# Patient Record
Sex: Female | Born: 1960 | Race: White | Hispanic: No | Marital: Single | Smoking: Never smoker
Health system: Southern US, Community
[De-identification: ages and names within clinical notes are randomized; demographics above are authoritative.]

## PROBLEM LIST (undated history)

## (undated) DIAGNOSIS — E119 Type 2 diabetes mellitus without complications: Secondary | ICD-10-CM

## (undated) DIAGNOSIS — J45909 Unspecified asthma, uncomplicated: Secondary | ICD-10-CM

## (undated) DIAGNOSIS — J302 Other seasonal allergic rhinitis: Secondary | ICD-10-CM

## (undated) DIAGNOSIS — C50919 Malignant neoplasm of unspecified site of unspecified female breast: Secondary | ICD-10-CM

## (undated) HISTORY — PX: KIDNEY STONE SURGERY: SHX686

## (undated) HISTORY — PX: WISDOM TOOTH EXTRACTION: SHX21

## (undated) HISTORY — PX: SINOSCOPY: SHX187

---

## 1999-11-28 ENCOUNTER — Other Ambulatory Visit: Admission: RE | Admit: 1999-11-28 | Discharge: 1999-11-28 | Payer: Self-pay | Admitting: Obstetrics and Gynecology

## 2000-12-05 ENCOUNTER — Other Ambulatory Visit: Admission: RE | Admit: 2000-12-05 | Discharge: 2000-12-05 | Payer: Self-pay | Admitting: Obstetrics and Gynecology

## 2001-08-11 ENCOUNTER — Encounter: Payer: Self-pay | Admitting: Otolaryngology

## 2001-08-11 ENCOUNTER — Encounter: Admission: RE | Admit: 2001-08-11 | Discharge: 2001-08-11 | Payer: Self-pay | Admitting: Otolaryngology

## 2001-08-27 ENCOUNTER — Ambulatory Visit (HOSPITAL_BASED_OUTPATIENT_CLINIC_OR_DEPARTMENT_OTHER): Admission: RE | Admit: 2001-08-27 | Discharge: 2001-08-27 | Payer: Self-pay | Admitting: Otolaryngology

## 2010-07-26 ENCOUNTER — Ambulatory Visit (HOSPITAL_BASED_OUTPATIENT_CLINIC_OR_DEPARTMENT_OTHER): Payer: BLUE CROSS/BLUE SHIELD

## 2010-08-15 ENCOUNTER — Ambulatory Visit (HOSPITAL_BASED_OUTPATIENT_CLINIC_OR_DEPARTMENT_OTHER): Payer: BLUE CROSS/BLUE SHIELD | Attending: Allergy and Immunology

## 2010-08-15 DIAGNOSIS — G4733 Obstructive sleep apnea (adult) (pediatric): Secondary | ICD-10-CM | POA: Insufficient documentation

## 2010-08-19 DIAGNOSIS — G4733 Obstructive sleep apnea (adult) (pediatric): Secondary | ICD-10-CM

## 2010-08-20 NOTE — Procedures (Signed)
NAME:  Stacy Shelton, Stacy Shelton NO.:  [id number]  MEDICAL RECORD NO.:  [id number]          PATIENT TYPE:  OUT  LOCATION:  SLEEP CENTER                 FACILITY:  [facility]  PHYSICIAN:  Janiel Crisostomo D. Maple Hudson, MD, FCCP, FACPDATE OF BIRTH:  Jul 15[birth year] 1960  DATE OF STUDY:  08/15/2010                           NOCTURNAL POLYSOMNOGRAM  REFERRING PHYSICIAN:  ERIC J KOZLOW  INDICATION FOR STUDY:  Hypersomnia with sleep apnea.  EPWORTH SLEEPINESS SCORE:  13/24.  BMI 33.8.  Weight 191 pounds, height 63 inches.  Neck 15 inches.  MEDICATIONS:  Home medications are charted and reviewed.  SLEEP ARCHITECTURE:  Split study protocol.  During the diagnostic phase, total sleep time 141.5 minutes with sleep efficiency 86%.  Stage I was 7.8%, stage II 92.2%, stages III and REM were absent.  Sleep latency 3 minutes, awake after sleep onset 12 minutes, arousal index 27.1.  BEDTIME MEDICATION:  None.  RESPIRATORY DATA:  Split study protocol.  Apnea/hypopnea index (AHI) 18.2 per hour.  A total of 43 events was scored all as hypopneas and most associated with supine sleep position.  CPAP was then titrated to 10 cwp, AHI 0 per hour.  She wore a small ResMed Quattro FX full-face mask with heated humidifier and C-Flex setting of 2.  OXYGEN DATA:  Before CPAP snoring was moderate to occasionally loud with oxygen desaturation to a nadir of 90%.  With CPAP titration mean oxygen saturation held 96.9% on room air and snoring was prevented.  CARDIAC DATA:  Sinus rhythm with PACs and PVCs.  MOVEMENT-PARASOMNIA:  No significant movement disturbance.  Bathroom times one.  IMPRESSION-RECOMMENDATIONS: 1. Moderate obstructive sleep apnea/hypopnea syndrome, AHI 18.2 per     hour, events were hypopneas associated with supine sleep position     and moderate to loud snoring with oxygen desaturation to a nadir of     90% for CPAP on room air. 2. Successful CPAP titration to 10 cwp, AHI 0 per hour.  She wore a  small ResMed Quattro FX full-face mask with heated humidifier at a     C-Flex setting of 2.  Snoring was prevented and oxygenation     improved.     Irbin Fines D. Maple Hudson, MD, [facility], FACP Diplomate, Biomedical engineer of Sleep Medicine Electronically Signed    CDY/MEDQ  D:  08/19/2010 13:20:07  T:  08/19/2010 21:47:57  Job:  [id number]

## 2015-02-21 ENCOUNTER — Other Ambulatory Visit: Payer: Self-pay | Admitting: Neurology

## 2015-02-21 MED ORDER — MONTELUKAST SODIUM 10 MG PO TABS: 10.0000 mg | ORAL_TABLET | Freq: Every day | ORAL | Status: DC

## 2015-04-25 ENCOUNTER — Other Ambulatory Visit: Payer: Self-pay

## 2015-04-26 ENCOUNTER — Other Ambulatory Visit: Payer: Self-pay

## 2015-04-26 MED ORDER — MONTELUKAST SODIUM 10 MG PO TABS: 10.0000 mg | ORAL_TABLET | Freq: Every day | ORAL | Status: DC

## 2015-05-06 ENCOUNTER — Encounter: Payer: Self-pay | Admitting: Allergy and Immunology

## 2015-05-06 ENCOUNTER — Ambulatory Visit (INDEPENDENT_AMBULATORY_CARE_PROVIDER_SITE_OTHER): Payer: BLUE CROSS/BLUE SHIELD | Admitting: Allergy and Immunology

## 2015-05-06 VITALS — BP 130/75 | HR 80 | Temp 98.5°F | Resp 18

## 2015-05-06 DIAGNOSIS — J4531 Mild persistent asthma with (acute) exacerbation: Secondary | ICD-10-CM

## 2015-05-06 DIAGNOSIS — J309 Allergic rhinitis, unspecified: Secondary | ICD-10-CM | POA: Diagnosis not present

## 2015-05-06 DIAGNOSIS — H1010 Acute atopic conjunctivitis, unspecified eye: Secondary | ICD-10-CM

## 2015-05-06 MED ORDER — AZITHROMYCIN 250 MG PO TABS: ORAL_TABLET | ORAL | Status: DC

## 2015-05-06 NOTE — Progress Notes (Signed)
FOLLOW UP NOTE  RE: FOREST OTEY MRN: [id number] DOB: [birth year] [facility] [place] [address] [id number] Date of Office Visit: 05/06/2015  Subjective:  Stacy Shelton is a 55 y.o. female who presents today for Cough; Nasal Congestion; and Sore Throat  Assessment:   1. Allergic rhinoconjunctivitis, associated postnasal drip.   2. Mild persistent asthma, with acute exacerbation, responsive to bronchodilator with clear lung exam and normal oxygenation.    3.      Recent respiratory infection, afebrile in no respiratory distress with known sick contacts. Plan:   Meds ordered this encounter  Medications  . azithromycin (ZITHROMAX Z-PAK) 250 MG tablet    Sig: Take 2 tablets today, then take one tablet on day 2, one tablet on day 3, one tablet on day 4, and one tablet on day 5.    Dispense:  6 each    Refill:  0   Patient Instructions  1. Begin prednisone  30 mg now and complete 30mg  over the next  3 days, then 20 mg one day and 10 mg on the last day. 2.  Consistently use Qvar 80 g 2 Puffs 3 times daily, for the next 10 days, then decrease to twice daily--until empty, then return to Qvar 40 g. 3.  Continue Singulair daily. 4.  Saline nasal wash once to twice daily. 5.  Rhinocort one spray once daily. 6.  Complete 5 days of azithromycin. 7.  Pro-air HFA every 4 hours as needed. 8.  Follow-up in  1-2  Month(s) or sooner if needed.  HPI: Stacy Shelton returns to the office with cough, congestion, postnasal drip, rhinorrhea and intermittent wheeze over the last week.  She notices intermittent clear phlegm production without headache, fever, sore throat, muscle aches, GI symptoms, shortness of breath or difficulty breathing.  She had received influenza vaccine but recalls recent sick contacts.  Denies any recurring difficulty since her last visit July 2016 including the any need for Qvar.  She only restarted the Qvar in the last 2 weeks one puff twice  daily without nasal spray or antihistamine given the season.  She denies any current reflux and has been consistent with those medications.  Denies ED or urgent care visits, prednisone or antibiotic courses. Reports sleep and activity are normal.  Stacy Shelton has a current medication list which includes the following prescription(s): albuterol, esomeprazole, montelukast, qvar, ranitidine, tobramycin.   Drug Allergies: Allergies  Allergen Reactions  . Nsaids    Objective:   Filed Vitals:   05/06/15 0853  BP: 130/75  Pulse: 80  Temp: 98.5 F (36.9 C)  Resp: 18   SpO2 Readings from Last 1 Encounters:  05/06/15 98%   Physical Exam  Constitutional: She is well-developed, well-nourished, and in no distress.  Communicating easily in full sentences with intermittent cough.  HENT:  Head: Atraumatic.  Right Ear: Tympanic membrane and ear canal normal.  Left Ear: Tympanic membrane and ear canal normal.  Nose: Mucosal edema and rhinorrhea (Scant clear mucus, noted septal perforation.) present. No epistaxis.  Mouth/Throat: Oropharynx is clear and moist and mucous membranes are normal. No oropharyngeal exudate, posterior oropharyngeal edema or posterior oropharyngeal erythema.  Neck: Neck supple.  Cardiovascular: Normal rate, S1 normal and S2 normal.   No murmur heard. Pulmonary/Chest: Effort normal. No accessory muscle usage. No respiratory distress. She has no wheezes (Post-Xopenex/Atrovent neb: Continues to be clear without adventitious breath sounds.  Patient reports improved). She has no rhonchi. She  has no rales.  Lymphadenopathy:    She has no cervical adenopathy.   Diagnostics: Spirometry:  FVC 2.39--80%, FEV1 2.12--87%; postbronchodilator improvement, FVC 2.77--93%, FEV1 2.46--101%.    Keval Nam M. Ishmael Holter, MD   cc: No PCP Per Patient

## 2015-05-06 NOTE — Patient Instructions (Signed)
   Begin prednisone  30 mg now and complete 30mg  over the next  3 days, then 20 mg one day and 10 mg on the last day.  Consistently use Qvar 80 g 2 Puffs 3 times daily, for the next 10 days, then decrease to twice daily.  Continue Singulair daily.  Saline nasal wash once to twice daily.  Rhinocort one spray once daily.  Complete 5 days of azithromycin.  Pro-air HFA every 4 hours as needed.  Follow-up in  1-2  Month(s) or sooner if needed.

## 2015-05-09 NOTE — Addendum Note (Signed)
Addended by: Angelica Ran on: 05/09/2015 12:14 PM   Modules accepted: Orders, SmartSet

## 2015-07-06 ENCOUNTER — Ambulatory Visit (INDEPENDENT_AMBULATORY_CARE_PROVIDER_SITE_OTHER): Payer: BLUE CROSS/BLUE SHIELD | Admitting: Allergy and Immunology

## 2015-07-06 ENCOUNTER — Encounter: Payer: Self-pay | Admitting: Allergy and Immunology

## 2015-07-06 VITALS — BP 118/70 | HR 84 | Temp 97.8°F | Resp 20 | Ht 62.0 in | Wt 175.3 lb

## 2015-07-06 DIAGNOSIS — J309 Allergic rhinitis, unspecified: Secondary | ICD-10-CM | POA: Diagnosis not present

## 2015-07-06 DIAGNOSIS — H1010 Acute atopic conjunctivitis, unspecified eye: Secondary | ICD-10-CM

## 2015-07-06 DIAGNOSIS — J4540 Moderate persistent asthma, uncomplicated: Secondary | ICD-10-CM | POA: Diagnosis not present

## 2015-07-06 MED ORDER — MONTELUKAST SODIUM 10 MG PO TABS: 10.0000 mg | ORAL_TABLET | Freq: Every day | ORAL | Status: DC

## 2015-07-06 MED ORDER — ALBUTEROL SULFATE HFA 108 (90 BASE) MCG/ACT IN AERS: 2.0000 | INHALATION_SPRAY | Freq: Four times a day (QID) | RESPIRATORY_TRACT | Status: DC | PRN

## 2015-07-06 NOTE — Progress Notes (Signed)
     FOLLOW UP NOTE  RE: Stacy Shelton MRN: [id number] DOB: 03/06/1960 [facility] [place] [address] [address] Date of Office Visit: 07/06/2015  Subjective:  Stacy Shelton is a 55 y.o. female who presents today for Asthma  Assessment:   1. Moderate persistent asthma.  2. Allergic rhinoconjunctivitis.   3.      History of GEreflux and likely reflux-induced respiratory disease, on daily PPI and H2 blocker. Plan:   Meds ordered this encounter  Medications  . albuterol (PROAIR HFA) 108 (90 Base) MCG/ACT inhaler    Sig: Inhale 2 puffs into the lungs every 6 (six) hours as needed for wheezing or shortness of breath.    Dispense:  1 Inhaler    Refill:  1  . montelukast (SINGULAIR) 10 MG tablet    Sig: Take 1 tablet (10 mg total) by mouth at bedtime.    Dispense:  90 tablet    Refill:  1  1.  Begin trial of Symbicort 64mcg 2 puffs twice daily, rinse gargle and spit with water. (sample only)--then return to QVAR 48mcg. 2.  Use Dymista 1 spray each nostril twice daily (sample)--then return to Rhinocort or Nasacort. 3.  Continue Singulair and Saline nasal wash. 4.  Add Claritin 5mg  once daily as needed. (Low dose to avoid nasal mucosa drying). 5.  Follow-up by phone as discussed and then in 4-6 months or sooner if needed.  HPI: Stacy Shelton returns to the office in follow-up of recent asthma flare (April 2017 visit) and respiratory infection--completed Prednisone and Zithromax.  She notes a great improvement though still having intermittent cough and slight nasal congestion/postnasal drip.  She is using albuterol at most twice a week, typically when outdoors more and heavier pollen or fluctuant weather pattern exposures.  She denies wheezing, shortness of breath, difficulty breathing, any exercise induced difficulty and maintains CPAP with her restful sleep. Denies ED or urgent care visits, prednisone or antibiotic courses. Reports sleep and activity are  normal.  Stacy Shelton has a current medication list which includes the following prescription(s): albuterol, esomeprazole, montelukast, qvar, ranitidine, and triamcinolone.   Drug Allergies: Allergies  Allergen Reactions  . Nsaids    Objective:   Filed Vitals:   07/06/15 1340  BP: 118/70  Pulse: 84  Temp: 97.8 F (36.6 C)  Resp: 20   SpO2 Readings from Last 1 Encounters:  05/06/15 98%   Physical Exam  Constitutional: She is well-developed, well-nourished, and in no distress.  Intermittent throat clearing.  HENT:  Head: Atraumatic.  Right Ear: Tympanic membrane and ear canal normal.  Left Ear: Tympanic membrane and ear canal normal.  Nose: Mucosal edema present. No rhinorrhea. No epistaxis.  Mouth/Throat: Oropharynx is clear and moist and mucous membranes are normal. No oropharyngeal exudate, posterior oropharyngeal edema or posterior oropharyngeal erythema.  Neck: Neck supple.  Cardiovascular: Normal rate, S1 normal and S2 normal.   No murmur heard. Pulmonary/Chest: Effort normal. She has no wheezes. She has no rhonchi. She has no rales.  Lymphadenopathy:    She has no cervical adenopathy.   Diagnostics: Spirometry:  FVC 2.70--- 95%, FEV1 2.45--- 105%.    Donyea Beverlin M. Ishmael Holter, MD  cc: No PCP Per Patient

## 2015-07-06 NOTE — Patient Instructions (Addendum)
    Begin trial of Symbicort 33mcg 2 puffs twice daily, rinse gargle and spit with water. (sample only)--then return to QVAR.  Use Dymista 1 spray each nostril twice daily (sample)--then return to Rhinocort or Nasacort.  Continue Singulair and Saline nasal wash.  Add Claritin 5mg  once daily as needed.  Follow-up by phone as discussed and then in 4-6 months or sooner if needed.

## 2015-08-05 ENCOUNTER — Other Ambulatory Visit: Payer: Self-pay

## 2015-08-05 MED ORDER — MONTELUKAST SODIUM 10 MG PO TABS: 10.0000 mg | ORAL_TABLET | Freq: Every day | ORAL | Status: DC

## 2016-04-12 ENCOUNTER — Encounter: Payer: Self-pay | Admitting: Allergy

## 2016-04-12 ENCOUNTER — Ambulatory Visit (INDEPENDENT_AMBULATORY_CARE_PROVIDER_SITE_OTHER): Payer: BLUE CROSS/BLUE SHIELD | Admitting: Allergy

## 2016-04-12 VITALS — BP 120/80 | HR 77 | Temp 98.1°F | Resp 16

## 2016-04-12 DIAGNOSIS — J0110 Acute frontal sinusitis, unspecified: Secondary | ICD-10-CM

## 2016-04-12 DIAGNOSIS — J309 Allergic rhinitis, unspecified: Secondary | ICD-10-CM

## 2016-04-12 DIAGNOSIS — H1010 Acute atopic conjunctivitis, unspecified eye: Secondary | ICD-10-CM | POA: Diagnosis not present

## 2016-04-12 DIAGNOSIS — J4540 Moderate persistent asthma, uncomplicated: Secondary | ICD-10-CM | POA: Diagnosis not present

## 2016-04-12 DIAGNOSIS — K219 Gastro-esophageal reflux disease without esophagitis: Secondary | ICD-10-CM | POA: Diagnosis not present

## 2016-04-12 MED ORDER — FLUTICASONE PROPIONATE HFA 44 MCG/ACT IN AERO: 2.0000 | INHALATION_SPRAY | Freq: Two times a day (BID) | RESPIRATORY_TRACT | 3 refills | Status: DC

## 2016-04-12 MED ORDER — ESOMEPRAZOLE MAGNESIUM 40 MG PO CPDR: 40.0000 mg | DELAYED_RELEASE_CAPSULE | Freq: Two times a day (BID) | ORAL | 1 refills | Status: DC

## 2016-04-12 MED ORDER — QVAR 40 MCG/ACT IN AERS: 2.0000 | INHALATION_SPRAY | Freq: Two times a day (BID) | RESPIRATORY_TRACT | 1 refills | Status: DC

## 2016-04-12 MED ORDER — MONTELUKAST SODIUM 10 MG PO TABS: 10.0000 mg | ORAL_TABLET | Freq: Every day | ORAL | 1 refills | Status: DC

## 2016-04-12 MED ORDER — RANITIDINE HCL 300 MG PO TABS: 300.0000 mg | ORAL_TABLET | Freq: Every day | ORAL | 1 refills | Status: DC

## 2016-04-12 MED ORDER — ALBUTEROL SULFATE HFA 108 (90 BASE) MCG/ACT IN AERS: 2.0000 | INHALATION_SPRAY | Freq: Four times a day (QID) | RESPIRATORY_TRACT | 1 refills | Status: DC | PRN

## 2016-04-12 MED ORDER — AMOXICILLIN-POT CLAVULANATE 875-125 MG PO TABS: 1.0000 | ORAL_TABLET | Freq: Two times a day (BID) | ORAL | 0 refills | Status: AC

## 2016-04-12 MED ORDER — BECLOMETHASONE DIPROP HFA 40 MCG/ACT IN AERB: 2.0000 | INHALATION_SPRAY | Freq: Two times a day (BID) | RESPIRATORY_TRACT | 0 refills | Status: DC

## 2016-04-12 NOTE — Addendum Note (Signed)
Addended by: Gara Kroner L on: 04/12/2016 02:55 PM   Modules accepted: Orders

## 2016-04-12 NOTE — Progress Notes (Signed)
Follow-up Note  RE: Stacy Shelton MRN: [id number] DOB: 06-Jun-1960 Date of Office Visit: 04/12/2016   History of present illness: Stacy Shelton is a 56 y.o. female presenting today for sick visit.  She was last seen in the office on 07/06/2015 by Dr. Ishmael Holter. She has a history of asthma, allergic rhinoconjunctivitis and reflux.  A little over a week ago she reports symptoms started which she attributed to her allergies as the pollen has been more prevalent.  She reports she has been blowing out thick greenish mucus and has been having a headache with pressure across her forehead since this weekend.  She also reports inability to taste or smell currently. She denies any fever.  She has been doing saline rinses now twice a day and is getting mucus out when she does these. She also tried using NyQuil over the weekend which she said tried her allergies she felt worse that she has not continued to take this.  She denies any sick contacts however her dad is in a nursing home which she visits him. She has continued to take her routine medications of singulair, Qvar 2 puffs twice a day, nexium, ranitidine and nasocort 2 sprays each nostril as previously prescribed.   She denies any need for albuterol use with this current infection. She states the last albuterol use was a few times over the fall. She has not required any oral steroids, ED or urgent care visits for her asthma.  Her allergies onset usually well controlled with use of Singulair and Nasacort. She states she normally does not need to use an antihistamine. Her reflux is controlled with her PPI and she also uses an H2 blocker   Review of systems: Review of Systems  Constitutional: Negative for chills, fever and malaise/fatigue.  HENT: Positive for congestion and sinus pain. Negative for ear discharge, ear pain, nosebleeds, sore throat and tinnitus.   Eyes: Negative for discharge and redness.  Respiratory: Negative for cough, shortness of breath and  wheezing.   Cardiovascular: Negative for chest pain.  Gastrointestinal: Negative for abdominal pain, heartburn, nausea and vomiting.  Musculoskeletal: Negative for joint pain and myalgias.  Skin: Negative for itching and rash.  Neurological: Positive for headaches. Negative for dizziness.    All other systems negative unless noted above in HPI  Past medical/social/surgical/family history have been reviewed and are unchanged unless specifically indicated below.  No changes  Medication List: Allergies as of 04/12/2016      Reactions   Nsaids       Medication List       Accurate as of 04/12/16 10:52 AM. Always use your most recent med list.          albuterol 108 (90 Base) MCG/ACT inhaler Commonly known as:  PROAIR HFA Inhale 2 puffs into the lungs every 6 (six) hours as needed for wheezing or shortness of breath.   esomeprazole 40 MG capsule Commonly known as:  NEXIUM Take 40 mg by mouth 2 (two) times daily before a meal.   montelukast 10 MG tablet Commonly known as:  SINGULAIR Take 1 tablet (10 mg total) by mouth at bedtime.   NASACORT ALLERGY 24HR 55 MCG/ACT Aero nasal inhaler Generic drug:  triamcinolone Place 2 sprays into the nose daily.   QVAR 40 MCG/ACT inhaler Generic drug:  beclomethasone INHALE 2 PUFFS TWICE DAILY TO PREVENT COUGH OR WHEEZE. RINSE, GARGLE, AND SPIT AFTER USE   ranitidine 300 MG tablet Commonly known as:  ZANTAC Take  300 mg by mouth at bedtime.       Known medication allergies: Allergies  Allergen Reactions  . Nsaids      Physical examination: Blood pressure 120/80, pulse 77, temperature 98.1 F (36.7 C), temperature source Oral, resp. rate 16, SpO2 98 %.  General: Alert, interactive, in no acute distress. HEENT: TMs pearly gray, turbinates moderately edematous with thick yellow-green discharge, post-pharynx non erythematous. Mild TTP across forehead.   Neck: Supple without lymphadenopathy. Lungs: Clear to auscultation without  wheezing, rhonchi or rales. {no increased work of breathing. CV: Normal S1, S2 without murmurs. Abdomen: Nondistended, nontender. Skin: Warm and dry, without lesions or rashes. Extremities:  No clubbing, cyanosis or edema. Neuro:   Grossly intact.  Diagnositics/Labs:  Spirometry: FEV1: 2.3L  99%, FVC: 2.64L  93%, ratio consistent with Nonobstructive pattern  Assessment and plan:   Acute sinusitis      - given duration and symptoms will treat for presumed bacterial sinus infection.      - take Augmentin 875mg  twice a day x 10 days      - take prednisone 20mg  (2 tabs) twice a day x 3 days then 20mg  x 1 day, then 10mg  (1 tab) x 1 day then stop.       - use Mucinex 600-1200mg  up to twice a day with plenty of water to help thin/loosen mucus      - continue nasal saline rinses  Allergic rhinoconjunctivitis Moderate persistent asthma, well-controlled GERD  - Continue routine medications: Singulair 10mg  daily, Qvar 2 puffs twice a day, albuterol as needed, Nasocort 2 sprays each nostril daily, and Nexium daily.  Follow-up this summer or sooner if needed.  Let us know if your symptoms are not improving by next week.    I appreciate the opportunity to take part in Stacy Shelton's care. Please do not hesitate to contact me with questions.  Sincerely,   Prudy Feeler, MD Allergy/Immunology [facility] [place] [facility] [place]

## 2016-04-12 NOTE — Patient Instructions (Addendum)
Acute sinusitis      - take Augmentin 875mg  twice a day x 10 days      - take prednisone 20mg  (2 tabs) twice a day x 3 days then 20mg  x 1 day, then 10mg  (1 tab) x 1 day then stop.       - use Mucinex 600-1200mg  up to twice a day with plenty of water to help thin/loosen mucus      - continue nasal saline rinses  Continue routine medications: Singulair 10mg  daily, Qvar 2 puffs twice a day, albuterol as needed, Nasocort 2 sprays each nostril daily, and Nexium daily.  Follow-up this summer or sooner if needed.  Let us know if your symptoms are not improving by next week.

## 2016-08-15 ENCOUNTER — Other Ambulatory Visit: Payer: Self-pay | Admitting: Allergy

## 2016-08-15 MED ORDER — FLUTICASONE PROPIONATE HFA 44 MCG/ACT IN AERO: 2.0000 | INHALATION_SPRAY | Freq: Two times a day (BID) | RESPIRATORY_TRACT | 1 refills | Status: DC

## 2016-08-15 NOTE — Telephone Encounter (Signed)
90 day supply of Flovent sent into pharmacy.

## 2016-08-15 NOTE — Telephone Encounter (Signed)
patient needs refill on FLOVENT for 90 days not 30 days Insurance is having an issue with this - they want a 90 days script written so they will pay for it

## 2016-10-04 ENCOUNTER — Other Ambulatory Visit: Payer: Self-pay | Admitting: Allergy

## 2016-10-04 DIAGNOSIS — K219 Gastro-esophageal reflux disease without esophagitis: Secondary | ICD-10-CM

## 2016-10-04 DIAGNOSIS — J4540 Moderate persistent asthma, uncomplicated: Secondary | ICD-10-CM

## 2017-02-02 ENCOUNTER — Other Ambulatory Visit: Payer: Self-pay | Admitting: Allergy

## 2017-02-02 DIAGNOSIS — J4540 Moderate persistent asthma, uncomplicated: Secondary | ICD-10-CM

## 2017-02-02 DIAGNOSIS — K219 Gastro-esophageal reflux disease without esophagitis: Secondary | ICD-10-CM

## 2017-02-04 ENCOUNTER — Other Ambulatory Visit: Payer: Self-pay

## 2017-02-04 DIAGNOSIS — K219 Gastro-esophageal reflux disease without esophagitis: Secondary | ICD-10-CM

## 2017-02-05 ENCOUNTER — Other Ambulatory Visit: Payer: Self-pay | Admitting: Allergy

## 2017-02-05 DIAGNOSIS — K219 Gastro-esophageal reflux disease without esophagitis: Secondary | ICD-10-CM

## 2017-05-16 ENCOUNTER — Ambulatory Visit: Payer: BLUE CROSS/BLUE SHIELD | Admitting: Allergy

## 2017-05-16 ENCOUNTER — Encounter: Payer: Self-pay | Admitting: Allergy

## 2017-05-16 VITALS — BP 118/72 | HR 98 | Resp 17 | Ht 62.0 in | Wt 172.6 lb

## 2017-05-16 DIAGNOSIS — J0110 Acute frontal sinusitis, unspecified: Secondary | ICD-10-CM | POA: Diagnosis not present

## 2017-05-16 DIAGNOSIS — K219 Gastro-esophageal reflux disease without esophagitis: Secondary | ICD-10-CM

## 2017-05-16 DIAGNOSIS — H1010 Acute atopic conjunctivitis, unspecified eye: Secondary | ICD-10-CM

## 2017-05-16 DIAGNOSIS — J309 Allergic rhinitis, unspecified: Secondary | ICD-10-CM

## 2017-05-16 DIAGNOSIS — J4540 Moderate persistent asthma, uncomplicated: Secondary | ICD-10-CM

## 2017-05-16 MED ORDER — ALBUTEROL SULFATE HFA 108 (90 BASE) MCG/ACT IN AERS: 2.0000 | INHALATION_SPRAY | Freq: Four times a day (QID) | RESPIRATORY_TRACT | 1 refills | Status: AC | PRN

## 2017-05-16 MED ORDER — ESOMEPRAZOLE MAGNESIUM 40 MG PO CPDR: 40.0000 mg | DELAYED_RELEASE_CAPSULE | Freq: Two times a day (BID) | ORAL | 1 refills | Status: DC

## 2017-05-16 MED ORDER — MONTELUKAST SODIUM 10 MG PO TABS: 10.0000 mg | ORAL_TABLET | Freq: Every day | ORAL | 0 refills | Status: DC

## 2017-05-16 MED ORDER — AMOXICILLIN-POT CLAVULANATE 875-125 MG PO TABS: 1.0000 | ORAL_TABLET | Freq: Two times a day (BID) | ORAL | 0 refills | Status: AC

## 2017-05-16 MED ORDER — RANITIDINE HCL 300 MG PO TABS: 300.0000 mg | ORAL_TABLET | Freq: Every day | ORAL | 1 refills | Status: DC

## 2017-05-16 MED ORDER — FLUTICASONE PROPIONATE HFA 44 MCG/ACT IN AERO: 2.0000 | INHALATION_SPRAY | Freq: Two times a day (BID) | RESPIRATORY_TRACT | 1 refills | Status: AC

## 2017-05-16 NOTE — Patient Instructions (Addendum)
Acute sinusitis      - take prednisone 20mg  (2 tabs) twice a day x 3 days then 20mg  x 1 day, then 10mg  (1 tab) x 1 day then stop.       - if not improved in 2 days with use of prednisone please fill Augmentin 875mg  twice a day x 10 days      - may use Mucinex 600-1200mg  up to twice a day with plenty of water to help thin/loosen mucus      - continue nasal saline rinses  Allergic rhinoconjunctivitis, asthma and reflux Continue routine medications: Singulair 10mg  daily, Flovent 2 puffs twice a day, albuterol as needed, Nasocort 2 sprays each nostril daily and Nexium daily.      trial dymista 1 spray each nostril twice a day.  This is a combination nasal spray with Flonase (similar to Nasocort) + Astelin (nasal antihistamine).  This helps with both nasal congestion and drainage.    Follow-up in 6 months or sooner if needed.  Let us know if your symptoms are not improving by next week.

## 2017-05-16 NOTE — Progress Notes (Signed)
Follow-up Note  RE: Stacy Shelton MRN: [id number] DOB: [birth year] Date of Office Visit: 05/16/2017   History of present illness: Stacy Shelton is a 57 y.o. female presenting today for follow-up/sick visit of allergic rhinoconjunctivitis, asthma, gerd.  She was last seen in the office on 04/12/16 by myself at which time she was treated for acute sinusitis with prednisone and augmentin course.  She states she was having a good year unp tile about 4 weeks ago.  She states she has been having worsening symptoms every week.  Symptoms are hadeach, nasal congestion and drainage, sore throat, cough, facial puffiness, pressure feeling through cheeks and forehead.  All symptoms are persistent throughout the day.  Denies any fevers.  She had been taking singulair but ran out in January.  She has been taking flovent 2 puffs twice a day and nexium twice a day.  She has needed to use albuterol twice in the past week for relief of symptoms.  Besides last week she had not needed to use albuterol as far as she can remember.  She has not used any nasal sprays (nasocort - believes may be expired).  She will use ranitidine if reflux symptoms not controlled with nexium.   She states she was started on a new medication for prevention of macular degeneration (parental family history) since her last visit.    Review of systems: Review of Systems  Constitutional: Negative for fever, malaise/fatigue and weight loss.  HENT: Positive for congestion, sinus pain and sore throat. Negative for ear discharge, ear pain, nosebleeds and tinnitus.   Eyes: Negative for pain, discharge and redness.  Respiratory: Positive for cough.   Cardiovascular: Negative for chest pain.  Gastrointestinal: Negative for abdominal pain, constipation, diarrhea, nausea and vomiting.  Musculoskeletal: Negative for joint pain.  Skin: Negative for itching and rash.  Neurological: Positive for headaches.    All other systems negative unless noted above  in HPI  Past medical/social/surgical/family history have been reviewed and are unchanged unless specifically indicated below.  No changes  Medication List: Allergies as of 05/16/2017      Reactions   Nsaids       Medication List        Accurate as of 05/16/17  6:27 PM. Always use your most recent med list.          albuterol 108 (90 Base) MCG/ACT inhaler Commonly known as:  PROAIR HFA Inhale 2 puffs into the lungs every 6 (six) hours as needed for wheezing or shortness of breath.   amoxicillin-clavulanate 875-125 MG tablet Commonly known as:  AUGMENTIN Take 1 tablet by mouth 2 (two) times daily for 10 days.   esomeprazole 40 MG capsule Commonly known as:  NEXIUM Take 1 capsule (40 mg total) by mouth 2 (two) times daily before a meal.   fluticasone 44 MCG/ACT inhaler Commonly known as:  FLOVENT HFA Inhale 2 puffs into the lungs 2 (two) times daily.   montelukast 10 MG tablet Commonly known as:  SINGULAIR Take 1 tablet (10 mg total) by mouth at bedtime.   NASACORT ALLERGY 24HR 55 MCG/ACT Aero nasal inhaler Generic drug:  triamcinolone Place 2 sprays into the nose daily.   ranitidine 300 MG tablet Commonly known as:  ZANTAC Take 1 tablet (300 mg total) by mouth at bedtime.       Known medication allergies: Allergies  Allergen Reactions  . Nsaids      Physical examination: Blood pressure 118/72, pulse 98, resp. rate 17,  height 5\' 2"  (1.575 m), weight 172 lb 9.6 oz (78.3 kg), SpO2 95 %.  General: Alert, interactive, in no acute distress. HEENT: PERRLA, TMs pearly gray, turbinates moderately edematous without discharge, post-pharynx non erythematous. Neck: Supple without lymphadenopathy. Lungs: Clear to auscultation without wheezing, rhonchi or rales. {no increased work of breathing. CV: Normal S1, S2 without murmurs. Abdomen: Nondistended, nontender. Skin: Warm and dry, without lesions or rashes. Extremities:  No clubbing, cyanosis or edema. Neuro:    Grossly intact.  Diagnositics/Labs:  Spirometry: FEV1: 2.35L  98%, FVC: 2.64L  87%, ratio consistent with nonobstructive pattern  Assessment and plan:   Acute sinusitis, frontal      - take prednisone 20mg  (2 tabs) twice a day x 3 days then 20mg  x 1 day, then 10mg  (1 tab) x 1 day then stop.       - if not improved in 2 days with use of prednisone please fill Augmentin 875mg  twice a day x 10 days      - may use Mucinex 600-1200mg  up to twice a day with plenty of water to help thin/loosen mucus      - continue nasal saline rinses  Allergic rhinoconjunctivitis, asthma and reflux Continue routine medications: Singulair 10mg  daily, Flovent 2 puffs twice a day, albuterol as needed, Nasocort 2 sprays each nostril daily and Nexium daily.      Trial dymista 1 spray each nostril twice a day.  This is a combination nasal spray with Flonase (similar to Nasocort) + Astelin (nasal antihistamine).  This helps with both nasal congestion and drainage.   Follow-up in 6 months or sooner if needed.  Let us know if your symptoms are not improving by next week.    I appreciate the opportunity to take part in Lailah's care. Please do not hesitate to contact me with questions.  Sincerely,   Prudy Feeler, MD Allergy/Immunology [facility] [place] [facility] [place]

## 2017-08-12 ENCOUNTER — Other Ambulatory Visit: Payer: Self-pay | Admitting: Allergy

## 2017-08-12 DIAGNOSIS — J4540 Moderate persistent asthma, uncomplicated: Secondary | ICD-10-CM

## 2018-01-01 ENCOUNTER — Ambulatory Visit: Payer: BLUE CROSS/BLUE SHIELD | Admitting: Allergy

## 2018-01-01 ENCOUNTER — Encounter: Payer: Self-pay | Admitting: Allergy

## 2018-01-01 VITALS — BP 124/62 | HR 94 | Temp 98.4°F | Resp 22 | Ht 62.5 in | Wt 167.4 lb

## 2018-01-01 DIAGNOSIS — J4541 Moderate persistent asthma with (acute) exacerbation: Secondary | ICD-10-CM

## 2018-01-01 DIAGNOSIS — K219 Gastro-esophageal reflux disease without esophagitis: Secondary | ICD-10-CM

## 2018-01-01 DIAGNOSIS — J3089 Other allergic rhinitis: Secondary | ICD-10-CM

## 2018-01-01 NOTE — Patient Instructions (Addendum)
Allergic rhinoconjunctivitis, asthma and reflux - Recent viral upper respiratory tract infection with improvement in symptoms except for cough which has exacerbated asthma.   - start prednisone pack as directed 20mg  twice a day x 3 days, 20mg  x 1 day and 10mg  x 1 day.   - at this time hold Flovent and replace with Symbicort 160mg  2 puffs twice a day.  Once symptoms have improved can stop and resume Flovent 2 puffs twice a day.   Continue routine medications: Singulair 10mg  daily, albuterol as needed, Nasocort 2 sprays each nostril daily and Nexium daily.    Follow-up in 6 months or sooner if needed.

## 2018-01-01 NOTE — Progress Notes (Signed)
Follow-up Note  RE: Stacy Shelton MRN: [id number] DOB: 1960/04/18 Date of Office Visit: 01/01/2018   History of present illness: Stacy Shelton is a 57 y.o. female presenting today for sick visit.  She was last seen in the office on May 16, 2017 at which time she also was sick with an acute sinusitis.  She was treated with steroids.  She states she recovered from that illness and was doing well for a very long time up until a coworker came to work sick about a week ago.  She states everyone in the office is now sick as well.  She states initially she had 3 days of nasal congestion, postnasal drip headache.  She did not have any fever.  The symptoms went away but she had a continued cough that continues to linger and is disruptive she states at work.  Cough is productive of yellow-greenish colored mucus.  She states she has been using her albuterol about every 4-6 hours throughout the day and not getting much relief.  She also continues to take her Flovent 2 puffs twice a day.  She is using Nasacort for nasal symptoms as well as taking singular daily.    I did have her try Dymista sample after last visit but she states this made her develop a sore throat and thus she stopped it. She states she has to go to a funeral tomorrow and will require airplane travel.    Review of systems: Review of Systems  Constitutional: Negative for chills, fever and malaise/fatigue.  HENT: Positive for congestion and sinus pain. Negative for ear discharge, ear pain, nosebleeds and sore throat.   Eyes: Negative for pain, discharge and redness.  Respiratory: Positive for cough and sputum production. Negative for shortness of breath and wheezing.   Cardiovascular: Negative for chest pain.  Gastrointestinal: Negative for abdominal pain, constipation, diarrhea, heartburn, nausea and vomiting.  Musculoskeletal: Negative for joint pain and myalgias.  Skin: Negative for itching and rash.  Neurological: Positive for  headaches. Negative for dizziness.    All other systems negative unless noted above in HPI  Past medical/social/surgical/family history have been reviewed and are unchanged unless specifically indicated below.  No changes  Medication List: Allergies as of 01/01/2018      Reactions   Nsaids       Medication List        Accurate as of 01/01/18  1:42 PM. Always use your most recent med list.          albuterol 108 (90 Base) MCG/ACT inhaler Commonly known as:  PROVENTIL HFA;VENTOLIN HFA Inhale 2 puffs into the lungs every 6 (six) hours as needed for wheezing or shortness of breath.   esomeprazole 40 MG capsule Commonly known as:  NEXIUM Take 1 capsule (40 mg total) by mouth 2 (two) times daily before a meal.   fluticasone 44 MCG/ACT inhaler Commonly known as:  FLOVENT HFA Inhale 2 puffs into the lungs 2 (two) times daily.   montelukast 10 MG tablet Commonly known as:  SINGULAIR TAKE 1 TABLET BY MOUTH EVERYDAY AT BEDTIME   NASACORT ALLERGY 24HR 55 MCG/ACT Aero nasal inhaler Generic drug:  triamcinolone Place 2 sprays into the nose daily.   ranitidine 300 MG tablet Commonly known as:  ZANTAC Take 1 tablet (300 mg total) by mouth at bedtime.       Known medication allergies: Allergies  Allergen Reactions  . Nsaids      Physical examination: Blood pressure 124/62,  pulse 94, temperature 98.4 F (36.9 C), temperature source Oral, resp. rate (!) 22, height 5' 2.5" (1.588 m), weight 167 lb 6.4 oz (75.9 kg), SpO2 97 %.  General: Alert, interactive, in no acute distress. HEENT: PERRLA, TMs pearly gray, turbinates mildly edematous without discharge, post-pharynx non erythematous. Neck: Supple without lymphadenopathy. Lungs: Clear to auscultation without wheezing, rhonchi or rales. {no increased work of breathing.  Lots of coughing throughout the encounter CV: Normal S1, S2 without murmurs. Abdomen: Nondistended, nontender. Skin: Warm and dry, without lesions or  rashes. Extremities:  No clubbing, cyanosis or edema. Neuro:   Grossly intact.  Diagnositics/Labs:  Spirometry: FEV1: 2.34L 92%, FVC: 2.62L 80%, ratio consistent with Nonobstructive pattern  Assessment and plan:   Allergic rhinitis with conjunctivitis, asthma and reflux - Recent viral upper respiratory tract infection with improvement in symptoms except for cough which has exacerbated asthma.   - start prednisone pack as directed 20mg  twice a day x 3 days, 20mg  x 1 day and 10mg  x 1 day.   - at this time hold Flovent and replace with Symbicort 160mg  2 puffs twice a day.  Once symptoms have improved can stop and resume Flovent 2 puffs twice a day.   Continue routine medications: Singulair 10mg  daily, albuterol as needed, Nasocort 2 sprays each nostril daily and Nexium daily.    Follow-up in 6 months or sooner if needed.    I appreciate the opportunity to take part in Amarya's care. Please do not hesitate to contact me with questions.  Sincerely,   Prudy Feeler, MD Allergy/Immunology [facility]

## 2018-01-01 NOTE — Addendum Note (Signed)
Addended by: Lucrezia Starch I on: 01/01/2018 04:22 PM   Modules accepted: Orders

## 2018-03-10 ENCOUNTER — Encounter (HOSPITAL_COMMUNITY): Payer: Self-pay

## 2018-03-10 ENCOUNTER — Inpatient Hospital Stay (HOSPITAL_COMMUNITY)
Admission: EM | Admit: 2018-03-10 | Discharge: 2018-03-13 | DRG: 598 | Disposition: A | Discharge status: Home / Self Care | Payer: BLUE CROSS/BLUE SHIELD | Attending: Internal Medicine | Admitting: Internal Medicine

## 2018-03-10 DIAGNOSIS — C7802 Secondary malignant neoplasm of left lung: Secondary | ICD-10-CM | POA: Diagnosis present

## 2018-03-10 DIAGNOSIS — D61818 Other pancytopenia: Secondary | ICD-10-CM | POA: Diagnosis present

## 2018-03-10 DIAGNOSIS — C7931 Secondary malignant neoplasm of brain: Secondary | ICD-10-CM | POA: Diagnosis present

## 2018-03-10 DIAGNOSIS — C787 Secondary malignant neoplasm of liver and intrahepatic bile duct: Secondary | ICD-10-CM | POA: Diagnosis present

## 2018-03-10 DIAGNOSIS — J4540 Moderate persistent asthma, uncomplicated: Secondary | ICD-10-CM | POA: Diagnosis present

## 2018-03-10 DIAGNOSIS — C7951 Secondary malignant neoplasm of bone: Secondary | ICD-10-CM | POA: Diagnosis present

## 2018-03-10 DIAGNOSIS — C50512 Malignant neoplasm of lower-outer quadrant of left female breast: Principal | ICD-10-CM | POA: Diagnosis present

## 2018-03-10 DIAGNOSIS — Z886 Allergy status to analgesic agent status: Secondary | ICD-10-CM

## 2018-03-10 DIAGNOSIS — E119 Type 2 diabetes mellitus without complications: Secondary | ICD-10-CM

## 2018-03-10 DIAGNOSIS — I10 Essential (primary) hypertension: Secondary | ICD-10-CM | POA: Diagnosis present

## 2018-03-10 DIAGNOSIS — E1165 Type 2 diabetes mellitus with hyperglycemia: Secondary | ICD-10-CM | POA: Diagnosis present

## 2018-03-10 DIAGNOSIS — Z79899 Other long term (current) drug therapy: Secondary | ICD-10-CM

## 2018-03-10 DIAGNOSIS — G4733 Obstructive sleep apnea (adult) (pediatric): Secondary | ICD-10-CM | POA: Diagnosis present

## 2018-03-10 DIAGNOSIS — D696 Thrombocytopenia, unspecified: Secondary | ICD-10-CM | POA: Diagnosis not present

## 2018-03-10 DIAGNOSIS — C50919 Malignant neoplasm of unspecified site of unspecified female breast: Secondary | ICD-10-CM

## 2018-03-10 DIAGNOSIS — D649 Anemia, unspecified: Secondary | ICD-10-CM

## 2018-03-10 DIAGNOSIS — Z7951 Long term (current) use of inhaled steroids: Secondary | ICD-10-CM

## 2018-03-10 DIAGNOSIS — C7801 Secondary malignant neoplasm of right lung: Secondary | ICD-10-CM | POA: Diagnosis present

## 2018-03-10 LAB — CBC WITH DIFFERENTIAL/PLATELET
ABS IMMATURE GRANULOCYTES: 0.84 10*3/uL — AB (ref 0.00–0.07)
Basophils Absolute: 0.2 10*3/uL — ABNORMAL HIGH (ref 0.0–0.1)
Basophils Relative: 2 %
Eosinophils Absolute: 0 10*3/uL (ref 0.0–0.5)
Eosinophils Relative: 0 %
HCT: 20.5 % — ABNORMAL LOW (ref 36.0–46.0)
Hemoglobin: 6.4 g/dL — CL (ref 12.0–15.0)
Immature Granulocytes: 10 %
Lymphocytes Relative: 38 %
Lymphs Abs: 3.2 10*3/uL (ref 0.7–4.0)
MCH: 27 pg (ref 26.0–34.0)
MCHC: 31.2 g/dL (ref 30.0–36.0)
MCV: 86.5 fL (ref 80.0–100.0)
MONOS PCT: 11 %
Monocytes Absolute: 0.9 10*3/uL (ref 0.1–1.0)
Neutro Abs: 3.3 10*3/uL (ref 1.7–7.7)
Neutrophils Relative %: 39 %
Platelets: 74 10*3/uL — ABNORMAL LOW (ref 150–400)
RBC: 2.37 MIL/uL — ABNORMAL LOW (ref 3.87–5.11)
RDW: 24.6 % — ABNORMAL HIGH (ref 11.5–15.5)
WBC: 8.5 10*3/uL (ref 4.0–10.5)
nRBC: 4.8 % — ABNORMAL HIGH (ref 0.0–0.2)

## 2018-03-10 LAB — URINALYSIS, ROUTINE W REFLEX MICROSCOPIC
BACTERIA UA: NONE SEEN
Bilirubin Urine: NEGATIVE
Glucose, UA: >=500 mg/dL — AB
Hgb urine dipstick: NEGATIVE
Ketones, ur: 80 mg/dL — AB
Leukocytes, UA: NEGATIVE
Nitrite: NEGATIVE
Protein, ur: NEGATIVE mg/dL
Specific Gravity, Urine: 1.028 (ref 1.005–1.030)
pH: 5 (ref 5.0–8.0)

## 2018-03-10 LAB — POC OCCULT BLOOD, ED: Fecal Occult Bld: NEGATIVE

## 2018-03-10 LAB — BLOOD GAS, VENOUS
Acid-base deficit: 0.6 mmol/L (ref 0.0–2.0)
Bicarbonate: 23.2 mmol/L (ref 20.0–28.0)
FIO2: 21
O2 Saturation: 90.2 %
PO2 VEN: 56.2 mmHg — AB (ref 32.0–45.0)
Patient temperature: 98.6
pCO2, Ven: 36.8 mmHg — ABNORMAL LOW (ref 44.0–60.0)
pH, Ven: 7.416 (ref 7.250–7.430)

## 2018-03-10 LAB — COMPREHENSIVE METABOLIC PANEL
ALT: 13 U/L (ref 0–44)
AST: 14 U/L — AB (ref 15–41)
Albumin: 3.7 g/dL (ref 3.5–5.0)
Alkaline Phosphatase: 78 U/L (ref 38–126)
Anion gap: 15 (ref 5–15)
BUN: 15 mg/dL (ref 6–20)
CO2: 21 mmol/L — ABNORMAL LOW (ref 22–32)
Calcium: 9.1 mg/dL (ref 8.9–10.3)
Chloride: 94 mmol/L — ABNORMAL LOW (ref 98–111)
Creatinine, Ser: 0.68 mg/dL (ref 0.44–1.00)
GFR calc Af Amer: >60 mL/min (ref >60)
GFR calc non Af Amer: >60 mL/min (ref >60)
GLUCOSE: 433 mg/dL — AB (ref 70–99)
Potassium: 3.4 mmol/L — ABNORMAL LOW (ref 3.5–5.1)
Sodium: 130 mmol/L — ABNORMAL LOW (ref 135–145)
Total Bilirubin: 1.2 mg/dL (ref 0.3–1.2)
Total Protein: 7.1 g/dL (ref 6.5–8.1)

## 2018-03-10 LAB — CBG MONITORING, ED: Glucose-Capillary: 431 mg/dL — ABNORMAL HIGH (ref 70–99)

## 2018-03-10 MED ORDER — SODIUM CHLORIDE 0.9 % IV BOLUS
1000.0000 mL | Freq: Once | INTRAVENOUS | Status: AC
  Administered 2018-03-10: 1000 mL via INTRAVENOUS

## 2018-03-10 MED ORDER — SODIUM CHLORIDE 0.9 % IV SOLN
10.0000 mL/h | Freq: Once | INTRAVENOUS | Status: AC
  Administered 2018-03-11: 10 mL/h via INTRAVENOUS

## 2018-03-10 NOTE — ED Provider Notes (Signed)
[facility] DEPT Provider Note   CSN: [id number] Arrival date & time: 03/10/18  2046     History   Chief Complaint Chief Complaint  Patient presents with  . Hyperglycemia    HPI Stacy Shelton is a 58 y.o. female.  58 year old female presents to the emergency department for evaluation of hyperglycemia.  She saw her primary care doctor today after she has been experiencing palpitations for approximately 1 week.  She describes her palpitations as a sensation of a rapid heartbeat.  She has noticed her heartbeat pulsating in her ear when she goes to sleep.  She has been feeling generally fatigued with both polyuria and polydipsia.  She was called by her provider after office visit and was informed that her sugar was over 600 and to seek evaluation in the ED.  She has no prior history of diabetes.  Did use a course of steroids in December for suspected asthma exacerbation, but denies any other recent steroid use.  She states that she has been feeling generally unwell since "a virus in November".  No recent fevers, N/V, chest pain.  The history is provided by the patient. No language interpreter was used.  Hyperglycemia    History reviewed. No pertinent past medical history.  Patient Active Problem List   Diagnosis Date Noted  . Symptomatic anemia 03/11/2018  . Thrombocytopenia ([place]) 03/11/2018  . Uncontrolled diabetes mellitus with hyperglycemia ([place]) 03/11/2018  . Essential hypertension 03/11/2018    Past Surgical History:  Procedure Laterality Date  . KIDNEY STONE SURGERY    . SINOSCOPY    . WISDOM TOOTH EXTRACTION       OB History   No obstetric history on file.      Home Medications    Prior to Admission medications   Medication Sig Start Date End Date Taking? Authorizing Provider  albuterol (PROAIR HFA) 108 (90 Base) MCG/ACT inhaler Inhale 2 puffs into the lungs every 6 (six) hours as needed for wheezing or shortness of breath. 05/16/17   Yes Padgett, Rae Halsted, MD  cyclobenzaprine (FLEXERIL) 10 MG tablet Take 10 mg by mouth daily as needed for muscle spasms.  01/26/18  Yes [provider]  esomeprazole (NEXIUM) 40 MG capsule Take 1 capsule (40 mg total) by mouth 2 (two) times daily before a meal. 05/16/17  Yes Padgett, Rae Halsted, MD  fluticasone (FLOVENT HFA) 44 MCG/ACT inhaler Inhale 2 puffs into the lungs 2 (two) times daily. 05/16/17  Yes Padgett, Rae Halsted, MD  metoprolol succinate (TOPROL-XL) 50 MG 24 hr tablet Take 50 mg by mouth every evening. 03/10/18  Yes [provider]  montelukast (SINGULAIR) 10 MG tablet TAKE 1 TABLET BY MOUTH EVERYDAY AT BEDTIME Patient taking differently: Take 10 mg by mouth daily.  08/12/17  Yes Padgett, Rae Halsted, MD  Multiple Vitamin (MULTIVITAMIN WITH MINERALS) TABS tablet Take 1 tablet by mouth daily.   Yes [provider]  ranitidine (ZANTAC) 300 MG tablet Take 1 tablet (300 mg total) by mouth at bedtime. Patient taking differently: Take 300 mg by mouth at bedtime as needed for heartburn.  05/16/17  Yes Padgett, Rae Halsted, MD  triamcinolone (NASACORT ALLERGY 24HR) 55 MCG/ACT AERO nasal inhaler Place 2 sprays into the nose daily.   Yes [provider]    Family History Family History  Problem Relation Age of Onset  . Allergic rhinitis Neg Hx   . Angioedema Neg Hx   . Asthma Neg Hx   . Atopy Neg  Hx   . Eczema Neg Hx   . Immunodeficiency Neg Hx   . Urticaria Neg Hx     Social History Social History   Tobacco Use  . Smoking status: Never Smoker  . Smokeless tobacco: Never Used  Substance Use Topics  . Alcohol use: Yes    Alcohol/week: 0.0 standard drinks  . Drug use: No     Allergies   Nsaids   Review of Systems Review of Systems Ten systems reviewed and are negative for acute change, except as noted in the HPI.    Physical Exam Updated Vital Signs BP (!) 164/75 (BP Location: Right Arm)   Pulse (!) 116    Temp 97.9 F (36.6 C) (Oral)   Resp 18   Ht 5\' 2"  (1.575 m)   Wt 68.9 kg   SpO2 94%   BMI 27.80 kg/m   Physical Exam Vitals signs and nursing note reviewed.  Constitutional:      General: She is not in acute distress.    Appearance: She is well-developed. She is not diaphoretic.     Comments: Alert, pleasant.  Patient in no distress.  HENT:     Head: Normocephalic and atraumatic.  Eyes:     General: No scleral icterus.    Conjunctiva/sclera: Conjunctivae normal.  Neck:     Musculoskeletal: Normal range of motion.  Cardiovascular:     Rate and Rhythm: Normal rate and regular rhythm.     Pulses: Normal pulses.     Comments: Not tachycardic as noted in triage Pulmonary:     Effort: Pulmonary effort is normal. No respiratory distress.     Breath sounds: No stridor. No wheezing, rhonchi or rales.     Comments: Lungs clear to auscultation bilaterally. Genitourinary:    Comments: Normal rectal tone. No gross blood or melena on DRE. Musculoskeletal: Normal range of motion.  Skin:    General: Skin is warm and dry.     Coloration: Skin is not pale.     Findings: No erythema or rash.  Neurological:     Mental Status: She is alert and oriented to person, place, and time.     Coordination: Coordination normal.     Comments: GCS 15. Moving all extremities.  Psychiatric:        Behavior: Behavior normal.      ED Treatments / Results  Labs (all labs ordered are listed, but only abnormal results are displayed) Labs Reviewed  CBC WITH DIFFERENTIAL/PLATELET - Abnormal; Notable for the following components:      Result Value   RBC 2.37 (*)    Hemoglobin 6.4 (*)    HCT 20.5 (*)    RDW 24.6 (*)    Platelets 74 (*)    nRBC 4.8 (*)    Basophils Absolute 0.2 (*)    Abs Immature Granulocytes 0.84 (*)    All other components within normal limits  COMPREHENSIVE METABOLIC PANEL - Abnormal; Notable for the following components:   Sodium 130 (*)    Potassium 3.4 (*)    Chloride  94 (*)    CO2 21 (*)    Glucose, Bld 433 (*)    AST 14 (*)    All other components within normal limits  BLOOD GAS, VENOUS - Abnormal; Notable for the following components:   pCO2, Ven 36.8 (*)    pO2, Ven 56.2 (*)    All other components within normal limits  URINALYSIS, ROUTINE W REFLEX MICROSCOPIC - Abnormal; Notable for the following  components:   Glucose, UA >=500 (*)    Ketones, ur 80 (*)    All other components within normal limits  CBG MONITORING, ED - Abnormal; Notable for the following components:   Glucose-Capillary 431 (*)    All other components within normal limits  VITAMIN B12  FOLATE  IRON AND TIBC  FERRITIN  RETICULOCYTES  HIV ANTIBODY (ROUTINE TESTING W REFLEX)  BASIC METABOLIC PANEL  POC OCCULT BLOOD, ED  CBG MONITORING, ED  PREPARE RBC (CROSSMATCH)  TYPE AND SCREEN    EKG None  Radiology No results found.  Procedures Procedures (including critical care time)  Medications Ordered in ED Medications  0.9 %  sodium chloride infusion (has no administration in time range)  metoprolol succinate (TOPROL-XL) 24 hr tablet 50 mg (has no administration in time range)  pantoprazole (PROTONIX) EC tablet 40 mg (has no administration in time range)  cyclobenzaprine (FLEXERIL) tablet 10 mg (has no administration in time range)  albuterol (PROVENTIL HFA;VENTOLIN HFA) 108 (90 Base) MCG/ACT inhaler 2 puff (has no administration in time range)  fluticasone (FLOVENT HFA) 44 MCG/ACT inhaler 2 puff (has no administration in time range)  montelukast (SINGULAIR) tablet 10 mg (has no administration in time range)  sodium chloride flush (NS) 0.9 % injection 3 mL (has no administration in time range)  sodium chloride flush (NS) 0.9 % injection 3 mL (has no administration in time range)  sodium chloride flush (NS) 0.9 % injection 3 mL (has no administration in time range)  0.9 %  sodium chloride infusion (has no administration in time range)  acetaminophen (TYLENOL) tablet  650 mg (has no administration in time range)    Or  acetaminophen (TYLENOL) suppository 650 mg (has no administration in time range)  senna-docusate (Senokot-S) tablet 1 tablet (has no administration in time range)  ondansetron (ZOFRAN) tablet 4 mg (has no administration in time range)    Or  ondansetron (ZOFRAN) injection 4 mg (has no administration in time range)  insulin aspart (novoLOG) injection 0-9 Units (has no administration in time range)  insulin aspart (novoLOG) injection 0-5 Units (has no administration in time range)  0.9 % NaCl with KCl 20 mEq/ L  infusion (has no administration in time range)  potassium chloride SA (K-DUR,KLOR-CON) CR tablet 20 mEq (has no administration in time range)  sodium chloride 0.9 % bolus 1,000 mL (1,000 mLs Intravenous New Bag/Given 03/10/18 2238)    11:50 PM Hgb 6.4. Noted to be 7.6 in the office today with PCP. Also thrombocytopenic. Symptomatic anemia could also be contributing to palpitations, DOE, fatigue. Will order to transfuse. Patient denies increased bleeding, increased bruising, melena, hematochezia. No gross blood on DRE.   CRITICAL CARE Performed by: Antonietta Breach   Total critical care time: 45 minutes  Critical care time was exclusive of separately billable procedures and treating other patients.  Critical care was necessary to treat or prevent imminent or life-threatening deterioration.  Critical care was time spent personally by me on the following activities: development of treatment plan with patient and/or surrogate as well as nursing, discussions with consultants, evaluation of patient's response to treatment, examination of patient, obtaining history from patient or surrogate, ordering and performing treatments and interventions, ordering and review of laboratory studies, ordering and review of radiographic studies, pulse oximetry and re-evaluation of patient's condition.   Initial Impression / Assessment and Plan / ED Course    I have reviewed the triage vital signs and the nursing notes.  Pertinent labs & imaging  results that were available during my care of the patient were reviewed by me and considered in my medical decision making (see chart for details).     58 year old female to be admitted to the hospital for management of symptomatic anemia, also with new onset type 2 diabetes.  She has no evidence of DKA.  Noted to be Hemoccult negative.  Orders placed for anemia panel.  Will initiate transfusion of RBCs.  Case discussed with Dr. Myna Hidalgo who will admit.   Final Clinical Impressions(s) / ED Diagnoses   Final diagnoses:  Symptomatic anemia  Thrombocytopenia ([place])  New onset type 2 diabetes mellitus [facility])    ED Discharge Orders    None       Antonietta Breach, PA-C 03/11/18 0014    Julianne Rice, MD 03/14/18 2126

## 2018-03-10 NOTE — ED Notes (Signed)
Date and time results received: 03/10/18 2318 (use smartphrase ".now" to insert current time)  Test: HGB Critical Value: 6.4  Name of Provider Notified: Deborah Chalk, [place]  Orders Received? Or Actions Taken?: Orders Received - See Orders for details

## 2018-03-10 NOTE — ED Triage Notes (Signed)
Pt went to her primary to have her heart checked because she felt like it was beating fast and they called her this evening and told her that her blood sugar was in the 600 and to come to the ED, pt has never been told she had diabetes Pt states that she has been drinking a lot more and urinating more

## 2018-03-11 ENCOUNTER — Observation Stay (HOSPITAL_COMMUNITY): Payer: BLUE CROSS/BLUE SHIELD

## 2018-03-11 ENCOUNTER — Encounter (HOSPITAL_COMMUNITY): Payer: Self-pay | Admitting: Family Medicine

## 2018-03-11 ENCOUNTER — Other Ambulatory Visit: Payer: Self-pay

## 2018-03-11 DIAGNOSIS — C787 Secondary malignant neoplasm of liver and intrahepatic bile duct: Secondary | ICD-10-CM

## 2018-03-11 DIAGNOSIS — D649 Anemia, unspecified: Secondary | ICD-10-CM | POA: Diagnosis not present

## 2018-03-11 DIAGNOSIS — E1165 Type 2 diabetes mellitus with hyperglycemia: Secondary | ICD-10-CM | POA: Diagnosis present

## 2018-03-11 DIAGNOSIS — D696 Thrombocytopenia, unspecified: Secondary | ICD-10-CM | POA: Diagnosis present

## 2018-03-11 DIAGNOSIS — D61818 Other pancytopenia: Secondary | ICD-10-CM | POA: Diagnosis not present

## 2018-03-11 DIAGNOSIS — C7800 Secondary malignant neoplasm of unspecified lung: Secondary | ICD-10-CM | POA: Diagnosis not present

## 2018-03-11 DIAGNOSIS — C7951 Secondary malignant neoplasm of bone: Secondary | ICD-10-CM

## 2018-03-11 DIAGNOSIS — E119 Type 2 diabetes mellitus without complications: Secondary | ICD-10-CM | POA: Diagnosis not present

## 2018-03-11 DIAGNOSIS — C50912 Malignant neoplasm of unspecified site of left female breast: Secondary | ICD-10-CM

## 2018-03-11 DIAGNOSIS — J4540 Moderate persistent asthma, uncomplicated: Secondary | ICD-10-CM | POA: Diagnosis present

## 2018-03-11 LAB — CBC WITH DIFFERENTIAL/PLATELET
ABS IMMATURE GRANULOCYTES: 0.77 10*3/uL — AB (ref 0.00–0.07)
Basophils Absolute: 0.3 10*3/uL — ABNORMAL HIGH (ref 0.0–0.1)
Basophils Relative: 4 %
Eosinophils Absolute: 0 10*3/uL (ref 0.0–0.5)
Eosinophils Relative: 0 %
HCT: 28.7 % — ABNORMAL LOW (ref 36.0–46.0)
Hemoglobin: 9.1 g/dL — ABNORMAL LOW (ref 12.0–15.0)
IMMATURE GRANULOCYTES: 12 %
Lymphocytes Relative: 33 %
Lymphs Abs: 2.2 10*3/uL (ref 0.7–4.0)
MCH: 27.4 pg (ref 26.0–34.0)
MCHC: 31.7 g/dL (ref 30.0–36.0)
MCV: 86.4 fL (ref 80.0–100.0)
MONO ABS: 0.8 10*3/uL (ref 0.1–1.0)
Monocytes Relative: 12 %
Neutro Abs: 2.6 10*3/uL (ref 1.7–7.7)
Neutrophils Relative %: 39 %
Platelets: 60 10*3/uL — ABNORMAL LOW (ref 150–400)
RBC: 3.32 MIL/uL — ABNORMAL LOW (ref 3.87–5.11)
RDW: 19.6 % — ABNORMAL HIGH (ref 11.5–15.5)
WBC: 6.7 10*3/uL (ref 4.0–10.5)
nRBC: 4.4 % — ABNORMAL HIGH (ref 0.0–0.2)

## 2018-03-11 LAB — PREPARE RBC (CROSSMATCH)

## 2018-03-11 LAB — VITAMIN B12: Vitamin B-12: 2002 pg/mL — ABNORMAL HIGH (ref 180–914)

## 2018-03-11 LAB — BLOOD GAS, ARTERIAL
Acid-base deficit: 1.2 mmol/L (ref 0.0–2.0)
Bicarbonate: 22.1 mmol/L (ref 20.0–28.0)
DRAWN BY: 441261
FIO2: 21
O2 Saturation: 99.9 %
Patient temperature: 98.6
pCO2 arterial: 33.1 mmHg (ref 32.0–48.0)
pH, Arterial: 7.44 (ref 7.350–7.450)
pO2, Arterial: 225 mmHg — ABNORMAL HIGH (ref 83.0–108.0)

## 2018-03-11 LAB — GLUCOSE, CAPILLARY
GLUCOSE-CAPILLARY: 321 mg/dL — AB (ref 70–99)
Glucose-Capillary: 306 mg/dL — ABNORMAL HIGH (ref 70–99)
Glucose-Capillary: 221 mg/dL — ABNORMAL HIGH (ref 70–99)
Glucose-Capillary: 311 mg/dL — ABNORMAL HIGH (ref 70–99)
Glucose-Capillary: 247 mg/dL — ABNORMAL HIGH (ref 70–99)

## 2018-03-11 LAB — BASIC METABOLIC PANEL
Anion gap: 16 — ABNORMAL HIGH (ref 5–15)
BUN: 14 mg/dL (ref 6–20)
CO2: 20 mmol/L — ABNORMAL LOW (ref 22–32)
Calcium: 9 mg/dL (ref 8.9–10.3)
Chloride: 92 mmol/L — ABNORMAL LOW (ref 98–111)
Creatinine, Ser: 0.67 mg/dL (ref 0.44–1.00)
GFR calc Af Amer: >60 mL/min (ref >60)
GFR calc non Af Amer: >60 mL/min (ref >60)
Glucose, Bld: 425 mg/dL — ABNORMAL HIGH (ref 70–99)
Potassium: 3.4 mmol/L — ABNORMAL LOW (ref 3.5–5.1)
Sodium: 128 mmol/L — ABNORMAL LOW (ref 135–145)

## 2018-03-11 LAB — ABO/RH: ABO/RH(D): A POS

## 2018-03-11 LAB — CBG MONITORING, ED: Glucose-Capillary: 343 mg/dL — ABNORMAL HIGH (ref 70–99)

## 2018-03-11 LAB — LACTATE DEHYDROGENASE: LDH: 164 U/L (ref 98–192)

## 2018-03-11 LAB — HEMOGLOBIN A1C
Hgb A1c MFr Bld: 12.9 % — ABNORMAL HIGH (ref 4.8–5.6)
Mean Plasma Glucose: 323.53 mg/dL

## 2018-03-11 LAB — RETICULOCYTES
Immature Retic Fract: 17.5 % — ABNORMAL HIGH (ref 2.3–15.9)
RBC.: 2.2 MIL/uL — ABNORMAL LOW (ref 3.87–5.11)
RETIC CT PCT: 2.8 % (ref 0.4–3.1)
Retic Count, Absolute: 60.5 10*3/uL (ref 19.0–186.0)

## 2018-03-11 LAB — IRON AND TIBC
Iron: 83 ug/dL (ref 28–170)
Saturation Ratios: 36 % — ABNORMAL HIGH (ref 10.4–31.8)
TIBC: 233 ug/dL — ABNORMAL LOW (ref 250–450)
UIBC: 150 ug/dL

## 2018-03-11 LAB — TSH: TSH: 3.103 u[IU]/mL (ref 0.350–4.500)

## 2018-03-11 LAB — FOLATE: Folate: 14.1 ng/mL (ref >5.9)

## 2018-03-11 LAB — FERRITIN: Ferritin: 913 ng/mL — ABNORMAL HIGH (ref 11–307)

## 2018-03-11 MED ORDER — MONTELUKAST SODIUM 10 MG PO TABS
10.0000 mg | ORAL_TABLET | Freq: Every day | ORAL | Status: DC
  Administered 2018-03-11 – 2018-03-12 (×3): 10 mg via ORAL
  Filled 2018-03-11 (×3): qty 1

## 2018-03-11 MED ORDER — INSULIN GLARGINE 100 UNIT/ML ~~LOC~~ SOLN
10.0000 [IU] | Freq: Every day | SUBCUTANEOUS | Status: DC
  Administered 2018-03-11: 10 [IU] via SUBCUTANEOUS
  Filled 2018-03-11 (×2): qty 0.1

## 2018-03-11 MED ORDER — METOPROLOL SUCCINATE ER 50 MG PO TB24: 50.0000 mg | ORAL_TABLET | Freq: Every evening | ORAL | Status: DC

## 2018-03-11 MED ORDER — SENNOSIDES-DOCUSATE SODIUM 8.6-50 MG PO TABS: 1.0000 | ORAL_TABLET | Freq: Every evening | ORAL | Status: DC | PRN

## 2018-03-11 MED ORDER — LIVING WELL WITH DIABETES BOOK
Freq: Once | Status: AC
  Administered 2018-03-11: 13:00:00
  Filled 2018-03-11: qty 1

## 2018-03-11 MED ORDER — SODIUM CHLORIDE 0.9% FLUSH: 3.0000 mL | Freq: Two times a day (BID) | INTRAVENOUS | Status: DC

## 2018-03-11 MED ORDER — SODIUM CHLORIDE 0.9% FLUSH: 3.0000 mL | INTRAVENOUS | Status: DC | PRN

## 2018-03-11 MED ORDER — ACETAMINOPHEN 325 MG PO TABS
650.0000 mg | ORAL_TABLET | Freq: Four times a day (QID) | ORAL | Status: DC | PRN
  Administered 2018-03-12 – 2018-03-13 (×5): 650 mg via ORAL
  Filled 2018-03-11 (×5): qty 2

## 2018-03-11 MED ORDER — PANTOPRAZOLE SODIUM 40 MG PO TBEC
40.0000 mg | DELAYED_RELEASE_TABLET | Freq: Two times a day (BID) | ORAL | Status: DC
  Administered 2018-03-11 – 2018-03-13 (×5): 40 mg via ORAL
  Filled 2018-03-11 (×5): qty 1

## 2018-03-11 MED ORDER — FLUTICASONE PROPIONATE HFA 44 MCG/ACT IN AERO: 2.0000 | INHALATION_SPRAY | Freq: Two times a day (BID) | RESPIRATORY_TRACT | Status: DC

## 2018-03-11 MED ORDER — SODIUM CHLORIDE 0.9 % IV SOLN
INTRAVENOUS | Status: DC
  Administered 2018-03-11 – 2018-03-13 (×4): via INTRAVENOUS

## 2018-03-11 MED ORDER — INSULIN ASPART 100 UNIT/ML ~~LOC~~ SOLN
0.0000 [IU] | Freq: Three times a day (TID) | SUBCUTANEOUS | Status: DC
  Administered 2018-03-11: 3 [IU] via SUBCUTANEOUS
  Administered 2018-03-11 (×2): 7 [IU] via SUBCUTANEOUS
  Administered 2018-03-12: 2 [IU] via SUBCUTANEOUS
  Administered 2018-03-12: 9 [IU] via SUBCUTANEOUS
  Administered 2018-03-12: 5 [IU] via SUBCUTANEOUS
  Administered 2018-03-13: 7 [IU] via SUBCUTANEOUS
  Administered 2018-03-13: 3 [IU] via SUBCUTANEOUS

## 2018-03-11 MED ORDER — IOHEXOL 300 MG/ML  SOLN
15.0000 mL | Freq: Once | INTRAMUSCULAR | Status: DC | PRN
  Administered 2018-03-11: 30 mL via ORAL
  Filled 2018-03-11: qty 20

## 2018-03-11 MED ORDER — IOHEXOL 300 MG/ML  SOLN: 100.0000 mL | Freq: Once | INTRAMUSCULAR | Status: DC | PRN

## 2018-03-11 MED ORDER — ACETAMINOPHEN 650 MG RE SUPP: 650.0000 mg | Freq: Four times a day (QID) | RECTAL | Status: DC | PRN

## 2018-03-11 MED ORDER — INSULIN ASPART 100 UNIT/ML ~~LOC~~ SOLN
0.0000 [IU] | Freq: Every day | SUBCUTANEOUS | Status: DC
  Administered 2018-03-11: 4 [IU] via SUBCUTANEOUS
  Administered 2018-03-11 – 2018-03-12 (×2): 2 [IU] via SUBCUTANEOUS
  Filled 2018-03-11: qty 1

## 2018-03-11 MED ORDER — GUAIFENESIN-DM 100-10 MG/5ML PO SYRP
5.0000 mL | ORAL_SOLUTION | ORAL | Status: DC | PRN
  Administered 2018-03-11: 5 mL via ORAL
  Filled 2018-03-11: qty 10

## 2018-03-11 MED ORDER — IOHEXOL 300 MG/ML  SOLN
100.0000 mL | Freq: Once | INTRAMUSCULAR | Status: AC | PRN
  Administered 2018-03-11: 100 mL via INTRAVENOUS

## 2018-03-11 MED ORDER — BUDESONIDE 0.25 MG/2ML IN SUSP
0.2500 mg | Freq: Two times a day (BID) | RESPIRATORY_TRACT | Status: DC
  Administered 2018-03-11 – 2018-03-12 (×3): 0.25 mg via RESPIRATORY_TRACT
  Filled 2018-03-11 (×5): qty 2

## 2018-03-11 MED ORDER — POTASSIUM CHLORIDE CRYS ER 20 MEQ PO TBCR
20.0000 meq | EXTENDED_RELEASE_TABLET | Freq: Once | ORAL | Status: AC
  Administered 2018-03-11: 20 meq via ORAL
  Filled 2018-03-11: qty 1

## 2018-03-11 MED ORDER — ONDANSETRON HCL 4 MG PO TABS: 4.0000 mg | ORAL_TABLET | Freq: Four times a day (QID) | ORAL | Status: DC | PRN

## 2018-03-11 MED ORDER — CYCLOBENZAPRINE HCL 10 MG PO TABS: 10.0000 mg | ORAL_TABLET | Freq: Every day | ORAL | Status: DC | PRN

## 2018-03-11 MED ORDER — POTASSIUM CHLORIDE IN NACL 20-0.9 MEQ/L-% IV SOLN
INTRAVENOUS | Status: AC
  Administered 2018-03-11 (×2): via INTRAVENOUS
  Filled 2018-03-11: qty 1000

## 2018-03-11 MED ORDER — ONDANSETRON HCL 4 MG/2ML IJ SOLN: 4.0000 mg | Freq: Four times a day (QID) | INTRAMUSCULAR | Status: DC | PRN

## 2018-03-11 MED ORDER — SODIUM CHLORIDE 0.9 % IV SOLN: 250.0000 mL | INTRAVENOUS | Status: DC | PRN

## 2018-03-11 MED ORDER — INSULIN ASPART 100 UNIT/ML ~~LOC~~ SOLN: 4.0000 [IU] | Freq: Three times a day (TID) | SUBCUTANEOUS | Status: DC

## 2018-03-11 MED ORDER — ALBUTEROL SULFATE HFA 108 (90 BASE) MCG/ACT IN AERS: 2.0000 | INHALATION_SPRAY | Freq: Four times a day (QID) | RESPIRATORY_TRACT | Status: DC | PRN

## 2018-03-11 MED ORDER — ALBUTEROL SULFATE (2.5 MG/3ML) 0.083% IN NEBU
2.5000 mg | INHALATION_SOLUTION | Freq: Four times a day (QID) | RESPIRATORY_TRACT | Status: DC | PRN
  Administered 2018-03-11: 2.5 mg via RESPIRATORY_TRACT
  Filled 2018-03-11: qty 3

## 2018-03-11 NOTE — ED Notes (Signed)
ED TO INPATIENT HANDOFF REPORT  Name/Age/Gender Stacy Shelton 58 y.o. female  Code Status    Code Status Orders  (From admission, onward)         Start     Ordered   03/11/18 0012  Full code  Continuous     03/11/18 0013        Code Status History    This patient has a current code status but no historical code status.      Home/SNF/Other Given to floor  Chief Complaint Thrombocytopenia ([place]) [D69.6] Symptomatic anemia [D64.9] New onset type 2 diabetes mellitus ([place]) [E11.9]  Level of Care/Admitting Diagnosis ED Disposition    ED Disposition Condition Comment   Admit  Hospital Area: [facility] [[id number]]  Level of Care: Telemetry [5]  Admit to tele based on following criteria: Monitor for Ischemic changes  Diagnosis: Symptomatic anemia [1324401]  Admitting Physician: Vianne Bulls [[id number]]  Attending Physician: Vianne Bulls [[id number]]  PT Class (Do Not Modify): Observation [104]  PT Acc Code (Do Not Modify): Observation [10022]       Medical History History reviewed. No pertinent past medical history.  Allergies Allergies  Allergen Reactions  . Nsaids Anaphylaxis    IV Location/Drains/Wounds Patient Lines/Drains/Airways Status   Active Line/Drains/Airways    Name:   Placement date:   Placement time:   Site:   Days:   Peripheral IV 03/10/18 Right Hand   03/10/18    2237    Hand   1          Labs/Imaging Results for orders placed or performed during the hospital encounter of 03/10/18 (from the past 48 hour(s))  CBG monitoring, ED     Status: Abnormal   Collection Time: 03/10/18  8:55 PM  Result Value Ref Range   Glucose-Capillary 431 (H) 70 - 99 mg/dL  CBC with Differential     Status: Abnormal   Collection Time: 03/10/18 10:07 PM  Result Value Ref Range   WBC 8.5 4.0 - 10.5 K/uL    Comment: WHITE COUNT CONFIRMED ON SMEAR   RBC 2.37 (L) 3.87 - 5.11 MIL/uL   Hemoglobin 6.4 (LL) 12.0 - 15.0 g/dL    Comment: REPEATED TO  VERIFY THIS CRITICAL RESULT HAS VERIFIED AND BEEN CALLED TO Z Vernor Monnig RN BY AMELIA NAVARRO ON 02 10 2020 AT 2316, AND HAS BEEN READ BACK.     HCT 20.5 (L) 36.0 - 46.0 %   MCV 86.5 80.0 - 100.0 fL   MCH 27.0 26.0 - 34.0 pg   MCHC 31.2 30.0 - 36.0 g/dL   RDW 24.6 (H) 11.5 - 15.5 %   Platelets 74 (L) 150 - 400 K/uL    Comment: REPEATED TO VERIFY PLATELET COUNT CONFIRMED BY SMEAR SPECIMEN CHECKED FOR CLOTS Immature Platelet Fraction may be clinically indicated, consider ordering this additional test [id number]    nRBC 4.8 (H) 0.0 - 0.2 %   Neutrophils Relative % 39 %   Neutro Abs 3.3 1.7 - 7.7 K/uL   Lymphocytes Relative 38 %   Lymphs Abs 3.2 0.7 - 4.0 K/uL   Monocytes Relative 11 %   Monocytes Absolute 0.9 0.1 - 1.0 K/uL   Eosinophils Relative 0 %   Eosinophils Absolute 0.0 0.0 - 0.5 K/uL   Basophils Relative 2 %   Basophils Absolute 0.2 (H) 0.0 - 0.1 K/uL   RBC Morphology RARE NUCLEATED RED BLOOD CELLS    Immature Granulocytes 10 %   Abs Immature  Granulocytes 0.84 (H) 0.00 - 2.77 K/uL   Basophilic Stippling PRESENT     Comment: Performed at [facility], [place] [address], [place], [place] [address]  Comprehensive metabolic panel     Status: Abnormal   Collection Time: 03/10/18 10:07 PM  Result Value Ref Range   Sodium 130 (L) 135 - 145 mmol/L   Potassium 3.4 (L) 3.5 - 5.1 mmol/L   Chloride 94 (L) 98 - 111 mmol/L   CO2 21 (L) 22 - 32 mmol/L   Glucose, Bld 433 (H) 70 - 99 mg/dL   BUN 15 6 - 20 mg/dL   Creatinine, Ser 0.68 0.44 - 1.00 mg/dL   Calcium 9.1 8.9 - 10.3 mg/dL   Total Protein 7.1 6.5 - 8.1 g/dL   Albumin 3.7 3.5 - 5.0 g/dL   AST 14 (L) 15 - 41 U/L   ALT 13 0 - 44 U/L   Alkaline Phosphatase 78 38 - 126 U/L   Total Bilirubin 1.2 0.3 - 1.2 mg/dL   GFR calc non Af Amer >60 >60 mL/min   GFR calc Af Amer >60 >60 mL/min   Anion gap 15 5 - 15    Comment: Performed at [facility], [place] [address], [place], [place] [address]   Urinalysis, Routine w reflex microscopic     Status: Abnormal   Collection Time: 03/10/18 10:07 PM  Result Value Ref Range   Color, Urine YELLOW YELLOW   APPearance CLEAR CLEAR   Specific Gravity, Urine 1.028 1.005 - 1.030   pH 5.0 5.0 - 8.0   Glucose, UA >=500 (A) NEGATIVE mg/dL   Hgb urine dipstick NEGATIVE NEGATIVE   Bilirubin Urine NEGATIVE NEGATIVE   Ketones, ur 80 (A) NEGATIVE mg/dL   Protein, ur NEGATIVE NEGATIVE mg/dL   Nitrite NEGATIVE NEGATIVE   Leukocytes, UA NEGATIVE NEGATIVE   Bacteria, UA NONE SEEN NONE SEEN    Comment: Performed at [facility] [address] [place], [place] [address]  Blood gas, venous     Status: Abnormal   Collection Time: 03/10/18 10:35 PM  Result Value Ref Range   FIO2 21.00    Delivery systems ROOM AIR    pH, Ven 7.416 7.250 - 7.430   pCO2, Ven 36.8 (L) 44.0 - 60.0 mmHg   pO2, Ven 56.2 (H) 32.0 - 45.0 mmHg   Bicarbonate 23.2 20.0 - 28.0 mmol/L   Acid-base deficit 0.6 0.0 - 2.0 mmol/L   O2 Saturation 90.2 %   Patient temperature 98.6    Collection site VEIN    Drawn by COLLECTED BY NURSE    Sample type VENOUS     Comment: Performed at [facility] [address] [place], [place] [address]  Prepare RBC     Status: None   Collection Time: 03/10/18 11:49 PM  Result Value Ref Range   Order Confirmation      ORDER PROCESSED BY BLOOD BANK Performed at [facility] [address], [place], [place] [address]   Type and screen     Status: None (Preliminary result)   Collection Time: 03/10/18 11:50 PM  Result Value Ref Range   ABO/RH(D) A POS    Antibody Screen NEG    Sample Expiration      03/13/2018 Performed at [facility], [place] [address], [place], [place] [address]    Unit Number [id number]    Blood Component Type RED CELLS,LR    Unit division 00  Status of Unit ALLOCATED    Transfusion Status OK TO TRANSFUSE    Crossmatch Result Compatible     Unit Number [id number]    Blood Component Type RED CELLS,LR    Unit division 00    Status of Unit ALLOCATED    Transfusion Status OK TO TRANSFUSE    Crossmatch Result Compatible   Vitamin B12     Status: None   Collection Time: 03/10/18 11:50 PM  Result Value Ref Range   Vitamin B-12 (NOTE) 180 - 914 pg/mL    Comment: This assay is not validated for testing neonatal or myeloproliferative syndrome specimens for Vitamin B12 levels. Performed at [facility], [place] [address], [place], [place] [address]   Folate     Status: None   Collection Time: 03/10/18 11:50 PM  Result Value Ref Range   Folate 14.1 >5.9 ng/mL    Comment: Performed at [facility], [place] [address] [place], [place] [address]  Iron and TIBC     Status: Abnormal   Collection Time: 03/10/18 11:50 PM  Result Value Ref Range   Iron 83 28 - 170 ug/dL   TIBC 233 (L) 250 - 450 ug/dL   Saturation Ratios 36 (H) 10.4 - 31.8 %   UIBC 150 ug/dL    Comment: Performed at [facility], [place] [address] [place], [place] [address]  Ferritin     Status: Abnormal   Collection Time: 03/10/18 11:50 PM  Result Value Ref Range   Ferritin 913 (H) 11 - 307 ng/mL    Comment: Performed at [facility], [place] [address], [place], [place] [address]  Reticulocytes     Status: Abnormal   Collection Time: 03/10/18 11:50 PM  Result Value Ref Range   Retic Ct Pct 2.8 0.4 - 3.1 %   RBC. 2.20 (L) 3.87 - 5.11 MIL/uL   Retic Count, Absolute 60.5 19.0 - 186.0 K/uL   Immature Retic Fract 17.5 (H) 2.3 - 15.9 %    Comment: Performed at [facility], [place] [address] [place], [place] [address]  POC occult blood, ED Provider will collect     Status: None   Collection Time: 03/10/18 11:53 PM  Result Value Ref Range   Fecal Occult Bld NEGATIVE NEGATIVE  ABO/Rh     Status: None (Preliminary result)   Collection Time: 03/11/18 12:11 AM  Result Value Ref Range    ABO/RH(D)      A POS Performed at [facility], [place] [address], [place], [place] [address]   CBG monitoring, ED     Status: Abnormal   Collection Time: 03/11/18  1:08 AM  Result Value Ref Range   Glucose-Capillary 343 (H) 70 - 99 mg/dL   No results found.  Pending Labs Unresulted Labs (From admission, onward)    Start     Ordered   03/11/18 0500  HIV antibody (Routine Testing)  Tomorrow morning,   R     03/11/18 0013   03/11/18 0048  Hemoglobin A1c  Add-on,   R     03/11/18 0047   03/11/18 0032  Lactate dehydrogenase  Add-on,   R     03/11/18 0031   03/11/18 0032  TSH  Add-on,   R     03/11/18 0031   03/11/18 4650  Basic metabolic panel  Once,   R     03/11/18 0013          Vitals/Pain Today's  Vitals   03/10/18 2053 03/10/18 2119 03/11/18 0107 03/11/18 0206  BP: (!) 164/75  (!) 127/59 (!) 129/56  Pulse: (!) 116  (!) 103 (!) 101  Resp: 18  16 18   Temp: 97.9 F (36.6 C)   98.7 F (37.1 C)  TempSrc: Oral     SpO2: 94%  98% 96%  Weight: 68.9 kg     Height: 5\' 2"  (1.575 m)     PainSc:  0-No pain      Isolation Precautions No active isolations  Medications Medications  pantoprazole (PROTONIX) EC tablet 40 mg (has no administration in time range)  montelukast (SINGULAIR) tablet 10 mg (10 mg Oral Given 03/11/18 0131)  sodium chloride flush (NS) 0.9 % injection 3 mL (has no administration in time range)  sodium chloride flush (NS) 0.9 % injection 3 mL (has no administration in time range)  sodium chloride flush (NS) 0.9 % injection 3 mL (has no administration in time range)  0.9 %  sodium chloride infusion (has no administration in time range)  acetaminophen (TYLENOL) tablet 650 mg (has no administration in time range)    Or  acetaminophen (TYLENOL) suppository 650 mg (has no administration in time range)  senna-docusate (Senokot-S) tablet 1 tablet (has no administration in time range)  ondansetron (ZOFRAN) tablet 4 mg (has no administration in  time range)    Or  ondansetron (ZOFRAN) injection 4 mg (has no administration in time range)  insulin aspart (novoLOG) injection 0-9 Units (has no administration in time range)  insulin aspart (novoLOG) injection 0-5 Units (4 Units Subcutaneous Given 03/11/18 0133)  0.9 % NaCl with KCl 20 mEq/ L  infusion ( Intravenous New Bag/Given 03/11/18 0136)  potassium chloride SA (K-DUR,KLOR-CON) CR tablet 20 mEq (has no administration in time range)  budesonide (PULMICORT) nebulizer solution 0.25 mg (0.25 mg Nebulization Not Given 03/11/18 0103)  albuterol (PROVENTIL) (2.5 MG/3ML) 0.083% nebulizer solution 2.5 mg (has no administration in time range)  sodium chloride 0.9 % bolus 1,000 mL (0 mLs Intravenous Stopped 03/11/18 0030)  0.9 %  sodium chloride infusion (10 mL/hr Intravenous New Bag/Given 03/11/18 0131)    Mobility walks

## 2018-03-11 NOTE — Consult Note (Signed)
Chief Complaint: Patient was seen in consultation today for CT-guided bone marrow biopsy Chief Complaint  Patient presents with  . Hyperglycemia    Referring Physician(s): Gorsuch,N  Supervising Physician: Daryll Brod  Patient Status: [facility] - In-pt  History of Present Illness: Stacy Shelton is a 58 y.o. female nonsmoker with past medical history of asthma/OSA on CPAP who recently presented to [facility] with persistent cough, palpitations, fatigue, dyspnea with exertion, weight loss, elevated blood sugar/new onset diabetes as well as symptomatic anemia/thrombocytopenia of unknown etiology.  She has since been transfused.  Peripheral blood smear is also abnormal. No known hx malignancy.  Request now received from hematology/oncology for CT-guided bone marrow biopsy for further evaluation.  History reviewed. No pertinent past medical history.  Past Surgical History:  Procedure Laterality Date  . KIDNEY STONE SURGERY    . SINOSCOPY    . WISDOM TOOTH EXTRACTION      Allergies: Nsaids  Medications: Prior to Admission medications   Medication Sig Start Date End Date Taking? Authorizing Provider  albuterol (PROAIR HFA) 108 (90 Base) MCG/ACT inhaler Inhale 2 puffs into the lungs every 6 (six) hours as needed for wheezing or shortness of breath. 05/16/17  Yes Padgett, Rae Halsted, MD  cyclobenzaprine (FLEXERIL) 10 MG tablet Take 10 mg by mouth daily as needed for muscle spasms.  01/26/18  Yes [provider]  esomeprazole (NEXIUM) 40 MG capsule Take 1 capsule (40 mg total) by mouth 2 (two) times daily before a meal. 05/16/17  Yes Padgett, Rae Halsted, MD  fluticasone (FLOVENT HFA) 44 MCG/ACT inhaler Inhale 2 puffs into the lungs 2 (two) times daily. 05/16/17  Yes Padgett, Rae Halsted, MD  metoprolol succinate (TOPROL-XL) 50 MG 24 hr tablet Take 50 mg by mouth every evening. 03/10/18  Yes [provider]  montelukast (SINGULAIR) 10 MG tablet  TAKE 1 TABLET BY MOUTH EVERYDAY AT BEDTIME Patient taking differently: Take 10 mg by mouth daily.  08/12/17  Yes Padgett, Rae Halsted, MD  Multiple Vitamin (MULTIVITAMIN WITH MINERALS) TABS tablet Take 1 tablet by mouth daily.   Yes [provider]  ranitidine (ZANTAC) 300 MG tablet Take 1 tablet (300 mg total) by mouth at bedtime. Patient taking differently: Take 300 mg by mouth at bedtime as needed for heartburn.  05/16/17  Yes Padgett, Rae Halsted, MD  triamcinolone (NASACORT ALLERGY 24HR) 55 MCG/ACT AERO nasal inhaler Place 2 sprays into the nose daily.   Yes [provider]     Family History  Problem Relation Age of Onset  . Allergic rhinitis Neg Hx   . Angioedema Neg Hx   . Asthma Neg Hx   . Atopy Neg Hx   . Eczema Neg Hx   . Immunodeficiency Neg Hx   . Urticaria Neg Hx     Social History   Socioeconomic History  . Marital status: Single    Spouse name: Not on file  . Number of children: Not on file  . Years of education: Not on file  . Highest education level: Not on file  Occupational History  . Not on file  Social Needs  . Financial resource strain: Not on file  . Food insecurity:    Worry: Not on file    Inability: Not on file  . Transportation needs:    Medical: Not on file    Non-medical: Not on file  Tobacco Use  . Smoking status: Never Smoker  . Smokeless tobacco: Never Used  Substance and Sexual  Activity  . Alcohol use: Yes    Alcohol/week: 0.0 standard drinks  . Drug use: No  . Sexual activity: Not on file  Lifestyle  . Physical activity:    Days per week: Not on file    Minutes per session: Not on file  . Stress: Not on file  Relationships  . Social connections:    Talks on phone: Not on file    Gets together: Not on file    Attends religious service: Not on file    Active member of club or organization: Not on file    Attends meetings of clubs or organizations: Not on file    Relationship status: Not on file  Other  Topics Concern  . Not on file  Social History Narrative  . Not on file      Review of Systems see above; denies fever, HA,CP, abd pain,N/V or bleeding.   Vital Signs: BP 136/75 (BP Location: Left Arm)   Pulse 94   Temp 99.2 F (37.3 C) (Oral)   Resp 19   Ht 5' 2.5" (1.588 m)   Wt 155 lb 6.8 oz (70.5 kg)   SpO2 100%   BMI 27.97 kg/m   Physical Exam awake, alert.  Chest clear to auscultation bilaterally.  Heart with regular rate and rhythm.  Abdomen soft, positive bowel sounds, nontender.  Extremities with full range of motion.  Imaging: No results found.  Labs:  CBC: Recent Labs    03/10/18 2207 03/11/18 1235  WBC 8.5 6.7  HGB 6.4* 9.1*  HCT 20.5* 28.7*  PLT 74* 60*    COAGS: No results for input(s): INR, APTT in the last 8760 hours.  BMP: Recent Labs    03/10/18 2207 03/11/18 0013  NA 130* 128*  K 3.4* 3.4*  CL 94* 92*  CO2 21* 20*  GLUCOSE 433* 425*  BUN 15 14  CALCIUM 9.1 9.0  CREATININE 0.68 0.67  GFRNONAA >60 >60  GFRAA >60 >60    LIVER FUNCTION TESTS: Recent Labs    03/10/18 2207  BILITOT 1.2  AST 14*  ALT 13  ALKPHOS 78  PROT 7.1  ALBUMIN 3.7    TUMOR MARKERS: No results for input(s): AFPTM, CEA, CA199, CHROMGRNA in the last 8760 hours.  Assessment and Plan:  58 y.o. female nonsmoker with past medical history of asthma/OSA on CPAP who recently presented to [facility] with persistent cough, palpitations, fatigue, dyspnea with exertion, weight loss, elevated blood sugar/new onset diabetes as well as symptomatic anemia/thrombocytopenia of unknown etiology.  She has since been transfused.  Peripheral blood smear is also abnormal. No known hx malignancy.  Request now received from hematology/oncology for CT-guided bone marrow biopsy for further evaluation.Risks and benefits of procedure was discussed with the patient and/or patient's family including, but not limited to bleeding, infection, damage to adjacent structures or low  yield requiring additional tests.  All of the questions were answered and there is agreement to proceed.  Consent signed and in chart.  Date of procedure pend further eval of IR OP schedule, likely 2/13 or 2/14   Thank you for this interesting consult.  I greatly enjoyed meeting Stacy Shelton and look forward to participating in their care.  A copy of this report was sent to the requesting provider on this date.  Electronically Signed: D. Rowe Robert, PA-C 03/11/2018, 3:57 PM   I spent a total of 20 minutes in face to face in clinical consultation, greater than 50% of which  was counseling/coordinating care for CT guided bone marrow biopsy

## 2018-03-11 NOTE — Progress Notes (Signed)
[facility] NOTE  Patient Care Team: Robyne Peers, MD as PCP - General (Family Medicine)  ASSESSMENT & PLAN:  Acute pancytopenia I discussed the differential diagnoses with the patient. There is nothing obvious in her history that could explain the acute pancytopenia.  Due to abnormal peripheral smear, I will be worried about malignancy. I recommend CT scan of the chest, abdomen and pelvis along with bone marrow aspirate and biopsy. We discussed the risk, benefits, side effects of bone marrow aspirate and biopsy and she would like that to be done under sedation.  I have ordered them. In the meantime, unless she is symptomatic, she does not need further blood transfusion, unless hemoglobin dropped to less than 7. She does not need platelet transfusion, unless her platelet count is less than 10 or she starts bleeding  Chronic, unresolved cough I cannot explain the cause of her cough.  I recommend CT scan of the chest, abdomen and pelvis to exclude malignancy and she agree with the plan of care  Undiagnosed diabetes She has new onset of diabetes.  She is currently receiving insulin treatment for this  Discharge planning She is still undergoing extensive work-up.  Hopefully, she can be discharged at the end of the week.  All questions were answered. The patient knows to call the clinic with any problems, questions or concerns. I spent 60 minutes counseling the patient face to face. The total time spent in the appointment was 70 minutes and more than 50% was on counseling.     [name], MD 03/11/18  2:44 PM  CHIEF COMPLAINTS/PURPOSE OF CONSULTATION:  Significant, severe pancytopenia  HISTORY OF PRESENTING ILLNESS:  Stacy Shelton 58 y.o. female is seen at the request by hospitalist to evaluate this patient who presented with severe pancytopenia. The patient had background history of asthma.  She has not been feeling well since 3 months ago with significant  cough.  She was treated aggressively by her allergist and has an established primary care visit recently.  The patient was diagnosed with viral upper respiratory tract infection and had recurrent courses of prednisone therapy for asthma exacerbation.  Despite resolution of her viral illness, her cough did not go away. We do not have her baseline blood work.  Yesterday, her white blood cell count was normal at 7.4 but hemoglobin was low at 7.6 and platelet count of 72,000. In addition, serum chemistries revealed severe hyperglycemia with a blood glucose of 615.  The patient has been complaining of profound fatigue.  She also has shortness of breath on minimal exertion. She was directed to the emergency department for evaluation and treatment of severe hyperglycemia. In the emergency department, repeat CBC showed worsening anemia with a hemoglobin of 6.4.  She received 2 units of blood transfusion with additional work-up including iron studies, vitamin B12, folate, and others.  Reticulocyte count is inappropriately low.  All her nutritional studies were adequate.  There were no signs of hemolysis. She received 2 units of blood transfusion overnight.  She denies recent chest pain on exertion, pre-syncopal episodes, or palpitations. She had not noticed any recent bleeding such as epistaxis, hematuria or hematochezia The patient denies over the counter NSAID ingestion. She is not on antiplatelets agents.  She had no prior history or diagnosis of cancer. Her age appropriate screening programs are up-to-date. She denies any pica and eats a variety of diet. She never donated blood or received blood transfusion She drinks wine 3 times a week.  She has noticed reduced appetite and have lost approximately 15 pounds over the last few months.  She denies abnormal night sweats.  No abnormal lymphadenopathy She has mild constipation recently She denies new medications or over-the-counter supplements  MEDICAL  HISTORY:  History reviewed. No pertinent past medical history.  SURGICAL HISTORY: Past Surgical History:  Procedure Laterality Date  . KIDNEY STONE SURGERY    . SINOSCOPY    . WISDOM TOOTH EXTRACTION      SOCIAL HISTORY: Social History   Socioeconomic History  . Marital status: Single    Spouse name: Not on file  . Number of children: Not on file  . Years of education: Not on file  . Highest education level: Not on file  Occupational History  . Not on file  Social Needs  . Financial resource strain: Not on file  . Food insecurity:    Worry: Not on file    Inability: Not on file  . Transportation needs:    Medical: Not on file    Non-medical: Not on file  Tobacco Use  . Smoking status: Never Smoker  . Smokeless tobacco: Never Used  Substance and Sexual Activity  . Alcohol use: Yes    Alcohol/week: 0.0 standard drinks  . Drug use: No  . Sexual activity: Not on file  Lifestyle  . Physical activity:    Days per week: Not on file    Minutes per session: Not on file  . Stress: Not on file  Relationships  . Social connections:    Talks on phone: Not on file    Gets together: Not on file    Attends religious service: Not on file    Active member of club or organization: Not on file    Attends meetings of clubs or organizations: Not on file    Relationship status: Not on file  . Intimate partner violence:    Fear of current or ex partner: Not on file    Emotionally abused: Not on file    Physically abused: Not on file    Forced sexual activity: Not on file  Other Topics Concern  . Not on file  Social History Narrative  . Not on file    FAMILY HISTORY: Family History  Problem Relation Age of Onset  . Allergic rhinitis Neg Hx   . Angioedema Neg Hx   . Asthma Neg Hx   . Atopy Neg Hx   . Eczema Neg Hx   . Immunodeficiency Neg Hx   . Urticaria Neg Hx     ALLERGIES:  is allergic to nsaids.  MEDICATIONS:  Current Facility-Administered Medications   Medication Dose Route Frequency Provider Last Rate Last Dose  . 0.9 %  sodium chloride infusion  250 mL Intravenous PRN Opyd, Ilene Qua, MD      . 0.9 %  sodium chloride infusion   Intravenous Continuous Kayleen Memos, DO 75 mL/hr at 03/11/18 1441    . acetaminophen (TYLENOL) tablet 650 mg  650 mg Oral Q6H PRN Opyd, Ilene Qua, MD       Or  . acetaminophen (TYLENOL) suppository 650 mg  650 mg Rectal Q6H PRN Opyd, Ilene Qua, MD      . albuterol (PROVENTIL) (2.5 MG/3ML) 0.083% nebulizer solution 2.5 mg  2.5 mg Nebulization Q6H PRN Opyd, Ilene Qua, MD      . budesonide (PULMICORT) nebulizer solution 0.25 mg  0.25 mg Nebulization BID Opyd, Ilene Qua, MD   0.25 mg at 03/11/18 0720  .  guaiFENesin-dextromethorphan (ROBITUSSIN DM) 100-10 MG/5ML syrup 5 mL  5 mL Oral Q4H PRN Opyd, Ilene Qua, MD   5 mL at 03/11/18 0645  . insulin aspart (novoLOG) injection 0-5 Units  0-5 Units Subcutaneous QHS Opyd, Ilene Qua, MD   4 Units at 03/11/18 0133  . insulin aspart (novoLOG) injection 0-9 Units  0-9 Units Subcutaneous TID WC Opyd, Ilene Qua, MD   3 Units at 03/11/18 1315  . insulin aspart (novoLOG) injection 4 Units  4 Units Subcutaneous TID WC Hall, Carole N, DO      . insulin glargine (LANTUS) injection 10 Units  10 Units Subcutaneous Daily Hall, Carole N, DO      . iohexol (OMNIPAQUE) 300 MG/ML solution 15 mL  15 mL Oral Once PRN Irene Pap N, DO      . montelukast (SINGULAIR) tablet 10 mg  10 mg Oral QHS Opyd, Ilene Qua, MD   10 mg at 03/11/18 0131  . ondansetron (ZOFRAN) tablet 4 mg  4 mg Oral Q6H PRN Opyd, Ilene Qua, MD       Or  . ondansetron (ZOFRAN) injection 4 mg  4 mg Intravenous Q6H PRN Opyd, Ilene Qua, MD      . pantoprazole (PROTONIX) EC tablet 40 mg  40 mg Oral BID Opyd, Ilene Qua, MD   40 mg at 03/11/18 0842  . senna-docusate (Senokot-S) tablet 1 tablet  1 tablet Oral QHS PRN Opyd, Ilene Qua, MD      . sodium chloride flush (NS) 0.9 % injection 3 mL  3 mL Intravenous Q12H Opyd, Timothy S, MD       . sodium chloride flush (NS) 0.9 % injection 3 mL  3 mL Intravenous Q12H Opyd, Ilene Qua, MD      . sodium chloride flush (NS) 0.9 % injection 3 mL  3 mL Intravenous PRN Opyd, Ilene Qua, MD        REVIEW OF SYSTEMS:   Constitutional: Denies fevers, chills or abnormal night sweats Eyes: Denies blurriness of vision, double vision or watery eyes Ears, nose, mouth, throat, and face: Denies mucositis or sore throat Cardiovascular: Denies palpitation, chest discomfort or lower extremity swelling Skin: Denies abnormal skin rashes Lymphatics: Denies new lymphadenopathy or easy bruising Neurological:Denies numbness, tingling or new weaknesses Behavioral/Psych: Mood is stable, no new changes  All other systems were reviewed with the patient and are negative.  PHYSICAL EXAMINATION: ECOG PERFORMANCE STATUS: 1 - Symptomatic but completely ambulatory  Vitals:   03/11/18 0922 03/11/18 1320  BP: (!) 130/58 136/75  Pulse: 92 94  Resp: 18 19  Temp: 98.6 F (37 C) 99.2 F (37.3 C)  SpO2: 96% 100%   Filed Weights   03/10/18 2053 03/11/18 0206  Weight: 152 lb (68.9 kg) 155 lb 6.8 oz (70.5 kg)    GENERAL:alert, no distress and comfortable SKIN: skin color, texture, turgor are normal, no rashes or significant lesions EYES: normal, conjunctiva are pink and non-injected, sclera clear OROPHARYNX:no exudate, no erythema and lips, buccal mucosa, and tongue normal  NECK: supple, thyroid normal size, non-tender, without nodularity LYMPH:  no palpable lymphadenopathy in the cervical, axillary or inguinal LUNGS: clear to auscultation and percussion with normal breathing effort HEART: regular rate & rhythm and no murmurs and no lower extremity edema ABDOMEN:abdomen soft, non-tender and normal bowel sounds Musculoskeletal:no cyanosis of digits and no clubbing  PSYCH: alert & oriented x 3 with fluent speech NEURO: no focal motor/sensory deficits  I have reviewed her peripheral blood smear and then  consulted the hematopathologist today. She has absolute thrombocytopenia without signs of schistocytes or clumping. The white blood cell morphology is not normal.  Somewhat atypical lymphs could represent blasts. There are neutrophils appear hypogranular.  Nucleated RBC in the peripheral blood is noted

## 2018-03-11 NOTE — Progress Notes (Addendum)
Inpatient Diabetes Program Recommendations  AACE/ADA: New Consensus Statement on Inpatient Glycemic Control (2015)  Target Ranges:  Prepandial:   less than 140 mg/dL      Peak postprandial:   less than 180 mg/dL (1-2 hours)      Critically ill patients:  140 - 180 mg/dL   Lab Results  Component Value Date   GLUCAP 321 (H) 03/11/2018   HGBA1C 12.9 (H) 03/10/2018    Review of Glycemic Control Results for [name], Stacy Shelton (MRN [id number]) as of 03/11/2018 09:05  Ref. Range 03/10/2018 20:55 03/11/2018 01:08 03/11/2018 05:08 03/11/2018 07:52  Glucose-Capillary Latest Ref Range: 70 - 99 mg/dL 431 (H) 343 (H) 306 (H) 321 (H)   Diabetes history: New onset DM2 Current orders for Inpatient glycemic control: Novolog sensitive correction tid + hs 0-5 units  Inpatient Diabetes Program Recommendations:   Will speak to patient in person. Noted new onset DM2 with A1c 12.9. Ordered Living Well With Diabetes Book,patient education videos, and dietician consult. Please consider: -Lantus 17 units daily (0.2 units/kg x 70.5 kg) -Novolog 4 units tid meal coverage if eats 50% 12:45 spoke with patient @ bedside. Patient drinks one regular soda from a restaurant one time daily and drinks water the rest of the time. Patient has been eating a dessert each evening since her mom moved in with her. Patient made nutrition adjustments and lost approx. 40 lbs over the past year. Also noted hgb 9.1. Spoke with pt about new diagnosis. Discussed A1C results with them and explained what an A1C is, basic pathophysiology of DM Type 2, basic home care, basic diabetes diet nutrition principles, importance of checking CBGs and maintaining good CBG control to prevent long-term and short-term complications. Reviewed signs and symptoms of hyperglycemia and hypoglycemia and how to treat hypoglycemia at home. Also reviewed blood sugar goals at home.  RNs to provide ongoing basic DM education at bedside with this patient.  Nurses, please  allow patient to give own injections and educate on injections to prepare if patient is discharged home on insulin.  Thank you, Nani Gasser. Jamey Demchak, RN, MSN, CDE  Diabetes Coordinator Inpatient Glycemic Control Team Team Pager [phone] (8am-5pm) 03/11/2018 9:12 AM

## 2018-03-11 NOTE — Progress Notes (Addendum)
Stacy Shelton is a 58 y.o. female with medical history significant for asthma, now presenting to emergency department for evaluation of palpitations and abnormal outpatient blood work.  Patient reports being in her usual state of health until approximately 2 weeks ago when she noted the insidious development of exertional dyspnea, fatigue, and palpitations.    Went to her PCP yesterday where she was found to have hemoglobin of 6.4 and blood sugars in the 400.  Referred to ED for admission.  [facility] asked to admit for symptomatic anemia and newly diagnosed diabetes.  Iron studies and FOBT negative.  Denies obvious signs of GI bleed. Post menopausal since 2012. Also noted to be thrombocytopenic. Will consult hematology to further assess.  03/11/2018: Patient seen and examined at bedside.  She states she feels better post blood transfusion.  Also reports a lingering cough of 3 months duration.  Please refer to H&P dictated by Dr. Myna Hidalgo on 03/11/2018 for further details of the assessment and plan.

## 2018-03-11 NOTE — Progress Notes (Signed)
Diabetes education reviewed with patient, reviewed insulin administration with patient.

## 2018-03-11 NOTE — ED Notes (Signed)
Consent for blood transfusion signed.  

## 2018-03-11 NOTE — H&P (Signed)
History and Physical    Stacy Shelton UEA:[id number] DOB: [birth year] DOA: 03/10/2018  PCP: Robyne Peers, MD   Patient coming from: Home   Chief Complaint: Palpitations, fatigue   HPI: Stacy Shelton is a 58 y.o. female with medical history significant for asthma, now presenting to emergency department for evaluation of palpitations and abnormal outpatient blood work.  Patient reports being in her usual state of health until approximately 2 weeks ago when she noted the insidious development of exertional dyspnea, fatigue, and palpitations.  She had suffered a viral URI in November with fevers and cough, but that seemed to have resolved completely when she noted becoming fatigued with only minimal exertion approximately 2 weeks ago.  She describes having to lay down and rest after very light housework.  She also developed palpitations around that time that has been intermittent.  She denies any cough or wheezing recently, but becomes short of breath with minimal activity.  All of the symptoms have been slowly worsening over the past couple weeks, she was evaluated in an outpatient clinic today, was sent for some basic blood work, started on Toprol for suspected hyperthyroidism, and was instructed to go to the ED when the blood work returned abnormal.  She was told that her glucose was in the 600s.  She denies any history of diabetes.  She has a couple alcoholic beverages weekly, but denies any history of excessive use.  She took 1 dose of Toprol prior to coming in, was recently prescribed Flexeril for a low back strain but it made her too sleepy and she has not been taking it.  She denies any other new medications.  She denies any fevers or rash.  She denies melena, hematochezia, or hematemesis.    ED Course: Upon arrival to the ED, patient is found to be afebrile, saturating well on room air, tachycardic in the 110s, and with stable blood pressure.  Chemistry panel is notable for glucose of 433 with  bicarbonate of 21 and normal anion gap.  CBC features a hemoglobin of 6.4 and platelets of 74,000.  Urinalysis notable for glucosuria and ketonuria.  Fecal occult blood testing is negative.  Patient was given a liter of normal saline and 2 units of packed red blood cells were ordered.  Hospitalists are asked to admit for symptomatic anemia and suspected new onset diabetes.  Review of Systems:  All other systems reviewed and apart from HPI, are negative.  History reviewed. No pertinent past medical history.  Past Surgical History:  Procedure Laterality Date  . KIDNEY STONE SURGERY    . SINOSCOPY    . WISDOM TOOTH EXTRACTION       reports that she has never smoked. She has never used smokeless tobacco. She reports current alcohol use. She reports that she does not use drugs.  Allergies  Allergen Reactions  . Nsaids Anaphylaxis    Family History  Problem Relation Age of Onset  . Allergic rhinitis Neg Hx   . Angioedema Neg Hx   . Asthma Neg Hx   . Atopy Neg Hx   . Eczema Neg Hx   . Immunodeficiency Neg Hx   . Urticaria Neg Hx      Prior to Admission medications   Medication Sig Start Date End Date Taking? Authorizing Provider  albuterol (PROAIR HFA) 108 (90 Base) MCG/ACT inhaler Inhale 2 puffs into the lungs every 6 (six) hours as needed for wheezing or shortness of breath. 05/16/17  Yes Padgett, Rae Halsted,  MD  cyclobenzaprine (FLEXERIL) 10 MG tablet Take 10 mg by mouth daily as needed for muscle spasms.  01/26/18  Yes [provider]  esomeprazole (NEXIUM) 40 MG capsule Take 1 capsule (40 mg total) by mouth 2 (two) times daily before a meal. 05/16/17  Yes Padgett, Rae Halsted, MD  fluticasone (FLOVENT HFA) 44 MCG/ACT inhaler Inhale 2 puffs into the lungs 2 (two) times daily. 05/16/17  Yes Padgett, Rae Halsted, MD  metoprolol succinate (TOPROL-XL) 50 MG 24 hr tablet Take 50 mg by mouth every evening. 03/10/18  Yes [provider]  montelukast  (SINGULAIR) 10 MG tablet TAKE 1 TABLET BY MOUTH EVERYDAY AT BEDTIME Patient taking differently: Take 10 mg by mouth daily.  08/12/17  Yes Padgett, Rae Halsted, MD  Multiple Vitamin (MULTIVITAMIN WITH MINERALS) TABS tablet Take 1 tablet by mouth daily.   Yes [provider]  ranitidine (ZANTAC) 300 MG tablet Take 1 tablet (300 mg total) by mouth at bedtime. Patient taking differently: Take 300 mg by mouth at bedtime as needed for heartburn.  05/16/17  Yes Padgett, Rae Halsted, MD  triamcinolone (NASACORT ALLERGY 24HR) 55 MCG/ACT AERO nasal inhaler Place 2 sprays into the nose daily.   Yes [provider]    Physical Exam: Vitals:   03/10/18 2053  BP: (!) 164/75  Pulse: (!) 116  Resp: 18  Temp: 97.9 F (36.6 C)  TempSrc: Oral  SpO2: 94%  Weight: 68.9 kg  Height: 5\' 2"  (1.575 m)    Constitutional: NAD, calm  Eyes: PERTLA, lids and conjunctivae normal ENMT: Mucous membranes are moist. Posterior pharynx clear of any exudate or lesions.   Neck: normal, supple, no masses, no thyromegaly Respiratory: clear to auscultation bilaterally, no wheezing, no crackles. Normal respiratory effort.  .  Cardiovascular: Rate ~110 and regular. No extremity edema.   Abdomen: No distension, no tenderness, soft. Bowel sounds active.   Musculoskeletal: no clubbing / cyanosis. No joint deformity upper and lower extremities.    Skin: no significant rashes, lesions, ulcers. Warm, dry, well-perfused. Neurologic: CN 2-12 grossly intact. Sensation intact. Strength 5/5 in all 4 limbs.  Psychiatric: Alert and oriented x 3. Calm, cooperative.    Labs on Admission: I have personally reviewed following labs and imaging studies  CBC: Recent Labs  Lab 03/10/18 2207  WBC 8.5  NEUTROABS 3.3  HGB 6.4*  HCT 20.5*  MCV 86.5  PLT 74*   Basic Metabolic Panel: Recent Labs  Lab 03/10/18 2207  NA 130*  K 3.4*  CL 94*  CO2 21*  GLUCOSE 433*  BUN 15  CREATININE 0.68  CALCIUM 9.1    GFR: Estimated Creatinine Clearance: 69.7 mL/min (by C-G formula based on SCr of 0.68 mg/dL). Liver Function Tests: Recent Labs  Lab 03/10/18 2207  AST 14*  ALT 13  ALKPHOS 78  BILITOT 1.2  PROT 7.1  ALBUMIN 3.7   No results for input(s): LIPASE, AMYLASE in the last 168 hours. No results for input(s): AMMONIA in the last 168 hours. Coagulation Profile: No results for input(s): INR, PROTIME in the last 168 hours. Cardiac Enzymes: No results for input(s): CKTOTAL, CKMB, CKMBINDEX, TROPONINI in the last 168 hours. BNP (last 3 results) No results for input(s): PROBNP in the last 8760 hours. HbA1C: No results for input(s): HGBA1C in the last 72 hours. CBG: Recent Labs  Lab 03/10/18 2055  GLUCAP 431*   Lipid Profile: No results for input(s): CHOL, HDL, LDLCALC, TRIG, CHOLHDL, LDLDIRECT in the last 72 hours. Thyroid Function  Tests: No results for input(s): TSH, T4TOTAL, FREET4, T3FREE, THYROIDAB in the last 72 hours. Anemia Panel: Recent Labs    03/10/18 2350  RETICCTPCT 2.8   Urine analysis:    Component Value Date/Time   COLORURINE YELLOW 03/10/2018 2207   APPEARANCEUR CLEAR 03/10/2018 2207   LABSPEC 1.028 03/10/2018 2207   PHURINE 5.0 03/10/2018 2207   GLUCOSEU >=500 (A) 03/10/2018 2207   HGBUR NEGATIVE 03/10/2018 2207   BILIRUBINUR NEGATIVE 03/10/2018 2207   KETONESUR 80 (A) 03/10/2018 2207   PROTEINUR NEGATIVE 03/10/2018 2207   NITRITE NEGATIVE 03/10/2018 2207   LEUKOCYTESUR NEGATIVE 03/10/2018 2207   Sepsis Labs: @LABRCNTIP (procalcitonin:4,lacticidven:4) )No results found for this or any previous visit (from the past 240 hour(s)).   Radiological Exams on Admission: No results found.  EKG: Not performed.   Assessment/Plan   1. Symptomatic anemia; thrombocytopenia  - Presents with insidious development of fatigue, DOE, and palpitations  - Found to have Hgb of 6.4 and platelets of 74,000; there is no prior labs for comparison   - FOBT is negative;  renal function preserved; no schistocytes reported on smear; bilirubin normal  - 2 units of RBC ordered from ED  - Check anemia panel, LDH, and post-transfusion CBC    2. Hyperglycemia  - Serum glucose is 433 in ED; she denies hx of DM  - She was treated with 1 liter saline in ED  - Check A1c, check CBG's, and use a SSI with Novolog for now    3. Asthma  - No cough or wheezing on admission  - Continue Flovent, Singulair, and as-needed albuterol    DVT prophylaxis: SCD's  Code Status: Full  Family Communication: Discussed with patient  Consults called: None Admission status: Observation     Vianne Bulls, MD [facility] Pager [phone]  If 7PM-7AM, please contact night-coverage [email or web address] Password [facility]  03/11/2018, 12:47 AM

## 2018-03-11 NOTE — Plan of Care (Addendum)
  RD consulted for nutrition education regarding diabetes.   Lab Results  Component Value Date   HGBA1C 12.9 (H) 03/10/2018    RD provided "Carbohydrate Counting for People with Diabetes" handout from the Academy of [employer] and Dietetics. Discussed different food groups and their effects on blood sugar, emphasizing carbohydrate-containing foods. Provided list of carbohydrates and recommended serving sizes of common foods.  Discussed importance of controlled and consistent carbohydrate intake throughout the day. Provided examples of ways to balance meals/snacks and encouraged intake of high-fiber, whole grain complex carbohydrates. Teach back method used.  Pt endorses eating three meals daily that consist of an english muffin with peanut butter for breakfast, a meat with vegetable or a salad for lunch, and a meat with a vegetable/grain for dinner. States she drinks soda regularly, but is willing to decrease the amount. Discussed how to make meals and snacks balanced, how to count carbohydrates, and the importance of medication compliance.   Expect fair compliance.  Body mass index is 27.97 kg/m. Pt meets criteria for overweight based on current BMI.  Current diet order is carbohydrate modified/heart healthy, patient is consuming approximately 100% of meals at this time. Labs and medications reviewed. No further nutrition interventions warranted at this time. RD contact information provided. If additional nutrition issues arise, please re-consult RD.  Stacy Shelton RD, LDN Clinical Nutrition Pager # [phone]

## 2018-03-12 ENCOUNTER — Other Ambulatory Visit: Payer: Self-pay | Admitting: Radiation Therapy

## 2018-03-12 ENCOUNTER — Observation Stay (HOSPITAL_COMMUNITY): Payer: BLUE CROSS/BLUE SHIELD

## 2018-03-12 DIAGNOSIS — D649 Anemia, unspecified: Secondary | ICD-10-CM

## 2018-03-12 DIAGNOSIS — C50919 Malignant neoplasm of unspecified site of unspecified female breast: Secondary | ICD-10-CM | POA: Diagnosis not present

## 2018-03-12 DIAGNOSIS — E119 Type 2 diabetes mellitus without complications: Secondary | ICD-10-CM | POA: Diagnosis not present

## 2018-03-12 DIAGNOSIS — Z7951 Long term (current) use of inhaled steroids: Secondary | ICD-10-CM | POA: Diagnosis not present

## 2018-03-12 DIAGNOSIS — J4540 Moderate persistent asthma, uncomplicated: Secondary | ICD-10-CM

## 2018-03-12 DIAGNOSIS — D696 Thrombocytopenia, unspecified: Secondary | ICD-10-CM

## 2018-03-12 DIAGNOSIS — E1165 Type 2 diabetes mellitus with hyperglycemia: Secondary | ICD-10-CM

## 2018-03-12 DIAGNOSIS — C7802 Secondary malignant neoplasm of left lung: Secondary | ICD-10-CM | POA: Diagnosis present

## 2018-03-12 DIAGNOSIS — G4733 Obstructive sleep apnea (adult) (pediatric): Secondary | ICD-10-CM | POA: Diagnosis present

## 2018-03-12 DIAGNOSIS — C787 Secondary malignant neoplasm of liver and intrahepatic bile duct: Secondary | ICD-10-CM | POA: Diagnosis present

## 2018-03-12 DIAGNOSIS — Z79899 Other long term (current) drug therapy: Secondary | ICD-10-CM | POA: Diagnosis not present

## 2018-03-12 DIAGNOSIS — C50912 Malignant neoplasm of unspecified site of left female breast: Secondary | ICD-10-CM | POA: Diagnosis not present

## 2018-03-12 DIAGNOSIS — C7931 Secondary malignant neoplasm of brain: Secondary | ICD-10-CM | POA: Diagnosis present

## 2018-03-12 DIAGNOSIS — C7951 Secondary malignant neoplasm of bone: Secondary | ICD-10-CM | POA: Diagnosis present

## 2018-03-12 DIAGNOSIS — Z886 Allergy status to analgesic agent status: Secondary | ICD-10-CM | POA: Diagnosis not present

## 2018-03-12 DIAGNOSIS — D61818 Other pancytopenia: Secondary | ICD-10-CM

## 2018-03-12 DIAGNOSIS — C7801 Secondary malignant neoplasm of right lung: Secondary | ICD-10-CM | POA: Diagnosis present

## 2018-03-12 DIAGNOSIS — C7800 Secondary malignant neoplasm of unspecified lung: Secondary | ICD-10-CM | POA: Diagnosis not present

## 2018-03-12 DIAGNOSIS — C50512 Malignant neoplasm of lower-outer quadrant of left female breast: Secondary | ICD-10-CM | POA: Diagnosis present

## 2018-03-12 DIAGNOSIS — I10 Essential (primary) hypertension: Secondary | ICD-10-CM | POA: Diagnosis present

## 2018-03-12 LAB — BPAM RBC
Blood Product Expiration Date: 202003062359
Blood Product Expiration Date: 202003062359
ISSUE DATE / TIME: 202002110241
ISSUE DATE / TIME: 202002110559
Unit Type and Rh: 6200
Unit Type and Rh: 6200

## 2018-03-12 LAB — TYPE AND SCREEN
ABO/RH(D): A POS
Antibody Screen: NEGATIVE
Unit division: 0
Unit division: 0

## 2018-03-12 LAB — CBC WITH DIFFERENTIAL/PLATELET
Abs Immature Granulocytes: 0.4 10*3/uL — ABNORMAL HIGH (ref 0.00–0.07)
Band Neutrophils: 1 %
Basophils Absolute: 0.2 10*3/uL — ABNORMAL HIGH (ref 0.0–0.1)
Basophils Relative: 3 %
EOS ABS: 0 10*3/uL (ref 0.0–0.5)
Eosinophils Relative: 0 %
HCT: 29.8 % — ABNORMAL LOW (ref 36.0–46.0)
Hemoglobin: 9.6 g/dL — ABNORMAL LOW (ref 12.0–15.0)
Lymphocytes Relative: 16 %
Lymphs Abs: 1 10*3/uL (ref 0.7–4.0)
MCH: 28.4 pg (ref 26.0–34.0)
MCHC: 32.2 g/dL (ref 30.0–36.0)
MCV: 88.2 fL (ref 80.0–100.0)
Monocytes Absolute: 0.5 10*3/uL (ref 0.1–1.0)
Monocytes Relative: 9 %
Myelocytes: 6 %
Neutro Abs: 3.9 10*3/uL (ref 1.7–7.7)
Neutrophils Relative %: 64 %
Platelets: 62 10*3/uL — ABNORMAL LOW (ref 150–400)
Promyelocytes Relative: 1 %
RBC: 3.38 MIL/uL — ABNORMAL LOW (ref 3.87–5.11)
RDW: 19.8 % — AB (ref 11.5–15.5)
WBC: 6 10*3/uL (ref 4.0–10.5)
nRBC: 3.5 % — ABNORMAL HIGH (ref 0.0–0.2)

## 2018-03-12 LAB — GLUCOSE, CAPILLARY
GLUCOSE-CAPILLARY: 188 mg/dL — AB (ref 70–99)
Glucose-Capillary: 287 mg/dL — ABNORMAL HIGH (ref 70–99)
Glucose-Capillary: 396 mg/dL — ABNORMAL HIGH (ref 70–99)
Glucose-Capillary: 209 mg/dL — ABNORMAL HIGH (ref 70–99)

## 2018-03-12 LAB — PROTIME-INR
INR: 1.07
Prothrombin Time: 13.8 seconds (ref 11.4–15.2)

## 2018-03-12 LAB — HIV ANTIBODY (ROUTINE TESTING W REFLEX): HIV Screen 4th Generation wRfx: NONREACTIVE

## 2018-03-12 MED ORDER — INSULIN ASPART 100 UNIT/ML ~~LOC~~ SOLN
5.0000 [IU] | Freq: Three times a day (TID) | SUBCUTANEOUS | Status: DC
  Administered 2018-03-13 (×2): 5 [IU] via SUBCUTANEOUS

## 2018-03-12 MED ORDER — LORAZEPAM 0.5 MG PO TABS
0.5000 mg | ORAL_TABLET | Freq: Once | ORAL | Status: DC
  Filled 2018-03-12 (×2): qty 1

## 2018-03-12 MED ORDER — GELATIN ABSORBABLE 12-7 MM EX MISC
CUTANEOUS | Status: AC
  Filled 2018-03-12: qty 1

## 2018-03-12 MED ORDER — MIDAZOLAM HCL 2 MG/2ML IJ SOLN
INTRAMUSCULAR | Status: AC
  Filled 2018-03-12: qty 2

## 2018-03-12 MED ORDER — INSULIN GLARGINE 100 UNIT/ML ~~LOC~~ SOLN
15.0000 [IU] | Freq: Every day | SUBCUTANEOUS | Status: DC
  Administered 2018-03-12 – 2018-03-13 (×2): 15 [IU] via SUBCUTANEOUS
  Filled 2018-03-12 (×2): qty 0.15

## 2018-03-12 MED ORDER — METOPROLOL SUCCINATE ER 50 MG PO TB24
50.0000 mg | ORAL_TABLET | Freq: Every evening | ORAL | Status: DC
  Administered 2018-03-12: 50 mg via ORAL
  Filled 2018-03-12: qty 1

## 2018-03-12 MED ORDER — FENTANYL CITRATE (PF) 100 MCG/2ML IJ SOLN
INTRAMUSCULAR | Status: AC
  Filled 2018-03-12: qty 2

## 2018-03-12 MED ORDER — LIDOCAINE HCL (PF) 1 % IJ SOLN
INTRAMUSCULAR | Status: AC | PRN
  Administered 2018-03-12: 10 mL

## 2018-03-12 MED ORDER — LIDOCAINE HCL 1 % IJ SOLN
INTRAMUSCULAR | Status: AC
  Filled 2018-03-12: qty 20

## 2018-03-12 MED ORDER — MIDAZOLAM HCL 2 MG/2ML IJ SOLN
INTRAMUSCULAR | Status: AC | PRN
  Administered 2018-03-12 (×2): 1 mg via INTRAVENOUS

## 2018-03-12 MED ORDER — FENTANYL CITRATE (PF) 100 MCG/2ML IJ SOLN
INTRAMUSCULAR | Status: AC | PRN
  Administered 2018-03-12 (×2): 50 ug via INTRAVENOUS

## 2018-03-12 MED ORDER — GADOBUTROL 1 MMOL/ML IV SOLN
7.0000 mL | Freq: Once | INTRAVENOUS | Status: AC | PRN
  Administered 2018-03-12: 7 mL via INTRAVENOUS

## 2018-03-12 NOTE — Procedures (Signed)
Interventional Radiology Procedure Note  Procedure: US Guided Biopsy of Left Lobe Liver Mass  Complications: None  Estimated Blood Loss: < 10 mL  Findings: 18 G core biopsy of 3.5 cm left lateral liver lesion performed under US guidance.  2 intact core samples obtained and sent to Pathology. Tract injected with Gelfoam pledgets on completion.  Venetia Night. Kathlene Cote, M.D Pager:  [phone]

## 2018-03-12 NOTE — Addendum Note (Signed)
Addended by: Pincus Large on: 03/12/2018 03:35 PM   Modules accepted: Orders

## 2018-03-12 NOTE — Progress Notes (Signed)
Stacy Shelton   DOB:1960/12/09   SP#:[id number]    Assessment & Plan:  Metastatic breast cancer to lungs, liver and bone until proven otherwise I have reviewed CT imaging myself and reviewed the findings with the patient Unfortunately, the clinical findings and imaging studies are most consistent with metastatic left breast cancer to lungs, liver and bone.  The metastasis to the lungs is what has been causing her to have chronic cough.  The bony metastasis is the cause of her severe pancytopenia.  Her most recent liver function is adequate. I will proceed to cancel bone marrow biopsy as I think breast biopsy is more helpful I will proceed to order MRI of the brain to complete staging She is made aware of stage IV disease is incurable but treatable  Poorly controlled newly diagnosed diabetes She will continue insulin treatment while hospitalized.  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  If biopsy can be performed today or tomorrow, she can be discharged afterwards with outpatient follow-up.  [name], MD 03/12/2018  8:29 AM   Subjective:  She feels well since last time I saw her.  She was nervous about CT findings. According to the patient, her last mammogram was 3 years ago.  She has a palpable lump on the left breast for some time but did not notice any changes.  She denies nipple discharges  Objective:  Vitals:   03/11/18 2051 03/12/18 0532  BP: (!) 123/59 123/81  Pulse: (!) 103 87  Resp: 18 19  Temp: 98.8 F (37.1 C) 98.2 F (36.8 C)  SpO2: 96% 99%     Intake/Output Summary (Last 24 hours) at 03/12/2018 6333 Last data filed at 03/12/2018 0600 Gross per 24 hour  Intake 1947.93 ml  Output -  Net 1947.93 ml    GENERAL:alert, no distress  and comfortable NEURO: alert & oriented x 3 with fluent speech, no focal motor/sensory deficits Left breast exam was performed.  Palpable lump in the left lower quadrant measure approximately 2 cm in size.   Labs:  Lab Results  Component Value Date   WBC 6.7 03/11/2018   HGB 9.1 (L) 03/11/2018   HCT 28.7 (L) 03/11/2018   MCV 86.4 03/11/2018   PLT 60 (L) 03/11/2018   NEUTROABS 2.6 03/11/2018    Lab Results  Component Value Date   NA 128 (L) 03/11/2018   K 3.4 (L) 03/11/2018   CL 92 (L) 03/11/2018   CO2 20 (L) 03/11/2018    Studies:  Ct Chest W Contrast  Result Date: 03/11/2018 CLINICAL DATA:  Weight loss in thrombocytopenia. EXAM: CT CHEST, ABDOMEN, AND PELVIS WITH CONTRAST TECHNIQUE: Multidetector CT imaging of the chest, abdomen and pelvis was performed following the standard protocol during bolus administration of intravenous contrast. CONTRAST:  166m OMNIPAQUE IOHEXOL 300 MG/ML SOLN, 345mOMNIPAQUE IOHEXOL 300 MG/ML SOLN COMPARISON:  None. FINDINGS: CT CHEST FINDINGS Cardiovascular: The heart size is normal. No substantial pericardial effusion. Atherosclerotic calcification is noted in the wall of the thoracic aorta. Mediastinum/Nodes: 13 mm short axis precarinal lymph node mildly enlarged. 13 mm densely calcified subcarinal lymph node is associated with calcified nodal tissue in the right hilum. No left hilar lymphadenopathy. 9 mm short axis left axillary lymph node is associated with small additional  left axillary and subpectoral nodes. No right hilar adenopathy. Lungs/Pleura: The central tracheobronchial airways are patent. 2.7 x 2.2 cm central right upper lobe nodule identified on 56/4. Numerous additional pulmonary nodules are identified bilaterally ranging in size from several mm up to 10 mm in the right upper lobe. These nodules have a slight upper lung predominance. Index 7 mm right lower lobe nodule identified on 64/4. No pleural effusion. Calcified granuloma noted posterior  right costophrenic sulcus. Musculoskeletal: No worrisome lytic or sclerotic osseous abnormality. Masslike asymmetry of left breast tissue identified in the medial inferior quadrant of the left breast (40/2) measuring 2.6 x 2.3 cm. Coronal imaging suggests that this lesion has spiculated margins (22/5). CT ABDOMEN PELVIS FINDINGS Hepatobiliary: Multiple (approximately 10) ill-defined liver lesions are concerning for metastases. Dominant lesions are a 3.6 cm lesion in the right liver (54/2) and a 3.8 cm lesion identified lateral segment left liver (58/2). Other lesions range in size from 8 mm up to 2.6 cm. There is no evidence for gallstones, gallbladder wall thickening, or pericholecystic fluid. No intrahepatic or extrahepatic biliary dilation. Pancreas: No focal mass lesion. No dilatation of the main duct. No intraparenchymal cyst. No peripancreatic edema. Spleen: Calcified granulomata. Adrenals/Urinary Tract: No adrenal nodule or mass. Kidneys unremarkable. Central sinus cysts noted in the left kidney. No evidence for hydroureter. The urinary bladder appears normal for the degree of distention. Stomach/Bowel: Stomach is unremarkable. No gastric wall thickening. No evidence of outlet obstruction. Duodenum is normally positioned as is the ligament of Treitz. No small bowel wall thickening. No small bowel dilatation. The terminal ileum is normal. The appendix is not visualized, but there is no edema or inflammation in the region of the cecum. No gross colonic mass. No colonic wall thickening. Vascular/Lymphatic: There is abdominal aortic atherosclerosis without aneurysm. There is no gastrohepatic or hepatoduodenal ligament lymphadenopathy. No intraperitoneal or retroperitoneal lymphadenopathy. No pelvic sidewall lymphadenopathy. Reproductive: The uterus is unremarkable.  There is no adnexal mass. Other: No intraperitoneal free fluid. Musculoskeletal: No worrisome lytic or sclerotic osseous abnormality. IMPRESSION: 1.  2.6 x 2.3 cm spiculated mass identified inferomedial quadrant of the left breast. Lesion appears to retract the overlying skin. Mammographic correlation recommended. 2. Small lymph nodes in the left axillar ill-defined and suspicious for metastatic disease. 3. Multiple bilateral pulmonary nodules measuring up to 2.7 cm diameter. These probably represent metastatic disease. Given the dominant size of the central right upper lobe lesion, synchronous lung primary can not be completely excluded. 4. Multiple ill-defined liver lesions compatible with metastatic disease. Dominant liver metastases measure up to almost 4 cm. 5.  Aortic Atherosclerois (ICD10-170.0) Electronically Signed   By: Misty Stanley M.D.   On: 03/11/2018 18:30   Ct Abdomen Pelvis W Contrast  Result Date: 03/11/2018 CLINICAL DATA:  Weight loss in thrombocytopenia. EXAM: CT CHEST, ABDOMEN, AND PELVIS WITH CONTRAST TECHNIQUE: Multidetector CT imaging of the chest, abdomen and pelvis was performed following the standard protocol during bolus administration of intravenous contrast. CONTRAST:  185m OMNIPAQUE IOHEXOL 300 MG/ML SOLN, 363mOMNIPAQUE IOHEXOL 300 MG/ML SOLN COMPARISON:  None. FINDINGS: CT CHEST FINDINGS Cardiovascular: The heart size is normal. No substantial pericardial effusion. Atherosclerotic calcification is noted in the wall of the thoracic aorta. Mediastinum/Nodes: 13 mm short axis precarinal lymph node mildly enlarged. 13 mm densely calcified subcarinal lymph node is associated with calcified nodal tissue in the right hilum. No left hilar lymphadenopathy. 9 mm short axis left axillary lymph node is associated with small additional left axillary and subpectoral  nodes. No right hilar adenopathy. Lungs/Pleura: The central tracheobronchial airways are patent. 2.7 x 2.2 cm central right upper lobe nodule identified on 56/4. Numerous additional pulmonary nodules are identified bilaterally ranging in size from several mm up to 10 mm in the  right upper lobe. These nodules have a slight upper lung predominance. Index 7 mm right lower lobe nodule identified on 64/4. No pleural effusion. Calcified granuloma noted posterior right costophrenic sulcus. Musculoskeletal: No worrisome lytic or sclerotic osseous abnormality. Masslike asymmetry of left breast tissue identified in the medial inferior quadrant of the left breast (40/2) measuring 2.6 x 2.3 cm. Coronal imaging suggests that this lesion has spiculated margins (22/5). CT ABDOMEN PELVIS FINDINGS Hepatobiliary: Multiple (approximately 10) ill-defined liver lesions are concerning for metastases. Dominant lesions are a 3.6 cm lesion in the right liver (54/2) and a 3.8 cm lesion identified lateral segment left liver (58/2). Other lesions range in size from 8 mm up to 2.6 cm. There is no evidence for gallstones, gallbladder wall thickening, or pericholecystic fluid. No intrahepatic or extrahepatic biliary dilation. Pancreas: No focal mass lesion. No dilatation of the main duct. No intraparenchymal cyst. No peripancreatic edema. Spleen: Calcified granulomata. Adrenals/Urinary Tract: No adrenal nodule or mass. Kidneys unremarkable. Central sinus cysts noted in the left kidney. No evidence for hydroureter. The urinary bladder appears normal for the degree of distention. Stomach/Bowel: Stomach is unremarkable. No gastric wall thickening. No evidence of outlet obstruction. Duodenum is normally positioned as is the ligament of Treitz. No small bowel wall thickening. No small bowel dilatation. The terminal ileum is normal. The appendix is not visualized, but there is no edema or inflammation in the region of the cecum. No gross colonic mass. No colonic wall thickening. Vascular/Lymphatic: There is abdominal aortic atherosclerosis without aneurysm. There is no gastrohepatic or hepatoduodenal ligament lymphadenopathy. No intraperitoneal or retroperitoneal lymphadenopathy. No pelvic sidewall lymphadenopathy.  Reproductive: The uterus is unremarkable.  There is no adnexal mass. Other: No intraperitoneal free fluid. Musculoskeletal: No worrisome lytic or sclerotic osseous abnormality. IMPRESSION: 1. 2.6 x 2.3 cm spiculated mass identified inferomedial quadrant of the left breast. Lesion appears to retract the overlying skin. Mammographic correlation recommended. 2. Small lymph nodes in the left axillar ill-defined and suspicious for metastatic disease. 3. Multiple bilateral pulmonary nodules measuring up to 2.7 cm diameter. These probably represent metastatic disease. Given the dominant size of the central right upper lobe lesion, synchronous lung primary can not be completely excluded. 4. Multiple ill-defined liver lesions compatible with metastatic disease. Dominant liver metastases measure up to almost 4 cm. 5.  Aortic Atherosclerois (ICD10-170.0) Electronically Signed   By: Misty Stanley M.D.   On: 03/11/2018 18:30

## 2018-03-12 NOTE — Progress Notes (Signed)
Patient ID: Stacy Shelton, female   DOB: 1960/09/09, 58 y.o.   MRN: [id number] Patient originally scheduled for CT-guided bone marrow biopsy by oncology. See consult note done 2/11 for additional details.  Follow-up imaging has now revealed left breast mass, multiple bilateral pulmonary nodules, and multiple liver lesions.  Bone marrow biopsy canceled and request now received from oncology for image guided liver lesion biopsy.  Plts today 62k, PT/INR nl. Imaging studies have been reviewed by Dr. Kathlene Cote.  Risks and benefits of procedure was discussed with the patient and/or patient's family including, but not limited to bleeding, infection, damage to adjacent structures or low yield requiring additional tests.  All of the questions were answered and there is agreement to proceed.  Consent signed and in chart.  Procedure scheduled for later this afternoon.

## 2018-03-12 NOTE — Progress Notes (Signed)
PROGRESS NOTE    Stacy Shelton  OXB:[id number] DOB: 09/16/1960 DOA: 03/10/2018 PCP: Robyne Peers, MD    Brief Narrative:  58 y.o. female with medical history significant for asthma, now presenting to emergency department for evaluation of palpitations and abnormal outpatient blood work.  Patient reports being in her usual state of health until approximately 2 weeks ago when she noted the insidious development of exertional dyspnea, fatigue, and palpitations.  She had suffered a viral URI in November with fevers and cough, but that seemed to have resolved completely when she noted becoming fatigued with only minimal exertion approximately 2 weeks ago.  She describes having to lay down and rest after very light housework.  She also developed palpitations around that time that has been intermittent.  She denies any cough or wheezing recently, but becomes short of breath with minimal activity.  All of the symptoms have been slowly worsening over the past couple weeks, she was evaluated in an outpatient clinic today, was sent for some basic blood work, started on Toprol for suspected hyperthyroidism, and was instructed to go to the ED when the blood work returned abnormal.  She was told that her glucose was in the 600s.  She denies any history of diabetes.  She has a couple alcoholic beverages weekly, but denies any history of excessive use.  She took 1 dose of Toprol prior to coming in, was recently prescribed Flexeril for a low back strain but it made her too sleepy and she has not been taking it.  She denies any other new medications.  She denies any fevers or rash.  She denies melena, hematochezia, or hematemesis.    ED Course: Upon arrival to the ED, patient is found to be afebrile, saturating well on room air, tachycardic in the 110s, and with stable blood pressure.  Chemistry panel is notable for glucose of 433 with bicarbonate of 21 and normal anion gap.  CBC features a hemoglobin of 6.4 and  platelets of 74,000.  Urinalysis notable for glucosuria and ketonuria.  Fecal occult blood testing is negative.  Patient was given a liter of normal saline and 2 units of packed red blood cells were ordered.  Hospitalists are asked to admit for symptomatic anemia and suspected new onset diabetes.  Assessment & Plan:   Principal Problem:   Symptomatic anemia Active Problems:   Thrombocytopenia ([place])   Uncontrolled diabetes mellitus with hyperglycemia ([facility])   Moderate persistent asthma   1. Symptomatic anemia; thrombocytopenia, likely secondary to malignancy - Presents with insidious development of fatigue, DOE, and palpitations  - Found to have Hgb of 6.4 and platelets of 74,000 - FOBT is negative; renal function preserved; no schistocytes reported on smear; bilirubin normal  - given 2 units PRBC transfusion - Repeat CBC in AM, continue to transfuse as needed  2. diabetes, new diagnosis - Serum glucose is 433 in ED - She was treated with 1 liter saline in ED  - A1c noted to be 12.9. Pt continued on insulin, titrate accordingly  3. Asthma  - No cough or wheezing on admission  - Continue Flovent, Singulair, and as-needed albuterol  -On minimal O2 support currently  4. Metastatic breast cancer with disease to lungs, liver, bone, brain -MRI brain reviewed. 1.5cm occipital lesion noted worrisome for malignancy. Have discussed case with Neuro-Oncologist who plans to discuss pt during tumor board next week, involving Rad Onc, Neurosurgery -Discussed case with Oncology. For liver biopsy today  DVT prophylaxis: SCD's Code Status:  Full Family Communication: Pt in room, family not at bedside Disposition Plan: Uncertain at this time  Consultants:   Oncology  Discussed case with Dr. Mickeal Skinner  Procedures:     Antimicrobials: Anti-infectives (From admission, onward)   None       Subjective: Anxious about diagnosis this AM  Objective: Vitals:   03/12/18 1435 03/12/18 1440  03/12/18 1445 03/12/18 1450  BP: 138/71 133/60 133/66 135/65  Pulse: 97 90 92 90  Resp: (!) 23 17 19  (!) 22  Temp:      TempSrc:      SpO2: 99% 98% 98% 98%  Weight:      Height:        Intake/Output Summary (Last 24 hours) at 03/12/2018 1509 Last data filed at 03/12/2018 1500 Gross per 24 hour  Intake 1625.82 ml  Output -  Net 1625.82 ml   Filed Weights   03/10/18 2053 03/11/18 0206  Weight: 68.9 kg 70.5 kg    Examination:  General exam: Appears calm and comfortable  Respiratory system: Clear to auscultation. Respiratory effort normal. Cardiovascular system: S1 & S2 heard, RRR Gastrointestinal system: Abdomen is nondistended, soft and nontender. No organomegaly or masses felt. Normal bowel sounds heard. Central nervous system: Alert and oriented. No focal neurological deficits. Extremities: Symmetric 5 x 5 power. Skin: No rashes, lesions  Psychiatry: Judgement and insight appear normal. Mood & affect appropriate.   Data Reviewed: I have personally reviewed following labs and imaging studies  CBC: Recent Labs  Lab 03/10/18 2207 03/11/18 1235 03/12/18 0941  WBC 8.5 6.7 6.0  NEUTROABS 3.3 2.6 3.9  HGB 6.4* 9.1* 9.6*  HCT 20.5* 28.7* 29.8*  MCV 86.5 86.4 88.2  PLT 74* 60* 62*   Basic Metabolic Panel: Recent Labs  Lab 03/10/18 2207 03/11/18 0013  NA 130* 128*  K 3.4* 3.4*  CL 94* 92*  CO2 21* 20*  GLUCOSE 433* 425*  BUN 15 14  CREATININE 0.68 0.67  CALCIUM 9.1 9.0   GFR: Estimated Creatinine Clearance: 71.4 mL/min (by C-G formula based on SCr of 0.67 mg/dL). Liver Function Tests: Recent Labs  Lab 03/10/18 2207  AST 14*  ALT 13  ALKPHOS 78  BILITOT 1.2  PROT 7.1  ALBUMIN 3.7   No results for input(s): LIPASE, AMYLASE in the last 168 hours. No results for input(s): AMMONIA in the last 168 hours. Coagulation Profile: Recent Labs  Lab 03/12/18 0941  INR 1.07   Cardiac Enzymes: No results for input(s): CKTOTAL, CKMB, CKMBINDEX, TROPONINI in the  last 168 hours. BNP (last 3 results) No results for input(s): PROBNP in the last 8760 hours. HbA1C: Recent Labs    03/10/18 2350  HGBA1C 12.9*   CBG: Recent Labs  Lab 03/11/18 1229 03/11/18 1634 03/11/18 2049 03/12/18 0736 03/12/18 1130  GLUCAP 221* 311* 247* 287* 396*   Lipid Profile: No results for input(s): CHOL, HDL, LDLCALC, TRIG, CHOLHDL, LDLDIRECT in the last 72 hours. Thyroid Function Tests: Recent Labs    03/11/18 0100  TSH 3.103   Anemia Panel: Recent Labs    03/10/18 2350  VITAMINB12 2,002*  FOLATE 14.1  FERRITIN 913*  TIBC 233*  IRON 83  RETICCTPCT 2.8   Sepsis Labs: No results for input(s): PROCALCITON, LATICACIDVEN in the last 168 hours.  No results found for this or any previous visit (from the past 240 hour(s)).   Radiology Studies: Ct Chest W Contrast  Result Date: 03/11/2018 CLINICAL DATA:  Weight loss in thrombocytopenia. EXAM: CT CHEST, ABDOMEN, AND  PELVIS WITH CONTRAST TECHNIQUE: Multidetector CT imaging of the chest, abdomen and pelvis was performed following the standard protocol during bolus administration of intravenous contrast. CONTRAST:  122mL OMNIPAQUE IOHEXOL 300 MG/ML SOLN, 67mL OMNIPAQUE IOHEXOL 300 MG/ML SOLN COMPARISON:  None. FINDINGS: CT CHEST FINDINGS Cardiovascular: The heart size is normal. No substantial pericardial effusion. Atherosclerotic calcification is noted in the wall of the thoracic aorta. Mediastinum/Nodes: 13 mm short axis precarinal lymph node mildly enlarged. 13 mm densely calcified subcarinal lymph node is associated with calcified nodal tissue in the right hilum. No left hilar lymphadenopathy. 9 mm short axis left axillary lymph node is associated with small additional left axillary and subpectoral nodes. No right hilar adenopathy. Lungs/Pleura: The central tracheobronchial airways are patent. 2.7 x 2.2 cm central right upper lobe nodule identified on 56/4. Numerous additional pulmonary nodules are identified  bilaterally ranging in size from several mm up to 10 mm in the right upper lobe. These nodules have a slight upper lung predominance. Index 7 mm right lower lobe nodule identified on 64/4. No pleural effusion. Calcified granuloma noted posterior right costophrenic sulcus. Musculoskeletal: No worrisome lytic or sclerotic osseous abnormality. Masslike asymmetry of left breast tissue identified in the medial inferior quadrant of the left breast (40/2) measuring 2.6 x 2.3 cm. Coronal imaging suggests that this lesion has spiculated margins (22/5). CT ABDOMEN PELVIS FINDINGS Hepatobiliary: Multiple (approximately 10) ill-defined liver lesions are concerning for metastases. Dominant lesions are a 3.6 cm lesion in the right liver (54/2) and a 3.8 cm lesion identified lateral segment left liver (58/2). Other lesions range in size from 8 mm up to 2.6 cm. There is no evidence for gallstones, gallbladder wall thickening, or pericholecystic fluid. No intrahepatic or extrahepatic biliary dilation. Pancreas: No focal mass lesion. No dilatation of the main duct. No intraparenchymal cyst. No peripancreatic edema. Spleen: Calcified granulomata. Adrenals/Urinary Tract: No adrenal nodule or mass. Kidneys unremarkable. Central sinus cysts noted in the left kidney. No evidence for hydroureter. The urinary bladder appears normal for the degree of distention. Stomach/Bowel: Stomach is unremarkable. No gastric wall thickening. No evidence of outlet obstruction. Duodenum is normally positioned as is the ligament of Treitz. No small bowel wall thickening. No small bowel dilatation. The terminal ileum is normal. The appendix is not visualized, but there is no edema or inflammation in the region of the cecum. No gross colonic mass. No colonic wall thickening. Vascular/Lymphatic: There is abdominal aortic atherosclerosis without aneurysm. There is no gastrohepatic or hepatoduodenal ligament lymphadenopathy. No intraperitoneal or retroperitoneal  lymphadenopathy. No pelvic sidewall lymphadenopathy. Reproductive: The uterus is unremarkable.  There is no adnexal mass. Other: No intraperitoneal free fluid. Musculoskeletal: No worrisome lytic or sclerotic osseous abnormality. IMPRESSION: 1. 2.6 x 2.3 cm spiculated mass identified inferomedial quadrant of the left breast. Lesion appears to retract the overlying skin. Mammographic correlation recommended. 2. Small lymph nodes in the left axillar ill-defined and suspicious for metastatic disease. 3. Multiple bilateral pulmonary nodules measuring up to 2.7 cm diameter. These probably represent metastatic disease. Given the dominant size of the central right upper lobe lesion, synchronous lung primary can not be completely excluded. 4. Multiple ill-defined liver lesions compatible with metastatic disease. Dominant liver metastases measure up to almost 4 cm. 5.  Aortic Atherosclerois (ICD10-170.0) Electronically Signed   By: Misty Stanley M.D.   On: 03/11/2018 18:30   Mr Jeri Cos PF Contrast  Result Date: 03/12/2018 CLINICAL DATA:  Metastatic breast cancer.  Gait disturbance. EXAM: MRI HEAD WITHOUT AND WITH CONTRAST  TECHNIQUE: Multiplanar, multiecho pulse sequences of the brain and surrounding structures were obtained without and with intravenous contrast. CONTRAST:  Gadavist 7 mL. COMPARISON:  None. FINDINGS: Brain: 13 x 14 x 13 mm metastasis, RIGHT lateral occipital lobe, mild surrounding edema. No midline shift. No other definite metastatic lesions. No acute stroke, hemorrhage hydrocephalus. Slight premature for age white matter disease. Other than the necrotic metastasis, no abnormal enhancement of the brain. Borderline pachymeningeal enhancement of a symmetric nature, could be related to the superficial metastasis. No leptomeningeal enhancement. Vascular: Normal flow voids. Skull and upper cervical spine: Diffusely abnormal marrow in the clivus and upper cervical region, concerning for metastatic disease.  Sinuses/Orbits: No significant sinus disease.  Negative orbits. Other: None. IMPRESSION: Single 1.3 x 1.4 cm RIGHT occipital metastasis. Borderline pachymeningeal enhancement of symmetric nature could be related to the superficial metastasis or osseous disease. A call has been placed to the ordering provider. Electronically Signed   By: Staci Righter M.D.   On: 03/12/2018 13:28   Ct Abdomen Pelvis W Contrast  Result Date: 03/11/2018 CLINICAL DATA:  Weight loss in thrombocytopenia. EXAM: CT CHEST, ABDOMEN, AND PELVIS WITH CONTRAST TECHNIQUE: Multidetector CT imaging of the chest, abdomen and pelvis was performed following the standard protocol during bolus administration of intravenous contrast. CONTRAST:  133mL OMNIPAQUE IOHEXOL 300 MG/ML SOLN, 72mL OMNIPAQUE IOHEXOL 300 MG/ML SOLN COMPARISON:  None. FINDINGS: CT CHEST FINDINGS Cardiovascular: The heart size is normal. No substantial pericardial effusion. Atherosclerotic calcification is noted in the wall of the thoracic aorta. Mediastinum/Nodes: 13 mm short axis precarinal lymph node mildly enlarged. 13 mm densely calcified subcarinal lymph node is associated with calcified nodal tissue in the right hilum. No left hilar lymphadenopathy. 9 mm short axis left axillary lymph node is associated with small additional left axillary and subpectoral nodes. No right hilar adenopathy. Lungs/Pleura: The central tracheobronchial airways are patent. 2.7 x 2.2 cm central right upper lobe nodule identified on 56/4. Numerous additional pulmonary nodules are identified bilaterally ranging in size from several mm up to 10 mm in the right upper lobe. These nodules have a slight upper lung predominance. Index 7 mm right lower lobe nodule identified on 64/4. No pleural effusion. Calcified granuloma noted posterior right costophrenic sulcus. Musculoskeletal: No worrisome lytic or sclerotic osseous abnormality. Masslike asymmetry of left breast tissue identified in the medial  inferior quadrant of the left breast (40/2) measuring 2.6 x 2.3 cm. Coronal imaging suggests that this lesion has spiculated margins (22/5). CT ABDOMEN PELVIS FINDINGS Hepatobiliary: Multiple (approximately 10) ill-defined liver lesions are concerning for metastases. Dominant lesions are a 3.6 cm lesion in the right liver (54/2) and a 3.8 cm lesion identified lateral segment left liver (58/2). Other lesions range in size from 8 mm up to 2.6 cm. There is no evidence for gallstones, gallbladder wall thickening, or pericholecystic fluid. No intrahepatic or extrahepatic biliary dilation. Pancreas: No focal mass lesion. No dilatation of the main duct. No intraparenchymal cyst. No peripancreatic edema. Spleen: Calcified granulomata. Adrenals/Urinary Tract: No adrenal nodule or mass. Kidneys unremarkable. Central sinus cysts noted in the left kidney. No evidence for hydroureter. The urinary bladder appears normal for the degree of distention. Stomach/Bowel: Stomach is unremarkable. No gastric wall thickening. No evidence of outlet obstruction. Duodenum is normally positioned as is the ligament of Treitz. No small bowel wall thickening. No small bowel dilatation. The terminal ileum is normal. The appendix is not visualized, but there is no edema or inflammation in the region of the cecum. No  gross colonic mass. No colonic wall thickening. Vascular/Lymphatic: There is abdominal aortic atherosclerosis without aneurysm. There is no gastrohepatic or hepatoduodenal ligament lymphadenopathy. No intraperitoneal or retroperitoneal lymphadenopathy. No pelvic sidewall lymphadenopathy. Reproductive: The uterus is unremarkable.  There is no adnexal mass. Other: No intraperitoneal free fluid. Musculoskeletal: No worrisome lytic or sclerotic osseous abnormality. IMPRESSION: 1. 2.6 x 2.3 cm spiculated mass identified inferomedial quadrant of the left breast. Lesion appears to retract the overlying skin. Mammographic correlation  recommended. 2. Small lymph nodes in the left axillar ill-defined and suspicious for metastatic disease. 3. Multiple bilateral pulmonary nodules measuring up to 2.7 cm diameter. These probably represent metastatic disease. Given the dominant size of the central right upper lobe lesion, synchronous lung primary can not be completely excluded. 4. Multiple ill-defined liver lesions compatible with metastatic disease. Dominant liver metastases measure up to almost 4 cm. 5.  Aortic Atherosclerois (ICD10-170.0) Electronically Signed   By: Misty Stanley M.D.   On: 03/11/2018 18:30    Scheduled Meds: . budesonide (PULMICORT) nebulizer solution  0.25 mg Nebulization BID  . gelatin adsorbable      . insulin aspart  0-5 Units Subcutaneous QHS  . insulin aspart  0-9 Units Subcutaneous TID WC  . insulin aspart  5 Units Subcutaneous TID WC  . insulin glargine  15 Units Subcutaneous Daily  . lidocaine      . LORazepam  0.5 mg Oral Once  . metoprolol succinate  50 mg Oral QPM  . montelukast  10 mg Oral QHS  . pantoprazole  40 mg Oral BID   Continuous Infusions: . sodium chloride    . sodium chloride 75 mL/hr at 03/12/18 1323     LOS: 0 days   Marylu Lund, MD [facility] Pager On Amion  If 7PM-7AM, please contact night-coverage 03/12/2018, 3:09 PM

## 2018-03-12 NOTE — Progress Notes (Signed)
Report received from Tyson Babinski, RN. No change from initial pm assessment. Will continue to monitor and follow the POC.

## 2018-03-12 NOTE — Plan of Care (Signed)
  Problem: Coping: Goal: Level of anxiety will decrease Outcome: Progressing   Problem: Pain Managment: Goal: General experience of comfort will improve Outcome: Progressing   

## 2018-03-13 ENCOUNTER — Ambulatory Visit
Admission: RE | Admit: 2018-03-13 | Discharge: 2018-03-13 | Disposition: A | Discharge status: Home / Self Care | Payer: BLUE CROSS/BLUE SHIELD | Source: Ambulatory Visit | Attending: Radiation Oncology | Admitting: Radiation Oncology

## 2018-03-13 ENCOUNTER — Other Ambulatory Visit: Payer: Self-pay | Admitting: Radiation Therapy

## 2018-03-13 DIAGNOSIS — C7949 Secondary malignant neoplasm of other parts of nervous system: Principal | ICD-10-CM

## 2018-03-13 DIAGNOSIS — C7931 Secondary malignant neoplasm of brain: Secondary | ICD-10-CM

## 2018-03-13 DIAGNOSIS — C50919 Malignant neoplasm of unspecified site of unspecified female breast: Secondary | ICD-10-CM

## 2018-03-13 DIAGNOSIS — E119 Type 2 diabetes mellitus without complications: Secondary | ICD-10-CM

## 2018-03-13 LAB — GLUCOSE, CAPILLARY
Glucose-Capillary: 235 mg/dL — ABNORMAL HIGH (ref 70–99)
Glucose-Capillary: 317 mg/dL — ABNORMAL HIGH (ref 70–99)

## 2018-03-13 MED ORDER — INSULIN DETEMIR 100 UNIT/ML FLEXPEN: 20.0000 [IU] | PEN_INJECTOR | Freq: Every day | SUBCUTANEOUS | 0 refills | Status: DC

## 2018-03-13 MED ORDER — INSULIN ASPART 100 UNIT/ML FLEXPEN: 8.0000 [IU] | PEN_INJECTOR | Freq: Three times a day (TID) | SUBCUTANEOUS | 0 refills | Status: DC

## 2018-03-13 MED ORDER — BLOOD GLUCOSE MONITOR KIT: PACK | 0 refills | Status: AC

## 2018-03-13 MED ORDER — INSULIN PEN NEEDLE 31G X 5 MM MISC: 1.0000 | Freq: Four times a day (QID) | 0 refills | Status: AC

## 2018-03-13 MED ORDER — INSULIN GLARGINE 100 UNIT/ML SOLOSTAR PEN: 20.0000 [IU] | PEN_INJECTOR | Freq: Every day | SUBCUTANEOUS | 0 refills | Status: DC

## 2018-03-13 NOTE — Progress Notes (Signed)
WEnt over discharge papers with patient.  All questions answered.  Paperwork and prescriptions given.  Wheeled out via [employer], broadcasting.

## 2018-03-13 NOTE — Progress Notes (Signed)
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  The metastasis to the lungs is what has been causing her to have chronic cough.  The bony metastasis is the cause of her severe pancytopenia.  Her most recent liver function is adequate. She is made aware of stage IV disease is incurable but treatable She underwent liver biopsy on 2 /12/2018.  Result is pending. Her case will be discussed at the brain tumor board on 03/17/2018.  I plan to see her back in my office later that day to discuss plan of care and hopefully biopsy result will be available  Poorly controlled newly diagnosed diabetes She will continue insulin treatment while hospitalized.   She would like to go home on insulin therapy in the short-term.  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  I have already set up appointment to see her next week.  We will sign off.  [name], MD 03/13/2018  10:30 AM   Subjective:  She tolerated liver biopsy well.  She is not symptomatic from brain involvement.  No headaches or weakness.  She still have nonproductive cough.  Her blood sugar control has improved  Objective:  Vitals:   03/13/18 0607 03/13/18 0856  BP: 134/70 126/65  Pulse: (!) 102 100  Resp: 20 19  Temp: 100.3 F (37.9 C) 98.8 F (37.1 C)  SpO2: 95% 96%     Intake/Output Summary (Last 24 hours) at 03/13/2018 1030 Last data filed at 03/13/2018 1011 Gross per 24 hour  Intake 1821.95 ml  Output -  Net 1821.95 ml    GENERAL:alert, no  distress and comfortable  NEURO: alert & oriented x 3 with fluent speech, no focal motor/sensory deficits   Labs:  Lab Results  Component Value Date   WBC 6.0 03/12/2018   HGB 9.6 (L) 03/12/2018   HCT 29.8 (L) 03/12/2018   MCV 88.2 03/12/2018   PLT 62 (L) 03/12/2018   NEUTROABS 3.9 03/12/2018    Lab Results  Component Value Date   NA 128 (L) 03/11/2018   K 3.4 (L) 03/11/2018   CL 92 (L) 03/11/2018   CO2 20 (L) 03/11/2018    Studies:  Ct Chest W Contrast  Result Date: 03/11/2018 CLINICAL DATA:  Weight loss in thrombocytopenia. EXAM: CT CHEST, ABDOMEN, AND PELVIS WITH CONTRAST TECHNIQUE: Multidetector CT imaging of the chest, abdomen and pelvis was performed following the standard protocol during bolus administration of intravenous contrast. CONTRAST:  122mL OMNIPAQUE IOHEXOL 300 MG/ML SOLN, 75mL OMNIPAQUE IOHEXOL 300 MG/ML SOLN COMPARISON:  None. FINDINGS: CT CHEST FINDINGS Cardiovascular: The heart size is normal. No substantial pericardial effusion. Atherosclerotic calcification is noted in the wall of the thoracic aorta. Mediastinum/Nodes: 13 mm short axis precarinal lymph node mildly enlarged. 13 mm densely calcified subcarinal lymph node is associated with calcified nodal tissue in the right hilum. No left hilar lymphadenopathy. 9 mm short axis left axillary lymph node is associated with small additional left axillary and subpectoral nodes. No right hilar adenopathy. Lungs/Pleura: The central tracheobronchial airways are patent.  2.7 x 2.2 cm central right upper lobe nodule identified on 56/4. Numerous additional pulmonary nodules are identified bilaterally ranging in size from several mm up to 10 mm in the right upper lobe. These nodules have a slight upper lung predominance. Index 7 mm right lower lobe nodule identified on 64/4. No pleural effusion. Calcified granuloma noted posterior right costophrenic sulcus. Musculoskeletal: No worrisome lytic or sclerotic osseous abnormality.  Masslike asymmetry of left breast tissue identified in the medial inferior quadrant of the left breast (40/2) measuring 2.6 x 2.3 cm. Coronal imaging suggests that this lesion has spiculated margins (22/5). CT ABDOMEN PELVIS FINDINGS Hepatobiliary: Multiple (approximately 10) ill-defined liver lesions are concerning for metastases. Dominant lesions are a 3.6 cm lesion in the right liver (54/2) and a 3.8 cm lesion identified lateral segment left liver (58/2). Other lesions range in size from 8 mm up to 2.6 cm. There is no evidence for gallstones, gallbladder wall thickening, or pericholecystic fluid. No intrahepatic or extrahepatic biliary dilation. Pancreas: No focal mass lesion. No dilatation of the main duct. No intraparenchymal cyst. No peripancreatic edema. Spleen: Calcified granulomata. Adrenals/Urinary Tract: No adrenal nodule or mass. Kidneys unremarkable. Central sinus cysts noted in the left kidney. No evidence for hydroureter. The urinary bladder appears normal for the degree of distention. Stomach/Bowel: Stomach is unremarkable. No gastric wall thickening. No evidence of outlet obstruction. Duodenum is normally positioned as is the ligament of Treitz. No small bowel wall thickening. No small bowel dilatation. The terminal ileum is normal. The appendix is not visualized, but there is no edema or inflammation in the region of the cecum. No gross colonic mass. No colonic wall thickening. Vascular/Lymphatic: There is abdominal aortic atherosclerosis without aneurysm. There is no gastrohepatic or hepatoduodenal ligament lymphadenopathy. No intraperitoneal or retroperitoneal lymphadenopathy. No pelvic sidewall lymphadenopathy. Reproductive: The uterus is unremarkable.  There is no adnexal mass. Other: No intraperitoneal free fluid. Musculoskeletal: No worrisome lytic or sclerotic osseous abnormality. IMPRESSION: 1. 2.6 x 2.3 cm spiculated mass identified inferomedial quadrant of the left breast. Lesion appears  to retract the overlying skin. Mammographic correlation recommended. 2. Small lymph nodes in the left axillar ill-defined and suspicious for metastatic disease. 3. Multiple bilateral pulmonary nodules measuring up to 2.7 cm diameter. These probably represent metastatic disease. Given the dominant size of the central right upper lobe lesion, synchronous lung primary can not be completely excluded. 4. Multiple ill-defined liver lesions compatible with metastatic disease. Dominant liver metastases measure up to almost 4 cm. 5.  Aortic Atherosclerois (ICD10-170.0) Electronically Signed   By: Misty Stanley M.D.   On: 03/11/2018 18:30   Mr Jeri Cos UE Contrast  Result Date: 03/12/2018 CLINICAL DATA:  Metastatic breast cancer.  Gait disturbance. EXAM: MRI HEAD WITHOUT AND WITH CONTRAST TECHNIQUE: Multiplanar, multiecho pulse sequences of the brain and surrounding structures were obtained without and with intravenous contrast. CONTRAST:  Gadavist 7 mL. COMPARISON:  None. FINDINGS: Brain: 13 x 14 x 13 mm metastasis, RIGHT lateral occipital lobe, mild surrounding edema. No midline shift. No other definite metastatic lesions. No acute stroke, hemorrhage hydrocephalus. Slight premature for age white matter disease. Other than the necrotic metastasis, no abnormal enhancement of the brain. Borderline pachymeningeal enhancement of a symmetric nature, could be related to the superficial metastasis. No leptomeningeal enhancement. Vascular: Normal flow voids. Skull and upper cervical spine: Diffusely abnormal marrow in the clivus and upper cervical region, concerning for metastatic disease. Sinuses/Orbits: No significant sinus disease.  Negative orbits. Other: None. IMPRESSION: Single 1.3 x  1.4 cm RIGHT occipital metastasis. Borderline pachymeningeal enhancement of symmetric nature could be related to the superficial metastasis or osseous disease. A call has been placed to the ordering provider. Electronically Signed   By: Staci Righter M.D.   On: 03/12/2018 13:28   Ct Abdomen Pelvis W Contrast  Result Date: 03/11/2018 CLINICAL DATA:  Weight loss in thrombocytopenia. EXAM: CT CHEST, ABDOMEN, AND PELVIS WITH CONTRAST TECHNIQUE: Multidetector CT imaging of the chest, abdomen and pelvis was performed following the standard protocol during bolus administration of intravenous contrast. CONTRAST:  157mL OMNIPAQUE IOHEXOL 300 MG/ML SOLN, 46mL OMNIPAQUE IOHEXOL 300 MG/ML SOLN COMPARISON:  None. FINDINGS: CT CHEST FINDINGS Cardiovascular: The heart size is normal. No substantial pericardial effusion. Atherosclerotic calcification is noted in the wall of the thoracic aorta. Mediastinum/Nodes: 13 mm short axis precarinal lymph node mildly enlarged. 13 mm densely calcified subcarinal lymph node is associated with calcified nodal tissue in the right hilum. No left hilar lymphadenopathy. 9 mm short axis left axillary lymph node is associated with small additional left axillary and subpectoral nodes. No right hilar adenopathy. Lungs/Pleura: The central tracheobronchial airways are patent. 2.7 x 2.2 cm central right upper lobe nodule identified on 56/4. Numerous additional pulmonary nodules are identified bilaterally ranging in size from several mm up to 10 mm in the right upper lobe. These nodules have a slight upper lung predominance. Index 7 mm right lower lobe nodule identified on 64/4. No pleural effusion. Calcified granuloma noted posterior right costophrenic sulcus. Musculoskeletal: No worrisome lytic or sclerotic osseous abnormality. Masslike asymmetry of left breast tissue identified in the medial inferior quadrant of the left breast (40/2) measuring 2.6 x 2.3 cm. Coronal imaging suggests that this lesion has spiculated margins (22/5). CT ABDOMEN PELVIS FINDINGS Hepatobiliary: Multiple (approximately 10) ill-defined liver lesions are concerning for metastases. Dominant lesions are a 3.6 cm lesion in the right liver (54/2) and a 3.8 cm lesion  identified lateral segment left liver (58/2). Other lesions range in size from 8 mm up to 2.6 cm. There is no evidence for gallstones, gallbladder wall thickening, or pericholecystic fluid. No intrahepatic or extrahepatic biliary dilation. Pancreas: No focal mass lesion. No dilatation of the main duct. No intraparenchymal cyst. No peripancreatic edema. Spleen: Calcified granulomata. Adrenals/Urinary Tract: No adrenal nodule or mass. Kidneys unremarkable. Central sinus cysts noted in the left kidney. No evidence for hydroureter. The urinary bladder appears normal for the degree of distention. Stomach/Bowel: Stomach is unremarkable. No gastric wall thickening. No evidence of outlet obstruction. Duodenum is normally positioned as is the ligament of Treitz. No small bowel wall thickening. No small bowel dilatation. The terminal ileum is normal. The appendix is not visualized, but there is no edema or inflammation in the region of the cecum. No gross colonic mass. No colonic wall thickening. Vascular/Lymphatic: There is abdominal aortic atherosclerosis without aneurysm. There is no gastrohepatic or hepatoduodenal ligament lymphadenopathy. No intraperitoneal or retroperitoneal lymphadenopathy. No pelvic sidewall lymphadenopathy. Reproductive: The uterus is unremarkable.  There is no adnexal mass. Other: No intraperitoneal free fluid. Musculoskeletal: No worrisome lytic or sclerotic osseous abnormality. IMPRESSION: 1. 2.6 x 2.3 cm spiculated mass identified inferomedial quadrant of the left breast. Lesion appears to retract the overlying skin. Mammographic correlation recommended. 2. Small lymph nodes in the left axillar ill-defined and suspicious for metastatic disease. 3. Multiple bilateral pulmonary nodules measuring up to 2.7 cm diameter. These probably represent metastatic disease. Given the dominant size of the central right upper lobe lesion, synchronous lung primary can not  be completely excluded. 4. Multiple  ill-defined liver lesions compatible with metastatic disease. Dominant liver metastases measure up to almost 4 cm. 5.  Aortic Atherosclerois (ICD10-170.0) Electronically Signed   By: Misty Stanley M.D.   On: 03/11/2018 18:30   US Biopsy (liver)  Result Date: 03/12/2018 INDICATION: Multiple liver masses, lung lesions and left breast mass. The patient presents for biopsy of a liver lesion. EXAM: ULTRASOUND GUIDED CORE BIOPSY OF LIVER MEDICATIONS: None. ANESTHESIA/SEDATION: Fentanyl 100 mcg IV; Versed 2.0 mg IV Moderate Sedation Time:  20 minutes. The patient was continuously monitored during the procedure by the interventional radiology nurse under my direct supervision. PROCEDURE: The procedure, risks, benefits, and alternatives were explained to the patient. Questions regarding the procedure were encouraged and answered. The patient understands and consents to the procedure. A time-out was performed prior to initiating the procedure. Initial ultrasound was performed to localize liver lesions. The epigastric region was prepped with chlorhexidine in a sterile fashion, and a sterile drape was applied covering the operative field. A sterile gown and sterile gloves were used for the procedure. Local anesthesia was provided with 1% Lidocaine. Under ultrasound guidance, a 17 gauge trocar needle was advanced into a lesion within the left lobe of the liver. After confirming needle tip position, 3 coaxial 18 gauge core biopsy samples were obtained. Material was submitted in formalin. After the procedure Gel-Foam pledgets were advanced through the outer needle as the needle was retracted. COMPLICATIONS: None immediate. 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 The largest and most accessible by ultrasound was within the lateral segment of the left lobe and measures just over 3.5 cm in estimated maximal diameter. Two intact solid core biopsy samples were obtained. The third throw of the  biopsy device did not yield much tissue. There were no immediate bleeding complications. IMPRESSION: Ultrasound-guided core biopsy performed of a mass within the lateral segment of the left lobe of the liver. Electronically Signed   By: Aletta Edouard M.D.   On: 03/12/2018 16:43

## 2018-03-13 NOTE — Discharge Summary (Signed)
Physician Discharge Summary  Stacy Shelton FBP:[id number] DOB: 1960-05-18 DOA: 03/10/2018  PCP: Robyne Peers, MD  Admit date: 03/10/2018 Discharge date: 03/13/2018  Admitted From: Home Disposition:  Home  Recommendations for Outpatient Follow-up:  1. Follow up with PCP in 1-2 weeks 2. Follow up with Oncology as scheduled  Discharge Condition:Stable CODE STATUS:Full Diet recommendation: Diabetic   Brief/Interim Summary: 58 y.o.femalewith medical history significant forasthma, now presenting to emergency department for evaluation of palpitations and abnormal outpatient blood work.Patient reports being in her usual state of health until approximately 2 weeks ago when she noted the insidious development of exertional dyspnea, fatigue, and palpitations. She had suffered a viral URI in November with fevers and cough, but that seemed to have resolved completely when she noted becoming fatigued with only minimal exertion approximately 2 weeks ago.She describes having to lay down and rest after very light housework. She also developed palpitations around that time that has been intermittent. She denies any cough or wheezing recently, but becomes short of breath with minimal activity. All of the symptoms have been slowly worsening over the past couple weeks, she was evaluated in an outpatient clinic today, was sent for some basic blood work, started on Toprol for suspected hyperthyroidism, and was instructed to go to the ED when the blood work returned abnormal. She was told that her glucose was in the 600s. She denies any history of diabetes. She has a couple alcoholic beverages weekly, but denies any history of excessive use. She took 1 dose of Toprol prior to coming in, was recently prescribed Flexeril for a low back strain but it made her too sleepy and she has not been taking it. She denies any other new medications. She denies any fevers or rash. She denies melena, hematochezia, or  hematemesis.  ED Course:Upon arrival to the ED, patient is found to be afebrile, saturating well on room air, tachycardic in the 110s, and with stable blood pressure. Chemistry panel is notable for glucose of 433 with bicarbonate of 21 and normal anion gap. CBC features a hemoglobin of 6.4 and platelets of 74,000. Urinalysis notable for glucosuria and ketonuria. Fecal occult blood testing is negative. Patient was given a liter of normal saline and 2 units of packed red blood cells were ordered. Hospitalists are asked to admit for symptomatic anemia and suspected new onset diabetes.  Discharge Diagnoses:  Principal Problem:   Symptomatic anemia Active Problems:   Thrombocytopenia ([place])   Uncontrolled diabetes mellitus with hyperglycemia ([facility])   Moderate persistent asthma   1.Symptomatic anemia; thrombocytopenia, likely secondary to malignancy -Presents with insidious development of fatigue, DOE, and palpitations -Found to have Hgb of 6.4 and platelets of 74,000 -FOBT is negative; renal function preserved; no schistocytes reported on smear; bilirubin normal -given 2 units PRBC transfusion -CBC profile remained stable  2.diabetes, new diagnosis -Serum glucose is 433 in ED -She was treated with 1 liter saline in ED -A1c noted to be 12.9. Pt continued on insulin. Would continue to titrate insulin accordingly  3.Asthma -No cough or wheezing on admission -Continue Flovent, Singulair, and as-needed albuterol -On minimal O2 support currently  4. Metastatic breast cancer with disease to lungs, liver, bone, brain -MRI brain reviewed. 1.5cm occipital lesion noted worrisome for malignancy. Have discussed case with Neuro-Oncologist who plans to discuss pt during tumor board next week, involving Rad Onc, Neurosurgery -Discussed case with Oncology. Pt now s/p liver biopsy 2/12, results pending   Discharge Instructions   Allergies as of 03/13/2018  Reactions    Nsaids Anaphylaxis      Medication List    STOP taking these medications   cyclobenzaprine 10 MG tablet Commonly known as:  FLEXERIL     TAKE these medications   albuterol 108 (90 Base) MCG/ACT inhaler Commonly known as:  PROAIR HFA Inhale 2 puffs into the lungs every 6 (six) hours as needed for wheezing or shortness of breath.   blood glucose meter kit and supplies Kit Dispense based on patient and insurance preference. Use up to four times daily as directed. (FOR ICD-9 250.00, 250.01).   esomeprazole 40 MG capsule Commonly known as:  NEXIUM Take 1 capsule (40 mg total) by mouth 2 (two) times daily before a meal.   fluticasone 44 MCG/ACT inhaler Commonly known as:  FLOVENT HFA Inhale 2 puffs into the lungs 2 (two) times daily.   insulin aspart 100 UNIT/ML FlexPen Commonly known as:  NOVOLOG Inject 8 Units into the skin 3 (three) times daily with meals.   Insulin Glargine 100 UNIT/ML Solostar Pen Commonly known as:  LANTUS Inject 20 Units into the skin daily.   Insulin Pen Needle 31G X 5 MM Misc 1 Device by Does not apply route QID. For use with insulin pens   metoprolol succinate 50 MG 24 hr tablet Commonly known as:  TOPROL-XL Take 50 mg by mouth every evening.   montelukast 10 MG tablet Commonly known as:  SINGULAIR TAKE 1 TABLET BY MOUTH EVERYDAY AT BEDTIME What changed:  See the new instructions.   multivitamin with minerals Tabs tablet Take 1 tablet by mouth daily.   NASACORT ALLERGY 24HR 55 MCG/ACT Aero nasal inhaler Generic drug:  triamcinolone Place 2 sprays into the nose daily.   ranitidine 300 MG tablet Commonly known as:  ZANTAC Take 1 tablet (300 mg total) by mouth at bedtime. What changed:    when to take this  reasons to take this      Follow-up Information    Robyne Peers, MD. Schedule an appointment as soon as possible for a visit in 1 week(s).   Specialty:  Family Medicine Contact information: [address] [place] [address] [phone]          Allergies  Allergen Reactions  . Nsaids Anaphylaxis    Consultations:  Oncology  IR  Procedures/Studies: Ct Chest W Contrast  Result Date: 03/11/2018 CLINICAL DATA:  Weight loss in thrombocytopenia. EXAM: CT CHEST, ABDOMEN, AND PELVIS WITH CONTRAST TECHNIQUE: Multidetector CT imaging of the chest, abdomen and pelvis was performed following the standard protocol during bolus administration of intravenous contrast. CONTRAST:  148m OMNIPAQUE IOHEXOL 300 MG/ML SOLN, 350mOMNIPAQUE IOHEXOL 300 MG/ML SOLN COMPARISON:  None. FINDINGS: CT CHEST FINDINGS Cardiovascular: The heart size is normal. No substantial pericardial effusion. Atherosclerotic calcification is noted in the wall of the thoracic aorta. Mediastinum/Nodes: 13 mm short axis precarinal lymph node mildly enlarged. 13 mm densely calcified subcarinal lymph node is associated with calcified nodal tissue in the right hilum. No left hilar lymphadenopathy. 9 mm short axis left axillary lymph node is associated with small additional left axillary and subpectoral nodes. No right hilar adenopathy. Lungs/Pleura: The central tracheobronchial airways are patent. 2.7 x 2.2 cm central right upper lobe nodule identified on 56/4. Numerous additional pulmonary nodules are identified bilaterally ranging in size from several mm up to 10 mm in the right upper lobe. These nodules have a slight upper lung predominance. Index 7 mm right lower lobe nodule identified on  64/4. No pleural effusion. Calcified granuloma noted posterior right costophrenic sulcus. Musculoskeletal: No worrisome lytic or sclerotic osseous abnormality. Masslike asymmetry of left breast tissue identified in the medial inferior quadrant of the left breast (40/2) measuring 2.6 x 2.3 cm. Coronal imaging suggests that this lesion has spiculated margins (22/5). CT ABDOMEN PELVIS FINDINGS Hepatobiliary: Multiple (approximately 10) ill-defined liver  lesions are concerning for metastases. Dominant lesions are a 3.6 cm lesion in the right liver (54/2) and a 3.8 cm lesion identified lateral segment left liver (58/2). Other lesions range in size from 8 mm up to 2.6 cm. There is no evidence for gallstones, gallbladder wall thickening, or pericholecystic fluid. No intrahepatic or extrahepatic biliary dilation. Pancreas: No focal mass lesion. No dilatation of the main duct. No intraparenchymal cyst. No peripancreatic edema. Spleen: Calcified granulomata. Adrenals/Urinary Tract: No adrenal nodule or mass. Kidneys unremarkable. Central sinus cysts noted in the left kidney. No evidence for hydroureter. The urinary bladder appears normal for the degree of distention. Stomach/Bowel: Stomach is unremarkable. No gastric wall thickening. No evidence of outlet obstruction. Duodenum is normally positioned as is the ligament of Treitz. No small bowel wall thickening. No small bowel dilatation. The terminal ileum is normal. The appendix is not visualized, but there is no edema or inflammation in the region of the cecum. No gross colonic mass. No colonic wall thickening. Vascular/Lymphatic: There is abdominal aortic atherosclerosis without aneurysm. There is no gastrohepatic or hepatoduodenal ligament lymphadenopathy. No intraperitoneal or retroperitoneal lymphadenopathy. No pelvic sidewall lymphadenopathy. Reproductive: The uterus is unremarkable.  There is no adnexal mass. Other: No intraperitoneal free fluid. Musculoskeletal: No worrisome lytic or sclerotic osseous abnormality. IMPRESSION: 1. 2.6 x 2.3 cm spiculated mass identified inferomedial quadrant of the left breast. Lesion appears to retract the overlying skin. Mammographic correlation recommended. 2. Small lymph nodes in the left axillar ill-defined and suspicious for metastatic disease. 3. Multiple bilateral pulmonary nodules measuring up to 2.7 cm diameter. These probably represent metastatic disease. Given the  dominant size of the central right upper lobe lesion, synchronous lung primary can not be completely excluded. 4. Multiple ill-defined liver lesions compatible with metastatic disease. Dominant liver metastases measure up to almost 4 cm. 5.  Aortic Atherosclerois (ICD10-170.0) Electronically Signed   By: Misty Stanley M.D.   On: 03/11/2018 18:30   Mr Jeri Cos ZG Contrast  Result Date: 03/12/2018 CLINICAL DATA:  Metastatic breast cancer.  Gait disturbance. EXAM: MRI HEAD WITHOUT AND WITH CONTRAST TECHNIQUE: Multiplanar, multiecho pulse sequences of the brain and surrounding structures were obtained without and with intravenous contrast. CONTRAST:  Gadavist 7 mL. COMPARISON:  None. FINDINGS: Brain: 13 x 14 x 13 mm metastasis, RIGHT lateral occipital lobe, mild surrounding edema. No midline shift. No other definite metastatic lesions. No acute stroke, hemorrhage hydrocephalus. Slight premature for age white matter disease. Other than the necrotic metastasis, no abnormal enhancement of the brain. Borderline pachymeningeal enhancement of a symmetric nature, could be related to the superficial metastasis. No leptomeningeal enhancement. Vascular: Normal flow voids. Skull and upper cervical spine: Diffusely abnormal marrow in the clivus and upper cervical region, concerning for metastatic disease. Sinuses/Orbits: No significant sinus disease.  Negative orbits. Other: None. IMPRESSION: Single 1.3 x 1.4 cm RIGHT occipital metastasis. Borderline pachymeningeal enhancement of symmetric nature could be related to the superficial metastasis or osseous disease. A call has been placed to the ordering provider. Electronically Signed   By: Staci Righter M.D.   On: 03/12/2018 13:28   Ct Abdomen Pelvis W Contrast  Result Date: 03/11/2018 CLINICAL DATA:  Weight loss in thrombocytopenia. EXAM: CT CHEST, ABDOMEN, AND PELVIS WITH CONTRAST TECHNIQUE: Multidetector CT imaging of the chest, abdomen and pelvis was performed following  the standard protocol during bolus administration of intravenous contrast. CONTRAST:  178m OMNIPAQUE IOHEXOL 300 MG/ML SOLN, 368mOMNIPAQUE IOHEXOL 300 MG/ML SOLN COMPARISON:  None. FINDINGS: CT CHEST FINDINGS Cardiovascular: The heart size is normal. No substantial pericardial effusion. Atherosclerotic calcification is noted in the wall of the thoracic aorta. Mediastinum/Nodes: 13 mm short axis precarinal lymph node mildly enlarged. 13 mm densely calcified subcarinal lymph node is associated with calcified nodal tissue in the right hilum. No left hilar lymphadenopathy. 9 mm short axis left axillary lymph node is associated with small additional left axillary and subpectoral nodes. No right hilar adenopathy. Lungs/Pleura: The central tracheobronchial airways are patent. 2.7 x 2.2 cm central right upper lobe nodule identified on 56/4. Numerous additional pulmonary nodules are identified bilaterally ranging in size from several mm up to 10 mm in the right upper lobe. These nodules have a slight upper lung predominance. Index 7 mm right lower lobe nodule identified on 64/4. No pleural effusion. Calcified granuloma noted posterior right costophrenic sulcus. Musculoskeletal: No worrisome lytic or sclerotic osseous abnormality. Masslike asymmetry of left breast tissue identified in the medial inferior quadrant of the left breast (40/2) measuring 2.6 x 2.3 cm. Coronal imaging suggests that this lesion has spiculated margins (22/5). CT ABDOMEN PELVIS FINDINGS Hepatobiliary: Multiple (approximately 10) ill-defined liver lesions are concerning for metastases. Dominant lesions are a 3.6 cm lesion in the right liver (54/2) and a 3.8 cm lesion identified lateral segment left liver (58/2). Other lesions range in size from 8 mm up to 2.6 cm. There is no evidence for gallstones, gallbladder wall thickening, or pericholecystic fluid. No intrahepatic or extrahepatic biliary dilation. Pancreas: No focal mass lesion. No dilatation of  the main duct. No intraparenchymal cyst. No peripancreatic edema. Spleen: Calcified granulomata. Adrenals/Urinary Tract: No adrenal nodule or mass. Kidneys unremarkable. Central sinus cysts noted in the left kidney. No evidence for hydroureter. The urinary bladder appears normal for the degree of distention. Stomach/Bowel: Stomach is unremarkable. No gastric wall thickening. No evidence of outlet obstruction. Duodenum is normally positioned as is the ligament of Treitz. No small bowel wall thickening. No small bowel dilatation. The terminal ileum is normal. The appendix is not visualized, but there is no edema or inflammation in the region of the cecum. No gross colonic mass. No colonic wall thickening. Vascular/Lymphatic: There is abdominal aortic atherosclerosis without aneurysm. There is no gastrohepatic or hepatoduodenal ligament lymphadenopathy. No intraperitoneal or retroperitoneal lymphadenopathy. No pelvic sidewall lymphadenopathy. Reproductive: The uterus is unremarkable.  There is no adnexal mass. Other: No intraperitoneal free fluid. Musculoskeletal: No worrisome lytic or sclerotic osseous abnormality. IMPRESSION: 1. 2.6 x 2.3 cm spiculated mass identified inferomedial quadrant of the left breast. Lesion appears to retract the overlying skin. Mammographic correlation recommended. 2. Small lymph nodes in the left axillar ill-defined and suspicious for metastatic disease. 3. Multiple bilateral pulmonary nodules measuring up to 2.7 cm diameter. These probably represent metastatic disease. Given the dominant size of the central right upper lobe lesion, synchronous lung primary can not be completely excluded. 4. Multiple ill-defined liver lesions compatible with metastatic disease. Dominant liver metastases measure up to almost 4 cm. 5.  Aortic Atherosclerois (ICD10-170.0) Electronically Signed   By: ErMisty Stanley.D.   On: 03/11/2018 18:30   UsKoreaiopsy (liver)  Result Date: 03/12/2018 INDICATION: Multiple  liver masses, lung lesions and left breast mass. The patient presents for biopsy of a liver lesion. EXAM: ULTRASOUND GUIDED CORE BIOPSY OF LIVER MEDICATIONS: None. ANESTHESIA/SEDATION: Fentanyl 100 mcg IV; Versed 2.0 mg IV Moderate Sedation Time:  20 minutes. The patient was continuously monitored during the procedure by the interventional radiology nurse under my direct supervision. PROCEDURE: The procedure, risks, benefits, and alternatives were explained to the patient. Questions regarding the procedure were encouraged and answered. The patient understands and consents to the procedure. A time-out was performed prior to initiating the procedure. Initial ultrasound was performed to localize liver lesions. The epigastric region was prepped with chlorhexidine in a sterile fashion, and a sterile drape was applied covering the operative field. A sterile gown and sterile gloves were used for the procedure. Local anesthesia was provided with 1% Lidocaine. Under ultrasound guidance, a 17 gauge trocar needle was advanced into a lesion within the left lobe of the liver. After confirming needle tip position, 3 coaxial 18 gauge core biopsy samples were obtained. Material was submitted in formalin. After the procedure Gel-Foam pledgets were advanced through the outer needle as the needle was retracted. COMPLICATIONS: None immediate. 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 The largest and most accessible by ultrasound was within the lateral segment of the left lobe and measures just over 3.5 cm in estimated maximal diameter. Two intact solid core biopsy samples were obtained. The third throw of the biopsy device did not yield much tissue. There were no immediate bleeding complications. IMPRESSION: Ultrasound-guided core biopsy performed of a mass within the lateral segment of the left lobe of the liver. Electronically Signed   By: Aletta Edouard M.D.   On: 03/12/2018 16:43      Subjective: Eager to go home  Discharge Exam: Vitals:   03/13/18 0607 03/13/18 0856  BP: 134/70 126/65  Pulse: (!) 102 100  Resp: 20 19  Temp: 100.3 F (37.9 C) 98.8 F (37.1 C)  SpO2: 95% 96%   Vitals:   03/12/18 2100 03/12/18 2212 03/13/18 0607 03/13/18 0856  BP: (!) 141/69  134/70 126/65  Pulse: 97  (!) 102 100  Resp: _0 Temp: 98.7 F (37.1 C)  100.3 F (37.9 C) 98.8 F (37.1 C)  TempSrc:   Oral Oral  SpO2: 95% 100% 95% 96%  Weight:      Height:        General: Pt is alert, awake, not in acute distress Cardiovascular: RRR, S1/S2 +, no rubs, no gallops Respiratory: CTA bilaterally, no wheezing, no rhonchi Abdominal: Soft, NT, ND, bowel sounds + Extremities: no edema, no cyanosis   The results of significant diagnostics from this hospitalization (including imaging, microbiology, ancillary and laboratory) are listed below for reference.     Microbiology: No results found for this or any previous visit (from the past 240 hour(s)).   Labs: BNP (last 3 results) No results for input(s): BNP in the last 8760 hours. Basic Metabolic Panel: Recent Labs  Lab 03/10/18 2207 03/11/18 0013  NA 130* 128*  K 3.4* 3.4*  CL 94* 92*  CO2 21* 20*  GLUCOSE 433* 425*  BUN 15 14  CREATININE 0.68 0.67  CALCIUM 9.1 9.0   Liver Function Tests: Recent Labs  Lab 03/10/18 2207  AST 14*  ALT 13  ALKPHOS 78  BILITOT 1.2  PROT 7.1  ALBUMIN 3.7   No results for input(s): LIPASE, AMYLASE in the last 168 hours. No results for input(s):  AMMONIA in the last 168 hours. CBC: Recent Labs  Lab 03/10/18 2207 03/11/18 1235 03/12/18 0941  WBC 8.5 6.7 6.0  NEUTROABS 3.3 2.6 3.9  HGB 6.4* 9.1* 9.6*  HCT 20.5* 28.7* 29.8*  MCV 86.5 86.4 88.2  PLT 74* 60* 62*   Cardiac Enzymes: No results for input(s): CKTOTAL, CKMB, CKMBINDEX, TROPONINI in the last 168 hours. BNP: Invalid input(s): POCBNP CBG: Recent Labs  Lab 03/12/18 0736 03/12/18 1130 03/12/18 1657  03/12/18 2111 03/13/18 0758  GLUCAP 287* 396* 188* 209* 235*   D-Dimer No results for input(s): DDIMER in the last 72 hours. Hgb A1c Recent Labs    03/10/18 2350  HGBA1C 12.9*   Lipid Profile No results for input(s): CHOL, HDL, LDLCALC, TRIG, CHOLHDL, LDLDIRECT in the last 72 hours. Thyroid function studies Recent Labs    03/11/18 0100  TSH 3.103   Anemia work up Recent Labs    03/10/18 2350  VITAMINB12 2,002*  FOLATE 14.1  FERRITIN 913*  TIBC 233*  IRON 83  RETICCTPCT 2.8   Urinalysis    Component Value Date/Time   COLORURINE YELLOW 03/10/2018 2207   APPEARANCEUR CLEAR 03/10/2018 2207   LABSPEC 1.028 03/10/2018 2207   PHURINE 5.0 03/10/2018 2207   GLUCOSEU >=500 (A) 03/10/2018 2207   HGBUR NEGATIVE 03/10/2018 2207   BILIRUBINUR NEGATIVE 03/10/2018 2207   KETONESUR 80 (A) 03/10/2018 2207   PROTEINUR NEGATIVE 03/10/2018 2207   NITRITE NEGATIVE 03/10/2018 2207   LEUKOCYTESUR NEGATIVE 03/10/2018 2207   Sepsis Labs Invalid input(s): PROCALCITONIN,  WBC,  LACTICIDVEN Microbiology No results found for this or any previous visit (from the past 240 hour(s)).  Time spent: 30 min  SIGNED:   Marylu Lund, MD  [facility] 03/13/2018, 12:04 PM  If 7PM-7AM, please contact night-coverage

## 2018-03-13 NOTE — Progress Notes (Signed)
.  5mg  of ativan wasted in the stericycle with Carolyn Stare, RN.

## 2018-03-13 NOTE — Consult Note (Signed)
Radiation Oncology         ([phone]) [phone] ________________________________  Name: Stacy Shelton        MRN: [id number]  Date of Service: 03/13/2018 DOB: [birth year]  ID:[email or web address], [name], MD  No ref. provider found     REFERRING PHYSICIAN: No ref. provider found   DIAGNOSIS: The primary encounter diagnosis was Symptomatic anemia. Diagnoses of Thrombocytopenia ([place]), New onset type 2 diabetes mellitus ([place]), Pancytopenia, acquired ([place]), and Metastatic breast cancer ([place]) were also pertinent to this visit.   HISTORY OF PRESENT ILLNESS: Stacy Shelton is a 58 y.o. female seen at the request of Dr. Alvy Bimler for a presumed stage IV breast cancer.  The patient had been in her usual state of health however been had been experiencing increasing coughing, weight loss, and thrombocytopenia which prompted further evaluation in the ED on 03/10/2018. CT imaging in the hospital setting reveals concerns for metastatic pulmonary disease as well as additional imaging that is been collected revealing liver disease as well.  The patient has undergone biopsy of her liver lesion yesterday and pathology is pending at this time.  For more complete staging she did undergo an MRI scan of the brain on 03/12/2018 this revealed a solitary 13 x 14 x 13 mm right lateral occipital lobe metastasis with mild surrounding edema.  No midline shift or other metastatic lesions were identified.  Given the findings, we were asked to see the patient to consider stereotactic radiosurgery to the site.  The patient is getting ready to discharge home today, in an effort to try and speed the process of her assessment along, we came to see her to talk about these options.    PREVIOUS RADIATION THERAPY: No   PAST MEDICAL HISTORY: History reviewed. No pertinent past medical history.     PAST SURGICAL HISTORY: Past Surgical History:  Procedure Laterality Date  . KIDNEY STONE SURGERY    . SINOSCOPY    . WISDOM TOOTH EXTRACTION       FAMILY  HISTORY:  Family History  Problem Relation Age of Onset  . Allergic rhinitis Neg Hx   . Angioedema Neg Hx   . Asthma Neg Hx   . Atopy Neg Hx   . Eczema Neg Hx   . Immunodeficiency Neg Hx   . Urticaria Neg Hx      SOCIAL HISTORY:  reports that she has never smoked. She has never used smokeless tobacco. She reports current alcohol use. She reports that she does not use drugs.   ALLERGIES: Nsaids   MEDICATIONS:  Current Facility-Administered Medications  Medication Dose Route Frequency Provider Last Rate Last Dose  . 0.9 %  sodium chloride infusion  250 mL Intravenous PRN Opyd, Ilene Qua, MD      . acetaminophen (TYLENOL) tablet 650 mg  650 mg Oral Q6H PRN Opyd, Ilene Qua, MD   650 mg at 03/13/18 5361   Or  . acetaminophen (TYLENOL) suppository 650 mg  650 mg Rectal Q6H PRN Opyd, Ilene Qua, MD      . albuterol (PROVENTIL) (2.5 MG/3ML) 0.083% nebulizer solution 2.5 mg  2.5 mg Nebulization Q6H PRN Opyd, Ilene Qua, MD   2.5 mg at 03/11/18 1633  . budesonide (PULMICORT) nebulizer solution 0.25 mg  0.25 mg Nebulization BID Opyd, Ilene Qua, MD   0.25 mg at 03/12/18 2212  . guaiFENesin-dextromethorphan (ROBITUSSIN DM) 100-10 MG/5ML syrup 5 mL  5 mL Oral Q4H PRN Opyd, Ilene Qua, MD   5 mL at 03/11/18 0645  .  insulin aspart (novoLOG) injection 0-5 Units  0-5 Units Subcutaneous QHS Vianne Bulls, MD   2 Units at 03/12/18 2120  . insulin aspart (novoLOG) injection 0-9 Units  0-9 Units Subcutaneous TID WC Opyd, Ilene Qua, MD   7 Units at 03/13/18 1242  . insulin aspart (novoLOG) injection 5 Units  5 Units Subcutaneous TID WC Donne Hazel, MD   5 Units at 03/13/18 0915  . insulin glargine (LANTUS) injection 15 Units  15 Units Subcutaneous Daily Donne Hazel, MD   15 Units at 03/13/18 0915  . iohexol (OMNIPAQUE) 300 MG/ML solution 15 mL  15 mL Oral Once PRN Irene Pap N, DO   30 mL at 03/11/18 1500  . LORazepam (ATIVAN) tablet 0.5 mg  0.5 mg Oral Once Alvy Bimler, Ni, MD      . metoprolol  succinate (TOPROL-XL) 24 hr tablet 50 mg  50 mg Oral QPM Donne Hazel, MD   50 mg at 03/12/18 1713  . montelukast (SINGULAIR) tablet 10 mg  10 mg Oral QHS Opyd, Ilene Qua, MD   10 mg at 03/12/18 2104  . ondansetron (ZOFRAN) tablet 4 mg  4 mg Oral Q6H PRN Opyd, Ilene Qua, MD       Or  . ondansetron (ZOFRAN) injection 4 mg  4 mg Intravenous Q6H PRN Opyd, Ilene Qua, MD      . pantoprazole (PROTONIX) EC tablet 40 mg  40 mg Oral BID Opyd, Ilene Qua, MD   40 mg at 03/13/18 0914  . senna-docusate (Senokot-S) tablet 1 tablet  1 tablet Oral QHS PRN Opyd, Ilene Qua, MD         REVIEW OF SYSTEMS: On review of systems, the patient reports that she is concerned about the findings but reports no headaches, visual disturbances, nausea, or uncontrolled movements. She denies any chest pain, shortness of breath, cough, fevers, chills, night sweats. She denies any bowel or bladder disturbances, and denies abdominal pain, nausea or vomiting. She denies any new musculoskeletal or joint aches or pains. A complete review of systems is obtained and is otherwise negative.     PHYSICAL EXAM:  Wt Readings from Last 3 Encounters:  03/11/18 155 lb 6.8 oz (70.5 kg)  01/01/18 167 lb 6.4 oz (75.9 kg)  05/16/17 172 lb 9.6 oz (78.3 kg)   Temp Readings from Last 3 Encounters:  03/13/18 98.8 F (37.1 C) (Oral)  01/01/18 98.4 F (36.9 C) (Oral)  04/12/16 98.1 F (36.7 C) (Oral)   BP Readings from Last 3 Encounters:  03/13/18 126/65  01/01/18 124/62  05/16/17 118/72   Pulse Readings from Last 3 Encounters:  03/13/18 100  01/01/18 94  05/16/17 98   Pain Assessment Pain Score: 3 /10  In general this is a well appearing Caucasian female in no acute distress.  She is alert and oriented x4 and appropriate throughout the examination. HEENT reveals that the patient is normocephalic, atraumatic. EOMs are intact. Cardiopulmonary assessment is negative for acute distress and she exhibits normal effort.  No focal  neurologic deficits are noted grossly during our discussion.    ECOG = 1  0 - Asymptomatic (Fully active, able to carry on all predisease activities without restriction)  1 - Symptomatic but completely ambulatory (Restricted in physically strenuous activity but ambulatory and able to carry out work of a light or sedentary nature. For example, light housework, office work)  2 - Symptomatic, <50% in bed during the day (Ambulatory and capable of all self care but  unable to carry out any work activities. Up and about more than 50% of waking hours)  3 - Symptomatic, >50% in bed, but not bedbound (Capable of only limited self-care, confined to bed or chair 50% or more of waking hours)  4 - Bedbound (Completely disabled. Cannot carry on any self-care. Totally confined to bed or chair)  5 - Death   Eustace Pen MM, Creech RH, Tormey DC, et al. [id number]). "Toxicity and response criteria of the [facility]". [place] Oncol. 5 (6): 649-55    LABORATORY DATA:  Lab Results  Component Value Date   WBC 6.0 03/12/2018   HGB 9.6 (L) 03/12/2018   HCT 29.8 (L) 03/12/2018   MCV 88.2 03/12/2018   PLT 62 (L) 03/12/2018   Lab Results  Component Value Date   NA 128 (L) 03/11/2018   K 3.4 (L) 03/11/2018   CL 92 (L) 03/11/2018   CO2 20 (L) 03/11/2018   Lab Results  Component Value Date   ALT 13 03/10/2018   AST 14 (L) 03/10/2018   ALKPHOS 78 03/10/2018   BILITOT 1.2 03/10/2018      RADIOGRAPHY: Ct Chest W Contrast  Result Date: 03/11/2018 CLINICAL DATA:  Weight loss in thrombocytopenia. EXAM: CT CHEST, ABDOMEN, AND PELVIS WITH CONTRAST TECHNIQUE: Multidetector CT imaging of the chest, abdomen and pelvis was performed following the standard protocol during bolus administration of intravenous contrast. CONTRAST:  125mL OMNIPAQUE IOHEXOL 300 MG/ML SOLN, 67mL OMNIPAQUE IOHEXOL 300 MG/ML SOLN COMPARISON:  None. FINDINGS: CT CHEST FINDINGS Cardiovascular: The heart size is normal. No  substantial pericardial effusion. Atherosclerotic calcification is noted in the wall of the thoracic aorta. Mediastinum/Nodes: 13 mm short axis precarinal lymph node mildly enlarged. 13 mm densely calcified subcarinal lymph node is associated with calcified nodal tissue in the right hilum. No left hilar lymphadenopathy. 9 mm short axis left axillary lymph node is associated with small additional left axillary and subpectoral nodes. No right hilar adenopathy. Lungs/Pleura: The central tracheobronchial airways are patent. 2.7 x 2.2 cm central right upper lobe nodule identified on 56/4. Numerous additional pulmonary nodules are identified bilaterally ranging in size from several mm up to 10 mm in the right upper lobe. These nodules have a slight upper lung predominance. Index 7 mm right lower lobe nodule identified on 64/4. No pleural effusion. Calcified granuloma noted posterior right costophrenic sulcus. Musculoskeletal: No worrisome lytic or sclerotic osseous abnormality. Masslike asymmetry of left breast tissue identified in the medial inferior quadrant of the left breast (40/2) measuring 2.6 x 2.3 cm. Coronal imaging suggests that this lesion has spiculated margins (22/5). CT ABDOMEN PELVIS FINDINGS Hepatobiliary: Multiple (approximately 10) ill-defined liver lesions are concerning for metastases. Dominant lesions are a 3.6 cm lesion in the right liver (54/2) and a 3.8 cm lesion identified lateral segment left liver (58/2). Other lesions range in size from 8 mm up to 2.6 cm. There is no evidence for gallstones, gallbladder wall thickening, or pericholecystic fluid. No intrahepatic or extrahepatic biliary dilation. Pancreas: No focal mass lesion. No dilatation of the main duct. No intraparenchymal cyst. No peripancreatic edema. Spleen: Calcified granulomata. Adrenals/Urinary Tract: No adrenal nodule or mass. Kidneys unremarkable. Central sinus cysts noted in the left kidney. No evidence for hydroureter. The urinary  bladder appears normal for the degree of distention. Stomach/Bowel: Stomach is unremarkable. No gastric wall thickening. No evidence of outlet obstruction. Duodenum is normally positioned as is the ligament of Treitz. No small bowel wall thickening. No small bowel dilatation.  The terminal ileum is normal. The appendix is not visualized, but there is no edema or inflammation in the region of the cecum. No gross colonic mass. No colonic wall thickening. Vascular/Lymphatic: There is abdominal aortic atherosclerosis without aneurysm. There is no gastrohepatic or hepatoduodenal ligament lymphadenopathy. No intraperitoneal or retroperitoneal lymphadenopathy. No pelvic sidewall lymphadenopathy. Reproductive: The uterus is unremarkable.  There is no adnexal mass. Other: No intraperitoneal free fluid. Musculoskeletal: No worrisome lytic or sclerotic osseous abnormality. IMPRESSION: 1. 2.6 x 2.3 cm spiculated mass identified inferomedial quadrant of the left breast. Lesion appears to retract the overlying skin. Mammographic correlation recommended. 2. Small lymph nodes in the left axillar ill-defined and suspicious for metastatic disease. 3. Multiple bilateral pulmonary nodules measuring up to 2.7 cm diameter. These probably represent metastatic disease. Given the dominant size of the central right upper lobe lesion, synchronous lung primary can not be completely excluded. 4. Multiple ill-defined liver lesions compatible with metastatic disease. Dominant liver metastases measure up to almost 4 cm. 5.  Aortic Atherosclerois (ICD10-170.0) Electronically Signed   By: Misty Stanley M.D.   On: 03/11/2018 18:30   Mr Jeri Cos KC Contrast  Result Date: 03/12/2018 CLINICAL DATA:  Metastatic breast cancer.  Gait disturbance. EXAM: MRI HEAD WITHOUT AND WITH CONTRAST TECHNIQUE: Multiplanar, multiecho pulse sequences of the brain and surrounding structures were obtained without and with intravenous contrast. CONTRAST:  Gadavist 7 mL.  COMPARISON:  None. FINDINGS: Brain: 13 x 14 x 13 mm metastasis, RIGHT lateral occipital lobe, mild surrounding edema. No midline shift. No other definite metastatic lesions. No acute stroke, hemorrhage hydrocephalus. Slight premature for age white matter disease. Other than the necrotic metastasis, no abnormal enhancement of the brain. Borderline pachymeningeal enhancement of a symmetric nature, could be related to the superficial metastasis. No leptomeningeal enhancement. Vascular: Normal flow voids. Skull and upper cervical spine: Diffusely abnormal marrow in the clivus and upper cervical region, concerning for metastatic disease. Sinuses/Orbits: No significant sinus disease.  Negative orbits. Other: None. IMPRESSION: Single 1.3 x 1.4 cm RIGHT occipital metastasis. Borderline pachymeningeal enhancement of symmetric nature could be related to the superficial metastasis or osseous disease. A call has been placed to the ordering provider. Electronically Signed   By: Staci Righter M.D.   On: 03/12/2018 13:28   Ct Abdomen Pelvis W Contrast  Result Date: 03/11/2018 CLINICAL DATA:  Weight loss in thrombocytopenia. EXAM: CT CHEST, ABDOMEN, AND PELVIS WITH CONTRAST TECHNIQUE: Multidetector CT imaging of the chest, abdomen and pelvis was performed following the standard protocol during bolus administration of intravenous contrast. CONTRAST:  145mL OMNIPAQUE IOHEXOL 300 MG/ML SOLN, 70mL OMNIPAQUE IOHEXOL 300 MG/ML SOLN COMPARISON:  None. FINDINGS: CT CHEST FINDINGS Cardiovascular: The heart size is normal. No substantial pericardial effusion. Atherosclerotic calcification is noted in the wall of the thoracic aorta. Mediastinum/Nodes: 13 mm short axis precarinal lymph node mildly enlarged. 13 mm densely calcified subcarinal lymph node is associated with calcified nodal tissue in the right hilum. No left hilar lymphadenopathy. 9 mm short axis left axillary lymph node is associated with small additional left axillary and  subpectoral nodes. No right hilar adenopathy. Lungs/Pleura: The central tracheobronchial airways are patent. 2.7 x 2.2 cm central right upper lobe nodule identified on 56/4. Numerous additional pulmonary nodules are identified bilaterally ranging in size from several mm up to 10 mm in the right upper lobe. These nodules have a slight upper lung predominance. Index 7 mm right lower lobe nodule identified on 64/4. No pleural effusion. Calcified granuloma noted posterior  right costophrenic sulcus. Musculoskeletal: No worrisome lytic or sclerotic osseous abnormality. Masslike asymmetry of left breast tissue identified in the medial inferior quadrant of the left breast (40/2) measuring 2.6 x 2.3 cm. Coronal imaging suggests that this lesion has spiculated margins (22/5). CT ABDOMEN PELVIS FINDINGS Hepatobiliary: Multiple (approximately 10) ill-defined liver lesions are concerning for metastases. Dominant lesions are a 3.6 cm lesion in the right liver (54/2) and a 3.8 cm lesion identified lateral segment left liver (58/2). Other lesions range in size from 8 mm up to 2.6 cm. There is no evidence for gallstones, gallbladder wall thickening, or pericholecystic fluid. No intrahepatic or extrahepatic biliary dilation. Pancreas: No focal mass lesion. No dilatation of the main duct. No intraparenchymal cyst. No peripancreatic edema. Spleen: Calcified granulomata. Adrenals/Urinary Tract: No adrenal nodule or mass. Kidneys unremarkable. Central sinus cysts noted in the left kidney. No evidence for hydroureter. The urinary bladder appears normal for the degree of distention. Stomach/Bowel: Stomach is unremarkable. No gastric wall thickening. No evidence of outlet obstruction. Duodenum is normally positioned as is the ligament of Treitz. No small bowel wall thickening. No small bowel dilatation. The terminal ileum is normal. The appendix is not visualized, but there is no edema or inflammation in the region of the cecum. No gross  colonic mass. No colonic wall thickening. Vascular/Lymphatic: There is abdominal aortic atherosclerosis without aneurysm. There is no gastrohepatic or hepatoduodenal ligament lymphadenopathy. No intraperitoneal or retroperitoneal lymphadenopathy. No pelvic sidewall lymphadenopathy. Reproductive: The uterus is unremarkable.  There is no adnexal mass. Other: No intraperitoneal free fluid. Musculoskeletal: No worrisome lytic or sclerotic osseous abnormality. IMPRESSION: 1. 2.6 x 2.3 cm spiculated mass identified inferomedial quadrant of the left breast. Lesion appears to retract the overlying skin. Mammographic correlation recommended. 2. Small lymph nodes in the left axillar ill-defined and suspicious for metastatic disease. 3. Multiple bilateral pulmonary nodules measuring up to 2.7 cm diameter. These probably represent metastatic disease. Given the dominant size of the central right upper lobe lesion, synchronous lung primary can not be completely excluded. 4. Multiple ill-defined liver lesions compatible with metastatic disease. Dominant liver metastases measure up to almost 4 cm. 5.  Aortic Atherosclerois (ICD10-170.0) Electronically Signed   By: Misty Stanley M.D.   On: 03/11/2018 18:30   US Biopsy (liver)  Result Date: 03/12/2018 INDICATION: Multiple liver masses, lung lesions and left breast mass. The patient presents for biopsy of a liver lesion. EXAM: ULTRASOUND GUIDED CORE BIOPSY OF LIVER MEDICATIONS: None. ANESTHESIA/SEDATION: Fentanyl 100 mcg IV; Versed 2.0 mg IV Moderate Sedation Time:  20 minutes. The patient was continuously monitored during the procedure by the interventional radiology nurse under my direct supervision. PROCEDURE: The procedure, risks, benefits, and alternatives were explained to the patient. Questions regarding the procedure were encouraged and answered. The patient understands and consents to the procedure. A time-out was performed prior to initiating the procedure. Initial  ultrasound was performed to localize liver lesions. The epigastric region was prepped with chlorhexidine in a sterile fashion, and a sterile drape was applied covering the operative field. A sterile gown and sterile gloves were used for the procedure. Local anesthesia was provided with 1% Lidocaine. Under ultrasound guidance, a 17 gauge trocar needle was advanced into a lesion within the left lobe of the liver. After confirming needle tip position, 3 coaxial 18 gauge core biopsy samples were obtained. Material was submitted in formalin. After the procedure Gel-Foam pledgets were advanced through the outer needle as the needle was retracted. COMPLICATIONS: None immediate. FINDINGS: Multiple  hypoechoic mass lesions are seen in the liver parenchyma. The largest and most accessible by ultrasound was within the lateral segment of the left lobe and measures just over 3.5 cm in estimated maximal diameter. Two intact solid core biopsy samples were obtained. The third throw of the biopsy device did not yield much tissue. There were no immediate bleeding complications. IMPRESSION: Ultrasound-guided core biopsy performed of a mass within the lateral segment of the left lobe of the liver. Electronically Signed   By: Aletta Edouard M.D.   On: 03/12/2018 16:43       IMPRESSION/PLAN: 1. Probable stage IV breast cancer with solitary brain metastasis.  The patient is still undergoing formal work-up of her presumed diagnosis, though the clinical picture seems most consistent with a chest primary.  We discussed that based on her imaging studies, she appears to be a good candidate for stereotactic radiosurgery, we discussed in the presence of the navigator the delivery and logistics of this treatment as well as the need for 3T MRI, CT simulation, consultation with neurosurgery, and a single fraction of treatment at the appropriate time once these have all been completed.  We discussed the nature of this treatment including the  risks, benefits, short and long-term effects of therapy as well as the long-term schedule for surveillance.  We will follow-up with the results of her biopsy when available, and communicate the results of her 3T MRI scan as well.  We will be in touch with the patient as she discharges into the outpatient center to coordinate all of this.  At the end of the conversation the patient is interested in moving forward.  She will meet with Dr. Lisbeth Renshaw who is reviewed her case and made these recommendations as an outpatient as well. Written consent is obtained and placed in the chart, a copy was provided to the patient. 2. Claustrophobia.  We discussed the rationale to utilize Ativan prior to her MRI, CT simulation and radiation related appointments.  This was called into her pharmacy.  We discussed the side effect profile and the need for a driver for these appointments when she is taking medication.  She is in agreement with this plan.   In a visit lasting 70 minutes, greater than 50% of the time was spent face to face discussing her case and in floor time, coordinating the patient's care.    Carola Rhine, PAC

## 2018-03-14 ENCOUNTER — Other Ambulatory Visit: Payer: Self-pay | Admitting: Radiation Oncology

## 2018-03-14 MED ORDER — LORAZEPAM 0.5 MG PO TABS: ORAL_TABLET | ORAL | 0 refills | Status: AC

## 2018-03-16 ENCOUNTER — Ambulatory Visit
Admission: RE | Admit: 2018-03-16 | Discharge: 2018-03-16 | Disposition: A | Discharge status: Home / Self Care | Payer: BLUE CROSS/BLUE SHIELD | Source: Ambulatory Visit | Attending: Radiation Oncology | Admitting: Radiation Oncology

## 2018-03-16 DIAGNOSIS — C7931 Secondary malignant neoplasm of brain: Secondary | ICD-10-CM

## 2018-03-16 DIAGNOSIS — C7949 Secondary malignant neoplasm of other parts of nervous system: Principal | ICD-10-CM

## 2018-03-16 MED ORDER — GADOBENATE DIMEGLUMINE 529 MG/ML IV SOLN
14.0000 mL | Freq: Once | INTRAVENOUS | Status: AC | PRN
  Administered 2018-03-16: 14 mL via INTRAVENOUS

## 2018-03-17 ENCOUNTER — Encounter: Payer: Self-pay | Admitting: Radiation Oncology

## 2018-03-17 ENCOUNTER — Inpatient Hospital Stay: Payer: BLUE CROSS/BLUE SHIELD | Attending: Hematology and Oncology | Admitting: Hematology and Oncology

## 2018-03-17 ENCOUNTER — Other Ambulatory Visit: Payer: Self-pay | Admitting: Hematology and Oncology

## 2018-03-17 ENCOUNTER — Encounter: Payer: Self-pay | Admitting: Hematology and Oncology

## 2018-03-17 ENCOUNTER — Telehealth: Payer: Self-pay | Admitting: Radiation Oncology

## 2018-03-17 ENCOUNTER — Telehealth: Payer: Self-pay | Admitting: Hematology and Oncology

## 2018-03-17 VITALS — BP 129/56 | HR 108 | Temp 98.2°F | Resp 18 | Ht 62.5 in | Wt 161.8 lb

## 2018-03-17 DIAGNOSIS — R197 Diarrhea, unspecified: Secondary | ICD-10-CM | POA: Insufficient documentation

## 2018-03-17 DIAGNOSIS — Z5111 Encounter for antineoplastic chemotherapy: Secondary | ICD-10-CM | POA: Insufficient documentation

## 2018-03-17 DIAGNOSIS — Z7984 Long term (current) use of oral hypoglycemic drugs: Secondary | ICD-10-CM | POA: Diagnosis not present

## 2018-03-17 DIAGNOSIS — Z794 Long term (current) use of insulin: Secondary | ICD-10-CM | POA: Diagnosis not present

## 2018-03-17 DIAGNOSIS — C7951 Secondary malignant neoplasm of bone: Secondary | ICD-10-CM | POA: Insufficient documentation

## 2018-03-17 DIAGNOSIS — B001 Herpesviral vesicular dermatitis: Secondary | ICD-10-CM | POA: Insufficient documentation

## 2018-03-17 DIAGNOSIS — Z171 Estrogen receptor negative status [ER-]: Secondary | ICD-10-CM

## 2018-03-17 DIAGNOSIS — E1165 Type 2 diabetes mellitus with hyperglycemia: Secondary | ICD-10-CM

## 2018-03-17 DIAGNOSIS — D61818 Other pancytopenia: Secondary | ICD-10-CM | POA: Insufficient documentation

## 2018-03-17 DIAGNOSIS — R05 Cough: Secondary | ICD-10-CM | POA: Insufficient documentation

## 2018-03-17 DIAGNOSIS — C7931 Secondary malignant neoplasm of brain: Secondary | ICD-10-CM | POA: Diagnosis not present

## 2018-03-17 DIAGNOSIS — K219 Gastro-esophageal reflux disease without esophagitis: Secondary | ICD-10-CM

## 2018-03-17 DIAGNOSIS — C787 Secondary malignant neoplasm of liver and intrahepatic bile duct: Secondary | ICD-10-CM | POA: Diagnosis not present

## 2018-03-17 DIAGNOSIS — Z7189 Other specified counseling: Secondary | ICD-10-CM

## 2018-03-17 DIAGNOSIS — C50919 Malignant neoplasm of unspecified site of unspecified female breast: Secondary | ICD-10-CM | POA: Insufficient documentation

## 2018-03-17 DIAGNOSIS — Z5112 Encounter for antineoplastic immunotherapy: Secondary | ICD-10-CM | POA: Insufficient documentation

## 2018-03-17 DIAGNOSIS — Z79899 Other long term (current) drug therapy: Secondary | ICD-10-CM | POA: Diagnosis not present

## 2018-03-17 MED ORDER — METFORMIN HCL 500 MG PO TABS: 500.0000 mg | ORAL_TABLET | Freq: Two times a day (BID) | ORAL | 1 refills | Status: DC

## 2018-03-17 MED ORDER — VALACYCLOVIR HCL 1 G PO TABS: 2000.0000 mg | ORAL_TABLET | Freq: Two times a day (BID) | ORAL | 0 refills | Status: DC

## 2018-03-17 NOTE — Assessment & Plan Note (Signed)
She is not symptomatic.  We will monitor her liver function carefully while on treatment

## 2018-03-17 NOTE — Assessment & Plan Note (Signed)
I recommend short course of Valtrex

## 2018-03-17 NOTE — Progress Notes (Signed)
I spoke with the patient, her sister, mother, and friend and we discussed the plans for [facility], delivery, logistics, side effects of therapy, and long term expectations with therapy. She's scheduled for simulation tomorrow.

## 2018-03-17 NOTE — Telephone Encounter (Signed)
Gave avs and calendar ° °

## 2018-03-17 NOTE — Assessment & Plan Note (Addendum)
She is not symptomatic. She will benefit from IV bisphosphonates. I recommend her to visit with a dentist to obtain dental clearance.

## 2018-03-17 NOTE — Assessment & Plan Note (Signed)
This is due to bone marrow involvement.  She might need intermittent transfusion support while on chemotherapy. We might need dose adjustment as well

## 2018-03-17 NOTE — Assessment & Plan Note (Addendum)
We have extensive discussion about standard treatment versus clinical trial Due to waiting period of 4 weeks after radiation treatment to the brain, I am not willing for her to wait and get enrolled in clinical trial We reviewed the current guidelines I recommend extended treatment with Taxotere, Perjeta and trastuzumab. I recommend port placement, baseline echocardiogram and chemo education class before we proceed with We might need dose adjustment due to baseline pancytopenia from bone marrow involvement Her treatment is aware that intent is palliative due to stage IV disease She will proceed with radiation therapy as scheduled We discussed cranial prosthesis I will get pathologist to add additional molecular profiling for treatment options in the future

## 2018-03-17 NOTE — Progress Notes (Addendum)
[place] OFFICE PROGRESS NOTE  Patient Care Team: Robyne Peers, MD as PCP - General (Family Medicine)  ASSESSMENT & PLAN:  Metastatic breast cancer [facility]) We have extensive discussion about standard treatment versus clinical trial Due to waiting period of 4 weeks after radiation treatment to the brain, I am not willing for her to wait and get enrolled in clinical trial We reviewed the current guidelines I recommend extended treatment with Taxotere, Perjeta and trastuzumab. I recommend port placement, baseline echocardiogram and chemo education class before we proceed with We might need dose adjustment due to baseline pancytopenia from bone marrow involvement Her treatment is aware that intent is palliative due to stage IV disease She will proceed with radiation therapy as scheduled We discussed cranial prosthesis I will get pathologist to add additional molecular profiling for treatment options in the future  Metastasis to brain [facility]) She is scheduled for radiation treatment as planned  Metastasis to liver [facility]) She is not symptomatic.  We will monitor her liver function carefully while on treatment  Pancytopenia, acquired [facility]) This is due to bone marrow involvement.  She might need intermittent transfusion support while on chemotherapy. We might need dose adjustment as well  Metastasis to bone [facility]) She is not symptomatic. She will benefit from IV bisphosphonates. I recommend her to visit with a dentist to obtain dental clearance.  Herpes labialis I recommend short course of Valtrex  Uncontrolled diabetes mellitus with hyperglycemia ([place]) We discussed the importance of dietary modification and weight loss I recommend starting her on metformin  Goals of care, counseling/discussion The patient is aware she has incurable disease and treatment is strictly palliative. We discussed importance of Advanced Directives and Living will.   Orders Placed This  Encounter  Procedures  . IR IMAGING GUIDED PORT INSERTION    Standing Status:   Future    Standing Expiration Date:   05/16/2019    Order Specific Question:   Reason for Exam (SYMPTOM  OR DIAGNOSIS REQUIRED)    Answer:   need port for chemo on 2/26    Order Specific Question:   Is the patient pregnant?    Answer:   No    Order Specific Question:   Preferred Imaging Location?    Answer:   [facility]  . CBC with Differential ([place] Only)    Standing Status:   Standing    Number of Occurrences:   20    Standing Expiration Date:   03/18/2019  . CMP ([place] only)    Standing Status:   Standing    Number of Occurrences:   20    Standing Expiration Date:   03/18/2019  . Uric acid    Standing Status:   Standing    Number of Occurrences:   2    Standing Expiration Date:   03/18/2019  . ECHOCARDIOGRAM COMPLETE    Standing Status:   Future    Standing Expiration Date:   06/15/2019    Order Specific Question:   Where should this test be performed    Answer:   [place]    Order Specific Question:   Perflutren DEFINITY (image enhancing agent) should be administered unless hypersensitivity or allergy exist    Answer:   Administer Perflutren    Order Specific Question:   Reason for exam-Echo    Answer:   Chemotherapy evaluation  v87.41 / v58.11  . Sample to Blood Bank    Standing Status:   Standing  Number of Occurrences:   33    Standing Expiration Date:   03/18/2019    INTERVAL HISTORY: Please see below for problem oriented charting. She returns with her sister, mother and best friend for further discussion about plan of care Since discharge from hospital, she has been feeling well Denies new neurological deficits. No bone pain. She continues to have nonproductive cough. Her blood sugar has ranged between 202 to 350 She complained of recurrent herpes simplex  SUMMARY OF ONCOLOGIC HISTORY: Oncology History   Biopsy is ER negative Her2/neu positive      Metastatic breast cancer ([place])   03/11/2018 Imaging    Ct scan of chest, abdomen and pelvis 1. 2.6 x 2.3 cm spiculated mass identified inferomedial quadrant of the left breast. Lesion appears to retract the overlying skin. Mammographic correlation recommended. 2. Small lymph nodes in the left axillar ill-defined and suspicious for metastatic disease. 3. Multiple bilateral pulmonary nodules measuring up to 2.7 cm diameter. These probably represent metastatic disease. Given the dominant size of the central right upper lobe lesion, synchronous lung primary can not be completely excluded. 4. Multiple ill-defined liver lesions compatible with metastatic disease. Dominant liver metastases measure up to almost 4 cm. 5.  Aortic Atherosclerois (ICD10-170.0)     03/12/2018 Pathology Results    Liver, needle/core biopsy, left lobe - METASTATIC CARCINOMA. - LYMPHOVASCULAR INVASION IS IDENTIFIED. - SEE COMMENT. Microscopic Comment The tumor cells are positive for cytokeratin 7. There is faint staining for GATA-3. There is non-specific staining for TTF-1. Cytokeratin 20, CDX-2, estrogen receptor, and GCDFP stains are negative. Given the clinical suspicion, the profile supports a primary breast carcinoma. Her2 will be performed and the results reported separately.  By immunohistochemistry, the tumor cells are POSITIVE for Her2 (3+).    03/12/2018 Imaging    Single 1.3 x 1.4 cm RIGHT occipital metastasis.  Borderline pachymeningeal enhancement of symmetric nature could be related to the superficial metastasis or osseous disease.    03/12/2018 Procedure    Ultrasound-guided core biopsy performed of a mass within the lateral segment of the left lobe of the liver.    03/16/2018 Imaging    Right occipital lobe 13 x 16 mm. Small satellite enhancing nodule deep to the larger lesion. The larger lesion shows central necrosis and probable mild hemorrhage or calcification.  Mild dural enhancement is less  impressive and could be within normal limits.  Bone marrow in the clivus and cervical spine is diffusely low signal. No focal lesion. This may be related to the patient's anemia and abnormal blood count.    03/17/2018 Cancer Staging    Staging form: Breast, AJCC 8th Edition - Clinical: Stage IV (cT2, cN0, pM1, GX, ER-, PR: Not Assessed, HER2+) - Signed by [name], MD on 03/17/2018     Metastasis to brain [facility])   03/17/2018 Initial Diagnosis    Metastasis to brain [facility])     Metastasis to liver ([place])   03/17/2018 Initial Diagnosis    Metastasis to liver ([facility])     Metastasis to bone ([place])   03/17/2018 Initial Diagnosis    Metastasis to bone ([facility])     REVIEW OF SYSTEMS:   Constitutional: Denies fevers, chills or abnormal weight loss Eyes: Denies blurriness of vision Ears, nose, mouth, throat, and face: Denies mucositis or sore throat Cardiovascular: Denies palpitation, chest discomfort or lower extremity swelling Gastrointestinal:  Denies nausea, heartburn or change in bowel habits Skin: Denies abnormal skin rashes Lymphatics: Denies new lymphadenopathy or easy bruising  Neurological:Denies numbness, tingling or new weaknesses Behavioral/Psych: Mood is stable, no new changes  All other systems were reviewed with the patient and are negative.  I have reviewed the past medical history, past surgical history, social history and family history with the patient and they are unchanged from previous note.  ALLERGIES:  is allergic to nsaids.  MEDICATIONS:  Current Outpatient Medications  Medication Sig Dispense Refill  . albuterol (PROAIR HFA) 108 (90 Base) MCG/ACT inhaler Inhale 2 puffs into the lungs every 6 (six) hours as needed for wheezing or shortness of breath. 1 Inhaler 1  . blood glucose meter kit and supplies KIT Dispense based on patient and insurance preference. Use up to four times daily as directed. (FOR ICD-9 250.00, 250.01). 1 each 0  . esomeprazole (NEXIUM) 40 MG  capsule Take 1 capsule (40 mg total) by mouth 2 (two) times daily before a meal. 180 capsule 1  . fluticasone (FLOVENT HFA) 44 MCG/ACT inhaler Inhale 2 puffs into the lungs 2 (two) times daily. 3 Inhaler 1  . insulin aspart (NOVOLOG) 100 UNIT/ML FlexPen Inject 8 Units into the skin 3 (three) times daily with meals. 15 mL 0  . Insulin Detemir (LEVEMIR) 100 UNIT/ML Pen Inject 20 Units into the skin daily. 15 mL 0  . Insulin Pen Needle 31G X 5 MM MISC 1 Device by Does not apply route QID. For use with insulin pens 100 each 0  . LORazepam (ATIVAN) 0.5 MG tablet Take 1 tab po 30 minutes prior to radiation or MRI 30 tablet 0  . metFORMIN (GLUCOPHAGE) 500 MG tablet Take 1 tablet (500 mg total) by mouth 2 (two) times daily with a meal. 60 tablet 1  . metoprolol succinate (TOPROL-XL) 50 MG 24 hr tablet Take 50 mg by mouth every evening.    . montelukast (SINGULAIR) 10 MG tablet TAKE 1 TABLET BY MOUTH EVERYDAY AT BEDTIME (Patient taking differently: Take 10 mg by mouth daily. ) 90 tablet 0  . Multiple Vitamin (MULTIVITAMIN WITH MINERALS) TABS tablet Take 1 tablet by mouth daily.    . ranitidine (ZANTAC) 300 MG tablet Take 1 tablet (300 mg total) by mouth at bedtime. (Patient taking differently: Take 300 mg by mouth at bedtime as needed for heartburn. ) 90 tablet 1  . triamcinolone (NASACORT ALLERGY 24HR) 55 MCG/ACT AERO nasal inhaler Place 2 sprays into the nose daily.    . valACYclovir (VALTREX) 1000 MG tablet Take 2 tablets (2,000 mg total) by mouth 2 (two) times daily. 4 tablet 0   No current facility-administered medications for this visit.     PHYSICAL EXAMINATION: ECOG PERFORMANCE STATUS: 1 - Symptomatic but completely ambulatory  Vitals:   03/17/18 1413  BP: (!) 129/56  Pulse: (!) 108  Resp: 18  Temp: 98.2 F (36.8 C)  SpO2: 97%   Filed Weights   03/17/18 1413  Weight: 161 lb 12.8 oz (73.4 kg)    GENERAL:alert, no distress and comfortable SKIN: skin color, texture, turgor are normal,  no rashes or significant lesions EYES: normal, Conjunctiva are pink and non-injected, sclera clear OROPHARYNX:no exudate, no erythema and lips, buccal mucosa, and tongue normal. Noted blisters in her upper lip NEURO: alert & oriented x 3 with fluent speech, no focal motor/sensory deficits  LABORATORY DATA:  I have reviewed the data as listed    Component Value Date/Time   NA 128 (L) 03/11/2018 0013   K 3.4 (L) 03/11/2018 0013   CL 92 (L) 03/11/2018 0013   CO2 20 (  L) 03/11/2018 0013   GLUCOSE 425 (H) 03/11/2018 0013   BUN 14 03/11/2018 0013   CREATININE 0.67 03/11/2018 0013   CALCIUM 9.0 03/11/2018 0013   PROT 7.1 03/10/2018 2207   ALBUMIN 3.7 03/10/2018 2207   AST 14 (L) 03/10/2018 2207   ALT 13 03/10/2018 2207   ALKPHOS 78 03/10/2018 2207   BILITOT 1.2 03/10/2018 2207   GFRNONAA >60 03/11/2018 0013   GFRAA >60 03/11/2018 0013    No results found for: SPEP, UPEP  Lab Results  Component Value Date   WBC 6.0 03/12/2018   NEUTROABS 3.9 03/12/2018   HGB 9.6 (L) 03/12/2018   HCT 29.8 (L) 03/12/2018   MCV 88.2 03/12/2018   PLT 62 (L) 03/12/2018      Chemistry      Component Value Date/Time   NA 128 (L) 03/11/2018 0013   K 3.4 (L) 03/11/2018 0013   CL 92 (L) 03/11/2018 0013   CO2 20 (L) 03/11/2018 0013   BUN 14 03/11/2018 0013   CREATININE 0.67 03/11/2018 0013      Component Value Date/Time   CALCIUM 9.0 03/11/2018 0013   ALKPHOS 78 03/10/2018 2207   AST 14 (L) 03/10/2018 2207   ALT 13 03/10/2018 2207   BILITOT 1.2 03/10/2018 2207       RADIOGRAPHIC STUDIES: I have personally reviewed the radiological images as listed and agreed with the findings in the report. Ct Chest W Contrast  Result Date: 03/11/2018 CLINICAL DATA:  Weight loss in thrombocytopenia. EXAM: CT CHEST, ABDOMEN, AND PELVIS WITH CONTRAST TECHNIQUE: Multidetector CT imaging of the chest, abdomen and pelvis was performed following the standard protocol during bolus administration of intravenous  contrast. CONTRAST:  132m OMNIPAQUE IOHEXOL 300 MG/ML SOLN, 370mOMNIPAQUE IOHEXOL 300 MG/ML SOLN COMPARISON:  None. FINDINGS: CT CHEST FINDINGS Cardiovascular: The heart size is normal. No substantial pericardial effusion. Atherosclerotic calcification is noted in the wall of the thoracic aorta. Mediastinum/Nodes: 13 mm short axis precarinal lymph node mildly enlarged. 13 mm densely calcified subcarinal lymph node is associated with calcified nodal tissue in the right hilum. No left hilar lymphadenopathy. 9 mm short axis left axillary lymph node is associated with small additional left axillary and subpectoral nodes. No right hilar adenopathy. Lungs/Pleura: The central tracheobronchial airways are patent. 2.7 x 2.2 cm central right upper lobe nodule identified on 56/4. Numerous additional pulmonary nodules are identified bilaterally ranging in size from several mm up to 10 mm in the right upper lobe. These nodules have a slight upper lung predominance. Index 7 mm right lower lobe nodule identified on 64/4. No pleural effusion. Calcified granuloma noted posterior right costophrenic sulcus. Musculoskeletal: No worrisome lytic or sclerotic osseous abnormality. Masslike asymmetry of left breast tissue identified in the medial inferior quadrant of the left breast (40/2) measuring 2.6 x 2.3 cm. Coronal imaging suggests that this lesion has spiculated margins (22/5). CT ABDOMEN PELVIS FINDINGS Hepatobiliary: Multiple (approximately 10) ill-defined liver lesions are concerning for metastases. Dominant lesions are a 3.6 cm lesion in the right liver (54/2) and a 3.8 cm lesion identified lateral segment left liver (58/2). Other lesions range in size from 8 mm up to 2.6 cm. There is no evidence for gallstones, gallbladder wall thickening, or pericholecystic fluid. No intrahepatic or extrahepatic biliary dilation. Pancreas: No focal mass lesion. No dilatation of the main duct. No intraparenchymal cyst. No peripancreatic edema.  Spleen: Calcified granulomata. Adrenals/Urinary Tract: No adrenal nodule or mass. Kidneys unremarkable. Central sinus cysts noted in the left kidney.  No evidence for hydroureter. The urinary bladder appears normal for the degree of distention. Stomach/Bowel: Stomach is unremarkable. No gastric wall thickening. No evidence of outlet obstruction. Duodenum is normally positioned as is the ligament of Treitz. No small bowel wall thickening. No small bowel dilatation. The terminal ileum is normal. The appendix is not visualized, but there is no edema or inflammation in the region of the cecum. No gross colonic mass. No colonic wall thickening. Vascular/Lymphatic: There is abdominal aortic atherosclerosis without aneurysm. There is no gastrohepatic or hepatoduodenal ligament lymphadenopathy. No intraperitoneal or retroperitoneal lymphadenopathy. No pelvic sidewall lymphadenopathy. Reproductive: The uterus is unremarkable.  There is no adnexal mass. Other: No intraperitoneal free fluid. Musculoskeletal: No worrisome lytic or sclerotic osseous abnormality. IMPRESSION: 1. 2.6 x 2.3 cm spiculated mass identified inferomedial quadrant of the left breast. Lesion appears to retract the overlying skin. Mammographic correlation recommended. 2. Small lymph nodes in the left axillar ill-defined and suspicious for metastatic disease. 3. Multiple bilateral pulmonary nodules measuring up to 2.7 cm diameter. These probably represent metastatic disease. Given the dominant size of the central right upper lobe lesion, synchronous lung primary can not be completely excluded. 4. Multiple ill-defined liver lesions compatible with metastatic disease. Dominant liver metastases measure up to almost 4 cm. 5.  Aortic Atherosclerois (ICD10-170.0) Electronically Signed   By: Misty Stanley M.D.   On: 03/11/2018 18:30   Mr Jeri Cos FI Contrast  Result Date: 03/17/2018 CLINICAL DATA:  Secondary malignant neoplasm of brain. SRS treatment planning.  EXAM: MRI HEAD WITHOUT AND WITH CONTRAST TECHNIQUE: Multiplanar, multiecho pulse sequences of the brain and surrounding structures were obtained without and with intravenous contrast. CONTRAST:  26m MULTIHANCE GADOBENATE DIMEGLUMINE 529 MG/ML IV SOLN COMPARISON:  MRI head 03/12/2018 FINDINGS: Brain: Solitary metastatic deposit in the right occipital lobe measures 16 x 13 mm. This measures slightly larger attributed to thinner sections on the current study. Small enhancing nodule is present immediately deep to the largest lesion. The lesions show central necrosis and is peripherally located. Punctate foci of mineralization likely hemorrhage or calcification within the lesion. Mild adjacent white matter edema. No other enhancing mass lesion identified. Mild meningeal enhancement is less impressive compared to the prior study. Ventricle size normal. No midline shift. Negative for acute infarct. Mild chronic ischemic changes in the white matter. Vascular: Normal arterial flow voids Skull and upper cervical spine: Cervical spine bone marrow diffusely low signal intensity on T1. No focal lesions identified. Clivus also shows low signal bone marrow. Sinuses/Orbits: Normal orbit moderate mucosal edema paranasal sinuses none. Other: Solitary metastatic deposit IMPRESSION: Right occipital lobe 13 x 16 mm. Small satellite enhancing nodule deep to the larger lesion. The larger lesion shows central necrosis and probable mild hemorrhage or calcification. Mild dural enhancement is less impressive and could be within normal limits. Bone marrow in the clivus and cervical spine is diffusely low signal. No focal lesion. This may be related to the patient's anemia and abnormal blood count. Electronically Signed   By: CFranchot GalloM.D.   On: 03/17/2018 09:08   Mr BJeri CosWEPContrast  Result Date: 03/12/2018 CLINICAL DATA:  Metastatic breast cancer.  Gait disturbance. EXAM: MRI HEAD WITHOUT AND WITH CONTRAST TECHNIQUE:  Multiplanar, multiecho pulse sequences of the brain and surrounding structures were obtained without and with intravenous contrast. CONTRAST:  Gadavist 7 mL. COMPARISON:  None. FINDINGS: Brain: 13 x 14 x 13 mm metastasis, RIGHT lateral occipital lobe, mild surrounding edema. No midline shift. No other definite  metastatic lesions. No acute stroke, hemorrhage hydrocephalus. Slight premature for age white matter disease. Other than the necrotic metastasis, no abnormal enhancement of the brain. Borderline pachymeningeal enhancement of a symmetric nature, could be related to the superficial metastasis. No leptomeningeal enhancement. Vascular: Normal flow voids. Skull and upper cervical spine: Diffusely abnormal marrow in the clivus and upper cervical region, concerning for metastatic disease. Sinuses/Orbits: No significant sinus disease.  Negative orbits. Other: None. IMPRESSION: Single 1.3 x 1.4 cm RIGHT occipital metastasis. Borderline pachymeningeal enhancement of symmetric nature could be related to the superficial metastasis or osseous disease. A call has been placed to the ordering provider. Electronically Signed   By: Staci Righter M.D.   On: 03/12/2018 13:28   Ct Abdomen Pelvis W Contrast  Result Date: 03/11/2018 CLINICAL DATA:  Weight loss in thrombocytopenia. EXAM: CT CHEST, ABDOMEN, AND PELVIS WITH CONTRAST TECHNIQUE: Multidetector CT imaging of the chest, abdomen and pelvis was performed following the standard protocol during bolus administration of intravenous contrast. CONTRAST:  177m OMNIPAQUE IOHEXOL 300 MG/ML SOLN, 336mOMNIPAQUE IOHEXOL 300 MG/ML SOLN COMPARISON:  None. FINDINGS: CT CHEST FINDINGS Cardiovascular: The heart size is normal. No substantial pericardial effusion. Atherosclerotic calcification is noted in the wall of the thoracic aorta. Mediastinum/Nodes: 13 mm short axis precarinal lymph node mildly enlarged. 13 mm densely calcified subcarinal lymph node is associated with calcified  nodal tissue in the right hilum. No left hilar lymphadenopathy. 9 mm short axis left axillary lymph node is associated with small additional left axillary and subpectoral nodes. No right hilar adenopathy. Lungs/Pleura: The central tracheobronchial airways are patent. 2.7 x 2.2 cm central right upper lobe nodule identified on 56/4. Numerous additional pulmonary nodules are identified bilaterally ranging in size from several mm up to 10 mm in the right upper lobe. These nodules have a slight upper lung predominance. Index 7 mm right lower lobe nodule identified on 64/4. No pleural effusion. Calcified granuloma noted posterior right costophrenic sulcus. Musculoskeletal: No worrisome lytic or sclerotic osseous abnormality. Masslike asymmetry of left breast tissue identified in the medial inferior quadrant of the left breast (40/2) measuring 2.6 x 2.3 cm. Coronal imaging suggests that this lesion has spiculated margins (22/5). CT ABDOMEN PELVIS FINDINGS Hepatobiliary: Multiple (approximately 10) ill-defined liver lesions are concerning for metastases. Dominant lesions are a 3.6 cm lesion in the right liver (54/2) and a 3.8 cm lesion identified lateral segment left liver (58/2). Other lesions range in size from 8 mm up to 2.6 cm. There is no evidence for gallstones, gallbladder wall thickening, or pericholecystic fluid. No intrahepatic or extrahepatic biliary dilation. Pancreas: No focal mass lesion. No dilatation of the main duct. No intraparenchymal cyst. No peripancreatic edema. Spleen: Calcified granulomata. Adrenals/Urinary Tract: No adrenal nodule or mass. Kidneys unremarkable. Central sinus cysts noted in the left kidney. No evidence for hydroureter. The urinary bladder appears normal for the degree of distention. Stomach/Bowel: Stomach is unremarkable. No gastric wall thickening. No evidence of outlet obstruction. Duodenum is normally positioned as is the ligament of Treitz. No small bowel wall thickening. No  small bowel dilatation. The terminal ileum is normal. The appendix is not visualized, but there is no edema or inflammation in the region of the cecum. No gross colonic mass. No colonic wall thickening. Vascular/Lymphatic: There is abdominal aortic atherosclerosis without aneurysm. There is no gastrohepatic or hepatoduodenal ligament lymphadenopathy. No intraperitoneal or retroperitoneal lymphadenopathy. No pelvic sidewall lymphadenopathy. Reproductive: The uterus is unremarkable.  There is no adnexal mass. Other: No intraperitoneal free fluid.  Musculoskeletal: No worrisome lytic or sclerotic osseous abnormality. IMPRESSION: 1. 2.6 x 2.3 cm spiculated mass identified inferomedial quadrant of the left breast. Lesion appears to retract the overlying skin. Mammographic correlation recommended. 2. Small lymph nodes in the left axillar ill-defined and suspicious for metastatic disease. 3. Multiple bilateral pulmonary nodules measuring up to 2.7 cm diameter. These probably represent metastatic disease. Given the dominant size of the central right upper lobe lesion, synchronous lung primary can not be completely excluded. 4. Multiple ill-defined liver lesions compatible with metastatic disease. Dominant liver metastases measure up to almost 4 cm. 5.  Aortic Atherosclerois (ICD10-170.0) Electronically Signed   By: Misty Stanley M.D.   On: 03/11/2018 18:30   US Biopsy (liver)  Result Date: 03/12/2018 INDICATION: Multiple liver masses, lung lesions and left breast mass. The patient presents for biopsy of a liver lesion. EXAM: ULTRASOUND GUIDED CORE BIOPSY OF LIVER MEDICATIONS: None. ANESTHESIA/SEDATION: Fentanyl 100 mcg IV; Versed 2.0 mg IV Moderate Sedation Time:  20 minutes. The patient was continuously monitored during the procedure by the interventional radiology nurse under my direct supervision. PROCEDURE: The procedure, risks, benefits, and alternatives were explained to the patient. Questions regarding the  procedure were encouraged and answered. The patient understands and consents to the procedure. A time-out was performed prior to initiating the procedure. Initial ultrasound was performed to localize liver lesions. The epigastric region was prepped with chlorhexidine in a sterile fashion, and a sterile drape was applied covering the operative field. A sterile gown and sterile gloves were used for the procedure. Local anesthesia was provided with 1% Lidocaine. Under ultrasound guidance, a 17 gauge trocar needle was advanced into a lesion within the left lobe of the liver. After confirming needle tip position, 3 coaxial 18 gauge core biopsy samples were obtained. Material was submitted in formalin. After the procedure Gel-Foam pledgets were advanced through the outer needle as the needle was retracted. COMPLICATIONS: None immediate. 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 The largest and most accessible by ultrasound was within the lateral segment of the left lobe and measures just over 3.5 cm in estimated maximal diameter. Two intact solid core biopsy samples were obtained. The third throw of the biopsy device did not yield much tissue. There were no immediate bleeding complications. IMPRESSION: Ultrasound-guided core biopsy performed of a mass within the lateral segment of the left lobe of the liver. Electronically Signed   By: Aletta Edouard M.D.   On: 03/12/2018 16:43    All questions were answered. The patient knows to call the clinic with any problems, questions or concerns. No barriers to learning was detected.  I spent 70 minutes counseling the patient face to face. The total time spent in the appointment was 90 minutes and more than 50% was on counseling and review of test results  [name], MD 03/18/2018 8:14 AM

## 2018-03-17 NOTE — Progress Notes (Signed)
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 

## 2018-03-17 NOTE — Assessment & Plan Note (Signed)
The patient is aware she has incurable disease and treatment is strictly palliative. We discussed importance of Advanced Directives and Living will. 

## 2018-03-17 NOTE — Telephone Encounter (Signed)
I called the patient to let her know her MRI results from her 3T study. We will proceed with SRS as previously outlined. She will come for simulation tomorrow and we did review her pathology results. She will meet with Dr. Alvy Bimler later today to review treatment recommendations.

## 2018-03-17 NOTE — Assessment & Plan Note (Signed)
She is scheduled for radiation treatment as planned

## 2018-03-17 NOTE — Assessment & Plan Note (Signed)
We discussed the importance of dietary modification and weight loss I recommend starting her on metformin

## 2018-03-18 ENCOUNTER — Encounter: Payer: Self-pay | Admitting: *Deleted

## 2018-03-18 ENCOUNTER — Ambulatory Visit
Admission: RE | Admit: 2018-03-18 | Discharge: 2018-03-18 | Disposition: A | Discharge status: Home / Self Care | Payer: BLUE CROSS/BLUE SHIELD | Source: Ambulatory Visit | Attending: Radiation Oncology | Admitting: Radiation Oncology

## 2018-03-18 ENCOUNTER — Ambulatory Visit
Admission: RE | Admit: 2018-03-18 | Payer: BLUE CROSS/BLUE SHIELD | Source: Ambulatory Visit | Admitting: Radiation Oncology

## 2018-03-18 ENCOUNTER — Encounter: Payer: Self-pay | Admitting: Hematology and Oncology

## 2018-03-18 ENCOUNTER — Telehealth: Payer: Self-pay | Admitting: *Deleted

## 2018-03-18 ENCOUNTER — Other Ambulatory Visit: Payer: Self-pay

## 2018-03-18 ENCOUNTER — Inpatient Hospital Stay: Payer: BLUE CROSS/BLUE SHIELD

## 2018-03-18 DIAGNOSIS — Z510 Encounter for antineoplastic radiation therapy: Secondary | ICD-10-CM | POA: Diagnosis not present

## 2018-03-18 DIAGNOSIS — C7931 Secondary malignant neoplasm of brain: Secondary | ICD-10-CM | POA: Insufficient documentation

## 2018-03-18 MED ORDER — SODIUM CHLORIDE 0.9% FLUSH
10.0000 mL | Freq: Once | INTRAVENOUS | Status: AC
  Administered 2018-03-18: 10 mL via INTRAVENOUS

## 2018-03-18 NOTE — Progress Notes (Signed)
  Radiation Oncology         (336) [phone] ________________________________  Name: Stacy Shelton MRN: [id number]  Date: 03/18/2018  DOB: [birth year]  DIAGNOSIS:     ICD-10-CM   1. 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  Identity was confirmed.  All relevant records and images related to the planned course of therapy were reviewed.  The patient freely provided informed written consent to proceed with treatment after reviewing the details related to the planned course of therapy. The consent form was witnessed and verified by the simulation staff. Intravenous access was established for contrast administration. Then, the patient was set-up in a stable reproducible supine position for radiation therapy.  A relocatable thermoplastic stereotactic head frame was fabricated for precise immobilization.  CT images were obtained.  Surface markings were placed.  The CT images were loaded into the planning software and fused with the patient's targeting MRI scan.  Then the target and avoidance structures were contoured.  Treatment planning then occurred.  The radiation prescription was entered and confirmed.  I have requested 3D planning  I have requested a DVH of the following structures: Brain stem, brain, left eye, right eye, lenses, optic chiasm, target volumes, uninvolved brain, and normal tissue.    SPECIAL TREATMENT PROCEDURE:  The planned course of therapy using radiation constitutes a special treatment procedure. Special care is required in the management of this patient for the following reasons. This treatment constitutes a Special Treatment Procedure for the following reason: High dose per fraction requiring special monitoring for increased toxicities of treatment including daily imaging.  The special nature of the planned course of radiotherapy will require increased physician supervision and oversight to ensure patient's safety with  optimal treatment outcomes.  PLAN:  The patient will receive 20 Gy in 1 fraction.   ------------------------------------------------  Jodelle Gross, MD, PhD

## 2018-03-18 NOTE — Addendum Note (Signed)
Addended byAlvy Bimler, Chisum Habenicht on: 03/18/2018 08:14 AM   Modules accepted: Orders

## 2018-03-18 NOTE — Telephone Encounter (Signed)
Telephone call to patient to advise newly scheduled appointments. Left message for a return call.   Echo will be completed at [facility], Friday 03/21/18 at 10am- No prep is needed. Once she is finished she will come over to the [facility] for her Appt in this clinic.  Port Placement has been scheduled at [facility]. She will HOLD her Metformin that day. She may take her morning medication with sips of water. Nothing to eat or drink after Midnight the night before. She will need a driver.   The message forwarded to RN following Dr. Alvy Bimler the remainder of the week.

## 2018-03-18 NOTE — Progress Notes (Signed)
Has armband been applied?  Yes  Does patient have an allergy to IV contrast dye?: No   Has patient ever received premedication for IV contrast dye?: No  Does patient take metformin?: New prescription, has not started it yet.  If patient does take metformin when was the last dose: N/A  Date of lab work: 03/11/2018  BUN: 14 CR: 0.67 EGfr:>60  IV site: Right Hand  Has IV site been added to flowsheet?  Yes  LMP 10/30/2010

## 2018-03-18 NOTE — Progress Notes (Signed)
Called pt to introduce myself as her Arboriculturist and to discuss copay assistance.  Pt gave me consent to apply in her behalf so I enrolled her in [employer] program. She wasapproved for $25,000 for Herceptin &$25,000 for [place] for a year from2/18/20.  Pt's responsibility will be as little as $5 per infusion.  I also informed her of the [employer] and went over what it covers and gave her the income requirement.  She stated she makes 4 times that amount so she won't qualify for the grant at this time.  I will meet her on 03/21/18 to give her copies of the approval letters and my card for any questions or concerns she may have in the future.

## 2018-03-18 NOTE — Telephone Encounter (Signed)
-----   Message from [name], MD sent at 03/17/2018  4:22 PM EST ----- Regarding: ECHo and port placement I have ordered ECHo (requested [facility]) and port placement (requested [facility]) to be done soon Chemo to start 2/26 Please make sure they get scheduled

## 2018-03-19 ENCOUNTER — Other Ambulatory Visit: Payer: Self-pay | Admitting: Hematology and Oncology

## 2018-03-19 ENCOUNTER — Telehealth: Payer: Self-pay

## 2018-03-19 ENCOUNTER — Ambulatory Visit
Admission: RE | Admit: 2018-03-19 | Payer: BLUE CROSS/BLUE SHIELD | Source: Ambulatory Visit | Admitting: Radiation Oncology

## 2018-03-19 DIAGNOSIS — Z510 Encounter for antineoplastic radiation therapy: Secondary | ICD-10-CM | POA: Diagnosis not present

## 2018-03-19 NOTE — Telephone Encounter (Signed)
Epic shut down. Note reentered. She called and left a message. She has a dental appt with Dr. Posey Pronto for tomorrow at 09:10 for clearance for bisphosphonates and a tooth ache. Dr. Serita Grit office number

## 2018-03-19 NOTE — Telephone Encounter (Signed)
-----   Message from [name], MD sent at 03/19/2018  7:48 AM EST ----- Regarding: Monday morning I have placed hospital orders for CBC with Diff and CMP to be done before port placement. Can you get IR to do it? Also, when she was in the hospital, her platelet count was low. Can you verify that IR would still do it if her platelet is >50,000?  Thanks

## 2018-03-19 NOTE — Telephone Encounter (Signed)
Epic shut down again. Note reentered. She called and left a message. She has a dental appt with Dr. Posey Pronto for tomorrow at 09:10 for clearance for bisphosphonates and a tooth ache. Dr. Serita Grit office number [phone].  Called Dr. Serita Grit office. They are requesting clearance for dental work if needed tomorrow for tooth ache. Will ask Dr. Alvy Bimler tomorrow and call Dr. Serita Grit office back.  Called Ms. Owensby and told her to keep dental appt for tomorrow. The office will call Dr. Posey Pronto in the am with Dr. Alvy Bimler response. She verbalized understanding.

## 2018-03-19 NOTE — Telephone Encounter (Signed)
She called and left a message. She has a dental appt with Dr. Posey Pronto for tomorrow at 09:10 for clearance for  a

## 2018-03-19 NOTE — Telephone Encounter (Signed)
Called [facility] and given below message to add labs on the day of port placement. Caryl Pina verbalized understanding.  Caryl Pina will get Radiologist to review chart and if there is a problem will call office back. As long as platelets is great than 50,000 it should be okay.

## 2018-03-20 ENCOUNTER — Telehealth: Payer: Self-pay

## 2018-03-20 ENCOUNTER — Other Ambulatory Visit: Payer: Self-pay | Admitting: Radiology

## 2018-03-20 NOTE — Telephone Encounter (Signed)
Dr. Serita Grit office called to give update. They sent Stacy Shelton to a endodontists, she will probably need a root canal.

## 2018-03-21 ENCOUNTER — Encounter: Payer: Self-pay | Admitting: Radiation Oncology

## 2018-03-21 ENCOUNTER — Encounter: Payer: Self-pay | Admitting: Hematology and Oncology

## 2018-03-21 ENCOUNTER — Ambulatory Visit (HOSPITAL_COMMUNITY)
Admission: RE | Admit: 2018-03-21 | Discharge: 2018-03-21 | Disposition: A | Discharge status: Home / Self Care | Payer: BLUE CROSS/BLUE SHIELD | Source: Ambulatory Visit | Attending: Hematology and Oncology | Admitting: Hematology and Oncology

## 2018-03-21 ENCOUNTER — Ambulatory Visit
Admission: RE | Admit: 2018-03-21 | Discharge: 2018-03-21 | Disposition: A | Discharge status: Home / Self Care | Payer: BLUE CROSS/BLUE SHIELD | Source: Ambulatory Visit | Attending: Radiation Oncology | Admitting: Radiation Oncology

## 2018-03-21 ENCOUNTER — Inpatient Hospital Stay (HOSPITAL_BASED_OUTPATIENT_CLINIC_OR_DEPARTMENT_OTHER): Payer: BLUE CROSS/BLUE SHIELD | Admitting: Hematology and Oncology

## 2018-03-21 ENCOUNTER — Inpatient Hospital Stay: Payer: BLUE CROSS/BLUE SHIELD

## 2018-03-21 ENCOUNTER — Telehealth: Payer: Self-pay | Admitting: Hematology and Oncology

## 2018-03-21 VITALS — BP 137/66 | HR 110 | Temp 98.8°F | Resp 20

## 2018-03-21 VITALS — BP 126/51 | HR 113 | Temp 98.4°F | Resp 18 | Ht 62.5 in | Wt 155.6 lb

## 2018-03-21 DIAGNOSIS — C50919 Malignant neoplasm of unspecified site of unspecified female breast: Secondary | ICD-10-CM

## 2018-03-21 DIAGNOSIS — Z5111 Encounter for antineoplastic chemotherapy: Secondary | ICD-10-CM

## 2018-03-21 DIAGNOSIS — E1165 Type 2 diabetes mellitus with hyperglycemia: Secondary | ICD-10-CM | POA: Diagnosis not present

## 2018-03-21 DIAGNOSIS — Z794 Long term (current) use of insulin: Secondary | ICD-10-CM | POA: Diagnosis not present

## 2018-03-21 DIAGNOSIS — C7951 Secondary malignant neoplasm of bone: Secondary | ICD-10-CM | POA: Diagnosis not present

## 2018-03-21 DIAGNOSIS — C7931 Secondary malignant neoplasm of brain: Secondary | ICD-10-CM

## 2018-03-21 DIAGNOSIS — Z171 Estrogen receptor negative status [ER-]: Secondary | ICD-10-CM | POA: Diagnosis not present

## 2018-03-21 DIAGNOSIS — E119 Type 2 diabetes mellitus without complications: Secondary | ICD-10-CM | POA: Diagnosis not present

## 2018-03-21 DIAGNOSIS — C787 Secondary malignant neoplasm of liver and intrahepatic bile duct: Secondary | ICD-10-CM

## 2018-03-21 DIAGNOSIS — K219 Gastro-esophageal reflux disease without esophagitis: Secondary | ICD-10-CM | POA: Diagnosis not present

## 2018-03-21 DIAGNOSIS — R05 Cough: Secondary | ICD-10-CM | POA: Diagnosis not present

## 2018-03-21 DIAGNOSIS — Z79899 Other long term (current) drug therapy: Secondary | ICD-10-CM | POA: Diagnosis not present

## 2018-03-21 DIAGNOSIS — R197 Diarrhea, unspecified: Secondary | ICD-10-CM

## 2018-03-21 DIAGNOSIS — D61818 Other pancytopenia: Secondary | ICD-10-CM

## 2018-03-21 DIAGNOSIS — Z5112 Encounter for antineoplastic immunotherapy: Secondary | ICD-10-CM | POA: Diagnosis not present

## 2018-03-21 DIAGNOSIS — Z7984 Long term (current) use of oral hypoglycemic drugs: Secondary | ICD-10-CM

## 2018-03-21 DIAGNOSIS — B001 Herpesviral vesicular dermatitis: Secondary | ICD-10-CM | POA: Diagnosis not present

## 2018-03-21 LAB — CBC WITH DIFFERENTIAL (CANCER CENTER ONLY)
Abs Immature Granulocytes: 0.71 10*3/uL — ABNORMAL HIGH (ref 0.00–0.07)
Basophils Absolute: 0.3 10*3/uL — ABNORMAL HIGH (ref 0.0–0.1)
Basophils Relative: 4 %
Eosinophils Absolute: 0 10*3/uL (ref 0.0–0.5)
Eosinophils Relative: 0 %
HCT: 30 % — ABNORMAL LOW (ref 36.0–46.0)
Hemoglobin: 9.2 g/dL — ABNORMAL LOW (ref 12.0–15.0)
Immature Granulocytes: 11 %
Lymphocytes Relative: 33 %
Lymphs Abs: 2.2 10*3/uL (ref 0.7–4.0)
MCH: 26.8 pg (ref 26.0–34.0)
MCHC: 30.7 g/dL (ref 30.0–36.0)
MCV: 87.5 fL (ref 80.0–100.0)
Monocytes Absolute: 1 10*3/uL (ref 0.1–1.0)
Monocytes Relative: 15 %
Neutro Abs: 2.5 10*3/uL (ref 1.7–7.7)
Neutrophils Relative %: 37 %
Platelet Count: 91 10*3/uL — ABNORMAL LOW (ref 150–400)
RBC: 3.43 MIL/uL — ABNORMAL LOW (ref 3.87–5.11)
RDW: 21.8 % — ABNORMAL HIGH (ref 11.5–15.5)
WBC Count: 6.7 10*3/uL (ref 4.0–10.5)
nRBC: 1.6 % — ABNORMAL HIGH (ref 0.0–0.2)

## 2018-03-21 LAB — CMP (CANCER CENTER ONLY)
ALT: 10 U/L (ref 0–44)
AST: 15 U/L (ref 15–41)
Albumin: 3.5 g/dL (ref 3.5–5.0)
Alkaline Phosphatase: 114 U/L (ref 38–126)
Anion gap: 13 (ref 5–15)
BUN: 9 mg/dL (ref 6–20)
CO2: 26 mmol/L (ref 22–32)
Calcium: 10 mg/dL (ref 8.9–10.3)
Chloride: 101 mmol/L (ref 98–111)
Creatinine: 0.83 mg/dL (ref 0.44–1.00)
GFR, Est AFR Am: >60 mL/min (ref >60)
GFR, Estimated: >60 mL/min (ref >60)
Glucose, Bld: 179 mg/dL — ABNORMAL HIGH (ref 70–99)
Potassium: 4.9 mmol/L (ref 3.5–5.1)
Sodium: 140 mmol/L (ref 135–145)
TOTAL PROTEIN: 7.6 g/dL (ref 6.5–8.1)
Total Bilirubin: 0.6 mg/dL (ref 0.3–1.2)

## 2018-03-21 LAB — SAMPLE TO BLOOD BANK

## 2018-03-21 LAB — URIC ACID: Uric Acid, Serum: 4.4 mg/dL (ref 2.5–7.1)

## 2018-03-21 MED ORDER — LIDOCAINE-PRILOCAINE 2.5-2.5 % EX CREA: TOPICAL_CREAM | CUTANEOUS | 3 refills | Status: AC

## 2018-03-21 MED ORDER — ONDANSETRON HCL 8 MG PO TABS: 8.0000 mg | ORAL_TABLET | Freq: Three times a day (TID) | ORAL | 1 refills | Status: DC | PRN

## 2018-03-21 MED ORDER — DEXAMETHASONE 4 MG PO TABS: ORAL_TABLET | ORAL | 1 refills | Status: DC

## 2018-03-21 MED ORDER — PROCHLORPERAZINE MALEATE 10 MG PO TABS: 10.0000 mg | ORAL_TABLET | Freq: Four times a day (QID) | ORAL | 1 refills | Status: DC | PRN

## 2018-03-21 NOTE — Assessment & Plan Note (Signed)
Her blood sugar control has improved but I suspect she will get severe hyperglycemia again due to exposure to steroid therapy For that reason, I will reduce the dose of oral premedication dexamethasone before and after Taxotere She will continue aggressive dietary modification and insulin treatment as prescribed

## 2018-03-21 NOTE — Progress Notes (Signed)
  Echocardiogram 2D Echocardiogram has been performed.  Stacy Shelton 03/21/2018, 10:42 AM

## 2018-03-21 NOTE — Progress Notes (Signed)
[place] OFFICE PROGRESS NOTE  Patient Care Team: Robyne Peers, MD as PCP - General (Family Medicine)  ASSESSMENT & PLAN:  Metastatic breast cancer [facility]) Her baseline echocardiogram is within normal limits She will be completing palliative radiation to the brain today She will get port placement next week She is ready to begin palliative chemotherapy We discussed the role of chemotherapy. The intent is for palliative.  We discussed some of the risks, benefits, side-effects of Docetaxel, Perjeta and Herceptin   Some of the short term side-effects included, though not limited to, risk of severe allergic reaction, fatigue, weight loss, pancytopenia, life-threatening infections, cardiomyopathy, need for transfusions of blood products, nausea, vomiting, change in bowel habits, loss of hair, admission to hospital for various reasons, and risks of death.   Long term side-effects are also discussed including risks of infertility, permanent damage to nerve function, chronic fatigue, and rare secondary malignancy including bone marrow disorders.   The patient is aware that the response rates discussed earlier is not guaranteed.    After a long discussion, patient made an informed decision to proceed with the prescribed plan of care.  We discussed the risk of hair loss and discussed strategies She would like to just purchase a week.  Prescription was given to her last visit Due to her young age, she does not need G-CSF support She is aware of the need for repeat echocardiogram every 3 months while on treatment We will get her baseline blood work performed today.  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  We will  monitor her liver function carefully while on treatment  Metastasis to brain [facility]) She is scheduled for radiation treatment as planned  Metastasis to bone [facility]) She is not symptomatic. She will benefit from IV bisphosphonates. Due to an outstanding issue with a tooth, I recommend wait several weeks before we pursue IV bisphosphonates I will not prescribe on the first day of chemotherapy in case she might have significant side effects from either 1 of the treatment  Diarrhea She has diarrhea of unknown etiology I recommend reducing metformin to once a day If her diarrhea is persistent, she can take Imodium as needed  Uncontrolled diabetes mellitus with hyperglycemia (HCC) Her blood sugar control has improved but I suspect she will get severe hyperglycemia again due to exposure to steroid therapy For that reason, I will reduce the dose of oral premedication dexamethasone before and after Taxotere She will continue aggressive dietary modification and insulin treatment as prescribed   No orders of the defined types were placed in this encounter.   INTERVAL HISTORY: Please see below for problem oriented charting. She returns with her mother and sister for further follow-up Since last time I saw her, her mouth sore has almost completely resolved Her blood sugar control has improved Her only new symptom is diarrhea usually after meals The only new medication that was started from previous visit was with metformin and Valtrex  SUMMARY OF ONCOLOGIC HISTORY: Oncology History   Biopsy is ER negative Her2/neu positive     Metastatic breast cancer ([place])   03/11/2018 Imaging    Ct scan of chest, abdomen and pelvis 1. 2.6 x 2.3 cm spiculated mass identified inferomedial quadrant of the left  breast. Lesion appears to retract the overlying skin. Mammographic correlation recommended. 2. Small lymph nodes in the left axillar ill-defined and suspicious for metastatic disease. 3. Multiple  bilateral pulmonary nodules measuring up to 2.7 cm diameter. These probably represent metastatic disease. Given the dominant size of the central right upper lobe lesion, synchronous lung primary can not be completely excluded. 4. Multiple ill-defined liver lesions compatible with metastatic disease. Dominant liver metastases measure up to almost 4 cm. 5.  Aortic Atherosclerois (ICD10-170.0)     03/12/2018 Pathology Results    Liver, needle/core biopsy, left lobe - METASTATIC CARCINOMA. - LYMPHOVASCULAR INVASION IS IDENTIFIED. - SEE COMMENT. Microscopic Comment The tumor cells are positive for cytokeratin 7. There is faint staining for GATA-3. There is non-specific staining for TTF-1. Cytokeratin 20, CDX-2, estrogen receptor, and GCDFP stains are negative. Given the clinical suspicion, the profile supports a primary breast carcinoma. Her2 will be performed and the results reported separately.  By immunohistochemistry, the tumor cells are POSITIVE for Her2 (3+).    03/12/2018 Imaging    Single 1.3 x 1.4 cm RIGHT occipital metastasis.  Borderline pachymeningeal enhancement of symmetric nature could be related to the superficial metastasis or osseous disease.    03/12/2018 Procedure    Ultrasound-guided core biopsy performed of a mass within the lateral segment of the left lobe of the liver.    03/16/2018 Imaging    Right occipital lobe 13 x 16 mm. Small satellite enhancing nodule deep to the larger lesion. The larger lesion shows central necrosis and probable mild hemorrhage or calcification.  Mild dural enhancement is less impressive and could be within normal limits.  Bone marrow in the clivus and cervical spine is diffusely low signal. No focal lesion. This may be related to the patient's anemia and abnormal blood count.    03/17/2018 Cancer Staging    Staging form: Breast, AJCC 8th Edition - Clinical: Stage IV (cT2, cN0, pM1, GX, ER-, PR: Not Assessed, HER2+) - Signed by [name],  MD on 03/17/2018    03/21/2018 Echocardiogram    IMPRESSIONS 1. The left ventricle has normal systolic function with an ejection fraction of 60-65%. The cavity size was normal. Left ventricular diastolic Doppler parameters are consistent with impaired relaxation.  2. The right ventricle has normal systolic function. The cavity was normal. There is no increase in right ventricular wall thickness.  3. The mitral valve is normal in structure.  4. The tricuspid valve is normal in structure.  5. Strain imaging performed but not reported due to interpreter judgement, secondary to suboptimal image quality.     Metastasis to brain ([place])   03/17/2018 Initial Diagnosis    Metastasis to brain [facility])     Metastasis to liver ([place])   03/17/2018 Initial Diagnosis    Metastasis to liver [facility])     Metastasis to bone ([place])   03/17/2018 Initial Diagnosis    Metastasis to bone (HCC)     REVIEW OF SYSTEMS:   Constitutional: Denies fevers, chills or abnormal weight loss Eyes: Denies blurriness of vision Ears, nose, mouth, throat, and face: Denies mucositis or sore throat Respiratory: Denies cough, dyspnea or wheezes Cardiovascular: Denies palpitation, chest discomfort or lower extremity swelling Gastrointestinal:  Denies nausea, heartburn or change in bowel habits Skin: Denies abnormal skin rashes Lymphatics: Denies new lymphadenopathy or easy bruising Neurological:Denies numbness, tingling or new weaknesses Behavioral/Psych: Mood is stable, no new changes  All other systems were reviewed with the patient and are negative.  I have reviewed  the past medical history, past surgical history, social history and family history with the patient and they are unchanged from previous note.  ALLERGIES:  is allergic to nsaids.  MEDICATIONS:  Current Outpatient Medications  Medication Sig Dispense Refill  . ACCU-CHEK AVIVA PLUS test strip     . ACCU-CHEK SOFTCLIX LANCETS lancets     . albuterol (PROAIR HFA)  108 (90 Base) MCG/ACT inhaler Inhale 2 puffs into the lungs every 6 (six) hours as needed for wheezing or shortness of breath. 1 Inhaler 1  . blood glucose meter kit and supplies KIT Dispense based on patient and insurance preference. Use up to four times daily as directed. (FOR ICD-9 250.00, 250.01). 1 each 0  . dexamethasone (DECADRON) 4 MG tablet Take 1 tablet in the morning before chemo, none on the day of chemo and daily in the morning after chemo for 2 days, with food 30 tablet 1  . esomeprazole (NEXIUM) 40 MG capsule Take 1 capsule (40 mg total) by mouth 2 (two) times daily before a meal. 180 capsule 1  . fluticasone (FLOVENT HFA) 44 MCG/ACT inhaler Inhale 2 puffs into the lungs 2 (two) times daily. 3 Inhaler 1  . insulin aspart (NOVOLOG) 100 UNIT/ML FlexPen Inject 8 Units into the skin 3 (three) times daily with meals. 15 mL 0  . Insulin Detemir (LEVEMIR) 100 UNIT/ML Pen Inject 20 Units into the skin daily. 15 mL 0  . Insulin Pen Needle 31G X 5 MM MISC 1 Device by Does not apply route QID. For use with insulin pens 100 each 0  . lidocaine-prilocaine (EMLA) cream Apply to affected area once 30 g 3  . LORazepam (ATIVAN) 0.5 MG tablet Take 1 tab po 30 minutes prior to radiation or MRI 30 tablet 0  . metFORMIN (GLUCOPHAGE) 500 MG tablet Take 1 tablet (500 mg total) by mouth 2 (two) times daily with a meal. 60 tablet 1  . metoprolol succinate (TOPROL-XL) 50 MG 24 hr tablet Take 50 mg by mouth every evening.    . montelukast (SINGULAIR) 10 MG tablet TAKE 1 TABLET BY MOUTH EVERYDAY AT BEDTIME (Patient taking differently: Take 10 mg by mouth daily. ) 90 tablet 0  . Multiple Vitamin (MULTIVITAMIN WITH MINERALS) TABS tablet Take 1 tablet by mouth daily.    . ondansetron (ZOFRAN) 8 MG tablet Take 1 tablet (8 mg total) by mouth every 8 (eight) hours as needed for refractory nausea / vomiting. 30 tablet 1  . prochlorperazine (COMPAZINE) 10 MG tablet Take 1 tablet (10 mg total) by mouth every 6 (six) hours  as needed (Nausea or vomiting). 30 tablet 1  . ranitidine (ZANTAC) 300 MG tablet Take 1 tablet (300 mg total) by mouth at bedtime. (Patient taking differently: Take 300 mg by mouth at bedtime as needed for heartburn. ) 90 tablet 1  . triamcinolone (NASACORT ALLERGY 24HR) 55 MCG/ACT AERO nasal inhaler Place 2 sprays into the nose daily.     No current facility-administered medications for this visit.     PHYSICAL EXAMINATION: ECOG PERFORMANCE STATUS: 1 - Symptomatic but completely ambulatory  Vitals:   03/21/18 1112  BP: (!) 126/51  Pulse: (!) 113  Resp: 18  Temp: 98.4 F (36.9 C)  SpO2: 99%   Filed Weights   03/21/18 1112  Weight: 155 lb 9.6 oz (70.6 kg)    GENERAL:alert, no distress and comfortable SKIN: skin color, texture, turgor are normal, no rashes or significant lesions EYES: normal, Conjunctiva are pink and non-injected, sclera  clear OROPHARYNX:no exudate, no erythema and lips, buccal mucosa, and tongue normal  NECK: supple, thyroid normal size, non-tender, without nodularity LYMPH:  no palpable lymphadenopathy in the cervical, axillary or inguinal LUNGS: clear to auscultation and percussion with normal breathing effort HEART: regular rate & rhythm and no murmurs and no lower extremity edema ABDOMEN:abdomen soft, non-tender and normal bowel sounds Musculoskeletal:no cyanosis of digits and no clubbing  NEURO: alert & oriented x 3 with fluent speech, no focal motor/sensory deficits  LABORATORY DATA:  I have reviewed the data as listed    Component Value Date/Time   NA 140 03/21/2018 1206   K 4.9 03/21/2018 1206   CL 101 03/21/2018 1206   CO2 26 03/21/2018 1206   GLUCOSE 179 (H) 03/21/2018 1206   BUN 9 03/21/2018 1206   CREATININE 0.83 03/21/2018 1206   CALCIUM 10.0 03/21/2018 1206   PROT 7.6 03/21/2018 1206   ALBUMIN 3.5 03/21/2018 1206   AST 15 03/21/2018 1206   ALT 10 03/21/2018 1206   ALKPHOS 114 03/21/2018 1206   BILITOT 0.6 03/21/2018 1206   GFRNONAA  >60 03/21/2018 1206   GFRAA >60 03/21/2018 1206    No results found for: SPEP, UPEP  Lab Results  Component Value Date   WBC 6.7 03/21/2018   NEUTROABS 2.5 03/21/2018   HGB 9.2 (L) 03/21/2018   HCT 30.0 (L) 03/21/2018   MCV 87.5 03/21/2018   PLT 91 (L) 03/21/2018      Chemistry      Component Value Date/Time   NA 140 03/21/2018 1206   K 4.9 03/21/2018 1206   CL 101 03/21/2018 1206   CO2 26 03/21/2018 1206   BUN 9 03/21/2018 1206   CREATININE 0.83 03/21/2018 1206      Component Value Date/Time   CALCIUM 10.0 03/21/2018 1206   ALKPHOS 114 03/21/2018 1206   AST 15 03/21/2018 1206   ALT 10 03/21/2018 1206   BILITOT 0.6 03/21/2018 1206       RADIOGRAPHIC STUDIES: I have personally reviewed the radiological images as listed and agreed with the findings in the report. Ct Chest W Contrast  Result Date: 03/11/2018 CLINICAL DATA:  Weight loss in thrombocytopenia. EXAM: CT CHEST, ABDOMEN, AND PELVIS WITH CONTRAST TECHNIQUE: Multidetector CT imaging of the chest, abdomen and pelvis was performed following the standard protocol during bolus administration of intravenous contrast. CONTRAST:  132m OMNIPAQUE IOHEXOL 300 MG/ML SOLN, 374mOMNIPAQUE IOHEXOL 300 MG/ML SOLN COMPARISON:  None. FINDINGS: CT CHEST FINDINGS Cardiovascular: The heart size is normal. No substantial pericardial effusion. Atherosclerotic calcification is noted in the wall of the thoracic aorta. Mediastinum/Nodes: 13 mm short axis precarinal lymph node mildly enlarged. 13 mm densely calcified subcarinal lymph node is associated with calcified nodal tissue in the right hilum. No left hilar lymphadenopathy. 9 mm short axis left axillary lymph node is associated with small additional left axillary and subpectoral nodes. No right hilar adenopathy. Lungs/Pleura: The central tracheobronchial airways are patent. 2.7 x 2.2 cm central right upper lobe nodule identified on 56/4. Numerous additional pulmonary nodules are identified  bilaterally ranging in size from several mm up to 10 mm in the right upper lobe. These nodules have a slight upper lung predominance. Index 7 mm right lower lobe nodule identified on 64/4. No pleural effusion. Calcified granuloma noted posterior right costophrenic sulcus. Musculoskeletal: No worrisome lytic or sclerotic osseous abnormality. Masslike asymmetry of left breast tissue identified in the medial inferior quadrant of the left breast (40/2) measuring 2.6 x 2.3 cm.  Coronal imaging suggests that this lesion has spiculated margins (22/5). CT ABDOMEN PELVIS FINDINGS Hepatobiliary: Multiple (approximately 10) ill-defined liver lesions are concerning for metastases. Dominant lesions are a 3.6 cm lesion in the right liver (54/2) and a 3.8 cm lesion identified lateral segment left liver (58/2). Other lesions range in size from 8 mm up to 2.6 cm. There is no evidence for gallstones, gallbladder wall thickening, or pericholecystic fluid. No intrahepatic or extrahepatic biliary dilation. Pancreas: No focal mass lesion. No dilatation of the main duct. No intraparenchymal cyst. No peripancreatic edema. Spleen: Calcified granulomata. Adrenals/Urinary Tract: No adrenal nodule or mass. Kidneys unremarkable. Central sinus cysts noted in the left kidney. No evidence for hydroureter. The urinary bladder appears normal for the degree of distention. Stomach/Bowel: Stomach is unremarkable. No gastric wall thickening. No evidence of outlet obstruction. Duodenum is normally positioned as is the ligament of Treitz. No small bowel wall thickening. No small bowel dilatation. The terminal ileum is normal. The appendix is not visualized, but there is no edema or inflammation in the region of the cecum. No gross colonic mass. No colonic wall thickening. Vascular/Lymphatic: There is abdominal aortic atherosclerosis without aneurysm. There is no gastrohepatic or hepatoduodenal ligament lymphadenopathy. No intraperitoneal or retroperitoneal  lymphadenopathy. No pelvic sidewall lymphadenopathy. Reproductive: The uterus is unremarkable.  There is no adnexal mass. Other: No intraperitoneal free fluid. Musculoskeletal: No worrisome lytic or sclerotic osseous abnormality. IMPRESSION: 1. 2.6 x 2.3 cm spiculated mass identified inferomedial quadrant of the left breast. Lesion appears to retract the overlying skin. Mammographic correlation recommended. 2. Small lymph nodes in the left axillar ill-defined and suspicious for metastatic disease. 3. Multiple bilateral pulmonary nodules measuring up to 2.7 cm diameter. These probably represent metastatic disease. Given the dominant size of the central right upper lobe lesion, synchronous lung primary can not be completely excluded. 4. Multiple ill-defined liver lesions compatible with metastatic disease. Dominant liver metastases measure up to almost 4 cm. 5.  Aortic Atherosclerois (ICD10-170.0) Electronically Signed   By: Misty Stanley M.D.   On: 03/11/2018 18:30   Mr Jeri Cos ZO Contrast  Result Date: 03/17/2018 CLINICAL DATA:  Secondary malignant neoplasm of brain. SRS treatment planning. EXAM: MRI HEAD WITHOUT AND WITH CONTRAST TECHNIQUE: Multiplanar, multiecho pulse sequences of the brain and surrounding structures were obtained without and with intravenous contrast. CONTRAST:  52m MULTIHANCE GADOBENATE DIMEGLUMINE 529 MG/ML IV SOLN COMPARISON:  MRI head 03/12/2018 FINDINGS: Brain: Solitary metastatic deposit in the right occipital lobe measures 16 x 13 mm. This measures slightly larger attributed to thinner sections on the current study. Small enhancing nodule is present immediately deep to the largest lesion. The lesions show central necrosis and is peripherally located. Punctate foci of mineralization likely hemorrhage or calcification within the lesion. Mild adjacent white matter edema. No other enhancing mass lesion identified. Mild meningeal enhancement is less impressive compared to the prior study.  Ventricle size normal. No midline shift. Negative for acute infarct. Mild chronic ischemic changes in the white matter. Vascular: Normal arterial flow voids Skull and upper cervical spine: Cervical spine bone marrow diffusely low signal intensity on T1. No focal lesions identified. Clivus also shows low signal bone marrow. Sinuses/Orbits: Normal orbit moderate mucosal edema paranasal sinuses none. Other: Solitary metastatic deposit IMPRESSION: Right occipital lobe 13 x 16 mm. Small satellite enhancing nodule deep to the larger lesion. The larger lesion shows central necrosis and probable mild hemorrhage or calcification. Mild dural enhancement is less impressive and could be within normal limits. Bone  marrow in the clivus and cervical spine is diffusely low signal. No focal lesion. This may be related to the patient's anemia and abnormal blood count. Electronically Signed   By: Franchot Gallo M.D.   On: 03/17/2018 09:08   Mr Jeri Cos HY Contrast  Result Date: 03/12/2018 CLINICAL DATA:  Metastatic breast cancer.  Gait disturbance. EXAM: MRI HEAD WITHOUT AND WITH CONTRAST TECHNIQUE: Multiplanar, multiecho pulse sequences of the brain and surrounding structures were obtained without and with intravenous contrast. CONTRAST:  Gadavist 7 mL. COMPARISON:  None. FINDINGS: Brain: 13 x 14 x 13 mm metastasis, RIGHT lateral occipital lobe, mild surrounding edema. No midline shift. No other definite metastatic lesions. No acute stroke, hemorrhage hydrocephalus. Slight premature for age white matter disease. Other than the necrotic metastasis, no abnormal enhancement of the brain. Borderline pachymeningeal enhancement of a symmetric nature, could be related to the superficial metastasis. No leptomeningeal enhancement. Vascular: Normal flow voids. Skull and upper cervical spine: Diffusely abnormal marrow in the clivus and upper cervical region, concerning for metastatic disease. Sinuses/Orbits: No significant sinus disease.   Negative orbits. Other: None. IMPRESSION: Single 1.3 x 1.4 cm RIGHT occipital metastasis. Borderline pachymeningeal enhancement of symmetric nature could be related to the superficial metastasis or osseous disease. A call has been placed to the ordering provider. Electronically Signed   By: Staci Righter M.D.   On: 03/12/2018 13:28   Ct Abdomen Pelvis W Contrast  Result Date: 03/11/2018 CLINICAL DATA:  Weight loss in thrombocytopenia. EXAM: CT CHEST, ABDOMEN, AND PELVIS WITH CONTRAST TECHNIQUE: Multidetector CT imaging of the chest, abdomen and pelvis was performed following the standard protocol during bolus administration of intravenous contrast. CONTRAST:  114m OMNIPAQUE IOHEXOL 300 MG/ML SOLN, 323mOMNIPAQUE IOHEXOL 300 MG/ML SOLN COMPARISON:  None. FINDINGS: CT CHEST FINDINGS Cardiovascular: The heart size is normal. No substantial pericardial effusion. Atherosclerotic calcification is noted in the wall of the thoracic aorta. Mediastinum/Nodes: 13 mm short axis precarinal lymph node mildly enlarged. 13 mm densely calcified subcarinal lymph node is associated with calcified nodal tissue in the right hilum. No left hilar lymphadenopathy. 9 mm short axis left axillary lymph node is associated with small additional left axillary and subpectoral nodes. No right hilar adenopathy. Lungs/Pleura: The central tracheobronchial airways are patent. 2.7 x 2.2 cm central right upper lobe nodule identified on 56/4. Numerous additional pulmonary nodules are identified bilaterally ranging in size from several mm up to 10 mm in the right upper lobe. These nodules have a slight upper lung predominance. Index 7 mm right lower lobe nodule identified on 64/4. No pleural effusion. Calcified granuloma noted posterior right costophrenic sulcus. Musculoskeletal: No worrisome lytic or sclerotic osseous abnormality. Masslike asymmetry of left breast tissue identified in the medial inferior quadrant of the left breast (40/2) measuring  2.6 x 2.3 cm. Coronal imaging suggests that this lesion has spiculated margins (22/5). CT ABDOMEN PELVIS FINDINGS Hepatobiliary: Multiple (approximately 10) ill-defined liver lesions are concerning for metastases. Dominant lesions are a 3.6 cm lesion in the right liver (54/2) and a 3.8 cm lesion identified lateral segment left liver (58/2). Other lesions range in size from 8 mm up to 2.6 cm. There is no evidence for gallstones, gallbladder wall thickening, or pericholecystic fluid. No intrahepatic or extrahepatic biliary dilation. Pancreas: No focal mass lesion. No dilatation of the main duct. No intraparenchymal cyst. No peripancreatic edema. Spleen: Calcified granulomata. Adrenals/Urinary Tract: No adrenal nodule or mass. Kidneys unremarkable. Central sinus cysts noted in the left kidney. No evidence for  hydroureter. The urinary bladder appears normal for the degree of distention. Stomach/Bowel: Stomach is unremarkable. No gastric wall thickening. No evidence of outlet obstruction. Duodenum is normally positioned as is the ligament of Treitz. No small bowel wall thickening. No small bowel dilatation. The terminal ileum is normal. The appendix is not visualized, but there is no edema or inflammation in the region of the cecum. No gross colonic mass. No colonic wall thickening. Vascular/Lymphatic: There is abdominal aortic atherosclerosis without aneurysm. There is no gastrohepatic or hepatoduodenal ligament lymphadenopathy. No intraperitoneal or retroperitoneal lymphadenopathy. No pelvic sidewall lymphadenopathy. Reproductive: The uterus is unremarkable.  There is no adnexal mass. Other: No intraperitoneal free fluid. Musculoskeletal: No worrisome lytic or sclerotic osseous abnormality. IMPRESSION: 1. 2.6 x 2.3 cm spiculated mass identified inferomedial quadrant of the left breast. Lesion appears to retract the overlying skin. Mammographic correlation recommended. 2. Small lymph nodes in the left axillar  ill-defined and suspicious for metastatic disease. 3. Multiple bilateral pulmonary nodules measuring up to 2.7 cm diameter. These probably represent metastatic disease. Given the dominant size of the central right upper lobe lesion, synchronous lung primary can not be completely excluded. 4. Multiple ill-defined liver lesions compatible with metastatic disease. Dominant liver metastases measure up to almost 4 cm. 5.  Aortic Atherosclerois (ICD10-170.0) Electronically Signed   By: Misty Stanley M.D.   On: 03/11/2018 18:30   US Biopsy (liver)  Result Date: 03/12/2018 INDICATION: Multiple liver masses, lung lesions and left breast mass. The patient presents for biopsy of a liver lesion. EXAM: ULTRASOUND GUIDED CORE BIOPSY OF LIVER MEDICATIONS: None. ANESTHESIA/SEDATION: Fentanyl 100 mcg IV; Versed 2.0 mg IV Moderate Sedation Time:  20 minutes. The patient was continuously monitored during the procedure by the interventional radiology nurse under my direct supervision. PROCEDURE: The procedure, risks, benefits, and alternatives were explained to the patient. Questions regarding the procedure were encouraged and answered. The patient understands and consents to the procedure. A time-out was performed prior to initiating the procedure. Initial ultrasound was performed to localize liver lesions. The epigastric region was prepped with chlorhexidine in a sterile fashion, and a sterile drape was applied covering the operative field. A sterile gown and sterile gloves were used for the procedure. Local anesthesia was provided with 1% Lidocaine. Under ultrasound guidance, a 17 gauge trocar needle was advanced into a lesion within the left lobe of the liver. After confirming needle tip position, 3 coaxial 18 gauge core biopsy samples were obtained. Material was submitted in formalin. After the procedure Gel-Foam pledgets were advanced through the outer needle as the needle was retracted. COMPLICATIONS: None immediate.  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 The largest and most accessible by ultrasound was within the lateral segment of the left lobe and measures just over 3.5 cm in estimated maximal diameter. Two intact solid core biopsy samples were obtained. The third throw of the biopsy device did not yield much tissue. There were no immediate bleeding complications. IMPRESSION: Ultrasound-guided core biopsy performed of a mass within the lateral segment of the left lobe of the liver. Electronically Signed   By: Aletta Edouard M.D.   On: 03/12/2018 16:43    All questions were answered. The patient knows to call the clinic with any problems, questions or concerns. No barriers to learning was detected.  I spent 40 minutes counseling the patient face to face. The total time spent in the appointment was 55 minutes and more than 50% was on counseling and review of  test results  [name], MD 03/21/2018 4:30 PM

## 2018-03-21 NOTE — Progress Notes (Signed)
  Radiation Oncology         [phone] ________________________________  Name: Stacy Shelton MRN: [id number]  Date: 03/21/2018  DOB: 01-04-1961   SPECIAL TREATMENT PROCEDURE   3D TREATMENT PLANNING AND DOSIMETRY: The patient's radiation plan was reviewed and approved by Dr. Elenor Legato from neurosurgery and radiation oncology prior to treatment. It showed 3-dimensional radiation distributions overlaid onto the planning CT/MRI image set. [facility] for the target structures as well as the organs at risk were reviewed. The documentation of the 3D plan and dosimetry are filed in the radiation oncology EMR.   NARRATIVE: The patient was brought to the TrueBeam stereotactic radiation treatment machine and placed supine on the CT couch. The head frame was applied, and the patient was set up for stereotactic radiosurgery. Neurosurgery was present for the set-up and delivery   SIMULATION VERIFICATION: In the couch zero-angle position, the patient underwent Exactrac imaging using the Brainlab system with orthogonal KV images. These were carefully aligned and repeated to confirm treatment position for each of the isocenters. The Exactrac snap film verification was repeated at each couch angle.   SPECIAL TREATMENT PROCEDURE: The patient received stereotactic radiosurgery to the following target:  PTV1 target was treated using 4 Arcs to a prescription dose of 20 Gy. ExacTrac Snap verification was performed for each couch angle.   STEREOTACTIC TREATMENT MANAGEMENT: Following delivery, the patient was transported to nursing in stable condition and monitored for possible acute effects. Vital signs were recorded . The patient tolerated treatment without significant acute effects, and was discharged to home in stable condition.  PLAN: Follow-up in one month.   ------------------------------------------------  Jodelle Gross, MD, PhD

## 2018-03-21 NOTE — Telephone Encounter (Signed)
Gave avs and calendar ° °

## 2018-03-21 NOTE — Assessment & Plan Note (Signed)
She is not symptomatic. She will benefit from IV bisphosphonates. Due to an outstanding issue with a tooth, I recommend wait several weeks before we pursue IV bisphosphonates I will not prescribe on the first day of chemotherapy in case she might have significant side effects from either 1 of the treatment

## 2018-03-21 NOTE — Assessment & Plan Note (Signed)
She has diarrhea of unknown etiology I recommend reducing metformin to once a day If her diarrhea is persistent, she can take Imodium as needed

## 2018-03-21 NOTE — Assessment & Plan Note (Signed)
She is not symptomatic.  We will monitor her liver function carefully while on treatment

## 2018-03-21 NOTE — Progress Notes (Signed)
Stacy Shelton rested with [place] for 30 minutes following her [place] treatment.  Patient denies headache, dizziness, nausea, diplopia or ringing in the ears. Denies fatigue. Patient without complaints. Understands to avoid strenuous activity for the next 24 hours and call [phone] with needs.   BP 137/66   Pulse (!) 110   Temp 98.8 F (37.1 C)   Resp 20   LMP 10/30/2010   SpO2 100%    Stacy Shelton M. Leonie Green, BSN

## 2018-03-21 NOTE — Assessment & Plan Note (Signed)
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

## 2018-03-21 NOTE — Assessment & Plan Note (Signed)
She is scheduled for radiation treatment as planned

## 2018-03-21 NOTE — Assessment & Plan Note (Signed)
Her baseline echocardiogram is within normal limits She will be completing palliative radiation to the brain today She will get port placement next week She is ready to begin palliative chemotherapy We discussed the role of chemotherapy. The intent is for palliative.  We discussed some of the risks, benefits, side-effects of Docetaxel, Perjeta and Herceptin   Some of the short term side-effects included, though not limited to, risk of severe allergic reaction, fatigue, weight loss, pancytopenia, life-threatening infections, cardiomyopathy, need for transfusions of blood products, nausea, vomiting, change in bowel habits, loss of hair, admission to hospital for various reasons, and risks of death.   Long term side-effects are also discussed including risks of infertility, permanent damage to nerve function, chronic fatigue, and rare secondary malignancy including bone marrow disorders.   The patient is aware that the response rates discussed earlier is not guaranteed.    After a long discussion, patient made an informed decision to proceed with the prescribed plan of care.  We discussed the risk of hair loss and discussed strategies She would like to just purchase a week.  Prescription was given to her last visit Due to her young age, she does not need G-CSF support She is aware of the need for repeat echocardiogram every 3 months while on treatment We will get her baseline blood work performed today.

## 2018-03-24 ENCOUNTER — Encounter (HOSPITAL_COMMUNITY): Payer: Self-pay | Admitting: Interventional Radiology

## 2018-03-24 ENCOUNTER — Ambulatory Visit (HOSPITAL_COMMUNITY)
Admission: RE | Admit: 2018-03-24 | Discharge: 2018-03-24 | Disposition: A | Discharge status: Home / Self Care | Payer: BLUE CROSS/BLUE SHIELD | Source: Ambulatory Visit | Attending: Hematology and Oncology | Admitting: Hematology and Oncology

## 2018-03-24 DIAGNOSIS — Z794 Long term (current) use of insulin: Secondary | ICD-10-CM | POA: Insufficient documentation

## 2018-03-24 DIAGNOSIS — G4733 Obstructive sleep apnea (adult) (pediatric): Secondary | ICD-10-CM | POA: Diagnosis not present

## 2018-03-24 DIAGNOSIS — J45909 Unspecified asthma, uncomplicated: Secondary | ICD-10-CM | POA: Diagnosis not present

## 2018-03-24 DIAGNOSIS — Z886 Allergy status to analgesic agent status: Secondary | ICD-10-CM | POA: Diagnosis not present

## 2018-03-24 DIAGNOSIS — Z79899 Other long term (current) drug therapy: Secondary | ICD-10-CM | POA: Diagnosis not present

## 2018-03-24 DIAGNOSIS — R739 Hyperglycemia, unspecified: Secondary | ICD-10-CM | POA: Diagnosis not present

## 2018-03-24 DIAGNOSIS — C50919 Malignant neoplasm of unspecified site of unspecified female breast: Secondary | ICD-10-CM | POA: Diagnosis present

## 2018-03-24 HISTORY — PX: IR IMAGING GUIDED PORT INSERTION: IMG5740

## 2018-03-24 LAB — BASIC METABOLIC PANEL
Anion gap: 12 (ref 5–15)
BUN: 11 mg/dL (ref 6–20)
CO2: 26 mmol/L (ref 22–32)
Calcium: 9.6 mg/dL (ref 8.9–10.3)
Chloride: 102 mmol/L (ref 98–111)
Creatinine, Ser: 0.75 mg/dL (ref 0.44–1.00)
GFR calc Af Amer: >60 mL/min (ref >60)
GFR calc non Af Amer: >60 mL/min (ref >60)
Glucose, Bld: 113 mg/dL — ABNORMAL HIGH (ref 70–99)
Potassium: 3.6 mmol/L (ref 3.5–5.1)
Sodium: 140 mmol/L (ref 135–145)

## 2018-03-24 LAB — CBC
HCT: 29.8 % — ABNORMAL LOW (ref 36.0–46.0)
Hemoglobin: 8.9 g/dL — ABNORMAL LOW (ref 12.0–15.0)
MCH: 25.9 pg — ABNORMAL LOW (ref 26.0–34.0)
MCHC: 29.9 g/dL — ABNORMAL LOW (ref 30.0–36.0)
MCV: 86.6 fL (ref 80.0–100.0)
Platelets: 133 10*3/uL — ABNORMAL LOW (ref 150–400)
RBC: 3.44 MIL/uL — ABNORMAL LOW (ref 3.87–5.11)
RDW: 21.9 % — ABNORMAL HIGH (ref 11.5–15.5)
WBC: 5.4 10*3/uL (ref 4.0–10.5)
nRBC: 1.1 % — ABNORMAL HIGH (ref 0.0–0.2)

## 2018-03-24 LAB — PROTIME-INR
INR: 1.06
Prothrombin Time: 13.8 seconds (ref 11.4–15.2)

## 2018-03-24 MED ORDER — SODIUM CHLORIDE 0.9 % IV SOLN: INTRAVENOUS | Status: DC

## 2018-03-24 MED ORDER — MIDAZOLAM HCL 2 MG/2ML IJ SOLN
INTRAMUSCULAR | Status: AC | PRN
  Administered 2018-03-24 (×2): 1 mg via INTRAVENOUS

## 2018-03-24 MED ORDER — FENTANYL CITRATE (PF) 100 MCG/2ML IJ SOLN
INTRAMUSCULAR | Status: AC | PRN
  Administered 2018-03-24 (×2): 50 ug via INTRAVENOUS

## 2018-03-24 MED ORDER — MIDAZOLAM HCL 2 MG/2ML IJ SOLN
INTRAMUSCULAR | Status: AC
  Filled 2018-03-24: qty 2

## 2018-03-24 MED ORDER — LIDOCAINE HCL 1 % IJ SOLN
INTRAMUSCULAR | Status: AC | PRN
  Administered 2018-03-24: 15 mL

## 2018-03-24 MED ORDER — LIDOCAINE-EPINEPHRINE (PF) 1 %-1:200000 IJ SOLN
INTRAMUSCULAR | Status: AC
  Filled 2018-03-24: qty 30

## 2018-03-24 MED ORDER — CEFAZOLIN SODIUM-DEXTROSE 2-4 GM/100ML-% IV SOLN
2.0000 g | INTRAVENOUS | Status: AC
  Administered 2018-03-24: 2 g via INTRAVENOUS

## 2018-03-24 MED ORDER — HEPARIN SOD (PORK) LOCK FLUSH 100 UNIT/ML IV SOLN
INTRAVENOUS | Status: AC
  Filled 2018-03-24: qty 5

## 2018-03-24 MED ORDER — CEFAZOLIN SODIUM-DEXTROSE 2-4 GM/100ML-% IV SOLN
INTRAVENOUS | Status: AC
  Filled 2018-03-24: qty 100

## 2018-03-24 MED ORDER — FENTANYL CITRATE (PF) 100 MCG/2ML IJ SOLN
INTRAMUSCULAR | Status: AC
  Filled 2018-03-24: qty 2

## 2018-03-24 NOTE — Procedures (Signed)
Interventional Radiology Procedure Note  Procedure: Placement of a right IJ approach single lumen PowerPort.  Tip is positioned at the superior cavoatrial junction and catheter is ready for immediate use.  Complications: No immediate Recommendations:  - Ok to shower tomorrow - Do not submerge for 7 days - Routine line care   Signed,  Saniah Schroeter K. Jadamarie Butson, MD   

## 2018-03-24 NOTE — H&P (Signed)
Chief Complaint: Patient was seen in consultation today for port placement.  Referring Physician(s): [name]  Supervising Physician: Jacqulynn Cadet  Patient Status: [facility] - Out-pt  History of Present Illness: Stacy Shelton is a 58 y.o. female with a past medical history significant for asthma, OSA on CPAP, hyperglycemia on insulin and recently diagnosed metastatic breast cancer followed by Dr. Alvy Bimler who presents to IR today for port placement. Ms. Lafave presented to [facility] ED on 03/10/18 after being seen by her PCP for palpitations x 1 week and her lab work returned with glucose >600 with no previous history of diabetes. She complained of generally feeling unwell since 11/2017 including fatigue, weight loss and cough.  CT chest/abdomen/pelvis with contrast was performed which showed a 2.6 x 2.3 cm spiculated mass identified inferomedial quadrant of the left breast, small lymph nodes in the left axilla, multiple bilateral pulmonary nodules measuring up to 2.7 cm diameter and multiple ill-defined liver lesions compatible with metastatic disease. She was also found to be anemic with hgb 6.4, plt 74,000 with (-) FOBT and received 2 units PRBCs. She was admitted for further evaluation and management.  She underwent liver lesion biopsy on 03/12/18 in IR with Dr. Kathlene Cote which showed metastatic carcinoma with lymphovascular invasion. She was discharged on 03/13/18 with close follow up planned with oncology. She has since undergone palliative brain radiation and is now planned to start palliative chemotherapy.   Patient reports she feels ok, is nervous about the procedure. She does have some palpitations occasionally that are followed by her PCP and cardiology. She continues to have a dry cough that is worse in the mornings. She took her long and short acting insulin this morning and her FSBS was 195.   No past medical history on file.  Past Surgical History:  Procedure Laterality Date  . KIDNEY  STONE SURGERY    . SINOSCOPY    . WISDOM TOOTH EXTRACTION      Allergies: Nsaids  Medications: Prior to Admission medications   Medication Sig Start Date End Date Taking? Authorizing Provider  ACCU-CHEK AVIVA PLUS test strip  03/13/18  Yes [provider]  ACCU-CHEK SOFTCLIX LANCETS lancets  03/13/18  Yes [provider]  albuterol (PROAIR HFA) 108 (90 Base) MCG/ACT inhaler Inhale 2 puffs into the lungs every 6 (six) hours as needed for wheezing or shortness of breath. 05/16/17  Yes Padgett, Rae Halsted, MD  blood glucose meter kit and supplies KIT Dispense based on patient and insurance preference. Use up to four times daily as directed. (FOR ICD-9 250.00, 250.01). 03/13/18  Yes Donne Hazel, MD  esomeprazole (NEXIUM) 40 MG capsule Take 1 capsule (40 mg total) by mouth 2 (two) times daily before a meal. 05/16/17  Yes Padgett, Rae Halsted, MD  fluticasone (FLOVENT HFA) 44 MCG/ACT inhaler Inhale 2 puffs into the lungs 2 (two) times daily. 05/16/17  Yes Padgett, Rae Halsted, MD  insulin aspart (NOVOLOG) 100 UNIT/ML FlexPen Inject 8 Units into the skin 3 (three) times daily with meals. 03/13/18  Yes Donne Hazel, MD  Insulin Detemir (LEVEMIR) 100 UNIT/ML Pen Inject 20 Units into the skin daily. 03/13/18  Yes Donne Hazel, MD  Insulin Pen Needle 31G X 5 MM MISC 1 Device by Does not apply route QID. For use with insulin pens 03/13/18  Yes Donne Hazel, MD  LORazepam (ATIVAN) 0.5 MG tablet Take 1 tab po 30 minutes prior to radiation or MRI 03/14/18  Yes Hayden Pedro, PA-C  metFORMIN (GLUCOPHAGE) 500 MG tablet Take 1 tablet (500 mg total) by mouth 2 (two) times daily with a meal. 03/17/18  Yes Gorsuch, Ni, MD  metoprolol succinate (TOPROL-XL) 50 MG 24 hr tablet Take 50 mg by mouth every evening. 03/10/18  Yes [provider]  montelukast (SINGULAIR) 10 MG tablet TAKE 1 TABLET BY MOUTH EVERYDAY AT BEDTIME Patient taking differently: Take 10 mg by  mouth daily.  08/12/17  Yes Padgett, Rae Halsted, MD  Multiple Vitamin (MULTIVITAMIN WITH MINERALS) TABS tablet Take 1 tablet by mouth daily.   Yes [provider]  ondansetron (ZOFRAN) 8 MG tablet Take 1 tablet (8 mg total) by mouth every 8 (eight) hours as needed for refractory nausea / vomiting. 03/21/18  Yes [name], MD  prochlorperazine (COMPAZINE) 10 MG tablet Take 1 tablet (10 mg total) by mouth every 6 (six) hours as needed (Nausea or vomiting). 03/21/18  Yes Gorsuch, Ni, MD  ranitidine (ZANTAC) 300 MG tablet Take 1 tablet (300 mg total) by mouth at bedtime. Patient taking differently: Take 300 mg by mouth at bedtime as needed for heartburn.  05/16/17  Yes Padgett, Rae Halsted, MD  triamcinolone (NASACORT ALLERGY 24HR) 55 MCG/ACT AERO nasal inhaler Place 2 sprays into the nose daily.   Yes [provider]  dexamethasone (DECADRON) 4 MG tablet Take 1 tablet in the morning before chemo, none on the day of chemo and daily in the morning after chemo for 2 days, with food 03/21/18   [name], MD  lidocaine-prilocaine (EMLA) cream Apply to affected area once 03/21/18   [name], MD     Family History  Problem Relation Age of Onset  . Allergic rhinitis Neg Hx   . Angioedema Neg Hx   . Asthma Neg Hx   . Atopy Neg Hx   . Eczema Neg Hx   . Immunodeficiency Neg Hx   . Urticaria Neg Hx     Social History   Socioeconomic History  . Marital status: Single    Spouse name: Not on file  . Number of children: Not on file  . Years of education: Not on file  . Highest education level: Not on file  Occupational History  . Not on file  Social Needs  . Financial resource strain: Not on file  . Food insecurity:    Worry: Not on file    Inability: Not on file  . Transportation needs:    Medical: Not on file    Non-medical: Not on file  Tobacco Use  . Smoking status: Never Smoker  . Smokeless tobacco: Never Used  Substance and Sexual Activity  . Alcohol use:  Yes    Alcohol/week: 0.0 standard drinks  . Drug use: No  . Sexual activity: Not on file  Lifestyle  . Physical activity:    Days per week: Not on file    Minutes per session: Not on file  . Stress: Not on file  Relationships  . Social connections:    Talks on phone: Not on file    Gets together: Not on file    Attends religious service: Not on file    Active member of club or organization: Not on file    Attends meetings of clubs or organizations: Not on file    Relationship status: Not on file  Other Topics Concern  . Not on file  Social History Narrative  . Not on file     Review of Systems: A 12 point ROS discussed and  pertinent positives are indicated in the HPI above.  All other systems are negative.  Review of Systems  Constitutional: Positive for fatigue and unexpected weight change. Negative for appetite change, chills and fever.  HENT:       (+) recent root canal  Respiratory: Positive for cough (dry; chronic).   Cardiovascular: Negative for chest pain.  Gastrointestinal: Negative for abdominal pain, blood in stool, diarrhea, nausea and vomiting.  Genitourinary: Negative for dysuria and hematuria.  Skin: Negative for color change.  Neurological: Negative for dizziness, syncope and headaches.  Psychiatric/Behavioral: Negative for confusion.    Vital Signs: BP (!) 109/54   Pulse 98   Temp 98.6 F (37 C) (Oral)   Resp (!) 22   Ht _0  (1.575 m)   Wt 152 lb (68.9 kg)   LMP 10/30/2010   SpO2 93%   BMI 27.80 kg/m   Physical Exam Vitals signs reviewed.  Constitutional:      General: She is not in acute distress.    Comments: Appears anxious. Two friends at bedside during exam.   HENT:     Head: Normocephalic.  Cardiovascular:     Rate and Rhythm: Normal rate and regular rhythm.  Pulmonary:     Effort: Pulmonary effort is normal.     Breath sounds: Normal breath sounds.  Abdominal:     General: There is no distension.     Palpations: Abdomen is  soft.     Tenderness: There is no abdominal tenderness.  Skin:    General: Skin is warm and dry.  Neurological:     Mental Status: She is alert and oriented to person, place, and time.  Psychiatric:        Mood and Affect: Mood normal.        Behavior: Behavior normal.        Thought Content: Thought content normal.        Judgment: Judgment normal.      MD Evaluation Airway: WNL(right upper temporary crown) Heart: WNL Abdomen: WNL Chest/ Lungs: WNL ASA  Classification: 3 Mallampati/Airway Score: Two   Imaging: Ct Chest W Contrast  Result Date: 03/11/2018 CLINICAL DATA:  Weight loss in thrombocytopenia. EXAM: CT CHEST, ABDOMEN, AND PELVIS WITH CONTRAST TECHNIQUE: Multidetector CT imaging of the chest, abdomen and pelvis was performed following the standard protocol during bolus administration of intravenous contrast. CONTRAST:  137m OMNIPAQUE IOHEXOL 300 MG/ML SOLN, 363mOMNIPAQUE IOHEXOL 300 MG/ML SOLN COMPARISON:  None. FINDINGS: CT CHEST FINDINGS Cardiovascular: The heart size is normal. No substantial pericardial effusion. Atherosclerotic calcification is noted in the wall of the thoracic aorta. Mediastinum/Nodes: 13 mm short axis precarinal lymph node mildly enlarged. 13 mm densely calcified subcarinal lymph node is associated with calcified nodal tissue in the right hilum. No left hilar lymphadenopathy. 9 mm short axis left axillary lymph node is associated with small additional left axillary and subpectoral nodes. No right hilar adenopathy. Lungs/Pleura: The central tracheobronchial airways are patent. 2.7 x 2.2 cm central right upper lobe nodule identified on 56/4. Numerous additional pulmonary nodules are identified bilaterally ranging in size from several mm up to 10 mm in the right upper lobe. These nodules have a slight upper lung predominance. Index 7 mm right lower lobe nodule identified on 64/4. No pleural effusion. Calcified granuloma noted posterior right costophrenic  sulcus. Musculoskeletal: No worrisome lytic or sclerotic osseous abnormality. Masslike asymmetry of left breast tissue identified in the medial inferior quadrant of the left breast (40/2) measuring 2.6 x 2.3  cm. Coronal imaging suggests that this lesion has spiculated margins (22/5). CT ABDOMEN PELVIS FINDINGS Hepatobiliary: Multiple (approximately 10) ill-defined liver lesions are concerning for metastases. Dominant lesions are a 3.6 cm lesion in the right liver (54/2) and a 3.8 cm lesion identified lateral segment left liver (58/2). Other lesions range in size from 8 mm up to 2.6 cm. There is no evidence for gallstones, gallbladder wall thickening, or pericholecystic fluid. No intrahepatic or extrahepatic biliary dilation. Pancreas: No focal mass lesion. No dilatation of the main duct. No intraparenchymal cyst. No peripancreatic edema. Spleen: Calcified granulomata. Adrenals/Urinary Tract: No adrenal nodule or mass. Kidneys unremarkable. Central sinus cysts noted in the left kidney. No evidence for hydroureter. The urinary bladder appears normal for the degree of distention. Stomach/Bowel: Stomach is unremarkable. No gastric wall thickening. No evidence of outlet obstruction. Duodenum is normally positioned as is the ligament of Treitz. No small bowel wall thickening. No small bowel dilatation. The terminal ileum is normal. The appendix is not visualized, but there is no edema or inflammation in the region of the cecum. No gross colonic mass. No colonic wall thickening. Vascular/Lymphatic: There is abdominal aortic atherosclerosis without aneurysm. There is no gastrohepatic or hepatoduodenal ligament lymphadenopathy. No intraperitoneal or retroperitoneal lymphadenopathy. No pelvic sidewall lymphadenopathy. Reproductive: The uterus is unremarkable.  There is no adnexal mass. Other: No intraperitoneal free fluid. Musculoskeletal: No worrisome lytic or sclerotic osseous abnormality. IMPRESSION: 1. 2.6 x 2.3 cm  spiculated mass identified inferomedial quadrant of the left breast. Lesion appears to retract the overlying skin. Mammographic correlation recommended. 2. Small lymph nodes in the left axillar ill-defined and suspicious for metastatic disease. 3. Multiple bilateral pulmonary nodules measuring up to 2.7 cm diameter. These probably represent metastatic disease. Given the dominant size of the central right upper lobe lesion, synchronous lung primary can not be completely excluded. 4. Multiple ill-defined liver lesions compatible with metastatic disease. Dominant liver metastases measure up to almost 4 cm. 5.  Aortic Atherosclerois (ICD10-170.0) Electronically Signed   By: Misty Stanley M.D.   On: 03/11/2018 18:30   Mr Jeri Cos ON Contrast  Result Date: 03/17/2018 CLINICAL DATA:  Secondary malignant neoplasm of brain. SRS treatment planning. EXAM: MRI HEAD WITHOUT AND WITH CONTRAST TECHNIQUE: Multiplanar, multiecho pulse sequences of the brain and surrounding structures were obtained without and with intravenous contrast. CONTRAST:  5m MULTIHANCE GADOBENATE DIMEGLUMINE 529 MG/ML IV SOLN COMPARISON:  MRI head 03/12/2018 FINDINGS: Brain: Solitary metastatic deposit in the right occipital lobe measures 16 x 13 mm. This measures slightly larger attributed to thinner sections on the current study. Small enhancing nodule is present immediately deep to the largest lesion. The lesions show central necrosis and is peripherally located. Punctate foci of mineralization likely hemorrhage or calcification within the lesion. Mild adjacent white matter edema. No other enhancing mass lesion identified. Mild meningeal enhancement is less impressive compared to the prior study. Ventricle size normal. No midline shift. Negative for acute infarct. Mild chronic ischemic changes in the white matter. Vascular: Normal arterial flow voids Skull and upper cervical spine: Cervical spine bone marrow diffusely low signal intensity on T1. No  focal lesions identified. Clivus also shows low signal bone marrow. Sinuses/Orbits: Normal orbit moderate mucosal edema paranasal sinuses none. Other: Solitary metastatic deposit IMPRESSION: Right occipital lobe 13 x 16 mm. Small satellite enhancing nodule deep to the larger lesion. The larger lesion shows central necrosis and probable mild hemorrhage or calcification. Mild dural enhancement is less impressive and could be within normal limits.  Bone marrow in the clivus and cervical spine is diffusely low signal. No focal lesion. This may be related to the patient's anemia and abnormal blood count. Electronically Signed   By: Franchot Gallo M.D.   On: 03/17/2018 09:08   Mr Jeri Cos EP Contrast  Result Date: 03/12/2018 CLINICAL DATA:  Metastatic breast cancer.  Gait disturbance. EXAM: MRI HEAD WITHOUT AND WITH CONTRAST TECHNIQUE: Multiplanar, multiecho pulse sequences of the brain and surrounding structures were obtained without and with intravenous contrast. CONTRAST:  Gadavist 7 mL. COMPARISON:  None. FINDINGS: Brain: 13 x 14 x 13 mm metastasis, RIGHT lateral occipital lobe, mild surrounding edema. No midline shift. No other definite metastatic lesions. No acute stroke, hemorrhage hydrocephalus. Slight premature for age white matter disease. Other than the necrotic metastasis, no abnormal enhancement of the brain. Borderline pachymeningeal enhancement of a symmetric nature, could be related to the superficial metastasis. No leptomeningeal enhancement. Vascular: Normal flow voids. Skull and upper cervical spine: Diffusely abnormal marrow in the clivus and upper cervical region, concerning for metastatic disease. Sinuses/Orbits: No significant sinus disease.  Negative orbits. Other: None. IMPRESSION: Single 1.3 x 1.4 cm RIGHT occipital metastasis. Borderline pachymeningeal enhancement of symmetric nature could be related to the superficial metastasis or osseous disease. A call has been placed to the ordering  provider. Electronically Signed   By: Staci Righter M.D.   On: 03/12/2018 13:28   Ct Abdomen Pelvis W Contrast  Result Date: 03/11/2018 CLINICAL DATA:  Weight loss in thrombocytopenia. EXAM: CT CHEST, ABDOMEN, AND PELVIS WITH CONTRAST TECHNIQUE: Multidetector CT imaging of the chest, abdomen and pelvis was performed following the standard protocol during bolus administration of intravenous contrast. CONTRAST:  184m OMNIPAQUE IOHEXOL 300 MG/ML SOLN, 399mOMNIPAQUE IOHEXOL 300 MG/ML SOLN COMPARISON:  None. FINDINGS: CT CHEST FINDINGS Cardiovascular: The heart size is normal. No substantial pericardial effusion. Atherosclerotic calcification is noted in the wall of the thoracic aorta. Mediastinum/Nodes: 13 mm short axis precarinal lymph node mildly enlarged. 13 mm densely calcified subcarinal lymph node is associated with calcified nodal tissue in the right hilum. No left hilar lymphadenopathy. 9 mm short axis left axillary lymph node is associated with small additional left axillary and subpectoral nodes. No right hilar adenopathy. Lungs/Pleura: The central tracheobronchial airways are patent. 2.7 x 2.2 cm central right upper lobe nodule identified on 56/4. Numerous additional pulmonary nodules are identified bilaterally ranging in size from several mm up to 10 mm in the right upper lobe. These nodules have a slight upper lung predominance. Index 7 mm right lower lobe nodule identified on 64/4. No pleural effusion. Calcified granuloma noted posterior right costophrenic sulcus. Musculoskeletal: No worrisome lytic or sclerotic osseous abnormality. Masslike asymmetry of left breast tissue identified in the medial inferior quadrant of the left breast (40/2) measuring 2.6 x 2.3 cm. Coronal imaging suggests that this lesion has spiculated margins (22/5). CT ABDOMEN PELVIS FINDINGS Hepatobiliary: Multiple (approximately 10) ill-defined liver lesions are concerning for metastases. Dominant lesions are a 3.6 cm lesion in  the right liver (54/2) and a 3.8 cm lesion identified lateral segment left liver (58/2). Other lesions range in size from 8 mm up to 2.6 cm. There is no evidence for gallstones, gallbladder wall thickening, or pericholecystic fluid. No intrahepatic or extrahepatic biliary dilation. Pancreas: No focal mass lesion. No dilatation of the main duct. No intraparenchymal cyst. No peripancreatic edema. Spleen: Calcified granulomata. Adrenals/Urinary Tract: No adrenal nodule or mass. Kidneys unremarkable. Central sinus cysts noted in the left kidney. No evidence  for hydroureter. The urinary bladder appears normal for the degree of distention. Stomach/Bowel: Stomach is unremarkable. No gastric wall thickening. No evidence of outlet obstruction. Duodenum is normally positioned as is the ligament of Treitz. No small bowel wall thickening. No small bowel dilatation. The terminal ileum is normal. The appendix is not visualized, but there is no edema or inflammation in the region of the cecum. No gross colonic mass. No colonic wall thickening. Vascular/Lymphatic: There is abdominal aortic atherosclerosis without aneurysm. There is no gastrohepatic or hepatoduodenal ligament lymphadenopathy. No intraperitoneal or retroperitoneal lymphadenopathy. No pelvic sidewall lymphadenopathy. Reproductive: The uterus is unremarkable.  There is no adnexal mass. Other: No intraperitoneal free fluid. Musculoskeletal: No worrisome lytic or sclerotic osseous abnormality. IMPRESSION: 1. 2.6 x 2.3 cm spiculated mass identified inferomedial quadrant of the left breast. Lesion appears to retract the overlying skin. Mammographic correlation recommended. 2. Small lymph nodes in the left axillar ill-defined and suspicious for metastatic disease. 3. Multiple bilateral pulmonary nodules measuring up to 2.7 cm diameter. These probably represent metastatic disease. Given the dominant size of the central right upper lobe lesion, synchronous lung primary can  not be completely excluded. 4. Multiple ill-defined liver lesions compatible with metastatic disease. Dominant liver metastases measure up to almost 4 cm. 5.  Aortic Atherosclerois (ICD10-170.0) Electronically Signed   By: Misty Stanley M.D.   On: 03/11/2018 18:30   US Biopsy (liver)  Result Date: 03/12/2018 INDICATION: Multiple liver masses, lung lesions and left breast mass. The patient presents for biopsy of a liver lesion. EXAM: ULTRASOUND GUIDED CORE BIOPSY OF LIVER MEDICATIONS: None. ANESTHESIA/SEDATION: Fentanyl 100 mcg IV; Versed 2.0 mg IV Moderate Sedation Time:  20 minutes. The patient was continuously monitored during the procedure by the interventional radiology nurse under my direct supervision. PROCEDURE: The procedure, risks, benefits, and alternatives were explained to the patient. Questions regarding the procedure were encouraged and answered. The patient understands and consents to the procedure. A time-out was performed prior to initiating the procedure. Initial ultrasound was performed to localize liver lesions. The epigastric region was prepped with chlorhexidine in a sterile fashion, and a sterile drape was applied covering the operative field. A sterile gown and sterile gloves were used for the procedure. Local anesthesia was provided with 1% Lidocaine. Under ultrasound guidance, a 17 gauge trocar needle was advanced into a lesion within the left lobe of the liver. After confirming needle tip position, 3 coaxial 18 gauge core biopsy samples were obtained. Material was submitted in formalin. After the procedure Gel-Foam pledgets were advanced through the outer needle as the needle was retracted. COMPLICATIONS: None immediate. 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 The largest and most accessible by ultrasound was within the lateral segment of the left lobe and measures just over 3.5 cm in estimated maximal diameter. Two intact solid core biopsy samples  were obtained. The third throw of the biopsy device did not yield much tissue. There were no immediate bleeding complications. IMPRESSION: Ultrasound-guided core biopsy performed of a mass within the lateral segment of the left lobe of the liver. Electronically Signed   By: Aletta Edouard M.D.   On: 03/12/2018 16:43    Labs:  CBC: Recent Labs    03/11/18 1235 03/12/18 0941 03/21/18 1206 03/24/18 0700  WBC 6.7 6.0 6.7 5.4  HGB 9.1* 9.6* 9.2* 8.9*  HCT 28.7* 29.8* 30.0* 29.8*  PLT 60* 62* 91* 133*    COAGS: Recent Labs    03/12/18 0941 03/24/18 0700  INR 1.07 1.06    BMP: Recent Labs    03/10/18 2207 03/11/18 0013 03/21/18 1206  NA 130* 128* 140  K 3.4* 3.4* 4.9  CL 94* 92* 101  CO2 21* 20* 26  GLUCOSE 433* 425* 179*  BUN _0 CALCIUM 9.1 9.0 10.0  CREATININE 0.68 0.67 0.83  GFRNONAA >60 >60 >60  GFRAA >60 >60 >60    LIVER FUNCTION TESTS: Recent Labs    03/10/18 2207 03/21/18 1206  BILITOT 1.2 0.6  AST 14* 15  ALT 13 10  ALKPHOS 78 114  PROT 7.1 7.6  ALBUMIN 3.7 3.5    TUMOR MARKERS: No results for input(s): AFPTM, CEA, CA199, CHROMGRNA in the last 8760 hours.  Assessment and Plan:  Patient with newly diagnosed metastatic breast cancer followed by Dr. Alvy Bimler who has completed palliative brain radiation and is now planned for palliative chemotherapy. She is not planned for any further radiation. Request has been made for port placement today in IR.   Patient has been NPO since 8 pm last night, she does not take blood thinning medications, she did take both her long and short acting insulins this morning. Afebrile, hgb 8.9, plt 133, INR 1.06, creatinine 0.75/  Risks and benefits of image guided port-a-catheter placement was discussed with the patient including, but not limited to bleeding, infection, pneumothorax, or fibrin sheath development and need for additional procedures.  All of the patient's questions were answered, patient is agreeable to  proceed.  Consent signed and in chart.  Thank you for this interesting consult.  I greatly enjoyed meeting LUCENDIA LEARD and look forward to participating in their care.  A copy of this report was sent to the requesting provider on this date.  Electronically Signed: Joaquim Nam, PA-C 03/24/2018, 7:46 AM   I spent a total of  30 Minutes  in face to face in clinical consultation, greater than 50% of which was counseling/coordinating care for port placement.

## 2018-03-24 NOTE — Discharge Instructions (Signed)
Implanted Port Insertion, Care After °This sheet gives you information about how to care for yourself after your procedure. Your health care provider may also give you more specific instructions. If you have problems or questions, contact your health care provider. °What can I expect after the procedure? °After the procedure, it is common to have: °· Discomfort at the port insertion site. °· Bruising on the skin over the port. This should improve over 3-4 days. °Follow these instructions at home: °Port care °· After your port is placed, you will get a manufacturer's information card. The card has information about your port. Keep this card with you at all times. °· Take care of the port as told by your health care provider. Ask your health care provider if you or a family member can get training for taking care of the port at home. A home health care nurse may also take care of the port. °· Make sure to remember what type of port you have. °Incision care ° °  ° °· Follow instructions from your health care provider about how to take care of your port insertion site. Make sure you: °? Wash your hands with soap and water before and after you change your bandage (dressing). If soap and water are not available, use hand sanitizer. °? Change your dressing as told by your health care provider. °? Leave stitches (sutures), skin glue, or adhesive strips in place. These skin closures may need to stay in place for 2 weeks or longer. If adhesive strip edges start to loosen and curl up, you may trim the loose edges. Do not remove adhesive strips completely unless your health care provider tells you to do that. °· Check your port insertion site every day for signs of infection. Check for: °? Redness, swelling, or pain. °? Fluid or blood. °? Warmth. °? Pus or a bad smell. °Activity °· Return to your normal activities as told by your health care provider. Ask your health care provider what activities are safe for you. °· Do not  lift anything that is heavier than 10 lb (4.5 kg), or the limit that you are told, until your health care provider says that it is safe. °General instructions °· Take over-the-counter and prescription medicines only as told by your health care provider. °· Do not take baths, swim, or use a hot tub until your health care provider approves. Ask your health care provider if you may take showers. You may only be allowed to take sponge baths. °· Do not drive for 24 hours if you were given a sedative during your procedure. °· Wear a medical alert bracelet in case of an emergency. This will tell any health care providers that you have a port. °· Keep all follow-up visits as told by your health care provider. This is important. °Contact a health care provider if: °· You cannot flush your port with saline as directed, or you cannot draw blood from the port. °· You have a fever or chills. °· You have redness, swelling, or pain around your port insertion site. °· You have fluid or blood coming from your port insertion site. °· Your port insertion site feels warm to the touch. °· You have pus or a bad smell coming from the port insertion site. °Get help right away if: °· You have chest pain or shortness of breath. °· You have bleeding from your port that you cannot control. °Summary °· Take care of the port as told by your health   care provider. Keep the manufacturer's information card with you at all times. °· Change your dressing as told by your health care provider. °· Contact a health care provider if you have a fever or chills or if you have redness, swelling, or pain around your port insertion site. °· Keep all follow-up visits as told by your health care provider. °This information is not intended to replace advice given to you by your health care provider. Make sure you discuss any questions you have with your health care provider. °Document Released: 11/05/2012 Document Revised: 08/13/2017 Document Reviewed:  08/13/2017 °Elsevier Interactive Patient Education © 2019 [employer]. ° °Moderate Conscious Sedation, Adult, Care After °These instructions provide you with information about caring for yourself after your procedure. Your health care provider may also give you more specific instructions. Your treatment has been planned according to current medical practices, but problems sometimes occur. Call your health care provider if you have any problems or questions after your procedure. °What can I expect after the procedure? °After your procedure, it is common: °· To feel sleepy for several hours. °· To feel clumsy and have poor balance for several hours. °· To have poor judgment for several hours. °· To vomit if you eat too soon. °Follow these instructions at home: °For at least 24 hours after the procedure: ° °· Do not: °? Participate in activities where you could fall or become injured. °? Drive. °? Use heavy machinery. °? Drink alcohol. °? Take sleeping pills or medicines that cause drowsiness. °? Make important decisions or sign legal documents. °? Take care of children on your own. °· Rest. °Eating and drinking °· Follow the diet recommended by your health care provider. °· If you vomit: °? Drink water, juice, or soup when you can drink without vomiting. °? Make sure you have little or no nausea before eating solid foods. °General instructions °· Have a responsible adult stay with you until you are awake and alert. °· Take over-the-counter and prescription medicines only as told by your health care provider. °· If you smoke, do not smoke without supervision. °· Keep all follow-up visits as told by your health care provider. This is important. °Contact a health care provider if: °· You keep feeling nauseous or you keep vomiting. °· You feel light-headed. °· You develop a rash. °· You have a fever. °Get help right away if: °· You have trouble breathing. °This information is not intended to replace advice given to you  by your health care provider. Make sure you discuss any questions you have with your health care provider. °Document Released: 11/05/2012 Document Revised: 06/20/2015 Document Reviewed: 05/07/2015 °Elsevier Interactive Patient Education © 2019 [employer]vier Inc. ° °

## 2018-03-26 ENCOUNTER — Encounter: Payer: Self-pay | Admitting: *Deleted

## 2018-03-26 ENCOUNTER — Inpatient Hospital Stay: Payer: BLUE CROSS/BLUE SHIELD

## 2018-03-26 VITALS — BP 130/74 | HR 102 | Temp 99.8°F | Resp 20

## 2018-03-26 DIAGNOSIS — C50919 Malignant neoplasm of unspecified site of unspecified female breast: Secondary | ICD-10-CM

## 2018-03-26 DIAGNOSIS — C7931 Secondary malignant neoplasm of brain: Secondary | ICD-10-CM

## 2018-03-26 DIAGNOSIS — C7951 Secondary malignant neoplasm of bone: Secondary | ICD-10-CM

## 2018-03-26 DIAGNOSIS — C787 Secondary malignant neoplasm of liver and intrahepatic bile duct: Secondary | ICD-10-CM

## 2018-03-26 DIAGNOSIS — Z5112 Encounter for antineoplastic immunotherapy: Secondary | ICD-10-CM | POA: Diagnosis not present

## 2018-03-26 MED ORDER — DEXAMETHASONE SODIUM PHOSPHATE 10 MG/ML IJ SOLN
INTRAMUSCULAR | Status: AC
  Filled 2018-03-26: qty 1

## 2018-03-26 MED ORDER — TRASTUZUMAB CHEMO 150 MG IV SOLR
600.0000 mg | Freq: Once | INTRAVENOUS | Status: AC
  Administered 2018-03-26: 600 mg via INTRAVENOUS
  Filled 2018-03-26: qty 28.57

## 2018-03-26 MED ORDER — HEPARIN SOD (PORK) LOCK FLUSH 100 UNIT/ML IV SOLN
500.0000 [IU] | Freq: Once | INTRAVENOUS | Status: AC | PRN
  Administered 2018-03-26: 500 [IU]
  Filled 2018-03-26: qty 5

## 2018-03-26 MED ORDER — SODIUM CHLORIDE 0.9 % IV SOLN
75.0000 mg/m2 | Freq: Once | INTRAVENOUS | Status: AC
  Administered 2018-03-26: 130 mg via INTRAVENOUS
  Filled 2018-03-26: qty 13

## 2018-03-26 MED ORDER — DIPHENHYDRAMINE HCL 25 MG PO CAPS
ORAL_CAPSULE | ORAL | Status: AC
  Filled 2018-03-26: qty 2

## 2018-03-26 MED ORDER — DEXAMETHASONE SODIUM PHOSPHATE 10 MG/ML IJ SOLN
10.0000 mg | Freq: Once | INTRAMUSCULAR | Status: AC
  Administered 2018-03-26: 10 mg via INTRAVENOUS

## 2018-03-26 MED ORDER — ACETAMINOPHEN 325 MG PO TABS
ORAL_TABLET | ORAL | Status: AC
  Filled 2018-03-26: qty 2

## 2018-03-26 MED ORDER — ACETAMINOPHEN 325 MG PO TABS
650.0000 mg | ORAL_TABLET | Freq: Once | ORAL | Status: AC
  Administered 2018-03-26: 650 mg via ORAL

## 2018-03-26 MED ORDER — SODIUM CHLORIDE 0.9 % IV SOLN
840.0000 mg | Freq: Once | INTRAVENOUS | Status: AC
  Administered 2018-03-26: 840 mg via INTRAVENOUS
  Filled 2018-03-26: qty 28

## 2018-03-26 MED ORDER — SODIUM CHLORIDE 0.9% FLUSH
10.0000 mL | INTRAVENOUS | Status: DC | PRN
  Administered 2018-03-26: 10 mL
  Filled 2018-03-26: qty 10

## 2018-03-26 MED ORDER — SODIUM CHLORIDE 0.9 % IV SOLN
Freq: Once | INTRAVENOUS | Status: AC
  Administered 2018-03-26: 08:00:00 via INTRAVENOUS
  Filled 2018-03-26: qty 250

## 2018-03-26 MED ORDER — DIPHENHYDRAMINE HCL 25 MG PO CAPS
50.0000 mg | ORAL_CAPSULE | Freq: Once | ORAL | Status: AC
  Administered 2018-03-26: 50 mg via ORAL

## 2018-03-26 NOTE — Progress Notes (Signed)
Pt with temp of 100.8. Sandi Mealy, PA notified. Tylenol 650mg  PO given. Per Tanner, PA, ok to start taxotere immediately after giving tylenol. Will continue to follow.   Dr. Alvy Bimler notified of pt temp. Per Dr. Alvy Bimler, ok to continue with infusion as this was an expected side effect. Per Dr. Alvy Bimler, ok to continue with taxotere infusion. Pt to limit amount of tylenol taken at home due to elevated liver enzymes. Pt instructed to monitor temp at home after completion of infusion.  Will continue to follow.

## 2018-03-26 NOTE — Patient Instructions (Signed)
[facility] Discharge Instructions for Patients Receiving Chemotherapy  Today you received the following chemotherapy agents: trastuzumab (Herceptin), pertuzumab (Perjeta), docetaxel (Taxotere).   To help prevent nausea and vomiting after your treatment, we encourage you to take your nausea medication as prescribed by your physician.   If you develop nausea and vomiting that is not controlled by your nausea medication, call the clinic.   BELOW ARE SYMPTOMS THAT SHOULD BE REPORTED IMMEDIATELY:  *FEVER GREATER THAN 100.5 F  *CHILLS WITH OR WITHOUT FEVER  NAUSEA AND VOMITING THAT IS NOT CONTROLLED WITH YOUR NAUSEA MEDICATION  *UNUSUAL SHORTNESS OF BREATH  *UNUSUAL BRUISING OR BLEEDING  TENDERNESS IN MOUTH AND THROAT WITH OR WITHOUT PRESENCE OF ULCERS  *URINARY PROBLEMS  *BOWEL PROBLEMS  UNUSUAL RASH Items with * indicate a potential emergency and should be followed up as soon as possible.  Feel free to call the clinic should you have any questions or concerns. The clinic phone number is [phone].  Please show the [facility] at check-in to the Emergency Department and triage nurse.  Trastuzumab injection for infusion What is this medicine? TRASTUZUMAB (tras TOO zoo mab) is a monoclonal antibody. It is used to treat breast cancer and stomach cancer. This medicine may be used for other purposes; ask your health care provider or pharmacist if you have questions. COMMON BRAND NAME(S): Herceptin, Calla Kicks, OGIVRI What should I tell my health care provider before I take this medicine? They need to know if you have any of these conditions: -heart disease -heart failure -lung or breathing disease, like asthma -an unusual or allergic reaction to trastuzumab, benzyl alcohol, or other medications, foods, dyes, or preservatives -pregnant or trying to get pregnant -breast-feeding How should I use this medicine? This drug is given as an infusion into a vein. It  is administered in a hospital or clinic by a specially trained health care professional. Talk to your pediatrician regarding the use of this medicine in children. This medicine is not approved for use in children. Overdosage: If you think you have taken too much of this medicine contact a poison control center or emergency room at once. NOTE: This medicine is only for you. Do not share this medicine with others. What if I miss a dose? It is important not to miss a dose. Call your doctor or health care professional if you are unable to keep an appointment. What may interact with this medicine? This medicine may interact with the following medications: -certain types of chemotherapy, such as daunorubicin, doxorubicin, epirubicin, and idarubicin This list may not describe all possible interactions. Give your health care provider a list of all the medicines, herbs, non-prescription drugs, or dietary supplements you use. Also tell them if you smoke, drink alcohol, or use illegal drugs. Some items may interact with your medicine. What should I watch for while using this medicine? Visit your doctor for checks on your progress. Report any side effects. Continue your course of treatment even though you feel ill unless your doctor tells you to stop. Call your doctor or health care professional for advice if you get a fever, chills or sore throat, or other symptoms of a cold or flu. Do not treat yourself. Try to avoid being around people who are sick. You may experience fever, chills and shaking during your first infusion. These effects are usually mild and can be treated with other medicines. Report any side effects during the infusion to your health care professional. Fever and chills usually  do not happen with later infusions. Do not become pregnant while taking this medicine or for 7 months after stopping it. Women should inform their doctor if they wish to become pregnant or think they might be pregnant.  Women of child-bearing potential will need to have a negative pregnancy test before starting this medicine. There is a potential for serious side effects to an unborn child. Talk to your health care professional or pharmacist for more information. Do not breast-feed an infant while taking this medicine or for 7 months after stopping it. Women must use effective birth control with this medicine. What side effects may I notice from receiving this medicine? Side effects that you should report to your doctor or health care professional as soon as possible: -allergic reactions like skin rash, itching or hives, swelling of the face, lips, or tongue -chest pain or palpitations -cough -dizziness -feeling faint or lightheaded, falls -fever -general ill feeling or flu-like symptoms -signs of worsening heart failure like breathing problems; swelling in your legs and feet -unusually weak or tired Side effects that usually do not require medical attention (report to your doctor or health care professional if they continue or are bothersome): -bone pain -changes in taste -diarrhea -joint pain -nausea/vomiting -weight loss This list may not describe all possible side effects. Call your doctor for medical advice about side effects. You may report side effects to FDA at 1-800-FDA-1088. Where should I keep my medicine? This drug is given in a hospital or clinic and will not be stored at home. NOTE: This sheet is a summary. It may not cover all possible information. If you have questions about this medicine, talk to your doctor, pharmacist, or health care provider.  2019 Elsevier/Gold Standard (2016-01-10 14:37:52)  Pertuzumab injection What is this medicine? PERTUZUMAB (per TOOZ ue mab) is a monoclonal antibody. It is used to treat breast cancer. This medicine may be used for other purposes; ask your health care provider or pharmacist if you have questions. COMMON BRAND NAME(S): PERJETA What should I  tell my health care provider before I take this medicine? They need to know if you have any of these conditions: -heart disease -heart failure -high blood pressure -history of irregular heart beat -recent or ongoing radiation therapy -an unusual or allergic reaction to pertuzumab, other medicines, foods, dyes, or preservatives -pregnant or trying to get pregnant -breast-feeding How should I use this medicine? This medicine is for infusion into a vein. It is given by a health care professional in a hospital or clinic setting. Talk to your pediatrician regarding the use of this medicine in children. Special care may be needed. Overdosage: If you think you have taken too much of this medicine contact a poison control center or emergency room at once. NOTE: This medicine is only for you. Do not share this medicine with others. What if I miss a dose? It is important not to miss your dose. Call your doctor or health care professional if you are unable to keep an appointment. What may interact with this medicine? Interactions are not expected. Give your health care provider a list of all the medicines, herbs, non-prescription drugs, or dietary supplements you use. Also tell them if you smoke, drink alcohol, or use illegal drugs. Some items may interact with your medicine. This list may not describe all possible interactions. Give your health care provider a list of all the medicines, herbs, non-prescription drugs, or dietary supplements you use. Also tell them if you smoke, drink alcohol,  or use illegal drugs. Some items may interact with your medicine. What should I watch for while using this medicine? Your condition will be monitored carefully while you are receiving this medicine. Report any side effects. Continue your course of treatment even though you feel ill unless your doctor tells you to stop. Do not become pregnant while taking this medicine or for 7 months after stopping it. Women should  inform their doctor if they wish to become pregnant or think they might be pregnant. Women of child-bearing potential will need to have a negative pregnancy test before starting this medicine. There is a potential for serious side effects to an unborn child. Talk to your health care professional or pharmacist for more information. Do not breast-feed an infant while taking this medicine or for 7 months after stopping it. Women must use effective birth control with this medicine. Call your doctor or health care professional for advice if you get a fever, chills or sore throat, or other symptoms of a cold or flu. Do not treat yourself. Try to avoid being around people who are sick. You may experience fever, chills, and headache during the infusion. Report any side effects during the infusion to your health care professional. What side effects may I notice from receiving this medicine? Side effects that you should report to your doctor or health care professional as soon as possible: -breathing problems -chest pain or palpitations -dizziness -feeling faint or lightheaded -fever or chills -skin rash, itching or hives -sore throat -swelling of the face, lips, or tongue -swelling of the legs or ankles -unusually weak or tired Side effects that usually do not require medical attention (report to your doctor or health care professional if they continue or are bothersome): -diarrhea -hair loss -nausea, vomiting -tiredness This list may not describe all possible side effects. Call your doctor for medical advice about side effects. You may report side effects to FDA at 1-800-FDA-1088. Where should I keep my medicine? This drug is given in a hospital or clinic and will not be stored at home. NOTE: This sheet is a summary. It may not cover all possible information. If you have questions about this medicine, talk to your doctor, pharmacist, or health care provider.  2019 Elsevier/Gold Standard (2015-02-17  12:08:50)  Docetaxel injection What is this medicine? DOCETAXEL (doe se TAX el) is a chemotherapy drug. It targets fast dividing cells, like cancer cells, and causes these cells to die. This medicine is used to treat many types of cancers like breast cancer, certain stomach cancers, head and neck cancer, lung cancer, and prostate cancer. This medicine may be used for other purposes; ask your health care provider or pharmacist if you have questions. COMMON BRAND NAME(S): Docefrez, Taxotere What should I tell my health care provider before I take this medicine? They need to know if you have any of these conditions: -infection (especially a virus infection such as chickenpox, cold sores, or herpes) -liver disease -low blood counts, like low white cell, platelet, or red cell counts -an unusual or allergic reaction to docetaxel, polysorbate 80, other chemotherapy agents, other medicines, foods, dyes, or preservatives -pregnant or trying to get pregnant -breast-feeding How should I use this medicine? This drug is given as an infusion into a vein. It is administered in a hospital or clinic by a specially trained health care professional. Talk to your pediatrician regarding the use of this medicine in children. Special care may be needed. Overdosage: If you think you have taken too  much of this medicine contact a poison control center or emergency room at once. NOTE: This medicine is only for you. Do not share this medicine with others. What if I miss a dose? It is important not to miss your dose. Call your doctor or health care professional if you are unable to keep an appointment. What may interact with this medicine? -cyclosporine -erythromycin -ketoconazole -medicines to increase blood counts like filgrastim, pegfilgrastim, sargramostim -vaccines Talk to your doctor or health care professional before taking any of these  medicines: -acetaminophen -aspirin -ibuprofen -ketoprofen -naproxen This list may not describe all possible interactions. Give your health care provider a list of all the medicines, herbs, non-prescription drugs, or dietary supplements you use. Also tell them if you smoke, drink alcohol, or use illegal drugs. Some items may interact with your medicine. What should I watch for while using this medicine? Your condition will be monitored carefully while you are receiving this medicine. You will need important blood work done while you are taking this medicine. This drug may make you feel generally unwell. This is not uncommon, as chemotherapy can affect healthy cells as well as cancer cells. Report any side effects. Continue your course of treatment even though you feel ill unless your doctor tells you to stop. In some cases, you may be given additional medicines to help with side effects. Follow all directions for their use. Call your doctor or health care professional for advice if you get a fever, chills or sore throat, or other symptoms of a cold or flu. Do not treat yourself. This drug decreases your body's ability to fight infections. Try to avoid being around people who are sick. This medicine may increase your risk to bruise or bleed. Call your doctor or health care professional if you notice any unusual bleeding. This medicine may contain alcohol in the product. You may get drowsy or dizzy. Do not drive, use machinery, or do anything that needs mental alertness until you know how this medicine affects you. Do not stand or sit up quickly, especially if you are an older patient. This reduces the risk of dizzy or fainting spells. Avoid alcoholic drinks. Do not become pregnant while taking this medicine or for 6 months after stopping it. Women should inform their doctor if they wish to become pregnant or think they might be pregnant. Men should not father a child while taking this medicine and for 3  months after stopping it. There is a potential for serious side effects to an unborn child. Talk to your health care professional or pharmacist for more information. Do not breast-feed an infant while taking this medicine or for 2 weeks after stopping it. This may interfere with the ability to father a child. You should talk to your doctor or health care professional if you are concerned about your fertility. What side effects may I notice from receiving this medicine? Side effects that you should report to your doctor or health care professional as soon as possible: -allergic reactions like skin rash, itching or hives, swelling of the face, lips, or tongue -low blood counts - This drug may decrease the number of white blood cells, red blood cells and platelets. You may be at increased risk for infections and bleeding. -signs of infection - fever or chills, cough, sore throat, pain or difficulty passing urine -signs of decreased platelets or bleeding - bruising, pinpoint red spots on the skin, black, tarry stools, nosebleeds -signs of decreased red blood cells - unusually weak  or tired, fainting spells, lightheadedness -breathing problems -fast or irregular heartbeat -low blood pressure -mouth sores -nausea and vomiting -pain, swelling, redness or irritation at the injection site -pain, tingling, numbness in the hands or feet -swelling of the ankle, feet, hands -weight gain Side effects that usually do not require medical attention (report to your doctor or health care professional if they continue or are bothersome): -bone pain -complete hair loss including hair on your head, underarms, pubic hair, eyebrows, and eyelashes -diarrhea -excessive tearing -changes in the color of fingernails -loosening of the fingernails -nausea -muscle pain -red flush to skin -sweating -weak or tired This list may not describe all possible side effects. Call your doctor for medical advice about side  effects. You may report side effects to FDA at 1-800-FDA-1088. Where should I keep my medicine? This drug is given in a hospital or clinic and will not be stored at home. NOTE: This sheet is a summary. It may not cover all possible information. If you have questions about this medicine, talk to your doctor, pharmacist, or health care provider.  2019 Elsevier/Gold Standard (2017-02-11 12:07:21)

## 2018-03-27 ENCOUNTER — Telehealth: Payer: Self-pay

## 2018-03-27 NOTE — Telephone Encounter (Signed)
Called to follow up after first treatment yesterday. She is doing well and feels good. She is running a low grade fever of 99.6, she has taken tylenol 650 mg and is using ice packs under her arms. Instructed to limit tylenol intake and to call the office if needed. She verbalized understanding.

## 2018-03-27 NOTE — Telephone Encounter (Signed)
-----   Message from Teodoro Spray, RN sent at 03/26/2018  9:32 AM EST ----- Regarding: Dr. Ilean China time chemo followup Dr. Alvy Bimler Pt. First time THP. Chemo followup call. Thank you.

## 2018-03-27 NOTE — Telephone Encounter (Signed)
Faxed medical release for dental work to Dr. Posey Pronto. Received confirmation of fax received.

## 2018-04-02 ENCOUNTER — Telehealth: Payer: Self-pay

## 2018-04-02 NOTE — Telephone Encounter (Signed)
-----   Message from [name], MD sent at 04/02/2018  7:36 AM EST ----- Regarding: how is she doing How is she doing? What is the plan with her dentist? Blood sugar OK?

## 2018-04-02 NOTE — Telephone Encounter (Signed)
Called and given below message. She verbalized understanding. She is doing well, with no side effects. Blood glucose 111 this morning. Since stopping the steroids glucose has been < 150. Her dentist appt is 4/1 for crown and root canal. She waiting until then because of money issues.

## 2018-04-03 NOTE — Progress Notes (Signed)
STD claim successfully faxed to [facility] at [phone]. Mailed copy to patient address on file.

## 2018-04-07 ENCOUNTER — Other Ambulatory Visit: Payer: Self-pay

## 2018-04-07 ENCOUNTER — Encounter (HOSPITAL_COMMUNITY): Payer: Self-pay

## 2018-04-07 ENCOUNTER — Inpatient Hospital Stay (HOSPITAL_BASED_OUTPATIENT_CLINIC_OR_DEPARTMENT_OTHER): Payer: BLUE CROSS/BLUE SHIELD | Admitting: Medical

## 2018-04-07 ENCOUNTER — Inpatient Hospital Stay (HOSPITAL_COMMUNITY)
Admission: EM | Admit: 2018-04-07 | Discharge: 2018-04-16 | DRG: 871 | Disposition: A | Discharge status: Home / Self Care | Payer: BLUE CROSS/BLUE SHIELD | Attending: Internal Medicine | Admitting: Internal Medicine

## 2018-04-07 ENCOUNTER — Emergency Department (HOSPITAL_COMMUNITY): Payer: BLUE CROSS/BLUE SHIELD

## 2018-04-07 ENCOUNTER — Other Ambulatory Visit: Payer: Self-pay | Admitting: *Deleted

## 2018-04-07 ENCOUNTER — Telehealth: Payer: Self-pay | Admitting: *Deleted

## 2018-04-07 ENCOUNTER — Inpatient Hospital Stay: Payer: BLUE CROSS/BLUE SHIELD

## 2018-04-07 VITALS — BP 108/56 | HR 120 | Temp 99.4°F | Resp 16 | Ht 62.0 in

## 2018-04-07 DIAGNOSIS — E861 Hypovolemia: Secondary | ICD-10-CM | POA: Diagnosis present

## 2018-04-07 DIAGNOSIS — Z7951 Long term (current) use of inhaled steroids: Secondary | ICD-10-CM

## 2018-04-07 DIAGNOSIS — C7802 Secondary malignant neoplasm of left lung: Secondary | ICD-10-CM | POA: Diagnosis present

## 2018-04-07 DIAGNOSIS — C7801 Secondary malignant neoplasm of right lung: Secondary | ICD-10-CM | POA: Diagnosis present

## 2018-04-07 DIAGNOSIS — Z171 Estrogen receptor negative status [ER-]: Secondary | ICD-10-CM

## 2018-04-07 DIAGNOSIS — A419 Sepsis, unspecified organism: Principal | ICD-10-CM

## 2018-04-07 DIAGNOSIS — C7951 Secondary malignant neoplasm of bone: Secondary | ICD-10-CM | POA: Diagnosis present

## 2018-04-07 DIAGNOSIS — R112 Nausea with vomiting, unspecified: Secondary | ICD-10-CM

## 2018-04-07 DIAGNOSIS — N390 Urinary tract infection, site not specified: Secondary | ICD-10-CM | POA: Diagnosis present

## 2018-04-07 DIAGNOSIS — J9690 Respiratory failure, unspecified, unspecified whether with hypoxia or hypercapnia: Secondary | ICD-10-CM

## 2018-04-07 DIAGNOSIS — R197 Diarrhea, unspecified: Secondary | ICD-10-CM

## 2018-04-07 DIAGNOSIS — D61818 Other pancytopenia: Secondary | ICD-10-CM

## 2018-04-07 DIAGNOSIS — D638 Anemia in other chronic diseases classified elsewhere: Secondary | ICD-10-CM | POA: Diagnosis present

## 2018-04-07 DIAGNOSIS — C50919 Malignant neoplasm of unspecified site of unspecified female breast: Secondary | ICD-10-CM

## 2018-04-07 DIAGNOSIS — R6520 Severe sepsis without septic shock: Secondary | ICD-10-CM | POA: Diagnosis not present

## 2018-04-07 DIAGNOSIS — R509 Fever, unspecified: Secondary | ICD-10-CM

## 2018-04-07 DIAGNOSIS — C50912 Malignant neoplasm of unspecified site of left female breast: Secondary | ICD-10-CM | POA: Diagnosis present

## 2018-04-07 DIAGNOSIS — D6189 Other specified aplastic anemias and other bone marrow failure syndromes: Secondary | ICD-10-CM | POA: Diagnosis not present

## 2018-04-07 DIAGNOSIS — N3000 Acute cystitis without hematuria: Secondary | ICD-10-CM

## 2018-04-07 DIAGNOSIS — R0602 Shortness of breath: Secondary | ICD-10-CM

## 2018-04-07 DIAGNOSIS — T451X5A Adverse effect of antineoplastic and immunosuppressive drugs, initial encounter: Secondary | ICD-10-CM | POA: Diagnosis present

## 2018-04-07 DIAGNOSIS — N3001 Acute cystitis with hematuria: Secondary | ICD-10-CM

## 2018-04-07 DIAGNOSIS — C787 Secondary malignant neoplasm of liver and intrahepatic bile duct: Secondary | ICD-10-CM | POA: Diagnosis present

## 2018-04-07 DIAGNOSIS — E1165 Type 2 diabetes mellitus with hyperglycemia: Secondary | ICD-10-CM

## 2018-04-07 DIAGNOSIS — I5032 Chronic diastolic (congestive) heart failure: Secondary | ICD-10-CM | POA: Diagnosis present

## 2018-04-07 DIAGNOSIS — D649 Anemia, unspecified: Secondary | ICD-10-CM

## 2018-04-07 DIAGNOSIS — D61810 Antineoplastic chemotherapy induced pancytopenia: Secondary | ICD-10-CM | POA: Diagnosis present

## 2018-04-07 DIAGNOSIS — R6521 Severe sepsis with septic shock: Secondary | ICD-10-CM | POA: Diagnosis present

## 2018-04-07 DIAGNOSIS — J189 Pneumonia, unspecified organism: Secondary | ICD-10-CM

## 2018-04-07 DIAGNOSIS — I110 Hypertensive heart disease with heart failure: Secondary | ICD-10-CM | POA: Diagnosis present

## 2018-04-07 DIAGNOSIS — E440 Moderate protein-calorie malnutrition: Secondary | ICD-10-CM | POA: Diagnosis present

## 2018-04-07 DIAGNOSIS — E876 Hypokalemia: Secondary | ICD-10-CM | POA: Diagnosis present

## 2018-04-07 DIAGNOSIS — B961 Klebsiella pneumoniae [K. pneumoniae] as the cause of diseases classified elsewhere: Secondary | ICD-10-CM | POA: Diagnosis present

## 2018-04-07 DIAGNOSIS — I471 Supraventricular tachycardia: Secondary | ICD-10-CM | POA: Diagnosis present

## 2018-04-07 DIAGNOSIS — J9601 Acute respiratory failure with hypoxia: Secondary | ICD-10-CM | POA: Diagnosis present

## 2018-04-07 DIAGNOSIS — J181 Lobar pneumonia, unspecified organism: Secondary | ICD-10-CM | POA: Diagnosis present

## 2018-04-07 DIAGNOSIS — Z79899 Other long term (current) drug therapy: Secondary | ICD-10-CM

## 2018-04-07 DIAGNOSIS — G4733 Obstructive sleep apnea (adult) (pediatric): Secondary | ICD-10-CM | POA: Diagnosis present

## 2018-04-07 DIAGNOSIS — C7931 Secondary malignant neoplasm of brain: Secondary | ICD-10-CM | POA: Diagnosis present

## 2018-04-07 DIAGNOSIS — Z9221 Personal history of antineoplastic chemotherapy: Secondary | ICD-10-CM

## 2018-04-07 DIAGNOSIS — Z6827 Body mass index (BMI) 27.0-27.9, adult: Secondary | ICD-10-CM

## 2018-04-07 DIAGNOSIS — F40240 Claustrophobia: Secondary | ICD-10-CM | POA: Diagnosis present

## 2018-04-07 DIAGNOSIS — Z794 Long term (current) use of insulin: Secondary | ICD-10-CM

## 2018-04-07 LAB — COMPREHENSIVE METABOLIC PANEL
ALBUMIN: 3.7 g/dL (ref 3.5–5.0)
ALT: 13 U/L (ref 0–44)
ALT: 12 U/L (ref 0–44)
AST: 15 U/L (ref 15–41)
AST: 11 U/L — ABNORMAL LOW (ref 15–41)
Albumin: 2.6 g/dL — ABNORMAL LOW (ref 3.5–5.0)
Alkaline Phosphatase: 92 U/L (ref 38–126)
Alkaline Phosphatase: 64 U/L (ref 38–126)
Anion gap: 15 (ref 5–15)
Anion gap: 9 (ref 5–15)
BUN: 10 mg/dL (ref 6–20)
BUN: 11 mg/dL (ref 6–20)
CO2: 22 mmol/L (ref 22–32)
CO2: 18 mmol/L — ABNORMAL LOW (ref 22–32)
CREATININE: 0.82 mg/dL (ref 0.44–1.00)
Calcium: 8.7 mg/dL — ABNORMAL LOW (ref 8.9–10.3)
Calcium: 7.3 mg/dL — ABNORMAL LOW (ref 8.9–10.3)
Chloride: 95 mmol/L — ABNORMAL LOW (ref 98–111)
Chloride: 106 mmol/L (ref 98–111)
Creatinine, Ser: 0.78 mg/dL (ref 0.44–1.00)
GFR calc Af Amer: >60 mL/min (ref >60)
GFR calc Af Amer: >60 mL/min (ref >60)
GFR calc non Af Amer: >60 mL/min (ref >60)
GFR calc non Af Amer: >60 mL/min (ref >60)
GLUCOSE: 223 mg/dL — AB (ref 70–99)
Glucose, Bld: 258 mg/dL — ABNORMAL HIGH (ref 70–99)
Potassium: 2.7 mmol/L — CL (ref 3.5–5.1)
Potassium: 2.9 mmol/L — ABNORMAL LOW (ref 3.5–5.1)
Sodium: 132 mmol/L — ABNORMAL LOW (ref 135–145)
Sodium: 133 mmol/L — ABNORMAL LOW (ref 135–145)
Total Bilirubin: 1.5 mg/dL — ABNORMAL HIGH (ref 0.3–1.2)
Total Bilirubin: 1.8 mg/dL — ABNORMAL HIGH (ref 0.3–1.2)
Total Protein: 7.4 g/dL (ref 6.5–8.1)
Total Protein: 5.7 g/dL — ABNORMAL LOW (ref 6.5–8.1)

## 2018-04-07 LAB — URINALYSIS, COMPLETE (UACMP) WITH MICROSCOPIC
Bilirubin Urine: NEGATIVE
Glucose, UA: NEGATIVE mg/dL
Ketones, ur: 20 mg/dL — AB
NITRITE: POSITIVE — AB
Protein, ur: 100 mg/dL — AB
Specific Gravity, Urine: 1.015 (ref 1.005–1.030)
pH: 6 (ref 5.0–8.0)

## 2018-04-07 LAB — CBC WITH DIFFERENTIAL (CANCER CENTER ONLY)
Abs Immature Granulocytes: 1.4 10*3/uL — ABNORMAL HIGH (ref 0.00–0.07)
BAND NEUTROPHILS: 16 %
BASOS PCT: 5 %
Basophils Absolute: 0.6 10*3/uL — ABNORMAL HIGH (ref 0.0–0.1)
Blasts: 6 %
EOS ABS: 0 10*3/uL (ref 0.0–0.5)
Eosinophils Relative: 0 %
HCT: 22.9 % — ABNORMAL LOW (ref 36.0–46.0)
Hemoglobin: 7.2 g/dL — ABNORMAL LOW (ref 12.0–15.0)
Lymphocytes Relative: 22 %
Lymphs Abs: 2.4 10*3/uL (ref 0.7–4.0)
MCH: 26.2 pg (ref 26.0–34.0)
MCHC: 31.4 g/dL (ref 30.0–36.0)
MCV: 83.3 fL (ref 80.0–100.0)
MONO ABS: 1.8 10*3/uL — AB (ref 0.1–1.0)
Metamyelocytes Relative: 5 %
Monocytes Relative: 16 %
Myelocytes: 8 %
Neutro Abs: 4.2 10*3/uL (ref 1.7–17.7)
Neutrophils Relative %: 22 %
Platelet Count: 151 10*3/uL (ref 150–400)
RBC: 2.75 MIL/uL — ABNORMAL LOW (ref 3.87–5.11)
RDW: 25.4 % — ABNORMAL HIGH (ref 11.5–15.5)
WBC Count: 11.1 10*3/uL — ABNORMAL HIGH (ref 4.0–10.5)
nRBC: 4.6 % — ABNORMAL HIGH (ref 0.0–0.2)
nRBC: 6 /100 WBC — ABNORMAL HIGH

## 2018-04-07 LAB — SAMPLE TO BLOOD BANK

## 2018-04-07 LAB — BASIC METABOLIC PANEL
Anion gap: 12 (ref 5–15)
BUN: 12 mg/dL (ref 6–20)
CO2: 17 mmol/L — ABNORMAL LOW (ref 22–32)
Calcium: 7.5 mg/dL — ABNORMAL LOW (ref 8.9–10.3)
Chloride: 106 mmol/L (ref 98–111)
Creatinine, Ser: 0.83 mg/dL (ref 0.44–1.00)
GFR calc Af Amer: >60 mL/min (ref >60)
GFR calc non Af Amer: >60 mL/min (ref >60)
Glucose, Bld: 262 mg/dL — ABNORMAL HIGH (ref 70–99)
Potassium: 3 mmol/L — ABNORMAL LOW (ref 3.5–5.1)
Sodium: 135 mmol/L (ref 135–145)

## 2018-04-07 LAB — APTT: aPTT: 45 seconds — ABNORMAL HIGH (ref 24–36)

## 2018-04-07 LAB — PREPARE RBC (CROSSMATCH)

## 2018-04-07 LAB — PROTIME-INR
INR: 1.6 — ABNORMAL HIGH (ref 0.8–1.2)
Prothrombin Time: 18.8 seconds — ABNORMAL HIGH (ref 11.4–15.2)

## 2018-04-07 LAB — MRSA PCR SCREENING: MRSA by PCR: NEGATIVE

## 2018-04-07 LAB — INFLUENZA PANEL BY PCR (TYPE A & B)
INFLBPCR: NEGATIVE
Influenza A By PCR: NEGATIVE

## 2018-04-07 LAB — PHOSPHORUS: Phosphorus: 2 mg/dL — ABNORMAL LOW (ref 2.5–4.6)

## 2018-04-07 LAB — GLUCOSE, CAPILLARY
GLUCOSE-CAPILLARY: 259 mg/dL — AB (ref 70–99)
Glucose-Capillary: 215 mg/dL — ABNORMAL HIGH (ref 70–99)

## 2018-04-07 LAB — MAGNESIUM
Magnesium: 1.3 mg/dL — ABNORMAL LOW (ref 1.7–2.4)
Magnesium: 1.9 mg/dL (ref 1.7–2.4)

## 2018-04-07 LAB — LACTIC ACID, PLASMA
Lactic Acid, Venous: 1.9 mmol/L (ref 0.5–1.9)
Lactic Acid, Venous: 1 mmol/L (ref 0.5–1.9)

## 2018-04-07 LAB — PROCALCITONIN: Procalcitonin: 14.25 ng/mL

## 2018-04-07 MED ORDER — SODIUM CHLORIDE 0.9 % IV SOLN
2.0000 g | Freq: Three times a day (TID) | INTRAVENOUS | Status: DC
  Administered 2018-04-08 – 2018-04-09 (×6): 2 g via INTRAVENOUS
  Filled 2018-04-07 (×7): qty 2

## 2018-04-07 MED ORDER — ONDANSETRON HCL 4 MG/2ML IJ SOLN
4.0000 mg | Freq: Four times a day (QID) | INTRAMUSCULAR | Status: DC | PRN
  Administered 2018-04-08: 4 mg via INTRAVENOUS
  Filled 2018-04-07: qty 2

## 2018-04-07 MED ORDER — ONDANSETRON HCL 4 MG/2ML IJ SOLN
4.0000 mg | Freq: Once | INTRAMUSCULAR | Status: AC
  Administered 2018-04-07: 4 mg via INTRAVENOUS
  Filled 2018-04-07: qty 2

## 2018-04-07 MED ORDER — SODIUM CHLORIDE 0.9% IV SOLUTION: 250.0000 mL | Freq: Once | INTRAVENOUS | Status: DC

## 2018-04-07 MED ORDER — ONDANSETRON HCL 4 MG/2ML IJ SOLN
INTRAMUSCULAR | Status: AC
  Filled 2018-04-07: qty 2

## 2018-04-07 MED ORDER — SODIUM CHLORIDE 0.9 % IV SOLN
4.0000 g | Freq: Once | INTRAVENOUS | Status: DC
  Filled 2018-04-07: qty 8

## 2018-04-07 MED ORDER — SODIUM CHLORIDE 0.9 % IV BOLUS (SEPSIS)
1000.0000 mL | Freq: Once | INTRAVENOUS | Status: AC
  Administered 2018-04-07: 1000 mL via INTRAVENOUS

## 2018-04-07 MED ORDER — K PHOS MONO-SOD PHOS DI & MONO 155-852-130 MG PO TABS
250.0000 mg | ORAL_TABLET | Freq: Two times a day (BID) | ORAL | Status: DC
  Administered 2018-04-07 – 2018-04-16 (×15): 250 mg via ORAL
  Filled 2018-04-07 (×18): qty 1

## 2018-04-07 MED ORDER — CHLORHEXIDINE GLUCONATE CLOTH 2 % EX PADS
6.0000 | MEDICATED_PAD | Freq: Every day | CUTANEOUS | Status: DC
  Administered 2018-04-07 – 2018-04-08 (×2): 6 via TOPICAL

## 2018-04-07 MED ORDER — INSULIN DETEMIR 100 UNIT/ML ~~LOC~~ SOLN
15.0000 [IU] | Freq: Every day | SUBCUTANEOUS | Status: DC
  Administered 2018-04-08: 15 [IU] via SUBCUTANEOUS
  Filled 2018-04-07 (×2): qty 0.15

## 2018-04-07 MED ORDER — POTASSIUM CHLORIDE CRYS ER 20 MEQ PO TBCR
40.0000 meq | EXTENDED_RELEASE_TABLET | Freq: Once | ORAL | Status: AC
  Administered 2018-04-07: 40 meq via ORAL
  Filled 2018-04-07: qty 2

## 2018-04-07 MED ORDER — ACETAMINOPHEN 325 MG PO TABS
650.0000 mg | ORAL_TABLET | Freq: Once | ORAL | Status: AC
  Administered 2018-04-07: 650 mg via ORAL
  Filled 2018-04-07: qty 2

## 2018-04-07 MED ORDER — ACETAMINOPHEN 325 MG PO TABS
650.0000 mg | ORAL_TABLET | Freq: Four times a day (QID) | ORAL | Status: DC | PRN
  Administered 2018-04-08 (×2): 650 mg via ORAL
  Filled 2018-04-07 (×2): qty 2

## 2018-04-07 MED ORDER — METRONIDAZOLE IN NACL 5-0.79 MG/ML-% IV SOLN
500.0000 mg | Freq: Once | INTRAVENOUS | Status: AC
  Administered 2018-04-07: 500 mg via INTRAVENOUS
  Filled 2018-04-07 (×2): qty 100

## 2018-04-07 MED ORDER — METRONIDAZOLE IN NACL 5-0.79 MG/ML-% IV SOLN
500.0000 mg | Freq: Three times a day (TID) | INTRAVENOUS | Status: DC
  Administered 2018-04-08 – 2018-04-09 (×6): 500 mg via INTRAVENOUS
  Filled 2018-04-07 (×6): qty 100

## 2018-04-07 MED ORDER — HYDROCODONE-ACETAMINOPHEN 5-325 MG PO TABS: 1.0000 | ORAL_TABLET | ORAL | Status: DC | PRN

## 2018-04-07 MED ORDER — ONDANSETRON HCL 4 MG PO TABS: 4.0000 mg | ORAL_TABLET | Freq: Four times a day (QID) | ORAL | Status: DC | PRN

## 2018-04-07 MED ORDER — INSULIN ASPART 100 UNIT/ML ~~LOC~~ SOLN
0.0000 [IU] | SUBCUTANEOUS | Status: DC
  Administered 2018-04-07: 3 [IU] via SUBCUTANEOUS
  Administered 2018-04-07: 5 [IU] via SUBCUTANEOUS
  Administered 2018-04-08 (×2): 1 [IU] via SUBCUTANEOUS
  Administered 2018-04-08 – 2018-04-11 (×7): 2 [IU] via SUBCUTANEOUS
  Administered 2018-04-11: 1 [IU] via SUBCUTANEOUS
  Administered 2018-04-11: 2 [IU] via SUBCUTANEOUS
  Administered 2018-04-11 (×3): 1 [IU] via SUBCUTANEOUS
  Administered 2018-04-12: 3 [IU] via SUBCUTANEOUS
  Administered 2018-04-12: 5 [IU] via SUBCUTANEOUS
  Administered 2018-04-12: 3 [IU] via SUBCUTANEOUS
  Administered 2018-04-12: 2 [IU] via SUBCUTANEOUS
  Administered 2018-04-12 (×3): 3 [IU] via SUBCUTANEOUS
  Administered 2018-04-13 (×3): 1 [IU] via SUBCUTANEOUS
  Administered 2018-04-13: 5 [IU] via SUBCUTANEOUS
  Administered 2018-04-13: 2 [IU] via SUBCUTANEOUS
  Administered 2018-04-13: 3 [IU] via SUBCUTANEOUS
  Administered 2018-04-14: 1 [IU] via SUBCUTANEOUS
  Administered 2018-04-14 (×3): 2 [IU] via SUBCUTANEOUS
  Administered 2018-04-15: 1 [IU] via SUBCUTANEOUS
  Administered 2018-04-15: 2 [IU] via SUBCUTANEOUS
  Administered 2018-04-15 – 2018-04-16 (×4): 3 [IU] via SUBCUTANEOUS
  Administered 2018-04-16 (×2): 1 [IU] via SUBCUTANEOUS
  Administered 2018-04-16: 2 [IU] via SUBCUTANEOUS

## 2018-04-07 MED ORDER — DIPHENOXYLATE-ATROPINE 2.5-0.025 MG PO TABS
ORAL_TABLET | ORAL | Status: AC
  Filled 2018-04-07: qty 2

## 2018-04-07 MED ORDER — ACETAMINOPHEN 650 MG RE SUPP: 650.0000 mg | Freq: Four times a day (QID) | RECTAL | Status: DC | PRN

## 2018-04-07 MED ORDER — POTASSIUM CHLORIDE 10 MEQ/100ML IV SOLN
10.0000 meq | INTRAVENOUS | Status: AC
  Administered 2018-04-07 – 2018-04-08 (×4): 10 meq via INTRAVENOUS
  Filled 2018-04-07 (×4): qty 100

## 2018-04-07 MED ORDER — PHENYLEPHRINE HCL-NACL 10-0.9 MG/250ML-% IV SOLN
0.0000 ug/min | INTRAVENOUS | Status: DC
  Administered 2018-04-07: 20 ug/min via INTRAVENOUS
  Filled 2018-04-07 (×2): qty 250

## 2018-04-07 MED ORDER — BOOST / RESOURCE BREEZE PO LIQD CUSTOM
1.0000 | Freq: Three times a day (TID) | ORAL | Status: DC
  Administered 2018-04-10: 1 via ORAL
  Administered 2018-04-11 – 2018-04-12 (×2): 237 mL via ORAL
  Administered 2018-04-12: 1 via ORAL
  Administered 2018-04-13 (×2): 237 mL via ORAL

## 2018-04-07 MED ORDER — VANCOMYCIN HCL IN DEXTROSE 1-5 GM/200ML-% IV SOLN: 1000.0000 mg | Freq: Once | INTRAVENOUS | Status: DC

## 2018-04-07 MED ORDER — VANCOMYCIN HCL 10 G IV SOLR
1500.0000 mg | Freq: Once | INTRAVENOUS | Status: AC
  Administered 2018-04-07: 1500 mg via INTRAVENOUS
  Filled 2018-04-07: qty 1500

## 2018-04-07 MED ORDER — LEVOFLOXACIN IN D5W 500 MG/100ML IV SOLN
500.0000 mg | Freq: Once | INTRAVENOUS | Status: AC
  Administered 2018-04-07: 500 mg via INTRAVENOUS
  Filled 2018-04-07: qty 100

## 2018-04-07 MED ORDER — VANCOMYCIN HCL 10 G IV SOLR
1250.0000 mg | INTRAVENOUS | Status: DC
  Administered 2018-04-08 – 2018-04-09 (×2): 1250 mg via INTRAVENOUS
  Filled 2018-04-07 (×3): qty 1250

## 2018-04-07 MED ORDER — SODIUM CHLORIDE 0.9 % IV SOLN: 10.0000 mL/h | Freq: Once | INTRAVENOUS | Status: DC

## 2018-04-07 MED ORDER — SODIUM CHLORIDE 0.9 % IV BOLUS (SEPSIS)
250.0000 mL | Freq: Once | INTRAVENOUS | Status: AC
  Administered 2018-04-07: 250 mL via INTRAVENOUS

## 2018-04-07 MED ORDER — SODIUM CHLORIDE 0.9 % IV SOLN
2.0000 g | Freq: Once | INTRAVENOUS | Status: AC
  Administered 2018-04-07: 2 g via INTRAVENOUS
  Filled 2018-04-07: qty 2

## 2018-04-07 MED ORDER — SODIUM CHLORIDE 0.9% IV SOLUTION: Freq: Once | INTRAVENOUS | Status: DC

## 2018-04-07 MED ORDER — ONDANSETRON HCL 4 MG/2ML IJ SOLN
4.0000 mg | Freq: Once | INTRAMUSCULAR | Status: AC
  Administered 2018-04-07: 4 mg via INTRAVENOUS

## 2018-04-07 MED ORDER — ACETAMINOPHEN 325 MG PO TABS
ORAL_TABLET | ORAL | Status: AC
  Filled 2018-04-07: qty 2

## 2018-04-07 MED ORDER — SODIUM CHLORIDE 0.9 % IV BOLUS
1000.0000 mL | Freq: Once | INTRAVENOUS | Status: AC
  Administered 2018-04-07: 1000 mL via INTRAVENOUS

## 2018-04-07 MED ORDER — DIPHENOXYLATE-ATROPINE 2.5-0.025 MG PO TABS
1.0000 | ORAL_TABLET | Freq: Once | ORAL | Status: AC
  Administered 2018-04-07: 1 via ORAL

## 2018-04-07 MED ORDER — SODIUM CHLORIDE 0.9 % IV SOLN
INTRAVENOUS | Status: DC
  Administered 2018-04-07: 22:00:00 via INTRAVENOUS

## 2018-04-07 MED ORDER — SODIUM CHLORIDE 0.9 % IV SOLN
Freq: Once | INTRAVENOUS | Status: DC
  Administered 2018-04-07: 15:00:00 via INTRAVENOUS
  Filled 2018-04-07: qty 1000

## 2018-04-07 MED ORDER — MONTELUKAST SODIUM 10 MG PO TABS
10.0000 mg | ORAL_TABLET | Freq: Every day | ORAL | Status: DC
  Administered 2018-04-07 – 2018-04-08 (×2): 10 mg via ORAL
  Filled 2018-04-07 (×3): qty 1

## 2018-04-07 MED ORDER — LEVALBUTEROL HCL 0.63 MG/3ML IN NEBU
0.6300 mg | INHALATION_SOLUTION | Freq: Four times a day (QID) | RESPIRATORY_TRACT | Status: DC | PRN
  Administered 2018-04-08: 0.63 mg via RESPIRATORY_TRACT
  Filled 2018-04-07: qty 3

## 2018-04-07 MED ORDER — ACETAMINOPHEN 325 MG PO TABS
650.0000 mg | ORAL_TABLET | Freq: Four times a day (QID) | ORAL | Status: DC | PRN
  Administered 2018-04-07: 650 mg via ORAL

## 2018-04-07 NOTE — Progress Notes (Signed)
A consult was received from an ED physician for vancomycin and cefepime per pharmacy dosing.  The patient's profile has been reviewed for ht/wt/allergies/indication/available labs.    She was given levaquin 500mg  at the cancer center at 1330.  A one time order has been placed for vancomcyin 1500 mg, cefepime 2 gm, flagyl 500 IV q8 per MD.    Further antibiotics/pharmacy consults should be ordered by admitting physician if indicated.                       Thank you,  Eudelia Bunch, Pharm.D [phone] 04/07/2018 5:06 PM

## 2018-04-07 NOTE — Progress Notes (Unsigned)
Critical MG and Potassium reported to and read back by rn Ruben Im

## 2018-04-07 NOTE — ED Provider Notes (Addendum)
[facility] DEPT Provider Note   CSN: [id number] Arrival date & time: 04/07/18  1600    History   Chief Complaint Chief Complaint  Patient presents with  . Urinary Tract Infection  . Cancer Patient    HPI Stacy Shelton is a 58 y.o. female.     Patient sent in from cancer clinic.  Patient has a known history of metastatic breast cancer.  Patient had evaluation there today was noted to be febrile with a temp of 101.8.  They began the work-up there they did labs as well as blood cultures and urinalysis lactic acid was not done.  She was given 1 round of Levaquin because urinalysis was not consistent with urinary tract infection.  Patient last had chemo treatments on February 26.  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  Patient also was anemic.  And magnesium was low as well.  They supplemented her with magnesium there upon arrival here patient was tachycardic respiratory rate was up with blood pressures were never below 397 systolic.  Patient started with a fever this morning.  Saturday she had some diarrhea.  No known sick contacts.  No travel.  Patient has not been out of her house for the past 3 weeks.  Her hematologist oncologist is Alvy Bimler.  And clearly making meeting sepsis criteria.  Although it may be that she is secondary to urinary tract infection will broaden work-up to include chest x-ray and will get lactic acids.  And will treat with a 30 cc/kg fluid challenge.     Past Medical History:  Diagnosis Date  . Cancer [facility])     Patient Active Problem List   Diagnosis Date Noted  . Diarrhea 03/21/2018  . Metastatic breast cancer ([place]) 03/17/2018  . 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 Metastasis to bone ([place]) 03/17/2018  . Pancytopenia, acquired ([place]) 03/17/2018  . Goals of care, counseling/discussion 03/17/2018  . Encounter for antineoplastic chemotherapy  03/17/2018  . Symptomatic anemia 03/11/2018  . Thrombocytopenia ([place]) 03/11/2018  . Uncontrolled diabetes mellitus with hyperglycemia ([place]) 03/11/2018  . Moderate persistent asthma 03/11/2018    Past Surgical History:  Procedure Laterality Date  . IR IMAGING GUIDED PORT INSERTION  03/24/2018  . KIDNEY STONE SURGERY    . SINOSCOPY    . WISDOM TOOTH EXTRACTION       OB History   No obstetric history on file.      Home Medications    Prior to Admission medications   Medication Sig Start Date End Date Taking? Authorizing Provider  albuterol (PROAIR HFA) 108 (90 Base) MCG/ACT inhaler Inhale 2 puffs into the lungs every 6 (six) hours as needed for wheezing or shortness of breath. 05/16/17  Yes Padgett, Rae Halsted, MD  dexamethasone (DECADRON) 4 MG tablet Take 1 tablet in the morning before chemo, none on the day of chemo and daily in the morning after chemo for 2 days, with food 03/21/18  Yes Alvy Bimler, Ni, MD  esomeprazole (NEXIUM) 40 MG capsule Take 1 capsule (40 mg total) by mouth 2 (two) times daily before a meal. 05/16/17  Yes Padgett, Rae Halsted, MD  fluticasone (FLOVENT HFA) 44 MCG/ACT inhaler Inhale 2 puffs into the lungs 2 (two) times daily. 05/16/17  Yes Padgett, Rae Halsted, MD  insulin aspart (NOVOLOG) 100 UNIT/ML FlexPen Inject 8 Units into the skin 3 (three) times daily with meals. 03/13/18  Yes Donne Hazel, MD  Insulin Detemir (LEVEMIR) 100 UNIT/ML Pen Inject 20 Units into the skin daily. 03/13/18  Yes Donne Hazel, MD  lidocaine-prilocaine (EMLA) cream Apply to affected area once 03/21/18  Yes [name], MD  LORazepam (ATIVAN) 0.5 MG tablet Take 1 tab po 30 minutes prior to radiation or MRI 03/14/18  Yes Hayden Pedro, PA-C  metFORMIN (GLUCOPHAGE) 500 MG tablet Take 1 tablet (500 mg total) by mouth 2 (two) times daily with a meal. 03/17/18  Yes Gorsuch, Ni, MD  metoprolol succinate (TOPROL-XL) 50 MG 24 hr tablet Take 50 mg by mouth every evening.  03/10/18  Yes [provider]  montelukast (SINGULAIR) 10 MG tablet TAKE 1 TABLET BY MOUTH EVERYDAY AT BEDTIME Patient taking differently: Take 10 mg by mouth daily.  08/12/17  Yes Padgett, Rae Halsted, MD  Multiple Vitamin (MULTIVITAMIN WITH MINERALS) TABS tablet Take 1 tablet by mouth daily.   Yes [provider]  ranitidine (ZANTAC) 300 MG tablet Take 1 tablet (300 mg total) by mouth at bedtime. Patient taking differently: Take 300 mg by mouth at bedtime as needed for heartburn.  05/16/17  Yes Kennith Gain, MD  ACCU-CHEK AVIVA PLUS test strip  03/13/18   [provider]  ACCU-CHEK SOFTCLIX LANCETS lancets  03/13/18   [provider]  blood glucose meter kit and supplies KIT Dispense based on patient and insurance preference. Use up to four times daily as directed. (FOR ICD-9 250.00, 250.01). 03/13/18   Donne Hazel, MD  Insulin Pen Needle 31G X 5 MM MISC 1 Device by Does not apply route QID. For use with insulin pens 03/13/18   Donne Hazel, MD  ondansetron (ZOFRAN) 8 MG tablet Take 1 tablet (8 mg total) by mouth every 8 (eight) hours as needed for refractory nausea / vomiting. Patient not taking: Reported on 04/07/2018 03/21/18   [name], MD  prochlorperazine (COMPAZINE) 10 MG tablet Take 1 tablet (10 mg total) by mouth every 6 (six) hours as needed (Nausea or vomiting). Patient not taking: Reported on 04/07/2018 03/21/18   [name], MD  triamcinolone (NASACORT ALLERGY 24HR) 55 MCG/ACT AERO nasal inhaler Place 2 sprays into the nose daily.    [provider]    Family History Family History  Problem Relation Age of Onset  . Allergic rhinitis Neg Hx   . Angioedema Neg Hx   . Asthma Neg Hx   . Atopy Neg Hx   . Eczema Neg Hx   . Immunodeficiency Neg Hx   . Urticaria Neg Hx     Social History Social History   Tobacco Use  . Smoking status: Never Smoker  . Smokeless tobacco: Never Used  Substance Use Topics  . Alcohol  use: Yes    Alcohol/week: 0.0 standard drinks  . Drug use: No     Allergies   Nsaids   Review of Systems Review of Systems  Constitutional: Positive for chills and fatigue.  HENT: Negative for congestion.   Eyes: Negative for redness.  Respiratory: Negative for cough and shortness of breath.   Gastrointestinal: Positive for diarrhea. Negative for abdominal pain.  Genitourinary: Negative for dysuria and hematuria.  Musculoskeletal: Negative for myalgias.  Skin: Negative for rash.  Allergic/Immunologic: Positive for immunocompromised state.  Hematological: Does not bruise/bleed easily.  Psychiatric/Behavioral: Negative for confusion.     Physical Exam Updated Vital Signs BP (!) 120/57   Pulse (!) 123   Temp 99.3 F (37.4 C) (Oral)  Resp (!) 36   Ht 1.575 m (5' 2" )   Wt 67.1 kg   LMP 10/30/2010   SpO2 96%   BMI 27.07 kg/m   Physical Exam Vitals signs and nursing note reviewed.  Constitutional:      General: She is not in acute distress.    Appearance: She is well-developed.  HENT:     Head: Normocephalic and atraumatic.     Nose: No congestion.     Mouth/Throat:     Mouth: Mucous membranes are moist.  Eyes:     Extraocular Movements: Extraocular movements intact.     Conjunctiva/sclera: Conjunctivae normal.     Pupils: Pupils are equal, round, and reactive to light.  Neck:     Musculoskeletal: Normal range of motion and neck supple.  Cardiovascular:     Rate and Rhythm: Regular rhythm. Tachycardia present.     Heart sounds: No murmur.  Pulmonary:     Effort: Pulmonary effort is normal. No respiratory distress.     Breath sounds: Normal breath sounds.  Abdominal:     General: Bowel sounds are normal.     Palpations: Abdomen is soft.     Tenderness: There is no abdominal tenderness.  Musculoskeletal: Normal range of motion.  Skin:    General: Skin is warm and dry.     Capillary Refill: Capillary refill takes less than 2 seconds.  Neurological:      General: No focal deficit present.     Mental Status: She is alert and oriented to person, place, and time.      ED Treatments / Results  Labs (all labs ordered are listed, but only abnormal results are displayed) Labs Reviewed  LACTIC ACID, PLASMA  LACTIC ACID, PLASMA  PREPARE RBC (CROSSMATCH)  TYPE AND SCREEN    EKG EKG Interpretation  Date/Time:  Monday April 07 2018 17:19:42 EDT Ventricular Rate:  128 PR Interval:    QRS Duration: 82 QT Interval:  289 QTC Calculation: 983 R Axis:   -54 Text Interpretation:  Sinus tachycardia Left anterior fascicular block Anteroseptal infarct, age indeterminate No previous ECGs available Confirmed by Fredia Sorrow [id number]) on 04/07/2018 5:28:09 PM   Radiology Dg Chest Port 1 View  Result Date: 04/07/2018 CLINICAL DATA:  Breast cancer with metastases.  Fever. EXAM: PORTABLE CHEST 1 VIEW COMPARISON:  CT scan March 11, 2018 FINDINGS: There is an oval hazy opacity in the right upper lobe which correlates with the site of a previously identified nodule, likely a metastatic lesion. No other definitive pulmonary nodules. The heart, hila, and mediastinum are unremarkable. No pneumothorax. The right Port-A-Cath terminates in the SVC. No other acute abnormalities. IMPRESSION: 1. The ill-defined hazy rounded opacity in the right upper lobe correlates with a previously identified nodule thought to be a metastatic lesion. No other abnormalities identified on today's study. Electronically Signed   By: Dorise Bullion III M.D   On: 04/07/2018 16:54    Procedures Procedures (including critical care time)  CRITICAL CARE Performed by: Fredia Sorrow Total critical care time: 30 minutes Critical care time was exclusive of separately billable procedures and treating other patients. Critical care was necessary to treat or prevent imminent or life-threatening deterioration. Critical care was time spent personally by me on the following activities:  development of treatment plan with patient and/or surrogate as well as nursing, discussions with consultants, evaluation of patient's response to treatment, examination of patient, obtaining history from patient or surrogate, ordering and performing treatments and interventions, ordering and review  of laboratory studies, ordering and review of radiographic studies, pulse oximetry and re-evaluation of patient's condition.   Medications Ordered in ED Medications  metroNIDAZOLE (FLAGYL) IVPB 500 mg (500 mg Intravenous New Bag/Given 04/07/18 1802)  vancomycin (VANCOCIN) 1,500 mg in sodium chloride 0.9 % 500 mL IVPB (has no administration in time range)  0.9 %  sodium chloride infusion (has no administration in time range)  sodium chloride 0.9 % bolus 1,000 mL (0 mLs Intravenous Stopped 04/07/18 1811)    And  sodium chloride 0.9 % bolus 1,000 mL (1,000 mLs Intravenous New Bag/Given 04/07/18 1712)    And  sodium chloride 0.9 % bolus 250 mL (0 mLs Intravenous Stopped 04/07/18 1811)  ceFEPIme (MAXIPIME) 2 g in sodium chloride 0.9 % 100 mL IVPB (0 g Intravenous Stopped 04/07/18 1754)  ondansetron (ZOFRAN) injection 4 mg (4 mg Intravenous Given 04/07/18 1754)     Initial Impression / Assessment and Plan / ED Course  I have reviewed the triage vital signs and the nursing notes.  Pertinent labs & imaging results that were available during my care of the patient were reviewed by me and considered in my medical decision making (see chart for details).       Patient sent over from hematology oncology clinic with a fever to 101.8.  They began the work-up over there noted that she had anemia no she had a urinary tract infection.  Noticed that her magnesium was low.  Upon arrival here broadened the antibiotic she did receive 1 dose of Levaquin there but did not want to overlook possible blood infection.  Chest x-ray was negative lactic acid did come back normal.  Patient's hemoglobin was in the 7 range.  Type and cross  and packed red blood cell transfusion was ordered for 2 units.  In addition patient received 30 cc/kg normal saline fluid boluses.  Discussed with hospitalist they will admit.  Hematology oncology team knows that she is here.  Hospitalist has ordered repeat labs.   Final Clinical Impressions(s) / ED Diagnoses   Final diagnoses:  Sepsis, due to unspecified organism, unspecified whether acute organ dysfunction present ([place])  Anemia, unspecified type  Acute cystitis without hematuria  Metastatic breast cancer [facility])    ED Discharge Orders    None       Fredia Sorrow, MD 04/07/18 [place]le Nordmann    Fredia Sorrow, MD 04/07/18 2150

## 2018-04-07 NOTE — Telephone Encounter (Signed)
Done.  She is coming in this morning

## 2018-04-07 NOTE — H&P (Addendum)
Stacy Shelton:[id number] DOB: May 29, 1960 DOA: 04/07/2018     PCP: Robyne Peers, MD   Outpatient Specialists:     Pulmonary  Dr. Ok Edwards Paget  Oncology  Dr. [name]    Patient arrived to ER on 04/07/18 at 1600  Patient coming from: home Lives   With family   Chief Complaint:  Chief Complaint  Patient presents with  . Urinary Tract Infection  . Cancer Patient    HPI: Stacy Shelton is a 58 y.o. 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  This morning had a fever up to 101.4. no dysuria Diarrhea X 7 times in the past 24h Non bloody She had a root canal 3 wks ago and was given Amoxicillin   No sick contacts  Reports her HR have been staying up since she was diagnosed with cancer Denies any chest pain no shortness of breath  Family brought her into her oncologist office level blood pressure 108/56 pulse up to 120 She was started on Levaquin UA was obtained as well as influenza panel was negative  He was found to be anemic globin down to 7.2 felt to be secondary to bone marrow failure.  Typed and screened and plan to transfuse 2 units.  Tylenol 650 mg She was found to have hypomagnesemia 1.3 which was replaced with 4 g of magnesium. Potassium was found to be 2.7 she was given 40 mEq  Was given Lomotil for her diarrhea.  UA showed evidence of UTI she was diagnosed with cystitis.  Culture was ordered She was given a dose of Zofran 4 mg IV When severity of illness patient was instructed to go to emergency department  Regarding pertinent Chronic problems: Metastatic breast cancer to the brain liver and bone gated by pancytopenia  last chemo 03/26/2018 Taxotere, Perjeta, and Herceptin  history of diabetes on Levemir and NovoLog  Hypertension on Toprol While in ER: On  arrival heart rate up to 123 Sepsis criteria Blood cultures obtained Started on vancomycin Flagyl Cefepime IV fluids ordered 2 L  No BM   The following Work up has been ordered so far:  Orders Placed This Encounter  Procedures  . DG Chest Port 1 View  . Lactic acid, plasma  . Diet NPO time specified  . Cardiac monitoring  . Refer to Sidebar Report for: Sepsis Bundle ED/IP  . Document vital signs within 1-hour of fluid bolus completion and notify provider of bolus completion  . Document Actual / Estimated Weight  . Insert peripheral IV x 2  . Initiate Carrier Fluid Protocol  . Complete patient signature process for consent form  . Practitioner attestation of consent  . Call Code Sepsis ([facility] [phone]) Reason for Consult? tracking  . Consult to hospitalist  . Pulse oximetry, continuous  . ED EKG 12-Lead  . Prepare RBC  . Type and screen [place]    Following Medications were ordered in ER: Medications  metroNIDAZOLE (FLAGYL) IVPB 500 mg (500 mg Intravenous New Bag/Given 04/07/18 1802)  vancomycin (VANCOCIN) 1,500 mg in sodium chloride 0.9 % 500 mL IVPB (has no administration in time range)  0.9 %  sodium chloride infusion (has no administration in time range)  sodium chloride 0.9 % bolus 1,000 mL (  0 mLs Intravenous Stopped 04/07/18 1811)    And  sodium chloride 0.9 % bolus 1,000 mL (1,000 mLs Intravenous New Bag/Given 04/07/18 1712)    And  sodium chloride 0.9 % bolus 250 mL (0 mLs Intravenous Stopped 04/07/18 1811)  ceFEPIme (MAXIPIME) 2 g in sodium chloride 0.9 % 100 mL IVPB (0 g Intravenous Stopped 04/07/18 1754)  ondansetron (ZOFRAN) injection 4 mg (4 mg Intravenous Given 04/07/18 1754)    Significant initial  Findings: Abnormal Labs Reviewed - No abnormal labs to display   Lactic Acid, Venous    Component Value Date/Time   LATICACIDVEN 1.9 04/07/2018 1626   At 11:43 AM Na 132 K 2.7 Mg 1.3  Cr    stable,  Up from baseline see below Lab  Results  Component Value Date   CREATININE 0.78 04/07/2018   CREATININE 0.75 03/24/2018   CREATININE 0.83 03/21/2018      WBC 11.1  HG/HCT   Down  from baseline see below    Component Value Date/Time   HGB 7.2 (L) 04/07/2018 1141   HCT 22.9 (L) 04/07/2018 1141     Troponin (Point of Care Test) No results for input(s): TROPIPOC in the last 72 hours.    UA evidence of UTI    CXR - NON acute finding nodule which was there before   ECG:  Personally reviewed by me showing: HR : 128 Rhythm:  Sinus tachycardia    Left anterior fascicular block no evidence of ischemic changes QTC 422      ED Triage Vitals  Enc Vitals Group     BP 04/07/18 1613 (!) 111/56     Pulse Rate 04/07/18 1613 (!) 109     Resp 04/07/18 1613 (!) 24     Temp 04/07/18 1613 99.3 F (37.4 C)     Temp Source 04/07/18 1613 Oral     SpO2 04/07/18 1613 100 %     Weight 04/07/18 1614 148 lb (67.1 kg)     Height 04/07/18 1614 5' 2"  (1.575 m)     Head Circumference --      Peak Flow --      Pain Score 04/07/18 1614 0     Pain Loc --      Pain Edu? --      Excl. in [place]? --   TMAX(24)@       Latest  Blood pressure (!) 120/57, pulse (!) 123, temperature 99.3 F (37.4 C), temperature source Oral, resp. rate (!) 36, height 5' 2"  (1.575 m), weight 67.1 kg, last menstrual period 10/30/2010, SpO2 96 %.    Hospitalist was called for admission for Sepsis due to UTI   Review of Systems:    Pertinent positives include: Fevers, chills, fatigue,abdominal pain, nausea, vomiting,  Constitutional:  No weight loss, night sweats,  weight loss  HEENT:  No headaches, Difficulty swallowing,Tooth/dental problems,Sore throat,  No sneezing, itching, ear ache, nasal congestion, post nasal drip,  Cardio-vascular:  No chest pain, Orthopnea, PND, anasarca, dizziness, palpitations.no Bilateral lower extremity swelling  GI:  No heartburn, indigestion,  diarrhea, change in bowel habits, loss of appetite, melena, blood in  stool, hematemesis Resp:  no shortness of breath at rest. No dyspnea on exertion, No excess mucus, no productive cough, No non-productive cough, No coughing up of blood.No change in color of mucus.No wheezing. Skin:  no rash or lesions. No jaundice GU:  no dysuria, change in color of urine, no urgency or frequency. No straining to urinate.  No flank  pain.  Musculoskeletal:  No joint pain or no joint swelling. No decreased range of motion. No back pain.  Psych:  No change in mood or affect. No depression or anxiety. No memory loss.  Neuro: no localizing neurological complaints, no tingling, no weakness, no double vision, no gait abnormality, no slurred speech, no confusion  All systems reviewed and apart from [place] all are negative  Past Medical History:   Past Medical History:  Diagnosis Date  . Cancer [facility])       Past Surgical History:  Procedure Laterality Date  . IR IMAGING GUIDED PORT INSERTION  03/24/2018  . KIDNEY STONE SURGERY    . SINOSCOPY    . WISDOM TOOTH EXTRACTION      Social History:  Ambulatory   Independently      reports that she has never smoked. She has never used smokeless tobacco. She reports current alcohol use. She reports that she does not use drugs.     Family History:   Family History  Problem Relation Age of Onset  . Allergic rhinitis Neg Hx   . Angioedema Neg Hx   . Asthma Neg Hx   . Atopy Neg Hx   . Eczema Neg Hx   . Immunodeficiency Neg Hx   . Urticaria Neg Hx     Allergies: Allergies  Allergen Reactions  . Nsaids Anaphylaxis     Prior to Admission medications   Medication Sig Start Date End Date Taking? Authorizing Provider  albuterol (PROAIR HFA) 108 (90 Base) MCG/ACT inhaler Inhale 2 puffs into the lungs every 6 (six) hours as needed for wheezing or shortness of breath. 05/16/17  Yes Padgett, Rae Halsted, MD  dexamethasone (DECADRON) 4 MG tablet Take 1 tablet in the morning before chemo, none on the day of chemo and  daily in the morning after chemo for 2 days, with food 03/21/18  Yes Alvy Bimler, Ni, MD  esomeprazole (NEXIUM) 40 MG capsule Take 1 capsule (40 mg total) by mouth 2 (two) times daily before a meal. 05/16/17  Yes Padgett, Rae Halsted, MD  fluticasone (FLOVENT HFA) 44 MCG/ACT inhaler Inhale 2 puffs into the lungs 2 (two) times daily. 05/16/17  Yes Padgett, Rae Halsted, MD  insulin aspart (NOVOLOG) 100 UNIT/ML FlexPen Inject 8 Units into the skin 3 (three) times daily with meals. 03/13/18  Yes Donne Hazel, MD  Insulin Detemir (LEVEMIR) 100 UNIT/ML Pen Inject 20 Units into the skin daily. 03/13/18  Yes Donne Hazel, MD  lidocaine-prilocaine (EMLA) cream Apply to affected area once 03/21/18  Yes [name], MD  LORazepam (ATIVAN) 0.5 MG tablet Take 1 tab po 30 minutes prior to radiation or MRI 03/14/18  Yes Hayden Pedro, PA-C  metFORMIN (GLUCOPHAGE) 500 MG tablet Take 1 tablet (500 mg total) by mouth 2 (two) times daily with a meal. 03/17/18  Yes Gorsuch, Ni, MD  metoprolol succinate (TOPROL-XL) 50 MG 24 hr tablet Take 50 mg by mouth every evening. 03/10/18  Yes [provider]  montelukast (SINGULAIR) 10 MG tablet TAKE 1 TABLET BY MOUTH EVERYDAY AT BEDTIME Patient taking differently: Take 10 mg by mouth daily.  08/12/17  Yes Padgett, Rae Halsted, MD  Multiple Vitamin (MULTIVITAMIN WITH MINERALS) TABS tablet Take 1 tablet by mouth daily.   Yes [provider]  ranitidine (ZANTAC) 300 MG tablet Take 1 tablet (300 mg total) by mouth at bedtime. Patient taking differently: Take 300 mg by mouth at bedtime as needed for heartburn.  05/16/17  Yes Padgett,  Rae Halsted, MD  ACCU-CHEK AVIVA PLUS test strip  03/13/18   [provider]  ACCU-CHEK SOFTCLIX LANCETS lancets  03/13/18   [provider]  blood glucose meter kit and supplies KIT Dispense based on patient and insurance preference. Use up to four times daily as directed. (FOR ICD-9 250.00, 250.01).  03/13/18   Donne Hazel, MD  Insulin Pen Needle 31G X 5 MM MISC 1 Device by Does not apply route QID. For use with insulin pens 03/13/18   Donne Hazel, MD  ondansetron (ZOFRAN) 8 MG tablet Take 1 tablet (8 mg total) by mouth every 8 (eight) hours as needed for refractory nausea / vomiting. Patient not taking: Reported on 04/07/2018 03/21/18   [name], MD  prochlorperazine (COMPAZINE) 10 MG tablet Take 1 tablet (10 mg total) by mouth every 6 (six) hours as needed (Nausea or vomiting). Patient not taking: Reported on 04/07/2018 03/21/18   [name], MD  triamcinolone (NASACORT ALLERGY 24HR) 55 MCG/ACT AERO nasal inhaler Place 2 sprays into the nose daily.    [provider]   Physical Exam: Blood pressure (!) 120/57, pulse (!) 123, temperature 99.3 F (37.4 C), temperature source Oral, resp. rate (!) 36, height 5' 2"  (1.575 m), weight 67.1 kg, last menstrual period 10/30/2010, SpO2 96 %. 1. General:  in No  Acute distress   Chronically ill  -appearing 2. Psychological: Alert and Oriented 3. Head/ENT:     Dry Mucous Membranes                          Head Non traumatic, neck supple                           Poor Dentition 4. SKIN:  decreased Skin turgor,  Skin clean Dry and intact no rash 5. Heart: Regular rate and rhythm no Murmur, no Rub or gallop 6. Lungs:  Clear to auscultation bilaterally,   7. Abdomen: Soft,  non-tender, distended   obese  bowel sounds present 8. Lower extremities: no clubbing, cyanosis, no  edema 9. Neurologically Grossly intact, moving all 4 extremities equally  10. MSK: Normal range of motion   LABS:     Recent Labs  Lab 04/07/18 1141  WBC 11.1*  NEUTROABS 4.2  HGB 7.2*  HCT 22.9*  MCV 83.3  PLT 798   Basic Metabolic Panel: Recent Labs  Lab 04/07/18 1143  NA 132*  K 2.7*  CL 95*  CO2 22  GLUCOSE 223*  BUN 10  CREATININE 0.78  CALCIUM 8.7*  MG 1.3*      Recent Labs  Lab 04/07/18 1143  AST 15  ALT 13  ALKPHOS 92  BILITOT  1.5*  PROT 7.4  ALBUMIN 3.7   No results for input(s): LIPASE, AMYLASE in the last 168 hours. No results for input(s): AMMONIA in the last 168 hours.    HbA1C: No results for input(s): HGBA1C in the last 72 hours. CBG: No results for input(s): GLUCAP in the last 168 hours.    Urine analysis:    Component Value Date/Time   COLORURINE YELLOW 04/07/2018 1115   APPEARANCEUR HAZY (A) 04/07/2018 1115   LABSPEC 1.015 04/07/2018 1115   PHURINE 6.0 04/07/2018 1115   GLUCOSEU NEGATIVE 04/07/2018 1115   HGBUR SMALL (A) 04/07/2018 1115   BILIRUBINUR NEGATIVE 04/07/2018 1115   KETONESUR 20 (A) 04/07/2018 1115   PROTEINUR 100 (A) 04/07/2018 1115  NITRITE POSITIVE (A) 04/07/2018 1115   LEUKOCYTESUR SMALL (A) 04/07/2018 1115      Cultures: No results found for: SDES, SPECREQUEST, CULT, REPTSTATUS   Radiological Exams on Admission: Dg Chest Port 1 View  Result Date: 04/07/2018 CLINICAL DATA:  Breast cancer with metastases.  Fever. EXAM: PORTABLE CHEST 1 VIEW COMPARISON:  CT scan March 11, 2018 FINDINGS: There is an oval hazy opacity in the right upper lobe which correlates with the site of a previously identified nodule, likely a metastatic lesion. No other definitive pulmonary nodules. The heart, hila, and mediastinum are unremarkable. No pneumothorax. The right Port-A-Cath terminates in the SVC. No other acute abnormalities. IMPRESSION: 1. The ill-defined hazy rounded opacity in the right upper lobe correlates with a previously identified nodule thought to be a metastatic lesion. No other abnormalities identified on today's study. Electronically Signed   By: Dorise Bullion III M.D   On: 04/07/2018 16:54    Chart has been reviewed    Assessment/Plan   58 y.o. 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 Sepsis ([place]) -   -Patient meets sepsis criteria with  fever     Tachycardia   Initial lactic acid Lactic Acid, Venous    Component Value Date/Time   LATICACIDVEN 1.9 04/07/2018 1626   Source most likely:  UTI,vs Diarrhea illness  -We will rehydrate, treat with IV antibiotics, follow lactic acid - Await results of blood and urine culture and adjust antibiotics as needed - Obtain MRSA serologies -If continues to show signs of hemodynamic instability will need PCCM consult patient remained persistently hypotensive discussed with PCCM and started phenylephrine critical care to see in consult    UTI -  - treat with Rocephin       await results of urine culture and adjust antibiotic coverage as needed  . Metastatic breast cancer [facility]) - Oncology is aware . Diarrhea - order gastric panel pt had less than 24H of diarrhe will hold off on c.diff testing for now but would do if persists given recent antibiotic use as an outpatient  . Hypokalemia - - will replace and repeat in AM,  check magnesium level and replace as needed Hypophosphatemia - will replace . Hypomagnesemia - will replace . Symptomatic anemia - will transfuse 2 units Hx of HTN - home meds on hold due to hypotension  DM 2 -  - Order Sensitive SSI   - continue home insulin regimen decrease to 15 units given decreased PO intake    -  check TSH and HgA1C     Other plan as per orders.  DVT prophylaxis:   scd     Code Status:  FULL CODE as per patient   I had personally discussed CODE STATUS with patient    Family Communication:   Family not at  Bedside   Disposition Plan:    To home once workup is complete and patient is stable                       Consults called: Oncology aware  Admission status:    Obs         Level of care         SDU tele indefinitely please discontinue once patient no longer qualifies    Neno Hohensee 04/07/2018, 9:57 PM    [facility]  after 2 AM please page floor coverage PA If 7AM-7PM, please contact the day team taking care of the  patient using [email or web address]

## 2018-04-07 NOTE — Telephone Encounter (Signed)
pls get her to [facility]

## 2018-04-07 NOTE — ED Notes (Addendum)
Pt's O2 sats decreased to 88%.  Pt placed on 2L [place].  Pt now 95%

## 2018-04-07 NOTE — Progress Notes (Signed)
Pharmacy Antibiotic Note  Stacy Shelton is a 58 y.o. female admitted on 04/07/2018 with sepsis and fever with unknown source.  PMH significant for metastatic breast cancer (currently undergoing chemotherapy). Noted UA with evidence of UTI.  In the ED, patient was ordered initial doses of Vancomycin, Cefepime and Flagyl.  Upon admission, Pharmacy has been consulted for Vancomycin and Cefepime dosing.  Plan:  Vancomycin 1250 mg IV Q 24 hrs. Goal AUC 400-550.  Expected AUC: 472.8; SCr used: 0.8 (rounded up from 0.78)  Cefepime 2gm IV q8h  Follow renal function  Monitor vancomycin levels as needed   Height: 5\' 2"  (157.5 cm) Weight: 148 lb (67.1 kg) IBW/kg (Calculated) : 50.1  Temp (24hrs), Avg:100.2 F (37.9 C), Min:99.3 F (37.4 C), Max:101.8 F (38.8 C)  Recent Labs  Lab 04/07/18 1141 04/07/18 1143 04/07/18 1626  WBC 11.1*  --   --   CREATININE  --  0.78  --   LATICACIDVEN  --   --  1.9    Estimated Creatinine Clearance: 68.9 mL/min (by C-G formula based on SCr of 0.78 mg/dL).    Allergies  Allergen Reactions  . Nsaids Anaphylaxis    Antimicrobials this admission: 3/9 Levaquin x 1 at [facility] 3/9 Cefepime >>   3/9 Vanc >> 3/9 Flagyl >>   Dose adjustments this admission:    Microbiology results:  Thank you for allowing pharmacy to be a part of this patient's care.  Everette Rank, PharmD 04/07/2018 7:48 PM

## 2018-04-07 NOTE — ED Notes (Signed)
Pt vomiting.  Asked MD for nausea meds.

## 2018-04-07 NOTE — ED Notes (Signed)
Stopped 0.9% NS with K and Mg.

## 2018-04-07 NOTE — Progress Notes (Signed)
[facility] Progress Note   Stacy Shelton [id number] 06-Apr-1960 58 y.o.  Stacy Shelton is managed by  Dr. 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 ([place]) - Plan: Sample to Blood Bank, Practitioner attestation of consent, Complete patient signature process for consent form, Type and screen, Care order/instruction, Prepare RBC, Transfuse RBC, 0.9 %  sodium chloride infusion (Manually program via Guardrails IV Fluids), acetaminophen (TYLENOL) tablet 650 mg, Prepare RBC  Hypomagnesemia - Plan: Magnesium, sodium chloride 0.9 % 1,000 mL with potassium chloride 40 mEq, magnesium sulfate 4 g infusion, DISCONTINUED: magnesium sulfate 4 g in sodium chloride 0.9 % 1,000 mL  Diarrhea, unspecified type - Plan: Comprehensive metabolic panel, diphenoxylate-atropine (LOMOTIL) 2.5-0.025 MG per tablet 1 tablet  Acute cystitis with hematuria   Fever: A PCR influenza panel returned negative.  Patient was given Tylenol 650 mg p.o. x1 and was begun on Levaquin 500 mg IV x1.  A request was initially made to have the patient directly admitted to [facility] however given her other abnormal labs and tachycardia it was decided that the patient should be taken to the emergency room for initial evaluation and management.  Non-intractable nausea and vomiting: The patient was given Zofran 4 mg IV x1.  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  A crossmatch was completed with 2 units of packed red blood cells ordered.  Hypomagnesemia and hypokalemia: Magnesium level returned at 1.3.  The patient was begun on an IV of 4 g of magnesium 1 L of normal saline.  Additionally the patient's potassium returned at 2.7.  40 mEq of potassium chloride were added to the patient's IV today.  Diarrhea: Patient was given Lomotil 1 tablet p.o. x1.  Acute cystitis: A urinalysis returned showing findings consistent with a urinary tract infection.  Patient had been given 500 mg of Levaquin IV x1.  A urine culture is pending.  The patient was referred to the emergency room for evaluation and management.  Please see After Visit Summary for patient specific instructions.  Future Appointments  Date Time Provider [place]  04/16/2018  7:30 AM [facility] LAB 3 [facility] None  04/16/2018  8:00 AM [facility] [place] None  04/16/2018  8:30 AM Alvy Bimler, Ni, MD [facility] None  04/16/2018  9:00 AM [facility] INFUSION [facility] None  04/21/2018  3:00 PM Hayden Pedro, PA-C [facility] None  05/07/2018  8:30 AM [facility] LAB 3 [facility] None  05/07/2018  9:00 AM [facility] [place] FLUSH [facility] None  05/07/2018  9:30 AM [name], MD [facility] None  05/07/2018 10:30 AM [facility] INFUSION [facility] None    Orders Placed This Encounter  Procedures  . Comprehensive metabolic panel  . Magnesium  . Influenza panel by PCR (type A & B)  . Sample to Blood Bank  . Type and screen  . Prepare RBC       Subjective:   Patient ID:  Stacy Shelton is a 57 y.o. (  DOB Apr 13, 1960) female.  Chief Complaint:  Chief Complaint  Patient presents with  . Fever    HPI Stacy Shelton is a 58 year old female with a history of a metastatic breast cancer with widely metastatic disease including the left breast, brain, liver, lung, and bone marrow.  The patient is managed by Dr. [name] and is status  post cycle 1, day 1 of Taxotere, Perjeta, and Herceptin which was dosed on 03/26/2018.  She presents to clinic today with a report of an acute onset of a cough, fever, nausea, vomiting, weakness, fatigue, and diarrhea yesterday.  She reports having at least 6 episodes of diarrhea yesterday.  Her temperature on arrival today was 101.8.  Her labs returning showing a hemoglobin of 7.2 and a hematocrit of 22.9.  Patient's magnesium returned at 1.3 with a potassium of 2.7.  She denies travel and is had no sick contacts.  Medications: I have reviewed the patient's current medications.  Allergies:  Allergies  Allergen Reactions  . Nsaids Anaphylaxis    Past Medical History:  Diagnosis Date  . Cancer [facility])     Past Surgical History:  Procedure Laterality Date  . IR IMAGING GUIDED PORT INSERTION  03/24/2018  . KIDNEY STONE SURGERY    . SINOSCOPY    . WISDOM TOOTH EXTRACTION      Family History  Problem Relation Age of Onset  . Allergic rhinitis Neg Hx   . Angioedema Neg Hx   . Asthma Neg Hx   . Atopy Neg Hx   . Eczema Neg Hx   . Immunodeficiency Neg Hx   . Urticaria Neg Hx     Social History   Socioeconomic History  . Marital status: Single    Spouse name: Not on file  . Number of children: Not on file  . Years of education: Not on file  . Highest education level: Not on file  Occupational History  . Not on file  Social Needs  . Financial resource strain: Not on file  . Food insecurity:    Worry: Not on file    Inability: Not on file  . Transportation needs:    Medical: Not on file    Non-medical: Not on file  Tobacco Use  . Smoking status: Never Smoker  . Smokeless tobacco: Never Used  Substance and Sexual Activity  . Alcohol use: Yes    Alcohol/week: 0.0 standard drinks  . Drug use: No  . Sexual activity: Not on file  Lifestyle  . Physical activity:    Days per week: Not on file    Minutes per session: Not on file  . Stress: Not on file  Relationships  .  Social connections:    Talks on phone: Not on file    Gets together: Not on file    Attends religious service: Not on file    Active member of club or organization: Not on file    Attends meetings of clubs or organizations: Not on file    Relationship status: Not on file  . Intimate partner violence:    Fear of current or ex partner: Not on file    Emotionally abused: Not on file    Physically abused: Not on file    Forced sexual activity: Not on file  Other Topics Concern  . Not on file  Social History Narrative  . Not on file    Past Medical History, Surgical history, Social history, and Family history were reviewed and updated as appropriate.  Please see review of systems for further details on the patient's review from today.   Review of Systems:  Review of Systems  Constitutional: Positive for fatigue and fever. Negative for appetite change, chills and diaphoresis.  HENT: Negative for trouble swallowing.   Respiratory: Positive for cough. Negative for choking, chest tightness, shortness of breath and wheezing.   Cardiovascular: Negative for chest pain, palpitations and leg swelling.  Gastrointestinal: Positive for diarrhea. Negative for abdominal distention, abdominal pain, blood in stool, constipation, nausea and vomiting.  Genitourinary: Negative for decreased urine volume, difficulty urinating and dysuria.  Neurological: Positive for weakness. Negative for headaches.    Objective:   Physical Exam:  BP (!) 108/56 (BP Location: Left Arm, Patient Position: Sitting) Comment: Liza RN was notified  Pulse (!) 120 Comment: Audiological scientist was notified  Temp 99.4 F (37.4 C) (Oral)   Resp 16   Ht 5\' 2"  (1.575 m)   LMP 10/30/2010   SpO2 98%   BMI 27.80 kg/m  ECOG: 1  Physical Exam Constitutional:      General: She is not in acute distress.    Appearance: She is not ill-appearing, toxic-appearing or diaphoretic.     Comments: Patient is an adult female who appears pale but  in no acute distress.  HENT:     Head: Normocephalic and atraumatic.     Mouth/Throat:     Mouth: Mucous membranes are moist.     Pharynx: No oropharyngeal exudate or posterior oropharyngeal erythema.     Comments: Mucous membranes are pale Eyes:     Comments: Conjunctiva is pale  Cardiovascular:     Rate and Rhythm: Regular rhythm. Tachycardia present.     Heart sounds: Normal heart sounds. No murmur. No friction rub. No gallop.   Pulmonary:     Effort: Pulmonary effort is normal. No respiratory distress.     Breath sounds: Normal breath sounds. No stridor. No wheezing or rales.  Abdominal:     General: Abdomen is flat. Bowel sounds are normal. There is no distension.     Palpations: Abdomen is soft.     Tenderness: There is no abdominal tenderness. There is no guarding.  Musculoskeletal:     Right lower leg: No edema.     Left lower leg: No edema.  Skin:    General: Skin is warm and dry.     Coloration: Skin is pale.     Findings: No erythema.  Neurological:     Mental Status: She is alert.     Gait: Gait abnormal (The patient is ambulating with the use of a wheelchair.).  Psychiatric:        Behavior: Behavior normal.        Thought Content: Thought content normal.        Judgment: Judgment normal.     Lab Review:     Component Value Date/Time   NA 132 (L) 04/07/2018 1143   K 2.7 (LL) 04/07/2018 1143   CL 95 (L) 04/07/2018 1143   CO2 22 04/07/2018 1143   GLUCOSE 223 (H) 04/07/2018 1143   BUN 10 04/07/2018 1143   CREATININE 0.78 04/07/2018 1143   CREATININE 0.83 03/21/2018 1206   CALCIUM 8.7 (L) 04/07/2018 1143   PROT 7.4 04/07/2018 1143   ALBUMIN 3.7 04/07/2018 1143   AST 15 04/07/2018 1143   AST 15 03/21/2018 1206   ALT 13 04/07/2018 1143   ALT 10 03/21/2018 1206   ALKPHOS 92 04/07/2018 1143   BILITOT 1.5 (H)  04/07/2018 1143   BILITOT 0.6 03/21/2018 1206   GFRNONAA >60 04/07/2018 1143   GFRNONAA >60 03/21/2018 1206   GFRAA >60 04/07/2018 1143   GFRAA  >60 03/21/2018 1206       Component Value Date/Time   WBC 11.1 (H) 04/07/2018 1141   WBC 5.4 03/24/2018 0700   RBC 2.75 (L) 04/07/2018 1141   HGB 7.2 (L) 04/07/2018 1141   HCT 22.9 (L) 04/07/2018 1141   PLT 151 04/07/2018 1141   MCV 83.3 04/07/2018 1141   MCH 26.2 04/07/2018 1141   MCHC 31.4 04/07/2018 1141   RDW 25.4 (H) 04/07/2018 1141   LYMPHSABS 2.4 04/07/2018 1141   MONOABS 1.8 (H) 04/07/2018 1141   EOSABS 0.0 04/07/2018 1141   BASOSABS 0.6 (H) 04/07/2018 1141   -------------------------------  Imaging from last 24 hours (if applicable):  Radiology interpretation: Ct Chest W Contrast  Result Date: 03/11/2018 CLINICAL DATA:  Weight loss in thrombocytopenia. EXAM: CT CHEST, ABDOMEN, AND PELVIS WITH CONTRAST TECHNIQUE: Multidetector CT imaging of the chest, abdomen and pelvis was performed following the standard protocol during bolus administration of intravenous contrast. CONTRAST:  172mL OMNIPAQUE IOHEXOL 300 MG/ML SOLN, 67mL OMNIPAQUE IOHEXOL 300 MG/ML SOLN COMPARISON:  None. FINDINGS: CT CHEST FINDINGS Cardiovascular: The heart size is normal. No substantial pericardial effusion. Atherosclerotic calcification is noted in the wall of the thoracic aorta. Mediastinum/Nodes: 13 mm short axis precarinal lymph node mildly enlarged. 13 mm densely calcified subcarinal lymph node is associated with calcified nodal tissue in the right hilum. No left hilar lymphadenopathy. 9 mm short axis left axillary lymph node is associated with small additional left axillary and subpectoral nodes. No right hilar adenopathy. Lungs/Pleura: The central tracheobronchial airways are patent. 2.7 x 2.2 cm central right upper lobe nodule identified on 56/4. Numerous additional pulmonary nodules are identified bilaterally ranging in size from several mm up to 10 mm in the right upper lobe. These nodules have a slight upper lung predominance. Index 7 mm right lower lobe nodule identified on 64/4. No pleural effusion.  Calcified granuloma noted posterior right costophrenic sulcus. Musculoskeletal: No worrisome lytic or sclerotic osseous abnormality. Masslike asymmetry of left breast tissue identified in the medial inferior quadrant of the left breast (40/2) measuring 2.6 x 2.3 cm. Coronal imaging suggests that this lesion has spiculated margins (22/5). CT ABDOMEN PELVIS FINDINGS Hepatobiliary: Multiple (approximately 10) ill-defined liver lesions are concerning for metastases. Dominant lesions are a 3.6 cm lesion in the right liver (54/2) and a 3.8 cm lesion identified lateral segment left liver (58/2). Other lesions range in size from 8 mm up to 2.6 cm. There is no evidence for gallstones, gallbladder wall thickening, or pericholecystic fluid. No intrahepatic or extrahepatic biliary dilation. Pancreas: No focal mass lesion. No dilatation of the main duct. No intraparenchymal cyst. No peripancreatic edema. Spleen: Calcified granulomata. Adrenals/Urinary Tract: No adrenal nodule or mass. Kidneys unremarkable. Central sinus cysts noted in the left kidney. No evidence for hydroureter. The urinary bladder appears normal for the degree of distention. Stomach/Bowel: Stomach is unremarkable. No gastric wall thickening. No evidence of outlet obstruction. Duodenum is normally positioned as is the ligament of Treitz. No small bowel wall thickening. No small bowel dilatation. The terminal ileum is normal. The appendix is not visualized, but there is no edema or inflammation in the region of the cecum. No gross colonic mass. No colonic wall thickening. Vascular/Lymphatic: There is abdominal aortic atherosclerosis without aneurysm. There is no gastrohepatic or hepatoduodenal ligament lymphadenopathy. No intraperitoneal or retroperitoneal lymphadenopathy.  No pelvic sidewall lymphadenopathy. Reproductive: The uterus is unremarkable.  There is no adnexal mass. Other: No intraperitoneal free fluid. Musculoskeletal: No worrisome lytic or sclerotic  osseous abnormality. IMPRESSION: 1. 2.6 x 2.3 cm spiculated mass identified inferomedial quadrant of the left breast. Lesion appears to retract the overlying skin. Mammographic correlation recommended. 2. Small lymph nodes in the left axillar ill-defined and suspicious for metastatic disease. 3. Multiple bilateral pulmonary nodules measuring up to 2.7 cm diameter. These probably represent metastatic disease. Given the dominant size of the central right upper lobe lesion, synchronous lung primary can not be completely excluded. 4. Multiple ill-defined liver lesions compatible with metastatic disease. Dominant liver metastases measure up to almost 4 cm. 5.  Aortic Atherosclerois (ICD10-170.0) Electronically Signed   By: Misty Stanley M.D.   On: 03/11/2018 18:30   Mr Jeri Cos YH Contrast  Result Date: 03/17/2018 CLINICAL DATA:  Secondary malignant neoplasm of brain. SRS treatment planning. EXAM: MRI HEAD WITHOUT AND WITH CONTRAST TECHNIQUE: Multiplanar, multiecho pulse sequences of the brain and surrounding structures were obtained without and with intravenous contrast. CONTRAST:  9mL MULTIHANCE GADOBENATE DIMEGLUMINE 529 MG/ML IV SOLN COMPARISON:  MRI head 03/12/2018 FINDINGS: Brain: Solitary metastatic deposit in the right occipital lobe measures 16 x 13 mm. This measures slightly larger attributed to thinner sections on the current study. Small enhancing nodule is present immediately deep to the largest lesion. The lesions show central necrosis and is peripherally located. Punctate foci of mineralization likely hemorrhage or calcification within the lesion. Mild adjacent white matter edema. No other enhancing mass lesion identified. Mild meningeal enhancement is less impressive compared to the prior study. Ventricle size normal. No midline shift. Negative for acute infarct. Mild chronic ischemic changes in the white matter. Vascular: Normal arterial flow voids Skull and upper cervical spine: Cervical spine bone  marrow diffusely low signal intensity on T1. No focal lesions identified. Clivus also shows low signal bone marrow. Sinuses/Orbits: Normal orbit moderate mucosal edema paranasal sinuses none. Other: Solitary metastatic deposit IMPRESSION: Right occipital lobe 13 x 16 mm. Small satellite enhancing nodule deep to the larger lesion. The larger lesion shows central necrosis and probable mild hemorrhage or calcification. Mild dural enhancement is less impressive and could be within normal limits. Bone marrow in the clivus and cervical spine is diffusely low signal. No focal lesion. This may be related to the patient's anemia and abnormal blood count. Electronically Signed   By: Franchot Gallo M.D.   On: 03/17/2018 09:08   Mr Jeri Cos CW Contrast  Result Date: 03/12/2018 CLINICAL DATA:  Metastatic breast cancer.  Gait disturbance. EXAM: MRI HEAD WITHOUT AND WITH CONTRAST TECHNIQUE: Multiplanar, multiecho pulse sequences of the brain and surrounding structures were obtained without and with intravenous contrast. CONTRAST:  Gadavist 7 mL. COMPARISON:  None. FINDINGS: Brain: 13 x 14 x 13 mm metastasis, RIGHT lateral occipital lobe, mild surrounding edema. No midline shift. No other definite metastatic lesions. No acute stroke, hemorrhage hydrocephalus. Slight premature for age white matter disease. Other than the necrotic metastasis, no abnormal enhancement of the brain. Borderline pachymeningeal enhancement of a symmetric nature, could be related to the superficial metastasis. No leptomeningeal enhancement. Vascular: Normal flow voids. Skull and upper cervical spine: Diffusely abnormal marrow in the clivus and upper cervical region, concerning for metastatic disease. Sinuses/Orbits: No significant sinus disease.  Negative orbits. Other: None. IMPRESSION: Single 1.3 x 1.4 cm RIGHT occipital metastasis. Borderline pachymeningeal enhancement of symmetric nature could be related to the superficial metastasis or  osseous  disease. A call has been placed to the ordering provider. Electronically Signed   By: Staci Righter M.D.   On: 03/12/2018 13:28   Ct Abdomen Pelvis W Contrast  Result Date: 03/11/2018 CLINICAL DATA:  Weight loss in thrombocytopenia. EXAM: CT CHEST, ABDOMEN, AND PELVIS WITH CONTRAST TECHNIQUE: Multidetector CT imaging of the chest, abdomen and pelvis was performed following the standard protocol during bolus administration of intravenous contrast. CONTRAST:  165mL OMNIPAQUE IOHEXOL 300 MG/ML SOLN, 65mL OMNIPAQUE IOHEXOL 300 MG/ML SOLN COMPARISON:  None. FINDINGS: CT CHEST FINDINGS Cardiovascular: The heart size is normal. No substantial pericardial effusion. Atherosclerotic calcification is noted in the wall of the thoracic aorta. Mediastinum/Nodes: 13 mm short axis precarinal lymph node mildly enlarged. 13 mm densely calcified subcarinal lymph node is associated with calcified nodal tissue in the right hilum. No left hilar lymphadenopathy. 9 mm short axis left axillary lymph node is associated with small additional left axillary and subpectoral nodes. No right hilar adenopathy. Lungs/Pleura: The central tracheobronchial airways are patent. 2.7 x 2.2 cm central right upper lobe nodule identified on 56/4. Numerous additional pulmonary nodules are identified bilaterally ranging in size from several mm up to 10 mm in the right upper lobe. These nodules have a slight upper lung predominance. Index 7 mm right lower lobe nodule identified on 64/4. No pleural effusion. Calcified granuloma noted posterior right costophrenic sulcus. Musculoskeletal: No worrisome lytic or sclerotic osseous abnormality. Masslike asymmetry of left breast tissue identified in the medial inferior quadrant of the left breast (40/2) measuring 2.6 x 2.3 cm. Coronal imaging suggests that this lesion has spiculated margins (22/5). CT ABDOMEN PELVIS FINDINGS Hepatobiliary: Multiple (approximately 10) ill-defined liver lesions are concerning for  metastases. Dominant lesions are a 3.6 cm lesion in the right liver (54/2) and a 3.8 cm lesion identified lateral segment left liver (58/2). Other lesions range in size from 8 mm up to 2.6 cm. There is no evidence for gallstones, gallbladder wall thickening, or pericholecystic fluid. No intrahepatic or extrahepatic biliary dilation. Pancreas: No focal mass lesion. No dilatation of the main duct. No intraparenchymal cyst. No peripancreatic edema. Spleen: Calcified granulomata. Adrenals/Urinary Tract: No adrenal nodule or mass. Kidneys unremarkable. Central sinus cysts noted in the left kidney. No evidence for hydroureter. The urinary bladder appears normal for the degree of distention. Stomach/Bowel: Stomach is unremarkable. No gastric wall thickening. No evidence of outlet obstruction. Duodenum is normally positioned as is the ligament of Treitz. No small bowel wall thickening. No small bowel dilatation. The terminal ileum is normal. The appendix is not visualized, but there is no edema or inflammation in the region of the cecum. No gross colonic mass. No colonic wall thickening. Vascular/Lymphatic: There is abdominal aortic atherosclerosis without aneurysm. There is no gastrohepatic or hepatoduodenal ligament lymphadenopathy. No intraperitoneal or retroperitoneal lymphadenopathy. No pelvic sidewall lymphadenopathy. Reproductive: The uterus is unremarkable.  There is no adnexal mass. Other: No intraperitoneal free fluid. Musculoskeletal: No worrisome lytic or sclerotic osseous abnormality. IMPRESSION: 1. 2.6 x 2.3 cm spiculated mass identified inferomedial quadrant of the left breast. Lesion appears to retract the overlying skin. Mammographic correlation recommended. 2. Small lymph nodes in the left axillar ill-defined and suspicious for metastatic disease. 3. Multiple bilateral pulmonary nodules measuring up to 2.7 cm diameter. These probably represent metastatic disease. Given the dominant size of the central  right upper lobe lesion, synchronous lung primary can not be completely excluded. 4. Multiple ill-defined liver lesions compatible with metastatic disease. Dominant liver metastases measure up to  almost 4 cm. 5.  Aortic Atherosclerois (ICD10-170.0) Electronically Signed   By: Misty Stanley M.D.   On: 03/11/2018 18:30   US Biopsy (liver)  Result Date: 03/12/2018 INDICATION: Multiple liver masses, lung lesions and left breast mass. The patient presents for biopsy of a liver lesion. EXAM: ULTRASOUND GUIDED CORE BIOPSY OF LIVER MEDICATIONS: None. ANESTHESIA/SEDATION: Fentanyl 100 mcg IV; Versed 2.0 mg IV Moderate Sedation Time:  20 minutes. The patient was continuously monitored during the procedure by the interventional radiology nurse under my direct supervision. PROCEDURE: The procedure, risks, benefits, and alternatives were explained to the patient. Questions regarding the procedure were encouraged and answered. The patient understands and consents to the procedure. A time-out was performed prior to initiating the procedure. Initial ultrasound was performed to localize liver lesions. The epigastric region was prepped with chlorhexidine in a sterile fashion, and a sterile drape was applied covering the operative field. A sterile gown and sterile gloves were used for the procedure. Local anesthesia was provided with 1% Lidocaine. Under ultrasound guidance, a 17 gauge trocar needle was advanced into a lesion within the left lobe of the liver. After confirming needle tip position, 3 coaxial 18 gauge core biopsy samples were obtained. Material was submitted in formalin. After the procedure Gel-Foam pledgets were advanced through the outer needle as the needle was retracted. COMPLICATIONS: None immediate. 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 The largest and most accessible by ultrasound was within the lateral segment of the left lobe and measures just over 3.5 cm in estimated  maximal diameter. Two intact solid core biopsy samples were obtained. The third throw of the biopsy device did not yield much tissue. There were no immediate bleeding complications. IMPRESSION: Ultrasound-guided core biopsy performed of a mass within the lateral segment of the left lobe of the liver. Electronically Signed   By: Aletta Edouard M.D.   On: 03/12/2018 16:43   Dg Chest Port 1 View  Result Date: 04/07/2018 CLINICAL DATA:  Breast cancer with metastases.  Fever. EXAM: PORTABLE CHEST 1 VIEW COMPARISON:  CT scan March 11, 2018 FINDINGS: There is an oval hazy opacity in the right upper lobe which correlates with the site of a previously identified nodule, likely a metastatic lesion. No other definitive pulmonary nodules. The heart, hila, and mediastinum are unremarkable. No pneumothorax. The right Port-A-Cath terminates in the SVC. No other acute abnormalities. IMPRESSION: 1. The ill-defined hazy rounded opacity in the right upper lobe correlates with a previously identified nodule thought to be a metastatic lesion. No other abnormalities identified on today's study. Electronically Signed   By: Dorise Bullion III M.D   On: 04/07/2018 16:54   Ir Imaging Guided Port Insertion  Result Date: 03/24/2018 INDICATION: 58 year old female with metastatic breast cancer. She presents for placement of a portacatheter to provide durable venous access for palliative chemotherapy. EXAM: IMPLANTED PORT A CATH PLACEMENT WITH ULTRASOUND AND FLUOROSCOPIC GUIDANCE MEDICATIONS: 2 g Ancef; The antibiotic was administered within an appropriate time interval prior to skin puncture. ANESTHESIA/SEDATION: Versed 2 mg IV; Fentanyl 180 mcg IV; Moderate Sedation Time:  24 minutes The patient was continuously monitored during the procedure by the interventional radiology nurse under my direct supervision. FLUOROSCOPY TIME:  1 minutes, 0 seconds (3 mGy) COMPLICATIONS: None immediate. PROCEDURE: The right neck and chest was prepped  with chlorhexidine, and draped in the usual sterile fashion using maximum barrier technique (cap and mask, sterile gown, sterile gloves, large sterile sheet, hand hygiene and cutaneous  antiseptic). Local anesthesia was attained by infiltration with 1% lidocaine with epinephrine. Ultrasound demonstrated patency of the right internal jugular vein, and this was documented with an image. Under real-time ultrasound guidance, this vein was accessed with a 21 gauge micropuncture needle and image documentation was performed. A small dermatotomy was made at the access site with an 11 scalpel. A 0.018" wire was advanced into the SVC and the access needle exchanged for a 36F micropuncture vascular sheath. The 0.018" wire was then removed and a 0.035" wire advanced into the IVC. An appropriate location for the subcutaneous reservoir was selected below the clavicle and an incision was made through the skin and underlying soft tissues. The subcutaneous tissues were then dissected using a combination of blunt and sharp surgical technique and a pocket was formed. A single lumen power injectable portacatheter was then tunneled through the subcutaneous tissues from the pocket to the dermatotomy and the port reservoir placed within the subcutaneous pocket. The venous access site was then serially dilated and a peel away vascular sheath placed over the wire. The wire was removed and the port catheter advanced into position under fluoroscopic guidance. The catheter tip is positioned in the superior cavoatrial junction. This was documented with a spot image. The portacatheter was then tested and found to flush and aspirate well. The port was flushed with saline followed by 100 units/mL heparinized saline. The pocket was then closed in two layers using first subdermal inverted interrupted absorbable sutures followed by a running subcuticular suture. The epidermis was then sealed with Dermabond. The dermatotomy at the venous access site was  also closed with Dermabond. IMPRESSION: Successful placement of a right IJ approach Power Port with ultrasound and fluoroscopic guidance. The catheter is ready for use. Signed, Criselda Peaches, MD, [name] Electronically Signed   By: Jacqulynn Cadet M.D.   On: 03/24/2018 09:29        This case was discussed with Dr. Alvy Bimler. She expresses agreement with my management of this patient.

## 2018-04-07 NOTE — Telephone Encounter (Signed)
Pt to be seen in [facility] today.  High priority scheduling message sent. Lab orders sent.  TCT pt's sister and advised of the same. A friend will be bringing pt to cancer center this morning.

## 2018-04-07 NOTE — Progress Notes (Signed)
TWO SETS OF BLOOD CULTURES DRAWN BEFORE IV ABX STARTED.  Pt taken to ED room 21 via w/c with belongings.  Report given to RN Watt Climes in ED.

## 2018-04-07 NOTE — Telephone Encounter (Signed)
Received TC from pt's sister, Stacy Shelton. She states that her sister started having nausea, vomiting and diarrhea last night. Unable to keep anything down. Compazine has not been effective.  Pt now has a fever of 101.4 this morning. Stacy Shelton would like like further direction on what to do.  Pt's mother is with the patient but is 58 years old and per Stacy Shelton, is not "handling things well".  Please advise.

## 2018-04-07 NOTE — H&P (Signed)
Stacy Shelton is an 58 y.o. female.   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 The patient was sent to the ED from the clinic where she was given Levaquin after blood cultures and UA were obtained. She admitted to some associated nausea however there was no abdominal pain. While in the ED she did saturate into the 80s required nasal cannula. The patient denied being short of breath at home. There has not been travel at least since her last chemotherapy treatment 03/26/18. In the ED she was found hypotensive and given volume but her pressure did not sustain and she progressed to requiring pressors.  Neosynephrine was started in the ICU along with units of PRBCs for a hemoglobin of 5.7.   Past Medical History  Metastatic Breat Cancer  Past Surgical History:  Procedure Laterality Date  . IR IMAGING GUIDED PORT INSERTION  03/24/2018  . KIDNEY STONE SURGERY    . SINOSCOPY    . WISDOM TOOTH EXTRACTION     Social History    She reports that she has never smoked.  She has never used smokeless tobacco.  She reports current alcohol use.  She reports that she does not use drugs.  Family History   Her family history is negative for Allergic rhinitis, Angioedema, Asthma, Atopy, Eczema, Immunodeficiency, and Urticaria.   Allergies  Allergen Reactions  . Nsaids Anaphylaxis   Home Medications  Prior to Admission medications   Medication  Sig Start Date End Date Taking? Authorizing Provider  albuterol (PROAIR HFA) 108 (90 Base) MCG/ACT inhaler Inhale 2 puffs into the lungs every 6 (six) hours as needed for wheezing or shortness of breath. 05/16/17  Yes Padgett, Rae Halsted, MD  dexamethasone (DECADRON) 4 MG tablet Take 1 tablet in the morning before chemo, none on the day of chemo and daily in the morning after chemo for 2 days, with food 03/21/18  Yes Alvy Bimler, Ni, MD  esomeprazole (NEXIUM) 40 MG capsule Take 1 capsule (40 mg total) by mouth 2 (two) times daily before a meal. 05/16/17  Yes Padgett, Rae Halsted, MD  fluticasone (FLOVENT HFA) 44 MCG/ACT inhaler Inhale 2 puffs into the lungs 2 (two) times daily. 05/16/17  Yes Padgett, Rae Halsted, MD  insulin aspart (NOVOLOG) 100 UNIT/ML FlexPen Inject 8 Units into the skin 3 (three) times daily with meals. 03/13/18  Yes Donne Hazel, MD  Insulin Detemir (LEVEMIR) 100 UNIT/ML Pen Inject 20 Units into the skin daily. 03/13/18  Yes Donne Hazel, MD  lidocaine-prilocaine (EMLA) cream Apply to affected area once 03/21/18  Yes [name], MD  LORazepam (ATIVAN) 0.5 MG tablet Take 1 tab po 30 minutes prior to radiation or MRI 03/14/18  Yes Hayden Pedro, PA-C  metFORMIN (GLUCOPHAGE) 500 MG tablet Take 1 tablet (500 mg total) by mouth 2 (two) times daily with a meal. 03/17/18  Yes [name], MD  metoprolol succinate (  TOPROL-XL) 50 MG 24 hr tablet Take 50 mg by mouth every evening. 03/10/18  Yes [provider]  montelukast (SINGULAIR) 10 MG tablet TAKE 1 TABLET BY MOUTH EVERYDAY AT BEDTIME Patient taking differently: Take 10 mg by mouth daily.  08/12/17  Yes Padgett, Rae Halsted, MD  Multiple Vitamin (MULTIVITAMIN WITH MINERALS) TABS tablet Take 1 tablet by mouth daily.   Yes [provider]  ranitidine (ZANTAC) 300 MG tablet Take 1 tablet (300 mg total) by mouth at bedtime. Patient taking differently: Take 300 mg by mouth at bedtime as needed for  heartburn.  05/16/17  Yes Kennith Gain, MD  ACCU-CHEK AVIVA PLUS test strip  03/13/18   [provider]  ACCU-CHEK SOFTCLIX LANCETS lancets  03/13/18   [provider]  blood glucose meter kit and supplies KIT Dispense based on patient and insurance preference. Use up to four times daily as directed. (FOR ICD-9 250.00, 250.01). 03/13/18   Donne Hazel, MD  Insulin Pen Needle 31G X 5 MM MISC 1 Device by Does not apply route QID. For use with insulin pens 03/13/18   Donne Hazel, MD  ondansetron (ZOFRAN) 8 MG tablet Take 1 tablet (8 mg total) by mouth every 8 (eight) hours as needed for refractory nausea / vomiting. Patient not taking: Reported on 04/07/2018 03/21/18   [name], MD  prochlorperazine (COMPAZINE) 10 MG tablet Take 1 tablet (10 mg total) by mouth every 6 (six) hours as needed (Nausea or vomiting). Patient not taking: Reported on 04/07/2018 03/21/18   [name], MD    Review of Systems   Constitutional: Positive for fever and malaise/fatigue. Negative for chills and diaphoresis.  HENT: Negative for hearing loss and sinus pain.   Eyes: Negative for blurred vision and double vision.  Respiratory: Positive for cough. Negative for hemoptysis and shortness of breath.   Cardiovascular: Negative for chest pain, palpitations, orthopnea and leg swelling.  Gastrointestinal: Positive for diarrhea and nausea. Negative for abdominal pain, blood in stool and vomiting.  Genitourinary: Negative for dysuria, frequency, hematuria and urgency.  Musculoskeletal: Negative for back pain.  Skin: Negative for itching and rash.  Neurological: Negative for dizziness and headaches.      Objective     Labs   CBC: Recent Labs  Lab 04/07/18 1141 04/07/18 2042  WBC 11.1* 7.5  NEUTROABS 4.2 PENDING  HGB 7.2* 5.7*  HCT 22.9* 18.4*  MCV 83.3 86.0  PLT 151 109*    Basic Metabolic Panel: Recent Labs  Lab 04/07/18 1143 04/07/18 2042  NA 132* 135  133*  K 2.7*  3.0*  2.9*  CL 95* 106  106  CO2 22 17*  18*  GLUCOSE 223* 262*  258*  BUN _0 CREATININE 0.78 0.83  0.82  CALCIUM 8.7* 7.5*  7.3*  MG 1.3* 1.9  PHOS  --  2.0*   GFR: Estimated Creatinine Clearance: 66.4 mL/min (by C-G formula based on SCr of 0.83 mg/dL). Recent Labs  Lab 04/07/18 1141 04/07/18 1626 04/07/18 2042  PROCALCITON  --   --  14.25  WBC 11.1*  --  7.5  LATICACIDVEN  --  1.9 1.0    Liver Function Tests: Recent Labs  Lab 04/07/18 1143 04/07/18 2042  AST 15 11*  ALT 13 12  ALKPHOS 92 64  BILITOT 1.5* 1.8*  PROT 7.4 5.7*  ALBUMIN 3.7 2.6*   No results for input(s): LIPASE, AMYLASE in the last 168 hours. No results for input(s):  AMMONIA in the last 168 hours.  ABG    Component Value Date/Time   PHART 7.440 03/11/2018 0713   PCO2ART 33.1 03/11/2018 0713   PO2ART 225 (H) 03/11/2018 0713   HCO3 22.1 03/11/2018 0713   ACIDBASEDEF 1.2 03/11/2018 0713   O2SAT 99.9 03/11/2018 0713     Coagulation Profile: Recent Labs  Lab 04/07/18 2042  INR 1.6*    Cardiac Enzymes: No results for input(s): CKTOTAL, CKMB, CKMBINDEX, TROPONINI in the last 168 hours.  HbA1C: Hgb A1c MFr Bld  Date/Time Value Ref Range Status  03/10/2018 11:50 PM 12.9 (H) 4.8 - 5.6 % Final    Comment:    (NOTE) Pre diabetes:          5.7%-6.4% Diabetes:              >6.4% Glycemic control for   <7.0% adults with diabetes     CBG: Recent Labs  Lab 04/07/18 2117 04/07/18 2304  GLUCAP 259* 215*       Results for orders placed or performed during the hospital encounter of 04/07/18 (from the past 48 hour(s))  Prepare RBC     Status: None   Collection Time: 04/07/18  1:10 PM  Result Value Ref Range   Order Confirmation      ORDER PROCESSED BY BLOOD BANK Performed at [facility], [place] [address] [place], [place] [address]   Type and screen [place]     Status: None (Preliminary result)   Collection Time: 04/07/18  1:10  PM  Result Value Ref Range   ABO/RH(D) A POS    Antibody Screen NEG    Sample Expiration 04/10/2018    Unit Number [id number]    Blood Component Type RED CELLS,LR    Unit division 00    Status of Unit ALLOCATED    Transfusion Status OK TO TRANSFUSE    Crossmatch Result Compatible    Unit Number [id number]    Blood Component Type RBC LR PHER1    Unit division 00    Status of Unit ISSUED    Transfusion Status OK TO TRANSFUSE    Crossmatch Result      Compatible Performed at [facility], [place] [address], [place], [place] [address]   Lactic acid, plasma     Status: None   Collection Time: 04/07/18  4:26 PM  Result Value Ref Range   Lactic Acid, Venous 1.9 0.5 - 1.9 mmol/L    Comment: Performed at [facility], [place] [address] [place], [place] [address]  MRSA PCR Screening     Status: None   Collection Time: 04/07/18  8:41 PM  Result Value Ref Range   MRSA by PCR NEGATIVE NEGATIVE    Comment:        The GeneXpert MRSA Assay (FDA approved for NASAL specimens only), is one component of a comprehensive MRSA colonization surveillance program. It is not intended to diagnose MRSA infection nor to guide or monitor treatment for MRSA infections. Performed at [facility], [place] [address], [place], [place] [address]   Basic metabolic panel     Status: Abnormal   Collection Time: 04/07/18  8:42 PM  Result Value Ref Range   Sodium 135 135 - 145 mmol/L   Potassium 3.0 (L) 3.5 - 5.1 mmol/L   Chloride 106 98 - 111 mmol/L   CO2 17 (L) 22 - 32 mmol/L   Glucose, Bld 262 (H) 70 - 99 mg/dL  BUN 12 6 - 20 mg/dL   Creatinine, Ser 0.83 0.44 - 1.00 mg/dL   Calcium 7.5 (L) 8.9 - 10.3 mg/dL   GFR calc non Af Amer >60 >60 mL/min   GFR calc Af Amer >60 >60 mL/min   Anion gap 12 5 - 15    Comment: Performed at [facility], [place] [address], [place], [place] [address]  Magnesium     Status: None   Collection Time:  04/07/18  8:42 PM  Result Value Ref Range   Magnesium 1.9 1.7 - 2.4 mg/dL    Comment: Performed at [facility], [place] [address] [place], [place] [address]  Phosphorus     Status: Abnormal   Collection Time: 04/07/18  8:42 PM  Result Value Ref Range   Phosphorus 2.0 (L) 2.5 - 4.6 mg/dL    Comment: Performed at [facility], [place] [address] [place],  [address]  CBC with Differential/Platelet     Status: Abnormal (Preliminary result)   Collection Time: 04/07/18  8:42 PM  Result Value Ref Range   WBC 7.5 4.0 - 10.5 K/uL   RBC 2.14 (L) 3.87 - 5.11 MIL/uL   Hemoglobin 5.7 (LL) 12.0 - 15.0 g/dL    Comment: REPEATED TO VERIFY THIS CRITICAL RESULT HAS VERIFIED AND BEEN CALLED TO A.HEAVNER BY NATHAN THOMPSON ON 03 09 2020 AT 2206, AND HAS BEEN READ BACK. CRITICAL RESULT VERIFIED    HCT 18.4 (L) 36.0 - 46.0 %   MCV 86.0 80.0 - 100.0 fL   MCH 26.6 26.0 - 34.0 pg   MCHC 31.0 30.0 - 36.0 g/dL   RDW 24.5 (H) 11.5 - 15.5 %   Platelets 109 (L) 150 - 400 K/uL    Comment: REPEATED TO VERIFY PLATELET COUNT CONFIRMED BY SMEAR SPECIMEN CHECKED FOR CLOTS Immature Platelet Fraction may be clinically indicated, consider ordering this additional test [id number]    nRBC 6.4 (H) 0.0 - 0.2 %    Comment: Performed at [facility], [place] [address] [place], [place] [address]   Neutrophils Relative % PENDING %   Neutro Abs PENDING 1.7 - 7.7 K/uL   Band Neutrophils PENDING %   Lymphocytes Relative PENDING %   Lymphs Abs PENDING 0.7 - 4.0 K/uL   Monocytes Relative PENDING %   Monocytes Absolute PENDING 0.1 - 1.0 K/uL   Eosinophils Relative PENDING %   Eosinophils Absolute PENDING 0.0 - 0.5 K/uL   Basophils Relative PENDING %   Basophils Absolute PENDING 0.0 - 0.1 K/uL   WBC Morphology PENDING    RBC Morphology PENDING    Smear Review PENDING    Other PENDING %   nRBC PENDING 0 /100 WBC   Metamyelocytes Relative PENDING %   Myelocytes  PENDING %   Promyelocytes Relative PENDING %   Blasts PENDING %  Comprehensive metabolic panel     Status: Abnormal   Collection Time: 04/07/18  8:42 PM  Result Value Ref Range   Sodium 133 (L) 135 - 145 mmol/L   Potassium 2.9 (L) 3.5 - 5.1 mmol/L   Chloride 106 98 - 111 mmol/L   CO2 18 (L) 22 - 32 mmol/L   Glucose, Bld 258 (H) 70 - 99 mg/dL   BUN 11 6 - 20 mg/dL   Creatinine, Ser 0.82 0.44 - 1.00 mg/dL   Calcium 7.3 (L) 8.9 - 10.3 mg/dL   Total Protein 5.7 (L) 6.5 - 8.1 g/dL   Albumin 2.6 (L) 3.5 -  5.0 g/dL   AST 11 (L) 15 - 41 U/L   ALT 12 0 - 44 U/L   Alkaline Phosphatase 64 38 - 126 U/L   Total Bilirubin 1.8 (H) 0.3 - 1.2 mg/dL   GFR calc non Af Amer >60 >60 mL/min   GFR calc Af Amer >60 >60 mL/min   Anion gap 9 5 - 15    Comment: Performed at [facility], [place] [address] [place], [place] [address]  Lactic acid, plasma     Status: None   Collection Time: 04/07/18  8:42 PM  Result Value Ref Range   Lactic Acid, Venous 1.0 0.5 - 1.9 mmol/L    Comment: Performed at [facility], [place] [address] [place], [place] [address]  Procalcitonin     Status: None   Collection Time: 04/07/18  8:42 PM  Result Value Ref Range   Procalcitonin 14.25 ng/mL    Comment:        Interpretation: PCT >= 10 ng/mL: Important systemic inflammatory response, almost exclusively due to severe bacterial sepsis or septic shock. (NOTE)       Sepsis PCT Algorithm           Lower Respiratory Tract                                      Infection PCT Algorithm    ----------------------------     ----------------------------         PCT < 0.25 ng/mL                PCT < 0.10 ng/mL         Strongly encourage             Strongly discourage   discontinuation of antibiotics    initiation of antibiotics    ----------------------------     -----------------------------       PCT 0.25 - 0.50 ng/mL            PCT 0.10 - 0.25 ng/mL               OR       >80% decrease in  PCT            Discourage initiation of                                            antibiotics      Encourage discontinuation           of antibiotics    ----------------------------     -----------------------------         PCT >= 0.50 ng/mL              PCT 0.26 - 0.50 ng/mL                AND       <80% decrease in PCT             Encourage initiation of                                             antibiotics       Encourage continuation  of antibiotics    ----------------------------     -----------------------------        PCT >= 0.50 ng/mL                  PCT > 0.50 ng/mL               AND         increase in PCT                  Strongly encourage                                      initiation of antibiotics    Strongly encourage escalation           of antibiotics                                     -----------------------------                                           PCT <= 0.25 ng/mL                                                 OR                                        > 80% decrease in PCT                                     Discontinue / Do not initiate                                             antibiotics Performed at [place] [address], [place], [place] [address]   Protime-INR     Status: Abnormal   Collection Time: 04/07/18  8:42 PM  Result Value Ref Range   Prothrombin Time 18.8 (H) 11.4 - 15.2 seconds   INR 1.6 (H) 0.8 - 1.2    Comment: (NOTE) INR goal varies based on device and disease states. Performed at [facility], [place] [address], [place], [place] [address]   APTT     Status: Abnormal   Collection Time: 04/07/18  8:42 PM  Result Value Ref Range   aPTT 45 (H) 24 - 36 seconds    Comment:        IF BASELINE aPTT IS ELEVATED, SUGGEST PATIENT RISK ASSESSMENT BE USED TO DETERMINE APPROPRIATE ANTICOAGULANT THERAPY. Performed at [facility], [place] [address]  [place],  [address]   Glucose, capillary     Status: Abnormal   Collection Time: 04/07/18  9:17 PM  Result Value Ref Range   Glucose-Capillary 259 (H) 70 - 99 mg/dL  Glucose, capillary     Status:  Abnormal   Collection Time: 04/07/18 11:04 PM  Result Value Ref Range   Glucose-Capillary 215 (H) 70 - 99 mg/dL   Dg Chest Port 1 View  Result Date: 04/07/2018 CLINICAL DATA:  Breast cancer with metastases.  Fever. EXAM: PORTABLE CHEST 1 VIEW COMPARISON:  CT scan March 11, 2018 FINDINGS: There is an oval hazy opacity in the right upper lobe which correlates with the site of a previously identified nodule, likely a metastatic lesion. No other definitive pulmonary nodules. The heart, hila, and mediastinum are unremarkable. No pneumothorax. The right Port-A-Cath terminates in the SVC. No other acute abnormalities. IMPRESSION: 1. The ill-defined hazy rounded opacity in the right upper lobe correlates with a previously identified nodule thought to be a metastatic lesion. No other abnormalities identified on today's study. Electronically Signed   By: Dorise Bullion III M.D   On: 04/07/2018 16:54    Blood pressure (!) 80/44, pulse (!) 107, temperature 99 F (37.2 C), temperature source Oral, resp. rate (!) 29, height _0  (1.575 m), weight 67.1 kg, last menstrual period 10/30/2010, SpO2 99 %. Physical Exam  Constitutional: She is oriented to person, place, and time. She appears well-developed. No distress. She is not intubated.  HENT:  Head: Normocephalic.  Mouth/Throat: Oropharynx is clear and moist.  Eyes: Pupils are equal, round, and reactive to light. Conjunctivae and EOM are normal.  Neck: No JVD present. No tracheal deviation present.  Cardiovascular: Normal rate and regular rhythm.  Occasional extrasystoles are present. Exam reveals decreased pulses.  Murmur heard.  Systolic murmur is present with a grade of 1/6. Respiratory: No accessory muscle usage. No tachypnea. She is not intubated. No  respiratory distress. She has decreased breath sounds in the right lower field and the left lower field.  GI: Normal appearance and bowel sounds are normal. She exhibits distension. There is no abdominal tenderness. There is no rigidity, no rebound and no guarding.  Musculoskeletal:     Comments: Trace LE edema  Neurological: She is alert and oriented to person, place, and time. No cranial nerve deficit or sensory deficit. GCS eye subscore is 4. GCS verbal subscore is 5. GCS motor subscore is 6.  Skin: She is not diaphoretic.   Significant Hospital Events     Consults:    Procedures:    Significant Diagnostic Tests:    Micro Data:    Antimicrobials:    Interim history/subjective:       Assessment/Plan  Metastatic Breast Cancer Septic Shock  Diarrhea Fever Anemia  The patient is admitted to the ICU 2 units of PRBCs are ordered Crystalline volume replete in ED Her MAP is now over 60 on neosynephrine via port Trend lactic acid presently controlled at 1.0 Continue with antibiotics: cefepime and vanc and flagyl as ordered by heme/onc Will address antibiosis pending cultures GI vs Urine as source Send stool for evaluation if diarrhea continues  Presently not requiring more than [place] oxygen   Best practice:  Diet: clears for now Pain/Anxiety/Delirium protocol (if indicated): GCS 15 pain control only VAP protocol (if indicated):-- DVT prophylaxis: SCD on my exam GI prophylaxis: H2B if indicated Glucose control: Insulin Mobility: as toerated Code Status: FC Family Communication: Not at bedside Disposition: ICU for pressors   Resolved Hospital Problem list     Critical care time: 69mn      DNelle Don MD 04/07/2018, 11:44 PM

## 2018-04-07 NOTE — ED Notes (Signed)
Bed: WA21 Expected date:  Expected time:  Means of arrival:  Comments: Cancer

## 2018-04-07 NOTE — Patient Instructions (Signed)
Hypokalemia Hypokalemia means that the amount of potassium in the blood is lower than normal.Potassium is a chemical that helps regulate the amount of fluid in the body (electrolyte). It also stimulates muscle tightening (contraction) and helps nerves work properly.Normally, most of the body's potassium is inside of cells, and only a very small amount is in the blood. Because the amount in the blood is so small, minor changes to potassium levels in the blood can be life-threatening. What are the causes? This condition may be caused by:  Antibiotic medicine.  Diarrhea or vomiting. Taking too much of a medicine that helps you have a bowel movement (laxative) can cause diarrhea and lead to hypokalemia.  Chronic kidney disease (CKD).  Medicines that help the body get rid of excess fluid (diuretics).  Eating disorders, such as bulimia.  Low magnesium levels in the body.  Sweating a lot. What are the signs or symptoms? Symptoms of this condition include:  Weakness.  Constipation.  Fatigue.  Muscle cramps.  Mental confusion.  Skipped heartbeats or irregular heartbeat (palpitations).  Tingling or numbness. How is this diagnosed? This condition is diagnosed with a blood test. How is this treated? Hypokalemia can be treated by taking potassium supplements by mouth or adjusting the medicines that you take. Treatment may also include eating more foods that contain a lot of potassium. If your potassium level is very low, you may need to get potassium through an IV tube in one of your veins and be monitored in the hospital. Follow these instructions at home:   Take over-the-counter and prescription medicines only as told by your health care provider. This includes vitamins and supplements.  Eat a healthy diet. A healthy diet includes fresh fruits and vegetables, whole grains, healthy fats, and lean proteins.  If instructed, eat more foods that contain a lot of potassium, such as: ?  Nuts, such as peanuts and pistachios. ? Seeds, such as sunflower seeds and pumpkin seeds. ? Peas, lentils, and lima beans. ? Whole grain and bran cereals and breads. ? Fresh fruits and vegetables, such as apricots, avocado, bananas, cantaloupe, kiwi, oranges, tomatoes, asparagus, and potatoes. ? Orange juice. ? Tomato juice. ? Red meats. ? Yogurt.  Keep all follow-up visits as told by your health care provider. This is important. Contact a health care provider if:  You have weakness that gets worse.  You feel your heart pounding or racing.  You vomit.  You have diarrhea.  You have diabetes (diabetes mellitus) and you have trouble keeping your blood sugar (glucose) in your target range. Get help right away if:  You have chest pain.  You have shortness of breath.  You have vomiting or diarrhea that lasts for more than 2 days.  You faint. This information is not intended to replace advice given to you by your health care provider. Make sure you discuss any questions you have with your health care provider. Document Released: 01/15/2005 Document Revised: 09/03/2015 Document Reviewed: 09/03/2015 Elsevier Interactive Patient Education  2019 Else[employer] Inc.   Hypomagnesemia Hypomagnesemia is a condition in which the level of magnesium in the blood is low. Magnesium is a mineral that is found in many foods. It is used in many different processes in the body. Hypomagnesemia can affect every organ in the body. In severe cases, it can cause life-threatening problems. What are the causes? This condition may be caused by:  Not getting enough magnesium in your diet.  Malnutrition.  Problems with absorbing magnesium from the intestines.    Dehydration.  Alcohol abuse.  Vomiting.  Severe or chronic diarrhea.  Some medicines, including medicines that make you urinate more (diuretics).  Certain diseases, such as kidney disease, diabetes, celiac disease, and overactive thyroid.  What are the signs or symptoms? Symptoms of this condition include:  Loss of appetite.  Nausea and vomiting.  Involuntary shaking or trembling of a body part (tremor).  Muscle weakness.  Tingling in the arms and legs.  Sudden tightening of muscles (muscle spasms).  Confusion.  Psychiatric issues, such as depression, irritability, or psychosis.  A feeling of fluttering of the heart.  Seizures. These symptoms are more severe if magnesium levels drop suddenly. How is this diagnosed? This condition may be diagnosed based on:  Your symptoms and medical history.  A physical exam.  Blood and urine tests. How is this treated? Treatment depends on the cause and the severity of the condition. It may be treated with:  A magnesium supplement. This can be taken in pill form. If the condition is severe, magnesium is usually given through an IV.  Changes to your diet. You may be directed to eat foods that have a lot of magnesium, such as green leafy vegetables, peas, beans, and nuts.  Stopping any intake of alcohol. Follow these instructions at home:      Make sure that your diet includes foods with magnesium. Foods that have a lot of magnesium in them include: ? Green leafy vegetables, such as spinach and broccoli. ? Beans and peas. ? Nuts and seeds, such as almonds and sunflower seeds. ? Whole grains, such as whole grain bread and fortified cereals.  Take magnesium supplements if your health care provider tells you to do that. Take them as directed.  Take over-the-counter and prescription medicines only as told by your health care provider.  Have your magnesium levels monitored as told by your health care provider.  When you are active, drink fluids that contain electrolytes.  Avoid drinking alcohol.  Keep all follow-up visits as told by your health care provider. This is important. Contact a health care provider if:  You get worse instead of better.  Your  symptoms return. Get help right away if you:  Develop severe muscle weakness.  Have trouble breathing.  Feel that your heart is racing. Summary  Hypomagnesemia is a condition in which the level of magnesium in the blood is low.  Hypomagnesemia can affect every organ in the body.  Treatment may include eating more foods that contain magnesium, taking magnesium supplements, and not drinking alcohol.  Have your magnesium levels monitored as told by your health care provider. This information is not intended to replace advice given to you by your health care provider. Make sure you discuss any questions you have with your health care provider. Document Released: 10/11/2004 Document Revised: 12/17/2016 Document Reviewed: 12/17/2016 Elsevier Interactive Patient Education  2019 Elsevier Inc.    

## 2018-04-07 NOTE — ED Triage Notes (Signed)
Pt from [facility].  Pt hx of breast CA with mets to brain, liver, and bone. PT came into CA center with fever (101.8).  Pt dx with UTI there.  Blood cultures and labs drawn there, and port accessed.  Pt given 1 round of Levaquin there and is receiving fluids with potassium and magnesium. Pt's last chemo tx was 2/26.

## 2018-04-08 ENCOUNTER — Inpatient Hospital Stay (HOSPITAL_COMMUNITY): Payer: BLUE CROSS/BLUE SHIELD

## 2018-04-08 ENCOUNTER — Telehealth: Payer: Self-pay | Admitting: *Deleted

## 2018-04-08 DIAGNOSIS — C7931 Secondary malignant neoplasm of brain: Secondary | ICD-10-CM

## 2018-04-08 DIAGNOSIS — C7801 Secondary malignant neoplasm of right lung: Secondary | ICD-10-CM | POA: Diagnosis present

## 2018-04-08 DIAGNOSIS — C7951 Secondary malignant neoplasm of bone: Secondary | ICD-10-CM | POA: Diagnosis present

## 2018-04-08 DIAGNOSIS — C50919 Malignant neoplasm of unspecified site of unspecified female breast: Secondary | ICD-10-CM | POA: Diagnosis not present

## 2018-04-08 DIAGNOSIS — D61818 Other pancytopenia: Secondary | ICD-10-CM

## 2018-04-08 DIAGNOSIS — E876 Hypokalemia: Secondary | ICD-10-CM | POA: Diagnosis present

## 2018-04-08 DIAGNOSIS — E1165 Type 2 diabetes mellitus with hyperglycemia: Secondary | ICD-10-CM

## 2018-04-08 DIAGNOSIS — Z7951 Long term (current) use of inhaled steroids: Secondary | ICD-10-CM | POA: Diagnosis not present

## 2018-04-08 DIAGNOSIS — C50912 Malignant neoplasm of unspecified site of left female breast: Secondary | ICD-10-CM | POA: Diagnosis present

## 2018-04-08 DIAGNOSIS — R6520 Severe sepsis without septic shock: Secondary | ICD-10-CM

## 2018-04-08 DIAGNOSIS — A419 Sepsis, unspecified organism: Secondary | ICD-10-CM | POA: Diagnosis present

## 2018-04-08 DIAGNOSIS — R000 Tachycardia, unspecified: Secondary | ICD-10-CM | POA: Diagnosis not present

## 2018-04-08 DIAGNOSIS — I471 Supraventricular tachycardia: Secondary | ICD-10-CM | POA: Diagnosis present

## 2018-04-08 DIAGNOSIS — R509 Fever, unspecified: Secondary | ICD-10-CM | POA: Diagnosis not present

## 2018-04-08 DIAGNOSIS — N390 Urinary tract infection, site not specified: Secondary | ICD-10-CM | POA: Diagnosis not present

## 2018-04-08 DIAGNOSIS — N3000 Acute cystitis without hematuria: Secondary | ICD-10-CM | POA: Diagnosis present

## 2018-04-08 DIAGNOSIS — C787 Secondary malignant neoplasm of liver and intrahepatic bile duct: Secondary | ICD-10-CM | POA: Diagnosis present

## 2018-04-08 DIAGNOSIS — E118 Type 2 diabetes mellitus with unspecified complications: Secondary | ICD-10-CM

## 2018-04-08 DIAGNOSIS — C7802 Secondary malignant neoplasm of left lung: Secondary | ICD-10-CM | POA: Diagnosis present

## 2018-04-08 DIAGNOSIS — J181 Lobar pneumonia, unspecified organism: Secondary | ICD-10-CM | POA: Diagnosis present

## 2018-04-08 DIAGNOSIS — D649 Anemia, unspecified: Secondary | ICD-10-CM | POA: Diagnosis not present

## 2018-04-08 DIAGNOSIS — R197 Diarrhea, unspecified: Secondary | ICD-10-CM | POA: Diagnosis not present

## 2018-04-08 DIAGNOSIS — E878 Other disorders of electrolyte and fluid balance, not elsewhere classified: Secondary | ICD-10-CM

## 2018-04-08 DIAGNOSIS — Z9221 Personal history of antineoplastic chemotherapy: Secondary | ICD-10-CM

## 2018-04-08 DIAGNOSIS — Z79899 Other long term (current) drug therapy: Secondary | ICD-10-CM | POA: Diagnosis not present

## 2018-04-08 DIAGNOSIS — T451X5A Adverse effect of antineoplastic and immunosuppressive drugs, initial encounter: Secondary | ICD-10-CM | POA: Diagnosis present

## 2018-04-08 DIAGNOSIS — I5032 Chronic diastolic (congestive) heart failure: Secondary | ICD-10-CM | POA: Diagnosis present

## 2018-04-08 DIAGNOSIS — R6521 Severe sepsis with septic shock: Secondary | ICD-10-CM | POA: Diagnosis present

## 2018-04-08 DIAGNOSIS — D61810 Antineoplastic chemotherapy induced pancytopenia: Secondary | ICD-10-CM | POA: Diagnosis present

## 2018-04-08 DIAGNOSIS — J9601 Acute respiratory failure with hypoxia: Secondary | ICD-10-CM | POA: Diagnosis present

## 2018-04-08 DIAGNOSIS — E440 Moderate protein-calorie malnutrition: Secondary | ICD-10-CM | POA: Diagnosis present

## 2018-04-08 DIAGNOSIS — Z95828 Presence of other vascular implants and grafts: Secondary | ICD-10-CM

## 2018-04-08 DIAGNOSIS — Z794 Long term (current) use of insulin: Secondary | ICD-10-CM | POA: Diagnosis not present

## 2018-04-08 LAB — GASTROINTESTINAL PANEL BY PCR, STOOL (REPLACES STOOL CULTURE)

## 2018-04-08 LAB — COMPREHENSIVE METABOLIC PANEL
ALBUMIN: 2.5 g/dL — AB (ref 3.5–5.0)
ALT: 16 U/L (ref 0–44)
ALT: 16 U/L (ref 0–44)
AST: 24 U/L (ref 15–41)
AST: 22 U/L (ref 15–41)
Albumin: 2.6 g/dL — ABNORMAL LOW (ref 3.5–5.0)
Alkaline Phosphatase: 59 U/L (ref 38–126)
Alkaline Phosphatase: 54 U/L (ref 38–126)
Anion gap: 7 (ref 5–15)
Anion gap: 8 (ref 5–15)
BUN: 13 mg/dL (ref 6–20)
BUN: 12 mg/dL (ref 6–20)
CO2: 19 mmol/L — ABNORMAL LOW (ref 22–32)
CO2: 19 mmol/L — ABNORMAL LOW (ref 22–32)
Calcium: 7.1 mg/dL — ABNORMAL LOW (ref 8.9–10.3)
Calcium: 7.4 mg/dL — ABNORMAL LOW (ref 8.9–10.3)
Chloride: 110 mmol/L (ref 98–111)
Chloride: 112 mmol/L — ABNORMAL HIGH (ref 98–111)
Creatinine, Ser: 0.85 mg/dL (ref 0.44–1.00)
Creatinine, Ser: 0.76 mg/dL (ref 0.44–1.00)
GFR calc Af Amer: >60 mL/min (ref >60)
GFR calc Af Amer: >60 mL/min (ref >60)
GFR calc non Af Amer: >60 mL/min (ref >60)
GFR calc non Af Amer: >60 mL/min (ref >60)
Glucose, Bld: 212 mg/dL — ABNORMAL HIGH (ref 70–99)
Glucose, Bld: 161 mg/dL — ABNORMAL HIGH (ref 70–99)
POTASSIUM: 3.2 mmol/L — AB (ref 3.5–5.1)
Potassium: 3.5 mmol/L (ref 3.5–5.1)
Sodium: 136 mmol/L (ref 135–145)
Sodium: 139 mmol/L (ref 135–145)
Total Bilirubin: 3.3 mg/dL — ABNORMAL HIGH (ref 0.3–1.2)
Total Bilirubin: 4.2 mg/dL — ABNORMAL HIGH (ref 0.3–1.2)
Total Protein: 5.7 g/dL — ABNORMAL LOW (ref 6.5–8.1)
Total Protein: 6 g/dL — ABNORMAL LOW (ref 6.5–8.1)

## 2018-04-08 LAB — CBC WITH DIFFERENTIAL/PLATELET
Abs Immature Granulocytes: 0.4 10*3/uL — ABNORMAL HIGH (ref 0.00–0.07)
Abs Immature Granulocytes: 1 10*3/uL — ABNORMAL HIGH (ref 0.00–0.07)
BASOS ABS: 0 10*3/uL (ref 0.0–0.1)
BASOS PCT: 0 %
Band Neutrophils: 5 %
Basophils Absolute: 0 10*3/uL (ref 0.0–0.1)
Basophils Relative: 1 %
Blasts: 2 %
EOS ABS: 0 10*3/uL (ref 0.0–0.5)
Eosinophils Absolute: 0 10*3/uL (ref 0.0–0.5)
Eosinophils Relative: 0 %
Eosinophils Relative: 0 %
HCT: 18.4 % — ABNORMAL LOW (ref 36.0–46.0)
HCT: 26.3 % — ABNORMAL LOW (ref 36.0–46.0)
Hemoglobin: 5.7 g/dL — CL (ref 12.0–15.0)
Hemoglobin: 8.1 g/dL — ABNORMAL LOW (ref 12.0–15.0)
Immature Granulocytes: 27 %
Lymphocytes Relative: 18 %
Lymphocytes Relative: 13 %
Lymphs Abs: 1.4 10*3/uL (ref 0.7–4.0)
Lymphs Abs: 0.5 10*3/uL — ABNORMAL LOW (ref 0.7–4.0)
MCH: 26.6 pg (ref 26.0–34.0)
MCH: 26.8 pg (ref 26.0–34.0)
MCHC: 31 g/dL (ref 30.0–36.0)
MCHC: 30.8 g/dL (ref 30.0–36.0)
MCV: 86 fL (ref 80.0–100.0)
MCV: 87.1 fL (ref 80.0–100.0)
MONO ABS: 0.7 10*3/uL (ref 0.1–1.0)
Metamyelocytes Relative: 1 %
Monocytes Absolute: 2 10*3/uL — ABNORMAL HIGH (ref 0.1–1.0)
Monocytes Relative: 27 %
Monocytes Relative: 19 %
Myelocytes: 4 %
NEUTROS PCT: 43 %
NRBC: 6.4 % — AB (ref 0.0–0.2)
Neutro Abs: 3.6 10*3/uL (ref 1.7–7.7)
Neutro Abs: 1.5 10*3/uL — ABNORMAL LOW (ref 1.7–7.7)
Neutrophils Relative %: 40 %
Platelets: 109 10*3/uL — ABNORMAL LOW (ref 150–400)
Platelets: 74 10*3/uL — ABNORMAL LOW (ref 150–400)
RBC: 2.14 MIL/uL — ABNORMAL LOW (ref 3.87–5.11)
RBC: 3.02 MIL/uL — ABNORMAL LOW (ref 3.87–5.11)
RDW: 24.5 % — ABNORMAL HIGH (ref 11.5–15.5)
RDW: 21.7 % — AB (ref 11.5–15.5)
WBC: 7.5 10*3/uL (ref 4.0–10.5)
WBC: 3.7 10*3/uL — ABNORMAL LOW (ref 4.0–10.5)
nRBC: 4.3 % — ABNORMAL HIGH (ref 0.0–0.2)

## 2018-04-08 LAB — TRANSFUSION REACTION
DAT C3: NEGATIVE
Post RXN DAT IgG: NEGATIVE

## 2018-04-08 LAB — RESPIRATORY PANEL BY PCR

## 2018-04-08 LAB — URINALYSIS, COMPLETE (UACMP) WITH MICROSCOPIC
Bilirubin Urine: NEGATIVE
Glucose, UA: 50 mg/dL — AB
Ketones, ur: 5 mg/dL — AB
Leukocytes,Ua: NEGATIVE
Nitrite: NEGATIVE
Protein, ur: 30 mg/dL — AB
Specific Gravity, Urine: 1.019 (ref 1.005–1.030)
pH: 5 (ref 5.0–8.0)

## 2018-04-08 LAB — BLOOD GAS, ARTERIAL
Acid-base deficit: 4.4 mmol/L — ABNORMAL HIGH (ref 0.0–2.0)
Acid-base deficit: 6.1 mmol/L — ABNORMAL HIGH (ref 0.0–2.0)
Bicarbonate: 19 mmol/L — ABNORMAL LOW (ref 20.0–28.0)
Bicarbonate: 18.2 mmol/L — ABNORMAL LOW (ref 20.0–28.0)
DELIVERY SYSTEMS: POSITIVE
Drawn by: 235321
Drawn by: 257701
Expiratory PAP: 6
Expiratory PAP: 6
FIO2: 60
FIO2: 40
Inspiratory PAP: 14
Inspiratory PAP: 8
Mode: POSITIVE
O2 Saturation: 97.5 %
O2 Saturation: 98 %
PATIENT TEMPERATURE: 102.8
PCO2 ART: 37.5 mmHg (ref 32.0–48.0)
Patient temperature: 98.6
RATE: 12 resp/min
pCO2 arterial: 31.1 mmHg — ABNORMAL LOW (ref 32.0–48.0)
pH, Arterial: 7.402 (ref 7.350–7.450)
pH, Arterial: 7.321 — ABNORMAL LOW (ref 7.350–7.450)
pO2, Arterial: 95.1 mmHg (ref 83.0–108.0)
pO2, Arterial: 121 mmHg — ABNORMAL HIGH (ref 83.0–108.0)

## 2018-04-08 LAB — HEMOGLOBIN A1C
Hgb A1c MFr Bld: 7.5 % — ABNORMAL HIGH (ref 4.8–5.6)
Mean Plasma Glucose: 168.55 mg/dL

## 2018-04-08 LAB — CBC
HCT: 24 % — ABNORMAL LOW (ref 36.0–46.0)
Hemoglobin: 7.6 g/dL — ABNORMAL LOW (ref 12.0–15.0)
MCH: 27.1 pg (ref 26.0–34.0)
MCHC: 31.7 g/dL (ref 30.0–36.0)
MCV: 85.7 fL (ref 80.0–100.0)
Platelets: 107 10*3/uL — ABNORMAL LOW (ref 150–400)
RBC: 2.8 MIL/uL — ABNORMAL LOW (ref 3.87–5.11)
RDW: 21.5 % — ABNORMAL HIGH (ref 11.5–15.5)
WBC: 9.1 10*3/uL (ref 4.0–10.5)
nRBC: 3.8 % — ABNORMAL HIGH (ref 0.0–0.2)

## 2018-04-08 LAB — GLUCOSE, CAPILLARY
GLUCOSE-CAPILLARY: 144 mg/dL — AB (ref 70–99)
Glucose-Capillary: 120 mg/dL — ABNORMAL HIGH (ref 70–99)
Glucose-Capillary: 134 mg/dL — ABNORMAL HIGH (ref 70–99)
Glucose-Capillary: 137 mg/dL — ABNORMAL HIGH (ref 70–99)
Glucose-Capillary: 179 mg/dL — ABNORMAL HIGH (ref 70–99)
Glucose-Capillary: 186 mg/dL — ABNORMAL HIGH (ref 70–99)

## 2018-04-08 LAB — C DIFFICILE QUICK SCREEN W PCR REFLEX
C Diff antigen: NEGATIVE
C Diff interpretation: NOT DETECTED
C Diff toxin: NEGATIVE

## 2018-04-08 LAB — RETICULOCYTES
IMMATURE RETIC FRACT: 9.6 % (ref 2.3–15.9)
RBC.: 3.02 MIL/uL — ABNORMAL LOW (ref 3.87–5.11)
Retic Count, Absolute: 48.6 10*3/uL (ref 19.0–186.0)
Retic Ct Pct: 1.6 % (ref 0.4–3.1)

## 2018-04-08 LAB — MAGNESIUM: Magnesium: 1.8 mg/dL (ref 1.7–2.4)

## 2018-04-08 LAB — LACTATE DEHYDROGENASE: LDH: 220 U/L — ABNORMAL HIGH (ref 98–192)

## 2018-04-08 LAB — TSH: TSH: 0.962 u[IU]/mL (ref 0.350–4.500)

## 2018-04-08 LAB — PHOSPHORUS: Phosphorus: 2.2 mg/dL — ABNORMAL LOW (ref 2.5–4.6)

## 2018-04-08 MED ORDER — ACETAMINOPHEN 10 MG/ML IV SOLN
1000.0000 mg | Freq: Two times a day (BID) | INTRAVENOUS | Status: AC | PRN
  Administered 2018-04-08: 1000 mg via INTRAVENOUS
  Filled 2018-04-08: qty 100

## 2018-04-08 MED ORDER — DILTIAZEM HCL 100 MG IV SOLR
5.0000 mg/h | INTRAVENOUS | Status: DC
  Administered 2018-04-08: 5 mg/h via INTRAVENOUS
  Administered 2018-04-09: 7.5 mg/h via INTRAVENOUS
  Filled 2018-04-08 (×2): qty 100

## 2018-04-08 MED ORDER — ACETAMINOPHEN 500 MG PO TABS
1000.0000 mg | ORAL_TABLET | Freq: Four times a day (QID) | ORAL | Status: DC | PRN
  Administered 2018-04-10: 1000 mg via ORAL
  Filled 2018-04-08 (×2): qty 2

## 2018-04-08 MED ORDER — POTASSIUM PHOSPHATES 15 MMOLE/5ML IV SOLN
20.0000 meq | Freq: Once | INTRAVENOUS | Status: AC
  Administered 2018-04-08: 20 meq via INTRAVENOUS
  Filled 2018-04-08: qty 4.55

## 2018-04-08 MED ORDER — DILTIAZEM LOAD VIA INFUSION
10.0000 mg | Freq: Once | INTRAVENOUS | Status: DC
  Filled 2018-04-08: qty 10

## 2018-04-08 MED ORDER — SODIUM BICARBONATE 8.4 % IV SOLN
100.0000 meq | Freq: Once | INTRAVENOUS | Status: AC
  Administered 2018-04-08: 100 meq via INTRAVENOUS
  Filled 2018-04-08: qty 50

## 2018-04-08 MED ORDER — POTASSIUM PHOSPHATES 15 MMOLE/5ML IV SOLN
40.0000 meq | Freq: Once | INTRAVENOUS | Status: AC
  Administered 2018-04-08: 40 meq via INTRAVENOUS
  Filled 2018-04-08: qty 9.09

## 2018-04-08 MED ORDER — CHLORHEXIDINE GLUCONATE CLOTH 2 % EX PADS
6.0000 | MEDICATED_PAD | Freq: Every day | CUTANEOUS | Status: DC
  Administered 2018-04-08 – 2018-04-09 (×2): 6 via TOPICAL

## 2018-04-08 MED ORDER — IOHEXOL 300 MG/ML  SOLN
100.0000 mL | Freq: Once | INTRAMUSCULAR | Status: AC | PRN
  Administered 2018-04-08: 100 mL via INTRAVENOUS

## 2018-04-08 MED ORDER — LORAZEPAM 2 MG/ML IJ SOLN
INTRAMUSCULAR | Status: AC
  Filled 2018-04-08: qty 1

## 2018-04-08 MED ORDER — POTASSIUM CHLORIDE 10 MEQ/100ML IV SOLN: 10.0000 meq | INTRAVENOUS | Status: DC

## 2018-04-08 MED ORDER — OXYCODONE HCL 5 MG PO TABS: 5.0000 mg | ORAL_TABLET | ORAL | Status: DC | PRN

## 2018-04-08 MED ORDER — SODIUM CHLORIDE 0.9% FLUSH
10.0000 mL | INTRAVENOUS | Status: DC | PRN
  Administered 2018-04-14: 10 mL
  Filled 2018-04-08: qty 40

## 2018-04-08 MED ORDER — ACETAMINOPHEN 325 MG PO TABS
325.0000 mg | ORAL_TABLET | Freq: Once | ORAL | Status: AC
  Administered 2018-04-08: 325 mg via ORAL
  Filled 2018-04-08: qty 1

## 2018-04-08 MED ORDER — LORAZEPAM 2 MG/ML IJ SOLN: 0.5000 mg | Freq: Once | INTRAMUSCULAR | Status: DC | PRN

## 2018-04-08 MED ORDER — LIP MEDEX EX OINT
TOPICAL_OINTMENT | CUTANEOUS | Status: DC | PRN
  Administered 2018-04-15: 09:00:00 via TOPICAL
  Filled 2018-04-08 (×2): qty 7

## 2018-04-08 MED ORDER — ACETAMINOPHEN 10 MG/ML IV SOLN
1000.0000 mg | Freq: Once | INTRAVENOUS | Status: DC
  Filled 2018-04-08: qty 100

## 2018-04-08 MED ORDER — CHLORHEXIDINE GLUCONATE 0.12 % MT SOLN
15.0000 mL | Freq: Two times a day (BID) | OROMUCOSAL | Status: DC
  Administered 2018-04-08 – 2018-04-15 (×13): 15 mL via OROMUCOSAL
  Filled 2018-04-08 (×13): qty 15

## 2018-04-08 MED ORDER — ORAL CARE MOUTH RINSE
15.0000 mL | Freq: Two times a day (BID) | OROMUCOSAL | Status: DC
  Administered 2018-04-09 – 2018-04-15 (×8): 15 mL via OROMUCOSAL

## 2018-04-08 MED ORDER — ACETAMINOPHEN 650 MG RE SUPP: 650.0000 mg | Freq: Four times a day (QID) | RECTAL | Status: DC | PRN

## 2018-04-08 MED ORDER — ADULT MULTIVITAMIN W/MINERALS CH
1.0000 | ORAL_TABLET | Freq: Every day | ORAL | Status: DC
  Administered 2018-04-08: 1 via ORAL
  Filled 2018-04-08 (×2): qty 1

## 2018-04-08 NOTE — Progress Notes (Addendum)
PROGRESS NOTE  Stacy Shelton QHU:[id number] DOB: Dec 06, 1960 DOA: 04/07/2018 PCP: Robyne Peers, MD  HPI/Recap of past 24 hours: Stacy Shelton is a 58 y.o. female with medical history significant of metastatic breast cancer to brain, liver, and bone, anemia, DM2, who presented to [facility] ED from her oncologist office due to fever up to 101.4, persistent diarrhea, hypotension, electrolyte imbalance, and severe anemia.  She had a root canal 3 wks ago and was given Amoxicillin.  TRH asked to admit.    In the ED blood pressures did not respond to aggressive IV fluid hydration and was started on pressors.  Blood cultures were drawn and she was started on broad-spectrum IV antibiotics.  Transfered to stepdown unit for pressor support.  04/08/18: Patient seen and examined at her bedside this morning she is on BiPAP and states she is breathing better on it.  Was placed on BiPAP overnight due to increased work of breathing.  She is alert and oriented x3.  She has recurrent fevers this morning with T-max 102.9.  Urine culture, blood cultures peripherally x2 in process.  UA positive for pyuria.  Currently on broad-spectrum IV antibiotics IV vancomycin, IV Flagyl, and IV cefepime.  Consulted infectious disease to further evaluate.    Assessment/Plan: Active Problems:   Absolute anemia   Metastatic breast cancer (HCC)   Diarrhea   Sepsis (HCC)   Hypokalemia   Hypomagnesemia   Acute lower UTI   Hypophosphatemia   Septic shock (HCC)  Severe sepsis with resolved septic shock with no clear source possibly secondary to cystitis versus others Presented with fever 101.4, dyspnea, tachypnea, elevated lactic acid 1.9, procalcitonin 14.25 On presentation did not respond to IV fluid boluses Was started on vasopressors IV phenylephrine in the ED to maintain map greater than 65 and transferred to stepdown unit Urine culture and blood cultures x2 peripherally obtained and in process Was started on broad-spectrum IV  antibiotics in the ED IV vancomycin, IV Flagyl, and IV cefepime, continue Now off vasopressors and hypertensive with SVT She is still having fevers with T-max 102.9 this morning Infectious disease consulted to further assess Maintain map greater than 65 Obtain stat CT head, chest, and abd/pelvis  Hyperbilirubinemia with jaundice, rule out hemolytic anemia Tbili 3.3 from 1.8 Suspect hemolytic anemia Will obtain stat LDH, haptoglobin and reticulocytes count Stat CT abd and pelvis Update Dr Alvy Bimler  Uncontrolled SVT Hx of Sinus tach on Toprol XL 50 mg daily Started on diltiazem bolus 10 mg and drip, continue  OSA on CPAP  Avoid benzodiazepines Has never been intubated Continue BIPAP as needed Resume CPAP when indicated  Persistent Tachypnea  RN reports O2 sat drops in the 80's when off BIPAP 1st ABG done this morning wit normal PH and low PCO2 on BIPAP with 60% FiO2 14/6 Repeat ABG this afternoon on BIPAP 50% FIO2  Cystitis Presented with a positive UA in the setting of immunosuppression Urine culture in process Broad-spectrum IV antibiotics  Monitor urine output and symptomatology  Acute diarrhea, unclear etiology GI panel in process Persistent diarrhea this morning Recently on oral antibiotics 3 weeks ago If persistent and watery may consider C. difficile PCR  Chronic diastolic CHF Last 2D echo done on 03/21/2018 revealed LVEF 60 to 65% Strict I's and O's and daily weight Hold off medications that can affect her blood pressure due to recent septic shock  Anemia of chronic disease suspect secondary to bone marrow suppression from recent chemotherapy Hemoglobin dropped to 5.7 Did  not complete planned PRBC units transfusions due to fever Latest hemoglobin 7.6 Goal is to maintain hemoglobin greater than 8.0  Thrombocytopenia Platelet count 107K from 109K No sign of overt bleeding  Resolving hypokalemia suspect secondary to GI losses Presented with potassium  2.7 Resolving post repletion Potassium today 3.5 Repeat BMP in the morning  Resolving hypomagnesemia suspect secondary to GI losses Presented with magnesium of 1.3 Magnesium today 1.8  Type 2 diabetes Hemoglobin A1c 7.5 on 04/08/2018 Continue insulin sliding scale  History of hypertension Hold Toprol XL 50 mg nightly due to recent septic shock Blood pressure is currently soft  Metastatic breast cancer with mets to brain liver and bone She is alert and oriented x3 Independently reviewed latest MRI brain done on 03/16/2018 which showed right occipital lobe lesion with central necrosis and probable mild hemorrhage or calcification She had recent chemotherapy Followed by Dr. Alvy Bimler   Risks: This patient is high risk for decompensation due to septic shock requiring pressors to maintain map greater than 65, recurrent high fevers with no clear source, immunosuppression, bone marrow suppression from recent chemotherapy, severe anemia requiring blood transfusions, electrolytes imbalance, multiple comorbidities and age.  Patient will require at least 2 midnights for further evaluation and treatment of present condition.    Patient at this point requires close monitoring in stepdown unit.   Code Status: Full code  Family Communication: None at bedside  Disposition Plan: Home in 2 to 3 days when clinically stable and infectious disease has signed off.   Consultants:  Infectious disease  Heme oncology  PCCM  Procedures:  None  Antimicrobials:  IV vancomycin, IV Flagyl, IV cefepime  DVT prophylaxis: SCDs   Objective: Vitals:   04/08/18 0500 04/08/18 0526 04/08/18 0805 04/08/18 0910  BP: (!) 164/56     Pulse: (!) 133 (!) 131    Resp: (!) 48 (!) 43    Temp:   (!) 103.1 F (39.5 C) (!) 102.9 F (39.4 C)  TempSrc:   Axillary Axillary  SpO2: 92% 100%    Weight:      Height:        Intake/Output Summary (Last 24 hours) at 04/08/2018 0940 Last data filed at 04/08/2018  9702 Gross per 24 hour  Intake 4066.97 ml  Output 500 ml  Net 3566.97 ml   Filed Weights   04/07/18 1614  Weight: 67.1 kg    Exam:  . General: 58 y.o. year-old female well developed well nourished.  Alert and oriented x3.  She is on BiPAP in no acute distress. . Cardiovascular: Regular rate and rhythm with no rubs or gallops.  No thyromegaly or JVD noted.   Marland Kitchen Respiratory: Clear to auscultation with no wheezes or rales. Good inspiratory effort. . Abdomen: Soft nontender nondistended with normal bowel sounds x4 quadrants. . Musculoskeletal: Trace lower extremity edema. 2/4 pulses in all 4 extremities. Marland Kitchen Psychiatry: Mood is appropriate for condition and setting   Data Reviewed: CBC: Recent Labs  Lab 04/07/18 1141 04/07/18 2042 04/08/18 0216  WBC 11.1* 7.5 9.1  NEUTROABS 4.2 3.6  --   HGB 7.2* 5.7* 7.6*  HCT 22.9* 18.4* 24.0*  MCV 83.3 86.0 85.7  PLT 151 109* 637*   Basic Metabolic Panel: Recent Labs  Lab 04/07/18 1143 04/07/18 2042 04/08/18 0216  NA 132* 135  133* 136  K 2.7* 3.0*  2.9* 3.5  CL 95* 106  106 110  CO2 22 17*  18* 19*  GLUCOSE 223* 262*  258* 212*  BUN  10 12  11 13   CREATININE 0.78 0.83  0.82 0.85  CALCIUM 8.7* 7.5*  7.3* 7.1*  MG 1.3* 1.9 1.8  PHOS  --  2.0* 2.2*   GFR: Estimated Creatinine Clearance: 64.8 mL/min (by C-G formula based on SCr of 0.85 mg/dL). Liver Function Tests: Recent Labs  Lab 04/07/18 1143 04/07/18 2042 04/08/18 0216  AST 15 11* 24  ALT 13 12 16   ALKPHOS 92 64 59  BILITOT 1.5* 1.8* 3.3*  PROT 7.4 5.7* 5.7*  ALBUMIN 3.7 2.6* 2.6*   No results for input(s): LIPASE, AMYLASE in the last 168 hours. No results for input(s): AMMONIA in the last 168 hours. Coagulation Profile: Recent Labs  Lab 04/07/18 2042  INR 1.6*   Cardiac Enzymes: No results for input(s): CKTOTAL, CKMB, CKMBINDEX, TROPONINI in the last 168 hours. BNP (last 3 results) No results for input(s): PROBNP in the last 8760  hours. HbA1C: Recent Labs    04/08/18 0216  HGBA1C 7.5*   CBG: Recent Labs  Lab 04/07/18 2117 04/07/18 2304 04/08/18 0317 04/08/18 0801  GLUCAP 259* 215* 179* 186*   Lipid Profile: No results for input(s): CHOL, HDL, LDLCALC, TRIG, CHOLHDL, LDLDIRECT in the last 72 hours. Thyroid Function Tests: Recent Labs    04/08/18 0216  TSH 0.962   Anemia Panel: No results for input(s): VITAMINB12, FOLATE, FERRITIN, TIBC, IRON, RETICCTPCT in the last 72 hours. Urine analysis:    Component Value Date/Time   COLORURINE YELLOW 04/07/2018 1115   APPEARANCEUR HAZY (A) 04/07/2018 1115   LABSPEC 1.015 04/07/2018 1115   PHURINE 6.0 04/07/2018 1115   GLUCOSEU NEGATIVE 04/07/2018 1115   HGBUR SMALL (A) 04/07/2018 1115   BILIRUBINUR NEGATIVE 04/07/2018 1115   KETONESUR 20 (A) 04/07/2018 1115   PROTEINUR 100 (A) 04/07/2018 1115   NITRITE POSITIVE (A) 04/07/2018 1115   LEUKOCYTESUR SMALL (A) 04/07/2018 1115   Sepsis Labs: @LABRCNTIP (procalcitonin:4,lacticidven:4)  ) Recent Results (from the past 240 hour(s))  Culture, Blood     Status: None (Preliminary result)   Collection Time: 04/07/18 11:27 AM  Result Value Ref Range Status   Specimen Description   Final    BLOOD RIGHT ARM Performed at [facility], [place] [address], [place], [place] [address]    Special Requests   Final    BOTTLES DRAWN AEROBIC AND ANAEROBIC Blood Culture results may not be optimal due to an excessive volume of blood received in culture bottles   Culture   Final    NO GROWTH < 24 HOURS Performed at [place] [address], [place], [place] [address]    Report Status PENDING  Incomplete  Culture, Blood     Status: None (Preliminary result)   Collection Time: 04/07/18 12:15 PM  Result Value Ref Range Status   Specimen Description   Final    PORTA CATH Performed at [facility], [place] [address] [place], [place] [address]    Special Requests   Final     BOTTLES DRAWN AEROBIC AND ANAEROBIC Blood Culture adequate volume   Culture   Final    NO GROWTH < 24 HOURS Performed at [facility], [place] [address], [place], [place] [address]    Report Status PENDING  Incomplete  MRSA PCR Screening     Status: None   Collection Time: 04/07/18  8:41 PM  Result Value Ref Range Status   MRSA by PCR NEGATIVE NEGATIVE Final    Comment:  The GeneXpert MRSA Assay (FDA approved for NASAL specimens only), is one component of a comprehensive MRSA colonization surveillance program. It is not intended to diagnose MRSA infection nor to guide or monitor treatment for MRSA infections. Performed at [facility], [place] [address], [place], [place] [address]   Respiratory Panel by PCR     Status: None   Collection Time: 04/08/18  1:06 AM  Result Value Ref Range Status   Adenovirus NOT DETECTED NOT DETECTED Final   Coronavirus 229E NOT DETECTED NOT DETECTED Final    Comment: (NOTE) The Coronavirus on the Respiratory Panel, DOES NOT test for the novel  Coronavirus (2019 nCoV)    Coronavirus HKU1 NOT DETECTED NOT DETECTED Final   Coronavirus NL63 NOT DETECTED NOT DETECTED Final   Coronavirus OC43 NOT DETECTED NOT DETECTED Final   Metapneumovirus NOT DETECTED NOT DETECTED Final   Rhinovirus / Enterovirus NOT DETECTED NOT DETECTED Final   Influenza A NOT DETECTED NOT DETECTED Final   Influenza B NOT DETECTED NOT DETECTED Final   Parainfluenza Virus 1 NOT DETECTED NOT DETECTED Final   Parainfluenza Virus 2 NOT DETECTED NOT DETECTED Final   Parainfluenza Virus 3 NOT DETECTED NOT DETECTED Final   Parainfluenza Virus 4 NOT DETECTED NOT DETECTED Final   Respiratory Syncytial Virus NOT DETECTED NOT DETECTED Final   Bordetella pertussis NOT DETECTED NOT DETECTED Final   Chlamydophila pneumoniae NOT DETECTED NOT DETECTED Final   Mycoplasma pneumoniae NOT DETECTED NOT DETECTED Final    Comment: Performed at [facility],  [address] [place], [place] [address]      Studies: Dg Chest Port 1 View  Result Date: 04/07/2018 CLINICAL DATA:  Breast cancer with metastases.  Fever. EXAM: PORTABLE CHEST 1 VIEW COMPARISON:  CT scan March 11, 2018 FINDINGS: There is an oval hazy opacity in the right upper lobe which correlates with the site of a previously identified nodule, likely a metastatic lesion. No other definitive pulmonary nodules. The heart, hila, and mediastinum are unremarkable. No pneumothorax. The right Port-A-Cath terminates in the SVC. No other acute abnormalities. IMPRESSION: 1. The ill-defined hazy rounded opacity in the right upper lobe correlates with a previously identified nodule thought to be a metastatic lesion. No other abnormalities identified on today's study. Electronically Signed   By: Dorise Bullion III M.D   On: 04/07/2018 16:54    Scheduled Meds: . sodium chloride  250 mL Intravenous Once  . sodium chloride   Intravenous Once  . Chlorhexidine Gluconate Cloth  6 each Topical Daily  . feeding supplement  1 Container Oral TID BM  . insulin aspart  0-9 Units Subcutaneous Q4H  . insulin detemir  15 Units Subcutaneous Daily  . montelukast  10 mg Oral Daily  . phosphorus  250 mg Oral BID    Continuous Infusions: . sodium chloride    . ceFEPime (MAXIPIME) IV 2 g (04/08/18 0827)  . metronidazole Stopped (04/08/18 0311)  . phenylephrine (NEO-SYNEPHRINE) Adult infusion Stopped (04/08/18 0453)  . vancomycin       LOS: 0 days     Kayleen Memos, MD [facility] Pager [phone]  If 7PM-7AM, please contact night-coverage [email or web address] Password [email or web address] 04/08/2018, 9:40 AM

## 2018-04-08 NOTE — Progress Notes (Signed)
Axillary temp 101.4, notified Oncologist Gorsuch.   Additional 325 mg Tylenol and cooling blanket ordered.   No blood transfusion at this time due to continued fever,  MD reports no repeat CBC until temp has lowered.

## 2018-04-08 NOTE — Progress Notes (Signed)
MD notified for fever of 102.8. Pt supposed to receive two units packed red blood cells. Instructed to administer Tylenol, wait for patient to cool down, and administer blood products. Temp 99.8 at 2145. Blood transfusion began

## 2018-04-08 NOTE — Progress Notes (Signed)
eLink Physician-Brief Progress Note Patient Name: VELICIA DEJAGER DOB: 25-Mar-1960 MRN: [id number]   Date of Service  04/08/2018  HPI/Events of Note  Increased WOB, increased RR and increased O2 requirement.   eICU Interventions  Will order: 1. BiPAP. 2. ABG at 6:30 PM.     Intervention Category Major Interventions: Respiratory failure - evaluation and management  Abrianna Sidman [place] 04/08/2018, 5:14 AM

## 2018-04-08 NOTE — Progress Notes (Signed)
Turned pt to remove cooling blanket prior to transport to CT, upon laying pt down, pt reports "unable to breath and she is not getting any air".   Elevated HOB, verbally calmed pt.   Notified MD [place] Crane, 1 time PRN Ativan dose ordered for CT scan.

## 2018-04-08 NOTE — Progress Notes (Signed)
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 Continue aggressive supportive care  Recurrent fever, respiratory distress Cultures are pending.  Preliminary diagnosis is likely related to sepsis. She is currently on broad-spectrum IV antibiotics.  Continue for now  Progressive pancytopenia Likely due to bone marrow suppression from recent chemotherapy, on the background history of bone metastasis I recommend transfusion support to keep hemoglobin greater than 8.  She does not need platelet transfusion unless platelet count falls to less than 10,000 or signs of bleeding  Poorly controlled diabetes She will continue insulin treatment  Multiple electrolyte imbalance Replace as needed  CODE STATUS Full code  Discharge planning Anticipate she will be in the hospital for the next few days I will continue to follow  [name], MD 04/08/2018  8:22 AM   Subjective:  The patient was admitted through symptomatic treatment from the cancer center after the presented with fever and severe anemia. Due to cardiovascular instability, she was admitted to the ICU.  Overnight, due to respiratory distress, BiPAP was initiated.  The patient received blood transfusion overnight.  She developed fever while receiving blood and that was discontinued.  Work-up for transfusion reaction so far is negative.  The patient continues to be febrile.  She denies nausea.  Unable to assess further as the patient is currently on BiPAP  Objective:  Vitals:   04/08/18 0500 04/08/18 0526  BP: (!) 164/56   Pulse: (!) 133 (!) 131  Resp: (!) 48 (!) 43  Temp:    SpO2: 92% 100%     Intake/Output Summary (Last 24 hours) at 04/08/2018 2956 Last data filed at 04/08/2018 0509 Gross per 24 hour  Intake 4066.97 ml  Output 500 ml  Net 3566.97 ml    GENERAL: The patient appears to be in  respiratory distress, on BiPAP.  Vital signs on the heart monitor revealed tachycardia but with adequate oxygen saturation SKIN: she looks pale EYES: normal, Conjunctiva are pale and non-injected, sclera clear OROPHARYNX:no exudate, no erythema and lips, buccal mucosa, and tongue normal  NECK: supple, thyroid normal size, non-tender, without nodularity LYMPH:  no palpable lymphadenopathy in the cervical, axillary or inguinal LUNGS: clear to auscultation and percussion with increased breathing effort HEART: tachycardia, no murmurs and no lower extremity edema ABDOMEN:abdomen soft, non-tender and normal bowel sounds Musculoskeletal:no cyanosis of digits and no clubbing  NEURO: appears alert and understand commands, unable to talk due to BiPAP, no focal motor/sensory deficits   Labs:  Lab Results  Component Value Date   WBC 9.1 04/08/2018   HGB 7.6 (L) 04/08/2018   HCT 24.0 (L) 04/08/2018   MCV 85.7 04/08/2018   PLT 107 (L) 04/08/2018   NEUTROABS 3.6 04/07/2018    Lab Results  Component Value Date   NA 136 04/08/2018   K 3.5 04/08/2018   CL 110 04/08/2018   CO2 19 (L) 04/08/2018    Studies:  Dg Chest Port 1 View  Result Date: 04/07/2018 CLINICAL DATA:  Breast cancer with metastases.  Fever. EXAM: PORTABLE CHEST 1 VIEW COMPARISON:  CT scan March 11, 2018 FINDINGS: There is an oval hazy opacity in the right upper lobe which correlates with the site of a previously identified nodule, likely a metastatic lesion. No other definitive pulmonary nodules. The heart, hila, and mediastinum are unremarkable. No pneumothorax. The right Port-A-Cath terminates in the SVC.  No other acute abnormalities. IMPRESSION: 1. The ill-defined hazy rounded opacity in the right upper lobe correlates with a previously identified nodule thought to be a metastatic lesion. No other abnormalities identified on today's study. Electronically Signed   By: Dorise Bullion III M.D   On: 04/07/2018 16:54

## 2018-04-08 NOTE — Progress Notes (Signed)
These preliminary result these preliminary results were noted.  Awaiting final report.

## 2018-04-08 NOTE — Consult Note (Addendum)
[facility] for Infectious Disease    Date of Admission:  04/07/2018   Total days of antibiotics: 1         vanco/cefepime/flagyl               Reason for Consult: Fever    Referring Provider: [place] Crane   Assessment: Fever, septic shock Metastatic breast cancer  Diarrhea Uncontrolled DM (A1C 12.9)  Plan: 1. Continue broad anbx 2. Await BCx 3. Check C diff (this is not part of GI panel).  4. Await UCx (her UA was not overwhelming for infection).  5. Diabetic control.   She may need repeat scans (head, chest, abd/pelvis) to eval for worsening metastatic disease  Thank you so much for this interesting consult,  Active Problems:   Absolute anemia   Metastatic breast cancer ([place])   Diarrhea   Sepsis ([place])   Hypokalemia   Hypomagnesemia   Acute lower UTI   Hypophosphatemia   Septic shock ([place])   . Chlorhexidine Gluconate Cloth  6 each Topical Daily  . feeding supplement  1 Container Oral TID BM  . insulin aspart  0-9 Units Subcutaneous Q4H  . insulin detemir  15 Units Subcutaneous Daily  . montelukast  10 mg Oral Daily  . phosphorus  250 mg Oral BID    HPI: Stacy Shelton is a 58 y.o. female with hx of metastatic breast CA (brain, bone, liver; last Chemo 03-26-18), adm 3-9 after developing explosive, sudden diarrhea and fever to 101.8. She was given levaquin in outpt clinic and sent to ED. She was hypoxic and hypotensive in ED, anemic hgb 5.7), and ultimately required pressor support. Her CXR showed no new lesions ( prev RUL lesion).  She was started on cefepime, flagyl, vanco.  She has been able to come off pressors today.   Lactate 1 Procalcitonin 14  Review of Systems: Review of Systems  Constitutional: Positive for fever.  Respiratory: Positive for shortness of breath.   Gastrointestinal: Positive for diarrhea. Negative for abdominal pain and constipation.  no problems with port, not accessed since chemo.  Please see HPI. All other systems reviewed and  negative.   Past Medical History:  Diagnosis Date  . Cancer [facility])     Social History   Tobacco Use  . Smoking status: Never Smoker  . Smokeless tobacco: Never Used  Substance Use Topics  . Alcohol use: Yes    Alcohol/week: 0.0 standard drinks  . Drug use: No    Family History  Problem Relation Age of Onset  . Allergic rhinitis Neg Hx   . Angioedema Neg Hx   . Asthma Neg Hx   . Atopy Neg Hx   . Eczema Neg Hx   . Immunodeficiency Neg Hx   . Urticaria Neg Hx      Medications:  Scheduled: . Chlorhexidine Gluconate Cloth  6 each Topical Daily  . feeding supplement  1 Container Oral TID BM  . insulin aspart  0-9 Units Subcutaneous Q4H  . insulin detemir  15 Units Subcutaneous Daily  . montelukast  10 mg Oral Daily  . phosphorus  250 mg Oral BID    Abtx:  Anti-infectives (From admission, onward)   Start     Dose/Rate Route Frequency Ordered Stop   04/08/18 2100  vancomycin (VANCOCIN) 1,250 mg in sodium chloride 0.9 % 250 mL IVPB     1,250 mg 166.7 mL/hr over 90 Minutes Intravenous Every 24 hours 04/07/18 2000  04/08/18 0200  metroNIDAZOLE (FLAGYL) IVPB 500 mg     500 mg 100 mL/hr over 60 Minutes Intravenous Every 8 hours 04/07/18 1933     04/08/18 0100  ceFEPIme (MAXIPIME) 2 g in sodium chloride 0.9 % 100 mL IVPB     2 g 200 mL/hr over 30 Minutes Intravenous Every 8 hours 04/07/18 1958     04/07/18 1715  vancomycin (VANCOCIN) 1,500 mg in sodium chloride 0.9 % 500 mL IVPB     1,500 mg 250 mL/hr over 120 Minutes Intravenous  Once 04/07/18 1636 04/07/18 2226   04/07/18 1630  ceFEPIme (MAXIPIME) 2 g in sodium chloride 0.9 % 100 mL IVPB     2 g 200 mL/hr over 30 Minutes Intravenous  Once 04/07/18 1626 04/07/18 1754   04/07/18 1630  metroNIDAZOLE (FLAGYL) IVPB 500 mg     500 mg 100 mL/hr over 60 Minutes Intravenous  Once 04/07/18 1626 04/07/18 1902   04/07/18 1630  vancomycin (VANCOCIN) IVPB 1000 mg/200 mL premix  Status:  Discontinued     1,000 mg 200 mL/hr over  60 Minutes Intravenous  Once 04/07/18 1626 04/07/18 1636        OBJECTIVE: Blood pressure (!) 152/52, pulse (!) 124, temperature (!) 104.3 F (40.2 C), temperature source Rectal, resp. rate (!) 33, height 5\' 2"  (1.575 m), weight 67.1 kg, last menstrual period 10/30/2010, SpO2 100 %.  Physical Exam Constitutional:      General: She is in acute distress.     Appearance: Normal appearance.  Eyes:     Extraocular Movements: Extraocular movements intact.     Pupils: Pupils are equal, round, and reactive to light.  Neck:     Musculoskeletal: Neck supple.  Cardiovascular:     Rate and Rhythm: Tachycardia present.  Pulmonary:     Effort: Respiratory distress present.     Breath sounds: Rhonchi present.  Chest:    Abdominal:     General: Bowel sounds are normal. There is distension.     Palpations: Abdomen is soft.     Tenderness: There is no abdominal tenderness.  Musculoskeletal:        General: No swelling or tenderness.  Neurological:     Mental Status: She is alert.     Lab Results Results for orders placed or performed during the hospital encounter of 04/07/18 (from the past 48 hour(s))  Prepare RBC     Status: None   Collection Time: 04/07/18  1:10 PM  Result Value Ref Range   Order Confirmation      ORDER PROCESSED BY BLOOD BANK Performed at [facility] [address], [place],  [address]   Type and screen [place]     Status: None (Preliminary result)   Collection Time: 04/07/18  1:10 PM  Result Value Ref Range   ABO/RH(D) A POS    Antibody Screen NEG    Sample Expiration 04/10/2018    Unit Number [id number]    Blood Component Type RED CELLS,LR    Unit division 00    Status of Unit ISSUED    Transfusion Status OK TO TRANSFUSE    Crossmatch Result Compatible    Unit Number [id number]    Blood Component Type RBC LR PHER1    Unit division 00    Status of Unit ISSUED,FINAL    Transfusion Status OK TO  TRANSFUSE    Crossmatch Result      Compatible Performed at [facility], [place]  [address] [place], [place] [address]   Lactic acid, plasma     Status: None   Collection Time: 04/07/18  4:26 PM  Result Value Ref Range   Lactic Acid, Venous 1.9 0.5 - 1.9 mmol/L    Comment: Performed at [facility], [place] [address] [place], [place] [address]  MRSA PCR Screening     Status: None   Collection Time: 04/07/18  8:41 PM  Result Value Ref Range   MRSA by PCR NEGATIVE NEGATIVE    Comment:        The GeneXpert MRSA Assay (FDA approved for NASAL specimens only), is one component of a comprehensive MRSA colonization surveillance program. It is not intended to diagnose MRSA infection nor to guide or monitor treatment for MRSA infections. Performed at [facility], [place] [address] [place], [place] [address]   Basic metabolic panel     Status: Abnormal   Collection Time: 04/07/18  8:42 PM  Result Value Ref Range   Sodium 135 135 - 145 mmol/L   Potassium 3.0 (L) 3.5 - 5.1 mmol/L   Chloride 106 98 - 111 mmol/L   CO2 17 (L) 22 - 32 mmol/L   Glucose, Bld 262 (H) 70 - 99 mg/dL   BUN 12 6 - 20 mg/dL   Creatinine, Ser 0.83 0.44 - 1.00 mg/dL   Calcium 7.5 (L) 8.9 - 10.3 mg/dL   GFR calc non Af Amer >60 >60 mL/min   GFR calc Af Amer >60 >60 mL/min   Anion gap 12 5 - 15    Comment: Performed at [facility], [place] [address], [place], [place] [address]  Magnesium     Status: None   Collection Time: 04/07/18  8:42 PM  Result Value Ref Range   Magnesium 1.9 1.7 - 2.4 mg/dL    Comment: Performed at [facility], [place] [address] [place], [place] [address]  Phosphorus     Status: Abnormal   Collection Time: 04/07/18  8:42 PM  Result Value Ref Range   Phosphorus 2.0 (L) 2.5 - 4.6 mg/dL    Comment: Performed at [facility], [place] [address] [place], [place] [address]  CBC with  Differential/Platelet     Status: Abnormal   Collection Time: 04/07/18  8:42 PM  Result Value Ref Range   WBC 7.5 4.0 - 10.5 K/uL   RBC 2.14 (L) 3.87 - 5.11 MIL/uL   Hemoglobin 5.7 (LL) 12.0 - 15.0 g/dL    Comment: REPEATED TO VERIFY THIS CRITICAL RESULT HAS VERIFIED AND BEEN CALLED TO A.HEAVNER BY NATHAN THOMPSON ON 03 09 2020 AT 2206, AND HAS BEEN READ BACK. CRITICAL RESULT VERIFIED    HCT 18.4 (L) 36.0 - 46.0 %   MCV 86.0 80.0 - 100.0 fL   MCH 26.6 26.0 - 34.0 pg   MCHC 31.0 30.0 - 36.0 g/dL   RDW 24.5 (H) 11.5 - 15.5 %   Platelets 109 (L) 150 - 400 K/uL    Comment: REPEATED TO VERIFY PLATELET COUNT CONFIRMED BY SMEAR SPECIMEN CHECKED FOR CLOTS Immature Platelet Fraction may be clinically indicated, consider ordering this additional test [id number]    nRBC 6.4 (H) 0.0 - 0.2 %   Neutrophils Relative % 43 %   Neutro Abs 3.6 1.7 - 7.7 K/uL   Band Neutrophils 5 %   Lymphocytes Relative 18 %   Lymphs Abs 1.4 0.7 - 4.0 K/uL   Monocytes Relative 27 %   Monocytes Absolute  2.0 (H) 0.1 - 1.0 K/uL   Eosinophils Relative 0 %   Eosinophils Absolute 0.0 0.0 - 0.5 K/uL   Basophils Relative 0 %   Basophils Absolute 0.0 0.0 - 0.1 K/uL   WBC Morphology      MARKED LEFT SHIFT (>5% METAS,MYELOS AND PROS, OCC BLAST NOTED)   RBC Morphology RARE NRBC'S    Metamyelocytes Relative 1 %   Myelocytes 4 %   Blasts 2 %   Abs Immature Granulocytes 0.40 (H) 0.00 - 0.07 K/uL   Acanthocytes PRESENT    Polychromasia PRESENT     Comment: Performed at [facility], [place] [address] [place], [place] [address]  Comprehensive metabolic panel     Status: Abnormal   Collection Time: 04/07/18  8:42 PM  Result Value Ref Range   Sodium 133 (L) 135 - 145 mmol/L   Potassium 2.9 (L) 3.5 - 5.1 mmol/L   Chloride 106 98 - 111 mmol/L   CO2 18 (L) 22 - 32 mmol/L   Glucose, Bld 258 (H) 70 - 99 mg/dL   BUN 11 6 - 20 mg/dL   Creatinine, Ser 0.82 0.44 - 1.00 mg/dL   Calcium 7.3 (L) 8.9 - 10.3 mg/dL     Total Protein 5.7 (L) 6.5 - 8.1 g/dL   Albumin 2.6 (L) 3.5 - 5.0 g/dL   AST 11 (L) 15 - 41 U/L   ALT 12 0 - 44 U/L   Alkaline Phosphatase 64 38 - 126 U/L   Total Bilirubin 1.8 (H) 0.3 - 1.2 mg/dL   GFR calc non Af Amer >60 >60 mL/min   GFR calc Af Amer >60 >60 mL/min   Anion gap 9 5 - 15    Comment: Performed at [facility], [place] [address], [place], [place] [address]  Lactic acid, plasma     Status: None   Collection Time: 04/07/18  8:42 PM  Result Value Ref Range   Lactic Acid, Venous 1.0 0.5 - 1.9 mmol/L    Comment: Performed at [facility], [place] [address], [place], [place] [address]  Procalcitonin     Status: None   Collection Time: 04/07/18  8:42 PM  Result Value Ref Range   Procalcitonin 14.25 ng/mL    Comment:        Interpretation: PCT >= 10 ng/mL: Important systemic inflammatory response, almost exclusively due to severe bacterial sepsis or septic shock. (NOTE)       Sepsis PCT Algorithm           Lower Respiratory Tract                                      Infection PCT Algorithm    ----------------------------     ----------------------------         PCT < 0.25 ng/mL                PCT < 0.10 ng/mL         Strongly encourage             Strongly discourage   discontinuation of antibiotics    initiation of antibiotics    ----------------------------     -----------------------------       PCT 0.25 - 0.50 ng/mL            PCT 0.10 - 0.25 ng/mL  OR       >80% decrease in PCT            Discourage initiation of                                            antibiotics      Encourage discontinuation           of antibiotics    ----------------------------     -----------------------------         PCT >= 0.50 ng/mL              PCT 0.26 - 0.50 ng/mL                AND       <80% decrease in PCT             Encourage initiation of                                             antibiotics       Encourage continuation            of antibiotics    ----------------------------     -----------------------------        PCT >= 0.50 ng/mL                  PCT > 0.50 ng/mL               AND         increase in PCT                  Strongly encourage                                      initiation of antibiotics    Strongly encourage escalation           of antibiotics                                     -----------------------------                                           PCT <= 0.25 ng/mL                                                 OR                                        > 80% decrease in PCT                                     Discontinue / Do not initiate  antibiotics Performed at [facility], [place] [address], [place], [place] [address]   Protime-INR     Status: Abnormal   Collection Time: 04/07/18  8:42 PM  Result Value Ref Range   Prothrombin Time 18.8 (H) 11.4 - 15.2 seconds   INR 1.6 (H) 0.8 - 1.2    Comment: (NOTE) INR goal varies based on device and disease states. Performed at [facility], [place] [address] [place], [place] [address]   APTT     Status: Abnormal   Collection Time: 04/07/18  8:42 PM  Result Value Ref Range   aPTT 45 (H) 24 - 36 seconds    Comment:        IF BASELINE aPTT IS ELEVATED, SUGGEST PATIENT RISK ASSESSMENT BE USED TO DETERMINE APPROPRIATE ANTICOAGULANT THERAPY. Performed at [facility], [place] [address] [place], [place] [address]   Glucose, capillary     Status: Abnormal   Collection Time: 04/07/18  9:17 PM  Result Value Ref Range   Glucose-Capillary 259 (H) 70 - 99 mg/dL  Glucose, capillary     Status: Abnormal   Collection Time: 04/07/18 11:04 PM  Result Value Ref Range   Glucose-Capillary 215 (H) 70 - 99 mg/dL  Respiratory Panel by PCR     Status: None   Collection Time: 04/08/18  1:06 AM  Result Value Ref Range   Adenovirus NOT DETECTED NOT DETECTED    Coronavirus 229E NOT DETECTED NOT DETECTED    Comment: (NOTE) The Coronavirus on the Respiratory Panel, DOES NOT test for the novel  Coronavirus (2019 nCoV)    Coronavirus HKU1 NOT DETECTED NOT DETECTED   Coronavirus NL63 NOT DETECTED NOT DETECTED   Coronavirus OC43 NOT DETECTED NOT DETECTED   Metapneumovirus NOT DETECTED NOT DETECTED   Rhinovirus / Enterovirus NOT DETECTED NOT DETECTED   Influenza A NOT DETECTED NOT DETECTED   Influenza B NOT DETECTED NOT DETECTED   Parainfluenza Virus 1 NOT DETECTED NOT DETECTED   Parainfluenza Virus 2 NOT DETECTED NOT DETECTED   Parainfluenza Virus 3 NOT DETECTED NOT DETECTED   Parainfluenza Virus 4 NOT DETECTED NOT DETECTED   Respiratory Syncytial Virus NOT DETECTED NOT DETECTED   Bordetella pertussis NOT DETECTED NOT DETECTED   Chlamydophila pneumoniae NOT DETECTED NOT DETECTED   Mycoplasma pneumoniae NOT DETECTED NOT DETECTED    Comment: Performed at [facility], [place] [address] [place], [place] [address]  Hemoglobin A1c     Status: Abnormal   Collection Time: 04/08/18  2:16 AM  Result Value Ref Range   Hgb A1c MFr Bld 7.5 (H) 4.8 - 5.6 %    Comment: (NOTE) Pre diabetes:          5.7%-6.4% Diabetes:              >6.4% Glycemic control for   <7.0% adults with diabetes    Mean Plasma Glucose 168.55 mg/dL    Comment: Performed at [facility] [address], [place], [place] [address]  Magnesium     Status: None   Collection Time: 04/08/18  2:16 AM  Result Value Ref Range   Magnesium 1.8 1.7 - 2.4 mg/dL    Comment: Performed at [facility], [place] [address], [place],  [address]  Phosphorus     Status: Abnormal   Collection Time: 04/08/18  2:16 AM  Result Value Ref Range   Phosphorus 2.2 (L) 2.5 - 4.6 mg/dL    Comment: Performed at  [facility], [place] [address], [place], [place] [address]  TSH     Status: None   Collection Time: 04/08/18  2:16 AM  Result Value Ref Range   TSH  0.962 0.350 - 4.500 uIU/mL    Comment: Performed by a 3rd Generation assay with a functional sensitivity of <=0.01 uIU/mL. Performed at [facility], [place] [address], [place], [place] [address]   Comprehensive metabolic panel     Status: Abnormal   Collection Time: 04/08/18  2:16 AM  Result Value Ref Range   Sodium 136 135 - 145 mmol/L   Potassium 3.5 3.5 - 5.1 mmol/L    Comment: DELTA CHECK NOTED   Chloride 110 98 - 111 mmol/L   CO2 19 (L) 22 - 32 mmol/L   Glucose, Bld 212 (H) 70 - 99 mg/dL   BUN 13 6 - 20 mg/dL   Creatinine, Ser 0.85 0.44 - 1.00 mg/dL   Calcium 7.1 (L) 8.9 - 10.3 mg/dL   Total Protein 5.7 (L) 6.5 - 8.1 g/dL   Albumin 2.6 (L) 3.5 - 5.0 g/dL   AST 24 15 - 41 U/L   ALT 16 0 - 44 U/L   Alkaline Phosphatase 59 38 - 126 U/L   Total Bilirubin 3.3 (H) 0.3 - 1.2 mg/dL   GFR calc non Af Amer >60 >60 mL/min   GFR calc Af Amer >60 >60 mL/min   Anion gap 7 5 - 15    Comment: Performed at [facility], [place] [address] [place], [place] [address]  CBC     Status: Abnormal   Collection Time: 04/08/18  2:16 AM  Result Value Ref Range   WBC 9.1 4.0 - 10.5 K/uL   RBC 2.80 (L) 3.87 - 5.11 MIL/uL   Hemoglobin 7.6 (L) 12.0 - 15.0 g/dL    Comment: REPEATED TO VERIFY POST TRANSFUSION SPECIMEN DELTA CHECK NOTED    HCT 24.0 (L) 36.0 - 46.0 %   MCV 85.7 80.0 - 100.0 fL   MCH 27.1 26.0 - 34.0 pg   MCHC 31.7 30.0 - 36.0 g/dL   RDW 21.5 (H) 11.5 - 15.5 %   Platelets 107 (L) 150 - 400 K/uL    Comment: Immature Platelet Fraction may be clinically indicated, consider ordering this additional test [id number] CONSISTENT WITH PREVIOUS RESULT    nRBC 3.8 (H) 0.0 - 0.2 %    Comment: Performed at [facility], [place] [address], [place], [place] [address]  Transfusion reaction     Status: None (Preliminary result)   Collection Time: 04/08/18  2:16 AM  Result Value Ref Range   Post RXN DAT IgG NEG    DAT C3 NEG    Path interp tx rxn       No laboratory evidence of hemolytic transfusion reaction. Probable, febrile reaction although nausea and vomiting are not typical. Additional transfusions approved. Per Dr Claudette Laws, Performed at [facility], [place] [address], [place],  [address]   Glucose, capillary     Status: Abnormal   Collection Time: 04/08/18  3:17 AM  Result Value Ref Range   Glucose-Capillary 179 (H) 70 - 99 mg/dL  Blood gas, arterial     Status: Abnormal   Collection Time: 04/08/18  5:45 AM  Result Value Ref Range   FIO2 60.00    Delivery systems BILEVEL POSITIVE AIRWAY PRESSURE    Mode BILEVEL POSITIVE AIRWAY PRESSURE    Inspiratory PAP 14.0  Expiratory PAP 6.0    pH, Arterial 7.402 7.350 - 7.450   pCO2 arterial 31.1 (L) 32.0 - 48.0 mmHg   pO2, Arterial 95.1 83.0 - 108.0 mmHg   Bicarbonate 19.0 (L) 20.0 - 28.0 mmol/L   Acid-base deficit 4.4 (H) 0.0 - 2.0 mmol/L   O2 Saturation 97.5 %   Patient temperature 98.6    Collection site RIGHT RADIAL    Drawn by [id number]    Sample type ARTERIAL DRAW    Allens test (pass/fail) PASS PASS    Comment: Performed at [facility], [place] [address], [place], [place] [address]  Glucose, capillary     Status: Abnormal   Collection Time: 04/08/18  8:01 AM  Result Value Ref Range   Glucose-Capillary 186 (H) 70 - 99 mg/dL   Comment 1 Notify RN    Comment 2 Document in Chart   Glucose, capillary     Status: Abnormal   Collection Time: 04/08/18 11:29 AM  Result Value Ref Range   Glucose-Capillary 144 (H) 70 - 99 mg/dL   Comment 1 Notify RN    Comment 2 Document in Chart       Component Value Date/Time   SDES  04/07/2018 1215    PORTA CATH Performed at [facility], [place] [address] [place], [place] [address]    SPECREQUEST  04/07/2018 1215    BOTTLES DRAWN AEROBIC AND ANAEROBIC Blood Culture adequate volume   CULT  04/07/2018 1215    NO GROWTH < 24 HOURS Performed at [facility],  [place] [address], [place], [place] [address]    REPTSTATUS PENDING 04/07/2018 1215   Dg Chest Port 1 View  Result Date: 04/07/2018 CLINICAL DATA:  Breast cancer with metastases.  Fever. EXAM: PORTABLE CHEST 1 VIEW COMPARISON:  CT scan March 11, 2018 FINDINGS: There is an oval hazy opacity in the right upper lobe which correlates with the site of a previously identified nodule, likely a metastatic lesion. No other definitive pulmonary nodules. The heart, hila, and mediastinum are unremarkable. No pneumothorax. The right Port-A-Cath terminates in the SVC. No other acute abnormalities. IMPRESSION: 1. The ill-defined hazy rounded opacity in the right upper lobe correlates with a previously identified nodule thought to be a metastatic lesion. No other abnormalities identified on today's study. Electronically Signed   By: Dorise Bullion III M.D   On: 04/07/2018 16:54   Recent Results (from the past 240 hour(s))  Urine Culture     Status: Abnormal (Preliminary result)   Collection Time: 04/07/18 11:15 AM  Result Value Ref Range Status   Specimen Description   Final    URINE, CLEAN CATCH Performed at [facility], [address] [place], [place] [address]    Special Requests   Final    NONE Performed at [facility], [place] [address] [place], [place] [address]    Culture >=100,000 COLONIES/mL GRAM NEGATIVE RODS (A)  Final   Report Status PENDING  Incomplete  Culture, Blood     Status: None (Preliminary result)   Collection Time: 04/07/18 11:27 AM  Result Value Ref Range Status   Specimen Description   Final    BLOOD RIGHT ARM Performed at [facility], [place] [address] [place], [place] [address]    Special Requests   Final    BOTTLES DRAWN AEROBIC AND ANAEROBIC Blood Culture results may not be optimal due to an excessive volume of blood received  in culture bottles   Culture   Final    NO GROWTH < 24 HOURS Performed at  [facility], [place] [address], [place], [place] [address]    Report Status PENDING  Incomplete  Culture, Blood     Status: None (Preliminary result)   Collection Time: 04/07/18 12:15 PM  Result Value Ref Range Status   Specimen Description   Final    PORTA CATH Performed at [facility], [place] [address], [place], [place] [address]    Special Requests   Final    BOTTLES DRAWN AEROBIC AND ANAEROBIC Blood Culture adequate volume   Culture   Final    NO GROWTH < 24 HOURS Performed at [facility], [place] [address] [place], [place] [address]    Report Status PENDING  Incomplete  MRSA PCR Screening     Status: None   Collection Time: 04/07/18  8:41 PM  Result Value Ref Range Status   MRSA by PCR NEGATIVE NEGATIVE Final    Comment:        The GeneXpert MRSA Assay (FDA approved for NASAL specimens only), is one component of a comprehensive MRSA colonization surveillance program. It is not intended to diagnose MRSA infection nor to guide or monitor treatment for MRSA infections. Performed at [facility], [place] [address], [place], [place] [address]   Respiratory Panel by PCR     Status: None   Collection Time: 04/08/18  1:06 AM  Result Value Ref Range Status   Adenovirus NOT DETECTED NOT DETECTED Final   Coronavirus 229E NOT DETECTED NOT DETECTED Final    Comment: (NOTE) The Coronavirus on the Respiratory Panel, DOES NOT test for the novel  Coronavirus (2019 nCoV)    Coronavirus HKU1 NOT DETECTED NOT DETECTED Final   Coronavirus NL63 NOT DETECTED NOT DETECTED Final   Coronavirus OC43 NOT DETECTED NOT DETECTED Final   Metapneumovirus NOT DETECTED NOT DETECTED Final   Rhinovirus / Enterovirus NOT DETECTED NOT DETECTED Final   Influenza A NOT DETECTED NOT DETECTED Final   Influenza B NOT DETECTED NOT DETECTED Final   Parainfluenza Virus 1 NOT DETECTED NOT DETECTED Final   Parainfluenza Virus 2 NOT DETECTED NOT DETECTED Final    Parainfluenza Virus 3 NOT DETECTED NOT DETECTED Final   Parainfluenza Virus 4 NOT DETECTED NOT DETECTED Final   Respiratory Syncytial Virus NOT DETECTED NOT DETECTED Final   Bordetella pertussis NOT DETECTED NOT DETECTED Final   Chlamydophila pneumoniae NOT DETECTED NOT DETECTED Final   Mycoplasma pneumoniae NOT DETECTED NOT DETECTED Final    Comment: Performed at [facility], [address] [place], [place] [address]    Microbiology: Recent Results (from the past 240 hour(s))  Urine Culture     Status: Abnormal (Preliminary result)   Collection Time: 04/07/18 11:15 AM  Result Value Ref Range Status   Specimen Description   Final    URINE, CLEAN CATCH Performed at [facility], [address], [place], [place] [address]    Special Requests   Final    NONE Performed at [facility], [place] [address], [place], [place] [address]    Culture >=100,000 COLONIES/mL GRAM NEGATIVE RODS (A)  Final   Report Status PENDING  Incomplete  Culture, Blood     Status: None (Preliminary result)   Collection Time: 04/07/18 11:27 AM  Result Value Ref Range Status   Specimen Description   Final  BLOOD RIGHT ARM Performed at [facility], [place] [address], [place], [place] [address]    Special Requests   Final    BOTTLES DRAWN AEROBIC AND ANAEROBIC Blood Culture results may not be optimal due to an excessive volume of blood received in culture bottles   Culture   Final    NO GROWTH < 24 HOURS Performed at [place] [address] [place], [place] [address]    Report Status PENDING  Incomplete  Culture, Blood     Status: None (Preliminary result)   Collection Time: 04/07/18 12:15 PM  Result Value Ref Range Status   Specimen Description   Final    PORTA CATH Performed at [facility], [place] [address], [place], [place] [address]    Special Requests   Final    BOTTLES DRAWN AEROBIC  AND ANAEROBIC Blood Culture adequate volume   Culture   Final    NO GROWTH < 24 HOURS Performed at [facility], [place] [address] [place], [place] [address]    Report Status PENDING  Incomplete  MRSA PCR Screening     Status: None   Collection Time: 04/07/18  8:41 PM  Result Value Ref Range Status   MRSA by PCR NEGATIVE NEGATIVE Final    Comment:        The GeneXpert MRSA Assay (FDA approved for NASAL specimens only), is one component of a comprehensive MRSA colonization surveillance program. It is not intended to diagnose MRSA infection nor to guide or monitor treatment for MRSA infections. Performed at [facility], [place] [address], [place], [place] [address]   Respiratory Panel by PCR     Status: None   Collection Time: 04/08/18  1:06 AM  Result Value Ref Range Status   Adenovirus NOT DETECTED NOT DETECTED Final   Coronavirus 229E NOT DETECTED NOT DETECTED Final    Comment: (NOTE) The Coronavirus on the Respiratory Panel, DOES NOT test for the novel  Coronavirus (2019 nCoV)    Coronavirus HKU1 NOT DETECTED NOT DETECTED Final   Coronavirus NL63 NOT DETECTED NOT DETECTED Final   Coronavirus OC43 NOT DETECTED NOT DETECTED Final   Metapneumovirus NOT DETECTED NOT DETECTED Final   Rhinovirus / Enterovirus NOT DETECTED NOT DETECTED Final   Influenza A NOT DETECTED NOT DETECTED Final   Influenza B NOT DETECTED NOT DETECTED Final   Parainfluenza Virus 1 NOT DETECTED NOT DETECTED Final   Parainfluenza Virus 2 NOT DETECTED NOT DETECTED Final   Parainfluenza Virus 3 NOT DETECTED NOT DETECTED Final   Parainfluenza Virus 4 NOT DETECTED NOT DETECTED Final   Respiratory Syncytial Virus NOT DETECTED NOT DETECTED Final   Bordetella pertussis NOT DETECTED NOT DETECTED Final   Chlamydophila pneumoniae NOT DETECTED NOT DETECTED Final   Mycoplasma pneumoniae NOT DETECTED NOT DETECTED Final    Comment: Performed at [facility], [address]  [place], [place] [address]    Radiographs and labs were personally reviewed by me.   Bobby Rumpf, MD [facility] [phone] 04/08/2018, 12:32 PM

## 2018-04-08 NOTE — Progress Notes (Signed)
Pt began to develop chills during initial 15 minutes of second unit of blood. Offered to check temperate. Reading 100.7, and increase from 99.6 ten minutes prior. E-link notified of temperature increase. Unsure whether rise in temp due to transfusion reaction or infectious process. Temp at the end of initial 15 minutes 100.3. Instructed to recheck temperature in approximately 30 minutes. Follow-up temperature reading 102.5. Pt has increased respirations, but does not appear to be in distress. [facility] notified. Blood stopped at 0115. Blood bank notified of potrential reaction. Pt medicated with Tylenol. Ice packs placed. Current BP 122/57 with MAP of 72, HR 117, RR 46 with O2 @ 88% on 3L nasal cannula. Will continue to closely monitor patient at this time.

## 2018-04-08 NOTE — Telephone Encounter (Signed)
I saw her this morning and planning to stop by again at 12 pm

## 2018-04-08 NOTE — Progress Notes (Signed)
Spoke with family members (sister and mother) and reviewed plan of care with nursing staff She has cooling blanket and maximum dose of Tylenol for fever She is allergic to NSAID and has indicated inability to take aspirin For now, continue on best supportive care along with board spectrum antiboitics Hold off further transfusion support until fever resolves

## 2018-04-08 NOTE — Progress Notes (Signed)
Contacted MD [place] Crane for clarification of Cardizem gtt and additional IV Tylenol.  IV Tylenol order changed to PRN for, Cardizem gtt on standby if HR remains >120 and BP map goal >65 able to tolerate gtt.

## 2018-04-08 NOTE — Progress Notes (Signed)
NAME:  Stacy Shelton, MRN:  [id number], DOB:  1960-07-05, LOS: 0 ADMISSION DATE:  04/07/2018, CONSULTATION DATE:  04/07/18 REFERRING MD:  Roel Cluck, CHIEF COMPLAINT: hypotension  Brief History   58 yo WF widely metastatic breast cancer (to bone, liver) on chemo (last treatment 03/26/18) admitted 3/9 fever and acute onset of explosive diarrhea for the past 24-48h.  Hypotesnion on admit with Hgb 5.7, initially required vasopressors but weaned off after transfusion. Required O2 for saturations into the 80's.  Required intermittent BiPAP  Past Medical History  Metastatic [facility]   3/09  Admit  3/10  Off pressors, intermittent bipap.  Febrile.   Consults:  PCCM   Procedures:    Significant Diagnostic Tests:    Micro Data:  BCx2 3/9 (cancer center) >>  GI PCR 3/10 >>  RVP 3/10 >> negative   Antimicrobials:  Vanco 3/9 >>  Cefepime 3/9 >>  Flagyl 3/9 >>   Interim history/subjective:  RN reports ongoing fever > cooling blanket applied.  Pt continues to report diarrhea.   Objective    Blood pressure (!) 109/46, pulse (!) 109, temperature (!) 101.4 F (38.6 C), resp. rate (!) 29, height 5\' 2"  (1.575 m), weight 67.1 kg, last menstrual period 10/30/2010, SpO2 97 %.  Exam: General:  Adult female lying in bed on BiPAP in NAD HEENT: MM pink/dry, BiPAP mask in place Neuro: AAOx4, speech clear, appropriate / MAE  CV: s1s2 rrr, no m/r/g PULM: even/non-labored, lungs bilaterally clear  WI:OXBD, non-tender, bsx4 hypoactive  Extremities: warm/dry, no edema  Skin: no rashes or lesions  Labs   CBC: Recent Labs  Lab 04/07/18 1141 04/07/18 2042 04/08/18 0216  WBC 11.1* 7.5 9.1  NEUTROABS 4.2 3.6  --   HGB 7.2* 5.7* 7.6*  HCT 22.9* 18.4* 24.0*  MCV 83.3 86.0 85.7  PLT 151 109* 107*    Basic Metabolic Panel: Recent Labs  Lab 04/07/18 1143 04/07/18 2042 04/08/18 0216  NA 132* 135  133* 136  K 2.7* 3.0*  2.9* 3.5  CL 95* 106  106 110  CO2  22 17*  18* 19*  GLUCOSE 223* 262*  258* 212*  BUN 10 12  11 13   CREATININE 0.78 0.83  0.82 0.85  CALCIUM 8.7* 7.5*  7.3* 7.1*  MG 1.3* 1.9 1.8  PHOS  --  2.0* 2.2*   GFR: Estimated Creatinine Clearance: 64.8 mL/min (by C-G formula based on SCr of 0.85 mg/dL). Recent Labs  Lab 04/07/18 1141 04/07/18 1626 04/07/18 2042 04/08/18 0216  PROCALCITON  --   --  14.25  --   WBC 11.1*  --  7.5 9.1  LATICACIDVEN  --  1.9 1.0  --     Liver Function Tests: Recent Labs  Lab 04/07/18 1143 04/07/18 2042 04/08/18 0216  AST 15 11* 24  ALT 13 12 16   ALKPHOS 92 64 59  BILITOT 1.5* 1.8* 3.3*  PROT 7.4 5.7* 5.7*  ALBUMIN 3.7 2.6* 2.6*   No results for input(s): LIPASE, AMYLASE in the last 168 hours. No results for input(s): AMMONIA in the last 168 hours.  ABG    Component Value Date/Time   PHART 7.402 04/08/2018 0545   PCO2ART 31.1 (L) 04/08/2018 0545   PO2ART 95.1 04/08/2018 0545   HCO3 19.0 (L) 04/08/2018 0545   ACIDBASEDEF 4.4 (H) 04/08/2018 0545   O2SAT 97.5 04/08/2018 0545     Coagulation Profile: Recent Labs  Lab 04/07/18 2042  INR 1.6*  Cardiac Enzymes: No results for input(s): CKTOTAL, CKMB, CKMBINDEX, TROPONINI in the last 168 hours.  HbA1C: Hgb A1c MFr Bld  Date/Time Value Ref Range Status  04/08/2018 02:16 AM 7.5 (H) 4.8 - 5.6 % Final    Comment:    (NOTE) Pre diabetes:          5.7%-6.4% Diabetes:              >6.4% Glycemic control for   <7.0% adults with diabetes   03/10/2018 11:50 PM 12.9 (H) 4.8 - 5.6 % Final    Comment:    (NOTE) Pre diabetes:          5.7%-6.4% Diabetes:              >6.4% Glycemic control for   <7.0% adults with diabetes     CBG: Recent Labs  Lab 04/07/18 2117 04/07/18 2304 04/08/18 0317 04/08/18 0801  GLUCAP 259* 215* 179* 186*    Results for orders placed or performed during the hospital encounter of 04/07/18 (from the past 48 hour(s))  Prepare RBC     Status: None   Collection Time: 04/07/18  1:10 PM    Result Value Ref Range   Order Confirmation      ORDER PROCESSED BY BLOOD BANK Performed at [facility], [place] [address], [place], [place] [address]   Type and screen [place]     Status: None (Preliminary result)   Collection Time: 04/07/18  1:10 PM  Result Value Ref Range   ABO/RH(D) A POS    Antibody Screen NEG    Sample Expiration 04/10/2018    Unit Number [id number]    Blood Component Type RED CELLS,LR    Unit division 00    Status of Unit ISSUED    Transfusion Status OK TO TRANSFUSE    Crossmatch Result Compatible    Unit Number [id number]    Blood Component Type RBC LR PHER1    Unit division 00    Status of Unit ISSUED,FINAL    Transfusion Status OK TO TRANSFUSE    Crossmatch Result      Compatible Performed at [facility], [place] [address] [place], [place] [address]   Lactic acid, plasma     Status: None   Collection Time: 04/07/18  4:26 PM  Result Value Ref Range   Lactic Acid, Venous 1.9 0.5 - 1.9 mmol/L    Comment: Performed at [facility], [place] [address], [place], [place] [address]  MRSA PCR Screening     Status: None   Collection Time: 04/07/18  8:41 PM  Result Value Ref Range   MRSA by PCR NEGATIVE NEGATIVE    Comment:        The GeneXpert MRSA Assay (FDA approved for NASAL specimens only), is one component of a comprehensive MRSA colonization surveillance program. It is not intended to diagnose MRSA infection nor to guide or monitor treatment for MRSA infections. Performed at [name], [place] [address], [place], [place] [address]   Basic metabolic panel     Status: Abnormal   Collection Time: 04/07/18  8:42 PM  Result Value Ref Range   Sodium 135 135 - 145 mmol/L   Potassium 3.0 (L) 3.5 - 5.1 mmol/L   Chloride 106 98 - 111 mmol/L   CO2 17 (L) 22 - 32 mmol/L   Glucose, Bld 262 (H) 70 - 99 mg/dL   BUN 12 6 - 20 mg/dL  Creatinine, Ser 0.83  0.44 - 1.00 mg/dL   Calcium 7.5 (L) 8.9 - 10.3 mg/dL   GFR calc non Af Amer >60 >60 mL/min   GFR calc Af Amer >60 >60 mL/min   Anion gap 12 5 - 15    Comment: Performed at [facility], [place] [address], [place], [place] [address]  Magnesium     Status: None   Collection Time: 04/07/18  8:42 PM  Result Value Ref Range   Magnesium 1.9 1.7 - 2.4 mg/dL    Comment: Performed at [facility], [place] [address] [place], [place] [address]  Phosphorus     Status: Abnormal   Collection Time: 04/07/18  8:42 PM  Result Value Ref Range   Phosphorus 2.0 (L) 2.5 - 4.6 mg/dL    Comment: Performed at [facility], [place] [address], [place], [place] [address]  CBC with Differential/Platelet     Status: Abnormal   Collection Time: 04/07/18  8:42 PM  Result Value Ref Range   WBC 7.5 4.0 - 10.5 K/uL   RBC 2.14 (L) 3.87 - 5.11 MIL/uL   Hemoglobin 5.7 (LL) 12.0 - 15.0 g/dL    Comment: REPEATED TO VERIFY THIS CRITICAL RESULT HAS VERIFIED AND BEEN CALLED TO A.HEAVNER BY NATHAN THOMPSON ON 03 09 2020 AT 2206, AND HAS BEEN READ BACK. CRITICAL RESULT VERIFIED    HCT 18.4 (L) 36.0 - 46.0 %   MCV 86.0 80.0 - 100.0 fL   MCH 26.6 26.0 - 34.0 pg   MCHC 31.0 30.0 - 36.0 g/dL   RDW 24.5 (H) 11.5 - 15.5 %   Platelets 109 (L) 150 - 400 K/uL    Comment: REPEATED TO VERIFY PLATELET COUNT CONFIRMED BY SMEAR SPECIMEN CHECKED FOR CLOTS Immature Platelet Fraction may be clinically indicated, consider ordering this additional test [id number]    nRBC 6.4 (H) 0.0 - 0.2 %   Neutrophils Relative % 43 %   Neutro Abs 3.6 1.7 - 7.7 K/uL   Band Neutrophils 5 %   Lymphocytes Relative 18 %   Lymphs Abs 1.4 0.7 - 4.0 K/uL   Monocytes Relative 27 %   Monocytes Absolute 2.0 (H) 0.1 - 1.0 K/uL   Eosinophils Relative 0 %   Eosinophils Absolute 0.0 0.0 - 0.5 K/uL   Basophils Relative 0 %   Basophils Absolute 0.0 0.0 - 0.1 K/uL   WBC Morphology      MARKED LEFT SHIFT (>5%  METAS,MYELOS AND PROS, OCC BLAST NOTED)   RBC Morphology RARE NRBC'S    Metamyelocytes Relative 1 %   Myelocytes 4 %   Blasts 2 %   Abs Immature Granulocytes 0.40 (H) 0.00 - 0.07 K/uL   Acanthocytes PRESENT    Polychromasia PRESENT     Comment: Performed at [facility], [place] [address], [place], [place] [address]  Comprehensive metabolic panel     Status: Abnormal   Collection Time: 04/07/18  8:42 PM  Result Value Ref Range   Sodium 133 (L) 135 - 145 mmol/L   Potassium 2.9 (L) 3.5 - 5.1 mmol/L   Chloride 106 98 - 111 mmol/L   CO2 18 (L) 22 - 32 mmol/L   Glucose, Bld 258 (H) 70 - 99 mg/dL   BUN 11 6 - 20 mg/dL   Creatinine, Ser 0.82 0.44 - 1.00 mg/dL   Calcium 7.3 (L) 8.9 - 10.3 mg/dL   Total Protein 5.7 (L) 6.5 - 8.1 g/dL   Albumin 2.6 (  L) 3.5 - 5.0 g/dL   AST 11 (L) 15 - 41 U/L   ALT 12 0 - 44 U/L   Alkaline Phosphatase 64 38 - 126 U/L   Total Bilirubin 1.8 (H) 0.3 - 1.2 mg/dL   GFR calc non Af Amer >60 >60 mL/min   GFR calc Af Amer >60 >60 mL/min   Anion gap 9 5 - 15    Comment: Performed at [facility], [place] [address], [place], [place] [address]  Lactic acid, plasma     Status: None   Collection Time: 04/07/18  8:42 PM  Result Value Ref Range   Lactic Acid, Venous 1.0 0.5 - 1.9 mmol/L    Comment: Performed at [facility], [place] [address] [place], [place] [address]  Procalcitonin     Status: None   Collection Time: 04/07/18  8:42 PM  Result Value Ref Range   Procalcitonin 14.25 ng/mL    Comment:        Interpretation: PCT >= 10 ng/mL: Important systemic inflammatory response, almost exclusively due to severe bacterial sepsis or septic shock. (NOTE)       Sepsis PCT Algorithm           Lower Respiratory Tract                                      Infection PCT Algorithm    ----------------------------     ----------------------------         PCT < 0.25 ng/mL                PCT < 0.10 ng/mL         Strongly  encourage             Strongly discourage   discontinuation of antibiotics    initiation of antibiotics    ----------------------------     -----------------------------       PCT 0.25 - 0.50 ng/mL            PCT 0.10 - 0.25 ng/mL               OR       >80% decrease in PCT            Discourage initiation of                                            antibiotics      Encourage discontinuation           of antibiotics    ----------------------------     -----------------------------         PCT >= 0.50 ng/mL              PCT 0.26 - 0.50 ng/mL                AND       <80% decrease in PCT             Encourage initiation of                                             antibiotics       Encourage  continuation           of antibiotics    ----------------------------     -----------------------------        PCT >= 0.50 ng/mL                  PCT > 0.50 ng/mL               AND         increase in PCT                  Strongly encourage                                      initiation of antibiotics    Strongly encourage escalation           of antibiotics                                     -----------------------------                                           PCT <= 0.25 ng/mL                                                 OR                                        > 80% decrease in PCT                                     Discontinue / Do not initiate                                             antibiotics Performed at [place] [address], [place], [place] [address]   Protime-INR     Status: Abnormal   Collection Time: 04/07/18  8:42 PM  Result Value Ref Range   Prothrombin Time 18.8 (H) 11.4 - 15.2 seconds   INR 1.6 (H) 0.8 - 1.2    Comment: (NOTE) INR goal varies based on device and disease states. Performed at [facility], [place] [address] [place], [place] [address]   APTT     Status: Abnormal   Collection Time: 04/07/18  8:42  PM  Result Value Ref Range   aPTT 45 (H) 24 - 36 seconds    Comment:        IF BASELINE aPTT IS ELEVATED, SUGGEST PATIENT RISK ASSESSMENT BE USED TO DETERMINE APPROPRIATE ANTICOAGULANT THERAPY. Performed at [facility], [place] [address] [place], [place] [address]   Glucose, capillary     Status: Abnormal   Collection Time: 04/07/18  9:17 PM  Result Value Ref Range   Glucose-Capillary 259 (H) 70 -  99 mg/dL  Glucose, capillary     Status: Abnormal   Collection Time: 04/07/18 11:04 PM  Result Value Ref Range   Glucose-Capillary 215 (H) 70 - 99 mg/dL  Respiratory Panel by PCR     Status: None   Collection Time: 04/08/18  1:06 AM  Result Value Ref Range   Adenovirus NOT DETECTED NOT DETECTED   Coronavirus 229E NOT DETECTED NOT DETECTED    Comment: (NOTE) The Coronavirus on the Respiratory Panel, DOES NOT test for the novel  Coronavirus (2019 nCoV)    Coronavirus HKU1 NOT DETECTED NOT DETECTED   Coronavirus NL63 NOT DETECTED NOT DETECTED   Coronavirus OC43 NOT DETECTED NOT DETECTED   Metapneumovirus NOT DETECTED NOT DETECTED   Rhinovirus / Enterovirus NOT DETECTED NOT DETECTED   Influenza A NOT DETECTED NOT DETECTED   Influenza B NOT DETECTED NOT DETECTED   Parainfluenza Virus 1 NOT DETECTED NOT DETECTED   Parainfluenza Virus 2 NOT DETECTED NOT DETECTED   Parainfluenza Virus 3 NOT DETECTED NOT DETECTED   Parainfluenza Virus 4 NOT DETECTED NOT DETECTED   Respiratory Syncytial Virus NOT DETECTED NOT DETECTED   Bordetella pertussis NOT DETECTED NOT DETECTED   Chlamydophila pneumoniae NOT DETECTED NOT DETECTED   Mycoplasma pneumoniae NOT DETECTED NOT DETECTED    Comment: Performed at [facility], [address] [place], [place] [address]  Hemoglobin A1c     Status: Abnormal   Collection Time: 04/08/18  2:16 AM  Result Value Ref Range   Hgb A1c MFr Bld 7.5 (H) 4.8 - 5.6 %    Comment: (NOTE) Pre diabetes:          5.7%-6.4% Diabetes:               >6.4% Glycemic control for   <7.0% adults with diabetes    Mean Plasma Glucose 168.55 mg/dL    Comment: Performed at [place] [address], [place], [place] [address]  Magnesium     Status: None   Collection Time: 04/08/18  2:16 AM  Result Value Ref Range   Magnesium 1.8 1.7 - 2.4 mg/dL    Comment: Performed at [facility], [place] [address], [place], [place] [address]  Phosphorus     Status: Abnormal   Collection Time: 04/08/18  2:16 AM  Result Value Ref Range   Phosphorus 2.2 (L) 2.5 - 4.6 mg/dL    Comment: Performed at [facility], [place] [address] [place], [place] [address]  TSH     Status: None   Collection Time: 04/08/18  2:16 AM  Result Value Ref Range   TSH 0.962 0.350 - 4.500 uIU/mL    Comment: Performed by a 3rd Generation assay with a functional sensitivity of <=0.01 uIU/mL. Performed at [facility], [place] [address], [place], [place] [address]   Comprehensive metabolic panel     Status: Abnormal   Collection Time: 04/08/18  2:16 AM  Result Value Ref Range   Sodium 136 135 - 145 mmol/L   Potassium 3.5 3.5 - 5.1 mmol/L    Comment: DELTA CHECK NOTED   Chloride 110 98 - 111 mmol/L   CO2 19 (L) 22 - 32 mmol/L   Glucose, Bld 212 (H) 70 - 99 mg/dL   BUN 13 6 - 20 mg/dL   Creatinine, Ser 0.85 0.44 - 1.00 mg/dL   Calcium 7.1 (L) 8.9 - 10.3 mg/dL   Total Protein 5.7 (L) 6.5 - 8.1 g/dL  Albumin 2.6 (L) 3.5 - 5.0 g/dL   AST 24 15 - 41 U/L   ALT 16 0 - 44 U/L   Alkaline Phosphatase 59 38 - 126 U/L   Total Bilirubin 3.3 (H) 0.3 - 1.2 mg/dL   GFR calc non Af Amer >60 >60 mL/min   GFR calc Af Amer >60 >60 mL/min   Anion gap 7 5 - 15    Comment: Performed at [facility], [place] [address], [place], [place] [address]  CBC     Status: Abnormal   Collection Time: 04/08/18  2:16 AM  Result Value Ref Range   WBC 9.1 4.0 - 10.5 K/uL   RBC 2.80 (L) 3.87 - 5.11 MIL/uL   Hemoglobin 7.6 (L) 12.0 -  15.0 g/dL    Comment: REPEATED TO VERIFY POST TRANSFUSION SPECIMEN DELTA CHECK NOTED    HCT 24.0 (L) 36.0 - 46.0 %   MCV 85.7 80.0 - 100.0 fL   MCH 27.1 26.0 - 34.0 pg   MCHC 31.7 30.0 - 36.0 g/dL   RDW 21.5 (H) 11.5 - 15.5 %   Platelets 107 (L) 150 - 400 K/uL    Comment: Immature Platelet Fraction may be clinically indicated, consider ordering this additional test [id number] CONSISTENT WITH PREVIOUS RESULT    nRBC 3.8 (H) 0.0 - 0.2 %    Comment: Performed at [facility], [place] [address], [place], [place] [address]  Transfusion reaction     Status: None (Preliminary result)   Collection Time: 04/08/18  2:16 AM  Result Value Ref Range   Post RXN DAT IgG NEG    DAT C3      NEG Performed at [facility], [place] [address], [place], [place] [address]    Path interp tx rxn PENDING   Glucose, capillary     Status: Abnormal   Collection Time: 04/08/18  3:17 AM  Result Value Ref Range   Glucose-Capillary 179 (H) 70 - 99 mg/dL  Blood gas, arterial     Status: Abnormal   Collection Time: 04/08/18  5:45 AM  Result Value Ref Range   FIO2 60.00    Delivery systems BILEVEL POSITIVE AIRWAY PRESSURE    Mode BILEVEL POSITIVE AIRWAY PRESSURE    Inspiratory PAP 14.0    Expiratory PAP 6.0    pH, Arterial 7.402 7.350 - 7.450   pCO2 arterial 31.1 (L) 32.0 - 48.0 mmHg   pO2, Arterial 95.1 83.0 - 108.0 mmHg   Bicarbonate 19.0 (L) 20.0 - 28.0 mmol/L   Acid-base deficit 4.4 (H) 0.0 - 2.0 mmol/L   O2 Saturation 97.5 %   Patient temperature 98.6    Collection site RIGHT RADIAL    Drawn by [id number]    Sample type ARTERIAL DRAW    Allens test (pass/fail) PASS PASS    Comment: Performed at [facility], [place] [address], [place],  [address]  Glucose, capillary     Status: Abnormal   Collection Time: 04/08/18  8:01 AM  Result Value Ref Range   Glucose-Capillary 186 (H) 70 - 99 mg/dL   Comment 1 Notify RN    Comment 2 Document in Chart     Dg Chest Port 1 View  Result Date: 04/07/2018 CLINICAL DATA:  Breast cancer with metastases.  Fever. EXAM: PORTABLE CHEST 1 VIEW COMPARISON:  CT scan March 11, 2018 FINDINGS: There is an oval hazy opacity in the right upper lobe which correlates with the site of a previously  identified nodule, likely a metastatic lesion. No other definitive pulmonary nodules. The heart, hila, and mediastinum are unremarkable. No pneumothorax. The right Port-A-Cath terminates in the SVC. No other acute abnormalities. IMPRESSION: 1. The ill-defined hazy rounded opacity in the right upper lobe correlates with a previously identified nodule thought to be a metastatic lesion. No other abnormalities identified on today's study. Electronically Signed   By: Dorise Bullion III M.D   On: 04/07/2018 16:54   Resolved Hospital Problem list     Assessment / Plan   Shock  -multifactorial in setting of anemia, possible infectious component, hypovolemia -LVEF 03/21/18 60-65% P: Resolved shock, off pressors  Follow GI output / may need to replace if high volume  SDU monitoring PRN tylenol for fever   Diarrhea  -r/o infectious etiology P: Supportive care Await viral testing   Tachypnea / Acute Respiratory Distress -in setting of fever  P: PRN bipap for increased WOB > pt feels better with mask in place Transition to [place] O2   Known RUL Opacity  -thought to be area of metastatic disease P: Monitor   Metastatic Breast Cancer P: Follow up with outpatient ONC Dr. Alvy Bimler following, appreciate input   Anemia -s/p 1 units PRBC 3/9 Pancytopenia  P: Trend CBC  Goal Hgb per ONC >8   Best practice:  Diet: clear liquid, per primary  Pain/Anxiety/Delirium protocol (if indicated):  VAP protocol (if indicated): n/a DVT prophylaxis: SCD  GI prophylaxis: n/a Glucose control: SSI Mobility: as tolerated  Code Status: FC Family Communication: Patient updated 3/10 at bedside  Disposition: SDU   Critical  care time: n/a     Noe Gens, NP-C [place] Pgr: [phone] or if no answer [phone] 04/08/2018, 10:44 AM

## 2018-04-08 NOTE — Telephone Encounter (Signed)
"  Tessie Eke sister Lucius Conn [phone]).  My mother and I are on our way to [name].  Have tried since yesterday with no direct communication from anyone.  Treniece is not responding well at all.  Received text message from her that "she's not doing well".  We need the doctor or her nurse to call us as soon as possible to know what's going on.  We will arrive within thirty minutes; about 11:00 am."

## 2018-04-08 NOTE — Progress Notes (Signed)
Initial Nutrition Assessment  DOCUMENTATION CODES:   Non-severe (moderate) malnutrition in context of chronic illness  INTERVENTION:  - Continue Boost Breeze TID, each supplement provides 250 kcal and 9 grams of protein. - Will order daily multivitamin with minerals. - Diet advancement as medically feasible. - Encourage PO intakes as respiratory status permits.    NUTRITION DIAGNOSIS:   Moderate Malnutrition related to chronic illness, cancer and cancer related treatments as evidenced by mild fat depletion, mild muscle depletion, percent weight loss.  GOAL:   Patient will meet greater than or equal to 90% of their needs  MONITOR:   PO intake, Supplement acceptance, Diet advancement, Weight trends, Labs  REASON FOR ASSESSMENT:   Malnutrition Screening Tool  ASSESSMENT:   58 y.o. 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 She presented to [facility] ED from her Oncologist's office d/t fever up to 101.4, persistent diarrhea, hypotension, electrolyte imbalance, and severe anemia. She had a root canal 3 weeks ago and was given Amoxicillin. In the ED blood pressures did not respond to aggressive IV fluid hydration and she was started on pressors.  BMI indicates overweight status. Patient on CLD but has intermittently been on BiPAP since admission. She is currently on BiPAP but able to say a few words. Sister and mom are at bedside. Patient states she has not had anything PO since admission and mom reports that the last PO intake she knows of was on 3/8 at lunch when patient ate a half of a chicken salad sandwich, a small bowl of soup, and a bowl of watermelon and cantaloupe.   Patient denies any abdominal pain/pressure now or PTA including times when she would eat, denies throat pain now or with swallowing PTA, reports some nausea now but no nausea PTA. She indicates that appetite has been decreased, but no further  details are able to be collected about this at this time; will ask about this at follow-up if patient off BiPAP at that time. Patient does indicate that she experiences taste alterations of items simply tasting different. Mom reports that patient has mentioned that tomato sauce/tomato soup/items made with tomatoes tastes sour.   Per chart review, current weight is 148 lb and weight on 03/11/18 was 155 lb. This indicates 7 lb weight loss (4.5% body weight) in the past 1 month; not significant for time frame. Weight on 01/01/18 was 167 lb. This indicates 19 lb weight loss (11% body weight) in the past 3 months; significant for time frame.   Per Brandi's note this AM: SIRS with diarrhea and plan to r/o infectious etiology, severe sepsis, septic shock--now resolved and off pressors, respiratory distress, anemia s/p PRBC, known RUL opacity which is thought to be area of metastatic disease.   Medications reviewed; sliding scale novolog, 15 units levemir/day, 250 mg KPhos neutral BID, 10 mEq IV KCl x4 runs 3/9, 20 mEq IV KPhos x1 run 3/10, 50 mEq sodium bicarb x1 dose 3/10. Labs reviewed; CBGs: 179 and 186 mg/dl today, Ca: 7.1 mg/dl, Phos: 2.2 mg/dl.       NUTRITION - FOCUSED PHYSICAL EXAM:    Most Recent Value  Orbital Region  No depletion  Upper Arm Region  Mild depletion  Thoracic and Lumbar Region  Unable to assess  Buccal Region  Unable to assess  Temple Region  Unable to assess  Clavicle Bone Region  Mild depletion  Clavicle and Acromion Bone Region  Mild depletion  Scapular  Bone Region  Unable to assess  Dorsal Hand  No depletion  Patellar Region  No depletion  Anterior Thigh Region  Unable to assess  Posterior Calf Region  Mild depletion  Edema (RD Assessment)  None  Hair  Reviewed  Eyes  Reviewed  Mouth  Unable to assess  Skin  Reviewed  Nails  Reviewed       Diet Order:   Diet Order            Diet clear liquid Room service appropriate? Yes; Fluid consistency: Thin  Diet  effective now              EDUCATION NEEDS:   Not appropriate for education at this time  Skin:  Skin Assessment: Reviewed RN Assessment  Last BM:  3/10  Height:   Ht Readings from Last 1 Encounters:  04/07/18 5\' 2"  (1.575 m)    Weight:   Wt Readings from Last 1 Encounters:  04/07/18 67.1 kg    Ideal Body Weight:  50 kg  BMI:  Body mass index is 27.07 kg/m.  Estimated Nutritional Needs:   Kcal:  7408-1448 kcal  Protein:  110-125 grams  Fluid:  >/= 2 L/day     Jarome Matin, MS, RD, LDN, [facility] Inpatient Clinical Dietitian Pager # [phone] After hours/weekend pager # [phone]

## 2018-04-09 ENCOUNTER — Inpatient Hospital Stay (HOSPITAL_COMMUNITY): Payer: BLUE CROSS/BLUE SHIELD

## 2018-04-09 ENCOUNTER — Other Ambulatory Visit: Payer: Self-pay | Admitting: Hematology and Oncology

## 2018-04-09 DIAGNOSIS — J9601 Acute respiratory failure with hypoxia: Secondary | ICD-10-CM

## 2018-04-09 DIAGNOSIS — E440 Moderate protein-calorie malnutrition: Secondary | ICD-10-CM

## 2018-04-09 DIAGNOSIS — R000 Tachycardia, unspecified: Secondary | ICD-10-CM

## 2018-04-09 LAB — BASIC METABOLIC PANEL
Anion gap: 9 (ref 5–15)
BUN: 11 mg/dL (ref 6–20)
CO2: 22 mmol/L (ref 22–32)
Calcium: 7.7 mg/dL — ABNORMAL LOW (ref 8.9–10.3)
Chloride: 110 mmol/L (ref 98–111)
Creatinine, Ser: 0.65 mg/dL (ref 0.44–1.00)
GFR calc Af Amer: >60 mL/min (ref >60)
GFR calc non Af Amer: >60 mL/min (ref >60)
GLUCOSE: 132 mg/dL — AB (ref 70–99)
Potassium: 3.7 mmol/L (ref 3.5–5.1)
Sodium: 141 mmol/L (ref 135–145)

## 2018-04-09 LAB — GLUCOSE, CAPILLARY
Glucose-Capillary: 102 mg/dL — ABNORMAL HIGH (ref 70–99)
Glucose-Capillary: 117 mg/dL — ABNORMAL HIGH (ref 70–99)
Glucose-Capillary: 64 mg/dL — ABNORMAL LOW (ref 70–99)
Glucose-Capillary: 82 mg/dL (ref 70–99)
Glucose-Capillary: 83 mg/dL (ref 70–99)
Glucose-Capillary: 86 mg/dL (ref 70–99)
Glucose-Capillary: 96 mg/dL (ref 70–99)

## 2018-04-09 LAB — PHOSPHORUS: Phosphorus: 2.7 mg/dL (ref 2.5–4.6)

## 2018-04-09 LAB — CBC WITH DIFFERENTIAL/PLATELET
BLASTS: 2 %
Band Neutrophils: 0 %
Basophils Absolute: 0 10*3/uL (ref 0.0–0.1)
Basophils Relative: 0 %
Eosinophils Absolute: 0 10*3/uL (ref 0.0–0.5)
Eosinophils Relative: 0 %
HCT: 26.4 % — ABNORMAL LOW (ref 36.0–46.0)
Hemoglobin: 8.3 g/dL — ABNORMAL LOW (ref 12.0–15.0)
LYMPHS PCT: 15 %
Lymphs Abs: 0.6 10*3/uL — ABNORMAL LOW (ref 0.7–4.0)
MCH: 26.7 pg (ref 26.0–34.0)
MCHC: 31.4 g/dL (ref 30.0–36.0)
MCV: 84.9 fL (ref 80.0–100.0)
MONOS PCT: 19 %
Metamyelocytes Relative: 0 %
Monocytes Absolute: 0.7 10*3/uL (ref 0.1–1.0)
Myelocytes: 0 %
Neutro Abs: 2.4 10*3/uL (ref 1.7–7.7)
Neutrophils Relative %: 64 %
Other: 0 %
Platelets: 66 10*3/uL — ABNORMAL LOW (ref 150–400)
Promyelocytes Relative: 0 %
RBC: 3.11 MIL/uL — ABNORMAL LOW (ref 3.87–5.11)
RDW: 21.7 % — ABNORMAL HIGH (ref 11.5–15.5)
WBC: 3.8 10*3/uL — ABNORMAL LOW (ref 4.0–10.5)
nRBC: 0 /100 WBC
nRBC: 2.7 % — ABNORMAL HIGH (ref 0.0–0.2)

## 2018-04-09 LAB — STREP PNEUMONIAE URINARY ANTIGEN: Strep Pneumo Urinary Antigen: NEGATIVE

## 2018-04-09 LAB — PATHOLOGIST SMEAR REVIEW

## 2018-04-09 LAB — PROCALCITONIN: Procalcitonin: 69.16 ng/mL

## 2018-04-09 LAB — HAPTOGLOBIN: Haptoglobin: 199 mg/dL (ref 33–346)

## 2018-04-09 MED ORDER — ACETAMINOPHEN 10 MG/ML IV SOLN
1000.0000 mg | Freq: Once | INTRAVENOUS | Status: AC
  Administered 2018-04-09: 1000 mg via INTRAVENOUS
  Filled 2018-04-09: qty 100

## 2018-04-09 MED ORDER — SODIUM CHLORIDE 0.9 % IV SOLN
1.0000 g | Freq: Three times a day (TID) | INTRAVENOUS | Status: DC
  Administered 2018-04-09 – 2018-04-11 (×5): 1 g via INTRAVENOUS
  Filled 2018-04-09 (×6): qty 1

## 2018-04-09 MED ORDER — DEXTROSE-NACL 5-0.9 % IV SOLN
INTRAVENOUS | Status: DC
  Administered 2018-04-09: 17:00:00 via INTRAVENOUS

## 2018-04-09 MED ORDER — FUROSEMIDE 10 MG/ML IJ SOLN
60.0000 mg | Freq: Once | INTRAMUSCULAR | Status: AC
  Administered 2018-04-09: 60 mg via INTRAVENOUS
  Filled 2018-04-09: qty 6

## 2018-04-09 MED ORDER — DEXTROSE 50 % IV SOLN
INTRAVENOUS | Status: AC
  Administered 2018-04-09: 12.5 g via INTRAVENOUS
  Filled 2018-04-09: qty 50

## 2018-04-09 MED ORDER — DEXTROSE 50 % IV SOLN
12.5000 g | INTRAVENOUS | Status: AC
  Administered 2018-04-09: 12.5 g via INTRAVENOUS

## 2018-04-09 MED ORDER — POTASSIUM PHOSPHATES 15 MMOLE/5ML IV SOLN: 20.0000 mmol | Freq: Once | INTRAVENOUS | Status: DC

## 2018-04-09 MED ORDER — POTASSIUM CHLORIDE CRYS ER 20 MEQ PO TBCR: 40.0000 meq | EXTENDED_RELEASE_TABLET | Freq: Once | ORAL | Status: DC

## 2018-04-09 MED ORDER — MAGNESIUM SULFATE 2 GM/50ML IV SOLN
2.0000 g | Freq: Once | INTRAVENOUS | Status: AC
  Administered 2018-04-09: 2 g via INTRAVENOUS
  Filled 2018-04-09: qty 50

## 2018-04-09 MED ORDER — CHLORHEXIDINE GLUCONATE CLOTH 2 % EX PADS
6.0000 | MEDICATED_PAD | Freq: Every day | CUTANEOUS | Status: DC
  Administered 2018-04-09 – 2018-04-15 (×7): 6 via TOPICAL

## 2018-04-09 MED ORDER — POTASSIUM CHLORIDE 10 MEQ/100ML IV SOLN: 10.0000 meq | INTRAVENOUS | Status: DC

## 2018-04-09 NOTE — Progress Notes (Signed)
INFECTIOUS DISEASE PROGRESS NOTE  ID: Stacy Shelton is a 58 y.o. female with  Active Problems:   Absolute anemia   Metastatic breast cancer ([place])   Diarrhea   Sepsis ([place])   Hypokalemia   Hypomagnesemia   Acute lower UTI   Hypophosphatemia   Septic shock (HCC)   Malnutrition of moderate degree   Acute respiratory failure with hypoxia (HCC)  Subjective: tachypneic on bipap.  Wants to talk to Dr Alvy Bimler prior to intubation.   Abtx:  Anti-infectives (From admission, onward)   Start     Dose/Rate Route Frequency Ordered Stop   04/08/18 2100  vancomycin (VANCOCIN) 1,250 mg in sodium chloride 0.9 % 250 mL IVPB     1,250 mg 166.7 mL/hr over 90 Minutes Intravenous Every 24 hours 04/07/18 2000     04/08/18 0200  metroNIDAZOLE (FLAGYL) IVPB 500 mg     500 mg 100 mL/hr over 60 Minutes Intravenous Every 8 hours 04/07/18 1933     04/08/18 0100  ceFEPIme (MAXIPIME) 2 g in sodium chloride 0.9 % 100 mL IVPB     2 g 200 mL/hr over 30 Minutes Intravenous Every 8 hours 04/07/18 1958     04/07/18 1715  vancomycin (VANCOCIN) 1,500 mg in sodium chloride 0.9 % 500 mL IVPB     1,500 mg 250 mL/hr over 120 Minutes Intravenous  Once 04/07/18 1636 04/07/18 2226   04/07/18 1630  ceFEPIme (MAXIPIME) 2 g in sodium chloride 0.9 % 100 mL IVPB     2 g 200 mL/hr over 30 Minutes Intravenous  Once 04/07/18 1626 04/07/18 1754   04/07/18 1630  metroNIDAZOLE (FLAGYL) IVPB 500 mg     500 mg 100 mL/hr over 60 Minutes Intravenous  Once 04/07/18 1626 04/07/18 1902   04/07/18 1630  vancomycin (VANCOCIN) IVPB 1000 mg/200 mL premix  Status:  Discontinued     1,000 mg 200 mL/hr over 60 Minutes Intravenous  Once 04/07/18 1626 04/07/18 1636      Medications:  Scheduled:  chlorhexidine  15 mL Mouth Rinse BID   Chlorhexidine Gluconate Cloth  6 each Topical Daily   diltiazem  10 mg Intravenous Once   feeding supplement  1 Container Oral TID BM   furosemide  60 mg Intravenous Once   insulin aspart  0-9  Units Subcutaneous Q4H   mouth rinse  15 mL Mouth Rinse q12n4p   phosphorus  250 mg Oral BID    Objective: Vital signs in last 24 hours: Temp:  [99.5 F (37.5 C)-101.2 F (38.4 C)] 101.2 F (38.4 C) (03/11 1800) Pulse Rate:  [98-140] 114 (03/11 2009) Resp:  [22-52] 28 (03/11 2009) BP: (95-172)/(45-64) 117/59 (03/11 2009) SpO2:  [89 %-100 %] 99 % (03/11 2009) FiO2 (%):  [40 %-50 %] 40 % (03/11 2009)   General appearance: alert, cooperative and moderate distress Resp: diminished breath sounds anterior - bilateral Cardio: tachycardia GI: normal findings: bowel sounds normal and soft, non-tender  Lab Results Recent Labs    04/08/18 1626 04/09/18 0422  WBC 3.7* 3.8*  HGB 8.1* 8.3*  HCT 26.3* 26.4*  NA 139 141  K 3.2* 3.7  CL 112* 110  CO2 19* 22  BUN 12 11  CREATININE 0.76 0.65   Liver Panel Recent Labs    04/08/18 0216 04/08/18 1626  PROT 5.7* 6.0*  ALBUMIN 2.6* 2.5*  AST 24 22  ALT 16 16  ALKPHOS 59 54  BILITOT 3.3* 4.2*   Sedimentation Rate No results for input(s): ESRSEDRATE  in the last 72 hours. C-Reactive Protein No results for input(s): CRP in the last 72 hours.  Microbiology: Recent Results (from the past 240 hour(s))  Urine Culture     Status: Abnormal (Preliminary result)   Collection Time: 04/07/18 11:15 AM  Result Value Ref Range Status   Specimen Description   Final    URINE, CLEAN CATCH Performed at [facility], [address], [place], [place] [address]    Special Requests   Final    NONE Performed at [facility], [place] [address] [place], [place] [address]    Culture >=100,000 COLONIES/mL KLEBSIELLA PNEUMONIAE (A)  Final   Report Status PENDING  Incomplete  Culture, Blood     Status: None (Preliminary result)   Collection Time: 04/07/18 11:27 AM  Result Value Ref Range Status   Specimen Description   Final    BLOOD RIGHT ARM Performed at [facility], [place] [address] [place], [place] [address]    Special Requests   Final    BOTTLES DRAWN AEROBIC AND ANAEROBIC Blood Culture results may not be optimal due to an excessive volume of blood received in culture bottles   Culture   Final    NO GROWTH 2 DAYS Performed at [facility], [place] [address] [place], [place] [address]    Report Status PENDING  Incomplete  Culture, Blood     Status: None (Preliminary result)   Collection Time: 04/07/18 12:15 PM  Result Value Ref Range Status   Specimen Description   Final    PORTA CATH Performed at [facility], [place] [address], [place], [place] [address]    Special Requests   Final    BOTTLES DRAWN AEROBIC AND ANAEROBIC Blood Culture adequate volume   Culture   Final    NO GROWTH 2 DAYS Performed at [facility], [place] [address], [place], [place] [address]    Report Status PENDING  Incomplete  MRSA PCR Screening     Status: None   Collection Time: 04/07/18  8:41 PM  Result Value Ref Range Status   MRSA by PCR NEGATIVE NEGATIVE Final    Comment:        The GeneXpert MRSA Assay (FDA approved for NASAL specimens only), is one component of a comprehensive MRSA colonization surveillance program. It is not intended to diagnose MRSA infection nor to guide or monitor treatment for MRSA infections. Performed at [facility], [place] [address], [place], [place] [address]   Gastrointestinal Panel by PCR , Stool     Status: None   Collection Time: 04/08/18  1:06 AM  Result Value Ref Range Status   Campylobacter species NOT DETECTED NOT DETECTED Final   Plesimonas shigelloides NOT DETECTED NOT DETECTED Final   Salmonella species NOT DETECTED NOT DETECTED Final   Yersinia enterocolitica NOT DETECTED NOT DETECTED Final   Vibrio species NOT DETECTED NOT DETECTED Final   Vibrio cholerae NOT DETECTED NOT DETECTED Final   Enteroaggregative E coli (EAEC) NOT DETECTED NOT DETECTED Final   Enteropathogenic  E coli (EPEC) NOT DETECTED NOT DETECTED Final   Enterotoxigenic E coli (ETEC) NOT DETECTED NOT DETECTED Final   Shiga like toxin producing E coli (STEC) NOT DETECTED NOT DETECTED Final   Shigella/Enteroinvasive E coli (EIEC) NOT DETECTED NOT DETECTED Final   Cryptosporidium NOT DETECTED NOT DETECTED Final   Cyclospora cayetanensis NOT DETECTED NOT DETECTED Final   Entamoeba histolytica  NOT DETECTED NOT DETECTED Final   Giardia lamblia NOT DETECTED NOT DETECTED Final   Adenovirus F40/41 NOT DETECTED NOT DETECTED Final   Astrovirus NOT DETECTED NOT DETECTED Final   Norovirus GI/GII NOT DETECTED NOT DETECTED Final   Rotavirus A NOT DETECTED NOT DETECTED Final   Sapovirus (I, II, IV, and V) NOT DETECTED NOT DETECTED Final    Comment: Performed at [facility], [place] [place], [place] [address]  Respiratory Panel by PCR     Status: None   Collection Time: 04/08/18  1:06 AM  Result Value Ref Range Status   Adenovirus NOT DETECTED NOT DETECTED Final   Coronavirus 229E NOT DETECTED NOT DETECTED Final    Comment: (NOTE) The Coronavirus on the Respiratory Panel, DOES NOT test for the novel  Coronavirus (2019 nCoV)    Coronavirus HKU1 NOT DETECTED NOT DETECTED Final   Coronavirus NL63 NOT DETECTED NOT DETECTED Final   Coronavirus OC43 NOT DETECTED NOT DETECTED Final   Metapneumovirus NOT DETECTED NOT DETECTED Final   Rhinovirus / Enterovirus NOT DETECTED NOT DETECTED Final   Influenza A NOT DETECTED NOT DETECTED Final   Influenza B NOT DETECTED NOT DETECTED Final   Parainfluenza Virus 1 NOT DETECTED NOT DETECTED Final   Parainfluenza Virus 2 NOT DETECTED NOT DETECTED Final   Parainfluenza Virus 3 NOT DETECTED NOT DETECTED Final   Parainfluenza Virus 4 NOT DETECTED NOT DETECTED Final   Respiratory Syncytial Virus NOT DETECTED NOT DETECTED Final   Bordetella pertussis NOT DETECTED NOT DETECTED Final   Chlamydophila pneumoniae NOT DETECTED NOT DETECTED Final   Mycoplasma  pneumoniae NOT DETECTED NOT DETECTED Final    Comment: Performed at [facility], [address] [place], [place] [address]  C difficile quick scan w PCR reflex     Status: None   Collection Time: 04/08/18  2:30 PM  Result Value Ref Range Status   C Diff antigen NEGATIVE NEGATIVE Final   C Diff toxin NEGATIVE NEGATIVE Final   C Diff interpretation No C. difficile detected.  Final    Comment: Performed at [facility], [place] [address] [place], [place] [address]    Studies/Results: Ct Head Wo Contrast  Result Date: 04/08/2018 CLINICAL DATA:  Sepsis with known metastatic breast cancer EXAM: CT HEAD WITHOUT CONTRAST TECHNIQUE: Contiguous axial images were obtained from the base of the skull through the vertex without intravenous contrast. COMPARISON:  Brain MRI 03/16/2018 FINDINGS: Brain: There is focal hypoattenuation in the right occipital lobe, corresponding to the site of the known metastatic lesion. No acute hemorrhage or other extra-axial collection. No midline shift or significant mass effect. Normal size of the ventricles. Vascular: No abnormal hyperdensity of the major intracranial arteries or dural venous sinuses. No intracranial atherosclerosis. Skull: The visualized skull base, calvarium and extracranial soft tissues are normal. Sinuses/Orbits: Postsurgical changes of the maxillary and ethmoid sinuses. No mastoid or middle ear effusion. The orbits are normal. IMPRESSION: 1. Hypoattenuation in the right occipital lobe, corresponding to known metastatic lesion. 2. No acute hemorrhage. Electronically Signed   By: Ulyses Jarred M.D.   On: 04/08/2018 19:18   Ct Chest W Contrast  Result Date: 04/08/2018 CLINICAL DATA:  58 year old female with metastatic breast cancer, fever, sepsis. EXAM: CT CHEST, ABDOMEN, AND PELVIS WITH CONTRAST TECHNIQUE: Multidetector CT imaging of the chest, abdomen and pelvis was performed following the standard protocol during bolus  administration of intravenous contrast. CONTRAST:  132mL OMNIPAQUE IOHEXOL 300 MG/ML  SOLN COMPARISON:  Portable chest 04/07/2018. CT Chest, Abdomen, and Pelvis 03/11/2018. FINDINGS: CT CHEST FINDINGS Cardiovascular: No pericardial effusion. Stable cardiac size. Negative thoracic aorta. Central pulmonary arteries are patent. Right IJ chest porta cath in place. Mediastinum/Nodes: Small but progressed since February right paratracheal lymph nodes individually 9 millimeter short axis. No other mediastinal lymphadenopathy; postinflammatory coarsely calcified subcarinal and right hilar nodes are stable. Lungs/Pleura: New subtotal consolidation of the right upper and lower lobes with superimposed small layering right pleural effusion. This obscures some of the right lung nodule/metastases seen in February. Patchy discontinuous peribronchial opacity with some consolidation in the right middle lobe is new. Major airways are patent. New consolidation in the medial basal segment of the left lower lobe with additional new indistinct peribronchial ground-glass and nodules in the other left lower lobe segments. Similar nodularity in the lingula with some early consolidation. In the left upper lobe some of the formally circumscribed metastatic appearing nodules now are indistinct with marginal ground-glass opacity (series 4, image 47). This is also true of the 1 or 2 visible right lung nodules. Musculoskeletal: No acute or destructive osseous lesion identified. New subcutaneous edema along the left lateral chest wall. Left axillary lymphadenopathy has mildly regressed. CT ABDOMEN PELVIS FINDINGS Hepatobiliary: Mild hepatomegaly. Trace perihepatic free fluid. Multiple hypodense liver metastases redemonstrated. The largest have not significantly changed in size or configuration. No definite new hepatic metastasis. Gallbladder remains within normal limits. Pancreas: Negative. Spleen: Stable mild splenic enlargement and small  calcified granulomas. Adrenals/Urinary Tract: Negative adrenal glands. Bilateral renal enhancement and contrast excretion is symmetric and within normal limits. Benign left renal parapelvic cysts. Decompressed proximal ureters. Unremarkable urinary bladder. Stomach/Bowel: Sigmoid diverticulosis with no active inflammation. Mild motion artifact in the upper abdomen. No dilated or definitely inflamed large or small bowel loops. Decompressed stomach and duodenum. No free air. Trace perihepatic free fluid. Vascular/Lymphatic: Mild Aortoiliac calcified atherosclerosis. Major arterial structures are patent. Portal venous system seems patent. No lymphadenopathy. Reproductive: Stable and within normal limits. Other: No pelvic free fluid. Musculoskeletal: No acute or destructive osseous lesion identified. Mild bilateral subcutaneous flank edema. IMPRESSION: 1. Severe right lung multilobar pneumonia. Multifocal early pneumonia in the left lung. Associated small layering right pleural effusion. 2. Evolution of the pulmonary nodules/presumed pulmonary metastases visible today, with irregular and indistinct margins. This might reflect treatment affect. 3. Mildly enlarged right paratracheal mediastinal nodes, favor reactive rather than metastatic. 4. Stable liver metastases. Mild hepatomegaly and trace perihepatic free fluid. 5. No new metastatic disease identified. Electronically Signed   By: Genevie Ann M.D.   On: 04/08/2018 19:32   Ct Abdomen Pelvis W Contrast  Result Date: 04/08/2018 CLINICAL DATA:  57 year old female with metastatic breast cancer, fever, sepsis. EXAM: CT CHEST, ABDOMEN, AND PELVIS WITH CONTRAST TECHNIQUE: Multidetector CT imaging of the chest, abdomen and pelvis was performed following the standard protocol during bolus administration of intravenous contrast. CONTRAST:  151mL OMNIPAQUE IOHEXOL 300 MG/ML  SOLN COMPARISON:  Portable chest 04/07/2018. CT Chest, Abdomen, and Pelvis 03/11/2018. FINDINGS: CT CHEST  FINDINGS Cardiovascular: No pericardial effusion. Stable cardiac size. Negative thoracic aorta. Central pulmonary arteries are patent. Right IJ chest porta cath in place. Mediastinum/Nodes: Small but progressed since February right paratracheal lymph nodes individually 9 millimeter short axis. No other mediastinal lymphadenopathy; postinflammatory coarsely calcified subcarinal and right hilar nodes are stable. Lungs/Pleura: New subtotal consolidation of the right upper and lower lobes with superimposed small layering right pleural effusion. This obscures some of the right lung nodule/metastases seen in February. Patchy  discontinuous peribronchial opacity with some consolidation in the right middle lobe is new. Major airways are patent. New consolidation in the medial basal segment of the left lower lobe with additional new indistinct peribronchial ground-glass and nodules in the other left lower lobe segments. Similar nodularity in the lingula with some early consolidation. In the left upper lobe some of the formally circumscribed metastatic appearing nodules now are indistinct with marginal ground-glass opacity (series 4, image 47). This is also true of the 1 or 2 visible right lung nodules. Musculoskeletal: No acute or destructive osseous lesion identified. New subcutaneous edema along the left lateral chest wall. Left axillary lymphadenopathy has mildly regressed. CT ABDOMEN PELVIS FINDINGS Hepatobiliary: Mild hepatomegaly. Trace perihepatic free fluid. Multiple hypodense liver metastases redemonstrated. The largest have not significantly changed in size or configuration. No definite new hepatic metastasis. Gallbladder remains within normal limits. Pancreas: Negative. Spleen: Stable mild splenic enlargement and small calcified granulomas. Adrenals/Urinary Tract: Negative adrenal glands. Bilateral renal enhancement and contrast excretion is symmetric and within normal limits. Benign left renal parapelvic cysts.  Decompressed proximal ureters. Unremarkable urinary bladder. Stomach/Bowel: Sigmoid diverticulosis with no active inflammation. Mild motion artifact in the upper abdomen. No dilated or definitely inflamed large or small bowel loops. Decompressed stomach and duodenum. No free air. Trace perihepatic free fluid. Vascular/Lymphatic: Mild Aortoiliac calcified atherosclerosis. Major arterial structures are patent. Portal venous system seems patent. No lymphadenopathy. Reproductive: Stable and within normal limits. Other: No pelvic free fluid. Musculoskeletal: No acute or destructive osseous lesion identified. Mild bilateral subcutaneous flank edema. IMPRESSION: 1. Severe right lung multilobar pneumonia. Multifocal early pneumonia in the left lung. Associated small layering right pleural effusion. 2. Evolution of the pulmonary nodules/presumed pulmonary metastases visible today, with irregular and indistinct margins. This might reflect treatment affect. 3. Mildly enlarged right paratracheal mediastinal nodes, favor reactive rather than metastatic. 4. Stable liver metastases. Mild hepatomegaly and trace perihepatic free fluid. 5. No new metastatic disease identified. Electronically Signed   By: Genevie Ann M.D.   On: 04/08/2018 19:32     Assessment/Plan: Fever, septic shock Metastatic breast cancer  Diarrhea Uncontrolled DM (A1C 12.9)  Total days of antibiotics: 2  vanco/cefepime/flagyl  UCx > 100k Kleb pneumo Will change cefepime to merrem Stop flagyl- C diff (-).  Pt would like to talk to Dr Alvy Bimler, I let her know that I don't believe she is on call.  She would like to "schedule" intubation tomorrow.  I asked her to think about this as it is likely she will decompensate emergently and have a worse outcome than if she is electively intubated.          Bobby Rumpf MD, FACP Infectious Diseases (pager) [phone] [email or web address] 04/09/2018, 8:28 PM  LOS: 1 day

## 2018-04-09 NOTE — Progress Notes (Signed)
NAME:  Stacy Shelton, MRN:  [id number], DOB:  1960-06-30, LOS: 1 ADMISSION DATE:  04/07/2018, CONSULTATION DATE:  04/07/18 REFERRING MD:  Roel Cluck, CHIEF COMPLAINT: hypotension  Brief History   58 yo WF widely metastatic breast cancer (to bone, liver) on chemo (last treatment 03/26/18) admitted 3/9 fever and acute onset of explosive diarrhea for the past 24-48h.  Hypotesnion on admit with Hgb 5.7, initially required vasopressors but weaned off after transfusion. Required O2 for saturations into the 80's.  Required intermittent BiPAP  Past Medical History  Metastatic Breast [facility]   3/09  Admit  3/10  Off pressors, intermittent bipap.  Febrile. Ongoing diarrhea.   Consults:  PCCM   Procedures:    Significant Diagnostic Tests:  CT Head / Chest / ABD / Pelvis 3/10 >> severe right lung multilobar pneumonia, multifocal early PNA in the left lung, small layering right effusion, no new metastatic disease, stable liver mets, evolution of pulmonary nodules / presumed pulmonary metastases visible with irregular / indistinct margins  Micro Data:  BCx2 3/9 (cancer center) >>  GI PCR 3/10 >> negative  C-Diff 3/10 >> negative  RVP 3/10 >> negative   Antimicrobials:  Vanco 3/9 >>  Cefepime 3/9 >>  Flagyl 3/9 >>   Interim history/subjective:  RN reports pt has worn bipap most of the night and during day today. She is requiring 50% FiO2 or 100%NRB when off bipap. Tachypnea into the 40's without overt distress.  Pt concerned about not being able to get her chemo while she is ill.  Tmax 101.4 / overall improved fever curve. Diarrhea has slowed.   Objective    Blood pressure (!) 109/46, pulse (!) 109, temperature (!) 101.4 F (38.6 C), resp. rate (!) 29, height 5\' 2"  (1.575 m), weight 67.1 kg, last menstrual period 10/30/2010, SpO2 97 %.  Exam: General:  Adult female lying in bed with NRB in place, family at bedside HEENT: MM pink/moist, NRB mask in place Neuro:  AAOx4, speech clear, MAE CV: s1s2 rrr, ST on monitor, no m/r/g PULM: tachypnea into the 30/40's, abd accessory muscle use but no over distress, strong cough / able to clear secretions, good voice quality NW:GNFA, non-tender, bsx4 active  Extremities: warm/dry, no edema  Skin: no rashes or lesions  Labs    CBC: Recent Labs  Lab 04/07/18 1141 04/07/18 2042 04/08/18 0216 04/08/18 1626 04/09/18 0422  WBC 11.1* 7.5 9.1 3.7* 3.8*  NEUTROABS 4.2 3.6  --  1.5* 2.4  HGB 7.2* 5.7* 7.6* 8.1* 8.3*  HCT 22.9* 18.4* 24.0* 26.3* 26.4*  MCV 83.3 86.0 85.7 87.1 84.9  PLT 151 109* 107* 74* 66*    Basic Metabolic Panel: Recent Labs  Lab 04/07/18 1143 04/07/18 2042 04/08/18 0216 04/08/18 1626 04/09/18 0422 04/09/18 1102  NA 132* 135   133* 136 139 141  --   K 2.7* 3.0*   2.9* 3.5 3.2* 3.7  --   CL 95* 106   106 110 112* 110  --   CO2 22 17*   18* 19* 19* 22  --   GLUCOSE 223* 262*   258* 212* 161* 132*  --   BUN 10 12   11 13 12 11   --   CREATININE 0.78 0.83   0.82 0.85 0.76 0.65  --   CALCIUM 8.7* 7.5*   7.3* 7.1* 7.4* 7.7*  --   MG 1.3* 1.9 1.8  --   --   --   PHOS  --  2.0* 2.2*  --   --  2.7   GFR: Estimated Creatinine Clearance: 68.9 mL/min (by C-G formula based on SCr of 0.65 mg/dL). Recent Labs  Lab 04/07/18 1626 04/07/18 2042 04/08/18 0216 04/08/18 1626 04/09/18 0422  PROCALCITON  --  14.25  --   --   --   WBC  --  7.5 9.1 3.7* 3.8*  LATICACIDVEN 1.9 1.0  --   --   --     Liver Function Tests: Recent Labs  Lab 04/07/18 1143 04/07/18 2042 04/08/18 0216 04/08/18 1626  AST 15 11* 24 22  ALT 13 12 16 16   ALKPHOS 92 64 59 54  BILITOT 1.5* 1.8* 3.3* 4.2*  PROT 7.4 5.7* 5.7* 6.0*  ALBUMIN 3.7 2.6* 2.6* 2.5*   No results for input(s): LIPASE, AMYLASE in the last 168 hours. No results for input(s): AMMONIA in the last 168 hours.  ABG    Component Value Date/Time   PHART 7.321 (L) 04/08/2018 1547   PCO2ART 37.5 04/08/2018 1547   PO2ART 121 (H) 04/08/2018  1547   HCO3 18.2 (L) 04/08/2018 1547   ACIDBASEDEF 6.1 (H) 04/08/2018 1547   O2SAT 98.0 04/08/2018 1547     Coagulation Profile: Recent Labs  Lab 04/07/18 2042  INR 1.6*    Cardiac Enzymes: No results for input(s): CKTOTAL, CKMB, CKMBINDEX, TROPONINI in the last 168 hours.  HbA1C: Hgb A1c MFr Bld  Date/Time Value Ref Range Status  04/08/2018 02:16 AM 7.5 (H) 4.8 - 5.6 % Final    Comment:    (NOTE) Pre diabetes:          5.7%-6.4% Diabetes:              >6.4% Glycemic control for   <7.0% adults with diabetes   03/10/2018 11:50 PM 12.9 (H) 4.8 - 5.6 % Final    Comment:    (NOTE) Pre diabetes:          5.7%-6.4% Diabetes:              >6.4% Glycemic control for   <7.0% adults with diabetes     CBG: Recent Labs  Lab 04/08/18 1932 04/08/18 2337 04/09/18 0353 04/09/18 0812 04/09/18 1136  GLUCAP 134* 120* 117* 96 83    Ct Head Wo Contrast  Result Date: 04/08/2018 CLINICAL DATA:  Sepsis with known metastatic breast cancer EXAM: CT HEAD WITHOUT CONTRAST TECHNIQUE: Contiguous axial images were obtained from the base of the skull through the vertex without intravenous contrast. COMPARISON:  Brain MRI 03/16/2018 FINDINGS: Brain: There is focal hypoattenuation in the right occipital lobe, corresponding to the site of the known metastatic lesion. No acute hemorrhage or other extra-axial collection. No midline shift or significant mass effect. Normal size of the ventricles. Vascular: No abnormal hyperdensity of the major intracranial arteries or dural venous sinuses. No intracranial atherosclerosis. Skull: The visualized skull base, calvarium and extracranial soft tissues are normal. Sinuses/Orbits: Postsurgical changes of the maxillary and ethmoid sinuses. No mastoid or middle ear effusion. The orbits are normal. IMPRESSION: 1. Hypoattenuation in the right occipital lobe, corresponding to known metastatic lesion. 2. No acute hemorrhage. Electronically Signed   By: Ulyses Jarred  M.D.   On: 04/08/2018 19:18   Ct Chest W Contrast  Result Date: 04/08/2018 CLINICAL DATA:  58 year old female with metastatic breast cancer, fever, sepsis. EXAM: CT CHEST, ABDOMEN, AND PELVIS WITH CONTRAST TECHNIQUE: Multidetector CT imaging of the chest, abdomen and pelvis was performed following the standard protocol during bolus administration of  intravenous contrast. CONTRAST:  158mL OMNIPAQUE IOHEXOL 300 MG/ML  SOLN COMPARISON:  Portable chest 04/07/2018. CT Chest, Abdomen, and Pelvis 03/11/2018. FINDINGS: CT CHEST FINDINGS Cardiovascular: No pericardial effusion. Stable cardiac size. Negative thoracic aorta. Central pulmonary arteries are patent. Right IJ chest porta cath in place. Mediastinum/Nodes: Small but progressed since February right paratracheal lymph nodes individually 9 millimeter short axis. No other mediastinal lymphadenopathy; postinflammatory coarsely calcified subcarinal and right hilar nodes are stable. Lungs/Pleura: New subtotal consolidation of the right upper and lower lobes with superimposed small layering right pleural effusion. This obscures some of the right lung nodule/metastases seen in February. Patchy discontinuous peribronchial opacity with some consolidation in the right middle lobe is new. Major airways are patent. New consolidation in the medial basal segment of the left lower lobe with additional new indistinct peribronchial ground-glass and nodules in the other left lower lobe segments. Similar nodularity in the lingula with some early consolidation. In the left upper lobe some of the formally circumscribed metastatic appearing nodules now are indistinct with marginal ground-glass opacity (series 4, image 47). This is also true of the 1 or 2 visible right lung nodules. Musculoskeletal: No acute or destructive osseous lesion identified. New subcutaneous edema along the left lateral chest wall. Left axillary lymphadenopathy has mildly regressed. CT ABDOMEN PELVIS FINDINGS  Hepatobiliary: Mild hepatomegaly. Trace perihepatic free fluid. Multiple hypodense liver metastases redemonstrated. The largest have not significantly changed in size or configuration. No definite new hepatic metastasis. Gallbladder remains within normal limits. Pancreas: Negative. Spleen: Stable mild splenic enlargement and small calcified granulomas. Adrenals/Urinary Tract: Negative adrenal glands. Bilateral renal enhancement and contrast excretion is symmetric and within normal limits. Benign left renal parapelvic cysts. Decompressed proximal ureters. Unremarkable urinary bladder. Stomach/Bowel: Sigmoid diverticulosis with no active inflammation. Mild motion artifact in the upper abdomen. No dilated or definitely inflamed large or small bowel loops. Decompressed stomach and duodenum. No free air. Trace perihepatic free fluid. Vascular/Lymphatic: Mild Aortoiliac calcified atherosclerosis. Major arterial structures are patent. Portal venous system seems patent. No lymphadenopathy. Reproductive: Stable and within normal limits. Other: No pelvic free fluid. Musculoskeletal: No acute or destructive osseous lesion identified. Mild bilateral subcutaneous flank edema. IMPRESSION: 1. Severe right lung multilobar pneumonia. Multifocal early pneumonia in the left lung. Associated small layering right pleural effusion. 2. Evolution of the pulmonary nodules/presumed pulmonary metastases visible today, with irregular and indistinct margins. This might reflect treatment affect. 3. Mildly enlarged right paratracheal mediastinal nodes, favor reactive rather than metastatic. 4. Stable liver metastases. Mild hepatomegaly and trace perihepatic free fluid. 5. No new metastatic disease identified. Electronically Signed   By: Genevie Ann M.D.   On: 04/08/2018 19:32   Ct Abdomen Pelvis W Contrast  Result Date: 04/08/2018 CLINICAL DATA:  58 year old female with metastatic breast cancer, fever, sepsis. EXAM: CT CHEST, ABDOMEN, AND PELVIS  WITH CONTRAST TECHNIQUE: Multidetector CT imaging of the chest, abdomen and pelvis was performed following the standard protocol during bolus administration of intravenous contrast. CONTRAST:  150mL OMNIPAQUE IOHEXOL 300 MG/ML  SOLN COMPARISON:  Portable chest 04/07/2018. CT Chest, Abdomen, and Pelvis 03/11/2018. FINDINGS: CT CHEST FINDINGS Cardiovascular: No pericardial effusion. Stable cardiac size. Negative thoracic aorta. Central pulmonary arteries are patent. Right IJ chest porta cath in place. Mediastinum/Nodes: Small but progressed since February right paratracheal lymph nodes individually 9 millimeter short axis. No other mediastinal lymphadenopathy; postinflammatory coarsely calcified subcarinal and right hilar nodes are stable. Lungs/Pleura: New subtotal consolidation of the right upper and lower lobes with superimposed small layering right pleural  effusion. This obscures some of the right lung nodule/metastases seen in February. Patchy discontinuous peribronchial opacity with some consolidation in the right middle lobe is new. Major airways are patent. New consolidation in the medial basal segment of the left lower lobe with additional new indistinct peribronchial ground-glass and nodules in the other left lower lobe segments. Similar nodularity in the lingula with some early consolidation. In the left upper lobe some of the formally circumscribed metastatic appearing nodules now are indistinct with marginal ground-glass opacity (series 4, image 47). This is also true of the 1 or 2 visible right lung nodules. Musculoskeletal: No acute or destructive osseous lesion identified. New subcutaneous edema along the left lateral chest wall. Left axillary lymphadenopathy has mildly regressed. CT ABDOMEN PELVIS FINDINGS Hepatobiliary: Mild hepatomegaly. Trace perihepatic free fluid. Multiple hypodense liver metastases redemonstrated. The largest have not significantly changed in size or configuration. No definite  new hepatic metastasis. Gallbladder remains within normal limits. Pancreas: Negative. Spleen: Stable mild splenic enlargement and small calcified granulomas. Adrenals/Urinary Tract: Negative adrenal glands. Bilateral renal enhancement and contrast excretion is symmetric and within normal limits. Benign left renal parapelvic cysts. Decompressed proximal ureters. Unremarkable urinary bladder. Stomach/Bowel: Sigmoid diverticulosis with no active inflammation. Mild motion artifact in the upper abdomen. No dilated or definitely inflamed large or small bowel loops. Decompressed stomach and duodenum. No free air. Trace perihepatic free fluid. Vascular/Lymphatic: Mild Aortoiliac calcified atherosclerosis. Major arterial structures are patent. Portal venous system seems patent. No lymphadenopathy. Reproductive: Stable and within normal limits. Other: No pelvic free fluid. Musculoskeletal: No acute or destructive osseous lesion identified. Mild bilateral subcutaneous flank edema. IMPRESSION: 1. Severe right lung multilobar pneumonia. Multifocal early pneumonia in the left lung. Associated small layering right pleural effusion. 2. Evolution of the pulmonary nodules/presumed pulmonary metastases visible today, with irregular and indistinct margins. This might reflect treatment affect. 3. Mildly enlarged right paratracheal mediastinal nodes, favor reactive rather than metastatic. 4. Stable liver metastases. Mild hepatomegaly and trace perihepatic free fluid. 5. No new metastatic disease identified. Electronically Signed   By: Genevie Ann M.D.   On: 04/08/2018 19:32   Dg Chest Port 1 View  Result Date: 04/07/2018 CLINICAL DATA:  Breast cancer with metastases.  Fever. EXAM: PORTABLE CHEST 1 VIEW COMPARISON:  CT scan March 11, 2018 FINDINGS: There is an oval hazy opacity in the right upper lobe which correlates with the site of a previously identified nodule, likely a metastatic lesion. No other definitive pulmonary nodules. The  heart, hila, and mediastinum are unremarkable. No pneumothorax. The right Port-A-Cath terminates in the SVC. No other acute abnormalities. IMPRESSION: 1. The ill-defined hazy rounded opacity in the right upper lobe correlates with a previously identified nodule thought to be a metastatic lesion. No other abnormalities identified on today's study. Electronically Signed   By: Dorise Bullion III M.D   On: 04/07/2018 16:54   Resolved Hospital Problem list     Assessment / Plan   Shock - resolved -multifactorial in setting of anemia, dense RLL PNA, hypovolemia -LVEF 03/21/18 60-65% P: SDU monitoring  PRN tylenol for fever  Follow cultures  Diarrhea  -viral panel negative P: Supportive care Monitor volume diarrhea, may need replacement   RLL PNA  Tachypnea / Acute Hypoxemic Respiratory Failure P: Reviewed possibility of intubation with family / patient Continue empiric abx as above  Continue PRN BiPAP for increased work of breathing > my concern is that this will not be a quick turn around for her given dense consolidation of  RLL and baseline chronic illness.  She is hesitant to be intubated but does not state she doesn't want it. Hopefully, this would be a reversible process. Will need to hear from Dr. Alvy Bimler about her ONC prognosis > IE: should we intubate? Wean O2 for sats >90% Pulmonary hygiene-IS, mobilize  Known RUL Opacity / Nodule -thought to be area of metastatic disease P: Monitor   Metastatic Breast Cancer P: Dr. Alvy Bimler following, appreciate input   Anemia -s/p 1 units PRBC 3/9 Pancytopenia  P: Trend CVP Transfuse per ICU guidelines    Best practice:  Diet: clear liquid, per primary  Pain/Anxiety/Delirium protocol (if indicated):  VAP protocol (if indicated): n/a DVT prophylaxis: SCD  GI prophylaxis: n/a Glucose control: SSI Mobility: as tolerated  Code Status: FC.  See above discussion.  Family Communication: Patient, mother and best friend updated at  bedside 3/11.   Disposition: SDU   Critical care time: 30 minutes     Noe Gens, NP-C [facility] Pgr: [phone] or if no answer [phone] 04/09/2018, 12:19 PM

## 2018-04-09 NOTE — Progress Notes (Signed)
Pharmacy Antibiotic Note  Stacy Shelton is a 58 y.o. female admitted on 04/07/2018 with sepsis and fever with unknown source.  PMH significant for metastatic breast cancer (currently undergoing chemotherapy). Noted UA with evidence of UTI.  Upon admission, Pharmacy has been consulted for Vancomycin and Cefepime dosing.  Assessment:  ID following. Pt currently on day #2 full dose IV antibiotics with vancomycin, cefepime, and metronidazole. Cefepime and metronidazole have been discontinued; pharmacy consulted to dose meropenem for UTI.   Today, 04/09/18  WBC 3.8, ANC 2.4  PCT 69  SCr 0.65, CrCl ~69 mL/min  Plan:  Continue vancomycin 1250 mg IV Q 24 hrs. Goal AUC 400-550.   Start meropenem 1 g IV q8h  Follow renal function and culture data  Monitor vancomycin levels as needed   Height: 5\' 2"  (157.5 cm) Weight: 148 lb (67.1 kg) IBW/kg (Calculated) : 50.1  Temp (24hrs), Avg:100.6 F (38.1 C), Min:99.5 F (37.5 C), Max:101.2 F (38.4 C)  Recent Labs  Lab 04/07/18 1141 04/07/18 1143 04/07/18 1626 04/07/18 2042 04/08/18 0216 04/08/18 1626 04/09/18 0422  WBC 11.1*  --   --  7.5 9.1 3.7* 3.8*  CREATININE  --  0.78  --  0.83  0.82 0.85 0.76 0.65  LATICACIDVEN  --   --  1.9 1.0  --   --   --     Estimated Creatinine Clearance: 68.9 mL/min (by C-G formula based on SCr of 0.65 mg/dL).    Allergies  Allergen Reactions  . Nsaids Anaphylaxis    Antimicrobials this admission: 3/9 Levaquin x 1 at [facility] 3/9 Cefepime >>  3/11 3/9 Vanc >>  3/9 Flagyl >> 3/11 3/11 meropenem >>  Dose adjustments this admission:  Microbiology results: 3/10 C.diff: Negative 3/10 GI panel: negative 3/10: Respiratory panel: Negative 3/9: MRSA PCR: Negative  Thank you for allowing pharmacy to be a part of this patient's care.  Lenis Noon, PharmD 04/09/2018 8:53 PM

## 2018-04-09 NOTE — Progress Notes (Signed)
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  She had a root canal 3 wks ago and was given Amoxicillin.  TRH asked to admit.    In the ED blood pressures did not respond to aggressive IV fluid hydration and was started on pressors.  Blood cultures were drawn and she was started on broad-spectrum IV antibiotics.  Transfered to stepdown unit for pressor support  Assessment & Plan:   Active Problems:   Absolute anemia   Metastatic breast cancer (HCC)   Diarrhea   Severe sepsis (HCC)   Hypokalemia   Hypomagnesemia   Acute lower UTI   Hypophosphatemia   Septic shock (HCC)   #1 sepsis/ septic shock resolved patient off of pressors 04/08/2018.  CT scan of the chest done yesterday shows findings consistent with pneumonia.  Patient is on triple antibiotics Vanco cefepime and Flagyl.  Her T-max was 101.  This morning her temperature is 100.9.  Follow-up CMP tomorrow I do not have LFTs from today.  C. difficile negative.  #2 respiratory distress patient remains on BiPAP.  Chest CT shows bilateral pneumonia.  Continue triple antibiotics.  #3 pancytopenia probably secondary to recent chemo versus sepsis.  Hemoglobin this morning is 8.3.  Will transfuse to keep hemoglobin above 8.  #4 diastolic CHF stable  #5 type 2 diabetes uncontrolled hemoglobin A1c 12.9.  Continue Levemir insulin 15 units daily.  Her blood glucose has been stable here.  #6 metastatic breast cancer with mets to liver brain and bone.s/post recent chemo.  #7 sinus tachycardia secondary to #1 #2 patient started on Cardizem last night.   Nutrition Problem: Moderate Malnutrition Etiology:  chronic illness, cancer and cancer related treatments     Signs/Symptoms: mild fat depletion, mild muscle depletion, percent weight loss Percent weight loss: 11 %    Interventions: Boost Breeze, MVI  Estimated body mass index is 27.07 kg/m as calculated from the following:   Height as of this encounter: 5\' 2"  (1.575 m).   Weight as of this encounter: 67.1 kg.  DVT prophylaxis: SCD Code Status: Full code  family Communication: Discussed with sister who was at the bedside Disposition Plan: Pending clinical improvement  Consultants:   PCCM infectious disease cardiology oncology  Procedures: None Antimicrobials: Vanco cefepime and Flagyl  Subjective: Patient resting in bed appears comfortable on the BiPAP sister by the bedside when called her name she opens her eyes looks at me but hard to communicate because of the BiPAP still reports that she slept better  Objective: Vitals:   04/09/18 0600 04/09/18 0700 04/09/18 0800 04/09/18 0900  BP: (!) 113/56 (!) 131/57 (!) 138/51 (!) 121/56  Pulse: (!) 112 (!) 122 (!) 123 (!) 116  Resp: (!) 29 (!) 26 (!) 32 (!) 36  Temp:   (!) 100.7 F (38.2 C)   TempSrc:   Rectal   SpO2: 98% 96% 93% 95%  Weight:      Height:        Intake/Output Summary (Last 24 hours) at 04/09/2018 0957 Last data filed at 04/09/2018 0400 Gross per 24 hour  Intake 1673.07 ml  Output 600 ml  Net 1073.07 ml   Filed Weights   04/07/18 1614  Weight: 67.1 kg    Examination:  General exam: Appears calm and comfortable  Respiratory system scattered rhonchi in both lung fields to auscultation. Respiratory effort normal. Cardiovascular system: S1 & S2 heard, RRR. No JVD, murmurs, rubs, gallops or clicks. No pedal edema. Gastrointestinal system: Abdomen is nondistended, soft and nontender. No organomegaly or masses felt. Normal bowel sounds heard. Central nervous system: Alert and oriented. No focal neurological deficits. Extremities: Symmetric 5 x 5 power.  Skin: No rashes, lesions or ulcers    Data Reviewed: I have personally reviewed following labs and imaging studies  CBC: Recent Labs  Lab 04/07/18 1141 04/07/18 2042 04/08/18 0216 04/08/18 1626 04/09/18 0422  WBC 11.1* 7.5 9.1 3.7* 3.8*  NEUTROABS 4.2 3.6  --  1.5* 2.4  HGB 7.2* 5.7* 7.6* 8.1* 8.3*  HCT 22.9* 18.4* 24.0* 26.3* 26.4*  MCV 83.3 86.0 85.7 87.1 84.9  PLT 151 109* 107* 74* 66*   Basic Metabolic Panel: Recent Labs  Lab 04/07/18 1143 04/07/18 2042 04/08/18 0216 04/08/18 1626 04/09/18 0422  NA 132* 135  133* 136 139 141  K 2.7* 3.0*  2.9* 3.5 3.2* 3.7  CL 95* 106  106 110 112* 110  CO2 22 17*  18* 19* 19* 22  GLUCOSE 223* 262*  258* 212* 161* 132*  BUN 10 12  11 13 12 11   CREATININE 0.78 0.83  0.82 0.85 0.76 0.65  CALCIUM 8.7* 7.5*  7.3* 7.1* 7.4* 7.7*  MG 1.3* 1.9 1.8  --   --   PHOS  --  2.0* 2.2*  --   --    GFR: Estimated Creatinine Clearance: 68.9 mL/min (by C-G formula based on SCr of 0.65 mg/dL). Liver Function Tests: Recent Labs  Lab 04/07/18 1143 04/07/18 2042 04/08/18 0216 04/08/18 1626  AST 15 11* 24 22  ALT 13 12 16 16   ALKPHOS 92 64 59 54  BILITOT 1.5* 1.8* 3.3* 4.2*  PROT 7.4 5.7* 5.7* 6.0*  ALBUMIN 3.7 2.6* 2.6* 2.5*   No results for input(s): LIPASE, AMYLASE in the last 168 hours. No results for input(s): AMMONIA in the last 168 hours. Coagulation Profile: Recent Labs  Lab 04/07/18 2042  INR 1.6*   Cardiac Enzymes: No results for input(s): CKTOTAL, CKMB, CKMBINDEX, TROPONINI in the last 168 hours. BNP (last 3 results) No results for input(s): PROBNP in the last 8760 hours. HbA1C: Recent Labs    04/08/18 0216  HGBA1C 7.5*   CBG: Recent Labs  Lab 04/08/18 1543 04/08/18 1932 04/08/18 2337 04/09/18 0353 04/09/18 0812  GLUCAP 137* 134* 120* 117* 96   Lipid Profile: No results for input(s): CHOL, HDL, LDLCALC, TRIG, CHOLHDL, LDLDIRECT in the last 72 hours. Thyroid Function Tests: Recent Labs     04/08/18 0216  TSH 0.962   Anemia Panel: Recent Labs    04/08/18 1626  RETICCTPCT 1.6   Sepsis Labs: Recent Labs  Lab 04/07/18 1626 04/07/18 2042  PROCALCITON  --  14.25  LATICACIDVEN 1.9 1.0    Recent Results (from the past 240 hour(s))  Urine Culture     Status: Abnormal (Preliminary result)   Collection Time: 04/07/18 11:15 AM  Result Value Ref Range Status   Specimen Description   Final    URINE, CLEAN CATCH Performed at [facility], [place] [address], [place], [place] [address]    Special Requests   Final    NONE Performed at [facility], [place] [address], [place], [place] [address]  Culture >=100,000 COLONIES/mL GRAM NEGATIVE RODS (A)  Final   Report Status PENDING  Incomplete  Culture, Blood     Status: None (Preliminary result)   Collection Time: 04/07/18 11:27 AM  Result Value Ref Range Status   Specimen Description   Final    BLOOD RIGHT ARM Performed at [facility], [place] [address] [place], [place] [address]    Special Requests   Final    BOTTLES DRAWN AEROBIC AND ANAEROBIC Blood Culture results may not be optimal due to an excessive volume of blood received in culture bottles   Culture   Final    NO GROWTH 2 DAYS Performed at [facility], [place] [address], [place], [place] [address]    Report Status PENDING  Incomplete  Culture, Blood     Status: None (Preliminary result)   Collection Time: 04/07/18 12:15 PM  Result Value Ref Range Status   Specimen Description   Final    PORTA CATH Performed at [facility], [place] [address], [place], [place] [address]    Special Requests   Final    BOTTLES DRAWN AEROBIC AND ANAEROBIC Blood Culture adequate volume   Culture   Final    NO GROWTH 2 DAYS Performed at [facility], [place] [address] [place], [place] [address]    Report Status PENDING  Incomplete  MRSA PCR Screening     Status: None    Collection Time: 04/07/18  8:41 PM  Result Value Ref Range Status   MRSA by PCR NEGATIVE NEGATIVE Final    Comment:        The GeneXpert MRSA Assay (FDA approved for NASAL specimens only), is one component of a comprehensive MRSA colonization surveillance program. It is not intended to diagnose MRSA infection nor to guide or monitor treatment for MRSA infections. Performed at [facility], [place] [address] [place], [place] [address]   Gastrointestinal Panel by PCR , Stool     Status: None   Collection Time: 04/08/18  1:06 AM  Result Value Ref Range Status   Campylobacter species NOT DETECTED NOT DETECTED Final   Plesimonas shigelloides NOT DETECTED NOT DETECTED Final   Salmonella species NOT DETECTED NOT DETECTED Final   Yersinia enterocolitica NOT DETECTED NOT DETECTED Final   Vibrio species NOT DETECTED NOT DETECTED Final   Vibrio cholerae NOT DETECTED NOT DETECTED Final   Enteroaggregative E coli (EAEC) NOT DETECTED NOT DETECTED Final   Enteropathogenic E coli (EPEC) NOT DETECTED NOT DETECTED Final   Enterotoxigenic E coli (ETEC) NOT DETECTED NOT DETECTED Final   Shiga like toxin producing E coli (STEC) NOT DETECTED NOT DETECTED Final   Shigella/Enteroinvasive E coli (EIEC) NOT DETECTED NOT DETECTED Final   Cryptosporidium NOT DETECTED NOT DETECTED Final   Cyclospora cayetanensis NOT DETECTED NOT DETECTED Final   Entamoeba histolytica NOT DETECTED NOT DETECTED Final   Giardia lamblia NOT DETECTED NOT DETECTED Final   Adenovirus F40/41 NOT DETECTED NOT DETECTED Final   Astrovirus NOT DETECTED NOT DETECTED Final   Norovirus GI/GII NOT DETECTED NOT DETECTED Final   Rotavirus A NOT DETECTED NOT DETECTED Final   Sapovirus (I, II, IV, and V) NOT DETECTED NOT DETECTED Final    Comment: Performed at [facility], [place] [place], [place] [address]  Respiratory Panel by PCR     Status: None   Collection Time: 04/08/18  1:06 AM  Result Value Ref  Range Status  Adenovirus NOT DETECTED NOT DETECTED Final   Coronavirus 229E NOT DETECTED NOT DETECTED Final    Comment: (NOTE) The Coronavirus on the Respiratory Panel, DOES NOT test for the novel  Coronavirus (2019 nCoV)    Coronavirus HKU1 NOT DETECTED NOT DETECTED Final   Coronavirus NL63 NOT DETECTED NOT DETECTED Final   Coronavirus OC43 NOT DETECTED NOT DETECTED Final   Metapneumovirus NOT DETECTED NOT DETECTED Final   Rhinovirus / Enterovirus NOT DETECTED NOT DETECTED Final   Influenza A NOT DETECTED NOT DETECTED Final   Influenza B NOT DETECTED NOT DETECTED Final   Parainfluenza Virus 1 NOT DETECTED NOT DETECTED Final   Parainfluenza Virus 2 NOT DETECTED NOT DETECTED Final   Parainfluenza Virus 3 NOT DETECTED NOT DETECTED Final   Parainfluenza Virus 4 NOT DETECTED NOT DETECTED Final   Respiratory Syncytial Virus NOT DETECTED NOT DETECTED Final   Bordetella pertussis NOT DETECTED NOT DETECTED Final   Chlamydophila pneumoniae NOT DETECTED NOT DETECTED Final   Mycoplasma pneumoniae NOT DETECTED NOT DETECTED Final    Comment: Performed at [facility], [place] [address] [place], [place] [address]  C difficile quick scan w PCR reflex     Status: None   Collection Time: 04/08/18  2:30 PM  Result Value Ref Range Status   C Diff antigen NEGATIVE NEGATIVE Final   C Diff toxin NEGATIVE NEGATIVE Final   C Diff interpretation No C. difficile detected.  Final    Comment: Performed at [facility], [place] [address], [place], [place] [address]         Radiology Studies: Ct Head Wo Contrast  Result Date: 04/08/2018 CLINICAL DATA:  Sepsis with known metastatic breast cancer EXAM: CT HEAD WITHOUT CONTRAST TECHNIQUE: Contiguous axial images were obtained from the base of the skull through the vertex without intravenous contrast. COMPARISON:  Brain MRI 03/16/2018 FINDINGS: Brain: There is focal hypoattenuation in the right occipital lobe, corresponding to the site of  the known metastatic lesion. No acute hemorrhage or other extra-axial collection. No midline shift or significant mass effect. Normal size of the ventricles. Vascular: No abnormal hyperdensity of the major intracranial arteries or dural venous sinuses. No intracranial atherosclerosis. Skull: The visualized skull base, calvarium and extracranial soft tissues are normal. Sinuses/Orbits: Postsurgical changes of the maxillary and ethmoid sinuses. No mastoid or middle ear effusion. The orbits are normal. IMPRESSION: 1. Hypoattenuation in the right occipital lobe, corresponding to known metastatic lesion. 2. No acute hemorrhage. Electronically Signed   By: Ulyses Jarred M.D.   On: 04/08/2018 19:18   Ct Chest W Contrast  Result Date: 04/08/2018 CLINICAL DATA:  58 year old female with metastatic breast cancer, fever, sepsis. EXAM: CT CHEST, ABDOMEN, AND PELVIS WITH CONTRAST TECHNIQUE: Multidetector CT imaging of the chest, abdomen and pelvis was performed following the standard protocol during bolus administration of intravenous contrast. CONTRAST:  175mL OMNIPAQUE IOHEXOL 300 MG/ML  SOLN COMPARISON:  Portable chest 04/07/2018. CT Chest, Abdomen, and Pelvis 03/11/2018. FINDINGS: CT CHEST FINDINGS Cardiovascular: No pericardial effusion. Stable cardiac size. Negative thoracic aorta. Central pulmonary arteries are patent. Right IJ chest porta cath in place. Mediastinum/Nodes: Small but progressed since February right paratracheal lymph nodes individually 9 millimeter short axis. No other mediastinal lymphadenopathy; postinflammatory coarsely calcified subcarinal and right hilar nodes are stable. Lungs/Pleura: New subtotal consolidation of the right upper and lower lobes with superimposed small layering right pleural effusion. This obscures some of the right lung nodule/metastases seen in February. Patchy discontinuous peribronchial opacity with some  consolidation in the right middle lobe is new. Major airways are patent.  New consolidation in the medial basal segment of the left lower lobe with additional new indistinct peribronchial ground-glass and nodules in the other left lower lobe segments. Similar nodularity in the lingula with some early consolidation. In the left upper lobe some of the formally circumscribed metastatic appearing nodules now are indistinct with marginal ground-glass opacity (series 4, image 47). This is also true of the 1 or 2 visible right lung nodules. Musculoskeletal: No acute or destructive osseous lesion identified. New subcutaneous edema along the left lateral chest wall. Left axillary lymphadenopathy has mildly regressed. CT ABDOMEN PELVIS FINDINGS Hepatobiliary: Mild hepatomegaly. Trace perihepatic free fluid. Multiple hypodense liver metastases redemonstrated. The largest have not significantly changed in size or configuration. No definite new hepatic metastasis. Gallbladder remains within normal limits. Pancreas: Negative. Spleen: Stable mild splenic enlargement and small calcified granulomas. Adrenals/Urinary Tract: Negative adrenal glands. Bilateral renal enhancement and contrast excretion is symmetric and within normal limits. Benign left renal parapelvic cysts. Decompressed proximal ureters. Unremarkable urinary bladder. Stomach/Bowel: Sigmoid diverticulosis with no active inflammation. Mild motion artifact in the upper abdomen. No dilated or definitely inflamed large or small bowel loops. Decompressed stomach and duodenum. No free air. Trace perihepatic free fluid. Vascular/Lymphatic: Mild Aortoiliac calcified atherosclerosis. Major arterial structures are patent. Portal venous system seems patent. No lymphadenopathy. Reproductive: Stable and within normal limits. Other: No pelvic free fluid. Musculoskeletal: No acute or destructive osseous lesion identified. Mild bilateral subcutaneous flank edema. IMPRESSION: 1. Severe right lung multilobar pneumonia. Multifocal early pneumonia in the left  lung. Associated small layering right pleural effusion. 2. Evolution of the pulmonary nodules/presumed pulmonary metastases visible today, with irregular and indistinct margins. This might reflect treatment affect. 3. Mildly enlarged right paratracheal mediastinal nodes, favor reactive rather than metastatic. 4. Stable liver metastases. Mild hepatomegaly and trace perihepatic free fluid. 5. No new metastatic disease identified. Electronically Signed   By: Genevie Ann M.D.   On: 04/08/2018 19:32   Ct Abdomen Pelvis W Contrast  Result Date: 04/08/2018 CLINICAL DATA:  58 year old female with metastatic breast cancer, fever, sepsis. EXAM: CT CHEST, ABDOMEN, AND PELVIS WITH CONTRAST TECHNIQUE: Multidetector CT imaging of the chest, abdomen and pelvis was performed following the standard protocol during bolus administration of intravenous contrast. CONTRAST:  182mL OMNIPAQUE IOHEXOL 300 MG/ML  SOLN COMPARISON:  Portable chest 04/07/2018. CT Chest, Abdomen, and Pelvis 03/11/2018. FINDINGS: CT CHEST FINDINGS Cardiovascular: No pericardial effusion. Stable cardiac size. Negative thoracic aorta. Central pulmonary arteries are patent. Right IJ chest porta cath in place. Mediastinum/Nodes: Small but progressed since February right paratracheal lymph nodes individually 9 millimeter short axis. No other mediastinal lymphadenopathy; postinflammatory coarsely calcified subcarinal and right hilar nodes are stable. Lungs/Pleura: New subtotal consolidation of the right upper and lower lobes with superimposed small layering right pleural effusion. This obscures some of the right lung nodule/metastases seen in February. Patchy discontinuous peribronchial opacity with some consolidation in the right middle lobe is new. Major airways are patent. New consolidation in the medial basal segment of the left lower lobe with additional new indistinct peribronchial ground-glass and nodules in the other left lower lobe segments. Similar nodularity  in the lingula with some early consolidation. In the left upper lobe some of the formally circumscribed metastatic appearing nodules now are indistinct with marginal ground-glass opacity (series 4, image 47). This is also true of the 1 or 2 visible right lung nodules. Musculoskeletal: No acute or destructive osseous lesion identified.  New subcutaneous edema along the left lateral chest wall. Left axillary lymphadenopathy has mildly regressed. CT ABDOMEN PELVIS FINDINGS Hepatobiliary: Mild hepatomegaly. Trace perihepatic free fluid. Multiple hypodense liver metastases redemonstrated. The largest have not significantly changed in size or configuration. No definite new hepatic metastasis. Gallbladder remains within normal limits. Pancreas: Negative. Spleen: Stable mild splenic enlargement and small calcified granulomas. Adrenals/Urinary Tract: Negative adrenal glands. Bilateral renal enhancement and contrast excretion is symmetric and within normal limits. Benign left renal parapelvic cysts. Decompressed proximal ureters. Unremarkable urinary bladder. Stomach/Bowel: Sigmoid diverticulosis with no active inflammation. Mild motion artifact in the upper abdomen. No dilated or definitely inflamed large or small bowel loops. Decompressed stomach and duodenum. No free air. Trace perihepatic free fluid. Vascular/Lymphatic: Mild Aortoiliac calcified atherosclerosis. Major arterial structures are patent. Portal venous system seems patent. No lymphadenopathy. Reproductive: Stable and within normal limits. Other: No pelvic free fluid. Musculoskeletal: No acute or destructive osseous lesion identified. Mild bilateral subcutaneous flank edema. IMPRESSION: 1. Severe right lung multilobar pneumonia. Multifocal early pneumonia in the left lung. Associated small layering right pleural effusion. 2. Evolution of the pulmonary nodules/presumed pulmonary metastases visible today, with irregular and indistinct margins. This might reflect  treatment affect. 3. Mildly enlarged right paratracheal mediastinal nodes, favor reactive rather than metastatic. 4. Stable liver metastases. Mild hepatomegaly and trace perihepatic free fluid. 5. No new metastatic disease identified. Electronically Signed   By: Genevie Ann M.D.   On: 04/08/2018 19:32   Dg Chest Port 1 View  Result Date: 04/07/2018 CLINICAL DATA:  Breast cancer with metastases.  Fever. EXAM: PORTABLE CHEST 1 VIEW COMPARISON:  CT scan March 11, 2018 FINDINGS: There is an oval hazy opacity in the right upper lobe which correlates with the site of a previously identified nodule, likely a metastatic lesion. No other definitive pulmonary nodules. The heart, hila, and mediastinum are unremarkable. No pneumothorax. The right Port-A-Cath terminates in the SVC. No other acute abnormalities. IMPRESSION: 1. The ill-defined hazy rounded opacity in the right upper lobe correlates with a previously identified nodule thought to be a metastatic lesion. No other abnormalities identified on today's study. Electronically Signed   By: Dorise Bullion III M.D   On: 04/07/2018 16:54        Scheduled Meds: . chlorhexidine  15 mL Mouth Rinse BID  . Chlorhexidine Gluconate Cloth  6 each Topical Daily  . Chlorhexidine Gluconate Cloth  6 each Topical Daily  . diltiazem  10 mg Intravenous Once  . feeding supplement  1 Container Oral TID BM  . insulin aspart  0-9 Units Subcutaneous Q4H  . insulin detemir  15 Units Subcutaneous Daily  . mouth rinse  15 mL Mouth Rinse q12n4p  . montelukast  10 mg Oral Daily  . multivitamin with minerals  1 tablet Oral Daily  . phosphorus  250 mg Oral BID   Continuous Infusions: . sodium chloride    . acetaminophen Stopped (04/08/18 1525)  . ceFEPime (MAXIPIME) IV 2 g (04/09/18 0932)  . diltiazem (CARDIZEM) infusion 7.5 mg/hr (04/09/18 0558)  . metronidazole Stopped (04/09/18 0335)  . vancomycin Stopped (04/08/18 2255)     LOS: 1 day     Georgette Shell, MD  [facility]  If 7PM-7AM, please contact night-coverage [email or web address] Password [facility] 04/09/2018, 9:57 AM

## 2018-04-09 NOTE — Progress Notes (Signed)
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 Continue aggressive supportive care CT imaging shows stable disease.  Recurrent fever, respiratory distress CT imaging confirmed bilateral pneumonia Cultures are pending.  Preliminary diagnosis is likely related to sepsis. She is currently on broad-spectrum IV antibiotics.   She will continue on BiPAP as directed by pulmonologist  Progressive pancytopenia Likely due to bone marrow suppression from recent chemotherapy, on the background history of bone metastasis I recommend transfusion support to keep hemoglobin greater than 8.   She does not need platelet transfusion unless platelet count falls to less than 10,000 or signs of bleeding  Poorly controlled diabetes She will continue insulin treatment  Multiple electrolyte imbalance Replace as needed  Progressive jaundice The elevated bilirubin could be due to cholestasis or sepsis CT imaging did not show biliary obstruction Hemolysis from recent transfuse blood is also a possibility Observe only Adjust medication as needed  CODE STATUS Full code  Discharge planning Anticipate she will be in the hospital for the next few days I will continue to follow I have addressed all the questions and concerns by family members. [name], MD 04/09/2018  8:46 AM   Subjective:  Unable to talk to the patient as BiPAP mask is present.  She appears alert.  Family member stated she has been feeling claustrophobic on BiPAP.  According to nursing staff, her fever is improving and they are attempting to wean her off cooling blankets.  Her vital signs are stable except for persistent tachycardia and dyspnea.  Cultures are pending.  CT scan was performed last night, available for review.  Objective:  Vitals:   04/09/18 0700 04/09/18 0800  BP: (!) 131/57 (!) 138/51   Pulse: (!) 122 (!) 123  Resp: (!) 26 (!) 32  Temp:    SpO2: 96% 93%     Intake/Output Summary (Last 24 hours) at 04/09/2018 0846 Last data filed at 04/09/2018 0400 Gross per 24 hour  Intake 1785.07 ml  Output 600 ml  Net 1185.07 ml    GENERAL:alert, on BiPAP. SKIN: She looks mildly jaundiced and pale EYES: normal, Conjunctiva are mildly jaundiced LUNGS: Reduced breath sound on the right lung compared to the left.  No crackles HEART: Tachycardia noted with mild lower extremity edema NEURO: alert with fluent speech, no focal motor/sensory deficits   Labs:  Lab Results  Component Value Date   WBC 3.8 (L) 04/09/2018   HGB 8.3 (L) 04/09/2018   HCT 26.4 (L) 04/09/2018   MCV 84.9 04/09/2018   PLT 66 (L) 04/09/2018   NEUTROABS 2.4 04/09/2018    Lab Results  Component Value Date   NA 141 04/09/2018   K 3.7 04/09/2018   CL 110 04/09/2018   CO2 22 04/09/2018    Studies: I have personally reviewed the imaging studies and I have also reviewed the CT imaging with the patient and family Ct Head Wo Contrast  Result Date: 04/08/2018 CLINICAL DATA:  Sepsis with known metastatic breast cancer EXAM: CT HEAD WITHOUT CONTRAST TECHNIQUE: Contiguous axial images were obtained from the base of the skull through the vertex without intravenous contrast. COMPARISON:  Brain MRI 03/16/2018 FINDINGS: Brain: There is focal hypoattenuation in the right occipital lobe, corresponding to the site of the known metastatic lesion. No acute hemorrhage or other extra-axial collection. No midline shift or significant mass effect. Normal size of  the ventricles. Vascular: No abnormal hyperdensity of the major intracranial arteries or dural venous sinuses. No intracranial atherosclerosis. Skull: The visualized skull base, calvarium and extracranial soft tissues are normal. Sinuses/Orbits: Postsurgical changes of the maxillary and ethmoid sinuses. No mastoid or middle ear effusion. The orbits are normal. IMPRESSION: 1.  Hypoattenuation in the right occipital lobe, corresponding to known metastatic lesion. 2. No acute hemorrhage. Electronically Signed   By: Ulyses Jarred M.D.   On: 04/08/2018 19:18   Ct Chest W Contrast  Result Date: 04/08/2018 CLINICAL DATA:  58 year old female with metastatic breast cancer, fever, sepsis. EXAM: CT CHEST, ABDOMEN, AND PELVIS WITH CONTRAST TECHNIQUE: Multidetector CT imaging of the chest, abdomen and pelvis was performed following the standard protocol during bolus administration of intravenous contrast. CONTRAST:  129mL OMNIPAQUE IOHEXOL 300 MG/ML  SOLN COMPARISON:  Portable chest 04/07/2018. CT Chest, Abdomen, and Pelvis 03/11/2018. FINDINGS: CT CHEST FINDINGS Cardiovascular: No pericardial effusion. Stable cardiac size. Negative thoracic aorta. Central pulmonary arteries are patent. Right IJ chest porta cath in place. Mediastinum/Nodes: Small but progressed since February right paratracheal lymph nodes individually 9 millimeter short axis. No other mediastinal lymphadenopathy; postinflammatory coarsely calcified subcarinal and right hilar nodes are stable. Lungs/Pleura: New subtotal consolidation of the right upper and lower lobes with superimposed small layering right pleural effusion. This obscures some of the right lung nodule/metastases seen in February. Patchy discontinuous peribronchial opacity with some consolidation in the right middle lobe is new. Major airways are patent. New consolidation in the medial basal segment of the left lower lobe with additional new indistinct peribronchial ground-glass and nodules in the other left lower lobe segments. Similar nodularity in the lingula with some early consolidation. In the left upper lobe some of the formally circumscribed metastatic appearing nodules now are indistinct with marginal ground-glass opacity (series 4, image 47). This is also true of the 1 or 2 visible right lung nodules. Musculoskeletal: No acute or destructive osseous  lesion identified. New subcutaneous edema along the left lateral chest wall. Left axillary lymphadenopathy has mildly regressed. CT ABDOMEN PELVIS FINDINGS Hepatobiliary: Mild hepatomegaly. Trace perihepatic free fluid. Multiple hypodense liver metastases redemonstrated. The largest have not significantly changed in size or configuration. No definite new hepatic metastasis. Gallbladder remains within normal limits. Pancreas: Negative. Spleen: Stable mild splenic enlargement and small calcified granulomas. Adrenals/Urinary Tract: Negative adrenal glands. Bilateral renal enhancement and contrast excretion is symmetric and within normal limits. Benign left renal parapelvic cysts. Decompressed proximal ureters. Unremarkable urinary bladder. Stomach/Bowel: Sigmoid diverticulosis with no active inflammation. Mild motion artifact in the upper abdomen. No dilated or definitely inflamed large or small bowel loops. Decompressed stomach and duodenum. No free air. Trace perihepatic free fluid. Vascular/Lymphatic: Mild Aortoiliac calcified atherosclerosis. Major arterial structures are patent. Portal venous system seems patent. No lymphadenopathy. Reproductive: Stable and within normal limits. Other: No pelvic free fluid. Musculoskeletal: No acute or destructive osseous lesion identified. Mild bilateral subcutaneous flank edema. IMPRESSION: 1. Severe right lung multilobar pneumonia. Multifocal early pneumonia in the left lung. Associated small layering right pleural effusion. 2. Evolution of the pulmonary nodules/presumed pulmonary metastases visible today, with irregular and indistinct margins. This might reflect treatment affect. 3. Mildly enlarged right paratracheal mediastinal nodes, favor reactive rather than metastatic. 4. Stable liver metastases. Mild hepatomegaly and trace perihepatic free fluid. 5. No new metastatic disease identified. Electronically Signed   By: Genevie Ann M.D.   On: 04/08/2018 19:32   Ct Abdomen Pelvis  W Contrast  Result Date: 04/08/2018 CLINICAL DATA:  58 year old female with metastatic breast cancer, fever, sepsis. EXAM: CT CHEST, ABDOMEN, AND PELVIS WITH CONTRAST TECHNIQUE: Multidetector CT imaging of the chest, abdomen and pelvis was performed following the standard protocol during bolus administration of intravenous contrast. CONTRAST:  116mL OMNIPAQUE IOHEXOL 300 MG/ML  SOLN COMPARISON:  Portable chest 04/07/2018. CT Chest, Abdomen, and Pelvis 03/11/2018. FINDINGS: CT CHEST FINDINGS Cardiovascular: No pericardial effusion. Stable cardiac size. Negative thoracic aorta. Central pulmonary arteries are patent. Right IJ chest porta cath in place. Mediastinum/Nodes: Small but progressed since February right paratracheal lymph nodes individually 9 millimeter short axis. No other mediastinal lymphadenopathy; postinflammatory coarsely calcified subcarinal and right hilar nodes are stable. Lungs/Pleura: New subtotal consolidation of the right upper and lower lobes with superimposed small layering right pleural effusion. This obscures some of the right lung nodule/metastases seen in February. Patchy discontinuous peribronchial opacity with some consolidation in the right middle lobe is new. Major airways are patent. New consolidation in the medial basal segment of the left lower lobe with additional new indistinct peribronchial ground-glass and nodules in the other left lower lobe segments. Similar nodularity in the lingula with some early consolidation. In the left upper lobe some of the formally circumscribed metastatic appearing nodules now are indistinct with marginal ground-glass opacity (series 4, image 47). This is also true of the 1 or 2 visible right lung nodules. Musculoskeletal: No acute or destructive osseous lesion identified. New subcutaneous edema along the left lateral chest wall. Left axillary lymphadenopathy has mildly regressed. CT ABDOMEN PELVIS FINDINGS Hepatobiliary: Mild hepatomegaly. Trace  perihepatic free fluid. Multiple hypodense liver metastases redemonstrated. The largest have not significantly changed in size or configuration. No definite new hepatic metastasis. Gallbladder remains within normal limits. Pancreas: Negative. Spleen: Stable mild splenic enlargement and small calcified granulomas. Adrenals/Urinary Tract: Negative adrenal glands. Bilateral renal enhancement and contrast excretion is symmetric and within normal limits. Benign left renal parapelvic cysts. Decompressed proximal ureters. Unremarkable urinary bladder. Stomach/Bowel: Sigmoid diverticulosis with no active inflammation. Mild motion artifact in the upper abdomen. No dilated or definitely inflamed large or small bowel loops. Decompressed stomach and duodenum. No free air. Trace perihepatic free fluid. Vascular/Lymphatic: Mild Aortoiliac calcified atherosclerosis. Major arterial structures are patent. Portal venous system seems patent. No lymphadenopathy. Reproductive: Stable and within normal limits. Other: No pelvic free fluid. Musculoskeletal: No acute or destructive osseous lesion identified. Mild bilateral subcutaneous flank edema. IMPRESSION: 1. Severe right lung multilobar pneumonia. Multifocal early pneumonia in the left lung. Associated small layering right pleural effusion. 2. Evolution of the pulmonary nodules/presumed pulmonary metastases visible today, with irregular and indistinct margins. This might reflect treatment affect. 3. Mildly enlarged right paratracheal mediastinal nodes, favor reactive rather than metastatic. 4. Stable liver metastases. Mild hepatomegaly and trace perihepatic free fluid. 5. No new metastatic disease identified. Electronically Signed   By: Genevie Ann M.D.   On: 04/08/2018 19:32   Dg Chest Port 1 View  Result Date: 04/07/2018 CLINICAL DATA:  Breast cancer with metastases.  Fever. EXAM: PORTABLE CHEST 1 VIEW COMPARISON:  CT scan March 11, 2018 FINDINGS: There is an oval hazy opacity in  the right upper lobe which correlates with the site of a previously identified nodule, likely a metastatic lesion. No other definitive pulmonary nodules. The heart, hila, and mediastinum are unremarkable. No pneumothorax. The right Port-A-Cath terminates in the SVC. No other acute abnormalities. IMPRESSION: 1. The ill-defined hazy rounded opacity in the right upper lobe correlates with a previously identified nodule thought to be a metastatic lesion. No  other abnormalities identified on today's study. Electronically Signed   By: Dorise Bullion III M.D   On: 04/07/2018 16:54

## 2018-04-09 NOTE — Consult Note (Addendum)
Cardiology Consult    Patient ID: Stacy Shelton MRN: [id number], DOB/AGE: January 03, 1961   Admit date: 04/07/2018 Date of Consult: 04/09/2018  Primary Physician: Robyne Peers, MD Primary Cardiologist: New to [place] (Dr. Margaretann Loveless) Requesting Provider: Jacki Cones, MD  Patient Profile    Stacy Shelton is a 58 y.o. 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 Zigmund Daniel.  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 Patient states she was diagnosed with a heart murmur when she was young which was worked up at that time but has never seen a Film/video editor or had any type of cardiac work-up since then.   Patient presented on 04/07/2018 from her Oncologist's office with a fever, nausea, and diarrhea and was admitted with severe sepsis. Her Oncologist began the work-up at their office by ordering labs and blood cultures as well as started patient on Levaquin because urinalysis was consistent with a UTI. While in the ED, patient's O2 sats dropped into the 80's and patient was placed on nasal cannula. She also became hypotensive and did not respond with fluids so she was started on pressors and was admitted to the the ICU. Neosynephrine was started in the ICU and patient was given PRBCs for a hemoglobin of 5.7. Patient was then placed on BiPAP due to increased work of breathing.  Cardiology was consulted today for tachycardia. Patient currently on BiPAP so difficult to have detailed conversation but was able to obtain history from patient and her sister. Patient reports some palpitations with the tachycardia but denies any chest pain. She feel like her  breathing has improved some with the BiPAP. She denies any lightheadedness or dizziness. She has been having difficult breathing at night while in the hospital. He has had some claustrophobia with the BiPAP mask and increased shortness of breath with this that improves when she is able to calm herself down. No real orthopnea, PND, or edema. Her nausea has improved but she continues to have diarrhea and a fever. Patient reports she has had a nasal congestion and a cough for months. No abnormal bleeding.   Patient denies any history of tobacco use. She has no known family history of heart disease but her father did have a stroke in his 35's.   Past Medical History   Past Medical History:  Diagnosis Date  . Cancer [facility])     Past Surgical History:  Procedure Laterality Date  . IR IMAGING GUIDED PORT INSERTION  03/24/2018  . KIDNEY STONE SURGERY    . SINOSCOPY    . WISDOM TOOTH EXTRACTION       Allergies  Allergies  Allergen Reactions  . Nsaids Anaphylaxis    Inpatient Medications    . chlorhexidine  15 mL Mouth Rinse BID  . Chlorhexidine Gluconate Cloth  6 each Topical Daily  . Chlorhexidine Gluconate Cloth  6 each Topical Daily  . diltiazem  10 mg Intravenous Once  . feeding supplement  1 Container Oral TID BM  . insulin aspart  0-9 Units Subcutaneous Q4H  . insulin detemir  15 Units Subcutaneous Daily  . mouth rinse  15 mL Mouth Rinse q12n4p  . montelukast  10 mg Oral Daily  . multivitamin with minerals  1 tablet Oral Daily  . phosphorus  250 mg Oral BID    Family History    Family History  Problem Relation Age of Onset  . Allergic rhinitis Neg Hx   . Angioedema Neg Hx   . Asthma Neg Hx   . Atopy Neg Hx   . Eczema Neg Hx   . Immunodeficiency Neg Hx   . Urticaria Neg Hx    has no family status information on file.    Social History    Social History   Socioeconomic History  . Marital status: Single    Spouse name: Not on file  . Number of children: Not on  file  . Years of education: Not on file  . Highest education level: Not on file  Occupational History  . Not on file  Social Needs  . Financial resource strain: Not on file  . Food insecurity:    Worry: Not on file    Inability: Not on file  . Transportation needs:    Medical: Not on file    Non-medical: Not on file  Tobacco Use  . Smoking status: Never Smoker  . Smokeless tobacco: Never Used  Substance and Sexual Activity  . Alcohol use: Yes    Alcohol/week: 0.0 standard drinks  . Drug use: No  . Sexual activity: Not on file  Lifestyle  . Physical activity:    Days per week: Not on file    Minutes per session: Not on file  . Stress: Not on file  Relationships  . Social connections:    Talks on phone: Not on file    Gets together: Not on file    Attends religious service: Not on file    Active member of club or organization: Not on file    Attends meetings of clubs or organizations: Not on file    Relationship status: Not on file  . Intimate partner violence:    Fear of current or ex partner: Not on file    Emotionally abused: Not on file    Physically abused: Not on file    Forced sexual activity: Not on file  Other Topics Concern  . Not on file  Social History Narrative  . Not on file     Review of Systems    Review of Systems  Constitutional: Positive for fever and malaise/fatigue.  HENT: Positive for congestion.   Respiratory: Positive for cough and shortness of breath. Negative for hemoptysis.   Cardiovascular: Positive for palpitations. Negative for chest pain, orthopnea, leg swelling and PND.  Gastrointestinal: Positive for diarrhea. Negative for blood in stool.  Genitourinary: Negative for hematuria.  Neurological: Negative for dizziness.  Endo/Heme/Allergies: Does not bruise/bleed easily.  Psychiatric/Behavioral: Negative for substance abuse.    Physical Exam    Blood pressure (!) 105/53, pulse (!) 110, temperature 99.5 F (37.5 C), temperature  source Rectal, resp. rate (!) 31, height 5\' 2"  (1.575 m), weight 67.1 kg, last menstrual period 10/30/2010, SpO2 97 %.  General: 58 y.o. female resting comfortably with BiPAP. HEENT: Normal  Neck: Supple.  Lungs: Currently on BiPAP. Diminished breath sound but clear to auscultation. No wheezes, rhonchi, or rales. Heart: Tachycardic with regular rhythm. Distinct S1 and S2. No murmurs, gallops, or rubs.  Abdomen: Soft, non-distended, and non-tender to palpation. Bowel sounds present. Extremities: No lower extremity edema. Radial and and distal pedal pulses 2+  and equal bilaterally. Skin: Warm and dry. Neuro: Alert and oriented x3. No focal deficits. Moves all extremities spontaneously. Psych: Normal affect.  Labs    Troponin (Point of Care Test) No results for input(s): TROPIPOC in the last 72 hours. No results for input(s): CKTOTAL, CKMB, TROPONINI in the last 72 hours. Lab Results  Component Value Date   WBC 3.8 (L) 04/09/2018   HGB 8.3 (L) 04/09/2018   HCT 26.4 (L) 04/09/2018   MCV 84.9 04/09/2018   PLT 66 (L) 04/09/2018    Recent Labs  Lab 04/08/18 1626 04/09/18 0422  NA 139 141  K 3.2* 3.7  CL 112* 110  CO2 19* 22  BUN 12 11  CREATININE 0.76 0.65  CALCIUM 7.4* 7.7*  PROT 6.0*  --   BILITOT 4.2*  --   ALKPHOS 54  --   ALT 16  --   AST 22  --   GLUCOSE 161* 132*   No results found for: CHOL, HDL, LDLCALC, TRIG No results found for: [facility]   Radiology Studies    Ct Head Wo Contrast  Result Date: 04/08/2018 CLINICAL DATA:  Sepsis with known metastatic breast cancer EXAM: CT HEAD WITHOUT CONTRAST TECHNIQUE: Contiguous axial images were obtained from the base of the skull through the vertex without intravenous contrast. COMPARISON:  Brain MRI 03/16/2018 FINDINGS: Brain: There is focal hypoattenuation in the right occipital lobe, corresponding to the site of the known metastatic lesion. No acute hemorrhage or other extra-axial collection. No midline shift or significant  mass effect. Normal size of the ventricles. Vascular: No abnormal hyperdensity of the major intracranial arteries or dural venous sinuses. No intracranial atherosclerosis. Skull: The visualized skull base, calvarium and extracranial soft tissues are normal. Sinuses/Orbits: Postsurgical changes of the maxillary and ethmoid sinuses. No mastoid or middle ear effusion. The orbits are normal. IMPRESSION: 1. Hypoattenuation in the right occipital lobe, corresponding to known metastatic lesion. 2. No acute hemorrhage. Electronically Signed   By: Ulyses Jarred M.D.   On: 04/08/2018 19:18   Ct Chest W Contrast  Result Date: 04/08/2018 CLINICAL DATA:  58 year old female with metastatic breast cancer, fever, sepsis. EXAM: CT CHEST, ABDOMEN, AND PELVIS WITH CONTRAST TECHNIQUE: Multidetector CT imaging of the chest, abdomen and pelvis was performed following the standard protocol during bolus administration of intravenous contrast. CONTRAST:  112mL OMNIPAQUE IOHEXOL 300 MG/ML  SOLN COMPARISON:  Portable chest 04/07/2018. CT Chest, Abdomen, and Pelvis 03/11/2018. FINDINGS: CT CHEST FINDINGS Cardiovascular: No pericardial effusion. Stable cardiac size. Negative thoracic aorta. Central pulmonary arteries are patent. Right IJ chest porta cath in place. Mediastinum/Nodes: Small but progressed since February right paratracheal lymph nodes individually 9 millimeter short axis. No other mediastinal lymphadenopathy; postinflammatory coarsely calcified subcarinal and right hilar nodes are stable. Lungs/Pleura: New subtotal consolidation of the right upper and lower lobes with superimposed small layering right pleural effusion. This obscures some of the right lung nodule/metastases seen in February. Patchy discontinuous peribronchial opacity with some consolidation in the right middle lobe is new. Major airways are patent. New consolidation in the medial basal segment of the left lower lobe with additional new indistinct peribronchial  ground-glass and nodules in the other left lower lobe segments. Similar nodularity in the lingula with some early consolidation. In the left upper lobe some of the formally circumscribed metastatic appearing nodules now are indistinct with marginal ground-glass opacity (series 4, image 47). This is also true of the 1 or 2 visible right lung nodules. Musculoskeletal: No acute or destructive osseous  lesion identified. New subcutaneous edema along the left lateral chest wall. Left axillary lymphadenopathy has mildly regressed. CT ABDOMEN PELVIS FINDINGS Hepatobiliary: Mild hepatomegaly. Trace perihepatic free fluid. Multiple hypodense liver metastases redemonstrated. The largest have not significantly changed in size or configuration. No definite new hepatic metastasis. Gallbladder remains within normal limits. Pancreas: Negative. Spleen: Stable mild splenic enlargement and small calcified granulomas. Adrenals/Urinary Tract: Negative adrenal glands. Bilateral renal enhancement and contrast excretion is symmetric and within normal limits. Benign left renal parapelvic cysts. Decompressed proximal ureters. Unremarkable urinary bladder. Stomach/Bowel: Sigmoid diverticulosis with no active inflammation. Mild motion artifact in the upper abdomen. No dilated or definitely inflamed large or small bowel loops. Decompressed stomach and duodenum. No free air. Trace perihepatic free fluid. Vascular/Lymphatic: Mild Aortoiliac calcified atherosclerosis. Major arterial structures are patent. Portal venous system seems patent. No lymphadenopathy. Reproductive: Stable and within normal limits. Other: No pelvic free fluid. Musculoskeletal: No acute or destructive osseous lesion identified. Mild bilateral subcutaneous flank edema. IMPRESSION: 1. Severe right lung multilobar pneumonia. Multifocal early pneumonia in the left lung. Associated small layering right pleural effusion. 2. Evolution of the pulmonary nodules/presumed pulmonary  metastases visible today, with irregular and indistinct margins. This might reflect treatment affect. 3. Mildly enlarged right paratracheal mediastinal nodes, favor reactive rather than metastatic. 4. Stable liver metastases. Mild hepatomegaly and trace perihepatic free fluid. 5. No new metastatic disease identified. Electronically Signed   By: Genevie Ann M.D.   On: 04/08/2018 19:32   Ct Chest W Contrast  Result Date: 03/11/2018 CLINICAL DATA:  Weight loss in thrombocytopenia. EXAM: CT CHEST, ABDOMEN, AND PELVIS WITH CONTRAST TECHNIQUE: Multidetector CT imaging of the chest, abdomen and pelvis was performed following the standard protocol during bolus administration of intravenous contrast. CONTRAST:  134mL OMNIPAQUE IOHEXOL 300 MG/ML SOLN, 68mL OMNIPAQUE IOHEXOL 300 MG/ML SOLN COMPARISON:  None. FINDINGS: CT CHEST FINDINGS Cardiovascular: The heart size is normal. No substantial pericardial effusion. Atherosclerotic calcification is noted in the wall of the thoracic aorta. Mediastinum/Nodes: 13 mm short axis precarinal lymph node mildly enlarged. 13 mm densely calcified subcarinal lymph node is associated with calcified nodal tissue in the right hilum. No left hilar lymphadenopathy. 9 mm short axis left axillary lymph node is associated with small additional left axillary and subpectoral nodes. No right hilar adenopathy. Lungs/Pleura: The central tracheobronchial airways are patent. 2.7 x 2.2 cm central right upper lobe nodule identified on 56/4. Numerous additional pulmonary nodules are identified bilaterally ranging in size from several mm up to 10 mm in the right upper lobe. These nodules have a slight upper lung predominance. Index 7 mm right lower lobe nodule identified on 64/4. No pleural effusion. Calcified granuloma noted posterior right costophrenic sulcus. Musculoskeletal: No worrisome lytic or sclerotic osseous abnormality. Masslike asymmetry of left breast tissue identified in the medial inferior  quadrant of the left breast (40/2) measuring 2.6 x 2.3 cm. Coronal imaging suggests that this lesion has spiculated margins (22/5). CT ABDOMEN PELVIS FINDINGS Hepatobiliary: Multiple (approximately 10) ill-defined liver lesions are concerning for metastases. Dominant lesions are a 3.6 cm lesion in the right liver (54/2) and a 3.8 cm lesion identified lateral segment left liver (58/2). Other lesions range in size from 8 mm up to 2.6 cm. There is no evidence for gallstones, gallbladder wall thickening, or pericholecystic fluid. No intrahepatic or extrahepatic biliary dilation. Pancreas: No focal mass lesion. No dilatation of the main duct. No intraparenchymal cyst. No peripancreatic edema. Spleen: Calcified granulomata. Adrenals/Urinary Tract: No adrenal nodule or mass. Kidneys  unremarkable. Central sinus cysts noted in the left kidney. No evidence for hydroureter. The urinary bladder appears normal for the degree of distention. Stomach/Bowel: Stomach is unremarkable. No gastric wall thickening. No evidence of outlet obstruction. Duodenum is normally positioned as is the ligament of Treitz. No small bowel wall thickening. No small bowel dilatation. The terminal ileum is normal. The appendix is not visualized, but there is no edema or inflammation in the region of the cecum. No gross colonic mass. No colonic wall thickening. Vascular/Lymphatic: There is abdominal aortic atherosclerosis without aneurysm. There is no gastrohepatic or hepatoduodenal ligament lymphadenopathy. No intraperitoneal or retroperitoneal lymphadenopathy. No pelvic sidewall lymphadenopathy. Reproductive: The uterus is unremarkable.  There is no adnexal mass. Other: No intraperitoneal free fluid. Musculoskeletal: No worrisome lytic or sclerotic osseous abnormality. IMPRESSION: 1. 2.6 x 2.3 cm spiculated mass identified inferomedial quadrant of the left breast. Lesion appears to retract the overlying skin. Mammographic correlation recommended. 2.  Small lymph nodes in the left axillar ill-defined and suspicious for metastatic disease. 3. Multiple bilateral pulmonary nodules measuring up to 2.7 cm diameter. These probably represent metastatic disease. Given the dominant size of the central right upper lobe lesion, synchronous lung primary can not be completely excluded. 4. Multiple ill-defined liver lesions compatible with metastatic disease. Dominant liver metastases measure up to almost 4 cm. 5.  Aortic Atherosclerois (ICD10-170.0) Electronically Signed   By: Misty Stanley M.D.   On: 03/11/2018 18:30   Mr Stacy Shelton OE Contrast  Result Date: 03/17/2018 CLINICAL DATA:  Secondary malignant neoplasm of brain. SRS treatment planning. EXAM: MRI HEAD WITHOUT AND WITH CONTRAST TECHNIQUE: Multiplanar, multiecho pulse sequences of the brain and surrounding structures were obtained without and with intravenous contrast. CONTRAST:  71mL MULTIHANCE GADOBENATE DIMEGLUMINE 529 MG/ML IV SOLN COMPARISON:  MRI head 03/12/2018 FINDINGS: Brain: Solitary metastatic deposit in the right occipital lobe measures 16 x 13 mm. This measures slightly larger attributed to thinner sections on the current study. Small enhancing nodule is present immediately deep to the largest lesion. The lesions show central necrosis and is peripherally located. Punctate foci of mineralization likely hemorrhage or calcification within the lesion. Mild adjacent white matter edema. No other enhancing mass lesion identified. Mild meningeal enhancement is less impressive compared to the prior study. Ventricle size normal. No midline shift. Negative for acute infarct. Mild chronic ischemic changes in the white matter. Vascular: Normal arterial flow voids Skull and upper cervical spine: Cervical spine bone marrow diffusely low signal intensity on T1. No focal lesions identified. Clivus also shows low signal bone marrow. Sinuses/Orbits: Normal orbit moderate mucosal edema paranasal sinuses none. Other:  Solitary metastatic deposit IMPRESSION: Right occipital lobe 13 x 16 mm. Small satellite enhancing nodule deep to the larger lesion. The larger lesion shows central necrosis and probable mild hemorrhage or calcification. Mild dural enhancement is less impressive and could be within normal limits. Bone marrow in the clivus and cervical spine is diffusely low signal. No focal lesion. This may be related to the patient's anemia and abnormal blood count. Electronically Signed   By: Franchot Gallo M.D.   On: 03/17/2018 09:08   Mr Stacy Shelton UM Contrast  Result Date: 03/12/2018 CLINICAL DATA:  Metastatic breast cancer.  Gait disturbance. EXAM: MRI HEAD WITHOUT AND WITH CONTRAST TECHNIQUE: Multiplanar, multiecho pulse sequences of the brain and surrounding structures were obtained without and with intravenous contrast. CONTRAST:  Gadavist 7 mL. COMPARISON:  None. FINDINGS: Brain: 13 x 14 x 13 mm metastasis, RIGHT lateral occipital lobe,  mild surrounding edema. No midline shift. No other definite metastatic lesions. No acute stroke, hemorrhage hydrocephalus. Slight premature for age white matter disease. Other than the necrotic metastasis, no abnormal enhancement of the brain. Borderline pachymeningeal enhancement of a symmetric nature, could be related to the superficial metastasis. No leptomeningeal enhancement. Vascular: Normal flow voids. Skull and upper cervical spine: Diffusely abnormal marrow in the clivus and upper cervical region, concerning for metastatic disease. Sinuses/Orbits: No significant sinus disease.  Negative orbits. Other: None. IMPRESSION: Single 1.3 x 1.4 cm RIGHT occipital metastasis. Borderline pachymeningeal enhancement of symmetric nature could be related to the superficial metastasis or osseous disease. A call has been placed to the ordering provider. Electronically Signed   By: Staci Righter M.D.   On: 03/12/2018 13:28   Ct Abdomen Pelvis W Contrast  Result Date: 04/08/2018 CLINICAL DATA:   58 year old female with metastatic breast cancer, fever, sepsis. EXAM: CT CHEST, ABDOMEN, AND PELVIS WITH CONTRAST TECHNIQUE: Multidetector CT imaging of the chest, abdomen and pelvis was performed following the standard protocol during bolus administration of intravenous contrast. CONTRAST:  156mL OMNIPAQUE IOHEXOL 300 MG/ML  SOLN COMPARISON:  Portable chest 04/07/2018. CT Chest, Abdomen, and Pelvis 03/11/2018. FINDINGS: CT CHEST FINDINGS Cardiovascular: No pericardial effusion. Stable cardiac size. Negative thoracic aorta. Central pulmonary arteries are patent. Right IJ chest porta cath in place. Mediastinum/Nodes: Small but progressed since February right paratracheal lymph nodes individually 9 millimeter short axis. No other mediastinal lymphadenopathy; postinflammatory coarsely calcified subcarinal and right hilar nodes are stable. Lungs/Pleura: New subtotal consolidation of the right upper and lower lobes with superimposed small layering right pleural effusion. This obscures some of the right lung nodule/metastases seen in February. Patchy discontinuous peribronchial opacity with some consolidation in the right middle lobe is new. Major airways are patent. New consolidation in the medial basal segment of the left lower lobe with additional new indistinct peribronchial ground-glass and nodules in the other left lower lobe segments. Similar nodularity in the lingula with some early consolidation. In the left upper lobe some of the formally circumscribed metastatic appearing nodules now are indistinct with marginal ground-glass opacity (series 4, image 47). This is also true of the 1 or 2 visible right lung nodules. Musculoskeletal: No acute or destructive osseous lesion identified. New subcutaneous edema along the left lateral chest wall. Left axillary lymphadenopathy has mildly regressed. CT ABDOMEN PELVIS FINDINGS Hepatobiliary: Mild hepatomegaly. Trace perihepatic free fluid. Multiple hypodense liver  metastases redemonstrated. The largest have not significantly changed in size or configuration. No definite new hepatic metastasis. Gallbladder remains within normal limits. Pancreas: Negative. Spleen: Stable mild splenic enlargement and small calcified granulomas. Adrenals/Urinary Tract: Negative adrenal glands. Bilateral renal enhancement and contrast excretion is symmetric and within normal limits. Benign left renal parapelvic cysts. Decompressed proximal ureters. Unremarkable urinary bladder. Stomach/Bowel: Sigmoid diverticulosis with no active inflammation. Mild motion artifact in the upper abdomen. No dilated or definitely inflamed large or small bowel loops. Decompressed stomach and duodenum. No free air. Trace perihepatic free fluid. Vascular/Lymphatic: Mild Aortoiliac calcified atherosclerosis. Major arterial structures are patent. Portal venous system seems patent. No lymphadenopathy. Reproductive: Stable and within normal limits. Other: No pelvic free fluid. Musculoskeletal: No acute or destructive osseous lesion identified. Mild bilateral subcutaneous flank edema. IMPRESSION: 1. Severe right lung multilobar pneumonia. Multifocal early pneumonia in the left lung. Associated small layering right pleural effusion. 2. Evolution of the pulmonary nodules/presumed pulmonary metastases visible today, with irregular and indistinct margins. This might reflect treatment affect. 3. Mildly enlarged right paratracheal  mediastinal nodes, favor reactive rather than metastatic. 4. Stable liver metastases. Mild hepatomegaly and trace perihepatic free fluid. 5. No new metastatic disease identified. Electronically Signed   By: Genevie Ann M.D.   On: 04/08/2018 19:32   Ct Abdomen Pelvis W Contrast  Result Date: 03/11/2018 CLINICAL DATA:  Weight loss in thrombocytopenia. EXAM: CT CHEST, ABDOMEN, AND PELVIS WITH CONTRAST TECHNIQUE: Multidetector CT imaging of the chest, abdomen and pelvis was performed following the standard  protocol during bolus administration of intravenous contrast. CONTRAST:  173mL OMNIPAQUE IOHEXOL 300 MG/ML SOLN, 68mL OMNIPAQUE IOHEXOL 300 MG/ML SOLN COMPARISON:  None. FINDINGS: CT CHEST FINDINGS Cardiovascular: The heart size is normal. No substantial pericardial effusion. Atherosclerotic calcification is noted in the wall of the thoracic aorta. Mediastinum/Nodes: 13 mm short axis precarinal lymph node mildly enlarged. 13 mm densely calcified subcarinal lymph node is associated with calcified nodal tissue in the right hilum. No left hilar lymphadenopathy. 9 mm short axis left axillary lymph node is associated with small additional left axillary and subpectoral nodes. No right hilar adenopathy. Lungs/Pleura: The central tracheobronchial airways are patent. 2.7 x 2.2 cm central right upper lobe nodule identified on 56/4. Numerous additional pulmonary nodules are identified bilaterally ranging in size from several mm up to 10 mm in the right upper lobe. These nodules have a slight upper lung predominance. Index 7 mm right lower lobe nodule identified on 64/4. No pleural effusion. Calcified granuloma noted posterior right costophrenic sulcus. Musculoskeletal: No worrisome lytic or sclerotic osseous abnormality. Masslike asymmetry of left breast tissue identified in the medial inferior quadrant of the left breast (40/2) measuring 2.6 x 2.3 cm. Coronal imaging suggests that this lesion has spiculated margins (22/5). CT ABDOMEN PELVIS FINDINGS Hepatobiliary: Multiple (approximately 10) ill-defined liver lesions are concerning for metastases. Dominant lesions are a 3.6 cm lesion in the right liver (54/2) and a 3.8 cm lesion identified lateral segment left liver (58/2). Other lesions range in size from 8 mm up to 2.6 cm. There is no evidence for gallstones, gallbladder wall thickening, or pericholecystic fluid. No intrahepatic or extrahepatic biliary dilation. Pancreas: No focal mass lesion. No dilatation of the main  duct. No intraparenchymal cyst. No peripancreatic edema. Spleen: Calcified granulomata. Adrenals/Urinary Tract: No adrenal nodule or mass. Kidneys unremarkable. Central sinus cysts noted in the left kidney. No evidence for hydroureter. The urinary bladder appears normal for the degree of distention. Stomach/Bowel: Stomach is unremarkable. No gastric wall thickening. No evidence of outlet obstruction. Duodenum is normally positioned as is the ligament of Treitz. No small bowel wall thickening. No small bowel dilatation. The terminal ileum is normal. The appendix is not visualized, but there is no edema or inflammation in the region of the cecum. No gross colonic mass. No colonic wall thickening. Vascular/Lymphatic: There is abdominal aortic atherosclerosis without aneurysm. There is no gastrohepatic or hepatoduodenal ligament lymphadenopathy. No intraperitoneal or retroperitoneal lymphadenopathy. No pelvic sidewall lymphadenopathy. Reproductive: The uterus is unremarkable.  There is no adnexal mass. Other: No intraperitoneal free fluid. Musculoskeletal: No worrisome lytic or sclerotic osseous abnormality. IMPRESSION: 1. 2.6 x 2.3 cm spiculated mass identified inferomedial quadrant of the left breast. Lesion appears to retract the overlying skin. Mammographic correlation recommended. 2. Small lymph nodes in the left axillar ill-defined and suspicious for metastatic disease. 3. Multiple bilateral pulmonary nodules measuring up to 2.7 cm diameter. These probably represent metastatic disease. Given the dominant size of the central right upper lobe lesion, synchronous lung primary can not be completely excluded. 4. Multiple ill-defined  liver lesions compatible with metastatic disease. Dominant liver metastases measure up to almost 4 cm. 5.  Aortic Atherosclerois (ICD10-170.0) Electronically Signed   By: Misty Stanley M.D.   On: 03/11/2018 18:30   US Biopsy (liver)  Result Date: 03/12/2018 INDICATION: Multiple liver  masses, lung lesions and left breast mass. The patient presents for biopsy of a liver lesion. EXAM: ULTRASOUND GUIDED CORE BIOPSY OF LIVER MEDICATIONS: None. ANESTHESIA/SEDATION: Fentanyl 100 mcg IV; Versed 2.0 mg IV Moderate Sedation Time:  20 minutes. The patient was continuously monitored during the procedure by the interventional radiology nurse under my direct supervision. PROCEDURE: The procedure, risks, benefits, and alternatives were explained to the patient. Questions regarding the procedure were encouraged and answered. The patient understands and consents to the procedure. A time-out was performed prior to initiating the procedure. Initial ultrasound was performed to localize liver lesions. The epigastric region was prepped with chlorhexidine in a sterile fashion, and a sterile drape was applied covering the operative field. A sterile gown and sterile gloves were used for the procedure. Local anesthesia was provided with 1% Lidocaine. Under ultrasound guidance, a 17 gauge trocar needle was advanced into a lesion within the left lobe of the liver. After confirming needle tip position, 3 coaxial 18 gauge core biopsy samples were obtained. Material was submitted in formalin. After the procedure Gel-Foam pledgets were advanced through the outer needle as the needle was retracted. COMPLICATIONS: None immediate. 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 The largest and most accessible by ultrasound was within the lateral segment of the left lobe and measures just over 3.5 cm in estimated maximal diameter. Two intact solid core biopsy samples were obtained. The third throw of the biopsy device did not yield much tissue. There were no immediate bleeding complications. IMPRESSION: Ultrasound-guided core biopsy performed of a mass within the lateral segment of the left lobe of the liver. Electronically Signed   By: Aletta Edouard M.D.   On: 03/12/2018 16:43   Dg Chest Port 1  View  Result Date: 04/07/2018 CLINICAL DATA:  Breast cancer with metastases.  Fever. EXAM: PORTABLE CHEST 1 VIEW COMPARISON:  CT scan March 11, 2018 FINDINGS: There is an oval hazy opacity in the right upper lobe which correlates with the site of a previously identified nodule, likely a metastatic lesion. No other definitive pulmonary nodules. The heart, hila, and mediastinum are unremarkable. No pneumothorax. The right Port-A-Cath terminates in the SVC. No other acute abnormalities. IMPRESSION: 1. The ill-defined hazy rounded opacity in the right upper lobe correlates with a previously identified nodule thought to be a metastatic lesion. No other abnormalities identified on today's study. Electronically Signed   By: Dorise Bullion III M.D   On: 04/07/2018 16:54   Ir Imaging Guided Port Insertion  Result Date: 03/24/2018 INDICATION: 58 year old female with metastatic breast cancer. She presents for placement of a portacatheter to provide durable venous access for palliative chemotherapy. EXAM: IMPLANTED PORT A CATH PLACEMENT WITH ULTRASOUND AND FLUOROSCOPIC GUIDANCE MEDICATIONS: 2 g Ancef; The antibiotic was administered within an appropriate time interval prior to skin puncture. ANESTHESIA/SEDATION: Versed 2 mg IV; Fentanyl 180 mcg IV; Moderate Sedation Time:  24 minutes The patient was continuously monitored during the procedure by the interventional radiology nurse under my direct supervision. FLUOROSCOPY TIME:  1 minutes, 0 seconds (3 mGy) COMPLICATIONS: None immediate. PROCEDURE: The right neck and chest was prepped with chlorhexidine, and draped in the usual sterile fashion using maximum barrier technique (cap and  mask, sterile gown, sterile gloves, large sterile sheet, hand hygiene and cutaneous antiseptic). Local anesthesia was attained by infiltration with 1% lidocaine with epinephrine. Ultrasound demonstrated patency of the right internal jugular vein, and this was documented with an image. Under  real-time ultrasound guidance, this vein was accessed with a 21 gauge micropuncture needle and image documentation was performed. A small dermatotomy was made at the access site with an 11 scalpel. A 0.018" wire was advanced into the SVC and the access needle exchanged for a 89F micropuncture vascular sheath. The 0.018" wire was then removed and a 0.035" wire advanced into the IVC. An appropriate location for the subcutaneous reservoir was selected below the clavicle and an incision was made through the skin and underlying soft tissues. The subcutaneous tissues were then dissected using a combination of blunt and sharp surgical technique and a pocket was formed. A single lumen power injectable portacatheter was then tunneled through the subcutaneous tissues from the pocket to the dermatotomy and the port reservoir placed within the subcutaneous pocket. The venous access site was then serially dilated and a peel away vascular sheath placed over the wire. The wire was removed and the port catheter advanced into position under fluoroscopic guidance. The catheter tip is positioned in the superior cavoatrial junction. This was documented with a spot image. The portacatheter was then tested and found to flush and aspirate well. The port was flushed with saline followed by 100 units/mL heparinized saline. The pocket was then closed in two layers using first subdermal inverted interrupted absorbable sutures followed by a running subcuticular suture. The epidermis was then sealed with Dermabond. The dermatotomy at the venous access site was also closed with Dermabond. IMPRESSION: Successful placement of a right IJ approach Power Port with ultrasound and fluoroscopic guidance. The catheter is ready for use. Signed, Criselda Peaches, MD, [facility] Rad[facility] Electronically Signed   By: Jacqulynn Cadet M.D.   On: 03/24/2018 09:29    EKG     EKG: EKG was personally  reviewed and demonstrates: Sinus tachycardia, rate 128 bpm, with no acute ischemic changes.   Telemetry: Telemetry was personally reviewed and demonstrates: Sinus tachycardia with rates ranging in the low's to 160's. Rates currently in the 120's.  Cardiac Imaging    Echocardiogram 03/21/2018: Impressions:  1. The left ventricle has normal systolic function with an ejection fraction of 60-65%. The cavity size was normal. Left ventricular diastolic Doppler parameters are consistent with impaired relaxation.  2. The right ventricle has normal systolic function. The cavity was normal. There is no increase in right ventricular wall thickness.  3. The mitral valve is normal in structure.  4. The tricuspid valve is normal in structure.  5. Strain imaging performed but not reported due to interpreter judgement, secondary to suboptimal image quality.  Summary: If strain imaging is clinically indicated, consider repeat focused exam limited for strain.  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 She presented with fever, nausea, and diarrhea and was admitted for severe sepsis. Found to have UTI and pneumonia. Also found to have a hemoglobin of 5.7 and received 2 units of PRBCs. Cardiology was consulted for tachycardia.  - Telemetry shows sinus tachycardia with rates ranging from the 100's to 160's. No arrhythmias noted. Patient was started on Cardizem bolus and drip yesterday with some improvement but still tachycardic. Current rates in the 120's.  - Recent Echo on  03/21/2018 shows LVEF of 60-65% with some impaired relaxation.  - Continue IV Cardizem. - Discussed possibly checking D-dimer to rule out pulmonary embolism given tachycardia and shortness of breath (though her pneumonia could certainly explain these symptoms). She also has multiple reasons she could have an elevated D-dimer including malignancy and sepsis. Will discuss with MD. -  Potassium 2.7 on admission. Repleted and 3.7 today. Goal >4.0. Replete as needed. - Magnesium 1.3 on admission. Repleted and 1.8 yesterday. Goal >2.0. Replete as needed. - TSH normal. - Suspect this is all physiologic tachycardia in the setting of acute illness. Suspect the heart rate to improve as underlying illnesses are treated.  Hypotension - Patient was initially hypotensive requiring pressors. Neosynephrine started in the ED but it looks like this was discontinued yesterday. - BP has improved. Most recent BP 138/51.   Sepsis - Continues to be febrile this morning with temp of 101. WBC 3.8.  - Currently on antibiotics. - Management per primary team.   Pneumonia - CT scan showed severe right lung multilobar pneumonia and multifocal early pneumonia in the left lung. - Management per primary team.  Pancytopenia - Hemoglobin 5.7 on admission. Received 2 units of PRBCs.  - Today's counts: WBC 3.8, Hgb 8.3, and Plts 66.   Otherwise, per primary team and Oncology.  Signed, Darreld Mclean, PA-C 04/09/2018, 6:53 AM  For questions or updates, please contact   Please consult [email or web address] for contact info under Cardiology/STEMI. ---------------------------------------------------------------------------------------------   History and all data above reviewed.  Patient examined.  I agree with the findings as above.  ETOSHA WETHERELL appears comfortable with BiPAP in place and easily responds to questions appropriately.  She presents with pneumonia and sinus tachycardia, for which we were consulted to evaluate.  Her rates are better controlled at this time likely with therapy for her underlying medical conditions.  She was placed on Cardizem however she has not demonstrated any signs of atrial arrhythmia driving her tachycardia.  Constitutional: No acute distress Cardiovascular: regular rhythm, tachycardic rate, no murmurs. S1 and S2 normal. Radial pulses normal bilaterally. No jugular  venous distention.  Respiratory: Diminished breath sounds in the right lung from mid lung field to base, diminished breath sounds in the left lung base.  BiPAP in place. GI : normal bowel sounds, soft and nontender. No distention.   MSK: extremities warm, well perfused. No edema.  NEURO: grossly nonfocal exam, moves all extremities. PSYCH: alert and oriented x 3, normal mood and affect.   All available labs, radiology testing, previous records reviewed. Agree with documented assessment and plan of my colleague as stated above with the following additions or changes:  Active Problems:   Absolute anemia   Metastatic breast cancer (HCC)   Diarrhea   Sepsis (HCC)   Hypokalemia   Hypomagnesemia   Acute lower UTI   Hypophosphatemia   Septic shock (HCC)   Malnutrition of moderate degree    Plan: I reviewed her ECG and telemetry.  I have also independently reviewed the images from her CT chest.  She is a significant consolidation on CT concerning for pneumonia, and this is likely the driver of her sinus tachycardia.  At this time I would not recommend rate control for sinus tachycardia, as this is driven by her underlying infection, anemia, and in part contributed to by anxiety with BiPAP.  Recommend continuing supportive measures for these conditions.  Recommend discontinuing Cardizem.  A recent echocardiogram was performed and showed a preserved ejection fraction with grade  1 diastolic dysfunction.  Strain was unable to be interpreted due to suboptimal image quality.  Cardiology will sign off, however do not hesitate to contact us if we can be of assistance during the patient's hospital stay.  Length of Stay:  LOS: 1 day   Elouise Munroe, MD [facility] 12:21 PM  04/09/2018

## 2018-04-09 NOTE — Progress Notes (Signed)
These preliminary result these preliminary results were noted.  Awaiting final report.

## 2018-04-09 NOTE — Progress Notes (Signed)
RN aware of continued tachypnea 40-50/min. Maintains sat on 100% NRB. States MD aware of tenuous status and this is evident in notes. RN states they will notify RT and try BIPAP.

## 2018-04-09 NOTE — Progress Notes (Signed)
PCCM X-COVER  Called for tachypnea  Patient was placed back on NIV prior to my arrival She was quietly tachypneic and able to converse through the mask The patient explains she was fine off BiPAP and denies distress   A/P Acute Hypoxemic Resp Failure  The patient is comfortable enough now from standpoint of WOB She would put off intubation until last chance, and is not expected to electively choose mechanical ventilation  CXR reveals increased infiltrate on the right, despite penetration hotter on this film  Support with NIV and current care plan at this time    //Skyylar Kopf

## 2018-04-10 ENCOUNTER — Encounter (HOSPITAL_COMMUNITY): Payer: Self-pay | Admitting: *Deleted

## 2018-04-10 DIAGNOSIS — N390 Urinary tract infection, site not specified: Secondary | ICD-10-CM

## 2018-04-10 DIAGNOSIS — N3000 Acute cystitis without hematuria: Secondary | ICD-10-CM

## 2018-04-10 DIAGNOSIS — J189 Pneumonia, unspecified organism: Secondary | ICD-10-CM

## 2018-04-10 DIAGNOSIS — B961 Klebsiella pneumoniae [K. pneumoniae] as the cause of diseases classified elsewhere: Secondary | ICD-10-CM

## 2018-04-10 DIAGNOSIS — J181 Lobar pneumonia, unspecified organism: Secondary | ICD-10-CM

## 2018-04-10 LAB — CBC
HCT: 22.6 % — ABNORMAL LOW (ref 36.0–46.0)
Hemoglobin: 7.1 g/dL — ABNORMAL LOW (ref 12.0–15.0)
MCH: 26.8 pg (ref 26.0–34.0)
MCHC: 31.4 g/dL (ref 30.0–36.0)
MCV: 85.3 fL (ref 80.0–100.0)
Platelets: 58 10*3/uL — ABNORMAL LOW (ref 150–400)
RBC: 2.65 MIL/uL — ABNORMAL LOW (ref 3.87–5.11)
RDW: 21.9 % — ABNORMAL HIGH (ref 11.5–15.5)
WBC: 6.6 10*3/uL (ref 4.0–10.5)
nRBC: 1.1 % — ABNORMAL HIGH (ref 0.0–0.2)

## 2018-04-10 LAB — COMPREHENSIVE METABOLIC PANEL
ALT: 17 U/L (ref 0–44)
AST: 20 U/L (ref 15–41)
Albumin: 2.4 g/dL — ABNORMAL LOW (ref 3.5–5.0)
Alkaline Phosphatase: 50 U/L (ref 38–126)
Anion gap: 6 (ref 5–15)
BUN: 16 mg/dL (ref 6–20)
CHLORIDE: 110 mmol/L (ref 98–111)
CO2: 27 mmol/L (ref 22–32)
Calcium: 7.8 mg/dL — ABNORMAL LOW (ref 8.9–10.3)
Creatinine, Ser: 0.67 mg/dL (ref 0.44–1.00)
GFR calc Af Amer: >60 mL/min (ref >60)
GFR calc non Af Amer: >60 mL/min (ref >60)
Glucose, Bld: 109 mg/dL — ABNORMAL HIGH (ref 70–99)
Potassium: 2.9 mmol/L — ABNORMAL LOW (ref 3.5–5.1)
Sodium: 143 mmol/L (ref 135–145)
Total Bilirubin: 5.5 mg/dL — ABNORMAL HIGH (ref 0.3–1.2)
Total Protein: 5.9 g/dL — ABNORMAL LOW (ref 6.5–8.1)

## 2018-04-10 LAB — GLUCOSE, CAPILLARY
Glucose-Capillary: 103 mg/dL — ABNORMAL HIGH (ref 70–99)
Glucose-Capillary: 126 mg/dL — ABNORMAL HIGH (ref 70–99)
Glucose-Capillary: 171 mg/dL — ABNORMAL HIGH (ref 70–99)
Glucose-Capillary: 178 mg/dL — ABNORMAL HIGH (ref 70–99)
Glucose-Capillary: 192 mg/dL — ABNORMAL HIGH (ref 70–99)
Glucose-Capillary: 87 mg/dL (ref 70–99)

## 2018-04-10 LAB — URINE CULTURE: Culture: >=100000 — AB

## 2018-04-10 LAB — PROTIME-INR
INR: 1.6 — ABNORMAL HIGH (ref 0.8–1.2)
Prothrombin Time: 18.5 seconds — ABNORMAL HIGH (ref 11.4–15.2)

## 2018-04-10 LAB — PROCALCITONIN: Procalcitonin: 46.84 ng/mL

## 2018-04-10 LAB — PREPARE RBC (CROSSMATCH)

## 2018-04-10 LAB — MAGNESIUM: Magnesium: 2.1 mg/dL (ref 1.7–2.4)

## 2018-04-10 LAB — CK TOTAL AND CKMB (NOT AT ARMC)
CK, MB: 0.9 ng/mL (ref 0.5–5.0)
RELATIVE INDEX: INVALID (ref 0.0–2.5)
Total CK: 11 U/L — ABNORMAL LOW (ref 38–234)

## 2018-04-10 MED ORDER — SALINE SPRAY 0.65 % NA SOLN
1.0000 | NASAL | Status: DC | PRN
  Filled 2018-04-10: qty 44

## 2018-04-10 MED ORDER — DIPHENHYDRAMINE HCL 25 MG PO CAPS
25.0000 mg | ORAL_CAPSULE | Freq: Once | ORAL | Status: AC
  Administered 2018-04-10: 25 mg via ORAL
  Filled 2018-04-10: qty 1

## 2018-04-10 MED ORDER — ACETAMINOPHEN 325 MG PO TABS
650.0000 mg | ORAL_TABLET | Freq: Once | ORAL | Status: AC
  Administered 2018-04-10: 650 mg via ORAL
  Filled 2018-04-10: qty 2

## 2018-04-10 MED ORDER — SODIUM CHLORIDE 0.9% IV SOLUTION
Freq: Once | INTRAVENOUS | Status: AC
  Administered 2018-04-11: 08:00:00 via INTRAVENOUS

## 2018-04-10 MED ORDER — POTASSIUM CHLORIDE CRYS ER 20 MEQ PO TBCR
40.0000 meq | EXTENDED_RELEASE_TABLET | Freq: Once | ORAL | Status: AC
  Administered 2018-04-10: 40 meq via ORAL
  Filled 2018-04-10: qty 2

## 2018-04-10 MED ORDER — FUROSEMIDE 10 MG/ML IJ SOLN
40.0000 mg | Freq: Four times a day (QID) | INTRAMUSCULAR | Status: AC
  Administered 2018-04-10: 40 mg via INTRAVENOUS
  Filled 2018-04-10: qty 4

## 2018-04-10 NOTE — Progress Notes (Signed)
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 Continue aggressive supportive care CT imaging shows stable disease. She is due for cycle 2 of chemotherapy next week.  It is unlikely she will recover to the point of resuming chemotherapy yet. Due to stability of her disease, I reassured the patient and family that we need to focus on supportive care first before discussion about resumption of treatment.  Recurrent fever, respiratory distress CT imaging confirmed bilateral pneumonia Cultures are pending. Preliminary diagnosis is likely related to sepsis. She is currently on broad-spectrum IV antibiotics.  I have a long discussion with the patient and family members the risk and benefits of aggressive ventilatory support if needed Even though she has stage IV metastatic breast cancer, her prognosis is still good with aggressive treatment If she needs to be intubated due to respiratory failure, I will support that decision.  I would defer the plan for respiratory support to the pulmonary and critical care team  Progressive pancytopenia Likely due to bone marrow suppression from recent chemotherapy, on the background history of bone metastasis.  She also have signs of mild hemolysis, due to malignancy and chemotherapy I recommend transfusion support to keep hemoglobin greater than 8.  She does not need platelet transfusion unless platelet count falls to less than 10,000 or signs of bleeding We discussed some of the risks, benefits, and alternatives of blood transfusions. The patient is symptomatic from anemia and the hemoglobin level is critically low.  Some of the side-effects to be expected including risks of transfusion reactions, chills, infection, syndrome of volume overload and risk of hospitalization from various reasons and the patient is willing to  proceed. I have ordered 1 unit of irradiated blood transfusion today  Poorly controlled diabetes She will continue insulin treatment  Multiple electrolyte imbalance Replace as needed  Progressive jaundice The elevated bilirubin could be due to cholestasis or sepsis CT imaging did not show biliary obstruction Hemolysis from recent transfuse blood is also a possibility Observe only Adjust medication as needed  CODE STATUS Full code  Discharge planning Anticipate she will be in the hospital for the next few days I have significant discussion with the patient and family members about the plan of care and the role of multiple team members.  I recommend aggressive supportive measures as needed including the possibility of CPR and intubation in this hospital stay I will stop by again this evening to check on her I have addressed all her questions and concerns  [name], MD 04/10/2018  10:07 AM   Subjective:  I saw the patient and interacted with the ICU staff I also discussed the plan of care with her sister, mother and best friend The patient was able to get off BiPAP.  She is currently on high flow oxygen.  Her vital signs are stable so far.  She is able to speak short sentences.  She complain of dry mouth.  She denies diarrhea. The patient denies any recent signs or symptoms of bleeding such as spontaneous epistaxis, hematuria or hematochezia. She has been afebrile over the last few hours.  Objective:  Vitals:   04/10/18 0500 04/10/18 0600  BP: 124/65 (!) 131/52  Pulse: (!) 103 (!) 107  Resp: (!) 29 (!) 27  Temp:    SpO2: 100% 100%     Intake/Output Summary (Last 24 hours)  at 04/10/2018 1007 Last data filed at 04/10/2018 0400 Gross per 24 hour  Intake 796.59 ml  Output 1800 ml  Net -1003.41 ml    GENERAL:alert, no distress and comfortable.  She have high flow oxygen in situ SKIN: She looks pale and mildly jaundiced EYES: normal, Conjunctiva are pink and  non-injected, sclera clear OROPHARYNX: Dry mucous membrane is noted NECK: supple, thyroid normal size, non-tender, without nodularity LYMPH:  no palpable lymphadenopathy in the cervical, axillary or inguinal LUNGS: Increased breathing effort.  Reduced breath sounds on both lung bases, right worse than the left HEART: Tachycardia, no murmurs and no lower extremity edema ABDOMEN:abdomen soft, non-tender and normal bowel sounds Musculoskeletal:no cyanosis of digits and no clubbing  NEURO: alert & oriented x 3 with fluent speech, no focal motor/sensory deficits   Labs:  Lab Results  Component Value Date   WBC 6.6 04/10/2018   HGB 7.1 (L) 04/10/2018   HCT 22.6 (L) 04/10/2018   MCV 85.3 04/10/2018   PLT 58 (L) 04/10/2018   NEUTROABS 2.4 04/09/2018    Lab Results  Component Value Date   NA 143 04/10/2018   K 2.9 (L) 04/10/2018   CL 110 04/10/2018   CO2 27 04/10/2018    Studies:  Ct Head Wo Contrast  Result Date: 04/08/2018 CLINICAL DATA:  Sepsis with known metastatic breast cancer EXAM: CT HEAD WITHOUT CONTRAST TECHNIQUE: Contiguous axial images were obtained from the base of the skull through the vertex without intravenous contrast. COMPARISON:  Brain MRI 03/16/2018 FINDINGS: Brain: There is focal hypoattenuation in the right occipital lobe, corresponding to the site of the known metastatic lesion. No acute hemorrhage or other extra-axial collection. No midline shift or significant mass effect. Normal size of the ventricles. Vascular: No abnormal hyperdensity of the major intracranial arteries or dural venous sinuses. No intracranial atherosclerosis. Skull: The visualized skull base, calvarium and extracranial soft tissues are normal. Sinuses/Orbits: Postsurgical changes of the maxillary and ethmoid sinuses. No mastoid or middle ear effusion. The orbits are normal. IMPRESSION: 1. Hypoattenuation in the right occipital lobe, corresponding to known metastatic lesion. 2. No acute hemorrhage.  Electronically Signed   By: Ulyses Jarred M.D.   On: 04/08/2018 19:18   Ct Chest W Contrast  Result Date: 04/08/2018 CLINICAL DATA:  58 year old female with metastatic breast cancer, fever, sepsis. EXAM: CT CHEST, ABDOMEN, AND PELVIS WITH CONTRAST TECHNIQUE: Multidetector CT imaging of the chest, abdomen and pelvis was performed following the standard protocol during bolus administration of intravenous contrast. CONTRAST:  137mL OMNIPAQUE IOHEXOL 300 MG/ML  SOLN COMPARISON:  Portable chest 04/07/2018. CT Chest, Abdomen, and Pelvis 03/11/2018. FINDINGS: CT CHEST FINDINGS Cardiovascular: No pericardial effusion. Stable cardiac size. Negative thoracic aorta. Central pulmonary arteries are patent. Right IJ chest porta cath in place. Mediastinum/Nodes: Small but progressed since February right paratracheal lymph nodes individually 9 millimeter short axis. No other mediastinal lymphadenopathy; postinflammatory coarsely calcified subcarinal and right hilar nodes are stable. Lungs/Pleura: New subtotal consolidation of the right upper and lower lobes with superimposed small layering right pleural effusion. This obscures some of the right lung nodule/metastases seen in February. Patchy discontinuous peribronchial opacity with some consolidation in the right middle lobe is new. Major airways are patent. New consolidation in the medial basal segment of the left lower lobe with additional new indistinct peribronchial ground-glass and nodules in the other left lower lobe segments. Similar nodularity in the lingula with some early consolidation. In the left upper lobe some of the formally circumscribed metastatic appearing nodules  now are indistinct with marginal ground-glass opacity (series 4, image 47). This is also true of the 1 or 2 visible right lung nodules. Musculoskeletal: No acute or destructive osseous lesion identified. New subcutaneous edema along the left lateral chest wall. Left axillary lymphadenopathy has  mildly regressed. CT ABDOMEN PELVIS FINDINGS Hepatobiliary: Mild hepatomegaly. Trace perihepatic free fluid. Multiple hypodense liver metastases redemonstrated. The largest have not significantly changed in size or configuration. No definite new hepatic metastasis. Gallbladder remains within normal limits. Pancreas: Negative. Spleen: Stable mild splenic enlargement and small calcified granulomas. Adrenals/Urinary Tract: Negative adrenal glands. Bilateral renal enhancement and contrast excretion is symmetric and within normal limits. Benign left renal parapelvic cysts. Decompressed proximal ureters. Unremarkable urinary bladder. Stomach/Bowel: Sigmoid diverticulosis with no active inflammation. Mild motion artifact in the upper abdomen. No dilated or definitely inflamed large or small bowel loops. Decompressed stomach and duodenum. No free air. Trace perihepatic free fluid. Vascular/Lymphatic: Mild Aortoiliac calcified atherosclerosis. Major arterial structures are patent. Portal venous system seems patent. No lymphadenopathy. Reproductive: Stable and within normal limits. Other: No pelvic free fluid. Musculoskeletal: No acute or destructive osseous lesion identified. Mild bilateral subcutaneous flank edema. IMPRESSION: 1. Severe right lung multilobar pneumonia. Multifocal early pneumonia in the left lung. Associated small layering right pleural effusion. 2. Evolution of the pulmonary nodules/presumed pulmonary metastases visible today, with irregular and indistinct margins. This might reflect treatment affect. 3. Mildly enlarged right paratracheal mediastinal nodes, favor reactive rather than metastatic. 4. Stable liver metastases. Mild hepatomegaly and trace perihepatic free fluid. 5. No new metastatic disease identified. Electronically Signed   By: Genevie Ann M.D.   On: 04/08/2018 19:32   Ct Abdomen Pelvis W Contrast  Result Date: 04/08/2018 CLINICAL DATA:  58 year old female with metastatic breast cancer, fever,  sepsis. EXAM: CT CHEST, ABDOMEN, AND PELVIS WITH CONTRAST TECHNIQUE: Multidetector CT imaging of the chest, abdomen and pelvis was performed following the standard protocol during bolus administration of intravenous contrast. CONTRAST:  167mL OMNIPAQUE IOHEXOL 300 MG/ML  SOLN COMPARISON:  Portable chest 04/07/2018. CT Chest, Abdomen, and Pelvis 03/11/2018. FINDINGS: CT CHEST FINDINGS Cardiovascular: No pericardial effusion. Stable cardiac size. Negative thoracic aorta. Central pulmonary arteries are patent. Right IJ chest porta cath in place. Mediastinum/Nodes: Small but progressed since February right paratracheal lymph nodes individually 9 millimeter short axis. No other mediastinal lymphadenopathy; postinflammatory coarsely calcified subcarinal and right hilar nodes are stable. Lungs/Pleura: New subtotal consolidation of the right upper and lower lobes with superimposed small layering right pleural effusion. This obscures some of the right lung nodule/metastases seen in February. Patchy discontinuous peribronchial opacity with some consolidation in the right middle lobe is new. Major airways are patent. New consolidation in the medial basal segment of the left lower lobe with additional new indistinct peribronchial ground-glass and nodules in the other left lower lobe segments. Similar nodularity in the lingula with some early consolidation. In the left upper lobe some of the formally circumscribed metastatic appearing nodules now are indistinct with marginal ground-glass opacity (series 4, image 47). This is also true of the 1 or 2 visible right lung nodules. Musculoskeletal: No acute or destructive osseous lesion identified. New subcutaneous edema along the left lateral chest wall. Left axillary lymphadenopathy has mildly regressed. CT ABDOMEN PELVIS FINDINGS Hepatobiliary: Mild hepatomegaly. Trace perihepatic free fluid. Multiple hypodense liver metastases redemonstrated. The largest have not significantly  changed in size or configuration. No definite new hepatic metastasis. Gallbladder remains within normal limits. Pancreas: Negative. Spleen: Stable mild splenic enlargement and small calcified  granulomas. Adrenals/Urinary Tract: Negative adrenal glands. Bilateral renal enhancement and contrast excretion is symmetric and within normal limits. Benign left renal parapelvic cysts. Decompressed proximal ureters. Unremarkable urinary bladder. Stomach/Bowel: Sigmoid diverticulosis with no active inflammation. Mild motion artifact in the upper abdomen. No dilated or definitely inflamed large or small bowel loops. Decompressed stomach and duodenum. No free air. Trace perihepatic free fluid. Vascular/Lymphatic: Mild Aortoiliac calcified atherosclerosis. Major arterial structures are patent. Portal venous system seems patent. No lymphadenopathy. Reproductive: Stable and within normal limits. Other: No pelvic free fluid. Musculoskeletal: No acute or destructive osseous lesion identified. Mild bilateral subcutaneous flank edema. IMPRESSION: 1. Severe right lung multilobar pneumonia. Multifocal early pneumonia in the left lung. Associated small layering right pleural effusion. 2. Evolution of the pulmonary nodules/presumed pulmonary metastases visible today, with irregular and indistinct margins. This might reflect treatment affect. 3. Mildly enlarged right paratracheal mediastinal nodes, favor reactive rather than metastatic. 4. Stable liver metastases. Mild hepatomegaly and trace perihepatic free fluid. 5. No new metastatic disease identified. Electronically Signed   By: Genevie Ann M.D.   On: 04/08/2018 19:32   Dg Chest Port 1 View  Result Date: 04/09/2018 CLINICAL DATA:  Increasing shortness of breath EXAM: PORTABLE CHEST 1 VIEW COMPARISON:  04/07/2018 FINDINGS: Progressing consolidation and volume loss in the right upper lung since prior study. This likely represents progression of pneumonia. A centrally obstructing lesion is  not excluded and follow-up after resolution is recommended. Calcified granulomas in the right lung and right hilum. Left lung is clear. Heart size and pulmonary vascularity are normal. No blunting of costophrenic angles. No pneumothorax. Power port type central venous catheter with tip over the low SVC region. IMPRESSION: Progressing consolidation and volume loss in the right upper lung since prior study. Electronically Signed   By: Lucienne Capers M.D.   On: 04/09/2018 20:43

## 2018-04-10 NOTE — Progress Notes (Addendum)
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   In the ED blood pressuresdid not respond to aggressive IV fluid hydration and was started on pressors. Blood cultures weredrawn andshewas started on broad-spectrum IV antibiotics. Transferedto stepdown unit for pressor support   Assessment & Plan:   Active Problems:   Absolute anemia   Metastatic breast cancer (HCC)   Diarrhea   Sepsis (HCC)   Hypokalemia   Hypomagnesemia   Acute lower UTI   Hypophosphatemia   Septic shock (HCC)   Malnutrition of moderate degree   Acute respiratory failure with hypoxia ([place])  #1 sepsis/ septic shock resolved patient off of pressors 04/08/2018.  CT scan of the chest show severe right lung multilobar pneumonia, multifocal early pneumonia in the left lung with associated small layering right pleural effusion.  Flagyl has been stopped as C. difficile was negative.  Cefepime has been changed to meropenem.  Vanco being continued.   Urine culture growing Klebsiella pneumonia sensitive to meropenem.  #2 respiratory distress patient was on BiPAP earlier when I saw her she appeared comfortable than yesterday.  She was able to speak words.  Family was present in the room.  She wants intubation if needed to survive through this.clear liquid diet when off bipap.  #3 pancytopenia probably secondary to recent chemo versus sepsis.  Hemoglobin this morning is 7.1. Will transfuse to keep hemoglobin above 8.  #4 diastolic CHF stable  #5 type 2 diabetes uncontrolled hemoglobin A1c 12.9.   Continue Levemir insulin 15 units daily.  Her blood glucose has been stable here.  #6 metastatic breast cancer with mets to liver brain and bone.s/post recent chemo.  #7 sinus tachycardia secondary to #1 #2 patient started on Cardizem last night.     Nutrition Problem: Moderate Malnutrition Etiology: chronic illness, cancer and cancer related treatments     Signs/Symptoms: mild fat depletion, mild muscle depletion, percent weight loss Percent weight loss: 11 %    Interventions: Boost Breeze, MVI  Estimated body mass index is 27.07 kg/m as calculated from the following:   Height as of this encounter: 5\' 2"  (1.575 m).   Weight as of this encounter: 67.1 kg.  DVT prophylaxis: SCD Code Status: Full code  family Communication: Discussed with sister and mother who was at the bedside Disposition Plan: Pending clinical improvement  Consultants:   PCCM infectious disease cardiology oncology  Procedures: None Antimicrobials: Vanco meropenem  Subjective: Patient resting in bed with BiPAP on family by the bedside she appears comfortable speaking few words.  Denies any diarrhea today she had one small loose bowel movement yesterday she has not had any bowel movement after that.  No nausea vomiting.   Objective: Vitals:   04/10/18 1100 04/10/18 1200 04/10/18 1205 04/10/18 1244  BP:  (!) 123/48  (!) 123/48  Pulse: (!) 105 (!) 112  (!) 115  Resp: (!) 39 (!) 50  (!) 33  Temp:   98.4 F (36.9 C) 98.8 F (37.1 C)  TempSrc:   Oral Oral  SpO2: 100% 99%  100%  Weight:      Height:        Intake/Output Summary (  Last 24 hours) at 04/10/2018 1300 Last data filed at 04/10/2018 1244 Gross per 24 hour  Intake 984.25 ml  Output 1800 ml  Net -815.75 ml   Filed Weights   04/07/18 1614  Weight: 67.1 kg    Examination:  General exam: Appears calm and comfortable  Respiratory system: Clearer than yesterday  to auscultation. Respiratory effort normal. Cardiovascular system:  S1 & S2 heard, RRR. No JVD, murmurs, rubs, gallops or clicks. No pedal edema. Gastrointestinal system: Abdomen is nondistended, soft and nontender. No organomegaly or masses felt. Normal bowel sounds heard. Central nervous system: Alert and oriented. No focal neurological deficits. Extremities: Symmetric 5 x 5 power. Skin: No rashes, lesions or ulcers    Data Reviewed: I have personally reviewed following labs and imaging studies  CBC: Recent Labs  Lab 04/07/18 1141 04/07/18 2042 04/08/18 0216 04/08/18 1626 04/09/18 0422 04/10/18 0552  WBC 11.1* 7.5 9.1 3.7* 3.8* 6.6  NEUTROABS 4.2 3.6  --  1.5* 2.4  --   HGB 7.2* 5.7* 7.6* 8.1* 8.3* 7.1*  HCT 22.9* 18.4* 24.0* 26.3* 26.4* 22.6*  MCV 83.3 86.0 85.7 87.1 84.9 85.3  PLT 151 109* 107* 74* 66* 58*   Basic Metabolic Panel: Recent Labs  Lab 04/07/18 1143 04/07/18 2042 04/08/18 0216 04/08/18 1626 04/09/18 0422 04/09/18 1102 04/10/18 0552  NA 132* 135   133* 136 139 141  --  143  K 2.7* 3.0*   2.9* 3.5 3.2* 3.7  --  2.9*  CL 95* 106   106 110 112* 110  --  110  CO2 22 17*   18* 19* 19* 22  --  27  GLUCOSE 223* 262*   258* 212* 161* 132*  --  109*  BUN 10 12   11 13 12 11   --  16  CREATININE 0.78 0.83   0.82 0.85 0.76 0.65  --  0.67  CALCIUM 8.7* 7.5*   7.3* 7.1* 7.4* 7.7*  --  7.8*  MG 1.3* 1.9 1.8  --   --   --  2.1  PHOS  --  2.0* 2.2*  --   --  2.7  --    GFR: Estimated Creatinine Clearance: 68.9 mL/min (by C-G formula based on SCr of 0.67 mg/dL). Liver Function Tests: Recent Labs  Lab 04/07/18 1143 04/07/18 2042 04/08/18 0216 04/08/18 1626 04/10/18 0552  AST 15 11* 24 22 20   ALT 13 12 16 16 17   ALKPHOS 92 64 59 54 50  BILITOT 1.5* 1.8* 3.3* 4.2* 5.5*  PROT 7.4 5.7* 5.7* 6.0* 5.9*  ALBUMIN 3.7 2.6* 2.6* 2.5* 2.4*   No results for input(s): LIPASE, AMYLASE in the last 168 hours. No results for input(s): AMMONIA in the last 168 hours. Coagulation Profile: Recent Labs  Lab 04/07/18 2042 04/10/18 0552    INR 1.6* 1.6*   Cardiac Enzymes: Recent Labs  Lab 04/10/18 0552  CKTOTAL 11*  CKMB 0.9   BNP (last 3 results) No results for input(s): PROBNP in the last 8760 hours. HbA1C: Recent Labs    04/08/18 0216  HGBA1C 7.5*   CBG: Recent Labs  Lab 04/09/18 1653 04/09/18 1948 04/09/18 2343 04/10/18 0322 04/10/18 0732  GLUCAP 102* 86 82 87 103*   Lipid Profile: No results for input(s): CHOL, HDL, LDLCALC, TRIG, CHOLHDL, LDLDIRECT in the last 72 hours. Thyroid Function Tests: Recent Labs    04/08/18 0216  TSH 0.962   Anemia Panel: Recent Labs    04/08/18 1626  RETICCTPCT 1.6  Sepsis Labs: Recent Labs  Lab 04/07/18 1626 04/07/18 2042 04/09/18 1715 04/10/18 0552  PROCALCITON  --  14.25 69.16 46.84  LATICACIDVEN 1.9 1.0  --   --     Recent Results (from the past 240 hour(s))  Urine Culture     Status: Abnormal   Collection Time: 04/07/18 11:15 AM  Result Value Ref Range Status   Specimen Description   Final    URINE, CLEAN CATCH Performed at [facility], [place] [address], [place], [place] [address]    Special Requests   Final    NONE Performed at [facility], [place] [address], [place], [place] [address]    Culture >=100,000 COLONIES/mL KLEBSIELLA PNEUMONIAE (A)  Final   Report Status 04/10/2018 FINAL  Final   Organism ID, Bacteria KLEBSIELLA PNEUMONIAE (A)  Final      Susceptibility   Klebsiella pneumoniae - MIC*    AMPICILLIN >=32 RESISTANT Resistant     CEFAZOLIN <=4 SENSITIVE Sensitive     CEFTRIAXONE <=1 SENSITIVE Sensitive     CIPROFLOXACIN <=0.25 SENSITIVE Sensitive     GENTAMICIN <=1 SENSITIVE Sensitive     IMIPENEM <=0.25 SENSITIVE Sensitive     NITROFURANTOIN <=16 SENSITIVE Sensitive     TRIMETH/SULFA <=20 SENSITIVE Sensitive     AMPICILLIN/SULBACTAM 8 SENSITIVE Sensitive     PIP/TAZO <=4 SENSITIVE Sensitive     Extended ESBL NEGATIVE Sensitive     * >=100,000 COLONIES/mL KLEBSIELLA PNEUMONIAE   Culture, Blood     Status: None (Preliminary result)   Collection Time: 04/07/18 11:27 AM  Result Value Ref Range Status   Specimen Description   Final    BLOOD RIGHT ARM Performed at [facility], [place] [address], [place], [place] [address]    Special Requests   Final    BOTTLES DRAWN AEROBIC AND ANAEROBIC Blood Culture results may not be optimal due to an excessive volume of blood received in culture bottles   Culture   Final    NO GROWTH 3 DAYS Performed at [facility], [place] [address] [place], [place] [address]    Report Status PENDING  Incomplete  Culture, Blood     Status: None (Preliminary result)   Collection Time: 04/07/18 12:15 PM  Result Value Ref Range Status   Specimen Description   Final    PORTA CATH Performed at [facility], [place] [address] [place], [place] [address]    Special Requests   Final    BOTTLES DRAWN AEROBIC AND ANAEROBIC Blood Culture adequate volume   Culture   Final    NO GROWTH 3 DAYS Performed at [facility], [place] [address] [place], [place] [address]    Report Status PENDING  Incomplete  MRSA PCR Screening     Status: None   Collection Time: 04/07/18  8:41 PM  Result Value Ref Range Status   MRSA by PCR NEGATIVE NEGATIVE Final    Comment:        The GeneXpert MRSA Assay (FDA approved for NASAL specimens only), is one component of a comprehensive MRSA colonization surveillance program. It is not intended to diagnose MRSA infection nor to guide or monitor treatment for MRSA infections. Performed at [facility], [place] [address] [place],  [address]   Gastrointestinal Panel by PCR , Stool     Status: None   Collection Time: 04/08/18  1:06 AM  Result Value Ref Range Status  Campylobacter species NOT DETECTED NOT DETECTED Final   Plesimonas shigelloides NOT DETECTED NOT DETECTED Final   Salmonella species NOT DETECTED NOT DETECTED Final   Yersinia  enterocolitica NOT DETECTED NOT DETECTED Final   Vibrio species NOT DETECTED NOT DETECTED Final   Vibrio cholerae NOT DETECTED NOT DETECTED Final   Enteroaggregative E coli (EAEC) NOT DETECTED NOT DETECTED Final   Enteropathogenic E coli (EPEC) NOT DETECTED NOT DETECTED Final   Enterotoxigenic E coli (ETEC) NOT DETECTED NOT DETECTED Final   Shiga like toxin producing E coli (STEC) NOT DETECTED NOT DETECTED Final   Shigella/Enteroinvasive E coli (EIEC) NOT DETECTED NOT DETECTED Final   Cryptosporidium NOT DETECTED NOT DETECTED Final   Cyclospora cayetanensis NOT DETECTED NOT DETECTED Final   Entamoeba histolytica NOT DETECTED NOT DETECTED Final   Giardia lamblia NOT DETECTED NOT DETECTED Final   Adenovirus F40/41 NOT DETECTED NOT DETECTED Final   Astrovirus NOT DETECTED NOT DETECTED Final   Norovirus GI/GII NOT DETECTED NOT DETECTED Final   Rotavirus A NOT DETECTED NOT DETECTED Final   Sapovirus (I, II, IV, and V) NOT DETECTED NOT DETECTED Final    Comment: Performed at [facility], [place] [place], [place] [address]  Respiratory Panel by PCR     Status: None   Collection Time: 04/08/18  1:06 AM  Result Value Ref Range Status   Adenovirus NOT DETECTED NOT DETECTED Final   Coronavirus 229E NOT DETECTED NOT DETECTED Final    Comment: (NOTE) The Coronavirus on the Respiratory Panel, DOES NOT test for the novel  Coronavirus (2019 nCoV)    Coronavirus HKU1 NOT DETECTED NOT DETECTED Final   Coronavirus NL63 NOT DETECTED NOT DETECTED Final   Coronavirus OC43 NOT DETECTED NOT DETECTED Final   Metapneumovirus NOT DETECTED NOT DETECTED Final   Rhinovirus / Enterovirus NOT DETECTED NOT DETECTED Final   Influenza A NOT DETECTED NOT DETECTED Final   Influenza B NOT DETECTED NOT DETECTED Final   Parainfluenza Virus 1 NOT DETECTED NOT DETECTED Final   Parainfluenza Virus 2 NOT DETECTED NOT DETECTED Final   Parainfluenza Virus 3 NOT DETECTED NOT DETECTED Final   Parainfluenza  Virus 4 NOT DETECTED NOT DETECTED Final   Respiratory Syncytial Virus NOT DETECTED NOT DETECTED Final   Bordetella pertussis NOT DETECTED NOT DETECTED Final   Chlamydophila pneumoniae NOT DETECTED NOT DETECTED Final   Mycoplasma pneumoniae NOT DETECTED NOT DETECTED Final    Comment: Performed at [facility], [place] [address] [place], [place] [address]  C difficile quick scan w PCR reflex     Status: None   Collection Time: 04/08/18  2:30 PM  Result Value Ref Range Status   C Diff antigen NEGATIVE NEGATIVE Final   C Diff toxin NEGATIVE NEGATIVE Final   C Diff interpretation No C. difficile detected.  Final    Comment: Performed at [facility], [place] [address] [place], [place] [address]  Fungus culture, blood     Status: None (Preliminary result)   Collection Time: 04/09/18  5:15 PM  Result Value Ref Range Status   Specimen Description   Final    BLOOD RIGHT HAND Performed at [place] [address], [place], [place] [address]    Special Requests   Final    BOTTLES DRAWN AEROBIC AND ANAEROBIC Blood Culture adequate volume Performed at [place] [address], [place],  [address]    Culture   Final    NO  GROWTH < 12 HOURS Performed at [place] [address] [place], [place] [address]    Report Status PENDING  Incomplete         Radiology Studies: Ct Head Wo Contrast  Result Date: 04/08/2018 CLINICAL DATA:  Sepsis with known metastatic breast cancer EXAM: CT HEAD WITHOUT CONTRAST TECHNIQUE: Contiguous axial images were obtained from the base of the skull through the vertex without intravenous contrast. COMPARISON:  Brain MRI 03/16/2018 FINDINGS: Brain: There is focal hypoattenuation in the right occipital lobe, corresponding to the site of the known metastatic lesion. No acute hemorrhage or other extra-axial collection. No midline shift or significant mass effect. Normal size of the  ventricles. Vascular: No abnormal hyperdensity of the major intracranial arteries or dural venous sinuses. No intracranial atherosclerosis. Skull: The visualized skull base, calvarium and extracranial soft tissues are normal. Sinuses/Orbits: Postsurgical changes of the maxillary and ethmoid sinuses. No mastoid or middle ear effusion. The orbits are normal. IMPRESSION: 1. Hypoattenuation in the right occipital lobe, corresponding to known metastatic lesion. 2. No acute hemorrhage. Electronically Signed   By: Ulyses Jarred M.D.   On: 04/08/2018 19:18   Ct Chest W Contrast  Result Date: 04/08/2018 CLINICAL DATA:  58 year old female with metastatic breast cancer, fever, sepsis. EXAM: CT CHEST, ABDOMEN, AND PELVIS WITH CONTRAST TECHNIQUE: Multidetector CT imaging of the chest, abdomen and pelvis was performed following the standard protocol during bolus administration of intravenous contrast. CONTRAST:  132mL OMNIPAQUE IOHEXOL 300 MG/ML  SOLN COMPARISON:  Portable chest 04/07/2018. CT Chest, Abdomen, and Pelvis 03/11/2018. FINDINGS: CT CHEST FINDINGS Cardiovascular: No pericardial effusion. Stable cardiac size. Negative thoracic aorta. Central pulmonary arteries are patent. Right IJ chest porta cath in place. Mediastinum/Nodes: Small but progressed since February right paratracheal lymph nodes individually 9 millimeter short axis. No other mediastinal lymphadenopathy; postinflammatory coarsely calcified subcarinal and right hilar nodes are stable. Lungs/Pleura: New subtotal consolidation of the right upper and lower lobes with superimposed small layering right pleural effusion. This obscures some of the right lung nodule/metastases seen in February. Patchy discontinuous peribronchial opacity with some consolidation in the right middle lobe is new. Major airways are patent. New consolidation in the medial basal segment of the left lower lobe with additional new indistinct peribronchial ground-glass and nodules in the  other left lower lobe segments. Similar nodularity in the lingula with some early consolidation. In the left upper lobe some of the formally circumscribed metastatic appearing nodules now are indistinct with marginal ground-glass opacity (series 4, image 47). This is also true of the 1 or 2 visible right lung nodules. Musculoskeletal: No acute or destructive osseous lesion identified. New subcutaneous edema along the left lateral chest wall. Left axillary lymphadenopathy has mildly regressed. CT ABDOMEN PELVIS FINDINGS Hepatobiliary: Mild hepatomegaly. Trace perihepatic free fluid. Multiple hypodense liver metastases redemonstrated. The largest have not significantly changed in size or configuration. No definite new hepatic metastasis. Gallbladder remains within normal limits. Pancreas: Negative. Spleen: Stable mild splenic enlargement and small calcified granulomas. Adrenals/Urinary Tract: Negative adrenal glands. Bilateral renal enhancement and contrast excretion is symmetric and within normal limits. Benign left renal parapelvic cysts. Decompressed proximal ureters. Unremarkable urinary bladder. Stomach/Bowel: Sigmoid diverticulosis with no active inflammation. Mild motion artifact in the upper abdomen. No dilated or definitely inflamed large or small bowel loops. Decompressed stomach and duodenum. No free air. Trace perihepatic free fluid. Vascular/Lymphatic: Mild Aortoiliac calcified atherosclerosis. Major arterial structures are patent. Portal venous system seems patent. No lymphadenopathy. Reproductive: Stable and within normal  limits. Other: No pelvic free fluid. Musculoskeletal: No acute or destructive osseous lesion identified. Mild bilateral subcutaneous flank edema. IMPRESSION: 1. Severe right lung multilobar pneumonia. Multifocal early pneumonia in the left lung. Associated small layering right pleural effusion. 2. Evolution of the pulmonary nodules/presumed pulmonary metastases visible today, with  irregular and indistinct margins. This might reflect treatment affect. 3. Mildly enlarged right paratracheal mediastinal nodes, favor reactive rather than metastatic. 4. Stable liver metastases. Mild hepatomegaly and trace perihepatic free fluid. 5. No new metastatic disease identified. Electronically Signed   By: Genevie Ann M.D.   On: 04/08/2018 19:32   Ct Abdomen Pelvis W Contrast  Result Date: 04/08/2018 CLINICAL DATA:  58 year old female with metastatic breast cancer, fever, sepsis. EXAM: CT CHEST, ABDOMEN, AND PELVIS WITH CONTRAST TECHNIQUE: Multidetector CT imaging of the chest, abdomen and pelvis was performed following the standard protocol during bolus administration of intravenous contrast. CONTRAST:  14mL OMNIPAQUE IOHEXOL 300 MG/ML  SOLN COMPARISON:  Portable chest 04/07/2018. CT Chest, Abdomen, and Pelvis 03/11/2018. FINDINGS: CT CHEST FINDINGS Cardiovascular: No pericardial effusion. Stable cardiac size. Negative thoracic aorta. Central pulmonary arteries are patent. Right IJ chest porta cath in place. Mediastinum/Nodes: Small but progressed since February right paratracheal lymph nodes individually 9 millimeter short axis. No other mediastinal lymphadenopathy; postinflammatory coarsely calcified subcarinal and right hilar nodes are stable. Lungs/Pleura: New subtotal consolidation of the right upper and lower lobes with superimposed small layering right pleural effusion. This obscures some of the right lung nodule/metastases seen in February. Patchy discontinuous peribronchial opacity with some consolidation in the right middle lobe is new. Major airways are patent. New consolidation in the medial basal segment of the left lower lobe with additional new indistinct peribronchial ground-glass and nodules in the other left lower lobe segments. Similar nodularity in the lingula with some early consolidation. In the left upper lobe some of the formally circumscribed metastatic appearing nodules now are  indistinct with marginal ground-glass opacity (series 4, image 47). This is also true of the 1 or 2 visible right lung nodules. Musculoskeletal: No acute or destructive osseous lesion identified. New subcutaneous edema along the left lateral chest wall. Left axillary lymphadenopathy has mildly regressed. CT ABDOMEN PELVIS FINDINGS Hepatobiliary: Mild hepatomegaly. Trace perihepatic free fluid. Multiple hypodense liver metastases redemonstrated. The largest have not significantly changed in size or configuration. No definite new hepatic metastasis. Gallbladder remains within normal limits. Pancreas: Negative. Spleen: Stable mild splenic enlargement and small calcified granulomas. Adrenals/Urinary Tract: Negative adrenal glands. Bilateral renal enhancement and contrast excretion is symmetric and within normal limits. Benign left renal parapelvic cysts. Decompressed proximal ureters. Unremarkable urinary bladder. Stomach/Bowel: Sigmoid diverticulosis with no active inflammation. Mild motion artifact in the upper abdomen. No dilated or definitely inflamed large or small bowel loops. Decompressed stomach and duodenum. No free air. Trace perihepatic free fluid. Vascular/Lymphatic: Mild Aortoiliac calcified atherosclerosis. Major arterial structures are patent. Portal venous system seems patent. No lymphadenopathy. Reproductive: Stable and within normal limits. Other: No pelvic free fluid. Musculoskeletal: No acute or destructive osseous lesion identified. Mild bilateral subcutaneous flank edema. IMPRESSION: 1. Severe right lung multilobar pneumonia. Multifocal early pneumonia in the left lung. Associated small layering right pleural effusion. 2. Evolution of the pulmonary nodules/presumed pulmonary metastases visible today, with irregular and indistinct margins. This might reflect treatment affect. 3. Mildly enlarged right paratracheal mediastinal nodes, favor reactive rather than metastatic. 4. Stable liver metastases.  Mild hepatomegaly and trace perihepatic free fluid. 5. No new metastatic disease identified. Electronically Signed   By:  Genevie Ann M.D.   On: 04/08/2018 19:32   Dg Chest Port 1 View  Result Date: 04/09/2018 CLINICAL DATA:  Increasing shortness of breath EXAM: PORTABLE CHEST 1 VIEW COMPARISON:  04/07/2018 FINDINGS: Progressing consolidation and volume loss in the right upper lung since prior study. This likely represents progression of pneumonia. A centrally obstructing lesion is not excluded and follow-up after resolution is recommended. Calcified granulomas in the right lung and right hilum. Left lung is clear. Heart size and pulmonary vascularity are normal. No blunting of costophrenic angles. No pneumothorax. Power port type central venous catheter with tip over the low SVC region. IMPRESSION: Progressing consolidation and volume loss in the right upper lung since prior study. Electronically Signed   By: Lucienne Capers M.D.   On: 04/09/2018 20:43        Scheduled Meds:  sodium chloride   Intravenous Once   chlorhexidine  15 mL Mouth Rinse BID   Chlorhexidine Gluconate Cloth  6 each Topical Daily   diltiazem  10 mg Intravenous Once   feeding supplement  1 Container Oral TID BM   furosemide  40 mg Intravenous Q6H   insulin aspart  0-9 Units Subcutaneous Q4H   mouth rinse  15 mL Mouth Rinse q12n4p   phosphorus  250 mg Oral BID   Continuous Infusions:  dextrose 5 % and 0.9% NaCl 50 mL/hr at 04/09/18 2200   meropenem (MERREM) IV Stopped (04/10/18 0530)   vancomycin Stopped (04/09/18 2235)     LOS: 2 days     Georgette Shell, MD [facility]  If 7PM-7AM, please contact night-coverage [email or web address] Password [email or web address] 04/10/2018, 1:00 PM

## 2018-04-10 NOTE — Progress Notes (Signed)
INFECTIOUS DISEASE PROGRESS NOTE  ID: LALENA SALAS is a 58 y.o. female with  Active Problems:   Absolute anemia   Metastatic breast cancer ([facility])   Diarrhea   Sepsis ([place])   Hypokalemia   Hypomagnesemia   Acute lower UTI   Hypophosphatemia   Septic shock (H[facility])   Malnutrition of moderate degree   Acute respiratory failure with hypoxia ([facility])   Acute cystitis without hematuria  Subjective: No dyspnea. Feels comfortable.   Abtx:  Anti-infectives (From admission, onward)   Start     Dose/Rate Route Frequency Ordered Stop   04/09/18 2130  meropenem (MERREM) 1 g in sodium chloride 0.9 % 100 mL IVPB     1 g 200 mL/hr over 30 Minutes Intravenous Every 8 hours 04/09/18 2048     04/08/18 2100  vancomycin (VANCOCIN) 1,250 mg in sodium chloride 0.9 % 250 mL IVPB     1,250 mg 166.7 mL/hr over 90 Minutes Intravenous Every 24 hours 04/07/18 2000     04/08/18 0200  metroNIDAZOLE (FLAGYL) IVPB 500 mg  Status:  Discontinued     500 mg 100 mL/hr over 60 Minutes Intravenous Every 8 hours 04/07/18 1933 04/09/18 2040   04/08/18 0100  ceFEPIme (MAXIPIME) 2 g in sodium chloride 0.9 % 100 mL IVPB  Status:  Discontinued     2 g 200 mL/hr over 30 Minutes Intravenous Every 8 hours 04/07/18 1958 04/09/18 2040   04/07/18 1715  vancomycin (VANCOCIN) 1,500 mg in sodium chloride 0.9 % 500 mL IVPB     1,500 mg 250 mL/hr over 120 Minutes Intravenous  Once 04/07/18 1636 04/07/18 2226   04/07/18 1630  ceFEPIme (MAXIPIME) 2 g in sodium chloride 0.9 % 100 mL IVPB     2 g 200 mL/hr over 30 Minutes Intravenous  Once 04/07/18 1626 04/07/18 1754   04/07/18 1630  metroNIDAZOLE (FLAGYL) IVPB 500 mg     500 mg 100 mL/hr over 60 Minutes Intravenous  Once 04/07/18 1626 04/07/18 1902   04/07/18 1630  vancomycin (VANCOCIN) IVPB 1000 mg/200 mL premix  Status:  Discontinued     1,000 mg 200 mL/hr over 60 Minutes Intravenous  Once 04/07/18 1626 04/07/18 1636      Medications:  Scheduled: . sodium chloride    Intravenous Once  . chlorhexidine  15 mL Mouth Rinse BID  . Chlorhexidine Gluconate Cloth  6 each Topical Daily  . diltiazem  10 mg Intravenous Once  . feeding supplement  1 Container Oral TID BM  . furosemide  40 mg Intravenous Q6H  . insulin aspart  0-9 Units Subcutaneous Q4H  . mouth rinse  15 mL Mouth Rinse q12n4p  . phosphorus  250 mg Oral BID    Objective: Vital signs in last 24 hours: Temp:  [98.4 F (36.9 C)-101.2 F (38.4 C)] 98.6 F (37 C) (03/12 1307) Pulse Rate:  [92-118] 96 (03/12 1400) Resp:  [22-50] 34 (03/12 1400) BP: (85-136)/(43-65) 105/44 (03/12 1400) SpO2:  [95 %-100 %] 99 % (03/12 1400) FiO2 (%):  [40 %-50 %] 40 % (03/12 0312)   General appearance: alert, cooperative and no distress Resp: rhonchi anterior - bilateral Cardio: regular rate and rhythm GI: normal findings: bowel sounds normal and soft, non-tender Extremities: edema none  Lab Results Recent Labs    04/09/18 0422 04/10/18 0552  WBC 3.8* 6.6  HGB 8.3* 7.1*  HCT 26.4* 22.6*  NA 141 143  K 3.7 2.9*  CL 110 110  CO2 22 27  BUN 11 16  CREATININE 0.65 0.67   Liver Panel Recent Labs    04/08/18 1626 04/10/18 0552  PROT 6.0* 5.9*  ALBUMIN 2.5* 2.4*  AST 22 20  ALT 16 17  ALKPHOS 54 50  BILITOT 4.2* 5.5*   Sedimentation Rate No results for input(s): ESRSEDRATE in the last 72 hours. C-Reactive Protein No results for input(s): CRP in the last 72 hours.  Microbiology: Recent Results (from the past 240 hour(s))  Urine Culture     Status: Abnormal   Collection Time: 04/07/18 11:15 AM  Result Value Ref Range Status   Specimen Description   Final    URINE, CLEAN CATCH Performed at [facility], [address], [place], [place] [address]    Special Requests   Final    NONE Performed at [facility], [place] [address], [place], [place] [address]    Culture >=100,000 COLONIES/mL KLEBSIELLA PNEUMONIAE (A)  Final   Report Status  04/10/2018 FINAL  Final   Organism ID, Bacteria KLEBSIELLA PNEUMONIAE (A)  Final      Susceptibility   Klebsiella pneumoniae - MIC*    AMPICILLIN >=32 RESISTANT Resistant     CEFAZOLIN <=4 SENSITIVE Sensitive     CEFTRIAXONE <=1 SENSITIVE Sensitive     CIPROFLOXACIN <=0.25 SENSITIVE Sensitive     GENTAMICIN <=1 SENSITIVE Sensitive     IMIPENEM <=0.25 SENSITIVE Sensitive     NITROFURANTOIN <=16 SENSITIVE Sensitive     TRIMETH/SULFA <=20 SENSITIVE Sensitive     AMPICILLIN/SULBACTAM 8 SENSITIVE Sensitive     PIP/TAZO <=4 SENSITIVE Sensitive     Extended ESBL NEGATIVE Sensitive     * >=100,000 COLONIES/mL KLEBSIELLA PNEUMONIAE  Culture, Blood     Status: None (Preliminary result)   Collection Time: 04/07/18 11:27 AM  Result Value Ref Range Status   Specimen Description   Final    BLOOD RIGHT ARM Performed at [facility], [place] [address], [place], [place] [address]    Special Requests   Final    BOTTLES DRAWN AEROBIC AND ANAEROBIC Blood Culture results may not be optimal due to an excessive volume of blood received in culture bottles   Culture   Final    NO GROWTH 3 DAYS Performed at [facility], [place] [address], [place], [place] [address]    Report Status PENDING  Incomplete  Culture, Blood     Status: None (Preliminary result)   Collection Time: 04/07/18 12:15 PM  Result Value Ref Range Status   Specimen Description   Final    PORTA CATH Performed at [facility], [place] [address] [place], [place] [address]    Special Requests   Final    BOTTLES DRAWN AEROBIC AND ANAEROBIC Blood Culture adequate volume   Culture   Final    NO GROWTH 3 DAYS Performed at [facility], [place] [address], [place], [place] [address]    Report Status PENDING  Incomplete  MRSA PCR Screening     Status: None   Collection Time: 04/07/18  8:41 PM  Result Value Ref Range Status   MRSA by PCR NEGATIVE NEGATIVE Final    Comment:        The  GeneXpert MRSA Assay (FDA approved for NASAL specimens only), is one component of a comprehensive MRSA colonization surveillance program. It is not intended to diagnose MRSA infection nor to guide or monitor treatment for MRSA infections. Performed at [facility], [place] [address] [place], [place] [address]   Gastrointestinal Panel by PCR , Stool     Status: None   Collection Time: 04/08/18  1:06 AM  Result Value Ref Range Status   Campylobacter species NOT DETECTED NOT DETECTED Final   Plesimonas shigelloides NOT DETECTED NOT DETECTED Final   Salmonella species NOT DETECTED NOT DETECTED Final   Yersinia enterocolitica NOT DETECTED NOT DETECTED Final   Vibrio species NOT DETECTED NOT DETECTED Final   Vibrio cholerae NOT DETECTED NOT DETECTED Final   Enteroaggregative E coli (EAEC) NOT DETECTED NOT DETECTED Final   Enteropathogenic E coli (EPEC) NOT DETECTED NOT DETECTED Final   Enterotoxigenic E coli (ETEC) NOT DETECTED NOT DETECTED Final   Shiga like toxin producing E coli (STEC) NOT DETECTED NOT DETECTED Final   Shigella/Enteroinvasive E coli (EIEC) NOT DETECTED NOT DETECTED Final   Cryptosporidium NOT DETECTED NOT DETECTED Final   Cyclospora cayetanensis NOT DETECTED NOT DETECTED Final   Entamoeba histolytica NOT DETECTED NOT DETECTED Final   Giardia lamblia NOT DETECTED NOT DETECTED Final   Adenovirus F40/41 NOT DETECTED NOT DETECTED Final   Astrovirus NOT DETECTED NOT DETECTED Final   Norovirus GI/GII NOT DETECTED NOT DETECTED Final   Rotavirus A NOT DETECTED NOT DETECTED Final   Sapovirus (I, II, IV, and V) NOT DETECTED NOT DETECTED Final    Comment: Performed at [facility], [place] [place], [place] [address]  Respiratory Panel by PCR     Status: None   Collection Time: 04/08/18  1:06 AM  Result Value Ref Range Status   Adenovirus NOT DETECTED NOT DETECTED Final   Coronavirus 229E NOT DETECTED NOT DETECTED Final    Comment: (NOTE)  The Coronavirus on the Respiratory Panel, DOES NOT test for the novel  Coronavirus (2019 nCoV)    Coronavirus HKU1 NOT DETECTED NOT DETECTED Final   Coronavirus NL63 NOT DETECTED NOT DETECTED Final   Coronavirus OC43 NOT DETECTED NOT DETECTED Final   Metapneumovirus NOT DETECTED NOT DETECTED Final   Rhinovirus / Enterovirus NOT DETECTED NOT DETECTED Final   Influenza A NOT DETECTED NOT DETECTED Final   Influenza B NOT DETECTED NOT DETECTED Final   Parainfluenza Virus 1 NOT DETECTED NOT DETECTED Final   Parainfluenza Virus 2 NOT DETECTED NOT DETECTED Final   Parainfluenza Virus 3 NOT DETECTED NOT DETECTED Final   Parainfluenza Virus 4 NOT DETECTED NOT DETECTED Final   Respiratory Syncytial Virus NOT DETECTED NOT DETECTED Final   Bordetella pertussis NOT DETECTED NOT DETECTED Final   Chlamydophila pneumoniae NOT DETECTED NOT DETECTED Final   Mycoplasma pneumoniae NOT DETECTED NOT DETECTED Final    Comment: Performed at [facility], [place], [place], [place] [address]  C difficile quick scan w PCR reflex     Status: None   Collection Time: 04/08/18  2:30 PM  Result Value Ref Range Status   C Diff antigen NEGATIVE NEGATIVE Final   C Diff toxin NEGATIVE NEGATIVE Final   C Diff interpretation No C. difficile detected.  Final    Comment: Performed at [facility], [place] [address] [place], [place] [address]  Fungus culture, blood     Status: None (Preliminary result)   Collection Time: 04/09/18  5:15 PM  Result Value Ref Range Status   Specimen Description   Final    BLOOD RIGHT HAND Performed at [place] [address] [place], [place] [address]    Special Requests  Final    BOTTLES DRAWN AEROBIC AND ANAEROBIC Blood Culture adequate volume Performed at [place] [address], [place], [place] [address]    Culture   Final    NO GROWTH < 12 HOURS Performed at [place] [address],  [place], [place] [address]    Report Status PENDING  Incomplete    Studies/Results: Ct Head Wo Contrast  Result Date: 04/08/2018 CLINICAL DATA:  Sepsis with known metastatic breast cancer EXAM: CT HEAD WITHOUT CONTRAST TECHNIQUE: Contiguous axial images were obtained from the base of the skull through the vertex without intravenous contrast. COMPARISON:  Brain MRI 03/16/2018 FINDINGS: Brain: There is focal hypoattenuation in the right occipital lobe, corresponding to the site of the known metastatic lesion. No acute hemorrhage or other extra-axial collection. No midline shift or significant mass effect. Normal size of the ventricles. Vascular: No abnormal hyperdensity of the major intracranial arteries or dural venous sinuses. No intracranial atherosclerosis. Skull: The visualized skull base, calvarium and extracranial soft tissues are normal. Sinuses/Orbits: Postsurgical changes of the maxillary and ethmoid sinuses. No mastoid or middle ear effusion. The orbits are normal. IMPRESSION: 1. Hypoattenuation in the right occipital lobe, corresponding to known metastatic lesion. 2. No acute hemorrhage. Electronically Signed   By: Ulyses Jarred M.D.   On: 04/08/2018 19:18   Ct Chest W Contrast  Result Date: 04/08/2018 CLINICAL DATA:  58 year old female with metastatic breast cancer, fever, sepsis. EXAM: CT CHEST, ABDOMEN, AND PELVIS WITH CONTRAST TECHNIQUE: Multidetector CT imaging of the chest, abdomen and pelvis was performed following the standard protocol during bolus administration of intravenous contrast. CONTRAST:  147mL OMNIPAQUE IOHEXOL 300 MG/ML  SOLN COMPARISON:  Portable chest 04/07/2018. CT Chest, Abdomen, and Pelvis 03/11/2018. FINDINGS: CT CHEST FINDINGS Cardiovascular: No pericardial effusion. Stable cardiac size. Negative thoracic aorta. Central pulmonary arteries are patent. Right IJ chest porta cath in place. Mediastinum/Nodes: Small but progressed since February right paratracheal lymph nodes  individually 9 millimeter short axis. No other mediastinal lymphadenopathy; postinflammatory coarsely calcified subcarinal and right hilar nodes are stable. Lungs/Pleura: New subtotal consolidation of the right upper and lower lobes with superimposed small layering right pleural effusion. This obscures some of the right lung nodule/metastases seen in February. Patchy discontinuous peribronchial opacity with some consolidation in the right middle lobe is new. Major airways are patent. New consolidation in the medial basal segment of the left lower lobe with additional new indistinct peribronchial ground-glass and nodules in the other left lower lobe segments. Similar nodularity in the lingula with some early consolidation. In the left upper lobe some of the formally circumscribed metastatic appearing nodules now are indistinct with marginal ground-glass opacity (series 4, image 47). This is also true of the 1 or 2 visible right lung nodules. Musculoskeletal: No acute or destructive osseous lesion identified. New subcutaneous edema along the left lateral chest wall. Left axillary lymphadenopathy has mildly regressed. CT ABDOMEN PELVIS FINDINGS Hepatobiliary: Mild hepatomegaly. Trace perihepatic free fluid. Multiple hypodense liver metastases redemonstrated. The largest have not significantly changed in size or configuration. No definite new hepatic metastasis. Gallbladder remains within normal limits. Pancreas: Negative. Spleen: Stable mild splenic enlargement and small calcified granulomas. Adrenals/Urinary Tract: Negative adrenal glands. Bilateral renal enhancement and contrast excretion is symmetric and within normal limits. Benign left renal parapelvic cysts. Decompressed proximal ureters. Unremarkable urinary bladder. Stomach/Bowel: Sigmoid diverticulosis with no active inflammation. Mild motion artifact in the upper abdomen. No dilated or definitely inflamed large or small bowel loops. Decompressed stomach  and  duodenum. No free air. Trace perihepatic free fluid. Vascular/Lymphatic: Mild Aortoiliac calcified atherosclerosis. Major arterial structures are patent. Portal venous system seems patent. No lymphadenopathy. Reproductive: Stable and within normal limits. Other: No pelvic free fluid. Musculoskeletal: No acute or destructive osseous lesion identified. Mild bilateral subcutaneous flank edema. IMPRESSION: 1. Severe right lung multilobar pneumonia. Multifocal early pneumonia in the left lung. Associated small layering right pleural effusion. 2. Evolution of the pulmonary nodules/presumed pulmonary metastases visible today, with irregular and indistinct margins. This might reflect treatment affect. 3. Mildly enlarged right paratracheal mediastinal nodes, favor reactive rather than metastatic. 4. Stable liver metastases. Mild hepatomegaly and trace perihepatic free fluid. 5. No new metastatic disease identified. Electronically Signed   By: Genevie Ann M.D.   On: 04/08/2018 19:32   Ct Abdomen Pelvis W Contrast  Result Date: 04/08/2018 CLINICAL DATA:  58 year old female with metastatic breast cancer, fever, sepsis. EXAM: CT CHEST, ABDOMEN, AND PELVIS WITH CONTRAST TECHNIQUE: Multidetector CT imaging of the chest, abdomen and pelvis was performed following the standard protocol during bolus administration of intravenous contrast. CONTRAST:  112mL OMNIPAQUE IOHEXOL 300 MG/ML  SOLN COMPARISON:  Portable chest 04/07/2018. CT Chest, Abdomen, and Pelvis 03/11/2018. FINDINGS: CT CHEST FINDINGS Cardiovascular: No pericardial effusion. Stable cardiac size. Negative thoracic aorta. Central pulmonary arteries are patent. Right IJ chest porta cath in place. Mediastinum/Nodes: Small but progressed since February right paratracheal lymph nodes individually 9 millimeter short axis. No other mediastinal lymphadenopathy; postinflammatory coarsely calcified subcarinal and right hilar nodes are stable. Lungs/Pleura: New subtotal consolidation  of the right upper and lower lobes with superimposed small layering right pleural effusion. This obscures some of the right lung nodule/metastases seen in February. Patchy discontinuous peribronchial opacity with some consolidation in the right middle lobe is new. Major airways are patent. New consolidation in the medial basal segment of the left lower lobe with additional new indistinct peribronchial ground-glass and nodules in the other left lower lobe segments. Similar nodularity in the lingula with some early consolidation. In the left upper lobe some of the formally circumscribed metastatic appearing nodules now are indistinct with marginal ground-glass opacity (series 4, image 47). This is also true of the 1 or 2 visible right lung nodules. Musculoskeletal: No acute or destructive osseous lesion identified. New subcutaneous edema along the left lateral chest wall. Left axillary lymphadenopathy has mildly regressed. CT ABDOMEN PELVIS FINDINGS Hepatobiliary: Mild hepatomegaly. Trace perihepatic free fluid. Multiple hypodense liver metastases redemonstrated. The largest have not significantly changed in size or configuration. No definite new hepatic metastasis. Gallbladder remains within normal limits. Pancreas: Negative. Spleen: Stable mild splenic enlargement and small calcified granulomas. Adrenals/Urinary Tract: Negative adrenal glands. Bilateral renal enhancement and contrast excretion is symmetric and within normal limits. Benign left renal parapelvic cysts. Decompressed proximal ureters. Unremarkable urinary bladder. Stomach/Bowel: Sigmoid diverticulosis with no active inflammation. Mild motion artifact in the upper abdomen. No dilated or definitely inflamed large or small bowel loops. Decompressed stomach and duodenum. No free air. Trace perihepatic free fluid. Vascular/Lymphatic: Mild Aortoiliac calcified atherosclerosis. Major arterial structures are patent. Portal venous system seems patent. No  lymphadenopathy. Reproductive: Stable and within normal limits. Other: No pelvic free fluid. Musculoskeletal: No acute or destructive osseous lesion identified. Mild bilateral subcutaneous flank edema. IMPRESSION: 1. Severe right lung multilobar pneumonia. Multifocal early pneumonia in the left lung. Associated small layering right pleural effusion. 2. Evolution of the pulmonary nodules/presumed pulmonary metastases visible today, with irregular and indistinct margins. This might reflect treatment affect. 3. Mildly enlarged  right paratracheal mediastinal nodes, favor reactive rather than metastatic. 4. Stable liver metastases. Mild hepatomegaly and trace perihepatic free fluid. 5. No new metastatic disease identified. Electronically Signed   By: Genevie Ann M.D.   On: 04/08/2018 19:32   Dg Chest Port 1 View  Result Date: 04/09/2018 CLINICAL DATA:  Increasing shortness of breath EXAM: PORTABLE CHEST 1 VIEW COMPARISON:  04/07/2018 FINDINGS: Progressing consolidation and volume loss in the right upper lung since prior study. This likely represents progression of pneumonia. A centrally obstructing lesion is not excluded and follow-up after resolution is recommended. Calcified granulomas in the right lung and right hilum. Left lung is clear. Heart size and pulmonary vascularity are normal. No blunting of costophrenic angles. No pneumothorax. Power port type central venous catheter with tip over the low SVC region. IMPRESSION: Progressing consolidation and volume loss in the right upper lung since prior study. Electronically Signed   By: Lucienne Capers M.D.   On: 04/09/2018 20:43     Assessment/Plan: Fever, septic shock Multilobar pneumonia Metastatic breast cancer  Diarrhea Uncontrolled DM (A1C 12.9) Kleb pneumo UTI  Total days of antibiotics: 2vanco/merrem  Has defervesced Will stop vanco, BCx have been ngtd and MRSA pcr is (-). will keep her on merrem for 1 more day.  resp status- still tachypneic  but now on 4L [place]. Much more comfortable.            Bobby Rumpf MD, FACP Infectious Diseases (pager) [phone] [email or web address] 04/10/2018, 2:25 PM  LOS: 2 days

## 2018-04-10 NOTE — Progress Notes (Signed)
NAME:  Stacy Shelton, MRN:  [id number], DOB:  December 31, 1960, LOS: 2 ADMISSION DATE:  04/07/2018, CONSULTATION DATE:  04/07/18 REFERRING MD:  Roel Cluck, CHIEF COMPLAINT: hypotension  Brief History   58 yo WF widely metastatic breast cancer (to bone, liver) on chemo (last treatment 03/26/18) admitted 3/9 fever and acute onset of explosive diarrhea for the past 24-48h.  Hypotesnion on admit with Hgb 5.7, initially required vasopressors but weaned off after transfusion. Required O2 for saturations into the 80's.  Required intermittent BiPAP  Past Medical History  Metastatic Breast [facility] Events   3/09  Admit  3/10  Off pressors, intermittent bipap.  Febrile. Ongoing diarrhea.   Consults:  PCCM   Procedures:    Significant Diagnostic Tests:  CT Head / Chest / ABD / Pelvis 3/10 >> severe right lung multilobar pneumonia, multifocal early PNA in the left lung, small layering right effusion, no new metastatic disease, stable liver mets, evolution of pulmonary nodules / presumed pulmonary metastases visible with irregular / indistinct margins  Micro Data:  BCx2 3/9 (cancer center) >>  GI PCR 3/10 >> negative  C-Diff 3/10 >> negative  RVP 3/10 >> negative   Antimicrobials:  Vanco 3/9 >>  Cefepime 3/9 >> off Flagyl 3/9 >> off 04/09/2018 meropenem>>  Interim history/subjective:  Currently able to tolerate being off BiPAP.  Continue to monitor.  Does not need to be intubated at this time.  Objective    Blood pressure (!) 109/46, pulse (!) 109, temperature (!) 101.4 F (38.6 C), resp. rate (!) 29, height 5\' 2"  (1.575 m), weight 67.1 kg, last menstrual period 10/30/2010, SpO2 97 %.  Exam: General: Frail female in no acute distress.  Taken off BiPAP and able to communicate without problem HEENT: JVD lymphadenopathy is appreciated Neuro: Grossly intact follows commands appears upbeat CV: Sounds are regular PULM: even/non-labored, lungs bilaterally managed  throughout TO:IZTI, non-tender, bsx4 active  Extremities: warm/dry, 1+ edema  Skin: no rashes or lesions   Labs    CBC: Recent Labs  Lab 04/07/18 1141 04/07/18 2042 04/08/18 0216 04/08/18 1626 04/09/18 0422 04/10/18 0552  WBC 11.1* 7.5 9.1 3.7* 3.8* 6.6  NEUTROABS 4.2 3.6  --  1.5* 2.4  --   HGB 7.2* 5.7* 7.6* 8.1* 8.3* 7.1*  HCT 22.9* 18.4* 24.0* 26.3* 26.4* 22.6*  MCV 83.3 86.0 85.7 87.1 84.9 85.3  PLT 151 109* 107* 74* 66* 58*    Basic Metabolic Panel: Recent Labs  Lab 04/07/18 1143 04/07/18 2042 04/08/18 0216 04/08/18 1626 04/09/18 0422 04/09/18 1102 04/10/18 0552  NA 132* 135   133* 136 139 141  --  143  K 2.7* 3.0*   2.9* 3.5 3.2* 3.7  --  2.9*  CL 95* 106   106 110 112* 110  --  110  CO2 22 17*   18* 19* 19* 22  --  27  GLUCOSE 223* 262*   258* 212* 161* 132*  --  109*  BUN 10 12   11 13 12 11   --  16  CREATININE 0.78 0.83   0.82 0.85 0.76 0.65  --  0.67  CALCIUM 8.7* 7.5*   7.3* 7.1* 7.4* 7.7*  --  7.8*  MG 1.3* 1.9 1.8  --   --   --  2.1  PHOS  --  2.0* 2.2*  --   --  2.7  --    GFR: Estimated Creatinine Clearance: 68.9 mL/min (by C-G formula based on SCr of 0.67  mg/dL). Recent Labs  Lab 04/07/18 1626  04/07/18 2042 04/08/18 0216 04/08/18 1626 04/09/18 0422 04/09/18 1715 04/10/18 0552  PROCALCITON  --   --  14.25  --   --   --  69.16 46.84  WBC  --    < > 7.5 9.1 3.7* 3.8*  --  6.6  LATICACIDVEN 1.9  --  1.0  --   --   --   --   --    < > = values in this interval not displayed.    Liver Function Tests: Recent Labs  Lab 04/07/18 1143 04/07/18 2042 04/08/18 0216 04/08/18 1626 04/10/18 0552  AST 15 11* 24 22 20   ALT 13 12 16 16 17   ALKPHOS 92 64 59 54 50  BILITOT 1.5* 1.8* 3.3* 4.2* 5.5*  PROT 7.4 5.7* 5.7* 6.0* 5.9*  ALBUMIN 3.7 2.6* 2.6* 2.5* 2.4*   No results for input(s): LIPASE, AMYLASE in the last 168 hours. No results for input(s): AMMONIA in the last 168 hours.  ABG    Component Value Date/Time   PHART 7.321 (L)  04/08/2018 1547   PCO2ART 37.5 04/08/2018 1547   PO2ART 121 (H) 04/08/2018 1547   HCO3 18.2 (L) 04/08/2018 1547   ACIDBASEDEF 6.1 (H) 04/08/2018 1547   O2SAT 98.0 04/08/2018 1547     Coagulation Profile: Recent Labs  Lab 04/07/18 2042 04/10/18 0552  INR 1.6* 1.6*    Cardiac Enzymes: Recent Labs  Lab 04/10/18 0552  CKTOTAL 11*  CKMB 0.9    HbA1C: Hgb A1c MFr Bld  Date/Time Value Ref Range Status  04/08/2018 02:16 AM 7.5 (H) 4.8 - 5.6 % Final    Comment:    (NOTE) Pre diabetes:          5.7%-6.4% Diabetes:              >6.4% Glycemic control for   <7.0% adults with diabetes   03/10/2018 11:50 PM 12.9 (H) 4.8 - 5.6 % Final    Comment:    (NOTE) Pre diabetes:          5.7%-6.4% Diabetes:              >6.4% Glycemic control for   <7.0% adults with diabetes     CBG: Recent Labs  Lab 04/09/18 1653 04/09/18 1948 04/09/18 2343 04/10/18 0322 04/10/18 0732  GLUCAP 102* 86 82 87 103*    Ct Head Wo Contrast  Result Date: 04/08/2018 CLINICAL DATA:  Sepsis with known metastatic breast cancer EXAM: CT HEAD WITHOUT CONTRAST TECHNIQUE: Contiguous axial images were obtained from the base of the skull through the vertex without intravenous contrast. COMPARISON:  Brain MRI 03/16/2018 FINDINGS: Brain: There is focal hypoattenuation in the right occipital lobe, corresponding to the site of the known metastatic lesion. No acute hemorrhage or other extra-axial collection. No midline shift or significant mass effect. Normal size of the ventricles. Vascular: No abnormal hyperdensity of the major intracranial arteries or dural venous sinuses. No intracranial atherosclerosis. Skull: The visualized skull base, calvarium and extracranial soft tissues are normal. Sinuses/Orbits: Postsurgical changes of the maxillary and ethmoid sinuses. No mastoid or middle ear effusion. The orbits are normal. IMPRESSION: 1. Hypoattenuation in the right occipital lobe, corresponding to known metastatic  lesion. 2. No acute hemorrhage. Electronically Signed   By: Ulyses Jarred M.D.   On: 04/08/2018 19:18   Ct Chest W Contrast  Result Date: 04/08/2018 CLINICAL DATA:  58 year old female with metastatic breast cancer, fever, sepsis. EXAM: CT CHEST,  ABDOMEN, AND PELVIS WITH CONTRAST TECHNIQUE: Multidetector CT imaging of the chest, abdomen and pelvis was performed following the standard protocol during bolus administration of intravenous contrast. CONTRAST:  18mL OMNIPAQUE IOHEXOL 300 MG/ML  SOLN COMPARISON:  Portable chest 04/07/2018. CT Chest, Abdomen, and Pelvis 03/11/2018. FINDINGS: CT CHEST FINDINGS Cardiovascular: No pericardial effusion. Stable cardiac size. Negative thoracic aorta. Central pulmonary arteries are patent. Right IJ chest porta cath in place. Mediastinum/Nodes: Small but progressed since February right paratracheal lymph nodes individually 9 millimeter short axis. No other mediastinal lymphadenopathy; postinflammatory coarsely calcified subcarinal and right hilar nodes are stable. Lungs/Pleura: New subtotal consolidation of the right upper and lower lobes with superimposed small layering right pleural effusion. This obscures some of the right lung nodule/metastases seen in February. Patchy discontinuous peribronchial opacity with some consolidation in the right middle lobe is new. Major airways are patent. New consolidation in the medial basal segment of the left lower lobe with additional new indistinct peribronchial ground-glass and nodules in the other left lower lobe segments. Similar nodularity in the lingula with some early consolidation. In the left upper lobe some of the formally circumscribed metastatic appearing nodules now are indistinct with marginal ground-glass opacity (series 4, image 47). This is also true of the 1 or 2 visible right lung nodules. Musculoskeletal: No acute or destructive osseous lesion identified. New subcutaneous edema along the left lateral chest wall. Left  axillary lymphadenopathy has mildly regressed. CT ABDOMEN PELVIS FINDINGS Hepatobiliary: Mild hepatomegaly. Trace perihepatic free fluid. Multiple hypodense liver metastases redemonstrated. The largest have not significantly changed in size or configuration. No definite new hepatic metastasis. Gallbladder remains within normal limits. Pancreas: Negative. Spleen: Stable mild splenic enlargement and small calcified granulomas. Adrenals/Urinary Tract: Negative adrenal glands. Bilateral renal enhancement and contrast excretion is symmetric and within normal limits. Benign left renal parapelvic cysts. Decompressed proximal ureters. Unremarkable urinary bladder. Stomach/Bowel: Sigmoid diverticulosis with no active inflammation. Mild motion artifact in the upper abdomen. No dilated or definitely inflamed large or small bowel loops. Decompressed stomach and duodenum. No free air. Trace perihepatic free fluid. Vascular/Lymphatic: Mild Aortoiliac calcified atherosclerosis. Major arterial structures are patent. Portal venous system seems patent. No lymphadenopathy. Reproductive: Stable and within normal limits. Other: No pelvic free fluid. Musculoskeletal: No acute or destructive osseous lesion identified. Mild bilateral subcutaneous flank edema. IMPRESSION: 1. Severe right lung multilobar pneumonia. Multifocal early pneumonia in the left lung. Associated small layering right pleural effusion. 2. Evolution of the pulmonary nodules/presumed pulmonary metastases visible today, with irregular and indistinct margins. This might reflect treatment affect. 3. Mildly enlarged right paratracheal mediastinal nodes, favor reactive rather than metastatic. 4. Stable liver metastases. Mild hepatomegaly and trace perihepatic free fluid. 5. No new metastatic disease identified. Electronically Signed   By: Genevie Ann M.D.   On: 04/08/2018 19:32   Ct Abdomen Pelvis W Contrast  Result Date: 04/08/2018 CLINICAL DATA:  58 year old female with  metastatic breast cancer, fever, sepsis. EXAM: CT CHEST, ABDOMEN, AND PELVIS WITH CONTRAST TECHNIQUE: Multidetector CT imaging of the chest, abdomen and pelvis was performed following the standard protocol during bolus administration of intravenous contrast. CONTRAST:  123mL OMNIPAQUE IOHEXOL 300 MG/ML  SOLN COMPARISON:  Portable chest 04/07/2018. CT Chest, Abdomen, and Pelvis 03/11/2018. FINDINGS: CT CHEST FINDINGS Cardiovascular: No pericardial effusion. Stable cardiac size. Negative thoracic aorta. Central pulmonary arteries are patent. Right IJ chest porta cath in place. Mediastinum/Nodes: Small but progressed since February right paratracheal lymph nodes individually 9 millimeter short axis. No other mediastinal lymphadenopathy; postinflammatory coarsely  calcified subcarinal and right hilar nodes are stable. Lungs/Pleura: New subtotal consolidation of the right upper and lower lobes with superimposed small layering right pleural effusion. This obscures some of the right lung nodule/metastases seen in February. Patchy discontinuous peribronchial opacity with some consolidation in the right middle lobe is new. Major airways are patent. New consolidation in the medial basal segment of the left lower lobe with additional new indistinct peribronchial ground-glass and nodules in the other left lower lobe segments. Similar nodularity in the lingula with some early consolidation. In the left upper lobe some of the formally circumscribed metastatic appearing nodules now are indistinct with marginal ground-glass opacity (series 4, image 47). This is also true of the 1 or 2 visible right lung nodules. Musculoskeletal: No acute or destructive osseous lesion identified. New subcutaneous edema along the left lateral chest wall. Left axillary lymphadenopathy has mildly regressed. CT ABDOMEN PELVIS FINDINGS Hepatobiliary: Mild hepatomegaly. Trace perihepatic free fluid. Multiple hypodense liver metastases redemonstrated. The  largest have not significantly changed in size or configuration. No definite new hepatic metastasis. Gallbladder remains within normal limits. Pancreas: Negative. Spleen: Stable mild splenic enlargement and small calcified granulomas. Adrenals/Urinary Tract: Negative adrenal glands. Bilateral renal enhancement and contrast excretion is symmetric and within normal limits. Benign left renal parapelvic cysts. Decompressed proximal ureters. Unremarkable urinary bladder. Stomach/Bowel: Sigmoid diverticulosis with no active inflammation. Mild motion artifact in the upper abdomen. No dilated or definitely inflamed large or small bowel loops. Decompressed stomach and duodenum. No free air. Trace perihepatic free fluid. Vascular/Lymphatic: Mild Aortoiliac calcified atherosclerosis. Major arterial structures are patent. Portal venous system seems patent. No lymphadenopathy. Reproductive: Stable and within normal limits. Other: No pelvic free fluid. Musculoskeletal: No acute or destructive osseous lesion identified. Mild bilateral subcutaneous flank edema. IMPRESSION: 1. Severe right lung multilobar pneumonia. Multifocal early pneumonia in the left lung. Associated small layering right pleural effusion. 2. Evolution of the pulmonary nodules/presumed pulmonary metastases visible today, with irregular and indistinct margins. This might reflect treatment affect. 3. Mildly enlarged right paratracheal mediastinal nodes, favor reactive rather than metastatic. 4. Stable liver metastases. Mild hepatomegaly and trace perihepatic free fluid. 5. No new metastatic disease identified. Electronically Signed   By: Genevie Ann M.D.   On: 04/08/2018 19:32   Dg Chest Port 1 View  Result Date: 04/09/2018 CLINICAL DATA:  Increasing shortness of breath EXAM: PORTABLE CHEST 1 VIEW COMPARISON:  04/07/2018 FINDINGS: Progressing consolidation and volume loss in the right upper lung since prior study. This likely represents progression of pneumonia. A  centrally obstructing lesion is not excluded and follow-up after resolution is recommended. Calcified granulomas in the right lung and right hilum. Left lung is clear. Heart size and pulmonary vascularity are normal. No blunting of costophrenic angles. No pneumothorax. Power port type central venous catheter with tip over the low SVC region. IMPRESSION: Progressing consolidation and volume loss in the right upper lung since prior study. Electronically Signed   By: Lucienne Capers M.D.   On: 04/09/2018 20:43   Resolved Hospital Problem list     Assessment / Plan   Shock - resolved -multifactorial in setting of anemia, dense RLL PNA, hypovolemia -LVEF 03/21/18 60-65% P:  Stepdown monitoring And Tylenol for fever Attempt gentle diuresis  Diarrhea  -viral panel negative P:  Continue to monitor for diarrhea  RLL PNA  Tachypnea / Acute Hypoxemic Respiratory Failure P: Continue empiric antimicrobial therapy Continue BiPAP PRN and nocturnal.  04/10/2018 we will try 4 hours off 4 hours on. Family  and patient are agreeable to intubation if needed for short-term Serial chest x-ray Gentle diuresis with 40 mg of Lasix x2 doses every 6 hours apart   Known RUL Opacity / Nodule -thought to be area of metastatic disease P: Continue to monitor  Metastatic Breast Cancer P: Onocolgy following  Anemia Recent Labs    04/09/18 0422 04/10/18 0552  HGB 8.3* 7.1*    -s/p 1 units PRBC 3/9 Pancytopenia  P: Tx per protocol   Best practice:  Diet: clear liquid, per primary  Pain/Anxiety/Delirium protocol (if indicated):  VAP protocol (if indicated): n/a DVT prophylaxis: SCD  GI prophylaxis: n/a Glucose control: SSI Mobility: as tolerated  Code Status: FC.  Pt and family updated at bedside 3/12 Family Communication: Patient, mother and best friend updated at bedside 3/11.   Disposition: SDU   Critical care time: 30 minutes     Richardson Landry Karlisa Gaubert ACNP Maryanna Shape PCCM Pager [phone] till  1 pm If no answer page [phone] 04/10/2018, 10:49 AM

## 2018-04-10 NOTE — Progress Notes (Signed)
These preliminary result these preliminary results were noted.  Awaiting final report.

## 2018-04-10 NOTE — Progress Notes (Signed)
Pt placed on nocturnal cpap per home regimen, 3L o2 bled in.  Pt refuses bipap at night.  HR105, rr30, spo2 96%.  Pt tolerating well at this time.

## 2018-04-11 ENCOUNTER — Encounter (HOSPITAL_COMMUNITY): Payer: Self-pay

## 2018-04-11 ENCOUNTER — Inpatient Hospital Stay (HOSPITAL_COMMUNITY): Payer: BLUE CROSS/BLUE SHIELD

## 2018-04-11 LAB — TYPE AND SCREEN
ABO/RH(D): A POS
Antibody Screen: NEGATIVE
Unit division: 0
Unit division: 0
Unit division: 0

## 2018-04-11 LAB — GLUCOSE, CAPILLARY
GLUCOSE-CAPILLARY: 137 mg/dL — AB (ref 70–99)
Glucose-Capillary: 142 mg/dL — ABNORMAL HIGH (ref 70–99)
Glucose-Capillary: 155 mg/dL — ABNORMAL HIGH (ref 70–99)
Glucose-Capillary: 173 mg/dL — ABNORMAL HIGH (ref 70–99)
Glucose-Capillary: 143 mg/dL — ABNORMAL HIGH (ref 70–99)
Glucose-Capillary: 226 mg/dL — ABNORMAL HIGH (ref 70–99)

## 2018-04-11 LAB — LEGIONELLA PNEUMOPHILA SEROGP 1 UR AG: L. PNEUMOPHILA SEROGP 1 UR AG: NEGATIVE

## 2018-04-11 LAB — BPAM RBC
BLOOD PRODUCT EXPIRATION DATE: 202003222359
Blood Product Expiration Date: 202004042359
Blood Product Expiration Date: 202004072359
ISSUE DATE / TIME: 202003092222
ISSUE DATE / TIME: 202003100027
ISSUE DATE / TIME: 202003121228
Unit Type and Rh: 6200
Unit Type and Rh: 6200
Unit Type and Rh: 6200

## 2018-04-11 LAB — COMPREHENSIVE METABOLIC PANEL
ALT: 35 U/L (ref 0–44)
AST: 54 U/L — ABNORMAL HIGH (ref 15–41)
Albumin: 2.1 g/dL — ABNORMAL LOW (ref 3.5–5.0)
Alkaline Phosphatase: 91 U/L (ref 38–126)
Anion gap: 10 (ref 5–15)
BUN: 18 mg/dL (ref 6–20)
CO2: 28 mmol/L (ref 22–32)
Calcium: 7.7 mg/dL — ABNORMAL LOW (ref 8.9–10.3)
Chloride: 103 mmol/L (ref 98–111)
Creatinine, Ser: 0.51 mg/dL (ref 0.44–1.00)
GFR calc non Af Amer: >60 mL/min (ref >60)
Glucose, Bld: 156 mg/dL — ABNORMAL HIGH (ref 70–99)
Potassium: 2.9 mmol/L — ABNORMAL LOW (ref 3.5–5.1)
SODIUM: 141 mmol/L (ref 135–145)
Total Bilirubin: 7.9 mg/dL — ABNORMAL HIGH (ref 0.3–1.2)
Total Protein: 5.5 g/dL — ABNORMAL LOW (ref 6.5–8.1)

## 2018-04-11 LAB — CBC
HCT: 27.5 % — ABNORMAL LOW (ref 36.0–46.0)
Hemoglobin: 8.6 g/dL — ABNORMAL LOW (ref 12.0–15.0)
MCH: 27.3 pg (ref 26.0–34.0)
MCHC: 31.3 g/dL (ref 30.0–36.0)
MCV: 87.3 fL (ref 80.0–100.0)
PLATELETS: 46 10*3/uL — AB (ref 150–400)
RBC: 3.15 MIL/uL — ABNORMAL LOW (ref 3.87–5.11)
RDW: 20.2 % — ABNORMAL HIGH (ref 11.5–15.5)
WBC: 10.2 10*3/uL (ref 4.0–10.5)
nRBC: 0.7 % — ABNORMAL HIGH (ref 0.0–0.2)

## 2018-04-11 LAB — PHOSPHORUS: Phosphorus: 1.7 mg/dL — ABNORMAL LOW (ref 2.5–4.6)

## 2018-04-11 LAB — MAGNESIUM: MAGNESIUM: 1.8 mg/dL (ref 1.7–2.4)

## 2018-04-11 LAB — PROCALCITONIN: Procalcitonin: 23.39 ng/mL

## 2018-04-11 MED ORDER — MAGNESIUM SULFATE 2 GM/50ML IV SOLN
2.0000 g | Freq: Once | INTRAVENOUS | Status: AC
  Administered 2018-04-11: 2 g via INTRAVENOUS
  Filled 2018-04-11: qty 50

## 2018-04-11 MED ORDER — POTASSIUM CHLORIDE 10 MEQ/100ML IV SOLN
10.0000 meq | INTRAVENOUS | Status: AC
  Administered 2018-04-11 (×4): 10 meq via INTRAVENOUS
  Filled 2018-04-11 (×4): qty 100

## 2018-04-11 MED ORDER — SODIUM CHLORIDE 0.9 % IV SOLN
1.0000 g | INTRAVENOUS | Status: AC
  Administered 2018-04-11 – 2018-04-15 (×5): 1 g via INTRAVENOUS
  Filled 2018-04-11: qty 10
  Filled 2018-04-11 (×4): qty 1

## 2018-04-11 NOTE — Progress Notes (Signed)
Entered room to place patient on CPAP as ordered and patient is aggressive with this RT and says she really does not want to wear because she cant sleep on her side.  Reminded patient that she can refuse treatment at any time.  RN, in room at time of conversation.

## 2018-04-11 NOTE — Progress Notes (Signed)
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   In the ED blood pressuresdid not respond to aggressive IV fluid hydration and was started on pressors. Blood cultures weredrawn andshewas started on broad-spectrum IV antibiotics. Transferedto stepdown unit for pressor support   Assessment & Plan:   Active Problems:   Absolute anemia   Metastatic breast cancer (HCC)   Diarrhea   Sepsis (HCC)   Hypokalemia   Hypomagnesemia   Acute lower UTI   Hypophosphatemia   Septic shock (HCC)   Malnutrition of moderate degree   Acute respiratory failure with hypoxia (HCC)   Acute cystitis without hematuria   Pneumonia of both lungs due to infectious organism   #1 sepsis/septic shock resolved patient off of pressors 04/08/2018. CT scan of the chest show severe right lung multilobar pneumonia, multifocal early pneumonia in the left lung with associated small layering right pleural effusion.  Patient currently on meropenem.  Followed by infectious disease.  Antibiotics per ID.  Urine culture growing Klebsiella pneumonia sensitive to meropenem.  #2 respiratory distress secondary to bilateral pneumonia.  Patient used CPAP overnight.  When I saw her this morning she appeared very comfortable on CPAP and was sleeping deep.    #3 pancytopenia probably secondary to recent chemo versus sepsis. Hemoglobin this morning is  8.6 status post 1 unit of transfusion.Will transfuse to keep hemoglobin above 8.  #4 diastolic CHF stable  #5 type 2 diabetes uncontrolled hemoglobin A1c  12.9. Continue Levemir insulin 15 units daily. Her blood glucose has been stable here.  #6 metastatic breast cancer with mets to liver brain and bone.s/post recent chemo.  #7 sinus tachycardia proved.   DVT prophylaxis:SCD Code Status:Full code  family Communication:Family at bedside Disposition Plan:Pending clinical improvement  Consultants:  PCCM infectious disease cardiology oncology  Procedures:None Antimicrobials: meropenem   Nutrition Problem: Moderate Malnutrition Etiology: chronic illness, cancer and cancer related treatments     Signs/Symptoms: mild fat depletion, mild muscle depletion, percent weight loss Percent weight loss: 11 %    Interventions: Boost Breeze, MVI  Estimated body mass index is 27.07 kg/m as calculated from the following:   Height as of this encounter: 5\' 2"  (1.575 m).   Weight as of this encounter: 67.1 kg.    Subjective: Patient resting in bed asleep on CPAP appears to be in no acute distress  Objective: Vitals:   04/11/18 0500 04/11/18 0600 04/11/18 0700 04/11/18 0813  BP: (!) 145/67 (!) 142/71 (!) 142/71   Pulse: (!) 103 (!) 105 (!) 108   Resp: (!) 29 (!) 37 (!) 43   Temp:    (!) 101.2 F (38.4 C)  TempSrc:    Oral  SpO2: 98% 96% 97%   Weight:      Height:        Intake/Output Summary (Last 24 hours) at 04/11/2018 0955 Last data filed at 04/11/2018 0700 Gross per 24 hour  Intake 1119.33 ml  Output 2800 ml  Net -1680.67 ml   Filed Weights   04/07/18 1614  Weight: 67.1 kg    Examination:  General exam: Appears calm and comfortable  Respiratory system: Clearer to auscultation. Respiratory  effort normal. Cardiovascular system: S1 & S2 heard, RRR. No JVD, murmurs, rubs, gallops or clicks. No pedal edema. Gastrointestinal system: Abdomen is nondistended, soft and nontender. No organomegaly or masses felt. Normal bowel sounds heard. Central nervous system: Alert and oriented. No focal neurological  deficits. Extremities: Symmetric 5 x 5 power. Skin: No rashes, lesions or ulcers Psychiatry: Judgement and insight appear normal. Mood & affect appropriate.     Data Reviewed: I have personally reviewed following labs and imaging studies  CBC: Recent Labs  Lab 04/07/18 1141  04/07/18 2042 04/08/18 0216 04/08/18 1626 04/09/18 0422 04/10/18 0552 04/11/18 0415  WBC 11.1*   < > 7.5 9.1 3.7* 3.8* 6.6 10.2  NEUTROABS 4.2  --  3.6  --  1.5* 2.4  --   --   HGB 7.2*   < > 5.7* 7.6* 8.1* 8.3* 7.1* 8.6*  HCT 22.9*  --  18.4* 24.0* 26.3* 26.4* 22.6* 27.5*  MCV 83.3  --  86.0 85.7 87.1 84.9 85.3 87.3  PLT 151   < > 109* 107* 74* 66* 58* 46*   < > = values in this interval not displayed.   Basic Metabolic Panel: Recent Labs  Lab 04/07/18 1143 04/07/18 2042 04/08/18 0216 04/08/18 1626 04/09/18 0422 04/09/18 1102 04/10/18 0552 04/11/18 0415  NA 132* 135  133* 136 139 141  --  143 141  K 2.7* 3.0*  2.9* 3.5 3.2* 3.7  --  2.9* 2.9*  CL 95* 106  106 110 112* 110  --  110 103  CO2 22 17*  18* 19* 19* 22  --  27 28  GLUCOSE 223* 262*  258* 212* 161* 132*  --  109* 156*  BUN 10 12  11 13 12 11   --  16 18  CREATININE 0.78 0.83  0.82 0.85 0.76 0.65  --  0.67 0.51  CALCIUM 8.7* 7.5*  7.3* 7.1* 7.4* 7.7*  --  7.8* 7.7*  MG 1.3* 1.9 1.8  --   --   --  2.1 1.8  PHOS  --  2.0* 2.2*  --   --  2.7  --  1.7*   GFR: Estimated Creatinine Clearance: 68.9 mL/min (by C-G formula based on SCr of 0.51 mg/dL). Liver Function Tests: Recent Labs  Lab 04/07/18 2042 04/08/18 0216 04/08/18 1626 04/10/18 0552 04/11/18 0415  AST 11* 24 22 20  54*  ALT 12 16 16 17  35  ALKPHOS 64 59 54 50 91  BILITOT 1.8* 3.3* 4.2* 5.5* 7.9*  PROT 5.7* 5.7* 6.0* 5.9* 5.5*  ALBUMIN 2.6* 2.6* 2.5* 2.4* 2.1*   No results for input(s): LIPASE, AMYLASE in the last 168 hours. No results for input(s): AMMONIA in the last 168 hours. Coagulation Profile: Recent Labs  Lab 04/07/18 2042 04/10/18 0552  INR 1.6*  1.6*   Cardiac Enzymes: Recent Labs  Lab 04/10/18 0552  CKTOTAL 11*  CKMB 0.9   BNP (last 3 results) No results for input(s): PROBNP in the last 8760 hours. HbA1C: No results for input(s): HGBA1C in the last 72 hours. CBG: Recent Labs  Lab 04/10/18 1553 04/10/18 1945 04/10/18 2347 04/11/18 0404 04/11/18 0755  GLUCAP 171* 178* 126* 142* 155*   Lipid Profile: No results for input(s): CHOL, HDL, LDLCALC, TRIG, CHOLHDL, LDLDIRECT in the last 72 hours. Thyroid Function Tests: No results for input(s): TSH, T4TOTAL, FREET4, T3FREE, THYROIDAB in the last 72 hours. Anemia Panel: Recent Labs    04/08/18 1626  RETICCTPCT 1.6   Sepsis Labs: Recent  Labs  Lab 04/07/18 1626 04/07/18 2042 04/09/18 1715 04/10/18 0552 04/11/18 0415  PROCALCITON  --  14.25 69.16 46.84 23.39  LATICACIDVEN 1.9 1.0  --   --   --     Recent Results (from the past 240 hour(s))  Urine Culture     Status: Abnormal   Collection Time: 04/07/18 11:15 AM  Result Value Ref Range Status   Specimen Description   Final    URINE, CLEAN CATCH Performed at [facility], [place] [address] [place], [place] [address]    Special Requests   Final    NONE Performed at [facility], [place] [address], [place], [place] [address]    Culture >=100,000 COLONIES/mL KLEBSIELLA PNEUMONIAE (A)  Final   Report Status 04/10/2018 FINAL  Final   Organism ID, Bacteria KLEBSIELLA PNEUMONIAE (A)  Final      Susceptibility   Klebsiella pneumoniae - MIC*    AMPICILLIN >=32 RESISTANT Resistant     CEFAZOLIN <=4 SENSITIVE Sensitive     CEFTRIAXONE <=1 SENSITIVE Sensitive     CIPROFLOXACIN <=0.25 SENSITIVE Sensitive     GENTAMICIN <=1 SENSITIVE Sensitive     IMIPENEM <=0.25 SENSITIVE Sensitive     NITROFURANTOIN <=16 SENSITIVE Sensitive     TRIMETH/SULFA <=20 SENSITIVE Sensitive     AMPICILLIN/SULBACTAM 8 SENSITIVE Sensitive     PIP/TAZO <=4 SENSITIVE Sensitive     Extended ESBL  NEGATIVE Sensitive     * >=100,000 COLONIES/mL KLEBSIELLA PNEUMONIAE  Culture, Blood     Status: None (Preliminary result)   Collection Time: 04/07/18 11:27 AM  Result Value Ref Range Status   Specimen Description   Final    BLOOD RIGHT ARM Performed at [facility], [place] [address], [place], [place] [address]    Special Requests   Final    BOTTLES DRAWN AEROBIC AND ANAEROBIC Blood Culture results may not be optimal due to an excessive volume of blood received in culture bottles   Culture   Final    NO GROWTH 3 DAYS Performed at [facility], [place] [address], [place], [place] [address]    Report Status PENDING  Incomplete  Culture, Blood     Status: None (Preliminary result)   Collection Time: 04/07/18 12:15 PM  Result Value Ref Range Status   Specimen Description   Final    PORTA CATH Performed at [facility], [place] [address], [place], [place] [address]    Special Requests   Final    BOTTLES DRAWN AEROBIC AND ANAEROBIC Blood Culture adequate volume   Culture   Final    NO GROWTH 3 DAYS Performed at [facility], [place] [address], [place], [place] [address]    Report Status PENDING  Incomplete  MRSA PCR Screening     Status: None   Collection Time: 04/07/18  8:41 PM  Result Value Ref Range Status   MRSA by PCR NEGATIVE NEGATIVE Final    Comment:        The GeneXpert MRSA Assay (FDA approved for NASAL specimens only), is one component of a comprehensive MRSA colonization surveillance program. It is not intended to diagnose MRSA infection nor to guide or monitor treatment for MRSA infections. Performed at [facility], [place] [address], [place],  [address]   Gastrointestinal Panel by PCR , Stool     Status: None   Collection Time: 04/08/18  1:06 AM  Result Value Ref  Range Status   Campylobacter species NOT DETECTED NOT DETECTED Final   Plesimonas shigelloides NOT DETECTED NOT DETECTED  Final   Salmonella species NOT DETECTED NOT DETECTED Final   Yersinia enterocolitica NOT DETECTED NOT DETECTED Final   Vibrio species NOT DETECTED NOT DETECTED Final   Vibrio cholerae NOT DETECTED NOT DETECTED Final   Enteroaggregative E coli (EAEC) NOT DETECTED NOT DETECTED Final   Enteropathogenic E coli (EPEC) NOT DETECTED NOT DETECTED Final   Enterotoxigenic E coli (ETEC) NOT DETECTED NOT DETECTED Final   Shiga like toxin producing E coli (STEC) NOT DETECTED NOT DETECTED Final   Shigella/Enteroinvasive E coli (EIEC) NOT DETECTED NOT DETECTED Final   Cryptosporidium NOT DETECTED NOT DETECTED Final   Cyclospora cayetanensis NOT DETECTED NOT DETECTED Final   Entamoeba histolytica NOT DETECTED NOT DETECTED Final   Giardia lamblia NOT DETECTED NOT DETECTED Final   Adenovirus F40/41 NOT DETECTED NOT DETECTED Final   Astrovirus NOT DETECTED NOT DETECTED Final   Norovirus GI/GII NOT DETECTED NOT DETECTED Final   Rotavirus A NOT DETECTED NOT DETECTED Final   Sapovirus (I, II, IV, and V) NOT DETECTED NOT DETECTED Final    Comment: Performed at [facility], [place] [place], [place] [address]  Respiratory Panel by PCR     Status: None   Collection Time: 04/08/18  1:06 AM  Result Value Ref Range Status   Adenovirus NOT DETECTED NOT DETECTED Final   Coronavirus 229E NOT DETECTED NOT DETECTED Final    Comment: (NOTE) The Coronavirus on the Respiratory Panel, DOES NOT test for the novel  Coronavirus (2019 nCoV)    Coronavirus HKU1 NOT DETECTED NOT DETECTED Final   Coronavirus NL63 NOT DETECTED NOT DETECTED Final   Coronavirus OC43 NOT DETECTED NOT DETECTED Final   Metapneumovirus NOT DETECTED NOT DETECTED Final   Rhinovirus / Enterovirus NOT DETECTED NOT DETECTED Final   Influenza A NOT DETECTED NOT DETECTED Final   Influenza B NOT DETECTED NOT DETECTED Final   Parainfluenza Virus 1 NOT DETECTED NOT DETECTED Final   Parainfluenza Virus 2 NOT DETECTED NOT DETECTED Final    Parainfluenza Virus 3 NOT DETECTED NOT DETECTED Final   Parainfluenza Virus 4 NOT DETECTED NOT DETECTED Final   Respiratory Syncytial Virus NOT DETECTED NOT DETECTED Final   Bordetella pertussis NOT DETECTED NOT DETECTED Final   Chlamydophila pneumoniae NOT DETECTED NOT DETECTED Final   Mycoplasma pneumoniae NOT DETECTED NOT DETECTED Final    Comment: Performed at [facility], [place]. [address] [place], [place] [address]  C difficile quick scan w PCR reflex     Status: None   Collection Time: 04/08/18  2:30 PM  Result Value Ref Range Status   C Diff antigen NEGATIVE NEGATIVE Final   C Diff toxin NEGATIVE NEGATIVE Final   C Diff interpretation No C. difficile detected.  Final    Comment: Performed at [facility], [place] [address] [place], [place] [address]  Fungus culture, blood     Status: None (Preliminary result)   Collection Time: 04/09/18  5:15 PM  Result Value Ref Range Status   Specimen Description   Final    BLOOD RIGHT HAND Performed at [place] [address] [place], [place] [address]    Special Requests   Final    BOTTLES DRAWN AEROBIC AND ANAEROBIC Blood Culture adequate volume Performed at [place] [address], [place], [place] [address]    Culture   Final  NO GROWTH < 24 HOURS Performed at [place] [address], [place], [place] [address]    Report Status PENDING  Incomplete         Radiology Studies: Dg Chest Port 1 View  Result Date: 04/11/2018 CLINICAL DATA:  Respiratory failure EXAM: PORTABLE CHEST 1 VIEW COMPARISON:  Two days ago FINDINGS: Extensive airspace disease reflecting pneumonia by CT. There may be small pleural effusions. Worsening volumes and increasing density. Porta catheter with tip at the SVC. Borderline heart size. Calcified mediastinal lymph nodes. IMPRESSION: Extensive pneumonia with worsening aeration. Electronically Signed   By: Monte [name] M.D.    On: 04/11/2018 07:48   Dg Chest Port 1 View  Result Date: 04/09/2018 CLINICAL DATA:  Increasing shortness of breath EXAM: PORTABLE CHEST 1 VIEW COMPARISON:  04/07/2018 FINDINGS: Progressing consolidation and volume loss in the right upper lung since prior study. This likely represents progression of pneumonia. A centrally obstructing lesion is not excluded and follow-up after resolution is recommended. Calcified granulomas in the right lung and right hilum. Left lung is clear. Heart size and pulmonary vascularity are normal. No blunting of costophrenic angles. No pneumothorax. Power port type central venous catheter with tip over the low SVC region. IMPRESSION: Progressing consolidation and volume loss in the right upper lung since prior study. Electronically Signed   By: Lucienne Capers M.D.   On: 04/09/2018 20:43        Scheduled Meds: . chlorhexidine  15 mL Mouth Rinse BID  . Chlorhexidine Gluconate Cloth  6 each Topical Daily  . diltiazem  10 mg Intravenous Once  . feeding supplement  1 Container Oral TID BM  . insulin aspart  0-9 Units Subcutaneous Q4H  . mouth rinse  15 mL Mouth Rinse q12n4p  . phosphorus  250 mg Oral BID   Continuous Infusions: . dextrose 5 % and 0.9% NaCl Stopped (04/10/18 1355)  . meropenem (MERREM) IV Stopped (04/11/18 0606)  . potassium chloride 10 mEq (04/11/18 5102)     LOS: 3 days     Georgette Shell, MD [facility]  If 7PM-7AM, please contact night-coverage [email or web address] Password [facility] 04/11/2018, 9:55 AM

## 2018-04-11 NOTE — Progress Notes (Signed)
INFECTIOUS DISEASE PROGRESS NOTE  ID: Stacy Shelton is a 58 y.o. female with  Active Problems:   Absolute anemia   Metastatic breast cancer ([place])   Diarrhea   Sepsis ([place])   Hypokalemia   Hypomagnesemia   Acute lower UTI   Hypophosphatemia   Septic shock (HCC)   Malnutrition of moderate degree   Acute respiratory failure with hypoxia (HCC)   Acute cystitis without hematuria   Pneumonia of both lungs due to infectious organism  Subjective: Feels better, denies SOB.   Abtx:  Anti-infectives (From admission, onward)   Start     Dose/Rate Route Frequency Ordered Stop   04/09/18 2130  meropenem (MERREM) 1 g in sodium chloride 0.9 % 100 mL IVPB     1 g 200 mL/hr over 30 Minutes Intravenous Every 8 hours 04/09/18 2048     04/08/18 2100  vancomycin (VANCOCIN) 1,250 mg in sodium chloride 0.9 % 250 mL IVPB  Status:  Discontinued     1,250 mg 166.7 mL/hr over 90 Minutes Intravenous Every 24 hours 04/07/18 2000 04/10/18 1437   04/08/18 0200  metroNIDAZOLE (FLAGYL) IVPB 500 mg  Status:  Discontinued     500 mg 100 mL/hr over 60 Minutes Intravenous Every 8 hours 04/07/18 1933 04/09/18 2040   04/08/18 0100  ceFEPIme (MAXIPIME) 2 g in sodium chloride 0.9 % 100 mL IVPB  Status:  Discontinued     2 g 200 mL/hr over 30 Minutes Intravenous Every 8 hours 04/07/18 1958 04/09/18 2040   04/07/18 1715  vancomycin (VANCOCIN) 1,500 mg in sodium chloride 0.9 % 500 mL IVPB     1,500 mg 250 mL/hr over 120 Minutes Intravenous  Once 04/07/18 1636 04/07/18 2226   04/07/18 1630  ceFEPIme (MAXIPIME) 2 g in sodium chloride 0.9 % 100 mL IVPB     2 g 200 mL/hr over 30 Minutes Intravenous  Once 04/07/18 1626 04/07/18 1754   04/07/18 1630  metroNIDAZOLE (FLAGYL) IVPB 500 mg     500 mg 100 mL/hr over 60 Minutes Intravenous  Once 04/07/18 1626 04/07/18 1902   04/07/18 1630  vancomycin (VANCOCIN) IVPB 1000 mg/200 mL premix  Status:  Discontinued     1,000 mg 200 mL/hr over 60 Minutes Intravenous  Once  04/07/18 1626 04/07/18 1636      Medications:  Scheduled:  chlorhexidine  15 mL Mouth Rinse BID   Chlorhexidine Gluconate Cloth  6 each Topical Daily   diltiazem  10 mg Intravenous Once   feeding supplement  1 Container Oral TID BM   insulin aspart  0-9 Units Subcutaneous Q4H   mouth rinse  15 mL Mouth Rinse q12n4p   phosphorus  250 mg Oral BID    Objective: Vital signs in last 24 hours: Temp:  [98 F (36.7 C)-101.2 F (38.4 C)] 101.2 F (38.4 C) (03/13 0813) Pulse Rate:  [95-117] 108 (03/13 1000) Resp:  [28-50] 44 (03/13 1000) BP: (101-151)/(43-77) 138/66 (03/13 1000) SpO2:  [94 %-100 %] 96 % (03/13 1000)   General appearance: alert, cooperative, icteric and no distress Resp: rhonchi anterior - bilateral Chest wall: no tenderness, port with bruising, no fluctuance.  Cardio: regular rate and rhythm GI: normal findings: bowel sounds normal and soft, non-tender Extremities: edema none  Lab Results Recent Labs    04/10/18 0552 04/11/18 0415  WBC 6.6 10.2  HGB 7.1* 8.6*  HCT 22.6* 27.5*  NA 143 141  K 2.9* 2.9*  CL 110 103  CO2 27 28  BUN  16 18  CREATININE 0.67 0.51   Liver Panel Recent Labs    04/10/18 0552 04/11/18 0415  PROT 5.9* 5.5*  ALBUMIN 2.4* 2.1*  AST 20 54*  ALT 17 35  ALKPHOS 50 91  BILITOT 5.5* 7.9*   Sedimentation Rate No results for input(s): ESRSEDRATE in the last 72 hours. C-Reactive Protein No results for input(s): CRP in the last 72 hours.  Microbiology: Recent Results (from the past 240 hour(s))  Urine Culture     Status: Abnormal   Collection Time: 04/07/18 11:15 AM  Result Value Ref Range Status   Specimen Description   Final    URINE, CLEAN CATCH Performed at [facility], [address], [place], [place] [address]    Special Requests   Final    NONE Performed at [facility], [place] [address] [place], [place] [address]    Culture >=100,000 COLONIES/mL KLEBSIELLA  PNEUMONIAE (A)  Final   Report Status 04/10/2018 FINAL  Final   Organism ID, Bacteria KLEBSIELLA PNEUMONIAE (A)  Final      Susceptibility   Klebsiella pneumoniae - MIC*    AMPICILLIN >=32 RESISTANT Resistant     CEFAZOLIN <=4 SENSITIVE Sensitive     CEFTRIAXONE <=1 SENSITIVE Sensitive     CIPROFLOXACIN <=0.25 SENSITIVE Sensitive     GENTAMICIN <=1 SENSITIVE Sensitive     IMIPENEM <=0.25 SENSITIVE Sensitive     NITROFURANTOIN <=16 SENSITIVE Sensitive     TRIMETH/SULFA <=20 SENSITIVE Sensitive     AMPICILLIN/SULBACTAM 8 SENSITIVE Sensitive     PIP/TAZO <=4 SENSITIVE Sensitive     Extended ESBL NEGATIVE Sensitive     * >=100,000 COLONIES/mL KLEBSIELLA PNEUMONIAE  Culture, Blood     Status: None (Preliminary result)   Collection Time: 04/07/18 11:27 AM  Result Value Ref Range Status   Specimen Description   Final    BLOOD RIGHT ARM Performed at [facility], [place] [address], [place], [place] [address]    Special Requests   Final    BOTTLES DRAWN AEROBIC AND ANAEROBIC Blood Culture results may not be optimal due to an excessive volume of blood received in culture bottles   Culture   Final    NO GROWTH 3 DAYS Performed at [facility], [place] [address] [place], [place] [address]    Report Status PENDING  Incomplete  Culture, Blood     Status: None (Preliminary result)   Collection Time: 04/07/18 12:15 PM  Result Value Ref Range Status   Specimen Description   Final    PORTA CATH Performed at [facility], [place] [address], [place], [place] [address]    Special Requests   Final    BOTTLES DRAWN AEROBIC AND ANAEROBIC Blood Culture adequate volume   Culture   Final    NO GROWTH 3 DAYS Performed at [facility], [place] [address] [place], [place] [address]    Report Status PENDING  Incomplete  MRSA PCR Screening     Status: None   Collection Time: 04/07/18  8:41 PM  Result Value Ref Range Status   MRSA by PCR NEGATIVE  NEGATIVE Final    Comment:        The GeneXpert MRSA Assay (FDA approved for NASAL specimens only), is one component of a comprehensive MRSA colonization surveillance program. It is not intended to diagnose MRSA infection nor to guide or monitor treatment for MRSA infections. Performed at [facility], [place] [address] [place] [address] [place], [place] [address]   Gastrointestinal Panel by PCR , Stool     Status: None   Collection Time: 04/08/18  1:06 AM  Result Value Ref Range Status   Campylobacter species NOT DETECTED NOT DETECTED Final   Plesimonas shigelloides NOT DETECTED NOT DETECTED Final   Salmonella species NOT DETECTED NOT DETECTED Final   Yersinia enterocolitica NOT DETECTED NOT DETECTED Final   Vibrio species NOT DETECTED NOT DETECTED Final   Vibrio cholerae NOT DETECTED NOT DETECTED Final   Enteroaggregative E coli (EAEC) NOT DETECTED NOT DETECTED Final   Enteropathogenic E coli (EPEC) NOT DETECTED NOT DETECTED Final   Enterotoxigenic E coli (ETEC) NOT DETECTED NOT DETECTED Final   Shiga like toxin producing E coli (STEC) NOT DETECTED NOT DETECTED Final   Shigella/Enteroinvasive E coli (EIEC) NOT DETECTED NOT DETECTED Final   Cryptosporidium NOT DETECTED NOT DETECTED Final   Cyclospora cayetanensis NOT DETECTED NOT DETECTED Final   Entamoeba histolytica NOT DETECTED NOT DETECTED Final   Giardia lamblia NOT DETECTED NOT DETECTED Final   Adenovirus F40/41 NOT DETECTED NOT DETECTED Final   Astrovirus NOT DETECTED NOT DETECTED Final   Norovirus GI/GII NOT DETECTED NOT DETECTED Final   Rotavirus A NOT DETECTED NOT DETECTED Final   Sapovirus (I, II, IV, and V) NOT DETECTED NOT DETECTED Final    Comment: Performed at [facility], [place] [place], [place] [address]  Respiratory Panel by PCR     Status: None   Collection Time: 04/08/18  1:06 AM  Result Value Ref Range Status   Adenovirus NOT DETECTED NOT DETECTED Final   Coronavirus 229E NOT DETECTED  NOT DETECTED Final    Comment: (NOTE) The Coronavirus on the Respiratory Panel, DOES NOT test for the novel  Coronavirus (2019 nCoV)    Coronavirus HKU1 NOT DETECTED NOT DETECTED Final   Coronavirus NL63 NOT DETECTED NOT DETECTED Final   Coronavirus OC43 NOT DETECTED NOT DETECTED Final   Metapneumovirus NOT DETECTED NOT DETECTED Final   Rhinovirus / Enterovirus NOT DETECTED NOT DETECTED Final   Influenza A NOT DETECTED NOT DETECTED Final   Influenza B NOT DETECTED NOT DETECTED Final   Parainfluenza Virus 1 NOT DETECTED NOT DETECTED Final   Parainfluenza Virus 2 NOT DETECTED NOT DETECTED Final   Parainfluenza Virus 3 NOT DETECTED NOT DETECTED Final   Parainfluenza Virus 4 NOT DETECTED NOT DETECTED Final   Respiratory Syncytial Virus NOT DETECTED NOT DETECTED Final   Bordetella pertussis NOT DETECTED NOT DETECTED Final   Chlamydophila pneumoniae NOT DETECTED NOT DETECTED Final   Mycoplasma pneumoniae NOT DETECTED NOT DETECTED Final    Comment: Performed at [facility], [place], [place], [place] [address]  C difficile quick scan w PCR reflex     Status: None   Collection Time: 04/08/18  2:30 PM  Result Value Ref Range Status   C Diff antigen NEGATIVE NEGATIVE Final   C Diff toxin NEGATIVE NEGATIVE Final   C Diff interpretation No C. difficile detected.  Final    Comment: Performed at [facility], [place] [address] [place], [place] [address]  Fungus culture, blood     Status: None (Preliminary result)   Collection Time: 04/09/18  5:15 PM  Result Value Ref Range Status   Specimen Description   Final    BLOOD RIGHT HAND Performed at [place] [address] [place], [place] [address]    Special Requests  Final    BOTTLES DRAWN AEROBIC AND ANAEROBIC Blood Culture adequate volume Performed at [place] [address] [place], [place] [address]    Culture   Final    NO GROWTH < 24 HOURS Performed at [place] [address], [place], [place] [address]    Report Status PENDING  Incomplete    Studies/Results: Dg Chest Port 1 View  Result Date: 04/11/2018 CLINICAL DATA:  Respiratory failure EXAM: PORTABLE CHEST 1 VIEW COMPARISON:  Two days ago FINDINGS: Extensive airspace disease reflecting pneumonia by CT. There may be small pleural effusions. Worsening volumes and increasing density. Porta catheter with tip at the SVC. Borderline heart size. Calcified mediastinal lymph nodes. IMPRESSION: Extensive pneumonia with worsening aeration. Electronically Signed   By: Monte [name] M.D.   On: 04/11/2018 07:48   Dg Chest Port 1 View  Result Date: 04/09/2018 CLINICAL DATA:  Increasing shortness of breath EXAM: PORTABLE CHEST 1 VIEW COMPARISON:  04/07/2018 FINDINGS: Progressing consolidation and volume loss in the right upper lung since prior study. This likely represents progression of pneumonia. A centrally obstructing lesion is not excluded and follow-up after resolution is recommended. Calcified granulomas in the right lung and right hilum. Left lung is clear. Heart size and pulmonary vascularity are normal. No blunting of costophrenic angles. No pneumothorax. Power port type central venous catheter with tip over the low SVC region. IMPRESSION: Progressing consolidation and volume loss in the right upper lung since prior study. Electronically Signed   By: Lucienne Capers M.D.   On: 04/09/2018 20:43     Assessment/Plan: Fever, septic shock Multilobar pneumonia Metastatic breast cancer  Diarrhea Uncontrolled DM (A1C 12.9) Kleb pneumo UTI  Total days of antibiotics:3 merrem        Will change to ceftriaxone Would give her 8 days BCx ngtd She is much better available as needed.    Bobby Rumpf MD, FACP Infectious Diseases (pager) [phone] [email or web address] 04/11/2018, 11:12 AM  LOS: 3 days

## 2018-04-11 NOTE — Progress Notes (Signed)
I stopped by to see her this morning She is asleep. Family not present. Nursing staff reported no fever and oxygen requirement is less Hopefully she can be moved out of ICU I will stop by again this afternoon around 3 pm to check on her

## 2018-04-11 NOTE — Progress Notes (Signed)
These preliminary result these preliminary results were noted.  Awaiting final report.

## 2018-04-11 NOTE — Progress Notes (Signed)
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 Continue aggressive supportive care CT imaging shows stable disease. She is due for cycle 2 of chemotherapy next week.  It is unlikely she will recover to the point of resuming chemotherapy yet. Due to stability of her disease, I reassured the patient and family that we need to focus on supportive care first before discussion about resumption of treatment.  Recurrent fever, respiratory distress withCT imaging confirmed bilateral pneumonia Cultures are positive for Klebsiella She was seen by infectious disease team.  Her antibiotics are narrowed to ceftriaxone, total 8 days treatment  Progressive pancytopenia Likely due to bone marrow suppression from recent chemotherapy, on the background history of bone metastasis.  She also have signs of mild hemolysis, due to malignancy and chemotherapy I recommend transfusion support to keep hemoglobin greater than 8.  She does not need transfusion today.  She does not need platelet transfusion unless platelet count less than 10,000 or she has signs of bleeding  Poorly controlled diabetes She will continue insulin treatment  Multiple electrolyte imbalance Replace as needed  Progressive jaundice The elevated bilirubin could be due to cholestasis or sepsis CT imaging did not show biliary obstruction Hemolysis from recent transfuse blood is also a possibility Observe only Adjust medication as needed  CODE STATUS Full code  Discharge planning Anticipate she will be in the hospital for the next few days Overall, she is improving.  I suspect she will be moved out of ICU soon. I have significant discussion with the patient and family members about the plan of care and the role of multiple team members.  I recommend aggressive supportive measures as needed including  the possibility of CPR and intubation in this hospital stay I have addressed all her questions and concerns She is aware I will not be around this weekend.  I will see her again next week.  The team members have my number to call if questions arise  [name], MD 04/11/2018  3:30 PM   Subjective:  She is alert.  Nursing staff reported less oxygen requirement.  She had one episode of fever which has resolved since.  She is eating well.  She complained of mild edema  Objective:  Vitals:   04/11/18 1300 04/11/18 1314  BP: (!) 141/63   Pulse: (!) 107   Resp: (!) 34   Temp:  99.5 F (37.5 C)  SpO2: 96%      Intake/Output Summary (Last 24 hours) at 04/11/2018 1530 Last data filed at 04/11/2018 1500 Gross per 24 hour  Intake 962.41 ml  Output 300 ml  Net 662.41 ml    GENERAL:alert, no distress and comfortable.  She has oxygen delivered via nasal cannula.  She looks jaundiced SKIN: pale and jaundiced EYES: normal, Conjunctiva are pale and non-injected, sclera clear OROPHARYNX:no exudate, no erythema and lips, buccal mucosa, and tongue normal.  Dry mucous membrane noted NECK: supple, thyroid normal size, non-tender, without nodularity LYMPH:  no palpable lymphadenopathy in the cervical, axillary or inguinal LUNGS: Reduced breath sounds on both lung bases without wheezes HEART: Mild tachycardia, regular rate & rhythm and no murmurs with mild peripheral edema ABDOMEN:abdomen soft, non-tender and normal bowel sounds Musculoskeletal:no cyanosis of digits and no clubbing  NEURO: alert & oriented x 3 with fluent speech, no focal motor/sensory deficits   Labs:  Lab  Results  Component Value Date   WBC 10.2 04/11/2018   HGB 8.6 (L) 04/11/2018   HCT 27.5 (L) 04/11/2018   MCV 87.3 04/11/2018   PLT 46 (L) 04/11/2018   NEUTROABS 2.4 04/09/2018    Lab Results  Component Value Date   NA 141 04/11/2018   K 2.9 (L) 04/11/2018   CL 103 04/11/2018   CO2 28 04/11/2018    Studies:  Dg  Chest Port 1 View  Result Date: 04/11/2018 CLINICAL DATA:  Respiratory failure EXAM: PORTABLE CHEST 1 VIEW COMPARISON:  Two days ago FINDINGS: Extensive airspace disease reflecting pneumonia by CT. There may be small pleural effusions. Worsening volumes and increasing density. Porta catheter with tip at the SVC. Borderline heart size. Calcified mediastinal lymph nodes. IMPRESSION: Extensive pneumonia with worsening aeration. Electronically Signed   By: Monte [name] M.D.   On: 04/11/2018 07:48   Dg Chest Port 1 View  Result Date: 04/09/2018 CLINICAL DATA:  Increasing shortness of breath EXAM: PORTABLE CHEST 1 VIEW COMPARISON:  04/07/2018 FINDINGS: Progressing consolidation and volume loss in the right upper lung since prior study. This likely represents progression of pneumonia. A centrally obstructing lesion is not excluded and follow-up after resolution is recommended. Calcified granulomas in the right lung and right hilum. Left lung is clear. Heart size and pulmonary vascularity are normal. No blunting of costophrenic angles. No pneumothorax. Power port type central venous catheter with tip over the low SVC region. IMPRESSION: Progressing consolidation and volume loss in the right upper lung since prior study. Electronically Signed   By: Lucienne Capers M.D.   On: 04/09/2018 20:43

## 2018-04-11 NOTE — TOC Initial Note (Signed)
Transition of Care [facility]) - Initial/Assessment Note    Patient Details  Name: Stacy Shelton MRN: [id number] Date of Birth: [birth year]  [facility] CM/SW Contact:    Purcell Mouton, RN Phone Number: 04/11/2018, 10:37 AM  Clinical Narrative:     Pt with Metastatic breast cancer on chemo, was admitted from oncologist office due to fever of 101.4,  Persistent diarrhea, hypotension, electrolyte imbalance and severe anemia.                    Patient Goals and CMS Choice  Pt's goals are to get better.       Expected Discharge Plan and Services  Will continue to follow.        Expected Discharge Date: (unknown)                        Prior Living Arrangements/Services                       Activities of Daily Living Home Assistive Devices/Equipment: Eyeglasses ADL Screening (condition at time of admission) Patient's cognitive ability adequate to safely complete daily activities?: Yes Is the patient deaf or have difficulty hearing?: No Does the patient have difficulty seeing, even when wearing glasses/contacts?: No Does the patient have difficulty concentrating, remembering, or making decisions?: No Patient able to express need for assistance with ADLs?: Yes Does the patient have difficulty dressing or bathing?: No Independently performs ADLs?: Yes (appropriate for developmental age) Does the patient have difficulty walking or climbing stairs?: No Weakness of Legs: Both Weakness of Arms/Hands: Both  Permission Sought/Granted                  Emotional Assessment              Admission diagnosis:  Acute cystitis without hematuria [N30.00] Metastatic breast cancer ([place]) [C50.919] 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 Acute  cystitis without hematuria   . Pneumonia of both lungs due to infectious organism   . Malnutrition of moderate degree 04/09/2018  . Acute respiratory failure with hypoxia ([place])   . Septic shock ([place])   . Sepsis ([place]) 04/07/2018  . Hypokalemia 04/07/2018  . Hypomagnesemia 04/07/2018  . Acute lower UTI 04/07/2018  . Hypophosphatemia 04/07/2018  . Diarrhea 03/21/2018  . Metastatic breast cancer ([place]) 03/17/2018  . Metastasis to brain ([place]) 03/17/2018  . Metastasis to liver ([place]) 03/17/2018  . Metastasis to bone ([place]) 03/17/2018  . Pancytopenia, acquired ([place]) 03/17/2018  . Goals of care, counseling/discussion 03/17/2018  . Encounter for antineoplastic chemotherapy 03/17/2018  . Absolute anemia 03/11/2018  . Thrombocytopenia ([place]) 03/11/2018  . Uncontrolled diabetes mellitus with hyperglycemia ([place]) 03/11/2018  . Moderate persistent asthma 03/11/2018   PCP:  Robyne Peers, MD Pharmacy:   [id number] [place], [place]  [address] Phone: [phone] Fax: [phone]     Social Determinants of Health (SDOH) Interventions    Readmission Risk Interventions 30 Day Unplanned Readmission Risk Score     ED to Hosp-Admission (Current) from 04/07/2018 in [facility]-ICU/STEPDOWN  30 Day Unplanned Readmission Risk Score (%)  35 Filed at 04/11/2018 0801     This score is  the patient's risk of an unplanned readmission within 30 days of being discharged (0 -100%). The score is based on dignosis, age, lab data, medications, orders, and past utilization.   Low:  0-14.9   Medium: 15-21.9   High: 22-29.9   Extreme: 30 and above       No flowsheet data found.

## 2018-04-11 NOTE — Progress Notes (Signed)
NAME:  Stacy Shelton, MRN:  [id number], DOB:  [birth year], LOS: 3 ADMISSION DATE:  04/07/2018, CONSULTATION DATE:  04/07/18 REFERRING MD:  Roel Cluck, CHIEF COMPLAINT: hypotension  Brief History   58 yo WF widely metastatic breast cancer (to bone, liver) on chemo (last treatment 03/26/18) admitted 3/9 fever and acute onset of explosive diarrhea for the past 24-48h.  Hypotesnion on admit with Hgb 5.7, initially required vasopressors but weaned off after transfusion. Required O2 for saturations into the 80's.  Required intermittent BiPAP  Past Medical History  Metastatic Breast [facility] Events   3/09  Admit  3/10  Off pressors, intermittent bipap.  Febrile. Ongoing diarrhea.   Consults:  PCCM   Procedures:    Significant Diagnostic Tests:  CT Head / Chest / ABD / Pelvis 3/10 >> severe right lung multilobar pneumonia, multifocal early PNA in the left lung, small layering right effusion, no new metastatic disease, stable liver mets, evolution of pulmonary nodules / presumed pulmonary metastases visible with irregular / indistinct margins  Micro Data:  BCx2 3/9 (cancer center) >>  GI PCR 3/10 >> negative  C-Diff 3/10 >> negative  RVP 3/10 >> negative  04/08/2018 urine culture>> positive Klebsiella pneumonia sensitive to imipenem  Antimicrobials:  Vanco 3/9 >> off Cefepime 3/9 >> off Flagyl 3/9 >> off 04/09/2018 meropenem>>  Interim history/subjective:  Off BiPAP x24 hours.  Has a CPAP as she does at home.  O2 requirements are down hemodynamically stable.  Objective    Blood pressure (!) 109/46, pulse (!) 109, temperature (!) 101.4 F (38.6 C), resp. rate (!) 29, height 5\' 2"  (1.575 m), weight 67.1 kg, last menstrual period 10/30/2010, SpO2 97 %.  Exam: General: Much more awake and alert no acute distress not requiring noninvasive mechanical ventilatory support HEENT: No JVD or lymphadenopathy is appreciated Neuro: Intact CV: s1s2 rrr, no m/r/g PULM:  even/non-labored, lungs bilaterally decreased air movement FO:YDXA, non-tender, bsx4 active  Extremities: warm/dry, negative edema lower extremity Skin: no rashes or lesions    Labs    CBC: Recent Labs  Lab 04/07/18 1141  04/07/18 2042 04/08/18 0216 04/08/18 1626 04/09/18 0422 04/10/18 0552 04/11/18 0415  WBC 11.1*   < > 7.5 9.1 3.7* 3.8* 6.6 10.2  NEUTROABS 4.2  --  3.6  --  1.5* 2.4  --   --   HGB 7.2*   < > 5.7* 7.6* 8.1* 8.3* 7.1* 8.6*  HCT 22.9*  --  18.4* 24.0* 26.3* 26.4* 22.6* 27.5*  MCV 83.3  --  86.0 85.7 87.1 84.9 85.3 87.3  PLT 151   < > 109* 107* 74* 66* 58* 46*   < > = values in this interval not displayed.    Basic Metabolic Panel: Recent Labs  Lab 04/07/18 1143 04/07/18 2042 04/08/18 0216 04/08/18 1626 04/09/18 0422 04/09/18 1102 04/10/18 0552 04/11/18 0415  NA 132* 135  133* 136 139 141  --  143 141  K 2.7* 3.0*  2.9* 3.5 3.2* 3.7  --  2.9* 2.9*  CL 95* 106  106 110 112* 110  --  110 103  CO2 22 17*  18* 19* 19* 22  --  27 28  GLUCOSE 223* 262*  258* 212* 161* 132*  --  109* 156*  BUN 10 12  11 13 12 11   --  16 18  CREATININE 0.78 0.83  0.82 0.85 0.76 0.65  --  0.67 0.51  CALCIUM 8.7* 7.5*  7.3* 7.1* 7.4* 7.7*  --  7.8* 7.7*  MG 1.3* 1.9 1.8  --   --   --  2.1 1.8  PHOS  --  2.0* 2.2*  --   --  2.7  --  1.7*   GFR: Estimated Creatinine Clearance: 68.9 mL/min (by C-G formula based on SCr of 0.51 mg/dL). Recent Labs  Lab 04/07/18 1626 04/07/18 2042  04/08/18 1626 04/09/18 0422 04/09/18 1715 04/10/18 0552 04/11/18 0415  PROCALCITON  --  14.25  --   --   --  69.16 46.84 23.39  WBC  --  7.5   < > 3.7* 3.8*  --  6.6 10.2  LATICACIDVEN 1.9 1.0  --   --   --   --   --   --    < > = values in this interval not displayed.    Liver Function Tests: Recent Labs  Lab 04/07/18 2042 04/08/18 0216 04/08/18 1626 04/10/18 0552 04/11/18 0415  AST 11* 24 22 20  54*  ALT 12 16 16 17  35  ALKPHOS 64 59 54 50 91  BILITOT 1.8* 3.3* 4.2* 5.5*  7.9*  PROT 5.7* 5.7* 6.0* 5.9* 5.5*  ALBUMIN 2.6* 2.6* 2.5* 2.4* 2.1*   No results for input(s): LIPASE, AMYLASE in the last 168 hours. No results for input(s): AMMONIA in the last 168 hours.  ABG    Component Value Date/Time   PHART 7.321 (L) 04/08/2018 1547   PCO2ART 37.5 04/08/2018 1547   PO2ART 121 (H) 04/08/2018 1547   HCO3 18.2 (L) 04/08/2018 1547   ACIDBASEDEF 6.1 (H) 04/08/2018 1547   O2SAT 98.0 04/08/2018 1547     Coagulation Profile: Recent Labs  Lab 04/07/18 2042 04/10/18 0552  INR 1.6* 1.6*    Cardiac Enzymes: Recent Labs  Lab 04/10/18 0552  CKTOTAL 11*  CKMB 0.9    HbA1C: Hgb A1c MFr Bld  Date/Time Value Ref Range Status  04/08/2018 02:16 AM 7.5 (H) 4.8 - 5.6 % Final    Comment:    (NOTE) Pre diabetes:          5.7%-6.4% Diabetes:              >6.4% Glycemic control for   <7.0% adults with diabetes   03/10/2018 11:50 PM 12.9 (H) 4.8 - 5.6 % Final    Comment:    (NOTE) Pre diabetes:          5.7%-6.4% Diabetes:              >6.4% Glycemic control for   <7.0% adults with diabetes     CBG: Recent Labs  Lab 04/10/18 1553 04/10/18 1945 04/10/18 2347 04/11/18 0404 04/11/18 0755  GLUCAP 171* 178* 126* 142* 155*    Dg Chest Port 1 View  Result Date: 04/11/2018 CLINICAL DATA:  Respiratory failure EXAM: PORTABLE CHEST 1 VIEW COMPARISON:  Two days ago FINDINGS: Extensive airspace disease reflecting pneumonia by CT. There may be small pleural effusions. Worsening volumes and increasing density. Porta catheter with tip at the SVC. Borderline heart size. Calcified mediastinal lymph nodes. IMPRESSION: Extensive pneumonia with worsening aeration. Electronically Signed   By: Monte [name] M.D.   On: 04/11/2018 07:48   Dg Chest Port 1 View  Result Date: 04/09/2018 CLINICAL DATA:  Increasing shortness of breath EXAM: PORTABLE CHEST 1 VIEW COMPARISON:  04/07/2018 FINDINGS: Progressing consolidation and volume loss in the right upper lung since prior  study. This likely represents progression of pneumonia. A centrally obstructing lesion is not excluded and follow-up after resolution is recommended.  Calcified granulomas in the right lung and right hilum. Left lung is clear. Heart size and pulmonary vascularity are normal. No blunting of costophrenic angles. No pneumothorax. Power port type central venous catheter with tip over the low SVC region. IMPRESSION: Progressing consolidation and volume loss in the right upper lung since prior study. Electronically Signed   By: Lucienne Capers M.D.   On: 04/09/2018 20:43   Resolved Hospital Problem list     Assessment / Plan   Shock - resolved -multifactorial in setting of anemia, dense RLL PNA, hypovolemia Urinary tract infection with Klebsiella pneumonia -LVEF 03/21/18 60-65% P:  Shock resolved Remains on meropenem  Diarrhea  -viral panel negative P:  Continue to monitor for diarrhea  RLL PNA  Tachypnea / Acute Hypoxemic Respiratory Failure/' OSA P: She is improved with antimicrobial and diuresis therapy She is off BiPAP currently on CPAP for her obstructive sleep apnea Oxygen demands are down to 2 L with O2 sats 96% she could wean for sats greater than 94% Pulmonary critical care will be available PRN   Known RUL Opacity / Nodule -thought to be area of metastatic disease P: Continue to monitor  Metastatic Breast Cancer P:  Oncology is following  Anemia Recent Labs    04/10/18 0552 04/11/18 0415  HGB 7.1* 8.6*    -s/p 1 units PRBC 3/9 Pancytopenia  P:  Transfuse per protocol   Best practice:  Diet: clear liquid, per primary  Pain/Anxiety/Delirium protocol (if indicated):  VAP protocol (if indicated): n/a DVT prophylaxis: SCD  GI prophylaxis: n/a Glucose control: SSI Mobility: as tolerated  Code Status: FC.  Patient updated at the bedside 04/11/2018 Family Communication: Patient, mother and best friend updated at bedside 3/11.   Disposition: SDU 04/11/2018  pulmonary critical care will be available PRN   Critical care time:      [facility] Jinny Sweetland ACNP Maryanna Shape PCCM Pager [phone] till 1 pm If no answer page [phone] 04/11/2018, 10:54 AM

## 2018-04-12 ENCOUNTER — Other Ambulatory Visit: Payer: Self-pay

## 2018-04-12 ENCOUNTER — Inpatient Hospital Stay (HOSPITAL_COMMUNITY): Payer: BLUE CROSS/BLUE SHIELD

## 2018-04-12 LAB — CBC WITH DIFFERENTIAL/PLATELET
Abs Immature Granulocytes: 1.33 10*3/uL — ABNORMAL HIGH (ref 0.00–0.07)
Basophils Absolute: 0.4 10*3/uL — ABNORMAL HIGH (ref 0.0–0.1)
Basophils Relative: 4 %
Eosinophils Absolute: 0 10*3/uL (ref 0.0–0.5)
Eosinophils Relative: 0 %
HCT: 27 % — ABNORMAL LOW (ref 36.0–46.0)
Hemoglobin: 8.3 g/dL — ABNORMAL LOW (ref 12.0–15.0)
Immature Granulocytes: 15 %
Lymphocytes Relative: 21 %
Lymphs Abs: 1.9 10*3/uL (ref 0.7–4.0)
MCH: 27.3 pg (ref 26.0–34.0)
MCHC: 30.7 g/dL (ref 30.0–36.0)
MCV: 88.8 fL (ref 80.0–100.0)
Monocytes Absolute: 1.1 10*3/uL — ABNORMAL HIGH (ref 0.1–1.0)
Monocytes Relative: 12 %
NEUTROS PCT: 48 %
NRBC: 0.8 % — AB (ref 0.0–0.2)
Neutro Abs: 4.2 10*3/uL (ref 1.7–7.7)
PLATELETS: 33 10*3/uL — AB (ref 150–400)
RBC: 3.04 MIL/uL — ABNORMAL LOW (ref 3.87–5.11)
RDW: 20.4 % — ABNORMAL HIGH (ref 11.5–15.5)
WBC: 8.9 10*3/uL (ref 4.0–10.5)

## 2018-04-12 LAB — COMPREHENSIVE METABOLIC PANEL
ALT: 30 U/L (ref 0–44)
AST: 35 U/L (ref 15–41)
Albumin: 2.2 g/dL — ABNORMAL LOW (ref 3.5–5.0)
Alkaline Phosphatase: 201 U/L — ABNORMAL HIGH (ref 38–126)
Anion gap: 8 (ref 5–15)
BUN: 21 mg/dL — ABNORMAL HIGH (ref 6–20)
CALCIUM: 7.9 mg/dL — AB (ref 8.9–10.3)
CO2: 31 mmol/L (ref 22–32)
Chloride: 99 mmol/L (ref 98–111)
Creatinine, Ser: 0.45 mg/dL (ref 0.44–1.00)
GFR calc non Af Amer: >60 mL/min (ref >60)
Glucose, Bld: 235 mg/dL — ABNORMAL HIGH (ref 70–99)
Potassium: 3.3 mmol/L — ABNORMAL LOW (ref 3.5–5.1)
Sodium: 138 mmol/L (ref 135–145)
Total Bilirubin: 6.9 mg/dL — ABNORMAL HIGH (ref 0.3–1.2)
Total Protein: 5.2 g/dL — ABNORMAL LOW (ref 6.5–8.1)

## 2018-04-12 LAB — GLUCOSE, CAPILLARY
Glucose-Capillary: 212 mg/dL — ABNORMAL HIGH (ref 70–99)
Glucose-Capillary: 167 mg/dL — ABNORMAL HIGH (ref 70–99)
Glucose-Capillary: 225 mg/dL — ABNORMAL HIGH (ref 70–99)
Glucose-Capillary: 221 mg/dL — ABNORMAL HIGH (ref 70–99)
Glucose-Capillary: 239 mg/dL — ABNORMAL HIGH (ref 70–99)
Glucose-Capillary: 286 mg/dL — ABNORMAL HIGH (ref 70–99)

## 2018-04-12 LAB — CULTURE, BLOOD (SINGLE)
CULTURE: NO GROWTH
Culture: NO GROWTH
Special Requests: ADEQUATE

## 2018-04-12 LAB — MAGNESIUM: Magnesium: 2.1 mg/dL (ref 1.7–2.4)

## 2018-04-12 LAB — PHOSPHORUS: Phosphorus: 1.7 mg/dL — ABNORMAL LOW (ref 2.5–4.6)

## 2018-04-12 MED ORDER — POTASSIUM CHLORIDE CRYS ER 20 MEQ PO TBCR
40.0000 meq | EXTENDED_RELEASE_TABLET | Freq: Once | ORAL | Status: AC
  Administered 2018-04-12: 40 meq via ORAL
  Filled 2018-04-12: qty 2

## 2018-04-12 MED ORDER — POTASSIUM PHOSPHATES 15 MMOLE/5ML IV SOLN
15.0000 mmol | Freq: Once | INTRAVENOUS | Status: AC
  Administered 2018-04-12: 15 mmol via INTRAVENOUS
  Filled 2018-04-12: qty 5

## 2018-04-12 MED ORDER — MAGNESIUM SULFATE 2 GM/50ML IV SOLN: 2.0000 g | Freq: Once | INTRAVENOUS | Status: DC

## 2018-04-12 MED ORDER — METOPROLOL SUCCINATE ER 25 MG PO TB24
25.0000 mg | ORAL_TABLET | Freq: Every day | ORAL | Status: DC
  Administered 2018-04-12 – 2018-04-16 (×5): 25 mg via ORAL
  Filled 2018-04-12 (×5): qty 1

## 2018-04-12 MED ORDER — GUAIFENESIN-CODEINE 100-10 MG/5ML PO SOLN
5.0000 mL | Freq: Every day | ORAL | Status: DC
  Administered 2018-04-12 – 2018-04-15 (×4): 5 mL via ORAL
  Filled 2018-04-12 (×6): qty 5

## 2018-04-12 NOTE — Progress Notes (Signed)
Pt got up to bsc and went into a-fib rvr, pt is asymptomatic , b/p 136/80. EKG completed and pt converted back to sinus. Dr Zigmund Daniel paged

## 2018-04-12 NOTE — Progress Notes (Signed)
Patient stated that she did very will last night without the usage of the CPAP device.  Patient states that she does not want to wear this evening but will call if she changes her mind.

## 2018-04-12 NOTE — Progress Notes (Signed)
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   In the ED blood pressuresdid not respond to aggressive IV fluid hydration and was started on pressors. Blood cultures weredrawn andshewas started on broad-spectrum IV antibiotics. Transferedto stepdown unit for pressor support  Assessment & Plan:   Active Problems:   Absolute anemia   Metastatic breast cancer (HCC)   Diarrhea   Sepsis (HCC)   Hypokalemia   Hypomagnesemia   Acute lower UTI   Hypophosphatemia   Septic shock (HCC)   Malnutrition of moderate degree   Acute respiratory failure with hypoxia (HCC)   Acute cystitis without hematuria   Pneumonia of both lungs due to infectious organism   #1 sepsis/septic shock resolved patient off of pressors 04/08/2018. CT scan of the chestshow severe right lung multilobar pneumonia, multifocal early pneumonia in the left lung with associated small layering right pleural effusion.   Patient received meropenem now changed to Rocephin for a total of 8 days.  Rocephin was started 04/11/2018.  #2 respiratory distress secondary to bilateral pneumonia.  Patient slept without CPAP without oxygen overnight did well. When I saw her this morning she appeared very comfortable on CPAP and was sleeping deep.    Reviewed Dr. Golden Pop note.  Chest x-ray today shows stable multifocal patchy opacity and right upper lobe predominantly.  Patient has made significant improvement in the last few days.  We will transfer her to telemetry floor.  PT consult.  #3 pancytopenia probably  secondary to recent chemo versus sepsis. Hemoglobin this morning is 8.3 status post 1 unit of transfusion.Will transfuse to keep hemoglobin above 8.  #4 diastolic CHF stable  #5 type 2 diabetes uncontrolled hemoglobin A1c 12.9. Continue Levemir insulin 15 units daily. Her blood glucose has been stable here.  #6 metastatic breast cancer with mets to liver brain and bone.s/post recent chemo.  #7 sinus tachycardia proved.  DVT prophylaxis:SCD Code Status:Full code  family Communication:Family at bedside Disposition Plan:Pending clinical improvement  Consultants:  PCCM infectious disease cardiology oncology  Procedures:None Antimicrobials:meropenem  Nutrition Problem: Moderate Malnutrition Etiology: chronic illness, cancer and cancer related treatments     Signs/Symptoms: mild fat depletion, mild muscle depletion, percent weight loss Percent weight loss: 11 %    Interventions: Boost Breeze, MVI  Estimated body mass index is 27.07 kg/m as calculated from the following:   Height as of this encounter: 5\' 2"  (1.575 m).   Weight as of this encounter: 67.1 kg.    Subjective:  Patient resting comfortably on no oxygen no CPAP or BiPAP appears comfortable staff reports no new issues overnight Objective: Vitals:   04/12/18 0036 04/12/18 0514 04/12/18 0820 04/12/18 1115  BP:    (!) 124/54  Pulse:    94  Resp:    (!) 37  Temp:  98.5 F (36.9 C) 98.7 F (37.1 C)   TempSrc:  Oral Oral   SpO2: 91%   96%  Weight:      Height:        Intake/Output Summary (Last 24 hours) at 04/12/2018 1219 Last data filed at 04/12/2018 0900 Gross per 24 hour  Intake 365.88 ml  Output -  Net 365.88 ml   Filed Weights   04/07/18 1614  Weight: 67.1 kg    Examination:  General exam: Appears calm and comfortable  Respiratory system: Few scattered rhonchi otherwise clear to auscultation. Respiratory effort normal. Cardiovascular system: S1 & S2 heard, RRR. No JVD,  murmurs, rubs, gallops or clicks. No pedal edema. Gastrointestinal system: Abdomen is nondistended, soft and nontender. No organomegaly or masses felt. Normal bowel sounds heard. Central nervous system: Alert and oriented. No focal neurological deficits. Extremities: Symmetric 5 x 5 power. Skin: No rashes, lesions or ulcers Psychiatry: Judgement and insight appear normal. Mood & affect appropriate.     Data Reviewed: I have personally reviewed following labs and imaging studies  CBC: Recent Labs  Lab 04/07/18 1141 04/07/18 2042  04/08/18 1626 04/09/18 0422 04/10/18 0552 04/11/18 0415 04/12/18 0500  WBC 11.1* 7.5   < > 3.7* 3.8* 6.6 10.2 8.9  NEUTROABS 4.2 3.6  --  1.5* 2.4  --   --  4.2  HGB 7.2* 5.7*   < > 8.1* 8.3* 7.1* 8.6* 8.3*  HCT 22.9* 18.4*   < > 26.3* 26.4* 22.6* 27.5* 27.0*  MCV 83.3 86.0   < > 87.1 84.9 85.3 87.3 88.8  PLT 151 109*   < > 74* 66* 58* 46* 33*   < > = values in this interval not displayed.   Basic Metabolic Panel: Recent Labs  Lab 04/07/18 2042 04/08/18 0216 04/08/18 1626 04/09/18 0422 04/09/18 1102 04/10/18 0552 04/11/18 0415 04/12/18 0500  NA 135  133* 136 139 141  --  143 141 138  K 3.0*  2.9* 3.5 3.2* 3.7  --  2.9* 2.9* 3.3*  CL 106  106 110 112* 110  --  110 103 99  CO2 17*  18* 19* 19* 22  --  27 28 31   GLUCOSE 262*  258* 212* 161* 132*  --  109* 156* 235*  BUN 12  11 13 12 11   --  16 18 21*  CREATININE 0.83  0.82 0.85 0.76 0.65  --  0.67 0.51 0.45  CALCIUM 7.5*  7.3* 7.1* 7.4* 7.7*  --  7.8* 7.7* 7.9*  MG 1.9 1.8  --   --   --  2.1 1.8 2.1  PHOS 2.0* 2.2*  --   --  2.7  --  1.7* 1.7*   GFR: Estimated Creatinine Clearance: 68.9 mL/min (by C-G formula based on SCr of 0.45 mg/dL). Liver Function Tests: Recent Labs  Lab 04/08/18 0216 04/08/18 1626 04/10/18 0552 04/11/18 0415 04/12/18 0500  AST 24 22 20  54* 35  ALT 16 16 17  35 30  ALKPHOS 59 54 50 91 201*  BILITOT 3.3* 4.2* 5.5* 7.9* 6.9*  PROT 5.7* 6.0* 5.9* 5.5*  5.2*  ALBUMIN 2.6* 2.5* 2.4* 2.1* 2.2*   No results for input(s): LIPASE, AMYLASE in the last 168 hours. No results for input(s): AMMONIA in the last 168 hours. Coagulation Profile: Recent Labs  Lab 04/07/18 2042 04/10/18 0552  INR 1.6* 1.6*   Cardiac Enzymes: Recent Labs  Lab 04/10/18 0552  CKTOTAL 11*  CKMB 0.9   BNP (last 3 results) No results for input(s): PROBNP in the last 8760 hours. HbA1C: No results for input(s): HGBA1C in the last 72 hours. CBG: Recent Labs  Lab 04/11/18 1550 04/11/18 1938 04/11/18 2321 04/12/18 0512 04/12/18 0759  GLUCAP 137* 143* 226* 212* 167*   Lipid Profile: No results for input(s): CHOL, HDL, LDLCALC, TRIG, CHOLHDL, LDLDIRECT in the last 72  hours. Thyroid Function Tests: No results for input(s): TSH, T4TOTAL, FREET4, T3FREE, THYROIDAB in the last 72 hours. Anemia Panel: No results for input(s): VITAMINB12, FOLATE, FERRITIN, TIBC, IRON, RETICCTPCT in the last 72 hours. Sepsis Labs: Recent Labs  Lab 04/07/18 1626 04/07/18 2042 04/09/18 1715 04/10/18 0552 04/11/18 0415  PROCALCITON  --  14.25 69.16 46.84 23.39  LATICACIDVEN 1.9 1.0  --   --   --     Recent Results (from the past 240 hour(s))  Urine Culture     Status: Abnormal   Collection Time: 04/07/18 11:15 AM  Result Value Ref Range Status   Specimen Description   Final    URINE, CLEAN CATCH Performed at [facility], [place] [address] [place], [place] [address]    Special Requests   Final    NONE Performed at [facility], [place] [address] [place], [place] [address]    Culture >=100,000 COLONIES/mL KLEBSIELLA PNEUMONIAE (A)  Final   Report Status 04/10/2018 FINAL  Final   Organism ID, Bacteria KLEBSIELLA PNEUMONIAE (A)  Final      Susceptibility   Klebsiella pneumoniae - MIC*    AMPICILLIN >=32 RESISTANT Resistant     CEFAZOLIN <=4 SENSITIVE Sensitive     CEFTRIAXONE <=1 SENSITIVE Sensitive     CIPROFLOXACIN <=0.25  SENSITIVE Sensitive     GENTAMICIN <=1 SENSITIVE Sensitive     IMIPENEM <=0.25 SENSITIVE Sensitive     NITROFURANTOIN <=16 SENSITIVE Sensitive     TRIMETH/SULFA <=20 SENSITIVE Sensitive     AMPICILLIN/SULBACTAM 8 SENSITIVE Sensitive     PIP/TAZO <=4 SENSITIVE Sensitive     Extended ESBL NEGATIVE Sensitive     * >=100,000 COLONIES/mL KLEBSIELLA PNEUMONIAE  Culture, Blood     Status: None (Preliminary result)   Collection Time: 04/07/18 11:27 AM  Result Value Ref Range Status   Specimen Description   Final    BLOOD RIGHT ARM Performed at [facility], [place] [address] [place], [place] [address]    Special Requests   Final    BOTTLES DRAWN AEROBIC AND ANAEROBIC Blood Culture results may not be optimal due to an excessive volume of blood received in culture bottles   Culture   Final    NO GROWTH 4 DAYS Performed at [facility], [place] [address] [place], [place] [address]    Report Status PENDING  Incomplete  Culture, Blood     Status: None (Preliminary result)   Collection Time: 04/07/18 12:15 PM  Result Value Ref Range Status   Specimen Description   Final    PORTA CATH Performed at [facility], [place] [address] [place], [place] [address]    Special Requests   Final    BOTTLES DRAWN AEROBIC AND ANAEROBIC Blood Culture adequate volume   Culture   Final    NO GROWTH 4 DAYS Performed at [facility], [place] [address], [place], [place] [address]    Report Status PENDING  Incomplete  MRSA PCR Screening     Status: None   Collection Time: 04/07/18  8:41 PM  Result Value Ref Range Status   MRSA by PCR NEGATIVE NEGATIVE Final    Comment:        The GeneXpert MRSA Assay (FDA approved for NASAL specimens only), is one component of a comprehensive MRSA colonization surveillance program. It is not intended to diagnose MRSA infection nor to guide or monitor treatment for MRSA infections. Performed  at [facility], [place] [address] [place], [place] [address]   Gastrointestinal Panel by PCR , Stool     Status: None   Collection Time: 04/08/18  1:06 AM  Result Value Ref Range Status   Campylobacter species NOT DETECTED NOT DETECTED Final   Plesimonas shigelloides NOT DETECTED NOT DETECTED Final   Salmonella species NOT DETECTED NOT DETECTED Final   Yersinia enterocolitica NOT DETECTED NOT DETECTED Final   Vibrio species NOT DETECTED NOT DETECTED Final   Vibrio cholerae NOT DETECTED NOT DETECTED Final   Enteroaggregative E coli (EAEC) NOT DETECTED NOT DETECTED Final   Enteropathogenic E coli (EPEC) NOT DETECTED NOT DETECTED Final   Enterotoxigenic E coli (ETEC) NOT DETECTED NOT DETECTED Final   Shiga like toxin producing E coli (STEC) NOT DETECTED NOT DETECTED Final   Shigella/Enteroinvasive E coli (EIEC) NOT DETECTED NOT DETECTED Final   Cryptosporidium NOT DETECTED NOT DETECTED Final   Cyclospora cayetanensis NOT DETECTED NOT DETECTED Final   Entamoeba histolytica NOT DETECTED NOT DETECTED Final   Giardia lamblia NOT DETECTED NOT DETECTED Final   Adenovirus F40/41 NOT DETECTED NOT DETECTED Final   Astrovirus NOT DETECTED NOT DETECTED Final   Norovirus GI/GII NOT DETECTED NOT DETECTED Final   Rotavirus A NOT DETECTED NOT DETECTED Final   Sapovirus (I, II, IV, and V) NOT DETECTED NOT DETECTED Final    Comment: Performed at [facility], [place]., [place], [place] [address]  Respiratory Panel by PCR     Status: None   Collection Time: 04/08/18  1:06 AM  Result Value Ref Range Status   Adenovirus NOT DETECTED NOT DETECTED Final   Coronavirus 229E NOT DETECTED NOT DETECTED Final    Comment: (NOTE) The Coronavirus on the Respiratory Panel, DOES NOT test for the novel  Coronavirus (2019 nCoV)    Coronavirus HKU1 NOT DETECTED NOT DETECTED Final   Coronavirus NL63 NOT DETECTED NOT DETECTED Final   Coronavirus OC43 NOT DETECTED NOT DETECTED Final   Metapneumovirus NOT  DETECTED NOT DETECTED Final   Rhinovirus / Enterovirus NOT DETECTED NOT DETECTED Final   Influenza A NOT DETECTED NOT DETECTED Final   Influenza B NOT DETECTED NOT DETECTED Final   Parainfluenza Virus 1 NOT DETECTED NOT DETECTED Final   Parainfluenza Virus 2 NOT DETECTED NOT DETECTED Final   Parainfluenza Virus 3 NOT DETECTED NOT DETECTED Final   Parainfluenza Virus 4 NOT DETECTED NOT DETECTED Final   Respiratory Syncytial Virus NOT DETECTED NOT DETECTED Final   Bordetella pertussis NOT DETECTED NOT DETECTED Final   Chlamydophila pneumoniae NOT DETECTED NOT DETECTED Final   Mycoplasma pneumoniae NOT DETECTED NOT DETECTED Final    Comment: Performed at [facility], [place]. [address] [place], [place] [address]  C difficile quick scan w PCR reflex     Status: None   Collection Time: 04/08/18  2:30 PM  Result Value Ref Range Status   C Diff antigen NEGATIVE NEGATIVE Final   C Diff toxin NEGATIVE NEGATIVE Final   C Diff interpretation No C. difficile detected.  Final    Comment: Performed at [facility], [place] [address], [place], [place] [address]  Fungus culture, blood     Status: None (Preliminary result)   Collection Time: 04/09/18  5:15 PM  Result Value Ref Range Status   Specimen Description   Final    BLOOD RIGHT HAND Performed at [employer] [address], [place], [place] [address]  Special Requests   Final    BOTTLES DRAWN AEROBIC AND ANAEROBIC Blood Culture adequate volume Performed at [facility] [address], [place], [place] [address]    Culture   Final    NO GROWTH 2 DAYS Performed at [place] [address] [place], [place] [address]    Report Status PENDING  Incomplete         Radiology Studies: Dg Chest Port 1 View  Result Date: 04/12/2018 CLINICAL DATA:  Respiratory failure EXAM: PORTABLE CHEST 1 VIEW COMPARISON:  04/11/2018 FINDINGS: Multifocal opacities, right upper lobe and  left lower lobe predominant, stable versus mildly improved. No definite pleural effusions. No pneumothorax. The heart is normal in size. Right chest power port terminates in the lower SVC. IMPRESSION: Stable multifocal patchy opacity/pneumonia, right upper lobe predominant. Electronically Signed   By: Julian Hy M.D.   On: 04/12/2018 05:22   Dg Chest Port 1 View  Result Date: 04/11/2018 CLINICAL DATA:  Respiratory failure EXAM: PORTABLE CHEST 1 VIEW COMPARISON:  Two days ago FINDINGS: Extensive airspace disease reflecting pneumonia by CT. There may be small pleural effusions. Worsening volumes and increasing density. Porta catheter with tip at the SVC. Borderline heart size. Calcified mediastinal lymph nodes. IMPRESSION: Extensive pneumonia with worsening aeration. Electronically Signed   By: Monte [name] M.D.   On: 04/11/2018 07:48        Scheduled Meds: . chlorhexidine  15 mL Mouth Rinse BID  . Chlorhexidine Gluconate Cloth  6 each Topical Daily  . diltiazem  10 mg Intravenous Once  . feeding supplement  1 Container Oral TID BM  . guaiFENesin-codeine  5 mL Oral QHS  . insulin aspart  0-9 Units Subcutaneous Q4H  . mouth rinse  15 mL Mouth Rinse q12n4p  . phosphorus  250 mg Oral BID   Continuous Infusions: . cefTRIAXone (ROCEPHIN)  IV Stopped (04/11/18 1447)     LOS: 4 days     Georgette Shell, MD [facility]  If 7PM-7AM, please contact night-coverage [email or web address] Password [facility] 04/12/2018, 12:19 PM

## 2018-04-12 NOTE — Progress Notes (Signed)
   Patient not on active rounding list for PCCM but this PCCM MD saw news report -> [email or web address]   Patient is employee of [employer] - post breast cancer chem  MD called friend who MD met 3 d ago at bedside and knows patient details and listed in demographic - [place]  - Patient last day of working at [employer] was 03/07/2018 -  And 03/09/2018 driven to ER and made acute dx of breast cancer - 03/14/2018 - sister from [place] visited patient x 2 weeks. As of Wednesday 3/11/29020 this sister well - 04/07/2018 - patient admitted with resp failure and diarrhes asepsis with High PCT. RVP neg, Stool PCR neg. Urine culture grew Klebsiella (post chemo) 04/12/2018 - course improvement  A/P Very very very  low concern for COVID  P D/w DR Megan Salon - no need for covid testing     SIGNATURE    Dr. Brand Males, M.D., F.C.C.P,  Pulmonary and Critical Care Medicine Staff Physician, [place] Director - Interstitial Lung Disease  Program  [facility] [place] [facility] [place], [place], [address]  Pager: [phone], If no answer or between  15:00h - 7:00h: call [phone] Telephone: [phone]  10:29 AM 04/12/2018

## 2018-04-13 LAB — COMPREHENSIVE METABOLIC PANEL
ALT: 29 U/L (ref 0–44)
ANION GAP: 9 (ref 5–15)
AST: 30 U/L (ref 15–41)
Albumin: 2.1 g/dL — ABNORMAL LOW (ref 3.5–5.0)
Alkaline Phosphatase: 291 U/L — ABNORMAL HIGH (ref 38–126)
BUN: 18 mg/dL (ref 6–20)
CO2: 29 mmol/L (ref 22–32)
Calcium: 7.8 mg/dL — ABNORMAL LOW (ref 8.9–10.3)
Chloride: 102 mmol/L (ref 98–111)
Creatinine, Ser: 0.49 mg/dL (ref 0.44–1.00)
GFR calc Af Amer: >60 mL/min (ref >60)
GFR calc non Af Amer: >60 mL/min (ref >60)
Glucose, Bld: 175 mg/dL — ABNORMAL HIGH (ref 70–99)
POTASSIUM: 3.6 mmol/L (ref 3.5–5.1)
Sodium: 140 mmol/L (ref 135–145)
Total Bilirubin: 4.2 mg/dL — ABNORMAL HIGH (ref 0.3–1.2)
Total Protein: 5.2 g/dL — ABNORMAL LOW (ref 6.5–8.1)

## 2018-04-13 LAB — GLUCOSE, CAPILLARY
Glucose-Capillary: 148 mg/dL — ABNORMAL HIGH (ref 70–99)
Glucose-Capillary: 149 mg/dL — ABNORMAL HIGH (ref 70–99)
Glucose-Capillary: 256 mg/dL — ABNORMAL HIGH (ref 70–99)
Glucose-Capillary: 217 mg/dL — ABNORMAL HIGH (ref 70–99)
Glucose-Capillary: 141 mg/dL — ABNORMAL HIGH (ref 70–99)
Glucose-Capillary: 177 mg/dL — ABNORMAL HIGH (ref 70–99)

## 2018-04-13 LAB — CBC
HCT: 26 % — ABNORMAL LOW (ref 36.0–46.0)
Hemoglobin: 8.1 g/dL — ABNORMAL LOW (ref 12.0–15.0)
MCH: 27.4 pg (ref 26.0–34.0)
MCHC: 31.2 g/dL (ref 30.0–36.0)
MCV: 87.8 fL (ref 80.0–100.0)
Platelets: 32 10*3/uL — ABNORMAL LOW (ref 150–400)
RBC: 2.96 MIL/uL — ABNORMAL LOW (ref 3.87–5.11)
RDW: 21 % — ABNORMAL HIGH (ref 11.5–15.5)
WBC: 8.2 10*3/uL (ref 4.0–10.5)
nRBC: 0.7 % — ABNORMAL HIGH (ref 0.0–0.2)

## 2018-04-13 LAB — PHOSPHORUS: Phosphorus: 2.6 mg/dL (ref 2.5–4.6)

## 2018-04-13 MED ORDER — GUAIFENESIN-CODEINE 100-10 MG/5ML PO SOLN
5.0000 mL | ORAL | Status: DC | PRN
  Administered 2018-04-14: 5 mL via ORAL
  Filled 2018-04-13: qty 5

## 2018-04-13 MED ORDER — PANTOPRAZOLE SODIUM 40 MG PO TBEC
40.0000 mg | DELAYED_RELEASE_TABLET | Freq: Two times a day (BID) | ORAL | Status: DC
  Administered 2018-04-13 – 2018-04-16 (×8): 40 mg via ORAL
  Filled 2018-04-13 (×8): qty 1

## 2018-04-13 MED ORDER — ZOLPIDEM TARTRATE 5 MG PO TABS
5.0000 mg | ORAL_TABLET | Freq: Every evening | ORAL | Status: DC | PRN
  Administered 2018-04-13 – 2018-04-15 (×3): 5 mg via ORAL
  Filled 2018-04-13 (×3): qty 1

## 2018-04-13 MED ORDER — GUAIFENESIN 100 MG/5ML PO SOLN
5.0000 mL | Freq: Once | ORAL | Status: AC
  Administered 2018-04-13: 100 mg via ORAL
  Filled 2018-04-13: qty 10

## 2018-04-13 MED ORDER — MONTELUKAST SODIUM 10 MG PO TABS
10.0000 mg | ORAL_TABLET | Freq: Every day | ORAL | Status: DC
  Administered 2018-04-13 – 2018-04-15 (×4): 10 mg via ORAL
  Filled 2018-04-13 (×4): qty 1

## 2018-04-13 NOTE — Progress Notes (Signed)
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   In the ED blood pressuresdid not respond to aggressive IV fluid hydration and was started on pressors. Blood cultures weredrawn andshewas started on broad-spectrum IV antibiotics. Transferedto stepdown unit for pressor support  Assessment & Plan:   Active Problems:   Absolute anemia   Metastatic breast cancer (HCC)   Diarrhea   Sepsis (HCC)   Hypokalemia   Hypomagnesemia   Acute lower UTI   Hypophosphatemia   Septic shock (HCC)   Malnutrition of moderate degree   Acute respiratory failure with hypoxia (HCC)   Acute cystitis without hematuria   Pneumonia of both lungs due to infectious organism    #1 sepsis/septic shock resolved patient off of pressors 04/08/2018. CT scan of the chestshow severe right lung multilobar pneumonia, multifocal early pneumonia in the left lung with associated small layering right pleural effusion.  Patient received meropenem now changed to Rocephin for a total of 8 days.  Rocephin was started 04/11/2018.  #2 respiratory distresssecondary to bilateral pneumonia. Patient slept without CPAP without oxygen overnight did well. Chest x-ray 3/14 shows stable multifocal patchy opacity and right upper lobe predominantly.  Patient has made significant improvement in the last few days.  We will transfer her to telemetry floor.  PT consult pending.  #3 pancytopenia probably secondary to recent chemo versus sepsis. Hemoglobin this morning is8.2 status post 1 unit of transfusion.Will  transfuse to keep hemoglobin above 8.  #4 diastolic CHF stable  #5 type 2 diabetes uncontrolled hemoglobin A1c 12.9. Continue Levemir insulin 15 units daily. Her blood glucose has been stable here.  #6 metastatic breast cancer with mets to liver brain and bone.s/post recent chemo.  #7 sinus tachycardiaresolved   DVT prophylaxis:SCD Code Status:Full code  family Communication:Family at bedside Disposition Plan:Pending clinical improvement  Consultants:  PCCM infectious disease cardiology oncology  Procedures:None Antimicrobials:meropenem  Nutrition Problem: Moderate Malnutrition Etiology: chronic illness, cancer and cancer related treatments     Signs/Symptoms: mild fat depletion, mild muscle depletion, percent weight loss Percent weight loss: 11 %    Interventions: Boost Breeze, MVI  Estimated body mass index is 27.07 kg/m as calculated from the following:   Height as of this encounter: 5\' 2"  (1.575 m).   Weight as of this encounter: 67.1 kg.   Subjective:  Feels better starts coughing when talking slept ok without cpap or oxygen Objective: Vitals:   04/13/18 0505 04/13/18 0514 04/13/18 0800 04/13/18 0845  BP:   (!) 123/49   Pulse: 91 98 93   Resp: (!) 35  (!) 33   Temp:  98.4 F (36.9 C)  98.4 F (36.9 C)  TempSrc:  Oral  Oral  SpO2: 94%  93%   Weight:      Height:        Intake/Output Summary (Last 24 hours) at 04/13/2018 0939 Last data filed at 04/13/2018 0800 Gross per 24 hour  Intake 434.4 ml  Output 700 ml  Net -265.6 ml   Filed Weights   04/07/18 1614  Weight: 67.1 kg    Examination:  General exam: Appears calm and comfortable  Respiratory system: few  rhonchi  to auscultation. Respiratory effort normal. Cardiovascular system: S1 & S2 heard, RRR. No JVD, murmurs, rubs, gallops or clicks. No pedal edema. Gastrointestinal system: Abdomen is nondistended, soft and nontender. No organomegaly or masses felt. Normal bowel  sounds heard. Central nervous system: Alert and oriented. No focal neurological deficits. Extremities: Symmetric 5 x 5 power. Skin: No rashes, lesions or ulcers Psychiatry: Judgement and insight appear normal. Mood & affect appropriate.     Data Reviewed: I have personally reviewed following labs and imaging studies  CBC: Recent Labs  Lab 04/07/18 1141 04/07/18 2042  04/08/18 1626 04/09/18 0422 04/10/18 0552 04/11/18 0415 04/12/18 0500 04/13/18 0500  WBC 11.1* 7.5   < > 3.7* 3.8* 6.6 10.2 8.9 8.2  NEUTROABS 4.2 3.6  --  1.5* 2.4  --   --  4.2  --   HGB 7.2* 5.7*   < > 8.1* 8.3* 7.1* 8.6* 8.3* 8.1*  HCT 22.9* 18.4*   < > 26.3* 26.4* 22.6* 27.5* 27.0* 26.0*  MCV 83.3 86.0   < > 87.1 84.9 85.3 87.3 88.8 87.8  PLT 151 109*   < > 74* 66* 58* 46* 33* 32*   < > = values in this interval not displayed.   Basic Metabolic Panel: Recent Labs  Lab 04/07/18 2042 04/08/18 0216  04/09/18 0422 04/09/18 1102 04/10/18 0552 04/11/18 0415 04/12/18 0500 04/13/18 0500  NA 135  133* 136   < > 141  --  143 141 138 140  K 3.0*  2.9* 3.5   < > 3.7  --  2.9* 2.9* 3.3* 3.6  CL 106  106 110   < > 110  --  110 103 99 102  CO2 17*  18* 19*   < > 22  --  27 28 31 29   GLUCOSE 262*  258* 212*   < > 132*  --  109* 156* 235* 175*  BUN 12  11 13    < > 11  --  16 18 21* 18  CREATININE 0.83  0.82 0.85   < > 0.65  --  0.67 0.51 0.45 0.49  CALCIUM 7.5*  7.3* 7.1*   < > 7.7*  --  7.8* 7.7* 7.9* 7.8*  MG 1.9 1.8  --   --   --  2.1 1.8 2.1  --   PHOS 2.0* 2.2*  --   --  2.7  --  1.7* 1.7* 2.6   < > = values in this interval not displayed.   GFR: Estimated Creatinine Clearance: 68.9 mL/min (by C-G formula based on SCr of 0.49 mg/dL). Liver Function Tests: Recent Labs  Lab 04/08/18 1626 04/10/18 0552 04/11/18 0415 04/12/18 0500 04/13/18 0500  AST 22 20 54* 35 30  ALT 16 17 35 30 29  ALKPHOS 54 50 91 201* 291*  BILITOT 4.2* 5.5* 7.9* 6.9* 4.2*  PROT 6.0* 5.9* 5.5* 5.2* 5.2*  ALBUMIN 2.5*  2.4* 2.1* 2.2* 2.1*   No results for input(s): LIPASE, AMYLASE in the last 168 hours. No results for input(s): AMMONIA in the last 168 hours. Coagulation Profile: Recent Labs  Lab 04/07/18 2042 04/10/18 0552  INR 1.6* 1.6*   Cardiac Enzymes: Recent Labs  Lab 04/10/18 0552  CKTOTAL 11*  CKMB 0.9   BNP (last 3 results) No results for input(s): PROBNP in the last 8760 hours. HbA1C: No results for input(s): HGBA1C in the last 72 hours. CBG: Recent Labs  Lab 04/12/18 1549 04/12/18 1939 04/12/18 2306 04/13/18 0511 04/13/18  0803  GLUCAP 221* 239* 286* 148* 149*   Lipid Profile: No results for input(s): CHOL, HDL, LDLCALC, TRIG, CHOLHDL, LDLDIRECT in the last 72 hours. Thyroid Function Tests: No results for input(s): TSH, T4TOTAL, FREET4, T3FREE, THYROIDAB in the last 72 hours. Anemia Panel: No results for input(s): VITAMINB12, FOLATE, FERRITIN, TIBC, IRON, RETICCTPCT in the last 72 hours. Sepsis Labs: Recent Labs  Lab 04/07/18 1626 04/07/18 2042 04/09/18 1715 04/10/18 0552 04/11/18 0415  PROCALCITON  --  14.25 69.16 46.84 23.39  LATICACIDVEN 1.9 1.0  --   --   --     Recent Results (from the past 240 hour(s))  Urine Culture     Status: Abnormal   Collection Time: 04/07/18 11:15 AM  Result Value Ref Range Status   Specimen Description   Final    URINE, CLEAN CATCH Performed at [facility], [place] [address], [place], [place] [address]    Special Requests   Final    NONE Performed at [facility], [place] [address] [place], [place] [address]    Culture >=100,000 COLONIES/mL KLEBSIELLA PNEUMONIAE (A)  Final   Report Status 04/10/2018 FINAL  Final   Organism ID, Bacteria KLEBSIELLA PNEUMONIAE (A)  Final      Susceptibility   Klebsiella pneumoniae - MIC*    AMPICILLIN >=32 RESISTANT Resistant     CEFAZOLIN <=4 SENSITIVE Sensitive     CEFTRIAXONE <=1 SENSITIVE Sensitive     CIPROFLOXACIN <=0.25 SENSITIVE Sensitive      GENTAMICIN <=1 SENSITIVE Sensitive     IMIPENEM <=0.25 SENSITIVE Sensitive     NITROFURANTOIN <=16 SENSITIVE Sensitive     TRIMETH/SULFA <=20 SENSITIVE Sensitive     AMPICILLIN/SULBACTAM 8 SENSITIVE Sensitive     PIP/TAZO <=4 SENSITIVE Sensitive     Extended ESBL NEGATIVE Sensitive     * >=100,000 COLONIES/mL KLEBSIELLA PNEUMONIAE  Culture, Blood     Status: None   Collection Time: 04/07/18 11:27 AM  Result Value Ref Range Status   Specimen Description   Final    BLOOD RIGHT ARM Performed at [facility], [place] [address] [place], [place] [address]    Special Requests   Final    BOTTLES DRAWN AEROBIC AND ANAEROBIC Blood Culture results may not be optimal due to an excessive volume of blood received in culture bottles   Culture   Final    NO GROWTH 5 DAYS Performed at [facility], [place] [address], [place], [place] [address]    Report Status 04/12/2018 FINAL  Final  Culture, Blood     Status: None   Collection Time: 04/07/18 12:15 PM  Result Value Ref Range Status   Specimen Description   Final    PORTA CATH Performed at [facility], [place] [address] [place], [place] [address]    Special Requests   Final    BOTTLES DRAWN AEROBIC AND ANAEROBIC Blood Culture adequate volume   Culture   Final    NO GROWTH 5 DAYS Performed at [facility], [place] [address], [place], [place] [address]    Report Status 04/12/2018 FINAL  Final  MRSA PCR Screening     Status: None   Collection Time: 04/07/18  8:41 PM  Result Value Ref Range Status   MRSA by PCR NEGATIVE NEGATIVE Final    Comment:        The GeneXpert MRSA Assay (FDA approved for NASAL specimens only), is one component of  a comprehensive MRSA colonization surveillance program. It is not intended to diagnose MRSA infection nor to guide or monitor treatment for MRSA infections. Performed at [facility], [place] [address] [place], [place] [address]    Gastrointestinal Panel by PCR , Stool     Status: None   Collection Time: 04/08/18  1:06 AM  Result Value Ref Range Status   Campylobacter species NOT DETECTED NOT DETECTED Final   Plesimonas shigelloides NOT DETECTED NOT DETECTED Final   Salmonella species NOT DETECTED NOT DETECTED Final   Yersinia enterocolitica NOT DETECTED NOT DETECTED Final   Vibrio species NOT DETECTED NOT DETECTED Final   Vibrio cholerae NOT DETECTED NOT DETECTED Final   Enteroaggregative E coli (EAEC) NOT DETECTED NOT DETECTED Final   Enteropathogenic E coli (EPEC) NOT DETECTED NOT DETECTED Final   Enterotoxigenic E coli (ETEC) NOT DETECTED NOT DETECTED Final   Shiga like toxin producing E coli (STEC) NOT DETECTED NOT DETECTED Final   Shigella/Enteroinvasive E coli (EIEC) NOT DETECTED NOT DETECTED Final   Cryptosporidium NOT DETECTED NOT DETECTED Final   Cyclospora cayetanensis NOT DETECTED NOT DETECTED Final   Entamoeba histolytica NOT DETECTED NOT DETECTED Final   Giardia lamblia NOT DETECTED NOT DETECTED Final   Adenovirus F40/41 NOT DETECTED NOT DETECTED Final   Astrovirus NOT DETECTED NOT DETECTED Final   Norovirus GI/GII NOT DETECTED NOT DETECTED Final   Rotavirus A NOT DETECTED NOT DETECTED Final   Sapovirus (I, II, IV, and V) NOT DETECTED NOT DETECTED Final    Comment: Performed at [facility], [place] [place], [place] [address]  Respiratory Panel by PCR     Status: None   Collection Time: 04/08/18  1:06 AM  Result Value Ref Range Status   Adenovirus NOT DETECTED NOT DETECTED Final   Coronavirus 229E NOT DETECTED NOT DETECTED Final    Comment: (NOTE) The Coronavirus on the Respiratory Panel, DOES NOT test for the novel  Coronavirus (2019 nCoV)    Coronavirus HKU1 NOT DETECTED NOT DETECTED Final   Coronavirus NL63 NOT DETECTED NOT DETECTED Final   Coronavirus OC43 NOT DETECTED NOT DETECTED Final   Metapneumovirus NOT DETECTED NOT DETECTED Final   Rhinovirus / Enterovirus NOT  DETECTED NOT DETECTED Final   Influenza A NOT DETECTED NOT DETECTED Final   Influenza B NOT DETECTED NOT DETECTED Final   Parainfluenza Virus 1 NOT DETECTED NOT DETECTED Final   Parainfluenza Virus 2 NOT DETECTED NOT DETECTED Final   Parainfluenza Virus 3 NOT DETECTED NOT DETECTED Final   Parainfluenza Virus 4 NOT DETECTED NOT DETECTED Final   Respiratory Syncytial Virus NOT DETECTED NOT DETECTED Final   Bordetella pertussis NOT DETECTED NOT DETECTED Final   Chlamydophila pneumoniae NOT DETECTED NOT DETECTED Final   Mycoplasma pneumoniae NOT DETECTED NOT DETECTED Final    Comment: Performed at [facility], [place]. [address], [place], [place] [address]  C difficile quick scan w PCR reflex     Status: None   Collection Time: 04/08/18  2:30 PM  Result Value Ref Range Status   C Diff antigen NEGATIVE NEGATIVE Final   C Diff toxin NEGATIVE NEGATIVE Final   C Diff interpretation No C. difficile detected.  Final    Comment: Performed at [facility], [place] [address], [place], [place] [address]  Fungus culture, blood     Status: None (Preliminary result)   Collection Time: 04/09/18  5:15 PM  Result Value Ref Range Status   Specimen Description  Final    BLOOD RIGHT HAND Performed at [facility], [place] [address], [place], [place] [address]    Special Requests   Final    BOTTLES DRAWN AEROBIC AND ANAEROBIC Blood Culture adequate volume Performed at [place] [address] [place], [place] [address]    Culture   Final    NO GROWTH 3 DAYS Performed at [facility], [place] [address], [place], [place] [address]    Report Status PENDING  Incomplete         Radiology Studies: Dg Chest Port 1 View  Result Date: 04/12/2018 CLINICAL DATA:  Respiratory failure EXAM: PORTABLE CHEST 1 VIEW COMPARISON:  04/11/2018 FINDINGS: Multifocal opacities, right upper lobe and left lower lobe predominant, stable versus mildly improved.  No definite pleural effusions. No pneumothorax. The heart is normal in size. Right chest power port terminates in the lower SVC. IMPRESSION: Stable multifocal patchy opacity/pneumonia, right upper lobe predominant. Electronically Signed   By: Julian Hy M.D.   On: 04/12/2018 05:22        Scheduled Meds: . chlorhexidine  15 mL Mouth Rinse BID  . Chlorhexidine Gluconate Cloth  6 each Topical Daily  . diltiazem  10 mg Intravenous Once  . feeding supplement  1 Container Oral TID BM  . guaiFENesin-codeine  5 mL Oral QHS  . insulin aspart  0-9 Units Subcutaneous Q4H  . mouth rinse  15 mL Mouth Rinse q12n4p  . metoprolol succinate  25 mg Oral Daily  . montelukast  10 mg Oral QHS  . pantoprazole  40 mg Oral BID  . phosphorus  250 mg Oral BID   Continuous Infusions: . cefTRIAXone (ROCEPHIN)  IV Stopped (04/12/18 1423)     LOS: 5 days     Georgette Shell, MD [facility]  If 7PM-7AM, please contact night-coverage [email or web address] Password [email or web address] 04/13/2018, 9:39 AM

## 2018-04-13 NOTE — Progress Notes (Signed)
These preliminary result these preliminary results were noted.  Awaiting final report.

## 2018-04-13 NOTE — Evaluation (Signed)
Physical Therapy Evaluation Patient Details Name: Stacy Shelton MRN: [id number] DOB: September 10, 1960 Today's Date: 04/13/2018   History of Present Illness  57 y.o. 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 Dx of sepsis, PNA.   Clinical Impression  Pt admitted with above diagnosis. Pt currently with functional limitations due to the deficits listed below (see PT Problem List). Pt ambulated 110' with RW, no loss of balance, HR 113 max.  Pt will benefit from skilled PT to increase their independence and safety with mobility to allow discharge to the venue listed below.       Follow Up Recommendations No PT follow up    Equipment Recommendations  Rolling walker with 5" wheels    Recommendations for Other Services       Precautions / Restrictions Precautions Precautions: None Precaution Comments: denies falls Restrictions Weight Bearing Restrictions: No      Mobility  Bed Mobility Overal bed mobility: Modified Independent             General bed mobility comments: HOB up 35*   Transfers Overall transfer level: Needs assistance Equipment used: None Transfers: Sit to/from Stand Sit to Stand: Min guard            Ambulation/Gait Ambulation/Gait assistance: Min guard Gait Distance (Feet): 110 Feet Assistive device: Rolling walker (2 wheeled) Gait Pattern/deviations: Decreased stride length Gait velocity: WFL   General Gait Details: VCs to increase step length, no loss of balance, HR 113 max  Stairs            Wheelchair Mobility    Modified Rankin (Stroke Patients Only)       Balance Overall balance assessment: Modified Independent                                           Pertinent Vitals/Pain Pain Assessment: 0-10 Pain  Score: 0-No pain    Home Living Family/patient expects to be discharged to:: Private residence Living Arrangements: Parent Available Help at Discharge: Family         Home Layout: Two level Home Equipment: None      Prior Function Level of Independence: Independent               Hand Dominance        Extremity/Trunk Assessment   Upper Extremity Assessment Upper Extremity Assessment: Overall WFL for tasks assessed    Lower Extremity Assessment Lower Extremity Assessment: Overall WFL for tasks assessed    Cervical / Trunk Assessment Cervical / Trunk Assessment: Normal  Communication   Communication: No difficulties  Cognition Arousal/Alertness: Awake/alert Behavior During Therapy: WFL for tasks assessed/performed Overall Cognitive Status: Within Functional Limits for tasks assessed                                        General Comments      Exercises     Assessment/Plan    PT Assessment Patient needs continued PT services  PT Problem List Decreased activity tolerance;Decreased mobility       PT Treatment Interventions Gait training;DME instruction;Stair training;Therapeutic exercise;Patient/family education    PT Goals (  Current goals can be found in the Care Plan section)  Acute Rehab PT Goals Patient Stated Goal: to walk her 3 dogs PT Goal Formulation: With patient Time For Goal Achievement: 04/27/18 Potential to Achieve Goals: Good    Frequency Min 3X/week   Barriers to discharge        Co-evaluation               AM-PAC PT "6 Clicks" Mobility  Outcome Measure Help needed turning from your back to your side while in a flat bed without using bedrails?: A Little Help needed moving from lying on your back to sitting on the side of a flat bed without using bedrails?: A Little Help needed moving to and from a bed to a chair (including a wheelchair)?: A Little Help needed standing up from a chair using your arms (e.g.,  wheelchair or bedside chair)?: A Little Help needed to walk in hospital room?: A Little Help needed climbing 3-5 steps with a railing? : A Little 6 Click Score: 18    End of Session Equipment Utilized During Treatment: Gait belt Activity Tolerance: Patient tolerated treatment well Patient left: in bed;with nursing/sitter in room;with call bell/phone within reach Nurse Communication: Mobility status PT Visit Diagnosis: Difficulty in walking, not elsewhere classified (R26.2)    Time: 2924-4628 PT Time Calculation (min) (ACUTE ONLY): 22 min   Charges:   PT Evaluation $PT Eval Low Complexity: 1 Low         Philomena Doheny PT 04/13/2018  Acute Rehabilitation Services Pager [phone] Office [phone]

## 2018-04-13 NOTE — Progress Notes (Signed)
Pt. Offered CPAP as ordered and patient refused stating that she did well without device last evening.  Will be available if patient changes her mind.

## 2018-04-14 ENCOUNTER — Encounter (HOSPITAL_COMMUNITY): Payer: Self-pay | Admitting: *Deleted

## 2018-04-14 LAB — COMPREHENSIVE METABOLIC PANEL
ALT: 22 U/L (ref 0–44)
AST: 22 U/L (ref 15–41)
Albumin: 2.2 g/dL — ABNORMAL LOW (ref 3.5–5.0)
Alkaline Phosphatase: 246 U/L — ABNORMAL HIGH (ref 38–126)
Anion gap: 8 (ref 5–15)
BUN: 15 mg/dL (ref 6–20)
CO2: 29 mmol/L (ref 22–32)
Calcium: 7.8 mg/dL — ABNORMAL LOW (ref 8.9–10.3)
Chloride: 101 mmol/L (ref 98–111)
Creatinine, Ser: 0.51 mg/dL (ref 0.44–1.00)
GFR calc Af Amer: >60 mL/min (ref >60)
GFR calc non Af Amer: >60 mL/min (ref >60)
GLUCOSE: 170 mg/dL — AB (ref 70–99)
Potassium: 3.9 mmol/L (ref 3.5–5.1)
Sodium: 138 mmol/L (ref 135–145)
Total Bilirubin: 2.8 mg/dL — ABNORMAL HIGH (ref 0.3–1.2)
Total Protein: 5.6 g/dL — ABNORMAL LOW (ref 6.5–8.1)

## 2018-04-14 LAB — CBC
HCT: 24.8 % — ABNORMAL LOW (ref 36.0–46.0)
Hemoglobin: 7.8 g/dL — ABNORMAL LOW (ref 12.0–15.0)
MCH: 27.8 pg (ref 26.0–34.0)
MCHC: 31.5 g/dL (ref 30.0–36.0)
MCV: 88.3 fL (ref 80.0–100.0)
NRBC: 0.4 % — AB (ref 0.0–0.2)
Platelets: 30 10*3/uL — ABNORMAL LOW (ref 150–400)
RBC: 2.81 MIL/uL — AB (ref 3.87–5.11)
RDW: 21.2 % — ABNORMAL HIGH (ref 11.5–15.5)
WBC: 8 10*3/uL (ref 4.0–10.5)

## 2018-04-14 LAB — GLUCOSE, CAPILLARY
Glucose-Capillary: 142 mg/dL — ABNORMAL HIGH (ref 70–99)
Glucose-Capillary: 152 mg/dL — ABNORMAL HIGH (ref 70–99)
Glucose-Capillary: 163 mg/dL — ABNORMAL HIGH (ref 70–99)
Glucose-Capillary: 165 mg/dL — ABNORMAL HIGH (ref 70–99)
Glucose-Capillary: 187 mg/dL — ABNORMAL HIGH (ref 70–99)
Glucose-Capillary: 208 mg/dL — ABNORMAL HIGH (ref 70–99)

## 2018-04-14 LAB — PREPARE RBC (CROSSMATCH)

## 2018-04-14 MED ORDER — DIPHENHYDRAMINE HCL 50 MG/ML IJ SOLN
25.0000 mg | Freq: Once | INTRAMUSCULAR | Status: AC
  Administered 2018-04-14: 25 mg via INTRAVENOUS
  Filled 2018-04-14: qty 1

## 2018-04-14 MED ORDER — SODIUM CHLORIDE 0.9% IV SOLUTION: Freq: Once | INTRAVENOUS | Status: DC

## 2018-04-14 MED ORDER — ACETAMINOPHEN 500 MG PO TABS
1000.0000 mg | ORAL_TABLET | ORAL | Status: AC
  Administered 2018-04-14: 1000 mg via ORAL
  Filled 2018-04-14: qty 2

## 2018-04-14 MED ORDER — SACCHAROMYCES BOULARDII 250 MG PO CAPS
250.0000 mg | ORAL_CAPSULE | Freq: Two times a day (BID) | ORAL | Status: DC
  Administered 2018-04-14 – 2018-04-16 (×5): 250 mg via ORAL
  Filled 2018-04-14 (×5): qty 1

## 2018-04-14 NOTE — Progress Notes (Signed)
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   In the ED blood pressuresdid not respond to aggressive IV fluid hydration and was started on pressors. Blood cultures weredrawn andshewas started on broad-spectrum IV antibiotics. Transferedto stepdown unit for pressor support  Assessment & Plan:   Active Problems:   Absolute anemia   Metastatic breast cancer (HCC)   Diarrhea   Sepsis (HCC)   Hypokalemia   Hypomagnesemia   Acute lower UTI   Hypophosphatemia   Septic shock (HCC)   Malnutrition of moderate degree   Acute respiratory failure with hypoxia (HCC)   Acute cystitis without hematuria   Pneumonia of both lungs due to infectious organism   #1 sepsis/septic shock resolved patient off of pressors 04/08/2018. CT scan of the chestshow severe right lung multilobar pneumonia, multifocal early pneumonia in the left lung with associated small layering right pleural effusion.Patient received meropenem now changed to Rocephin for a total of 8 days. Rocephin was started 04/11/2018.  Last day 04/18/2018  #2 respiratory distresssecondary to bilateral pneumonia. Patientslept without CPAP without oxygen overnight did well.Chest x-ray 3/14 shows stable multifocal patchy opacity and right upper lobe predominantly. Patient has made significant improvement in the last few days.  Cough improved.  #3 pancytopenia probably secondary to recent chemo versus sepsis. Hemoglobin this morning is8.2status post 1 unit of transfusion.Will transfuse to keep hemoglobin  above 8.  Labs pending for this morning.  #4 diastolic CHF stable  #5 type 2 diabetes uncontrolled hemoglobin A1c 12.9. Continue Levemir insulin 15 units daily. Her blood glucose has been stable here.  #6 metastatic breast cancer with mets to liver brain and bone.s/post recent chemo.  #7 sinus tachycardiaresolved continue metoprolol.  #8 diarrhea patient continues to have diarrhea on and off.  Continue probiotics.  DVT prophylaxis:SCD Code Status:Full code  family Communication:Family at bedside Disposition Plan:Pending clinical improvement  Consultants:  PCCM infectious disease cardiology oncology  Procedures:None Antimicrobials:rocephin    Nutrition Problem: Moderate Malnutrition Etiology: chronic illness, cancer and cancer related treatments     Signs/Symptoms: mild fat depletion, mild muscle depletion, percent weight loss Percent weight loss: 11 %    Interventions: Boost Breeze, MVI  Estimated body mass index is 27.07 kg/m as calculated from the following:   Height as of this encounter: 5\' 2"  (1.575 m).   Weight as of this encounter: 67.1 kg.   Subjective: Sitting up in commode appears comfortable awake alert not on oxygen cough better  Objective: Vitals:   04/14/18 0341 04/14/18 0600 04/14/18 0800 04/14/18 1000  BP:  (!) 104/51 110/70 (!) 134/51  Pulse:  92 90 87  Resp:  (!) 31 (!) 30 (!) 26  Temp: 98.2 F (36.8 C)  98.3 F (36.8 C)   TempSrc: Oral  Oral   SpO2:  93% 96% 97%  Weight:      Height:        Intake/Output Summary (Last 24 hours) at 04/14/2018 1200 Last data filed at 04/14/2018 1148 Gross per 24 hour  Intake 800 ml  Output 200 ml  Net 600 ml   Filed Weights   04/07/18 1614  Weight: 67.1 kg  Examination:  General exam: Appears calm and comfortable  Respiratory system: Clear to auscultation. Respiratory effort normal. Cardiovascular system: S1 & S2 heard, RRR. No JVD, murmurs, rubs, gallops or clicks. No  pedal edema. Gastrointestinal system: Abdomen is nondistended, soft and nontender. No organomegaly or masses felt. Normal bowel sounds heard. Central nervous system: Alert and oriented. No focal neurological deficits. Extremities: Symmetric 5 x 5 power. Skin: No rashes, lesions or ulcers Psychiatry: Judgement and insight appear normal. Mood & affect appropriate.     Data Reviewed: I have personally reviewed following labs and imaging studies  CBC: Recent Labs  Lab 04/07/18 2042  04/08/18 1626 04/09/18 0422 04/10/18 0552 04/11/18 0415 04/12/18 0500 04/13/18 0500  WBC 7.5   < > 3.7* 3.8* 6.6 10.2 8.9 8.2  NEUTROABS 3.6  --  1.5* 2.4  --   --  4.2  --   HGB 5.7*   < > 8.1* 8.3* 7.1* 8.6* 8.3* 8.1*  HCT 18.4*   < > 26.3* 26.4* 22.6* 27.5* 27.0* 26.0*  MCV 86.0   < > 87.1 84.9 85.3 87.3 88.8 87.8  PLT 109*   < > 74* 66* 58* 46* 33* 32*   < > = values in this interval not displayed.   Basic Metabolic Panel: Recent Labs  Lab 04/07/18 2042 04/08/18 0216  04/09/18 0422 04/09/18 1102 04/10/18 0552 04/11/18 0415 04/12/18 0500 04/13/18 0500  NA 135  133* 136   < > 141  --  143 141 138 140  K 3.0*  2.9* 3.5   < > 3.7  --  2.9* 2.9* 3.3* 3.6  CL 106  106 110   < > 110  --  110 103 99 102  CO2 17*  18* 19*   < > 22  --  27 28 31 29   GLUCOSE 262*  258* 212*   < > 132*  --  109* 156* 235* 175*  BUN 12  11 13    < > 11  --  16 18 21* 18  CREATININE 0.83  0.82 0.85   < > 0.65  --  0.67 0.51 0.45 0.49  CALCIUM 7.5*  7.3* 7.1*   < > 7.7*  --  7.8* 7.7* 7.9* 7.8*  MG 1.9 1.8  --   --   --  2.1 1.8 2.1  --   PHOS 2.0* 2.2*  --   --  2.7  --  1.7* 1.7* 2.6   < > = values in this interval not displayed.   GFR: Estimated Creatinine Clearance: 68.9 mL/min (by C-G formula based on SCr of 0.49 mg/dL). Liver Function Tests: Recent Labs  Lab 04/08/18 1626 04/10/18 0552 04/11/18 0415 04/12/18 0500 04/13/18 0500  AST 22 20 54* 35 30  ALT 16 17 35 30 29  ALKPHOS 54 50 91 201*  291*  BILITOT 4.2* 5.5* 7.9* 6.9* 4.2*  PROT 6.0* 5.9* 5.5* 5.2* 5.2*  ALBUMIN 2.5* 2.4* 2.1* 2.2* 2.1*   No results for input(s): LIPASE, AMYLASE in the last 168 hours. No results for input(s): AMMONIA in the last 168 hours. Coagulation Profile: Recent Labs  Lab 04/07/18 2042 04/10/18 0552  INR 1.6* 1.6*   Cardiac Enzymes: Recent Labs  Lab 04/10/18 0552  CKTOTAL 11*  CKMB 0.9   BNP (last 3 results) No results for input(s): PROBNP in the last 8760 hours. HbA1C: No results for input(s): HGBA1C in the last 72 hours. CBG: Recent Labs  Lab 04/13/18 1956 04/13/18 2313 04/14/18 0339 04/14/18  0741 04/14/18 1126  GLUCAP 141* 177* 165* 142* 163*   Lipid Profile: No results for input(s): CHOL, HDL, LDLCALC, TRIG, CHOLHDL, LDLDIRECT in the last 72 hours. Thyroid Function Tests: No results for input(s): TSH, T4TOTAL, FREET4, T3FREE, THYROIDAB in the last 72 hours. Anemia Panel: No results for input(s): VITAMINB12, FOLATE, FERRITIN, TIBC, IRON, RETICCTPCT in the last 72 hours. Sepsis Labs: Recent Labs  Lab 04/07/18 1626 04/07/18 2042 04/09/18 1715 04/10/18 0552 04/11/18 0415  PROCALCITON  --  14.25 69.16 46.84 23.39  LATICACIDVEN 1.9 1.0  --   --   --     Recent Results (from the past 240 hour(s))  Urine Culture     Status: Abnormal   Collection Time: 04/07/18 11:15 AM  Result Value Ref Range Status   Specimen Description   Final    URINE, CLEAN CATCH Performed at [facility], [place] [address] [place], [place] [address]    Special Requests   Final    NONE Performed at [facility], [place] [address], [place], [place] [address]    Culture >=100,000 COLONIES/mL KLEBSIELLA PNEUMONIAE (A)  Final   Report Status 04/10/2018 FINAL  Final   Organism ID, Bacteria KLEBSIELLA PNEUMONIAE (A)  Final      Susceptibility   Klebsiella pneumoniae - MIC*    AMPICILLIN >=32 RESISTANT Resistant     CEFAZOLIN <=4 SENSITIVE Sensitive      CEFTRIAXONE <=1 SENSITIVE Sensitive     CIPROFLOXACIN <=0.25 SENSITIVE Sensitive     GENTAMICIN <=1 SENSITIVE Sensitive     IMIPENEM <=0.25 SENSITIVE Sensitive     NITROFURANTOIN <=16 SENSITIVE Sensitive     TRIMETH/SULFA <=20 SENSITIVE Sensitive     AMPICILLIN/SULBACTAM 8 SENSITIVE Sensitive     PIP/TAZO <=4 SENSITIVE Sensitive     Extended ESBL NEGATIVE Sensitive     * >=100,000 COLONIES/mL KLEBSIELLA PNEUMONIAE  Culture, Blood     Status: None   Collection Time: 04/07/18 11:27 AM  Result Value Ref Range Status   Specimen Description   Final    BLOOD RIGHT ARM Performed at [facility], [place] [address] [place], [place] [address]    Special Requests   Final    BOTTLES DRAWN AEROBIC AND ANAEROBIC Blood Culture results may not be optimal due to an excessive volume of blood received in culture bottles   Culture   Final    NO GROWTH 5 DAYS Performed at [facility], [place] [address] [place], [place] [address]    Report Status 04/12/2018 FINAL  Final  Culture, Blood     Status: None   Collection Time: 04/07/18 12:15 PM  Result Value Ref Range Status   Specimen Description   Final    PORTA CATH Performed at [facility], [place] [address], [place],  [address]    Special Requests   Final    BOTTLES DRAWN AEROBIC AND ANAEROBIC Blood Culture adequate volume   Culture   Final    NO GROWTH 5 DAYS Performed at [facility], [place] [address], [place],  [address]    Report Status 04/12/2018 FINAL  Final  MRSA PCR Screening     Status: None   Collection Time: 04/07/18  8:41 PM  Result Value Ref Range Status   MRSA by PCR NEGATIVE NEGATIVE Final    Comment:        The GeneXpert MRSA Assay (FDA approved for NASAL specimens only), is one  component of a comprehensive MRSA colonization surveillance program. It is not intended to diagnose MRSA infection nor to guide or monitor treatment for MRSA infections.  Performed at [facility], [place] [address] [place], [place] [address]   Gastrointestinal Panel by PCR , Stool     Status: None   Collection Time: 04/08/18  1:06 AM  Result Value Ref Range Status   Campylobacter species NOT DETECTED NOT DETECTED Final   Plesimonas shigelloides NOT DETECTED NOT DETECTED Final   Salmonella species NOT DETECTED NOT DETECTED Final   Yersinia enterocolitica NOT DETECTED NOT DETECTED Final   Vibrio species NOT DETECTED NOT DETECTED Final   Vibrio cholerae NOT DETECTED NOT DETECTED Final   Enteroaggregative E coli (EAEC) NOT DETECTED NOT DETECTED Final   Enteropathogenic E coli (EPEC) NOT DETECTED NOT DETECTED Final   Enterotoxigenic E coli (ETEC) NOT DETECTED NOT DETECTED Final   Shiga like toxin producing E coli (STEC) NOT DETECTED NOT DETECTED Final   Shigella/Enteroinvasive E coli (EIEC) NOT DETECTED NOT DETECTED Final   Cryptosporidium NOT DETECTED NOT DETECTED Final   Cyclospora cayetanensis NOT DETECTED NOT DETECTED Final   Entamoeba histolytica NOT DETECTED NOT DETECTED Final   Giardia lamblia NOT DETECTED NOT DETECTED Final   Adenovirus F40/41 NOT DETECTED NOT DETECTED Final   Astrovirus NOT DETECTED NOT DETECTED Final   Norovirus GI/GII NOT DETECTED NOT DETECTED Final   Rotavirus A NOT DETECTED NOT DETECTED Final   Sapovirus (I, II, IV, and V) NOT DETECTED NOT DETECTED Final    Comment: Performed at [facility], [place] [place], [place] [address]  Respiratory Panel by PCR     Status: None   Collection Time: 04/08/18  1:06 AM  Result Value Ref Range Status   Adenovirus NOT DETECTED NOT DETECTED Final   Coronavirus 229E NOT DETECTED NOT DETECTED Final    Comment: (NOTE) The Coronavirus on the Respiratory Panel, DOES NOT test for the novel  Coronavirus (2019 nCoV)    Coronavirus HKU1 NOT DETECTED NOT DETECTED Final   Coronavirus NL63 NOT DETECTED NOT DETECTED Final   Coronavirus OC43 NOT DETECTED NOT DETECTED  Final   Metapneumovirus NOT DETECTED NOT DETECTED Final   Rhinovirus / Enterovirus NOT DETECTED NOT DETECTED Final   Influenza A NOT DETECTED NOT DETECTED Final   Influenza B NOT DETECTED NOT DETECTED Final   Parainfluenza Virus 1 NOT DETECTED NOT DETECTED Final   Parainfluenza Virus 2 NOT DETECTED NOT DETECTED Final   Parainfluenza Virus 3 NOT DETECTED NOT DETECTED Final   Parainfluenza Virus 4 NOT DETECTED NOT DETECTED Final   Respiratory Syncytial Virus NOT DETECTED NOT DETECTED Final   Bordetella pertussis NOT DETECTED NOT DETECTED Final   Chlamydophila pneumoniae NOT DETECTED NOT DETECTED Final   Mycoplasma pneumoniae NOT DETECTED NOT DETECTED Final    Comment: Performed at [facility], [place]. [address] [place], [place] [address]  C difficile quick scan w PCR reflex     Status: None   Collection Time: 04/08/18  2:30 PM  Result Value Ref Range Status   C Diff antigen NEGATIVE NEGATIVE Final   C Diff toxin NEGATIVE NEGATIVE Final   C Diff interpretation No C. difficile detected.  Final    Comment: Performed at [facility], [place] [address], [place],  [address]  Fungus culture, blood     Status: None (Preliminary result)   Collection Time: 04/09/18  5:15 PM  Result Value Ref Range Status  Specimen Description   Final    BLOOD RIGHT HAND Performed at [place] [address], [place], [place] [address]    Special Requests   Final    BOTTLES DRAWN AEROBIC AND ANAEROBIC Blood Culture adequate volume Performed at [place] [address] [place], [place] [address]    Culture   Final    NO GROWTH 5 DAYS Performed at [facility], [place] [address] [place], [place] [address]    Report Status PENDING  Incomplete         Radiology Studies: No results found.      Scheduled Meds: . chlorhexidine  15 mL Mouth Rinse BID  . Chlorhexidine Gluconate Cloth  6 each Topical Daily  . diltiazem  10  mg Intravenous Once  . feeding supplement  1 Container Oral TID BM  . guaiFENesin-codeine  5 mL Oral QHS  . insulin aspart  0-9 Units Subcutaneous Q4H  . mouth rinse  15 mL Mouth Rinse q12n4p  . metoprolol succinate  25 mg Oral Daily  . montelukast  10 mg Oral QHS  . pantoprazole  40 mg Oral BID  . phosphorus  250 mg Oral BID   Continuous Infusions: . cefTRIAXone (ROCEPHIN)  IV Stopped (04/13/18 1408)     LOS: 6 days     Georgette Shell, MD [facility]  If 7PM-7AM, please contact night-coverage [email or web address] Password [email or web address] 04/14/2018, 12:00 PM

## 2018-04-14 NOTE — Progress Notes (Signed)
Not on active PCCM list  Check in on patient Has occasional cough Has no other significant concerns Tolerated being off CPAP last night  We will see as needed

## 2018-04-14 NOTE — Progress Notes (Signed)
Stacy Shelton   DOB:02/08/1960   VW#:[id number]    Assessment & Plan:   Metastatic breast 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 Continue aggressive supportive care CT imaging shows stable disease. She is due for cycle 2 of chemotherapy this week. It is unlikely she will recover to the point of resuming chemotherapy yet. Due to stability of her disease, I reassured the patient and family that we need to focus on supportive care first before discussion about resumption of treatment.  Recurrent fever, respiratory distress withCT imaging confirmed bilateral pneumonia Cultures are positive for Klebsiella She was seen by infectious disease team.  Her antibiotics are narrowed to ceftriaxone, total 8 days treatment, last day 3/20  Progressive pancytopenia Likely due to bone marrow suppression from recent chemotherapy, on the background history of bone metastasis.She also have signs of mild hemolysis, due to malignancy and chemotherapy I recommend transfusion support to keep hemoglobin greater than 8; plan to recheck tomorrow and consider transfusion She does not need transfusion today.  She does not need platelet transfusion unless platelet count less than 10,000 or she has signs of bleeding  Poorly controlled diabetes She will continue insulin treatment  Multiple electrolyte imbalance Replace as needed  Progressive jaundice, resolving The elevated bilirubin could be due to cholestasis or sepsis CT imaging did not show biliary obstruction Hemolysis from recent transfuse blood is also a possibility Observe only Adjust medication as needed  CODE STATUS Full code  Discharge planning Anticipate she will be in the hospital for the next few days Overall, she is improving.  I suspect she will be moved out of ICU soon.   [name], MD 04/14/2018  8:38 AM   Subjective:  She is doing well.  She is off oxygen this morning.  Her  cough is improving.  She has been afebrile.  Jaundice is improving. The patient denies any recent signs or symptoms of bleeding such as spontaneous epistaxis, hematuria or hematochezia.   Objective:  Vitals:   04/14/18 0600 04/14/18 0800  BP: (!) 104/51 110/70  Pulse: 92 90  Resp: (!) 31 (!) 30  Temp:  98.3 F (36.8 C)  SpO2: 93% 96%     Intake/Output Summary (Last 24 hours) at 04/14/2018 0838 Last data filed at 04/14/2018 0600 Gross per 24 hour  Intake 560 ml  Output -  Net 560 ml    GENERAL:alert, no distress and comfortable.  She looks less jaundiced SKIN: She looks pale EYES: normal, Conjunctiva are pale and non-injected, sclera clear OROPHARYNX:no exudate, no erythema and lips, buccal mucosa, and tongue normal  NECK: supple, thyroid normal size, non-tender, without nodularity LYMPH:  no palpable lymphadenopathy in the cervical, axillary or inguinal LUNGS: Reduced breath sounds on both lung bases with mild increased breathing effort  hEART: regular rate & rhythm and no murmurs and no lower extremity edema ABDOMEN:abdomen soft, non-tender and normal bowel sounds Musculoskeletal:no cyanosis of digits and no clubbing  NEURO: alert & oriented x 3 with fluent speech, no focal motor/sensory deficits   Labs:  Lab Results  Component Value Date   WBC 8.2 04/13/2018   HGB 8.1 (L) 04/13/2018   HCT 26.0 (L) 04/13/2018   MCV 87.8 04/13/2018   PLT 32 (L) 04/13/2018   NEUTROABS 4.2 04/12/2018    Lab Results  Component Value Date   NA 140 04/13/2018   K 3.6 04/13/2018   CL 102 04/13/2018   CO2 29 04/13/2018  Studies:  No results found.

## 2018-04-15 LAB — CBC
HCT: 28 % — ABNORMAL LOW (ref 36.0–46.0)
Hemoglobin: 8.5 g/dL — ABNORMAL LOW (ref 12.0–15.0)
MCH: 27.1 pg (ref 26.0–34.0)
MCHC: 30.4 g/dL (ref 30.0–36.0)
MCV: 89.2 fL (ref 80.0–100.0)
Platelets: 30 10*3/uL — ABNORMAL LOW (ref 150–400)
RBC: 3.14 MIL/uL — ABNORMAL LOW (ref 3.87–5.11)
RDW: 19.9 % — AB (ref 11.5–15.5)
WBC: 8.5 10*3/uL (ref 4.0–10.5)
nRBC: 0.5 % — ABNORMAL HIGH (ref 0.0–0.2)

## 2018-04-15 LAB — BPAM RBC
Blood Product Expiration Date: 202004092359
ISSUE DATE / TIME: 202003162332
Unit Type and Rh: 6200

## 2018-04-15 LAB — GLUCOSE, CAPILLARY
Glucose-Capillary: 124 mg/dL — ABNORMAL HIGH (ref 70–99)
Glucose-Capillary: 194 mg/dL — ABNORMAL HIGH (ref 70–99)
Glucose-Capillary: 213 mg/dL — ABNORMAL HIGH (ref 70–99)
Glucose-Capillary: 214 mg/dL — ABNORMAL HIGH (ref 70–99)
Glucose-Capillary: 217 mg/dL — ABNORMAL HIGH (ref 70–99)
Glucose-Capillary: 79 mg/dL (ref 70–99)

## 2018-04-15 LAB — BASIC METABOLIC PANEL
ANION GAP: 7 (ref 5–15)
BUN: 12 mg/dL (ref 6–20)
CALCIUM: 8 mg/dL — AB (ref 8.9–10.3)
CO2: 29 mmol/L (ref 22–32)
Chloride: 102 mmol/L (ref 98–111)
Creatinine, Ser: 0.45 mg/dL (ref 0.44–1.00)
GFR calc Af Amer: >60 mL/min (ref >60)
GFR calc non Af Amer: >60 mL/min (ref >60)
GLUCOSE: 121 mg/dL — AB (ref 70–99)
Potassium: 4 mmol/L (ref 3.5–5.1)
Sodium: 138 mmol/L (ref 135–145)

## 2018-04-15 LAB — TYPE AND SCREEN
ABO/RH(D): A POS
Antibody Screen: NEGATIVE
Unit division: 0

## 2018-04-15 MED ORDER — ENSURE ENLIVE PO LIQD
237.0000 mL | ORAL | Status: DC
  Administered 2018-04-15: 237 mL via ORAL

## 2018-04-15 MED ORDER — ADULT MULTIVITAMIN W/MINERALS CH
1.0000 | ORAL_TABLET | Freq: Every day | ORAL | Status: DC
  Administered 2018-04-15 – 2018-04-16 (×2): 1 via ORAL
  Filled 2018-04-15 (×2): qty 1

## 2018-04-15 NOTE — Plan of Care (Signed)

## 2018-04-15 NOTE — Progress Notes (Signed)
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   In the ED blood pressuresdid not respond to aggressive IV fluid hydration and was started on pressors. Blood cultures weredrawn andshewas started on broad-spectrum IV antibiotics. Transferedto stepdown unit for pressor support   Assessment & Plan:   Active Problems:   Absolute anemia   Metastatic breast cancer (HCC)   Diarrhea   Sepsis (HCC)   Hypokalemia   Hypomagnesemia   Acute lower UTI   Hypophosphatemia   Septic shock (HCC)   Malnutrition of moderate degree   Acute respiratory failure with hypoxia (HCC)   Acute cystitis without hematuria   Pneumonia of both lungs due to infectious organism  #1 sepsis/septic shock resolved patient off of pressors 04/08/2018. CT scan of the chestshow severe right lung multilobar pneumonia, multifocal early pneumonia in the left lung with associated small layering right pleural effusion.Patient received meropenem now changed to Rocephin for a total of 8 days. Rocephin was started 04/11/2018.  Last day 04/18/2018.  She should be able to be discharged after the last dose of antibiotic.  #2 respiratory distresssecondary to bilateral pneumonia. Patientslept without CPAP without oxygen overnight did well.Chest x-ray3/14shows stable multifocal patchy opacity and right upper lobe predominantly.  #3 pancytopenia probably secondary to recent chemo versus sepsis.  She has received multiple blood transfusions.  IMA globin today is 8.5.  Platelet count is 30,000.   #4  diastolic CHF stable not on any diuretics.  #5 type 2 diabetes uncontrolled hemoglobin A1c 12.9. Continue Levemir insulin 15 units daily. Her blood glucose has been stable here.  #6 metastatic breast cancer with mets to liver brain and bone.s/post recent chemo.  #7 sinus tachycardiaresolved continue metoprolol.  #8 diarrhea on and off but reports is better.  Daily probiotics.  DVT prophylaxis:SCD Code Status:Full code  family Communication:Family at bedside Disposition Plan: Pending last dose of IV antibiotics which is on 04/18/2018  Consultants:  PCCM infectious disease cardiology oncology  Procedures:None Antimicrobials:rocephin  Nutrition Problem: Moderate Malnutrition Etiology: chronic illness, cancer and cancer related treatments     Signs/Symptoms: mild fat depletion, mild muscle depletion, percent weight loss Percent weight loss: 11 %    Interventions: Boost Breeze, MVI  Estimated body mass index is 30.08 kg/m as calculated from the following:   Height as of this encounter: 5\' 2"  (1.575 m).   Weight as of this encounter: 74.6 kg.   Subjective: Patient resting in bed no tone any oxygen awake alert in no distress was able to speak to me without coughing today  Objective: Vitals:   04/14/18 2230 04/14/18 2327 04/14/18 2353 04/15/18 0320  BP: (!) 117/55 130/60 (!) 113/55 (!) 119/58  Pulse: 99 97 91 81  Resp: 20 20 20 18   Temp: 99.1 F (37.3 C) 98.9 F (37.2 C) 99.6 F (37.6 C) 98.4 F (36.9 C)  TempSrc: Axillary Oral Oral Oral  SpO2: 91% 91% 90% 90%  Weight:      Height:        Intake/Output Summary (Last 24 hours) at 04/15/2018 1004 Last data filed at 04/15/2018 0300 Gross per 24 hour  Intake 744 ml  Output 400  ml  Net 344 ml   Filed Weights   04/07/18 1614 04/14/18 1630  Weight: 67.1 kg 74.6 kg    Examination:  General exam: Appears calm and comfortable  Respiratory system very few scattered rhonchi otherwise clear to  auscultation. Respiratory effort normal. Cardiovascular system: S1 & S2 heard, RRR. No JVD, murmurs, rubs, gallops or clicks. No pedal edema. Gastrointestinal system: Abdomen is nondistended, soft and nontender. No organomegaly or masses felt. Normal bowel sounds heard. Central nervous system: Alert and oriented. No focal neurological deficits. Extremities: Symmetric 5 x 5 power. Skin: No rashes, lesions or ulcers Psychiatry: Judgement and insight appear normal. Mood & affect appropriate.     Data Reviewed: I have personally reviewed following labs and imaging studies  CBC: Recent Labs  Lab 04/08/18 1626 04/09/18 0422  04/11/18 0415 04/12/18 0500 04/13/18 0500 04/14/18 1221 04/15/18 0445  WBC 3.7* 3.8*   < > 10.2 8.9 8.2 8.0 8.5  NEUTROABS 1.5* 2.4  --   --  4.2  --   --   --   HGB 8.1* 8.3*   < > 8.6* 8.3* 8.1* 7.8* 8.5*  HCT 26.3* 26.4*   < > 27.5* 27.0* 26.0* 24.8* 28.0*  MCV 87.1 84.9   < > 87.3 88.8 87.8 88.3 89.2  PLT 74* 66*   < > 46* 33* 32* 30* 30*   < > = values in this interval not displayed.   Basic Metabolic Panel: Recent Labs  Lab 04/09/18 1102 04/10/18 0552 04/11/18 0415 04/12/18 0500 04/13/18 0500 04/14/18 1221 04/15/18 0445  NA  --  143 141 138 140 138 138  K  --  2.9* 2.9* 3.3* 3.6 3.9 4.0  CL  --  110 103 99 102 101 102  CO2  --  27 28 31 29 29 29   GLUCOSE  --  109* 156* 235* 175* 170* 121*  BUN  --  16 18 21* 18 15 12   CREATININE  --  0.67 0.51 0.45 0.49 0.51 0.45  CALCIUM  --  7.8* 7.7* 7.9* 7.8* 7.8* 8.0*  MG  --  2.1 1.8 2.1  --   --   --   PHOS 2.7  --  1.7* 1.7* 2.6  --   --    GFR: Estimated Creatinine Clearance: 72.5 mL/min (by C-G formula based on SCr of 0.45 mg/dL). Liver Function Tests: Recent Labs  Lab 04/10/18 0552 04/11/18 0415 04/12/18 0500 04/13/18 0500 04/14/18 1221  AST 20 54* 35 30 22  ALT 17 35 30 29 22   ALKPHOS 50 91 201* 291* 246*  BILITOT 5.5* 7.9* 6.9* 4.2* 2.8*  PROT 5.9* 5.5* 5.2* 5.2* 5.6*  ALBUMIN 2.4*  2.1* 2.2* 2.1* 2.2*   No results for input(s): LIPASE, AMYLASE in the last 168 hours. No results for input(s): AMMONIA in the last 168 hours. Coagulation Profile: Recent Labs  Lab 04/10/18 0552  INR 1.6*   Cardiac Enzymes: Recent Labs  Lab 04/10/18 0552  CKTOTAL 11*  CKMB 0.9   BNP (last 3 results) No results for input(s): PROBNP in the last 8760 hours. HbA1C: No results for input(s): HGBA1C in the last 72 hours. CBG: Recent Labs  Lab 04/14/18 1814 04/14/18 2005 04/14/18 2339 04/15/18 0444 04/15/18 0749  GLUCAP 152* 187* 208* 79 124*   Lipid Profile: No results for input(s): CHOL, HDL, LDLCALC, TRIG, CHOLHDL, LDLDIRECT in the last 72 hours. Thyroid Function Tests: No results for input(s): TSH, T4TOTAL, FREET4, T3FREE, THYROIDAB in the last 72 hours.  Anemia Panel: No results for input(s): VITAMINB12, FOLATE, FERRITIN, TIBC, IRON, RETICCTPCT in the last 72 hours. Sepsis Labs: Recent Labs  Lab 04/09/18 1715 04/10/18 0552 04/11/18 0415  PROCALCITON 69.16 46.84 23.39    Recent Results (from the past 240 hour(s))  Urine Culture     Status: Abnormal   Collection Time: 04/07/18 11:15 AM  Result Value Ref Range Status   Specimen Description   Final    URINE, CLEAN CATCH Performed at [facility], [place] [address], [place], [place] [address]    Special Requests   Final    NONE Performed at [facility], [place] [address] [place], [place] [address]    Culture >=100,000 COLONIES/mL KLEBSIELLA PNEUMONIAE (A)  Final   Report Status 04/10/2018 FINAL  Final   Organism ID, Bacteria KLEBSIELLA PNEUMONIAE (A)  Final      Susceptibility   Klebsiella pneumoniae - MIC*    AMPICILLIN >=32 RESISTANT Resistant     CEFAZOLIN <=4 SENSITIVE Sensitive     CEFTRIAXONE <=1 SENSITIVE Sensitive     CIPROFLOXACIN <=0.25 SENSITIVE Sensitive     GENTAMICIN <=1 SENSITIVE Sensitive     IMIPENEM <=0.25 SENSITIVE Sensitive     NITROFURANTOIN  <=16 SENSITIVE Sensitive     TRIMETH/SULFA <=20 SENSITIVE Sensitive     AMPICILLIN/SULBACTAM 8 SENSITIVE Sensitive     PIP/TAZO <=4 SENSITIVE Sensitive     Extended ESBL NEGATIVE Sensitive     * >=100,000 COLONIES/mL KLEBSIELLA PNEUMONIAE  Culture, Blood     Status: None   Collection Time: 04/07/18 11:27 AM  Result Value Ref Range Status   Specimen Description   Final    BLOOD RIGHT ARM Performed at [facility], [place] [address] [place], [place] [address]    Special Requests   Final    BOTTLES DRAWN AEROBIC AND ANAEROBIC Blood Culture results may not be optimal due to an excessive volume of blood received in culture bottles   Culture   Final    NO GROWTH 5 DAYS Performed at [facility], [place] [address] [place], [place] [address]    Report Status 04/12/2018 FINAL  Final  Culture, Blood     Status: None   Collection Time: 04/07/18 12:15 PM  Result Value Ref Range Status   Specimen Description   Final    PORTA CATH Performed at [facility], [place] [address] [place], [place] [address]    Special Requests   Final    BOTTLES DRAWN AEROBIC AND ANAEROBIC Blood Culture adequate volume   Culture   Final    NO GROWTH 5 DAYS Performed at [facility], [place] [address], [place], [place] [address]    Report Status 04/12/2018 FINAL  Final  MRSA PCR Screening     Status: None   Collection Time: 04/07/18  8:41 PM  Result Value Ref Range Status   MRSA by PCR NEGATIVE NEGATIVE Final    Comment:        The GeneXpert MRSA Assay (FDA approved for NASAL specimens only), is one component of a comprehensive MRSA colonization surveillance program. It is not intended to diagnose MRSA infection nor to guide or monitor treatment for MRSA infections. Performed at [facility], [place] [address], [place],  [address]   Gastrointestinal Panel by PCR , Stool     Status: None   Collection Time: 04/08/18  1:06 AM   Result Value Ref Range Status  Campylobacter species NOT DETECTED NOT DETECTED Final   Plesimonas shigelloides NOT DETECTED NOT DETECTED Final   Salmonella species NOT DETECTED NOT DETECTED Final   Yersinia enterocolitica NOT DETECTED NOT DETECTED Final   Vibrio species NOT DETECTED NOT DETECTED Final   Vibrio cholerae NOT DETECTED NOT DETECTED Final   Enteroaggregative E coli (EAEC) NOT DETECTED NOT DETECTED Final   Enteropathogenic E coli (EPEC) NOT DETECTED NOT DETECTED Final   Enterotoxigenic E coli (ETEC) NOT DETECTED NOT DETECTED Final   Shiga like toxin producing E coli (STEC) NOT DETECTED NOT DETECTED Final   Shigella/Enteroinvasive E coli (EIEC) NOT DETECTED NOT DETECTED Final   Cryptosporidium NOT DETECTED NOT DETECTED Final   Cyclospora cayetanensis NOT DETECTED NOT DETECTED Final   Entamoeba histolytica NOT DETECTED NOT DETECTED Final   Giardia lamblia NOT DETECTED NOT DETECTED Final   Adenovirus F40/41 NOT DETECTED NOT DETECTED Final   Astrovirus NOT DETECTED NOT DETECTED Final   Norovirus GI/GII NOT DETECTED NOT DETECTED Final   Rotavirus A NOT DETECTED NOT DETECTED Final   Sapovirus (I, II, IV, and V) NOT DETECTED NOT DETECTED Final    Comment: Performed at [facility], [place] [place], [place] [address]  Respiratory Panel by PCR     Status: None   Collection Time: 04/08/18  1:06 AM  Result Value Ref Range Status   Adenovirus NOT DETECTED NOT DETECTED Final   Coronavirus 229E NOT DETECTED NOT DETECTED Final    Comment: (NOTE) The Coronavirus on the Respiratory Panel, DOES NOT test for the novel  Coronavirus (2019 nCoV)    Coronavirus HKU1 NOT DETECTED NOT DETECTED Final   Coronavirus NL63 NOT DETECTED NOT DETECTED Final   Coronavirus OC43 NOT DETECTED NOT DETECTED Final   Metapneumovirus NOT DETECTED NOT DETECTED Final   Rhinovirus / Enterovirus NOT DETECTED NOT DETECTED Final   Influenza A NOT DETECTED NOT DETECTED Final   Influenza B NOT  DETECTED NOT DETECTED Final   Parainfluenza Virus 1 NOT DETECTED NOT DETECTED Final   Parainfluenza Virus 2 NOT DETECTED NOT DETECTED Final   Parainfluenza Virus 3 NOT DETECTED NOT DETECTED Final   Parainfluenza Virus 4 NOT DETECTED NOT DETECTED Final   Respiratory Syncytial Virus NOT DETECTED NOT DETECTED Final   Bordetella pertussis NOT DETECTED NOT DETECTED Final   Chlamydophila pneumoniae NOT DETECTED NOT DETECTED Final   Mycoplasma pneumoniae NOT DETECTED NOT DETECTED Final    Comment: Performed at [facility], [place]. [address], [place], [place] [address]  C difficile quick scan w PCR reflex     Status: None   Collection Time: 04/08/18  2:30 PM  Result Value Ref Range Status   C Diff antigen NEGATIVE NEGATIVE Final   C Diff toxin NEGATIVE NEGATIVE Final   C Diff interpretation No C. difficile detected.  Final    Comment: Performed at [facility], [place] [address] [place], [place] [address]  Fungus culture, blood     Status: None (Preliminary result)   Collection Time: 04/09/18  5:15 PM  Result Value Ref Range Status   Specimen Description   Final    BLOOD RIGHT HAND Performed at [place] [address] [place], [place] [address]    Special Requests   Final    BOTTLES DRAWN AEROBIC AND ANAEROBIC Blood Culture adequate volume Performed at [place] [address], [place], [place] [address]    Culture   Final    NO GROWTH  5 DAYS Performed at [facility], [place] [address] [place],  [address]    Report Status PENDING  Incomplete         Radiology Studies: No results found.      Scheduled Meds: . sodium chloride   Intravenous Once  . chlorhexidine  15 mL Mouth Rinse BID  . Chlorhexidine Gluconate Cloth  6 each Topical Daily  . diltiazem  10 mg Intravenous Once  . feeding supplement  1 Container Oral TID BM  . guaiFENesin-codeine  5 mL Oral QHS  . insulin aspart  0-9 Units  Subcutaneous Q4H  . mouth rinse  15 mL Mouth Rinse q12n4p  . metoprolol succinate  25 mg Oral Daily  . montelukast  10 mg Oral QHS  . pantoprazole  40 mg Oral BID  . phosphorus  250 mg Oral BID  . saccharomyces boulardii  250 mg Oral BID   Continuous Infusions: . cefTRIAXone (ROCEPHIN)  IV Stopped (04/14/18 1410)     LOS: 7 days     Georgette Shell, MD [facility]  If 7PM-7AM, please contact night-coverage [email or web address] Password [facility] 04/15/2018, 10:04 AM

## 2018-04-15 NOTE — Progress Notes (Signed)
Nutrition Follow-up  DOCUMENTATION CODES:   Non-severe (moderate) malnutrition in context of chronic illness  INTERVENTION:  - Will d/c Boost Breeze. - Will order Ensure Enlive once/day, each supplement provides 350 kcal and 20 grams of protein. - Will order daily multivitamin with minerals.  - Continue to encourage PO intakes.     NUTRITION DIAGNOSIS:   Moderate Malnutrition related to chronic illness, cancer and cancer related treatments as evidenced by mild fat depletion, mild muscle depletion, percent weight loss. -ongoing  GOAL:   Patient will meet greater than or equal to 90% of their needs -unmet at this time.   MONITOR:   PO intake, Supplement acceptance, Weight trends, Labs  ASSESSMENT:   58 y.o. 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 She presented to [facility] ED from her Oncologist's office d/t fever up to 101.4, persistent diarrhea, hypotension, electrolyte imbalance, and severe anemia. She had a root canal 3 weeks ago and was given Amoxicillin. In the ED blood pressures did not respond to aggressive IV fluid hydration and she was started on pressors.  Weight +7.5 kg/16 lb from 3/9-3/16. Diet advanced from CLD to Carb Modified on 3/13 at 7:05 PM. Patient consumed 50% of breakfast and 80% of lunch yesterday (total of 412 kcal and 29 grams of protein). Boost Breeze ordered by this RD during assessment last week, but patient has only been accepting supplement ~25% of the time. Will d/c this supplement and trial Ensure Enlive.   Per Dr. Rodena Piety' note this AM: sepsis with shock now resolved, off pressors since 3/10, severe R lung PNA and early PNA in the L lung, started on rocephin 3/13 with plan for abx x8 days (ending date: 3/20), uncontrolled type 2 MD with HgbA1c of 12.9% although CBGs have been stable while hospitalized, s/p chemo, on and off episodes of diarrhea--on daily antibiotics.      Medications reviewed; sliding scale novolog, 40 mg oral protonix BID, 250 mg KPhos Neutral BID, 250 mg florastor BID. Labs reviewed; CBGs: 79 and 124 mg/dl today, Ca: 8 mg/dl, Alk Phos elevated.      Diet Order:   Diet Order            Diet Carb Modified Fluid consistency: Thin; Room service appropriate? Yes  Diet effective now              EDUCATION NEEDS:   Not appropriate for education at this time  Skin:  Skin Assessment: Reviewed RN Assessment  Last BM:  3/16  Height:   Ht Readings from Last 1 Encounters:  04/14/18 5' 2"  (1.575 m)    Weight:   Wt Readings from Last 1 Encounters:  04/14/18 74.6 kg    Ideal Body Weight:  50 kg  BMI:  Body mass index is 30.08 kg/m.  Estimated Nutritional Needs:   Kcal:  8786-7672 kcal  Protein:  110-125 grams  Fluid:  >/= 2 L/day     Jarome Matin, MS, RD, LDN, [facility] Inpatient Clinical Dietitian Pager # [phone] After hours/weekend pager # [phone]

## 2018-04-15 NOTE — Progress Notes (Addendum)
HEMATOLOGY-ONCOLOGY PROGRESS NOTE  SUBJECTIVE: Feels tired this morning.  States that she did not sleep well because she received a blood transfusion during the night.  Otherwise has no complaints.  Denies bleeding including epistaxis, hemoptysis, hematuria, or hematochezia.Marland Kitchen  Has remained afebrile.  Still has a cough which is intermittent and improving.  Jaundice continues to improve.  Oncology History   Biopsy is ER negative Her2/neu positive     Metastatic breast cancer ([place])   03/11/2018 Imaging    Ct scan of chest, abdomen and pelvis 1. 2.6 x 2.3 cm spiculated mass identified inferomedial quadrant of the left breast. Lesion appears to retract the overlying skin. Mammographic correlation recommended. 2. Small lymph nodes in the left axillar ill-defined and suspicious for metastatic disease. 3. Multiple bilateral pulmonary nodules measuring up to 2.7 cm diameter. These probably represent metastatic disease. Given the dominant size of the central right upper lobe lesion, synchronous lung primary can not be completely excluded. 4. Multiple ill-defined liver lesions compatible with metastatic disease. Dominant liver metastases measure up to almost 4 cm. 5.  Aortic Atherosclerois (ICD10-170.0)     03/12/2018 Pathology Results    Liver, needle/core biopsy, left lobe - METASTATIC CARCINOMA. - LYMPHOVASCULAR INVASION IS IDENTIFIED. - SEE COMMENT. Microscopic Comment The tumor cells are positive for cytokeratin 7. There is faint staining for GATA-3. There is non-specific staining for TTF-1. Cytokeratin 20, CDX-2, estrogen receptor, and GCDFP stains are negative. Given the clinical suspicion, the profile supports a primary breast carcinoma. Her2 will be performed and the results reported separately.  By immunohistochemistry, the tumor cells are POSITIVE for Her2 (3+).    03/12/2018 Imaging    Single 1.3 x 1.4 cm RIGHT occipital metastasis.  Borderline pachymeningeal enhancement of symmetric  nature could be related to the superficial metastasis or osseous disease.    03/12/2018 Procedure    Ultrasound-guided core biopsy performed of a mass within the lateral segment of the left lobe of the liver.    03/16/2018 Imaging    Right occipital lobe 13 x 16 mm. Small satellite enhancing nodule deep to the larger lesion. The larger lesion shows central necrosis and probable mild hemorrhage or calcification.  Mild dural enhancement is less impressive and could be within normal limits.  Bone marrow in the clivus and cervical spine is diffusely low signal. No focal lesion. This may be related to the patient's anemia and abnormal blood count.    03/17/2018 Cancer Staging    Staging form: Breast, AJCC 8th Edition - Clinical: Stage IV (cT2, cN0, pM1, GX, ER-, PR: Not Assessed, HER2+) - Signed by [name], MD on 03/17/2018    03/21/2018 Echocardiogram    IMPRESSIONS 1. The left ventricle has normal systolic function with an ejection fraction of 60-65%. The cavity size was normal. Left ventricular diastolic Doppler parameters are consistent with impaired relaxation.  2. The right ventricle has normal systolic function. The cavity was normal. There is no increase in right ventricular wall thickness.  3. The mitral valve is normal in structure.  4. The tricuspid valve is normal in structure.  5. Strain imaging performed but not reported due to interpreter judgement, secondary to suboptimal image quality.    03/24/2018 Procedure    Successful placement of a right IJ approach Power Port with ultrasound and fluoroscopic guidance. The catheter is ready for use.     Metastasis to brain [facility])   03/17/2018 Initial Diagnosis    Metastasis to brain [facility])     Metastasis to liver (  [place])   03/17/2018 Initial Diagnosis    Metastasis to liver [facility])     Metastasis to bone ([place])   03/17/2018 Initial Diagnosis    Metastasis to bone ([facility])     REVIEW OF SYSTEMS:   Constitutional: Denies fevers,  chills or abnormal weight loss Eyes: Denies blurriness of vision Ears, nose, mouth, throat, and face: Denies mucositis or sore throat Respiratory: Reports cough which is improving.  Denies dyspnea or wheezes Cardiovascular: Denies palpitation, chest discomfort Gastrointestinal:  Denies nausea, heartburn or change in bowel habits Skin: Denies abnormal skin rashes Lymphatics: Denies new lymphadenopathy or easy bruising Neurological:Denies numbness, tingling or new weaknesses Behavioral/Psych: Mood is stable, no new changes  Extremities: No lower extremity edema All other systems were reviewed with the patient and are negative.  I have reviewed the past medical history, past surgical history, social history and family history with the patient and they are unchanged from previous note.   PHYSICAL EXAMINATION:  Vitals:   04/14/18 2353 04/15/18 0320  BP: (!) 113/55 (!) 119/58  Pulse: 91 81  Resp: 20 18  Temp: 99.6 F (37.6 C) 98.4 F (36.9 C)  SpO2: 90% 90%   Filed Weights   04/07/18 1614 04/14/18 1630  Weight: 148 lb (67.1 kg) 164 lb 7.4 oz (74.6 kg)    GENERAL:alert, no distress and comfortable.  SKIN: skin color, texture, turgor are normal, no rashes or significant lesions. She looked pale but less jaundiced EYES: normal, Conjunctiva are pink and non-injected, sclera clear OROPHARYNX:no exudate, no erythema and lips, buccal mucosa, and tongue normal. Noted dried blister on her lips NECK: supple, thyroid normal size, non-tender, without nodularity LYMPH:  no palpable lymphadenopathy in the cervical, axillary or inguinal LUNGS: Reduced breath sounds bilaterally. HEART: regular rate & rhythm and no murmurs and no lower extremity edema ABDOMEN:abdomen soft, non-tender and normal bowel sounds Musculoskeletal:no cyanosis of digits and no clubbing  NEURO: alert & oriented x 3 with fluent speech, no focal motor/sensory deficits  LABORATORY DATA:  I have reviewed the data as  listed CMP Latest Ref Rng & Units 04/15/2018 04/14/2018 04/13/2018  Glucose 70 - 99 mg/dL 121(H) 170(H) 175(H)  BUN 6 - 20 mg/dL _0 Creatinine 0.44 - 1.00 mg/dL 0.45 0.51 0.49  Sodium 135 - 145 mmol/L 138 138 140  Potassium 3.5 - 5.1 mmol/L 4.0 3.9 3.6  Chloride 98 - 111 mmol/L 102 101 102  CO2 22 - 32 mmol/L _1 Calcium 8.9 - 10.3 mg/dL 8.0(L) 7.8(L) 7.8(L)  Total Protein 6.5 - 8.1 g/dL - 5.6(L) 5.2(L)  Total Bilirubin 0.3 - 1.2 mg/dL - 2.8(H) 4.2(H)  Alkaline Phos 38 - 126 U/L - 246(H) 291(H)  AST 15 - 41 U/L - 22 30  ALT 0 - 44 U/L - 22 29    Lab Results  Component Value Date   WBC 8.5 04/15/2018   HGB 8.5 (L) 04/15/2018   HCT 28.0 (L) 04/15/2018   MCV 89.2 04/15/2018   PLT 30 (L) 04/15/2018   NEUTROABS 4.2 04/12/2018    ASSESSMENT AND PLAN: Metastatic breast 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 Continue aggressive supportive care CT imaging shows stable disease. She is due for cycle 2 of chemotherapy this week. It is unlikely she will recover to the point of resuming chemotherapy yet. Due to stability of her disease, I reassured the patient and family that we need to focus on supportive care first  before discussion about resumption of treatment.  Recurrent fever, respiratory distresswithCT imaging confirmed bilateral pneumonia Culturesare positive for Klebsiella She was seen by infectious disease team. Her antibiotics are narrowed to ceftriaxone, total 8 days treatment, last day 3/20  Progressive pancytopenia Likely due to bone marrow suppression from recent chemotherapy, on the background history of bone metastasis.She also have signs of mild hemolysis, due to malignancy and chemotherapy I recommend transfusion support to keep hemoglobin greater than 8; Received 1 unit PRBC on 04/14/2018 for hemoglobin of 7.8. Hemoglobin 8.5 today. Will hold off on transfusion today.  She does not need platelet  transfusion unless platelet count less than 10,000 or she has signs of bleeding  Poorly controlled diabetes She will continue insulin treatment  Multiple electrolyte imbalance Replace as needed  Progressive jaundice, resolving The elevated bilirubin could be due to cholestasis or sepsis CT imaging did not show biliary obstruction Hemolysis from recent transfuse blood is also a possibility Observe only Adjust medication as needed  CODE STATUS Full code  Discharge planning Anticipate she will be in the hospital for the next few days. Overall, she is improving. Has been moved out of ICU.  Encourage patient to get out of bed as much as possible.  PT/OT eval's have very been ordered.  Mikey Bussing, DNP, AGPCNP-BC, AOCNP [name] MD

## 2018-04-15 NOTE — Progress Notes (Signed)
Physical Therapy Treatment Patient Details Name: Stacy Shelton MRN: [id number] DOB: [birth year] Today's Date: 04/15/2018    History of Present Illness 58 y.o. female with medical history significant of metastatic breast cancer to brain, liver, and bone, anemia, DM2, who presented to [facility] ED from her oncologist office due to fever up to 101.4, persistent diarrhea, hypotension, electrolyte imbalance, and severe anemia. Dx of sepsis, PNA.     PT Comments    Pt progressing well with mobility, she tolerated increased ambulation distance of 220' holding IV pole. HR 121 max with walking, no loss of balance. Distance limited by fatigue.   Follow Up Recommendations  No PT follow up     Equipment Recommendations  Rolling walker with 5" wheels    Recommendations for Other Services       Precautions / Restrictions Precautions Precautions: None Precaution Comments: denies h/o falls Restrictions Weight Bearing Restrictions: No    Mobility  Bed Mobility Overal bed mobility: Modified Independent             General bed mobility comments: HOB up 35*   Transfers Overall transfer level: Modified independent Equipment used: None Transfers: Sit to/from Stand Sit to Stand: Modified independent (Device/Increase time)         General transfer comment: used bed rail to push up  Ambulation/Gait Ambulation/Gait assistance: Supervision Gait Distance (Feet): 220 Feet Assistive device: IV Pole Gait Pattern/deviations: Decreased stride length Gait velocity: WFL   General Gait Details: VCs to increase step length, no loss of balance, HR 121 max   Stairs             Wheelchair Mobility    Modified Rankin (Stroke Patients Only)       Balance Overall balance assessment: Modified Independent                                          Cognition Arousal/Alertness: Awake/alert Behavior During Therapy: WFL for tasks assessed/performed Overall Cognitive Status:  Within Functional Limits for tasks assessed                                        Exercises      General Comments        Pertinent Vitals/Pain Pain Score: 0-No pain    Home Living                      Prior Function            PT Goals (current goals can now be found in the care plan section) Acute Rehab PT Goals Patient Stated Goal: to walk her 3 dogs PT Goal Formulation: With patient Time For Goal Achievement: 04/27/18 Potential to Achieve Goals: Good Progress towards PT goals: Progressing toward goals    Frequency    Min 3X/week      PT Plan Current plan remains appropriate    Co-evaluation              AM-PAC PT "6 Clicks" Mobility   Outcome Measure  Help needed turning from your back to your side while in a flat bed without using bedrails?: None Help needed moving from lying on your back to sitting on the side of a flat bed without using bedrails?: None Help needed moving to and  from a bed to a chair (including a wheelchair)?: None Help needed standing up from a chair using your arms (e.g., wheelchair or bedside chair)?: None Help needed to walk in hospital room?: None Help needed climbing 3-5 steps with a railing? : A Little 6 Click Score: 23    End of Session   Activity Tolerance: Patient tolerated treatment well Patient left: with call bell/phone within reach;in chair Nurse Communication: Mobility status PT Visit Diagnosis: Difficulty in walking, not elsewhere classified (R26.2)     Time: 9458-5929 PT Time Calculation (min) (ACUTE ONLY): 22 min  Charges:  $Gait Training: 8-22 mins                     Blondell Reveal Kistler PT 04/15/2018  Acute Rehabilitation Services Pager [phone] Office [phone]

## 2018-04-16 ENCOUNTER — Inpatient Hospital Stay: Payer: BLUE CROSS/BLUE SHIELD

## 2018-04-16 ENCOUNTER — Telehealth: Payer: Self-pay

## 2018-04-16 ENCOUNTER — Ambulatory Visit: Payer: BLUE CROSS/BLUE SHIELD | Admitting: Hematology and Oncology

## 2018-04-16 ENCOUNTER — Other Ambulatory Visit: Payer: BLUE CROSS/BLUE SHIELD

## 2018-04-16 ENCOUNTER — Telehealth: Payer: Self-pay | Admitting: Hematology and Oncology

## 2018-04-16 ENCOUNTER — Telehealth: Payer: Self-pay | Admitting: Radiation Oncology

## 2018-04-16 LAB — CBC
HCT: 29.7 % — ABNORMAL LOW (ref 36.0–46.0)
Hemoglobin: 8.9 g/dL — ABNORMAL LOW (ref 12.0–15.0)
MCH: 27 pg (ref 26.0–34.0)
MCHC: 30 g/dL (ref 30.0–36.0)
MCV: 90 fL (ref 80.0–100.0)
NRBC: 0.3 % — AB (ref 0.0–0.2)
Platelets: 41 10*3/uL — ABNORMAL LOW (ref 150–400)
RBC: 3.3 MIL/uL — ABNORMAL LOW (ref 3.87–5.11)
RDW: 19.9 % — ABNORMAL HIGH (ref 11.5–15.5)
WBC: 11.7 10*3/uL — ABNORMAL HIGH (ref 4.0–10.5)

## 2018-04-16 LAB — GLUCOSE, CAPILLARY
Glucose-Capillary: 135 mg/dL — ABNORMAL HIGH (ref 70–99)
Glucose-Capillary: 142 mg/dL — ABNORMAL HIGH (ref 70–99)
Glucose-Capillary: 170 mg/dL — ABNORMAL HIGH (ref 70–99)

## 2018-04-16 LAB — FUNGUS CULTURE, BLOOD
CULTURE: NO GROWTH
Special Requests: ADEQUATE

## 2018-04-16 MED ORDER — SODIUM CHLORIDE 0.9 % IV SOLN
INTRAVENOUS | Status: DC | PRN
  Administered 2018-04-16: 250 mL via INTRAVENOUS

## 2018-04-16 MED ORDER — LIP MEDEX EX OINT: TOPICAL_OINTMENT | CUTANEOUS | 0 refills | Status: DC | PRN

## 2018-04-16 MED ORDER — LOPERAMIDE HCL 2 MG PO TABS: 2.0000 mg | ORAL_TABLET | Freq: Four times a day (QID) | ORAL | 0 refills | Status: DC | PRN

## 2018-04-16 MED ORDER — HEPARIN SOD (PORK) LOCK FLUSH 100 UNIT/ML IV SOLN
500.0000 [IU] | INTRAVENOUS | Status: AC | PRN
  Administered 2018-04-16: 500 [IU]

## 2018-04-16 MED ORDER — SODIUM CHLORIDE 0.9 % IV SOLN
2.0000 g | Freq: Once | INTRAVENOUS | Status: AC
  Administered 2018-04-16: 2 g via INTRAVENOUS
  Filled 2018-04-16: qty 2

## 2018-04-16 NOTE — Discharge Summary (Signed)
Physician Discharge Summary  Stacy Shelton KNL:[id number] DOB: [birth year] DOA: 04/07/2018  PCP: Robyne Peers, MD  Admit date: 04/07/2018 Discharge date: 04/16/2018  Admitted From: Home Disposition: Home  Recommendations for Outpatient Follow-up:  1. Follow up with PCP in 1-2 weeks 2. Please obtain BMP/CBC in one week   Home Health: No Equipment/Devices: None  Discharge Condition: Stable CODE STATUS: Full Diet recommendation: Diabetic  Brief/Interim Summary:  #) Sepsis and septic shock of unclear etiology: Patient was admitted with fever, diarrhea and hypotension.  Hypotension was not responsive to IV fluids and patient required brief stay in ICU for pressors.  Showed severe right lung multilobar pneumonia as well as possible early multifocal pneumonia of left lung.  Patient was initially started on broad-spectrum IV meropenem and vancomycin which was then narrowed to ceftriaxone.  Blood cultures were negative.  Urine cultures grew out Klebsiella.  Patient completed a total of 8 days of antibiotics per the recommendation of infectious disease.  Patient was discharged home.  #) Anemia/thrombocytopenia: This was attributed to chemotherapy and counts improved during hospitalization.  Patient did require multiple transfusions.  Her counts improved prior to discharge.  #) Diastolic CHF: Patient did not require any diuresis.  #) Type diabetes: Patient was noted to have hemoglobin A1c that was 12.9.  She was continued on Levemir and sliding scale insulin.  She may restart her home diabetes regimen on discharge.  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  Oncology was consulted.  #) Diarrhea: This was intermittent.  Patient was given probiotics.  This resolved on discharge.  Discharge Diagnoses:  Active Problems:   Absolute anemia   Metastatic breast cancer (HCC)   Diarrhea   Sepsis (HCC)   Hypokalemia    Hypomagnesemia   Acute lower UTI   Hypophosphatemia   Septic shock (HCC)   Malnutrition of moderate degree   Acute respiratory failure with hypoxia (HCC)   Acute cystitis without hematuria   Pneumonia of both lungs due to infectious organism    Discharge Instructions  Discharge Instructions    Call MD for:  difficulty breathing, headache or visual disturbances   Complete by:  As directed    Call MD for:  hives   Complete by:  As directed    Call MD for:  persistant dizziness or light-headedness   Complete by:  As directed    Call MD for:  persistant nausea and vomiting   Complete by:  As directed    Call MD for:  redness, tenderness, or signs of infection (pain, swelling, redness, odor or green/yellow discharge around incision site)   Complete by:  As directed    Call MD for:  severe uncontrolled pain   Complete by:  As directed    Call MD for:  temperature >100.4   Complete by:  As directed    Diet Carb Modified   Complete by:  As directed    Discharge instructions   Complete by:  As directed    Please follow-up with your primary care doctor in 1 week.   Increase activity slowly   Complete by:  As directed      Allergies as of 04/16/2018      Reactions   Nsaids Anaphylaxis      Medication List    STOP taking these medications   ondansetron 8 MG tablet Commonly known as:  Zofran   prochlorperazine 10 MG tablet Commonly known as:  COMPAZINE     TAKE these medications   Accu-Chek Aviva Plus test strip Generic drug:  glucose blood   Accu-Chek Softclix Lancets lancets   albuterol 108 (90 Base) MCG/ACT inhaler Commonly known as:  ProAir HFA Inhale 2 puffs into the lungs every 6 (six) hours as needed for wheezing or shortness of breath.   blood glucose meter kit and supplies Kit Dispense based on patient and insurance preference. Use up to four times daily as directed. (FOR ICD-9 250.00, 250.01).   dexamethasone 4 MG tablet Commonly known as:  DECADRON Take  1 tablet in the morning before chemo, none on the day of chemo and daily in the morning after chemo for 2 days, with food   esomeprazole 40 MG capsule Commonly known as:  NEXIUM Take 1 capsule (40 mg total) by mouth 2 (two) times daily before a meal.   fluticasone 44 MCG/ACT inhaler Commonly known as:  Flovent HFA Inhale 2 puffs into the lungs 2 (two) times daily.   insulin aspart 100 UNIT/ML FlexPen Commonly known as:  NOVOLOG Inject 8 Units into the skin 3 (three) times daily with meals.   Insulin Detemir 100 UNIT/ML Pen Commonly known as:  LEVEMIR Inject 20 Units into the skin daily.   Insulin Pen Needle 31G X 5 MM Misc 1 Device by Does not apply route QID. For use with insulin pens   lidocaine-prilocaine cream Commonly known as:  EMLA Apply to affected area once   lip balm ointment Apply topically as needed for lip care.   LORazepam 0.5 MG tablet Commonly known as:  Ativan Take 1 tab po 30 minutes prior to radiation or MRI   metFORMIN 500 MG tablet Commonly known as:  GLUCOPHAGE Take 1 tablet (500 mg total) by mouth 2 (two) times daily with a meal.   metoprolol succinate 50 MG 24 hr tablet Commonly known as:  TOPROL-XL Take 50 mg by mouth every evening.   montelukast 10 MG tablet Commonly known as:  SINGULAIR TAKE 1 TABLET BY MOUTH EVERYDAY AT BEDTIME What changed:  See the new instructions.   multivitamin with minerals Tabs tablet Take 1 tablet by mouth daily.   ranitidine 300 MG tablet Commonly known as:  ZANTAC Take 1 tablet (300 mg total) by mouth at bedtime. What changed:    when to take this  reasons to take this       Allergies  Allergen Reactions  . Nsaids Anaphylaxis    Consultations:  Infectious disease  Oncology   Procedures/Studies: Ct Head Wo Contrast  Result Date: 04/08/2018 CLINICAL DATA:  Sepsis with known metastatic breast cancer EXAM: CT HEAD WITHOUT CONTRAST TECHNIQUE: Contiguous axial images were obtained from the base  of the skull through the vertex without intravenous contrast. COMPARISON:  Brain MRI 03/16/2018 FINDINGS: Brain: There is focal hypoattenuation in the right occipital lobe, corresponding to the site of the known metastatic lesion. No acute hemorrhage or other extra-axial collection. No midline shift or significant mass effect. Normal size of the ventricles. Vascular: No abnormal hyperdensity of the major intracranial arteries or dural venous sinuses. No intracranial atherosclerosis. Skull: The visualized skull base, calvarium and extracranial soft tissues are normal. Sinuses/Orbits: Postsurgical changes of the maxillary and ethmoid sinuses. No mastoid or middle ear effusion. The orbits are normal. IMPRESSION: 1. Hypoattenuation in the right occipital lobe, corresponding to known metastatic lesion. 2. No acute hemorrhage. Electronically Signed   By: Ulyses Jarred M.D.   On: 04/08/2018 19:18   Ct Chest  W Contrast  Result Date: 04/08/2018 CLINICAL DATA:  58 year old female with metastatic breast cancer, fever, sepsis. EXAM: CT CHEST, ABDOMEN, AND PELVIS WITH CONTRAST TECHNIQUE: Multidetector CT imaging of the chest, abdomen and pelvis was performed following the standard protocol during bolus administration of intravenous contrast. CONTRAST:  164m OMNIPAQUE IOHEXOL 300 MG/ML  SOLN COMPARISON:  Portable chest 04/07/2018. CT Chest, Abdomen, and Pelvis 03/11/2018. FINDINGS: CT CHEST FINDINGS Cardiovascular: No pericardial effusion. Stable cardiac size. Negative thoracic aorta. Central pulmonary arteries are patent. Right IJ chest porta cath in place. Mediastinum/Nodes: Small but progressed since February right paratracheal lymph nodes individually 9 millimeter short axis. No other mediastinal lymphadenopathy; postinflammatory coarsely calcified subcarinal and right hilar nodes are stable. Lungs/Pleura: New subtotal consolidation of the right upper and lower lobes with superimposed small layering right pleural  effusion. This obscures some of the right lung nodule/metastases seen in February. Patchy discontinuous peribronchial opacity with some consolidation in the right middle lobe is new. Major airways are patent. New consolidation in the medial basal segment of the left lower lobe with additional new indistinct peribronchial ground-glass and nodules in the other left lower lobe segments. Similar nodularity in the lingula with some early consolidation. In the left upper lobe some of the formally circumscribed metastatic appearing nodules now are indistinct with marginal ground-glass opacity (series 4, image 47). This is also true of the 1 or 2 visible right lung nodules. Musculoskeletal: No acute or destructive osseous lesion identified. New subcutaneous edema along the left lateral chest wall. Left axillary lymphadenopathy has mildly regressed. CT ABDOMEN PELVIS FINDINGS Hepatobiliary: Mild hepatomegaly. Trace perihepatic free fluid. Multiple hypodense liver metastases redemonstrated. The largest have not significantly changed in size or configuration. No definite new hepatic metastasis. Gallbladder remains within normal limits. Pancreas: Negative. Spleen: Stable mild splenic enlargement and small calcified granulomas. Adrenals/Urinary Tract: Negative adrenal glands. Bilateral renal enhancement and contrast excretion is symmetric and within normal limits. Benign left renal parapelvic cysts. Decompressed proximal ureters. Unremarkable urinary bladder. Stomach/Bowel: Sigmoid diverticulosis with no active inflammation. Mild motion artifact in the upper abdomen. No dilated or definitely inflamed large or small bowel loops. Decompressed stomach and duodenum. No free air. Trace perihepatic free fluid. Vascular/Lymphatic: Mild Aortoiliac calcified atherosclerosis. Major arterial structures are patent. Portal venous system seems patent. No lymphadenopathy. Reproductive: Stable and within normal limits. Other: No pelvic free  fluid. Musculoskeletal: No acute or destructive osseous lesion identified. Mild bilateral subcutaneous flank edema. IMPRESSION: 1. Severe right lung multilobar pneumonia. Multifocal early pneumonia in the left lung. Associated small layering right pleural effusion. 2. Evolution of the pulmonary nodules/presumed pulmonary metastases visible today, with irregular and indistinct margins. This might reflect treatment affect. 3. Mildly enlarged right paratracheal mediastinal nodes, favor reactive rather than metastatic. 4. Stable liver metastases. Mild hepatomegaly and trace perihepatic free fluid. 5. No new metastatic disease identified. Electronically Signed   By: HGenevie AnnM.D.   On: 04/08/2018 19:32   Ct Abdomen Pelvis W Contrast  Result Date: 04/08/2018 CLINICAL DATA:  58year old female with metastatic breast cancer, fever, sepsis. EXAM: CT CHEST, ABDOMEN, AND PELVIS WITH CONTRAST TECHNIQUE: Multidetector CT imaging of the chest, abdomen and pelvis was performed following the standard protocol during bolus administration of intravenous contrast. CONTRAST:  1036mOMNIPAQUE IOHEXOL 300 MG/ML  SOLN COMPARISON:  Portable chest 04/07/2018. CT Chest, Abdomen, and Pelvis 03/11/2018. FINDINGS: CT CHEST FINDINGS Cardiovascular: No pericardial effusion. Stable cardiac size. Negative thoracic aorta. Central pulmonary arteries are patent. Right IJ chest porta cath in place. Mediastinum/Nodes:  Small but progressed since February right paratracheal lymph nodes individually 9 millimeter short axis. No other mediastinal lymphadenopathy; postinflammatory coarsely calcified subcarinal and right hilar nodes are stable. Lungs/Pleura: New subtotal consolidation of the right upper and lower lobes with superimposed small layering right pleural effusion. This obscures some of the right lung nodule/metastases seen in February. Patchy discontinuous peribronchial opacity with some consolidation in the right middle lobe is new. Major  airways are patent. New consolidation in the medial basal segment of the left lower lobe with additional new indistinct peribronchial ground-glass and nodules in the other left lower lobe segments. Similar nodularity in the lingula with some early consolidation. In the left upper lobe some of the formally circumscribed metastatic appearing nodules now are indistinct with marginal ground-glass opacity (series 4, image 47). This is also true of the 1 or 2 visible right lung nodules. Musculoskeletal: No acute or destructive osseous lesion identified. New subcutaneous edema along the left lateral chest wall. Left axillary lymphadenopathy has mildly regressed. CT ABDOMEN PELVIS FINDINGS Hepatobiliary: Mild hepatomegaly. Trace perihepatic free fluid. Multiple hypodense liver metastases redemonstrated. The largest have not significantly changed in size or configuration. No definite new hepatic metastasis. Gallbladder remains within normal limits. Pancreas: Negative. Spleen: Stable mild splenic enlargement and small calcified granulomas. Adrenals/Urinary Tract: Negative adrenal glands. Bilateral renal enhancement and contrast excretion is symmetric and within normal limits. Benign left renal parapelvic cysts. Decompressed proximal ureters. Unremarkable urinary bladder. Stomach/Bowel: Sigmoid diverticulosis with no active inflammation. Mild motion artifact in the upper abdomen. No dilated or definitely inflamed large or small bowel loops. Decompressed stomach and duodenum. No free air. Trace perihepatic free fluid. Vascular/Lymphatic: Mild Aortoiliac calcified atherosclerosis. Major arterial structures are patent. Portal venous system seems patent. No lymphadenopathy. Reproductive: Stable and within normal limits. Other: No pelvic free fluid. Musculoskeletal: No acute or destructive osseous lesion identified. Mild bilateral subcutaneous flank edema. IMPRESSION: 1. Severe right lung multilobar pneumonia. Multifocal early  pneumonia in the left lung. Associated small layering right pleural effusion. 2. Evolution of the pulmonary nodules/presumed pulmonary metastases visible today, with irregular and indistinct margins. This might reflect treatment affect. 3. Mildly enlarged right paratracheal mediastinal nodes, favor reactive rather than metastatic. 4. Stable liver metastases. Mild hepatomegaly and trace perihepatic free fluid. 5. No new metastatic disease identified. Electronically Signed   By: Genevie Ann M.D.   On: 04/08/2018 19:32   Dg Chest Port 1 View  Result Date: 04/12/2018 CLINICAL DATA:  Respiratory failure EXAM: PORTABLE CHEST 1 VIEW COMPARISON:  04/11/2018 FINDINGS: Multifocal opacities, right upper lobe and left lower lobe predominant, stable versus mildly improved. No definite pleural effusions. No pneumothorax. The heart is normal in size. Right chest power port terminates in the lower SVC. IMPRESSION: Stable multifocal patchy opacity/pneumonia, right upper lobe predominant. Electronically Signed   By: Julian Hy M.D.   On: 04/12/2018 05:22   Dg Chest Port 1 View  Result Date: 04/11/2018 CLINICAL DATA:  Respiratory failure EXAM: PORTABLE CHEST 1 VIEW COMPARISON:  Two days ago FINDINGS: Extensive airspace disease reflecting pneumonia by CT. There may be small pleural effusions. Worsening volumes and increasing density. Porta catheter with tip at the SVC. Borderline heart size. Calcified mediastinal lymph nodes. IMPRESSION: Extensive pneumonia with worsening aeration. Electronically Signed   By: Monte [name] M.D.   On: 04/11/2018 07:48   Dg Chest Port 1 View  Result Date: 04/09/2018 CLINICAL DATA:  Increasing shortness of breath EXAM: PORTABLE CHEST 1 VIEW COMPARISON:  04/07/2018 FINDINGS: Progressing consolidation and volume  loss in the right upper lung since prior study. This likely represents progression of pneumonia. A centrally obstructing lesion is not excluded and follow-up after resolution is  recommended. Calcified granulomas in the right lung and right hilum. Left lung is clear. Heart size and pulmonary vascularity are normal. No blunting of costophrenic angles. No pneumothorax. Power port type central venous catheter with tip over the low SVC region. IMPRESSION: Progressing consolidation and volume loss in the right upper lung since prior study. Electronically Signed   By: Lucienne Capers M.D.   On: 04/09/2018 20:43   Dg Chest Port 1 View  Result Date: 04/07/2018 CLINICAL DATA:  Breast cancer with metastases.  Fever. EXAM: PORTABLE CHEST 1 VIEW COMPARISON:  CT scan March 11, 2018 FINDINGS: There is an oval hazy opacity in the right upper lobe which correlates with the site of a previously identified nodule, likely a metastatic lesion. No other definitive pulmonary nodules. The heart, hila, and mediastinum are unremarkable. No pneumothorax. The right Port-A-Cath terminates in the SVC. No other acute abnormalities. IMPRESSION: 1. The ill-defined hazy rounded opacity in the right upper lobe correlates with a previously identified nodule thought to be a metastatic lesion. No other abnormalities identified on today's study. Electronically Signed   By: Dorise Bullion III M.D   On: 04/07/2018 16:54   Ir Imaging Guided Port Insertion  Result Date: 03/24/2018 INDICATION: 58 year old female with metastatic breast cancer. She presents for placement of a portacatheter to provide durable venous access for palliative chemotherapy. EXAM: IMPLANTED PORT A CATH PLACEMENT WITH ULTRASOUND AND FLUOROSCOPIC GUIDANCE MEDICATIONS: 2 g Ancef; The antibiotic was administered within an appropriate time interval prior to skin puncture. ANESTHESIA/SEDATION: Versed 2 mg IV; Fentanyl 180 mcg IV; Moderate Sedation Time:  24 minutes The patient was continuously monitored during the procedure by the interventional radiology nurse under my direct supervision. FLUOROSCOPY TIME:  1 minutes, 0 seconds (3 mGy) COMPLICATIONS:  None immediate. PROCEDURE: The right neck and chest was prepped with chlorhexidine, and draped in the usual sterile fashion using maximum barrier technique (cap and mask, sterile gown, sterile gloves, large sterile sheet, hand hygiene and cutaneous antiseptic). Local anesthesia was attained by infiltration with 1% lidocaine with epinephrine. Ultrasound demonstrated patency of the right internal jugular vein, and this was documented with an image. Under real-time ultrasound guidance, this vein was accessed with a 21 gauge micropuncture needle and image documentation was performed. A small dermatotomy was made at the access site with an 11 scalpel. A 0.018" wire was advanced into the SVC and the access needle exchanged for a 92F micropuncture vascular sheath. The 0.018" wire was then removed and a 0.035" wire advanced into the IVC. An appropriate location for the subcutaneous reservoir was selected below the clavicle and an incision was made through the skin and underlying soft tissues. The subcutaneous tissues were then dissected using a combination of blunt and sharp surgical technique and a pocket was formed. A single lumen power injectable portacatheter was then tunneled through the subcutaneous tissues from the pocket to the dermatotomy and the port reservoir placed within the subcutaneous pocket. The venous access site was then serially dilated and a peel away vascular sheath placed over the wire. The wire was removed and the port catheter advanced into position under fluoroscopic guidance. The catheter tip is positioned in the superior cavoatrial junction. This was documented with a spot image. The portacatheter was then tested and found to flush and aspirate well. The port was flushed with saline followed  by 100 units/mL heparinized saline. The pocket was then closed in two layers using first subdermal inverted interrupted absorbable sutures followed by a running subcuticular suture. The epidermis was then  sealed with Dermabond. The dermatotomy at the venous access site was also closed with Dermabond. IMPRESSION: Successful placement of a right IJ approach Power Port with ultrasound and fluoroscopic guidance. The catheter is ready for use. Signed, Criselda Peaches, MD, [facility] Electronically Signed   By: Jacqulynn Cadet M.D.   On: 03/24/2018 09:29     Subjective:   Discharge Exam: Vitals:   04/15/18 1951 04/16/18 0504  BP: (!) 131/56 (!) 124/57  Pulse: 90 92  Resp: 16 16  Temp: 99.3 F (37.4 C) 98.8 F (37.1 C)  SpO2: 95% 98%   Vitals:   04/15/18 0320 04/15/18 1538 04/15/18 1951 04/16/18 0504  BP: (!) 119/58 (!) 124/57 (!) 131/56 (!) 124/57  Pulse: 81 93 90 92  Resp: _0 Temp: 98.4 F (36.9 C) 98.9 F (37.2 C) 99.3 F (37.4 C) 98.8 F (37.1 C)  TempSrc: Oral Oral Oral Oral  SpO2: 90% 93% 95% 98%  Weight:      Height:        General: Pt is alert, awake, not in acute distress Cardiovascular: RRR, S1/S2 +, no rubs, no gallops Respiratory: CTA bilaterally, no wheezing, no rhonchi Abdominal: Soft, NT, ND, bowel sounds + Extremities: no edema, port site is clean dry and intact    The results of significant diagnostics from this hospitalization (including imaging, microbiology, ancillary and laboratory) are listed below for reference.     Microbiology: Recent Results (from the past 240 hour(s))  Urine Culture     Status: Abnormal   Collection Time: 04/07/18 11:15 AM  Result Value Ref Range Status   Specimen Description   Final    URINE, CLEAN CATCH Performed at [facility], [address] [place], [place] [address]    Special Requests   Final    NONE Performed at [facility], [place] [address], [place], [place] [address]    Culture >=100,000 COLONIES/mL KLEBSIELLA PNEUMONIAE (A)  Final   Report Status 04/10/2018 FINAL  Final   Organism  ID, Bacteria KLEBSIELLA PNEUMONIAE (A)  Final      Susceptibility   Klebsiella pneumoniae - MIC*    AMPICILLIN >=32 RESISTANT Resistant     CEFAZOLIN <=4 SENSITIVE Sensitive     CEFTRIAXONE <=1 SENSITIVE Sensitive     CIPROFLOXACIN <=0.25 SENSITIVE Sensitive     GENTAMICIN <=1 SENSITIVE Sensitive     IMIPENEM <=0.25 SENSITIVE Sensitive     NITROFURANTOIN <=16 SENSITIVE Sensitive     TRIMETH/SULFA <=20 SENSITIVE Sensitive     AMPICILLIN/SULBACTAM 8 SENSITIVE Sensitive     PIP/TAZO <=4 SENSITIVE Sensitive     Extended ESBL NEGATIVE Sensitive     * >=100,000 COLONIES/mL KLEBSIELLA PNEUMONIAE  Culture, Blood     Status: None   Collection Time: 04/07/18 11:27 AM  Result Value Ref Range Status   Specimen Description   Final    BLOOD RIGHT ARM Performed at [facility], [place] [address] [place], [place] [address]    Special Requests   Final    BOTTLES DRAWN AEROBIC AND ANAEROBIC Blood Culture results may not be optimal due to an excessive volume of blood received in culture bottles   Culture   Final    NO GROWTH  5 DAYS Performed at [facility], [place] [address] [place], [place] [address]    Report Status 04/12/2018 FINAL  Final  Culture, Blood     Status: None   Collection Time: 04/07/18 12:15 PM  Result Value Ref Range Status   Specimen Description   Final    PORTA CATH Performed at [facility], [place] [address], [place], [place] [address]    Special Requests   Final    BOTTLES DRAWN AEROBIC AND ANAEROBIC Blood Culture adequate volume   Culture   Final    NO GROWTH 5 DAYS Performed at [facility], [place] [address] [place], [place] [address]    Report Status 04/12/2018 FINAL  Final  MRSA PCR Screening     Status: None   Collection Time: 04/07/18  8:41 PM  Result Value Ref Range Status   MRSA by PCR NEGATIVE NEGATIVE Final    Comment:        The GeneXpert MRSA Assay (FDA approved for NASAL specimens only), is one  component of a comprehensive MRSA colonization surveillance program. It is not intended to diagnose MRSA infection nor to guide or monitor treatment for MRSA infections. Performed at [facility], [place] [address] [place], [place] [address]   Gastrointestinal Panel by PCR , Stool     Status: None   Collection Time: 04/08/18  1:06 AM  Result Value Ref Range Status   Campylobacter species NOT DETECTED NOT DETECTED Final   Plesimonas shigelloides NOT DETECTED NOT DETECTED Final   Salmonella species NOT DETECTED NOT DETECTED Final   Yersinia enterocolitica NOT DETECTED NOT DETECTED Final   Vibrio species NOT DETECTED NOT DETECTED Final   Vibrio cholerae NOT DETECTED NOT DETECTED Final   Enteroaggregative E coli (EAEC) NOT DETECTED NOT DETECTED Final   Enteropathogenic E coli (EPEC) NOT DETECTED NOT DETECTED Final   Enterotoxigenic E coli (ETEC) NOT DETECTED NOT DETECTED Final   Shiga like toxin producing E coli (STEC) NOT DETECTED NOT DETECTED Final   Shigella/Enteroinvasive E coli (EIEC) NOT DETECTED NOT DETECTED Final   Cryptosporidium NOT DETECTED NOT DETECTED Final   Cyclospora cayetanensis NOT DETECTED NOT DETECTED Final   Entamoeba histolytica NOT DETECTED NOT DETECTED Final   Giardia lamblia NOT DETECTED NOT DETECTED Final   Adenovirus F40/41 NOT DETECTED NOT DETECTED Final   Astrovirus NOT DETECTED NOT DETECTED Final   Norovirus GI/GII NOT DETECTED NOT DETECTED Final   Rotavirus A NOT DETECTED NOT DETECTED Final   Sapovirus (I, II, IV, and V) NOT DETECTED NOT DETECTED Final    Comment: Performed at [facility], [address] [place], [place] [address]  Respiratory Panel by PCR     Status: None   Collection Time: 04/08/18  1:06 AM  Result Value Ref Range Status   Adenovirus NOT DETECTED NOT DETECTED Final   Coronavirus 229E NOT DETECTED NOT DETECTED Final    Comment: (NOTE) The Coronavirus on the Respiratory Panel, DOES NOT test for the novel   Coronavirus (2019 nCoV)    Coronavirus HKU1 NOT DETECTED NOT DETECTED Final   Coronavirus NL63 NOT DETECTED NOT DETECTED Final   Coronavirus OC43 NOT DETECTED NOT DETECTED Final   Metapneumovirus NOT DETECTED NOT DETECTED Final   Rhinovirus / Enterovirus NOT DETECTED NOT DETECTED Final   Influenza A NOT DETECTED NOT DETECTED Final   Influenza B NOT DETECTED NOT DETECTED Final   Parainfluenza Virus 1 NOT DETECTED NOT DETECTED Final  Parainfluenza Virus 2 NOT DETECTED NOT DETECTED Final   Parainfluenza Virus 3 NOT DETECTED NOT DETECTED Final   Parainfluenza Virus 4 NOT DETECTED NOT DETECTED Final   Respiratory Syncytial Virus NOT DETECTED NOT DETECTED Final   Bordetella pertussis NOT DETECTED NOT DETECTED Final   Chlamydophila pneumoniae NOT DETECTED NOT DETECTED Final   Mycoplasma pneumoniae NOT DETECTED NOT DETECTED Final    Comment: Performed at [facility], [place] [address], [place], [place] [address]  C difficile quick scan w PCR reflex     Status: None   Collection Time: 04/08/18  2:30 PM  Result Value Ref Range Status   C Diff antigen NEGATIVE NEGATIVE Final   C Diff toxin NEGATIVE NEGATIVE Final   C Diff interpretation No C. difficile detected.  Final    Comment: Performed at [facility], [place] [address] [place], [place] [address]  Fungus culture, blood     Status: None   Collection Time: 04/09/18  5:15 PM  Result Value Ref Range Status   Specimen Description   Final    BLOOD RIGHT HAND Performed at [place] [address], [place], [place] [address]    Special Requests   Final    BOTTLES DRAWN AEROBIC AND ANAEROBIC Blood Culture adequate volume Performed at [place] [address], [place], [place] [address]    Culture   Final    NO GROWTH 7 DAYS NO FUNGUS ISOLATED Performed at [facility], [place] [address], [place], [place] [address]    Report Status 04/16/2018 FINAL  Final      Labs: BNP (last 3 results) No results for input(s): BNP in the last 8760 hours. Basic Metabolic Panel: Recent Labs  Lab 04/10/18 0552 04/11/18 0415 04/12/18 0500 04/13/18 0500 04/14/18 1221 04/15/18 0445  NA 143 141 138 140 138 138  K 2.9* 2.9* 3.3* 3.6 3.9 4.0  CL 110 103 99 102 101 102  CO2 _0 GLUCOSE 109* 156* 235* 175* 170* 121*  BUN 16 18 21* _1 CREATININE 0.67 0.51 0.45 0.49 0.51 0.45  CALCIUM 7.8* 7.7* 7.9* 7.8* 7.8* 8.0*  MG 2.1 1.8 2.1  --   --   --   PHOS  --  1.7* 1.7* 2.6  --   --    Liver Function Tests: Recent Labs  Lab 04/10/18 0552 04/11/18 0415 04/12/18 0500 04/13/18 0500 04/14/18 1221  AST 20 54* 35 30 22  ALT 17 35 _2 ALKPHOS 50 91 201* 291* 246*  BILITOT 5.5* 7.9* 6.9* 4.2* 2.8*  PROT 5.9* 5.5* 5.2* 5.2* 5.6*  ALBUMIN 2.4* 2.1* 2.2* 2.1* 2.2*   No results for input(s): LIPASE, AMYLASE in the last 168 hours. No results for input(s): AMMONIA in the last 168 hours. CBC: Recent Labs  Lab 04/12/18 0500 04/13/18 0500 04/14/18 1221 04/15/18 0445 04/16/18 0334  WBC 8.9 8.2 8.0 8.5 11.7*  NEUTROABS 4.2  --   --   --   --   HGB 8.3* 8.1* 7.8* 8.5* 8.9*  HCT 27.0* 26.0* 24.8* 28.0* 29.7*  MCV 88.8 87.8 88.3 89.2 90.0  PLT 33* 32* 30* 30* 41*   Cardiac Enzymes: Recent Labs  Lab 04/10/18 0552  CKTOTAL 11*  CKMB 0.9   BNP: Invalid input(s): POCBNP CBG: Recent Labs  Lab 04/15/18 1615 04/15/18 1950 04/15/18 2355 04/16/18 0501 04/16/18 0757  GLUCAP 194* 214* 217* 135* 142*  D-Dimer No results for input(s): DDIMER in the last 72 hours. Hgb A1c No results for input(s): HGBA1C in the last 72 hours. Lipid Profile No results for input(s): CHOL, HDL, LDLCALC, TRIG, CHOLHDL, LDLDIRECT in the last 72 hours. Thyroid function studies No results for input(s): TSH, T4TOTAL, T3FREE, THYROIDAB in the last 72 hours.  Invalid input(s): FREET3 Anemia work up No results for input(s): VITAMINB12, FOLATE, FERRITIN,  TIBC, IRON, RETICCTPCT in the last 72 hours. Urinalysis    Component Value Date/Time   COLORURINE AMBER (A) 04/08/2018 0216   APPEARANCEUR HAZY (A) 04/08/2018 0216   LABSPEC 1.019 04/08/2018 0216   PHURINE 5.0 04/08/2018 0216   GLUCOSEU 50 (A) 04/08/2018 0216   HGBUR SMALL (A) 04/08/2018 0216   BILIRUBINUR NEGATIVE 04/08/2018 0216   KETONESUR 5 (A) 04/08/2018 0216   PROTEINUR 30 (A) 04/08/2018 0216   NITRITE NEGATIVE 04/08/2018 0216   LEUKOCYTESUR NEGATIVE 04/08/2018 0216   Sepsis Labs Invalid input(s): PROCALCITONIN,  WBC,  LACTICIDVEN Microbiology Recent Results (from the past 240 hour(s))  Urine Culture     Status: Abnormal   Collection Time: 04/07/18 11:15 AM  Result Value Ref Range Status   Specimen Description   Final    URINE, CLEAN CATCH Performed at [facility], [place] [address], [place], [place] [address]    Special Requests   Final    NONE Performed at [facility], [place] [address] [place], [place] [address]    Culture >=100,000 COLONIES/mL KLEBSIELLA PNEUMONIAE (A)  Final   Report Status 04/10/2018 FINAL  Final   Organism ID, Bacteria KLEBSIELLA PNEUMONIAE (A)  Final      Susceptibility   Klebsiella pneumoniae - MIC*    AMPICILLIN >=32 RESISTANT Resistant     CEFAZOLIN <=4 SENSITIVE Sensitive     CEFTRIAXONE <=1 SENSITIVE Sensitive     CIPROFLOXACIN <=0.25 SENSITIVE Sensitive     GENTAMICIN <=1 SENSITIVE Sensitive     IMIPENEM <=0.25 SENSITIVE Sensitive     NITROFURANTOIN <=16 SENSITIVE Sensitive     TRIMETH/SULFA <=20 SENSITIVE Sensitive     AMPICILLIN/SULBACTAM 8 SENSITIVE Sensitive     PIP/TAZO <=4 SENSITIVE Sensitive     Extended ESBL NEGATIVE Sensitive     * >=100,000 COLONIES/mL KLEBSIELLA PNEUMONIAE  Culture, Blood     Status: None   Collection Time: 04/07/18 11:27 AM  Result Value Ref Range Status   Specimen Description   Final    BLOOD RIGHT ARM Performed at [facility],  [place] [address], [place], [place] [address]    Special Requests   Final    BOTTLES DRAWN AEROBIC AND ANAEROBIC Blood Culture results may not be optimal due to an excessive volume of blood received in culture bottles   Culture   Final    NO GROWTH 5 DAYS Performed at [facility], [place] [address] [place], [place] [address]    Report Status 04/12/2018 FINAL  Final  Culture, Blood     Status: None   Collection Time: 04/07/18 12:15 PM  Result Value Ref Range Status   Specimen Description   Final    PORTA CATH Performed at [facility], [place] [address] [place], [place] [address]    Special Requests   Final    BOTTLES DRAWN AEROBIC AND ANAEROBIC Blood Culture adequate volume   Culture   Final    NO GROWTH 5 DAYS Performed at [facility], [place],  [place] [address]    Report Status 04/12/2018 FINAL  Final  MRSA PCR Screening     Status: None   Collection Time: 04/07/18  8:41 PM  Result Value Ref Range Status   MRSA by PCR NEGATIVE NEGATIVE Final    Comment:        The GeneXpert MRSA Assay (FDA approved for NASAL specimens only), is one component of a comprehensive MRSA colonization surveillance program. It is not intended to diagnose MRSA infection nor to guide or monitor treatment for MRSA infections. Performed at [facility], [place] [address] [place], [place] [address]   Gastrointestinal Panel by PCR , Stool     Status: None   Collection Time: 04/08/18  1:06 AM  Result Value Ref Range Status   Campylobacter species NOT DETECTED NOT DETECTED Final   Plesimonas shigelloides NOT DETECTED NOT DETECTED Final   Salmonella species NOT DETECTED NOT DETECTED Final   Yersinia enterocolitica NOT DETECTED NOT DETECTED Final   Vibrio species NOT DETECTED NOT DETECTED Final   Vibrio cholerae NOT DETECTED NOT DETECTED Final   Enteroaggregative E coli (EAEC) NOT DETECTED NOT DETECTED Final   Enteropathogenic E coli  (EPEC) NOT DETECTED NOT DETECTED Final   Enterotoxigenic E coli (ETEC) NOT DETECTED NOT DETECTED Final   Shiga like toxin producing E coli (STEC) NOT DETECTED NOT DETECTED Final   Shigella/Enteroinvasive E coli (EIEC) NOT DETECTED NOT DETECTED Final   Cryptosporidium NOT DETECTED NOT DETECTED Final   Cyclospora cayetanensis NOT DETECTED NOT DETECTED Final   Entamoeba histolytica NOT DETECTED NOT DETECTED Final   Giardia lamblia NOT DETECTED NOT DETECTED Final   Adenovirus F40/41 NOT DETECTED NOT DETECTED Final   Astrovirus NOT DETECTED NOT DETECTED Final   Norovirus GI/GII NOT DETECTED NOT DETECTED Final   Rotavirus A NOT DETECTED NOT DETECTED Final   Sapovirus (I, II, IV, and V) NOT DETECTED NOT DETECTED Final    Comment: Performed at [facility], [place] [place], [place] [address]  Respiratory Panel by PCR     Status: None   Collection Time: 04/08/18  1:06 AM  Result Value Ref Range Status   Adenovirus NOT DETECTED NOT DETECTED Final   Coronavirus 229E NOT DETECTED NOT DETECTED Final    Comment: (NOTE) The Coronavirus on the Respiratory Panel, DOES NOT test for the novel  Coronavirus (2019 nCoV)    Coronavirus HKU1 NOT DETECTED NOT DETECTED Final   Coronavirus NL63 NOT DETECTED NOT DETECTED Final   Coronavirus OC43 NOT DETECTED NOT DETECTED Final   Metapneumovirus NOT DETECTED NOT DETECTED Final   Rhinovirus / Enterovirus NOT DETECTED NOT DETECTED Final   Influenza A NOT DETECTED NOT DETECTED Final   Influenza B NOT DETECTED NOT DETECTED Final   Parainfluenza Virus 1 NOT DETECTED NOT DETECTED Final   Parainfluenza Virus 2 NOT DETECTED NOT DETECTED Final   Parainfluenza Virus 3 NOT DETECTED NOT DETECTED Final   Parainfluenza Virus 4 NOT DETECTED NOT DETECTED Final   Respiratory Syncytial Virus NOT DETECTED NOT DETECTED Final   Bordetella pertussis NOT DETECTED NOT DETECTED Final   Chlamydophila pneumoniae NOT DETECTED NOT DETECTED Final   Mycoplasma pneumoniae  NOT DETECTED NOT DETECTED Final    Comment: Performed at [facility], [place] [address] [place],  [address]  C difficile quick scan w PCR reflex     Status: None   Collection Time: 04/08/18  2:30 PM  Result Value Ref Range Status   C Diff antigen NEGATIVE  NEGATIVE Final   C Diff toxin NEGATIVE NEGATIVE Final   C Diff interpretation No C. difficile detected.  Final    Comment: Performed at [facility], [place] [address], [place], [place] [address]  Fungus culture, blood     Status: None   Collection Time: 04/09/18  5:15 PM  Result Value Ref Range Status   Specimen Description   Final    BLOOD RIGHT HAND Performed at [place] [address] [place], [place] [address]    Special Requests   Final    BOTTLES DRAWN AEROBIC AND ANAEROBIC Blood Culture adequate volume Performed at [place] [address], [place], [place] [address]    Culture   Final    NO GROWTH 7 DAYS NO FUNGUS ISOLATED Performed at [facility], [place] [address], [place], [place] [address]    Report Status 04/16/2018 FINAL  Final     Time coordinating discharge: 36  SIGNED:   Cristy Folks, MD  [facility] 04/16/2018, 11:03 AM   If 7PM-7AM, please contact night-coverage [email or web address] Password [email or web address]

## 2018-04-16 NOTE — Telephone Encounter (Signed)
Scheduled appt per 3/18 sch message - unable to reach patient . Left message with appt date and time

## 2018-04-16 NOTE — Telephone Encounter (Signed)
Sister Stacy Shelton called and left a message that her sister is being d/ced home today. She is upset that her sister is being d/ced.  Called 4 th floor and spoke with Stacy Shelton's nurse. She will give the above message to Dr. Herbert Moors.  Called Stacy Shelton and told her that her sister called and is upset that is she going home today. Per Dr. Alvy Bimler, told her Dr. Alvy Bimler thought the antibiotic's finished on Friday, she spoke with the hospitalist this morning and he told her they finished today. Dr. Alvy Bimler told him that she is ready to go home. Staying in the hospital is more dangerous than being in at home. Offered appt with Dr. Alvy Bimler for tomorrow or Friday. Stacy Shelton declined appt and will call the office if she needs anything. She asked that I call Stacy Shelton and give message from Dr. Alvy Bimler. She is upset and thinks that no one wants to come and get her.  Stacy Shelton, sister and given above message from Dr. Alvy Bimler. She is still upset and Stacy Shelton is going home today. Given Stacy Shelton Long 4 th floor number for her to call and speak with nurse.

## 2018-04-16 NOTE — Progress Notes (Addendum)
HEMATOLOGY-ONCOLOGY PROGRESS NOTE  SUBJECTIVE: Feeling better this morning.  Had dry cracked lips overnight.  Had a small amount of bleeding from her lips but this is now dried up.  She otherwise denies bleeding including epistaxis, hemoptysis, hematuria, or hematochezia.Marland Kitchen  Has remained afebrile.  Still has a cough which is intermittent and improving; reports that the cough is worse at night.  Jaundice continues to improve.  Oncology History   Biopsy is ER negative Her2/neu positive     Metastatic breast cancer ([place])   03/11/2018 Imaging    Ct scan of chest, abdomen and pelvis 1. 2.6 x 2.3 cm spiculated mass identified inferomedial quadrant of the left breast. Lesion appears to retract the overlying skin. Mammographic correlation recommended. 2. Small lymph nodes in the left axillar ill-defined and suspicious for metastatic disease. 3. Multiple bilateral pulmonary nodules measuring up to 2.7 cm diameter. These probably represent metastatic disease. Given the dominant size of the central right upper lobe lesion, synchronous lung primary can not be completely excluded. 4. Multiple ill-defined liver lesions compatible with metastatic disease. Dominant liver metastases measure up to almost 4 cm. 5.  Aortic Atherosclerois (ICD10-170.0)     03/12/2018 Pathology Results    Liver, needle/core biopsy, left lobe - METASTATIC CARCINOMA. - LYMPHOVASCULAR INVASION IS IDENTIFIED. - SEE COMMENT. Microscopic Comment The tumor cells are positive for cytokeratin 7. There is faint staining for GATA-3. There is non-specific staining for TTF-1. Cytokeratin 20, CDX-2, estrogen receptor, and GCDFP stains are negative. Given the clinical suspicion, the profile supports a primary breast carcinoma. Her2 will be performed and the results reported separately.  By immunohistochemistry, the tumor cells are POSITIVE for Her2 (3+).    03/12/2018 Imaging    Single 1.3 x 1.4 cm RIGHT occipital metastasis.  Borderline  pachymeningeal enhancement of symmetric nature could be related to the superficial metastasis or osseous disease.    03/12/2018 Procedure    Ultrasound-guided core biopsy performed of a mass within the lateral segment of the left lobe of the liver.    03/16/2018 Imaging    Right occipital lobe 13 x 16 mm. Small satellite enhancing nodule deep to the larger lesion. The larger lesion shows central necrosis and probable mild hemorrhage or calcification.  Mild dural enhancement is less impressive and could be within normal limits.  Bone marrow in the clivus and cervical spine is diffusely low signal. No focal lesion. This may be related to the patient's anemia and abnormal blood count.    03/17/2018 Cancer Staging    Staging form: Breast, AJCC 8th Edition - Clinical: Stage IV (cT2, cN0, pM1, GX, ER-, PR: Not Assessed, HER2+) - Signed by [name], MD on 03/17/2018    03/21/2018 Echocardiogram    IMPRESSIONS 1. The left ventricle has normal systolic function with an ejection fraction of 60-65%. The cavity size was normal. Left ventricular diastolic Doppler parameters are consistent with impaired relaxation.  2. The right ventricle has normal systolic function. The cavity was normal. There is no increase in right ventricular wall thickness.  3. The mitral valve is normal in structure.  4. The tricuspid valve is normal in structure.  5. Strain imaging performed but not reported due to interpreter judgement, secondary to suboptimal image quality.    03/24/2018 Procedure    Successful placement of a right IJ approach Power Port with ultrasound and fluoroscopic guidance. The catheter is ready for use.     Metastasis to brain [facility])   03/17/2018 Initial Diagnosis    Metastasis  to brain (HCC)     Metastasis to liver (HCC)   03/17/2018 Initial Diagnosis    Metastasis to liver (HCC)     Metastasis to bone (HCC)   03/17/2018 Initial Diagnosis    Metastasis to bone (HCC)     REVIEW OF SYSTEMS:    Constitutional: Denies fevers, chills or abnormal weight loss Eyes: Denies blurriness of vision Ears, nose, mouth, throat, and face: Denies mucositis or sore throat.  Has dry, cracked lips.  A small amount of bleeding from her lips overnight.  No active bleeding. Respiratory: Reports cough which is improving.  Denies dyspnea or wheezes.  Cardiovascular: Denies palpitation, chest discomfort Gastrointestinal:  Denies nausea, heartburn or change in bowel habits Skin: Denies abnormal skin rashes Lymphatics: Denies new lymphadenopathy or easy bruising Neurological:Denies numbness, tingling or new weaknesses Behavioral/Psych: Mood is stable, no new changes  Extremities: No lower extremity edema All other systems were reviewed with the patient and are negative.  I have reviewed the past medical history, past surgical history, social history and family history with the patient and they are unchanged from previous note.   PHYSICAL EXAMINATION:  Vitals:   04/15/18 1951 04/16/18 0504  BP: (!) 131/56 (!) 124/57  Pulse: 90 92  Resp: 16 16  Temp: 99.3 F (37.4 C) 98.8 F (37.1 C)  SpO2: 95% 98%   Filed Weights   04/07/18 1614 04/14/18 1630  Weight: 148 lb (67.1 kg) 164 lb 7.4 oz (74.6 kg)    GENERAL:alert, no distress and comfortable.  SKIN: skin color, texture, turgor are normal, no rashes or significant lesions. She looked pale but less jaundiced EYES: normal, Conjunctiva are pink and non-injected, sclera clear OROPHARYNX: Dried blood noted on her lips.  No exudate, no erythema, buccal mucosa, and tongue normal. Noted dried blister on her lips NECK: supple, thyroid normal size, non-tender, without nodularity LYMPH:  no palpable lymphadenopathy in the cervical, axillary or inguinal LUNGS: Reduced breath sounds bilaterally. HEART: regular rate & rhythm and no murmurs and no lower extremity edema ABDOMEN:abdomen soft, non-tender and normal bowel sounds Musculoskeletal:no cyanosis of  digits and no clubbing  NEURO: alert & oriented x 3 with fluent speech, no focal motor/sensory deficits  LABORATORY DATA:  I have reviewed the data as listed CMP Latest Ref Rng & Units 04/15/2018 04/14/2018 04/13/2018  Glucose 70 - 99 mg/dL 121(H) 170(H) 175(H)  BUN 6 - 20 mg/dL 12 15 18  Creatinine 0.44 - 1.00 mg/dL 0.45 0.51 0.49  Sodium 135 - 145 mmol/L 138 138 140  Potassium 3.5 - 5.1 mmol/L 4.0 3.9 3.6  Chloride 98 - 111 mmol/L 102 101 102  CO2 22 - 32 mmol/L 29 29 29  Calcium 8.9 - 10.3 mg/dL 8.0(L) 7.8(L) 7.8(L)  Total Protein 6.5 - 8.1 g/dL - 5.6(L) 5.2(L)  Total Bilirubin 0.3 - 1.2 mg/dL - 2.8(H) 4.2(H)  Alkaline Phos 38 - 126 U/L - 246(H) 291(H)  AST 15 - 41 U/L - 22 30  ALT 0 - 44 U/L - 22 29    Lab Results  Component Value Date   WBC 11.7 (H) 04/16/2018   HGB 8.9 (L) 04/16/2018   HCT 29.7 (L) 04/16/2018   MCV 90.0 04/16/2018   PLT 41 (L) 04/16/2018   NEUTROABS 4.2 04/12/2018    ASSESSMENT AND PLAN: Metastatic breast 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 Continue aggressive supportive care CT imaging shows stable disease. She is due for cycle 2 of   chemotherapy this week. It is unlikely she will recover to the point of resuming chemotherapy yet. Due to stability of her disease, I reassured the patient and family that we need to focus on supportive care first before discussion about resumption of treatment. Planning next week out patient  Recurrent fever, respiratory distresswithCT imaging confirmed bilateral pneumonia Culturesare positive for Klebsiella She was seen by infectious disease team. Her antibiotics are narrowed to ceftriaxone, total 8 days treatment, last day 3/20  Progressive pancytopenia Likely due to bone marrow suppression from recent chemotherapy, on the background history of bone metastasis.She also have signs of mild hemolysis, due to malignancy and chemotherapy I recommend transfusion  support to keep hemoglobin greater than 8; Received 1 unit PRBC on 04/14/2018 for hemoglobin of 7.8. Hemoglobin 8.9 today. Will hold off on transfusion today.  She does not need platelet transfusion unless platelet count less than 10,000 or she has signs of bleeding  Poorly controlled diabetes She will continue insulin treatment  Multiple electrolyte imbalance Replace as needed  Progressive jaundice, resolving The elevated bilirubin could be due to cholestasis or sepsis CT imaging did not show biliary obstruction Hemolysis from recent transfuse blood is also a possibility Observe only Adjust medication as needed  CODE STATUS Full code  Discharge planning Anticipate she will be in the hospital for the next few days.  Has not completed her IV ceftriaxone per infectious disease. Overall, she is improving.   Encourage patient to get out of bed as much as possible.  PT/OT eval's have very been ordered. Hopefully DC after IV antibiotics are finished on 3/20  Kristin Curcio, DNP, AGPCNP-BC, AOCNP     

## 2018-04-16 NOTE — Progress Notes (Signed)
Pt discharged home today per Dr. Herbert Moors. Pt's IV site D/C'd and WDL and Port de accessed and flushed. Pt's VSS. Pt provided with home medication list, discharge instructions and prescriptions. Verbalized understanding. Pt left floor via WC in stable condition accompanied by NT.

## 2018-04-16 NOTE — Discharge Instructions (Signed)
Blood Glucose Monitoring, Adult Monitoring your blood sugar (glucose) is an important part of managing your diabetes (diabetes mellitus). Blood glucose monitoring involves checking your blood glucose as often as directed and keeping a record (log) of your results over time. Checking your blood glucose regularly and keeping a blood glucose log can:  Help you and your health care provider adjust your diabetes management plan as needed, including your medicines or insulin.  Help you understand how food, exercise, illnesses, and medicines affect your blood glucose.  Let you know what your blood glucose is at any time. You can quickly find out if you have low blood glucose (hypoglycemia) or high blood glucose (hyperglycemia). Your health care provider will set individualized treatment goals for you. Your goals will be based on your age, other medical conditions you have, and how you respond to diabetes treatment. Generally, the goal of treatment is to maintain the following blood glucose levels:  Before meals (preprandial): 80-130 mg/dL (4.4-7.2 mmol/L).  After meals (postprandial): below 180 mg/dL (10 mmol/L).  A1c level: less than 7%. Supplies needed:  Blood glucose meter.  Test strips for your meter. Each meter has its own strips. You must use the strips that came with your meter.  A needle to prick your finger (lancet). Do not use a lancet more than one time.  A device that holds the lancet (lancing device).  A journal or log book to write down your results. How to check your blood glucose  1. Wash your hands with soap and water. 2. Prick the side of your finger (not the tip) with the lancet. Use a different finger each time. 3. Gently rub the finger until a small drop of blood appears. 4. Follow instructions that come with your meter for inserting the test strip, applying blood to the strip, and using your blood glucose meter. 5. Write down your result and any notes. Some meters  allow you to use areas of your body other than your finger (alternative sites) to test your blood. The most common alternative sites are:  Forearm.  Thigh.  Palm of the hand. If you think you may have hypoglycemia, or if you have a history of not knowing when your blood glucose is getting low (hypoglycemia unawareness), do not use alternative sites. Use your finger instead. Alternative sites may not be as accurate as the fingers, because blood flow is slower in these areas. This means that the result you get may be delayed, and it may be different from the result that you would get from your finger. Follow these instructions at home: Blood glucose log   Every time you check your blood glucose, write down your result. Also write down any notes about things that may be affecting your blood glucose, such as your diet and exercise for the day. This information can help you and your health care provider: ? Look for patterns in your blood glucose over time. ? Adjust your diabetes management plan as needed.  Check if your meter allows you to download your records to a computer. Most glucose meters store a record of glucose readings in the meter. If you have type 1 diabetes:  Check your blood glucose 2 or more times a day.  Also check your blood glucose: ? Before every insulin injection. ? Before and after exercise. ? Before meals. ? 2 hours after a meal. ? Occasionally between 2:00 a.m. and 3:00 a.m., as directed. ? Before potentially dangerous tasks, like driving or using heavy machinery. ?   At bedtime.  You may need to check your blood glucose more often, up to 6-10 times a day, if you: ? Use an insulin pump. ? Need multiple daily injections (MDI). ? Have diabetes that is not well-controlled. ? Are ill. ? Have a history of severe hypoglycemia. ? Have hypoglycemia unawareness. If you have type 2 diabetes:  If you take insulin or other diabetes medicines, check your blood glucose 2 or  more times a day.  If you are on intensive insulin therapy, check your blood glucose 4 or more times a day. Occasionally, you may also need to check between 2:00 a.m. and 3:00 a.m., as directed.  Also check your blood glucose: ? Before and after exercise. ? Before potentially dangerous tasks, like driving or using heavy machinery.  You may need to check your blood glucose more often if: ? Your medicine is being adjusted. ? Your diabetes is not well-controlled. ? You are ill. General tips  Always keep your supplies with you.  If you have questions or need help, all blood glucose meters have a 24-hour "hotline" phone number that you can call. You may also contact your health care provider.  After you use a few boxes of test strips, adjust (calibrate) your blood glucose meter by following instructions that came with your meter. Contact a health care provider if:  Your blood glucose is at or above 240 mg/dL (13.3 mmol/L) for 2 days in a row.  You have been sick or have had a fever for 2 days or longer, and you are not getting better.  You have any of the following problems for more than 6 hours: ? You cannot eat or drink. ? You have nausea or vomiting. ? You have diarrhea. Get help right away if:  Your blood glucose is lower than 54 mg/dL (3 mmol/L).  You become confused or you have trouble thinking clearly.  You have difficulty breathing.  You have moderate or large ketone levels in your urine. Summary  Monitoring your blood sugar (glucose) is an important part of managing your diabetes (diabetes mellitus).  Blood glucose monitoring involves checking your blood glucose as often as directed and keeping a record (log) of your results over time.  Your health care provider will set individualized treatment goals for you. Your goals will be based on your age, other medical conditions you have, and how you respond to diabetes treatment.  Every time you check your blood glucose,  write down your result. Also write down any notes about things that may be affecting your blood glucose, such as your diet and exercise for the day. This information is not intended to replace advice given to you by your health care provider. Make sure you discuss any questions you have with your health care provider. Document Released: 01/18/2003 Document Revised: 11/26/2016 Document Reviewed: 06/27/2015 Elsevier Interactive Patient Education  2019 [employer]vier Inc.  

## 2018-04-16 NOTE — Progress Notes (Signed)
Physical Therapy Treatment Patient Details Name: Stacy Shelton MRN: [id number] DOB: 1960/06/22 Today's Date: 04/16/2018    History of Present Illness 58 y.o. 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 Dx of sepsis, PNA.     PT Comments    PT goals met, PT signing off. Encouraged pt to ambulate in halls independently 3x/day to minimize deconditioning. Pt ambulated 400' today, SaO2 88% on room air walking, no dyspnea.   Follow Up Recommendations  No PT follow up     Equipment Recommendations  None recommended by PT    Recommendations for Other Services       Precautions / Restrictions Precautions Precautions: None Precaution Comments: denies h/o falls Restrictions Weight Bearing Restrictions: No    Mobility  Bed Mobility Overal bed mobility: Modified Independent             General bed mobility comments: HOB up 35*   Transfers Overall transfer level: Independent Equipment used: None Transfers: Sit to/from Stand Sit to Stand: Independent            Ambulation/Gait Ambulation/Gait assistance: Independent;Modified independent (Device/Increase time) Gait Distance (Feet): 400 Feet Assistive device: IV Pole;None Gait Pattern/deviations: WFL(Within Functional Limits) Gait velocity: WFL   General Gait Details: 250' with IV pole, 150' with no AD, SaO2 88% on room air walking, no dyspnea, no loss of balance   Stairs             Wheelchair Mobility    Modified Rankin (Stroke Patients Only)       Balance Overall balance assessment: Independent                                          Cognition Arousal/Alertness: Awake/alert Behavior During Therapy: WFL for tasks assessed/performed Overall Cognitive Status: Within  Functional Limits for tasks assessed                                        Exercises      General Comments        Pertinent Vitals/Pain Pain Assessment: No/denies pain    Home Living                      Prior Function            PT Goals (current goals can now be found in the care plan section)      Frequency    Min 3X/week      PT Plan Current plan remains appropriate    Co-evaluation              AM-PAC PT "6 Clicks" Mobility   Outcome Measure  Help needed turning from your back to your side while in a flat bed without using bedrails?: None Help needed moving from lying on your back to sitting on the side of a flat bed without using bedrails?: None Help needed moving to and from a bed to a chair (including a wheelchair)?: None Help needed standing up from a chair using your arms (e.g., wheelchair or bedside chair)?: None Help needed to walk in hospital room?:  None Help needed climbing 3-5 steps with a railing? : None 6 Click Score: 24    End of Session   Activity Tolerance: Patient tolerated treatment well Patient left: with call bell/phone within reach;in chair Nurse Communication: Mobility status PT Visit Diagnosis: Difficulty in walking, not elsewhere classified (R26.2)     Time: 0952-1005 PT Time Calculation (min) (ACUTE ONLY): 13 min  Charges:  $Gait Training: 8-22 mins                     ,  Kistler PT 04/16/2018  Acute Rehabilitation Services Pager [phone] Office [phone]   

## 2018-04-16 NOTE — Telephone Encounter (Signed)
I spoke with the patient today by phone. I was calling to let her know we've been advised to cancel nonurgent follow up appointments and we discussed her case and she has done very well since her [place] treatment to the brain. She is currently hospitalized with pneumonia and is hoping to discharge soon. Her pulmonary function appears to be clinically improving as well. I let her know that I'd include Dr. Lisbeth Renshaw on details of her hospitalization as well. We will cancel her appt for next week and see her back following her next surveillance brain MRI in about 2 months.    Carola Rhine, PAC

## 2018-04-17 ENCOUNTER — Telehealth: Payer: Self-pay | Admitting: Hematology and Oncology

## 2018-04-17 ENCOUNTER — Other Ambulatory Visit: Payer: Self-pay | Admitting: Hematology and Oncology

## 2018-04-17 NOTE — Telephone Encounter (Signed)
I called to check on the patient today.  She is feeling much better.  Energy level is fair She still has some mild shortness of breath and cough but no fever or chills. She had some loose stool last night of which she took Imodium. I offered to bring the patient in today for follow-up but she felt strong enough that she did not have any major concerns I will call to check on her again tomorrow.

## 2018-04-18 ENCOUNTER — Telehealth: Payer: Self-pay | Admitting: Hematology and Oncology

## 2018-04-18 NOTE — Op Note (Signed)
Name: Stacy Shelton    MRN: [id number]   Date: 03/21/2018    DOB: July 19, 1960   STEREOTACTIC RADIOSURGERY OPERATIVE NOTE  PRE-OPERATIVE DIAGNOSIS:  Metastatic Breast CA  POST-OPERATIVE DIAGNOSIS:  Same  PROCEDURE:  Stereotactic Radiosurgery  SURGEON:  Consuella Lose, MD  RADIATION ONCOLOGIST: Dr. Kyung Rudd, MD  TECHNIQUE:  The patient underwent a radiation treatment planning session in the radiation oncology simulation suite under the care of the radiation oncology physician and physicist.  I participated closely in the radiation treatment planning afterwards. The patient underwent planning CT which was fused to 3T high resolution MRI with 1 mm axial slices.  These images were fused on the planning system.  We contoured the gross target volumes and subsequently expanded this to yield the Planning Target Volume. I actively participated in the planning process.  I helped to define and review the target contours and also the contours of the optic pathway, eyes, brainstem and selected nearby organs at risk.  All the dose constraints for critical structures were reviewed and compared to AAPM Task Group 101.  The prescription dose conformity was reviewed.  I approved the plan electronically.    Accordingly, Stacy Shelton  was brought to the TrueBeam stereotactic radiation treatment linac and placed in the custom immobilization mask.  The patient was aligned according to the IR fiducial markers with BrainLab Exactrac, then orthogonal x-rays were used in ExacTrac with the 6DOF robotic table and the shifts were made to align the patient  Stacy Shelton received stereotactic radiosurgery to a prescription dose of 20 Gy to the right occipital lesion uneventfully.    The detailed description of the procedure is recorded in the radiation oncology procedure note.  I was present for the duration of the procedure.  DISPOSITION:   Following delivery, the patient was transported to nursing in stable condition and  monitored for possible acute effects to be discharged to home in stable condition with follow-up in one month.  Consuella Lose, MD [facility]

## 2018-04-18 NOTE — Addendum Note (Signed)
Encounter addended by: Consuella Lose, MD on: 04/18/2018 11:02 AM  Actions taken: Clinical Note Signed

## 2018-04-18 NOTE — Telephone Encounter (Signed)
Spoke with the patient again.  She felt better.  Denies fever or chills.  Her cough and shortness of breath is slowly improving.  I will see her as scheduled next week.  I have addressed all her questions and concerns.

## 2018-04-21 ENCOUNTER — Ambulatory Visit: Payer: Self-pay | Admitting: Radiation Oncology

## 2018-04-23 ENCOUNTER — Telehealth: Payer: Self-pay | Admitting: *Deleted

## 2018-04-23 ENCOUNTER — Telehealth: Payer: Self-pay

## 2018-04-23 NOTE — Telephone Encounter (Signed)
Pt called reporting dry cough keeping her up at night x 3 nights.  No fever, body aches or chills, CP or SHOB, no n/v/d.  Pt given the option to come in today and be seen by Dr Alvy Bimler.  Pt states she'd rather wait until tomorrow.  Pt will keep already scheduled appt's for tomorrow.

## 2018-04-23 NOTE — Telephone Encounter (Signed)
"  DENNISHA MOUSER [phone]).  Discharged last Wednesday after pneumonia to both lungs.  Still experiencing cough.  Coughing clear phlegm all night.  Ribs ache; right more than left from coughing.  Not sleeping and have no energy.   Temperature equal 98.4 when I woke up.  Nothing OTC used for pain and Tylenol's effect on liver I'm not to use Tylenol.  Is there something to help these symptoms?" No cough or distress noted with call.  Reports cough worse at night.  Medication list inhalers for asthma being used daily.

## 2018-04-24 ENCOUNTER — Inpatient Hospital Stay: Payer: BLUE CROSS/BLUE SHIELD | Admitting: Hematology and Oncology

## 2018-04-24 ENCOUNTER — Inpatient Hospital Stay (HOSPITAL_COMMUNITY)
Admission: EM | Admit: 2018-04-24 | Discharge: 2018-05-06 | DRG: 193 | Disposition: A | Discharge status: Home Health | Payer: BLUE CROSS/BLUE SHIELD | Attending: Internal Medicine | Admitting: Internal Medicine

## 2018-04-24 ENCOUNTER — Other Ambulatory Visit: Payer: Self-pay

## 2018-04-24 ENCOUNTER — Emergency Department (HOSPITAL_COMMUNITY): Payer: BLUE CROSS/BLUE SHIELD

## 2018-04-24 ENCOUNTER — Inpatient Hospital Stay: Payer: BLUE CROSS/BLUE SHIELD

## 2018-04-24 ENCOUNTER — Encounter (HOSPITAL_COMMUNITY): Payer: Self-pay | Admitting: Internal Medicine

## 2018-04-24 DIAGNOSIS — R6889 Other general symptoms and signs: Secondary | ICD-10-CM | POA: Diagnosis not present

## 2018-04-24 DIAGNOSIS — D649 Anemia, unspecified: Secondary | ICD-10-CM | POA: Diagnosis not present

## 2018-04-24 DIAGNOSIS — J9601 Acute respiratory failure with hypoxia: Secondary | ICD-10-CM

## 2018-04-24 DIAGNOSIS — C787 Secondary malignant neoplasm of liver and intrahepatic bile duct: Secondary | ICD-10-CM | POA: Diagnosis present

## 2018-04-24 DIAGNOSIS — I110 Hypertensive heart disease with heart failure: Secondary | ICD-10-CM | POA: Diagnosis present

## 2018-04-24 DIAGNOSIS — C50819 Malignant neoplasm of overlapping sites of unspecified female breast: Secondary | ICD-10-CM | POA: Diagnosis present

## 2018-04-24 DIAGNOSIS — Z794 Long term (current) use of insulin: Secondary | ICD-10-CM | POA: Diagnosis not present

## 2018-04-24 DIAGNOSIS — J390 Retropharyngeal and parapharyngeal abscess: Secondary | ICD-10-CM | POA: Diagnosis present

## 2018-04-24 DIAGNOSIS — R0682 Tachypnea, not elsewhere classified: Secondary | ICD-10-CM

## 2018-04-24 DIAGNOSIS — T451X5A Adverse effect of antineoplastic and immunosuppressive drugs, initial encounter: Secondary | ICD-10-CM | POA: Diagnosis present

## 2018-04-24 DIAGNOSIS — I471 Supraventricular tachycardia: Secondary | ICD-10-CM | POA: Diagnosis not present

## 2018-04-24 DIAGNOSIS — Z886 Allergy status to analgesic agent status: Secondary | ICD-10-CM

## 2018-04-24 DIAGNOSIS — I509 Heart failure, unspecified: Secondary | ICD-10-CM | POA: Diagnosis not present

## 2018-04-24 DIAGNOSIS — J189 Pneumonia, unspecified organism: Principal | ICD-10-CM

## 2018-04-24 DIAGNOSIS — C7800 Secondary malignant neoplasm of unspecified lung: Secondary | ICD-10-CM | POA: Diagnosis present

## 2018-04-24 DIAGNOSIS — E440 Moderate protein-calorie malnutrition: Secondary | ICD-10-CM | POA: Diagnosis present

## 2018-04-24 DIAGNOSIS — D61818 Other pancytopenia: Secondary | ICD-10-CM | POA: Diagnosis present

## 2018-04-24 DIAGNOSIS — J9690 Respiratory failure, unspecified, unspecified whether with hypoxia or hypercapnia: Secondary | ICD-10-CM | POA: Diagnosis not present

## 2018-04-24 DIAGNOSIS — D6959 Other secondary thrombocytopenia: Secondary | ICD-10-CM | POA: Diagnosis present

## 2018-04-24 DIAGNOSIS — C7951 Secondary malignant neoplasm of bone: Secondary | ICD-10-CM | POA: Diagnosis present

## 2018-04-24 DIAGNOSIS — C7931 Secondary malignant neoplasm of brain: Secondary | ICD-10-CM | POA: Diagnosis present

## 2018-04-24 DIAGNOSIS — R0902 Hypoxemia: Secondary | ICD-10-CM | POA: Diagnosis not present

## 2018-04-24 DIAGNOSIS — E1165 Type 2 diabetes mellitus with hyperglycemia: Secondary | ICD-10-CM | POA: Diagnosis present

## 2018-04-24 DIAGNOSIS — R221 Localized swelling, mass and lump, neck: Secondary | ICD-10-CM | POA: Diagnosis not present

## 2018-04-24 DIAGNOSIS — R509 Fever, unspecified: Secondary | ICD-10-CM | POA: Diagnosis present

## 2018-04-24 DIAGNOSIS — D6481 Anemia due to antineoplastic chemotherapy: Secondary | ICD-10-CM | POA: Diagnosis present

## 2018-04-24 DIAGNOSIS — J918 Pleural effusion in other conditions classified elsewhere: Secondary | ICD-10-CM | POA: Diagnosis not present

## 2018-04-24 DIAGNOSIS — J181 Lobar pneumonia, unspecified organism: Secondary | ICD-10-CM

## 2018-04-24 DIAGNOSIS — Z923 Personal history of irradiation: Secondary | ICD-10-CM

## 2018-04-24 DIAGNOSIS — R502 Drug induced fever: Secondary | ICD-10-CM | POA: Diagnosis not present

## 2018-04-24 DIAGNOSIS — Z171 Estrogen receptor negative status [ER-]: Secondary | ICD-10-CM

## 2018-04-24 DIAGNOSIS — E118 Type 2 diabetes mellitus with unspecified complications: Secondary | ICD-10-CM | POA: Diagnosis not present

## 2018-04-24 DIAGNOSIS — Z20828 Contact with and (suspected) exposure to other viral communicable diseases: Secondary | ICD-10-CM | POA: Diagnosis present

## 2018-04-24 DIAGNOSIS — J440 Chronic obstructive pulmonary disease with acute lower respiratory infection: Secondary | ICD-10-CM | POA: Diagnosis present

## 2018-04-24 DIAGNOSIS — E876 Hypokalemia: Secondary | ICD-10-CM | POA: Diagnosis not present

## 2018-04-24 DIAGNOSIS — Z9889 Other specified postprocedural states: Secondary | ICD-10-CM

## 2018-04-24 DIAGNOSIS — I5032 Chronic diastolic (congestive) heart failure: Secondary | ICD-10-CM | POA: Diagnosis present

## 2018-04-24 DIAGNOSIS — E878 Other disorders of electrolyte and fluid balance, not elsewhere classified: Secondary | ICD-10-CM | POA: Diagnosis not present

## 2018-04-24 DIAGNOSIS — R0603 Acute respiratory distress: Secondary | ICD-10-CM | POA: Diagnosis not present

## 2018-04-24 DIAGNOSIS — I959 Hypotension, unspecified: Secondary | ICD-10-CM | POA: Diagnosis not present

## 2018-04-24 DIAGNOSIS — C50919 Malignant neoplasm of unspecified site of unspecified female breast: Secondary | ICD-10-CM | POA: Diagnosis not present

## 2018-04-24 DIAGNOSIS — Z9981 Dependence on supplemental oxygen: Secondary | ICD-10-CM | POA: Diagnosis not present

## 2018-04-24 DIAGNOSIS — Z95828 Presence of other vascular implants and grafts: Secondary | ICD-10-CM | POA: Diagnosis not present

## 2018-04-24 DIAGNOSIS — D72829 Elevated white blood cell count, unspecified: Secondary | ICD-10-CM | POA: Diagnosis not present

## 2018-04-24 DIAGNOSIS — R5081 Fever presenting with conditions classified elsewhere: Secondary | ICD-10-CM | POA: Diagnosis not present

## 2018-04-24 DIAGNOSIS — L659 Nonscarring hair loss, unspecified: Secondary | ICD-10-CM | POA: Diagnosis not present

## 2018-04-24 HISTORY — DX: Other seasonal allergic rhinitis: J30.2

## 2018-04-24 HISTORY — DX: Type 2 diabetes mellitus without complications: E11.9

## 2018-04-24 HISTORY — DX: Malignant neoplasm of unspecified site of unspecified female breast: C50.919

## 2018-04-24 HISTORY — DX: Unspecified asthma, uncomplicated: J45.909

## 2018-04-24 LAB — COMPREHENSIVE METABOLIC PANEL
ALT: 34 U/L (ref 0–44)
AST: 33 U/L (ref 15–41)
Albumin: 2.9 g/dL — ABNORMAL LOW (ref 3.5–5.0)
Alkaline Phosphatase: 247 U/L — ABNORMAL HIGH (ref 38–126)
Anion gap: 9 (ref 5–15)
BUN: 11 mg/dL (ref 6–20)
CO2: 27 mmol/L (ref 22–32)
Calcium: 8.8 mg/dL — ABNORMAL LOW (ref 8.9–10.3)
Chloride: 97 mmol/L — ABNORMAL LOW (ref 98–111)
Creatinine, Ser: 0.57 mg/dL (ref 0.44–1.00)
GFR calc Af Amer: >60 mL/min (ref >60)
GFR calc non Af Amer: >60 mL/min (ref >60)
Glucose, Bld: 138 mg/dL — ABNORMAL HIGH (ref 70–99)
POTASSIUM: 3.9 mmol/L (ref 3.5–5.1)
SODIUM: 133 mmol/L — AB (ref 135–145)
Total Bilirubin: 2.1 mg/dL — ABNORMAL HIGH (ref 0.3–1.2)
Total Protein: 7.3 g/dL (ref 6.5–8.1)

## 2018-04-24 LAB — CBC WITH DIFFERENTIAL/PLATELET
Abs Immature Granulocytes: 0.1 10*3/uL — ABNORMAL HIGH (ref 0.00–0.07)
Band Neutrophils: 7 %
Basophils Absolute: 0 10*3/uL (ref 0.0–0.1)
Basophils Relative: 0 %
Eosinophils Absolute: 0.1 10*3/uL (ref 0.0–0.5)
Eosinophils Relative: 1 %
HCT: 25.1 % — ABNORMAL LOW (ref 36.0–46.0)
Hemoglobin: 7.5 g/dL — ABNORMAL LOW (ref 12.0–15.0)
Lymphocytes Relative: 20 %
Lymphs Abs: 1.1 10*3/uL (ref 0.7–4.0)
MCH: 26.5 pg (ref 26.0–34.0)
MCHC: 29.9 g/dL — AB (ref 30.0–36.0)
MCV: 88.7 fL (ref 80.0–100.0)
Monocytes Absolute: 0.6 10*3/uL (ref 0.1–1.0)
Monocytes Relative: 10 %
Myelocytes: 2 %
NEUTROS PCT: 59 %
NRBC: 0.5 % — AB (ref 0.0–0.2)
Neutro Abs: 3.7 10*3/uL (ref 1.7–7.7)
Other: 1 %
Platelets: 77 10*3/uL — ABNORMAL LOW (ref 150–400)
RBC: 2.83 MIL/uL — ABNORMAL LOW (ref 3.87–5.11)
RDW: 18.9 % — AB (ref 11.5–15.5)
WBC: 5.6 10*3/uL (ref 4.0–10.5)

## 2018-04-24 LAB — URINALYSIS, ROUTINE W REFLEX MICROSCOPIC
Bilirubin Urine: NEGATIVE
Glucose, UA: NEGATIVE mg/dL
HGB URINE DIPSTICK: NEGATIVE
Ketones, ur: 5 mg/dL — AB
Leukocytes,Ua: NEGATIVE
Nitrite: NEGATIVE
Protein, ur: NEGATIVE mg/dL
Specific Gravity, Urine: 1.016 (ref 1.005–1.030)
pH: 5 (ref 5.0–8.0)

## 2018-04-24 LAB — GLUCOSE, CAPILLARY
Glucose-Capillary: 105 mg/dL — ABNORMAL HIGH (ref 70–99)
Glucose-Capillary: 170 mg/dL — ABNORMAL HIGH (ref 70–99)

## 2018-04-24 LAB — LACTIC ACID, PLASMA
Lactic Acid, Venous: 0.9 mmol/L (ref 0.5–1.9)
Lactic Acid, Venous: 0.7 mmol/L (ref 0.5–1.9)

## 2018-04-24 LAB — PROCALCITONIN: Procalcitonin: 0.38 ng/mL

## 2018-04-24 LAB — STREP PNEUMONIAE URINARY ANTIGEN: Strep Pneumo Urinary Antigen: NEGATIVE

## 2018-04-24 MED ORDER — VANCOMYCIN HCL IN DEXTROSE 1-5 GM/200ML-% IV SOLN
1000.0000 mg | Freq: Once | INTRAVENOUS | Status: AC
  Administered 2018-04-24: 1000 mg via INTRAVENOUS
  Filled 2018-04-24: qty 200

## 2018-04-24 MED ORDER — ACETAMINOPHEN 325 MG PO TABS
650.0000 mg | ORAL_TABLET | Freq: Four times a day (QID) | ORAL | Status: DC | PRN
  Administered 2018-04-24 – 2018-05-04 (×16): 650 mg via ORAL
  Filled 2018-04-24 (×19): qty 2

## 2018-04-24 MED ORDER — SODIUM CHLORIDE 0.9 % IV BOLUS (SEPSIS)
1000.0000 mL | Freq: Once | INTRAVENOUS | Status: AC
  Administered 2018-04-24: 1000 mL via INTRAVENOUS

## 2018-04-24 MED ORDER — FLUTICASONE PROPIONATE HFA 44 MCG/ACT IN AERO: 2.0000 | INHALATION_SPRAY | Freq: Two times a day (BID) | RESPIRATORY_TRACT | Status: DC

## 2018-04-24 MED ORDER — MONTELUKAST SODIUM 10 MG PO TABS
10.0000 mg | ORAL_TABLET | Freq: Every day | ORAL | Status: DC
  Administered 2018-04-25 – 2018-05-06 (×12): 10 mg via ORAL
  Filled 2018-04-24 (×12): qty 1

## 2018-04-24 MED ORDER — GUAIFENESIN-DM 100-10 MG/5ML PO SYRP
5.0000 mL | ORAL_SOLUTION | ORAL | Status: DC | PRN
  Administered 2018-04-24 – 2018-04-26 (×6): 5 mL via ORAL
  Filled 2018-04-24 (×6): qty 10

## 2018-04-24 MED ORDER — ENSURE ENLIVE PO LIQD
237.0000 mL | Freq: Two times a day (BID) | ORAL | Status: DC
  Administered 2018-04-25: 237 mL via ORAL

## 2018-04-24 MED ORDER — METOPROLOL SUCCINATE ER 50 MG PO TB24
50.0000 mg | ORAL_TABLET | Freq: Every evening | ORAL | Status: DC
  Administered 2018-04-24 – 2018-04-27 (×4): 50 mg via ORAL
  Filled 2018-04-24 (×4): qty 1

## 2018-04-24 MED ORDER — PANTOPRAZOLE SODIUM 40 MG PO TBEC
40.0000 mg | DELAYED_RELEASE_TABLET | Freq: Two times a day (BID) | ORAL | Status: DC
  Administered 2018-04-24 – 2018-05-06 (×24): 40 mg via ORAL
  Filled 2018-04-24 (×24): qty 1

## 2018-04-24 MED ORDER — SODIUM CHLORIDE 0.9% FLUSH
10.0000 mL | INTRAVENOUS | Status: DC | PRN
  Administered 2018-04-25 – 2018-05-06 (×2): 10 mL
  Filled 2018-04-24 (×2): qty 40

## 2018-04-24 MED ORDER — LACTATED RINGERS IV BOLUS
1000.0000 mL | Freq: Once | INTRAVENOUS | Status: AC
  Administered 2018-04-24: 1000 mL via INTRAVENOUS

## 2018-04-24 MED ORDER — ONDANSETRON HCL 4 MG/2ML IJ SOLN
4.0000 mg | Freq: Four times a day (QID) | INTRAMUSCULAR | Status: DC | PRN
  Administered 2018-04-30 – 2018-05-05 (×4): 4 mg via INTRAVENOUS
  Filled 2018-04-24 (×5): qty 2

## 2018-04-24 MED ORDER — SODIUM CHLORIDE 0.9 % IV SOLN
2.0000 g | Freq: Once | INTRAVENOUS | Status: AC
  Administered 2018-04-24: 2 g via INTRAVENOUS
  Filled 2018-04-24: qty 2

## 2018-04-24 MED ORDER — ALBUTEROL SULFATE (2.5 MG/3ML) 0.083% IN NEBU: 2.5000 mg | INHALATION_SOLUTION | Freq: Four times a day (QID) | RESPIRATORY_TRACT | Status: DC | PRN

## 2018-04-24 MED ORDER — SODIUM CHLORIDE 0.9 % IV BOLUS (SEPSIS)
250.0000 mL | Freq: Once | INTRAVENOUS | Status: AC
  Administered 2018-04-24: 250 mL via INTRAVENOUS

## 2018-04-24 MED ORDER — INSULIN ASPART 100 UNIT/ML ~~LOC~~ SOLN: 0.0000 [IU] | Freq: Every day | SUBCUTANEOUS | Status: DC

## 2018-04-24 MED ORDER — ENOXAPARIN SODIUM 40 MG/0.4ML ~~LOC~~ SOLN: 40.0000 mg | SUBCUTANEOUS | Status: DC

## 2018-04-24 MED ORDER — SODIUM CHLORIDE 0.9 % IV SOLN
INTRAVENOUS | Status: DC
  Administered 2018-04-24 – 2018-04-27 (×4): via INTRAVENOUS

## 2018-04-24 MED ORDER — INSULIN ASPART 100 UNIT/ML ~~LOC~~ SOLN
0.0000 [IU] | Freq: Three times a day (TID) | SUBCUTANEOUS | Status: DC
  Administered 2018-04-25: 3 [IU] via SUBCUTANEOUS
  Administered 2018-04-25: 2 [IU] via SUBCUTANEOUS
  Administered 2018-04-26 – 2018-04-27 (×2): 3 [IU] via SUBCUTANEOUS
  Administered 2018-04-28: 2 [IU] via SUBCUTANEOUS
  Administered 2018-04-28: 3 [IU] via SUBCUTANEOUS
  Administered 2018-04-29: 2 [IU] via SUBCUTANEOUS
  Administered 2018-04-29: 3 [IU] via SUBCUTANEOUS
  Administered 2018-04-29 – 2018-04-30 (×2): 5 [IU] via SUBCUTANEOUS
  Administered 2018-04-30: 10:00:00 3 [IU] via SUBCUTANEOUS
  Administered 2018-04-30: 13:00:00 2 [IU] via SUBCUTANEOUS
  Administered 2018-05-01: 5 [IU] via SUBCUTANEOUS
  Administered 2018-05-01 (×2): 3 [IU] via SUBCUTANEOUS
  Administered 2018-05-02: 5 [IU] via SUBCUTANEOUS
  Administered 2018-05-02: 3 [IU] via SUBCUTANEOUS
  Administered 2018-05-03: 09:00:00 5 [IU] via SUBCUTANEOUS
  Administered 2018-05-03: 18:00:00 3 [IU] via SUBCUTANEOUS
  Administered 2018-05-03: 5 [IU] via SUBCUTANEOUS
  Administered 2018-05-04: 3 [IU] via SUBCUTANEOUS
  Administered 2018-05-04 – 2018-05-05 (×3): 2 [IU] via SUBCUTANEOUS
  Administered 2018-05-05 – 2018-05-06 (×3): 3 [IU] via SUBCUTANEOUS

## 2018-04-24 MED ORDER — VANCOMYCIN HCL IN DEXTROSE 750-5 MG/150ML-% IV SOLN
750.0000 mg | Freq: Two times a day (BID) | INTRAVENOUS | Status: DC
  Administered 2018-04-24 – 2018-04-26 (×4): 750 mg via INTRAVENOUS
  Filled 2018-04-24 (×5): qty 150

## 2018-04-24 MED ORDER — ALUM & MAG HYDROXIDE-SIMETH 200-200-20 MG/5ML PO SUSP
30.0000 mL | ORAL | Status: DC | PRN
  Administered 2018-04-24: 30 mL via ORAL
  Filled 2018-04-24: qty 30

## 2018-04-24 MED ORDER — BUDESONIDE 0.25 MG/2ML IN SUSP
0.2500 mg | Freq: Two times a day (BID) | RESPIRATORY_TRACT | Status: DC
  Administered 2018-04-24 – 2018-04-25 (×2): 0.25 mg via RESPIRATORY_TRACT
  Filled 2018-04-24 (×2): qty 2

## 2018-04-24 MED ORDER — INSULIN DETEMIR 100 UNIT/ML ~~LOC~~ SOLN
20.0000 [IU] | Freq: Every day | SUBCUTANEOUS | Status: DC
  Administered 2018-04-24 – 2018-04-26 (×3): 20 [IU] via SUBCUTANEOUS
  Filled 2018-04-24 (×4): qty 0.2

## 2018-04-24 MED ORDER — SODIUM CHLORIDE 0.9 % IV SOLN
1.0000 g | Freq: Three times a day (TID) | INTRAVENOUS | Status: DC
  Administered 2018-04-24 – 2018-04-25 (×3): 1 g via INTRAVENOUS
  Filled 2018-04-24 (×4): qty 1

## 2018-04-24 NOTE — Progress Notes (Signed)
A consult was received from an ED physician for vancomycin and cefepime per pharmacy dosing.  The patient's profile has been reviewed for ht/wt/allergies/indication/available labs.    A one time order has been placed for vancomycin 1000 mg IV and cefepime 2gm IV x1.  Further antibiotics/pharmacy consults should be ordered by admitting physician if indicated.                       Thank you, Lynelle Doctor 04/24/2018  9:13 AM

## 2018-04-24 NOTE — ED Notes (Signed)
ED TO INPATIENT HANDOFF REPORT  ED Nurse Name and Phone #: Maddie [phone]  S Name/Age/Gender Stacy Shelton 58 y.o. female Room/Bed: WA06/WA06  Code Status   Code Status: Prior  Home/SNF/Other Home Patient oriented to: self, place, time and situation Is this baseline? Yes   Triage Complete: Triage complete  Chief Complaint cancer pt/ fever   Triage Note Pt recently admitted for PNA, and was D/C last Wednesday.  Pt woke up this am with a temperature of 101.3.  Pt hx of stage IV CA with mets.   Allergies Allergies  Allergen Reactions  . Nsaids Anaphylaxis    Level of Care/Admitting Diagnosis ED Disposition    ED Disposition Condition Comment   Admit  Hospital Area: [facility] [[id number]]  Level of Care: Telemetry [5]  Admit to tele based on following criteria: Other see comments  Comments: tachycardia  Diagnosis: Fever [[id number]]  Admitting Physician: Karmen Bongo [[id number]]  Attending Physician: Karmen Bongo [[id number]]  Estimated length of stay: past midnight tomorrow  Certification:: I certify this patient will need inpatient services for at least 2 midnights  PT Class (Do Not Modify): Inpatient [101]  PT Acc Code (Do Not Modify): Private [1]       B Medical/Surgery History Past Medical History:  Diagnosis Date  . Asthma   . Diabetes ([place])   . Metastatic breast cancer ([place])   . Seasonal allergies    Past Surgical History:  Procedure Laterality Date  . IR IMAGING GUIDED PORT INSERTION  03/24/2018  . KIDNEY STONE SURGERY    . SINOSCOPY    . WISDOM TOOTH EXTRACTION       A IV Location/Drains/Wounds Patient Lines/Drains/Airways Status   Active Line/Drains/Airways    Name:   Placement date:   Placement time:   Site:   Days:   Implanted Port 03/24/18 Right Chest   03/24/18    0912    Chest   31          Intake/Output Last 24 hours  Intake/Output Summary (Last 24 hours) at 04/24/2018 1337 Last data filed at 04/24/2018 1145 Gross per  24 hour  Intake 1300 ml  Output --  Net 1300 ml    Labs/Imaging Results for orders placed or performed during the hospital encounter of 04/24/18 (from the past 48 hour(s))  Lactic acid, plasma     Status: None   Collection Time: 04/24/18  9:30 AM  Result Value Ref Range   Lactic Acid, Venous 0.9 0.5 - 1.9 mmol/L    Comment: Performed at [facility], [place] [address] [place], [place] [address]  Comprehensive metabolic panel     Status: Abnormal   Collection Time: 04/24/18  9:30 AM  Result Value Ref Range   Sodium 133 (L) 135 - 145 mmol/L   Potassium 3.9 3.5 - 5.1 mmol/L   Chloride 97 (L) 98 - 111 mmol/L   CO2 27 22 - 32 mmol/L   Glucose, Bld 138 (H) 70 - 99 mg/dL   BUN 11 6 - 20 mg/dL   Creatinine, Ser 0.57 0.44 - 1.00 mg/dL   Calcium 8.8 (L) 8.9 - 10.3 mg/dL   Total Protein 7.3 6.5 - 8.1 g/dL   Albumin 2.9 (L) 3.5 - 5.0 g/dL   AST 33 15 - 41 U/L   ALT 34 0 - 44 U/L   Alkaline Phosphatase 247 (H) 38 - 126 U/L   Total Bilirubin 2.1 (H) 0.3 - 1.2 mg/dL   GFR calc  non Af Amer >60 >60 mL/min   GFR calc Af Amer >60 >60 mL/min   Anion gap 9 5 - 15    Comment: Performed at [facility], [place] [address], [place], [place] [address]  CBC WITH DIFFERENTIAL     Status: Abnormal   Collection Time: 04/24/18  9:30 AM  Result Value Ref Range   WBC 5.6 4.0 - 10.5 K/uL   RBC 2.83 (L) 3.87 - 5.11 MIL/uL   Hemoglobin 7.5 (L) 12.0 - 15.0 g/dL   HCT 25.1 (L) 36.0 - 46.0 %   MCV 88.7 80.0 - 100.0 fL   MCH 26.5 26.0 - 34.0 pg   MCHC 29.9 (L) 30.0 - 36.0 g/dL   RDW 18.9 (H) 11.5 - 15.5 %   Platelets 77 (L) 150 - 400 K/uL    Comment: Immature Platelet Fraction may be clinically indicated, consider ordering this additional test [id number] CONSISTENT WITH PREVIOUS RESULT    nRBC 0.5 (H) 0.0 - 0.2 %   Neutrophils Relative % 59 %   Neutro Abs 3.7 1.7 - 7.7 K/uL   Band Neutrophils 7 %   Lymphocytes Relative 20 %   Lymphs Abs 1.1 0.7 - 4.0 K/uL   Monocytes  Relative 10 %   Monocytes Absolute 0.6 0.1 - 1.0 K/uL   Eosinophils Relative 1 %   Eosinophils Absolute 0.1 0.0 - 0.5 K/uL   Basophils Relative 0 %   Basophils Absolute 0.0 0.0 - 0.1 K/uL   WBC Morphology MILD LEFT SHIFT (1-5% METAS, OCC MYELO, OCC BANDS)    Other 1 %   Myelocytes 2 %   Abs Immature Granulocytes 0.10 (H) 0.00 - 0.07 K/uL   Polychromasia PRESENT     Comment: Performed at [facility], [place] [address], [place], [place] [address]  Urinalysis, Routine w reflex microscopic     Status: Abnormal   Collection Time: 04/24/18  9:30 AM  Result Value Ref Range   Color, Urine YELLOW YELLOW   APPearance CLEAR CLEAR   Specific Gravity, Urine 1.016 1.005 - 1.030   pH 5.0 5.0 - 8.0   Glucose, UA NEGATIVE NEGATIVE mg/dL   Hgb urine dipstick NEGATIVE NEGATIVE   Bilirubin Urine NEGATIVE NEGATIVE   Ketones, ur 5 (A) NEGATIVE mg/dL   Protein, ur NEGATIVE NEGATIVE mg/dL   Nitrite NEGATIVE NEGATIVE   Leukocytes,Ua NEGATIVE NEGATIVE    Comment: Performed at [place] [address], [place], [place] [address]   Dg Chest Port 1 View  Result Date: 04/24/2018 CLINICAL DATA:  Fever.  Metastatic breast carcinoma EXAM: PORTABLE CHEST 1 VIEW COMPARISON:  April 12, 2018 FINDINGS: There has been significant clearing of consolidation from the right upper lobe. A small amount of patchy opacity with volume loss remains in this area. Atelectasis remains in the left base. No new opacity evident. Heart is upper normal in size with pulmonary vascularity normal. No adenopathy. There is aortic atherosclerosis. Port-A-Cath tip is in the superior vena cava. No pneumothorax. No bone lesions are appreciable. IMPRESSION: Significant partial clearing of consolidation from the right upper lobe. Patchy infiltrate and volume loss remains in this area. Stable left base atelectasis. No new opacity. Stable cardiac silhouette. Stable Port-A-Cath positioning. Aortic Atherosclerosis  (ICD10-I70.0). Electronically Signed   By: Lowella Grip III M.D.   On: 04/24/2018 09:32    Pending Labs Unresulted Labs (From admission, onward)    Start     Ordered   04/24/18 0853  Blood Culture (  routine x 2)  BLOOD CULTURE X 2,   STAT     04/24/18 0855          Vitals/Pain Today's Vitals   04/24/18 1140 04/24/18 1200 04/24/18 1215 04/24/18 1230  BP: (!) 116/58 (!) 114/55  (!) 115/50  Pulse: (!) 107 (!) 105 (!) 105 (!) 105  Resp: 19 (!) 28 (!) 32 (!) 24  Temp:      TempSrc:      SpO2: 94% 92% 91% 95%    Isolation Precautions No active isolations  Medications Medications  lactated ringers bolus 1,000 mL (0 mLs Intravenous Stopped 04/24/18 1145)  vancomycin (VANCOCIN) IVPB 1000 mg/200 mL premix (0 mg Intravenous Stopped 04/24/18 1129)  ceFEPIme (MAXIPIME) 2 g in sodium chloride 0.9 % 100 mL IVPB (0 g Intravenous Stopped 04/24/18 1024)    Mobility walks with person assist Low fall risk   Focused Assessments N/a  R Recommendations: See Admitting Provider Note  Report given to:   Additional Notes:

## 2018-04-24 NOTE — H&P (Addendum)
History and Physical    Stacy Shelton IRS:[id number] DOB: Mar 14, 1960 DOA: 04/24/2018  PCP: Robyne Peers, MD Consultants:  Alvy Bimler - oncology; Lisbeth Renshaw - rad onc Patient coming from:  Home - lives with 58yo mother, sister; Stacy Shelton: Tora Duck, [phone]  Chief Complaint: Fever  HPI: Stacy Shelton is a 57 y.o. female with medical history significant of stage IV breast cancer; DM; and diastolic CHF presenting with fever.  She felt well at the time of d/c on 3/18.  She began to feel bad again 2 days ago - she noticed a terrible cough, SOB.  Last night, her temperature spiked to 102.  Denies sick contacts for COVID, does not leave the house.  Cough is productive of what "feels like" clear phlegm.  Symptoms are similar to prior hospitalization.  She has had a chronic cough since October but has been the same since she has had the PNA.  She was hospitalized 3/9-18 for sepsis with shock from R lung multilobar PNA and well as possible early multifocal PNA of the left lung.  She was treated with Vanc/Meropenem -> Rocephin.    ED Course:  Bounceback with fever.  Prior source PNA vs. UTI.  Increased cough with sputum, fever to 101.3.  Upper lobe is improved on imaging but left-sided PNA appears better.  Normal lactate.  Normal UA.  She has not been out of the house since return from the hospital.  Not being considered for COVID at this time.  Review of Systems: As per HPI; otherwise review of systems reviewed and negative.   Ambulatory Status:  Ambulates without assistance  Past Medical History:  Diagnosis Date   Asthma    Diabetes ([place])    Metastatic breast cancer ([place])    Seasonal allergies     Past Surgical History:  Procedure Laterality Date   IR IMAGING GUIDED PORT INSERTION  03/24/2018   KIDNEY STONE SURGERY     SINOSCOPY     WISDOM TOOTH EXTRACTION      Social History   Socioeconomic History   Marital status: Single    Spouse name: Not on file   Number of children: Not on file     Years of education: Not on file   Highest education level: Not on file  Occupational History   Occupation: Engineer, site strain: Not on file   Food insecurity:    Worry: Not on file    Inability: Not on file   Transportation needs:    Medical: Not on file    Non-medical: Not on file  Tobacco Use   Smoking status: Never Smoker   Smokeless tobacco: Never Used  Substance and Sexual Activity   Alcohol use: Yes    Alcohol/week: 0.0 standard drinks    Comment: socially   Drug use: No   Sexual activity: Not on file  Lifestyle   Physical activity:    Days per week: Not on file    Minutes per session: Not on file   Stress: Not on file  Relationships   Social connections:    Talks on phone: Not on file    Gets together: Not on file    Attends religious service: Not on file    Active member of club or organization: Not on file    Attends meetings of clubs or organizations: Not on file    Relationship status: Not on file   Intimate partner violence:    Fear of current or  ex partner: Not on file    Emotionally abused: Not on file    Physically abused: Not on file    Forced sexual activity: Not on file  Other Topics Concern   Not on file  Social History Narrative   Not on file    Allergies  Allergen Reactions   Nsaids Anaphylaxis    Family History  Problem Relation Age of Onset   Allergic rhinitis Neg Hx    Angioedema Neg Hx    Asthma Neg Hx    Atopy Neg Hx    Eczema Neg Hx    Immunodeficiency Neg Hx    Urticaria Neg Hx     Prior to Admission medications   Medication Sig Start Date End Date Taking? Authorizing Provider  albuterol (PROAIR HFA) 108 (90 Base) MCG/ACT inhaler Inhale 2 puffs into the lungs every 6 (six) hours as needed for wheezing or shortness of breath. 05/16/17  Yes Padgett, Rae Halsted, MD  dexamethasone (DECADRON) 4 MG tablet Take 1 tablet in the morning before chemo, none on the  day of chemo and daily in the morning after chemo for 2 days, with food 03/21/18  Yes Alvy Bimler, Ni, MD  esomeprazole (NEXIUM) 40 MG capsule Take 1 capsule (40 mg total) by mouth 2 (two) times daily before a meal. 05/16/17  Yes Padgett, Rae Halsted, MD  fluticasone (FLOVENT HFA) 44 MCG/ACT inhaler Inhale 2 puffs into the lungs 2 (two) times daily. 05/16/17  Yes Padgett, Rae Halsted, MD  insulin aspart (NOVOLOG) 100 UNIT/ML FlexPen Inject 8 Units into the skin 3 (three) times daily with meals. 03/13/18  Yes Donne Hazel, MD  Insulin Detemir (LEVEMIR) 100 UNIT/ML Pen Inject 20 Units into the skin daily. 03/13/18  Yes Donne Hazel, MD  lidocaine-prilocaine (EMLA) cream Apply to affected area once Patient taking differently: Apply 1 application topically as needed (port access). Apply to affected area once 03/21/18  Yes Gorsuch, Ni, MD  lip balm (CARMEX) ointment Apply topically as needed for lip care. 04/16/18  Yes Purohit, Konrad Dolores, MD  loperamide (IMODIUM A-D) 2 MG tablet Take 1 tablet (2 mg total) by mouth 4 (four) times daily as needed for diarrhea or loose stools. 04/16/18  Yes Purohit, Konrad Dolores, MD  LORazepam (ATIVAN) 0.5 MG tablet Take 1 tab po 30 minutes prior to radiation or MRI 03/14/18  Yes Hayden Pedro, PA-C  metFORMIN (GLUCOPHAGE) 500 MG tablet Take 1 tablet (500 mg total) by mouth 2 (two) times daily with a meal. Patient taking differently: Take 500 mg by mouth daily.  03/17/18  Yes Gorsuch, Ni, MD  metoprolol succinate (TOPROL-XL) 50 MG 24 hr tablet Take 50 mg by mouth every evening. 03/10/18  Yes [provider]  montelukast (SINGULAIR) 10 MG tablet TAKE 1 TABLET BY MOUTH EVERYDAY AT BEDTIME Patient taking differently: Take 10 mg by mouth daily.  08/12/17  Yes Kennith Gain, MD  ACCU-CHEK AVIVA PLUS test strip  03/13/18   [provider]  ACCU-CHEK SOFTCLIX LANCETS lancets  03/13/18   [provider]  blood glucose meter kit and supplies  KIT Dispense based on patient and insurance preference. Use up to four times daily as directed. (FOR ICD-9 250.00, 250.01). 03/13/18   Donne Hazel, MD  Insulin Pen Needle 31G X 5 MM MISC 1 Device by Does not apply route QID. For use with insulin pens 03/13/18   Donne Hazel, MD  ranitidine (ZANTAC) 300 MG tablet Take 1 tablet (300 mg  total) by mouth at bedtime. Patient not taking: Reported on 04/24/2018 05/16/17   Kennith Gain, MD    Physical Exam: Vitals:   04/24/18 1215 04/24/18 1230 04/24/18 1300 04/24/18 1330  BP:  (!) 115/50 (!) 115/56 (!) 115/55  Pulse: (!) 105 (!) 105 (!) 104 (!) 107  Resp: (!) 32 (!) 24 (!) 27 (!) 21  Temp:      TempSrc:      SpO2: 91% 95% 95% 94%      General:  Appears calm but ill and is NAD; surgical face mask is in place  Eyes:  PERRL, EOMI, normal lids, iris  ENT:  grossly normal hearing, lips & tongue, mmm; appropriate dentition  Neck:  no LAD, masses or thyromegaly  Cardiovascular:  RR with tachycardia, no m/r/g. No LE edema.   Respiratory: LLL rhonchi, otherwise good air movement.  Mildly increased respiratory effort on [place] O2.  Abdomen:  soft, NT, ND, NABS  Back:   normal alignment, no CVAT  Skin:  no rash or induration seen on limited exam  Musculoskeletal:  grossly normal tone BUE/BLE, good ROM, no bony abnormality  Psychiatric:  blunted mood and affect, speech fluent and appropriate, AOx3  Neurologic:  CN 2-12 grossly intact, moves all extremities in coordinated fashion, sensation intact    Radiological Exams on Admission: Dg Chest Port 1 View  Result Date: 04/24/2018 CLINICAL DATA:  Fever.  Metastatic breast carcinoma EXAM: PORTABLE CHEST 1 VIEW COMPARISON:  April 12, 2018 FINDINGS: There has been significant clearing of consolidation from the right upper lobe. A small amount of patchy opacity with volume loss remains in this area. Atelectasis remains in the left base. No new opacity evident. Heart is upper normal in  size with pulmonary vascularity normal. No adenopathy. There is aortic atherosclerosis. Port-A-Cath tip is in the superior vena cava. No pneumothorax. No bone lesions are appreciable. IMPRESSION: Significant partial clearing of consolidation from the right upper lobe. Patchy infiltrate and volume loss remains in this area. Stable left base atelectasis. No new opacity. Stable cardiac silhouette. Stable Port-A-Cath positioning. Aortic Atherosclerosis (ICD10-I70.0). Electronically Signed   By: Lowella Grip III M.D.   On: 04/24/2018 09:32    EKG: Independently reviewed.  Sinus tachycardia with rate 109; nonspecific ST changes with no evidence of acute ischemia   Labs on Admission: I have personally reviewed the available labs and imaging studies at the time of the admission.  Pertinent labs:   Na++ 133 Glucose 138 AP 247 Albumin 2.9 Bili 2.1 Lactate 0.9 WBC 5.6 Hgb 7.5; 8.9 on 3/18 but 7.8 on 3/16 Platelets 77; 41 on 3/18 UA: 5 ketones  Assessment/Plan Principal Problem:   Fever Active Problems:   Uncontrolled diabetes mellitus with hyperglycemia (HCC)   Metastatic breast cancer (HCC)   Acute respiratory failure with hypoxia (HCC)   Left lower lobe pneumonia (HCC)   Chronic diastolic CHF (congestive heart failure) (HCC)   Anemia associated with chemotherapy   Chemotherapy-induced thrombocytopenia   Fever, likely associated with LLL PNA, with sepsis physiology -Given recurrence of productive cough, fever to 102, mildly decreased oxygen saturation, and abnormal breath sounds in LLL in conjunction with abnormal findings on chest x-ray following recent hospitalization, suspect healthcare-associated pneumonia.  -Low suspicion for COVID 19 at this time. -CURB-65 score is 2 - will admit the patient -Pneumonia Severity Index (PSI) is Class 2, 1% mortality. -The patient has the following criteria for MDR (multi-drug resistance): IV antibiotics in the last 90 days; >2 days of  inpatient  treatment within the last 90 days -The patient will need treatment for HCAP due to MDR risk factors as above; will treat with Cefepime and Vancomycin. -Additional complicating factors include: immunocompromise. -NS @ 75cc/hr -Fever control -Repeat CBC in am -Sputum cultures -Blood cultures -Strep pneumo testing -Will order lower respiratory tract procalcitonin level.  Antibiotics would not be indicated for PCT <0.1 and probably should not be used for < 0.25.  >0.5 indicates infection and >>0.5 indicates more serious disease.  As the procalcitonin level normalizes, it will be reasonable to consider de-escalation of antibiotic coverage. -SIRS criteria in this patient includes:fever, tachycardia, tacyhpnea -Patient has evidence of acute organ failure with borderline hypotension -While awaiting blood cultures, this appears to be a preseptic condition. -Sepsis protocol initiated -Patient had initial lactate < 4 and normal BP but her BP is now SBP <90/MAP <65 and so has received the 30 cc/kg IVF bolus. 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  No Lovenox. -Dr. Alvy Bimler has been notified of admission and added to the treatment team   DM -Recent A1c 12.9 -Continue Levemir -Hold Glucophage -Will cover with moderate-scale SSI including qhs coverage  Chronic diastolic CHF -6/64 echo with unspecified impaired relaxation but preserved EF -She appears to be compensated at this time -Will monitor, particularly in light of sepsis bolus she is receiving  DVT prophylaxis:  SCDs Code Status:  Full - confirmed with patient Family Communication: None present  Disposition Plan:  Home once clinically improved Consults called: Oncology Admission status: Admit - It is my clinical opinion that admission to INPATIENT is reasonable and necessary because of the expectation that this patient will require hospital care that crosses at least 2 midnights to treat this condition based on the medical complexity of the problems presented.  Given the aforementioned information, the predictability of an adverse outcome is felt to be significant.   Karmen Bongo MD [facility]   How to contact the [facility] Attending or Consulting provider [place] or covering provider during after hours [place], for this patient?  1. Check the care team in [facility] and look for a) attending/consulting TRH provider listed and b) the [facility] team listed 2. Log into [email or web address] and use [place]'s universal password to access. If you do not have the password, please contact the hospital operator. 3. Locate the [facility] provider you are looking for under [facility] and page to a number that you can be directly reached. 4. If you still have difficulty reaching the provider, please page the [facility] (Director on Call) for the Hospitalists listed on amion for assistance.   04/24/2018, 3:11 PM

## 2018-04-24 NOTE — ED Notes (Signed)
ED TO INPATIENT HANDOFF REPORT  ED Nurse Name and Phone #: Maddie RN  S Name/Age/Gender Stacy Shelton 58 y.o. female Room/Bed: WA06/WA06  Code Status   Code Status: Prior  Home/SNF/Other Home {Patient oriented to: A&Ox4 Is this baseline? Yes   Triage Complete: Triage complete  Chief Complaint cancer pt  fever   Triage Note Pt recently admitted for PNA, and was D/C last Wednesday.  Pt woke up this am with a temperature of 101.3.  Pt hx of stage IV CA with mets.   Allergies Allergies  Allergen Reactions  . Nsaids Anaphylaxis    Level of Care/Admitting Diagnosis ED Disposition    ED Disposition Condition Comment   Admit  Hospital Area: [place] [[id number]]  Level of Care: Telemetry [5]  Admit to tele based on following criteria: Other see comments  Comments: tachycardia  Diagnosis: Fever [[id number]]  Admitting Physician: Karmen Bongo [[id number]]  Attending Physician: Karmen Bongo [[id number]]  Estimated length of stay: past midnight tomorrow  Certification:: I certify this patient will need inpatient services for at least 2 midnights  PT Class (Do Not Modify): Inpatient [101]  PT Acc Code (Do Not Modify): Private [1]       B Medical/Surgery History Past Medical History:  Diagnosis Date  . Asthma   . Diabetes ([place])   . Metastatic breast cancer ([place])   . Seasonal allergies    Past Surgical History:  Procedure Laterality Date  . IR IMAGING GUIDED PORT INSERTION  03/24/2018  . KIDNEY STONE SURGERY    . SINOSCOPY    . WISDOM TOOTH EXTRACTION       A IV Location/Drains/Wounds Patient Lines/Drains/Airways Status   Active Line/Drains/Airways    Name:   Placement date:   Placement time:   Site:   Days:   Implanted Port 03/24/18 Right Chest   03/24/18    0912    Chest   31          Intake/Output Last 24 hours  Intake/Output Summary (Last 24 hours) at 04/24/2018 1440 Last data filed at 04/24/2018 1145 Gross per 24 hour  Intake 1300 ml   Output -  Net 1300 ml    Labs/Imaging Results for orders placed or performed during the hospital encounter of 04/24/18 (from the past 48 hour(s))  Lactic acid, plasma     Status: None   Collection Time: 04/24/18  9:30 AM  Result Value Ref Range   Lactic Acid, Venous 0.9 0.5 - 1.9 mmol/L    Comment: Performed at [facility], [place] [address], [place], [place] [address]  Comprehensive metabolic panel     Status: Abnormal   Collection Time: 04/24/18  9:30 AM  Result Value Ref Range   Sodium 133 (L) 135 - 145 mmol/L   Potassium 3.9 3.5 - 5.1 mmol/L   Chloride 97 (L) 98 - 111 mmol/L   CO2 27 22 - 32 mmol/L   Glucose, Bld 138 (H) 70 - 99 mg/dL   BUN 11 6 - 20 mg/dL   Creatinine, Ser 0.57 0.44 - 1.00 mg/dL   Calcium 8.8 (L) 8.9 - 10.3 mg/dL   Total Protein 7.3 6.5 - 8.1 g/dL   Albumin 2.9 (L) 3.5 - 5.0 g/dL   AST 33 15 - 41 U/L   ALT 34 0 - 44 U/L   Alkaline Phosphatase 247 (H) 38 - 126 U/L   Total Bilirubin 2.1 (H) 0.3 - 1.2 mg/dL   GFR calc non Af Amer >  60 >60 mL/min   GFR calc Af Amer >60 >60 mL/min   Anion gap 9 5 - 15    Comment: Performed at [facility], [place] [address], [place], [place] [address]  CBC WITH DIFFERENTIAL     Status: Abnormal   Collection Time: 04/24/18  9:30 AM  Result Value Ref Range   WBC 5.6 4.0 - 10.5 K/uL   RBC 2.83 (L) 3.87 - 5.11 MIL/uL   Hemoglobin 7.5 (L) 12.0 - 15.0 g/dL   HCT 25.1 (L) 36.0 - 46.0 %   MCV 88.7 80.0 - 100.0 fL   MCH 26.5 26.0 - 34.0 pg   MCHC 29.9 (L) 30.0 - 36.0 g/dL   RDW 18.9 (H) 11.5 - 15.5 %   Platelets 77 (L) 150 - 400 K/uL    Comment: Immature Platelet Fraction may be clinically indicated, consider ordering this additional test [id number] CONSISTENT WITH PREVIOUS RESULT    nRBC 0.5 (H) 0.0 - 0.2 %   Neutrophils Relative % 59 %   Neutro Abs 3.7 1.7 - 7.7 K/uL   Band Neutrophils 7 %   Lymphocytes Relative 20 %   Lymphs Abs 1.1 0.7 - 4.0 K/uL   Monocytes Relative 10 %   Monocytes  Absolute 0.6 0.1 - 1.0 K/uL   Eosinophils Relative 1 %   Eosinophils Absolute 0.1 0.0 - 0.5 K/uL   Basophils Relative 0 %   Basophils Absolute 0.0 0.0 - 0.1 K/uL   WBC Morphology MILD LEFT SHIFT (1-5% METAS, OCC MYELO, OCC BANDS)    Other 1 %   Myelocytes 2 %   Abs Immature Granulocytes 0.10 (H) 0.00 - 0.07 K/uL   Polychromasia PRESENT     Comment: Performed at [facility], [place] [address], [place], [place] [address]  Urinalysis, Routine w reflex microscopic     Status: Abnormal   Collection Time: 04/24/18  9:30 AM  Result Value Ref Range   Color, Urine YELLOW YELLOW   APPearance CLEAR CLEAR   Specific Gravity, Urine 1.016 1.005 - 1.030   pH 5.0 5.0 - 8.0   Glucose, UA NEGATIVE NEGATIVE mg/dL   Hgb urine dipstick NEGATIVE NEGATIVE   Bilirubin Urine NEGATIVE NEGATIVE   Ketones, ur 5 (A) NEGATIVE mg/dL   Protein, ur NEGATIVE NEGATIVE mg/dL   Nitrite NEGATIVE NEGATIVE   Leukocytes,Ua NEGATIVE NEGATIVE    Comment: Performed at [facility] [address], [place],  [address]   Dg Chest Port 1 View  Result Date: 04/24/2018 CLINICAL DATA:  Fever.  Metastatic breast carcinoma EXAM: PORTABLE CHEST 1 VIEW COMPARISON:  April 12, 2018 FINDINGS: There has been significant clearing of consolidation from the right upper lobe. A small amount of patchy opacity with volume loss remains in this area. Atelectasis remains in the left base. No new opacity evident. Heart is upper normal in size with pulmonary vascularity normal. No adenopathy. There is aortic atherosclerosis. Port-A-Cath tip is in the superior vena cava. No pneumothorax. No bone lesions are appreciable. IMPRESSION: Significant partial clearing of consolidation from the right upper lobe. Patchy infiltrate and volume loss remains in this area. Stable left base atelectasis. No new opacity. Stable cardiac silhouette. Stable Port-A-Cath positioning. Aortic Atherosclerosis (ICD10-I70.0). Electronically  Signed   By: Lowella Grip III M.D.   On: 04/24/2018 09:32    Pending Labs Unresulted Labs (From admission, onward)    Start     Ordered   04/24/18 0853  Blood Culture (routine x 2)  BLOOD CULTURE X 2,   STAT     04/24/18 0855          Vitals/Pain Today's Vitals   04/24/18 1215 04/24/18 1230 04/24/18 1300 04/24/18 1330  BP:  (!) 115/50 (!) 115/56 (!) 115/55  Pulse: (!) 105 (!) 105 (!) 104 (!) 107  Resp: (!) 32 (!) 24 (!) 27 (!) 21  Temp:      TempSrc:      SpO2: 91% 95% 95% 94%    Isolation Precautions No active isolations  Medications Medications  lactated ringers bolus 1,000 mL (0 mLs Intravenous Stopped 04/24/18 1145)  vancomycin (VANCOCIN) IVPB 1000 mg/200 mL premix (0 mg Intravenous Stopped 04/24/18 1129)  ceFEPIme (MAXIPIME) 2 g in sodium chloride 0.9 % 100 mL IVPB (0 g Intravenous Stopped 04/24/18 1024)    Mobility walks Low fall risk   Focused Assessments Pulmonary Assessment Handoff:  Lung sounds:   O2 Device: Room Air        R Recommendations: See Admitting Provider Note  Report given to: Apolonio Schneiders RN  Additional Notes: N/A

## 2018-04-24 NOTE — ED Provider Notes (Signed)
[facility] DEPT Provider Note   CSN: [id number] Arrival date & time: 04/24/18  0825    History   Chief Complaint No chief complaint on file.   HPI Stacy Shelton is a 58 y.o. female.     HPI Patient presents with fever and cough.  Fever up to 101.3 this morning.  Recent admission to the hospital and was discharged 8 days ago with bilateral pneumonia and UTI.  Has metastatic breast cancer that is stage IV but last chemo was over a month ago.  States she has been coughing.  Mild sputum production but not really change.  No dysuria or bladder fullness.  Has had decreased appetite but still states she has been able to eat and drink.  Had been on antibiotics from the hospital but was not discharged with any.  Patient has had no known exposures since she left the hospital Past Medical History:  Diagnosis Date  . Cancer [facility])     Patient Active Problem List   Diagnosis Date Noted  . Acute cystitis without hematuria   . Pneumonia of both lungs due to infectious organism   . Malnutrition of moderate degree 04/09/2018  . Acute respiratory failure with hypoxia ([place])   . Septic shock ([place])   . Sepsis ([place]) 04/07/2018  . Hypokalemia 04/07/2018  . Hypomagnesemia 04/07/2018  . Acute lower UTI 04/07/2018  . Hypophosphatemia 04/07/2018  . Diarrhea 03/21/2018  . Metastatic breast cancer ([place]) 03/17/2018  . Metastasis to brain ([place]) 03/17/2018  . Metastasis to liver ([place]) 03/17/2018  . Metastasis to bone ([place]) 03/17/2018  . Pancytopenia, acquired ([place]) 03/17/2018  . Goals of care, counseling/discussion 03/17/2018  . Encounter for antineoplastic chemotherapy 03/17/2018  . Absolute anemia 03/11/2018  . Thrombocytopenia ([place]) 03/11/2018  . Uncontrolled diabetes mellitus with hyperglycemia ([place]) 03/11/2018  . Moderate persistent asthma 03/11/2018    Past Surgical History:  Procedure Laterality Date  . IR IMAGING GUIDED PORT INSERTION  03/24/2018  . KIDNEY  STONE SURGERY    . SINOSCOPY    . WISDOM TOOTH EXTRACTION       OB History   No obstetric history on file.      Home Medications    Prior to Admission medications   Medication Sig Start Date End Date Taking? Authorizing Provider  albuterol (PROAIR HFA) 108 (90 Base) MCG/ACT inhaler Inhale 2 puffs into the lungs every 6 (six) hours as needed for wheezing or shortness of breath. 05/16/17  Yes Padgett, Rae Halsted, MD  dexamethasone (DECADRON) 4 MG tablet Take 1 tablet in the morning before chemo, none on the day of chemo and daily in the morning after chemo for 2 days, with food 03/21/18  Yes Alvy Bimler, Ni, MD  esomeprazole (NEXIUM) 40 MG capsule Take 1 capsule (40 mg total) by mouth 2 (two) times daily before a meal. 05/16/17  Yes Padgett, Rae Halsted, MD  fluticasone (FLOVENT HFA) 44 MCG/ACT inhaler Inhale 2 puffs into the lungs 2 (two) times daily. 05/16/17  Yes Padgett, Rae Halsted, MD  insulin aspart (NOVOLOG) 100 UNIT/ML FlexPen Inject 8 Units into the skin 3 (three) times daily with meals. 03/13/18  Yes Donne Hazel, MD  Insulin Detemir (LEVEMIR) 100 UNIT/ML Pen Inject 20 Units into the skin daily. 03/13/18  Yes Donne Hazel, MD  lidocaine-prilocaine (EMLA) cream Apply to affected area once Patient taking differently: Apply 1 application topically as needed (port access). Apply to affected area once 03/21/18  Yes [name], MD  lip  balm (CARMEX) ointment Apply topically as needed for lip care. 04/16/18  Yes Purohit, Konrad Dolores, MD  loperamide (IMODIUM A-D) 2 MG tablet Take 1 tablet (2 mg total) by mouth 4 (four) times daily as needed for diarrhea or loose stools. 04/16/18  Yes Purohit, Konrad Dolores, MD  LORazepam (ATIVAN) 0.5 MG tablet Take 1 tab po 30 minutes prior to radiation or MRI 03/14/18  Yes Hayden Pedro, PA-C  metFORMIN (GLUCOPHAGE) 500 MG tablet Take 1 tablet (500 mg total) by mouth 2 (two) times daily with a meal. Patient taking differently: Take 500 mg by  mouth daily.  03/17/18  Yes Gorsuch, Ni, MD  metoprolol succinate (TOPROL-XL) 50 MG 24 hr tablet Take 50 mg by mouth every evening. 03/10/18  Yes [provider]  montelukast (SINGULAIR) 10 MG tablet TAKE 1 TABLET BY MOUTH EVERYDAY AT BEDTIME Patient taking differently: Take 10 mg by mouth daily.  08/12/17  Yes Kennith Gain, MD  ACCU-CHEK AVIVA PLUS test strip  03/13/18   [provider]  ACCU-CHEK SOFTCLIX LANCETS lancets  03/13/18   [provider]  blood glucose meter kit and supplies KIT Dispense based on patient and insurance preference. Use up to four times daily as directed. (FOR ICD-9 250.00, 250.01). 03/13/18   Donne Hazel, MD  Insulin Pen Needle 31G X 5 MM MISC 1 Device by Does not apply route QID. For use with insulin pens 03/13/18   Donne Hazel, MD  ranitidine (ZANTAC) 300 MG tablet Take 1 tablet (300 mg total) by mouth at bedtime. Patient not taking: Reported on 04/24/2018 05/16/17   Kennith Gain, MD    Family History Family History  Problem Relation Age of Onset  . Allergic rhinitis Neg Hx   . Angioedema Neg Hx   . Asthma Neg Hx   . Atopy Neg Hx   . Eczema Neg Hx   . Immunodeficiency Neg Hx   . Urticaria Neg Hx     Social History Social History   Tobacco Use  . Smoking status: Never Smoker  . Smokeless tobacco: Never Used  Substance Use Topics  . Alcohol use: Yes    Alcohol/week: 0.0 standard drinks  . Drug use: No     Allergies   Nsaids   Review of Systems Review of Systems  Constitutional: Positive for appetite change, chills and fever.  HENT: Negative for congestion.   Respiratory: Positive for cough and shortness of breath.   Gastrointestinal: Positive for nausea. Negative for abdominal pain.  Genitourinary: Negative for dysuria.  Musculoskeletal: Negative for back pain.  Skin: Negative for rash.  Neurological: Positive for weakness.     Physical Exam Updated Vital Signs BP (!) 115/50    Pulse (!) 105   Temp 99 F (37.2 C) (Oral)   Resp (!) 24   LMP 10/30/2010   SpO2 95%   Physical Exam Vitals signs and nursing note reviewed.  Constitutional:      Comments: Patient is wearing a mask.  HENT:     Head: Normocephalic.  Eyes:     Pupils: Pupils are equal, round, and reactive to light.  Cardiovascular:     Comments: Mild tachycardia Pulmonary:     Comments: Harsh breath sounds, particularly in left base. Abdominal:     Tenderness: There is no abdominal tenderness.  Musculoskeletal:     Right lower leg: No edema.     Left lower leg: No edema.  Skin:    General: Skin is warm.  Capillary Refill: Capillary refill takes less than 2 seconds.  Neurological:     Mental Status: Mental status is at baseline.      ED Treatments / Results  Labs (all labs ordered are listed, but only abnormal results are displayed) Labs Reviewed  COMPREHENSIVE METABOLIC PANEL - Abnormal; Notable for the following components:      Result Value   Sodium 133 (*)    Chloride 97 (*)    Glucose, Bld 138 (*)    Calcium 8.8 (*)    Albumin 2.9 (*)    Alkaline Phosphatase 247 (*)    Total Bilirubin 2.1 (*)    All other components within normal limits  CBC WITH DIFFERENTIAL/PLATELET - Abnormal; Notable for the following components:   RBC 2.83 (*)    Hemoglobin 7.5 (*)    HCT 25.1 (*)    MCHC 29.9 (*)    RDW 18.9 (*)    Platelets 77 (*)    nRBC 0.5 (*)    Abs Immature Granulocytes 0.10 (*)    All other components within normal limits  URINALYSIS, ROUTINE W REFLEX MICROSCOPIC - Abnormal; Notable for the following components:   Ketones, ur 5 (*)    All other components within normal limits  CULTURE, BLOOD (ROUTINE X 2)  CULTURE, BLOOD (ROUTINE X 2)  LACTIC ACID, PLASMA    EKG EKG Interpretation  Date/Time:  Thursday April 24 2018 09:54:19 EDT Ventricular Rate:  109 PR Interval:    QRS Duration: 76 QT Interval:  322 QTC Calculation: 434 R Axis:   17 Text Interpretation:   Sinus tachycardia Low voltage, precordial leads Borderline repolarization abnormality Confirmed by Davonna Belling [phone]) on 04/24/2018 12:47:16 PM   Radiology Dg Chest Port 1 View  Result Date: 04/24/2018 CLINICAL DATA:  Fever.  Metastatic breast carcinoma EXAM: PORTABLE CHEST 1 VIEW COMPARISON:  April 12, 2018 FINDINGS: There has been significant clearing of consolidation from the right upper lobe. A small amount of patchy opacity with volume loss remains in this area. Atelectasis remains in the left base. No new opacity evident. Heart is upper normal in size with pulmonary vascularity normal. No adenopathy. There is aortic atherosclerosis. Port-A-Cath tip is in the superior vena cava. No pneumothorax. No bone lesions are appreciable. IMPRESSION: Significant partial clearing of consolidation from the right upper lobe. Patchy infiltrate and volume loss remains in this area. Stable left base atelectasis. No new opacity. Stable cardiac silhouette. Stable Port-A-Cath positioning. Aortic Atherosclerosis (ICD10-I70.0). Electronically Signed   By: Lowella Grip III M.D.   On: 04/24/2018 09:32    Procedures Procedures (including critical care time)  Medications Ordered in ED Medications  lactated ringers bolus 1,000 mL (0 mLs Intravenous Stopped 04/24/18 1145)  vancomycin (VANCOCIN) IVPB 1000 mg/200 mL premix (0 mg Intravenous Stopped 04/24/18 1129)  ceFEPIme (MAXIPIME) 2 g in sodium chloride 0.9 % 100 mL IVPB (0 g Intravenous Stopped 04/24/18 1024)     Initial Impression / Assessment and Plan / ED Course  I have reviewed the triage vital signs and the nursing notes.  Pertinent labs & imaging results that were available during my care of the patient were reviewed by me and considered in my medical decision making (see chart for details).        Patient with recent pneumonia admission.  Discharged around a week ago.  Today had return of fevers.  Has metastatic lung cancer.  X-ray improved  but has had more cough.  White count is improved.  However with  recurrent cough and fevers I feel patient would benefit from admission back into the hospital for further evaluation of this.  Had sepsis with last admission although does not appear septic this time.  Still has had a mild tachycardia but normal lactic acid.  Will discuss with hospitalist  Final Clinical Impressions(s) / ED Diagnoses   Final diagnoses:  Fever, unspecified fever cause  Pneumonia due to infectious organism, unspecified laterality, unspecified part of lung    ED Discharge Orders    None       Davonna Belling, MD 04/24/18 1304

## 2018-04-24 NOTE — Progress Notes (Signed)
Pharmacy Antibiotic Note  Stacy Shelton is a 58 y.o. female admitted on 04/24/2018 with pneumonia.  Pharmacy has been consulted for Vancomycin dosing. Patient has also been placed on Cefepime per MD.   Plan: Vancomycin 1g IV x 1 given in the ED. Continue with Vancomycin 750mg  IV q12h, start early at 1800 since did not receive full loading dose. Goal AUC 400-550.   Plan for Vancomycin levels at steady state, as indicated. Cefepime 1g IV q8h per MD Monitor renal function, cultures, clinical course.     Temp (24hrs), Avg:99.7 F (37.6 C), Min:99 F (37.2 C), Max:100.4 F (38 C)  Recent Labs  Lab 04/24/18 0930  WBC 5.6  CREATININE 0.57  LATICACIDVEN 0.9    Estimated Creatinine Clearance: 72.5 mL/min (by C-G formula based on SCr of 0.57 mg/dL).    Allergies  Allergen Reactions  . Nsaids Anaphylaxis    Antimicrobials this admission: 3/26 Vancomycin >> 3/26 Cefepime >>   Dose adjustments this admission: --  Microbiology results: 3/26 BCx: sent 3/26 Strep pneumo ur ag: ordered Sputum: ordered    Thank you for allowing pharmacy to be a part of this patient's care.   Lindell Spar, PharmD, BCPS Pager: [phone] 04/24/2018 3:33 PM

## 2018-04-24 NOTE — ED Triage Notes (Signed)
Pt recently admitted for PNA, and was D/C last Wednesday.  Pt woke up this am with a temperature of 101.3.  Pt hx of stage IV CA with mets.

## 2018-04-25 ENCOUNTER — Inpatient Hospital Stay: Payer: BLUE CROSS/BLUE SHIELD

## 2018-04-25 ENCOUNTER — Inpatient Hospital Stay (HOSPITAL_COMMUNITY): Payer: BLUE CROSS/BLUE SHIELD

## 2018-04-25 DIAGNOSIS — J181 Lobar pneumonia, unspecified organism: Secondary | ICD-10-CM

## 2018-04-25 DIAGNOSIS — R5081 Fever presenting with conditions classified elsewhere: Secondary | ICD-10-CM

## 2018-04-25 DIAGNOSIS — R0902 Hypoxemia: Secondary | ICD-10-CM

## 2018-04-25 DIAGNOSIS — J9601 Acute respiratory failure with hypoxia: Secondary | ICD-10-CM

## 2018-04-25 LAB — GLUCOSE, CAPILLARY
GLUCOSE-CAPILLARY: 107 mg/dL — AB (ref 70–99)
Glucose-Capillary: 147 mg/dL — ABNORMAL HIGH (ref 70–99)
Glucose-Capillary: 100 mg/dL — ABNORMAL HIGH (ref 70–99)
Glucose-Capillary: 153 mg/dL — ABNORMAL HIGH (ref 70–99)

## 2018-04-25 LAB — BASIC METABOLIC PANEL
Anion gap: 8 (ref 5–15)
BUN: 9 mg/dL (ref 6–20)
CO2: 25 mmol/L (ref 22–32)
Calcium: 8.2 mg/dL — ABNORMAL LOW (ref 8.9–10.3)
Chloride: 104 mmol/L (ref 98–111)
Creatinine, Ser: 0.57 mg/dL (ref 0.44–1.00)
GFR calc Af Amer: >60 mL/min (ref >60)
GFR calc non Af Amer: >60 mL/min (ref >60)
Glucose, Bld: 151 mg/dL — ABNORMAL HIGH (ref 70–99)
Potassium: 4 mmol/L (ref 3.5–5.1)
SODIUM: 137 mmol/L (ref 135–145)

## 2018-04-25 LAB — RESPIRATORY PANEL BY PCR

## 2018-04-25 LAB — CBC WITH DIFFERENTIAL/PLATELET
Abs Immature Granulocytes: 0.1 10*3/uL — ABNORMAL HIGH (ref 0.00–0.07)
Band Neutrophils: 2 %
Basophils Absolute: 0.2 10*3/uL — ABNORMAL HIGH (ref 0.0–0.1)
Basophils Relative: 4 %
Eosinophils Absolute: 0 10*3/uL (ref 0.0–0.5)
Eosinophils Relative: 1 %
HCT: 22.1 % — ABNORMAL LOW (ref 36.0–46.0)
Hemoglobin: 6.7 g/dL — CL (ref 12.0–15.0)
Lymphocytes Relative: 15 %
Lymphs Abs: 0.7 10*3/uL (ref 0.7–4.0)
MCH: 26.6 pg (ref 26.0–34.0)
MCHC: 30.3 g/dL (ref 30.0–36.0)
MCV: 87.7 fL (ref 80.0–100.0)
MONOS PCT: 7 %
MYELOCYTES: 2 %
Monocytes Absolute: 0.3 10*3/uL (ref 0.1–1.0)
Neutro Abs: 3.5 10*3/uL (ref 1.7–7.7)
Neutrophils Relative %: 69 %
Platelets: 63 10*3/uL — ABNORMAL LOW (ref 150–400)
RBC: 2.52 MIL/uL — ABNORMAL LOW (ref 3.87–5.11)
RDW: 19.2 % — ABNORMAL HIGH (ref 11.5–15.5)
WBC: 4.9 10*3/uL (ref 4.0–10.5)
nRBC: 0.4 % — ABNORMAL HIGH (ref 0.0–0.2)

## 2018-04-25 LAB — INFLUENZA PANEL BY PCR (TYPE A & B)
Influenza A By PCR: NEGATIVE
Influenza B By PCR: NEGATIVE

## 2018-04-25 LAB — PREPARE RBC (CROSSMATCH)

## 2018-04-25 LAB — RETICULOCYTES
Immature Retic Fract: 4.8 % (ref 2.3–15.9)
RBC.: 2.72 MIL/uL — ABNORMAL LOW (ref 3.87–5.11)
Retic Count, Absolute: 32.4 10*3/uL (ref 19.0–186.0)
Retic Ct Pct: 1.2 % (ref 0.4–3.1)

## 2018-04-25 MED ORDER — PRO-STAT SUGAR FREE PO LIQD
30.0000 mL | Freq: Every day | ORAL | Status: DC
  Administered 2018-04-25 – 2018-05-06 (×10): 30 mL via ORAL
  Filled 2018-04-25 (×11): qty 30

## 2018-04-25 MED ORDER — PIPERACILLIN-TAZOBACTAM 3.375 G IVPB
3.3750 g | Freq: Three times a day (TID) | INTRAVENOUS | Status: DC
  Administered 2018-04-25 – 2018-04-26 (×3): 3.375 g via INTRAVENOUS
  Filled 2018-04-25 (×3): qty 50

## 2018-04-25 MED ORDER — IOHEXOL 300 MG/ML  SOLN
75.0000 mL | Freq: Once | INTRAMUSCULAR | Status: AC | PRN
  Administered 2018-04-25: 75 mL via INTRAVENOUS

## 2018-04-25 MED ORDER — SODIUM CHLORIDE (PF) 0.9 % IJ SOLN
INTRAMUSCULAR | Status: AC
  Administered 2018-04-25: 10 mL
  Filled 2018-04-25: qty 50

## 2018-04-25 MED ORDER — ENSURE ENLIVE PO LIQD
237.0000 mL | ORAL | Status: DC
  Administered 2018-04-26 – 2018-05-05 (×10): 237 mL via ORAL

## 2018-04-25 MED ORDER — SODIUM CHLORIDE 0.9% IV SOLUTION: Freq: Once | INTRAVENOUS | Status: DC

## 2018-04-25 MED ORDER — ADULT MULTIVITAMIN W/MINERALS CH
1.0000 | ORAL_TABLET | Freq: Every day | ORAL | Status: DC
  Administered 2018-04-25 – 2018-05-06 (×12): 1 via ORAL
  Filled 2018-04-25 (×12): qty 1

## 2018-04-25 MED ORDER — PIPERACILLIN-TAZOBACTAM 3.375 G IVPB 30 MIN: 3.3750 g | Freq: Four times a day (QID) | INTRAVENOUS | Status: DC

## 2018-04-25 NOTE — Consult Note (Addendum)
Reason for Consult: Retropharyngeal effusion, fever, cough Referring Physician: Thomes Dinning, MD  HPI:  Stacy Shelton is an 58 y.o. female who was admitted yesterday for fever, shortness of breath, and cough. She was recently discharged from the hospital on 3/18 after being treated for pneumonia and sepsis. She has not been tested for COVID-19. In addition, the patient also noted right sided neck swelling. She has mild odynophagia, no significant dysphagia. She has a medical history significant of stage IV breast cancer; DM; and diastolic CHF. Her CT scan today shows retropharyngeal effusion.    Past Medical History:  Diagnosis Date  . Asthma   . Diabetes ([facility] [place])   . Metastatic breast cancer ([place])   . Seasonal allergies     Past Surgical History:  Procedure Laterality Date  . IR IMAGING GUIDED PORT INSERTION  03/24/2018  . KIDNEY STONE SURGERY    . SINOSCOPY    . WISDOM TOOTH EXTRACTION      Family History  Problem Relation Age of Onset  . Allergic rhinitis Neg Hx   . Angioedema Neg Hx   . Asthma Neg Hx   . Atopy Neg Hx   . Eczema Neg Hx   . Immunodeficiency Neg Hx   . Urticaria Neg Hx     Social History:  reports that she has never smoked. She has never used smokeless tobacco. She reports current alcohol use. She reports that she does not use drugs.  Allergies:  Allergies  Allergen Reactions  . Nsaids Anaphylaxis    Prior to Admission medications   Medication Sig Start Date End Date Taking? Authorizing Provider  albuterol (PROAIR HFA) 108 (90 Base) MCG/ACT inhaler Inhale 2 puffs into the lungs every 6 (six) hours as needed for wheezing or shortness of breath. 05/16/17  Yes Padgett, Rae Halsted, MD  dexamethasone (DECADRON) 4 MG tablet Take 1 tablet in the morning before chemo, none on the day of chemo and daily in the morning after chemo for 2 days, with food 03/21/18  Yes Alvy Bimler, Ni, MD  esomeprazole (NEXIUM) 40 MG capsule Take 1 capsule (40 mg total) by mouth 2 (two)  times daily before a meal. 05/16/17  Yes Padgett, Rae Halsted, MD  fluticasone (FLOVENT HFA) 44 MCG/ACT inhaler Inhale 2 puffs into the lungs 2 (two) times daily. 05/16/17  Yes Padgett, Rae Halsted, MD  insulin aspart (NOVOLOG) 100 UNIT/ML FlexPen Inject 8 Units into the skin 3 (three) times daily with meals. 03/13/18  Yes Donne Hazel, MD  Insulin Detemir (LEVEMIR) 100 UNIT/ML Pen Inject 20 Units into the skin daily. 03/13/18  Yes Donne Hazel, MD  lidocaine-prilocaine (EMLA) cream Apply to affected area once Patient taking differently: Apply 1 application topically as needed (port access). Apply to affected area once 03/21/18  Yes Gorsuch, Ni, MD  lip balm (CARMEX) ointment Apply topically as needed for lip care. 04/16/18  Yes Purohit, Konrad Dolores, MD  loperamide (IMODIUM A-D) 2 MG tablet Take 1 tablet (2 mg total) by mouth 4 (four) times daily as needed for diarrhea or loose stools. 04/16/18  Yes Purohit, Konrad Dolores, MD  LORazepam (ATIVAN) 0.5 MG tablet Take 1 tab po 30 minutes prior to radiation or MRI 03/14/18  Yes Hayden Pedro, PA-C  metFORMIN (GLUCOPHAGE) 500 MG tablet Take 1 tablet (500 mg total) by mouth 2 (two) times daily with a meal. Patient taking differently: Take 500 mg by mouth daily.  03/17/18  Yes Gorsuch, Ni, MD  metoprolol succinate (TOPROL-XL) 50 MG  24 hr tablet Take 50 mg by mouth every evening. 03/10/18  Yes [provider]  montelukast (SINGULAIR) 10 MG tablet TAKE 1 TABLET BY MOUTH EVERYDAY AT BEDTIME Patient taking differently: Take 10 mg by mouth daily.  08/12/17  Yes Kennith Gain, MD  ACCU-CHEK AVIVA PLUS test strip  03/13/18   [provider]  ACCU-CHEK SOFTCLIX LANCETS lancets  03/13/18   [provider]  blood glucose meter kit and supplies KIT Dispense based on patient and insurance preference. Use up to four times daily as directed. (FOR ICD-9 250.00, 250.01). 03/13/18   Donne Hazel, MD  Insulin Pen Needle 31G X 5 MM  MISC 1 Device by Does not apply route QID. For use with insulin pens 03/13/18   Donne Hazel, MD    Medications:  I have reviewed the patient's current medications. Scheduled: . sodium chloride   Intravenous Once  . [START ON 04/26/2018] feeding supplement (ENSURE ENLIVE)  237 mL Oral Q24H  . feeding supplement (PRO-STAT SUGAR FREE 64)  30 mL Oral Daily  . insulin aspart  0-15 Units Subcutaneous TID WC  . insulin aspart  0-5 Units Subcutaneous QHS  . insulin detemir  20 Units Subcutaneous Daily  . metoprolol succinate  50 mg Oral QPM  . montelukast  10 mg Oral Daily  . multivitamin with minerals  1 tablet Oral Daily  . pantoprazole  40 mg Oral BID   Continuous: . sodium chloride 75 mL/hr at 04/25/18 0545  . piperacillin-tazobactam (ZOSYN)  IV    . vancomycin 750 mg (04/25/18 0547)   GQQ:PYPPJKDTOIZTI, alum & mag hydroxide-simeth, guaiFENesin-dextromethorphan, ondansetron (ZOFRAN) IV, sodium chloride flush  Results for orders placed or performed during the hospital encounter of 04/24/18 (from the past 48 hour(s))  Lactic acid, plasma     Status: None   Collection Time: 04/24/18  9:30 AM  Result Value Ref Range   Lactic Acid, Venous 0.9 0.5 - 1.9 mmol/L    Comment: Performed at [facility], [place] [address], [place], [place] [address]  Comprehensive metabolic panel     Status: Abnormal   Collection Time: 04/24/18  9:30 AM  Result Value Ref Range   Sodium 133 (L) 135 - 145 mmol/L   Potassium 3.9 3.5 - 5.1 mmol/L   Chloride 97 (L) 98 - 111 mmol/L   CO2 27 22 - 32 mmol/L   Glucose, Bld 138 (H) 70 - 99 mg/dL   BUN 11 6 - 20 mg/dL   Creatinine, Ser 0.57 0.44 - 1.00 mg/dL   Calcium 8.8 (L) 8.9 - 10.3 mg/dL   Total Protein 7.3 6.5 - 8.1 g/dL   Albumin 2.9 (L) 3.5 - 5.0 g/dL   AST 33 15 - 41 U/L   ALT 34 0 - 44 U/L   Alkaline Phosphatase 247 (H) 38 - 126 U/L   Total Bilirubin 2.1 (H) 0.3 - 1.2 mg/dL   GFR calc non Af Amer >60 >60 mL/min   GFR calc Af Amer >60  >60 mL/min   Anion gap 9 5 - 15    Comment: Performed at [facility], [place] [address], [place], [place] [address]  CBC WITH DIFFERENTIAL     Status: Abnormal   Collection Time: 04/24/18  9:30 AM  Result Value Ref Range   WBC 5.6 4.0 - 10.5 K/uL   RBC 2.83 (L) 3.87 - 5.11 MIL/uL   Hemoglobin 7.5 (L) 12.0 - 15.0 g/dL   HCT 25.1 (L)  36.0 - 46.0 %   MCV 88.7 80.0 - 100.0 fL   MCH 26.5 26.0 - 34.0 pg   MCHC 29.9 (L) 30.0 - 36.0 g/dL   RDW 18.9 (H) 11.5 - 15.5 %   Platelets 77 (L) 150 - 400 K/uL    Comment: Immature Platelet Fraction may be clinically indicated, consider ordering this additional test [id number] CONSISTENT WITH PREVIOUS RESULT    nRBC 0.5 (H) 0.0 - 0.2 %   Neutrophils Relative % 59 %   Neutro Abs 3.7 1.7 - 7.7 K/uL   Band Neutrophils 7 %   Lymphocytes Relative 20 %   Lymphs Abs 1.1 0.7 - 4.0 K/uL   Monocytes Relative 10 %   Monocytes Absolute 0.6 0.1 - 1.0 K/uL   Eosinophils Relative 1 %   Eosinophils Absolute 0.1 0.0 - 0.5 K/uL   Basophils Relative 0 %   Basophils Absolute 0.0 0.0 - 0.1 K/uL   WBC Morphology MILD LEFT SHIFT (1-5% METAS, OCC MYELO, OCC BANDS)    Other 1 %   Myelocytes 2 %   Abs Immature Granulocytes 0.10 (H) 0.00 - 0.07 K/uL   Polychromasia PRESENT     Comment: Performed at [facility], [place] [address], [place], [place] [address]  Blood Culture (routine x 2)     Status: None (Preliminary result)   Collection Time: 04/24/18  9:30 AM  Result Value Ref Range   Specimen Description      BLOOD RIGHT CHEST Performed at [place] [address], [place], [place] [address]    Special Requests      BOTTLES DRAWN AEROBIC AND ANAEROBIC Blood Culture adequate volume Performed at [place] [address] [place], [place] [address]    Culture      NO GROWTH < 24 HOURS Performed at [place] [address], [place], [place] [address]    Report Status PENDING    Blood Culture (routine x 2)     Status: None (Preliminary result)   Collection Time: 04/24/18  9:30 AM  Result Value Ref Range   Specimen Description      BLOOD RIGHT ANTECUBITAL Performed at [place] [address], [place], [place] [address]    Special Requests      BOTTLES DRAWN AEROBIC AND ANAEROBIC Blood Culture adequate volume Performed at [facility] [address], [place], [place] [address]    Culture      NO GROWTH < 24 HOURS Performed at [place] [address], [place], [place] [address]    Report Status PENDING   Urinalysis, Routine w reflex microscopic     Status: Abnormal   Collection Time: 04/24/18  9:30 AM  Result Value Ref Range   Color, Urine YELLOW YELLOW   APPearance CLEAR CLEAR   Specific Gravity, Urine 1.016 1.005 - 1.030   pH 5.0 5.0 - 8.0   Glucose, UA NEGATIVE NEGATIVE mg/dL   Hgb urine dipstick NEGATIVE NEGATIVE   Bilirubin Urine NEGATIVE NEGATIVE   Ketones, ur 5 (A) NEGATIVE mg/dL   Protein, ur NEGATIVE NEGATIVE mg/dL   Nitrite NEGATIVE NEGATIVE   Leukocytes,Ua NEGATIVE NEGATIVE    Comment: Performed at [place] [address], [place], [place] [address]  Strep pneumoniae urinary antigen     Status: None   Collection Time: 04/24/18  9:30 AM  Result Value Ref Range   Strep Pneumo Urinary Antigen NEGATIVE NEGATIVE  Comment:        Infection due to S. pneumoniae cannot be absolutely ruled out since the antigen present may be below the detection limit of the test. PERFORMED AT [facility] Performed at [facility], [place] [address], [place], [place] [address]   Glucose, capillary     Status: Abnormal   Collection Time: 04/24/18  5:11 PM  Result Value Ref Range   Glucose-Capillary 105 (H) 70 - 99 mg/dL  Lactic acid, plasma     Status: None   Collection Time: 04/24/18  5:54 PM  Result Value Ref Range   Lactic Acid, Venous 0.7 0.5 - 1.9 mmol/L     Comment: Performed at [facility], [place] [address] [place], [place] [address]  Procalcitonin     Status: None   Collection Time: 04/24/18  5:54 PM  Result Value Ref Range   Procalcitonin 0.38 ng/mL    Comment:        Interpretation: PCT (Procalcitonin) <= 0.5 ng/mL: Systemic infection (sepsis) is not likely. Local bacterial infection is possible. (NOTE)       Sepsis PCT Algorithm           Lower Respiratory Tract                                      Infection PCT Algorithm    ----------------------------     ----------------------------         PCT < 0.25 ng/mL                PCT < 0.10 ng/mL         Strongly encourage             Strongly discourage   discontinuation of antibiotics    initiation of antibiotics    ----------------------------     -----------------------------       PCT 0.25 - 0.50 ng/mL            PCT 0.10 - 0.25 ng/mL               OR       >80% decrease in PCT            Discourage initiation of                                            antibiotics      Encourage discontinuation           of antibiotics    ----------------------------     -----------------------------         PCT >= 0.50 ng/mL              PCT 0.26 - 0.50 ng/mL               AND        <80% decrease in PCT             Encourage initiation of                                             antibiotics       Encourage continuation  of antibiotics    ----------------------------     -----------------------------        PCT >= 0.50 ng/mL                  PCT > 0.50 ng/mL               AND         increase in PCT                  Strongly encourage                                      initiation of antibiotics    Strongly encourage escalation           of antibiotics                                     -----------------------------                                           PCT <= 0.25 ng/mL                                                 OR                                         > 80% decrease in PCT                                     Discontinue / Do not initiate                                             antibiotics Performed at [facility] [address] [place], [place] [address]   Glucose, capillary     Status: Abnormal   Collection Time: 04/24/18  8:37 PM  Result Value Ref Range   Glucose-Capillary 170 (H) 70 - 99 mg/dL  Basic metabolic panel     Status: Abnormal   Collection Time: 04/25/18  4:43 AM  Result Value Ref Range   Sodium 137 135 - 145 mmol/L   Potassium 4.0 3.5 - 5.1 mmol/L   Chloride 104 98 - 111 mmol/L   CO2 25 22 - 32 mmol/L   Glucose, Bld 151 (H) 70 - 99 mg/dL   BUN 9 6 - 20 mg/dL   Creatinine, Ser 0.57 0.44 - 1.00 mg/dL   Calcium 8.2 (L) 8.9 - 10.3 mg/dL   GFR calc non Af Amer >60 >60 mL/min   GFR calc Af Amer >60 >60 mL/min   Anion gap 8 5 - 15    Comment: Performed at [facility], [place] [address], [place], [place] [address]  CBC WITH DIFFERENTIAL     Status: Abnormal  Collection Time: 04/25/18  4:43 AM  Result Value Ref Range   WBC 4.9 4.0 - 10.5 K/uL   RBC 2.52 (L) 3.87 - 5.11 MIL/uL   Hemoglobin 6.7 (LL) 12.0 - 15.0 g/dL    Comment: This critical result has verified and been called to RN MURRELL K by Alda Lea on 03 27 2020 at 0513, and has been read back.  This critical result has verified and been called to RN MURRELL K by Alda Lea on 03 27 2020 at 0514, and has been read back.     HCT 22.1 (L) 36.0 - 46.0 %   MCV 87.7 80.0 - 100.0 fL   MCH 26.6 26.0 - 34.0 pg   MCHC 30.3 30.0 - 36.0 g/dL   RDW 19.2 (H) 11.5 - 15.5 %   Platelets 63 (L) 150 - 400 K/uL    Comment: REPEATED TO VERIFY Immature Platelet Fraction may be clinically indicated, consider ordering this additional test [id number] CONSISTENT WITH PREVIOUS RESULT    nRBC 0.4 (H) 0.0 - 0.2 %   Neutrophils Relative % 69 %   Neutro Abs 3.5 1.7 - 7.7 K/uL   Band Neutrophils 2 %   Lymphocytes  Relative 15 %   Lymphs Abs 0.7 0.7 - 4.0 K/uL   Monocytes Relative 7 %   Monocytes Absolute 0.3 0.1 - 1.0 K/uL   Eosinophils Relative 1 %   Eosinophils Absolute 0.0 0.0 - 0.5 K/uL   Basophils Relative 4 %   Basophils Absolute 0.2 (H) 0.0 - 0.1 K/uL   WBC Morphology MILD LEFT SHIFT (1-5% METAS, OCC MYELO, OCC BANDS)    Myelocytes 2 %   Abs Immature Granulocytes 0.10 (H) 0.00 - 0.07 K/uL   Dohle Bodies PRESENT    Tear Drop Cells PRESENT     Comment: Performed at [facility], [place] [address] [place], [place] [address]  Type and screen [place]     Status: None (Preliminary result)   Collection Time: 04/25/18  6:37 AM  Result Value Ref Range   ABO/RH(D) A POS    Antibody Screen NEG    Sample Expiration 04/28/2018    Unit Number [id number]    Blood Component Type RBC, LR IRR    Unit division 00    Status of Unit ISSUED    Transfusion Status OK TO TRANSFUSE    Crossmatch Result      Compatible Performed at [facility], [place] [address], [place], [place] [address]   Prepare RBC     Status: None   Collection Time: 04/25/18  6:37 AM  Result Value Ref Range   Order Confirmation      ORDER PROCESSED BY BLOOD BANK Performed at [facility], [place] [address], [place], [place] [address]   Glucose, capillary     Status: Abnormal   Collection Time: 04/25/18  7:22 AM  Result Value Ref Range   Glucose-Capillary 147 (H) 70 - 99 mg/dL  Influenza panel by PCR (type A & B)     Status: None   Collection Time: 04/25/18 11:52 AM  Result Value Ref Range   Influenza A By PCR NEGATIVE NEGATIVE   Influenza B By PCR NEGATIVE NEGATIVE    Comment: (NOTE) The Xpert Xpress Flu assay is intended as an aid in the diagnosis of  influenza and should not be used as a sole basis for treatment.  This  assay is FDA approved for nasopharyngeal swab specimens only.  Nasal  washings and aspirates are unacceptable for Xpert Xpress Flu  testing. Performed at [facility], [place] [address] [place], [place] [address]   Glucose, capillary     Status: Abnormal   Collection Time: 04/25/18 12:26 PM  Result Value Ref Range   Glucose-Capillary 100 (H) 70 - 99 mg/dL    Ct Soft Tissue Neck W Contrast  Result Date: 04/25/2018 CLINICAL DATA:  Fever. Recent admission for pneumonia and sepsis. Metastatic breast cancer. EXAM: CT NECK WITH CONTRAST TECHNIQUE: Multidetector CT imaging of the neck was performed using the standard protocol following the bolus administration of intravenous contrast. CONTRAST:  77m OMNIPAQUE IOHEXOL 300 MG/ML  SOLN COMPARISON:  None. FINDINGS: Pharynx and larynx: Moderately large retropharyngeal fluid collection compatible with effusion. This measures up to 10 mm in thickness. This appears to extend into the right neck where there is soft tissue edema around the right carotid artery extending into the right neck soft tissues. No pharyngeal or tonsillar abscess. Epiglottis normal Salivary glands: No inflammation, mass, or stone. There is edema surrounding the right submandibular gland which appears extrinsic to the gland. Thyroid: Negative Lymph nodes: Right lateral lymph node measuring 7.6 mm likely reactive,, with surrounding edema. Otherwise no enlarged or prominent lymph nodes. Vascular: Right jugular Port-A-Cath in good position. Jugular vein patent bilaterally. Normal vascular enhancement. Prominent soft tissue is seen surrounding the right carotid artery. This is likely inflammatory given the retropharyngeal effusion as well as recent sepsis. Limited intracranial: No acute abnormality. Known right occipital metastatic deposit not imaged on the current study. Visualized orbits: Negative Mastoids and visualized paranasal sinuses: Mucoperiosteal thickening in the paranasal sinuses with prior sinus surgery. Orbits not imaged Skeleton: No acute skeletal abnormality. Upper chest: Right upper lobe nodular  densities. Interval improvement in right upper lobe airspace disease since 04/08/2018. Residual nodule densities may be metastatic disease or pneumonia. Small bilateral pleural effusions. Other: None IMPRESSION: Retropharyngeal fluid collection compatible with effusion or possibly abscess. Soft tissue thickening extends into the right neck and surrounds the right carotid bifurcation and right internal carotid artery. There is also streaky density in the right lateral neck soft tissues with a focal 7.6 Mm lymph node in the right lateral neck. Stranding surrounds the right submandibular gland. Findings are most compatible with infection. Favor pharyngitis with retropharyngeal effusion/abscess. Soft tissue thickening around the right carotid most compatible with infection rather than tumor. Right jugular vein is patent. Multiple nodular densities in the right upper lobe may represent residual pneumonia or metastatic disease. Interval improvement in right upper lobe infiltrate compared with CT of 04/08/2018 These results were called by telephone at the time of interpretation on 04/25/2018 at 12:36 pm to Dr. SThomes Dinning, who verbally acknowledged these results. Electronically Signed   By: CFranchot GalloM.D.   On: 04/25/2018 12:39   Dg Chest Port 1 View  Result Date: 04/24/2018 CLINICAL DATA:  Fever.  Metastatic breast carcinoma EXAM: PORTABLE CHEST 1 VIEW COMPARISON:  April 12, 2018 FINDINGS: There has been significant clearing of consolidation from the right upper lobe. A small amount of patchy opacity with volume loss remains in this area. Atelectasis remains in the left base. No new opacity evident. Heart is upper normal in size with pulmonary vascularity normal. No adenopathy. There is aortic atherosclerosis. Port-A-Cath tip is in the superior vena cava. No pneumothorax. No bone lesions are appreciable. IMPRESSION: Significant partial clearing of consolidation from the right upper lobe. Patchy infiltrate and  volume loss remains in  this area. Stable left base atelectasis. No new opacity. Stable cardiac silhouette. Stable Port-A-Cath positioning. Aortic Atherosclerosis (ICD10-I70.0). Electronically Signed   By: Lowella Grip III M.D.   On: 04/24/2018 09:32   Review of Systems (per chart review) Constitutional: Positive for appetite change, chills and fever.  HENT: Negative for congestion.   Respiratory: Positive for cough and shortness of breath.   Gastrointestinal: Positive for nausea. Negative for abdominal pain.  Genitourinary: Negative for dysuria.  Musculoskeletal: Negative for back pain.  Skin: Negative for rash.  Neurological: Positive for weakness.   Blood pressure (!) 100/48, pulse 100, temperature 100.1 F (37.8 C), temperature source Oral, resp. rate 20, height 5' 3"  (1.6 m), weight 66.3 kg, last menstrual period 10/30/2010, SpO2 99 %. Physical Exam Vitals signs and nursing note reviewed.  Constitutional: patient awake and alert. NAD. Eyes: Pupils are equal, round, reactive to light. Extraocular motion is intact.  Ears: Examination of the ears shows normal auricles and external auditory canals bilaterally.  Nose: Nasal examination shows normal mucosa, septum, turbinates.  Face: Facial examination shows no asymmetry. Palpation of the face elicit no significant tenderness.  Mouth: Oral cavity examination shows no mucosal lacerations. No significant trismus is noted. No purulent drainage. Neck: Palpation of the neck reveals mild tenderness bilaterally. LAD noted on the right. The trachea is midline. The thyroid is not significantly enlarged.  Neuro: Cranial nerves 2-12 are all grossly in tact. Pulmonary: No stridor. No acute distress. Skin: Skin is warm. Capillary refill takes less than 2 seconds.  Neurological:     Mental Status: Mental status is normal  Assessment/Plan: Retropharyngeal effusion in the setting of recent pneumonia and sepsis. Currently the patient also has fever,  cough, and SOB. - Recommend testing for COVID-19 infection. - CT scan reviewed. Recommend continuing IV antibiotic treatment. Retropharyngeal effusion can often be treated with medical therapy alone. - Will follow the patient while she is in the hospital.  Karsyn Jamie W Ellionna Buckbee 04/25/2018, 4:39 PM

## 2018-04-25 NOTE — Progress Notes (Signed)
PROGRESS NOTE    Stacy Shelton  YIA:[id number] DOB: 24-Feb-1960 DOA: 04/24/2018 PCP: Robyne Peers, MD  Brief Narrative:  58 year old with past medical history relevant for for metastatic breast cancer, type 2 diabetes, chronic diastolic heart failure, hypertension who was initially hospitalized from 04/07/2018 to 04/16/2018 for septic shock thought to be secondary to pneumonia who presents to the emergency department with recurrent fevers and cough.   Assessment & Plan:   Principal Problem:   Fever Active Problems:   Uncontrolled diabetes mellitus with hyperglycemia (HCC)   Metastatic breast cancer ([place])   Acute respiratory failure with hypoxia (HCC)   Left lower lobe pneumonia (HCC)   Chronic diastolic CHF (congestive heart failure) (HCC)   Anemia associated with chemotherapy   Chemotherapy-induced thrombocytopenia   #) Recurrent fever/cough: Unclear etiology of these recurrent fevers.  Chest x-ray from admission actually shows improvement in the right upper lobe and continued patchy infiltrate.  She does not have any dysuria.  Her procalcitonin is in fact much lower than before.  It is unclear what the relationship is between this tender lymph node in her right cervical neck and her current symptoms. - Continue IV cefepime and vancomycin started 04/24/2018 -Follow-up blood cultures ordered 04/24/2018 -We will obtain respiratory viral panel and flu panel  #) COPD/asthma: -Continue PRN short-acting bronchodilators - Continue fluticasone twice daily  #) Tender right lymphadenopathy: This appears to spread up overnight.  She does not have any embarrassment of her respiratory system. -We will order CT of the head and neck  #) Type 2 diabetes: -Hold aspart 8 units with meals -Continue detemir 20 units nightly -Hold metformin  #) Chronic diastolic heart failure: Patient appears to be euvolemic to hypovolemic currently.  #) Hypertension: -Continue metoprolol succinate 50 mg nightly   #) Stage IV metastatic breast cancer: -Oncology notified   Fluids: Gentle IV fluids Electrolytes: Monitor and supplement Nutrition: Carb restricted diet  Prophylaxis: Enoxaparin  Disposition: Pending etiology and resolution of fevers  Full code   Consultants:   Oncology, Dr. Alvy Bimler  Procedures:   None  Antimicrobials:   IV cefepime and vancomycin started 04/24/2018   Subjective: This morning the patient reports that overnight she began to have swelling in her right cervical lymph node and it is quite tender.  She reports she can feel it when she swallows but denies any trouble breathing or dysphagia or odynophagia.  She is not any cough, congestion, rhinorrhea, nausea, vomiting, diarrhea.  Objective: Vitals:   04/25/18 0030 04/25/18 0237 04/25/18 0533 04/25/18 0826  BP: (!) 101/51 (!) 110/56 (!) 117/57   Pulse: (!) 101 98 (!) 110   Resp: 20 20 18    Temp: 100.3 F (37.9 C) 98.8 F (37.1 C) (!) 102.9 F (39.4 C)   TempSrc: Oral Oral Oral   SpO2:  95% 94% 95%  Height:        Intake/Output Summary (Last 24 hours) at 04/25/2018 0847 Last data filed at 04/25/2018 0300 Gross per 24 hour  Intake 3337.65 ml  Output -  Net 3337.65 ml   There were no vitals filed for this visit.  Examination:  General exam: Appears calm and comfortable  Respiratory system: No increased work of breathing, anterior lung sounds clear, no wheezes, crackles, rhonchi Cardiovascular system: Distant heart sounds, regular rate and rhythm, no murmurs Gastrointestinal system: Soft, nondistended, no rebound or guarding, plus bowel sounds Central nervous system: Alert and oriented.  Grossly intact, moving all extremities Extremities: No lower extremity edema Skin: Right  anterior cervical chain lymph node, swollen, tender, warm Psychiatry: Judgement and insight appear normal. Mood & affect appropriate.     Data Reviewed: I have personally reviewed following labs and imaging studies  CBC:  Recent Labs  Lab 04/24/18 0930 04/25/18 0443  WBC 5.6 4.9  NEUTROABS 3.7 3.5  HGB 7.5* 6.7*  HCT 25.1* 22.1*  MCV 88.7 87.7  PLT 77* 63*   Basic Metabolic Panel: Recent Labs  Lab 04/24/18 0930 04/25/18 0443  NA 133* 137  K 3.9 4.0  CL 97* 104  CO2 27 25  GLUCOSE 138* 151*  BUN 11 9  CREATININE 0.57 0.57  CALCIUM 8.8* 8.2*   GFR: Estimated Creatinine Clearance: 74.2 mL/min (by C-G formula based on SCr of 0.57 mg/dL). Liver Function Tests: Recent Labs  Lab 04/24/18 0930  AST 33  ALT 34  ALKPHOS 247*  BILITOT 2.1*  PROT 7.3  ALBUMIN 2.9*   No results for input(s): LIPASE, AMYLASE in the last 168 hours. No results for input(s): AMMONIA in the last 168 hours. Coagulation Profile: No results for input(s): INR, PROTIME in the last 168 hours. Cardiac Enzymes: No results for input(s): CKTOTAL, CKMB, CKMBINDEX, TROPONINI in the last 168 hours. BNP (last 3 results) No results for input(s): PROBNP in the last 8760 hours. HbA1C: No results for input(s): HGBA1C in the last 72 hours. CBG: Recent Labs  Lab 04/24/18 1711 04/24/18 2037 04/25/18 0722  GLUCAP 105* 170* 147*   Lipid Profile: No results for input(s): CHOL, HDL, LDLCALC, TRIG, CHOLHDL, LDLDIRECT in the last 72 hours. Thyroid Function Tests: No results for input(s): TSH, T4TOTAL, FREET4, T3FREE, THYROIDAB in the last 72 hours. Anemia Panel: No results for input(s): VITAMINB12, FOLATE, FERRITIN, TIBC, IRON, RETICCTPCT in the last 72 hours. Sepsis Labs: Recent Labs  Lab 04/24/18 0930 04/24/18 1754  PROCALCITON  --  0.38  LATICACIDVEN 0.9 0.7    Recent Results (from the past 240 hour(s))  Blood Culture (routine x 2)     Status: None (Preliminary result)   Collection Time: 04/24/18  9:30 AM  Result Value Ref Range Status   Specimen Description   Final    BLOOD RIGHT CHEST Performed at [facility] [address] [place], [place] [address]    Special Requests   Final    BOTTLES  DRAWN AEROBIC AND ANAEROBIC Blood Culture adequate volume Performed at [facility] [address] [place], [place] [address]    Culture   Final    NO GROWTH < 24 HOURS Performed at [facility] [address] [place], [place] [address]    Report Status PENDING  Incomplete  Blood Culture (routine x 2)     Status: None (Preliminary result)   Collection Time: 04/24/18  9:30 AM  Result Value Ref Range Status   Specimen Description   Final    BLOOD RIGHT ANTECUBITAL Performed at [facility] [address], [place], [place] [address]    Special Requests   Final    BOTTLES DRAWN AEROBIC AND ANAEROBIC Blood Culture adequate volume Performed at [facility] [address] [place], [place] [address]    Culture   Final    NO GROWTH < 24 HOURS Performed at [facility] [address], [place], [place] [address]    Report Status PENDING  Incomplete         Radiology Studies: Dg Chest Port 1 View  Result Date: 04/24/2018 CLINICAL DATA:  Fever.  Metastatic breast carcinoma EXAM: PORTABLE CHEST 1 VIEW COMPARISON:  April 12, 2018 FINDINGS: There has been significant clearing of consolidation from the right upper lobe. A small amount of patchy opacity with volume loss remains in this area. Atelectasis remains in the left base. No new opacity evident. Heart is upper normal in size with pulmonary vascularity normal. No adenopathy. There is aortic atherosclerosis. Port-A-Cath tip is in the superior vena cava. No pneumothorax. No bone lesions are appreciable. IMPRESSION: Significant partial clearing of consolidation from the right upper lobe. Patchy infiltrate and volume loss remains in this area. Stable left base atelectasis. No new opacity. Stable cardiac silhouette. Stable Port-A-Cath positioning. Aortic Atherosclerosis (ICD10-I70.0). Electronically Signed   By: Lowella Grip III M.D.   On: 04/24/2018 09:32         Scheduled Meds: . sodium chloride   Intravenous Once  . budesonide  0.25 mg Nebulization BID  . feeding supplement (ENSURE ENLIVE)  237 mL Oral BID BM  . insulin aspart  0-15 Units Subcutaneous TID WC  . insulin aspart  0-5 Units Subcutaneous QHS  . insulin detemir  20 Units Subcutaneous Daily  . metoprolol succinate  50 mg Oral QPM  . montelukast  10 mg Oral Daily  . pantoprazole  40 mg Oral BID   Continuous Infusions: . sodium chloride 75 mL/hr at 04/25/18 0545  . ceFEPime (MAXIPIME) IV 1 g (04/25/18 0253)  . vancomycin 750 mg (04/25/18 0547)     LOS: 1 day    Time spent: [place], MD [facility]  If 7PM-7AM, please contact night-coverage [email or web address] Password [facility] 04/25/2018, 8:47 AM

## 2018-04-25 NOTE — Progress Notes (Addendum)
Initial Nutrition Assessment  RD working remotely.   DOCUMENTATION CODES:   (Unable to assess for malnutrition at this time.)  INTERVENTION:  - Will decrease Ensure Enlive to once/day, each supplement provides 350 kcal and 20 grams of protein. - Will order 30 mL Prostat once/day, each supplement provides 100 kcal and 15 grams of protein. - Will order Magic Cup BID with meals, each supplement provides 290 kcal and 9 grams of protein. - Will order daily multivitamin with minerals.  - Continue to encourage PO intakes.  - Weigh patient today.    NUTRITION DIAGNOSIS:   Increased nutrient needs related to chronic illness, catabolic illness, cancer and cancer related treatments as evidenced by estimated needs.  GOAL:   Patient will meet greater than or equal to 90% of their needs  MONITOR:   PO intake, Supplement acceptance, Weight trends, Labs  REASON FOR ASSESSMENT:   Malnutrition Screening Tool  ASSESSMENT:   58 year old with past medical history relevant for metastatic breast cancer, type 2 DM, CHF, and HTN who was initially hospitalized from 3/9-3/18 for septic shock thought to be 2/2 pneumonia. She presented to the ED with recurrent fevers and cough.  Patient has not been weighed this admission. Per review of RN flow sheet, patient consumed 100% of dinner last night. No other intakes documented this admission. Patient had been followed by this RD during admission earlier this month. Patient was refusing Boost Breeze during that time and did not care for Ensure. Ensure Enlive was ordered BID per ONS protocol at the time of admission and patient refused bottle this AM.   Per Dr. Harvel Quale note this AM: recurrent fever and cough with unclear etiology, plan to check flu panel and respiratory viral panel, R lymphadenopathy with plan for CT head and neck. Note states that patient can feel swelling when she swallows that but it has not caused dysphagia or odynophagia.   Per chart  review, weight on 2/24 was 151lb and weight was obtained a few minutes ago which indicates current weight of 146 lb. This indicates 5 lb weight loss (3.3% body weight) in the past month; not significant for time frame. Weight on 2/11 was 155 lb which indicates 9 lb weight loss (6% body weight) in the past 1.5 months; significant for time frame.   During previous admission patient was noted to meet criteria for moderate malnutrition in the context of chronic illness, cancer. Highly suspect that this is still the case, but unable to state this d/t inability to perform NFPE at this time.      Medications reviewed; sliding scale novolog, 20 units levemir/day, 40 mg oral protonix BID. Labs reviewed; CBGs: 147 and 100 mg/dl today, Ca: 8.2 mg/dl. IVF; NS @ 75 ml/hr.    NUTRITION - FOCUSED PHYSICAL EXAM:  Unable to complete at this time.   Diet Order:   Diet Order            Diet Carb Modified Fluid consistency: Thin; Room service appropriate? Yes  Diet effective now              EDUCATION NEEDS:   Not appropriate for education at this time  Skin:  Skin Assessment: Reviewed RN Assessment  Last BM:  3/27  Height:   Ht Readings from Last 1 Encounters:  04/24/18 5\' 3"  (1.6 m)    Weight:   Wt Readings from Last 1 Encounters:  04/14/18 74.6 kg    Ideal Body Weight:  52.27 kg  BMI:  Body mass  index is 29.13 kg/m.  Estimated Nutritional Needs:   Kcal:  5465-0354 kcal  Protein:  115-125 grams  Fluid:  >/= 2 L/day     Jarome Matin, MS, RD, LDN, [facility] Inpatient Clinical Dietitian Pager # [phone] After hours/weekend pager # [phone]

## 2018-04-25 NOTE — TOC Initial Note (Signed)
Transition of Care [facility]) - Initial/Assessment Note    Patient Details  Name: Stacy Shelton MRN: [id number] Date of Birth: 06-Mar-1960  [facility]) CM/SW Contact:    Dessa Phi, RN Phone Number: 04/25/2018, 3:39 PM  Clinical Narrative: Fever. From home. Readmit. Hx: Met Breast Ca. Oncology following. Noted ENT cons. Continue to monitor for d/c plans, & progress.                  Expected Discharge Plan: Home/Self Care Barriers to Discharge: No Barriers Identified   Patient Goals and CMS Choice Patient states their goals for this hospitalization and ongoing recovery are:: (get better)      Expected Discharge Plan and Services Expected Discharge Plan: Home/Self Care   Discharge Planning Services: CM Consult   Living arrangements for the past 2 months: Single Family Home Expected Discharge Date: (unknown)                        Prior Living Arrangements/Services Living arrangements for the past 2 months: Single Family Home Lives with:: Self Patient language and need for interpreter reviewed:: Yes Do you feel safe going back to the place where you live?: Yes      Need for Family Participation in Patient Care: No (Comment) Care giver support system in place?: Yes (comment)   Criminal Activity/Legal Involvement Pertinent to Current Situation/Hospitalization: No - Comment as needed  Activities of Daily Living Home Assistive Devices/Equipment: Eyeglasses ADL Screening (condition at time of admission) Patient's cognitive ability adequate to safely complete daily activities?: Yes Is the patient deaf or have difficulty hearing?: No Does the patient have difficulty seeing, even when wearing glasses/contacts?: No Does the patient have difficulty concentrating, remembering, or making decisions?: No Patient able to express need for assistance with ADLs?: Yes Does the patient have difficulty dressing or bathing?: No Independently performs ADLs?: Yes (appropriate for  developmental age) Does the patient have difficulty walking or climbing stairs?: Yes(secondary to shortness of breath) Weakness of Legs: Both Weakness of Arms/Hands: Both  Permission Sought/Granted Permission sought to share information with : Case Manager Permission granted to share information with : Yes, Verbal Permission Granted  Share Information with NAME: Trudie Reed)     Permission granted to share info w Relationship: (sister)  Permission granted to share info w Contact Information: [phone])  Emotional Assessment Appearance:: Appears stated age Attitude/Demeanor/Rapport: Gracious Affect (typically observed): Accepting Orientation: : Oriented to Self, Oriented to Place, Oriented to Situation, Oriented to  Time Alcohol / Substance Use: Never Used Psych Involvement: No (comment)  Admission diagnosis:  Fever, unspecified fever cause [R50.9] Pneumonia due to infectious organism, unspecified laterality, unspecified part of lung [J18.9] Patient Active Problem List   Diagnosis Date Noted  . Fever 04/24/2018  . Left lower lobe pneumonia ([place]) 04/24/2018  . Chronic diastolic CHF (congestive heart failure) ([place]) 04/24/2018  . Anemia associated with chemotherapy 04/24/2018  . Chemotherapy-induced thrombocytopenia 04/24/2018  . Acute cystitis without hematuria   . Pneumonia of both lungs due to infectious organism   . Malnutrition of moderate degree 04/09/2018  . Acute respiratory failure with hypoxia ([place])   . Septic shock ([place])   . Sepsis ([place]) 04/07/2018  . Hypokalemia 04/07/2018  . Hypomagnesemia 04/07/2018  . Acute lower UTI 04/07/2018  . Hypophosphatemia 04/07/2018  . Diarrhea 03/21/2018  . Metastatic breast cancer ([place]) 03/17/2018  . Metastasis to brain ([place]) 03/17/2018  . Metastasis to liver ([place]) 03/17/2018  .  Metastasis to bone ([place]) 03/17/2018  . Pancytopenia, acquired ([place]) 03/17/2018  . Goals of care, counseling/discussion 03/17/2018  . Encounter for  antineoplastic chemotherapy 03/17/2018  . Absolute anemia 03/11/2018  . Thrombocytopenia ([place]) 03/11/2018  . Uncontrolled diabetes mellitus with hyperglycemia ([place]) 03/11/2018  . Moderate persistent asthma 03/11/2018   PCP:  Robyne Peers, MD Pharmacy:   [id number] [place] [address]Phone: [phone]: [phone]    Social Determinants of Health (SDOH) Interventions    Readmission Risk Interventions Readmission Risk Prevention Plan 04/25/2018  Transportation Screening Complete  Medication Review (Press photographer Complete  PCP or Specialist appointment within 3-5 days of discharge Complete  HRI or HBranchComplete  SW Recovery Care/Counseling Consult Complete  [place] Applicable  Some recent data might be hidden

## 2018-04-25 NOTE — Progress Notes (Signed)
Stacy Shelton   DOB:02/09/1960   HD#:[id number]   QJJ#:[id number]  Oncology follow up   Subjective: I am covering Dr. Imagene Gurney see the patient.  She was admitted yesterday for fever, and neck swollen.  She was recently discharged from hospital after being treated for multifocal pneumonia.  She noticed neck swelling, especially on the right side, with tenderness, and a palpable mass in the right upper neck, along with fever, since last night.  She has mild odynophagia, no significant dysphagia, she still has residual cough from previous pneumonia, moderate dyspnea with cough which is stable.  She is on nasal cannula oxygen due to the hypoxia when she coughs.   Objective:  Vitals:   04/25/18 1139 04/25/18 1349  BP: (!) 104/55 (!) 112/55  Pulse: 99 98  Resp: 18 18  Temp: 98.8 F (37.1 C) 99.6 F (37.6 C)  SpO2: 96% 100%    Body mass index is 25.89 kg/m.  Intake/Output Summary (Last 24 hours) at 04/25/2018 1356 Last data filed at 04/25/2018 0300 Gross per 24 hour  Intake 2037.65 ml  Output -  Net 2037.65 ml     Sclerae unicteric  Oropharynx clear  Neck appears to be slightly swollen, more predominant on the right, with a palpable 2-3 cm mass in upper right side neck with tenderness, no other cervical or supraclavicular adenopathy  Lungs clear -- no rales or rhonchi  Heart regular rate and rhythm  Abdomen benign   CBG (last 3)  Recent Labs    04/24/18 2037 04/25/18 0722 04/25/18 1226  GLUCAP 170* 147* 100*     Labs:  Urine Studies No results for input(s): UHGB, CRYS in the last 72 hours.  Invalid input(s): UACOL, UAPR, USPG, UPH, UTP, UGL, UKET, UBIL, UNIT, UROB, ULEU, UEPI, UWBC, URBC, UBAC, CAST, UCOM, BILUA  Basic Metabolic Panel: Recent Labs  Lab 04/24/18 0930 04/25/18 0443  NA 133* 137  K 3.9 4.0  CL 97* 104  CO2 27 25  GLUCOSE 138* 151*  BUN 11 9  CREATININE 0.57 0.57  CALCIUM 8.8* 8.2*   GFR Estimated Creatinine Clearance: 70.2 mL/min (by C-G formula based  on SCr of 0.57 mg/dL). Liver Function Tests: Recent Labs  Lab 04/24/18 0930  AST 33  ALT 34  ALKPHOS 247*  BILITOT 2.1*  PROT 7.3  ALBUMIN 2.9*   No results for input(s): LIPASE, AMYLASE in the last 168 hours. No results for input(s): AMMONIA in the last 168 hours. Coagulation profile No results for input(s): INR, PROTIME in the last 168 hours.  CBC: Recent Labs  Lab 04/24/18 0930 04/25/18 0443  WBC 5.6 4.9  NEUTROABS 3.7 3.5  HGB 7.5* 6.7*  HCT 25.1* 22.1*  MCV 88.7 87.7  PLT 77* 63*   Cardiac Enzymes: No results for input(s): CKTOTAL, CKMB, CKMBINDEX, TROPONINI in the last 168 hours. BNP: Invalid input(s): POCBNP CBG: Recent Labs  Lab 04/24/18 1711 04/24/18 2037 04/25/18 0722 04/25/18 1226  GLUCAP 105* 170* 147* 100*   D-Dimer No results for input(s): DDIMER in the last 72 hours. Hgb A1c No results for input(s): HGBA1C in the last 72 hours. Lipid Profile No results for input(s): CHOL, HDL, LDLCALC, TRIG, CHOLHDL, LDLDIRECT in the last 72 hours. Thyroid function studies No results for input(s): TSH, T4TOTAL, T3FREE, THYROIDAB in the last 72 hours.  Invalid input(s): FREET3 Anemia work up No results for input(s): VITAMINB12, FOLATE, FERRITIN, TIBC, IRON, RETICCTPCT in the last 72 hours. Microbiology Recent Results (from the past 240 hour(s))  Blood  Culture (routine x 2)     Status: None (Preliminary result)   Collection Time: 04/24/18  9:30 AM  Result Value Ref Range Status   Specimen Description   Final    BLOOD RIGHT CHEST Performed at [facility] [address], [place], [place] [address]    Special Requests   Final    BOTTLES DRAWN AEROBIC AND ANAEROBIC Blood Culture adequate volume Performed at [facility] [address], [place], [place] [address]    Culture   Final    NO GROWTH < 24 HOURS Performed at [facility] [address] [place], [place] [address]    Report Status PENDING   Incomplete  Blood Culture (routine x 2)     Status: None (Preliminary result)   Collection Time: 04/24/18  9:30 AM  Result Value Ref Range Status   Specimen Description   Final    BLOOD RIGHT ANTECUBITAL Performed at [facility] [address] [place], [place] [address]    Special Requests   Final    BOTTLES DRAWN AEROBIC AND ANAEROBIC Blood Culture adequate volume Performed at [facility] [address] [place], [place] [address]    Culture   Final    NO GROWTH < 24 HOURS Performed at [facility] [address], [place], [place] [address]    Report Status PENDING  Incomplete      Studies:  Ct Soft Tissue Neck W Contrast  Result Date: 04/25/2018 CLINICAL DATA:  Fever. Recent admission for pneumonia and sepsis. Metastatic breast cancer. EXAM: CT NECK WITH CONTRAST TECHNIQUE: Multidetector CT imaging of the neck was performed using the standard protocol following the bolus administration of intravenous contrast. CONTRAST:  65m OMNIPAQUE IOHEXOL 300 MG/ML  SOLN COMPARISON:  None. FINDINGS: Pharynx and larynx: Moderately large retropharyngeal fluid collection compatible with effusion. This measures up to 10 mm in thickness. This appears to extend into the right neck where there is soft tissue edema around the right carotid artery extending into the right neck soft tissues. No pharyngeal or tonsillar abscess. Epiglottis normal Salivary glands: No inflammation, mass, or stone. There is edema surrounding the right submandibular gland which appears extrinsic to the gland. Thyroid: Negative Lymph nodes: Right lateral lymph node measuring 7.6 mm likely reactive,, with surrounding edema. Otherwise no enlarged or prominent lymph nodes. Vascular: Right jugular Port-A-Cath in good position. Jugular vein patent bilaterally. Normal vascular enhancement. Prominent soft tissue is seen surrounding the right carotid artery. This is likely inflammatory given  the retropharyngeal effusion as well as recent sepsis. Limited intracranial: No acute abnormality. Known right occipital metastatic deposit not imaged on the current study. Visualized orbits: Negative Mastoids and visualized paranasal sinuses: Mucoperiosteal thickening in the paranasal sinuses with prior sinus surgery. Orbits not imaged Skeleton: No acute skeletal abnormality. Upper chest: Right upper lobe nodular densities. Interval improvement in right upper lobe airspace disease since 04/08/2018. Residual nodule densities may be metastatic disease or pneumonia. Small bilateral pleural effusions. Other: None IMPRESSION: Retropharyngeal fluid collection compatible with effusion or possibly abscess. Soft tissue thickening extends into the right neck and surrounds the right carotid bifurcation and right internal carotid artery. There is also streaky density in the right lateral neck soft tissues with a focal 7.6 Mm lymph node in the right lateral neck. Stranding surrounds the right submandibular gland. Findings are most compatible with infection. Favor pharyngitis with retropharyngeal effusion/abscess. Soft tissue thickening around the right carotid most compatible  with infection rather than tumor. Right jugular vein is patent. Multiple nodular densities in the right upper lobe may represent residual pneumonia or metastatic disease. Interval improvement in right upper lobe infiltrate compared with CT of 04/08/2018 These results were called by telephone at the time of interpretation on 04/25/2018 at 12:36 pm to Dr. Thomes Dinning , who verbally acknowledged these results. Electronically Signed   By: Franchot Gallo M.D.   On: 04/25/2018 12:39   Dg Chest Port 1 View  Result Date: 04/24/2018 CLINICAL DATA:  Fever.  Metastatic breast carcinoma EXAM: PORTABLE CHEST 1 VIEW COMPARISON:  April 12, 2018 FINDINGS: There has been significant clearing of consolidation from the right upper lobe. A small amount of patchy opacity  with volume loss remains in this area. Atelectasis remains in the left base. No new opacity evident. Heart is upper normal in size with pulmonary vascularity normal. No adenopathy. There is aortic atherosclerosis. Port-A-Cath tip is in the superior vena cava. No pneumothorax. No bone lesions are appreciable. IMPRESSION: Significant partial clearing of consolidation from the right upper lobe. Patchy infiltrate and volume loss remains in this area. Stable left base atelectasis. No new opacity. Stable cardiac silhouette. Stable Port-A-Cath positioning. Aortic Atherosclerosis (ICD10-I70.0). Electronically Signed   By: Lowella Grip III M.D.   On: 04/24/2018 09:32    Assessment: 57 y.o. with recently diagnosed metastatic breast cancer to liver, lymph nodes and lung, status post first cycle chemotherapy about 4 weeks ago, recent hospitalization for multifocal pneumonia, presented with fever and neck edema  1. Right neck edema with adenopathy and possible retropharyngeal abscess 2. Fever, likely secondary to #1 3.  Metastatic breast cancer to liver, lung, lymph nodes and brain, ER-/PR-/HER2+, status post SBRT to brain lesion, and first cycle chemotherapy (docetaxel, Herceptin and Perjeta) 4 weeks ago 4.  Severe anemia and thrombocytopenia, secondary to bone metastasis, chemotherapy and infection 5.  Mild hyperbilirubinemia 6.  Moderate calorie and protein malnutrition 7.  Recent multifocal pneumonia and UTI, treated, improved    Plan:  -I have reviewed her CT scan images, and discussed the findings with patient.  Given the clinical presentation and CT finding, this is highly suspicious for retropharyngeal infection with possible abscess.  She is on Vanco and Zosyn -consider ENT consult  -please give blood transfusion to keep her hemoglobin above 7.0 -No need platelet transfusion unless surgery is planned, or active bleeding, or platelet count less than 15-20K  -I will add DIC panel and some anemia  work up tomorrow morning -will f/u  -I will call her sister [name] later today to update her   Thressa Sheller, MD 04/25/2018  1:56 PM

## 2018-04-25 NOTE — Progress Notes (Signed)
Asked patient about ambulating in hallway; she declined, saying she would prefer to try ambulating tomorrow instead.

## 2018-04-25 NOTE — Progress Notes (Signed)
Blood transfusing via port. Christina RN aware and will consult IV team for lab draw after BT is done.  [facility] RN IV VAST TEAM

## 2018-04-26 DIAGNOSIS — J9601 Acute respiratory failure with hypoxia: Secondary | ICD-10-CM

## 2018-04-26 DIAGNOSIS — J181 Lobar pneumonia, unspecified organism: Secondary | ICD-10-CM

## 2018-04-26 DIAGNOSIS — T451X5A Adverse effect of antineoplastic and immunosuppressive drugs, initial encounter: Secondary | ICD-10-CM

## 2018-04-26 DIAGNOSIS — R0902 Hypoxemia: Secondary | ICD-10-CM

## 2018-04-26 DIAGNOSIS — D6481 Anemia due to antineoplastic chemotherapy: Secondary | ICD-10-CM

## 2018-04-26 LAB — BASIC METABOLIC PANEL
Anion gap: 10 (ref 5–15)
BUN: 8 mg/dL (ref 6–20)
CO2: 24 mmol/L (ref 22–32)
Calcium: 8.4 mg/dL — ABNORMAL LOW (ref 8.9–10.3)
Creatinine, Ser: 0.68 mg/dL (ref 0.44–1.00)
GFR calc Af Amer: >60 mL/min (ref >60)
GFR calc non Af Amer: >60 mL/min (ref >60)
Glucose, Bld: 91 mg/dL (ref 70–99)
Potassium: 3.5 mmol/L (ref 3.5–5.1)
Sodium: 138 mmol/L (ref 135–145)

## 2018-04-26 LAB — BASIC METABOLIC PANEL WITH GFR: Chloride: 104 mmol/L (ref 98–111)

## 2018-04-26 LAB — CBC
HCT: 27.2 % — ABNORMAL LOW (ref 36.0–46.0)
Hemoglobin: 8.1 g/dL — ABNORMAL LOW (ref 12.0–15.0)
MCH: 26.7 pg (ref 26.0–34.0)
MCHC: 29.8 g/dL — ABNORMAL LOW (ref 30.0–36.0)
MCV: 89.8 fL (ref 80.0–100.0)
Platelets: 61 10*3/uL — ABNORMAL LOW (ref 150–400)
RBC: 3.03 MIL/uL — ABNORMAL LOW (ref 3.87–5.11)
RDW: 18.6 % — ABNORMAL HIGH (ref 11.5–15.5)
WBC: 6.3 10*3/uL (ref 4.0–10.5)
nRBC: 0.3 % — ABNORMAL HIGH (ref 0.0–0.2)

## 2018-04-26 LAB — GLUCOSE, CAPILLARY
Glucose-Capillary: 167 mg/dL — ABNORMAL HIGH (ref 70–99)
Glucose-Capillary: 45 mg/dL — ABNORMAL LOW (ref 70–99)
Glucose-Capillary: 72 mg/dL (ref 70–99)
Glucose-Capillary: 86 mg/dL (ref 70–99)
Glucose-Capillary: 92 mg/dL (ref 70–99)

## 2018-04-26 LAB — DIC (DISSEMINATED INTRAVASCULAR COAGULATION)PANEL
Fibrinogen: >800 mg/dL — ABNORMAL HIGH (ref 210–475)
INR: 1.3 — ABNORMAL HIGH (ref 0.8–1.2)
Platelets: 61 10*3/uL — ABNORMAL LOW (ref 150–400)
aPTT: 38 seconds — ABNORMAL HIGH (ref 24–36)

## 2018-04-26 LAB — DIC (DISSEMINATED INTRAVASCULAR COAGULATION) PANEL
D DIMER QUANT: 5.7 ug{FEU}/mL — AB (ref 0.00–0.50)
PROTHROMBIN TIME: 15.8 s — AB (ref 11.4–15.2)

## 2018-04-26 MED ORDER — ACETAMINOPHEN 325 MG PO TABS
325.0000 mg | ORAL_TABLET | Freq: Once | ORAL | Status: AC
  Administered 2018-04-27: 325 mg via ORAL
  Filled 2018-04-26: qty 1

## 2018-04-26 MED ORDER — CYCLOBENZAPRINE HCL 10 MG PO TABS
10.0000 mg | ORAL_TABLET | Freq: Three times a day (TID) | ORAL | Status: DC | PRN
  Administered 2018-04-26 – 2018-04-27 (×3): 10 mg via ORAL
  Filled 2018-04-26 (×3): qty 1

## 2018-04-26 MED ORDER — HYDROCODONE-HOMATROPINE 5-1.5 MG/5ML PO SYRP
5.0000 mL | ORAL_SOLUTION | ORAL | Status: DC | PRN
  Administered 2018-04-26 – 2018-04-27 (×2): 5 mL via ORAL
  Filled 2018-04-26 (×2): qty 5

## 2018-04-26 MED ORDER — PIPERACILLIN-TAZOBACTAM 3.375 G IVPB
3.3750 g | Freq: Three times a day (TID) | INTRAVENOUS | Status: DC
  Administered 2018-04-26 – 2018-05-03 (×20): 3.375 g via INTRAVENOUS
  Filled 2018-04-26 (×20): qty 50

## 2018-04-26 MED ORDER — VANCOMYCIN HCL IN DEXTROSE 750-5 MG/150ML-% IV SOLN
750.0000 mg | Freq: Two times a day (BID) | INTRAVENOUS | Status: DC
  Administered 2018-04-26 – 2018-04-27 (×3): 750 mg via INTRAVENOUS
  Filled 2018-04-26 (×4): qty 150

## 2018-04-26 NOTE — Progress Notes (Addendum)
Patient's cough now improved per patient.  Resp 38-40.  Denies shortness of breath - patient resting in bed.  O2 sats 95% on 2L [place].  Temp now 103.1 oral Tylenol given at 1413.  Dr. Herbert Moors notified via text page.  Called RN and stated to continue to monitor that patient will be tachypneic with a fever.  Notified of MEWS - gave order to continue with every 4 hour vitals signs and current antibiotics.  No additional orders at this time.

## 2018-04-26 NOTE — Progress Notes (Signed)
PROGRESS NOTE    Stacy Shelton  FOY:[id number] DOB: [birth year] DOA: 04/24/2018 PCP: Robyne Peers, MD  Brief Narrative:  58 year old with past medical history relevant for for metastatic breast cancer, type 2 diabetes, chronic diastolic heart failure, hypertension who was initially hospitalized from 04/07/2018 to 04/16/2018 for septic shock thought to be secondary to pneumonia who presents to the emergency department with recurrent fevers and cough and found to have retropharyngeal fluid collection with extension of inflammation around right carotid.   Assessment & Plan:   Principal Problem:   Fever Active Problems:   Uncontrolled diabetes mellitus with hyperglycemia (HCC)   Metastatic breast cancer ([facility])   Acute respiratory failure with hypoxia (HCC)   Left lower lobe pneumonia (HCC)   Chronic diastolic CHF (congestive heart failure) (HCC)   Anemia associated with chemotherapy   Chemotherapy-induced thrombocytopenia   #) Retropharyngeal fluid collection/tender cervical lymphadenopathy: Suspect this is most likely the source of her infection.  While she does not describe any dysphasia, odynophagia, changes in her voice she does report and have some right anterior cervical swelling.  This is improved this morning. - Continue IV Zosyn and vancomycin started 04/24/2018 -blood cultures ordered 04/24/2018 no growth to date -Influenza panel negative -Respiratory viral panel pending -ENT consulted, they will follow but feel like this can be treated with IV antibiotics -We will consider reimaging pending clinical course  #) COPD/asthma: -Continue PRN short-acting bronchodilators - Continue fluticasone twice daily  #) Type 2 diabetes: -Hold aspart 8 units with meals -Continue detemir 20 units nightly -Hold metformin  #) Chronic diastolic heart failure: Patient appears to be euvolemic to hypovolemic currently.  #) Hypertension: -Continue metoprolol succinate 50 mg nightly  #) Stage  IV metastatic breast cancer: -Oncology notified   Fluids: Tolerating p.o. Electrolytes: Monitor and supplement Nutrition: Carb restricted diet  Prophylaxis: Enoxaparin  Disposition: Pending resolution of fevers  Full code   Consultants:   Oncology, Dr. Alvy Bimler  ENT  Procedures:   None  Antimicrobials:   IV Zosyn and vancomycin started 04/24/2018   Subjective: This morning the patient reports that she feels better.  She reports that the anterior cervical swelling of her neck is less tender and less swollen.  She continues to have fevers.  She denies any nausea, vomiting, diarrhea.  She continues to have a chronic nonproductive cough.  Objective: Vitals:   04/25/18 2241 04/26/18 0428 04/26/18 0832 04/26/18 0840  BP: (!) 99/48 (!) 122/52  (!) 114/53  Pulse: 88 (!) 107  (!) 103  Resp: 20 20  18   Temp: 99.2 F (37.3 C) 100.2 F (37.9 C) (!) 102.4 F (39.1 C)   TempSrc: Oral Oral Oral   SpO2: 95% 96%  93%  Weight:      Height:        Intake/Output Summary (Last 24 hours) at 04/26/2018 0850 Last data filed at 04/26/2018 0300 Gross per 24 hour  Intake 970 ml  Output --  Net 970 ml   Filed Weights   04/25/18 1314  Weight: 66.3 kg    Examination:  General exam: Appears calm and comfortable  Respiratory system: No increased work of breathing, diminished lung sounds at bases, no crackles, wheezes, rhonchi Cardiovascular system: Distant heart sounds, regular rate and rhythm, no murmurs Gastrointestinal system: Soft, nondistended, no rebound or guarding, plus bowel sounds Central nervous system: Alert and oriented.  Grossly intact, moving all extremities Extremities: No lower extremity edema Skin: Right anterior cervical chain lymph node, less swollen, less tender,  less warmth Psychiatry: Judgement and insight appear normal. Mood & affect appropriate.     Data Reviewed: I have personally reviewed following labs and imaging studies  CBC: Recent Labs  Lab  04/24/18 0930 04/25/18 0443 04/26/18 0351  WBC 5.6 4.9 6.3  NEUTROABS 3.7 3.5  --   HGB 7.5* 6.7* 8.1*  HCT 25.1* 22.1* 27.2*  MCV 88.7 87.7 89.8  PLT 77* 63* 61*   61*   Basic Metabolic Panel: Recent Labs  Lab 04/24/18 0930 04/25/18 0443 04/26/18 0351  NA 133* 137 138  K 3.9 4.0 3.5  CL 97* 104 104  CO2 27 25 24   GLUCOSE 138* 151* 91  BUN 11 9 8   CREATININE 0.57 0.57 0.68  CALCIUM 8.8* 8.2* 8.4*   GFR: Estimated Creatinine Clearance: 70.2 mL/min (by C-G formula based on SCr of 0.68 mg/dL). Liver Function Tests: Recent Labs  Lab 04/24/18 0930  AST 33  ALT 34  ALKPHOS 247*  BILITOT 2.1*  PROT 7.3  ALBUMIN 2.9*   No results for input(s): LIPASE, AMYLASE in the last 168 hours. No results for input(s): AMMONIA in the last 168 hours. Coagulation Profile: Recent Labs  Lab 04/26/18 0351  INR 1.3*   Cardiac Enzymes: No results for input(s): CKTOTAL, CKMB, CKMBINDEX, TROPONINI in the last 168 hours. BNP (last 3 results) No results for input(s): PROBNP in the last 8760 hours. HbA1C: No results for input(s): HGBA1C in the last 72 hours. CBG: Recent Labs  Lab 04/25/18 0722 04/25/18 1226 04/25/18 1706 04/25/18 2025 04/26/18 0818  GLUCAP 147* 100* 153* 107* 92   Lipid Profile: No results for input(s): CHOL, HDL, LDLCALC, TRIG, CHOLHDL, LDLDIRECT in the last 72 hours. Thyroid Function Tests: No results for input(s): TSH, T4TOTAL, FREET4, T3FREE, THYROIDAB in the last 72 hours. Anemia Panel: Recent Labs    04/25/18 1816  RETICCTPCT 1.2   Sepsis Labs: Recent Labs  Lab 04/24/18 0930 04/24/18 1754  PROCALCITON  --  0.38  LATICACIDVEN 0.9 0.7    Recent Results (from the past 240 hour(s))  Blood Culture (routine x 2)     Status: None (Preliminary result)   Collection Time: 04/24/18  9:30 AM  Result Value Ref Range Status   Specimen Description   Final    BLOOD RIGHT CHEST Performed at [facility] [address] [place],  [place] [address]    Special Requests   Final    BOTTLES DRAWN AEROBIC AND ANAEROBIC Blood Culture adequate volume Performed at [facility] [address], [place], [place] [address]    Culture   Final    NO GROWTH < 24 HOURS Performed at [facility] [address], [place], [place] [address]    Report Status PENDING  Incomplete  Blood Culture (routine x 2)     Status: None (Preliminary result)   Collection Time: 04/24/18  9:30 AM  Result Value Ref Range Status   Specimen Description   Final    BLOOD RIGHT ANTECUBITAL Performed at [facility] [address], [place], [place] [address]    Special Requests   Final    BOTTLES DRAWN AEROBIC AND ANAEROBIC Blood Culture adequate volume Performed at [facility] [address], [place], [place] [address]    Culture   Final    NO GROWTH < 24 HOURS Performed at [facility] [address] [place],  [address]    Report Status PENDING  Incomplete  Respiratory Panel by PCR     Status: None   Collection Time: 04/25/18 11:52 AM  Result Value Ref Range Status   Adenovirus NOT DETECTED NOT DETECTED Final   Coronavirus 229E NOT DETECTED NOT DETECTED Final    Comment: (NOTE) The Coronavirus on the Respiratory Panel, DOES NOT test for the novel  Coronavirus (2019 nCoV)    Coronavirus HKU1 NOT DETECTED NOT DETECTED Final   Coronavirus NL63 NOT DETECTED NOT DETECTED Final   Coronavirus OC43 NOT DETECTED NOT DETECTED Final   Metapneumovirus NOT DETECTED NOT DETECTED Final   Rhinovirus / Enterovirus NOT DETECTED NOT DETECTED Final   Influenza A NOT DETECTED NOT DETECTED Final   Influenza B NOT DETECTED NOT DETECTED Final   Parainfluenza Virus 1 NOT DETECTED NOT DETECTED Final   Parainfluenza Virus 2 NOT DETECTED NOT DETECTED Final   Parainfluenza Virus 3 NOT DETECTED NOT DETECTED Final   Parainfluenza Virus 4 NOT DETECTED NOT DETECTED Final   Respiratory Syncytial  Virus NOT DETECTED NOT DETECTED Final   Bordetella pertussis NOT DETECTED NOT DETECTED Final   Chlamydophila pneumoniae NOT DETECTED NOT DETECTED Final   Mycoplasma pneumoniae NOT DETECTED NOT DETECTED Final    Comment: Performed at [facility], [address], [place], [place] [address]         Radiology Studies: Ct Soft Tissue Neck W Contrast  Result Date: 04/25/2018 CLINICAL DATA:  Fever. Recent admission for pneumonia and sepsis. Metastatic breast cancer. EXAM: CT NECK WITH CONTRAST TECHNIQUE: Multidetector CT imaging of the neck was performed using the standard protocol following the bolus administration of intravenous contrast. CONTRAST:  58mL OMNIPAQUE IOHEXOL 300 MG/ML  SOLN COMPARISON:  None. FINDINGS: Pharynx and larynx: Moderately large retropharyngeal fluid collection compatible with effusion. This measures up to 10 mm in thickness. This appears to extend into the right neck where there is soft tissue edema around the right carotid artery extending into the right neck soft tissues. No pharyngeal or tonsillar abscess. Epiglottis normal Salivary glands: No inflammation, mass, or stone. There is edema surrounding the right submandibular gland which appears extrinsic to the gland. Thyroid: Negative Lymph nodes: Right lateral lymph node measuring 7.6 mm likely reactive,, with surrounding edema. Otherwise no enlarged or prominent lymph nodes. Vascular: Right jugular Port-A-Cath in good position. Jugular vein patent bilaterally. Normal vascular enhancement. Prominent soft tissue is seen surrounding the right carotid artery. This is likely inflammatory given the retropharyngeal effusion as well as recent sepsis. Limited intracranial: No acute abnormality. Known right occipital metastatic deposit not imaged on the current study. Visualized orbits: Negative Mastoids and visualized paranasal sinuses: Mucoperiosteal thickening in the paranasal sinuses with prior sinus surgery. Orbits not imaged  Skeleton: No acute skeletal abnormality. Upper chest: Right upper lobe nodular densities. Interval improvement in right upper lobe airspace disease since 04/08/2018. Residual nodule densities may be metastatic disease or pneumonia. Small bilateral pleural effusions. Other: None IMPRESSION: Retropharyngeal fluid collection compatible with effusion or possibly abscess. Soft tissue thickening extends into the right neck and surrounds the right carotid bifurcation and right internal carotid artery. There is also streaky density in the right lateral neck soft tissues with a focal 7.6 Mm lymph node in the right lateral neck. Stranding surrounds the right submandibular gland. Findings are most compatible with infection. Favor pharyngitis with retropharyngeal effusion/abscess. Soft tissue thickening around the right carotid most compatible with infection rather than tumor. Right jugular vein is patent. Multiple nodular densities in the right upper lobe may represent residual pneumonia  or metastatic disease. Interval improvement in right upper lobe infiltrate compared with CT of 04/08/2018 These results were called by telephone at the time of interpretation on 04/25/2018 at 12:36 pm to Dr. Thomes Dinning , who verbally acknowledged these results. Electronically Signed   By: Franchot Gallo M.D.   On: 04/25/2018 12:39   Dg Chest Port 1 View  Result Date: 04/24/2018 CLINICAL DATA:  Fever.  Metastatic breast carcinoma EXAM: PORTABLE CHEST 1 VIEW COMPARISON:  April 12, 2018 FINDINGS: There has been significant clearing of consolidation from the right upper lobe. A small amount of patchy opacity with volume loss remains in this area. Atelectasis remains in the left base. No new opacity evident. Heart is upper normal in size with pulmonary vascularity normal. No adenopathy. There is aortic atherosclerosis. Port-A-Cath tip is in the superior vena cava. No pneumothorax. No bone lesions are appreciable. IMPRESSION: Significant  partial clearing of consolidation from the right upper lobe. Patchy infiltrate and volume loss remains in this area. Stable left base atelectasis. No new opacity. Stable cardiac silhouette. Stable Port-A-Cath positioning. Aortic Atherosclerosis (ICD10-I70.0). Electronically Signed   By: Lowella Grip III M.D.   On: 04/24/2018 09:32        Scheduled Meds:  sodium chloride   Intravenous Once   feeding supplement (ENSURE ENLIVE)  237 mL Oral Q24H   feeding supplement (PRO-STAT SUGAR FREE 64)  30 mL Oral Daily   insulin aspart  0-15 Units Subcutaneous TID WC   insulin aspart  0-5 Units Subcutaneous QHS   insulin detemir  20 Units Subcutaneous Daily   metoprolol succinate  50 mg Oral QPM   montelukast  10 mg Oral Daily   multivitamin with minerals  1 tablet Oral Daily   pantoprazole  40 mg Oral BID   Continuous Infusions:  sodium chloride 75 mL/hr at 04/25/18 0545   piperacillin-tazobactam (ZOSYN)  IV 3.375 g (04/26/18 0541)   vancomycin 750 mg (04/26/18 0438)     LOS: 2 days    Time spent: [place], MD [facility]  If 7PM-7AM, please contact night-coverage [email or web address] Password [facility] 04/26/2018, 8:50 AM

## 2018-04-26 NOTE — Progress Notes (Signed)
Patient c/o aches and pain all over 7/10.  States it is the bed causing her pain.  Asking for pain medication or muscle relaxer.  Dr. Herbert Moors notified via text page - returned call and gave order for Flexeril prn.

## 2018-04-26 NOTE — Progress Notes (Signed)
Robitussin prn given for cough.  Patient reports no relief.  Continues to have strong cough.  Patient's heart rate 120-130s ST with cough and chills.  Order for telemetry to expired.  Dr. Herbert Moors notified via text page.  Gave order to let telemetry order expire.  Telemetry notified.

## 2018-04-26 NOTE — Progress Notes (Signed)
Stacy Shelton   DOB:08-23-1960   WU#:[id number]   TUU#:[id number]  Oncology follow up   Subjective: Patient remains to be afebrile overnight, did not sleep much.  She was quite drowsy when I saw her around noon.  She is clinically stable, mild neck and throat pain are stable, no other new complaints.  Objective:  Vitals:   04/26/18 1103 04/26/18 1314  BP: (!) 101/49 118/81  Pulse: 98 (!) 101  Resp: 18 16  Temp: 99.8 F (37.7 C) (!) 100.8 F (38.2 C)  SpO2: 95% 95%    Body mass index is 25.89 kg/m.  Intake/Output Summary (Last 24 hours) at 04/26/2018 1346 Last data filed at 04/26/2018 0300 Gross per 24 hour  Intake 970 ml  Output -  Net 970 ml     Sclerae unicteric  Oropharynx clear  Neck appears to be slightly swollen, more predominant on the right, with a palpable 3 cm mass in upper right side neck with tenderness, unchanged from yesterday, no other cervical or supraclavicular adenopathy  Lungs clear -- no rales or rhonchi  Heart regular rate and rhythm  Abdomen benign   CBG (last 3)  Recent Labs    04/25/18 2025 04/26/18 0818 04/26/18 1216  GLUCAP 107* 92 167*     Labs:  Urine Studies No results for input(s): UHGB, CRYS in the last 72 hours.  Invalid input(s): UACOL, UAPR, USPG, UPH, UTP, UGL, UKET, UBIL, UNIT, UROB, ULEU, UEPI, UWBC, URBC, UBAC, CAST, UCOM, [place]  Basic Metabolic Panel: Recent Labs  Lab 04/24/18 0930 04/25/18 0443 04/26/18 0351  NA 133* 137 138  K 3.9 4.0 3.5  CL 97* 104 104  CO2 27 25 24   GLUCOSE 138* 151* 91  BUN 11 9 8   CREATININE 0.57 0.57 0.68  CALCIUM 8.8* 8.2* 8.4*   GFR Estimated Creatinine Clearance: 70.2 mL/min (by C-G formula based on SCr of 0.68 mg/dL). Liver Function Tests: Recent Labs  Lab 04/24/18 0930  AST 33  ALT 34  ALKPHOS 247*  BILITOT 2.1*  PROT 7.3  ALBUMIN 2.9*   No results for input(s): LIPASE, AMYLASE in the last 168 hours. No results for input(s): AMMONIA in the last 168 hours. Coagulation  profile Recent Labs  Lab 04/26/18 0351  INR 1.3*    CBC: Recent Labs  Lab 04/24/18 0930 04/25/18 0443 04/26/18 0351  WBC 5.6 4.9 6.3  NEUTROABS 3.7 3.5  --   HGB 7.5* 6.7* 8.1*  HCT 25.1* 22.1* 27.2*  MCV 88.7 87.7 89.8  PLT 77* 63* 61*  61*   Cardiac Enzymes: No results for input(s): CKTOTAL, CKMB, CKMBINDEX, TROPONINI in the last 168 hours. BNP: Invalid input(s): POCBNP CBG: Recent Labs  Lab 04/25/18 1226 04/25/18 1706 04/25/18 2025 04/26/18 0818 04/26/18 1216  GLUCAP 100* 153* 107* 92 167*   D-Dimer Recent Labs    04/26/18 0351  DDIMER 5.70*   Hgb A1c No results for input(s): HGBA1C in the last 72 hours. Lipid Profile No results for input(s): CHOL, HDL, LDLCALC, TRIG, CHOLHDL, LDLDIRECT in the last 72 hours. Thyroid function studies No results for input(s): TSH, T4TOTAL, T3FREE, THYROIDAB in the last 72 hours.  Invalid input(s): FREET3 Anemia work up Recent Labs    04/25/18 1816  RETICCTPCT 1.2   Microbiology Recent Results (from the past 240 hour(s))  Blood Culture (routine x 2)     Status: None (Preliminary result)   Collection Time: 04/24/18  9:30 AM  Result Value Ref Range Status   Specimen Description  Final    BLOOD RIGHT CHEST Performed at [facility] [address] [place], [place] [address]    Special Requests   Final    BOTTLES DRAWN AEROBIC AND ANAEROBIC Blood Culture adequate volume Performed at [facility] [address], [place], [place] [address]    Culture   Final    NO GROWTH 2 DAYS Performed at [facility] [address], [place], [place] [address]    Report Status PENDING  Incomplete  Blood Culture (routine x 2)     Status: None (Preliminary result)   Collection Time: 04/24/18  9:30 AM  Result Value Ref Range Status   Specimen Description   Final    BLOOD RIGHT ANTECUBITAL Performed at [facility] [address], [place], [place] [address]     Special Requests   Final    BOTTLES DRAWN AEROBIC AND ANAEROBIC Blood Culture adequate volume Performed at [facility] [address], [place], [place] [address]    Culture   Final    NO GROWTH 2 DAYS Performed at [facility] [address], [place], [place] [address]    Report Status PENDING  Incomplete  Respiratory Panel by PCR     Status: None   Collection Time: 04/25/18 11:52 AM  Result Value Ref Range Status   Adenovirus NOT DETECTED NOT DETECTED Final   Coronavirus 229E NOT DETECTED NOT DETECTED Final    Comment: (NOTE) The Coronavirus on the Respiratory Panel, DOES NOT test for the novel  Coronavirus (2019 nCoV)    Coronavirus HKU1 NOT DETECTED NOT DETECTED Final   Coronavirus NL63 NOT DETECTED NOT DETECTED Final   Coronavirus OC43 NOT DETECTED NOT DETECTED Final   Metapneumovirus NOT DETECTED NOT DETECTED Final   Rhinovirus / Enterovirus NOT DETECTED NOT DETECTED Final   Influenza A NOT DETECTED NOT DETECTED Final   Influenza B NOT DETECTED NOT DETECTED Final   Parainfluenza Virus 1 NOT DETECTED NOT DETECTED Final   Parainfluenza Virus 2 NOT DETECTED NOT DETECTED Final   Parainfluenza Virus 3 NOT DETECTED NOT DETECTED Final   Parainfluenza Virus 4 NOT DETECTED NOT DETECTED Final   Respiratory Syncytial Virus NOT DETECTED NOT DETECTED Final   Bordetella pertussis NOT DETECTED NOT DETECTED Final   Chlamydophila pneumoniae NOT DETECTED NOT DETECTED Final   Mycoplasma pneumoniae NOT DETECTED NOT DETECTED Final    Comment: Performed at [facility], [address] [place], [place] [address]      Studies:  Ct Soft Tissue Neck W Contrast  Result Date: 04/25/2018 CLINICAL DATA:  Fever. Recent admission for pneumonia and sepsis. Metastatic breast cancer. EXAM: CT NECK WITH CONTRAST TECHNIQUE: Multidetector CT imaging of the neck was performed using the standard protocol following the bolus administration of intravenous contrast. CONTRAST:   19m OMNIPAQUE IOHEXOL 300 MG/ML  SOLN COMPARISON:  None. FINDINGS: Pharynx and larynx: Moderately large retropharyngeal fluid collection compatible with effusion. This measures up to 10 mm in thickness. This appears to extend into the right neck where there is soft tissue edema around the right carotid artery extending into the right neck soft tissues. No pharyngeal or tonsillar abscess. Epiglottis normal Salivary glands: No inflammation, mass, or stone. There is edema surrounding the right submandibular gland which appears extrinsic to the gland. Thyroid: Negative Lymph nodes: Right lateral lymph node measuring 7.6 mm likely reactive,, with surrounding edema. Otherwise no enlarged or prominent lymph nodes. Vascular: Right jugular Port-A-Cath  in good position. Jugular vein patent bilaterally. Normal vascular enhancement. Prominent soft tissue is seen surrounding the right carotid artery. This is likely inflammatory given the retropharyngeal effusion as well as recent sepsis. Limited intracranial: No acute abnormality. Known right occipital metastatic deposit not imaged on the current study. Visualized orbits: Negative Mastoids and visualized paranasal sinuses: Mucoperiosteal thickening in the paranasal sinuses with prior sinus surgery. Orbits not imaged Skeleton: No acute skeletal abnormality. Upper chest: Right upper lobe nodular densities. Interval improvement in right upper lobe airspace disease since 04/08/2018. Residual nodule densities may be metastatic disease or pneumonia. Small bilateral pleural effusions. Other: None IMPRESSION: Retropharyngeal fluid collection compatible with effusion or possibly abscess. Soft tissue thickening extends into the right neck and surrounds the right carotid bifurcation and right internal carotid artery. There is also streaky density in the right lateral neck soft tissues with a focal 7.6 Mm lymph node in the right lateral neck. Stranding surrounds the right submandibular  gland. Findings are most compatible with infection. Favor pharyngitis with retropharyngeal effusion/abscess. Soft tissue thickening around the right carotid most compatible with infection rather than tumor. Right jugular vein is patent. Multiple nodular densities in the right upper lobe may represent residual pneumonia or metastatic disease. Interval improvement in right upper lobe infiltrate compared with CT of 04/08/2018 These results were called by telephone at the time of interpretation on 04/25/2018 at 12:36 pm to Dr. Thomes Dinning , who verbally acknowledged these results. Electronically Signed   By: Franchot Gallo M.D.   On: 04/25/2018 12:39    Assessment: 58 y.o. with recently diagnosed metastatic breast cancer to liver, lymph nodes and lung, status post first cycle chemotherapy about 4 weeks ago, recent hospitalization for multifocal pneumonia, presented with fever and neck edema  1. Right neck edema with adenopathy and possible retropharyngeal abscess 2. Fever, likely secondary to #1 3.  Metastatic breast cancer to liver, lung, lymph nodes and brain, ER-/PR-/HER2+, status post SBRT to brain lesion, and first cycle chemotherapy (docetaxel, Herceptin and Perjeta) 4 weeks ago 4.  Severe anemia and thrombocytopenia, secondary to bone metastasis, chemotherapy and infection 5.  Mild hyperbilirubinemia 6.  Moderate calorie and protein malnutrition 7.  Recent multifocal pneumonia and UTI, treated, improved    Plan:  -Patient is clinically stable, however remains to be febrile, neck edema and soft tissue mass has not decreased since yesterday, she was evaluated by ENT Dr. Benjamine Mola who recommends continue antibiotics, no surgery planned at this point. -Anemia improved after blood transfusion, thrombocytopenia stable.  DIC panel showed high fibrinogen, unlikely DIC  -will f/u, Dr. Alvy Bimler will resume care on Monday    Truitt Merle, MD 04/26/2018  1:46 PM

## 2018-04-26 NOTE — Progress Notes (Addendum)
Hypoglycemic Event  CBG: 45  Treatment: 8 oz juice/soda  Symptoms: Shaky and Nervous/irritable  Follow-up CBG: Time:2355 CBG Result:72 Juice left at bedside for pt to continue to drink. Will continue to monitor CBG   Possible Reasons for Event: Inadequate meal intake  Comments/MD notified:Bodenhiemer     Charlott Holler, Barbee Shropshire

## 2018-04-26 NOTE — Progress Notes (Signed)
Subjective: Resting comfortably in bed. No new c/o.  Objective: Vital signs in last 24 hours: Temp:  [99.2 F (37.3 C)-102.4 F (39.1 C)] 100.8 F (38.2 C) (03/28 1314) Pulse Rate:  [88-107] 101 (03/28 1314) Resp:  [16-20] 16 (03/28 1314) BP: (99-122)/(48-81) 118/81 (03/28 1314) SpO2:  [93 %-100 %] 95 % (03/28 1314)  Physical Exam Constitutional: patient awake and alert. NAD. Eyes: Pupils are equal, round, reactive to light. Extraocular motion is intact.  Ears: Examination of the ears shows normal auricles and external auditory canals bilaterally.  Nose: Nasal examination shows normal mucosa, septum, turbinates.  Face: Facial examination shows no asymmetry. Palpation of the face elicit no significant tenderness.  Mouth: Oral cavity examination shows no mucosal lacerations. No significant trismus is noted. No purulent drainage. Neck: Palpation of the neck reveals mild tenderness bilaterally. LAD noted on the right. The trachea is midline. The thyroid is not significantly enlarged.  Neuro: Cranial nerves 2-12 are all grossly in tact. Pulmonary: No stridor. No acute distress. Skin: Skin is warm.   Recent Labs    04/25/18 0443 04/26/18 0351  WBC 4.9 6.3  HGB 6.7* 8.1*  HCT 22.1* 27.2*  PLT 63* 61*  61*   Recent Labs    04/25/18 0443 04/26/18 0351  NA 137 138  K 4.0 3.5  CL 104 104  CO2 25 24  GLUCOSE 151* 91  BUN 9 8  CREATININE 0.57 0.68  CALCIUM 8.2* 8.4*    Medications:  I have reviewed the patient's current medications. Scheduled: . sodium chloride   Intravenous Once  . feeding supplement (ENSURE ENLIVE)  237 mL Oral Q24H  . feeding supplement (PRO-STAT SUGAR FREE 64)  30 mL Oral Daily  . insulin aspart  0-15 Units Subcutaneous TID WC  . insulin aspart  0-5 Units Subcutaneous QHS  . insulin detemir  20 Units Subcutaneous Daily  . metoprolol succinate  50 mg Oral QPM  . montelukast  10 mg Oral Daily  . multivitamin with minerals  1 tablet Oral Daily  .  pantoprazole  40 mg Oral BID   Continuous: . sodium chloride 75 mL/hr at 04/25/18 0545  . piperacillin-tazobactam (ZOSYN)  IV 3.375 g (04/26/18 1308)  . vancomycin 750 mg (04/26/18 0438)   SAY:TKZSWFUXNATFT, alum & mag hydroxide-simeth, cyclobenzaprine, guaiFENesin-dextromethorphan, ondansetron (ZOFRAN) IV, sodium chloride flush  Assessment/Plan: Retropharyngeal effusion in the setting of recent pneumonia and sepsis. +fever and cough. Her neck pain has improved. - Viral panels sans COVID-19 are all negative. Recommend COVID-19 testing. - CT scan reviewed. Recommend continuing IV antibiotic treatment. Retropharyngeal effusion can often be treated with medical therapy alone. - Will follow the patient while she is in the hospital.   LOS: 2 days   Stacy Shelton W Madalyne Husk 04/26/2018, 1:27 PM

## 2018-04-27 ENCOUNTER — Inpatient Hospital Stay (HOSPITAL_COMMUNITY): Payer: BLUE CROSS/BLUE SHIELD

## 2018-04-27 ENCOUNTER — Encounter (HOSPITAL_COMMUNITY): Payer: Self-pay

## 2018-04-27 DIAGNOSIS — D6959 Other secondary thrombocytopenia: Secondary | ICD-10-CM

## 2018-04-27 DIAGNOSIS — R0902 Hypoxemia: Secondary | ICD-10-CM

## 2018-04-27 LAB — BODY FLUID CELL COUNT WITH DIFFERENTIAL
Eos, Fluid: 0 %
Lymphs, Fluid: 14 %
Monocyte-Macrophage-Serous Fluid: 0 % — ABNORMAL LOW (ref 50–90)
Neutrophil Count, Fluid: 86 % — ABNORMAL HIGH (ref 0–25)
Total Nucleated Cell Count, Fluid: 12469 cu mm — ABNORMAL HIGH (ref 0–1000)

## 2018-04-27 LAB — GRAM STAIN

## 2018-04-27 LAB — CBC
HCT: 24.3 % — ABNORMAL LOW (ref 36.0–46.0)
Hemoglobin: 7.4 g/dL — ABNORMAL LOW (ref 12.0–15.0)
MCH: 26.9 pg (ref 26.0–34.0)
MCHC: 30.5 g/dL (ref 30.0–36.0)
MCV: 88.4 fL (ref 80.0–100.0)
Platelets: 46 10*3/uL — ABNORMAL LOW (ref 150–400)
RBC: 2.75 MIL/uL — ABNORMAL LOW (ref 3.87–5.11)
RDW: 19.2 % — ABNORMAL HIGH (ref 11.5–15.5)
WBC: 5.3 10*3/uL (ref 4.0–10.5)
nRBC: 0.4 % — ABNORMAL HIGH (ref 0.0–0.2)

## 2018-04-27 LAB — PROTEIN, PLEURAL OR PERITONEAL FLUID: Total protein, fluid: 3.2 g/dL

## 2018-04-27 LAB — LACTATE DEHYDROGENASE, PLEURAL OR PERITONEAL FLUID: LD, Fluid: 256 U/L — ABNORMAL HIGH (ref 3–23)

## 2018-04-27 LAB — BLOOD GAS, ARTERIAL
Acid-Base Excess: 1.9 mmol/L (ref 0.0–2.0)
Bicarbonate: 25.1 mmol/L (ref 20.0–28.0)
Drawn by: 232811
O2 Content: 15 L/min
O2 Saturation: 97 %
Patient temperature: 102.8
pCO2 arterial: 39.2 mmHg (ref 32.0–48.0)
pH, Arterial: 7.434 (ref 7.350–7.450)
pO2, Arterial: 90.7 mmHg (ref 83.0–108.0)

## 2018-04-27 LAB — GLUCOSE, CAPILLARY
Glucose-Capillary: 51 mg/dL — ABNORMAL LOW (ref 70–99)
Glucose-Capillary: 93 mg/dL (ref 70–99)
Glucose-Capillary: 71 mg/dL (ref 70–99)
Glucose-Capillary: 76 mg/dL (ref 70–99)
Glucose-Capillary: 172 mg/dL — ABNORMAL HIGH (ref 70–99)
Glucose-Capillary: 138 mg/dL — ABNORMAL HIGH (ref 70–99)

## 2018-04-27 LAB — GLUCOSE, PLEURAL OR PERITONEAL FLUID: Glucose, Fluid: 82 mg/dL

## 2018-04-27 LAB — LACTIC ACID, PLASMA
Lactic Acid, Venous: 1.8 mmol/L (ref 0.5–1.9)
Lactic Acid, Venous: 1.5 mmol/L (ref 0.5–1.9)

## 2018-04-27 MED ORDER — DEXTROSE 50 % IV SOLN
INTRAVENOUS | Status: AC
  Administered 2018-04-27: 25 mL
  Filled 2018-04-27: qty 50

## 2018-04-27 MED ORDER — IOHEXOL 350 MG/ML SOLN
100.0000 mL | Freq: Once | INTRAVENOUS | Status: AC | PRN
  Administered 2018-04-27: 100 mL via INTRAVENOUS

## 2018-04-27 MED ORDER — SODIUM CHLORIDE (PF) 0.9 % IJ SOLN
INTRAMUSCULAR | Status: AC
  Filled 2018-04-27: qty 50

## 2018-04-27 MED ORDER — SODIUM CHLORIDE 0.9 % IV BOLUS
500.0000 mL | Freq: Once | INTRAVENOUS | Status: AC
  Administered 2018-04-27: 500 mL via INTRAVENOUS

## 2018-04-27 MED ORDER — LIDOCAINE HCL 1 % IJ SOLN
INTRAMUSCULAR | Status: AC
  Filled 2018-04-27: qty 20

## 2018-04-27 NOTE — Significant Event (Signed)
Thoracentesis was attempted and only 120 mils was removed.  CTA scan of the chest was performed.  No central pulmonary embolism was noted.  Improved pneumonia in right was noted.  Large layering left pleural effusion was noted.

## 2018-04-27 NOTE — Procedures (Signed)
PROCEDURE SUMMARY:  Successful US guided diagnostic left thoracentesis. Yielded 120 mL of blood-tinged fluid. Pt experienced a mild, suspected vaso-vagal response with chills and hypotension.  Procedure was stopped with resolve of symptoms. BP 117/48. Temp 98.6. No immediate complications.  Specimen was sent for labs. CXR ordered.  EBL < 5 mL  Docia Barrier PA-C 04/27/2018 9:51 AM

## 2018-04-27 NOTE — Progress Notes (Signed)
Pt's temp 103.2 orally. MD sent message await response.

## 2018-04-27 NOTE — Progress Notes (Signed)
Pt's automatic BP 88/39, manual BP 84/36. Pt asymptomatic. On call provider made aware. New order placed for 500 cc bolus. Will carry out order and continue to monitor closely.

## 2018-04-27 NOTE — Significant Event (Signed)
Rapid Response Event Note  Overview: Rapid Response nurse to bedside to monitor pt during thoracentesis. Procedure without complications. Pt tolerated fairly well.  Wray Kearns

## 2018-04-27 NOTE — Progress Notes (Signed)
Hypoglycemic Event  CBG: 51  Treatment: 8 oz juice/soda and D50 50 mL (25 gm)  Symptoms: None  Follow-up CBG: Time:08:31 CBG Result:94  Possible Reasons for Event: Inadequate meal intake  Comments/MD notified:Dr. Purhoit     Harlow Asa, Tsosie Billing

## 2018-04-27 NOTE — Progress Notes (Signed)
Stacy Shelton   DOB:Oct 20, 1960   KZ#:[id number]   TDD#:[id number]  Oncology follow up   Subjective: Patient developed dyspnea and hypoxia, and underwent CT scan and left thoracentesis. I saw her in the afternoon, and she felt a little better.   Objective:  Vitals:   04/27/18 2000 04/27/18 2002  BP: (!) 88/39 (!) 84/36  Pulse: 97   Resp: 20   Temp: 99.6 F (37.6 C)   SpO2: 99%     Body mass index is 25.89 kg/m.  Intake/Output Summary (Last 24 hours) at 04/27/2018 2018 Last data filed at 04/27/2018 0844 Gross per 24 hour  Intake 1141.69 ml  Output 900 ml  Net 241.69 ml     Sclerae unicteric  Neck sweling improved, the palpable mass in upper right side neck appears to be slightly smaller and flatter, nontender today. no other cervical or supraclavicular adenopathy     CBG (last 3)  Recent Labs    04/27/18 1123 04/27/18 1258 04/27/18 1646  GLUCAP 71 76 172*     Labs:  Urine Studies No results for input(s): UHGB, CRYS in the last 72 hours.  Invalid input(s): UACOL, UAPR, USPG, UPH, UTP, UGL, UKET, UBIL, UNIT, UROB, ULEU, UEPI, UWBC, URBC, UBAC, CAST, UCOM, [place]  Basic Metabolic Panel: Recent Labs  Lab 04/24/18 0930 04/25/18 0443 04/26/18 0351  NA 133* 137 138  K 3.9 4.0 3.5  CL 97* 104 104  CO2 27 25 24   GLUCOSE 138* 151* 91  BUN 11 9 8   CREATININE 0.57 0.57 0.68  CALCIUM 8.8* 8.2* 8.4*   GFR Estimated Creatinine Clearance: 70.2 mL/min (by C-G formula based on SCr of 0.68 mg/dL). Liver Function Tests: Recent Labs  Lab 04/24/18 0930  AST 33  ALT 34  ALKPHOS 247*  BILITOT 2.1*  PROT 7.3  ALBUMIN 2.9*   No results for input(s): LIPASE, AMYLASE in the last 168 hours. No results for input(s): AMMONIA in the last 168 hours. Coagulation profile Recent Labs  Lab 04/26/18 0351  INR 1.3*    CBC: Recent Labs  Lab 04/24/18 0930 04/25/18 0443 04/26/18 0351 04/27/18 0011  WBC 5.6 4.9 6.3 5.3  NEUTROABS 3.7 3.5  --   --   HGB 7.5* 6.7* 8.1* 7.4*  HCT  25.1* 22.1* 27.2* 24.3*  MCV 88.7 87.7 89.8 88.4  PLT 77* 63* 61*  61* 46*   Cardiac Enzymes: No results for input(s): CKTOTAL, CKMB, CKMBINDEX, TROPONINI in the last 168 hours. BNP: Invalid input(s): POCBNP CBG: Recent Labs  Lab 04/27/18 0756 04/27/18 0831 04/27/18 1123 04/27/18 1258 04/27/18 1646  GLUCAP 51* 93 71 76 172*   D-Dimer Recent Labs    04/26/18 0351  DDIMER 5.70*   Hgb A1c No results for input(s): HGBA1C in the last 72 hours. Lipid Profile No results for input(s): CHOL, HDL, LDLCALC, TRIG, CHOLHDL, LDLDIRECT in the last 72 hours. Thyroid function studies No results for input(s): TSH, T4TOTAL, T3FREE, THYROIDAB in the last 72 hours.  Invalid input(s): FREET3 Anemia work up Recent Labs    04/25/18 1816  RETICCTPCT 1.2   Microbiology Recent Results (from the past 240 hour(s))  Blood Culture (routine x 2)     Status: None (Preliminary result)   Collection Time: 04/24/18  9:30 AM  Result Value Ref Range Status   Specimen Description   Final    BLOOD RIGHT CHEST Performed at [facility], [place] [address] [place], [place] [address]    Special Requests   Final  BOTTLES DRAWN AEROBIC AND ANAEROBIC Blood Culture adequate volume Performed at [facility] [address], [place], [place] [address]    Culture   Final    NO GROWTH 3 DAYS Performed at [facility], [place] [address] [place], [place] [address]    Report Status PENDING  Incomplete  Blood Culture (routine x 2)     Status: None (Preliminary result)   Collection Time: 04/24/18  9:30 AM  Result Value Ref Range Status   Specimen Description   Final    BLOOD RIGHT ANTECUBITAL Performed at [facility] [address], [place], [place] [address]    Special Requests   Final    BOTTLES DRAWN AEROBIC AND ANAEROBIC Blood Culture adequate volume Performed at [facility] [address] [place], [place] [address]     Culture   Final    NO GROWTH 3 DAYS Performed at [facility], [place] [address], [place], [place] [address]    Report Status PENDING  Incomplete  Respiratory Panel by PCR     Status: None   Collection Time: 04/25/18 11:52 AM  Result Value Ref Range Status   Adenovirus NOT DETECTED NOT DETECTED Final   Coronavirus 229E NOT DETECTED NOT DETECTED Final    Comment: (NOTE) The Coronavirus on the Respiratory Panel, DOES NOT test for the novel  Coronavirus (2019 nCoV)    Coronavirus HKU1 NOT DETECTED NOT DETECTED Final   Coronavirus NL63 NOT DETECTED NOT DETECTED Final   Coronavirus OC43 NOT DETECTED NOT DETECTED Final   Metapneumovirus NOT DETECTED NOT DETECTED Final   Rhinovirus / Enterovirus NOT DETECTED NOT DETECTED Final   Influenza A NOT DETECTED NOT DETECTED Final   Influenza B NOT DETECTED NOT DETECTED Final   Parainfluenza Virus 1 NOT DETECTED NOT DETECTED Final   Parainfluenza Virus 2 NOT DETECTED NOT DETECTED Final   Parainfluenza Virus 3 NOT DETECTED NOT DETECTED Final   Parainfluenza Virus 4 NOT DETECTED NOT DETECTED Final   Respiratory Syncytial Virus NOT DETECTED NOT DETECTED Final   Bordetella pertussis NOT DETECTED NOT DETECTED Final   Chlamydophila pneumoniae NOT DETECTED NOT DETECTED Final   Mycoplasma pneumoniae NOT DETECTED NOT DETECTED Final    Comment: Performed at [facility], [address], [place], [place] [address]  Gram stain     Status: None   Collection Time: 04/27/18  9:50 AM  Result Value Ref Range Status   Specimen Description PLEURAL LEFT  Final   Special Requests NONE  Final   Gram Stain   Final    MODERATE WBC PRESENT,BOTH PMN AND MONONUCLEAR NO ORGANISMS SEEN Performed at [facility], [address] [place], [place] [address]    Report Status 04/27/2018 FINAL  Final      Studies:  Ct Angio Chest Pe W Or Wo Contrast  Addendum Date: 04/27/2018   ADDENDUM REPORT: 04/27/2018 14:04 ADDENDUM: Upon further review, there is not  a large pleural effusion. There is noted complete consolidation of the left lower lobe with mild-to-moderate amount of surrounding fluid or effusion. Electronically Signed   By: Marijo Conception, M.D.   On: 04/27/2018 14:04   Result Date: 04/27/2018 CLINICAL DATA:  Interstitial lung disease.  Fever, cough. EXAM: CT ANGIOGRAPHY CHEST WITH CONTRAST TECHNIQUE: Multidetector CT imaging of the chest was performed using the standard protocol during bolus administration of intravenous contrast. Multiplanar CT image reconstructions and MIPs were obtained to evaluate the vascular  anatomy. CONTRAST:  188m OMNIPAQUE IOHEXOL 350 MG/ML SOLN COMPARISON:  CT scan of April 08, 2018. FINDINGS: Cardiovascular: No evidence of large central pulmonary embolus is noted. However, evaluation of the lower lobe branches is limited due to respiratory motion artifact. Pulmonary emboli in small peripheral branches can not be excluded. No evidence of thoracic aortic dissection or aneurysm. Mild cardiomegaly is noted. No pericardial effusion is noted. Mediastinum/Nodes: Thyroid gland and esophagus are unremarkable. Stable calcified subcarinal and right hilar adenopathy is noted. 15 mm right paratracheal lymph node is noted which is enlarged compared to prior exam. Stable 1 cm pretracheal lymph node is noted. Lungs/Pleura: No pneumothorax is noted. Large left pleural effusion is noted with atelectasis of the left lower lobe. Subsegmental atelectasis or pneumonia of the left upper lobe is noted as well. Decreased right upper and lower lobe opacities are noted suggesting improving pneumonia or atelectasis. Upper Abdomen: Stable hepatic metastases are noted. Musculoskeletal: No chest wall abnormality. No acute or significant osseous findings. Review of the MIP images confirms the above findings. IMPRESSION: No evidence of large central pulmonary embolus is noted in the main pulmonary artery or the main portions of the right and left pulmonary  arteries. However, evaluation of the lower lobe branches, particularly on the right, is limited due to respiratory motion artifact and other limiting issues. Pulmonary emboli and smaller peripheral branches can not be excluded on the basis of this exam. Large left pleural effusion is noted with complete atelectasis of the left lower lobe. Increased left upper lobe opacity is noted posteriorly concerning for atelectasis or pneumonia. Improved right upper lobe and lower lobe opacities are noted suggesting improving pneumonia or atelectasis. 15 mm right paratracheal lymph node is noted which is enlarged compared to prior exam; it is uncertain if this is metastatic or inflammatory in etiology. Hepatic metastatic lesions are again noted. Aortic Atherosclerosis (ICD10-I70.0). Electronically Signed: By: JMarijo Conception M.D. On: 04/27/2018 12:37   Dg Chest Port 1 View  Result Date: 04/27/2018 CLINICAL DATA:  Status post left thoracentesis. EXAM: PORTABLE CHEST 1 VIEW COMPARISON:  Radiograph of same day. FINDINGS: Stable cardiomediastinal silhouette. Right internal jugular Port-A-Cath is unchanged in position. Stable right upper lobe opacity is noted. Stable left basilar opacity is noted consistent with pneumonia or atelectasis and associated pleural effusion. No pneumothorax is noted. Bony thorax is unremarkable. IMPRESSION: No pneumothorax status post left-sided thoracentesis. Stable left basilar opacity is noted consistent with pneumonia or atelectasis and associated pleural effusion. Stable right upper lobe opacity is noted which may represent acute inflammation or possibly scarring. Electronically Signed   By: JMarijo Conception M.D.   On: 04/27/2018 10:36   Dg Chest Port 1 View  Result Date: 04/27/2018 CLINICAL DATA:  Hypoxia EXAM: PORTABLE CHEST 1 VIEW COMPARISON:  04/24/2018 FINDINGS: Right upper lobe opacity, reflecting residual pneumonia/post infectious scarring, improved from prior CT. Moderate layering  left pleural effusion, significantly increased from recent chest radiograph. Associated left upper lobe opacity, possibly reflecting infection and/or compressive atelectasis. No pneumothorax. The heart is top-normal in size. Right chest power port terminates in the cavoatrial junction. IMPRESSION: Moderate layering left pleural effusion, significantly increased. Associated left upper lobe opacity, possibly reflecting infection and/or compressive atelectasis. Right upper lobe opacity, reflecting residual pneumonia/post infectious scarring, improved from prior CT. Electronically Signed   By: SJulian HyM.D.   On: 04/27/2018 06:30   [place]oracentesis Asp Pleural Space W/img Guide  Result Date: 04/27/2018 INDICATION: Patient with shortness of breath, pleural effusion.  Request is made for diagnostic and therapeutic left thoracentesis. EXAM: ULTRASOUND GUIDED DIAGNOSTIC AND THERAPEUTIC LEFT THORACENTESIS MEDICATIONS: 10 mL 1% lidocaine COMPLICATIONS: None immediate. PROCEDURE: An ultrasound guided thoracentesis was thoroughly discussed with the patient and questions answered. The benefits, risks, alternatives and complications were also discussed. The patient understands and wishes to proceed with the procedure. Written consent was obtained. Ultrasound was performed to localize and mark an adequate pocket of fluid in the left chest. The area was then prepped and draped in the normal sterile fashion. 1% Lidocaine was used for local anesthesia. Under ultrasound guidance a 6 Fr Safe-T-Centesis catheter was introduced. Thoracentesis was performed. The catheter was removed and a dressing applied. FINDINGS: Pleural fluid volume by ultrasound is small. A total of approximately 120 mL of blood-tinged fluid was removed. Samples were sent to the laboratory as requested by the clinical team. IMPRESSION: Successful ultrasound guided left thoracentesis yielding 120 mL of blood-tinged of pleural fluid. Read by: Brynda Greathouse  PA-C Electronically Signed   By: Aletta Edouard M.D.   On: 04/27/2018 09:59    Assessment: 58 y.o. with recently diagnosed metastatic breast cancer to liver, lymph nodes and lung, status post first cycle chemotherapy about 4 weeks ago, recent hospitalization for multifocal pneumonia, presented with fever and neck edema  1. Right neck edema with adenopathy and possible retropharyngeal abscess 2. Fever, likely secondary to #1, resolved now  3. Hypoxia second to new left pleural effusion  4.  Metastatic breast cancer to liver, lung, lymph nodes and brain, ER-/PR-/HER2+, status post SBRT to brain lesion, and first cycle chemotherapy (docetaxel, Herceptin and Perjeta) 4 weeks ago 5.  Severe anemia and thrombocytopenia, secondary to bone metastasis, chemotherapy and infection 6.  Mild hyperbilirubinemia 7.  Moderate calorie and protein malnutrition 8.  Recent multifocal pneumonia and UTI, treated, improved    Plan:  -Patient developed new left pleural effusion, s/p thoracentesis today, lab sent, including cytology  -she has been afebrile today, neck swelling improved, infection is likely improved -Anemia and thrombocytopenia slightly worse today, I will repeat fibrinogen tomorrow morning -If her cytology is positive for malignant cells, I think she would need chemo soon. TDM1 maybe safer given her current infection  -Dr. Alvy Bimler will resume care on Monday    Truitt Merle, MD 04/27/2018  8:18 PM

## 2018-04-27 NOTE — Progress Notes (Signed)
Pt c/o SOB. O2 sats checked 83% on 3L. RN titrated O2 up for O2 sats above 92%. Pt now on 15L HFNC w/ O2 sats at 94%. On call Bodenheimer aware and orders placed. RRT RN made aware as well. Will continue to monitor.

## 2018-04-27 NOTE — Progress Notes (Signed)
PROGRESS NOTE    Stacy Shelton  QQP:[id number] DOB: 12-Feb-1960 DOA: 04/24/2018 PCP: Robyne Peers, MD  Brief Narrative:  58 year old with past medical history relevant for for metastatic breast cancer, type 2 diabetes, chronic diastolic heart failure, hypertension who was initially hospitalized from 04/07/2018 to 04/16/2018 for septic shock thought to be secondary to pneumonia who presents to the emergency department with recurrent fevers and cough and found to have retropharyngeal fluid collection with extension of inflammation around right carotid.  Her course has been complicated by acute hypoxic respiratory failure with left pleural effusion.   Assessment & Plan:   Principal Problem:   Fever Active Problems:   Uncontrolled diabetes mellitus with hyperglycemia (HCC)   Metastatic breast cancer ([place])   Acute respiratory failure with hypoxia (HCC)   Left lower lobe pneumonia (HCC)   Chronic diastolic CHF (congestive heart failure) (HCC)   Anemia associated with chemotherapy   Chemotherapy-induced thrombocytopenia  #) Acute hypoxic respiratory failure: Patient recently was treated for pneumonia.  On imaging on admission her symptoms continue to improve.  Overnight however patient became acutely hypoxic and continues to have persistent fever.  Chest x-ray shows lateral layering pleural effusion.  It is unclear at this time if patient's persistent fevers are related to the retropharyngeal fluid collection which clinically seems to be improving versus this new pleural effusion.  It is also unclear why with significant and continued improvement of her recently treated pneumonia she has now a pleural effusion. -We will discuss with interventional radiology about drainage -Would recommend sending studies for LDH, protein, culture, cell count, differential, smear, cytology, pH, glucose  #) Retropharyngeal fluid collection/tender cervical lymphadenopathy: She continues to have no dysphagia and  decreased swelling of her right neck however she has persistent fevers - Continue IV Zosyn and vancomycin started 04/24/2018 -blood cultures ordered 04/24/2018 no growth to date -Influenza panel negative -Respiratory viral panel pending -ENT consulted, they will follow but feel like this can be treated with IV antibiotics -We will consider reimaging pending clinical course  #) COPD/asthma: -Continue PRN short-acting bronchodilators - Continue fluticasone twice daily  #) Type 2 diabetes: Patient having periods of hypoglycemia likely in the setting of uncontrolled infection -Hold aspart 8 units with meals -Discontinue detemir 20 units nightly -Hold metformin  #) Chronic diastolic heart failure: Patient appears to be euvolemic to hypovolemic currently.  #) Hypertension: -Continue metoprolol succinate 50 mg nightly  #) Stage IV metastatic breast cancer: -Oncology notified   Fluids: Tolerating p.o. Electrolytes: Monitor and supplement Nutrition: Carb restricted diet  Prophylaxis: Enoxaparin  Disposition: Pending resolution of fevers and thoracentesis  Full code   Consultants:   Oncology, Dr. Alvy Bimler  ENT  Interventional radiology  Procedures:   None  Antimicrobials:   IV Zosyn and vancomycin started 04/24/2018   Subjective: This morning the patient reports that he is feeling somewhat better but continues to be short of breath.  She is profoundly hypoxic and is requiring 14 L nasal cannula.  She has cough which is chronic and unchanged.  And has had persistent fevers.  She reports that her swelling in her neck and carotidynia is improved.  Objective: Vitals:   04/27/18 0108 04/27/18 0414 04/27/18 0513 04/27/18 0521  BP: (!) 107/40 (!) 128/54    Pulse: (!) 117 (!) 123    Resp: (!) 30 (!) 26    Temp: (!) 103.1 F (39.5 C) 99.7 F (37.6 C)    TempSrc: Oral Oral    SpO2: 93% 93% Marland Kitchen)  83% 94%  Weight:      Height:        Intake/Output Summary (Last 24 hours)  at 04/27/2018 0812 Last data filed at 04/27/2018 0314 Gross per 24 hour  Intake 1876.63 ml  Output 651 ml  Net 1225.63 ml   Filed Weights   04/25/18 1314  Weight: 66.3 kg    Examination:  General exam: Appears calm and comfortable  Respiratory system: Moderately increased work of breathing, tachypneic, diminished lung sounds at bases worse on left, no crackles, rhonchi, R[place] Cardiovascular system: Distant heart sounds, tachycardic and rhythm, no murmurs Gastrointestinal system: Soft, nondistended, no rebound or guarding, plus bowel sounds Central nervous system: Alert and oriented.  Grossly intact, moving all extremities Extremities: No lower extremity edema Skin: Right anterior cervical chain lymph node, less swollen, less tender, less warmth Psychiatry: Judgement and insight appear normal. Mood & affect appropriate.     Data Reviewed: I have personally reviewed following labs and imaging studies  CBC: Recent Labs  Lab 04/24/18 0930 04/25/18 0443 04/26/18 0351 04/27/18 0011  WBC 5.6 4.9 6.3 5.3  NEUTROABS 3.7 3.5  --   --   HGB 7.5* 6.7* 8.1* 7.4*  HCT 25.1* 22.1* 27.2* 24.3*  MCV 88.7 87.7 89.8 88.4  PLT 77* 63* 61*  61* 46*   Basic Metabolic Panel: Recent Labs  Lab 04/24/18 0930 04/25/18 0443 04/26/18 0351  NA 133* 137 138  K 3.9 4.0 3.5  CL 97* 104 104  CO2 27 25 24   GLUCOSE 138* 151* 91  BUN 11 9 8   CREATININE 0.57 0.57 0.68  CALCIUM 8.8* 8.2* 8.4*   GFR: Estimated Creatinine Clearance: 70.2 mL/min (by C-G formula based on SCr of 0.68 mg/dL). Liver Function Tests: Recent Labs  Lab 04/24/18 0930  AST 33  ALT 34  ALKPHOS 247*  BILITOT 2.1*  PROT 7.3  ALBUMIN 2.9*   No results for input(s): LIPASE, AMYLASE in the last 168 hours. No results for input(s): AMMONIA in the last 168 hours. Coagulation Profile: Recent Labs  Lab 04/26/18 0351  INR 1.3*   Cardiac Enzymes: No results for input(s): CKTOTAL, CKMB, CKMBINDEX, TROPONINI in the last  168 hours. BNP (last 3 results) No results for input(s): PROBNP in the last 8760 hours. HbA1C: No results for input(s): HGBA1C in the last 72 hours. CBG: Recent Labs  Lab 04/26/18 1216 04/26/18 1631 04/26/18 2244 04/26/18 2355 04/27/18 0756  GLUCAP 167* 86 45* 72 51*   Lipid Profile: No results for input(s): CHOL, HDL, LDLCALC, TRIG, CHOLHDL, LDLDIRECT in the last 72 hours. Thyroid Function Tests: No results for input(s): TSH, T4TOTAL, FREET4, T3FREE, THYROIDAB in the last 72 hours. Anemia Panel: Recent Labs    04/25/18 1816  RETICCTPCT 1.2   Sepsis Labs: Recent Labs  Lab 04/24/18 0930 04/24/18 1754 04/27/18 0011 04/27/18 0430  PROCALCITON  --  0.38  --   --   LATICACIDVEN 0.9 0.7 1.8 1.5    Recent Results (from the past 240 hour(s))  Blood Culture (routine x 2)     Status: None (Preliminary result)   Collection Time: 04/24/18  9:30 AM  Result Value Ref Range Status   Specimen Description   Final    BLOOD RIGHT CHEST Performed at [facility] [address] [place], [place] [address]    Special Requests   Final    BOTTLES DRAWN AEROBIC AND ANAEROBIC Blood Culture adequate volume Performed at [facility] [address] [place], [place]  [address]    Culture   Final    NO GROWTH 2 DAYS Performed at [facility], [place] [address] [place], [place] [address]    Report Status PENDING  Incomplete  Blood Culture (routine x 2)     Status: None (Preliminary result)   Collection Time: 04/24/18  9:30 AM  Result Value Ref Range Status   Specimen Description   Final    BLOOD RIGHT ANTECUBITAL Performed at [facility] [address] [place], [place] [address]    Special Requests   Final    BOTTLES DRAWN AEROBIC AND ANAEROBIC Blood Culture adequate volume Performed at [facility] [address], [place], [place] [address]    Culture   Final    NO GROWTH 2 DAYS Performed at [facility] [address] [place], [place] [address]    Report Status PENDING  Incomplete  Respiratory Panel by PCR     Status: None   Collection Time: 04/25/18 11:52 AM  Result Value Ref Range Status   Adenovirus NOT DETECTED NOT DETECTED Final   Coronavirus 229E NOT DETECTED NOT DETECTED Final    Comment: (NOTE) The Coronavirus on the Respiratory Panel, DOES NOT test for the novel  Coronavirus (2019 nCoV)    Coronavirus HKU1 NOT DETECTED NOT DETECTED Final   Coronavirus NL63 NOT DETECTED NOT DETECTED Final   Coronavirus OC43 NOT DETECTED NOT DETECTED Final   Metapneumovirus NOT DETECTED NOT DETECTED Final   Rhinovirus / Enterovirus NOT DETECTED NOT DETECTED Final   Influenza A NOT DETECTED NOT DETECTED Final   Influenza B NOT DETECTED NOT DETECTED Final   Parainfluenza Virus 1 NOT DETECTED NOT DETECTED Final   Parainfluenza Virus 2 NOT DETECTED NOT DETECTED Final   Parainfluenza Virus 3 NOT DETECTED NOT DETECTED Final   Parainfluenza Virus 4 NOT DETECTED NOT DETECTED Final   Respiratory Syncytial Virus NOT DETECTED NOT DETECTED Final   Bordetella pertussis NOT DETECTED NOT DETECTED Final   Chlamydophila pneumoniae NOT DETECTED NOT DETECTED Final   Mycoplasma pneumoniae NOT DETECTED NOT DETECTED Final    Comment: Performed at [facility], [address] [place],  [address]         Radiology Studies: Ct Soft Tissue Neck W Contrast  Result Date: 04/25/2018 CLINICAL DATA:  Fever. Recent admission for pneumonia and sepsis. Metastatic breast cancer. EXAM: CT NECK WITH CONTRAST TECHNIQUE: Multidetector CT imaging of the neck was performed using the standard protocol following the bolus administration of intravenous contrast. CONTRAST:  49mL OMNIPAQUE IOHEXOL 300 MG/ML  SOLN COMPARISON:  None. FINDINGS: Pharynx and larynx: Moderately large retropharyngeal fluid collection compatible with effusion. This measures up to 10 mm in thickness. This appears to extend into  the right neck where there is soft tissue edema around the right carotid artery extending into the right neck soft tissues. No pharyngeal or tonsillar abscess. Epiglottis normal Salivary glands: No inflammation, mass, or stone. There is edema surrounding the right submandibular gland which appears extrinsic to the gland. Thyroid: Negative Lymph nodes: Right lateral lymph node measuring 7.6 mm likely reactive,, with surrounding edema. Otherwise no enlarged or prominent lymph nodes. Vascular: Right jugular Port-A-Cath in good position. Jugular vein patent bilaterally. Normal vascular enhancement. Prominent soft tissue is seen surrounding the right carotid artery. This is likely inflammatory given the retropharyngeal effusion as well as recent sepsis. Limited intracranial: No acute abnormality. Known right occipital metastatic deposit not imaged on the current study.  Visualized orbits: Negative Mastoids and visualized paranasal sinuses: Mucoperiosteal thickening in the paranasal sinuses with prior sinus surgery. Orbits not imaged Skeleton: No acute skeletal abnormality. Upper chest: Right upper lobe nodular densities. Interval improvement in right upper lobe airspace disease since 04/08/2018. Residual nodule densities may be metastatic disease or pneumonia. Small bilateral pleural effusions. Other: None IMPRESSION: Retropharyngeal fluid collection compatible with effusion or possibly abscess. Soft tissue thickening extends into the right neck and surrounds the right carotid bifurcation and right internal carotid artery. There is also streaky density in the right lateral neck soft tissues with a focal 7.6 Mm lymph node in the right lateral neck. Stranding surrounds the right submandibular gland. Findings are most compatible with infection. Favor pharyngitis with retropharyngeal effusion/abscess. Soft tissue thickening around the right carotid most compatible with infection rather than tumor. Right jugular vein is  patent. Multiple nodular densities in the right upper lobe may represent residual pneumonia or metastatic disease. Interval improvement in right upper lobe infiltrate compared with CT of 04/08/2018 These results were called by telephone at the time of interpretation on 04/25/2018 at 12:36 pm to Dr. Thomes Dinning , who verbally acknowledged these results. Electronically Signed   By: Franchot Gallo M.D.   On: 04/25/2018 12:39   Dg Chest Port 1 View  Result Date: 04/27/2018 CLINICAL DATA:  Hypoxia EXAM: PORTABLE CHEST 1 VIEW COMPARISON:  04/24/2018 FINDINGS: Right upper lobe opacity, reflecting residual pneumonia/post infectious scarring, improved from prior CT. Moderate layering left pleural effusion, significantly increased from recent chest radiograph. Associated left upper lobe opacity, possibly reflecting infection and/or compressive atelectasis. No pneumothorax. The heart is top-normal in size. Right chest power port terminates in the cavoatrial junction. IMPRESSION: Moderate layering left pleural effusion, significantly increased. Associated left upper lobe opacity, possibly reflecting infection and/or compressive atelectasis. Right upper lobe opacity, reflecting residual pneumonia/post infectious scarring, improved from prior CT. Electronically Signed   By: Julian Hy M.D.   On: 04/27/2018 06:30        Scheduled Meds: . lidocaine      . sodium chloride   Intravenous Once  . feeding supplement (ENSURE ENLIVE)  237 mL Oral Q24H  . feeding supplement (PRO-STAT SUGAR FREE 64)  30 mL Oral Daily  . insulin aspart  0-15 Units Subcutaneous TID WC  . insulin aspart  0-5 Units Subcutaneous QHS  . insulin detemir  20 Units Subcutaneous Daily  . metoprolol succinate  50 mg Oral QPM  . montelukast  10 mg Oral Daily  . multivitamin with minerals  1 tablet Oral Daily  . pantoprazole  40 mg Oral BID   Continuous Infusions: . piperacillin-tazobactam (ZOSYN)  IV 3.375 g (04/26/18 2114)  .  vancomycin 750 mg (04/26/18 2119)     LOS: 3 days    Time spent: [place], MD [facility]  If 7PM-7AM, please contact night-coverage [email or web address] Password [email or web address] 04/27/2018, 8:12 AM

## 2018-04-27 NOTE — Progress Notes (Addendum)
Pharmacy Antibiotic Note  Stacy Shelton is a 58 y.o. female with PMH metastatic breast CA admitted on 04/24/2018 with pneumonia.  Pharmacy has been consulted for Vancomycin dosing. Patient has also been placed on Cefepime per MD.   Today, 04/27/2018:  D4 vanc/cefepime  Continues to have fevers to 103F  WBC stable WNL (Active cancer on chemo)  SCr stable WNL  Trying to differentiate source between new pleural effusions (infectious vs malignant), which is worsened, and retrolaryngeal fluid collection, which is improved - thoracentesis performed 3/29  Plan:  Continue Vancomycin 750mg  IV q12h (AUC 510, SCr rounded to 0.8, IBW CrCl; used Vd 0.72 since BMI right at 30) - should result in more aggressive dosing  Plan for Vancomycin levels at steady state if indicated - will check levels tomorrow if no plans to narrow  Continue Cefepime 1g IV q8h per MD  If fevers continue (and tumor fever ruled out, may need to consider broadening therapy to include antifungal coverage (i.e. Eraxis)  Height: 5\' 3"  (160 cm) Weight: 146 lb 2.6 oz (66.3 kg) IBW/kg (Calculated) : 52.4  Temp (24hrs), Avg:101.5 F (38.6 C), Min:99 F (37.2 C), Max:103.1 F (39.5 C)  Recent Labs  Lab 04/24/18 0930 04/24/18 1754 04/25/18 0443 04/26/18 0351 04/27/18 0011 04/27/18 0430  WBC 5.6  --  4.9 6.3 5.3  --   CREATININE 0.57  --  0.57 0.68  --   --   LATICACIDVEN 0.9 0.7  --   --  1.8 1.5    Estimated Creatinine Clearance: 70.2 mL/min (by C-G formula based on SCr of 0.68 mg/dL).    Allergies  Allergen Reactions  . Nsaids Anaphylaxis    Antimicrobials this admission: 3/26 Vancomycin >> 3/26 Cefepime >>   Dose adjustments this admission:  Microbiology results: 3/26 BCx: ngtd 3/26 RVP: neg, Flu A/B neg 3/26 Strep pneumo UAg: neg 3/26 Sputum: ordered  3/29 pleural fluid: canceled, Gm stain clear   Thank you for allowing pharmacy to be a part of this patient's care.  Reuel Boom, PharmD,  BCPS [phone] 04/27/2018, 2:07 PM

## 2018-04-28 ENCOUNTER — Other Ambulatory Visit: Payer: Self-pay | Admitting: Radiation Therapy

## 2018-04-28 DIAGNOSIS — J181 Lobar pneumonia, unspecified organism: Secondary | ICD-10-CM

## 2018-04-28 DIAGNOSIS — C7951 Secondary malignant neoplasm of bone: Secondary | ICD-10-CM

## 2018-04-28 DIAGNOSIS — C787 Secondary malignant neoplasm of liver and intrahepatic bile duct: Secondary | ICD-10-CM

## 2018-04-28 DIAGNOSIS — C7931 Secondary malignant neoplasm of brain: Secondary | ICD-10-CM

## 2018-04-28 DIAGNOSIS — E878 Other disorders of electrolyte and fluid balance, not elsewhere classified: Secondary | ICD-10-CM

## 2018-04-28 DIAGNOSIS — J9601 Acute respiratory failure with hypoxia: Secondary | ICD-10-CM

## 2018-04-28 DIAGNOSIS — C50919 Malignant neoplasm of unspecified site of unspecified female breast: Secondary | ICD-10-CM

## 2018-04-28 DIAGNOSIS — Z794 Long term (current) use of insulin: Secondary | ICD-10-CM

## 2018-04-28 DIAGNOSIS — R509 Fever, unspecified: Secondary | ICD-10-CM

## 2018-04-28 DIAGNOSIS — R0902 Hypoxemia: Secondary | ICD-10-CM

## 2018-04-28 DIAGNOSIS — C7949 Secondary malignant neoplasm of other parts of nervous system: Principal | ICD-10-CM

## 2018-04-28 DIAGNOSIS — E1165 Type 2 diabetes mellitus with hyperglycemia: Secondary | ICD-10-CM

## 2018-04-28 LAB — COMPREHENSIVE METABOLIC PANEL
AST: 27 U/L (ref 15–41)
Albumin: 1.8 g/dL — ABNORMAL LOW (ref 3.5–5.0)
Alkaline Phosphatase: 162 U/L — ABNORMAL HIGH (ref 38–126)
Anion gap: 9 (ref 5–15)
BUN: 17 mg/dL (ref 6–20)
CO2: 25 mmol/L (ref 22–32)
Calcium: 7.7 mg/dL — ABNORMAL LOW (ref 8.9–10.3)
Chloride: 106 mmol/L (ref 98–111)
Creatinine, Ser: 1 mg/dL (ref 0.44–1.00)
GFR calc Af Amer: >60 mL/min (ref >60)
GFR calc non Af Amer: >60 mL/min (ref >60)
Glucose, Bld: 144 mg/dL — ABNORMAL HIGH (ref 70–99)
Potassium: 3.5 mmol/L (ref 3.5–5.1)
Total Bilirubin: 2.6 mg/dL — ABNORMAL HIGH (ref 0.3–1.2)
Total Protein: 5.5 g/dL — ABNORMAL LOW (ref 6.5–8.1)

## 2018-04-28 LAB — GLUCOSE, CAPILLARY
Glucose-Capillary: 135 mg/dL — ABNORMAL HIGH (ref 70–99)
Glucose-Capillary: 140 mg/dL — ABNORMAL HIGH (ref 70–99)
Glucose-Capillary: 156 mg/dL — ABNORMAL HIGH (ref 70–99)
Glucose-Capillary: 197 mg/dL — ABNORMAL HIGH (ref 70–99)

## 2018-04-28 LAB — CBC
HCT: 20.5 % — ABNORMAL LOW (ref 36.0–46.0)
Hemoglobin: 6.2 g/dL — CL (ref 12.0–15.0)
MCH: 27.4 pg (ref 26.0–34.0)
MCHC: 30.2 g/dL (ref 30.0–36.0)
MCV: 90.7 fL (ref 80.0–100.0)
Platelets: 26 10*3/uL — CL (ref 150–400)
RBC: 2.26 MIL/uL — ABNORMAL LOW (ref 3.87–5.11)
RDW: 19.7 % — ABNORMAL HIGH (ref 11.5–15.5)
WBC: 3.3 10*3/uL — ABNORMAL LOW (ref 4.0–10.5)
nRBC: 0 % (ref 0.0–0.2)

## 2018-04-28 LAB — PH, BODY FLUID: pH, Body Fluid: 6.8

## 2018-04-28 LAB — COMPREHENSIVE METABOLIC PANEL WITH GFR
ALT: 24 U/L (ref 0–44)
Sodium: 140 mmol/L (ref 135–145)

## 2018-04-28 LAB — HEMOGLOBIN AND HEMATOCRIT, BLOOD
HCT: 29.4 % — ABNORMAL LOW (ref 36.0–46.0)
HEMOGLOBIN: 9.2 g/dL — AB (ref 12.0–15.0)

## 2018-04-28 LAB — PREPARE RBC (CROSSMATCH)

## 2018-04-28 LAB — FIBRINOGEN: Fibrinogen: >800 mg/dL — ABNORMAL HIGH (ref 210–475)

## 2018-04-28 LAB — VANCOMYCIN, RANDOM: Vancomycin Rm: 10

## 2018-04-28 MED ORDER — VANCOMYCIN VARIABLE DOSE PER UNSTABLE RENAL FUNCTION (PHARMACIST DOSING): Status: DC

## 2018-04-28 MED ORDER — SODIUM CHLORIDE 0.9% IV SOLUTION: Freq: Once | INTRAVENOUS | Status: DC

## 2018-04-28 MED ORDER — ALUM & MAG HYDROXIDE-SIMETH 200-200-20 MG/5ML PO SUSP: 30.0000 mL | Freq: Four times a day (QID) | ORAL | Status: DC | PRN

## 2018-04-28 MED ORDER — CYCLOBENZAPRINE HCL 10 MG PO TABS
10.0000 mg | ORAL_TABLET | Freq: Two times a day (BID) | ORAL | Status: DC | PRN
  Administered 2018-04-28 – 2018-05-05 (×4): 10 mg via ORAL
  Filled 2018-04-28 (×5): qty 1

## 2018-04-28 MED ORDER — DM-GUAIFENESIN ER 30-600 MG PO TB12
1.0000 | ORAL_TABLET | Freq: Two times a day (BID) | ORAL | Status: DC
  Administered 2018-04-28 – 2018-05-04 (×12): 1 via ORAL
  Filled 2018-04-28 (×12): qty 1

## 2018-04-28 MED ORDER — HYDROCOD POLST-CPM POLST ER 10-8 MG/5ML PO SUER
5.0000 mL | Freq: Two times a day (BID) | ORAL | Status: DC
  Administered 2018-04-28 – 2018-05-06 (×16): 5 mL via ORAL
  Filled 2018-04-28 (×16): qty 5

## 2018-04-28 MED ORDER — VANCOMYCIN HCL IN DEXTROSE 1-5 GM/200ML-% IV SOLN
1000.0000 mg | INTRAVENOUS | Status: DC
  Administered 2018-04-28 – 2018-04-29 (×2): 1000 mg via INTRAVENOUS
  Filled 2018-04-28 (×2): qty 200

## 2018-04-28 MED ORDER — SODIUM CHLORIDE 0.9 % IV BOLUS
500.0000 mL | Freq: Once | INTRAVENOUS | Status: AC
  Administered 2018-04-28: 500 mL via INTRAVENOUS

## 2018-04-28 NOTE — Progress Notes (Signed)
CRITICAL VALUE ALERT  Critical Value:  Platelets 26  Date & Time Notied:  04/28/18 0550  Provider Notified: Silas [place], NP   Orders Received/Actions taken: pending

## 2018-04-28 NOTE — Progress Notes (Signed)
CRITICAL VALUE ALERT  Critical Value:  R 40-14min, 8L HFNC w O2 sat of 97 Date & Time Notied:  04/28/18 1624 Provider Notified:Dr. Posey Pronto Orders Received/Actions taken:awaiting call back

## 2018-04-28 NOTE — Consult Note (Signed)
NAME:  Stacy Shelton, MRN:  [id number], DOB:  Feb 11, 1960, LOS: 4 ADMISSION DATE:  04/24/2018, CONSULTATION DATE:  04/28/18 REFERRING MD:  Dr. Posey Pronto , CHIEF COMPLAINT:  SOB    Brief History   58 y/o F with metastatic breast cancer admitted 3/26 with reports of fever to 101.3 and cough.  She was admitted 3/9-3/18 with bilateral pneumonia and UTI.  Found to have concern for possible retropharyngeal abscess and LLL PNA with effusion.  Underwent left thoracentesis with preliminary fluid showing parapneumonic process.  Cytology pending.   History of present illness   58 y/o F with metastatic breast cancer admitted 3/26 with reports of fever to 101.3 and cough.  She was admitted 3/9-3/18 with bilateral pneumonia and UTI.  Found to have concern for possible retropharyngeal abscess and LLL PNA with effusion.  Underwent left thoracentesis with preliminary fluid showing parapneumonic process (WBC 12k, LDH 256).  Cytology pending. BP soft after thoracentesis. The patient developed worsening O2 needs and was transferred to SDU for further evaluation.  Decision to test for COVID-19, swab sent 3/20 per [facility].   States overall, she feels better now than on admit.  Reports she still has difficulty with talking for long periods of times.  Reports her neck was swollen prior to admit.  No difficulty swallowing.   Past Medical History  Metastatic breast cancer - followed by Dr. Alvy Bimler, last chemo over 1 month before admit DM II  Chronic diastolic CHF  HTN  Significant Hospital Events   3/26  Readmit with SOB, fever, cough 3/30  Tx to SDU with increased O2 needs  Consults:  PCCM 3/30   Procedures:    Significant Diagnostic Tests:  CT Soft Tissue Neck 3/27 >> retropharyngeal fluid collection compatible with effusion or possible abscess, soft tissue thickening extends into the right, stranding surrounds the right submandibular gland.  Findings most compatible with infec  Micro Data:  RVP 3/27 >>  BCx2 3/26 >>   R Pleural Fluid 3/29 >> COVID 3/30 >>   Antimicrobials:  Vanco 3/26 >>  Cefepime 3/26 >> 3/27 Zosyn 3/27 >>   Interim history/subjective:  As above  Objective   Blood pressure (!) 94/56, pulse 98, temperature 98.2 F (36.8 C), temperature source Oral, resp. rate (!) 33, height 5' 3"  (1.6 m), weight 66.3 kg, last menstrual period 10/30/2010, SpO2 95 %.        Intake/Output Summary (Last 24 hours) at 04/28/2018 1127 Last data filed at 04/28/2018 0200 Gross per 24 hour  Intake 449.94 ml  Output 300 ml  Net 149.94 ml   Filed Weights   04/25/18 1314  Weight: 66.3 kg    Examination: General: adult female lying in bed, NAD HEENT: MM pink/moist, [place] O2, jaundice / mild scleral icterus  Neuro: AAOx4, speech clear, MAE  CV: s1s2 rrr, no m/r/g PULM: even/non-labored, lungs bilaterally clear anterior, diminished lateral / bases  FX:OVAN, non-tender, bsx4 active  Extremities: warm/dry, trace ankle edema  Skin: no rashes or lesions  Resolved Hospital Problem list     Assessment & Plan:   Acute Hypoxemic Respiratory Failure  -increasing O2 need 3/30 from 2 to 6L, had similar admit recently P: Wean O2 for sats >90% COVID assessment sent 3/30 per [facility]  Follow intermittent CXR  Pulmonary hygiene -IS, mobilzie   Left Pleural Effusion  -parapneumonic vs malignant process  -WBC 12,469 / LDH 256 P: Await cultures, cytology  ABX per ID   Suspected Retropharyngeal Abscess Fever  P: ABX per  ID  Follow fever curve / WBC trend  Pancytopenia  P: Trend CBC  Transfuse per ICU guidelines  ONC following, appreciate input  SCD's only given pancytopenia  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 Message left for return call.  Disposition: SDU  Labs   CBC: Recent Labs  Lab 04/24/18 0930 04/25/18 0443 04/26/18 0351 04/27/18 0011 04/28/18 0337  WBC 5.6 4.9 6.3 5.3 3.3*  NEUTROABS 3.7 3.5  --   --   --   HGB 7.5* 6.7* 8.1* 7.4* 6.2*  HCT 25.1* 22.1* 27.2* 24.3* 20.5*  MCV 88.7 87.7 89.8 88.4 90.7  PLT 77* 63* 61*  61* 46* 26*    Basic Metabolic Panel: Recent Labs  Lab 04/24/18 0930 04/25/18 0443 04/26/18 0351 04/28/18 0337  NA 133* 137 138 140  K 3.9 4.0 3.5 3.5  CL 97* 104 104 106  CO2 27 25 24 25   GLUCOSE 138* 151* 91 144*  BUN 11 9 8 17   CREATININE 0.57 0.57 0.68 1.00  CALCIUM 8.8* 8.2* 8.4* 7.7*   GFR: Estimated Creatinine Clearance: 56.1 mL/min (by C-G formula based on SCr of 1 mg/dL). Recent Labs  Lab 04/24/18 0930 04/24/18 1754 04/25/18 0443 04/26/18 0351 04/27/18 0011 04/27/18 0430 04/28/18 0337  PROCALCITON  --  0.38  --   --   --   --   --   WBC 5.6  --  4.9 6.3 5.3  --  3.3*  LATICACIDVEN 0.9 0.7  --   --  1.8 1.5  --     Liver Function Tests: Recent Labs  Lab 04/24/18 0930 04/28/18 0337  AST 33 27  ALT 34 24  ALKPHOS 247* 162*  BILITOT 2.1* 2.6*  PROT 7.3 5.5*  ALBUMIN 2.9* 1.8*   No results for input(s): LIPASE, AMYLASE in the last 168 hours. No results for input(s): AMMONIA in the last 168 hours.  ABG    Component Value Date/Time   PHART 7.434 04/27/2018 0600   PCO2ART 39.2 04/27/2018 0600   PO2ART 90.7 04/27/2018 0600   HCO3 25.1 04/27/2018 0600   ACIDBASEDEF 6.1 (H) 04/08/2018 1547   O2SAT 97.0 04/27/2018 0600     Coagulation Profile: Recent Labs  Lab 04/26/18 0351  INR 1.3*    Cardiac Enzymes: No results for input(s): CKTOTAL, CKMB, CKMBINDEX, TROPONINI in the last 168 hours.  HbA1C: Hgb A1c MFr Bld  Date/Time Value Ref Range Status  04/08/2018 02:16 AM 7.5 (H) 4.8 - 5.6 % Final    Comment:    (NOTE) Pre diabetes:          5.7%-6.4% Diabetes:               >6.4% Glycemic control for   <7.0% adults with diabetes   03/10/2018 11:50 PM 12.9 (H) 4.8 - 5.6 % Final    Comment:    (NOTE) Pre diabetes:          5.7%-6.4% Diabetes:              >6.4% Glycemic control for   <7.0% adults with diabetes     CBG: Recent Labs  Lab 04/27/18 1258 04/27/18 1646 04/27/18 2025 04/28/18 0026 04/28/18 0815  GLUCAP 76 172* 138* 140* 135*    Review of Systems: Positives in [place]   Gen: Denies fever, chills, weight change, fatigue, night sweats HEENT: Denies blurred vision, double vision, hearing loss, tinnitus, sinus congestion, rhinorrhea, sore throat, neck stiffness, dysphagia PULM: Denies shortness of breath, cough, sputum production, hemoptysis, wheezing CV: Denies chest pain, edema, orthopnea, paroxysmal nocturnal dyspnea, palpitations GI: Denies abdominal pain, nausea, vomiting, diarrhea, hematochezia, melena, constipation, change in bowel habits GU: Denies dysuria, hematuria, polyuria, oliguria, urethral discharge Endocrine: Denies hot or cold intolerance, polyuria, polyphagia or appetite change Derm: Denies rash, dry skin, scaling or peeling skin change Heme: Denies easy bruising, bleeding, bleeding gums Neuro: Denies headache, numbness, weakness, slurred speech, loss of memory or consciousness  Past Medical History  She,  has a past medical history of Asthma, Diabetes ([place]), Metastatic breast cancer ([place]), and Seasonal allergies.   Surgical History    Past Surgical History:  Procedure Laterality Date  . IR IMAGING GUIDED PORT INSERTION  03/24/2018  . KIDNEY STONE SURGERY    . SINOSCOPY    . WISDOM TOOTH EXTRACTION       Social History   reports that she has never smoked. She has never used smokeless tobacco. She reports current alcohol use. She reports that she does not use drugs.   Family History   Her family history is negative for Allergic rhinitis, Angioedema, Asthma, Atopy, Eczema, Immunodeficiency, and Urticaria.    Allergies Allergies  Allergen Reactions  . Nsaids Anaphylaxis     Home Medications  Prior to Admission medications   Medication Sig Start Date End Date Taking? Authorizing Provider  albuterol (PROAIR HFA) 108 (90 Base) MCG/ACT inhaler Inhale 2 puffs into the lungs every 6 (six) hours as needed for wheezing or shortness of breath. 05/16/17  Yes Padgett, Rae Halsted, MD  dexamethasone (DECADRON) 4 MG tablet Take 1 tablet in the morning before chemo, none on the day of chemo and daily in the morning after chemo for 2 days, with food 03/21/18  Yes Alvy Bimler, Ni, MD  esomeprazole (NEXIUM) 40 MG capsule Take 1 capsule (40 mg total) by mouth 2 (two) times daily before a meal. 05/16/17  Yes Padgett, Rae Halsted, MD  fluticasone (FLOVENT HFA) 44 MCG/ACT inhaler Inhale 2 puffs into the lungs 2 (two) times daily. 05/16/17  Yes Padgett, Rae Halsted, MD  insulin aspart (NOVOLOG) 100 UNIT/ML FlexPen Inject 8 Units into the skin 3 (three) times daily with meals. 03/13/18  Yes Donne Hazel, MD  Insulin Detemir (LEVEMIR) 100 UNIT/ML Pen Inject 20 Units into the skin daily. 03/13/18  Yes Donne Hazel, MD  lidocaine-prilocaine (EMLA) cream Apply to affected area once Patient taking differently: Apply 1 application topically as needed (port access). Apply to affected area once 03/21/18  Yes Gorsuch, Ni, MD  lip balm (CARMEX) ointment Apply topically as needed for lip care. 04/16/18  Yes Purohit, Konrad Dolores, MD  loperamide (IMODIUM A-D) 2 MG tablet Take 1 tablet (2 mg total) by mouth 4 (four) times daily as needed for diarrhea or loose stools. 04/16/18  Yes Purohit, Konrad Dolores, MD  LORazepam (ATIVAN) 0.5 MG tablet Take 1 tab po 30 minutes prior to radiation or MRI 03/14/18  Yes Hayden Pedro, PA-C  metFORMIN (GLUCOPHAGE) 500 MG tablet Take 1 tablet (500 mg total) by mouth 2 (two) times daily with a meal. Patient taking differently: Take 500 mg by mouth daily.  03/17/18  Yes Gorsuch, Ni, MD  metoprolol  succinate (TOPROL-XL) 50 MG  24 hr tablet Take 50 mg by mouth every evening. 03/10/18  Yes [provider]  montelukast (SINGULAIR) 10 MG tablet TAKE 1 TABLET BY MOUTH EVERYDAY AT BEDTIME Patient taking differently: Take 10 mg by mouth daily.  08/12/17  Yes Kennith Gain, MD  ACCU-CHEK AVIVA PLUS test strip  03/13/18   [provider]  ACCU-CHEK SOFTCLIX LANCETS lancets  03/13/18   [provider]  blood glucose meter kit and supplies KIT Dispense based on patient and insurance preference. Use up to four times daily as directed. (FOR ICD-9 250.00, 250.01). 03/13/18   Donne Hazel, MD  Insulin Pen Needle 31G X 5 MM MISC 1 Device by Does not apply route QID. For use with insulin pens 03/13/18   Donne Hazel, MD     Critical care time: n/a    Noe Gens, NP-C [facility] Pgr: [phone] or if no answer [phone] 04/28/2018, 11:27 AM

## 2018-04-28 NOTE — Progress Notes (Addendum)
Stacy Shelton   DOB:Aug 07, 1960   YC#:[id number]    Assessment & Plan:   Metastatic breast 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 Continue aggressive supportive care CT angiogram of chest overall stable, but has enlarged paratracheal lymph node which could be metastatic disease vs. Inflammatory.  Will need additional chemo once acute illness resolved Continue supportive care for how  Recurrent fever, with hypoxia, and retropharyngeal abscess Remains febrile despite Vancomycin and Zosyn Flu and respiratory panel negative ENT following and recommends IV antibiotics.  Had thoracentesis on 3/29. 120 cc removed. Had no improvement in her breathing. Required up to 15L O2 last night, now down to 6L. Transferring to Step Down Unit today. Covid-19 testing has been ordered.  Continue oxygen support  Progressive pancytopenia Likely due to bone marrow suppression from recent chemotherapy, on the background history of bone metastasis.She also have signs of mild hemolysis, due to malignancy and chemotherapy I recommend transfusion support to keep hemoglobin greater than 8 (due to blood availability, can currently transfuse to keep >7.0); Transfuse 1 unit now. Recheck CBC 1 hour after transfusion and administer another unit if she remains <7.0. She does not need platelet transfusion unless platelet count less than 10,000 or she has signs of bleeding  Poorly controlled diabetes She will continue insulin treatment  Multiple electrolyte imbalance Replace as needed  Progressive jaundice, resolving The elevated bilirubin could be due to cholestasis or sepsis CT imaging did not show biliary obstruction Hemolysis from recent transfuse blood is also a possibility Observe only Adjust medication as needed  CODE STATUS Full code  Discharge planning Acutely ill and planned transfer to Step Down Unit.  Will need to remain hospitalized.   Mikey Bussing, NP 04/28/2018  9:12 AM   Subjective:  Spoke with patient by telephone. Covid-19 testing pending and do not have adequate PPE so patient is not examined. Still having fevers. Still having cough and shortness of breath. No improvement in breathing following thoracentesis. Was up to 15L O2 last evening. Now down to 6 L. Hospitalist transferring to Step Down Unit today. Denies bleeding.   Objective:  Vitals:   04/28/18 0534 04/28/18 0545  BP: (!) 96/44 (!) 94/56  Pulse: 99 98  Resp: (!) 28 (!) 33  Temp: 98.7 F (37.1 C) 98.2 F (36.8 C)  SpO2:  95%     Intake/Output Summary (Last 24 hours) at 04/28/2018 0912 Last data filed at 04/28/2018 0200 Gross per 24 hour  Intake 449.94 ml  Output 300 ml  Net 149.94 ml    Discussed with Dr. Posey Pronto who reported crackles to left base per his exam.   Labs:  Lab Results  Component Value Date   WBC 3.3 (L) 04/28/2018   HGB 6.2 (LL) 04/28/2018   HCT 20.5 (L) 04/28/2018   MCV 90.7 04/28/2018   PLT 26 (LL) 04/28/2018   NEUTROABS 3.5 04/25/2018    Lab Results  Component Value Date   NA 140 04/28/2018   K 3.5 04/28/2018   CL 106 04/28/2018   CO2 25 04/28/2018    Studies: I have personally reviewed her imaging studies Ct Angio Chest Pe W Or Wo Contrast  Addendum Date: 04/27/2018   ADDENDUM REPORT: 04/27/2018 14:04 ADDENDUM: Upon further review, there is not a large pleural effusion. There is noted complete consolidation of the left lower lobe with mild-to-moderate amount of surrounding fluid or effusion. Electronically Signed   By: Marijo Conception, M.D.  On: 04/27/2018 14:04   Result Date: 04/27/2018 CLINICAL DATA:  Interstitial lung disease.  Fever, cough. EXAM: CT ANGIOGRAPHY CHEST WITH CONTRAST TECHNIQUE: Multidetector CT imaging of the chest was performed using the standard protocol during bolus administration of intravenous contrast. Multiplanar CT image reconstructions and MIPs were obtained to evaluate the vascular anatomy.  CONTRAST:  177mL OMNIPAQUE IOHEXOL 350 MG/ML SOLN COMPARISON:  CT scan of April 08, 2018. FINDINGS: Cardiovascular: No evidence of large central pulmonary embolus is noted. However, evaluation of the lower lobe branches is limited due to respiratory motion artifact. Pulmonary emboli in small peripheral branches can not be excluded. No evidence of thoracic aortic dissection or aneurysm. Mild cardiomegaly is noted. No pericardial effusion is noted. Mediastinum/Nodes: Thyroid gland and esophagus are unremarkable. Stable calcified subcarinal and right hilar adenopathy is noted. 15 mm right paratracheal lymph node is noted which is enlarged compared to prior exam. Stable 1 cm pretracheal lymph node is noted. Lungs/Pleura: No pneumothorax is noted. Large left pleural effusion is noted with atelectasis of the left lower lobe. Subsegmental atelectasis or pneumonia of the left upper lobe is noted as well. Decreased right upper and lower lobe opacities are noted suggesting improving pneumonia or atelectasis. Upper Abdomen: Stable hepatic metastases are noted. Musculoskeletal: No chest wall abnormality. No acute or significant osseous findings. Review of the MIP images confirms the above findings. IMPRESSION: No evidence of large central pulmonary embolus is noted in the main pulmonary artery or the main portions of the right and left pulmonary arteries. However, evaluation of the lower lobe branches, particularly on the right, is limited due to respiratory motion artifact and other limiting issues. Pulmonary emboli and smaller peripheral branches can not be excluded on the basis of this exam. Large left pleural effusion is noted with complete atelectasis of the left lower lobe. Increased left upper lobe opacity is noted posteriorly concerning for atelectasis or pneumonia. Improved right upper lobe and lower lobe opacities are noted suggesting improving pneumonia or atelectasis. 15 mm right paratracheal lymph node is noted  which is enlarged compared to prior exam; it is uncertain if this is metastatic or inflammatory in etiology. Hepatic metastatic lesions are again noted. Aortic Atherosclerosis (ICD10-I70.0). Electronically Signed: By: Marijo Conception, M.D. On: 04/27/2018 12:37   Dg Chest Port 1 View  Result Date: 04/27/2018 CLINICAL DATA:  Status post left thoracentesis. EXAM: PORTABLE CHEST 1 VIEW COMPARISON:  Radiograph of same day. FINDINGS: Stable cardiomediastinal silhouette. Right internal jugular Port-A-Cath is unchanged in position. Stable right upper lobe opacity is noted. Stable left basilar opacity is noted consistent with pneumonia or atelectasis and associated pleural effusion. No pneumothorax is noted. Bony thorax is unremarkable. IMPRESSION: No pneumothorax status post left-sided thoracentesis. Stable left basilar opacity is noted consistent with pneumonia or atelectasis and associated pleural effusion. Stable right upper lobe opacity is noted which may represent acute inflammation or possibly scarring. Electronically Signed   By: Marijo Conception, M.D.   On: 04/27/2018 10:36   Dg Chest Port 1 View  Result Date: 04/27/2018 CLINICAL DATA:  Hypoxia EXAM: PORTABLE CHEST 1 VIEW COMPARISON:  04/24/2018 FINDINGS: Right upper lobe opacity, reflecting residual pneumonia/post infectious scarring, improved from prior CT. Moderate layering left pleural effusion, significantly increased from recent chest radiograph. Associated left upper lobe opacity, possibly reflecting infection and/or compressive atelectasis. No pneumothorax. The heart is top-normal in size. Right chest power port terminates in the cavoatrial junction. IMPRESSION: Moderate layering left pleural effusion, significantly increased. Associated left upper lobe opacity,  possibly reflecting infection and/or compressive atelectasis. Right upper lobe opacity, reflecting residual pneumonia/post infectious scarring, improved from prior CT. Electronically Signed    By: Julian Hy M.D.   On: 04/27/2018 06:30   US Thoracentesis Asp Pleural Space W/img Guide  Result Date: 04/27/2018 INDICATION: Patient with shortness of breath, pleural effusion. Request is made for diagnostic and therapeutic left thoracentesis. EXAM: ULTRASOUND GUIDED DIAGNOSTIC AND THERAPEUTIC LEFT THORACENTESIS MEDICATIONS: 10 mL 1% lidocaine COMPLICATIONS: None immediate. PROCEDURE: An ultrasound guided thoracentesis was thoroughly discussed with the patient and questions answered. The benefits, risks, alternatives and complications were also discussed. The patient understands and wishes to proceed with the procedure. Written consent was obtained. Ultrasound was performed to localize and mark an adequate pocket of fluid in the left chest. The area was then prepped and draped in the normal sterile fashion. 1% Lidocaine was used for local anesthesia. Under ultrasound guidance a 6 Fr Safe-T-Centesis catheter was introduced. Thoracentesis was performed. The catheter was removed and a dressing applied. FINDINGS: Pleural fluid volume by ultrasound is small. A total of approximately 120 mL of blood-tinged fluid was removed. Samples were sent to the laboratory as requested by the clinical team. IMPRESSION: Successful ultrasound guided left thoracentesis yielding 120 mL of blood-tinged of pleural fluid. Read by: Brynda Greathouse PA-C Electronically Signed   By: Aletta Edouard M.D.   On: 04/27/2018 09:59

## 2018-04-28 NOTE — Progress Notes (Signed)
Pharmacy Antibiotic Note  Stacy Shelton is a 58 y.o. female with PMH metastatic breast CA admitted on 04/24/2018.  Currently on D5 broad spectrum antiobiotics for retropharyngeal abscess and respiratory failure.  COVID test pending.    Today, 04/28/2018:  D5 vanc and cefepime/Zosyn  Tm 103.2  WBC stable WNL (active cancer on chemo)  SCr significantly increased  Plan:  Continue Zosyn 3.375g IV Q8H infused over 4hrs per MD.   Change to Vancomycin 1g IV q24h  Expected AUC: 450 using SCr 1  Will recheck levels and redose vancomycin as clinically indicated   Height: 5\' 3"  (160 cm) Weight: 146 lb 2.6 oz (66.3 kg) IBW/kg (Calculated) : 52.4  Temp (24hrs), Avg:100.2 F (37.9 C), Min:98.2 F (36.8 C), Max:103.2 F (39.6 C)  Recent Labs  Lab 04/24/18 0930 04/24/18 1754 04/25/18 0443 04/26/18 0351 04/27/18 0011 04/27/18 0430 04/28/18 0337  WBC 5.6  --  4.9 6.3 5.3  --  3.3*  CREATININE 0.57  --  0.57 0.68  --   --  1.00  LATICACIDVEN 0.9 0.7  --   --  1.8 1.5  --     Estimated Creatinine Clearance: 56.1 mL/min (by C-G formula based on SCr of 1 mg/dL).    Allergies  Allergen Reactions  . Nsaids Anaphylaxis    Antimicrobials this admission: 3/26 Vancomycin >> 3/26 Cefepime >> 3/27 3/27 Zosyn>>  Levels / Dose adjustments this admission: 3/30 1600 Vanc random level (17 hrs after last dose) = 10  Microbiology results: 3/26 BCx: ngtd 3/26 RVP: neg, Flu A/B neg 3/26 Strep pneumo UAg: neg 3/26 Sputum: ordered  3/29 pleural fluid: ngtd 3/30 COVID-19:     Thank you for allowing pharmacy to be a part of this patient's care.  Gretta Arab PharmD, BCPS Pager [phone] 04/28/2018 3:32 PM

## 2018-04-28 NOTE — Progress Notes (Signed)
CRITICAL VALUE ALERT  Critical Value:  Hemoglobin 6.2  Date & Time Notied:  04/28/18 0550  Provider Notified: Silas [place], NP   Orders Received/Actions taken: pending

## 2018-04-28 NOTE — Progress Notes (Addendum)
PROGRESS NOTE    Stacy Shelton  BRA:[id number] DOB: Feb 15, 1960 DOA: 04/24/2018 PCP: Robyne Peers, MD  Brief Narrative:  58 year old with past medical history relevant for for metastatic breast cancer, type 2 diabetes, chronic diastolic heart failure, hypertension who was initially hospitalized from 04/07/2018 to 04/16/2018 for septic shock thought to be secondary to pneumonia who presents to the emergency department with recurrent fevers and cough and found to have retropharyngeal fluid collection with extension of inflammation around right carotid.  Her course has been complicated by acute hypoxic respiratory failure with left pleural effusion.  Date:04/28/2018  11:03 AM  Patient Isolation: Droplet+Contact later Airborne HCP PPE: wearing a facemask wearing gown or gloves wearing eye protection Patient PPE: None   Assessment & Plan:   Principal Problem:   Fever Active Problems:   Uncontrolled diabetes mellitus with hyperglycemia ([place])   Metastatic breast cancer (HCC)   Acute respiratory failure with hypoxia (HCC)   Left lower lobe pneumonia (HCC)   Chronic diastolic CHF (congestive heart failure) (HCC)   Anemia associated with chemotherapy   Chemotherapy-induced thrombocytopenia   Hypoxia  #) Acute hypoxic respiratory failure: Patient recently was treated for pneumonia.  On imaging on admission her symptoms continue to improve.  Overnight however patient became acutely hypoxic and continues to have persistent fever.  Chest x-ray shows lateral layering pleural effusion.  It is unclear at this time if patient's persistent fevers are related to the retropharyngeal fluid collection which clinically seems to be improving versus this new pleural effusion.  It is also unclear why with significant and continued improvement of her recently treated pneumonia she has now a pleural effusion. -120 cc of fluid removed from thoracentesis. -Work-up is currently pending.  But highly suspicious for  infectious fluid.  Cultures currently pending.  Gram stain negative.  Overnight on 04/27/2018 after thoracentesis patient decompensated. Oxygenation worsened to 15 L of nasal cannula.  Also became hypotensive. Respiratory rate increased. Continues to remain febrile with a temp of 101 at 1 AM. This is despite 4 days of IV antibiotics, -3-day blood culture, negative influenza, negative respiratory virus pathogen panel. At this point the patient is deemed high risk for potential COVID.  Beginning of the morning the patient was not on any isolation, patient was initially placed on droplet contact isolation but with persistent coughing it was deemed that the patient will be at high risk for transmission and therefore airborne contact isolation was ordered. PCCM as well as ID have been given verbal information about this patient on the phone. Infection prevention is also notified. ICU charge nurse is aware of the patient as well. Discussed with ID recommend to continue current IV antibiotics. Avoiding BiPAP for now. If the patient's condition worsen she will require intubation.   #) Retropharyngeal fluid collection/tender cervical lymphadenopathy: She continues to have no dysphagia and decreased swelling of her right neck however she has persistent fevers - Continue IV Zosyn and vancomycin started 04/24/2018 -blood cultures ordered 04/24/2018 no growth to date -Influenza panel negative -Respiratory viral panel negative -ENT consulted, they will follow but feel like this can be treated with IV antibiotics -We will consider reimaging pending clinical course  #) COPD/asthma: -Continue PRN short-acting bronchodilators - Continue fluticasone twice daily  #) Type 2 diabetes: Patient having periods of hypoglycemia likely in the setting of uncontrolled infection -Hold aspart 8 units with meals -Discontinue detemir 20 units nightly -Hold metformin  #) Chronic diastolic heart failure: Patient appears  to be euvolemic to hypovolemic  currently.  #) Hypertension: Now the patient is hypotensive. Discontinue metoprolol succinate 50 mg nightly  #) Stage IV metastatic breast cancer: -Oncology notified  Pancytopenia. Patient has chronic thrombocytopenia.  Also chemotherapy-induced anemia. Now her WBC is also trending down. Again consulted is this is all in the setting of sepsis and possible COVID.  Per hematology currently transfusion support PRN for hemoglobin less than 7.  They will be monitoring the patient electronically.  Fluids: Tolerating p.o. Electrolytes: Monitor and supplement Nutrition: Carb restricted diet  Prophylaxis: Enoxaparin  Disposition: Pending resolution of fevers and thoracentesis  Full code  Addendum: Patient's respiratory condition continues to worsen.  Discussed patient's CODE STATUS. When I brought up the point for needing for ventilator given her worsening respiratory condition patient's initial response, she would not like to be intubated and placed on a ventilator. I explained her that that would mean she would be DNI at which point I would also recommend her to be DNR and that would mean that if her condition worsens and her breathing stops or her heart stops that would be the end of life. She verbalized the understanding and tells me that she is not ready to make any decision but would like to talk with her family.  She would also like to wait and make the decision when the time comes for her to be intubated. I informed her that would appreciate if she can give [place] an update sometime today regarding her goals of care but when the time will come she will not be awake enough to give any guidance.  I informed her that she is currently listed as a full code which would mean we would do everything in our hand when something happens.  Berle Mull 6:33 PM 04/28/2018     Consultants:   Oncology, Dr. Alvy Bimler  ENT  Interventional radiology  Procedures:    None  Antimicrobials:   IV Zosyn and vancomycin started 04/24/2018   Subjective: Patient become more hypoxic.  Blood pressure is also worsening. Persistently remain febrile. On further questioning what is the patient's most concern she mentions fever and cough. Denies having any pain anywhere.  Objective: Vitals:   04/28/18 0215 04/28/18 0245 04/28/18 0534 04/28/18 0545  BP: (!) 89/42 (!) 83/43 (!) 96/44 (!) 94/56  Pulse: 97 93 99 98  Resp: (!) 30 (!) 33 (!) 28 (!) 33  Temp: 99 F (37.2 C) 98.5 F (36.9 C) 98.7 F (37.1 C) 98.2 F (36.8 C)  TempSrc: Oral Oral Oral Oral  SpO2: 98% 100%  95%  Weight:      Height:        Intake/Output Summary (Last 24 hours) at 04/28/2018 1058 Last data filed at 04/28/2018 0200 Gross per 24 hour  Intake 449.94 ml  Output 300 ml  Net 149.94 ml   Filed Weights   04/25/18 1314  Weight: 66.3 kg    Examination:  General exam: Appears calm and comfortable  Respiratory system: Moderately increased work of breathing, tachypneic, diminished lung sounds at bases worse on left, no crackles, rhonchi, Rales Cardiovascular system: Distant heart sounds, tachycardic and rhythm, no murmurs Gastrointestinal system: Soft, nondistended, no rebound or guarding, plus bowel sounds Central nervous system: Alert and oriented.  Grossly intact, moving all extremities Extremities: No lower extremity edema Skin: Right anterior cervical chain lymph node, less swollen, less tender, less warmth Psychiatry: Judgement and insight appear normal. Mood & affect appropriate.     Data Reviewed: I have personally reviewed following labs and  imaging studies  CBC: Recent Labs  Lab 04/24/18 0930 04/25/18 0443 04/26/18 0351 04/27/18 0011 04/28/18 0337  WBC 5.6 4.9 6.3 5.3 3.3*  NEUTROABS 3.7 3.5  --   --   --   HGB 7.5* 6.7* 8.1* 7.4* 6.2*  HCT 25.1* 22.1* 27.2* 24.3* 20.5*  MCV 88.7 87.7 89.8 88.4 90.7  PLT 77* 63* 61*   61* 46* 26*   Basic Metabolic  Panel: Recent Labs  Lab 04/24/18 0930 04/25/18 0443 04/26/18 0351 04/28/18 0337  NA 133* 137 138 140  K 3.9 4.0 3.5 3.5  CL 97* 104 104 106  CO2 27 25 24 25   GLUCOSE 138* 151* 91 144*  BUN 11 9 8 17   CREATININE 0.57 0.57 0.68 1.00  CALCIUM 8.8* 8.2* 8.4* 7.7*   GFR: Estimated Creatinine Clearance: 56.1 mL/min (by C-G formula based on SCr of 1 mg/dL). Liver Function Tests: Recent Labs  Lab 04/24/18 0930 04/28/18 0337  AST 33 27  ALT 34 24  ALKPHOS 247* 162*  BILITOT 2.1* 2.6*  PROT 7.3 5.5*  ALBUMIN 2.9* 1.8*   No results for input(s): LIPASE, AMYLASE in the last 168 hours. No results for input(s): AMMONIA in the last 168 hours. Coagulation Profile: Recent Labs  Lab 04/26/18 0351  INR 1.3*   Cardiac Enzymes: No results for input(s): CKTOTAL, CKMB, CKMBINDEX, TROPONINI in the last 168 hours. BNP (last 3 results) No results for input(s): PROBNP in the last 8760 hours. HbA1C: No results for input(s): HGBA1C in the last 72 hours. CBG: Recent Labs  Lab 04/27/18 1258 04/27/18 1646 04/27/18 2025 04/28/18 0026 04/28/18 0815  GLUCAP 76 172* 138* 140* 135*   Lipid Profile: No results for input(s): CHOL, HDL, LDLCALC, TRIG, CHOLHDL, LDLDIRECT in the last 72 hours. Thyroid Function Tests: No results for input(s): TSH, T4TOTAL, FREET4, T3FREE, THYROIDAB in the last 72 hours. Anemia Panel: Recent Labs    04/25/18 1816  RETICCTPCT 1.2   Sepsis Labs: Recent Labs  Lab 04/24/18 0930 04/24/18 1754 04/27/18 0011 04/27/18 0430  PROCALCITON  --  0.38  --   --   LATICACIDVEN 0.9 0.7 1.8 1.5    Recent Results (from the past 240 hour(s))  Blood Culture (routine x 2)     Status: None (Preliminary result)   Collection Time: 04/24/18  9:30 AM  Result Value Ref Range Status   Specimen Description   Final    BLOOD RIGHT CHEST Performed at [facility] [address] [place], [place] [address]    Special Requests   Final    BOTTLES DRAWN  AEROBIC AND ANAEROBIC Blood Culture adequate volume Performed at [facility] [address], [place], [place] [address]    Culture   Final    NO GROWTH 3 DAYS Performed at [facility], [place] [address], [place], [place] [address]    Report Status PENDING  Incomplete  Blood Culture (routine x 2)     Status: None (Preliminary result)   Collection Time: 04/24/18  9:30 AM  Result Value Ref Range Status   Specimen Description   Final    BLOOD RIGHT ANTECUBITAL Performed at [facility] [address], [place], [place] [address]    Special Requests   Final    BOTTLES DRAWN AEROBIC AND ANAEROBIC Blood Culture adequate volume Performed at [facility] [address], [place], [place] [address]    Culture   Final    NO GROWTH 3  DAYS Performed at [facility], [place] [address] [place], [place] [address]    Report Status PENDING  Incomplete  Respiratory Panel by PCR     Status: None   Collection Time: 04/25/18 11:52 AM  Result Value Ref Range Status   Adenovirus NOT DETECTED NOT DETECTED Final   Coronavirus 229E NOT DETECTED NOT DETECTED Final    Comment: (NOTE) The Coronavirus on the Respiratory Panel, DOES NOT test for the novel  Coronavirus (2019 nCoV)    Coronavirus HKU1 NOT DETECTED NOT DETECTED Final   Coronavirus NL63 NOT DETECTED NOT DETECTED Final   Coronavirus OC43 NOT DETECTED NOT DETECTED Final   Metapneumovirus NOT DETECTED NOT DETECTED Final   Rhinovirus / Enterovirus NOT DETECTED NOT DETECTED Final   Influenza A NOT DETECTED NOT DETECTED Final   Influenza B NOT DETECTED NOT DETECTED Final   Parainfluenza Virus 1 NOT DETECTED NOT DETECTED Final   Parainfluenza Virus 2 NOT DETECTED NOT DETECTED Final   Parainfluenza Virus 3 NOT DETECTED NOT DETECTED Final   Parainfluenza Virus 4 NOT DETECTED NOT DETECTED Final   Respiratory Syncytial Virus NOT DETECTED NOT DETECTED Final   Bordetella pertussis NOT  DETECTED NOT DETECTED Final   Chlamydophila pneumoniae NOT DETECTED NOT DETECTED Final   Mycoplasma pneumoniae NOT DETECTED NOT DETECTED Final    Comment: Performed at [facility], [address] [place], [place] [address]  Gram stain     Status: None   Collection Time: 04/27/18  9:50 AM  Result Value Ref Range Status   Specimen Description PLEURAL LEFT  Final   Special Requests NONE  Final   Gram Stain   Final    MODERATE WBC PRESENT,BOTH PMN AND MONONUCLEAR NO ORGANISMS SEEN Performed at [facility], [address], [place], [place] [address]    Report Status 04/27/2018 FINAL  Final         Radiology Studies: Ct Angio Chest Pe W Or Wo Contrast  Addendum Date: 04/27/2018   ADDENDUM REPORT: 04/27/2018 14:04 ADDENDUM: Upon further review, there is not a large pleural effusion. There is noted complete consolidation of the left lower lobe with mild-to-moderate amount of surrounding fluid or effusion. Electronically Signed   By: Marijo Conception, M.D.   On: 04/27/2018 14:04   Result Date: 04/27/2018 CLINICAL DATA:  Interstitial lung disease.  Fever, cough. EXAM: CT ANGIOGRAPHY CHEST WITH CONTRAST TECHNIQUE: Multidetector CT imaging of the chest was performed using the standard protocol during bolus administration of intravenous contrast. Multiplanar CT image reconstructions and MIPs were obtained to evaluate the vascular anatomy. CONTRAST:  194mL OMNIPAQUE IOHEXOL 350 MG/ML SOLN COMPARISON:  CT scan of April 08, 2018. FINDINGS: Cardiovascular: No evidence of large central pulmonary embolus is noted. However, evaluation of the lower lobe branches is limited due to respiratory motion artifact. Pulmonary emboli in small peripheral branches can not be excluded. No evidence of thoracic aortic dissection or aneurysm. Mild cardiomegaly is noted. No pericardial effusion is noted. Mediastinum/Nodes: Thyroid gland and esophagus are unremarkable. Stable calcified subcarinal and right hilar  adenopathy is noted. 15 mm right paratracheal lymph node is noted which is enlarged compared to prior exam. Stable 1 cm pretracheal lymph node is noted. Lungs/Pleura: No pneumothorax is noted. Large left pleural effusion is noted with atelectasis of the left lower lobe. Subsegmental atelectasis or pneumonia of the left upper lobe is noted as well. Decreased right upper and lower lobe opacities are noted suggesting improving pneumonia or atelectasis. Upper  Abdomen: Stable hepatic metastases are noted. Musculoskeletal: No chest wall abnormality. No acute or significant osseous findings. Review of the MIP images confirms the above findings. IMPRESSION: No evidence of large central pulmonary embolus is noted in the main pulmonary artery or the main portions of the right and left pulmonary arteries. However, evaluation of the lower lobe branches, particularly on the right, is limited due to respiratory motion artifact and other limiting issues. Pulmonary emboli and smaller peripheral branches can not be excluded on the basis of this exam. Large left pleural effusion is noted with complete atelectasis of the left lower lobe. Increased left upper lobe opacity is noted posteriorly concerning for atelectasis or pneumonia. Improved right upper lobe and lower lobe opacities are noted suggesting improving pneumonia or atelectasis. 15 mm right paratracheal lymph node is noted which is enlarged compared to prior exam; it is uncertain if this is metastatic or inflammatory in etiology. Hepatic metastatic lesions are again noted. Aortic Atherosclerosis (ICD10-I70.0). Electronically Signed: By: Marijo Conception, M.D. On: 04/27/2018 12:37   Dg Chest Port 1 View  Result Date: 04/27/2018 CLINICAL DATA:  Status post left thoracentesis. EXAM: PORTABLE CHEST 1 VIEW COMPARISON:  Radiograph of same day. FINDINGS: Stable cardiomediastinal silhouette. Right internal jugular Port-A-Cath is unchanged in position. Stable right upper lobe  opacity is noted. Stable left basilar opacity is noted consistent with pneumonia or atelectasis and associated pleural effusion. No pneumothorax is noted. Bony thorax is unremarkable. IMPRESSION: No pneumothorax status post left-sided thoracentesis. Stable left basilar opacity is noted consistent with pneumonia or atelectasis and associated pleural effusion. Stable right upper lobe opacity is noted which may represent acute inflammation or possibly scarring. Electronically Signed   By: Marijo Conception, M.D.   On: 04/27/2018 10:36   Dg Chest Port 1 View  Result Date: 04/27/2018 CLINICAL DATA:  Hypoxia EXAM: PORTABLE CHEST 1 VIEW COMPARISON:  04/24/2018 FINDINGS: Right upper lobe opacity, reflecting residual pneumonia/post infectious scarring, improved from prior CT. Moderate layering left pleural effusion, significantly increased from recent chest radiograph. Associated left upper lobe opacity, possibly reflecting infection and/or compressive atelectasis. No pneumothorax. The heart is top-normal in size. Right chest power port terminates in the cavoatrial junction. IMPRESSION: Moderate layering left pleural effusion, significantly increased. Associated left upper lobe opacity, possibly reflecting infection and/or compressive atelectasis. Right upper lobe opacity, reflecting residual pneumonia/post infectious scarring, improved from prior CT. Electronically Signed   By: Julian Hy M.D.   On: 04/27/2018 06:30   US Thoracentesis Asp Pleural Space W/img Guide  Result Date: 04/27/2018 INDICATION: Patient with shortness of breath, pleural effusion. Request is made for diagnostic and therapeutic left thoracentesis. EXAM: ULTRASOUND GUIDED DIAGNOSTIC AND THERAPEUTIC LEFT THORACENTESIS MEDICATIONS: 10 mL 1% lidocaine COMPLICATIONS: None immediate. PROCEDURE: An ultrasound guided thoracentesis was thoroughly discussed with the patient and questions answered. The benefits, risks, alternatives and complications  were also discussed. The patient understands and wishes to proceed with the procedure. Written consent was obtained. Ultrasound was performed to localize and mark an adequate pocket of fluid in the left chest. The area was then prepped and draped in the normal sterile fashion. 1% Lidocaine was used for local anesthesia. Under ultrasound guidance a 6 Fr Safe-T-Centesis catheter was introduced. Thoracentesis was performed. The catheter was removed and a dressing applied. FINDINGS: Pleural fluid volume by ultrasound is small. A total of approximately 120 mL of blood-tinged fluid was removed. Samples were sent to the laboratory as requested by the clinical team. IMPRESSION: Successful ultrasound guided left  thoracentesis yielding 120 mL of blood-tinged of pleural fluid. Read by: Brynda Greathouse PA-C Electronically Signed   By: Aletta Edouard M.D.   On: 04/27/2018 09:59        Scheduled Meds:  sodium chloride   Intravenous Once   sodium chloride   Intravenous Once   feeding supplement (ENSURE ENLIVE)  237 mL Oral Q24H   feeding supplement (PRO-STAT SUGAR FREE 64)  30 mL Oral Daily   insulin aspart  0-15 Units Subcutaneous TID WC   insulin aspart  0-5 Units Subcutaneous QHS   montelukast  10 mg Oral Daily   multivitamin with minerals  1 tablet Oral Daily   pantoprazole  40 mg Oral BID   Continuous Infusions:  piperacillin-tazobactam (ZOSYN)  IV 3.375 g (04/28/18 0913)     LOS: 4 days    Time spent: [place], MD [facility]  If 7PM-7AM, please contact night-coverage [email or web address] Password [email or web address] 04/28/2018, 10:58 AM

## 2018-04-28 NOTE — Progress Notes (Signed)
Subjective: Neck pain and neck swelling have improved. Breathing and O2 saturation have worsened.  Objective: Vital signs in last 24 hours: Temp:  [98.2 F (36.8 C)-103.2 F (39.6 C)] 99.9 F (37.7 C) (03/30 1218) Pulse Rate:  [91-111] 100 (03/30 1400) Resp:  [20-51] 50 (03/30 1400) BP: (83-146)/(36-56) 90/41 (03/30 1400) SpO2:  [95 %-100 %] 100 % (03/30 1400)  Physical Exam Constitutional: patient awake and alert. NAD. Eyes:Pupils are equal, round, reactive to light. Extraocular motion is intact.  Ears: Examination of the ears shows normal auricles and external auditory canals bilaterally.  Nose: Nasal examination shows normal mucosa, septum, turbinates.  Face: Facial examination shows no asymmetry. Palpation of the face elicit no significant tenderness.  Mouth: Oral cavity examination shows no mucosal abnormality. No significant trismus is noted.No purulent drainage. Neck:Palpation of the neck reveals mild tenderness bilaterally. LAD decreased on the right.The trachea is midline. The thyroid is not significantly enlarged.  Neuro: Cranial nerves 2-12 are all grossly in tact. Pulmonary:No stridor. No acute distress. Skin: Skin is warm.   Recent Labs    04/27/18 0011 04/28/18 0337  WBC 5.3 3.3*  HGB 7.4* 6.2*  HCT 24.3* 20.5*  PLT 46* 26*   Recent Labs    04/26/18 0351 04/28/18 0337  NA 138 140  K 3.5 3.5  CL 104 106  CO2 24 25  GLUCOSE 91 144*  BUN 8 17  CREATININE 0.68 1.00  CALCIUM 8.4* 7.7*    Medications:  I have reviewed the patient's current medications. Scheduled: . sodium chloride   Intravenous Once  . feeding supplement (ENSURE ENLIVE)  237 mL Oral Q24H  . feeding supplement (PRO-STAT SUGAR FREE 64)  30 mL Oral Daily  . insulin aspart  0-15 Units Subcutaneous TID WC  . insulin aspart  0-5 Units Subcutaneous QHS  . montelukast  10 mg Oral Daily  . multivitamin with minerals  1 tablet Oral Daily  . pantoprazole  40 mg Oral BID  . vancomycin  variable dose per unstable renal function (pharmacist dosing)   Does not apply See admin instructions   Continuous: . piperacillin-tazobactam (ZOSYN)  IV 3.375 g (04/28/18 0913)    Assessment/Plan: Retropharyngeal effusion in the setting of recent pneumonia and sepsis. +fever and cough. Her neck pain and swelling continues to improve. - Viral panels sans COVID-19 are all negative. Recommend stat COVID-19 testing. - Recommend continuing IV antibiotic treatment. Retropharyngeal effusion can often be treated with medical therapy alone. - Other treatment per ID and pulmonary.   LOS: 4 days   Jaretzi Droz W Nissa Stannard 04/28/2018, 3:08 PM

## 2018-04-28 NOTE — Consult Note (Addendum)
[facility]    Date of Admission:  04/24/2018   Total days of antibiotics: 3 vanco/zosyn               Reason for Consult: Respiratory Failure    Referring Provider: Posey Pronto   Assessment: Retropharyngeal abscess Breast Cancer Respiratory failure  Plan: 1. COVID testing 2. Continue vanco/zosyn for her abscess 3. Could consider repeating her CT neck  Comment- Her worseining respiratory failure is concerning for COVID. Hopefully her testing can be done urgently. Her CT does not appear to be consistent with diffuse ground glass as is commonly seen with viral pna.  She did not cough while we were in room or appear stridorous.   Thank you so much for this interesting consult,  Principal Problem:   Fever Active Problems:   Uncontrolled diabetes mellitus with hyperglycemia (HCC)   Metastatic breast cancer (HCC)   Acute respiratory failure with hypoxia (HCC)   Left lower lobe pneumonia (HCC)   Chronic diastolic CHF (congestive heart failure) (HCC)   Anemia associated with chemotherapy   Chemotherapy-induced thrombocytopenia   Hypoxia    sodium chloride   Intravenous Once   feeding supplement (ENSURE ENLIVE)  237 mL Oral Q24H   feeding supplement (PRO-STAT SUGAR FREE 64)  30 mL Oral Daily   insulin aspart  0-15 Units Subcutaneous TID WC   insulin aspart  0-5 Units Subcutaneous QHS   montelukast  10 mg Oral Daily   multivitamin with minerals  1 tablet Oral Daily   pantoprazole  40 mg Oral BID    HPI: MAKENNA MACALUSO is a 58 y.o. female with hx of metastatic breast cancer, CHF, DM, who was recently in hospital earlier this month and treated as CHF and HCAP. She was not felt to be at risk for COVID as she had no known exposures and only traveled back and forth to chemotherapy.   She was in ICU during this period, nearly intubated.  She returned to hospital on 3-26 with fever 101.3, worsening cough, SOB.  She has had CTX once since d/c-  3-27. Otherwise she has not left the house. Her only visitors have been family (lives with mother and sister).  She was seen by PCP for a swollen LN on R neck. She was given oral anbx and felt that this resolved.  In Ed her WBC was nl,   Her CXR was: Significant partial clearing of consolidation from the right upper lobe. Patchy infiltrate and volume loss remains in this area. Stable left base atelectasis. No new opacity. Stable cardiac silhouette. She also underwent CT neck: Retropharyngeal fluid collection compatible with effusion or possibly abscess. Soft tissue thickening extends into the right neck and surrounds the right carotid bifurcation and right internal carotid artery. There is also streaky density in the right lateral neck soft tissues with a focal 7.6 Mm lymph node in the right lateral neck. Stranding surrounds the right submandibular gland.  In hospital she has had pleural effusion (htoracentesis 3-29, consistent with empyema, Cx pending), she has had worsening oxygenation over last 24h as well as hypotension.  Her fever has persisted.   She had repeat CT yesterday: noted complete consolidation of the left lower lobe with mild-to-moderate amount of surrounding fluid or effusion.  Review of Systems: Review of Systems  Constitutional: Positive for fever.  HENT: Negative for sore throat.   Respiratory: Positive for cough and shortness of breath.   Gastrointestinal: Negative  for constipation and diarrhea.  Genitourinary: Negative for dysuria.  Please see HPI. All other systems reviewed and negative.   Past Medical History:  Diagnosis Date   Asthma    Diabetes ([place])    Metastatic breast cancer ([place])    Seasonal allergies     Social History   Tobacco Use   Smoking status: Never Smoker   Smokeless tobacco: Never Used  Substance Use Topics   Alcohol use: Yes    Alcohol/week: 0.0 standard drinks    Comment: socially   Drug use: No    Family History  Problem  Relation Age of Onset   Allergic rhinitis Neg Hx    Angioedema Neg Hx    Asthma Neg Hx    Atopy Neg Hx    Eczema Neg Hx    Immunodeficiency Neg Hx    Urticaria Neg Hx      Medications:  Scheduled:  sodium chloride   Intravenous Once   feeding supplement (ENSURE ENLIVE)  237 mL Oral Q24H   feeding supplement (PRO-STAT SUGAR FREE 64)  30 mL Oral Daily   insulin aspart  0-15 Units Subcutaneous TID WC   insulin aspart  0-5 Units Subcutaneous QHS   montelukast  10 mg Oral Daily   multivitamin with minerals  1 tablet Oral Daily   pantoprazole  40 mg Oral BID    Abtx:  Anti-infectives (From admission, onward)   Start     Dose/Rate Route Frequency Ordered Stop   04/26/18 2200  piperacillin-tazobactam (ZOSYN) IVPB 3.375 g     3.375 g 12.5 mL/hr over 240 Minutes Intravenous Every 8 hours 04/26/18 1602     04/26/18 2200  vancomycin (VANCOCIN) IVPB 750 mg/150 ml premix  Status:  Discontinued     750 mg 150 mL/hr over 60 Minutes Intravenous 2 times daily 04/26/18 1602 04/28/18 0914   04/25/18 2000  piperacillin-tazobactam (ZOSYN) IVPB 3.375 g  Status:  Discontinued     3.375 g 12.5 mL/hr over 240 Minutes Intravenous Every 8 hours 04/25/18 1239 04/26/18 1602   04/25/18 1245  piperacillin-tazobactam (ZOSYN) IVPB 3.375 g  Status:  Discontinued     3.375 g 100 mL/hr over 30 Minutes Intravenous Every 6 hours 04/25/18 1236 04/25/18 1237   04/24/18 1800  ceFEPIme (MAXIPIME) 1 g in sodium chloride 0.9 % 100 mL IVPB  Status:  Discontinued     1 g 200 mL/hr over 30 Minutes Intravenous Every 8 hours 04/24/18 1504 04/25/18 1236   04/24/18 1800  vancomycin (VANCOCIN) IVPB 750 mg/150 ml premix  Status:  Discontinued     750 mg 150 mL/hr over 60 Minutes Intravenous Every 12 hours 04/24/18 1525 04/26/18 1602   04/24/18 0915  vancomycin (VANCOCIN) IVPB 1000 mg/200 mL premix     1,000 mg 200 mL/hr over 60 Minutes Intravenous  Once 04/24/18 0904 04/24/18 1129   04/24/18 0915  ceFEPIme  (MAXIPIME) 2 g in sodium chloride 0.9 % 100 mL IVPB     2 g 200 mL/hr over 30 Minutes Intravenous  Once 04/24/18 0904 04/24/18 1024        OBJECTIVE: Blood pressure (!) 102/39, pulse 100, temperature 100.3 F (37.9 C), temperature source Oral, resp. rate (!) 47, height 5\' 3"  (1.6 m), weight 66.3 kg, last menstrual period 10/30/2010, SpO2 96 %.  Physical Exam Constitutional:      General: She is not in acute distress.    Appearance: She is not toxic-appearing.  HENT:     Mouth/Throat:  Mouth: Mucous membranes are moist.  Eyes:     Extraocular Movements: Extraocular movements intact.     Pupils: Pupils are equal, round, and reactive to light.  Neck:     Musculoskeletal: Normal range of motion and neck supple.  Cardiovascular:     Rate and Rhythm: Normal rate and regular rhythm.  Pulmonary:     Effort: Pulmonary effort is normal. No tachypnea, bradypnea or respiratory distress.     Breath sounds: Decreased breath sounds present.  Chest:    Abdominal:     General: Bowel sounds are normal. There is no distension.     Palpations: Abdomen is soft.     Tenderness: There is no abdominal tenderness.  Musculoskeletal:        General: No swelling or tenderness.     Right lower leg: No edema.     Left lower leg: No edema.  Neurological:     General: No focal deficit present.     Mental Status: She is alert and oriented to person, place, and time.  Psychiatric:        Mood and Affect: Mood normal.     Lab Results Results for orders placed or performed during the hospital encounter of 04/24/18 (from the past 48 hour(s))  Glucose, capillary     Status: Abnormal   Collection Time: 04/26/18 12:16 PM  Result Value Ref Range   Glucose-Capillary 167 (H) 70 - 99 mg/dL  Glucose, capillary     Status: None   Collection Time: 04/26/18  4:31 PM  Result Value Ref Range   Glucose-Capillary 86 70 - 99 mg/dL  Glucose, capillary     Status: Abnormal   Collection Time: 04/26/18 10:44 PM   Result Value Ref Range   Glucose-Capillary 45 (L) 70 - 99 mg/dL  Glucose, capillary     Status: None   Collection Time: 04/26/18 11:55 PM  Result Value Ref Range   Glucose-Capillary 72 70 - 99 mg/dL  CBC     Status: Abnormal   Collection Time: 04/27/18 12:11 AM  Result Value Ref Range   WBC 5.3 4.0 - 10.5 K/uL   RBC 2.75 (L) 3.87 - 5.11 MIL/uL   Hemoglobin 7.4 (L) 12.0 - 15.0 g/dL   HCT 24.3 (L) 36.0 - 46.0 %   MCV 88.4 80.0 - 100.0 fL   MCH 26.9 26.0 - 34.0 pg   MCHC 30.5 30.0 - 36.0 g/dL   RDW 19.2 (H) 11.5 - 15.5 %   Platelets 46 (L) 150 - 400 K/uL    Comment: REPEATED TO VERIFY Immature Platelet Fraction may be clinically indicated, consider ordering this additional test [id number] CONSISTENT WITH PREVIOUS RESULT    nRBC 0.4 (H) 0.0 - 0.2 %    Comment: Performed at [facility], [place] [address] [place], [place] [address]  Lactic acid, plasma     Status: None   Collection Time: 04/27/18 12:11 AM  Result Value Ref Range   Lactic Acid, Venous 1.8 0.5 - 1.9 mmol/L    Comment: Performed at [facility], [place] [address] [place], [place] [address]  Lactic acid, plasma     Status: None   Collection Time: 04/27/18  4:30 AM  Result Value Ref Range   Lactic Acid, Venous 1.5 0.5 - 1.9 mmol/L    Comment: Performed at [facility], [place] [address] [place], [place] [address]  Blood gas, arterial     Status: None   Collection Time: 04/27/18  6:00 AM  Result Value Ref Range   O2 Content 15.0 L/min   Delivery systems HI FLOW NASAL CANNULA    pH, Arterial 7.434 7.350 - 7.450   pCO2 arterial 39.2 32.0 - 48.0 mmHg   pO2, Arterial 90.7 83.0 - 108.0 mmHg   Bicarbonate 25.1 20.0 - 28.0 mmol/L   Acid-Base Excess 1.9 0.0 - 2.0 mmol/L   O2 Saturation 97.0 %   Patient temperature 102.8    Collection site RIGHT RADIAL    Drawn by [id number]    Sample type ARTERIAL    Allens test (pass/fail) PASS PASS    Comment: Performed at [facility], [place] [address] [place], [place] [address]  Glucose, capillary     Status: Abnormal   Collection Time: 04/27/18  7:56 AM  Result Value Ref Range   Glucose-Capillary 51 (L) 70 - 99 mg/dL  Glucose, capillary     Status: None   Collection Time: 04/27/18  8:31 AM  Result Value Ref Range   Glucose-Capillary 93 70 - 99 mg/dL  Lactate dehydrogenase (pleural or peritoneal fluid)     Status: Abnormal   Collection Time: 04/27/18  9:50 AM  Result Value Ref Range   LD, Fluid 256 (H) 3 - 23 U/L    Comment: (NOTE) Results should be evaluated in conjunction with serum values    Fluid Type-FLDH Pleural, L     Comment: Performed at [facility], [place] [address] [place], [place] [address]  Body fluid cell count with differential     Status: Abnormal   Collection Time: 04/27/18  9:50 AM  Result Value Ref Range   Fluid Type-FCT Pleural, L    Color, Fluid AMBER (A) YELLOW   Appearance, Fluid CLOUDY (A) CLEAR   WBC, Fluid 12,469 (H) 0 - 1,000 cu mm   Neutrophil Count, Fluid 86 (H) 0 - 25 %   Lymphs, Fluid 14 %   Monocyte-Macrophage-Serous Fluid 0 (L) 50 - 90 %   Eos, Fluid 0 %   Other Cells, Fluid CORRELATE WITH CYTOLOGY. %    Comment: Performed at [facility], [place] [address], [place], [place] [address]  Protein, pleural or peritoneal fluid     Status: None   Collection Time: 04/27/18  9:50 AM  Result Value Ref Range   Total protein, fluid 3.2 g/dL    Comment: (NOTE) No normal range established for this test Results should be evaluated in conjunction with serum values    Fluid Type-FTP Pleural, L     Comment: Performed at [facility], [place] [address] [place], [place] [address]  Glucose, pleural or peritoneal fluid     Status: None   Collection Time: 04/27/18  9:50 AM  Result Value Ref Range   Glucose, Fluid 82 mg/dL    Comment: (NOTE) No normal range established for this test Results should be evaluated in  conjunction with serum values    Fluid Type-FGLU Pleural, L     Comment: Performed at [facility], [place] [address] [place], [place] [address]  Gram stain     Status: None   Collection Time: 04/27/18  9:50 AM  Result Value Ref Range   Specimen Description PLEURAL LEFT    Special Requests NONE    Gram Stain      MODERATE WBC PRESENT,BOTH PMN AND MONONUCLEAR NO ORGANISMS SEEN Performed at [facility], [place] [address] [place],  [address]    Report  Status 04/27/2018 FINAL   Glucose, capillary     Status: None   Collection Time: 04/27/18 11:23 AM  Result Value Ref Range   Glucose-Capillary 71 70 - 99 mg/dL  Glucose, capillary     Status: None   Collection Time: 04/27/18 12:58 PM  Result Value Ref Range   Glucose-Capillary 76 70 - 99 mg/dL  Glucose, capillary     Status: Abnormal   Collection Time: 04/27/18  4:46 PM  Result Value Ref Range   Glucose-Capillary 172 (H) 70 - 99 mg/dL  Glucose, capillary     Status: Abnormal   Collection Time: 04/27/18  8:25 PM  Result Value Ref Range   Glucose-Capillary 138 (H) 70 - 99 mg/dL  Glucose, capillary     Status: Abnormal   Collection Time: 04/28/18 12:26 AM  Result Value Ref Range   Glucose-Capillary 140 (H) 70 - 99 mg/dL  Comprehensive metabolic panel     Status: Abnormal   Collection Time: 04/28/18  3:37 AM  Result Value Ref Range   Sodium 140 135 - 145 mmol/L   Potassium 3.5 3.5 - 5.1 mmol/L   Chloride 106 98 - 111 mmol/L   CO2 25 22 - 32 mmol/L   Glucose, Bld 144 (H) 70 - 99 mg/dL   BUN 17 6 - 20 mg/dL   Creatinine, Ser 1.00 0.44 - 1.00 mg/dL   Calcium 7.7 (L) 8.9 - 10.3 mg/dL   Total Protein 5.5 (L) 6.5 - 8.1 g/dL   Albumin 1.8 (L) 3.5 - 5.0 g/dL   AST 27 15 - 41 U/L   ALT 24 0 - 44 U/L   Alkaline Phosphatase 162 (H) 38 - 126 U/L   Total Bilirubin 2.6 (H) 0.3 - 1.2 mg/dL   GFR calc non Af Amer >60 >60 mL/min   GFR calc Af Amer >60 >60 mL/min   Anion gap 9 5 - 15    Comment: Performed at  [facility], [place] [address] [place], [place] [address]  CBC     Status: Abnormal   Collection Time: 04/28/18  3:37 AM  Result Value Ref Range   WBC 3.3 (L) 4.0 - 10.5 K/uL   RBC 2.26 (L) 3.87 - 5.11 MIL/uL   Hemoglobin 6.2 (LL) 12.0 - 15.0 g/dL    Comment: This critical result has verified and been called to CERIC, B. RN by Raelyn Ensign on 03 30 2020 at 0545, and has been read back. CRITICAL RESULT VERIFIED   HCT 20.5 (L) 36.0 - 46.0 %   MCV 90.7 80.0 - 100.0 fL   MCH 27.4 26.0 - 34.0 pg   MCHC 30.2 30.0 - 36.0 g/dL   RDW 19.7 (H) 11.5 - 15.5 %   Platelets 26 (LL) 150 - 400 K/uL    Comment: PLATELET COUNT CONFIRMED BY SMEAR SPECIMEN CHECKED FOR CLOTS Immature Platelet Fraction may be clinically indicated, consider ordering this additional test [id number] THIS CRITICAL RESULT HAS VERIFIED AND BEEN CALLED TO CERIC, B. RN BY NICOLE MCCOY ON 03 30 2020 AT 0545, AND HAS BEEN READ BACK. CRITICAL RESULT VERIFIED    nRBC 0.0 0.0 - 0.2 %    Comment: Performed at [facility], [place] [address], [place], [place] [address]  Fibrinogen     Status: Abnormal   Collection Time: 04/28/18  3:37 AM  Result Value Ref Range   Fibrinogen >800 (H) 210 - 475 mg/dL    Comment: Performed at [facility], [place]  [address] [place], [place] [address]  Prepare RBC     Status: None   Collection Time: 04/28/18  6:09 AM  Result Value Ref Range   Order Confirmation      ORDER PROCESSED BY BLOOD BANK Performed at [facility], [place] [address], [place], [place] [address]   Glucose, capillary     Status: Abnormal   Collection Time: 04/28/18  8:15 AM  Result Value Ref Range   Glucose-Capillary 135 (H) 70 - 99 mg/dL      Component Value Date/Time   SDES PLEURAL LEFT 04/27/2018 0950   SPECREQUEST NONE 04/27/2018 0950   CULT  04/24/2018 0930    NO GROWTH 3 DAYS Performed at [facility], [place] [address] [place], [place] [address]      CULT  04/24/2018 0930    NO GROWTH 3 DAYS Performed at [facility] [address], [place], [place] [address]    REPTSTATUS 04/27/2018 FINAL 04/27/2018 0950   Ct Angio Chest Pe W Or Wo Contrast  Addendum Date: 04/27/2018   ADDENDUM REPORT: 04/27/2018 14:04 ADDENDUM: Upon further review, there is not a large pleural effusion. There is noted complete consolidation of the left lower lobe with mild-to-moderate amount of surrounding fluid or effusion. Electronically Signed   By: Marijo Conception, M.D.   On: 04/27/2018 14:04   Result Date: 04/27/2018 CLINICAL DATA:  Interstitial lung disease.  Fever, cough. EXAM: CT ANGIOGRAPHY CHEST WITH CONTRAST TECHNIQUE: Multidetector CT imaging of the chest was performed using the standard protocol during bolus administration of intravenous contrast. Multiplanar CT image reconstructions and MIPs were obtained to evaluate the vascular anatomy. CONTRAST:  127mL OMNIPAQUE IOHEXOL 350 MG/ML SOLN COMPARISON:  CT scan of April 08, 2018. FINDINGS: Cardiovascular: No evidence of large central pulmonary embolus is noted. However, evaluation of the lower lobe branches is limited due to respiratory motion artifact. Pulmonary emboli in small peripheral branches can not be excluded. No evidence of thoracic aortic dissection or aneurysm. Mild cardiomegaly is noted. No pericardial effusion is noted. Mediastinum/Nodes: Thyroid gland and esophagus are unremarkable. Stable calcified subcarinal and right hilar adenopathy is noted. 15 mm right paratracheal lymph node is noted which is enlarged compared to prior exam. Stable 1 cm pretracheal lymph node is noted. Lungs/Pleura: No pneumothorax is noted. Large left pleural effusion is noted with atelectasis of the left lower lobe. Subsegmental atelectasis or pneumonia of the left upper lobe is noted as well. Decreased right upper and lower lobe opacities are noted suggesting improving pneumonia or atelectasis. Upper Abdomen: Stable  hepatic metastases are noted. Musculoskeletal: No chest wall abnormality. No acute or significant osseous findings. Review of the MIP images confirms the above findings. IMPRESSION: No evidence of large central pulmonary embolus is noted in the main pulmonary artery or the main portions of the right and left pulmonary arteries. However, evaluation of the lower lobe branches, particularly on the right, is limited due to respiratory motion artifact and other limiting issues. Pulmonary emboli and smaller peripheral branches can not be excluded on the basis of this exam. Large left pleural effusion is noted with complete atelectasis of the left lower lobe. Increased left upper lobe opacity is noted posteriorly concerning for atelectasis or pneumonia. Improved right upper lobe and lower lobe opacities are noted suggesting improving pneumonia or atelectasis. 15 mm right paratracheal lymph node is noted which is enlarged compared to prior exam; it is uncertain if this is metastatic or inflammatory in etiology. Hepatic metastatic  lesions are again noted. Aortic Atherosclerosis (ICD10-I70.0). Electronically Signed: By: Marijo Conception, M.D. On: 04/27/2018 12:37   Dg Chest Port 1 View  Result Date: 04/27/2018 CLINICAL DATA:  Status post left thoracentesis. EXAM: PORTABLE CHEST 1 VIEW COMPARISON:  Radiograph of same day. FINDINGS: Stable cardiomediastinal silhouette. Right internal jugular Port-A-Cath is unchanged in position. Stable right upper lobe opacity is noted. Stable left basilar opacity is noted consistent with pneumonia or atelectasis and associated pleural effusion. No pneumothorax is noted. Bony thorax is unremarkable. IMPRESSION: No pneumothorax status post left-sided thoracentesis. Stable left basilar opacity is noted consistent with pneumonia or atelectasis and associated pleural effusion. Stable right upper lobe opacity is noted which may represent acute inflammation or possibly scarring. Electronically  Signed   By: Marijo Conception, M.D.   On: 04/27/2018 10:36   Dg Chest Port 1 View  Result Date: 04/27/2018 CLINICAL DATA:  Hypoxia EXAM: PORTABLE CHEST 1 VIEW COMPARISON:  04/24/2018 FINDINGS: Right upper lobe opacity, reflecting residual pneumonia/post infectious scarring, improved from prior CT. Moderate layering left pleural effusion, significantly increased from recent chest radiograph. Associated left upper lobe opacity, possibly reflecting infection and/or compressive atelectasis. No pneumothorax. The heart is top-normal in size. Right chest power port terminates in the cavoatrial junction. IMPRESSION: Moderate layering left pleural effusion, significantly increased. Associated left upper lobe opacity, possibly reflecting infection and/or compressive atelectasis. Right upper lobe opacity, reflecting residual pneumonia/post infectious scarring, improved from prior CT. Electronically Signed   By: Julian Hy M.D.   On: 04/27/2018 06:30   US Thoracentesis Asp Pleural Space W/img Guide  Result Date: 04/27/2018 INDICATION: Patient with shortness of breath, pleural effusion. Request is made for diagnostic and therapeutic left thoracentesis. EXAM: ULTRASOUND GUIDED DIAGNOSTIC AND THERAPEUTIC LEFT THORACENTESIS MEDICATIONS: 10 mL 1% lidocaine COMPLICATIONS: None immediate. PROCEDURE: An ultrasound guided thoracentesis was thoroughly discussed with the patient and questions answered. The benefits, risks, alternatives and complications were also discussed. The patient understands and wishes to proceed with the procedure. Written consent was obtained. Ultrasound was performed to localize and mark an adequate pocket of fluid in the left chest. The area was then prepped and draped in the normal sterile fashion. 1% Lidocaine was used for local anesthesia. Under ultrasound guidance a 6 Fr Safe-T-Centesis catheter was introduced. Thoracentesis was performed. The catheter was removed and a dressing applied.  FINDINGS: Pleural fluid volume by ultrasound is small. A total of approximately 120 mL of blood-tinged fluid was removed. Samples were sent to the laboratory as requested by the clinical team. IMPRESSION: Successful ultrasound guided left thoracentesis yielding 120 mL of blood-tinged of pleural fluid. Read by: Brynda Greathouse PA-C Electronically Signed   By: Aletta Edouard M.D.   On: 04/27/2018 09:59   Recent Results (from the past 240 hour(s))  Blood Culture (routine x 2)     Status: None (Preliminary result)   Collection Time: 04/24/18  9:30 AM  Result Value Ref Range Status   Specimen Description   Final    BLOOD RIGHT CHEST Performed at [facility] [address], [place], [place] [address]    Special Requests   Final    BOTTLES DRAWN AEROBIC AND ANAEROBIC Blood Culture adequate volume Performed at [facility] [address], [place], [place] [address]    Culture   Final    NO GROWTH 3 DAYS Performed at [facility], [place] [address] [place], [place] [address]    Report Status PENDING  Incomplete  Blood Culture (routine x 2)     Status: None (Preliminary result)   Collection Time: 04/24/18  9:30 AM  Result Value Ref Range Status   Specimen Description   Final    BLOOD RIGHT ANTECUBITAL Performed at [facility] [address] [place], [place] [address]    Special Requests   Final    BOTTLES DRAWN AEROBIC AND ANAEROBIC Blood Culture adequate volume Performed at [facility] [address] [place], [place] [address]    Culture   Final    NO GROWTH 3 DAYS Performed at [facility], [place] [address], [place], [place] [address]    Report Status PENDING  Incomplete  Respiratory Panel by PCR     Status: None   Collection Time: 04/25/18 11:52 AM  Result Value Ref Range Status   Adenovirus NOT DETECTED NOT DETECTED Final   Coronavirus 229E NOT DETECTED NOT DETECTED Final    Comment:  (NOTE) The Coronavirus on the Respiratory Panel, DOES NOT test for the novel  Coronavirus (2019 nCoV)    Coronavirus HKU1 NOT DETECTED NOT DETECTED Final   Coronavirus NL63 NOT DETECTED NOT DETECTED Final   Coronavirus OC43 NOT DETECTED NOT DETECTED Final   Metapneumovirus NOT DETECTED NOT DETECTED Final   Rhinovirus / Enterovirus NOT DETECTED NOT DETECTED Final   Influenza A NOT DETECTED NOT DETECTED Final   Influenza B NOT DETECTED NOT DETECTED Final   Parainfluenza Virus 1 NOT DETECTED NOT DETECTED Final   Parainfluenza Virus 2 NOT DETECTED NOT DETECTED Final   Parainfluenza Virus 3 NOT DETECTED NOT DETECTED Final   Parainfluenza Virus 4 NOT DETECTED NOT DETECTED Final   Respiratory Syncytial Virus NOT DETECTED NOT DETECTED Final   Bordetella pertussis NOT DETECTED NOT DETECTED Final   Chlamydophila pneumoniae NOT DETECTED NOT DETECTED Final   Mycoplasma pneumoniae NOT DETECTED NOT DETECTED Final    Comment: Performed at [facility] [facility], [address] [place], [place] [address]  Gram stain     Status: None   Collection Time: 04/27/18  9:50 AM  Result Value Ref Range Status   Specimen Description PLEURAL LEFT  Final   Special Requests NONE  Final   Gram Stain   Final    MODERATE WBC PRESENT,BOTH PMN AND MONONUCLEAR NO ORGANISMS SEEN Performed at [facility], [address], [place], [place] [address]    Report Status 04/27/2018 FINAL  Final    Microbiology: Recent Results (from the past 240 hour(s))  Blood Culture (routine x 2)     Status: None (Preliminary result)   Collection Time: 04/24/18  9:30 AM  Result Value Ref Range Status   Specimen Description   Final    BLOOD RIGHT CHEST Performed at [facility] [address], [place], [place] [address]    Special Requests   Final    BOTTLES DRAWN AEROBIC AND ANAEROBIC Blood Culture adequate volume Performed at [facility] [address], [place], [place] [address]     Culture   Final    NO GROWTH 3 DAYS Performed at [facility], [place] [address] [place],  [address]    Report Status PENDING  Incomplete  Blood Culture (routine x 2)     Status: None (Preliminary result)   Collection Time: 04/24/18  9:30 AM  Result Value Ref Range Status   Specimen Description   Final    BLOOD RIGHT ANTECUBITAL Performed at [facility]  [facility], [name] [address] [place], [place] [address]    Special Requests   Final    BOTTLES DRAWN AEROBIC AND ANAEROBIC Blood Culture adequate volume Performed at [facility] [address], [place], [place] [address]    Culture   Final    NO GROWTH 3 DAYS Performed at [facility], [place] [address] [place], [place] [address]    Report Status PENDING  Incomplete  Respiratory Panel by PCR     Status: None   Collection Time: 04/25/18 11:52 AM  Result Value Ref Range Status   Adenovirus NOT DETECTED NOT DETECTED Final   Coronavirus 229E NOT DETECTED NOT DETECTED Final    Comment: (NOTE) The Coronavirus on the Respiratory Panel, DOES NOT test for the novel  Coronavirus (2019 nCoV)    Coronavirus HKU1 NOT DETECTED NOT DETECTED Final   Coronavirus NL63 NOT DETECTED NOT DETECTED Final   Coronavirus OC43 NOT DETECTED NOT DETECTED Final   Metapneumovirus NOT DETECTED NOT DETECTED Final   Rhinovirus / Enterovirus NOT DETECTED NOT DETECTED Final   Influenza A NOT DETECTED NOT DETECTED Final   Influenza B NOT DETECTED NOT DETECTED Final   Parainfluenza Virus 1 NOT DETECTED NOT DETECTED Final   Parainfluenza Virus 2 NOT DETECTED NOT DETECTED Final   Parainfluenza Virus 3 NOT DETECTED NOT DETECTED Final   Parainfluenza Virus 4 NOT DETECTED NOT DETECTED Final   Respiratory Syncytial Virus NOT DETECTED NOT DETECTED Final   Bordetella pertussis NOT DETECTED NOT DETECTED Final   Chlamydophila pneumoniae NOT DETECTED NOT DETECTED Final   Mycoplasma pneumoniae NOT DETECTED NOT DETECTED Final     Comment: Performed at [facility], [address], [place], [place] [address]  Gram stain     Status: None   Collection Time: 04/27/18  9:50 AM  Result Value Ref Range Status   Specimen Description PLEURAL LEFT  Final   Special Requests NONE  Final   Gram Stain   Final    MODERATE WBC PRESENT,BOTH PMN AND MONONUCLEAR NO ORGANISMS SEEN Performed at [facility], [address] [place], [place] [address]    Report Status 04/27/2018 FINAL  Final    Radiographs and labs were personally reviewed by me.   Bobby Rumpf, MD [facility] [phone] 04/28/2018, 11:49 AM

## 2018-04-28 NOTE — Progress Notes (Signed)
Pharmacy Antibiotic Note  Stacy Shelton is a 58 y.o. female with PMH metastatic breast CA admitted on 04/24/2018 with pneumonia.  Pharmacy has been consulted for Vancomycin dosing. Patient has also been placed on Zosyn per MD  Today, 04/28/2018:  D5 vanc/cefepime  Continues to have fevers to 103F  WBC stable WNL (Active cancer on chemo)  SCr significantly increased  Trying to differentiate source between new pleural effusions (infectious vs malignant), which is worsened, and retrolaryngeal fluid collection, which is improved - thoracentesis performed 3/29  Plan:  Zosyn 3.375 grams q 8 hours extended infusion per MD.   With significantly increased SCr, will obtain random vancomycin level to evaluate dosing   Vancomycin variable dosing ordered for placeholder  Will redose vancomycin or obtain further levels as clinically indicated   Height: 5\' 3"  (160 cm) Weight: 146 lb 2.6 oz (66.3 kg) IBW/kg (Calculated) : 52.4  Temp (24hrs), Avg:100.2 F (37.9 C), Min:98.2 F (36.8 C), Max:103.2 F (39.6 C)  Recent Labs  Lab 04/24/18 0930 04/24/18 1754 04/25/18 0443 04/26/18 0351 04/27/18 0011 04/27/18 0430 04/28/18 0337  WBC 5.6  --  4.9 6.3 5.3  --  3.3*  CREATININE 0.57  --  0.57 0.68  --   --  1.00  LATICACIDVEN 0.9 0.7  --   --  1.8 1.5  --     Estimated Creatinine Clearance: 56.1 mL/min (by C-G formula based on SCr of 1 mg/dL).    Allergies  Allergen Reactions  . Nsaids Anaphylaxis    Antimicrobials this admission: 3/26 Vancomycin >> 3/26 Cefepime >>   Dose adjustments this admission:  Microbiology results: 3/26 BCx: ngtd 3/26 RVP: neg, Flu A/B neg 3/26 Strep pneumo UAg: neg 3/26 Sputum: ordered  3/29 pleural fluid: ngtd 3/30 COVID-19:     Thank you for allowing pharmacy to be a part of this patient's care.  Ulice Dash, PharmD Clinical Pharmacist Pager # [phone]  04/28/2018, 1:50 PM

## 2018-04-28 NOTE — Progress Notes (Signed)
Called pharmacy and received ok from pharmacist to proceed with administration of zosyn scheduled at 2200.

## 2018-04-29 ENCOUNTER — Inpatient Hospital Stay (HOSPITAL_COMMUNITY): Payer: BLUE CROSS/BLUE SHIELD

## 2018-04-29 DIAGNOSIS — J181 Lobar pneumonia, unspecified organism: Secondary | ICD-10-CM

## 2018-04-29 DIAGNOSIS — R0682 Tachypnea, not elsewhere classified: Secondary | ICD-10-CM

## 2018-04-29 DIAGNOSIS — J390 Retropharyngeal and parapharyngeal abscess: Secondary | ICD-10-CM

## 2018-04-29 LAB — CBC WITH DIFFERENTIAL/PLATELET
Abs Immature Granulocytes: 0.86 10*3/uL — ABNORMAL HIGH (ref 0.00–0.07)
Basophils Absolute: 0.3 10*3/uL — ABNORMAL HIGH (ref 0.0–0.1)
Basophils Relative: 3 %
Eosinophils Absolute: 0 10*3/uL (ref 0.0–0.5)
Eosinophils Relative: 0 %
HCT: 25.7 % — ABNORMAL LOW (ref 36.0–46.0)
Hemoglobin: 8 g/dL — ABNORMAL LOW (ref 12.0–15.0)
Immature Granulocytes: 10 %
Lymphocytes Relative: 11 %
Lymphs Abs: 1 10*3/uL (ref 0.7–4.0)
MCH: 27.7 pg (ref 26.0–34.0)
MCHC: 31.1 g/dL (ref 30.0–36.0)
MCV: 88.9 fL (ref 80.0–100.0)
Monocytes Absolute: 0.5 10*3/uL (ref 0.1–1.0)
Monocytes Relative: 5 %
Neutro Abs: 6 10*3/uL (ref 1.7–7.7)
Neutrophils Relative %: 71 %
Platelets: 30 10*3/uL — ABNORMAL LOW (ref 150–400)
RBC: 2.89 MIL/uL — ABNORMAL LOW (ref 3.87–5.11)
RDW: 19.9 % — ABNORMAL HIGH (ref 11.5–15.5)
WBC: 8.6 10*3/uL (ref 4.0–10.5)
nRBC: 0 % (ref 0.0–0.2)

## 2018-04-29 LAB — CULTURE, BLOOD (ROUTINE X 2)
CULTURE: NO GROWTH
Culture: NO GROWTH
Special Requests: ADEQUATE
Special Requests: ADEQUATE

## 2018-04-29 LAB — COMPREHENSIVE METABOLIC PANEL
ALT: 22 U/L (ref 0–44)
AST: 24 U/L (ref 15–41)
Albumin: 1.8 g/dL — ABNORMAL LOW (ref 3.5–5.0)
Alkaline Phosphatase: 237 U/L — ABNORMAL HIGH (ref 38–126)
Anion gap: 9 (ref 5–15)
BUN: 18 mg/dL (ref 6–20)
CO2: 25 mmol/L (ref 22–32)
Calcium: 8.1 mg/dL — ABNORMAL LOW (ref 8.9–10.3)
Chloride: 103 mmol/L (ref 98–111)
Creatinine, Ser: 0.75 mg/dL (ref 0.44–1.00)
GFR calc Af Amer: >60 mL/min (ref >60)
GFR calc non Af Amer: >60 mL/min (ref >60)
Glucose, Bld: 219 mg/dL — ABNORMAL HIGH (ref 70–99)
Potassium: 3.5 mmol/L (ref 3.5–5.1)
Sodium: 137 mmol/L (ref 135–145)
Total Bilirubin: 3.6 mg/dL — ABNORMAL HIGH (ref 0.3–1.2)
Total Protein: 5.9 g/dL — ABNORMAL LOW (ref 6.5–8.1)

## 2018-04-29 LAB — BLOOD GAS, ARTERIAL
Acid-Base Excess: 2.8 mmol/L — ABNORMAL HIGH (ref 0.0–2.0)
Bicarbonate: 26.5 mmol/L (ref 20.0–28.0)
Drawn by: 422461
O2 Content: 10 L/min
O2 Saturation: 96.7 %
Patient temperature: 37
pCO2 arterial: 38.8 mmHg (ref 32.0–48.0)
pH, Arterial: 7.449 (ref 7.350–7.450)
pO2, Arterial: 84.2 mmHg (ref 83.0–108.0)

## 2018-04-29 LAB — TYPE AND SCREEN
ABO/RH(D): A POS
Antibody Screen: NEGATIVE
Unit division: 0
Unit division: 0

## 2018-04-29 LAB — BPAM RBC
Blood Product Expiration Date: 202004142359
Blood Product Expiration Date: 202004142359
ISSUE DATE / TIME: 202003271356
ISSUE DATE / TIME: 202003301154
Unit Type and Rh: 6200
Unit Type and Rh: 6200

## 2018-04-29 LAB — GLUCOSE, CAPILLARY
Glucose-Capillary: 211 mg/dL — ABNORMAL HIGH (ref 70–99)
Glucose-Capillary: 166 mg/dL — ABNORMAL HIGH (ref 70–99)
Glucose-Capillary: 135 mg/dL — ABNORMAL HIGH (ref 70–99)
Glucose-Capillary: 105 mg/dL — ABNORMAL HIGH (ref 70–99)

## 2018-04-29 LAB — PROCALCITONIN: Procalcitonin: 15.7 ng/mL

## 2018-04-29 LAB — FERRITIN: Ferritin: 4442 ng/mL — ABNORMAL HIGH (ref 11–307)

## 2018-04-29 LAB — C-REACTIVE PROTEIN: CRP: 33.7 mg/dL — ABNORMAL HIGH (ref <1.0)

## 2018-04-29 MED ORDER — FUROSEMIDE 10 MG/ML IJ SOLN
40.0000 mg | Freq: Once | INTRAMUSCULAR | Status: AC
  Administered 2018-04-29: 40 mg via INTRAVENOUS

## 2018-04-29 MED ORDER — FUROSEMIDE 10 MG/ML IJ SOLN
INTRAMUSCULAR | Status: AC
  Filled 2018-04-29: qty 4

## 2018-04-29 MED ORDER — METOPROLOL TARTRATE 5 MG/5ML IV SOLN
INTRAVENOUS | Status: AC
  Filled 2018-04-29: qty 5

## 2018-04-29 MED ORDER — HYDROCOD POLST-CPM POLST ER 10-8 MG/5ML PO SUER
5.0000 mL | Freq: Once | ORAL | Status: AC
  Administered 2018-04-29: 5 mL via ORAL
  Filled 2018-04-29: qty 5

## 2018-04-29 MED ORDER — ADENOSINE 6 MG/2ML IV SOLN
INTRAVENOUS | Status: AC
  Administered 2018-04-29: 05:00:00
  Filled 2018-04-29: qty 4

## 2018-04-29 MED ORDER — LIP MEDEX EX OINT
TOPICAL_OINTMENT | CUTANEOUS | Status: AC
  Filled 2018-04-29: qty 7

## 2018-04-29 MED ORDER — METOPROLOL TARTRATE 5 MG/5ML IV SOLN
5.0000 mg | Freq: Once | INTRAVENOUS | Status: AC
  Administered 2018-04-29: 5 mg via INTRAVENOUS

## 2018-04-29 MED ORDER — ADENOSINE 6 MG/2ML IV SOLN
INTRAVENOUS | Status: AC
  Filled 2018-04-29: qty 2

## 2018-04-29 NOTE — Progress Notes (Signed)
INFECTIOUS DISEASE PROGRESS NOTE  ID: Stacy Shelton is a 58 y.o. female with  Principal Problem:   Fever Active Problems:   Uncontrolled diabetes mellitus with hyperglycemia (HCC)   Metastatic breast cancer (HCC)   Acute respiratory failure with hypoxia (HCC)   Left lower lobe pneumonia (HCC)   Chronic diastolic CHF (congestive heart failure) (HCC)   Anemia associated with chemotherapy   Chemotherapy-induced thrombocytopenia   Hypoxia  Subjective: Without complaints.  Has cough but none while I was present.    Abtx:  Anti-infectives (From admission, onward)   Start     Dose/Rate Route Frequency Ordered Stop   04/28/18 1800  vancomycin (VANCOCIN) IVPB 1000 mg/200 mL premix     1,000 mg 200 mL/hr over 60 Minutes Intravenous Every 24 hours 04/28/18 1748     04/28/18 1354  vancomycin variable dose per unstable renal function (pharmacist dosing)  Status:  Discontinued      Does not apply See admin instructions 04/28/18 1354 04/28/18 1748   04/26/18 2200  piperacillin-tazobactam (ZOSYN) IVPB 3.375 g     3.375 g 12.5 mL/hr over 240 Minutes Intravenous Every 8 hours 04/26/18 1602     04/26/18 2200  vancomycin (VANCOCIN) IVPB 750 mg/150 ml premix  Status:  Discontinued     750 mg 150 mL/hr over 60 Minutes Intravenous 2 times daily 04/26/18 1602 04/28/18 0914   04/25/18 2000  piperacillin-tazobactam (ZOSYN) IVPB 3.375 g  Status:  Discontinued     3.375 g 12.5 mL/hr over 240 Minutes Intravenous Every 8 hours 04/25/18 1239 04/26/18 1602   04/25/18 1245  piperacillin-tazobactam (ZOSYN) IVPB 3.375 g  Status:  Discontinued     3.375 g 100 mL/hr over 30 Minutes Intravenous Every 6 hours 04/25/18 1236 04/25/18 1237   04/24/18 1800  ceFEPIme (MAXIPIME) 1 g in sodium chloride 0.9 % 100 mL IVPB  Status:  Discontinued     1 g 200 mL/hr over 30 Minutes Intravenous Every 8 hours 04/24/18 1504 04/25/18 1236   04/24/18 1800  vancomycin (VANCOCIN) IVPB 750 mg/150 ml premix  Status:  Discontinued      750 mg 150 mL/hr over 60 Minutes Intravenous Every 12 hours 04/24/18 1525 04/26/18 1602   04/24/18 0915  vancomycin (VANCOCIN) IVPB 1000 mg/200 mL premix     1,000 mg 200 mL/hr over 60 Minutes Intravenous  Once 04/24/18 0904 04/24/18 1129   04/24/18 0915  ceFEPIme (MAXIPIME) 2 g in sodium chloride 0.9 % 100 mL IVPB     2 g 200 mL/hr over 30 Minutes Intravenous  Once 04/24/18 0904 04/24/18 1024      Medications:  Scheduled:  adenosine       chlorpheniramine-HYDROcodone  5 mL Oral Q12H   dextromethorphan-guaiFENesin  1 tablet Oral BID   feeding supplement (ENSURE ENLIVE)  237 mL Oral Q24H   feeding supplement (PRO-STAT SUGAR FREE 64)  30 mL Oral Daily   insulin aspart  0-15 Units Subcutaneous TID WC   lip balm       montelukast  10 mg Oral Daily   multivitamin with minerals  1 tablet Oral Daily   pantoprazole  40 mg Oral BID    Objective: Vital signs in last 24 hours: Temp:  [98.4 F (36.9 C)-101.3 F (38.5 C)] 100.8 F (38.2 C) (03/31 0447) Pulse Rate:  [97-115] 106 (03/31 0900) Resp:  [38-59] 50 (03/31 0900) BP: (90-146)/(37-56) 96/38 (03/31 0800) SpO2:  [93 %-100 %] 98 % (03/31 0900) Weight:  [72.2 kg] 72.2 kg (  03/31 0447)   General appearance: alert, cooperative and no distress Resp: rhonchi anterior - bilateral and tachypnea (40s) Chest wall: no tenderness, PORT is clean, no erythema.  Cardio: regular rate and rhythm GI: normal findings: bowel sounds normal and soft, non-tender  Lab Results Recent Labs    04/28/18 0337 04/28/18 1600 04/29/18 0220  WBC 3.3*  --  8.6  HGB 6.2* 9.2* 8.0*  HCT 20.5* 29.4* 25.7*  NA 140  --  137  K 3.5  --  3.5  CL 106  --  103  CO2 25  --  25  BUN 17  --  18  CREATININE 1.00  --  0.75   Liver Panel Recent Labs    04/28/18 0337 04/29/18 0220  PROT 5.5* 5.9*  ALBUMIN 1.8* 1.8*  AST 27 24  ALT 24 22  ALKPHOS 162* 237*  BILITOT 2.6* 3.6*   Sedimentation Rate No results for input(s): ESRSEDRATE in the  last 72 hours. C-Reactive Protein Recent Labs    04/29/18 0220  CRP 33.7*    Microbiology: Recent Results (from the past 240 hour(s))  Blood Culture (routine x 2)     Status: None   Collection Time: 04/24/18  9:30 AM  Result Value Ref Range Status   Specimen Description   Final    BLOOD RIGHT CHEST Performed at [facility] [address], [place], [place] [address]    Special Requests   Final    BOTTLES DRAWN AEROBIC AND ANAEROBIC Blood Culture adequate volume Performed at [facility] [address], [place], [place] [address]    Culture   Final    NO GROWTH 5 DAYS Performed at [facility], [place] [address] [place], [place] [address]    Report Status 04/29/2018 FINAL  Final  Blood Culture (routine x 2)     Status: None   Collection Time: 04/24/18  9:30 AM  Result Value Ref Range Status   Specimen Description   Final    BLOOD RIGHT ANTECUBITAL Performed at [facility] [address] [place], [place] [address]    Special Requests   Final    BOTTLES DRAWN AEROBIC AND ANAEROBIC Blood Culture adequate volume Performed at [facility] [address] [place], [place] [address]    Culture   Final    NO GROWTH 5 DAYS Performed at [facility], [place] [address] [place], [place] [address]    Report Status 04/29/2018 FINAL  Final  Respiratory Panel by PCR     Status: None   Collection Time: 04/25/18 11:52 AM  Result Value Ref Range Status   Adenovirus NOT DETECTED NOT DETECTED Final   Coronavirus 229E NOT DETECTED NOT DETECTED Final    Comment: (NOTE) The Coronavirus on the Respiratory Panel, DOES NOT test for the novel  Coronavirus (2019 nCoV)    Coronavirus HKU1 NOT DETECTED NOT DETECTED Final   Coronavirus NL63 NOT DETECTED NOT DETECTED Final   Coronavirus OC43 NOT DETECTED NOT DETECTED Final   Metapneumovirus NOT DETECTED NOT DETECTED Final   Rhinovirus / Enterovirus NOT  DETECTED NOT DETECTED Final   Influenza A NOT DETECTED NOT DETECTED Final   Influenza B NOT DETECTED NOT DETECTED Final   Parainfluenza Virus 1 NOT DETECTED NOT DETECTED Final   Parainfluenza Virus 2 NOT DETECTED NOT DETECTED Final   Parainfluenza Virus 3 NOT DETECTED NOT DETECTED Final   Parainfluenza Virus 4 NOT DETECTED NOT DETECTED Final  Respiratory Syncytial Virus NOT DETECTED NOT DETECTED Final   Bordetella pertussis NOT DETECTED NOT DETECTED Final   Chlamydophila pneumoniae NOT DETECTED NOT DETECTED Final   Mycoplasma pneumoniae NOT DETECTED NOT DETECTED Final    Comment: Performed at [facility], [place] [address] [place], [place] [address]  Culture, body fluid-bottle     Status: None (Preliminary result)   Collection Time: 04/27/18  9:50 AM  Result Value Ref Range Status   Specimen Description PLEURAL LEFT  Final   Special Requests NONE  Final   Culture   Final    NO GROWTH 2 DAYS Performed at [facility] [address], [place], [place] [address]    Report Status PENDING  Incomplete  Gram stain     Status: None   Collection Time: 04/27/18  9:50 AM  Result Value Ref Range Status   Specimen Description PLEURAL LEFT  Final   Special Requests NONE  Final   Gram Stain   Final    MODERATE WBC PRESENT,BOTH PMN AND MONONUCLEAR NO ORGANISMS SEEN Performed at [facility], [address] [place], [place] [address]    Report Status 04/27/2018 FINAL  Final    Studies/Results: Dg Chest 1 View  Result Date: 04/29/2018 CLINICAL DATA:  Tachypnea EXAM: CHEST  1 VIEW COMPARISON:  Chest CT from 2 days ago FINDINGS: There is small left pleural effusion and multifocal consolidation, particularly dense in the left lower lobe by prior CT. No superimposed Kerley lines. No pneumothorax. Stable heart size. Porta catheter on the right with tip at the SVC. IMPRESSION: Multifocal pneumonia and small left pleural effusion. Mild worsening of aeration at the right base since 2  days ago. Electronically Signed   By: Monte [name] M.D.   On: 04/29/2018 05:44   Ct Angio Chest Pe W Or Wo Contrast  Addendum Date: 04/27/2018   ADDENDUM REPORT: 04/27/2018 14:04 ADDENDUM: Upon further review, there is not a large pleural effusion. There is noted complete consolidation of the left lower lobe with mild-to-moderate amount of surrounding fluid or effusion. Electronically Signed   By: Marijo Conception, M.D.   On: 04/27/2018 14:04   Result Date: 04/27/2018 CLINICAL DATA:  Interstitial lung disease.  Fever, cough. EXAM: CT ANGIOGRAPHY CHEST WITH CONTRAST TECHNIQUE: Multidetector CT imaging of the chest was performed using the standard protocol during bolus administration of intravenous contrast. Multiplanar CT image reconstructions and MIPs were obtained to evaluate the vascular anatomy. CONTRAST:  122mL OMNIPAQUE IOHEXOL 350 MG/ML SOLN COMPARISON:  CT scan of April 08, 2018. FINDINGS: Cardiovascular: No evidence of large central pulmonary embolus is noted. However, evaluation of the lower lobe branches is limited due to respiratory motion artifact. Pulmonary emboli in small peripheral branches can not be excluded. No evidence of thoracic aortic dissection or aneurysm. Mild cardiomegaly is noted. No pericardial effusion is noted. Mediastinum/Nodes: Thyroid gland and esophagus are unremarkable. Stable calcified subcarinal and right hilar adenopathy is noted. 15 mm right paratracheal lymph node is noted which is enlarged compared to prior exam. Stable 1 cm pretracheal lymph node is noted. Lungs/Pleura: No pneumothorax is noted. Large left pleural effusion is noted with atelectasis of the left lower lobe. Subsegmental atelectasis or pneumonia of the left upper lobe is noted as well. Decreased right upper and lower lobe opacities are noted suggesting improving pneumonia or atelectasis. Upper Abdomen: Stable hepatic metastases are noted. Musculoskeletal: No chest wall abnormality. No acute or  significant osseous findings. Review of the MIP  images confirms the above findings. IMPRESSION: No evidence of large central pulmonary embolus is noted in the main pulmonary artery or the main portions of the right and left pulmonary arteries. However, evaluation of the lower lobe branches, particularly on the right, is limited due to respiratory motion artifact and other limiting issues. Pulmonary emboli and smaller peripheral branches can not be excluded on the basis of this exam. Large left pleural effusion is noted with complete atelectasis of the left lower lobe. Increased left upper lobe opacity is noted posteriorly concerning for atelectasis or pneumonia. Improved right upper lobe and lower lobe opacities are noted suggesting improving pneumonia or atelectasis. 15 mm right paratracheal lymph node is noted which is enlarged compared to prior exam; it is uncertain if this is metastatic or inflammatory in etiology. Hepatic metastatic lesions are again noted. Aortic Atherosclerosis (ICD10-I70.0). Electronically Signed: By: Marijo Conception, M.D. On: 04/27/2018 12:37     Assessment/Plan: Retropharyngeal abscess Breast Cancer Respiratory failure  Total days of antibiotics: 4 vanco/zosyn  CXR worsening on R base.  Continues to have fever.  Appreciate ENT f/u.  Not clear if fever from undrained abscess or alternative dx.  COVID testing sent yesterday. Hopefully resulted in 24-48h.           Bobby Rumpf MD, FACP Infectious Diseases (pager) [phone] [email or web address] 04/29/2018, 10:39 AM  LOS: 5 days

## 2018-04-29 NOTE — Progress Notes (Signed)
  Radiation Oncology         [phone] ________________________________  Name: Stacy Shelton MRN: [id number]  Date: 03/21/2018  DOB: 17-Apr-1960  End of Treatment Note  Diagnosis:   58 y.o. 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   There were no signs of acute toxicity after treatment.  Plan: The patient has completed radiation treatment. The patient will return to radiation oncology clinic for routine followup in one month. I advised the patient to call or return sooner if they have any questions or concerns related to their recovery or treatment. ________________________________  Jodelle Gross, MD, PhD  This document serves as a record of services personally performed by Kyung Rudd, MD. It was created on his behalf by Rae Lips, a trained medical scribe. The creation of this record is based on the scribe's personal observations and the provider's statements to them. This document has been checked and approved by the attending provider.

## 2018-04-29 NOTE — Progress Notes (Signed)
HR 180. EKG obtained. 10mg  total lopressor given, NP Schorr at bedside administered adenosine 6mg , which was effective. HR returned to 115-120. Pt tachypneic with RR 30-70. HFNC increased to 10L/min. Critical care Dr. Emmit Alexanders via telehealth gave verbal order for 40mg  lasix, given. Pt RR still 30-60s. MD aware. Will continue to monitor closely.

## 2018-04-29 NOTE — Progress Notes (Signed)
NAME:  Stacy Shelton, MRN:  [id number], DOB:  [birth year], LOS: 5 ADMISSION DATE:  04/24/2018, CONSULTATION DATE:  04/28/18 REFERRING MD:  Dr. Posey Pronto , CHIEF COMPLAINT:  SOB    Brief History   58 y/o F with metastatic breast cancer admitted 3/26 with reports of fever to 101.3 and cough.  She was admitted 3/9-3/18 with bilateral pneumonia and UTI.  Found to have concern for possible retropharyngeal abscess and LLL PNA with effusion.  Underwent left thoracentesis with preliminary fluid showing parapneumonic process (WBC 12k, LDH 256).  Cytology pending.   Past Medical History  Metastatic breast cancer - followed by Dr. Alvy Bimler, last chemo over 1 month before admit DM II  Chronic diastolic CHF  HTN  Significant Hospital Events   3/26  Readmit with SOB, fever, cough 3/30  Tx to SDU with increased O2 needs, COVID r/o initiated 3/31  O2 needs increased to 10L HFNC  Consults:  PCCM 3/30   Procedures:  Left Thoracentesis 3/29   Significant Diagnostic Tests:  CT Soft Tissue Neck 3/27 >> retropharyngeal fluid collection compatible with effusion or possible abscess, soft tissue thickening extends into the right, stranding surrounds the right submandibular gland.  Findings most compatible with infection.  Favor pharyngitis with effusion / abscess, multiple nodular densities in the RUL may be residual PNA vs metastatic disease CTA Chest 3/29 >> neg for PE, large left pleural effusion, 15 mm right paratracheal lymph node enlarged  Micro Data:  RVP 3/27 >> negative  BCx2 3/26 >> negative  R Pleural Fluid 3/29 >>    COVID 3/30 >>   Antimicrobials:  Cefepime 3/26 >> 3/27 Vanco 3/26 >>  Zosyn 3/27 >>   Interim history/subjective:  SVT overnight, treated with IV lopressor & adenosine per TRH.  O2 needs increased to HFNC. Tmax 100.8  Objective   Blood pressure (!) 96/38, pulse (!) 106, temperature (!) 100.8 F (38.2 C), temperature source Oral, resp. rate (!) 50, height 5\' 3"  (1.6 m), weight 72.2  kg, last menstrual period 10/30/2010, SpO2 98 %.        Intake/Output Summary (Last 24 hours) at 04/29/2018 1000 Last data filed at 04/29/2018 0600 Gross per 24 hour  Intake 1500.13 ml  Output 400 ml  Net 1100.13 ml   Filed Weights   04/25/18 1314 04/29/18 0447  Weight: 66.3 kg 72.2 kg    Examination: General: ill appearing adult female lying in bed in NAD HEENT: MM pink/moist, [place] O2, mild jaundice / scleral icterus  Neuro: AAOx4, speech clear, MAE  CV: s1s2 rrr, no m/r/g PULM: even/non-labored, diminished left base  KD:XIPJ, non-tender, bsx4 active  Extremities: warm/dry, no edema  Skin: no rashes or lesions  Resolved Hospital Problem list     Assessment & Plan:   Acute Hypoxemic Respiratory Failure  -in setting of LLL infiltrate, L effusion -increasing O2 need 3/30 from 2 to 6L, had similar admit recently P: Wean O2 for sats >90% Await COVID results  Follow CXR  Push mobilization, pulmonary hygiene as able  Would like to get her out of SDU / ICU as soon as able (avoid exposures)  Left Parapneumonic Pleural Effusion  -parapneumonic vs malignant process  -WBC 12,469 / LDH 256 P: Follow cultures, cytology  ABX per ID   Suspected Retropharyngeal Abscess Fever  P: Follow fever curve / WBC trend   Pancytopenia  P: Trend CBC  Transfuse per ICU guidelines  ONC following  SCD's given pancytopenia   Breast Cancer with Metastases to  Lung, Liver, Brain, Bone P: Per ONC  Best practice:  Diet: As tolerated Pain/Anxiety/Delirium protocol (if indicated): n/a VAP protocol (if indicated):  DVT prophylaxis: SCD's  GI prophylaxis: n/a  Glucose control: SSI  Mobility: as tolerated  Code Status: No intubation. Reviewed concept of CPR & relation to ventilation.  Patient wants to think about CPR status.  Family Communication: Sister and mother updated via phone 3/31.  Updated extensively on patients current status and discussion with patient regarding plan of care.    Disposition: SDU  Labs   CBC: Recent Labs  Lab 04/24/18 0930 04/25/18 0443 04/26/18 0351 04/27/18 0011 04/28/18 0337 04/28/18 1600 04/29/18 0220  WBC 5.6 4.9 6.3 5.3 3.3*  --  8.6  NEUTROABS 3.7 3.5  --   --   --   --  6.0  HGB 7.5* 6.7* 8.1* 7.4* 6.2* 9.2* 8.0*  HCT 25.1* 22.1* 27.2* 24.3* 20.5* 29.4* 25.7*  MCV 88.7 87.7 89.8 88.4 90.7  --  88.9  PLT 77* 63* 61*  61* 46* 26*  --  30*    Basic Metabolic Panel: Recent Labs  Lab 04/24/18 0930 04/25/18 0443 04/26/18 0351 04/28/18 0337 04/29/18 0220  NA 133* 137 138 140 137  K 3.9 4.0 3.5 3.5 3.5  CL 97* 104 104 106 103  CO2 27 25 24 25 25   GLUCOSE 138* 151* 91 144* 219*  BUN 11 9 8 17 18   CREATININE 0.57 0.57 0.68 1.00 0.75  CALCIUM 8.8* 8.2* 8.4* 7.7* 8.1*   GFR: Estimated Creatinine Clearance: 73 mL/min (by C-G formula based on SCr of 0.75 mg/dL). Recent Labs  Lab 04/24/18 0930 04/24/18 1754  04/26/18 0351 04/27/18 0011 04/27/18 0430 04/28/18 0337 04/29/18 0220  PROCALCITON  --  0.38  --   --   --   --   --  15.70  WBC 5.6  --    < > 6.3 5.3  --  3.3* 8.6  LATICACIDVEN 0.9 0.7  --   --  1.8 1.5  --   --    < > = values in this interval not displayed.    Liver Function Tests: Recent Labs  Lab 04/24/18 0930 04/28/18 0337 04/29/18 0220  AST 33 27 24  ALT 34 24 22  ALKPHOS 247* 162* 237*  BILITOT 2.1* 2.6* 3.6*  PROT 7.3 5.5* 5.9*  ALBUMIN 2.9* 1.8* 1.8*   No results for input(s): LIPASE, AMYLASE in the last 168 hours. No results for input(s): AMMONIA in the last 168 hours.  ABG    Component Value Date/Time   PHART 7.449 04/29/2018 0525   PCO2ART 38.8 04/29/2018 0525   PO2ART 84.2 04/29/2018 0525   HCO3 26.5 04/29/2018 0525   ACIDBASEDEF 6.1 (H) 04/08/2018 1547   O2SAT 96.7 04/29/2018 0525     Coagulation Profile: Recent Labs  Lab 04/26/18 0351  INR 1.3*    Cardiac Enzymes: No results for input(s): CKTOTAL, CKMB, CKMBINDEX, TROPONINI in the last 168 hours.  HbA1C: Hgb A1c MFr  Bld  Date/Time Value Ref Range Status  04/08/2018 02:16 AM 7.5 (H) 4.8 - 5.6 % Final    Comment:    (NOTE) Pre diabetes:          5.7%-6.4% Diabetes:              >6.4% Glycemic control for   <7.0% adults with diabetes   03/10/2018 11:50 PM 12.9 (H) 4.8 - 5.6 % Final    Comment:    (NOTE) Pre diabetes:  5.7%-6.4% Diabetes:              >6.4% Glycemic control for   <7.0% adults with diabetes     CBG: Recent Labs  Lab 04/27/18 2025 04/28/18 0026 04/28/18 0815 04/28/18 1247 04/28/18 1747  GLUCAP 138* 140* 135* 156* 197*    Critical care time: n/a    Noe Gens, NP-C [facility] Pgr: [phone] or if no answer [phone] 04/29/2018, 10:00 AM

## 2018-04-29 NOTE — Progress Notes (Signed)
On arrival to shift pt on 15L HFNC, running fevers, SOB, tachypneic, hypotensive, and coughing. Per report given from day shift RN MD is aware of these findings. Jefferey Pica RN also reported per MD Purohit, "ID consulted and pt does not need to be tested for COVID. PT had also refused testing." Droplet precautions were discontinued on day shift. Concerns raised with on call provider Arby Barrette, NP who recommends COVID testing. However, due to protocol and scope of practice he is unable to order this. NP Stated he will discuss these concerns with day shift MD again and recommended for staff to wear droplet masks in pt's room. AC made aware. Will continue to monitor closely and pass information along to oncoming RN.

## 2018-04-29 NOTE — Progress Notes (Signed)
Nutrition Follow-up  RD working remotely.   DOCUMENTATION CODES:   (Unable to assess for malnutrition at this time.)  INTERVENTION:  - Continue Ensure Enlive once/day, Magic Cup BID, and Prostat once/day. - Continue to encourage PO intakes and encourage to attempt soft solid foods.    NUTRITION DIAGNOSIS:   Increased nutrient needs related to chronic illness, catabolic illness, cancer and cancer related treatments as evidenced by estimated needs. -ongoing  GOAL:   Patient will meet greater than or equal to 90% of their needs -unmet at this time.   MONITOR:   PO intake, Supplement acceptance, Weight trends, Labs  ASSESSMENT:   58 year old with past medical history relevant for metastatic breast cancer, type 2 DM, CHF, and HTN who was initially hospitalized from 3/9-3/18 for septic shock thought to be 2/2 pneumonia. She presented to the ED with recurrent fevers and cough.  Weight +5.9 kg/13 lb from 3/27-3/31. Estimated nutrition needs from time of admission remain appropriate. Per review of RN flow sheet, patient consumed 0% of all meals yesterday. Spoke with RN via secure chat. This AM patient was drinking Ensure but has not been eating much solid food. Per review of orders, patient has accepted Ensure every time it was offered and accepted Prostat 3 of the 4 times it was offered.   Pending COVID-19 testing results (sent 3/30). L pleural effusion s/p thoracentesis on 3/29 with 120 ml fluid removed at that time. Patient with suspected retropharyngeal abscess.     Medications reviewed; 40 mg IV lasix x1 dose today, sliding scale novolog, daily multivitamin with minerals, 40 mg oral protonix BID.  Labs reviewed; CBG: 211 mg/dl, Ca: 8.1 mg/dl, alk phos elevated and trending up compared to 3/30.    Diet Order:   Diet Order            Diet Carb Modified Fluid consistency: Thin; Room service appropriate? Yes  Diet effective now              EDUCATION NEEDS:   Not appropriate  for education at this time  Skin:  Skin Assessment: Reviewed RN Assessment  Last BM:  3/30  Height:   Ht Readings from Last 1 Encounters:  04/24/18 5' 3"  (1.6 m)    Weight:   Wt Readings from Last 1 Encounters:  04/29/18 72.2 kg    Ideal Body Weight:  52.27 kg  BMI:  Body mass index is 28.2 kg/m.  Estimated Nutritional Needs:   Kcal:  1610-9604 kcal  Protein:  115-125 grams  Fluid:  >/= 2 L/day     Jarome Matin, MS, RD, LDN, [facility] Inpatient Clinical Dietitian Pager # [phone] After hours/weekend pager # [phone]

## 2018-04-29 NOTE — Progress Notes (Signed)
PROGRESS NOTE    Stacy Shelton  IPJ:[id number] DOB: 1960/03/05 DOA: 04/24/2018 PCP: Robyne Peers, MD  Brief Narrative:  58 year old with past medical history relevant for for metastatic breast cancer, type 2 diabetes, chronic diastolic heart failure, hypertension who was initially hospitalized from 04/07/2018 to 04/16/2018 for septic shock thought to be secondary to pneumonia who presents to the emergency department with recurrent fevers and cough and found to have retropharyngeal fluid collection with extension of inflammation around right carotid.  Her course has been complicated by acute hypoxic respiratory failure with left pleural effusion.  Assessment & Plan:   Principal Problem:   Fever Active Problems:   Uncontrolled diabetes mellitus with hyperglycemia (HCC)   Metastatic breast cancer ([place])   Acute respiratory failure with hypoxia (HCC)   Left lower lobe pneumonia (HCC)   Chronic diastolic CHF (congestive heart failure) (HCC)   Anemia associated with chemotherapy   Chemotherapy-induced thrombocytopenia   Hypoxia  #) Acute hypoxic respiratory failure:  Patient recently was treated for pneumonia in the beginning of March. Chest x-ray on admission was showing improvement in her bilateral patchy infiltrate. On 04/26/2018 overnight however patient became acutely hypoxic and continues to have persistent fever.   Chest x-ray shows lateral layering pleural effusion. 120 cc of fluid removed from thoracentesis.  Cytology is negative, cultures so far negative as well.  Although this appears to be an exudative effusion.  Overnight on 04/27/2018 after thoracentesis patient decompensated. Oxygenation worsened to 15 L of nasal cannula.  Also became hypotensive. Respiratory rate increased. Continues to remain febrile This is despite 4 days of IV antibiotics, -4-day blood culture, negative influenza, negative respiratory virus pathogen panel. At this point the patient is deemed high risk for  potential COVID.  Beginning of the morning the patient was not on any isolation, patient was initially placed on droplet contact isolation but with persistent coughing it was deemed that the patient will be at high risk for transmission and therefore airborne contact isolation was ordered. PCCM as well as ID have been given verbal information about this patient on the phone. Infection prevention is also notified. ICU charge nurse is aware of the patient as well. Discussed with ID recommend to continue current IV antibiotics. Avoiding BiPAP for now. If the patient's condition worsen she will require intubation.  #) Retropharyngeal fluid collection/tender cervical lymphadenopathy: She continues to have no dysphagia and decreased swelling of her right neck however she has persistent fevers - Continue IV Zosyn and vancomycin started 04/24/2018 -blood cultures ordered 04/24/2018 no growth to date -Influenza panel negative -Respiratory viral panel negative -ENT consulted, they will follow but feel like this can be treated with IV antibiotics -We will consider reimaging pending clinical course  #) COPD/asthma: -Continue PRN short-acting bronchodilators - Continue fluticasone twice daily  #) Type 2 diabetes: Patient having periods of hypoglycemia likely in the setting of uncontrolled infection -Hold aspart 8 units with meals -Discontinue detemir 20 units nightly -Hold metformin  #) Chronic diastolic heart failure: Patient appears to be euvolemic to hypovolemic currently.  #) Hypertension: Now the patient is hypotensive. Discontinue metoprolol succinate 50 mg nightly  #) Stage IV metastatic breast cancer: -Oncology notified  #) Pancytopenia. Patient has chronic thrombocytopenia.  Also chemotherapy-induced anemia. Now her WBC is also trending down. Oncology consulted, likely this is all in the setting of sepsis and possible COVID.  Per hematology currently transfusion support PRN for  hemoglobin less than 7.  They will be monitoring the patient electronically.   #)  Possible COVID Test is currently pending. Sent out on 04/28/2018. Continue airborne precaution.  Fluids: Tolerating p.o. Electrolytes: Monitor and supplement Nutrition: Carb restricted diet  Prophylaxis: Enoxaparin  Disposition: Pending resolution of fevers and thoracentesis  Full code Patient's respiratory condition continues to worsen.  Discussed patient's CODE STATUS. When I brought up the point for needing for ventilator given her worsening respiratory condition patient's initial response, she would not like to be intubated and placed on a ventilator. Likely based on the discussion with the PCCM and patient, patient is limited code DNI.  Informed the patient that I would also recommend her to be DNR and that would mean that if her condition worsens and her breathing stops or her heart stops that would be the end of life. She verbalized the understanding Discussed extensively with patient sister as well as patient mother.  While they want to respect patient's decision they would like to talk with her.  Currently the patient is limited code in the chart.  Prognosis remains poor.  Consultants:   Oncology, Dr. Alvy Bimler  ENT  Interventional radiology  Procedures:   None  Antimicrobials:   IV Zosyn and vancomycin started 04/24/2018   Subjective: Denies any acute complaint no nausea no vomiting no chest pain no chills.  Continues to have shortness of breath as well as cough.  Objective: Vitals:   04/29/18 1200 04/29/18 1300 04/29/18 1400 04/29/18 1500  BP: (!) 110/41  (!) 112/42   Pulse: (!) 103 (!) 113 (!) 108   Resp: (!) 50 (!) 38 (!) 49   Temp:    (!) 101.4 F (38.6 C)  TempSrc:      SpO2: 96% 92% 97%   Weight:      Height:        Intake/Output Summary (Last 24 hours) at 04/29/2018 1542 Last data filed at 04/29/2018 1100 Gross per 24 hour  Intake 929.49 ml  Output 900 ml  Net  29.49 ml   Filed Weights   04/25/18 1314 04/29/18 0447  Weight: 66.3 kg 72.2 kg    Examination: Visit was completed by telemedicine  General exam: Appears calm and comfortable  Psychiatry: Judgement and insight appear normal. Mood & affect appropriate.     Data Reviewed: I have personally reviewed following labs and imaging studies  CBC: Recent Labs  Lab 04/24/18 0930 04/25/18 0443 04/26/18 0351 04/27/18 0011 04/28/18 0337 04/28/18 1600 04/29/18 0220  WBC 5.6 4.9 6.3 5.3 3.3*  --  8.6  NEUTROABS 3.7 3.5  --   --   --   --  6.0  HGB 7.5* 6.7* 8.1* 7.4* 6.2* 9.2* 8.0*  HCT 25.1* 22.1* 27.2* 24.3* 20.5* 29.4* 25.7*  MCV 88.7 87.7 89.8 88.4 90.7  --  88.9  PLT 77* 63* 61*  61* 46* 26*  --  30*   Basic Metabolic Panel: Recent Labs  Lab 04/24/18 0930 04/25/18 0443 04/26/18 0351 04/28/18 0337 04/29/18 0220  NA 133* 137 138 140 137  K 3.9 4.0 3.5 3.5 3.5  CL 97* 104 104 106 103  CO2 27 25 24 25 25   GLUCOSE 138* 151* 91 144* 219*  BUN 11 9 8 17 18   CREATININE 0.57 0.57 0.68 1.00 0.75  CALCIUM 8.8* 8.2* 8.4* 7.7* 8.1*   GFR: Estimated Creatinine Clearance: 73 mL/min (by C-G formula based on SCr of 0.75 mg/dL). Liver Function Tests: Recent Labs  Lab 04/24/18 0930 04/28/18 0337 04/29/18 0220  AST 33 27 24  ALT 34 24 22  ALKPHOS 247* 162* 237*  BILITOT 2.1* 2.6* 3.6*  PROT 7.3 5.5* 5.9*  ALBUMIN 2.9* 1.8* 1.8*   No results for input(s): LIPASE, AMYLASE in the last 168 hours. No results for input(s): AMMONIA in the last 168 hours. Coagulation Profile: Recent Labs  Lab 04/26/18 0351  INR 1.3*   Cardiac Enzymes: No results for input(s): CKTOTAL, CKMB, CKMBINDEX, TROPONINI in the last 168 hours. BNP (last 3 results) No results for input(s): PROBNP in the last 8760 hours. HbA1C: No results for input(s): HGBA1C in the last 72 hours. CBG: Recent Labs  Lab 04/28/18 0815 04/28/18 1247 04/28/18 1747 04/29/18 1004 04/29/18 1435  GLUCAP 135* 156* 197*  211* 166*   Lipid Profile: No results for input(s): CHOL, HDL, LDLCALC, TRIG, CHOLHDL, LDLDIRECT in the last 72 hours. Thyroid Function Tests: No results for input(s): TSH, T4TOTAL, FREET4, T3FREE, THYROIDAB in the last 72 hours. Anemia Panel: Recent Labs    04/29/18 0220  FERRITIN 4,442*   Sepsis Labs: Recent Labs  Lab 04/24/18 0930 04/24/18 1754 04/27/18 0011 04/27/18 0430 04/29/18 0220  PROCALCITON  --  0.38  --   --  15.70  LATICACIDVEN 0.9 0.7 1.8 1.5  --     Recent Results (from the past 240 hour(s))  Blood Culture (routine x 2)     Status: None   Collection Time: 04/24/18  9:30 AM  Result Value Ref Range Status   Specimen Description   Final    BLOOD RIGHT CHEST Performed at [facility] [place], [place] [address] [place], [place] [address]    Special Requests   Final    BOTTLES DRAWN AEROBIC AND ANAEROBIC Blood Culture adequate volume Performed at [place] [address] [place], [place] [address]    Culture   Final    NO GROWTH 5 DAYS Performed at [facility], [place] [address], [place], [place] [address]    Report Status 04/29/2018 FINAL  Final  Blood Culture (routine x 2)     Status: None   Collection Time: 04/24/18  9:30 AM  Result Value Ref Range Status   Specimen Description   Final    BLOOD RIGHT ANTECUBITAL Performed at [place] [address], [place], [place] [address]    Special Requests   Final    BOTTLES DRAWN AEROBIC AND ANAEROBIC Blood Culture adequate volume Performed at [place] [address], [place], [place] [address]    Culture   Final    NO GROWTH 5 DAYS Performed at [facility], [place] [address] [place], [place] [address]    Report Status 04/29/2018 FINAL  Final  Respiratory Panel by PCR     Status: None   Collection Time: 04/25/18 11:52 AM  Result Value Ref Range Status   Adenovirus NOT DETECTED NOT DETECTED Final   Coronavirus 229E  NOT DETECTED NOT DETECTED Final    Comment: (NOTE) The Coronavirus on the Respiratory Panel, DOES NOT test for the novel  Coronavirus (2019 nCoV)    Coronavirus HKU1 NOT DETECTED NOT DETECTED Final   Coronavirus NL63 NOT DETECTED NOT DETECTED Final   Coronavirus OC43 NOT DETECTED NOT DETECTED Final   Metapneumovirus NOT DETECTED NOT DETECTED Final   Rhinovirus / Enterovirus NOT DETECTED NOT DETECTED Final   Influenza A NOT DETECTED NOT DETECTED Final   Influenza B NOT DETECTED NOT DETECTED Final   Parainfluenza Virus 1 NOT DETECTED NOT DETECTED Final   Parainfluenza Virus 2  NOT DETECTED NOT DETECTED Final   Parainfluenza Virus 3 NOT DETECTED NOT DETECTED Final   Parainfluenza Virus 4 NOT DETECTED NOT DETECTED Final   Respiratory Syncytial Virus NOT DETECTED NOT DETECTED Final   Bordetella pertussis NOT DETECTED NOT DETECTED Final   Chlamydophila pneumoniae NOT DETECTED NOT DETECTED Final   Mycoplasma pneumoniae NOT DETECTED NOT DETECTED Final    Comment: Performed at [facility], [place] [address], [place], [place] [address]  Culture, body fluid-bottle     Status: None (Preliminary result)   Collection Time: 04/27/18  9:50 AM  Result Value Ref Range Status   Specimen Description PLEURAL LEFT  Final   Special Requests NONE  Final   Culture   Final    NO GROWTH 2 DAYS Performed at [place] [address], [place], [place] [address]    Report Status PENDING  Incomplete  Gram stain     Status: None   Collection Time: 04/27/18  9:50 AM  Result Value Ref Range Status   Specimen Description PLEURAL LEFT  Final   Special Requests NONE  Final   Gram Stain   Final    MODERATE WBC PRESENT,BOTH PMN AND MONONUCLEAR NO ORGANISMS SEEN Performed at [facility], [address] [place], [place] [address]    Report Status 04/27/2018 FINAL  Final         Radiology Studies: Dg Chest 1 View  Result Date: 04/29/2018 CLINICAL DATA:  Tachypnea EXAM: CHEST  1 VIEW  COMPARISON:  Chest CT from 2 days ago FINDINGS: There is small left pleural effusion and multifocal consolidation, particularly dense in the left lower lobe by prior CT. No superimposed Kerley lines. No pneumothorax. Stable heart size. Porta catheter on the right with tip at the SVC. IMPRESSION: Multifocal pneumonia and small left pleural effusion. Mild worsening of aeration at the right base since 2 days ago. Electronically Signed   By: Monte [name] M.D.   On: 04/29/2018 05:44        Scheduled Meds: . adenosine      . chlorpheniramine-HYDROcodone  5 mL Oral Q12H  . dextromethorphan-guaiFENesin  1 tablet Oral BID  . feeding supplement (ENSURE ENLIVE)  237 mL Oral Q24H  . feeding supplement (PRO-STAT SUGAR FREE 64)  30 mL Oral Daily  . insulin aspart  0-15 Units Subcutaneous TID WC  . lip balm      . montelukast  10 mg Oral Daily  . multivitamin with minerals  1 tablet Oral Daily  . pantoprazole  40 mg Oral BID   Continuous Infusions: . piperacillin-tazobactam (ZOSYN)  IV Stopped (04/29/18 1439)  . vancomycin 1,000 mg (04/29/18 1430)     LOS: 5 days    Time spent: [place], MD [facility]  If 7PM-7AM, please contact night-coverage [email or web address] Password [facility] 04/29/2018, 3:42 PM

## 2018-04-29 NOTE — Progress Notes (Signed)
   04/28/18 0027  Vitals  Temp (!) 102.5 F (39.2 C)  Temp Source Oral  BP (!) 103/47  MAP (mmHg) (!) 63  BP Location Right Arm  BP Method Automatic  Patient Position (if appropriate) Lying  Pulse Rate (!) 111  Pulse Rate Source Dinamap  Resp 20  Oxygen Therapy  SpO2 96 %  O2 Device HFNC  O2 Flow Rate (L/min) 10 L/min  MEWS Score  MEWS RR 0  MEWS Pulse 2  MEWS Systolic 0  MEWS LOC 0  MEWS Temp 2  MEWS Score 4  MEWS Score Color Red   On call provider made aware of MEWS change and increase in pt's RR. Will continue to monitor.

## 2018-04-30 DIAGNOSIS — J181 Lobar pneumonia, unspecified organism: Secondary | ICD-10-CM

## 2018-04-30 DIAGNOSIS — J9601 Acute respiratory failure with hypoxia: Secondary | ICD-10-CM

## 2018-04-30 DIAGNOSIS — R0902 Hypoxemia: Secondary | ICD-10-CM

## 2018-04-30 DIAGNOSIS — I471 Supraventricular tachycardia: Secondary | ICD-10-CM

## 2018-04-30 LAB — CBC WITH DIFFERENTIAL/PLATELET
Basophils Absolute: 0.1 10*3/uL (ref 0.0–0.1)
Basophils Relative: 1 %
Eosinophils Absolute: 0 10*3/uL (ref 0.0–0.5)
Eosinophils Relative: 0 %
HCT: 27.3 % — ABNORMAL LOW (ref 36.0–46.0)
Hemoglobin: 8.1 g/dL — ABNORMAL LOW (ref 12.0–15.0)
Lymphocytes Relative: 10 %
Lymphs Abs: 0.7 10*3/uL (ref 0.7–4.0)
MCH: 26.9 pg (ref 26.0–34.0)
MCHC: 29.7 g/dL — ABNORMAL LOW (ref 30.0–36.0)
MCV: 90.7 fL (ref 80.0–100.0)
MONOS PCT: 6 %
Monocytes Absolute: 0.4 10*3/uL (ref 0.1–1.0)
Neutro Abs: 4.9 10*3/uL (ref 1.7–7.7)
Neutrophils Relative %: 67 %
Platelets: 33 10*3/uL — ABNORMAL LOW (ref 150–400)
RBC: 3.01 MIL/uL — ABNORMAL LOW (ref 3.87–5.11)
RDW: 19.6 % — ABNORMAL HIGH (ref 11.5–15.5)
WBC: 7.4 10*3/uL (ref 4.0–10.5)
nRBC: 0 % (ref 0.0–0.2)

## 2018-04-30 LAB — BASIC METABOLIC PANEL
Anion gap: 8 (ref 5–15)
BUN: 16 mg/dL (ref 6–20)
CALCIUM: 8.1 mg/dL — AB (ref 8.9–10.3)
CO2: 31 mmol/L (ref 22–32)
Chloride: 102 mmol/L (ref 98–111)
Creatinine, Ser: 0.78 mg/dL (ref 0.44–1.00)
GFR calc Af Amer: >60 mL/min (ref >60)
GFR calc non Af Amer: >60 mL/min (ref >60)
Glucose, Bld: 156 mg/dL — ABNORMAL HIGH (ref 70–99)
Potassium: 2.9 mmol/L — ABNORMAL LOW (ref 3.5–5.1)
Sodium: 141 mmol/L (ref 135–145)

## 2018-04-30 LAB — GLUCOSE, CAPILLARY
Glucose-Capillary: 191 mg/dL — ABNORMAL HIGH (ref 70–99)
Glucose-Capillary: 155 mg/dL — ABNORMAL HIGH (ref 70–99)
Glucose-Capillary: 225 mg/dL — ABNORMAL HIGH (ref 70–99)

## 2018-04-30 LAB — MAGNESIUM: Magnesium: 1.8 mg/dL (ref 1.7–2.4)

## 2018-04-30 LAB — NOVEL CORONAVIRUS, NAA (HOSP ORDER, SEND-OUT TO REF LAB; TAT 18-24 HRS): SARS-CoV-2, NAA: NOT DETECTED

## 2018-04-30 LAB — CHOLESTEROL, BODY FLUID: Cholesterol, Fluid: 67 mg/dL

## 2018-04-30 MED ORDER — METOPROLOL TARTRATE 5 MG/5ML IV SOLN
INTRAVENOUS | Status: AC
  Administered 2018-04-30: 11:00:00 5 mg via INTRAVENOUS
  Filled 2018-04-30: qty 5

## 2018-04-30 MED ORDER — VANCOMYCIN HCL IN DEXTROSE 750-5 MG/150ML-% IV SOLN
750.0000 mg | Freq: Two times a day (BID) | INTRAVENOUS | Status: DC
  Administered 2018-04-30 – 2018-05-03 (×7): 750 mg via INTRAVENOUS
  Filled 2018-04-30 (×9): qty 150

## 2018-04-30 MED ORDER — METOPROLOL TARTRATE 5 MG/5ML IV SOLN
5.0000 mg | Freq: Once | INTRAVENOUS | Status: AC
  Administered 2018-04-30: 5 mg via INTRAVENOUS

## 2018-04-30 MED ORDER — METOPROLOL TARTRATE 5 MG/5ML IV SOLN
2.5000 mg | Freq: Four times a day (QID) | INTRAVENOUS | Status: DC | PRN
  Administered 2018-05-01: 10:00:00 2.5 mg via INTRAVENOUS
  Filled 2018-04-30: qty 5

## 2018-04-30 MED ORDER — SODIUM CHLORIDE 0.9 % IV BOLUS: 500.0000 mL | Freq: Once | INTRAVENOUS | Status: DC

## 2018-04-30 MED ORDER — SODIUM CHLORIDE 0.9 % IV BOLUS
500.0000 mL | Freq: Once | INTRAVENOUS | Status: AC
  Administered 2018-04-30: 500 mL via INTRAVENOUS

## 2018-04-30 MED ORDER — METOPROLOL TARTRATE 5 MG/5ML IV SOLN
5.0000 mg | Freq: Once | INTRAVENOUS | Status: DC
  Filled 2018-04-30: qty 5

## 2018-04-30 MED ORDER — POTASSIUM CHLORIDE CRYS ER 20 MEQ PO TBCR
40.0000 meq | EXTENDED_RELEASE_TABLET | Freq: Four times a day (QID) | ORAL | Status: AC
  Administered 2018-04-30 (×2): 40 meq via ORAL
  Filled 2018-04-30 (×2): qty 2

## 2018-04-30 MED ORDER — FUROSEMIDE 10 MG/ML IJ SOLN
40.0000 mg | Freq: Once | INTRAMUSCULAR | Status: AC
  Administered 2018-04-30: 40 mg via INTRAVENOUS
  Filled 2018-04-30: qty 4

## 2018-04-30 MED ORDER — CHLORHEXIDINE GLUCONATE 0.12 % MT SOLN
15.0000 mL | Freq: Two times a day (BID) | OROMUCOSAL | Status: DC
  Administered 2018-04-30 – 2018-05-05 (×11): 15 mL via OROMUCOSAL
  Filled 2018-04-30 (×10): qty 15

## 2018-04-30 MED ORDER — POTASSIUM CHLORIDE CRYS ER 20 MEQ PO TBCR
40.0000 meq | EXTENDED_RELEASE_TABLET | Freq: Once | ORAL | Status: AC
  Administered 2018-04-30: 40 meq via ORAL
  Filled 2018-04-30: qty 2

## 2018-04-30 MED ORDER — ORAL CARE MOUTH RINSE
15.0000 mL | Freq: Two times a day (BID) | OROMUCOSAL | Status: DC
  Administered 2018-04-30 – 2018-05-03 (×4): 15 mL via OROMUCOSAL

## 2018-04-30 MED ORDER — ORAL CARE MOUTH RINSE: 15.0000 mL | Freq: Two times a day (BID) | OROMUCOSAL | Status: DC

## 2018-04-30 MED ORDER — METOPROLOL TARTRATE 50 MG PO TABS
50.0000 mg | ORAL_TABLET | Freq: Two times a day (BID) | ORAL | Status: DC
  Administered 2018-04-30 – 2018-05-06 (×9): 50 mg via ORAL
  Filled 2018-04-30: qty 1
  Filled 2018-04-30: qty 2
  Filled 2018-04-30 (×2): qty 1
  Filled 2018-04-30: qty 2
  Filled 2018-04-30 (×3): qty 1
  Filled 2018-04-30: qty 2

## 2018-04-30 MED ORDER — ADENOSINE 6 MG/2ML IV SOLN
INTRAVENOUS | Status: AC
  Administered 2018-04-30: 6 mg
  Filled 2018-04-30: qty 2

## 2018-04-30 MED ORDER — CHLORHEXIDINE GLUCONATE CLOTH 2 % EX PADS
6.0000 | MEDICATED_PAD | Freq: Every day | CUTANEOUS | Status: DC
  Administered 2018-04-30 – 2018-05-05 (×5): 6 via TOPICAL

## 2018-04-30 NOTE — Progress Notes (Signed)
Pharmacy Antibiotic Note  Stacy Shelton is a 58 y.o. female with PMH metastatic breast CA admitted on 04/24/2018.  Currently on D5 broad spectrum antiobiotics for retropharyngeal abscess and respiratory failure.  COVID test pending.    Today, 04/30/2018:  Day 7  vanc and cefepime/Zosyn  Tm 101.4  WBC stable WNL (active cancer on chemo)  SCr significantly increased  Plan:  Continue Zosyn 3.375g IV Q8H infused over 4hrs per MD.   With stable renal function, change vancomycin back to 750 mg IV q12h   Will recheck levels and redose vancomycin as clinically indicated   Height: 5\' 3"  (160 cm) Weight: 157 lb 3 oz (71.3 kg) IBW/kg (Calculated) : 52.4  Temp (24hrs), Avg:99.6 F (37.6 C), Min:98 F (36.7 C), Max:101.4 F (38.6 C)  Recent Labs  Lab 04/24/18 0930 04/24/18 1754 04/25/18 0443 04/26/18 0351 04/27/18 0011 04/27/18 0430 04/28/18 0337 04/28/18 1600 04/29/18 0220 04/30/18 0308  WBC 5.6  --  4.9 6.3 5.3  --  3.3*  --  8.6 7.4  CREATININE 0.57  --  0.57 0.68  --   --  1.00  --  0.75 0.78  LATICACIDVEN 0.9 0.7  --   --  1.8 1.5  --   --   --   --   VANCORANDOM  --   --   --   --   --   --   --  10  --   --     Estimated Creatinine Clearance: 72.6 mL/min (by C-G formula based on SCr of 0.78 mg/dL).    Allergies  Allergen Reactions  . Nsaids Anaphylaxis    Antimicrobials this admission: 3/26 Vancomycin >> 3/26 Cefepime >> 3/27 3/27 Zosyn>>  Levels / Dose adjustments this admission: 3/30 1600 Vanc random level (17 hrs after last dose) = 10 4/1 Scr 0.78 change back to 750 mg IV q12h   Microbiology results: 3/26 BCx: NGF 3/26 RVP: neg, Flu A/B neg 3/26 Strep pneumo UAg: neg 3/26 Sputum: ordered  3/29 pleural fluid: NGF 3/30 COVID-19: sent:     Thank you for allowing pharmacy to be a part of this patient's care.    Royetta Asal, PharmD, BCPS Pager [phone] 04/30/2018 8:43 AM

## 2018-04-30 NOTE — Progress Notes (Signed)
Patient HR sustained 150-160s after giving Lopressor 5mg . Spoke with Dr [place] Pink who ordered Adenosine 6mg . Discussed this with patient and asked her about doing chest compressions and shock if necessary, during this discussion patient became upset and stated "I want to be intubated if it will save my life", discussed this further with patient to better understand her thoughts and she wants to be full code; she wants to be able to make the decision on being intubated if she is awake but if it she becomes unresponsive she wants full resuscitative efforts to be made. Patient responded well to Adenosine; VSS stable at this time.

## 2018-04-30 NOTE — Plan of Care (Signed)
Patient is anxious dealing with diagnosis and side effects. Shows willingness to be as involved in care as she can be. VSS now stable.

## 2018-04-30 NOTE — Progress Notes (Signed)
NAME:  Stacy Shelton, MRN:  [id number], DOB:  Mar 27, 1960, LOS: 6 ADMISSION DATE:  04/24/2018, CONSULTATION DATE:  04/28/18 REFERRING MD:  Dr. Posey Pronto , CHIEF COMPLAINT:  SOB    Brief History   58 y/o F with metastatic breast cancer admitted 3/26 with reports of fever to 101.3 and cough.  She was admitted 3/9-3/18 with bilateral pneumonia and UTI.  Found to have concern for possible retropharyngeal abscess and LLL PNA with effusion.  Underwent left thoracentesis with preliminary fluid showing parapneumonic process (WBC 12k, LDH 256).  Cytology pending.   Past Medical History  Metastatic breast cancer - followed by Dr. Alvy Bimler, last chemo over 1 month before admit DM II  Chronic diastolic CHF  HTN  Significant Hospital Events   3/26  Readmit with SOB, fever, cough 3/30  Tx to SDU with increased O2 needs, COVID r/o initiated 3/31  O2 needs increased to 10L HFNC. SVT overnight, treated with IV lopressor & adenosine    Consults:  PCCM 3/30   Procedures:  Left Thoracentesis 3/29   Significant Diagnostic Tests:  CT Soft Tissue Neck 3/27 >> retropharyngeal fluid collection compatible with effusion or possible abscess, soft tissue thickening extends into the right, stranding surrounds the right submandibular gland.  Findings most compatible with infection.  Favor pharyngitis with effusion / abscess, multiple nodular densities in the RUL may be residual PNA vs metastatic disease CTA Chest 3/29 >> neg for PE, large left pleural effusion, 15 mm right paratracheal lymph node enlarged  Micro Data:  RVP 3/27 >> negative  BCx2 3/26 >> negative  R Pleural Fluid 3/29 >>    COVID 3/30 >>  Pleural cytology 3/29 >> negative for malignant cells   Antimicrobials:  Cefepime 3/26 >> 3/27 Vanco 3/26 >> 4/2 (stop date added) Zosyn 3/27 >> 4/2  Interim history/subjective:  Pt reports she had a "rough night".  Feels like she has to stay quiet / not talk to avoid coughing.  Remains on 10L HFNC. K replaced.   Objective   Blood pressure (!) 117/45, pulse (!) 117, temperature 98 F (36.7 C), temperature source Oral, resp. rate (!) 37, height 5\' 3"  (1.6 m), weight 71.3 kg, last menstrual period 10/30/2010, SpO2 (!) 89 %.        Intake/Output Summary (Last 24 hours) at 04/30/2018 0948 Last data filed at 04/30/2018 0600 Gross per 24 hour  Intake 1270 ml  Output 795 ml  Net 475 ml   Filed Weights   04/25/18 1314 04/29/18 0447 04/30/18 0643  Weight: 66.3 kg 72.2 kg 71.3 kg    Examination: General: ill appearing adult female lying in bed in NAD HEENT: MM pink/moist, less neck swelling Neuro: AAOx4, speech clear, MAE CV: s1s2 rrr, no m/r/g PULM: even/non-labored, lungs bilaterally diminished L>R DI:YMEB, non-tender, bsx4 active  Extremities: warm/dry, no edema  Skin: no rashes or lesions  Resolved Hospital Problem list     Assessment & Plan:   Acute Hypoxemic Respiratory Failure  -in setting of LLL infiltrate, L effusion -increasing O2 need 3/30 from 2 to 6L, had similar admit recently P: Push pulmonary hygiene  Wean O2 for sats >90% Await COVID results  Follow CXR intermittently  Lasix 40 mg IV x1 (7+L positive)   Was DNI, changed status back to full code   Left Parapneumonic Pleural Effusion  -parapneumonic vs malignant process  -WBC 12,469 / LDH 256 P: Follow cultures, cytology   Suspected Retropharyngeal Abscess Fever  P: ABX as above, stop date added  for 2 weeks (for now, reassess daily pending review of neck)  Pancytopenia  P: Trend CBC  Transfuse per ICU guidelines  ONC following  SCD's   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, Liver, Brain, Bone P: Per ONC  Best practice:  Diet: As tolerated Pain/Anxiety/Delirium protocol (if indicated): n/a VAP protocol (if indicated):  DVT prophylaxis: SCD's  GI prophylaxis: n/a  Glucose control: SSI  Mobility: as tolerated  Code Status: Pt changed code status back to full code  Family Communication: Sister [name]) &  Mother updated via phone 4/1 Disposition: SDU  Labs   CBC: Recent Labs  Lab 04/24/18 0930 04/25/18 0443 04/26/18 0351 04/27/18 0011 04/28/18 0337 04/28/18 1600 04/29/18 0220 04/30/18 0308  WBC 5.6 4.9 6.3 5.3 3.3*  --  8.6 7.4  NEUTROABS 3.7 3.5  --   --   --   --  6.0 4.9  HGB 7.5* 6.7* 8.1* 7.4* 6.2* 9.2* 8.0* 8.1*  HCT 25.1* 22.1* 27.2* 24.3* 20.5* 29.4* 25.7* 27.3*  MCV 88.7 87.7 89.8 88.4 90.7  --  88.9 90.7  PLT 77* 63* 61*  61* 46* 26*  --  30* 33*    Basic Metabolic Panel: Recent Labs  Lab 04/25/18 0443 04/26/18 0351 04/28/18 0337 04/29/18 0220 04/30/18 0308  NA 137 138 140 137 141  K 4.0 3.5 3.5 3.5 2.9*  CL 104 104 106 103 102  CO2 25 24 25 25 31   GLUCOSE 151* 91 144* 219* 156*  BUN 9 8 17 18 16   CREATININE 0.57 0.68 1.00 0.75 0.78  CALCIUM 8.2* 8.4* 7.7* 8.1* 8.1*  MG  --   --   --   --  1.8   GFR: Estimated Creatinine Clearance: 72.6 mL/min (by C-G formula based on SCr of 0.78 mg/dL). Recent Labs  Lab 04/24/18 0930 04/24/18 1754  04/27/18 0011 04/27/18 0430 04/28/18 0337 04/29/18 0220 04/30/18 0308  PROCALCITON  --  0.38  --   --   --   --  15.70  --   WBC 5.6  --    < > 5.3  --  3.3* 8.6 7.4  LATICACIDVEN 0.9 0.7  --  1.8 1.5  --   --   --    < > = values in this interval not displayed.    Liver Function Tests: Recent Labs  Lab 04/24/18 0930 04/28/18 0337 04/29/18 0220  AST 33 27 24  ALT 34 24 22  ALKPHOS 247* 162* 237*  BILITOT 2.1* 2.6* 3.6*  PROT 7.3 5.5* 5.9*  ALBUMIN 2.9* 1.8* 1.8*   No results for input(s): LIPASE, AMYLASE in the last 168 hours. No results for input(s): AMMONIA in the last 168 hours.  ABG    Component Value Date/Time   PHART 7.449 04/29/2018 0525   PCO2ART 38.8 04/29/2018 0525   PO2ART 84.2 04/29/2018 0525   HCO3 26.5 04/29/2018 0525   ACIDBASEDEF 6.1 (H) 04/08/2018 1547   O2SAT 96.7 04/29/2018 0525     Coagulation Profile: Recent Labs  Lab 04/26/18 0351  INR 1.3*    Cardiac Enzymes: No  results for input(s): CKTOTAL, CKMB, CKMBINDEX, TROPONINI in the last 168 hours.  HbA1C: Hgb A1c MFr Bld  Date/Time Value Ref Range Status  04/08/2018 02:16 AM 7.5 (H) 4.8 - 5.6 % Final    Comment:    (NOTE) Pre diabetes:          5.7%-6.4% Diabetes:              >6.4%  Glycemic control for   <7.0% adults with diabetes   03/10/2018 11:50 PM 12.9 (H) 4.8 - 5.6 % Final    Comment:    (NOTE) Pre diabetes:          5.7%-6.4% Diabetes:              >6.4% Glycemic control for   <7.0% adults with diabetes     CBG: Recent Labs  Lab 04/28/18 1747 04/29/18 1004 04/29/18 1435 04/29/18 1607 04/29/18 2004  GLUCAP 197* 211* 166* 135* 105*    Critical care time: n/a    Noe Gens, NP-C [facility] Pgr: [phone] or if no answer [phone] 04/30/2018, 9:48 AM

## 2018-04-30 NOTE — Plan of Care (Signed)
Discussed with patient and family over the phone plan of care for the evening, pain management, nausea medications and decreased appetite with some teach back displayed.  Patient appears depressed and family stated that last visit they held her hand every day.  Patient still refusing mouth care as well.

## 2018-04-30 NOTE — Progress Notes (Addendum)
[facility] PROGRESS NOTE  RIDHIMA GOLBERG STM:[id number] DOB: 1960-12-07 DOA: 04/24/2018  PCP: Robyne Peers, MD  Brief History/Interval Summary: 58 year old with past medical history relevant for metastatic breast cancer, type 2 diabetes, chronic diastolic heart failure, hypertension who was initially hospitalized from 04/07/2018 to 04/16/2018 for septic shock thought to be secondary to pneumonia who presented to the emergency department with recurrent fevers and cough and found to have retropharyngeal fluid collection with extension of inflammation around right carotid.  Her course has been complicated by acute hypoxic respiratory failure with left pleural effusion.   Consultants:   Oncology, Dr. Alvy Bimler  ENT  Interventional radiology  Procedures: None  Antibiotics: Vancomycin and Zosyn  Subjective/Interval History: Patient continues to have a cough.  Continues to feel short of breath.  Denies any chest pain.  No nausea vomiting.  ROS: Denies any headaches  Objective:  Vital Signs  Vitals:   04/30/18 0600 04/30/18 0617 04/30/18 0643 04/30/18 0652  BP: (!) 117/45     Pulse: (!) 117     Resp: (!) 48 (!) 37  (!) 37  Temp:      TempSrc:      SpO2: 90% 91%  (!) 89%  Weight:   71.3 kg   Height:        Intake/Output Summary (Last 24 hours) at 04/30/2018 1138 Last data filed at 04/30/2018 0600 Gross per 24 hour  Intake 1270 ml  Output 295 ml  Net 975 ml   Filed Weights   04/25/18 1314 04/29/18 0447 04/30/18 0643  Weight: 66.3 kg 72.2 kg 71.3 kg    General appearance: alert, cooperative, appears stated age and no distress Head: Normocephalic, without obvious abnormality, atraumatic Resp: Noted to be tachypneic.  Coarse breath sounds bilaterally.  Crackles at the bases.  Few rhonchi on the left. Cardio: S1-S2 is tachycardic regular.  No S3-S4.  No rubs or bruit. GI: soft, non-tender; bowel sounds normal; no masses,  no organomegaly Extremities: extremities  normal, atraumatic, no cyanosis or edema Pulses: 2+ and symmetric Neurologic: No focal neurological deficits  Lab Results:  Data Reviewed: I have personally reviewed following labs and imaging studies  CBC: Recent Labs  Lab 04/24/18 0930 04/25/18 0443 04/26/18 0351 04/27/18 0011 04/28/18 0337 04/28/18 1600 04/29/18 0220 04/30/18 0308  WBC 5.6 4.9 6.3 5.3 3.3*  --  8.6 7.4  NEUTROABS 3.7 3.5  --   --   --   --  6.0 4.9  HGB 7.5* 6.7* 8.1* 7.4* 6.2* 9.2* 8.0* 8.1*  HCT 25.1* 22.1* 27.2* 24.3* 20.5* 29.4* 25.7* 27.3*  MCV 88.7 87.7 89.8 88.4 90.7  --  88.9 90.7  PLT 77* 63* 61*   61* 46* 26*  --  30* 33*    Basic Metabolic Panel: Recent Labs  Lab 04/25/18 0443 04/26/18 0351 04/28/18 0337 04/29/18 0220 04/30/18 0308  NA 137 138 140 137 141  K 4.0 3.5 3.5 3.5 2.9*  CL 104 104 106 103 102  CO2 25 24 25 25 31   GLUCOSE 151* 91 144* 219* 156*  BUN 9 8 17 18 16   CREATININE 0.57 0.68 1.00 0.75 0.78  CALCIUM 8.2* 8.4* 7.7* 8.1* 8.1*  MG  --   --   --   --  1.8    GFR: Estimated Creatinine Clearance: 72.6 mL/min (by C-G formula based on SCr of 0.78 mg/dL).  Liver Function Tests: Recent Labs  Lab 04/24/18 0930 04/28/18 0337 04/29/18 0220  AST 33 27 24  ALT 34 24 22  ALKPHOS 247* 162* 237*  BILITOT 2.1* 2.6* 3.6*  PROT 7.3 5.5* 5.9*  ALBUMIN 2.9* 1.8* 1.8*    Coagulation Profile: Recent Labs  Lab 04/26/18 0351  INR 1.3*    CBG: Recent Labs  Lab 04/29/18 1004 04/29/18 1435 04/29/18 1607 04/29/18 2004 04/30/18 0958  GLUCAP 211* 166* 135* 105* 191*    Anemia Panel: Recent Labs    04/29/18 0220  FERRITIN 4,442*    Recent Results (from the past 240 hour(s))  Blood Culture (routine x 2)     Status: None   Collection Time: 04/24/18  9:30 AM  Result Value Ref Range Status   Specimen Description   Final    BLOOD RIGHT CHEST Performed at [facility], [place] [address] [place], [place] [address]    Special Requests   Final     BOTTLES DRAWN AEROBIC AND ANAEROBIC Blood Culture adequate volume Performed at [place] [address], [place], [place] [address]    Culture   Final    NO GROWTH 5 DAYS Performed at [facility], [place] [address], [place], [place] [address]    Report Status 04/29/2018 FINAL  Final  Blood Culture (routine x 2)     Status: None   Collection Time: 04/24/18  9:30 AM  Result Value Ref Range Status   Specimen Description   Final    BLOOD RIGHT ANTECUBITAL Performed at [facility] [address], [place], [place] [address]    Special Requests   Final    BOTTLES DRAWN AEROBIC AND ANAEROBIC Blood Culture adequate volume Performed at [facility] [address] [place], [place] [address]    Culture   Final    NO GROWTH 5 DAYS Performed at [facility], [place] [address], [place], [place] [address]    Report Status 04/29/2018 FINAL  Final  Respiratory Panel by PCR     Status: None   Collection Time: 04/25/18 11:52 AM  Result Value Ref Range Status   Adenovirus NOT DETECTED NOT DETECTED Final   Coronavirus 229E NOT DETECTED NOT DETECTED Final    Comment: (NOTE) The Coronavirus on the Respiratory Panel, DOES NOT test for the novel  Coronavirus (2019 nCoV)    Coronavirus HKU1 NOT DETECTED NOT DETECTED Final   Coronavirus NL63 NOT DETECTED NOT DETECTED Final   Coronavirus OC43 NOT DETECTED NOT DETECTED Final   Metapneumovirus NOT DETECTED NOT DETECTED Final   Rhinovirus / Enterovirus NOT DETECTED NOT DETECTED Final   Influenza A NOT DETECTED NOT DETECTED Final   Influenza B NOT DETECTED NOT DETECTED Final   Parainfluenza Virus 1 NOT DETECTED NOT DETECTED Final   Parainfluenza Virus 2 NOT DETECTED NOT DETECTED Final   Parainfluenza Virus 3 NOT DETECTED NOT DETECTED Final   Parainfluenza Virus 4 NOT DETECTED NOT DETECTED Final   Respiratory Syncytial Virus NOT DETECTED NOT DETECTED Final   Bordetella pertussis NOT  DETECTED NOT DETECTED Final   Chlamydophila pneumoniae NOT DETECTED NOT DETECTED Final   Mycoplasma pneumoniae NOT DETECTED NOT DETECTED Final    Comment: Performed at [facility], [place] [address] [place], [place] [address]  Culture, body fluid-bottle     Status: None (Preliminary result)   Collection Time: 04/27/18  9:50 AM  Result Value Ref Range Status   Specimen Description PLEURAL LEFT  Final   Special Requests NONE  Final   Culture   Final  NO GROWTH 3 DAYS Performed at [facility], [place] [address] [place], [place] [address]    Report Status PENDING  Incomplete  Gram stain     Status: None   Collection Time: 04/27/18  9:50 AM  Result Value Ref Range Status   Specimen Description PLEURAL LEFT  Final   Special Requests NONE  Final   Gram Stain   Final    MODERATE WBC PRESENT,BOTH PMN AND MONONUCLEAR NO ORGANISMS SEEN Performed at [facility], [address], [place], [place] [address]    Report Status 04/27/2018 FINAL  Final      Radiology Studies: Dg Chest 1 View  Result Date: 04/29/2018 CLINICAL DATA:  Tachypnea EXAM: CHEST  1 VIEW COMPARISON:  Chest CT from 2 days ago FINDINGS: There is small left pleural effusion and multifocal consolidation, particularly dense in the left lower lobe by prior CT. No superimposed Kerley lines. No pneumothorax. Stable heart size. Porta catheter on the right with tip at the SVC. IMPRESSION: Multifocal pneumonia and small left pleural effusion. Mild worsening of aeration at the right base since 2 days ago. Electronically Signed   By: Monte [name] M.D.   On: 04/29/2018 05:44     Medications:  Scheduled:  Chlorhexidine Gluconate Cloth  6 each Topical Daily   chlorpheniramine-HYDROcodone  5 mL Oral Q12H   dextromethorphan-guaiFENesin  1 tablet Oral BID   feeding supplement (ENSURE ENLIVE)  237 mL Oral Q24H   feeding supplement (PRO-STAT SUGAR FREE 64)  30 mL Oral Daily   insulin aspart  0-15 Units  Subcutaneous TID WC   mouth rinse  15 mL Mouth Rinse BID   metoprolol tartrate  5 mg Intravenous Once   montelukast  10 mg Oral Daily   multivitamin with minerals  1 tablet Oral Daily   pantoprazole  40 mg Oral BID   potassium chloride  40 mEq Oral Q6H   Continuous:  piperacillin-tazobactam (ZOSYN)  IV 3.375 g (04/30/18 1005)   vancomycin 750 mg (04/30/18 1053)   JSH:FWYOVZCHYIFOY, alum & mag hydroxide-simeth, cyclobenzaprine, ondansetron (ZOFRAN) IV, sodium chloride flush    Assessment/Plan:  Date: 04/30/2018  Patient Isolation: Airborne+Contact  HCP PPE: Wearing all recommended PPE N95 mask + eye protection+ Gown + Gloves+ SurgicalCap Patient PPE: None   Acute respiratory failure with hypoxia Patient recently treated for pneumonia in the beginning of March.  Chest x-ray on admission actually showed improvement .  On 3/28 patient became acutely hypoxic and developed a fever.  Chest x-ray showed layering pleural effusion.  120 cc of fluid removed with the thoracentesis.  Cytology has been negative.  Cultures have been negative so far.  Fluid was thought to be exudative in nature.  Patient was continued on IV antibiotics.  She is currently on vancomycin and Zosyn.  Discussed with infectious disease. COVID test is pending (sent on 04/28/2018).  Patient remains on airborne plus contact precautions.  Influenza and respiratory viral pathogen panel was negative.  Patient remains on HFNC.  Supraventricular tachycardia EKG shows SVT.  Appears to be regular rhythm.  No previous history of arrhythmias.  EKG from yesterday morning also showed SVT.  Patient given a dose of metoprolol with improvement in the heart rate.  She will be given fluid boluses as her blood pressure did drop some.  May need additional doses of metoprolol.  Echocardiogram from February showed normal systolic function.  TSH was normal when checked in March.  ADDENDUM Patient's heart rate remained in the  160-170 despite  IV metoprolol.  EKG was done which showed SVT.  Patient was given adenosine 6 mg IV push.  Heart rate came down to 120s.  Rhythm shows sinus tachycardia.  She will be given oral metoprolol.  Continue to monitor.  Retropharyngeal fluid collection/tender cervical lymphadenopathy Patient was seen by ENT.  The fluid collection apparently has clinically improved.  Neck pain and swelling has also significantly improved.  No ENT intervention is recommended.  To continue with antibiotics for now.  History of COPD/asthma Stable.  See above.  Diabetes mellitus type 2 Patient has had a few episodes of hypoglycemia likely due to infection and poor oral intake.  Continue to monitor closely.  Holding her long-acting insulin for now.  History of chronic diastolic CHF Patient appears to be euvolemic to hypovolemic.  Continue to monitor for now.  Essential hypertension Patient with borderline blood pressures.  She was noted to be hypotensive and so her metoprolol was discontinued recently.  This could be the reason for her SVT this morning.  Metastatic breast cancer Oncology was notified.  Pancytopenia Patient has chronic thrombocytopenia.  Also has chemotherapy-induced anemia.  Hemoglobin is stable.  Hypokalemia This will be repleted.  Magnesium is normal.  DVT Prophylaxis: SCDs    Code Status: Partial code.  No intubation.  ADDENDUM: Patient requesting change in CODE STATUS to full code. Family Communication: Discussed with the patient Disposition Plan: Management as outlined above.     LOS: 6 days   Accalia Rigdon [employer] on [email or web address]  04/30/2018, 11:38 AM

## 2018-04-30 NOTE — Progress Notes (Signed)
Shift event note: Notified by Bedside RN regarding pt w/ SVT w/ HR in 160-170's confirmed by 12-lead EKG. Pt denies CP but does c/o SOB w/ RR in the 60's which has been intermittent most of the evening. RR RN was paged and responded to bedside. This NP ordered Lopressor 5 mg and responded to bedside. HR w/ minimal response to Lopressor. My colleague, Dr Alcario Drought paged to discuss need for Adenosine ASAP. RN was ask to notify MD in [facility] for bedside observation while pushing Adenosine. Appropriate PPE for designated high-risk Covid pt w/ test pending donned including, gloves, gown, N-95 mask covered w/ surgical mask, face shield and surgical cap. Upon entering the room pt's HR noted to be in th 180's and RR-50-70's. My colleague, Dr Alcario Drought is on unit as well. Adenosine 6 mg given IVP w/ rapid return to previous HR baseline of 115-120. Pt reports is aware her HR has improved. However, RR remains elevated 50-high 70's and shallow. 02 sats 95% or > on 10 L HFNC up from 2L [place]. BBS very diminished at the bases. ABG obtained  7.44/38.8/84.2/26.5 Assessment/Plan: 1. Superventricular Tachycardia: Returned to baseline tachycardia w/ IV Adenosine. Appears to be regular rhythm.  No previous history of arrhythmias.  EKG from yesterday morning also showed SVT. Patient given a dose of metoprolol with minimal improvement in the heart rate.  Echocardiogram from February showed normal systolic function.  TSH was normal when checked in March. 2. Tachypnea w/ increasing 02 demand: Insetting of pt w/ LLL infiltrate, L pleural effusion w/ h/o COPD, CHF and asthma. Push pulmonary toilet, continue HFNC at 10L/min, wean 02 to keep 02 sats > 90 as pt tolerates. Await COVID results and follow CXR intermittently. Lasix 40 mg IV x1 now (per Dr Emmit Alexanders w/ [facility]) (7+L positive)  Attempted to discuss code status and pt's wishes for full resuscitative measures should her respiratory status continue to deteriorate. But became short and curt and  stated she does not know yet what she wants to do. Explained that as it currently stands if she would stop breathing or heart stops she would get full resuscitative measures. She stated she understands. Will continue to monitor closely on SDU.  Jeryl Columbia, NP-C [facility] Pager [phone]  CRITICAL CARE Performed by: Jeryl Columbia   Total critical care time: 120 minutes  Critical care time was exclusive of separately billable procedures and treating other patients.  Critical care was necessary to treat or prevent imminent or life-threatening deterioration.  Critical care was time spent personally by me on the following activities: development of treatment plan with patient and/or surrogate as well as nursing, discussions with consultants, evaluation of patient's response to treatment, examination of patient, obtaining history from patient or surrogate, ordering and performing treatments and interventions, ordering and review of laboratory studies, ordering and review of radiographic studies, pulse oximetry and re-evaluation of patient's condition.

## 2018-04-30 NOTE — Progress Notes (Signed)
Subjective: No neck pain or sore throat. Her neck swelling has also resolved. COVID-19 lab pending. Still febrile.  Objective: Vital signs in last 24 hours: Temp:  [98 F (36.7 C)-101.4 F (38.6 C)] 98 F (36.7 C) (04/01 0400) Pulse Rate:  [100-117] 117 (04/01 0600) Resp:  [20-50] 37 (04/01 0652) BP: (95-120)/(34-45) 117/45 (04/01 0600) SpO2:  [89 %-97 %] 89 % (04/01 0652) Weight:  [71.3 kg] 71.3 kg (04/01 4982)  Physical Exam Constitutional: patient awake and alert. NAD. Eyes:Pupils are equal, round, reactive to light. Extraocular motion is intact.  Ears: Examination of the ears shows normal auricles and external auditory canals bilaterally.  Nose: Nasal examination shows normal mucosa, septum, turbinates.  Face: Facial examination shows no asymmetry. Palpation of the face elicit no significant tenderness.  Mouth: Oral cavity examination shows no mucosal abnormality. No significant trismus is noted.No purulent drainage. Neck:Palpation of the neck reveals resolution of her LAD and tenderness.The trachea is midline. The thyroid is not significantly enlarged.  Neuro: Cranial nerves 2-12 are all grossly in tact. Pulmonary:No stridor. No acute distress. Skin: Skin is warm.  Recent Labs    04/29/18 0220 04/30/18 0308  WBC 8.6 7.4  HGB 8.0* 8.1*  HCT 25.7* 27.3*  PLT 30* 33*   Recent Labs    04/29/18 0220 04/30/18 0308  NA 137 141  K 3.5 2.9*  CL 103 102  CO2 25 31  GLUCOSE 219* 156*  BUN 18 16  CREATININE 0.75 0.78  CALCIUM 8.1* 8.1*    Medications:  I have reviewed the patient's current medications. Scheduled: . Chlorhexidine Gluconate Cloth  6 each Topical Daily  . chlorpheniramine-HYDROcodone  5 mL Oral Q12H  . dextromethorphan-guaiFENesin  1 tablet Oral BID  . feeding supplement (ENSURE ENLIVE)  237 mL Oral Q24H  . feeding supplement (PRO-STAT SUGAR FREE 64)  30 mL Oral Daily  . insulin aspart  0-15 Units Subcutaneous TID WC  . mouth rinse  15 mL Mouth  Rinse BID  . metoprolol tartrate  5 mg Intravenous Once  . montelukast  10 mg Oral Daily  . multivitamin with minerals  1 tablet Oral Daily  . pantoprazole  40 mg Oral BID  . potassium chloride  40 mEq Oral Q6H   Continuous: . piperacillin-tazobactam (ZOSYN)  IV 3.375 g (04/30/18 1005)  . vancomycin 750 mg (04/30/18 1053)    Assessment/Plan: Retropharyngeal effusion has clinically improved. Her neck pain and swelling have essentially resolved. Still +fever and cough. -Viral panels sans COVID-19 are all negative. Her clinical course is strongly suggestive of COVID-19. - No ENT intervention is recommended as this time. - Other treatment per ID and CCM.   LOS: 6 days   Ajai Terhaar W Shantel Helwig 04/30/2018, 11:00 AM

## 2018-04-30 NOTE — Plan of Care (Signed)
Discussed with patient plan of care for the evening, pain management, coughing and respiratory rate with some teach back displayed

## 2018-05-01 ENCOUNTER — Inpatient Hospital Stay (HOSPITAL_COMMUNITY): Payer: BLUE CROSS/BLUE SHIELD

## 2018-05-01 DIAGNOSIS — I471 Supraventricular tachycardia: Secondary | ICD-10-CM

## 2018-05-01 DIAGNOSIS — R6889 Other general symptoms and signs: Secondary | ICD-10-CM

## 2018-05-01 DIAGNOSIS — R0603 Acute respiratory distress: Secondary | ICD-10-CM

## 2018-05-01 DIAGNOSIS — R0682 Tachypnea, not elsewhere classified: Secondary | ICD-10-CM

## 2018-05-01 DIAGNOSIS — J189 Pneumonia, unspecified organism: Principal | ICD-10-CM

## 2018-05-01 LAB — CBC
HCT: 24.2 % — ABNORMAL LOW (ref 36.0–46.0)
Hemoglobin: 7.4 g/dL — ABNORMAL LOW (ref 12.0–15.0)
MCH: 27.3 pg (ref 26.0–34.0)
MCHC: 30.6 g/dL (ref 30.0–36.0)
MCV: 89.3 fL (ref 80.0–100.0)
Platelets: 21 10*3/uL — CL (ref 150–400)
RBC: 2.71 MIL/uL — ABNORMAL LOW (ref 3.87–5.11)
RDW: 19.7 % — ABNORMAL HIGH (ref 11.5–15.5)
WBC: 7.1 10*3/uL (ref 4.0–10.5)
nRBC: 0 % (ref 0.0–0.2)

## 2018-05-01 LAB — BASIC METABOLIC PANEL
Anion gap: 10 (ref 5–15)
BUN: 19 mg/dL (ref 6–20)
CO2: 31 mmol/L (ref 22–32)
Calcium: 8 mg/dL — ABNORMAL LOW (ref 8.9–10.3)
Chloride: 98 mmol/L (ref 98–111)
Creatinine, Ser: 0.7 mg/dL (ref 0.44–1.00)
GFR calc Af Amer: >60 mL/min (ref >60)
GFR calc non Af Amer: >60 mL/min (ref >60)
Glucose, Bld: 185 mg/dL — ABNORMAL HIGH (ref 70–99)
Potassium: 3 mmol/L — ABNORMAL LOW (ref 3.5–5.1)
Sodium: 139 mmol/L (ref 135–145)

## 2018-05-01 LAB — GLUCOSE, CAPILLARY: Glucose-Capillary: 166 mg/dL — ABNORMAL HIGH (ref 70–99)

## 2018-05-01 MED ORDER — METOPROLOL TARTRATE 5 MG/5ML IV SOLN
5.0000 mg | Freq: Four times a day (QID) | INTRAVENOUS | Status: DC | PRN
  Administered 2018-05-02: 5 mg via INTRAVENOUS
  Filled 2018-05-01: qty 5

## 2018-05-01 MED ORDER — POTASSIUM CHLORIDE 20 MEQ/15ML (10%) PO SOLN
40.0000 meq | Freq: Once | ORAL | Status: AC
  Administered 2018-05-01: 23:00:00 40 meq via ORAL
  Filled 2018-05-01: qty 30

## 2018-05-01 MED ORDER — FUROSEMIDE 10 MG/ML IJ SOLN
40.0000 mg | Freq: Once | INTRAMUSCULAR | Status: AC
  Administered 2018-05-01: 12:00:00 40 mg via INTRAVENOUS
  Filled 2018-05-01: qty 4

## 2018-05-01 MED ORDER — POTASSIUM CHLORIDE CRYS ER 20 MEQ PO TBCR
40.0000 meq | EXTENDED_RELEASE_TABLET | Freq: Four times a day (QID) | ORAL | Status: AC
  Administered 2018-05-01: 40 meq via ORAL
  Filled 2018-05-01 (×2): qty 2

## 2018-05-01 MED ORDER — ADENOSINE 6 MG/2ML IV SOLN
6.0000 mg | Freq: Once | INTRAVENOUS | Status: AC
  Administered 2018-05-01: 6 mg via INTRAVENOUS

## 2018-05-01 MED ORDER — MAGNESIUM SULFATE IN D5W 1-5 GM/100ML-% IV SOLN
1.0000 g | Freq: Once | INTRAVENOUS | Status: AC
  Administered 2018-05-01: 1 g via INTRAVENOUS
  Filled 2018-05-01: qty 100

## 2018-05-01 NOTE — Progress Notes (Addendum)
[facility] PROGRESS NOTE  Stacy Shelton ERX:[id number] DOB: Jun 07, 1960 DOA: 04/24/2018  PCP: Robyne Peers, MD  Brief History/Interval Summary: 58 year old with past medical history relevant for metastatic breast cancer, type 2 diabetes, chronic diastolic heart failure, hypertension who was initially hospitalized from 04/07/2018 to 04/16/2018 for septic shock thought to be secondary to pneumonia who presented to the emergency department with recurrent fevers and cough and found to have retropharyngeal fluid collection with extension of inflammation around right carotid.  Her course has been complicated by acute hypoxic respiratory failure with left pleural effusion.   Consultants:   Oncology, Dr. Alvy Bimler  ENT  Interventional radiology  Procedures: None  Antibiotics: Vancomycin and Zosyn  Subjective/Interval History: Continues to be normal signal and 11 L oxygen flow Continues to be tachycardic with heart rate in the 160s to 170s Currently on a bedpan, appears depressed    ROS: Denies any headaches  Objective:  Vital Signs  Vitals:   05/01/18 0500 05/01/18 0600 05/01/18 0700 05/01/18 0800  BP: (!) 99/39 101/73 (!) 108/40 (!) 133/36  Pulse: 96 99 100 (!) 48  Resp: (!) 26 (!) 28 (!) 39 (!) 34  Temp:      TempSrc:      SpO2: 98% 99% 98% 95%  Weight:      Height:        Intake/Output Summary (Last 24 hours) at 05/01/2018 0944 Last data filed at 05/01/2018 0600 Gross per 24 hour  Intake 1240 ml  Output 3500 ml  Net -2260 ml   Filed Weights   04/25/18 1314 04/29/18 0447 04/30/18 0643  Weight: 66.3 kg 72.2 kg 71.3 kg    General appearance: alert, cooperative, appears stated age and no distress Head: Normocephalic, without obvious abnormality, atraumatic Resp: Noted to be tachypneic.  Coarse breath sounds bilaterally.  Crackles at the bases.  Few rhonchi on the left. Cardio: S1-S2 is tachycardic regular.  No S3-S4.  No rubs or bruit. GI: soft, non-tender;  bowel sounds normal; no masses,  no organomegaly Extremities: extremities normal, atraumatic, no cyanosis or edema Pulses: 2+ and symmetric Neurologic: No focal neurological deficits  Lab Results:  Data Reviewed: I have personally reviewed following labs and imaging studies  CBC: Recent Labs  Lab 04/25/18 0443  04/27/18 0011 04/28/18 0337 04/28/18 1600 04/29/18 0220 04/30/18 0308 05/01/18 0436  WBC 4.9   < > 5.3 3.3*  --  8.6 7.4 7.1  NEUTROABS 3.5  --   --   --   --  6.0 4.9  --   HGB 6.7*   < > 7.4* 6.2* 9.2* 8.0* 8.1* 7.4*  HCT 22.1*   < > 24.3* 20.5* 29.4* 25.7* 27.3* 24.2*  MCV 87.7   < > 88.4 90.7  --  88.9 90.7 89.3  PLT 63*   < > 46* 26*  --  30* 33* 21*   < > = values in this interval not displayed.    Basic Metabolic Panel: Recent Labs  Lab 04/26/18 0351 04/28/18 0337 04/29/18 0220 04/30/18 0308 05/01/18 0436  NA 138 140 137 141 139  K 3.5 3.5 3.5 2.9* 3.0*  CL 104 106 103 102 98  CO2 24 25 25 31 31   GLUCOSE 91 144* 219* 156* 185*  BUN 8 17 18 16 19   CREATININE 0.68 1.00 0.75 0.78 0.70  CALCIUM 8.4* 7.7* 8.1* 8.1* 8.0*  MG  --   --   --  1.8  --     GFR: Estimated  Creatinine Clearance: 72.6 mL/min (by C-G formula based on SCr of 0.7 mg/dL).  Liver Function Tests: Recent Labs  Lab 04/28/18 0337 04/29/18 0220  AST 27 24  ALT 24 22  ALKPHOS 162* 237*  BILITOT 2.6* 3.6*  PROT 5.5* 5.9*  ALBUMIN 1.8* 1.8*    Coagulation Profile: Recent Labs  Lab 04/26/18 0351  INR 1.3*    CBG: Recent Labs  Lab 04/29/18 2004 04/30/18 0958 04/30/18 1227 04/30/18 1812 05/01/18 0759  GLUCAP 105* 191* 155* 225* 166*    Anemia Panel: Recent Labs    04/29/18 0220  FERRITIN 4,442*    Recent Results (from the past 240 hour(s))  Blood Culture (routine x 2)     Status: None   Collection Time: 04/24/18  9:30 AM  Result Value Ref Range Status   Specimen Description   Final    BLOOD RIGHT CHEST Performed at [facility], [place]  [address] [place], [place] [address]    Special Requests   Final    BOTTLES DRAWN AEROBIC AND ANAEROBIC Blood Culture adequate volume Performed at [place] [address], [place], [place] [address]    Culture   Final    NO GROWTH 5 DAYS Performed at [facility], [place] [address], [place], [place] [address]    Report Status 04/29/2018 FINAL  Final  Blood Culture (routine x 2)     Status: None   Collection Time: 04/24/18  9:30 AM  Result Value Ref Range Status   Specimen Description   Final    BLOOD RIGHT ANTECUBITAL Performed at [facility] [address], [place], [place] [address]    Special Requests   Final    BOTTLES DRAWN AEROBIC AND ANAEROBIC Blood Culture adequate volume Performed at [facility] [address], [place], [place] [address]    Culture   Final    NO GROWTH 5 DAYS Performed at [facility], [place] [address], [place], [place] [address]    Report Status 04/29/2018 FINAL  Final  Respiratory Panel by PCR     Status: None   Collection Time: 04/25/18 11:52 AM  Result Value Ref Range Status   Adenovirus NOT DETECTED NOT DETECTED Final   Coronavirus 229E NOT DETECTED NOT DETECTED Final    Comment: (NOTE) The Coronavirus on the Respiratory Panel, DOES NOT test for the novel  Coronavirus (2019 nCoV)    Coronavirus HKU1 NOT DETECTED NOT DETECTED Final   Coronavirus NL63 NOT DETECTED NOT DETECTED Final   Coronavirus OC43 NOT DETECTED NOT DETECTED Final   Metapneumovirus NOT DETECTED NOT DETECTED Final   Rhinovirus / Enterovirus NOT DETECTED NOT DETECTED Final   Influenza A NOT DETECTED NOT DETECTED Final   Influenza B NOT DETECTED NOT DETECTED Final   Parainfluenza Virus 1 NOT DETECTED NOT DETECTED Final   Parainfluenza Virus 2 NOT DETECTED NOT DETECTED Final   Parainfluenza Virus 3 NOT DETECTED NOT DETECTED Final   Parainfluenza Virus 4 NOT DETECTED NOT DETECTED Final   Respiratory  Syncytial Virus NOT DETECTED NOT DETECTED Final   Bordetella pertussis NOT DETECTED NOT DETECTED Final   Chlamydophila pneumoniae NOT DETECTED NOT DETECTED Final   Mycoplasma pneumoniae NOT DETECTED NOT DETECTED Final    Comment: Performed at [facility], [place] [address] [place], [place] [address]  Culture, body fluid-bottle     Status: None (Preliminary result)   Collection Time: 04/27/18  9:50 AM  Result Value  Ref Range Status   Specimen Description PLEURAL LEFT  Final   Special Requests NONE  Final   Culture   Final    NO GROWTH 3 DAYS Performed at [facility], [address] [place], [place] [address]    Report Status PENDING  Incomplete  Gram stain     Status: None   Collection Time: 04/27/18  9:50 AM  Result Value Ref Range Status   Specimen Description PLEURAL LEFT  Final   Special Requests NONE  Final   Gram Stain   Final    MODERATE WBC PRESENT,BOTH PMN AND MONONUCLEAR NO ORGANISMS SEEN Performed at [facility], [address], [place], [place] [address]    Report Status 04/27/2018 FINAL  Final  Novel Coronavirus, NAA (hospital order; send-out to ref lab)     Status: None   Collection Time: 04/28/18  9:26 AM  Result Value Ref Range Status   SARS-CoV-2, NAA NOT DETECTED NOT DETECTED Final    Comment: Negative (Not Detected) results do not exclude infection caused by SARS CoV 2 and should not be used as the sole basis for treatment or other patient management decisions. Optimum specimen types and timing for peak viral levels during infections caused  by SARS CoV 2 have not been determined. Collection of multiple specimens (types and time points) from the same patient may be necessary to detect the virus. Improper specimen collection and handling, sequence variability underlying assay primers and or probes, or the presence of organisms in  quantities less than the limit of detection of the assay may lead to false negative results. Positive and negative  predictive values of testing are highly dependent on prevalence. False negative results are more likely when prevalence of disease is high. (NOTE) The expected result is Negative (Not Detected). The SARS CoV 2 test is intended for the presumptive qualitative  detection of nucleic acid from SARS CoV 2 in upper and lower  respir atory specimens. Testing methodology is real time RT PCR. Test results must be correlated with clinical presentation and  evaluated in the context of other laboratory and epidemiologic data.  Test performance can be affected because the epidemiology and  clinical spectrum of infection caused by SARS CoV 2 is not fully  known. For example, the optimum types of specimens to collect and  when during the course of infection these specimens are most likely  to contain detectable viral RNA may not be known. This test has not been Food and Drug Administration (FDA) cleared or  approved and has been authorized by FDA under an Emergency Use  Authorization (EUA). The test is only authorized for the duration of  the declaration that circumstances exist justifying the authorization  of emergency use of in vitro diagnostic tests for detection and or  diagnosis of SARS CoV 2 under Section 564(b)(1) of the Act, 21 U.S.C.  section [phone] 3(b)(1), unless the authorization is terminated or   revoked sooner. [facility] is certified under the  Clinical Laboratory Improvement Amendments of 1988 (CLIA), 42 U.S.C.  section [phone], to perform high complexity tests. Performed at [facility] [id number] [address], [place], [place] [address] Laboratory Director: Loleta Books, MD Performed at [facility], [place] [address] [place], [place] [address]    Coronavirus Source NASOPHARYNGEAL  Final    Comment: Performed at [facility], [place] [address], [place], [place] [address]  Radiology Studies:  Dg Chest Port 1 View  Result Date: 05/01/2018 CLINICAL DATA:  Respiratory failure EXAM: PORTABLE CHEST 1 VIEW COMPARISON:  04/29/2018 FINDINGS: Right Port-A-Cath remains in place, unchanged. Bilateral airspace disease, worsening on the right since prior study. Layering effusions noted. Heart is borderline in size. IMPRESSION: Bilateral airspace opacities, worsening on the right since prior study. Bilateral layering effusions. Electronically Signed   By: Rolm Baptise M.D.   On: 05/01/2018 07:21     Medications:  Scheduled: . chlorhexidine  15 mL Mouth Rinse BID  . Chlorhexidine Gluconate Cloth  6 each Topical Daily  . chlorpheniramine-HYDROcodone  5 mL Oral Q12H  . dextromethorphan-guaiFENesin  1 tablet Oral BID  . feeding supplement (ENSURE ENLIVE)  237 mL Oral Q24H  . feeding supplement (PRO-STAT SUGAR FREE 64)  30 mL Oral Daily  . insulin aspart  0-15 Units Subcutaneous TID WC  . mouth rinse  15 mL Mouth Rinse BID  . mouth rinse  15 mL Mouth Rinse q12n4p  . metoprolol tartrate  50 mg Oral BID  . montelukast  10 mg Oral Daily  . multivitamin with minerals  1 tablet Oral Daily  . pantoprazole  40 mg Oral BID   Continuous: . piperacillin-tazobactam (ZOSYN)  IV 3.375 g (05/01/18 0807)  . vancomycin Stopped (04/30/18 2048)   QIW:LNLGXQJJHERDE, alum & mag hydroxide-simeth, cyclobenzaprine, metoprolol tartrate, ondansetron (ZOFRAN) IV, sodium chloride flush    Assessment/Plan:  Date: 05/01/2018  Patient Isolation: Airborne+Contact  HCP PPE: Wearing all recommended PPE N95 mask + eye protection+ Gown + Gloves+ SurgicalCap Patient PPE: None   Acute respiratory failure with hypoxia Patient recently treated for pneumonia in the beginning of March.  Chest x-ray on admission actually showed improvement .  On 3/28 patient became acutely hypoxic and developed a fever.  Chest x-ray showed layering pleural effusion.  120 cc of fluid removed with the thoracentesis.  Cytology has been negative.   Cultures have been negative so far.  Fluid was thought to be exudative in nature.  Patient was continued on IV antibiotics.  She is currently on vancomycin and Zosyn.  Discussed with infectious disease. COVID test negative, although high pretest probability and therefore test will be repeated . Her clinical course is strongly suggestive of covid 19. Patient remains on airborne plus contact precautions.  Influenza and respiratory viral pathogen panel was negative.  Patient remains on HFNC.10 L, will remain in stepdown because of increasing oxygen requirements, tachycardia, she is also currently a full code  Supraventricular tachycardia EKG shows SVT.  Appears to be regular rhythm.  No previous history of arrhythmias.  EKG from yesterday morning also showed SVT.  Patient given a dose of metoprolol with improvement in the heart rate.  She will be given fluid boluses as her blood pressure did drop some.  May need additional doses of IV metoprolol the heart rate greater than 130.  Echocardiogram from February showed normal systolic function.  TSH was normal when checked in March.patient remains tachycardic with heart rate in the 160s to 170s, she responded to adenosine push yesterday. Would recommend using adenosine 6 mg IV push if  the patient does not improve with IV metoprolol    Retropharyngeal fluid collection/tender cervical lymphadenopathy Patient was seen by ENT.  The fluid collection apparently has clinically improved.  Neck pain and swelling has also significantly improved.  No ENT intervention is recommended.     History of COPD/asthma Stable.  See above.  Diabetes mellitus type 2 Patient has  had a few episodes of hypoglycemia likely due to infection and poor oral intake.  Continue to monitor closely.  Holding her long-acting insulin for now.  History of chronic diastolic CHF Patient appears to be euvolemic to hypovolemic.  Continue to monitor for now.  Essential hypertension Patient with  borderline blood pressures.  She was noted to be hypotensive and so her metoprolol was discontinued recently.  This could be the reason for her SVT this morning.  Metastatic breast cancer Oncology was notified.  Pancytopenia Patient has chronic thrombocytopenia.  Also has chemotherapy-induced anemia.  Hemoglobin is stable.  Hypokalemia This will be repleted.  Magnesium is normal.  DVT Prophylaxis: SCDs    Code Status:   full code. Family Communication: Discussed with the patient Disposition Plan: Management as outlined above.     LOS: 7 days   Oletta Buehring  [facility] Pager on [email or web address]  05/01/2018, 9:44 AM

## 2018-05-01 NOTE — Progress Notes (Signed)
NAME:  Stacy Shelton, MRN:  [id number], DOB:  1960/02/23, LOS: 7 ADMISSION DATE:  04/24/2018, CONSULTATION DATE:  04/28/18 REFERRING MD:  Dr. Posey Pronto , CHIEF COMPLAINT:  SOB    Brief History   58 y/o F with metastatic breast cancer admitted 3/26 with reports of fever to 101.3 and cough.  She was admitted 3/9-3/18 with bilateral pneumonia and UTI.  Found to have concern for possible retropharyngeal abscess and LLL PNA with effusion.  Underwent left thoracentesis with preliminary fluid showing parapneumonic process (WBC 12k, LDH 256).  Cytology pending.   Past Medical History  Metastatic breast cancer - followed by Dr. Alvy Bimler, last chemo 03/26/18 DM II  Chronic diastolic CHF  HTN  Significant Hospital Events   3/26  Readmit with SOB, fever, cough 3/30  Tx to SDU with increased O2 needs, COVID r/o initiated 3/31  O2 needs increased to 10L HFNC. SVT overnight, treated with IV lopressor & adenosine   4/01  Remains on 10L  4/02  On 11L HFNC, given metoprolol for SVT  Consults:  PCCM 3/30   Procedures:  Left Thoracentesis 3/29   Significant Diagnostic Tests:  CT Soft Tissue Neck 3/27 >> retropharyngeal fluid collection compatible with effusion or possible abscess, soft tissue thickening extends into the right, stranding surrounds the right submandibular gland.  Findings most compatible with infection.  Favor pharyngitis with effusion / abscess, multiple nodular densities in the RUL may be residual PNA vs metastatic disease CTA Chest 3/29 >> neg for PE, large left pleural effusion, 15 mm right paratracheal lymph node enlarged  Micro Data:  RVP 3/27 >> negative  BCx2 3/26 >> negative  R Pleural Fluid 3/29 >>    COVID 3/30 >> negative  Pleural cytology 3/29 >> acute inflammatory, negative for malignant cells  COVID 4/2 >>   Antimicrobials:  Cefepime 3/26 >> 3/27 Vanco 3/26 >> 4/2 (stop date added) Zosyn 3/27 >> 4/2  Interim history/subjective:  SVT (narrow complex tachycardia)  overnight, given IV lopressor with resolution. Great response to diuresis > 3.5L UOP / 5L positive for admit.  States she is not eating much but likes chocolate ensure.    Objective   Blood pressure (!) 117/48, pulse 100, temperature 98.9 F (37.2 C), temperature source Oral, resp. rate (!) 53, height 5\' 3"  (1.6 m), weight 71.3 kg, last menstrual period 10/30/2010, SpO2 92 %.        Intake/Output Summary (Last 24 hours) at 05/01/2018 1131 Last data filed at 05/01/2018 0600 Gross per 24 hour  Intake 1240 ml  Output 3500 ml  Net -2260 ml   Filed Weights   04/25/18 1314 04/29/18 0447 04/30/18 0643  Weight: 66.3 kg 72.2 kg 71.3 kg    Examination: General: chronically ill appearing female lying in bed in NAD  HEENT: MM pink/moist, [place] O2 Neuro: AAOx4, brighter today, laughed on exam, MAE CV: s1s2 rrr, no m/r/g PULM: even/non-labored, diminished left lower  AC:ZYSA, non-tender, bsx4 active  Extremities: warm/dry, 1+ generalized edema  Skin: no rashes or lesions  Resolved Hospital Problem list     Assessment & Plan:   Acute Hypoxemic Respiratory Failure  -in setting of LLL infiltrate, L effusion -increasing O2 need 3/30 from 2 to 6L, had similar admit recently P: Repeat lasix 40 mg IV x1 COVID testing repeated  Wean O2 for sats >90% Follow intermittent CXR  Mobilize as able / pulmonary hygiene   Left Parapneumonic Pleural Effusion  -parapneumonic process  -WBC 12,469 / LDH 256 P: Follow  culture  Suspected Retropharyngeal Abscess Fever  P: ABX as above  Plan for 2 weeks for now  Pancytopenia  P: Trend CBC  Transfuse per ICU guidelines  SCD's only   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, Liver, Brain, Bone P: Per ONC  Best practice:  Diet: As tolerated Pain/Anxiety/Delirium protocol (if indicated): n/a VAP protocol (if indicated):  DVT prophylaxis: SCD's  GI prophylaxis: n/a  Glucose control: SSI  Mobility: as tolerated  Code Status: Full code Family  Communication: Sister, mother updated at bedside 4/2 Disposition: SDU  Labs   CBC: Recent Labs  Lab 04/25/18 0443  04/27/18 0011 04/28/18 0337 04/28/18 1600 04/29/18 0220 04/30/18 0308 05/01/18 0436  WBC 4.9   < > 5.3 3.3*  --  8.6 7.4 7.1  NEUTROABS 3.5  --   --   --   --  6.0 4.9  --   HGB 6.7*   < > 7.4* 6.2* 9.2* 8.0* 8.1* 7.4*  HCT 22.1*   < > 24.3* 20.5* 29.4* 25.7* 27.3* 24.2*  MCV 87.7   < > 88.4 90.7  --  88.9 90.7 89.3  PLT 63*   < > 46* 26*  --  30* 33* 21*   < > = values in this interval not displayed.    Basic Metabolic Panel: Recent Labs  Lab 04/26/18 0351 04/28/18 0337 04/29/18 0220 04/30/18 0308 05/01/18 0436  NA 138 140 137 141 139  K 3.5 3.5 3.5 2.9* 3.0*  CL 104 106 103 102 98  CO2 24 25 25 31 31   GLUCOSE 91 144* 219* 156* 185*  BUN 8 17 18 16 19   CREATININE 0.68 1.00 0.75 0.78 0.70  CALCIUM 8.4* 7.7* 8.1* 8.1* 8.0*  MG  --   --   --  1.8  --    GFR: Estimated Creatinine Clearance: 72.6 mL/min (by C-G formula based on SCr of 0.7 mg/dL). Recent Labs  Lab 04/24/18 1754  04/27/18 0011 04/27/18 0430 04/28/18 0337 04/29/18 0220 04/30/18 0308 05/01/18 0436  PROCALCITON 0.38  --   --   --   --  15.70  --   --   WBC  --    < > 5.3  --  3.3* 8.6 7.4 7.1  LATICACIDVEN 0.7  --  1.8 1.5  --   --   --   --    < > = values in this interval not displayed.    Liver Function Tests: Recent Labs  Lab 04/28/18 0337 04/29/18 0220  AST 27 24  ALT 24 22  ALKPHOS 162* 237*  BILITOT 2.6* 3.6*  PROT 5.5* 5.9*  ALBUMIN 1.8* 1.8*   No results for input(s): LIPASE, AMYLASE in the last 168 hours. No results for input(s): AMMONIA in the last 168 hours.  ABG    Component Value Date/Time   PHART 7.449 04/29/2018 0525   PCO2ART 38.8 04/29/2018 0525   PO2ART 84.2 04/29/2018 0525   HCO3 26.5 04/29/2018 0525   ACIDBASEDEF 6.1 (H) 04/08/2018 1547   O2SAT 96.7 04/29/2018 0525     Coagulation Profile: Recent Labs  Lab 04/26/18 0351  INR 1.3*     Cardiac Enzymes: No results for input(s): CKTOTAL, CKMB, CKMBINDEX, TROPONINI in the last 168 hours.  HbA1C: Hgb A1c MFr Bld  Date/Time Value Ref Range Status  04/08/2018 02:16 AM 7.5 (H) 4.8 - 5.6 % Final    Comment:    (NOTE) Pre diabetes:          5.7%-6.4%  Diabetes:              >6.4% Glycemic control for   <7.0% adults with diabetes   03/10/2018 11:50 PM 12.9 (H) 4.8 - 5.6 % Final    Comment:    (NOTE) Pre diabetes:          5.7%-6.4% Diabetes:              >6.4% Glycemic control for   <7.0% adults with diabetes     CBG: Recent Labs  Lab 04/29/18 2004 04/30/18 0958 04/30/18 1227 04/30/18 1812 05/01/18 0759  GLUCAP 105* 191* 155* 225* 166*    Critical care time: n/a    Noe Gens, NP-C [facility] Pgr: [phone] or if no answer [phone] 05/01/2018, 11:31 AM

## 2018-05-01 NOTE — Progress Notes (Signed)
    [facility]   Reason for visit: Follow up on respiratory distress  Interval History: COVID test negative, repeat testing sent by Dr. Allyson Sabal.  Worsening respiratory status.  CXR with worsening bilateral airspace disease.   Physical Exam: Constitutional:  Vitals:   05/01/18 1300 05/01/18 1305  BP: (!) 59/46 (!) 90/47  Pulse: 87 89  Resp: (!) 35 (!) 21  Temp:    SpO2: 98% 97%    Impression: possible COVID vs bacterial pharyngeal infection.   Plan: 1.  Await repeat testing 2. Continue antibiotics.

## 2018-05-01 NOTE — Plan of Care (Signed)
Discussed with patient plan of care for the evening, pain management and ways to help her to improve her taking medications with some teach back displayed

## 2018-05-02 DIAGNOSIS — R502 Drug induced fever: Secondary | ICD-10-CM

## 2018-05-02 DIAGNOSIS — R0902 Hypoxemia: Secondary | ICD-10-CM

## 2018-05-02 DIAGNOSIS — J181 Lobar pneumonia, unspecified organism: Secondary | ICD-10-CM

## 2018-05-02 DIAGNOSIS — J9601 Acute respiratory failure with hypoxia: Secondary | ICD-10-CM

## 2018-05-02 LAB — CULTURE, BODY FLUID W GRAM STAIN -BOTTLE: Culture: NO GROWTH

## 2018-05-02 LAB — COMPREHENSIVE METABOLIC PANEL
ALT: 13 U/L (ref 0–44)
AST: 25 U/L (ref 15–41)
Albumin: 1.8 g/dL — ABNORMAL LOW (ref 3.5–5.0)
Alkaline Phosphatase: 196 U/L — ABNORMAL HIGH (ref 38–126)
Anion gap: 9 (ref 5–15)
BUN: 24 mg/dL — ABNORMAL HIGH (ref 6–20)
CO2: 32 mmol/L (ref 22–32)
Calcium: 8.1 mg/dL — ABNORMAL LOW (ref 8.9–10.3)
Chloride: 96 mmol/L — ABNORMAL LOW (ref 98–111)
Creatinine, Ser: 0.69 mg/dL (ref 0.44–1.00)
GFR calc Af Amer: >60 mL/min (ref >60)
GFR calc non Af Amer: >60 mL/min (ref >60)
Glucose, Bld: 297 mg/dL — ABNORMAL HIGH (ref 70–99)
Potassium: 3.9 mmol/L (ref 3.5–5.1)
Sodium: 137 mmol/L (ref 135–145)
Total Bilirubin: 3.4 mg/dL — ABNORMAL HIGH (ref 0.3–1.2)
Total Protein: 5.9 g/dL — ABNORMAL LOW (ref 6.5–8.1)

## 2018-05-02 LAB — CBC WITH DIFFERENTIAL/PLATELET
Abs Immature Granulocytes: 0.1 10*3/uL — ABNORMAL HIGH (ref 0.00–0.07)
Band Neutrophils: 2 %
Basophils Absolute: 0.6 10*3/uL — ABNORMAL HIGH (ref 0.0–0.1)
Basophils Relative: 4 %
Eosinophils Absolute: 0.1 10*3/uL (ref 0.0–0.5)
Eosinophils Relative: 1 %
HCT: 27.1 % — ABNORMAL LOW (ref 36.0–46.0)
Hemoglobin: 8.5 g/dL — ABNORMAL LOW (ref 12.0–15.0)
Lymphocytes Relative: 22 %
Lymphs Abs: 3.1 10*3/uL (ref 0.7–4.0)
MCH: 28 pg (ref 26.0–34.0)
MCHC: 31.4 g/dL (ref 30.0–36.0)
MCV: 89.1 fL (ref 80.0–100.0)
Monocytes Absolute: 0.7 10*3/uL (ref 0.1–1.0)
Monocytes Relative: 5 %
Myelocytes: 1 %
Neutro Abs: 9.6 10*3/uL — ABNORMAL HIGH (ref 1.7–7.7)
Neutrophils Relative %: 65 %
Platelets: 25 10*3/uL — CL (ref 150–400)
RBC: 3.04 MIL/uL — ABNORMAL LOW (ref 3.87–5.11)
RDW: 19.5 % — ABNORMAL HIGH (ref 11.5–15.5)
WBC: 14.3 10*3/uL — ABNORMAL HIGH (ref 4.0–10.5)
nRBC: 0 % (ref 0.0–0.2)

## 2018-05-02 LAB — GLUCOSE, CAPILLARY
Glucose-Capillary: 237 mg/dL — ABNORMAL HIGH (ref 70–99)
Glucose-Capillary: 200 mg/dL — ABNORMAL HIGH (ref 70–99)

## 2018-05-02 LAB — CULTURE, BODY FLUID-BOTTLE

## 2018-05-02 LAB — MAGNESIUM: Magnesium: 1.8 mg/dL (ref 1.7–2.4)

## 2018-05-02 MED ORDER — ADENOSINE 6 MG/2ML IV SOLN
INTRAVENOUS | Status: AC
  Administered 2018-05-02: 6 mg
  Filled 2018-05-02: qty 8

## 2018-05-02 MED ORDER — LORAZEPAM 2 MG/ML IJ SOLN
0.5000 mg | Freq: Once | INTRAMUSCULAR | Status: AC
  Administered 2018-05-02: 0.5 mg via INTRAVENOUS
  Filled 2018-05-02: qty 1

## 2018-05-02 MED ORDER — MAGNESIUM OXIDE 400 (241.3 MG) MG PO TABS
400.0000 mg | ORAL_TABLET | Freq: Every day | ORAL | Status: DC
  Administered 2018-05-03 – 2018-05-06 (×4): 400 mg via ORAL
  Filled 2018-05-02 (×5): qty 1

## 2018-05-02 MED ORDER — SODIUM CHLORIDE 0.9 % IV BOLUS
250.0000 mL | Freq: Once | INTRAVENOUS | Status: AC
  Administered 2018-05-02: 250 mL via INTRAVENOUS

## 2018-05-02 MED ORDER — POTASSIUM CHLORIDE 20 MEQ/15ML (10%) PO SOLN
40.0000 meq | Freq: Every day | ORAL | Status: DC
  Administered 2018-05-02 – 2018-05-06 (×5): 40 meq via ORAL
  Filled 2018-05-02 (×5): qty 30

## 2018-05-02 MED ORDER — DIGOXIN 0.25 MG/ML IJ SOLN
0.5000 mg | Freq: Once | INTRAMUSCULAR | Status: AC
  Administered 2018-05-02: 0.5 mg via INTRAVENOUS
  Filled 2018-05-02: qty 2

## 2018-05-02 MED ORDER — SODIUM CHLORIDE 0.9 % IV BOLUS: 500.0000 mL | Freq: Once | INTRAVENOUS | Status: DC

## 2018-05-02 MED ORDER — MAGNESIUM SULFATE 2 GM/50ML IV SOLN
2.0000 g | Freq: Once | INTRAVENOUS | Status: AC
  Administered 2018-05-02: 06:00:00 2 g via INTRAVENOUS
  Filled 2018-05-02: qty 50

## 2018-05-02 NOTE — Progress Notes (Addendum)
[facility] PROGRESS NOTE  REMY DIA BZJ:[id number] DOB: [birth year] DOA: 04/24/2018  PCP: Robyne Peers, MD  Brief History/Interval Summary: 58 year old with past medical history relevant for metastatic breast cancer, type 2 diabetes, chronic diastolic heart failure, hypertension who was initially hospitalized from 04/07/2018 to 04/16/2018 for septic shock thought to be secondary to pneumonia who presented to the emergency department with recurrent fevers and cough and found to have retropharyngeal fluid collection with extension of inflammation around right carotid.  Her course has been complicated by acute hypoxic respiratory failure with left pleural effusion.   Consultants:   Oncology, Dr. Alvy Bimler  ENT  Interventional radiology  Procedures: None  Antibiotics: Vancomycin and Zosyn  Subjective/Interval History: Continues to be on 11 L oxygen flow Was tachy last night, reequired, iv dig,iv metoprolol , ativan and iv adenosine to slow her down, HR in 99 this am at rest      ROS: Denies any headaches  Objective:  Vital Signs  Vitals:   05/02/18 0700 05/02/18 0800 05/02/18 0900 05/02/18 1000  BP: (!) 101/35 (!) 101/39 (!) 99/34 (!) 106/37  Pulse: 97 96 99 99  Resp: (!) 28 (!) 32 (!) 35 (!) 32  Temp:   97.9 F (36.6 C)   TempSrc:   Oral   SpO2: 100% 100% 100% 100%  Weight:      Height:        Intake/Output Summary (Last 24 hours) at 05/02/2018 1047 Last data filed at 05/02/2018 0600 Gross per 24 hour  Intake 1183.54 ml  Output 1950 ml  Net -766.46 ml   Filed Weights   04/29/18 0447 04/30/18 0643 05/02/18 0418  Weight: 72.2 kg 71.3 kg 69.3 kg    General appearance: alert, cooperative, appears stated age and no distress Head: Normocephalic, without obvious abnormality, atraumatic Resp: Noted to be tachypneic.  Coarse breath sounds bilaterally.  Crackles at the bases.  Few rhonchi on the left. Cardio: S1-S2 is tachycardic regular.  No S3-S4.  No rubs or  bruit. GI: soft, non-tender; bowel sounds normal; no masses,  no organomegaly Extremities: extremities normal, atraumatic, no cyanosis or edema Pulses: 2+ and symmetric Neurologic: No focal neurological deficits  Lab Results:  Data Reviewed: I have personally reviewed following labs and imaging studies  CBC: Recent Labs  Lab 04/28/18 0337 04/28/18 1600 04/29/18 0220 04/30/18 0308 05/01/18 0436 05/02/18 0100  WBC 3.3*  --  8.6 7.4 7.1 14.3*  NEUTROABS  --   --  6.0 4.9  --  9.6*  HGB 6.2* 9.2* 8.0* 8.1* 7.4* 8.5*  HCT 20.5* 29.4* 25.7* 27.3* 24.2* 27.1*  MCV 90.7  --  88.9 90.7 89.3 89.1  PLT 26*  --  30* 33* 21* 25*    Basic Metabolic Panel: Recent Labs  Lab 04/28/18 0337 04/29/18 0220 04/30/18 0308 05/01/18 0436 05/02/18 0100  NA 140 137 141 139 137  K 3.5 3.5 2.9* 3.0* 3.9  CL 106 103 102 98 96*  CO2 25 25 31 31  32  GLUCOSE 144* 219* 156* 185* 297*  BUN 17 18 16 19  24*  CREATININE 1.00 0.75 0.78 0.70 0.69  CALCIUM 7.7* 8.1* 8.1* 8.0* 8.1*  MG  --   --  1.8  --  1.8    GFR: Estimated Creatinine Clearance: 71.6 mL/min (by C-G formula based on SCr of 0.69 mg/dL).  Liver Function Tests: Recent Labs  Lab 04/28/18 0337 04/29/18 0220 05/02/18 0100  AST 27 24 25   ALT 24 22 13  ALKPHOS 162* 237* 196*  BILITOT 2.6* 3.6* 3.4*  PROT 5.5* 5.9* 5.9*  ALBUMIN 1.8* 1.8* 1.8*    Coagulation Profile: Recent Labs  Lab 04/26/18 0351  INR 1.3*    CBG: Recent Labs  Lab 04/30/18 0958 04/30/18 1227 04/30/18 1812 05/01/18 0759 05/02/18 0832  GLUCAP 191* 155* 225* 166* 237*    Anemia Panel: No results for input(s): VITAMINB12, FOLATE, FERRITIN, TIBC, IRON, RETICCTPCT in the last 72 hours.  Recent Results (from the past 240 hour(s))  Blood Culture (routine x 2)     Status: None   Collection Time: 04/24/18  9:30 AM  Result Value Ref Range Status   Specimen Description   Final    BLOOD RIGHT CHEST Performed at [facility]  [address] [place], [place] [address]    Special Requests   Final    BOTTLES DRAWN AEROBIC AND ANAEROBIC Blood Culture adequate volume Performed at [facility] [address], [place], [place] [address]    Culture   Final    NO GROWTH 5 DAYS Performed at [facility], [place] [address] [place], [place] [address]    Report Status 04/29/2018 FINAL  Final  Blood Culture (routine x 2)     Status: None   Collection Time: 04/24/18  9:30 AM  Result Value Ref Range Status   Specimen Description   Final    BLOOD RIGHT ANTECUBITAL Performed at [facility] [address], [place], [place] [address]    Special Requests   Final    BOTTLES DRAWN AEROBIC AND ANAEROBIC Blood Culture adequate volume Performed at [facility] [address] [place], [place] [address]    Culture   Final    NO GROWTH 5 DAYS Performed at [facility], [place] [address], [place], [place] [address]    Report Status 04/29/2018 FINAL  Final  Respiratory Panel by PCR     Status: None   Collection Time: 04/25/18 11:52 AM  Result Value Ref Range Status   Adenovirus NOT DETECTED NOT DETECTED Final   Coronavirus 229E NOT DETECTED NOT DETECTED Final    Comment: (NOTE) The Coronavirus on the Respiratory Panel, DOES NOT test for the novel  Coronavirus (2019 nCoV)    Coronavirus HKU1 NOT DETECTED NOT DETECTED Final   Coronavirus NL63 NOT DETECTED NOT DETECTED Final   Coronavirus OC43 NOT DETECTED NOT DETECTED Final   Metapneumovirus NOT DETECTED NOT DETECTED Final   Rhinovirus / Enterovirus NOT DETECTED NOT DETECTED Final   Influenza A NOT DETECTED NOT DETECTED Final   Influenza B NOT DETECTED NOT DETECTED Final   Parainfluenza Virus 1 NOT DETECTED NOT DETECTED Final   Parainfluenza Virus 2 NOT DETECTED NOT DETECTED Final   Parainfluenza Virus 3 NOT DETECTED NOT DETECTED Final   Parainfluenza Virus 4 NOT DETECTED NOT DETECTED Final   Respiratory  Syncytial Virus NOT DETECTED NOT DETECTED Final   Bordetella pertussis NOT DETECTED NOT DETECTED Final   Chlamydophila pneumoniae NOT DETECTED NOT DETECTED Final   Mycoplasma pneumoniae NOT DETECTED NOT DETECTED Final    Comment: Performed at [facility], [place] [address], [place], [place] [address]  Culture, body fluid-bottle     Status: None (Preliminary result)   Collection Time: 04/27/18  9:50 AM  Result Value Ref Range Status   Specimen Description PLEURAL LEFT  Final   Special Requests NONE  Final   Culture   Final  NO GROWTH 4 DAYS Performed at [facility], [place] [address] [place], [place] [address]    Report Status PENDING  Incomplete  Gram stain     Status: None   Collection Time: 04/27/18  9:50 AM  Result Value Ref Range Status   Specimen Description PLEURAL LEFT  Final   Special Requests NONE  Final   Gram Stain   Final    MODERATE WBC PRESENT,BOTH PMN AND MONONUCLEAR NO ORGANISMS SEEN Performed at [facility], [address] [place], [place] [address]    Report Status 04/27/2018 FINAL  Final  Novel Coronavirus, NAA (hospital order; send-out to ref lab)     Status: None   Collection Time: 04/28/18  9:26 AM  Result Value Ref Range Status   SARS-CoV-2, NAA NOT DETECTED NOT DETECTED Final    Comment: Negative (Not Detected) results do not exclude infection caused by SARS CoV 2 and should not be used as the sole basis for treatment or other patient management decisions. Optimum specimen types and timing for peak viral levels during infections caused  by SARS CoV 2 have not been determined. Collection of multiple specimens (types and time points) from the same patient may be necessary to detect the virus. Improper specimen collection and handling, sequence variability underlying assay primers and or probes, or the presence of organisms in  quantities less than the limit of detection of the assay may lead to false negative results. Positive and negative  predictive values of testing are highly dependent on prevalence. False negative results are more likely when prevalence of disease is high. (NOTE) The expected result is Negative (Not Detected). The SARS CoV 2 test is intended for the presumptive qualitative  detection of nucleic acid from SARS CoV 2 in upper and lower  respir atory specimens. Testing methodology is real time RT PCR. Test results must be correlated with clinical presentation and  evaluated in the context of other laboratory and epidemiologic data.  Test performance can be affected because the epidemiology and  clinical spectrum of infection caused by SARS CoV 2 is not fully  known. For example, the optimum types of specimens to collect and  when during the course of infection these specimens are most likely  to contain detectable viral RNA may not be known. This test has not been Food and Drug Administration (FDA) cleared or  approved and has been authorized by FDA under an Emergency Use  Authorization (EUA). The test is only authorized for the duration of  the declaration that circumstances exist justifying the authorization  of emergency use of in vitro diagnostic tests for detection and or  diagnosis of SARS CoV 2 under Section 564(b)(1) of the Act, 21 U.S.C.  section 2097378848 3(b)(1), unless the authorization is terminated or   revoked sooner. [facility] is certified under the  Clinical Laboratory Improvement Amendments of 1988 (CLIA), 42 U.S.C.  section [phone], to perform high complexity tests. Performed at [facility] [id number], [place], [place] [address] Laboratory Director: Loleta Books, MD Performed at [facility], [place] [address], [place], [place] [address]    Coronavirus Source NASOPHARYNGEAL  Final    Comment: Performed at [facility], [place] [address] [place], [place] [address]      Radiology Studies:  Dg Chest Port 1 View  Result Date: 05/01/2018 CLINICAL DATA:  Respiratory failure EXAM: PORTABLE CHEST 1 VIEW COMPARISON:  04/29/2018 FINDINGS: Right Port-A-Cath remains in place, unchanged. Bilateral airspace disease, worsening on the right since prior study. Layering effusions noted. Heart is borderline in size. IMPRESSION: Bilateral airspace opacities, worsening on the right since prior study. Bilateral layering effusions. Electronically Signed   By: Rolm Baptise M.D.   On: 05/01/2018 07:21     Medications:  Scheduled: . chlorhexidine  15 mL Mouth Rinse BID  . Chlorhexidine Gluconate Cloth  6 each Topical Daily  . chlorpheniramine-HYDROcodone  5 mL Oral Q12H  . dextromethorphan-guaiFENesin  1 tablet Oral BID  . feeding supplement (ENSURE ENLIVE)  237 mL Oral Q24H  . feeding supplement (PRO-STAT SUGAR FREE 64)  30 mL Oral Daily  . insulin aspart  0-15 Units Subcutaneous TID WC  . mouth rinse  15 mL Mouth Rinse BID  . mouth rinse  15 mL Mouth Rinse q12n4p  . metoprolol tartrate  50 mg Oral BID  . montelukast  10 mg Oral Daily  . multivitamin with minerals  1 tablet Oral Daily  . pantoprazole  40 mg Oral BID  . potassium chloride  40 mEq Oral Daily   Continuous: . piperacillin-tazobactam (ZOSYN)  IV 3.375 g (05/02/18 0824)  . vancomycin 750 mg (05/02/18 1001)   JAS:[id number], alum & mag hydroxide-simeth, cyclobenzaprine, metoprolol tartrate, ondansetron (ZOFRAN) IV, sodium chloride flush    Assessment/Plan:  Date: 05/02/2018  Patient Isolation: Airborne+Contact  HCP PPE: Wearing all recommended PPE N95 mask + eye protection+ Gown + Gloves+ SurgicalCap Patient PPE: None   Acute respiratory failure with hypoxia Patient recently treated for pneumonia in the beginning of March.  Chest x-ray on admission actually showed improvement .  On 3/28 patient became acutely hypoxic and developed a fever.  Chest x-ray showed layering pleural effusion.  120 cc of fluid removed with the  thoracentesis.  Cytology has been negative.  Cultures have been negative so far.  Fluid was thought to be exudative in nature.  Patient was continued on IV antibiotics.  She is currently on vancomycin and Zosyn.  Discussed with infectious disease. COVID test negative, although high pretest probability and therefore test will be repeated . Her clinical course is strongly suggestive of covid 19. Patient remains on airborne plus contact precautions.  Influenza and respiratory viral pathogen panel was negative.  Patient remains on HFNC.10 L, will remain in stepdown because of increasing oxygen requirements, tachycardia, she is also currently a full code.  Will attempt to wean oxygen if able to keep oxygen saturation greater than 94%, up in the chair, ambulate with assistance as tolerated  Supraventricular tachycardia EKG shows SVT.  Appears to be regular rhythm.  No previous history of arrhythmias.  EKG from yesterday morning also showed SVT.  Patient given a dose of metoprolol with improvement in the heart rate.  She will be given fluid boluses as her blood pressure did drop some.  May need additional doses of IV metoprolol the heart rate greater than 130.  Echocardiogram from February showed normal systolic function.  TSH was normal when checked in March.patient became tachycardic last night with heart rate in the 160s to 170s, she received 1 dose of digoxin 0.5 mg IV, Lopressor 5 mg IV as well as adenosine 6 mg IV.  Tachycardia resolved with IV adenosine.  Heart rate in the 90s this morning at rest.continue to replete electrolytes   Retropharyngeal fluid collection/tender cervical lymphadenopathy Patient was seen by ENT.  The fluid collection apparently has clinically improved.  Neck pain and swelling has also  significantly improved.  No ENT intervention is recommended.  ENT has signed off  History of COPD/asthma Stable.  See above.  Diabetes mellitus type 2 Patient has had a few episodes of hypoglycemia  likely due to infection and poor oral intake.  Continue to monitor closely.  Resume Levemir at a lower dose.  History of chronic diastolic CHF Patient appears to be euvolemic to hypovolemic.  Continue to monitor for now.  Essential hypertension Patient with borderline blood pressures.  She was noted to be hypotensive and so her metoprolol was discontinued recently.  This could be the reason for her SVT this morning.  Metastatic breast cancer Oncology was notified.  Pancytopenia Patient has chronic thrombocytopenia.  Also has chemotherapy-induced anemia.  Hemoglobin is stable.  Hypokalemia Continue to maintain potassium greater than 4 and magnesium greater than 2  DVT Prophylaxis: SCDs    Code Status:   full code. Family Communication: Discussed with the patient, patient's mother and sister Disposition Plan: Continue stepdown, mobilize, wean oxygen     LOS: 8 days   Shirlette Scarber  [facility] operational officer on [email or web address]  05/02/2018, 10:47 AM

## 2018-05-02 NOTE — Progress Notes (Addendum)
Shift event note: Notified by RN "Tracie" just before midnight regarding pt again in SVT. Confirmed by EKG w/ rate in the 150's. BP very soft at the time (74/52) so Digoxin 0.5 mg ordered IV w/ a small IVFB. Pt denies CP though admitted she could feel her heart race. RR- rate up slightly from her baseline of 20-30's to 40's on 10L HFNC. 02 sats WNL. Ativan 0.5 mg ordered. With improvement in BP pt given Lopressor 5 mg IV as HR still in 140's. Minimal improvement in HR. Record reviewed. MD note suggest Adenosine for any sustained rate above 130's after Lopressor. Pt remained in NAD despite persistent SVT/ST. While on the unit seeing other pt's order given for Adenosine 6 mg IV.  Assessment/Plan: 1. Recurrent SVT: Asymptomatic. Resolved w/ IV Adenosine. Pt tolerated well. Appears to be regular rhythm.  Remains tachycardic (106-116). No previous history of arrhythmias.  EKG from yesterday morning also showed SVT. Given fluid boluses as her blood pressure did drop some. Will continue to monitor closely on SDU.  Jeryl Columbia, NP-C [facility] Pager [phone]

## 2018-05-02 NOTE — Progress Notes (Signed)
Subjective: Resting in bed. Reports no more neck pain, neck swelling, or swallowing difficulty.  Objective: Vital signs in last 24 hours: Temp:  [97.9 F (36.6 C)-98.8 F (37.1 C)] 97.9 F (36.6 C) (04/03 0900) Pulse Rate:  [85-155] 99 (04/03 1000) Resp:  [19-51] 32 (04/03 1000) BP: (59-134)/(34-84) 106/37 (04/03 1000) SpO2:  [94 %-100 %] 100 % (04/03 1000) Weight:  [69.3 kg] 69.3 kg (04/03 0418)  Physical Exam Constitutional: patient awake and alert. NAD. Eyes:Pupils are equal, round, reactive to light. Extraocular motion is intact.  Ears: Examination of the ears shows normal auricles and external auditory canals bilaterally.  Nose: Nasal examination shows normal mucosa, septum, turbinates.  Face: Facial examination shows no asymmetry. Palpation of the face elicit no significant tenderness.  Mouth: Oral cavity examination shows no mucosalabnormality. No significant trismus is noted.No purulent drainage. Neck:Palpation of the neck reveals resolution of her LAD and tenderness.The trachea is midline. The thyroid is not significantly enlarged.  Neuro: Cranial nerves 2-12 are all grossly in tact. Pulmonary:No stridor. No acute distress. Skin: Skin is warm.  Recent Labs    05/01/18 0436 05/02/18 0100  WBC 7.1 14.3*  HGB 7.4* 8.5*  HCT 24.2* 27.1*  PLT 21* 25*   Recent Labs    05/01/18 0436 05/02/18 0100  NA 139 137  K 3.0* 3.9  CL 98 96*  CO2 31 32  GLUCOSE 185* 297*  BUN 19 24*  CREATININE 0.70 0.69  CALCIUM 8.0* 8.1*    Medications:  I have reviewed the patient's current medications. Scheduled: . chlorhexidine  15 mL Mouth Rinse BID  . Chlorhexidine Gluconate Cloth  6 each Topical Daily  . chlorpheniramine-HYDROcodone  5 mL Oral Q12H  . dextromethorphan-guaiFENesin  1 tablet Oral BID  . feeding supplement (ENSURE ENLIVE)  237 mL Oral Q24H  . feeding supplement (PRO-STAT SUGAR FREE 64)  30 mL Oral Daily  . insulin aspart  0-15 Units Subcutaneous TID WC   . mouth rinse  15 mL Mouth Rinse BID  . mouth rinse  15 mL Mouth Rinse q12n4p  . metoprolol tartrate  50 mg Oral BID  . montelukast  10 mg Oral Daily  . multivitamin with minerals  1 tablet Oral Daily  . pantoprazole  40 mg Oral BID  . potassium chloride  40 mEq Oral Daily   Continuous: . piperacillin-tazobactam (ZOSYN)  IV 3.375 g (05/02/18 0824)  . vancomycin 750 mg (05/02/18 1001)    Assessment/Plan: Retropharyngeal effusion has clinically improved. Her neck painand swelling have essentially resolved. Afebrile the last 24 hours. -COVID-19 from last week was negative. Re-test pending. - No ENT intervention is recommended as this time. -Other treatment per ID and CCM. Her issues are mostly cardiopulmonary at this time. - Will sign off for now. Please call with any questions or new developments.   LOS: 8 days   Stacy Shelton Stacy Shelton 05/02/2018, 10:19 AM

## 2018-05-02 NOTE — Progress Notes (Signed)
NAME:  Stacy Shelton, MRN:  [id number], DOB:  Oct 12, 1960, LOS: 8 ADMISSION DATE:  04/24/2018, CONSULTATION DATE:  04/28/18 REFERRING MD:  Dr. Posey Pronto , CHIEF COMPLAINT:  SOB    Brief History   58 y/o F with metastatic breast cancer admitted 3/26 with reports of fever to 101.3 and cough.  She was admitted 3/9-3/18 with bilateral pneumonia and UTI.  Found to have concern for possible retropharyngeal abscess and LLL PNA with effusion.  Underwent left thoracentesis with preliminary fluid showing parapneumonic process (WBC 12k, LDH 256).  Cytology pending.   Past Medical History  Metastatic breast cancer - followed by Dr. Alvy Bimler, last chemo 03/26/18 DM II  Chronic diastolic CHF  HTN  Significant Hospital Events   3/26  Readmit with SOB, fever, cough 3/30  Tx to SDU with increased O2 needs, COVID r/o initiated 3/31  O2 needs increased to 10L HFNC. SVT overnight, treated with IV lopressor & adenosine   4/01  Remains on 10L  4/02  On 11L HFNC, given metoprolol for SVT with resolution   Consults:  PCCM 3/30   Procedures:  Left Thoracentesis 3/29   Significant Diagnostic Tests:  CT Soft Tissue Neck 3/27 >> retropharyngeal fluid collection compatible with effusion or possible abscess, soft tissue thickening extends into the right, stranding surrounds the right submandibular gland.  Findings most compatible with infection.  Favor pharyngitis with effusion / abscess, multiple nodular densities in the RUL may be residual PNA vs metastatic disease CTA Chest 3/29 >> neg for PE, large left pleural effusion, 15 mm right paratracheal lymph node enlarged  Micro Data:  RVP 3/27 >> negative  BCx2 3/26 >> negative  R Pleural Fluid 3/29 >> negative COVID 3/30 >> negative  Pleural cytology 3/29 >> acute inflammatory, negative for malignant cells  COVID 4/2 >>   Antimicrobials:  Cefepime 3/26 >> 3/27 Vanco 3/26 >> 4/2 (stop date added) Zosyn 3/27 >> 4/2  Interim history/subjective:  RN reports attempts  at weaning to 8L O2.  Pt sleeping.    Objective   Blood pressure (!) 106/37, pulse 99, temperature 97.9 F (36.6 C), temperature source Oral, resp. rate (!) 32, height 5\' 3"  (1.6 m), weight 69.3 kg, last menstrual period 10/30/2010, SpO2 100 %.        Intake/Output Summary (Last 24 hours) at 05/02/2018 1154 Last data filed at 05/02/2018 0600 Gross per 24 hour  Intake 1183.54 ml  Output 1950 ml  Net -766.46 ml   Filed Weights   04/29/18 0447 04/30/18 0643 05/02/18 0418  Weight: 72.2 kg 71.3 kg 69.3 kg    Examination: General: chronically ill appearing female lying in bed HEENT: MM pink/moist, no jvd, scarf covering head Neuro: Awakens, drifted back to sleep CV: s1s2 rrr, no m/r/g PULM: even/non-labored, diminished left base  WU:JWJX, non-tender, bsx4 active  Extremities: warm/dry, no edema  Skin: no rashes or lesions  Resolved Hospital Problem list     Assessment & Plan:   Acute Hypoxemic Respiratory Failure  -in setting of LLL infiltrate, L effusion -increasing O2 need 3/30 from 2 to 6L, had similar admit recently P: Hold lasix 4/3  Await repeat COVID testing  Wean O2 for sats > 90% Follow CXR intermittently  Mobilize as able  Left Parapneumonic Pleural Effusion  -parapneumonic process  -WBC 12,469 / LDH 256 P: ABX as below   Suspected Retropharyngeal Abscess Fever  P: Continue abx for 2 weeks, D9/14  Pancytopenia  P: Trend CBC  Transfuse per ICU guidelines  SCD's   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 Please call if changes in clinical status.   Labs    CBC: Recent Labs  Lab 04/28/18 0337 04/28/18 1600 04/29/18 0220 04/30/18 0308 05/01/18 0436 05/02/18 0100  WBC 3.3*  --  8.6 7.4 7.1 14.3*  NEUTROABS  --   --  6.0 4.9  --  9.6*  HGB 6.2* 9.2* 8.0* 8.1* 7.4* 8.5*  HCT 20.5* 29.4* 25.7* 27.3* 24.2* 27.1*  MCV 90.7  --  88.9 90.7 89.3 89.1  PLT 26*  --  30* 33* 21* 25*    Basic Metabolic Panel: Recent Labs  Lab 04/28/18 0337 04/29/18 0220 04/30/18 0308 05/01/18 0436 05/02/18 0100  NA 140 137 141 139 137  K 3.5 3.5 2.9* 3.0* 3.9  CL 106 103 102 98 96*  CO2 25 25 31 31  32  GLUCOSE 144* 219* 156* 185* 297*  BUN 17 18 16 19  24*  CREATININE 1.00 0.75 0.78 0.70 0.69  CALCIUM 7.7* 8.1* 8.1* 8.0* 8.1*  MG  --   --  1.8  --  1.8   GFR: Estimated Creatinine Clearance: 71.6 mL/min (by C-G formula based on SCr of 0.69 mg/dL). Recent Labs  Lab 04/27/18 0011 04/27/18 0430  04/29/18 0220 04/30/18 0308 05/01/18 0436 05/02/18 0100  PROCALCITON  --   --   --  15.70  --   --   --   WBC 5.3  --    < > 8.6 7.4 7.1 14.3*  LATICACIDVEN 1.8 1.5  --   --   --   --   --    < > = values in this interval not displayed.    Liver Function Tests: Recent Labs  Lab 04/28/18 0337 04/29/18 0220 05/02/18 0100  AST 27 24 25   ALT 24 22 13   ALKPHOS 162* 237* 196*  BILITOT 2.6* 3.6* 3.4*  PROT 5.5* 5.9* 5.9*  ALBUMIN 1.8* 1.8* 1.8*   No results for input(s): LIPASE, AMYLASE in the last 168 hours. No results for input(s): AMMONIA in the last 168 hours.  ABG    Component Value Date/Time   PHART 7.449 04/29/2018 0525   PCO2ART 38.8 04/29/2018 0525   PO2ART 84.2 04/29/2018 0525   HCO3 26.5 04/29/2018 0525   ACIDBASEDEF 6.1 (H) 04/08/2018 1547   O2SAT 96.7 04/29/2018 0525     Coagulation Profile: Recent Labs  Lab 04/26/18 0351  INR 1.3*    Cardiac Enzymes: No results for input(s): CKTOTAL, CKMB, CKMBINDEX, TROPONINI in the last 168 hours.  HbA1C: Hgb A1c MFr Bld  Date/Time Value Ref Range Status  04/08/2018 02:16 AM 7.5  (H) 4.8 - 5.6 % Final    Comment:    (NOTE) Pre diabetes:          5.7%-6.4% Diabetes:              >6.4% Glycemic control for   <7.0% adults with diabetes   03/10/2018 11:50 PM 12.9 (H) 4.8 - 5.6 % Final    Comment:    (NOTE) Pre diabetes:          5.7%-6.4% Diabetes:              >  6.4% Glycemic control for   <7.0% adults with diabetes     CBG: Recent Labs  Lab 04/30/18 0958 04/30/18 1227 04/30/18 1812 05/01/18 0759 05/02/18 0832  GLUCAP 191* 155* 225* 166* 237*    Critical care time: n/a    Noe Gens, NP-C [facility] Pgr: [phone] or if no answer [phone] 05/02/2018, 11:54 AM

## 2018-05-02 NOTE — Progress Notes (Addendum)
Patient went into SVT just prior to midnight.  The patient stated her heart was racing but denies chest pressure or tightness.  Patient given lopressor 5 mg via IV for sustained heart rate 150's with an 12 lead EKG obtained to verify.  On-call MD paged since blood pressure was soft prior to bedtime medication hold of lopressor 50 mg via PO.  New orders given for NS 250 ml bolus, digoxin, and ativan 0.5 mg all via IV with very little results at this time.  Waiting for AM lab draws to come back and re-page the MD and Charge RN made aware.  Adenosine 6 mg given by IV with on-call MD and charge RN present.  Patient recovered with no obvious side effects.  Patient current heart rate 95-100's and resting with eyes closed at this time.

## 2018-05-03 DIAGNOSIS — R221 Localized swelling, mass and lump, neck: Secondary | ICD-10-CM

## 2018-05-03 DIAGNOSIS — J918 Pleural effusion in other conditions classified elsewhere: Secondary | ICD-10-CM

## 2018-05-03 DIAGNOSIS — J9690 Respiratory failure, unspecified, unspecified whether with hypoxia or hypercapnia: Secondary | ICD-10-CM

## 2018-05-03 LAB — GLUCOSE, CAPILLARY
Glucose-Capillary: 245 mg/dL — ABNORMAL HIGH (ref 70–99)
Glucose-Capillary: 243 mg/dL — ABNORMAL HIGH (ref 70–99)
Glucose-Capillary: 153 mg/dL — ABNORMAL HIGH (ref 70–99)

## 2018-05-03 LAB — CREATININE, SERUM
Creatinine, Ser: 0.71 mg/dL (ref 0.44–1.00)
GFR calc Af Amer: >60 mL/min (ref >60)
GFR calc non Af Amer: >60 mL/min (ref >60)

## 2018-05-03 LAB — NOVEL CORONAVIRUS, NAA (HOSP ORDER, SEND-OUT TO REF LAB; TAT 18-24 HRS): SARS-CoV-2, NAA: NOT DETECTED

## 2018-05-03 MED ORDER — INSULIN DETEMIR 100 UNIT/ML ~~LOC~~ SOLN
10.0000 [IU] | Freq: Every day | SUBCUTANEOUS | Status: DC
  Administered 2018-05-03 – 2018-05-06 (×4): 10 [IU] via SUBCUTANEOUS
  Filled 2018-05-03 (×4): qty 0.1

## 2018-05-03 MED ORDER — BENZONATATE 100 MG PO CAPS: 200.0000 mg | ORAL_CAPSULE | Freq: Three times a day (TID) | ORAL | Status: DC | PRN

## 2018-05-03 NOTE — Progress Notes (Signed)
[facility] PROGRESS NOTE  Stacy Shelton ZWC:[id number] DOB: 08/11/1960 DOA: 04/24/2018  PCP: Robyne Peers, MD  Brief History/Interval Summary: 58 year old with past medical history relevant for metastatic breast cancer, type 2 diabetes, chronic diastolic heart failure, hypertension who was initially hospitalized from 04/07/2018 to 04/16/2018 for septic shock thought to be secondary to pneumonia who presented to the emergency department with recurrent fevers and cough and found to have retropharyngeal fluid collection with extension of inflammation around right carotid.  Her course has been complicated by acute hypoxic respiratory failure with left pleural effusion.   Consultants:   Oncology, Dr. Alvy Bimler  ENT  Interventional radiology  Procedures: None  Antibiotics: Vancomycin and Zosyn  Subjective/Interval History: Patient's vital signs are improving, she is covid  negative x2 Heart rate is improving, blood pressure is stable, she is down to 5 L of oxygen.        ROS: Denies any headaches  Objective:  Vital Signs  Vitals:   05/03/18 0639 05/03/18 0800 05/03/18 0908 05/03/18 1000  BP:  (!) 112/33  (!) 95/33  Pulse: (!) 110 (!) 102  84  Resp: (!) 25 (!) 42  (!) 28  Temp:   97.8 F (36.6 C)   TempSrc:   Oral   SpO2: 96% 96%  98%  Weight:      Height:        Intake/Output Summary (Last 24 hours) at 05/03/2018 1146 Last data filed at 05/03/2018 2353 Gross per 24 hour  Intake 550.05 ml  Output 700 ml  Net -149.95 ml   Filed Weights   04/29/18 0447 04/30/18 0643 05/02/18 0418  Weight: 72.2 kg 71.3 kg 69.3 kg    General appearance: alert, cooperative, appears stated age and no distress Head: Normocephalic, without obvious abnormality, atraumatic Resp: Noted to be tachypneic.  Coarse breath sounds bilaterally.  Crackles at the bases.  Few rhonchi on the left. Cardio: S1-S2 is tachycardic regular.  No S3-S4.  No rubs or bruit. GI: soft, non-tender; bowel  sounds normal; no masses,  no organomegaly Extremities: extremities normal, atraumatic, no cyanosis or edema Pulses: 2+ and symmetric Neurologic: No focal neurological deficits  Lab Results:  Data Reviewed: I have personally reviewed following labs and imaging studies  CBC: Recent Labs  Lab 04/28/18 0337 04/28/18 1600 04/29/18 0220 04/30/18 0308 05/01/18 0436 05/02/18 0100  WBC 3.3*  --  8.6 7.4 7.1 14.3*  NEUTROABS  --   --  6.0 4.9  --  9.6*  HGB 6.2* 9.2* 8.0* 8.1* 7.4* 8.5*  HCT 20.5* 29.4* 25.7* 27.3* 24.2* 27.1*  MCV 90.7  --  88.9 90.7 89.3 89.1  PLT 26*  --  30* 33* 21* 25*    Basic Metabolic Panel: Recent Labs  Lab 04/28/18 0337 04/29/18 0220 04/30/18 0308 05/01/18 0436 05/02/18 0100  NA 140 137 141 139 137  K 3.5 3.5 2.9* 3.0* 3.9  CL 106 103 102 98 96*  CO2 25 25 31 31  32  GLUCOSE 144* 219* 156* 185* 297*  BUN 17 18 16 19  24*  CREATININE 1.00 0.75 0.78 0.70 0.69  CALCIUM 7.7* 8.1* 8.1* 8.0* 8.1*  MG  --   --  1.8  --  1.8    GFR: Estimated Creatinine Clearance: 71.6 mL/min (by C-G formula based on SCr of 0.69 mg/dL).  Liver Function Tests: Recent Labs  Lab 04/28/18 0337 04/29/18 0220 05/02/18 0100  AST 27 24 25   ALT 24 22 13   ALKPHOS 162* 237* 196*  BILITOT 2.6* 3.6* 3.4*  PROT 5.5* 5.9* 5.9*  ALBUMIN 1.8* 1.8* 1.8*    Coagulation Profile: No results for input(s): INR, PROTIME in the last 168 hours.  CBG: Recent Labs  Lab 04/30/18 1812 05/01/18 0759 05/02/18 0832 05/02/18 1751 05/03/18 0855  GLUCAP 225* 166* 237* 200* 245*    Anemia Panel: No results for input(s): VITAMINB12, FOLATE, FERRITIN, TIBC, IRON, RETICCTPCT in the last 72 hours.  Recent Results (from the past 240 hour(s))  Blood Culture (routine x 2)     Status: None   Collection Time: 04/24/18  9:30 AM  Result Value Ref Range Status   Specimen Description   Final    BLOOD RIGHT CHEST Performed at [facility] [address] [place],  [place] [address]    Special Requests   Final    BOTTLES DRAWN AEROBIC AND ANAEROBIC Blood Culture adequate volume Performed at [facility] [address], [place], [place] [address]    Culture   Final    NO GROWTH 5 DAYS Performed at [facility], [place] [address] [place], [place] [address]    Report Status 04/29/2018 FINAL  Final  Blood Culture (routine x 2)     Status: None   Collection Time: 04/24/18  9:30 AM  Result Value Ref Range Status   Specimen Description   Final    BLOOD RIGHT ANTECUBITAL Performed at [facility] [address], [place], [place] [address]    Special Requests   Final    BOTTLES DRAWN AEROBIC AND ANAEROBIC Blood Culture adequate volume Performed at [facility] [address], [place], [place] [address]    Culture   Final    NO GROWTH 5 DAYS Performed at [facility], [place] [address], [place], [place] [address]    Report Status 04/29/2018 FINAL  Final  Respiratory Panel by PCR     Status: None   Collection Time: 04/25/18 11:52 AM  Result Value Ref Range Status   Adenovirus NOT DETECTED NOT DETECTED Final   Coronavirus 229E NOT DETECTED NOT DETECTED Final    Comment: (NOTE) The Coronavirus on the Respiratory Panel, DOES NOT test for the novel  Coronavirus (2019 nCoV)    Coronavirus HKU1 NOT DETECTED NOT DETECTED Final   Coronavirus NL63 NOT DETECTED NOT DETECTED Final   Coronavirus OC43 NOT DETECTED NOT DETECTED Final   Metapneumovirus NOT DETECTED NOT DETECTED Final   Rhinovirus / Enterovirus NOT DETECTED NOT DETECTED Final   Influenza A NOT DETECTED NOT DETECTED Final   Influenza B NOT DETECTED NOT DETECTED Final   Parainfluenza Virus 1 NOT DETECTED NOT DETECTED Final   Parainfluenza Virus 2 NOT DETECTED NOT DETECTED Final   Parainfluenza Virus 3 NOT DETECTED NOT DETECTED Final   Parainfluenza Virus 4 NOT DETECTED NOT DETECTED Final   Respiratory Syncytial Virus NOT DETECTED NOT  DETECTED Final   Bordetella pertussis NOT DETECTED NOT DETECTED Final   Chlamydophila pneumoniae NOT DETECTED NOT DETECTED Final   Mycoplasma pneumoniae NOT DETECTED NOT DETECTED Final    Comment: Performed at [facility], [place] [address] [place], [place] [address]  Culture, body fluid-bottle     Status: None   Collection Time: 04/27/18  9:50 AM  Result Value Ref Range Status   Specimen Description PLEURAL LEFT  Final   Special Requests NONE  Final   Culture   Final    NO GROWTH 5 DAYS Performed at [facility]  [facility], [place] [address], [place], [place] [address]    Report Status 05/02/2018 FINAL  Final  Gram stain     Status: None   Collection Time: 04/27/18  9:50 AM  Result Value Ref Range Status   Specimen Description PLEURAL LEFT  Final   Special Requests NONE  Final   Gram Stain   Final    MODERATE WBC PRESENT,BOTH PMN AND MONONUCLEAR NO ORGANISMS SEEN Performed at [facility], [place] [address] [place], [place] [address]    Report Status 04/27/2018 FINAL  Final  Novel Coronavirus, NAA (hospital order; send-out to ref lab)     Status: None   Collection Time: 04/28/18  9:26 AM  Result Value Ref Range Status   SARS-CoV-2, NAA NOT DETECTED NOT DETECTED Final    Comment: Negative (Not Detected) results do not exclude infection caused by SARS CoV 2 and should not be used as the sole basis for treatment or other patient management decisions. Optimum specimen types and timing for peak viral levels during infections caused  by SARS CoV 2 have not been determined. Collection of multiple specimens (types and time points) from the same patient may be necessary to detect the virus. Improper specimen collection and handling, sequence variability underlying assay primers and or probes, or the presence of organisms in  quantities less than the limit of detection of the assay may lead to false negative results. Positive and negative predictive values of testing are highly dependent on  prevalence. False negative results are more likely when prevalence of disease is high. (NOTE) The expected result is Negative (Not Detected). The SARS CoV 2 test is intended for the presumptive qualitative  detection of nucleic acid from SARS CoV 2 in upper and lower  respir atory specimens. Testing methodology is real time RT PCR. Test results must be correlated with clinical presentation and  evaluated in the context of other laboratory and epidemiologic data.  Test performance can be affected because the epidemiology and  clinical spectrum of infection caused by SARS CoV 2 is not fully  known. For example, the optimum types of specimens to collect and  when during the course of infection these specimens are most likely  to contain detectable viral RNA may not be known. This test has not been Food and Drug Administration (FDA) cleared or  approved and has been authorized by FDA under an Emergency Use  Authorization (EUA). The test is only authorized for the duration of  the declaration that circumstances exist justifying the authorization  of emergency use of in vitro diagnostic tests for detection and or  diagnosis of SARS CoV 2 under Section 564(b)(1) of the Act, 21 U.S.C.  section [phone] 3(b)(1), unless the authorization is terminated or   revoked sooner. [facility] is certified under the  Clinical Laboratory Improvement Amendments of 1988 (CLIA), 42 U.S.C.  section [phone], to perform high complexity tests. Performed at [facility] [id number] [address], [place], [place] [address] Laboratory Director: Loleta Books, MD Performed at [facility], [place] [address], [place], [place] [address]    Coronavirus Source NASOPHARYNGEAL  Final    Comment: Performed at [facility], [place] [address], [place], [place] [address]  Novel Coronavirus, NAA (hospital order; send-out to ref lab)     Status: None    Collection Time: 05/01/18 12:46 PM  Result Value Ref Range Status   SARS-CoV-2, NAA NOT  DETECTED NOT DETECTED Final    Comment: Negative (Not Detected) results do not exclude infection caused by SARS CoV 2 and should not be used as the sole basis for treatment or other patient management decisions. Optimum specimen types and timing for peak viral levels during infections caused  by SARS CoV 2 have not been determined. Collection of multiple specimens (types and time points) from the same patient may be necessary to detect the virus. Improper specimen collection and handling, sequence variability underlying assay primers and or probes, or the presence of organisms in  quantities less than the limit of detection of the assay may lead to false negative results. Positive and negative predictive values of testing are highly dependent on prevalence. False negative results are more likely when prevalence of disease is high. (NOTE) The expected result is Negative (Not Detected). The SARS CoV 2 test is intended for the presumptive qualitative  detection of nucleic acid from SARS CoV 2 in upper and lower  respir atory specimens. Testing methodology is real time RT PCR. Test results must be correlated with clinical presentation and  evaluated in the context of other laboratory and epidemiologic data.  Test performance can be affected because the epidemiology and  clinical spectrum of infection caused by SARS CoV 2 is not fully  known. For example, the optimum types of specimens to collect and  when during the course of infection these specimens are most likely  to contain detectable viral RNA may not be known. This test has not been Food and Drug Administration (FDA) cleared or  approved and has been authorized by FDA under an Emergency Use  Authorization (EUA). The test is only authorized for the duration of  the declaration that circumstances exist justifying the authorization  of emergency use of  in vitro diagnostic tests for detection and or  diagnosis of SARS CoV 2 under Section 564(b)(1) of the Act, 21 U.S.C.  section [phone] 3(b)(1), unless the authorization is terminated or   revoked sooner. [facility] is certified under the  Clinical Laboratory Improvement Amendments of 1988 (CLIA), 42 U.S.C.  section [phone], to perform high complexity tests. Performed at [facility] [id number] [address], [place], [place] [address] Laboratory Director: Loleta Books, MD Performed at [facility], [place] [address] [place], [place] [address]    Coronavirus Source NASOPHARYNGEAL  Final    Comment: Performed at [facility], [place] [address] [place], [place] [address]      Radiology Studies: No results found.   Medications:  Scheduled: . chlorhexidine  15 mL Mouth Rinse BID  . Chlorhexidine Gluconate Cloth  6 each Topical Daily  . chlorpheniramine-HYDROcodone  5 mL Oral Q12H  . dextromethorphan-guaiFENesin  1 tablet Oral BID  . feeding supplement (ENSURE ENLIVE)  237 mL Oral Q24H  . feeding supplement (PRO-STAT SUGAR FREE 64)  30 mL Oral Daily  . insulin aspart  0-15 Units Subcutaneous TID WC  . magnesium oxide  400 mg Oral Daily  . mouth rinse  15 mL Mouth Rinse BID  . mouth rinse  15 mL Mouth Rinse q12n4p  . metoprolol tartrate  50 mg Oral BID  . montelukast  10 mg Oral Daily  . multivitamin with minerals  1 tablet Oral Daily  . pantoprazole  40 mg Oral BID  . potassium chloride  40 mEq Oral Daily   Continuous:  QJJ:HERDEYCXKGYJE, alum & mag hydroxide-simeth, benzonatate, cyclobenzaprine, metoprolol  tartrate, ondansetron (ZOFRAN) IV, sodium chloride flush    Assessment/Plan:  Date: 05/03/2018  Patient Isolation: Airborne+Contact  HCP PPE: Wearing all recommended PPE N95 mask + eye protection+ Gown + Gloves+ SurgicalCap Patient PPE: None   Acute respiratory failure with hypoxia  Patient recently treated for pneumonia in the beginning of March.  Chest x-ray on admission actually showed improvement .  On 3/28 patient became acutely hypoxic and developed a fever.  Chest x-ray showed layering pleural effusion.  120 cc of fluid removed with the thoracentesis.  Cytology has been negative.  Cultures have been negative so far.  Fluid was thought to be exudative in nature.  Patient was continued on IV antibiotics.  She is currently on vancomycin and Zosyn.  Discussed with infectious disease. COVID test negative, x2 discontinued airborne plus contact precautions.  Influenza and respiratory viral pathogen panel was negative.  Patient remains on HFNC.10 L> 5 L, will remain in stepdown because of increasing oxygen requirements, tachycardia, she is also currently a full code.   wean oxygen as needed able to keep oxygen saturation greater than 94%, up in the chair, ambulate with assistance as tolerated  Supraventricular tachycardia EKG shows SVT.  Appears to be regular rhythm.  No previous history of arrhythmias.  EKG in ICU showed  SVT.  Patient given a dose of metoprolol with improvement in the heart rate.  She will be given fluid boluses as her blood pressure did drop some.   Echocardiogram from February showed normal systolic function.  TSH was normal when checked in March.patient became tachycardic last night with heart rate in the 160s to 170s, she received 1 dose of digoxin 0.5 mg IV, Lopressor 5 mg IV as well as adenosine 6 mg IV.  Tachycardia resolved with IV adenosine.  Heart rate in the 90s this morning at rest.continue to replete electrolytes   Retropharyngeal fluid collection/tender cervical lymphadenopathy Patient was seen by ENT.  The fluid collection apparently has clinically improved.  Neck pain and swelling has also significantly improved.  No ENT intervention is recommended.  ENT has signed off  History of COPD/asthma Stable.  See above.  Diabetes mellitus type 2 Patient  has had a few episodes of hypoglycemia likely due to infection and poor oral intake.  Continue to monitor closely.  Resumed  Levemir at a lower dose.  History of chronic diastolic CHF Patient appears to be euvolemic to hypovolemic.  Continue to monitor for now.  Essential hypertension Patient with borderline blood pressures.  She was noted to be hypotensive and so her metoprolol was discontinued recently.  This could be the reason for her SVT this morning.  Metastatic breast cancer Oncology was notified.  Pancytopenia Patient has chronic thrombocytopenia.  Also has chemotherapy-induced anemia.  Hemoglobin is stable.  Hypokalemia Continue to maintain potassium greater than 4 and magnesium greater than 2  DVT Prophylaxis: SCDs    Code Status:   full code. Family Communication: Discussed with the patient, patient's mother and sister Disposition Plan  patient transferred to telemetry, continue to wean oxygen     LOS: 9 days   Shardee Dieu  [facility] operational officer on [email or web address]  05/03/2018, 11:46 AM

## 2018-05-03 NOTE — Progress Notes (Signed)
Patient having coughing episodes, sounds like loose congestion this morning. Notified MD on call via text/page for something for cough per pt request. Awaiting call back.

## 2018-05-03 NOTE — Progress Notes (Signed)
PT Cancellation Note  Patient Details Name: Stacy Shelton MRN: [id number] DOB: [birth year]   Cancelled Treatment:    Reason Eval/Treat Not Completed: Fatigue/lethargy limiting ability to participate   [facility] 05/03/2018, 4:49 PM

## 2018-05-03 NOTE — Progress Notes (Signed)
I have spoken with pt's PCP, nursing staff, reviewed patient's chart. They have a clear alternative diagnosis and negative test x 2, COVID precautions are being d/c.  Thayer Headings, MD

## 2018-05-03 NOTE — Progress Notes (Signed)
[facility] for Infectious Disease   Reason for visit: Follow up on pharyngeal swelling, pleural effusion  Interval History: now COVID negative x 2; improved oxygenation. Feels better, much less pharyngeal swelling;  Tmax just 10.2 Vancomycin day 10 piptazo day 9  Physical Exam: Constitutional:  Vitals:   05/03/18 0639 05/03/18 0908  BP:    Pulse: (!) 110   Resp: (!) 25   Temp:  97.8 F (36.6 C)  SpO2: 96%    patient appears in NAD Eyes: anicteric HENT: no pharyngeal edema noted Respiratory: Normal respiratory effort; CTA B Cardiovascular: RRR GI: soft, nt, nd  Review of Systems: Constitutional: negative for fevers and chills Gastrointestinal: negative for nausea and diarrhea Integument/breast: negative for rash  Lab Results  Component Value Date   WBC 14.3 (H) 05/02/2018   HGB 8.5 (L) 05/02/2018   HCT 27.1 (L) 05/02/2018   MCV 89.1 05/02/2018   PLT 25 (LL) 05/02/2018    Lab Results  Component Value Date   CREATININE 0.69 05/02/2018   BUN 24 (H) 05/02/2018   NA 137 05/02/2018   K 3.9 05/02/2018   CL 96 (L) 05/02/2018   CO2 32 05/02/2018    Lab Results  Component Value Date   ALT 13 05/02/2018   AST 25 05/02/2018   ALKPHOS 196 (H) 05/02/2018     Microbiology: Recent Results (from the past 240 hour(s))  Blood Culture (routine x 2)     Status: None   Collection Time: 04/24/18  9:30 AM  Result Value Ref Range Status   Specimen Description   Final    BLOOD RIGHT CHEST Performed at [facility], [place] [address], [place], [place] [address]    Special Requests   Final    BOTTLES DRAWN AEROBIC AND ANAEROBIC Blood Culture adequate volume Performed at [facility] [address], [place], [place] [address]    Culture   Final    NO GROWTH 5 DAYS Performed at [facility], [place] [address] [place], [place] [address]    Report Status 04/29/2018 FINAL  Final  Blood Culture (routine x 2)     Status: None   Collection Time: 04/24/18  9:30 AM  Result Value Ref Range Status   Specimen Description   Final    BLOOD RIGHT ANTECUBITAL Performed at [facility] [address], [place], [place] [address]    Special Requests   Final    BOTTLES DRAWN AEROBIC AND ANAEROBIC Blood Culture adequate volume Performed at [facility] [address], [place], [place] [address]    Culture   Final    NO GROWTH 5 DAYS Performed at [facility], [place] [address] [place], [place] [address]    Report Status 04/29/2018 FINAL  Final  Respiratory Panel by PCR     Status: None   Collection Time: 04/25/18 11:52 AM  Result Value Ref Range Status   Adenovirus NOT DETECTED NOT DETECTED Final   Coronavirus 229E NOT DETECTED NOT DETECTED Final    Comment: (NOTE) The Coronavirus on the Respiratory Panel, DOES NOT test for the novel  Coronavirus (2019 nCoV)    Coronavirus HKU1 NOT DETECTED NOT DETECTED Final   Coronavirus NL63 NOT DETECTED NOT DETECTED Final   Coronavirus OC43 NOT DETECTED NOT DETECTED Final   Metapneumovirus NOT DETECTED NOT DETECTED Final   Rhinovirus / Enterovirus NOT DETECTED NOT DETECTED Final   Influenza A NOT DETECTED NOT DETECTED Final  Influenza B NOT DETECTED NOT DETECTED Final   Parainfluenza Virus 1 NOT DETECTED NOT DETECTED Final   Parainfluenza Virus 2 NOT DETECTED NOT DETECTED Final   Parainfluenza Virus 3 NOT DETECTED NOT DETECTED Final   Parainfluenza Virus 4 NOT DETECTED NOT DETECTED Final   Respiratory Syncytial Virus NOT DETECTED NOT DETECTED Final   Bordetella pertussis NOT DETECTED NOT DETECTED Final   Chlamydophila pneumoniae NOT DETECTED NOT DETECTED Final   Mycoplasma pneumoniae NOT DETECTED NOT DETECTED Final    Comment: Performed at [facility], [place] [address] [place], [place] [address]  Culture, body fluid-bottle     Status: None   Collection Time: 04/27/18  9:50 AM  Result Value Ref Range Status   Specimen  Description PLEURAL LEFT  Final   Special Requests NONE  Final   Culture   Final    NO GROWTH 5 DAYS Performed at [facility] [address], [place], [place] [address]    Report Status 05/02/2018 FINAL  Final  Gram stain     Status: None   Collection Time: 04/27/18  9:50 AM  Result Value Ref Range Status   Specimen Description PLEURAL LEFT  Final   Special Requests NONE  Final   Gram Stain   Final    MODERATE WBC PRESENT,BOTH PMN AND MONONUCLEAR NO ORGANISMS SEEN Performed at [facility], [place] [address], [place], [place] [address]    Report Status 04/27/2018 FINAL  Final  Novel Coronavirus, NAA (hospital order; send-out to ref lab)     Status: None   Collection Time: 04/28/18  9:26 AM  Result Value Ref Range Status   SARS-CoV-2, NAA NOT DETECTED NOT DETECTED Final    Comment: Negative (Not Detected) results do not exclude infection caused by SARS CoV 2 and should not be used as the sole basis for treatment or other patient management decisions. Optimum specimen types and timing for peak viral levels during infections caused  by SARS CoV 2 have not been determined. Collection of multiple specimens (types and time points) from the same patient may be necessary to detect the virus. Improper specimen collection and handling, sequence variability underlying assay primers and or probes, or the presence of organisms in  quantities less than the limit of detection of the assay may lead to false negative results. Positive and negative predictive values of testing are highly dependent on prevalence. False negative results are more likely when prevalence of disease is high. (NOTE) The expected result is Negative (Not Detected). The SARS CoV 2 test is intended for the presumptive qualitative  detection of nucleic acid from SARS CoV 2 in upper and lower  respir atory specimens. Testing methodology is real time RT PCR. Test results must be correlated with clinical presentation and   evaluated in the context of other laboratory and epidemiologic data.  Test performance can be affected because the epidemiology and  clinical spectrum of infection caused by SARS CoV 2 is not fully  known. For example, the optimum types of specimens to collect and  when during the course of infection these specimens are most likely  to contain detectable viral RNA may not be known. This test has not been Food and Drug Administration (FDA) cleared or  approved and has been authorized by FDA under an Emergency Use  Authorization (EUA). The test is only authorized for the duration of  the declaration that circumstances exist justifying the authorization  of emergency use of in vitro  diagnostic tests for detection and or  diagnosis of SARS CoV 2 under Section 564(b)(1) of the Act, 21 U.S.C.  section [phone] 3(b)(1), unless the authorization is terminated or   revoked sooner. [facility] is certified under the  Clinical Laboratory Improvement Amendments of 1988 (CLIA), 42 U.S.C.  section [phone], to perform high complexity tests. Performed at [facility] [id number] [address], [place], [place] [address] Laboratory Director: Loleta Books, MD Performed at [facility], [place] [address], [place], [place] [address]    Coronavirus Source NASOPHARYNGEAL  Final    Comment: Performed at [facility], [place] [address], [place], [place] [address]  Novel Coronavirus, NAA (hospital order; send-out to ref lab)     Status: None   Collection Time: 05/01/18 12:46 PM  Result Value Ref Range Status   SARS-CoV-2, NAA NOT DETECTED NOT DETECTED Final    Comment: Negative (Not Detected) results do not exclude infection caused by SARS CoV 2 and should not be used as the sole basis for treatment or other patient management decisions. Optimum specimen types and timing for peak viral levels during infections caused  by SARS CoV 2  have not been determined. Collection of multiple specimens (types and time points) from the same patient may be necessary to detect the virus. Improper specimen collection and handling, sequence variability underlying assay primers and or probes, or the presence of organisms in  quantities less than the limit of detection of the assay may lead to false negative results. Positive and negative predictive values of testing are highly dependent on prevalence. False negative results are more likely when prevalence of disease is high. (NOTE) The expected result is Negative (Not Detected). The SARS CoV 2 test is intended for the presumptive qualitative  detection of nucleic acid from SARS CoV 2 in upper and lower  respir atory specimens. Testing methodology is real time RT PCR. Test results must be correlated with clinical presentation and  evaluated in the context of other laboratory and epidemiologic data.  Test performance can be affected because the epidemiology and  clinical spectrum of infection caused by SARS CoV 2 is not fully  known. For example, the optimum types of specimens to collect and  when during the course of infection these specimens are most likely  to contain detectable viral RNA may not be known. This test has not been Food and Drug Administration (FDA) cleared or  approved and has been authorized by FDA under an Emergency Use  Authorization (EUA). The test is only authorized for the duration of  the declaration that circumstances exist justifying the authorization  of emergency use of in vitro diagnostic tests for detection and or  diagnosis of SARS CoV 2 under Section 564(b)(1) of the Act, 21 U.S.C.  section [phone] 3(b)(1), unless the authorization is terminated or   revoked sooner. [facility] is certified under the  Clinical Laboratory Improvement Amendments of 1988 (CLIA), 42 U.S.C.  section [phone], to perform high complexity tests. Performed at [facility] [id number] [address], [place], [place] [address] Laboratory Director: Loleta Books, MD Performed at [facility], [place] [address] [place], [place] [address]    Coronavirus Source NASOPHARYNGEAL  Final    Comment: Performed at [facility], [place] [address], [place],  [address]    Impression/Plan:  1. Pneumonia - has been on  prolonged antibiotics, improved.  Treated.  2.  Pharyngeal swelling - much improved.  No current signs of infection.  Will stop antibiotics  3.  Respiratory failure - weaning O2 to 88-92%.

## 2018-05-04 DIAGNOSIS — D649 Anemia, unspecified: Secondary | ICD-10-CM

## 2018-05-04 DIAGNOSIS — Z9889 Other specified postprocedural states: Secondary | ICD-10-CM

## 2018-05-04 LAB — CBC
HCT: 22 % — ABNORMAL LOW (ref 36.0–46.0)
Hemoglobin: 6.9 g/dL — CL (ref 12.0–15.0)
MCH: 28.1 pg (ref 26.0–34.0)
MCHC: 30.9 g/dL (ref 30.0–36.0)
MCV: 90.9 fL (ref 80.0–100.0)
Platelets: 14 10*3/uL — CL (ref 150–400)
RBC: 2.42 MIL/uL — ABNORMAL LOW (ref 3.87–5.11)
RDW: 19.3 % — ABNORMAL HIGH (ref 11.5–15.5)
WBC: 12.9 10*3/uL — ABNORMAL HIGH (ref 4.0–10.5)
nRBC: 0 % (ref 0.0–0.2)

## 2018-05-04 LAB — GLUCOSE, CAPILLARY
Glucose-Capillary: 145 mg/dL — ABNORMAL HIGH (ref 70–99)
Glucose-Capillary: 163 mg/dL — ABNORMAL HIGH (ref 70–99)
Glucose-Capillary: 152 mg/dL — ABNORMAL HIGH (ref 70–99)

## 2018-05-04 LAB — CREATININE, SERUM
Creatinine, Ser: 0.54 mg/dL (ref 0.44–1.00)
GFR calc Af Amer: >60 mL/min (ref >60)
GFR calc non Af Amer: >60 mL/min (ref >60)

## 2018-05-04 LAB — HEMOGLOBIN AND HEMATOCRIT, BLOOD
HCT: 26.1 % — ABNORMAL LOW (ref 36.0–46.0)
Hemoglobin: 8.3 g/dL — ABNORMAL LOW (ref 12.0–15.0)

## 2018-05-04 LAB — PREPARE RBC (CROSSMATCH)

## 2018-05-04 MED ORDER — BENZONATATE 100 MG PO CAPS
200.0000 mg | ORAL_CAPSULE | Freq: Three times a day (TID) | ORAL | Status: DC
  Administered 2018-05-04 – 2018-05-06 (×7): 200 mg via ORAL
  Filled 2018-05-04 (×7): qty 2

## 2018-05-04 MED ORDER — SODIUM CHLORIDE 0.9% IV SOLUTION: Freq: Once | INTRAVENOUS | Status: DC

## 2018-05-04 NOTE — Progress Notes (Addendum)
[facility] for Infectious Disease   Reason for visit: Follow up on pharyngeal swelling, pleural effusion  Interval History: continued improved oxygenation. Feels better, no pharyngeal swelling;  Tmax 101.3.  Gets fatigued and DOE with moving to [place].    Completed 10 days antibiotics  Physical Exam: Constitutional:  Vitals:   05/04/18 1042 05/04/18 1123  BP: (!) 130/52 (!) 126/49  Pulse: 96 94  Resp: (!) 42 (!) 39  Temp: 99.4 F (37.4 C) 100.1 F (37.8 C)  SpO2: 93% 95%   patient appears in NAD Eyes: anicteric HENT: no pharyngeal edema noted Respiratory: Normal respiratory effort; diffuse fine crackles Cardiovascular: RRR GI: soft, nt, nd  Review of Systems: Constitutional: negative for chills Gastrointestinal: negative for nausea and diarrhea Integument/breast: negative for rash  Lab Results  Component Value Date   WBC 12.9 (H) 05/04/2018   HGB 6.9 (LL) 05/04/2018   HCT 22.0 (L) 05/04/2018   MCV 90.9 05/04/2018   PLT 14 (LL) 05/04/2018    Lab Results  Component Value Date   CREATININE 0.54 05/04/2018   BUN 24 (H) 05/02/2018   NA 137 05/02/2018   K 3.9 05/02/2018   CL 96 (L) 05/02/2018   CO2 32 05/02/2018    Lab Results  Component Value Date   ALT 13 05/02/2018   AST 25 05/02/2018   ALKPHOS 196 (H) 05/02/2018     Microbiology: Recent Results (from the past 240 hour(s))  Respiratory Panel by PCR     Status: None   Collection Time: 04/25/18 11:52 AM  Result Value Ref Range Status   Adenovirus NOT DETECTED NOT DETECTED Final   Coronavirus 229E NOT DETECTED NOT DETECTED Final    Comment: (NOTE) The Coronavirus on the Respiratory Panel, DOES NOT test for the novel  Coronavirus (2019 nCoV)    Coronavirus HKU1 NOT DETECTED NOT DETECTED Final   Coronavirus NL63 NOT DETECTED NOT DETECTED Final   Coronavirus OC43 NOT DETECTED NOT DETECTED Final   Metapneumovirus NOT DETECTED NOT DETECTED Final   Rhinovirus / Enterovirus NOT DETECTED NOT DETECTED Final    Influenza A NOT DETECTED NOT DETECTED Final   Influenza B NOT DETECTED NOT DETECTED Final   Parainfluenza Virus 1 NOT DETECTED NOT DETECTED Final   Parainfluenza Virus 2 NOT DETECTED NOT DETECTED Final   Parainfluenza Virus 3 NOT DETECTED NOT DETECTED Final   Parainfluenza Virus 4 NOT DETECTED NOT DETECTED Final   Respiratory Syncytial Virus NOT DETECTED NOT DETECTED Final   Bordetella pertussis NOT DETECTED NOT DETECTED Final   Chlamydophila pneumoniae NOT DETECTED NOT DETECTED Final   Mycoplasma pneumoniae NOT DETECTED NOT DETECTED Final    Comment: Performed at [facility], [address], [place], [place] [address]  Culture, body fluid-bottle     Status: None   Collection Time: 04/27/18  9:50 AM  Result Value Ref Range Status   Specimen Description PLEURAL LEFT  Final   Special Requests NONE  Final   Culture   Final    NO GROWTH 5 DAYS Performed at [facility] [address], [place], [place] [address]    Report Status 05/02/2018 FINAL  Final  Gram stain     Status: None   Collection Time: 04/27/18  9:50 AM  Result Value Ref Range Status   Specimen Description PLEURAL LEFT  Final   Special Requests NONE  Final   Gram Stain   Final    MODERATE WBC PRESENT,BOTH PMN AND MONONUCLEAR NO ORGANISMS  SEEN Performed at [facility], [place] [address], [place], [place] [address]    Report Status 04/27/2018 FINAL  Final  Novel Coronavirus, NAA (hospital order; send-out to ref lab)     Status: None   Collection Time: 04/28/18  9:26 AM  Result Value Ref Range Status   SARS-CoV-2, NAA NOT DETECTED NOT DETECTED Final    Comment: Negative (Not Detected) results do not exclude infection caused by SARS CoV 2 and should not be used as the sole basis for treatment or other patient management decisions. Optimum specimen types and timing for peak viral levels during infections caused  by SARS CoV 2 have not been determined. Collection of multiple specimens (types and time  points) from the same patient may be necessary to detect the virus. Improper specimen collection and handling, sequence variability underlying assay primers and or probes, or the presence of organisms in  quantities less than the limit of detection of the assay may lead to false negative results. Positive and negative predictive values of testing are highly dependent on prevalence. False negative results are more likely when prevalence of disease is high. (NOTE) The expected result is Negative (Not Detected). The SARS CoV 2 test is intended for the presumptive qualitative  detection of nucleic acid from SARS CoV 2 in upper and lower  respir atory specimens. Testing methodology is real time RT PCR. Test results must be correlated with clinical presentation and  evaluated in the context of other laboratory and epidemiologic data.  Test performance can be affected because the epidemiology and  clinical spectrum of infection caused by SARS CoV 2 is not fully  known. For example, the optimum types of specimens to collect and  when during the course of infection these specimens are most likely  to contain detectable viral RNA may not be known. This test has not been Food and Drug Administration (FDA) cleared or  approved and has been authorized by FDA under an Emergency Use  Authorization (EUA). The test is only authorized for the duration of  the declaration that circumstances exist justifying the authorization  of emergency use of in vitro diagnostic tests for detection and or  diagnosis of SARS CoV 2 under Section 564(b)(1) of the Act, 21 U.S.C.  section [phone] 3(b)(1), unless the authorization is terminated or   revoked sooner. [facility] is certified under the  Clinical Laboratory Improvement Amendments of 1988 (CLIA), 42 U.S.C.  section [phone], to perform high complexity tests. Performed at [facility] [id number] [address],  [place], [place] [address] Laboratory Director: Loleta Books, MD Performed at [facility], [place] [address] [place], [place] [address]    Coronavirus Source NASOPHARYNGEAL  Final    Comment: Performed at [facility], [place] [address] [place],  [address]  Novel Coronavirus, NAA (hospital order; send-out to ref lab)     Status: None   Collection Time: 05/01/18 12:46 PM  Result Value Ref Range Status   SARS-CoV-2, NAA NOT DETECTED NOT DETECTED Final    Comment: Negative (Not Detected) results do not exclude infection caused by SARS CoV 2 and should not be used as the sole basis for treatment or other patient management decisions. Optimum specimen types and timing for peak viral levels during infections caused  by SARS CoV 2 have not been determined. Collection of multiple specimens (types and time points) from the same patient may be necessary to detect  the virus. Improper specimen collection and handling, sequence variability underlying assay primers and or probes, or the presence of organisms in  quantities less than the limit of detection of the assay may lead to false negative results. Positive and negative predictive values of testing are highly dependent on prevalence. False negative results are more likely when prevalence of disease is high. (NOTE) The expected result is Negative (Not Detected). The SARS CoV 2 test is intended for the presumptive qualitative  detection of nucleic acid from SARS CoV 2 in upper and lower  respir atory specimens. Testing methodology is real time RT PCR. Test results must be correlated with clinical presentation and  evaluated in the context of other laboratory and epidemiologic data.  Test performance can be affected because the epidemiology and  clinical spectrum of infection caused by SARS CoV 2 is not fully  known. For example, the optimum types of specimens to collect and  when during the course of infection these  specimens are most likely  to contain detectable viral RNA may not be known. This test has not been Food and Drug Administration (FDA) cleared or  approved and has been authorized by FDA under an Emergency Use  Authorization (EUA). The test is only authorized for the duration of  the declaration that circumstances exist justifying the authorization  of emergency use of in vitro diagnostic tests for detection and or  diagnosis of SARS CoV 2 under Section 564(b)(1) of the Act, 21 U.S.C.  section [phone] 3(b)(1), unless the authorization is terminated or   revoked sooner. [facility] is certified under the  Clinical Laboratory Improvement Amendments of 1988 (CLIA), 42 U.S.C.  section [phone], to perform high complexity tests. Performed at [facility] [id number] [address], [place], [place] [address] Laboratory Director: Loleta Books, MD Performed at [facility], [place] [address] [place], [place] [address]    Coronavirus Source NASOPHARYNGEAL  Final    Comment: Performed at [facility], [place] [address], [place], [place] [address]    Impression/Plan:  1. Pneumonia - has completed treatment.   2.  Pharyngeal swelling - resolved.  Continue to watch off of antibiotics.   3.  Respiratory failure - weaning O2 and improved.  ? More atelectasis.  I will repeat the CXR for tomorrow.  ? Effusion.   Required thoracentesis, done 3/29.    4.  Anemia - getting  Transfused.    Dr. Catalina Antigua on service tomorrow

## 2018-05-04 NOTE — Progress Notes (Signed)
OT Cancellation Note  Patient Details Name: Stacy Shelton MRN: [id number] DOB: 1960/04/17   Cancelled Treatment:    Reason Eval/Treat Not Completed: Medical issues which prohibited therapy  Noted pt to get blood- will check on pt later this day or next day  Kari Baars, [place] [phone] Office- Tyler D 05/04/2018, 1:33 PM

## 2018-05-04 NOTE — Progress Notes (Signed)
[facility] PROGRESS NOTE  Stacy Shelton WIO:[id number] DOB: 02/11/1960 DOA: 04/24/2018  PCP: Robyne Peers, MD  Brief History/Interval Summary: 59 year old with past medical history relevant for metastatic breast cancer, type 2 diabetes, chronic diastolic heart failure, hypertension who was initially hospitalized from 04/07/2018 to 04/16/2018 for septic shock thought to be secondary to pneumonia who presented to the emergency department with recurrent fevers and cough and found to have retropharyngeal fluid collection with extension of inflammation around right carotid.  Her course has been complicated by acute hypoxic respiratory failure with left pleural effusion.   Consultants:   Oncology, Dr. Alvy Bimler  ENT  Interventional radiology  Procedures: None  Antibiotics: Anti-infectives (From admission, onward)   Start     Dose/Rate Route Frequency Ordered Stop   04/30/18 1000  vancomycin (VANCOCIN) IVPB 750 mg/150 ml premix  Status:  Discontinued     750 mg 150 mL/hr over 60 Minutes Intravenous Every 12 hours 04/30/18 0849 05/03/18 1034   04/28/18 1800  vancomycin (VANCOCIN) IVPB 1000 mg/200 mL premix  Status:  Discontinued     1,000 mg 200 mL/hr over 60 Minutes Intravenous Every 24 hours 04/28/18 1748 04/30/18 0849   04/28/18 1354  vancomycin variable dose per unstable renal function (pharmacist dosing)  Status:  Discontinued      Does not apply See admin instructions 04/28/18 1354 04/28/18 1748   04/26/18 2200  piperacillin-tazobactam (ZOSYN) IVPB 3.375 g  Status:  Discontinued     3.375 g 12.5 mL/hr over 240 Minutes Intravenous Every 8 hours 04/26/18 1602 05/03/18 1034   04/26/18 2200  vancomycin (VANCOCIN) IVPB 750 mg/150 ml premix  Status:  Discontinued     750 mg 150 mL/hr over 60 Minutes Intravenous 2 times daily 04/26/18 1602 04/28/18 0914   04/25/18 2000  piperacillin-tazobactam (ZOSYN) IVPB 3.375 g  Status:  Discontinued     3.375 g 12.5 mL/hr over 240 Minutes  Intravenous Every 8 hours 04/25/18 1239 04/26/18 1602   04/25/18 1245  piperacillin-tazobactam (ZOSYN) IVPB 3.375 g  Status:  Discontinued     3.375 g 100 mL/hr over 30 Minutes Intravenous Every 6 hours 04/25/18 1236 04/25/18 1237   04/24/18 1800  ceFEPIme (MAXIPIME) 1 g in sodium chloride 0.9 % 100 mL IVPB  Status:  Discontinued     1 g 200 mL/hr over 30 Minutes Intravenous Every 8 hours 04/24/18 1504 04/25/18 1236   04/24/18 1800  vancomycin (VANCOCIN) IVPB 750 mg/150 ml premix  Status:  Discontinued     750 mg 150 mL/hr over 60 Minutes Intravenous Every 12 hours 04/24/18 1525 04/26/18 1602   04/24/18 0915  vancomycin (VANCOCIN) IVPB 1000 mg/200 mL premix     1,000 mg 200 mL/hr over 60 Minutes Intravenous  Once 04/24/18 0904 04/24/18 1129   04/24/18 0915  ceFEPIme (MAXIPIME) 2 g in sodium chloride 0.9 % 100 mL IVPB     2 g 200 mL/hr over 30 Minutes Intravenous  Once 04/24/18 0904 04/24/18 1024      Subjective/Interval History: Patient continues to have a cough which is mostly dry.  Denies any bleeding.  Denies any neck pain.     ROS: Denies any headaches  Objective:  Vital Signs  Vitals:   05/04/18 0531 05/04/18 0828 05/04/18 1042 05/04/18 1123  BP: (!) 105/50 116/76 (!) 130/52 (!) 126/49  Pulse: 76 95 96 94  Resp: (!) 24  (!) 42 (!) 39  Temp: 97.8 F (36.6 C)  99.4 F (37.4 C) 100.1 F (37.8  C)  TempSrc: Oral  Oral Oral  SpO2: 96% 94% 93% 95%  Weight:      Height:        Intake/Output Summary (Last 24 hours) at 05/04/2018 1127 Last data filed at 05/03/2018 2017 Gross per 24 hour  Intake 120 ml  Output 75 ml  Net 45 ml   Filed Weights   04/30/18 0643 05/02/18 0418 05/03/18 1428  Weight: 71.3 kg 69.3 kg 71.4 kg   General appearance: Awake alert.  In no distress Resp: Mildly tachypneic at rest.  Coarse breath sounds bilaterally.  No wheezing or rhonchi. Cardio: S1-S2 is normal regular.  No S3-S4.  No rubs murmurs or bruit.  Telemetry does not show any new episodes  of SVT. GI: Abdomen is soft.  Nontender nondistended.  Bowel sounds are present normal.  No masses organomegaly Extremities: No edema.  Full range of motion of lower extremities. Neurologic: Alert and oriented x3.  No focal neurological deficits.    Lab Results:  Data Reviewed: I have personally reviewed following labs and imaging studies  CBC: Recent Labs  Lab 04/29/18 0220 04/30/18 0308 05/01/18 0436 05/02/18 0100 05/04/18 0344  WBC 8.6 7.4 7.1 14.3* 12.9*  NEUTROABS 6.0 4.9  --  9.6*  --   HGB 8.0* 8.1* 7.4* 8.5* 6.9*  HCT 25.7* 27.3* 24.2* 27.1* 22.0*  MCV 88.9 90.7 89.3 89.1 90.9  PLT 30* 33* 21* 25* 14*    Basic Metabolic Panel: Recent Labs  Lab 04/28/18 0337 04/29/18 0220 04/30/18 0308 05/01/18 0436 05/02/18 0100 05/03/18 0853 05/04/18 0344  NA 140 137 141 139 137  --   --   K 3.5 3.5 2.9* 3.0* 3.9  --   --   CL 106 103 102 98 96*  --   --   CO2 25 25 31 31  32  --   --   GLUCOSE 144* 219* 156* 185* 297*  --   --   BUN 17 18 16 19  24*  --   --   CREATININE 1.00 0.75 0.78 0.70 0.69 0.71 0.54  CALCIUM 7.7* 8.1* 8.1* 8.0* 8.1*  --   --   MG  --   --  1.8  --  1.8  --   --     GFR: Estimated Creatinine Clearance: 70.9 mL/min (by C-G formula based on SCr of 0.54 mg/dL).  Liver Function Tests: Recent Labs  Lab 04/28/18 8466 04/29/18 0220 05/02/18 0100  AST 27 24 25   ALT 24 22 13   ALKPHOS 162* 237* 196*  BILITOT 2.6* 3.6* 3.4*  PROT 5.5* 5.9* 5.9*  ALBUMIN 1.8* 1.8* 1.8*    CBG: Recent Labs  Lab 05/02/18 1751 05/03/18 0855 05/03/18 1152 05/03/18 1644 05/04/18 0753  GLUCAP 200* 245* 243* 153* 145*    Recent Results (from the past 240 hour(s))  Respiratory Panel by PCR     Status: None   Collection Time: 04/25/18 11:52 AM  Result Value Ref Range Status   Adenovirus NOT DETECTED NOT DETECTED Final   Coronavirus 229E NOT DETECTED NOT DETECTED Final    Comment: (NOTE) The Coronavirus on the Respiratory Panel, DOES NOT test for the novel    Coronavirus (2019 nCoV)    Coronavirus HKU1 NOT DETECTED NOT DETECTED Final   Coronavirus NL63 NOT DETECTED NOT DETECTED Final   Coronavirus OC43 NOT DETECTED NOT DETECTED Final   Metapneumovirus NOT DETECTED NOT DETECTED Final   Rhinovirus / Enterovirus NOT DETECTED NOT DETECTED Final   Influenza A NOT  DETECTED NOT DETECTED Final   Influenza B NOT DETECTED NOT DETECTED Final   Parainfluenza Virus 1 NOT DETECTED NOT DETECTED Final   Parainfluenza Virus 2 NOT DETECTED NOT DETECTED Final   Parainfluenza Virus 3 NOT DETECTED NOT DETECTED Final   Parainfluenza Virus 4 NOT DETECTED NOT DETECTED Final   Respiratory Syncytial Virus NOT DETECTED NOT DETECTED Final   Bordetella pertussis NOT DETECTED NOT DETECTED Final   Chlamydophila pneumoniae NOT DETECTED NOT DETECTED Final   Mycoplasma pneumoniae NOT DETECTED NOT DETECTED Final    Comment: Performed at [facility], [place] [address] [place], [place] [address]  Culture, body fluid-bottle     Status: None   Collection Time: 04/27/18  9:50 AM  Result Value Ref Range Status   Specimen Description PLEURAL LEFT  Final   Special Requests NONE  Final   Culture   Final    NO GROWTH 5 DAYS Performed at [facility] [address] [place], [place] [address]    Report Status 05/02/2018 FINAL  Final  Gram stain     Status: None   Collection Time: 04/27/18  9:50 AM  Result Value Ref Range Status   Specimen Description PLEURAL LEFT  Final   Special Requests NONE  Final   Gram Stain   Final    MODERATE WBC PRESENT,BOTH PMN AND MONONUCLEAR NO ORGANISMS SEEN Performed at [facility], [place] [address] [place], [place] [address]    Report Status 04/27/2018 FINAL  Final  Novel Coronavirus, NAA (hospital order; send-out to ref lab)     Status: None   Collection Time: 04/28/18  9:26 AM  Result Value Ref Range Status   SARS-CoV-2, NAA NOT DETECTED NOT DETECTED Final    Comment: Negative (Not Detected) results do not exclude  infection caused by SARS CoV 2 and should not be used as the sole basis for treatment or other patient management decisions. Optimum specimen types and timing for peak viral levels during infections caused  by SARS CoV 2 have not been determined. Collection of multiple specimens (types and time points) from the same patient may be necessary to detect the virus. Improper specimen collection and handling, sequence variability underlying assay primers and or probes, or the presence of organisms in  quantities less than the limit of detection of the assay may lead to false negative results. Positive and negative predictive values of testing are highly dependent on prevalence. False negative results are more likely when prevalence of disease is high. (NOTE) The expected result is Negative (Not Detected). The SARS CoV 2 test is intended for the presumptive qualitative  detection of nucleic acid from SARS CoV 2 in upper and lower  respir atory specimens. Testing methodology is real time RT PCR. Test results must be correlated with clinical presentation and  evaluated in the context of other laboratory and epidemiologic data.  Test performance can be affected because the epidemiology and  clinical spectrum of infection caused by SARS CoV 2 is not fully  known. For example, the optimum types of specimens to collect and  when during the course of infection these specimens are most likely  to contain detectable viral RNA may not be known. This test has not been Food and Drug Administration (FDA) cleared or  approved and has been authorized by FDA under an Emergency Use  Authorization (EUA). The test is only authorized for the duration of  the declaration that circumstances exist justifying the authorization  of  emergency use of in vitro diagnostic tests for detection and or  diagnosis of SARS CoV 2 under Section 564(b)(1) of the Act, 21 U.S.C.  section [phone] 3(b)(1), unless the authorization is  terminated or   revoked sooner. [facility] is certified under the  Clinical Laboratory Improvement Amendments of 1988 (CLIA), 42 U.S.C.  section [phone], to perform high complexity tests. Performed at [place] [id number] [address], [place], [place] [address] Laboratory Director: Loleta Books, MD Performed at [facility], [place] [address] [place], [place] [address]    Coronavirus Source NASOPHARYNGEAL  Final    Comment: Performed at [facility], [place] [address], [place], [place] [address]  Novel Coronavirus, NAA (hospital order; send-out to ref lab)     Status: None   Collection Time: 05/01/18 12:46 PM  Result Value Ref Range Status   SARS-CoV-2, NAA NOT DETECTED NOT DETECTED Final    Comment: Negative (Not Detected) results do not exclude infection caused by SARS CoV 2 and should not be used as the sole basis for treatment or other patient management decisions. Optimum specimen types and timing for peak viral levels during infections caused  by SARS CoV 2 have not been determined. Collection of multiple specimens (types and time points) from the same patient may be necessary to detect the virus. Improper specimen collection and handling, sequence variability underlying assay primers and or probes, or the presence of organisms in  quantities less than the limit of detection of the assay may lead to false negative results. Positive and negative predictive values of testing are highly dependent on prevalence. False negative results are more likely when prevalence of disease is high. (NOTE) The expected result is Negative (Not Detected). The SARS CoV 2 test is intended for the presumptive qualitative  detection of nucleic acid from SARS CoV 2 in upper and lower  respir atory specimens. Testing methodology is real time RT PCR. Test results must be correlated with clinical presentation and  evaluated  in the context of other laboratory and epidemiologic data.  Test performance can be affected because the epidemiology and  clinical spectrum of infection caused by SARS CoV 2 is not fully  known. For example, the optimum types of specimens to collect and  when during the course of infection these specimens are most likely  to contain detectable viral RNA may not be known. This test has not been Food and Drug Administration (FDA) cleared or  approved and has been authorized by FDA under an Emergency Use  Authorization (EUA). The test is only authorized for the duration of  the declaration that circumstances exist justifying the authorization  of emergency use of in vitro diagnostic tests for detection and or  diagnosis of SARS CoV 2 under Section 564(b)(1) of the Act, 21 U.S.C.  section [phone] 3(b)(1), unless the authorization is terminated or   revoked sooner. [facility] is certified under the  Clinical Laboratory Improvement Amendments of 1988 (CLIA), 42 U.S.C.  section [phone], to perform high complexity tests. Performed at [place] [id number] [address], [place], [place] [address] Laboratory Director: Loleta Books, MD Performed at [facility], [place] [address] [place], [place] [address]    Coronavirus Source NASOPHARYNGEAL  Final    Comment: Performed at [facility], [place] [address], [place], [place] [address]      Radiology  Studies: No results found.   Medications:  Scheduled:  sodium chloride   Intravenous Once   chlorhexidine  15 mL Mouth Rinse BID   Chlorhexidine Gluconate Cloth  6 each Topical Daily   chlorpheniramine-HYDROcodone  5 mL Oral Q12H   dextromethorphan-guaiFENesin  1 tablet Oral BID   feeding supplement (ENSURE ENLIVE)  237 mL Oral Q24H   feeding supplement (PRO-STAT SUGAR FREE 64)  30 mL Oral Daily   insulin aspart  0-15 Units Subcutaneous TID WC    insulin detemir  10 Units Subcutaneous Daily   magnesium oxide  400 mg Oral Daily   mouth rinse  15 mL Mouth Rinse BID   mouth rinse  15 mL Mouth Rinse q12n4p   metoprolol tartrate  50 mg Oral BID   montelukast  10 mg Oral Daily   multivitamin with minerals  1 tablet Oral Daily   pantoprazole  40 mg Oral BID   potassium chloride  40 mEq Oral Daily   Continuous:  PPI:[id number], alum & mag hydroxide-simeth, benzonatate, cyclobenzaprine, metoprolol tartrate, ondansetron (ZOFRAN) IV, sodium chloride flush    Assessment/Plan:  Acute respiratory failure with hypoxia/recently treated pneumonia Patient recently treated for pneumonia in the beginning of March.  Chest x-ray on admission actually showed improvement .  On 3/28 patient became acutely hypoxic and developed a fever.  Chest x-ray showed layering pleural effusion.  120 cc of fluid removed with the thoracentesis.  Cytology has been negative.  Cultures have been negative so far.  Fluid was thought to be exudative in nature.  Patient was continued on IV antibiotics.  COVID test negative, x2. Discontinued airborne plus contact precautions.  Influenza and respiratory viral pathogen panel was negative.  Currently on 4 L of oxygen by nasal cannula.  Saturating about 95%.    Persistent fever Patient continues to have febrile episodes.  T-max of 101.3 at 8 PM on 4/4.  Taken off of antibiotics per ID yesterday.  Continue to monitor for now.  WBC 12.9 this morning.  Wait for ID input today.  Supraventricular tachycardia Patient had frequent episodes of SVT while she was in the stepdown unit.  The rhythm was resistant to intravenous beta-blocker.  Patient had to be given adenosine with which she converted to sinus tachycardia.  She remains on oral metoprolol.  Has been in sinus rhythm.  Telemetry does not show any episodes of SVT.   Echocardiogram from February showed normal systolic function.  TSH was normal when checked in March.  Monitor  electrolytes.  Retropharyngeal fluid collection/tender cervical lymphadenopathy Patient was seen by ENT.  The fluid collection apparently has clinically improved.  Neck pain and swelling has also significantly improved.  No ENT intervention is recommended.  ENT has signed off  History of COPD/asthma Stable.  See above.  Diabetes mellitus type 2 Patient has had a few episodes of hypoglycemia likely due to infection and poor oral intake.  Now hyperglycemic.  Continue Levemir.  Continue SSI.    History of chronic diastolic CHF Patient appears to be euvolemic to hypovolemic.  Continue to monitor for now.  Essential hypertension Blood pressure is reasonably well controlled.  Metastatic breast cancer Oncology was notified.  Pancytopenia Patient has chronic thrombocytopenia.  Also has chemotherapy-induced anemia.  Hemoglobin noted to be 6.9 this morning.  No overt bleeding has been noted.  She will be transfused 1 unit of blood to begin with.  Platelet counts noted to be 13,000.  Since there is no bleeding we will transfuse only  if it drops below 10,000.  Hypokalemia Continue to maintain potassium greater than 4 and magnesium greater than 2.  Check labs tomorrow.  DVT Prophylaxis: SCDs    Code Status: Patient is currently full code Family Communication: Discussed with the patient, patient's mother and sister Disposition Plan: Management as outlined above.  Wait for improvement in clinical status.  Wait for fevers to subside.     LOS: 10 days   Andreus Cure [employer] on [email or web address]  05/04/2018, 11:27 AM

## 2018-05-04 NOTE — Progress Notes (Signed)
CRITICAL VALUE ALERT  Critical Value:  Hgb 6.9  Date & Time Notied:  05/04/2018  Provider Notified: Kennon Holter   Orders Received/Actions taken:   Rosine Beat, RN

## 2018-05-04 NOTE — Progress Notes (Signed)
PT Cancellation Note  Patient Details Name: Stacy Shelton MRN: [id number] DOB: 1960-06-14   Cancelled Treatment:     PT order received but eval deferred in am 2* transfusion in progress and in pm 2* pt c/o fatigue.  Pt states "I promise I will get up tomorrow morning".  Will follow.   Ellyssa Zagal 05/04/2018, 3:00 PM

## 2018-05-05 ENCOUNTER — Inpatient Hospital Stay (HOSPITAL_COMMUNITY): Payer: BLUE CROSS/BLUE SHIELD

## 2018-05-05 DIAGNOSIS — D72829 Elevated white blood cell count, unspecified: Secondary | ICD-10-CM

## 2018-05-05 DIAGNOSIS — Z9981 Dependence on supplemental oxygen: Secondary | ICD-10-CM

## 2018-05-05 DIAGNOSIS — I509 Heart failure, unspecified: Secondary | ICD-10-CM

## 2018-05-05 DIAGNOSIS — E118 Type 2 diabetes mellitus with unspecified complications: Secondary | ICD-10-CM

## 2018-05-05 DIAGNOSIS — L659 Nonscarring hair loss, unspecified: Secondary | ICD-10-CM

## 2018-05-05 DIAGNOSIS — Z95828 Presence of other vascular implants and grafts: Secondary | ICD-10-CM

## 2018-05-05 LAB — BASIC METABOLIC PANEL
Anion gap: 10 (ref 5–15)
BUN: 13 mg/dL (ref 6–20)
CO2: 30 mmol/L (ref 22–32)
Calcium: 7.9 mg/dL — ABNORMAL LOW (ref 8.9–10.3)
Chloride: 97 mmol/L — ABNORMAL LOW (ref 98–111)
Creatinine, Ser: 0.62 mg/dL (ref 0.44–1.00)
GFR calc Af Amer: >60 mL/min (ref >60)
GFR calc non Af Amer: >60 mL/min (ref >60)
Glucose, Bld: 176 mg/dL — ABNORMAL HIGH (ref 70–99)
Potassium: 4 mmol/L (ref 3.5–5.1)
Sodium: 137 mmol/L (ref 135–145)

## 2018-05-05 LAB — TYPE AND SCREEN
ABO/RH(D): A POS
Antibody Screen: NEGATIVE
Unit division: 0

## 2018-05-05 LAB — CBC WITH DIFFERENTIAL/PLATELET
Abs Immature Granulocytes: 2.77 10*3/uL — ABNORMAL HIGH (ref 0.00–0.07)
Basophils Absolute: 0.5 10*3/uL — ABNORMAL HIGH (ref 0.0–0.1)
Basophils Relative: 4 %
Eosinophils Absolute: 0 10*3/uL (ref 0.0–0.5)
Eosinophils Relative: 0 %
HCT: 28.8 % — ABNORMAL LOW (ref 36.0–46.0)
Hemoglobin: 9 g/dL — ABNORMAL LOW (ref 12.0–15.0)
Immature Granulocytes: 21 %
Lymphocytes Relative: 10 %
Lymphs Abs: 1.3 10*3/uL (ref 0.7–4.0)
MCH: 28.1 pg (ref 26.0–34.0)
MCHC: 31.3 g/dL (ref 30.0–36.0)
MCV: 90 fL (ref 80.0–100.0)
Monocytes Absolute: 0.8 10*3/uL (ref 0.1–1.0)
Monocytes Relative: 6 %
Neutro Abs: 7.7 10*3/uL (ref 1.7–7.7)
Neutrophils Relative %: 59 %
Platelets: 16 10*3/uL — CL (ref 150–400)
RBC: 3.2 MIL/uL — ABNORMAL LOW (ref 3.87–5.11)
RDW: 17.8 % — ABNORMAL HIGH (ref 11.5–15.5)
WBC: 13 10*3/uL — ABNORMAL HIGH (ref 4.0–10.5)
nRBC: 0.2 % (ref 0.0–0.2)

## 2018-05-05 LAB — GLUCOSE, CAPILLARY
Glucose-Capillary: 158 mg/dL — ABNORMAL HIGH (ref 70–99)
Glucose-Capillary: 181 mg/dL — ABNORMAL HIGH (ref 70–99)
Glucose-Capillary: 146 mg/dL — ABNORMAL HIGH (ref 70–99)
Glucose-Capillary: 161 mg/dL — ABNORMAL HIGH (ref 70–99)

## 2018-05-05 LAB — BPAM RBC
Blood Product Expiration Date: 202004152359
ISSUE DATE / TIME: 202004051057
Unit Type and Rh: 6200

## 2018-05-05 LAB — MAGNESIUM: Magnesium: 1.9 mg/dL (ref 1.7–2.4)

## 2018-05-05 MED ORDER — ENSURE ENLIVE PO LIQD
237.0000 mL | Freq: Two times a day (BID) | ORAL | Status: DC
  Administered 2018-05-05 – 2018-05-06 (×2): 237 mL via ORAL

## 2018-05-05 NOTE — Progress Notes (Signed)
[facility]   Reason for visit: Follow up on fever, pleural effusion/PNA  Interval History: pt remains on O2 via [place] but did ambulate today using her walker and is anxious to go home soon; no further fever since yesterday morning despite ABX remaining on hold; repeat CXR obtained today; appetite slowly improving but volitionally still consuming liquids and soft foods only still due to prior throat swelling/pain  CXR independently reviewed   Physical Exam: Vitals:   05/05/18 0557 05/05/18 1334  BP: (!) 116/56 (!) 127/55  Pulse: 93 78  Resp: 18 18  Temp: 98.4 F (36.9 C) (!) 97.4 F (36.3 C)  SpO2: 94% 99%  Lines: + chest wall portocath w/o TTP or erythema Constitutional: patient appears chronically ill with alopecia in mild respiratory distress Eyes: anicteric, EOMI, PERRL HENT: no thrush, MMM Respiratory: rare abdominal retractions with occasional nasal flaring, CTA B Cardiovascular: RRR, no murmurs audible GI: soft, nt, nd, +BS Extrems: trace LE edema, 2+ pulses throughout Skin: no rashes  Review of Systems: Constitutional: negative All other systems reviewed and negative except as noted above  Lab Results  Component Value Date   WBC 13.0 (H) 05/05/2018   HGB 9.0 (L) 05/05/2018   HCT 28.8 (L) 05/05/2018   MCV 90.0 05/05/2018   PLT 16 (LL) 05/05/2018    Lab Results  Component Value Date   CREATININE 0.62 05/05/2018   BUN 13 05/05/2018   NA 137 05/05/2018   K 4.0 05/05/2018   CL 97 (L) 05/05/2018   CO2 30 05/05/2018    Lab Results  Component Value Date   ALT 13 05/02/2018   AST 25 05/02/2018   ALKPHOS 196 (H) 05/02/2018     Microbiology: Recent Results (from the past 240 hour(s))  Culture, body fluid-bottle     Status: None   Collection Time: 04/27/18  9:50 AM  Result Value Ref Range Status   Specimen Description PLEURAL LEFT  Final   Special Requests NONE  Final   Culture   Final    NO GROWTH 5 DAYS Performed at [facility], [address] [place], [place] [address]    Report Status 05/02/2018 FINAL  Final  Gram stain     Status: None   Collection Time: 04/27/18  9:50 AM  Result Value Ref Range Status   Specimen Description PLEURAL LEFT  Final   Special Requests NONE  Final   Gram Stain   Final    MODERATE WBC PRESENT,BOTH PMN AND MONONUCLEAR NO ORGANISMS SEEN Performed at [facility], [place] [address] [place], [place] [address]    Report Status 04/27/2018 FINAL  Final  Novel Coronavirus, NAA (hospital order; send-out to ref lab)     Status: None   Collection Time: 04/28/18  9:26 AM  Result Value Ref Range Status   SARS-CoV-2, NAA NOT DETECTED NOT DETECTED Final    Comment: Negative (Not Detected) results do not exclude infection caused by SARS CoV 2 and should not be used as the sole basis for treatment or other patient management decisions. Optimum specimen types and timing for peak viral levels during infections caused  by SARS CoV 2 have not been determined. Collection of multiple specimens (types and time points) from the same patient may be necessary to detect the virus. Improper specimen collection and handling, sequence variability underlying assay primers and or probes, or the presence of organisms in  quantities less than the limit of detection of  the assay may lead to false negative results. Positive and negative predictive values of testing are highly dependent on prevalence. False negative results are more likely when prevalence of disease is high. (NOTE) The expected result is Negative (Not Detected). The SARS CoV 2 test is intended for the presumptive qualitative  detection of nucleic acid from SARS CoV 2 in upper and lower  respir atory specimens. Testing methodology is real time RT PCR. Test results must be correlated with clinical presentation and  evaluated in the context of other laboratory and epidemiologic data.  Test performance can be affected because the  epidemiology and  clinical spectrum of infection caused by SARS CoV 2 is not fully  known. For example, the optimum types of specimens to collect and  when during the course of infection these specimens are most likely  to contain detectable viral RNA may not be known. This test has not been Food and Drug Administration (FDA) cleared or  approved and has been authorized by FDA under an Emergency Use  Authorization (EUA). The test is only authorized for the duration of  the declaration that circumstances exist justifying the authorization  of emergency use of in vitro diagnostic tests for detection and or  diagnosis of SARS CoV 2 under Section 564(b)(1) of the Act, 21 U.S.C.  section [phone] 3(b)(1), unless the authorization is terminated or   revoked sooner. [facility] is certified under the  Clinical Laboratory Improvement Amendments of 1988 (CLIA), 42 U.S.C.  section [phone], to perform high complexity tests. Performed at [facility] [id number] [address], [place], [place] [address] Laboratory Director: Loleta Books, MD Performed at [facility], [place] [address], [place], [place] [address]    Coronavirus Source NASOPHARYNGEAL  Final    Comment: Performed at [facility], [place] [address], [place], [place] [address]  Novel Coronavirus, NAA (hospital order; send-out to ref lab)     Status: None   Collection Time: 05/01/18 12:46 PM  Result Value Ref Range Status   SARS-CoV-2, NAA NOT DETECTED NOT DETECTED Final    Comment: Negative (Not Detected) results do not exclude infection caused by SARS CoV 2 and should not be used as the sole basis for treatment or other patient management decisions. Optimum specimen types and timing for peak viral levels during infections caused  by SARS CoV 2 have not been determined. Collection of multiple specimens (types and time points) from the same patient may be  necessary to detect the virus. Improper specimen collection and handling, sequence variability underlying assay primers and or probes, or the presence of organisms in  quantities less than the limit of detection of the assay may lead to false negative results. Positive and negative predictive values of testing are highly dependent on prevalence. False negative results are more likely when prevalence of disease is high. (NOTE) The expected result is Negative (Not Detected). The SARS CoV 2 test is intended for the presumptive qualitative  detection of nucleic acid from SARS CoV 2 in upper and lower  respir atory specimens. Testing methodology is real time RT PCR. Test results must be correlated with clinical presentation and  evaluated in the context of other laboratory and epidemiologic data.  Test performance can be affected because the epidemiology and  clinical spectrum of infection caused by SARS CoV 2 is not fully  known. For example, the optimum types of specimens to collect and  when during  the course of infection these specimens are most likely  to contain detectable viral RNA may not be known. This test has not been Food and Drug Administration (FDA) cleared or  approved and has been authorized by FDA under an Emergency Use  Authorization (EUA). The test is only authorized for the duration of  the declaration that circumstances exist justifying the authorization  of emergency use of in vitro diagnostic tests for detection and or  diagnosis of SARS CoV 2 under Section 564(b)(1) of the Act, 21 U.S.C.  section [phone] 3(b)(1), unless the authorization is terminated or   revoked sooner. [facility] is certified under the  Clinical Laboratory Improvement Amendments of 1988 (CLIA), 42 U.S.C.  section [phone], to perform high complexity tests. Performed at [facility] [id number] [address], [place], [place] [address] Laboratory  Director: Loleta Books, MD Performed at [facility], [place] [address], [place], [place] [address]    Coronavirus Source NASOPHARYNGEAL  Final    Comment: Performed at [facility], [place] [address], [place], [place] [address]    Impression/Plan: Pt is a 58 y/o WF with metastatic breast CA on recent chemotherapy, uncontrolled DM, and CHF admitted with fever, concern for retropharyngeal abscess, LLL PNA with LT parapneumonic effusion.  1. Fever - resolved over the past 36 hrs while ABX continue to be held. Blood cxs and sputum/pleural fluid cxs all unrevealing at present, although admittedly pleural fluid cxs were obtained while pt had already been on several days of broad spectrum ABX. As GI sxs appear to have abbated, less concern for descending mediastinitis, particularly as f/u CTA of chest failed to show this process. Testing for CoVID-19 was negative on this admission. Pt is now s/p 10 days of vanc/zosyn for complicated LLL PNA with large pleural effusion, which has subsequently been drained. In the event that fever recurs, she will likely require 3+ weeks of broad spectrum ABX as an OP. Cannot R/O malignant effusion or oncologic origin of her malignancy at this time as her repeat imaging does suggest persistent, active oncologic process. If she remains afebrile for next 24 hrs, may consider hospital d/c with close f/u with her oncologist.  2. Leukocytosis - WBCs elevated over past few days yet repeat CXR shows improving PNA w/o recurrence of pleural effusion following thoracentesis last week. ?late effect of neupogen/neulasta given with recent chemotherapy as pt appears clinically stable at this time. Check CBC w/ diff in AM to re-assess now that ABX have been held.

## 2018-05-05 NOTE — Progress Notes (Signed)
Nutrition Follow-up  RD working remotely.   DOCUMENTATION CODES:   (Unable to assess for malnutrition at this time.)  INTERVENTION:  - Continue Ensure Enlive but will increase to BID. - Continue prostat once/day. - Continue to encourage PO intakes.   NUTRITION DIAGNOSIS:   Increased nutrient needs related to chronic illness, catabolic illness, cancer and cancer related treatments as evidenced by estimated needs. -ongoing  GOAL:   Patient will meet greater than or equal to 90% of their needs -unmet on average  MONITOR:   PO intake, Supplement acceptance, Weight trends, Labs  ASSESSMENT:   58 year old with past medical history relevant for metastatic breast cancer, type 2 DM, CHF, and HTN who was initially hospitalized from 3/9-3/18 for septic shock thought to be 2/2 pneumonia. She presented to the ED with recurrent fevers and cough.  Weight trending back up and is now +5.1 kg/11 lb compared to admission weight. Last documented meal completion was 20% for dinner on 4/4. Per review of orders, patient has accepted all bottles of Ensure Enlive and 9 of 11 packets of prostat offered.   Per Dr. Judie Bonus note this AM: plan to remain inpatient until improvement seen in clinical status.     Medications reviewed; sliding scale novolog, 10 units levemir/day, 400 mg mag-ox/day, daily multivitamin with minerals, 40 mg oral protonix BID, 40 mEq oral KCl/day.  Labs reviewed; CBGs: 158 and 181 mg/dl today, Cl: 97 mmol/l, Ca: 7.9 mg/dl.    Diet Order:   Diet Order            Diet Carb Modified Fluid consistency: Thin; Room service appropriate? Yes  Diet effective now              EDUCATION NEEDS:   Not appropriate for education at this time  Skin:  Skin Assessment: Reviewed RN Assessment  Last BM:  3/30  Height:   Ht Readings from Last 1 Encounters:  05/03/18 5\' 2"  (1.575 m)    Weight:   Wt Readings from Last 1 Encounters:  05/03/18 71.4 kg    Ideal Body Weight:   52.27 kg  BMI:  Body mass index is 28.79 kg/m.  Estimated Nutritional Needs:   Kcal:  8016-5537 kcal  Protein:  115-125 grams  Fluid:  >/= 2 L/day     Jarome Matin, MS, RD, LDN, [facility] Inpatient Clinical Dietitian Pager # [phone] After hours/weekend pager # [phone]

## 2018-05-05 NOTE — Evaluation (Signed)
Physical Therapy Evaluation Patient Details Name: Stacy Shelton MRN: [id number] DOB: 1960/11/08 Today's Date: 05/05/2018   History of Present Illness  58 yo female admitted with pleural effusion, fever, respiratory failure, sepsis. Hx of met breast cancer, DM, HF  Clinical Impression  On eval, pt required Min assist for mobility. She walked ~35 feet with a RW. Pt presents with general weakness, decreased activity tolerance, and impaired gait and balance. Pt remained on 4L [place] O2 during session-O2 sat level 94%. Will continue to follow and progress activity as tolerated.     Follow Up Recommendations Home health PT;Supervision/Assistance - 24 hour    Equipment Recommendations  Rolling walker with 5" wheels    Recommendations for Other Services       Precautions / Restrictions Precautions Precautions: Fall Precaution Comments: monitor O2 Restrictions Weight Bearing Restrictions: No      Mobility  Bed Mobility Overal bed mobility: Needs Assistance Bed Mobility: Supine to Sit;Sit to Supine     Supine to sit: HOB elevated;Min guard Sit to supine: HOB elevated;Min guard   General bed mobility comments: close guard for safety. Pt relied on bedrail.  Transfers Overall transfer level: Needs assistance Equipment used: Rolling walker (2 wheeled) Transfers: Sit to/from Stand Sit to Stand: Min assist;From elevated surface         General transfer comment: Assist to rise, stabilize, control descent. VCs safety, technique, hand placement.   Ambulation/Gait Ambulation/Gait assistance: Min assist Gait Distance (Feet): 35 Feet Assistive device: Rolling walker (2 wheeled) Gait Pattern/deviations: Step-through pattern;Decreased stride length     General Gait Details: Assist to stabilize pt throughout distance. Slow gait speed. 2 brief standing restbreaks. O2 sat 94% on 4L [place]. Followed with recliner.   Stairs            Wheelchair Mobility    Modified Rankin (Stroke  Patients Only)       Balance Overall balance assessment: Needs assistance         Standing balance support: Bilateral upper extremity supported Standing balance-Leahy Scale: Poor                               Pertinent Vitals/Pain Pain Assessment: No/denies pain    Home Living Family/patient expects to be discharged to:: Private residence Living Arrangements: Parent Available Help at Discharge: Family Type of Home: House Home Access: Stairs to enter Entrance Stairs-Rails: Right Entrance Stairs-Number of Steps: 2-3 Home Layout: Able to live on main level with bedroom/bathroom Home Equipment: None Additional Comments: has old walker that father used    Prior Function Level of Independence: Independent               Hand Dominance        Extremity/Trunk Assessment   Upper Extremity Assessment Upper Extremity Assessment: Defer to OT evaluation    Lower Extremity Assessment Lower Extremity Assessment: Generalized weakness    Cervical / Trunk Assessment Cervical / Trunk Assessment: Normal  Communication   Communication: No difficulties  Cognition Arousal/Alertness: Awake/alert Behavior During Therapy: WFL for tasks assessed/performed Overall Cognitive Status: Within Functional Limits for tasks assessed                                        General Comments      Exercises     Assessment/Plan    PT Assessment Patient  needs continued PT services  PT Problem List Decreased strength;Decreased balance;Decreased mobility;Decreased activity tolerance;Decreased knowledge of use of DME       PT Treatment Interventions DME instruction;Gait training;Therapeutic activities;Functional mobility training;Balance training;Patient/family education;Therapeutic exercise    PT Goals (Current goals can be found in the Care Plan section)  Acute Rehab PT Goals Patient Stated Goal: to regain PLOF PT Goal Formulation: With patient Time  For Goal Achievement: 05/19/18 Potential to Achieve Goals: Good    Frequency Min 3X/week   Barriers to discharge        Co-evaluation               AM-PAC PT "6 Clicks" Mobility  Outcome Measure Help needed turning from your back to your side while in a flat bed without using bedrails?: A Little Help needed moving from lying on your back to sitting on the side of a flat bed without using bedrails?: A Little Help needed moving to and from a bed to a chair (including a wheelchair)?: A Little Help needed standing up from a chair using your arms (e.g., wheelchair or bedside chair)?: A Little Help needed to walk in hospital room?: A Little Help needed climbing 3-5 steps with a railing? : A Lot 6 Click Score: 17    End of Session Equipment Utilized During Treatment: Gait belt;Oxygen Activity Tolerance: Patient tolerated treatment well Patient left: in bed;with call bell/phone within reach;with bed alarm set   PT Visit Diagnosis: Muscle weakness (generalized) (M62.81);Unsteadiness on feet (R26.81);Difficulty in walking, not elsewhere classified (R26.2)    Time: 3825-0539 PT Time Calculation (min) (ACUTE ONLY): 26 min   Charges:   PT Evaluation $PT Eval Moderate Complexity: 1 Mod PT Treatments $Gait Training: 8-22 mins        Weston Anna, PT Acute Rehabilitation Services Pager: [phone] Office: [phone]

## 2018-05-05 NOTE — Progress Notes (Signed)
PROGRESS NOTE  Stacy Shelton WUJ:[id number] DOB: Apr 04, 1960 DOA: 04/24/2018 PCP: Robyne Peers, MD   LOS: 11 days   Brief narrative: 58 year old with past medical history relevant for metastatic breast cancer, type 2 diabetes, chronic diastolic heart failure, hypertension who was initially hospitalized from 04/07/2018 to 04/16/2018 for septic shock thought to be secondary to pneumonia who presented to the emergency department with recurrent fevers and cough and found to have retropharyngeal fluid collection with extension of inflammation around right carotid. Her course has been complicated by acute hypoxic respiratory failure with left pleural effusion.  Subjective: Patient was seen and examined this morning.  Pleasant middle-aged Caucasian female.  Feels very tired.  Does not want to work with physical therapy today.  T-max 100.1 last night.  Assessment/Plan:  Principal Problem:   Fever Active Problems:   Uncontrolled diabetes mellitus with hyperglycemia (HCC)   Metastatic breast cancer (HCC)   Acute respiratory failure with hypoxia (HCC)   Left lower lobe pneumonia (HCC)   Chronic diastolic CHF (congestive heart failure) (HCC)   Anemia associated with chemotherapy   Chemotherapy-induced thrombocytopenia   Hypoxia   SVT (supraventricular tachycardia) (HCC)   Increased oxygen demand   Tachypnea  Acute respiratory failure with hypoxia/recently treated pneumonia Patient recently treated for pneumonia in the beginning of March. Chest x-ray on admission actually showed improvement. On 3/28 patient became acutely hypoxic and developed a fever. Chest x-ray showed layering pleural effusion.  120 cc of fluid removed with the thoracentesis.  Cytology has been negative.  Cultures have been negative so far.  Fluid was thought to be exudative in nature.  Patient was continued on IV antibiotics.  COVID test negative x 2. Discontinued airborne plus contact precautions.  Influenza and respiratory  viral pathogen panel was negative.  Currently on 4 L of oxygen by nasal cannula.  Saturating about 95%.   Repeat CXR this am showed improved expansion of both lungs and improved aeration of the left lower lobe. Bilateral pneumonia remains, right greater than left. Very small bilateral pleural effusions.  Persistent fever Patient continues to have febrile episodes. T-max of 101.1 in last 24 hrs. Taken off of antibiotics per ID on 4/4. Continue to monitor for now. WBC 13 this morning. ID following.  Supraventricular tachycardia Patient had frequent episodes of SVT while she was in the stepdown unit.  The rhythm was resistant to intravenous beta-blocker. Patient had to be given adenosine with which she converted to sinus tachycardia.  She remains on oral metoprolol.  Has been in sinus rhythm.  Telemetry does not show any episodes of SVT.   Echocardiogram from February showed normal systolic function.  TSH was normal when checked in March.  Monitor electrolytes.  Retropharyngeal fluid collection/tender cervical lymphadenopathy Patient was seen by ENT.  The fluid collection apparently has clinically improved.  Neck pain and swelling has also significantly improved.  No ENT intervention is recommended.  ENT has signed off  History of COPD/asthma Stable.  See above.  Diabetes mellitus type 2 Patient has had a few episodes of hypoglycemia likely due to infection and poor oral intake.  Now hyperglycemic.  Continue Levemir.  Continue SSI.    History of chronic diastolic CHF Patient appears to be euvolemic to hypovolemic.  Continue to monitor for now.  Essential hypertension Blood pressure is reasonably well controlled.  Metastatic breast cancer Oncology was notified.  Pancytopenia Patient has chronic thrombocytopenia.  Also has chemotherapy-induced anemia. Hemoglobin noted to be 6.9 this morning.  No overt  bleeding has been noted.  She will be transfused 1 unit of blood to begin with.  Platelet counts noted to be 16,000.  Since there is no bleeding we will transfuse only if it drops below 10,000.  Hypokalemia Continue to maintain potassium greater than 4 and magnesium greater than 2.  Check labs tomorrow.  Mobility: Encourage participation with PT Diet: Carb modified diet DVT prophylaxis:  SCDs Code Status:   Code Status: Full Code  Family Communication:  None at bedside Disposition Plan: Management as outlined above.  Wait for improvement in clinical status.  Wait for fevers to subside.  Antimicrobials:  Anti-infectives (From admission, onward)   Start     Dose/Rate Route Frequency Ordered Stop   04/30/18 1000  vancomycin (VANCOCIN) IVPB 750 mg/150 ml premix  Status:  Discontinued     750 mg 150 mL/hr over 60 Minutes Intravenous Every 12 hours 04/30/18 0849 05/03/18 1034   04/28/18 1800  vancomycin (VANCOCIN) IVPB 1000 mg/200 mL premix  Status:  Discontinued     1,000 mg 200 mL/hr over 60 Minutes Intravenous Every 24 hours 04/28/18 1748 04/30/18 0849   04/28/18 1354  vancomycin variable dose per unstable renal function (pharmacist dosing)  Status:  Discontinued      Does not apply See admin instructions 04/28/18 1354 04/28/18 1748   04/26/18 2200  piperacillin-tazobactam (ZOSYN) IVPB 3.375 g  Status:  Discontinued     3.375 g 12.5 mL/hr over 240 Minutes Intravenous Every 8 hours 04/26/18 1602 05/03/18 1034   04/26/18 2200  vancomycin (VANCOCIN) IVPB 750 mg/150 ml premix  Status:  Discontinued     750 mg 150 mL/hr over 60 Minutes Intravenous 2 times daily 04/26/18 1602 04/28/18 0914   04/25/18 2000  piperacillin-tazobactam (ZOSYN) IVPB 3.375 g  Status:  Discontinued     3.375 g 12.5 mL/hr over 240 Minutes Intravenous Every 8 hours 04/25/18 1239 04/26/18 1602   04/25/18 1245  piperacillin-tazobactam (ZOSYN) IVPB 3.375 g  Status:  Discontinued     3.375 g 100 mL/hr over 30 Minutes Intravenous Every 6 hours 04/25/18 1236 04/25/18 1237   04/24/18 1800  ceFEPIme  (MAXIPIME) 1 g in sodium chloride 0.9 % 100 mL IVPB  Status:  Discontinued     1 g 200 mL/hr over 30 Minutes Intravenous Every 8 hours 04/24/18 1504 04/25/18 1236   04/24/18 1800  vancomycin (VANCOCIN) IVPB 750 mg/150 ml premix  Status:  Discontinued     750 mg 150 mL/hr over 60 Minutes Intravenous Every 12 hours 04/24/18 1525 04/26/18 1602   04/24/18 0915  vancomycin (VANCOCIN) IVPB 1000 mg/200 mL premix     1,000 mg 200 mL/hr over 60 Minutes Intravenous  Once 04/24/18 0904 04/24/18 1129   04/24/18 0915  ceFEPIme (MAXIPIME) 2 g in sodium chloride 0.9 % 100 mL IVPB     2 g 200 mL/hr over 30 Minutes Intravenous  Once 04/24/18 0904 04/24/18 1024      Infusions:    Scheduled Meds:  sodium chloride   Intravenous Once   benzonatate  200 mg Oral TID   chlorhexidine  15 mL Mouth Rinse BID   Chlorhexidine Gluconate Cloth  6 each Topical Daily   chlorpheniramine-HYDROcodone  5 mL Oral Q12H   feeding supplement (ENSURE ENLIVE)  237 mL Oral Q24H   feeding supplement (PRO-STAT SUGAR FREE 64)  30 mL Oral Daily   insulin aspart  0-15 Units Subcutaneous TID WC   insulin detemir  10 Units Subcutaneous Daily   magnesium  oxide  400 mg Oral Daily   mouth rinse  15 mL Mouth Rinse BID   mouth rinse  15 mL Mouth Rinse q12n4p   metoprolol tartrate  50 mg Oral BID   montelukast  10 mg Oral Daily   multivitamin with minerals  1 tablet Oral Daily   pantoprazole  40 mg Oral BID   potassium chloride  40 mEq Oral Daily    PRN meds: acetaminophen, alum & mag hydroxide-simeth, cyclobenzaprine, metoprolol tartrate, ondansetron (ZOFRAN) IV, sodium chloride flush   Objective: Vitals:   05/04/18 2042 05/05/18 0557  BP: (!) 131/54 (!) 116/56  Pulse: 89 93  Resp: (!) 36 18  Temp: 98.1 F (36.7 C) 98.4 F (36.9 C)  SpO2: 98% 94%    Intake/Output Summary (Last 24 hours) at 05/05/2018 1152 Last data filed at 05/04/2018 1440 Gross per 24 hour  Intake 467 ml  Output --  Net 467 ml    Filed Weights   04/30/18 0643 05/02/18 0418 05/03/18 1428  Weight: 71.3 kg 69.3 kg 71.4 kg   Weight change:  Body mass index is 28.79 kg/m.   Physical Exam: General exam: Appears calm and comfortable but feels very tired Skin: No rashes, lesions or ulcers. HEENT: Normal  Lungs: clear to auscultation bilaterally CVS:  Regular rate and rhythm GI/Abd soft, nondistended, nontender, bowel sound present CNS: Alert, calm oriented x3 Psychiatry: Mood & affect appropriate.  Extremities: No pedal edema, no calf tenderness  Data Review: I have personally reviewed the laboratory data and studies available.  Recent Labs  Lab 04/29/18 0220 04/30/18 0308 05/01/18 0436 05/02/18 0100 05/04/18 0344 05/04/18 1658 05/05/18 0318  WBC 8.6 7.4 7.1 14.3* 12.9*  --  13.0*  NEUTROABS 6.0 4.9  --  9.6*  --   --  7.7  HGB 8.0* 8.1* 7.4* 8.5* 6.9* 8.3* 9.0*  HCT 25.7* 27.3* 24.2* 27.1* 22.0* 26.1* 28.8*  MCV 88.9 90.7 89.3 89.1 90.9  --  90.0  PLT 30* 33* 21* 25* 14*  --  16*   Recent Labs  Lab 04/29/18 0220 04/30/18 0308 05/01/18 0436 05/02/18 0100 05/03/18 0853 05/04/18 0344 05/05/18 0318  NA 137 141 139 137  --   --  137  K 3.5 2.9* 3.0* 3.9  --   --  4.0  CL 103 102 98 96*  --   --  97*  CO2 25 31 31  32  --   --  30  GLUCOSE 219* 156* 185* 297*  --   --  176*  BUN 18 16 19  24*  --   --  13  CREATININE 0.75 0.78 0.70 0.69 0.71 0.54 0.62  CALCIUM 8.1* 8.1* 8.0* 8.1*  --   --  7.9*  MG  --  1.8  --  1.8  --   --  1.9    Terrilee Croak, MD  [facility] 05/05/2018

## 2018-05-05 NOTE — Evaluation (Signed)
Occupational Therapy Evaluation Patient Details Name: Stacy Shelton MRN: [id number] DOB: December 01, 1960 Today's Date: 05/05/2018    History of Present Illness 58 yo female admitted with pleural effusion, fever, respiratory failure, sepsis. Hx of met breast cancer, DM, HF   Clinical Impression   Pt admitted with pleural effusion. Pt currently with functional limitations due to the deficits listed below (see OT Problem List).  Pt will benefit from skilled OT to increase their safety and independence with ADL and functional mobility for ADL to facilitate discharge to venue listed below.      Follow Up Recommendations  Home health OT;Supervision/Assistance - 24 hour    Equipment Recommendations  None recommended by OT    Recommendations for Other Services       Precautions / Restrictions Precautions Precautions: Fall Precaution Comments: monitor O2 Restrictions Weight Bearing Restrictions: No      Mobility Bed Mobility Overal bed mobility: Needs Assistance Bed Mobility: Supine to Sit;Sit to Supine     Supine to sit: HOB elevated;Min guard Sit to supine: HOB elevated;Min guard   General bed mobility comments: close guard for safety. Pt relied on bedrail.  Transfers Overall transfer level: Needs assistance Equipment used: Rolling walker (2 wheeled) Transfers: Sit to/from [facility] Sit to Stand: Min assist;From elevated surface Stand pivot transfers: Min assist       General transfer comment: Assist to rise, stabilize, control descent. VCs safety, technique, hand placement.     Balance Overall balance assessment: Needs assistance         Standing balance support: Bilateral upper extremity supported Standing balance-Leahy Scale: Poor                             ADL either performed or assessed with clinical judgement   ADL Overall ADL's : Needs assistance/impaired Eating/Feeding: Set up;Sitting   Grooming: Set up;Sitting   Upper Body  Bathing: Set up;Sitting   Lower Body Bathing: Moderate assistance;Sit to/from stand;Cueing for sequencing;Cueing for safety   Upper Body Dressing : Set up;Sitting   Lower Body Dressing: Moderate assistance;Sit to/from stand;Cueing for sequencing;Cueing for safety   Toilet Transfer: Minimal assistance;Stand-pivot;BSC   Toileting- Clothing Manipulation and Hygiene: Minimal assistance;Sit to/from stand;Cueing for sequencing;Cueing for safety       Functional mobility during ADLs: Minimal assistance;Cueing for safety;Cueing for sequencing;Rolling walker       Vision Baseline Vision/History: No visual deficits Patient Visual Report: No change from baseline              Pertinent Vitals/Pain Pain Assessment: 0-10 Pain Score: 3  Pain Location: ribs with coughing Pain Descriptors / Indicators: Discomfort Pain Intervention(s): Limited activity within patient's tolerance;Repositioned     Hand Dominance     Extremity/Trunk Assessment Upper Extremity Assessment Upper Extremity Assessment: Generalized weakness   Lower Extremity Assessment Lower Extremity Assessment: Generalized weakness   Cervical / Trunk Assessment Cervical / Trunk Assessment: Normal   Communication Communication Communication: No difficulties   Cognition Arousal/Alertness: Awake/alert Behavior During Therapy: WFL for tasks assessed/performed Overall Cognitive Status: Within Functional Limits for tasks assessed                                                Home Living Family/patient expects to be discharged to:: Private residence Living Arrangements: Parent Available Help at Discharge:  Family Type of Home: House Home Access: Stairs to enter Technical brewer of Steps: 2-3 Entrance Stairs-Rails: Right Home Layout: Able to live on main level with bedroom/bathroom               Home Equipment: None   Additional Comments: has old walker that father used      Prior  Functioning/Environment Level of Independence: Independent                 OT Problem List: Decreased strength;Decreased activity tolerance;Decreased safety awareness;Decreased knowledge of use of DME or AE      OT Treatment/Interventions: Self-care/ADL training;Patient/family education;DME and/or AE instruction    OT Goals(Current goals can be found in the care plan section) Acute Rehab OT Goals Patient Stated Goal: to regain PLOF OT Goal Formulation: With patient  OT Frequency: Min 2X/week   Barriers to D/C:               AM-PAC OT "6 Clicks" Daily Activity     Outcome Measure Help from another person eating meals?: A Little Help from another person taking care of personal grooming?: A Little Help from another person toileting, which includes using toliet, bedpan, or urinal?: A Little Help from another person bathing (including washing, rinsing, drying)?: A Little Help from another person to put on and taking off regular upper body clothing?: A Little Help from another person to put on and taking off regular lower body clothing?: A Lot 6 Click Score: 17   End of Session Nurse Communication: Mobility status  Activity Tolerance: Patient tolerated treatment well Patient left: in chair  OT Visit Diagnosis: Unsteadiness on feet (R26.81);Muscle weakness (generalized) (M62.81)                Time: 7026-3785 OT Time Calculation (min): 20 min Charges:  OT General Charges $OT Visit: 1 Visit OT Evaluation $OT Eval Moderate Complexity: 1 Mod  Kari Baars, OT Acute Rehabilitation Services [phone] Office- [phone]     Landrey Mahurin, Edwena Felty D 05/05/2018, 4:50 PM

## 2018-05-06 DIAGNOSIS — Z9889 Other specified postprocedural states: Secondary | ICD-10-CM

## 2018-05-06 LAB — CBC WITH DIFFERENTIAL/PLATELET
Abs Immature Granulocytes: 1.2 10*3/uL — ABNORMAL HIGH (ref 0.00–0.07)
Band Neutrophils: 4 %
Basophils Absolute: 0.1 10*3/uL (ref 0.0–0.1)
Basophils Relative: 1 %
Eosinophils Absolute: 0 10*3/uL (ref 0.0–0.5)
Eosinophils Relative: 0 %
HCT: 26.8 % — ABNORMAL LOW (ref 36.0–46.0)
Hemoglobin: 8.3 g/dL — ABNORMAL LOW (ref 12.0–15.0)
Lymphocytes Relative: 19 %
Lymphs Abs: 1.6 10*3/uL (ref 0.7–4.0)
MCH: 28.3 pg (ref 26.0–34.0)
MCHC: 31 g/dL (ref 30.0–36.0)
MCV: 91.5 fL (ref 80.0–100.0)
Metamyelocytes Relative: 2 %
Monocytes Absolute: 0.3 10*3/uL (ref 0.1–1.0)
Monocytes Relative: 4 %
Myelocytes: 12 %
Neutro Abs: 5.3 10*3/uL (ref 1.7–7.7)
Neutrophils Relative %: 58 %
Platelets: 19 10*3/uL — CL (ref 150–400)
RBC: 2.93 MIL/uL — ABNORMAL LOW (ref 3.87–5.11)
RDW: 18 % — ABNORMAL HIGH (ref 11.5–15.5)
WBC: 8.5 10*3/uL (ref 4.0–10.5)
nRBC: 0 % (ref 0.0–0.2)

## 2018-05-06 LAB — BASIC METABOLIC PANEL
Anion gap: 7 (ref 5–15)
BUN: 14 mg/dL (ref 6–20)
CO2: 32 mmol/L (ref 22–32)
Calcium: 7.8 mg/dL — ABNORMAL LOW (ref 8.9–10.3)
Chloride: 99 mmol/L (ref 98–111)
Creatinine, Ser: 0.54 mg/dL (ref 0.44–1.00)
GFR calc Af Amer: >60 mL/min (ref >60)
GFR calc non Af Amer: >60 mL/min (ref >60)
Glucose, Bld: 138 mg/dL — ABNORMAL HIGH (ref 70–99)
Potassium: 3.6 mmol/L (ref 3.5–5.1)
Sodium: 138 mmol/L (ref 135–145)

## 2018-05-06 LAB — GLUCOSE, CAPILLARY
Glucose-Capillary: 232 mg/dL — ABNORMAL HIGH (ref 70–99)
Glucose-Capillary: 109 mg/dL — ABNORMAL HIGH (ref 70–99)
Glucose-Capillary: 156 mg/dL — ABNORMAL HIGH (ref 70–99)

## 2018-05-06 LAB — HEPATIC FUNCTION PANEL
ALT: 14 U/L (ref 0–44)
AST: 23 U/L (ref 15–41)
Albumin: 1.7 g/dL — ABNORMAL LOW (ref 3.5–5.0)
Alkaline Phosphatase: 372 U/L — ABNORMAL HIGH (ref 38–126)
Bilirubin, Direct: 1 mg/dL — ABNORMAL HIGH (ref 0.0–0.2)
Indirect Bilirubin: 1 mg/dL — ABNORMAL HIGH (ref 0.3–0.9)
Total Bilirubin: 2 mg/dL — ABNORMAL HIGH (ref 0.3–1.2)
Total Protein: 5.4 g/dL — ABNORMAL LOW (ref 6.5–8.1)

## 2018-05-06 MED ORDER — CYCLOBENZAPRINE HCL 10 MG PO TABS: 10.0000 mg | ORAL_TABLET | Freq: Two times a day (BID) | ORAL | 0 refills | Status: DC | PRN

## 2018-05-06 MED ORDER — HEPARIN SOD (PORK) LOCK FLUSH 100 UNIT/ML IV SOLN
500.0000 [IU] | INTRAVENOUS | Status: AC | PRN
  Administered 2018-05-06: 500 [IU]

## 2018-05-06 MED ORDER — PRO-STAT SUGAR FREE PO LIQD: 30.0000 mL | Freq: Every day | ORAL | 0 refills | Status: AC

## 2018-05-06 MED ORDER — ENSURE ENLIVE PO LIQD: 237.0000 mL | Freq: Two times a day (BID) | ORAL | 0 refills | Status: AC

## 2018-05-06 MED ORDER — INSULIN DETEMIR 100 UNIT/ML FLEXPEN: 10.0000 [IU] | PEN_INJECTOR | Freq: Every day | SUBCUTANEOUS | 0 refills | Status: AC

## 2018-05-06 MED ORDER — ADULT MULTIVITAMIN W/MINERALS CH: 1.0000 | ORAL_TABLET | Freq: Every day | ORAL | Status: AC

## 2018-05-06 MED ORDER — HYDROCOD POLST-CPM POLST ER 10-8 MG/5ML PO SUER: 5.0000 mL | Freq: Two times a day (BID) | ORAL | 0 refills | Status: DC

## 2018-05-06 MED ORDER — BENZONATATE 200 MG PO CAPS: 200.0000 mg | ORAL_CAPSULE | Freq: Three times a day (TID) | ORAL | 0 refills | Status: DC

## 2018-05-06 NOTE — Progress Notes (Signed)
Physical Therapy Treatment Patient Details Name: Stacy Shelton MRN: [id number] DOB: May 08, 1960 Today's Date: 05/06/2018    History of Present Illness 58 yo female admitted with pleural effusion, fever, respiratory failure, sepsis. Hx of met breast cancer, DM, HF    PT Comments    Pt assisted to [facility] to due urgency and then ambulated in hallway.  Pt performed distance to tolerance however reports today is a rough day.  Pt has 3 dogs at home she is motivated and eager to return home.  Follow Up Recommendations  Home health PT;Supervision/Assistance - 24 hour     Equipment Recommendations  Rolling walker with 5" wheels    Recommendations for Other Services       Precautions / Restrictions Precautions Precautions: Fall Precaution Comments: monitor O2 Restrictions Weight Bearing Restrictions: No    Mobility  Bed Mobility Overal bed mobility: Needs Assistance Bed Mobility: Supine to Sit     Supine to sit: HOB elevated;Supervision     General bed mobility comments: pt OOB  Transfers Overall transfer level: Needs assistance Equipment used: Rolling walker (2 wheeled) Transfers: Sit to/from Stand Sit to Stand: Min assist Stand pivot transfers: Min assist       General transfer comment: Assist to rise, stabilize, control descent. VCs safety, technique, hand placement  Ambulation/Gait Ambulation/Gait assistance: Min assist Gait Distance (Feet): 80 Feet Assistive device: Rolling walker (2 wheeled) Gait Pattern/deviations: Step-through pattern;Decreased stride length     General Gait Details: slow pace, RW for steadying and endurance, pt reports she is typically independent at home and does not use oxygen, ambulated on 4L O2 [place], pt reports R LE buckling upon returning to room (no overt buckling observed)   Stairs             Wheelchair Mobility    Modified Rankin (Stroke Patients Only)       Balance Overall balance assessment: Needs assistance   Sitting  balance-Leahy Scale: Good     Standing balance support: Bilateral upper extremity supported Standing balance-Leahy Scale: Poor                              Cognition Arousal/Alertness: Awake/alert Behavior During Therapy: WFL for tasks assessed/performed Overall Cognitive Status: Within Functional Limits for tasks assessed                                        Exercises      General Comments        Pertinent Vitals/Pain Pain Assessment: No/denies pain Faces Pain Scale: Hurts a little bit Pain Location: ribs with coughing Pain Descriptors / Indicators: Discomfort Pain Intervention(s): Repositioned;Monitored during session    Home Living                      Prior Function            PT Goals (current goals can now be found in the care plan section) Progress towards PT goals: Progressing toward goals    Frequency           PT Plan Current plan remains appropriate    Co-evaluation              AM-PAC PT "6 Clicks" Mobility   Outcome Measure  Help needed turning from your back to your side while in a flat bed  without using bedrails?: A Little Help needed moving from lying on your back to sitting on the side of a flat bed without using bedrails?: A Little Help needed moving to and from a bed to a chair (including a wheelchair)?: A Little Help needed standing up from a chair using your arms (e.g., wheelchair or bedside chair)?: A Little Help needed to walk in hospital room?: A Little Help needed climbing 3-5 steps with a railing? : A Lot 6 Click Score: 17    End of Session Equipment Utilized During Treatment: Gait belt;Oxygen Activity Tolerance: Patient tolerated treatment well Patient left: in chair;with call bell/phone within reach Nurse Communication: Mobility status PT Visit Diagnosis: Muscle weakness (generalized) (M62.81);Unsteadiness on feet (R26.81);Difficulty in walking, not elsewhere classified (R26.2)      Time: 5427-0623 PT Time Calculation (min) (ACUTE ONLY): 24 min  Charges:  $Gait Training: 8-22 mins                    Carmelia Bake, PT, DPT Acute Rehabilitation Services Office: [phone] Pager: [phone]  Trena Platt 05/06/2018, 1:19 PM

## 2018-05-06 NOTE — Progress Notes (Addendum)
SATURATION QUALIFICATIONS: (This note is used to comply with regulatory documentation for home oxygen)  Patient Saturations on Room Air at Rest = 87%  Patient Saturations on Room Air while Ambulating = 87%  Patient Saturations on 3 Liters of oxygen while Ambulating = 97%  Please briefly explain why patient needs home oxygen:Patient's resting O2 level dropped to 87 with no high level o2 at all. Pt is 97% on 3L highflow [place].

## 2018-05-06 NOTE — TOC Transition Note (Signed)
Transition of Care [facility]) - CM/SW Discharge Note   Patient Details  Name: Stacy Shelton MRN: [id number] Date of Birth: 05-25-1960  Transition of [facility]) CM/SW Contact:  Dessa Phi, RN Phone Number: 05/06/2018, 1:23 PM   Clinical Narrative: d/c home today with home 02-[employer] will deliver rw,& portable oxygen concentrator(concentrator will not give out of oxygen) to rm prior d/c-[employer] will deliver additional oxygen supplies to home tomorrow. [facility] rep Santiago Glad will provide [place]. Per patient request-patient's sister on speaker phone to discuss d/c plans-informed [employer] dme delivery process(rep Brad on phone) to discuss oxygen delivery,& rw-for home delivery of oxygen concerns from sister [name] about Neena Rhymes many people,will they come inside or stay outside-all questions answered. [place] rep Santiago Glad informed CM of their process for [place] Capo as CM informed sister. Sister asked about d/c of patient to main entrance & her ability to manage her sister @ home. Reassured sister that patient feels comfortable about returning home & understanding all of the instructions provided. Informed sister that patient can safely d/c by vehicle transportation, & ambulance transp not recommended since patient can ambulate, & its not reasonably necessary, & patient prefers family to pick her up. Patient/sister agree to the d/c plans-CM answered all questions. No further CM needs.       Final next level of care: [place] Barriers to Discharge: No Barriers Identified   Patient Goals and CMS Choice Patient states their goals for this hospitalization and ongoing recovery are:: (get better)      Discharge Placement                       Discharge Plan and Services   Discharge Planning Services: CM Consult            DME Arranged: Oxygen DME Agency: [facility] HH Arranged: PT, OT [place] Agency: [place] ([facility])   Social Determinants of Health (SDOH) Interventions      Readmission Risk Interventions Readmission Risk Prevention Plan 04/25/2018 04/25/2018  Transportation Screening - Complete  Medication Review [employer] photographer) - Complete  PCP or Specialist appointment within 3-5 days of discharge (No Data) Complete  HRI or [place] - Complete  SW Recovery Care/Counseling Consult - Complete  [place] - Not Applicable  Some recent data might be hidden

## 2018-05-06 NOTE — Discharge Instructions (Signed)
Diabetes Mellitus and Nutrition, Adult  When you have diabetes (diabetes mellitus), it is very important to have healthy eating habits because your blood sugar (glucose) levels are greatly affected by what you eat and drink. Eating healthy foods in the appropriate amounts, at about the same times every day, can help you:  · Control your blood glucose.  · Lower your risk of heart disease.  · Improve your blood pressure.  · Reach or maintain a healthy weight.  Every person with diabetes is different, and each person has different needs for a meal plan. Your health care provider may recommend that you work with a diet and nutrition specialist (dietitian) to make a meal plan that is best for you. Your meal plan may vary depending on factors such as:  · The calories you need.  · The medicines you take.  · Your weight.  · Your blood glucose, blood pressure, and cholesterol levels.  · Your activity level.  · Other health conditions you have, such as heart or kidney disease.  How do carbohydrates affect me?  Carbohydrates, also called carbs, affect your blood glucose level more than any other type of food. Eating carbs naturally raises the amount of glucose in your blood. Carb counting is a method for keeping track of how many carbs you eat. Counting carbs is important to keep your blood glucose at a healthy level, especially if you use insulin or take certain oral diabetes medicines.  It is important to know how many carbs you can safely have in each meal. This is different for every person. Your dietitian can help you calculate how many carbs you should have at each meal and for each snack.  Foods that contain carbs include:  · Bread, cereal, rice, pasta, and crackers.  · Potatoes and corn.  · Peas, beans, and lentils.  · Milk and yogurt.  · Fruit and juice.  · Desserts, such as cakes, cookies, ice cream, and candy.  How does alcohol affect me?  Alcohol can cause a sudden decrease in blood glucose (hypoglycemia),  especially if you use insulin or take certain oral diabetes medicines. Hypoglycemia can be a life-threatening condition. Symptoms of hypoglycemia (sleepiness, dizziness, and confusion) are similar to symptoms of having too much alcohol.  If your health care provider says that alcohol is safe for you, follow these guidelines:  · Limit alcohol intake to no more than 1 drink per day for nonpregnant women and 2 drinks per day for men. One drink equals 12 oz of beer, 5 oz of wine, or 1½ oz of hard liquor.  · Do not drink on an empty stomach.  · Keep yourself hydrated with water, diet soda, or unsweetened iced tea.  · Keep in mind that regular soda, juice, and other mixers may contain a lot of sugar and must be counted as carbs.  What are tips for following this plan?    Reading food labels  · Start by checking the serving size on the "Nutrition Facts" label of packaged foods and drinks. The amount of calories, carbs, fats, and other nutrients listed on the label is based on one serving of the item. Many items contain more than one serving per package.  · Check the total grams (g) of carbs in one serving. You can calculate the number of servings of carbs in one serving by dividing the total carbs by 15. For example, if a food has 30 g of total carbs, it would be equal to 2   servings of carbs.  · Check the number of grams (g) of saturated and trans fats in one serving. Choose foods that have low or no amount of these fats.  · Check the number of milligrams (mg) of salt (sodium) in one serving. Most people should limit total sodium intake to less than 2,300 mg per day.  · Always check the nutrition information of foods labeled as "low-fat" or "nonfat". These foods may be higher in added sugar or refined carbs and should be avoided.  · Talk to your dietitian to identify your daily goals for nutrients listed on the label.  Shopping  · Avoid buying canned, premade, or processed foods. These foods tend to be high in fat, sodium,  and added sugar.  · Shop around the outside edge of the grocery store. This includes fresh fruits and vegetables, bulk grains, fresh meats, and fresh dairy.  Cooking  · Use low-heat cooking methods, such as baking, instead of high-heat cooking methods like deep frying.  · Cook using healthy oils, such as olive, canola, or sunflower oil.  · Avoid cooking with butter, cream, or high-fat meats.  Meal planning  · Eat meals and snacks regularly, preferably at the same times every day. Avoid going long periods of time without eating.  · Eat foods high in fiber, such as fresh fruits, vegetables, beans, and whole grains. Talk to your dietitian about how many servings of carbs you can eat at each meal.  · Eat 4-6 ounces (oz) of lean protein each day, such as lean meat, chicken, fish, eggs, or tofu. One oz of lean protein is equal to:  ? 1 oz of meat, chicken, or fish.  ? 1 egg.  ? ¼ cup of tofu.  · Eat some foods each day that contain healthy fats, such as avocado, nuts, seeds, and fish.  Lifestyle  · Check your blood glucose regularly.  · Exercise regularly as told by your health care provider. This may include:  ? 150 minutes of moderate-intensity or vigorous-intensity exercise each week. This could be brisk walking, biking, or water aerobics.  ? Stretching and doing strength exercises, such as yoga or weightlifting, at least 2 times a week.  · Take medicines as told by your health care provider.  · Do not use any products that contain nicotine or tobacco, such as cigarettes and e-cigarettes. If you need help quitting, ask your health care provider.  · Work with a counselor or diabetes educator to identify strategies to manage stress and any emotional and social challenges.  Questions to ask a health care provider  · Do I need to meet with a diabetes educator?  · Do I need to meet with a dietitian?  · What number can I call if I have questions?  · When are the best times to check my blood glucose?  Where to find more  information:  · American Diabetes Association: diabetes.org  · Academy of Nutrition and Dietetics: www.eatright.org  · National Institute of Diabetes and Digestive and Kidney Diseases (NIH): www.niddk.nih.gov  Summary  · A healthy meal plan will help you control your blood glucose and maintain a healthy lifestyle.  · Working with a diet and nutrition specialist (dietitian) can help you make a meal plan that is best for you.  · Keep in mind that carbohydrates (carbs) and alcohol have immediate effects on your blood glucose levels. It is important to count carbs and to use alcohol carefully.  This information is not intended to   replace advice given to you by your health care provider. Make sure you discuss any questions you have with your health care provider.  Document Released: 10/12/2004 Document Revised: 08/15/2016 Document Reviewed: 02/20/2016  [employer] Interactive Patient Education © 2019 [employer]

## 2018-05-06 NOTE — Discharge Summary (Signed)
Physician Discharge Summary  Stacy Shelton QPY:[id number] DOB: Nov 05, 1960 DOA: 04/24/2018  PCP: Robyne Peers, MD  Admit date: 04/24/2018 Discharge date: 05/06/2018  Admitted From: Home Discharge disposition: Home with home health, PT, RN   Code Status: Full Code   Recommendations for Outpatient Follow-Up:   1. Follow-up with oncology as an outpatient. 2. Monitor blood sugar at home.  Discharge Diagnosis:   Active Problems:   Uncontrolled diabetes mellitus with hyperglycemia (HCC)   Metastatic breast cancer (HCC)   Chronic diastolic CHF (congestive heart failure) (HCC)   Anemia associated with chemotherapy   Chemotherapy-induced thrombocytopenia   Hypoxia   Increased oxygen demand   Tachypnea  History of Present Illness / Brief narrative:  Patient is a 58 year old female with PMH relevant for metastatic breast cancer (brain, bone, liver; last Chemo 03-26-18), type 2 diabetes, chronic diastolic heart failure, hypertension who was recently hospitalized from 04/07/2018 to 04/16/2018 for septic shock thought to be secondary to pneumonia. She was not felt to be at risk for COVID as she had no known exposures and only traveled back and forth to chemotherapy.   1 week post discharge, on 3/26, She returned to the ED with recurrent fevers and cough. In the ED, WBC count was normal.  Chest x-ray showed significant partial clearing of consolidation from the right upper lobe, no new opacity.  CT scan of neck showed retropharyngeal fluid collection compatible with effusion or possibly abscess with extension of inflammation around right carotid.  She was admitted and started on broad-spectrum antibiotics. Her respiratory status worsened. COVID-19 was suspected.  Test negative x2. Her hospital course was also complicated by acute hypoxic respiratory failure with left pleural effusion.  Hospital Course:   Retropharyngeal fluid collection/tender cervical lymphadenopathy Patient was seen by ENT.  The fluid collection apparently clinically improved with broad-spectrum antibiotics.  Off antibiotics since 4/4. Neck pain and swelling has also significantly improved. No ENT intervention is recommended. ENT has signed off  Acute respiratory failure with hypoxia/ left pleural effusion/ history of COPD - History of recently treated pneumonia - Chest x-ray on admission actually showed improvement.On 3/28 patient became acutely hypoxic and developed a fever.Chest x-ray showed layering pleural effusion. 120 cc of fluid was removed with thoracentesis.  Fluid was thought to be exudative in nature however, cultures negative. Cytology negative as well.  ID consult appreciated.  Finished course of IV vancomycin and Zosyn on 4/4. - COVID test negative x 2.Discontinued airborne plus contact precautions. Influenza and respiratory viral pathogen panel was negative.  - Repeat CXR on 4/6 showed improved expansion of both lungs and improved aeration of the left lower lobe. Bilateral pneumonia remains, right greater than left. Very small bilateral pleural effusions. - Off antibiotics for 72 hours, no fever for 36 hours.  WBC count normal.  Patient clinically feels much better.  She feels ready to be discharged to home. - Currently requiring 2-3 L of oxygen by nasal cannula for ambulation.  - Home oxygen set up.  Metastatic breast cancer (brain, bone, liver) - last Chemo 03-26-18. - Follow-up with oncology as an outpatient.  Pancytopenia Patient has chronic thrombocytopenia. Also has chemotherapy-induced anemia. Hemoglobin noted to be 6.9 on 4/5. No overt bleeding was noted. She was transfused 1 unit of blood.  Hemoglobin improved to 8.3 today.  Platelet counts running low as well, nadir of 14,000. noted to be 19,000. She did not receive platelet transfusion.  Supraventricular tachycardia Patient had frequent episodes of SVT while she was in  the stepdown unit. The rhythm was resistant to intravenous  beta-blocker. Patient had to be given adenosine with which she converted to sinus tachycardia. She remains on sinus rhythm on oral metoprolol. Telemetry does not show any episodes of SVT.Echocardiogram from February showed normal systolic function. TSH was normal when checked in March.  Diabetes mellitus type 2 - Recently diagnosed.  Prior to this admission, patient was in 20 units of Lantus at home, NovoLog pre-meal 3 times daily as well as metformin. Patient had a few episodes of hypoglycemia likely due to infection and poor oral intake. Now blood sugar level has been normal on low dose of Lantus at 10 units daily.  Metformin remains on hold. Continue the same care at home.   History of chronic diastolic CHF Patient appears to be euvolemic.Continue to monitor for now.  Essential hypertension Blood pressure is reasonably well controlled.  Medical Consultants:    Infectious disease  Subjective:  Patient was seen and examined this morning.  Pleasant middle-aged Caucasian female.  Lying down in bed.  States he has more energy than yesterday.  Feels ready to go home.  She walked on the hallway today with a walker.  Qualifies for home oxygen.  I had a long conversation with patient's sister Ms. Trudie Reed on the phone.  Answered all questions to satisfaction.  Discharge Exam:   Vitals:   05/05/18 1334 05/05/18 2012 05/06/18 0402 05/06/18 1310  BP: (!) 127/55 138/63 (!) 119/53 (!) 112/54  Pulse: 78 87 74 81  Resp: 18 20 18 19   Temp: (!) 97.4 F (36.3 C) 97.9 F (36.6 C) 97.8 F (36.6 C) 98.2 F (36.8 C)  TempSrc: Oral Oral Oral Oral  SpO2: 99% 96% 98% 95%  Weight:      Height:        Body mass index is 28.79 kg/m.  General exam: Appears calm and comfortable.  Improving tiredness. Skin: No rashes, lesions or ulcers. HEENT: Normal Lungs: Diminished air entry in both bases.  Clear to auscultation bilaterally otherwise CVS: Regular rate and rhythm, no murmur GI/Abd soft,  nondistended, nontender, bowel sound present CNS: Alert, awake, oriented x3 Psychiatry: Mood & affect appropriate.  Extremities: No pedal edema, no calf tenderness  Discharge Instructions:  Wound care: None Discharge Instructions    Call MD for:  extreme fatigue   Complete by:  As directed    Call MD for:  persistant dizziness or light-headedness   Complete by:  As directed    Call MD for:  persistant nausea and vomiting   Complete by:  As directed    Call MD for:  temperature >100.4   Complete by:  As directed    Diet general   Complete by:  As directed    Once oral intake improves, need to restrict sodium months sugar intake for comanagement of hypertension and diabetes mellitus.   Increase activity slowly   Complete by:  As directed      Follow-up Information    [facility] Follow up.   Why:  rolling walker Contact information: [place] [phone]        [facility] Follow up.   Specialty:  Home Health Services Why:  [place]  [facility]ational therapy          Allergies as of 05/06/2018      Reactions   Nsaids Anaphylaxis      Medication  List    STOP taking these medications   metFORMIN 500 MG tablet Commonly known as:  GLUCOPHAGE     TAKE these medications   Accu-Chek Aviva Plus test strip Generic drug:  glucose blood   Accu-Chek Softclix Lancets lancets   albuterol 108 (90 Base) MCG/ACT inhaler Commonly known as:  ProAir HFA Inhale 2 puffs into the lungs every 6 (six) hours as needed for wheezing or shortness of breath.   benzonatate 200 MG capsule Commonly known as:  TESSALON Take 1 capsule (200 mg total) by mouth 3 (three) times daily.   blood glucose meter kit and supplies Kit Dispense based on patient and insurance preference. Use up to four times daily as directed. (FOR ICD-9 250.00, 250.01).     chlorpheniramine-HYDROcodone 10-8 MG/5ML Suer Commonly known as:  TUSSIONEX Take 5 mLs by mouth every 12 (twelve) hours.   cyclobenzaprine 10 MG tablet Commonly known as:  FLEXERIL Take 1 tablet (10 mg total) by mouth 2 (two) times daily as needed for muscle spasms.   dexamethasone 4 MG tablet Commonly known as:  DECADRON Take 1 tablet in the morning before chemo, none on the day of chemo and daily in the morning after chemo for 2 days, with food   esomeprazole 40 MG capsule Commonly known as:  NEXIUM Take 1 capsule (40 mg total) by mouth 2 (two) times daily before a meal.   feeding supplement (ENSURE ENLIVE) Liqd Take 237 mLs by mouth 2 (two) times daily between meals for 7 days.   feeding supplement (PRO-STAT SUGAR FREE 64) Liqd Take 30 mLs by mouth daily for 7 days. Start taking on:  May 07, 2018   fluticasone 44 MCG/ACT inhaler Commonly known as:  Flovent HFA Inhale 2 puffs into the lungs 2 (two) times daily.   insulin aspart 100 UNIT/ML FlexPen Commonly known as:  NOVOLOG Inject 8 Units into the skin 3 (three) times daily with meals.   Insulin Detemir 100 UNIT/ML Pen Commonly known as:  LEVEMIR Inject 10 Units into the skin daily. What changed:  how much to take   Insulin Pen Needle 31G X 5 MM Misc 1 Device by Does not apply route QID. For use with insulin pens   lidocaine-prilocaine cream Commonly known as:  EMLA Apply to affected area once What changed:    how much to take  how to take this  when to take this  reasons to take this   lip balm ointment Apply topically as needed for lip care.   loperamide 2 MG tablet Commonly known as:  IMODIUM A-D Take 1 tablet (2 mg total) by mouth 4 (four) times daily as needed for diarrhea or loose stools.   LORazepam 0.5 MG tablet Commonly known as:  Ativan Take 1 tab po 30 minutes prior to radiation or MRI   metoprolol succinate 50 MG 24 hr tablet Commonly known as:  TOPROL-XL Take 50 mg by mouth every  evening.   montelukast 10 MG tablet Commonly known as:  SINGULAIR TAKE 1 TABLET BY MOUTH EVERYDAY AT BEDTIME What changed:  See the new instructions.   multivitamin with minerals Tabs tablet Take 1 tablet by mouth daily. Start taking on:  May 07, 2018            Durable Medical Equipment  (From admission, onward)         Start     Ordered   05/06/18 1302  For home use only DME oxygen  Once  Question Answer Comment  Mode or (Route) Nasal cannula   Liters per Minute 3   Frequency Continuous (stationary and portable oxygen unit needed)   Oxygen conserving device No   Oxygen delivery system Gas      05/06/18 1302   05/06/18 1045  For home use only DME Walker rolling  Once    Question:  Patient needs a walker to treat with the following condition  Answer:  Unsteady gait   05/06/18 1046          Time coordinating discharge: 45 minutes  The results of significant diagnostics from this hospitalization (including imaging, microbiology, ancillary and laboratory) are listed below for reference.    Procedures and Diagnostic Studies:   Ct Soft Tissue Neck W Contrast  Result Date: 04/25/2018 CLINICAL DATA:  Fever. Recent admission for pneumonia and sepsis. Metastatic breast cancer. EXAM: CT NECK WITH CONTRAST TECHNIQUE: Multidetector CT imaging of the neck was performed using the standard protocol following the bolus administration of intravenous contrast. CONTRAST:  61m OMNIPAQUE IOHEXOL 300 MG/ML  SOLN COMPARISON:  None. FINDINGS: Pharynx and larynx: Moderately large retropharyngeal fluid collection compatible with effusion. This measures up to 10 mm in thickness. This appears to extend into the right neck where there is soft tissue edema around the right carotid artery extending into the right neck soft tissues. No pharyngeal or tonsillar abscess. Epiglottis normal Salivary glands: No inflammation, mass, or stone. There is edema surrounding the right submandibular gland which  appears extrinsic to the gland. Thyroid: Negative Lymph nodes: Right lateral lymph node measuring 7.6 mm likely reactive,, with surrounding edema. Otherwise no enlarged or prominent lymph nodes. Vascular: Right jugular Port-A-Cath in good position. Jugular vein patent bilaterally. Normal vascular enhancement. Prominent soft tissue is seen surrounding the right carotid artery. This is likely inflammatory given the retropharyngeal effusion as well as recent sepsis. Limited intracranial: No acute abnormality. Known right occipital metastatic deposit not imaged on the current study. Visualized orbits: Negative Mastoids and visualized paranasal sinuses: Mucoperiosteal thickening in the paranasal sinuses with prior sinus surgery. Orbits not imaged Skeleton: No acute skeletal abnormality. Upper chest: Right upper lobe nodular densities. Interval improvement in right upper lobe airspace disease since 04/08/2018. Residual nodule densities may be metastatic disease or pneumonia. Small bilateral pleural effusions. Other: None IMPRESSION: Retropharyngeal fluid collection compatible with effusion or possibly abscess. Soft tissue thickening extends into the right neck and surrounds the right carotid bifurcation and right internal carotid artery. There is also streaky density in the right lateral neck soft tissues with a focal 7.6 Mm lymph node in the right lateral neck. Stranding surrounds the right submandibular gland. Findings are most compatible with infection. Favor pharyngitis with retropharyngeal effusion/abscess. Soft tissue thickening around the right carotid most compatible with infection rather than tumor. Right jugular vein is patent. Multiple nodular densities in the right upper lobe may represent residual pneumonia or metastatic disease. Interval improvement in right upper lobe infiltrate compared with CT of 04/08/2018 These results were called by telephone at the time of interpretation on 04/25/2018 at 12:36 pm to  Dr. SThomes Dinning, who verbally acknowledged these results. Electronically Signed   By: CFranchot GalloM.D.   On: 04/25/2018 12:39   Dg Chest Port 1 View  Result Date: 04/24/2018 CLINICAL DATA:  Fever.  Metastatic breast carcinoma EXAM: PORTABLE CHEST 1 VIEW COMPARISON:  April 12, 2018 FINDINGS: There has been significant clearing of consolidation from the right upper lobe. A small amount of patchy opacity with  volume loss remains in this area. Atelectasis remains in the left base. No new opacity evident. Heart is upper normal in size with pulmonary vascularity normal. No adenopathy. There is aortic atherosclerosis. Port-A-Cath tip is in the superior vena cava. No pneumothorax. No bone lesions are appreciable. IMPRESSION: Significant partial clearing of consolidation from the right upper lobe. Patchy infiltrate and volume loss remains in this area. Stable left base atelectasis. No new opacity. Stable cardiac silhouette. Stable Port-A-Cath positioning. Aortic Atherosclerosis (ICD10-I70.0). Electronically Signed   By: Lowella Grip III M.D.   On: 04/24/2018 09:32     Labs:   Basic Metabolic Panel: Recent Labs  Lab 04/30/18 0308 05/01/18 0436 05/02/18 0100 05/03/18 0853 05/04/18 0344 05/05/18 0318 05/06/18 0430  NA 141 139 137  --   --  137 138  K 2.9* 3.0* 3.9  --   --  4.0 3.6  CL 102 98 96*  --   --  97* 99  CO2 31 31 32  --   --  30 32  GLUCOSE 156* 185* 297*  --   --  176* 138*  BUN 16 19 24*  --   --  13 14  CREATININE 0.78 0.70 0.69 0.71 0.54 0.62 0.54  CALCIUM 8.1* 8.0* 8.1*  --   --  7.9* 7.8*  MG 1.8  --  1.8  --   --  1.9  --    GFR Estimated Creatinine Clearance: 70.9 mL/min (by C-G formula based on SCr of 0.54 mg/dL). Liver Function Tests: Recent Labs  Lab 05/02/18 0100 05/06/18 0430  AST 25 23  ALT 13 14  ALKPHOS 196* 372*  BILITOT 3.4* 2.0*  PROT 5.9* 5.4*  ALBUMIN 1.8* 1.7*   No results for input(s): LIPASE, AMYLASE in the last 168 hours. No results for  input(s): AMMONIA in the last 168 hours. Coagulation profile No results for input(s): INR, PROTIME in the last 168 hours.  CBC: Recent Labs  Lab 04/30/18 0308 05/01/18 0436 05/02/18 0100 05/04/18 0344 05/04/18 1658 05/05/18 0318 05/06/18 0430  WBC 7.4 7.1 14.3* 12.9*  --  13.0* 8.5  NEUTROABS 4.9  --  9.6*  --   --  7.7 5.3  HGB 8.1* 7.4* 8.5* 6.9* 8.3* 9.0* 8.3*  HCT 27.3* 24.2* 27.1* 22.0* 26.1* 28.8* 26.8*  MCV 90.7 89.3 89.1 90.9  --  90.0 91.5  PLT 33* 21* 25* 14*  --  16* 19*   Cardiac Enzymes: No results for input(s): CKTOTAL, CKMB, CKMBINDEX, TROPONINI in the last 168 hours. BNP: Invalid input(s): POCBNP CBG: Recent Labs  Lab 05/05/18 1250 05/05/18 1726 05/05/18 2157 05/06/18 0820 05/06/18 1139  GLUCAP 181* 146* 161* 109* 156*   D-Dimer No results for input(s): DDIMER in the last 72 hours. Hgb A1c No results for input(s): HGBA1C in the last 72 hours. Lipid Profile No results for input(s): CHOL, HDL, LDLCALC, TRIG, CHOLHDL, LDLDIRECT in the last 72 hours. Thyroid function studies No results for input(s): TSH, T4TOTAL, T3FREE, THYROIDAB in the last 72 hours.  Invalid input(s): FREET3 Anemia work up No results for input(s): VITAMINB12, FOLATE, FERRITIN, TIBC, IRON, RETICCTPCT in the last 72 hours. Microbiology Recent Results (from the past 240 hour(s))  Culture, body fluid-bottle     Status: None   Collection Time: 04/27/18  9:50 AM  Result Value Ref Range Status   Specimen Description PLEURAL LEFT  Final   Special Requests NONE  Final   Culture   Final    NO GROWTH 5  DAYS Performed at [facility], [place] [address], [place], [place] [address]    Report Status 05/02/2018 FINAL  Final  Gram stain     Status: None   Collection Time: 04/27/18  9:50 AM  Result Value Ref Range Status   Specimen Description PLEURAL LEFT  Final   Special Requests NONE  Final   Gram Stain   Final    MODERATE WBC PRESENT,BOTH PMN AND MONONUCLEAR NO ORGANISMS  SEEN Performed at [facility], [place] [address], [place], [place] [address]    Report Status 04/27/2018 FINAL  Final  Novel Coronavirus, NAA (hospital order; send-out to ref lab)     Status: None   Collection Time: 04/28/18  9:26 AM  Result Value Ref Range Status   SARS-CoV-2, NAA NOT DETECTED NOT DETECTED Final    Comment: Negative (Not Detected) results do not exclude infection caused by SARS CoV 2 and should not be used as the sole basis for treatment or other patient management decisions. Optimum specimen types and timing for peak viral levels during infections caused  by SARS CoV 2 have not been determined. Collection of multiple specimens (types and time points) from the same patient may be necessary to detect the virus. Improper specimen collection and handling, sequence variability underlying assay primers and or probes, or the presence of organisms in  quantities less than the limit of detection of the assay may lead to false negative results. Positive and negative predictive values of testing are highly dependent on prevalence. False negative results are more likely when prevalence of disease is high. (NOTE) The expected result is Negative (Not Detected). The SARS CoV 2 test is intended for the presumptive qualitative  detection of nucleic acid from SARS CoV 2 in upper and lower  respir atory specimens. Testing methodology is real time RT PCR. Test results must be correlated with clinical presentation and  evaluated in the context of other laboratory and epidemiologic data.  Test performance can be affected because the epidemiology and  clinical spectrum of infection caused by SARS CoV 2 is not fully  known. For example, the optimum types of specimens to collect and  when during the course of infection these specimens are most likely  to contain detectable viral RNA may not be known. This test has not been Food and Drug Administration (FDA) cleared or  approved and has been  authorized by FDA under an Emergency Use  Authorization (EUA). The test is only authorized for the duration of  the declaration that circumstances exist justifying the authorization  of emergency use of in vitro diagnostic tests for detection and or  diagnosis of SARS CoV 2 under Section 564(b)(1) of the Act, 21 U.S.C.  section [phone] 3(b)(1), unless the authorization is terminated or   revoked sooner. [facility] is certified under the  Clinical Laboratory Improvement Amendments of 1988 (CLIA), 42 U.S.C.  section [phone], to perform high complexity tests. Performed at [facility] [id number] [address], [place], [place] [address] Laboratory Director: Loleta Books, MD Performed at [facility], [place] [address] [place], [place] [address]    Coronavirus Source NASOPHARYNGEAL  Final    Comment: Performed at [facility], [place] [address] [place], [place] [address]  Novel Coronavirus, NAA (hospital order; send-out to ref lab)     Status: None   Collection Time: 05/01/18 12:46 PM  Result Value Ref Range Status  SARS-CoV-2, NAA NOT DETECTED NOT DETECTED Final    Comment: Negative (Not Detected) results do not exclude infection caused by SARS CoV 2 and should not be used as the sole basis for treatment or other patient management decisions. Optimum specimen types and timing for peak viral levels during infections caused  by SARS CoV 2 have not been determined. Collection of multiple specimens (types and time points) from the same patient may be necessary to detect the virus. Improper specimen collection and handling, sequence variability underlying assay primers and or probes, or the presence of organisms in  quantities less than the limit of detection of the assay may lead to false negative results. Positive and negative predictive values of testing are highly dependent on prevalence. False negative results  are more likely when prevalence of disease is high. (NOTE) The expected result is Negative (Not Detected). The SARS CoV 2 test is intended for the presumptive qualitative  detection of nucleic acid from SARS CoV 2 in upper and lower  respir atory specimens. Testing methodology is real time RT PCR. Test results must be correlated with clinical presentation and  evaluated in the context of other laboratory and epidemiologic data.  Test performance can be affected because the epidemiology and  clinical spectrum of infection caused by SARS CoV 2 is not fully  known. For example, the optimum types of specimens to collect and  when during the course of infection these specimens are most likely  to contain detectable viral RNA may not be known. This test has not been Food and Drug Administration (FDA) cleared or  approved and has been authorized by FDA under an Emergency Use  Authorization (EUA). The test is only authorized for the duration of  the declaration that circumstances exist justifying the authorization  of emergency use of in vitro diagnostic tests for detection and or  diagnosis of SARS CoV 2 under Section 564(b)(1) of the Act, 21 U.S.C.  section [phone] 3(b)(1), unless the authorization is terminated or   revoked sooner. [facility] is certified under the  Clinical Laboratory Improvement Amendments of 1988 (CLIA), 42 U.S.C.  section [phone], to perform high complexity tests. Performed at [facility] [id number], [place], [place] [address] Laboratory Director: Loleta Books, MD Performed at [facility], [place] [address] [place], [place] [address]    Coronavirus Source NASOPHARYNGEAL  Final    Comment: Performed at [facility], [place] [address] [place], [place] [address]   Signed: Marlowe Aschoff Yukie Bergeron  [facility] 05/06/2018, 1:56 PM

## 2018-05-06 NOTE — TOC Transition Note (Signed)
Transition of Care [facility]) - CM/SW Discharge Note   Patient Details  Name: Stacy Shelton MRN: [id number] Date of Birth: 1960/09/26  Transition of Care [facility]) CM/SW Contact:  Dessa Phi, RN Phone Number: 05/06/2018, 11:00 AM   Clinical Narrative:  [place] for HHC-rep Santiago Glad aware of [place] orders. [facility] dme rep Brad aware of rw order & following if home 02 needed-await 02 sats documented.YM:EBRAXEN CHF.     Final next level of care: [place] Barriers to Discharge: No Barriers Identified   Patient Goals and CMS Choice Patient states their goals for this hospitalization and ongoing recovery are:: (get better)      Discharge Placement                       Discharge Plan and Services   Discharge Planning Services: CM Consult            DME Arranged: Walker rolling DME Agency: [facility] HH Arranged: PT, OT HH Agency: [place] ([facility])   Social Determinants of Health (SDOH) Interventions     Readmission Risk Interventions Readmission Risk Prevention Plan 04/25/2018 04/25/2018  Transportation Screening - Complete  Medication Review Press photographer) - Complete  PCP or Specialist appointment within 3-5 days of discharge (No Data) Complete  HRI or [place] - Complete  SW Recovery Care/Counseling Consult - Complete  [place] - Not Applicable  Some recent data might be hidden

## 2018-05-06 NOTE — Progress Notes (Signed)
Patient's port was deacessed and now she is ready for d/c. She has been given a walker and home oxygen. Discharge paperwork has been given.

## 2018-05-06 NOTE — Progress Notes (Signed)
Occupational Therapy Treatment Patient Details Name: Stacy Shelton MRN: [id number] DOB: April 30, 1960 Today's Date: 05/06/2018    History of present illness 58 yo female admitted with pleural effusion, fever, respiratory failure, sepsis. Hx of met breast cancer, DM, HF   OT comments  Pt fatigued VERY quickly.  Discussed care at home.  Pts sister here from out of town at this time. Pt states they have a meal train but did does care for her mother and unsure of how long sister will stay.   Follow Up Recommendations  Home health OT;Supervision/Assistance - 24 hour    Equipment Recommendations  None recommended by OT    Recommendations for Other Services      Precautions / Restrictions Precautions Precautions: Fall Precaution Comments: monitor O2 Restrictions Weight Bearing Restrictions: No       Mobility Bed Mobility               General bed mobility comments: pt OOB  Transfers Overall transfer level: Needs assistance Equipment used: Rolling walker (2 wheeled) Transfers: Sit to/from [facility] Sit to Stand: Min assist Stand pivot transfers: Min assist       General transfer comment: Assist to rise, stabilize, control descent. VCs safety, technique, hand placement. Pt did have one situation  in which  she sat with no notice and plopped down in the chair.  Educated pt on importance of reaching back     Balance Overall balance assessment: Needs assistance   Sitting balance-Leahy Scale: Good     Standing balance support: Bilateral upper extremity supported Standing balance-Leahy Scale: Poor                             ADL either performed or assessed with clinical judgement   ADL Overall ADL's : Needs assistance/impaired Eating/Feeding: Set up;Sitting   Grooming: Set up;Sitting                                 General ADL Comments: Pt participated in sit to stand with OT focusing on safety and hand placement.  Sats  decreased from 4L to 2L and pt maintained greater than 94.  OT did remove oxygen and pt performed standing activity and sats decreased to 88.  Oxygen reapplied and pts sats increased to 94.  RN notified.       Vision Patient Visual Report: No change from baseline     Perception     Praxis      Cognition Arousal/Alertness: Awake/alert Behavior During Therapy: WFL for tasks assessed/performed Overall Cognitive Status: Within Functional Limits for tasks assessed                                                     Pertinent Vitals/ Pain       Pain Assessment: Faces Faces Pain Scale: Hurts a little bit Pain Location: ribs with coughing Pain Descriptors / Indicators: Discomfort Pain Intervention(s): Limited activity within patient's tolerance;Repositioned         Frequency  Min 2X/week        Progress Toward Goals  OT Goals(current goals can now be found in the care plan section)  Progress towards OT goals: Progressing toward goals     Plan  Discharge plan remains appropriate       AM-PAC OT "6 Clicks" Daily Activity     Outcome Measure   Help from another person eating meals?: A Little Help from another person taking care of personal grooming?: A Little Help from another person toileting, which includes using toliet, bedpan, or urinal?: A Little Help from another person bathing (including washing, rinsing, drying)?: A Little Help from another person to put on and taking off regular upper body clothing?: A Little Help from another person to put on and taking off regular lower body clothing?: A Lot 6 Click Score: 17    End of Session Equipment Utilized During Treatment: Rolling walker  OT Visit Diagnosis: Unsteadiness on feet (R26.81);Muscle weakness (generalized) (M62.81)   Activity Tolerance Patient tolerated treatment well   Patient Left in chair   Nurse Communication Mobility status        Time: 3729-0211 OT Time Calculation  (min): 66 min  Charges: OT General Charges $OT Visit: 1 Visit OT Treatments $Self Care/Home Management : 53-67 mins  Kari Baars, OT Acute Rehabilitation Services [phone] Office- [phone]      Ellington Cornia, Edwena Felty D 05/06/2018, 12:56 PM

## 2018-05-06 NOTE — Progress Notes (Signed)
[facility] for Infectious Disease   Reason for visit: Follow up on fever, pleural effusion/PNA  Interval History: pt remains on O2 via [employer] but continues to ambulate using her walker and is anxious to go home later today; no further fever since Sunday morning despite ABX remaining on hold; dysphagia now resolved; 45+ minutes spent on phone call conference with pt's sister and mother re: infectious issues and answering questions with d/c planners  CXR independently reviewed   Physical Exam: Vitals:   05/06/18 0402 05/06/18 1310  BP: (!) 119/53 (!) 112/54  Pulse: 74 81  Resp: 18 19  Temp: 97.8 F (36.6 C) 98.2 F (36.8 C)  SpO2: 98% 95%  Lines: + chest wall portocath w/o TTP or erythema Constitutional: patient appears chronically ill with alopecia in mild respiratory distress Eyes: anicteric, EOMI, PERRL HENT: no thrush, MMM Respiratory: rare abdominal retractions with occasional nasal flaring, CTA B Cardiovascular: RRR, no murmurs audible GI: soft, nt, nd, +BS Extrems: trace LE edema, 2+ pulses throughout Skin: no rashes  Review of Systems: Constitutional: negative All other systems reviewed and negative except as noted above  Lab Results  Component Value Date   WBC 8.5 05/06/2018   HGB 8.3 (L) 05/06/2018   HCT 26.8 (L) 05/06/2018   MCV 91.5 05/06/2018   PLT 19 (LL) 05/06/2018    Lab Results  Component Value Date   CREATININE 0.54 05/06/2018   BUN 14 05/06/2018   NA 138 05/06/2018   K 3.6 05/06/2018   CL 99 05/06/2018   CO2 32 05/06/2018    Lab Results  Component Value Date   ALT 14 05/06/2018   AST 23 05/06/2018   ALKPHOS 372 (H) 05/06/2018     Microbiology: Recent Results (from the past 240 hour(s))  Culture, body fluid-bottle     Status: None   Collection Time: 04/27/18  9:50 AM  Result Value Ref Range Status   Specimen Description PLEURAL LEFT  Final   Special Requests NONE  Final   Culture   Final    NO GROWTH 5 DAYS Performed at [facility], [address] [place],  [address]    Report Status 05/02/2018 FINAL  Final  Gram stain     Status: None   Collection Time: 04/27/18  9:50 AM  Result Value Ref Range Status   Specimen Description PLEURAL LEFT  Final   Special Requests NONE  Final   Gram Stain   Final    MODERATE WBC PRESENT,BOTH PMN AND MONONUCLEAR NO ORGANISMS SEEN Performed at [facility], [place] [address] [place],  [address]    Report Status 04/27/2018 FINAL  Final  Novel Coronavirus, NAA (hospital order; send-out to ref lab)     Status: None   Collection Time: 04/28/18  9:26 AM  Result Value Ref Range Status   SARS-CoV-2, NAA NOT DETECTED NOT DETECTED Final    Comment: Negative (Not Detected) results do not exclude infection caused by SARS CoV 2 and should not be used as the sole basis for treatment or other patient management decisions. Optimum specimen types and timing for peak viral levels during infections caused  by SARS CoV 2 have not been determined. Collection of multiple specimens (types and time points) from the same patient may be necessary to detect the virus. Improper specimen collection and handling, sequence variability underlying assay primers and or probes, or the presence of organisms in  quantities less than the limit of detection of  the assay may lead to false negative results. Positive and negative predictive values of testing are highly dependent on prevalence. False negative results are more likely when prevalence of disease is high. (NOTE) The expected result is Negative (Not Detected). The SARS CoV 2 test is intended for the presumptive qualitative  detection of nucleic acid from SARS CoV 2 in upper and lower  respir atory specimens. Testing methodology is real time RT PCR. Test results must be correlated with clinical presentation and  evaluated in the context of other laboratory and epidemiologic data.  Test performance can be affected because the  epidemiology and  clinical spectrum of infection caused by SARS CoV 2 is not fully  known. For example, the optimum types of specimens to collect and  when during the course of infection these specimens are most likely  to contain detectable viral RNA may not be known. This test has not been Food and Drug Administration (FDA) cleared or  approved and has been authorized by FDA under an Emergency Use  Authorization (EUA). The test is only authorized for the duration of  the declaration that circumstances exist justifying the authorization  of emergency use of in vitro diagnostic tests for detection and or  diagnosis of SARS CoV 2 under Section 564(b)(1) of the Act, 21 U.S.C.  section [phone] 3(b)(1), unless the authorization is terminated or   revoked sooner. [facility] is certified under the  Clinical Laboratory Improvement Amendments of 1988 (CLIA), 42 U.S.C.  section [phone], to perform high complexity tests. Performed at [place] [id number] [address], [place], [place] [address] Laboratory Director: Loleta Books, MD Performed at [facility], [place] [address], [place], [place] [address]    Coronavirus Source NASOPHARYNGEAL  Final    Comment: Performed at [facility], [place] [address], [place], [place] [address]  Novel Coronavirus, NAA (hospital order; send-out to ref lab)     Status: None   Collection Time: 05/01/18 12:46 PM  Result Value Ref Range Status   SARS-CoV-2, NAA NOT DETECTED NOT DETECTED Final    Comment: Negative (Not Detected) results do not exclude infection caused by SARS CoV 2 and should not be used as the sole basis for treatment or other patient management decisions. Optimum specimen types and timing for peak viral levels during infections caused  by SARS CoV 2 have not been determined. Collection of multiple specimens (types and time points) from the same patient may be  necessary to detect the virus. Improper specimen collection and handling, sequence variability underlying assay primers and or probes, or the presence of organisms in  quantities less than the limit of detection of the assay may lead to false negative results. Positive and negative predictive values of testing are highly dependent on prevalence. False negative results are more likely when prevalence of disease is high. (NOTE) The expected result is Negative (Not Detected). The SARS CoV 2 test is intended for the presumptive qualitative  detection of nucleic acid from SARS CoV 2 in upper and lower  respir atory specimens. Testing methodology is real time RT PCR. Test results must be correlated with clinical presentation and  evaluated in the context of other laboratory and epidemiologic data.  Test performance can be affected because the epidemiology and  clinical spectrum of infection caused by SARS CoV 2 is not fully  known. For example, the optimum types of specimens to collect and  when during  the course of infection these specimens are most likely  to contain detectable viral RNA may not be known. This test has not been Food and Drug Administration (FDA) cleared or  approved and has been authorized by FDA under an Emergency Use  Authorization (EUA). The test is only authorized for the duration of  the declaration that circumstances exist justifying the authorization  of emergency use of in vitro diagnostic tests for detection and or  diagnosis of SARS CoV 2 under Section 564(b)(1) of the Act, 21 U.S.C.  section [phone] 3(b)(1), unless the authorization is terminated or   revoked sooner. [facility] is certified under the  Clinical Laboratory Improvement Amendments of 1988 (CLIA), 42 U.S.C.  section [phone], to perform high complexity tests. Performed at [place] [id number] [address], [place], [place] [address] Laboratory  Director: Loleta Books, MD Performed at [facility], [place] [address] [place], [place] [address]    Coronavirus Source NASOPHARYNGEAL  Final    Comment: Performed at [facility], [place] [address], [place], [place] [address]    Impression/Plan: Pt is a 58 y/o WF with metastatic breast CA on recent chemotherapy, uncontrolled DM, and CHF admitted with fever, concern for retropharyngeal abscess, LLL PNA with LT parapneumonic effusion.  1. Fever - resolved over the past 2.5 days while ABX continue to be held. Blood cxs and sputum/pleural fluid cxs all unrevealing at present, although admittedly pleural fluid cxs were obtained while pt had already been on several days of broad spectrum ABX. As GI sxs appear to have abbated, less concern for descending mediastinitis, particularly as f/u CTA of chest failed to show this process. Testing for CoVID-19 was negative on this admission. Pt is now s/p 10 days of vanc/zosyn for complicated LLL PNA with large pleural effusion, which has subsequently been drained and shown not to have re-accumulated over the past week.. In the event that fever recurs, she will likely require 3+ weeks of broad spectrum ABX as an OP. Cannot R/O malignant effusion or oncologic origin of her malignancy at this time as her repeat imaging does suggest persistent, active oncologic process. Pt is stable for d/c home from ID standpoint. > 30+ minutes spent on d/c planning alone, answering litany of questions asked by pt's sister and mother with whom she will be living after her hospital d/c.  2. Leukocytosis - now resolved, repeat CXR shows improving PNA w/o recurrence of pleural effusion following thoracentesis last week. ?l if recent elevation was due to the delayed effect of neupogen/neulasta given with recent chemotherapy as pt appears clinically stable at this time. Check CBC w/ diff in AM to re-assess now that ABX have been held.

## 2018-05-07 ENCOUNTER — Other Ambulatory Visit: Payer: BLUE CROSS/BLUE SHIELD

## 2018-05-07 ENCOUNTER — Ambulatory Visit: Payer: BLUE CROSS/BLUE SHIELD | Admitting: Hematology and Oncology

## 2018-05-07 ENCOUNTER — Ambulatory Visit: Payer: BLUE CROSS/BLUE SHIELD

## 2018-05-07 LAB — GLUCOSE, CAPILLARY: Glucose-Capillary: 179 mg/dL — ABNORMAL HIGH (ref 70–99)

## 2018-05-09 ENCOUNTER — Telehealth: Payer: Self-pay | Admitting: Hematology and Oncology

## 2018-05-09 ENCOUNTER — Other Ambulatory Visit: Payer: Self-pay | Admitting: Hematology and Oncology

## 2018-05-09 NOTE — Telephone Encounter (Signed)
Scheduled appt per sch msg. Called and spoke with patient to confirm date and time. Patient wants to see if MD can call her. Advised I will check with Alvy Bimler and get back to her. Patient will still come to appt if telephone call not doable.

## 2018-05-12 ENCOUNTER — Telehealth: Payer: Self-pay | Admitting: Emergency Medicine

## 2018-05-12 NOTE — Telephone Encounter (Signed)
Spoke with patient and her sister [name].  Pt reports feeling weakness and fatigue.  Denies N/V/D or urinary/bowel changes.  Denies fever/chills.  Reports cough, recently d/c from hospital with bilat pneumonia, negative for Covid-19.  Pt does not want to come in at this time to [facility] appt or for in-person appt with MD Alvy Bimler any earlier than scheduled d/t concerns about Covid-19.  Pt advised to push oral fluids, small but nutrient dense meals, rest, and take pain/nausea/other medication as prescribed for tonight, otherwise she needs to visit ED if she starts feeling worse.  Sending message to MD Gorsuch's/her desk RN to f/u with pt's home health nurses to possibly order labs/etc at home, and to see if a tele-health visit would be possible rather than pt come to [facility].  Pt/sister VU of plan.

## 2018-05-13 ENCOUNTER — Inpatient Hospital Stay (HOSPITAL_BASED_OUTPATIENT_CLINIC_OR_DEPARTMENT_OTHER): Payer: BLUE CROSS/BLUE SHIELD | Admitting: Hematology and Oncology

## 2018-05-13 ENCOUNTER — Telehealth: Payer: Self-pay | Admitting: *Deleted

## 2018-05-13 ENCOUNTER — Inpatient Hospital Stay: Payer: BLUE CROSS/BLUE SHIELD | Attending: Hematology and Oncology

## 2018-05-13 ENCOUNTER — Encounter: Payer: Self-pay | Admitting: Hematology and Oncology

## 2018-05-13 ENCOUNTER — Other Ambulatory Visit: Payer: Self-pay

## 2018-05-13 DIAGNOSIS — I700 Atherosclerosis of aorta: Secondary | ICD-10-CM | POA: Diagnosis not present

## 2018-05-13 DIAGNOSIS — C7951 Secondary malignant neoplasm of bone: Secondary | ICD-10-CM

## 2018-05-13 DIAGNOSIS — Z9981 Dependence on supplemental oxygen: Secondary | ICD-10-CM | POA: Diagnosis not present

## 2018-05-13 DIAGNOSIS — J9601 Acute respiratory failure with hypoxia: Secondary | ICD-10-CM

## 2018-05-13 DIAGNOSIS — D649 Anemia, unspecified: Secondary | ICD-10-CM | POA: Insufficient documentation

## 2018-05-13 DIAGNOSIS — R0602 Shortness of breath: Secondary | ICD-10-CM

## 2018-05-13 DIAGNOSIS — Z171 Estrogen receptor negative status [ER-]: Secondary | ICD-10-CM

## 2018-05-13 DIAGNOSIS — M545 Low back pain: Secondary | ICD-10-CM | POA: Insufficient documentation

## 2018-05-13 DIAGNOSIS — C7801 Secondary malignant neoplasm of right lung: Secondary | ICD-10-CM | POA: Diagnosis not present

## 2018-05-13 DIAGNOSIS — E1165 Type 2 diabetes mellitus with hyperglycemia: Secondary | ICD-10-CM | POA: Diagnosis not present

## 2018-05-13 DIAGNOSIS — D61818 Other pancytopenia: Secondary | ICD-10-CM

## 2018-05-13 DIAGNOSIS — W19XXXA Unspecified fall, initial encounter: Secondary | ICD-10-CM | POA: Insufficient documentation

## 2018-05-13 DIAGNOSIS — M549 Dorsalgia, unspecified: Secondary | ICD-10-CM | POA: Insufficient documentation

## 2018-05-13 DIAGNOSIS — R748 Abnormal levels of other serum enzymes: Secondary | ICD-10-CM | POA: Insufficient documentation

## 2018-05-13 DIAGNOSIS — Z794 Long term (current) use of insulin: Secondary | ICD-10-CM

## 2018-05-13 DIAGNOSIS — Z9181 History of falling: Secondary | ICD-10-CM | POA: Insufficient documentation

## 2018-05-13 DIAGNOSIS — R635 Abnormal weight gain: Secondary | ICD-10-CM | POA: Insufficient documentation

## 2018-05-13 DIAGNOSIS — Z5112 Encounter for antineoplastic immunotherapy: Secondary | ICD-10-CM | POA: Insufficient documentation

## 2018-05-13 DIAGNOSIS — C7931 Secondary malignant neoplasm of brain: Secondary | ICD-10-CM

## 2018-05-13 DIAGNOSIS — M79605 Pain in left leg: Secondary | ICD-10-CM | POA: Diagnosis not present

## 2018-05-13 DIAGNOSIS — K5909 Other constipation: Secondary | ICD-10-CM | POA: Insufficient documentation

## 2018-05-13 DIAGNOSIS — C787 Secondary malignant neoplasm of liver and intrahepatic bile duct: Secondary | ICD-10-CM | POA: Insufficient documentation

## 2018-05-13 DIAGNOSIS — Z5111 Encounter for antineoplastic chemotherapy: Secondary | ICD-10-CM | POA: Diagnosis present

## 2018-05-13 DIAGNOSIS — R5383 Other fatigue: Secondary | ICD-10-CM

## 2018-05-13 DIAGNOSIS — C50919 Malignant neoplasm of unspecified site of unspecified female breast: Secondary | ICD-10-CM

## 2018-05-13 DIAGNOSIS — R5381 Other malaise: Secondary | ICD-10-CM

## 2018-05-13 DIAGNOSIS — C50312 Malignant neoplasm of lower-inner quadrant of left female breast: Secondary | ICD-10-CM | POA: Insufficient documentation

## 2018-05-13 DIAGNOSIS — E440 Moderate protein-calorie malnutrition: Secondary | ICD-10-CM

## 2018-05-13 DIAGNOSIS — Z886 Allergy status to analgesic agent status: Secondary | ICD-10-CM | POA: Diagnosis not present

## 2018-05-13 DIAGNOSIS — Z8701 Personal history of pneumonia (recurrent): Secondary | ICD-10-CM | POA: Insufficient documentation

## 2018-05-13 DIAGNOSIS — C7802 Secondary malignant neoplasm of left lung: Secondary | ICD-10-CM | POA: Diagnosis not present

## 2018-05-13 DIAGNOSIS — S36892A Contusion of other intra-abdominal organs, initial encounter: Secondary | ICD-10-CM | POA: Insufficient documentation

## 2018-05-13 DIAGNOSIS — R300 Dysuria: Secondary | ICD-10-CM | POA: Insufficient documentation

## 2018-05-13 DIAGNOSIS — R600 Localized edema: Secondary | ICD-10-CM | POA: Insufficient documentation

## 2018-05-13 DIAGNOSIS — M79604 Pain in right leg: Secondary | ICD-10-CM | POA: Insufficient documentation

## 2018-05-13 LAB — CBC WITH DIFFERENTIAL (CANCER CENTER ONLY)
Abs Immature Granulocytes: 0.82 10*3/uL — ABNORMAL HIGH (ref 0.00–0.07)
Basophils Absolute: 0.3 10*3/uL — ABNORMAL HIGH (ref 0.0–0.1)
Basophils Relative: 3 %
Eosinophils Absolute: 0.2 10*3/uL (ref 0.0–0.5)
Eosinophils Relative: 2 %
HCT: 27.8 % — ABNORMAL LOW (ref 36.0–46.0)
Hemoglobin: 8.8 g/dL — ABNORMAL LOW (ref 12.0–15.0)
Immature Granulocytes: 8 %
Lymphocytes Relative: 15 %
Lymphs Abs: 1.5 10*3/uL (ref 0.7–4.0)
MCH: 27.9 pg (ref 26.0–34.0)
MCHC: 31.7 g/dL (ref 30.0–36.0)
MCV: 88.3 fL (ref 80.0–100.0)
Monocytes Absolute: 0.6 10*3/uL (ref 0.1–1.0)
Monocytes Relative: 6 %
Neutro Abs: 6.7 10*3/uL (ref 1.7–7.7)
Neutrophils Relative %: 66 %
Platelet Count: 58 10*3/uL — ABNORMAL LOW (ref 150–400)
RBC: 3.15 MIL/uL — ABNORMAL LOW (ref 3.87–5.11)
RDW: 17.6 % — ABNORMAL HIGH (ref 11.5–15.5)
WBC Count: 10.1 10*3/uL (ref 4.0–10.5)
nRBC: 0.2 % (ref 0.0–0.2)

## 2018-05-13 LAB — CMP (CANCER CENTER ONLY)
ALT: 9 U/L (ref 0–44)
AST: 13 U/L — ABNORMAL LOW (ref 15–41)
Albumin: 2.1 g/dL — ABNORMAL LOW (ref 3.5–5.0)
Alkaline Phosphatase: 342 U/L — ABNORMAL HIGH (ref 38–126)
Anion gap: 11 (ref 5–15)
BUN: 12 mg/dL (ref 6–20)
CO2: 31 mmol/L (ref 22–32)
Calcium: 9.7 mg/dL (ref 8.9–10.3)
Chloride: 92 mmol/L — ABNORMAL LOW (ref 98–111)
Creatinine: 0.8 mg/dL (ref 0.44–1.00)
GFR, Est AFR Am: >60 mL/min (ref >60)
GFR, Estimated: >60 mL/min (ref >60)
Glucose, Bld: 188 mg/dL — ABNORMAL HIGH (ref 70–99)
Potassium: 4.5 mmol/L (ref 3.5–5.1)
Sodium: 134 mmol/L — ABNORMAL LOW (ref 135–145)
Total Bilirubin: 1.6 mg/dL — ABNORMAL HIGH (ref 0.3–1.2)
Total Protein: 8.2 g/dL — ABNORMAL HIGH (ref 6.5–8.1)

## 2018-05-13 LAB — URIC ACID: Uric Acid, Serum: 3.8 mg/dL (ref 2.5–7.1)

## 2018-05-13 LAB — SAMPLE TO BLOOD BANK

## 2018-05-13 MED ORDER — TRAMADOL HCL 50 MG PO TABS: 50.0000 mg | ORAL_TABLET | Freq: Four times a day (QID) | ORAL | 0 refills | Status: DC | PRN

## 2018-05-13 NOTE — Progress Notes (Signed)
[place] OFFICE PROGRESS NOTE  Patient Care Team: Robyne Peers, MD as PCP - General (Family Medicine)  ASSESSMENT & PLAN:  Metastatic breast cancer [facility]) She is very weak.  Her performance status is borderline at PS 2-3 She is still recovering from recent pneumonia She is not ready to begin treatment this week I will reschedule her appointment and see her back next week for further assessment and supportive care  Pancytopenia, acquired [facility]) She has severe pancytopenia due to bone metastasis and bone marrow suppression from recent antibiotic therapy She does not need transfusion support today Her blood counts are still too low to proceed with treatment I plan to recheck blood count and see her back again next week for further assessment and plan of care  Metastasis to liver [facility]) She has persistent elevated liver enzymes due to liver metastasis.  Observe only for now  Uncontrolled diabetes mellitus with hyperglycemia (HCC) Due to poor oral intake, she had recurrent episode of hypoglycemia I recommend reducing the dose of short acting insulin especially due to poor oral intake She will continue her long-acting insulin once a day at 10 units for now  Malnutrition of moderate degree She has moderate to severe protein calorie malnutrition but overall is slowly improving I encouraged her to eat as much as possible  Metastasis to bone [facility]) She has bone pain in her sacrum but no signs of decubitus ulcer I recommend tramadol as needed I have refilled her prescription to her local pharmacy and will reassess pain control next week  Acute respiratory failure with hypoxia ([place]) The patient is still oxygen dependent She will continue oxygen therapy as prescribed  Physical debility She has profound physical debility due to recurrent hospitalization She will continue physical therapy and occupational therapy at home   No orders of the defined types were placed in  this encounter.   INTERVAL HISTORY: Please see below for problem oriented charting. She is seen urgently today due to complaint of excessive fatigue and shortness of breath She was discharged from the hospital approximately a week ago She is supposed to see me in 2 days time but due to concerns of feeling unwell, she is seen today She has been complaining of significant shortness of breath with minimal exertion She continues to use oxygen Since recent discharge from the hospital, she had physical therapy twice a week and occupational therapy that works with her at home She has taken multiple naps during daytime and does not have prolonged sleep at night She has significant pain in his sacrum area but denies new evidence of decubitus ulcer Her appetite is poor.  She usually just drink Ensure/nutritional supplements Her blood sugar is checked several times usually before each meal.  Her morning blood sugar today is about 137.  However, her pre-meal insulin ranges from 50-150.  She is not clear whether she will have jittery when her blood sugar runs low She denies recent changes in bowel habits such as constipation or diarrhea.  No nausea. She is dependent on family members for most activities of daily living.  She denies recent falls at home. She denies cough, fever or chills  SUMMARY OF ONCOLOGIC HISTORY: Oncology History   Biopsy is ER negative Her2/neu positive     Metastatic breast cancer ([place])   03/11/2018 Imaging    Ct scan of chest, abdomen and pelvis 1. 2.6 x 2.3 cm spiculated mass identified inferomedial quadrant of the left breast. Lesion appears to retract the  overlying skin. Mammographic correlation recommended. 2. Small lymph nodes in the left axillar ill-defined and suspicious for metastatic disease. 3. Multiple bilateral pulmonary nodules measuring up to 2.7 cm diameter. These probably represent metastatic disease. Given the dominant size of the central right upper lobe  lesion, synchronous lung primary can not be completely excluded. 4. Multiple ill-defined liver lesions compatible with metastatic disease. Dominant liver metastases measure up to almost 4 cm. 5.  Aortic Atherosclerois (ICD10-170.0)     03/12/2018 Pathology Results    Liver, needle/core biopsy, left lobe - METASTATIC CARCINOMA. - LYMPHOVASCULAR INVASION IS IDENTIFIED. - SEE COMMENT. Microscopic Comment The tumor cells are positive for cytokeratin 7. There is faint staining for GATA-3. There is non-specific staining for TTF-1. Cytokeratin 20, CDX-2, estrogen receptor, and GCDFP stains are negative. Given the clinical suspicion, the profile supports a primary breast carcinoma. Her2 will be performed and the results reported separately.  By immunohistochemistry, the tumor cells are POSITIVE for Her2 (3+).    03/12/2018 Imaging    Single 1.3 x 1.4 cm RIGHT occipital metastasis.  Borderline pachymeningeal enhancement of symmetric nature could be related to the superficial metastasis or osseous disease.    03/12/2018 Procedure    Ultrasound-guided core biopsy performed of a mass within the lateral segment of the left lobe of the liver.    03/16/2018 Imaging    Right occipital lobe 13 x 16 mm. Small satellite enhancing nodule deep to the larger lesion. The larger lesion shows central necrosis and probable mild hemorrhage or calcification.  Mild dural enhancement is less impressive and could be within normal limits.  Bone marrow in the clivus and cervical spine is diffusely low signal. No focal lesion. This may be related to the patient's anemia and abnormal blood count.    03/17/2018 Cancer Staging    Staging form: Breast, AJCC 8th Edition - Clinical: Stage IV (cT2, cN0, pM1, GX, ER-, PR: Not Assessed, HER2+) - Signed by [name], MD on 03/17/2018    03/19/2018 -  Chemotherapy    She received chemo with Taxotere, Herceptin and Perjeta    03/21/2018 Echocardiogram    IMPRESSIONS 1. The  left ventricle has normal systolic function with an ejection fraction of 60-65%. The cavity size was normal. Left ventricular diastolic Doppler parameters are consistent with impaired relaxation.  2. The right ventricle has normal systolic function. The cavity was normal. There is no increase in right ventricular wall thickness.  3. The mitral valve is normal in structure.  4. The tricuspid valve is normal in structure.  5. Strain imaging performed but not reported due to interpreter judgement, secondary to suboptimal image quality.    03/24/2018 Procedure    Successful placement of a right IJ approach Power Port with ultrasound and fluoroscopic guidance. The catheter is ready for use.    04/07/2018 - 04/16/2018 Hospital Admission    She was admitted for pneumonia    04/07/2018 - 04/16/2018 Hospital Admission    She was admitted to the hospital for pneumonia and respiratory failure    04/25/2018 Imaging    Retropharyngeal fluid collection compatible with effusion or possibly abscess. Soft tissue thickening extends into the right neck and surrounds the right carotid bifurcation and right internal carotid artery. There is also streaky density in the right lateral neck soft tissues with a focal 7.6 Mm lymph node in the right lateral neck. Stranding surrounds the right submandibular gland.   Findings are most compatible with infection. Favor pharyngitis with retropharyngeal effusion/abscess. Soft tissue  thickening around the right carotid most compatible with infection rather than tumor. Right jugular vein is patent.  Multiple nodular densities in the right upper lobe may represent residual pneumonia or metastatic disease. Interval improvement in right upper lobe infiltrate compared with CT of 04/08/2018    04/25/2018 - 05/06/2018 Hospital Admission    She was admitted for retropharyngeal abscess    04/27/2018 Imaging    No evidence of large central pulmonary embolus is noted in the main pulmonary artery  or the main portions of the right and left pulmonary arteries. However, evaluation of the lower lobe branches, particularly on the right, is limited due to respiratory motion artifact and other limiting issues. Pulmonary emboli and smaller peripheral branches can not be excluded on the basis of this exam.  Large left pleural effusion is noted with complete atelectasis of the left lower lobe. Increased left upper lobe opacity is noted posteriorly concerning for atelectasis or pneumonia.  Improved right upper lobe and lower lobe opacities are noted suggesting improving pneumonia or atelectasis.  15 mm right paratracheal lymph node is noted which is enlarged compared to prior exam; it is uncertain if this is metastatic or inflammatory in etiology.  Hepatic metastatic lesions are again noted.  Aortic Atherosclerosis (ICD10-I70.0).     04/27/2018 Procedure    Successful ultrasound guided left thoracentesis yielding 120 mL of blood-tinged of pleural fluid.     Metastasis to brain ([place])   03/17/2018 Initial Diagnosis    Metastasis to brain [facility])     Metastasis to liver ([place])   03/17/2018 Initial Diagnosis    Metastasis to liver [facility])     Metastasis to bone ([place])   03/17/2018 Initial Diagnosis    Metastasis to bone (HCC)     REVIEW OF SYSTEMS:   Constitutional: Denies fevers, chills or abnormal weight loss Eyes: Denies blurriness of vision Ears, nose, mouth, throat, and face: Denies mucositis or sore throat Cardiovascular: Denies palpitation, chest discomfort or lower extremity swelling Gastrointestinal:  Denies nausea, heartburn or change in bowel habits Skin: Denies abnormal skin rashes Lymphatics: Denies new lymphadenopathy or easy bruising Behavioral/Psych: Mood is stable, no new changes  All other systems were reviewed with the patient and are negative.  I have reviewed the past medical history, past surgical history, social history and family history with the patient and  they are unchanged from previous note.  ALLERGIES:  is allergic to nsaids.  MEDICATIONS:  Current Outpatient Medications  Medication Sig Dispense Refill  . traMADol (ULTRAM) 50 MG tablet Take 1 tablet (50 mg total) by mouth every 6 (six) hours as needed. 30 tablet 0  . ACCU-CHEK AVIVA PLUS test strip     . ACCU-CHEK SOFTCLIX LANCETS lancets     . albuterol (PROAIR HFA) 108 (90 Base) MCG/ACT inhaler Inhale 2 puffs into the lungs every 6 (six) hours as needed for wheezing or shortness of breath. 1 Inhaler 1  . Amino Acids-Protein Hydrolys (FEEDING SUPPLEMENT, PRO-STAT SUGAR FREE 64,) LIQD Take 30 mLs by mouth daily for 7 days. 7 Bottle 0  . benzonatate (TESSALON) 200 MG capsule Take 1 capsule (200 mg total) by mouth 3 (three) times daily. 20 capsule 0  . blood glucose meter kit and supplies KIT Dispense based on patient and insurance preference. Use up to four times daily as directed. (FOR ICD-9 250.00, 250.01). 1 each 0  . chlorpheniramine-HYDROcodone (TUSSIONEX) 10-8 MG/5ML SUER Take 5 mLs by mouth every 12 (twelve) hours. 140 mL 0  . cyclobenzaprine (FLEXERIL)  10 MG tablet Take 1 tablet (10 mg total) by mouth 2 (two) times daily as needed for muscle spasms. 14 tablet 0  . dexamethasone (DECADRON) 4 MG tablet Take 1 tablet in the morning before chemo, none on the day of chemo and daily in the morning after chemo for 2 days, with food 30 tablet 1  . esomeprazole (NEXIUM) 40 MG capsule Take 1 capsule (40 mg total) by mouth 2 (two) times daily before a meal. 180 capsule 1  . feeding supplement, ENSURE ENLIVE, (ENSURE ENLIVE) LIQD Take 237 mLs by mouth 2 (two) times daily between meals for 7 days. 14 Bottle 0  . fluticasone (FLOVENT HFA) 44 MCG/ACT inhaler Inhale 2 puffs into the lungs 2 (two) times daily. 3 Inhaler 1  . insulin aspart (NOVOLOG) 100 UNIT/ML FlexPen Inject 8 Units into the skin 3 (three) times daily with meals. 15 mL 0  . Insulin Detemir (LEVEMIR) 100 UNIT/ML Pen Inject 10 Units into  the skin daily. 15 mL 0  . Insulin Pen Needle 31G X 5 MM MISC 1 Device by Does not apply route QID. For use with insulin pens 100 each 0  . lidocaine-prilocaine (EMLA) cream Apply to affected area once (Patient taking differently: Apply 1 application topically as needed (port access). Apply to affected area once) 30 g 3  . lip balm (CARMEX) ointment Apply topically as needed for lip care. 7 g 0  . loperamide (IMODIUM A-D) 2 MG tablet Take 1 tablet (2 mg total) by mouth 4 (four) times daily as needed for diarrhea or loose stools. 30 tablet 0  . LORazepam (ATIVAN) 0.5 MG tablet Take 1 tab po 30 minutes prior to radiation or MRI 30 tablet 0  . metoprolol succinate (TOPROL-XL) 50 MG 24 hr tablet Take 50 mg by mouth every evening.    . montelukast (SINGULAIR) 10 MG tablet TAKE 1 TABLET BY MOUTH EVERYDAY AT BEDTIME (Patient taking differently: Take 10 mg by mouth daily. ) 90 tablet 0  . Multiple Vitamin (MULTIVITAMIN WITH MINERALS) TABS tablet Take 1 tablet by mouth daily.     No current facility-administered medications for this visit.     PHYSICAL EXAMINATION: ECOG PERFORMANCE STATUS: 3 - Symptomatic, >50% confined to bed  Vitals:   05/13/18 1130  BP: 102/64  Pulse: (!) 131  Resp: 18  Temp: 99 F (37.2 C)  SpO2: 98%   Filed Weights   05/13/18 1130  Weight: 142 lb (64.4 kg)    GENERAL:alert, no distress and comfortable.  She looks frail with oxygen delivered via nasal cannula.  She has lost a lot of weight since last time I saw her SKIN: skin color is pale, texture, turgor are normal, no rashes or significant lesions EYES: normal, Conjunctiva are pale and non-injected, sclera clear OROPHARYNX:no exudate, no erythema and lips, buccal mucosa, and tongue normal  NECK: supple, thyroid normal size, non-tender, without nodularity LYMPH:  no palpable lymphadenopathy in the cervical, axillary or inguinal LUNGS: clear to auscultation and percussion with normal breathing effort HEART:  tachycardia, no murmurs with mild to moderate lower extremity edema ABDOMEN:abdomen soft, non-tender and normal bowel sounds Musculoskeletal:no cyanosis of digits and no clubbing  NEURO: alert & oriented x 3 with fluent speech, no focal motor/sensory deficits  LABORATORY DATA:  I have reviewed the data as listed    Component Value Date/Time   NA 134 (L) 05/13/2018 1051   K 4.5 05/13/2018 1051   CL 92 (L) 05/13/2018 1051   CO2 31  05/13/2018 1051   GLUCOSE 188 (H) 05/13/2018 1051   BUN 12 05/13/2018 1051   CREATININE 0.80 05/13/2018 1051   CALCIUM 9.7 05/13/2018 1051   PROT 8.2 (H) 05/13/2018 1051   ALBUMIN 2.1 (L) 05/13/2018 1051   AST 13 (L) 05/13/2018 1051   ALT 9 05/13/2018 1051   ALKPHOS 342 (H) 05/13/2018 1051   BILITOT 1.6 (H) 05/13/2018 1051   GFRNONAA >60 05/13/2018 1051   GFRAA >60 05/13/2018 1051    No results found for: SPEP, UPEP  Lab Results  Component Value Date   WBC 10.1 05/13/2018   NEUTROABS 6.7 05/13/2018   HGB 8.8 (L) 05/13/2018   HCT 27.8 (L) 05/13/2018   MCV 88.3 05/13/2018   PLT 58 (L) 05/13/2018      Chemistry      Component Value Date/Time   NA 134 (L) 05/13/2018 1051   K 4.5 05/13/2018 1051   CL 92 (L) 05/13/2018 1051   CO2 31 05/13/2018 1051   BUN 12 05/13/2018 1051   CREATININE 0.80 05/13/2018 1051      Component Value Date/Time   CALCIUM 9.7 05/13/2018 1051   ALKPHOS 342 (H) 05/13/2018 1051   AST 13 (L) 05/13/2018 1051   ALT 9 05/13/2018 1051   BILITOT 1.6 (H) 05/13/2018 1051       RADIOGRAPHIC STUDIES: I have personally reviewed the radiological images as listed and agreed with the findings in the report. Dg Chest 1 View  Result Date: 04/29/2018 CLINICAL DATA:  Tachypnea EXAM: CHEST  1 VIEW COMPARISON:  Chest CT from 2 days ago FINDINGS: There is small left pleural effusion and multifocal consolidation, particularly dense in the left lower lobe by prior CT. No superimposed Kerley lines. No pneumothorax. Stable heart size.  Porta catheter on the right with tip at the SVC. IMPRESSION: Multifocal pneumonia and small left pleural effusion. Mild worsening of aeration at the right base since 2 days ago. Electronically Signed   By: Monte [name] M.D.   On: 04/29/2018 05:44   Dg Chest 2 View  Result Date: 05/05/2018 CLINICAL DATA:  Pneumonia. EXAM: CHEST - 2 VIEW COMPARISON:  05/01/2018 FINDINGS: The heart size and mediastinal contours are within normal limits. Stable appearance of Port-A-Cath. Since the prior chest x-ray both lungs show increased expansion. Extensive bilateral pneumonia remains present in both right upper and lower lungs and the left lower lobe. Aeration of the left lower lobe mildly improved. Very small bilateral pleural effusions are present. No overt pulmonary edema. No pneumothorax. The visualized skeletal structures are unremarkable. IMPRESSION: Improved expansion of both lungs and improved aeration of the left lower lobe. Bilateral pneumonia remains, right greater than left. Very small bilateral pleural effusions. Electronically Signed   By: Aletta Edouard M.D.   On: 05/05/2018 08:38   Ct Soft Tissue Neck W Contrast  Result Date: 04/25/2018 CLINICAL DATA:  Fever. Recent admission for pneumonia and sepsis. Metastatic breast cancer. EXAM: CT NECK WITH CONTRAST TECHNIQUE: Multidetector CT imaging of the neck was performed using the standard protocol following the bolus administration of intravenous contrast. CONTRAST:  95m OMNIPAQUE IOHEXOL 300 MG/ML  SOLN COMPARISON:  None. FINDINGS: Pharynx and larynx: Moderately large retropharyngeal fluid collection compatible with effusion. This measures up to 10 mm in thickness. This appears to extend into the right neck where there is soft tissue edema around the right carotid artery extending into the right neck soft tissues. No pharyngeal or tonsillar abscess. Epiglottis normal Salivary glands: No inflammation, mass, or stone. There  is edema surrounding the right  submandibular gland which appears extrinsic to the gland. Thyroid: Negative Lymph nodes: Right lateral lymph node measuring 7.6 mm likely reactive,, with surrounding edema. Otherwise no enlarged or prominent lymph nodes. Vascular: Right jugular Port-A-Cath in good position. Jugular vein patent bilaterally. Normal vascular enhancement. Prominent soft tissue is seen surrounding the right carotid artery. This is likely inflammatory given the retropharyngeal effusion as well as recent sepsis. Limited intracranial: No acute abnormality. Known right occipital metastatic deposit not imaged on the current study. Visualized orbits: Negative Mastoids and visualized paranasal sinuses: Mucoperiosteal thickening in the paranasal sinuses with prior sinus surgery. Orbits not imaged Skeleton: No acute skeletal abnormality. Upper chest: Right upper lobe nodular densities. Interval improvement in right upper lobe airspace disease since 04/08/2018. Residual nodule densities may be metastatic disease or pneumonia. Small bilateral pleural effusions. Other: None IMPRESSION: Retropharyngeal fluid collection compatible with effusion or possibly abscess. Soft tissue thickening extends into the right neck and surrounds the right carotid bifurcation and right internal carotid artery. There is also streaky density in the right lateral neck soft tissues with a focal 7.6 Mm lymph node in the right lateral neck. Stranding surrounds the right submandibular gland. Findings are most compatible with infection. Favor pharyngitis with retropharyngeal effusion/abscess. Soft tissue thickening around the right carotid most compatible with infection rather than tumor. Right jugular vein is patent. Multiple nodular densities in the right upper lobe may represent residual pneumonia or metastatic disease. Interval improvement in right upper lobe infiltrate compared with CT of 04/08/2018 These results were called by telephone at the time of interpretation on  04/25/2018 at 12:36 pm to Dr. Thomes Dinning , who verbally acknowledged these results. Electronically Signed   By: Franchot Gallo M.D.   On: 04/25/2018 12:39   Ct Angio Chest Pe W Or Wo Contrast  Addendum Date: 04/27/2018   ADDENDUM REPORT: 04/27/2018 14:04 ADDENDUM: Upon further review, there is not a large pleural effusion. There is noted complete consolidation of the left lower lobe with mild-to-moderate amount of surrounding fluid or effusion. Electronically Signed   By: Marijo Conception, M.D.   On: 04/27/2018 14:04   Result Date: 04/27/2018 CLINICAL DATA:  Interstitial lung disease.  Fever, cough. EXAM: CT ANGIOGRAPHY CHEST WITH CONTRAST TECHNIQUE: Multidetector CT imaging of the chest was performed using the standard protocol during bolus administration of intravenous contrast. Multiplanar CT image reconstructions and MIPs were obtained to evaluate the vascular anatomy. CONTRAST:  174m OMNIPAQUE IOHEXOL 350 MG/ML SOLN COMPARISON:  CT scan of April 08, 2018. FINDINGS: Cardiovascular: No evidence of large central pulmonary embolus is noted. However, evaluation of the lower lobe branches is limited due to respiratory motion artifact. Pulmonary emboli in small peripheral branches can not be excluded. No evidence of thoracic aortic dissection or aneurysm. Mild cardiomegaly is noted. No pericardial effusion is noted. Mediastinum/Nodes: Thyroid gland and esophagus are unremarkable. Stable calcified subcarinal and right hilar adenopathy is noted. 15 mm right paratracheal lymph node is noted which is enlarged compared to prior exam. Stable 1 cm pretracheal lymph node is noted. Lungs/Pleura: No pneumothorax is noted. Large left pleural effusion is noted with atelectasis of the left lower lobe. Subsegmental atelectasis or pneumonia of the left upper lobe is noted as well. Decreased right upper and lower lobe opacities are noted suggesting improving pneumonia or atelectasis. Upper Abdomen: Stable hepatic metastases  are noted. Musculoskeletal: No chest wall abnormality. No acute or significant osseous findings. Review of the MIP images confirms the above  findings. IMPRESSION: No evidence of large central pulmonary embolus is noted in the main pulmonary artery or the main portions of the right and left pulmonary arteries. However, evaluation of the lower lobe branches, particularly on the right, is limited due to respiratory motion artifact and other limiting issues. Pulmonary emboli and smaller peripheral branches can not be excluded on the basis of this exam. Large left pleural effusion is noted with complete atelectasis of the left lower lobe. Increased left upper lobe opacity is noted posteriorly concerning for atelectasis or pneumonia. Improved right upper lobe and lower lobe opacities are noted suggesting improving pneumonia or atelectasis. 15 mm right paratracheal lymph node is noted which is enlarged compared to prior exam; it is uncertain if this is metastatic or inflammatory in etiology. Hepatic metastatic lesions are again noted. Aortic Atherosclerosis (ICD10-I70.0). Electronically Signed: By: Marijo Conception, M.D. On: 04/27/2018 12:37   Dg Chest Port 1 View  Result Date: 05/01/2018 CLINICAL DATA:  Respiratory failure EXAM: PORTABLE CHEST 1 VIEW COMPARISON:  04/29/2018 FINDINGS: Right Port-A-Cath remains in place, unchanged. Bilateral airspace disease, worsening on the right since prior study. Layering effusions noted. Heart is borderline in size. IMPRESSION: Bilateral airspace opacities, worsening on the right since prior study. Bilateral layering effusions. Electronically Signed   By: Rolm Baptise M.D.   On: 05/01/2018 07:21   Dg Chest Port 1 View  Result Date: 04/27/2018 CLINICAL DATA:  Status post left thoracentesis. EXAM: PORTABLE CHEST 1 VIEW COMPARISON:  Radiograph of same day. FINDINGS: Stable cardiomediastinal silhouette. Right internal jugular Port-A-Cath is unchanged in position. Stable right upper  lobe opacity is noted. Stable left basilar opacity is noted consistent with pneumonia or atelectasis and associated pleural effusion. No pneumothorax is noted. Bony thorax is unremarkable. IMPRESSION: No pneumothorax status post left-sided thoracentesis. Stable left basilar opacity is noted consistent with pneumonia or atelectasis and associated pleural effusion. Stable right upper lobe opacity is noted which may represent acute inflammation or possibly scarring. Electronically Signed   By: Marijo Conception, M.D.   On: 04/27/2018 10:36   Dg Chest Port 1 View  Result Date: 04/27/2018 CLINICAL DATA:  Hypoxia EXAM: PORTABLE CHEST 1 VIEW COMPARISON:  04/24/2018 FINDINGS: Right upper lobe opacity, reflecting residual pneumonia/post infectious scarring, improved from prior CT. Moderate layering left pleural effusion, significantly increased from recent chest radiograph. Associated left upper lobe opacity, possibly reflecting infection and/or compressive atelectasis. No pneumothorax. The heart is top-normal in size. Right chest power port terminates in the cavoatrial junction. IMPRESSION: Moderate layering left pleural effusion, significantly increased. Associated left upper lobe opacity, possibly reflecting infection and/or compressive atelectasis. Right upper lobe opacity, reflecting residual pneumonia/post infectious scarring, improved from prior CT. Electronically Signed   By: Julian Hy M.D.   On: 04/27/2018 06:30   Dg Chest Port 1 View  Result Date: 04/24/2018 CLINICAL DATA:  Fever.  Metastatic breast carcinoma EXAM: PORTABLE CHEST 1 VIEW COMPARISON:  April 12, 2018 FINDINGS: There has been significant clearing of consolidation from the right upper lobe. A small amount of patchy opacity with volume loss remains in this area. Atelectasis remains in the left base. No new opacity evident. Heart is upper normal in size with pulmonary vascularity normal. No adenopathy. There is aortic atherosclerosis.  Port-A-Cath tip is in the superior vena cava. No pneumothorax. No bone lesions are appreciable. IMPRESSION: Significant partial clearing of consolidation from the right upper lobe. Patchy infiltrate and volume loss remains in this area. Stable left base atelectasis. No new opacity.  Stable cardiac silhouette. Stable Port-A-Cath positioning. Aortic Atherosclerosis (ICD10-I70.0). Electronically Signed   By: Lowella Grip III M.D.   On: 04/24/2018 09:32   US Thoracentesis Asp Pleural Space W/img Guide  Result Date: 04/27/2018 INDICATION: Patient with shortness of breath, pleural effusion. Request is made for diagnostic and therapeutic left thoracentesis. EXAM: ULTRASOUND GUIDED DIAGNOSTIC AND THERAPEUTIC LEFT THORACENTESIS MEDICATIONS: 10 mL 1% lidocaine COMPLICATIONS: None immediate. PROCEDURE: An ultrasound guided thoracentesis was thoroughly discussed with the patient and questions answered. The benefits, risks, alternatives and complications were also discussed. The patient understands and wishes to proceed with the procedure. Written consent was obtained. Ultrasound was performed to localize and mark an adequate pocket of fluid in the left chest. The area was then prepped and draped in the normal sterile fashion. 1% Lidocaine was used for local anesthesia. Under ultrasound guidance a 6 Fr Safe-T-Centesis catheter was introduced. Thoracentesis was performed. The catheter was removed and a dressing applied. FINDINGS: Pleural fluid volume by ultrasound is small. A total of approximately 120 mL of blood-tinged fluid was removed. Samples were sent to the laboratory as requested by the clinical team. IMPRESSION: Successful ultrasound guided left thoracentesis yielding 120 mL of blood-tinged of pleural fluid. Read by: Brynda Greathouse PA-C Electronically Signed   By: Aletta Edouard M.D.   On: 04/27/2018 09:59    All questions were answered. The patient knows to call the clinic with any problems, questions or  concerns. No barriers to learning was detected.  I spent 40 minutes counseling the patient face to face. The total time spent in the appointment was 55 minutes and more than 50% was on counseling and review of test results  [name], MD 05/13/2018 1:27 PM

## 2018-05-13 NOTE — Assessment & Plan Note (Signed)
She has moderate to severe protein calorie malnutrition but overall is slowly improving I encouraged her to eat as much as possible

## 2018-05-13 NOTE — Assessment & Plan Note (Signed)
She has profound physical debility due to recurrent hospitalization She will continue physical therapy and occupational therapy at home

## 2018-05-13 NOTE — Assessment & Plan Note (Signed)
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

## 2018-05-13 NOTE — Assessment & Plan Note (Signed)
She is very weak.  Her performance status is borderline at PS 2-3 She is still recovering from recent pneumonia She is not ready to begin treatment this week I will reschedule her appointment and see her back next week for further assessment and supportive care

## 2018-05-13 NOTE — Assessment & Plan Note (Signed)
She has bone pain in her sacrum but no signs of decubitus ulcer I recommend tramadol as needed I have refilled her prescription to her local pharmacy and will reassess pain control next week

## 2018-05-13 NOTE — Assessment & Plan Note (Signed)
She has persistent elevated liver enzymes due to liver metastasis.  Observe only for now

## 2018-05-13 NOTE — Telephone Encounter (Signed)
Hi Clarise Cruz,  Can you call her? The only thought I have is whether she is anemic needing transfusion I can see her today if she is willing

## 2018-05-13 NOTE — Telephone Encounter (Signed)
Called, spoke with the patient and gave the appts for next week

## 2018-05-13 NOTE — Assessment & Plan Note (Signed)
The patient is still oxygen dependent She will continue oxygen therapy as prescribed

## 2018-05-13 NOTE — Telephone Encounter (Signed)
Patient would like to come in today at 1030 for lab and to see Dr Alvy Bimler. Appts added to the schedule.

## 2018-05-13 NOTE — Assessment & Plan Note (Signed)
Due to poor oral intake, she had recurrent episode of hypoglycemia I recommend reducing the dose of short acting insulin especially due to poor oral intake She will continue her long-acting insulin once a day at 10 units for now

## 2018-05-14 ENCOUNTER — Telehealth: Payer: Self-pay

## 2018-05-14 NOTE — Telephone Encounter (Signed)
[name] nurse with [facility] called and left a message. Stacy Shelton fell yesterday at home, her sister found her on the floor. She has a large bruise on her left buttocks. No new complaints of pain. She has a stage 1 area to sacrum, nurse will continue to monitor. [name] instructed on repositioning. She instructed Eva to increase fluid intake, heart rate today was 104 and bp- 98/60. She is taking all of her medications.

## 2018-05-15 ENCOUNTER — Other Ambulatory Visit: Payer: Self-pay | Admitting: Hematology and Oncology

## 2018-05-15 ENCOUNTER — Telehealth: Payer: Self-pay

## 2018-05-15 ENCOUNTER — Other Ambulatory Visit: Payer: BLUE CROSS/BLUE SHIELD

## 2018-05-15 ENCOUNTER — Ambulatory Visit: Payer: BLUE CROSS/BLUE SHIELD | Admitting: Hematology and Oncology

## 2018-05-15 DIAGNOSIS — R300 Dysuria: Secondary | ICD-10-CM

## 2018-05-15 DIAGNOSIS — G893 Neoplasm related pain (acute) (chronic): Secondary | ICD-10-CM

## 2018-05-15 MED ORDER — MORPHINE SULFATE 15 MG PO TABS: 15.0000 mg | ORAL_TABLET | ORAL | 0 refills | Status: DC | PRN

## 2018-05-15 MED ORDER — BENZONATATE 200 MG PO CAPS: 200.0000 mg | ORAL_CAPSULE | Freq: Three times a day (TID) | ORAL | 3 refills | Status: DC

## 2018-05-15 NOTE — Telephone Encounter (Signed)
I will add urine test next visit

## 2018-05-15 NOTE — Telephone Encounter (Signed)
Called and given below message to sister [name]. She verbalized understanding.

## 2018-05-15 NOTE — Telephone Encounter (Signed)
I will refill Tessalon  I will send prescription for IR morphine; advise her to start with 1/2 tab. If not too sedated she can increase to 1 tab as needed. Watch for constipation. Advise her to write down on a journal her pain score before and after taking morphine, writing down date and time when she takes and bring it next week

## 2018-05-15 NOTE — Telephone Encounter (Signed)
[name], nurse with [facility] called back and left a message. She offered and gel overlay for the bed yesterday. [name] declined offer of the gel overlay, she has a new mattress. They will use a barrier cream for now to the sacrum. She forgot to mention yesterday that the sister said Stacy Shelton is almost a little confused at times.

## 2018-05-15 NOTE — Telephone Encounter (Signed)
Sister called and left a message to call her.  Called back and spoke to sister and Janin on the speaker. Juda is requesting stronger pain medication, something stronger then the Tramadol. She is requesting a refill on Tessalon for cough, she took the last one. She is afraid she may need them and would like to have them on hand if needed. She is asking that if Dr. Alvy Bimler is agreeable to send Rx to [facility] in [facility].

## 2018-05-15 NOTE — Telephone Encounter (Signed)
Can [facility] order special mattress for decubitus? Stacy Shelton was in pain sitting on her wheelchair when I saw her

## 2018-05-15 NOTE — Telephone Encounter (Signed)
Caren Macadam, nurse back and left below message. Ask her to call the office back.

## 2018-05-16 ENCOUNTER — Ambulatory Visit: Payer: BLUE CROSS/BLUE SHIELD

## 2018-05-16 ENCOUNTER — Telehealth: Payer: Self-pay

## 2018-05-16 NOTE — Telephone Encounter (Signed)
Called and given below message to [place], nurse with [facility]. She verbalized understanding and will pass call patient.

## 2018-05-16 NOTE — Telephone Encounter (Signed)
I suggest during day time when she is awake, she can try to reduce to 2 liters with goal to keep above 90% only At night time or when she is sleeping, keep it at 3 L Please document her oxygen sats along with her pain journal and bring them in next week

## 2018-05-16 NOTE — Telephone Encounter (Signed)
[name], nurse with [facility] called and left a message. She saw Stacy Shelton today. Her heart rate was 108. She is still using oxygen at 3 liters, sat's 99%. [name] is asking if they should decrease oxygen or leave it as it is?

## 2018-05-19 ENCOUNTER — Emergency Department (HOSPITAL_COMMUNITY)
Admission: EM | Admit: 2018-05-19 | Discharge: 2018-05-19 | Disposition: A | Discharge status: Home / Self Care | Payer: BLUE CROSS/BLUE SHIELD | Attending: Emergency Medicine | Admitting: Emergency Medicine

## 2018-05-19 ENCOUNTER — Emergency Department (HOSPITAL_COMMUNITY): Payer: BLUE CROSS/BLUE SHIELD

## 2018-05-19 ENCOUNTER — Telehealth: Payer: Self-pay | Admitting: *Deleted

## 2018-05-19 ENCOUNTER — Other Ambulatory Visit: Payer: Self-pay

## 2018-05-19 ENCOUNTER — Encounter (HOSPITAL_COMMUNITY): Payer: Self-pay | Admitting: Emergency Medicine

## 2018-05-19 DIAGNOSIS — Y929 Unspecified place or not applicable: Secondary | ICD-10-CM | POA: Diagnosis not present

## 2018-05-19 DIAGNOSIS — Y999 Unspecified external cause status: Secondary | ICD-10-CM | POA: Insufficient documentation

## 2018-05-19 DIAGNOSIS — I5022 Chronic systolic (congestive) heart failure: Secondary | ICD-10-CM | POA: Insufficient documentation

## 2018-05-19 DIAGNOSIS — E119 Type 2 diabetes mellitus without complications: Secondary | ICD-10-CM | POA: Insufficient documentation

## 2018-05-19 DIAGNOSIS — S3991XA Unspecified injury of abdomen, initial encounter: Secondary | ICD-10-CM | POA: Diagnosis not present

## 2018-05-19 DIAGNOSIS — Y939 Activity, unspecified: Secondary | ICD-10-CM | POA: Insufficient documentation

## 2018-05-19 DIAGNOSIS — M79662 Pain in left lower leg: Secondary | ICD-10-CM | POA: Diagnosis not present

## 2018-05-19 DIAGNOSIS — D6481 Anemia due to antineoplastic chemotherapy: Secondary | ICD-10-CM | POA: Diagnosis not present

## 2018-05-19 DIAGNOSIS — Z794 Long term (current) use of insulin: Secondary | ICD-10-CM | POA: Insufficient documentation

## 2018-05-19 DIAGNOSIS — M79661 Pain in right lower leg: Secondary | ICD-10-CM | POA: Insufficient documentation

## 2018-05-19 DIAGNOSIS — M25559 Pain in unspecified hip: Secondary | ICD-10-CM | POA: Diagnosis present

## 2018-05-19 DIAGNOSIS — M79604 Pain in right leg: Secondary | ICD-10-CM

## 2018-05-19 DIAGNOSIS — Z5329 Procedure and treatment not carried out because of patient's decision for other reasons: Secondary | ICD-10-CM | POA: Diagnosis not present

## 2018-05-19 DIAGNOSIS — C7951 Secondary malignant neoplasm of bone: Secondary | ICD-10-CM | POA: Insufficient documentation

## 2018-05-19 DIAGNOSIS — W19XXXA Unspecified fall, initial encounter: Secondary | ICD-10-CM | POA: Insufficient documentation

## 2018-05-19 DIAGNOSIS — R509 Fever, unspecified: Secondary | ICD-10-CM

## 2018-05-19 DIAGNOSIS — M79605 Pain in left leg: Secondary | ICD-10-CM

## 2018-05-19 LAB — CBC WITH DIFFERENTIAL/PLATELET
Band Neutrophils: 5 %
Basophils Absolute: 1.1 10*3/uL — ABNORMAL HIGH (ref 0.0–0.1)
Basophils Relative: 6 %
Blasts: 6 %
Eosinophils Absolute: 0.2 10*3/uL (ref 0.0–0.5)
Eosinophils Relative: 1 %
HCT: 20.4 % — ABNORMAL LOW (ref 36.0–46.0)
Hemoglobin: 6.3 g/dL — CL (ref 12.0–15.0)
Lymphocytes Relative: 11 %
Lymphs Abs: 2 10*3/uL (ref 0.7–4.0)
MCH: 27.8 pg (ref 26.0–34.0)
MCHC: 30.9 g/dL (ref 30.0–36.0)
MCV: 89.9 fL (ref 80.0–100.0)
Metamyelocytes Relative: 10 %
Monocytes Absolute: 0.4 10*3/uL (ref 0.1–1.0)
Monocytes Relative: 2 %
Myelocytes: 3 %
Neutro Abs: 13.2 10*3/uL — ABNORMAL HIGH (ref 1.7–7.7)
Neutrophils Relative %: 55 %
Other: 0 %
Platelets: 35 10*3/uL — ABNORMAL LOW (ref 150–400)
Promyelocytes Relative: 1 %
RBC: 2.27 MIL/uL — ABNORMAL LOW (ref 3.87–5.11)
RDW: 17.6 % — ABNORMAL HIGH (ref 11.5–15.5)
WBC: 17.9 10*3/uL — ABNORMAL HIGH (ref 4.0–10.5)
nRBC: 0 % (ref 0.0–0.2)
nRBC: 0 /100 WBC

## 2018-05-19 LAB — COMPREHENSIVE METABOLIC PANEL
ALT: 21 U/L (ref 0–44)
AST: 40 U/L (ref 15–41)
Albumin: 2.1 g/dL — ABNORMAL LOW (ref 3.5–5.0)
Alkaline Phosphatase: 298 U/L — ABNORMAL HIGH (ref 38–126)
Anion gap: 12 (ref 5–15)
BUN: 24 mg/dL — ABNORMAL HIGH (ref 6–20)
CO2: 30 mmol/L (ref 22–32)
Calcium: 8.5 mg/dL — ABNORMAL LOW (ref 8.9–10.3)
Chloride: 89 mmol/L — ABNORMAL LOW (ref 98–111)
Creatinine, Ser: 0.72 mg/dL (ref 0.44–1.00)
GFR calc Af Amer: >60 mL/min (ref >60)
GFR calc non Af Amer: >60 mL/min (ref >60)
Glucose, Bld: 114 mg/dL — ABNORMAL HIGH (ref 70–99)
Potassium: 3.9 mmol/L (ref 3.5–5.1)
Sodium: 131 mmol/L — ABNORMAL LOW (ref 135–145)
Total Bilirubin: 1.5 mg/dL — ABNORMAL HIGH (ref 0.3–1.2)
Total Protein: 6.9 g/dL (ref 6.5–8.1)

## 2018-05-19 LAB — LACTIC ACID, PLASMA: Lactic Acid, Venous: 0.9 mmol/L (ref 0.5–1.9)

## 2018-05-19 LAB — CK: Total CK: 34 U/L — ABNORMAL LOW (ref 38–234)

## 2018-05-19 MED ORDER — HEPARIN SOD (PORK) LOCK FLUSH 100 UNIT/ML IV SOLN: 500.0000 [IU] | Freq: Once | INTRAVENOUS | Status: DC

## 2018-05-19 MED ORDER — SODIUM CHLORIDE 0.9 % IV SOLN
2.0000 g | Freq: Once | INTRAVENOUS | Status: AC
  Administered 2018-05-19: 2 g via INTRAVENOUS
  Filled 2018-05-19: qty 2

## 2018-05-19 MED ORDER — SODIUM CHLORIDE 0.9 % IV BOLUS (SEPSIS)
1000.0000 mL | Freq: Once | INTRAVENOUS | Status: AC
  Administered 2018-05-19: 13:00:00 1000 mL via INTRAVENOUS

## 2018-05-19 MED ORDER — VANCOMYCIN HCL 10 G IV SOLR
1250.0000 mg | Freq: Once | INTRAVENOUS | Status: DC
  Filled 2018-05-19: qty 1250

## 2018-05-19 MED ORDER — HEPARIN SOD (PORK) LOCK FLUSH 100 UNIT/ML IV SOLN
INTRAVENOUS | Status: AC
  Administered 2018-05-19: 19:00:00 500 [IU]
  Filled 2018-05-19: qty 5

## 2018-05-19 MED ORDER — ONDANSETRON HCL 4 MG/2ML IJ SOLN
4.0000 mg | Freq: Once | INTRAMUSCULAR | Status: AC
  Administered 2018-05-19: 14:00:00 4 mg via INTRAVENOUS
  Filled 2018-05-19: qty 2

## 2018-05-19 MED ORDER — IOHEXOL 300 MG/ML  SOLN
100.0000 mL | Freq: Once | INTRAMUSCULAR | Status: AC | PRN
  Administered 2018-05-19: 100 mL via INTRAVENOUS

## 2018-05-19 MED ORDER — SODIUM CHLORIDE (PF) 0.9 % IJ SOLN
INTRAMUSCULAR | Status: AC
  Filled 2018-05-19: qty 50

## 2018-05-19 MED ORDER — VANCOMYCIN HCL 10 G IV SOLR
1500.0000 mg | Freq: Once | INTRAVENOUS | Status: AC
  Administered 2018-05-19: 1500 mg via INTRAVENOUS
  Filled 2018-05-19: qty 1500

## 2018-05-19 MED ORDER — SODIUM CHLORIDE 0.9 % IV BOLUS (SEPSIS)
1000.0000 mL | Freq: Once | INTRAVENOUS | Status: AC
  Administered 2018-05-19: 1000 mL via INTRAVENOUS

## 2018-05-19 MED ORDER — METRONIDAZOLE IN NACL 5-0.79 MG/ML-% IV SOLN
500.0000 mg | Freq: Once | INTRAVENOUS | Status: AC
  Administered 2018-05-19: 500 mg via INTRAVENOUS
  Filled 2018-05-19: qty 100

## 2018-05-19 MED ORDER — LEVOFLOXACIN 750 MG PO TABS: 750.0000 mg | ORAL_TABLET | Freq: Every day | ORAL | 0 refills | Status: AC

## 2018-05-19 MED ORDER — FENTANYL CITRATE (PF) 100 MCG/2ML IJ SOLN
50.0000 ug | Freq: Once | INTRAMUSCULAR | Status: AC
  Administered 2018-05-19: 50 ug via INTRAVENOUS
  Filled 2018-05-19: qty 2

## 2018-05-19 MED ORDER — VANCOMYCIN HCL IN DEXTROSE 1-5 GM/200ML-% IV SOLN: 1000.0000 mg | Freq: Once | INTRAVENOUS | Status: DC

## 2018-05-19 NOTE — Discharge Instructions (Addendum)
You have been seen today for hip and leg pain. Please read and follow all provided instructions. Return to the emergency room for worsening condition or new concerning symptoms.   1. Medications: Prescription has been sent to her pharmacy for Brogan.  This is an antibiotic you should take for pneumonia. Please take as prescribed. Continue usual home medications Take medications as prescribed. Please review all of the medicines and only take them if you do not have an allergy to them.   2. Treatment: rest, drink plenty of fluids.  3. Follow Up: Please follow up with your doctor tomorrow as you already have an appointment scheduled.  ?

## 2018-05-19 NOTE — ED Triage Notes (Signed)
Pt reports had fall last Tuesday and laid in floor for about 5 hours. Having pain in bilat hips and legs since fall. Reports taking MOrphine 50mg  every 4 hours but not helping

## 2018-05-19 NOTE — ED Notes (Signed)
During shift report previous nurse ([place], RN) stated pt refused POC occult blood test and Covid test. No documentation noted. I asked pt again about her wishes regarding the tests. Pt stated again that she does not consent to the tests.

## 2018-05-19 NOTE — Telephone Encounter (Signed)
Received after hours triage call notice. Patient took a fall from standing on Thursday last week. The pain has been constant. She is rating a 10/10 even with PRN Morphine q4 hours. Patient has very limited range of motion. Pain in her Right hip through to her groin, right inner pelvis. Telephone call to [name] (Patient's sister). Patient was advised to go to the ED. She is very hesitant due to [place]. Patient finally agreed to go to ED. Regulatory affairs officer at [employer] to advise arrival.

## 2018-05-19 NOTE — ED Notes (Signed)
Date and time results received: 05/19/18  12:57 PM   Test: hemoglobin  Critical Value: 6.3  Name of Provider Notified: Ralene Bathe MD  Orders Received? Or Actions Taken?: acknowledges result given

## 2018-05-19 NOTE — Progress Notes (Signed)
A consult was received from an ED provider for vancomycin and cefepime per pharmacy dosing.  The patient's profile has been reviewed for ht/wt/allergies/indication/available labs.  No antibiotic allergies. Wt = 65 kg.  A one time order has been placed for cefepime 2 g and vancomycin 1500 mg IV once.  Further antibiotics/pharmacy consults should be ordered by admitting physician if indicated.                       Thank you, Lenis Noon, PharmD 05/19/2018  11:54 AM

## 2018-05-19 NOTE — ED Provider Notes (Signed)
[facility] DEPT Provider Note   CSN: [id number] Arrival date & time: 05/19/18  1133    History   Chief Complaint Chief Complaint  Patient presents with  . Hip Pain  . Leg Pain    HPI Stacy Shelton is a 58 y.o. female who presents to the ED with cc of pain after fall. Patient fell on 05/13/2018. She has a PMH of metastatic Breast cancer to the bones, liver and brain. She is on Morphine 50 mg Q4H. She states that she has been in severe pain in her lumbar, hips, and legs for the since the fall. SHe is also having systemic myalgias. She has been having significant difficulty walking due to her pain. She was surprised to find out that she has a fever here today. She denies dark urine, dysuria, cough, abdominal pain, abdominal pain, N/V/D, chest pain or sob.     HPI  Past Medical History:  Diagnosis Date  . Asthma   . Diabetes ([place])   . Metastatic breast cancer ([place])   . Seasonal allergies     Patient Active Problem List   Diagnosis Date Noted  . Dysuria 05/15/2018  . Cancer associated pain 05/15/2018  . Physical debility 05/13/2018  . S/P thoracentesis   . Increased oxygen demand   . Tachypnea   . Hypoxia   . Chronic diastolic CHF (congestive heart failure) ([place]) 04/24/2018  . Anemia associated with chemotherapy 04/24/2018  . Chemotherapy-induced thrombocytopenia 04/24/2018  . Acute cystitis without hematuria   . Pneumonia due to infectious organism   . Malnutrition of moderate degree 04/09/2018  . Acute respiratory failure with hypoxia ([place])   . Septic shock ([place])   . Sepsis ([place]) 04/07/2018  . Hypokalemia 04/07/2018  . Hypomagnesemia 04/07/2018  . Acute lower UTI 04/07/2018  . Hypophosphatemia 04/07/2018  . Diarrhea 03/21/2018  . Metastatic breast cancer ([place]) 03/17/2018  . Metastasis to brain ([place]) 03/17/2018  . Metastasis to liver ([place]) 03/17/2018  . Metastasis to bone ([place]) 03/17/2018  . Pancytopenia, acquired ([place]) 03/17/2018  .  Goals of care, counseling/discussion 03/17/2018  . Encounter for antineoplastic chemotherapy 03/17/2018  . Absolute anemia 03/11/2018  . Thrombocytopenia ([place]) 03/11/2018  . Uncontrolled diabetes mellitus with hyperglycemia ([place]) 03/11/2018  . Moderate persistent asthma 03/11/2018    Past Surgical History:  Procedure Laterality Date  . IR IMAGING GUIDED PORT INSERTION  03/24/2018  . KIDNEY STONE SURGERY    . SINOSCOPY    . WISDOM TOOTH EXTRACTION       OB History   No obstetric history on file.      Home Medications    Prior to Admission medications   Medication Sig Start Date End Date Taking? Authorizing Provider  ACCU-CHEK AVIVA PLUS test strip  03/13/18   [provider]  ACCU-CHEK SOFTCLIX LANCETS lancets  03/13/18   [provider]  albuterol (PROAIR HFA) 108 (90 Base) MCG/ACT inhaler Inhale 2 puffs into the lungs every 6 (six) hours as needed for wheezing or shortness of breath. 05/16/17   Kennith Gain, MD  benzonatate (TESSALON) 200 MG capsule Take 1 capsule (200 mg total) by mouth 3 (three) times daily. 05/15/18   [name], MD  blood glucose meter kit and supplies KIT Dispense based on patient and insurance preference. Use up to four times daily as directed. (FOR ICD-9 250.00, 250.01). 03/13/18   Donne Hazel, MD  chlorpheniramine-HYDROcodone (TUSSIONEX) 10-8 MG/5ML SUER Take 5 mLs by mouth every 12 (twelve) hours.  05/06/18   Terrilee Croak, MD  cyclobenzaprine (FLEXERIL) 10 MG tablet Take 1 tablet (10 mg total) by mouth 2 (two) times daily as needed for muscle spasms. 05/06/18   Terrilee Croak, MD  dexamethasone (DECADRON) 4 MG tablet Take 1 tablet in the morning before chemo, none on the day of chemo and daily in the morning after chemo for 2 days, with food 03/21/18   [name], MD  esomeprazole (NEXIUM) 40 MG capsule Take 1 capsule (40 mg total) by mouth 2 (two) times daily before a meal. 05/16/17   Padgett, Rae Halsted, MD  fluticasone  (FLOVENT HFA) 44 MCG/ACT inhaler Inhale 2 puffs into the lungs 2 (two) times daily. 05/16/17   Kennith Gain, MD  insulin aspart (NOVOLOG) 100 UNIT/ML FlexPen Inject 8 Units into the skin 3 (three) times daily with meals. 03/13/18   Donne Hazel, MD  Insulin Detemir (LEVEMIR) 100 UNIT/ML Pen Inject 10 Units into the skin daily. 05/06/18   Dahal, Marlowe Aschoff, MD  Insulin Pen Needle 31G X 5 MM MISC 1 Device by Does not apply route QID. For use with insulin pens 03/13/18   Donne Hazel, MD  lidocaine-prilocaine (EMLA) cream Apply to affected area once Patient taking differently: Apply 1 application topically as needed (port access). Apply to affected area once 03/21/18   [name], MD  lip balm (CARMEX) ointment Apply topically as needed for lip care. 04/16/18   Purohit, Konrad Dolores, MD  loperamide (IMODIUM A-D) 2 MG tablet Take 1 tablet (2 mg total) by mouth 4 (four) times daily as needed for diarrhea or loose stools. 04/16/18   Purohit, Konrad Dolores, MD  LORazepam (ATIVAN) 0.5 MG tablet Take 1 tab po 30 minutes prior to radiation or MRI 03/14/18   Hayden Pedro, PA-C  metoprolol succinate (TOPROL-XL) 50 MG 24 hr tablet Take 50 mg by mouth every evening. 03/10/18   [provider]  montelukast (SINGULAIR) 10 MG tablet TAKE 1 TABLET BY MOUTH EVERYDAY AT BEDTIME Patient taking differently: Take 10 mg by mouth daily.  08/12/17   Kennith Gain, MD  morphine (MSIR) 15 MG tablet Take 1 tablet (15 mg total) by mouth every 4 (four) hours as needed for severe pain. 05/15/18   [name], MD  Multiple Vitamin (MULTIVITAMIN WITH MINERALS) TABS tablet Take 1 tablet by mouth daily. 05/07/18   Terrilee Croak, MD  traMADol (ULTRAM) 50 MG tablet Take 1 tablet (50 mg total) by mouth every 6 (six) hours as needed. 05/13/18   [name], MD    Family History Family History  Problem Relation Age of Onset  . Allergic rhinitis Neg Hx   . Angioedema Neg Hx   . Asthma Neg Hx   . Atopy Neg Hx   .  Eczema Neg Hx   . Immunodeficiency Neg Hx   . Urticaria Neg Hx     Social History Social History   Tobacco Use  . Smoking status: Never Smoker  . Smokeless tobacco: Never Used  Substance Use Topics  . Alcohol use: Yes    Alcohol/week: 0.0 standard drinks    Comment: socially  . Drug use: No     Allergies   Nsaids   Review of Systems Review of Systems  Ten systems reviewed and are negative for acute change, except as noted in the HPI.   Physical Exam Updated Vital Signs BP (!) 106/58 (BP Location: Left Arm)   Pulse (!) 135   Temp (!) 100.4 F (38  C) (Oral)   Resp 20   Ht _0  (1.575 m)   Wt 61.2 kg   LMP 10/30/2010   SpO2 100%   BMI 24.69 kg/m   Physical Exam Vitals signs and nursing note reviewed.  Constitutional:      General: She is not in acute distress.    Appearance: She is well-developed. She is not diaphoretic.  HENT:     Head: Normocephalic and atraumatic.  Eyes:     General: No scleral icterus.    Conjunctiva/sclera: Conjunctivae normal.  Neck:     Musculoskeletal: Normal range of motion.  Cardiovascular:     Rate and Rhythm: Normal rate and regular rhythm.     Heart sounds: Normal heart sounds. No murmur. No friction rub. No gallop.   Pulmonary:     Effort: Pulmonary effort is normal. No respiratory distress.     Breath sounds: Normal breath sounds.  Abdominal:     General: Bowel sounds are normal. There is no distension.     Palpations: Abdomen is soft. There is no mass.     Tenderness: There is no abdominal tenderness. There is no guarding.  Musculoskeletal:     Comments: Midline thoracic and lumbar tenderness No laxity of pelvis No bony tenderness in legs BL pitting edema and petheciae Weakness of both lower extremities, unable to lift left leg off bed- difficult to ascertain if this is true weakness versus deficit due to pain  Skin:    General: Skin is warm and dry.  Neurological:     Mental Status: She is alert and oriented to  person, place, and time.  Psychiatric:        Behavior: Behavior normal.      ED Treatments / Results  Labs (all labs ordered are listed, but only abnormal results are displayed) Labs Reviewed  CULTURE, BLOOD (ROUTINE X 2)  CULTURE, BLOOD (ROUTINE X 2)  URINE CULTURE  LACTIC ACID, PLASMA  LACTIC ACID, PLASMA  COMPREHENSIVE METABOLIC PANEL  CBC WITH DIFFERENTIAL/PLATELET  URINALYSIS, ROUTINE W REFLEX MICROSCOPIC  CK    EKG None  Radiology No results found.  Procedures Procedures (including critical care time)  Medications Ordered in ED Medications  ceFEPIme (MAXIPIME) 2 g in sodium chloride 0.9 % 100 mL IVPB (2 g Intravenous New Bag/Given 05/19/18 1221)  metroNIDAZOLE (FLAGYL) IVPB 500 mg (has no administration in time range)  sodium chloride 0.9 % bolus 1,000 mL (1,000 mLs Intravenous New Bag/Given 05/19/18 1212)    And  sodium chloride 0.9 % bolus 1,000 mL (has no administration in time range)  vancomycin (VANCOCIN) 1,500 mg in sodium chloride 0.9 % 500 mL IVPB (has no administration in time range)     Initial Impression / Assessment and Plan / ED Course  I have reviewed the triage vital signs and the nursing notes.  Pertinent labs & imaging results that were available during my care of the patient were reviewed by me and considered in my medical decision making (see chart for details).  Clinical Course as of May 18 1616  Mon May 19, 2018  1141 Temp(!): 100.4 F (38 C) [AH]  1141 Pulse Rate(!): 135 [AH]  1141 BP(!): 106/58 [AH]  1312 Sodium(!): 131 [AH]  1312 Hemoglobin(!!): 6.3 [AH]  1313 CK Total(!): 34 [AH]  1553 Patient is resistant to testing for COVID. She states She is already had COVID testing twice.  She has also refused guaiac testing. I have discussed the patient's need to be admitted and transfused.  The patient will need AMA sign out if she chooses to leave, however she will wait for her imaging.    [AH]    Clinical Course User Index [AH] Margarita Mail, PA-C       GP:QDIY, Back/hip/leg/pelvis pain VS:  Vitals:   05/19/18 1230 05/19/18 1245 05/19/18 1529 05/19/18 1848  BP: (!) 115/51  124/65 131/62  Pulse: (!) 113 (!) 116 (!) 120 (!) 125  Resp: (!) 28 (!) 28 (!) 24 (!) 25  Temp:      TempSrc:      SpO2: 100% 100% 100% 100%  Weight:      Height:       ME:BRAXENM is gathered by patient and review of EMR. Fever noted on arrival DDX: traumatic fracture, pathologic fracture, worsening metastatic disease, Sepsis, COVID-19, viral infection, neutropenic fever, polymyalgia rheumatica, rhabdomyolysis Labs: I reviewed the labs which show Acute anemia (2 g drop in the past six days), Leukocytosis, CK is not elevated,  thrombocytopenia, hyponatremia , mild hyperglycemia Imaging: I personally reviewed the images (CXR/ pelvic XR) which show(s) no acute abnormalities EKG:NA MHW:KGSUPJS here with fall, acute anemia, fever, tachycardia, low blood pressure, and leukocytosis. Concern for sepsis/ COVID-19. Fever source currently undetermined. Patient does not appear to have rhabdomyolysis. She has refused other testing and is resistant to admission.  Unsure of patient's source of sig change in hgb ? Marrow suppression, retroperitoneal bleed from fall, GI? Patient has agreed to complete her CT scans but is refusing admission. I have given sign out to [facility] and Dr. Vivi Martens. I had a lengthy discussion with the patient about her need for hospitalization and current risks (including death) both for herself and her community if she has COVID-19. Patient states that she has an appointment with Dr. Alvy Bimler tomorrow.     Final Clinical Impressions(s) / ED Diagnoses   Final diagnoses:  None    ED Discharge Orders    None       Margarita Mail, PA-C 05/20/18 3159    Quintella Reichert, MD 05/20/18 [phone]

## 2018-05-19 NOTE — ED Provider Notes (Signed)
Care assumed from A. Harris PA-C. Please see her full H&P.  In short,  Stacy Shelton is a 58 y.o. female presents for bilateral hip and leg pain after fall x 6 days ago. 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  On arrival pt was febrile and tachycardic. She has a WBC of 17.9, her hemoglobin is 6.3. This is a drop from x6 days ago when it was 8.8. Pt refused rectal exam to test for fecal occult bleeding for prior team. She would like to be discharged home, per pt she has Rosamaria Lints appointment to transfuse tomorrow. Pt did allow for further imaging to be done to look for etiologies of decreased hemoglobin. CT lumbar spine,  CT abdomen pelvis, and CT thoracic spine pending. RN notified me that pt refused coronavirus testing.  Physical Exam  BP 124/65   Pulse (!) 120   Temp (!) 100.4 F (38 C) (Oral)   Resp (!) 24   Ht 5\' 2"  (1.575 m)   Wt 61.2 kg   LMP 10/30/2010   SpO2 100%   BMI 24.69 kg/m   Physical Exam  PE: Constitutional:  no apparent distress. She is not diaphoretic. HENT: normocephalic, atraumatic Cardiovascular: Tachycardic Pulmonary/Chest: effort normal; breath sounds clear and equal bilaterally; no wheezes or rales Abdominal: soft and nontender Musculoskeletal: full ROM, no edema Neurological: alert with goal directed thinking Skin: warm and dry, no rash, no diaphoresis Psychiatric: normal mood and affect, normal behavior    ED Course/Procedures   Clinical Course as of May 19 1610  Mon May 19, 2018  1141 Temp(!): 100.4 F (38 C) [AH]  1141 Pulse Rate(!): 135 [AH]  1141 BP(!): 106/58 [AH]  1312 Sodium(!): 131 [AH]  1312 Hemoglobin(!!): 6.3 [AH]  1313 CK Total(!): 34 [AH]  1553 Patient is resistant to testing for COVID. She states She is already had COVID testing twice.  She has also refused guaiac testing. I have discussed the patient's need to be admitted and transfused. The patient will need AMA sign out if she chooses to leave,  however she will wait for her imaging.    [AH]    Clinical Course User Index [AH] Margarita Mail, PA-C   Results for orders placed or performed during the hospital encounter of 05/19/18 (from the past 24 hour(s))  Lactic acid, plasma     Status: None   Collection Time: 05/19/18 12:07 PM  Result Value Ref Range   Lactic Acid, Venous 0.9 0.5 - 1.9 mmol/L  Comprehensive metabolic panel     Status: Abnormal   Collection Time: 05/19/18 12:08 PM  Result Value Ref Range   Sodium 131 (L) 135 - 145 mmol/L   Potassium 3.9 3.5 - 5.1 mmol/L   Chloride 89 (L) 98 - 111 mmol/L   CO2 30 22 - 32 mmol/L   Glucose, Bld 114 (H) 70 - 99 mg/dL   BUN 24 (H) 6 - 20 mg/dL   Creatinine, Ser 0.72 0.44 - 1.00 mg/dL   Calcium 8.5 (L) 8.9 - 10.3 mg/dL   Total Protein 6.9 6.5 - 8.1 g/dL   Albumin 2.1 (L) 3.5 - 5.0 g/dL   AST 40 15 - 41 U/L   ALT 21 0 - 44 U/L   Alkaline Phosphatase 298 (H) 38 - 126 U/L   Total Bilirubin 1.5 (H) 0.3 - 1.2 mg/dL   GFR calc non Af Amer >60 >60 mL/min   GFR calc Af Amer >60 >60 mL/min  Anion gap 12 5 - 15  CBC WITH DIFFERENTIAL     Status: Abnormal   Collection Time: 05/19/18 12:08 PM  Result Value Ref Range   WBC 17.9 (H) 4.0 - 10.5 K/uL   RBC 2.27 (L) 3.87 - 5.11 MIL/uL   Hemoglobin 6.3 (LL) 12.0 - 15.0 g/dL   HCT 20.4 (L) 36.0 - 46.0 %   MCV 89.9 80.0 - 100.0 fL   MCH 27.8 26.0 - 34.0 pg   MCHC 30.9 30.0 - 36.0 g/dL   RDW 17.6 (H) 11.5 - 15.5 %   Platelets 35 (L) 150 - 400 K/uL   nRBC 0.0 0.0 - 0.2 %   Neutrophils Relative % 55 %   Lymphocytes Relative 11 %   Monocytes Relative 2 %   Eosinophils Relative 1 %   Basophils Relative 6 %   Band Neutrophils 5 %   Metamyelocytes Relative 10 %   Myelocytes 3 %   Promyelocytes Relative 1 %   Blasts 6 %   nRBC 0 0 /100 WBC   Other 0 %   Neutro Abs 13.2 (H) 1.7 - 7.7 K/uL   Lymphs Abs 2.0 0.7 - 4.0 K/uL   Monocytes Absolute 0.4 0.1 - 1.0 K/uL   Eosinophils Absolute 0.2 0.0 - 0.5 K/uL   Basophils Absolute 1.1 (H)  0.0 - 0.1 K/uL   RBC Morphology ELLIPTOCYTES    WBC Morphology VACUOLATED NEUTROPHILS   CK     Status: Abnormal   Collection Time: 05/19/18 12:08 PM  Result Value Ref Range   Total CK 34 (L) 38 - 234 U/L    MDM   I viewed pt's CT scans. Thoracic and lumbar spine show possible pneumonia vs tumor. Ct abdomen pelvis shows possible hematoma vs severe muscular injury of right iliacus muscle. I discussed the results with the patient and recommend she be admitted to th e hospital. She continues to request to be discharged and will sign out AMA. Will send prescription for Levaquin for pneumonia to her pharmacy. Pt has an appointment tomorrow with Dr. Alvy Bimler for labwork and possible transfusion. Discussed the importance of the guaiac testing given her drop in hemoglobin from 8.8 to 6.3 x 6 days ago. I rechecked her temperature and it is 98.4 orally. She continues to be tachycardic but declines admission.  I have discussed my concerns as pt's  provider and the possibility that this may worsen. I have specifically discussed that without further evaluation I cannot guarantee there is not a life threatening event occuring.  Pt is A&Ox4, her own POA and states understanding of my concerns and the possible consequences.  I have made pt aware that this is an [place] discharge, but they may return at any time for further evaluation and treatment. Pt case discussed with Dr. Zenia Resides who agrees with my plan.   This note was prepared with assistance of Systems analyst. Occasional wrong-word or sound-a-like substitutions may have occurred due to the inherent limitations of voice recognition software.    Flint Melter 05/20/18 0006    Lacretia Leigh, MD 05/21/18 [phone]

## 2018-05-20 ENCOUNTER — Other Ambulatory Visit: Payer: Self-pay | Admitting: Hematology and Oncology

## 2018-05-20 ENCOUNTER — Other Ambulatory Visit: Payer: Self-pay

## 2018-05-20 ENCOUNTER — Other Ambulatory Visit: Payer: BLUE CROSS/BLUE SHIELD

## 2018-05-20 ENCOUNTER — Inpatient Hospital Stay: Payer: BLUE CROSS/BLUE SHIELD

## 2018-05-20 ENCOUNTER — Other Ambulatory Visit: Payer: Self-pay | Admitting: Emergency Medicine

## 2018-05-20 ENCOUNTER — Inpatient Hospital Stay (HOSPITAL_BASED_OUTPATIENT_CLINIC_OR_DEPARTMENT_OTHER): Payer: BLUE CROSS/BLUE SHIELD | Admitting: Hematology and Oncology

## 2018-05-20 ENCOUNTER — Encounter: Payer: Self-pay | Admitting: Hematology and Oncology

## 2018-05-20 DIAGNOSIS — C50312 Malignant neoplasm of lower-inner quadrant of left female breast: Secondary | ICD-10-CM

## 2018-05-20 DIAGNOSIS — K5909 Other constipation: Secondary | ICD-10-CM | POA: Insufficient documentation

## 2018-05-20 DIAGNOSIS — Z794 Long term (current) use of insulin: Secondary | ICD-10-CM

## 2018-05-20 DIAGNOSIS — D61818 Other pancytopenia: Secondary | ICD-10-CM

## 2018-05-20 DIAGNOSIS — Z171 Estrogen receptor negative status [ER-]: Secondary | ICD-10-CM

## 2018-05-20 DIAGNOSIS — C50919 Malignant neoplasm of unspecified site of unspecified female breast: Secondary | ICD-10-CM

## 2018-05-20 DIAGNOSIS — Z5112 Encounter for antineoplastic immunotherapy: Secondary | ICD-10-CM | POA: Diagnosis not present

## 2018-05-20 DIAGNOSIS — D649 Anemia, unspecified: Secondary | ICD-10-CM

## 2018-05-20 DIAGNOSIS — C7802 Secondary malignant neoplasm of left lung: Secondary | ICD-10-CM | POA: Diagnosis not present

## 2018-05-20 DIAGNOSIS — C787 Secondary malignant neoplasm of liver and intrahepatic bile duct: Secondary | ICD-10-CM

## 2018-05-20 DIAGNOSIS — K661 Hemoperitoneum: Secondary | ICD-10-CM

## 2018-05-20 DIAGNOSIS — M79605 Pain in left leg: Secondary | ICD-10-CM

## 2018-05-20 DIAGNOSIS — Z9181 History of falling: Secondary | ICD-10-CM

## 2018-05-20 DIAGNOSIS — E440 Moderate protein-calorie malnutrition: Secondary | ICD-10-CM

## 2018-05-20 DIAGNOSIS — R600 Localized edema: Secondary | ICD-10-CM

## 2018-05-20 DIAGNOSIS — Z9981 Dependence on supplemental oxygen: Secondary | ICD-10-CM

## 2018-05-20 DIAGNOSIS — Z886 Allergy status to analgesic agent status: Secondary | ICD-10-CM

## 2018-05-20 DIAGNOSIS — R300 Dysuria: Secondary | ICD-10-CM

## 2018-05-20 DIAGNOSIS — C7951 Secondary malignant neoplasm of bone: Secondary | ICD-10-CM

## 2018-05-20 DIAGNOSIS — D6189 Other specified aplastic anemias and other bone marrow failure syndromes: Secondary | ICD-10-CM

## 2018-05-20 DIAGNOSIS — C7931 Secondary malignant neoplasm of brain: Secondary | ICD-10-CM

## 2018-05-20 DIAGNOSIS — M79604 Pain in right leg: Secondary | ICD-10-CM

## 2018-05-20 DIAGNOSIS — S36892A Contusion of other intra-abdominal organs, initial encounter: Secondary | ICD-10-CM

## 2018-05-20 DIAGNOSIS — E1165 Type 2 diabetes mellitus with hyperglycemia: Secondary | ICD-10-CM

## 2018-05-20 DIAGNOSIS — I700 Atherosclerosis of aorta: Secondary | ICD-10-CM

## 2018-05-20 DIAGNOSIS — M549 Dorsalgia, unspecified: Secondary | ICD-10-CM

## 2018-05-20 DIAGNOSIS — C7800 Secondary malignant neoplasm of unspecified lung: Secondary | ICD-10-CM | POA: Insufficient documentation

## 2018-05-20 DIAGNOSIS — C7801 Secondary malignant neoplasm of right lung: Secondary | ICD-10-CM

## 2018-05-20 LAB — CBC WITH DIFFERENTIAL (CANCER CENTER ONLY)
Abs Immature Granulocytes: 3.98 10*3/uL — ABNORMAL HIGH (ref 0.00–0.07)
Basophils Absolute: 1.1 10*3/uL — ABNORMAL HIGH (ref 0.0–0.1)
Basophils Relative: 5 %
Eosinophils Absolute: 0 10*3/uL (ref 0.0–0.5)
Eosinophils Relative: 0 %
HCT: 22.2 % — ABNORMAL LOW (ref 36.0–46.0)
Hemoglobin: 6.8 g/dL — CL (ref 12.0–15.0)
Immature Granulocytes: 20 %
Lymphocytes Relative: 12 %
Lymphs Abs: 2.4 10*3/uL (ref 0.7–4.0)
MCH: 27.5 pg (ref 26.0–34.0)
MCHC: 30.6 g/dL (ref 30.0–36.0)
MCV: 89.9 fL (ref 80.0–100.0)
Monocytes Absolute: 0.7 10*3/uL (ref 0.1–1.0)
Monocytes Relative: 4 %
Neutro Abs: 11.5 10*3/uL — ABNORMAL HIGH (ref 1.7–7.7)
Neutrophils Relative %: 59 %
Platelet Count: 40 10*3/uL — ABNORMAL LOW (ref 150–400)
RBC: 2.47 MIL/uL — ABNORMAL LOW (ref 3.87–5.11)
RDW: 17.5 % — ABNORMAL HIGH (ref 11.5–15.5)
WBC Count: 19.6 10*3/uL — ABNORMAL HIGH (ref 4.0–10.5)
nRBC: 0.1 % (ref 0.0–0.2)

## 2018-05-20 LAB — URINALYSIS, COMPLETE (UACMP) WITH MICROSCOPIC
Bacteria, UA: NONE SEEN
Bilirubin Urine: NEGATIVE
Glucose, UA: NEGATIVE mg/dL
Hgb urine dipstick: NEGATIVE
Ketones, ur: 5 mg/dL — AB
Nitrite: NEGATIVE
Protein, ur: NEGATIVE mg/dL
Specific Gravity, Urine: 1.023 (ref 1.005–1.030)
WBC, UA: >50 WBC/hpf — ABNORMAL HIGH (ref 0–5)
pH: 5 (ref 5.0–8.0)

## 2018-05-20 LAB — SAMPLE TO BLOOD BANK

## 2018-05-20 LAB — CMP (CANCER CENTER ONLY)
ALT: 18 U/L (ref 0–44)
AST: 31 U/L (ref 15–41)
Albumin: 1.9 g/dL — ABNORMAL LOW (ref 3.5–5.0)
Alkaline Phosphatase: 290 U/L — ABNORMAL HIGH (ref 38–126)
Anion gap: 13 (ref 5–15)
BUN: 16 mg/dL (ref 6–20)
CO2: 27 mmol/L (ref 22–32)
Calcium: 8.8 mg/dL — ABNORMAL LOW (ref 8.9–10.3)
Chloride: 96 mmol/L — ABNORMAL LOW (ref 98–111)
Creatinine: 0.68 mg/dL (ref 0.44–1.00)
GFR, Est AFR Am: >60 mL/min (ref >60)
GFR, Estimated: >60 mL/min (ref >60)
Glucose, Bld: 109 mg/dL — ABNORMAL HIGH (ref 70–99)
Potassium: 4.1 mmol/L (ref 3.5–5.1)
Sodium: 136 mmol/L (ref 135–145)
Total Bilirubin: 1 mg/dL (ref 0.3–1.2)
Total Protein: 7.2 g/dL (ref 6.5–8.1)

## 2018-05-20 LAB — HEMOGLOBIN AND HEMATOCRIT (CANCER CENTER ONLY)
HCT: 21.5 % — ABNORMAL LOW (ref 36.0–46.0)
Hemoglobin: 6.7 g/dL — CL (ref 12.0–15.0)

## 2018-05-20 LAB — ABO/RH: ABO/RH(D): A POS

## 2018-05-20 LAB — PREPARE RBC (CROSSMATCH)

## 2018-05-20 MED ORDER — DIPHENHYDRAMINE HCL 25 MG PO CAPS
ORAL_CAPSULE | ORAL | Status: AC
  Filled 2018-05-20: qty 1

## 2018-05-20 MED ORDER — HEPARIN SOD (PORK) LOCK FLUSH 100 UNIT/ML IV SOLN
250.0000 [IU] | INTRAVENOUS | Status: AC | PRN
  Filled 2018-05-20: qty 5

## 2018-05-20 MED ORDER — MORPHINE SULFATE 15 MG PO TABS: 15.0000 mg | ORAL_TABLET | ORAL | 0 refills | Status: DC | PRN

## 2018-05-20 MED ORDER — ACETAMINOPHEN 325 MG PO TABS
ORAL_TABLET | ORAL | Status: AC
  Filled 2018-05-20: qty 2

## 2018-05-20 MED ORDER — SODIUM CHLORIDE 0.9% FLUSH
10.0000 mL | INTRAVENOUS | Status: AC | PRN
  Administered 2018-05-20: 10 mL
  Filled 2018-05-20: qty 10

## 2018-05-20 MED ORDER — ACETAMINOPHEN 325 MG PO TABS
650.0000 mg | ORAL_TABLET | Freq: Once | ORAL | Status: AC
  Administered 2018-05-20: 650 mg via ORAL

## 2018-05-20 MED ORDER — HEPARIN SOD (PORK) LOCK FLUSH 100 UNIT/ML IV SOLN
500.0000 [IU] | Freq: Every day | INTRAVENOUS | Status: AC | PRN
  Administered 2018-05-20: 500 [IU]
  Filled 2018-05-20: qty 5

## 2018-05-20 MED ORDER — SODIUM CHLORIDE 0.9% IV SOLUTION
250.0000 mL | Freq: Once | INTRAVENOUS | Status: AC
  Administered 2018-05-20: 11:00:00 250 mL via INTRAVENOUS
  Filled 2018-05-20: qty 250

## 2018-05-20 MED ORDER — SODIUM CHLORIDE 0.9% FLUSH
3.0000 mL | INTRAVENOUS | Status: AC | PRN
  Filled 2018-05-20: qty 10

## 2018-05-20 MED ORDER — SODIUM CHLORIDE 0.9% IV SOLUTION
250.0000 mL | Freq: Once | INTRAVENOUS | Status: AC
  Administered 2018-05-20: 15:00:00 250 mL via INTRAVENOUS
  Filled 2018-05-20: qty 250

## 2018-05-20 MED ORDER — DIPHENHYDRAMINE HCL 25 MG PO CAPS
25.0000 mg | ORAL_CAPSULE | Freq: Once | ORAL | Status: AC
  Administered 2018-05-20: 11:00:00 25 mg via ORAL

## 2018-05-20 NOTE — Assessment & Plan Note (Signed)
Her liver enzymes are improving and the liver metastasis is smaller Observe for now

## 2018-05-20 NOTE — Assessment & Plan Note (Signed)
We have extensive discussion about aggressive laxative therapy

## 2018-05-20 NOTE — Progress Notes (Unsigned)
Md Alvy Bimler came to [facility] for pt's visit during transfusion, spoke with pt and her sister over the phone.  1 unit given RBCs.  VO to draw H&H 1hour after 1st unit ends to see if pt needs another unit.  Second unit ordered based on new H&H values.  VSS.  Report given to [facility], pt transferred via [facility] with belongings and new home oxygen tank to infusion Bed 1.

## 2018-05-20 NOTE — Assessment & Plan Note (Signed)
Her blood sugar level is mildly elevated We will continue to adjust the insulin as needed

## 2018-05-20 NOTE — Patient Instructions (Signed)
Blood Transfusion, Adult, Care After This sheet gives you information about how to care for yourself after your procedure. Your doctor may also give you more specific instructions. If you have problems or questions, contact your doctor. Follow these instructions at home:   Take over-the-counter and prescription medicines only as told by your doctor.  Go back to your normal activities as told by your doctor.  Follow instructions from your doctor about how to take care of the area where an IV tube was put into your vein (insertion site). Make sure you: ? Wash your hands with soap and water before you change your bandage (dressing). If there is no soap and water, use hand sanitizer. ? Change your bandage as told by your doctor.  Check your IV insertion site every day for signs of infection. Check for: ? More redness, swelling, or pain. ? More fluid or blood. ? Warmth. ? Pus or a bad smell. Contact a doctor if:  You have more redness, swelling, or pain around the IV insertion site.  You have more fluid or blood coming from the IV insertion site.  Your IV insertion site feels warm to the touch.  You have pus or a bad smell coming from the IV insertion site.  Your pee (urine) turns pink, red, or brown.  You feel weak after doing your normal activities. Get help right away if:  You have signs of a serious allergic or body defense (immune) system reaction, including: ? Itchiness. ? Hives. ? Trouble breathing. ? Anxiety. ? Pain in your chest or lower back. ? Fever, flushing, and chills. ? Fast pulse. ? Rash. ? Watery poop (diarrhea). ? Throwing up (vomiting). ? Dark pee. ? Serious headache. ? Dizziness. ? Stiff neck. ? Yellow color in your face or the white parts of your eyes (jaundice). Summary  After a blood transfusion, return to your normal activities as told by your doctor.  Every day, check for signs of infection where the IV tube was put into your vein.  Some  signs of infection are warm skin, more redness and pain, more fluid or blood, and pus or a bad smell where the needle went in.  Contact your doctor if you feel weak or have any unusual symptoms. This information is not intended to replace advice given to you by your health care provider. Make sure you discuss any questions you have with your health care provider. Document Released: 02/05/2014 Document Revised: 09/09/2015 Document Reviewed: 09/09/2015 Elsevier Interactive Patient Education  2019 [employer]  

## 2018-05-20 NOTE — Assessment & Plan Note (Signed)
The cause of her severe pancytopenia is due to retroperitoneal hematoma from her recent fall, on the background of bone marrow involvement She received a unit of blood transfusion today Subsequent repeat hemoglobin is still low and she will receive her second unit of blood She will return again at the end of the week for repeat CBC check The plan is to get her hemoglobin above 7 She does not need platelet transfusion

## 2018-05-20 NOTE — Assessment & Plan Note (Signed)
I reviewed imaging study Her back pain is due to retroperitoneal hematoma We discussed pain management

## 2018-05-20 NOTE — Assessment & Plan Note (Signed)
She had recent symptoms of dysuria I have ordered urinalysis and urine culture to exclude UTI

## 2018-05-20 NOTE — Assessment & Plan Note (Signed)
She has known bone metastasis and likely bone marrow involvement causing severe pancytopenia We will continue transfusion support for now

## 2018-05-20 NOTE — Assessment & Plan Note (Signed)
She has moderate to severe protein calorie malnutrition We discussed calorie count with goal to at least maintain her weight

## 2018-05-20 NOTE — Progress Notes (Signed)
[place] OFFICE PROGRESS NOTE  Patient Care Team: Robyne Peers, MD as PCP - General (Family Medicine)  ASSESSMENT & PLAN:  Metastatic breast cancer [facility]) I have reviewed her blood work and imaging studies with the patient and her sister over the telephone With her permission, I have also taken some comparable images of her most recent CT scan yesterday and her CT from March Her blood work show minimum improvement of liver enzymes but CT imaging show marked improvement She has only received 1 dose of chemotherapy and her subsequent treatment was delayed due to severe, recurrent pancytopenia. Her overall performance status score is very poor She is not ready to resume chemotherapy yet I plan to see her weekly for aggressive supportive care.  Metastasis to bone [facility]) She has known bone metastasis and likely bone marrow involvement causing severe pancytopenia We will continue transfusion support for now  Pancytopenia, acquired [facility]) The cause of her severe pancytopenia is due to retroperitoneal hematoma from her recent fall, on the background of bone marrow involvement She received a unit of blood transfusion today Subsequent repeat hemoglobin is still low and she will receive her second unit of blood She will return again at the end of the week for repeat CBC check The plan is to get her hemoglobin above 7 She does not need platelet transfusion  Metastasis to liver [facility]) Her liver enzymes are improving and the liver metastasis is smaller Observe for now  Malnutrition of moderate degree She has moderate to severe protein calorie malnutrition We discussed calorie count with goal to at least maintain her weight  Uncontrolled diabetes mellitus with hyperglycemia ([place]) Her blood sugar level is mildly elevated We will continue to adjust the insulin as needed  Dysuria She had recent symptoms of dysuria I have ordered urinalysis and urine culture to exclude  UTI  Retroperitoneal hematoma I reviewed imaging study Her back pain is due to retroperitoneal hematoma We discussed pain management  Other constipation We have extensive discussion about aggressive laxative therapy  Metastasis to lung [facility]) Her lung nodule is stable Bilateral pulmonary infiltrate has improved dramatically Her oxygen requirement is improving and she has less cough   Orders Placed This Encounter  Procedures  . Type and screen    Standing Status:   Future    Number of Occurrences:   1    Standing Expiration Date:   05/20/2019  . Prepare RBC    Standing Status:   Standing    Number of Occurrences:   1    Order Specific Question:   # of Units    Answer:   1 unit    Order Specific Question:   Transfusion Indications    Answer:   Symptomatic Anemia    Order Specific Question:   Special Requirements    Answer:   Irradiated    Order Specific Question:   If emergent release call blood bank    Answer:   Not emergent release    INTERVAL HISTORY: Please see below for problem oriented charting. She is seen in the clinic for further follow-up Last week, she had a fall, complicated by severe lower back pain She was prescribed immediate release morphine but it was not alleviating her pain Yesterday, she called the office with 10 out of 10 pain.  She was referred to the emergency department She had blood work done along with CT imaging.  The patient declined admission or transfusion and was discharged She was prescribed levofloxacin  for possible infection The patient complained of severe constipation since she started taking IR morphine She denies nausea Her appetite remains poor She has lost more weight since last time I saw her Her blood sugar typically runs around 200 Today, with positioning, she has no pain in her back; when she had pain, her pain was mostly on the lower back more so on the right Her cough is almost completely resolved.  She is still oxygen  dependent She denies fever or chills The patient denies any recent signs or symptoms of bleeding such as spontaneous epistaxis, hematuria or hematochezia.  SUMMARY OF ONCOLOGIC HISTORY: Oncology History   Biopsy is ER negative Her2/neu positive     Metastatic breast cancer ([place])   03/11/2018 Imaging    Ct scan of chest, abdomen and pelvis 1. 2.6 x 2.3 cm spiculated mass identified inferomedial quadrant of the left breast. Lesion appears to retract the overlying skin. Mammographic correlation recommended. 2. Small lymph nodes in the left axillar ill-defined and suspicious for metastatic disease. 3. Multiple bilateral pulmonary nodules measuring up to 2.7 cm diameter. These probably represent metastatic disease. Given the dominant size of the central right upper lobe lesion, synchronous lung primary can not be completely excluded. 4. Multiple ill-defined liver lesions compatible with metastatic disease. Dominant liver metastases measure up to almost 4 cm. 5.  Aortic Atherosclerois (ICD10-170.0)     03/12/2018 Pathology Results    Liver, needle/core biopsy, left lobe - METASTATIC CARCINOMA. - LYMPHOVASCULAR INVASION IS IDENTIFIED. - SEE COMMENT. Microscopic Comment The tumor cells are positive for cytokeratin 7. There is faint staining for GATA-3. There is non-specific staining for TTF-1. Cytokeratin 20, CDX-2, estrogen receptor, and GCDFP stains are negative. Given the clinical suspicion, the profile supports a primary breast carcinoma. Her2 will be performed and the results reported separately.  By immunohistochemistry, the tumor cells are POSITIVE for Her2 (3+).    03/12/2018 Imaging    Single 1.3 x 1.4 cm RIGHT occipital metastasis.  Borderline pachymeningeal enhancement of symmetric nature could be related to the superficial metastasis or osseous disease.    03/12/2018 Procedure    Ultrasound-guided core biopsy performed of a mass within the lateral segment of the left lobe of the  liver.    03/16/2018 Imaging    Right occipital lobe 13 x 16 mm. Small satellite enhancing nodule deep to the larger lesion. The larger lesion shows central necrosis and probable mild hemorrhage or calcification.  Mild dural enhancement is less impressive and could be within normal limits.  Bone marrow in the clivus and cervical spine is diffusely low signal. No focal lesion. This may be related to the patient's anemia and abnormal blood count.    03/17/2018 Cancer Staging    Staging form: Breast, AJCC 8th Edition - Clinical: Stage IV (cT2, cN0, pM1, GX, ER-, PR: Not Assessed, HER2+) - Signed by [name], MD on 03/17/2018    03/19/2018 -  Chemotherapy    She received chemo with Taxotere, Herceptin and Perjeta    03/21/2018 Echocardiogram    IMPRESSIONS 1. The left ventricle has normal systolic function with an ejection fraction of 60-65%. The cavity size was normal. Left ventricular diastolic Doppler parameters are consistent with impaired relaxation.  2. The right ventricle has normal systolic function. The cavity was normal. There is no increase in right ventricular wall thickness.  3. The mitral valve is normal in structure.  4. The tricuspid valve is normal in structure.  5. Strain imaging performed but not  reported due to interpreter judgement, secondary to suboptimal image quality.    03/24/2018 Procedure    Successful placement of a right IJ approach Power Port with ultrasound and fluoroscopic guidance. The catheter is ready for use.    04/07/2018 - 04/16/2018 Hospital Admission    She was admitted for pneumonia    04/07/2018 - 04/16/2018 Hospital Admission    She was admitted to the hospital for pneumonia and respiratory failure    04/25/2018 Imaging    Retropharyngeal fluid collection compatible with effusion or possibly abscess. Soft tissue thickening extends into the right neck and surrounds the right carotid bifurcation and right internal carotid artery. There is also streaky  density in the right lateral neck soft tissues with a focal 7.6 Mm lymph node in the right lateral neck. Stranding surrounds the right submandibular gland.   Findings are most compatible with infection. Favor pharyngitis with retropharyngeal effusion/abscess. Soft tissue thickening around the right carotid most compatible with infection rather than tumor. Right jugular vein is patent.  Multiple nodular densities in the right upper lobe may represent residual pneumonia or metastatic disease. Interval improvement in right upper lobe infiltrate compared with CT of 04/08/2018    04/25/2018 - 05/06/2018 Hospital Admission    She was admitted for retropharyngeal abscess    04/27/2018 Imaging    No evidence of large central pulmonary embolus is noted in the main pulmonary artery or the main portions of the right and left pulmonary arteries. However, evaluation of the lower lobe branches, particularly on the right, is limited due to respiratory motion artifact and other limiting issues. Pulmonary emboli and smaller peripheral branches can not be excluded on the basis of this exam.  Large left pleural effusion is noted with complete atelectasis of the left lower lobe. Increased left upper lobe opacity is noted posteriorly concerning for atelectasis or pneumonia.  Improved right upper lobe and lower lobe opacities are noted suggesting improving pneumonia or atelectasis.  15 mm right paratracheal lymph node is noted which is enlarged compared to prior exam; it is uncertain if this is metastatic or inflammatory in etiology.  Hepatic metastatic lesions are again noted.  Aortic Atherosclerosis (ICD10-I70.0).     04/27/2018 Procedure    Successful ultrasound guided left thoracentesis yielding 120 mL of blood-tinged of pleural fluid.     Metastasis to brain ([place])   03/17/2018 Initial Diagnosis    Metastasis to brain [facility])     Metastasis to liver ([place])   03/17/2018 Initial Diagnosis    Metastasis  to liver [facility])     Metastasis to bone ([place])   03/17/2018 Initial Diagnosis    Metastasis to bone ([facility])     REVIEW OF SYSTEMS:   Constitutional: Denies fevers, chills Eyes: Denies blurriness of vision Ears, nose, mouth, throat, and face: Denies mucositis or sore throat Cardiovascular: Denies palpitation, chest discomfort  Skin: Denies abnormal skin rashes Lymphatics: Denies new lymphadenopathy  Neurological:Denies numbness, tingling or new weaknesses Behavioral/Psych: Mood is stable, no new changes  All other systems were reviewed with the patient and are negative.  I have reviewed the past medical history, past surgical history, social history and family history with the patient and they are unchanged from previous note.  ALLERGIES:  is allergic to nsaids.  MEDICATIONS:  Current Outpatient Medications  Medication Sig Dispense Refill  . ACCU-CHEK AVIVA PLUS test strip     . ACCU-CHEK SOFTCLIX LANCETS lancets     . albuterol (PROAIR HFA) 108 (90 Base) MCG/ACT inhaler Inhale  2 puffs into the lungs every 6 (six) hours as needed for wheezing or shortness of breath. 1 Inhaler 1  . benzonatate (TESSALON) 100 MG capsule Take 100 mg by mouth 3 (three) times daily as needed for cough.     . blood glucose meter kit and supplies KIT Dispense based on patient and insurance preference. Use up to four times daily as directed. (FOR ICD-9 250.00, 250.01). 1 each 0  . chlorpheniramine-HYDROcodone (TUSSIONEX) 10-8 MG/5ML SUER Take 5 mLs by mouth every 12 (twelve) hours. (Patient taking differently: Take 5 mLs by mouth every 12 (twelve) hours as needed for cough. ) 140 mL 0  . dexamethasone (DECADRON) 4 MG tablet Take 1 tablet in the morning before chemo, none on the day of chemo and daily in the morning after chemo for 2 days, with food (Patient taking differently: Take 4 mg by mouth See admin instructions. Take 4 mg in the morning before chemo, none on the day of chemo and daily in the morning after  chemo for 2 days with food) 30 tablet 1  . esomeprazole (NEXIUM) 40 MG capsule Take 1 capsule (40 mg total) by mouth 2 (two) times daily before a meal. 180 capsule 1  . fluticasone (FLOVENT HFA) 44 MCG/ACT inhaler Inhale 2 puffs into the lungs 2 (two) times daily. 3 Inhaler 1  . insulin aspart (NOVOLOG) 100 UNIT/ML FlexPen Inject 8 Units into the skin 3 (three) times daily with meals. 15 mL 0  . Insulin Detemir (LEVEMIR) 100 UNIT/ML Pen Inject 10 Units into the skin daily. (Patient taking differently: Inject 10 Units into the skin at bedtime. ) 15 mL 0  . Insulin Pen Needle 31G X 5 MM MISC 1 Device by Does not apply route QID. For use with insulin pens 100 each 0  . levofloxacin (LEVAQUIN) 750 MG tablet Take 1 tablet (750 mg total) by mouth daily for 5 days. 5 tablet 0  . lidocaine-prilocaine (EMLA) cream Apply to affected area once (Patient taking differently: Apply 1 application topically as needed (port access). Apply to affected area once) 30 g 3  . LORazepam (ATIVAN) 0.5 MG tablet Take 1 tab po 30 minutes prior to radiation or MRI (Patient taking differently: Take 0.5 mg by mouth See admin instructions. Take 30 minutes prior to radiation or MRI) 30 tablet 0  . metoprolol succinate (TOPROL-XL) 50 MG 24 hr tablet Take 50 mg by mouth every evening.    . montelukast (SINGULAIR) 10 MG tablet TAKE 1 TABLET BY MOUTH EVERYDAY AT BEDTIME (Patient taking differently: Take 10 mg by mouth daily. ) 90 tablet 0  . morphine (MSIR) 15 MG tablet Take 1 tablet (15 mg total) by mouth every 4 (four) hours as needed for severe pain. 60 tablet 0  . Multiple Vitamin (MULTIVITAMIN WITH MINERALS) TABS tablet Take 1 tablet by mouth daily.     No current facility-administered medications for this visit.    Facility-Administered Medications Ordered in Other Visits  Medication Dose Route Frequency Provider Last Rate Last Dose  . heparin lock flush 100 unit/mL  250 Units Intracatheter PRN Alvy Bimler, Merica Prell, MD      . heparin  lock flush 100 unit/mL  500 Units Intracatheter Daily PRN Alvy Bimler, Daniil Labarge, MD      . sodium chloride flush (NS) 0.9 % injection 10 mL  10 mL Intracatheter PRN Maverick Dieudonne, MD      . sodium chloride flush (NS) 0.9 % injection 3 mL  3 mL Intracatheter PRN Janda Cargo,  MD        PHYSICAL EXAMINATION: ECOG PERFORMANCE STATUS: 3 - Symptomatic, >50% confined to bed GENERAL:alert, no distress and comfortable.  She looks pale SKIN: Noted skin bruises.  She looks pale  eYES: normal, Conjunctiva are pale and non-injected, sclera clear OROPHARYNX:no exudate, no erythema and lips, buccal mucosa, and tongue normal  NECK: supple, thyroid normal size, non-tender, without nodularity LYMPH:  no palpable lymphadenopathy in the cervical, axillary or inguinal LUNGS: clear to auscultation and percussion with normal breathing effort HEART: regular rate & rhythm and no murmurs with mild to moderate lower extremity edema ABDOMEN:abdomen soft, non-tender and normal bowel sounds Musculoskeletal:no cyanosis of digits and no clubbing  NEURO: alert & oriented x 3 with fluent speech, no focal motor/sensory deficits  LABORATORY DATA:  I have reviewed the data as listed    Component Value Date/Time   NA 136 05/20/2018 0922   K 4.1 05/20/2018 0922   CL 96 (L) 05/20/2018 0922   CO2 27 05/20/2018 0922   GLUCOSE 109 (H) 05/20/2018 0922   BUN 16 05/20/2018 0922   CREATININE 0.68 05/20/2018 0922   CALCIUM 8.8 (L) 05/20/2018 0922   PROT 7.2 05/20/2018 0922   ALBUMIN 1.9 (L) 05/20/2018 0922   AST 31 05/20/2018 0922   ALT 18 05/20/2018 0922   ALKPHOS 290 (H) 05/20/2018 0922   BILITOT 1.0 05/20/2018 0922   GFRNONAA >60 05/20/2018 0922   GFRAA >60 05/20/2018 0922    No results found for: SPEP, UPEP  Lab Results  Component Value Date   WBC 19.6 (H) 05/20/2018   NEUTROABS 11.5 (H) 05/20/2018   HGB 6.7 (LL) 05/20/2018   HCT 21.5 (L) 05/20/2018   MCV 89.9 05/20/2018   PLT 40 (L) 05/20/2018      Chemistry       Component Value Date/Time   NA 136 05/20/2018 0922   K 4.1 05/20/2018 0922   CL 96 (L) 05/20/2018 0922   CO2 27 05/20/2018 0922   BUN 16 05/20/2018 0922   CREATININE 0.68 05/20/2018 0922      Component Value Date/Time   CALCIUM 8.8 (L) 05/20/2018 0922   ALKPHOS 290 (H) 05/20/2018 0922   AST 31 05/20/2018 0922   ALT 18 05/20/2018 0922   BILITOT 1.0 05/20/2018 0922       RADIOGRAPHIC STUDIES: I have reviewed her latest CT scan with the patient and her sister over the telephone I have personally reviewed the radiological images as listed and agreed with the findings in the report. Dg Chest 1 View  Result Date: 04/29/2018 CLINICAL DATA:  Tachypnea EXAM: CHEST  1 VIEW COMPARISON:  Chest CT from 2 days ago FINDINGS: There is small left pleural effusion and multifocal consolidation, particularly dense in the left lower lobe by prior CT. No superimposed Kerley lines. No pneumothorax. Stable heart size. Porta catheter on the right with tip at the SVC. IMPRESSION: Multifocal pneumonia and small left pleural effusion. Mild worsening of aeration at the right base since 2 days ago. Electronically Signed   By: Monte [name] M.D.   On: 04/29/2018 05:44   Dg Chest 2 View  Result Date: 05/05/2018 CLINICAL DATA:  Pneumonia. EXAM: CHEST - 2 VIEW COMPARISON:  05/01/2018 FINDINGS: The heart size and mediastinal contours are within normal limits. Stable appearance of Port-A-Cath. Since the prior chest x-ray both lungs show increased expansion. Extensive bilateral pneumonia remains present in both right upper and lower lungs and the left lower lobe. Aeration of the left lower  lobe mildly improved. Very small bilateral pleural effusions are present. No overt pulmonary edema. No pneumothorax. The visualized skeletal structures are unremarkable. IMPRESSION: Improved expansion of both lungs and improved aeration of the left lower lobe. Bilateral pneumonia remains, right greater than left. Very small bilateral  pleural effusions. Electronically Signed   By: Aletta Edouard M.D.   On: 05/05/2018 08:38   Ct Soft Tissue Neck W Contrast  Result Date: 04/25/2018 CLINICAL DATA:  Fever. Recent admission for pneumonia and sepsis. Metastatic breast cancer. EXAM: CT NECK WITH CONTRAST TECHNIQUE: Multidetector CT imaging of the neck was performed using the standard protocol following the bolus administration of intravenous contrast. CONTRAST:  25m OMNIPAQUE IOHEXOL 300 MG/ML  SOLN COMPARISON:  None. FINDINGS: Pharynx and larynx: Moderately large retropharyngeal fluid collection compatible with effusion. This measures up to 10 mm in thickness. This appears to extend into the right neck where there is soft tissue edema around the right carotid artery extending into the right neck soft tissues. No pharyngeal or tonsillar abscess. Epiglottis normal Salivary glands: No inflammation, mass, or stone. There is edema surrounding the right submandibular gland which appears extrinsic to the gland. Thyroid: Negative Lymph nodes: Right lateral lymph node measuring 7.6 mm likely reactive,, with surrounding edema. Otherwise no enlarged or prominent lymph nodes. Vascular: Right jugular Port-A-Cath in good position. Jugular vein patent bilaterally. Normal vascular enhancement. Prominent soft tissue is seen surrounding the right carotid artery. This is likely inflammatory given the retropharyngeal effusion as well as recent sepsis. Limited intracranial: No acute abnormality. Known right occipital metastatic deposit not imaged on the current study. Visualized orbits: Negative Mastoids and visualized paranasal sinuses: Mucoperiosteal thickening in the paranasal sinuses with prior sinus surgery. Orbits not imaged Skeleton: No acute skeletal abnormality. Upper chest: Right upper lobe nodular densities. Interval improvement in right upper lobe airspace disease since 04/08/2018. Residual nodule densities may be metastatic disease or pneumonia. Small  bilateral pleural effusions. Other: None IMPRESSION: Retropharyngeal fluid collection compatible with effusion or possibly abscess. Soft tissue thickening extends into the right neck and surrounds the right carotid bifurcation and right internal carotid artery. There is also streaky density in the right lateral neck soft tissues with a focal 7.6 Mm lymph node in the right lateral neck. Stranding surrounds the right submandibular gland. Findings are most compatible with infection. Favor pharyngitis with retropharyngeal effusion/abscess. Soft tissue thickening around the right carotid most compatible with infection rather than tumor. Right jugular vein is patent. Multiple nodular densities in the right upper lobe may represent residual pneumonia or metastatic disease. Interval improvement in right upper lobe infiltrate compared with CT of 04/08/2018 These results were called by telephone at the time of interpretation on 04/25/2018 at 12:36 pm to Dr. SThomes Dinning, who verbally acknowledged these results. Electronically Signed   By: CFranchot GalloM.D.   On: 04/25/2018 12:39   Ct Angio Chest Pe W Or Wo Contrast  Addendum Date: 04/27/2018   ADDENDUM REPORT: 04/27/2018 14:04 ADDENDUM: Upon further review, there is not a large pleural effusion. There is noted complete consolidation of the left lower lobe with mild-to-moderate amount of surrounding fluid or effusion. Electronically Signed   By: JMarijo Conception M.D.   On: 04/27/2018 14:04   Result Date: 04/27/2018 CLINICAL DATA:  Interstitial lung disease.  Fever, cough. EXAM: CT ANGIOGRAPHY CHEST WITH CONTRAST TECHNIQUE: Multidetector CT imaging of the chest was performed using the standard protocol during bolus administration of intravenous contrast. Multiplanar CT image reconstructions and MIPs were  obtained to evaluate the vascular anatomy. CONTRAST:  111m OMNIPAQUE IOHEXOL 350 MG/ML SOLN COMPARISON:  CT scan of April 08, 2018. FINDINGS: Cardiovascular: No  evidence of large central pulmonary embolus is noted. However, evaluation of the lower lobe branches is limited due to respiratory motion artifact. Pulmonary emboli in small peripheral branches can not be excluded. No evidence of thoracic aortic dissection or aneurysm. Mild cardiomegaly is noted. No pericardial effusion is noted. Mediastinum/Nodes: Thyroid gland and esophagus are unremarkable. Stable calcified subcarinal and right hilar adenopathy is noted. 15 mm right paratracheal lymph node is noted which is enlarged compared to prior exam. Stable 1 cm pretracheal lymph node is noted. Lungs/Pleura: No pneumothorax is noted. Large left pleural effusion is noted with atelectasis of the left lower lobe. Subsegmental atelectasis or pneumonia of the left upper lobe is noted as well. Decreased right upper and lower lobe opacities are noted suggesting improving pneumonia or atelectasis. Upper Abdomen: Stable hepatic metastases are noted. Musculoskeletal: No chest wall abnormality. No acute or significant osseous findings. Review of the MIP images confirms the above findings. IMPRESSION: No evidence of large central pulmonary embolus is noted in the main pulmonary artery or the main portions of the right and left pulmonary arteries. However, evaluation of the lower lobe branches, particularly on the right, is limited due to respiratory motion artifact and other limiting issues. Pulmonary emboli and smaller peripheral branches can not be excluded on the basis of this exam. Large left pleural effusion is noted with complete atelectasis of the left lower lobe. Increased left upper lobe opacity is noted posteriorly concerning for atelectasis or pneumonia. Improved right upper lobe and lower lobe opacities are noted suggesting improving pneumonia or atelectasis. 15 mm right paratracheal lymph node is noted which is enlarged compared to prior exam; it is uncertain if this is metastatic or inflammatory in etiology. Hepatic  metastatic lesions are again noted. Aortic Atherosclerosis (ICD10-I70.0). Electronically Signed: By: JMarijo Conception M.D. On: 04/27/2018 12:37   Ct Thoracic Spine Wo Contrast  Result Date: 05/19/2018 CLINICAL DATA:  Fall last week. Back pain and bilateral legs since fall. EXAM: CT THORACIC SPINE WITHOUT CONTRAST TECHNIQUE: Multidetector CT images of the thoracic were obtained using the standard protocol without intravenous contrast. COMPARISON:  CT chest 04/27/2018 FINDINGS: Alignment: Normal Vertebrae: Negative for fracture or metastatic disease. No lytic or sclerotic bone lesion identified. Paraspinal and other soft tissues: Extensive left lower lobe consolidation as noted previously. Progression of right lower lobe consolidation. Right upper lobe mass lesion approximately 2.4 cm is unchanged and may represent neoplasm. Calcified subcarinal lymph node unchanged. Disc levels: Disc spaces maintained without significant degenerative change or spinal stenosis. IMPRESSION: 1. Negative for fracture or metastatic disease thoracic spine 2. Bibasilar consolidation with progression on the right. Possible pneumonia or tumor. 2.4 cm right upper lobe spiculated mass may represent metastatic disease or bronchogenic carcinoma. Electronically Signed   By: CFranchot GalloM.D.   On: 05/19/2018 17:16   Ct Lumbar Spine Wo Contrast  Result Date: 05/19/2018 CLINICAL DATA:  Fall 1 week ago. Back pain. History of metastatic breast cancer. EXAM: CT LUMBAR SPINE WITHOUT CONTRAST TECHNIQUE: Multidetector CT imaging of the lumbar spine was performed without intravenous contrast administration. Multiplanar CT image reconstructions were also generated. COMPARISON:  CT abdomen pelvis 04/08/2018 FINDINGS: Segmentation: Normal Alignment: Normal Vertebrae: Negative for lumbar fracture. 3 mm sclerotic lesion L3 vertebral body. This has enlarged slightly from 03/11/2018 therefore is suspicious for metastatic disease. No other sclerotic or  lytic  lesions. Paraspinal and other soft tissues: Negative for paraspinous mass or adenopathy. Mild atherosclerotic aorta. Disc levels: Mild lumbar disc and facet degeneration. Negative for disc protrusion or stenosis. IMPRESSION: 1. Negative for lumbar fracture 2. 3 mm sclerotic lesion L4 vertebral body likely due to metastatic disease. This has enlarged since 03/21/2018. Electronically Signed   By: Franchot Gallo M.D.   On: 05/19/2018 17:23   Ct Abdomen Pelvis W Contrast  Result Date: 05/19/2018 CLINICAL DATA:  Blunt abdominal trauma. EXAM: CT ABDOMEN AND PELVIS WITH CONTRAST TECHNIQUE: Multidetector CT imaging of the abdomen and pelvis was performed using the standard protocol following bolus administration of intravenous contrast. CONTRAST:  155m OMNIPAQUE IOHEXOL 300 MG/ML  SOLN COMPARISON:  CT scan of April 08, 2018. FINDINGS: Lower chest: Bilateral posterior basilar atelectasis or pneumonia is noted. Hepatobiliary: No gallstones or biliary dilatation is noted. Intrahepatic metastatic lesions noted on prior exam are significantly smaller. 1.9 cm lesion is seen in peripheral portion of right hepatic lobe which is decreased compared to prior exam where it measured 2.9 cm. 1.5 cm left hepatic lesion is noted which is significantly smaller compared to prior exam, where it measures 3.7 cm. Pancreas: Unremarkable. No pancreatic ductal dilatation or surrounding inflammatory changes. Spleen: Calcified splenic granulomata are noted. No other significant abnormality is noted. Adrenals/Urinary Tract: Adrenal glands are unremarkable. Kidneys are normal, without renal calculi, focal lesion, or hydronephrosis. Bladder is unremarkable. Stomach/Bowel: The stomach appears normal. There is no evidence of bowel obstruction or inflammation. The appendix is not clearly visualized. Vascular/Lymphatic: Aortic atherosclerosis. No enlarged abdominal or pelvic lymph nodes. Reproductive: Uterus and bilateral adnexa are  unremarkable. Other: There is noted interval enlargement of the right iliacus muscle which is concerning for underlying hematoma. No hernia is noted. Musculoskeletal: No acute or significant osseous findings. IMPRESSION: Interval development of enlargement of right iliacus muscle with surrounding soft tissue stranding concerning for underlying hematoma or possibly severe muscular injury. Hepatic metastatic lesions noted on prior exam is significantly smaller currently. Bilateral posterior basilar atelectasis or pneumonia is noted. Aortic Atherosclerosis (ICD10-I70.0). Electronically Signed   By: JMarijo ConceptionM.D.   On: 05/19/2018 17:17   Dg Pelvis Portable  Result Date: 05/19/2018 CLINICAL DATA:  58year old female with a fall a few years ago with bilateral hip pain EXAM: PORTABLE PELVIS 1-2 VIEWS COMPARISON:  None. FINDINGS: Bony pelvic ring intact, with no acute displaced fracture. Bilateral hips projects normally over the acetabula. Mild degenerative changes bilateral hips. Unremarkable proximal femurs. IMPRESSION: Negative for acute bony abnormality. Early bilateral hip osteoarthritis Electronically Signed   By: JCorrie MckusickD.O.   On: 05/19/2018 12:34   Dg Chest Port 1 View  Result Date: 05/19/2018 CLINICAL DATA:  Fall last Tuesday. Pain in both hips and legs. Fever. EXAM: PORTABLE CHEST 1 VIEW COMPARISON:  May 05, 2018 FINDINGS: The right Port-A-Cath terminates in the central SVC. There is mild opacity in left base, significantly improved since May 05, 2018. Most of the right-sided infiltrate is also resolved. Minimal opacity remains in the medial right upper lobe. No other interval changes. IMPRESSION: Mild opacities remain in the medial right upper lobe in the left base, significantly improved in the interval. The remainder of infiltrates previously seen have resolved. Recommend follow-up to complete resolution. Electronically Signed   By: DDorise BullionIII M.D   On: 05/19/2018 12:33   Dg  Chest Port 1 View  Result Date: 05/01/2018 CLINICAL DATA:  Respiratory failure EXAM: PORTABLE CHEST 1 VIEW COMPARISON:  04/29/2018  FINDINGS: Right Port-A-Cath remains in place, unchanged. Bilateral airspace disease, worsening on the right since prior study. Layering effusions noted. Heart is borderline in size. IMPRESSION: Bilateral airspace opacities, worsening on the right since prior study. Bilateral layering effusions. Electronically Signed   By: Rolm Baptise M.D.   On: 05/01/2018 07:21   Dg Chest Port 1 View  Result Date: 04/27/2018 CLINICAL DATA:  Status post left thoracentesis. EXAM: PORTABLE CHEST 1 VIEW COMPARISON:  Radiograph of same day. FINDINGS: Stable cardiomediastinal silhouette. Right internal jugular Port-A-Cath is unchanged in position. Stable right upper lobe opacity is noted. Stable left basilar opacity is noted consistent with pneumonia or atelectasis and associated pleural effusion. No pneumothorax is noted. Bony thorax is unremarkable. IMPRESSION: No pneumothorax status post left-sided thoracentesis. Stable left basilar opacity is noted consistent with pneumonia or atelectasis and associated pleural effusion. Stable right upper lobe opacity is noted which may represent acute inflammation or possibly scarring. Electronically Signed   By: Marijo Conception, M.D.   On: 04/27/2018 10:36   Dg Chest Port 1 View  Result Date: 04/27/2018 CLINICAL DATA:  Hypoxia EXAM: PORTABLE CHEST 1 VIEW COMPARISON:  04/24/2018 FINDINGS: Right upper lobe opacity, reflecting residual pneumonia/post infectious scarring, improved from prior CT. Moderate layering left pleural effusion, significantly increased from recent chest radiograph. Associated left upper lobe opacity, possibly reflecting infection and/or compressive atelectasis. No pneumothorax. The heart is top-normal in size. Right chest power port terminates in the cavoatrial junction. IMPRESSION: Moderate layering left pleural effusion, significantly  increased. Associated left upper lobe opacity, possibly reflecting infection and/or compressive atelectasis. Right upper lobe opacity, reflecting residual pneumonia/post infectious scarring, improved from prior CT. Electronically Signed   By: Julian Hy M.D.   On: 04/27/2018 06:30   Dg Chest Port 1 View  Result Date: 04/24/2018 CLINICAL DATA:  Fever.  Metastatic breast carcinoma EXAM: PORTABLE CHEST 1 VIEW COMPARISON:  April 12, 2018 FINDINGS: There has been significant clearing of consolidation from the right upper lobe. A small amount of patchy opacity with volume loss remains in this area. Atelectasis remains in the left base. No new opacity evident. Heart is upper normal in size with pulmonary vascularity normal. No adenopathy. There is aortic atherosclerosis. Port-A-Cath tip is in the superior vena cava. No pneumothorax. No bone lesions are appreciable. IMPRESSION: Significant partial clearing of consolidation from the right upper lobe. Patchy infiltrate and volume loss remains in this area. Stable left base atelectasis. No new opacity. Stable cardiac silhouette. Stable Port-A-Cath positioning. Aortic Atherosclerosis (ICD10-I70.0). Electronically Signed   By: Lowella Grip III M.D.   On: 04/24/2018 09:32   US Thoracentesis Asp Pleural Space W/img Guide  Result Date: 04/27/2018 INDICATION: Patient with shortness of breath, pleural effusion. Request is made for diagnostic and therapeutic left thoracentesis. EXAM: ULTRASOUND GUIDED DIAGNOSTIC AND THERAPEUTIC LEFT THORACENTESIS MEDICATIONS: 10 mL 1% lidocaine COMPLICATIONS: None immediate. PROCEDURE: An ultrasound guided thoracentesis was thoroughly discussed with the patient and questions answered. The benefits, risks, alternatives and complications were also discussed. The patient understands and wishes to proceed with the procedure. Written consent was obtained. Ultrasound was performed to localize and mark an adequate pocket of fluid in the  left chest. The area was then prepped and draped in the normal sterile fashion. 1% Lidocaine was used for local anesthesia. Under ultrasound guidance a 6 Fr Safe-T-Centesis catheter was introduced. Thoracentesis was performed. The catheter was removed and a dressing applied. FINDINGS: Pleural fluid volume by ultrasound is small. A total of approximately 120 mL  of blood-tinged fluid was removed. Samples were sent to the laboratory as requested by the clinical team. IMPRESSION: Successful ultrasound guided left thoracentesis yielding 120 mL of blood-tinged of pleural fluid. Read by: Brynda Greathouse PA-C Electronically Signed   By: Aletta Edouard M.D.   On: 04/27/2018 09:59    All questions were answered. The patient knows to call the clinic with any problems, questions or concerns. No barriers to learning was detected.  I spent 70 minutes counseling the patient face to face. The total time spent in the appointment was 90 minutes and more than 50% was on counseling and review of test results  [name], MD 05/20/2018 4:11 PM

## 2018-05-20 NOTE — Assessment & Plan Note (Signed)
Her lung nodule is stable Bilateral pulmonary infiltrate has improved dramatically Her oxygen requirement is improving and she has less cough

## 2018-05-20 NOTE — Assessment & Plan Note (Addendum)
I have reviewed her blood work and imaging studies with the patient and her sister over the telephone With her permission, I have also taken some comparable images of her most recent CT scan yesterday and her CT from March Her blood work show minimum improvement of liver enzymes but CT imaging show marked improvement She has only received 1 dose of chemotherapy and her subsequent treatment was delayed due to severe, recurrent pancytopenia. Her overall performance status score is very poor She is not ready to resume chemotherapy yet I plan to see her weekly for aggressive supportive care.

## 2018-05-21 ENCOUNTER — Telehealth: Payer: Self-pay

## 2018-05-21 ENCOUNTER — Telehealth: Payer: Self-pay | Admitting: Hematology and Oncology

## 2018-05-21 LAB — BPAM RBC
Blood Product Expiration Date: 202004272359
Blood Product Expiration Date: 202004292359
ISSUE DATE / TIME: 202004211128
ISSUE DATE / TIME: 202004211511
Unit Type and Rh: 6200
Unit Type and Rh: 6200

## 2018-05-21 LAB — TYPE AND SCREEN
ABO/RH(D): A POS
Antibody Screen: NEGATIVE
Unit division: 0
Unit division: 0

## 2018-05-21 LAB — URINE CULTURE: Culture: NO GROWTH

## 2018-05-21 NOTE — Telephone Encounter (Signed)
Spoke with pt by phone and gave results of urine culture.  Also confirmed upcoming appt's on 4/24 and 4/28

## 2018-05-21 NOTE — Telephone Encounter (Signed)
Scheduled appt per 4/21 sch message - unable to reach patient . Left message with appt date and time  

## 2018-05-23 ENCOUNTER — Inpatient Hospital Stay: Payer: BLUE CROSS/BLUE SHIELD

## 2018-05-23 ENCOUNTER — Other Ambulatory Visit: Payer: Self-pay

## 2018-05-23 ENCOUNTER — Telehealth: Payer: Self-pay

## 2018-05-23 DIAGNOSIS — C50919 Malignant neoplasm of unspecified site of unspecified female breast: Secondary | ICD-10-CM

## 2018-05-23 DIAGNOSIS — C7951 Secondary malignant neoplasm of bone: Secondary | ICD-10-CM

## 2018-05-23 DIAGNOSIS — Z5112 Encounter for antineoplastic immunotherapy: Secondary | ICD-10-CM | POA: Diagnosis not present

## 2018-05-23 DIAGNOSIS — C787 Secondary malignant neoplasm of liver and intrahepatic bile duct: Secondary | ICD-10-CM

## 2018-05-23 DIAGNOSIS — C7931 Secondary malignant neoplasm of brain: Secondary | ICD-10-CM

## 2018-05-23 LAB — CBC WITH DIFFERENTIAL (CANCER CENTER ONLY)
Abs Immature Granulocytes: 3.54 10*3/uL — ABNORMAL HIGH (ref 0.00–0.07)
Basophils Absolute: 0.3 10*3/uL — ABNORMAL HIGH (ref 0.0–0.1)
Basophils Relative: 2 %
Eosinophils Absolute: 0 10*3/uL (ref 0.0–0.5)
Eosinophils Relative: 0 %
HCT: 31.4 % — ABNORMAL LOW (ref 36.0–46.0)
Hemoglobin: 9.9 g/dL — ABNORMAL LOW (ref 12.0–15.0)
Immature Granulocytes: 21 %
Lymphocytes Relative: 16 %
Lymphs Abs: 2.7 10*3/uL (ref 0.7–4.0)
MCH: 27.9 pg (ref 26.0–34.0)
MCHC: 31.5 g/dL (ref 30.0–36.0)
MCV: 88.5 fL (ref 80.0–100.0)
Monocytes Absolute: 2 10*3/uL — ABNORMAL HIGH (ref 0.1–1.0)
Monocytes Relative: 12 %
Neutro Abs: 8.4 10*3/uL — ABNORMAL HIGH (ref 1.7–7.7)
Neutrophils Relative %: 49 %
Platelet Count: 44 10*3/uL — ABNORMAL LOW (ref 150–400)
RBC: 3.55 MIL/uL — ABNORMAL LOW (ref 3.87–5.11)
RDW: 16.3 % — ABNORMAL HIGH (ref 11.5–15.5)
WBC Count: 17 10*3/uL — ABNORMAL HIGH (ref 4.0–10.5)
nRBC: 0.1 % (ref 0.0–0.2)

## 2018-05-23 LAB — CMP (CANCER CENTER ONLY)
ALT: 31 U/L (ref 0–44)
AST: 66 U/L — ABNORMAL HIGH (ref 15–41)
Albumin: 2 g/dL — ABNORMAL LOW (ref 3.5–5.0)
Alkaline Phosphatase: 282 U/L — ABNORMAL HIGH (ref 38–126)
Anion gap: 10 (ref 5–15)
BUN: 8 mg/dL (ref 6–20)
CO2: 29 mmol/L (ref 22–32)
Calcium: 8.6 mg/dL — ABNORMAL LOW (ref 8.9–10.3)
Chloride: 100 mmol/L (ref 98–111)
Creatinine: 0.55 mg/dL (ref 0.44–1.00)
GFR, Est AFR Am: >60 mL/min (ref >60)
GFR, Estimated: >60 mL/min (ref >60)
Glucose, Bld: 87 mg/dL (ref 70–99)
Potassium: 3.7 mmol/L (ref 3.5–5.1)
Sodium: 139 mmol/L (ref 135–145)
Total Bilirubin: 0.8 mg/dL (ref 0.3–1.2)
Total Protein: 7.1 g/dL (ref 6.5–8.1)

## 2018-05-23 LAB — SAMPLE TO BLOOD BANK

## 2018-05-23 NOTE — Telephone Encounter (Signed)
Called [facility] and told per Dr. Alvy Bimler, she does not need a blood transfusion and overall her blood work is better. Dr. Alvy Bimler will see her on 4/28.  She verbalized understanding.

## 2018-05-24 LAB — CULTURE, BLOOD (ROUTINE X 2)
Culture: NO GROWTH
Culture: NO GROWTH

## 2018-05-27 ENCOUNTER — Other Ambulatory Visit: Payer: Self-pay

## 2018-05-27 ENCOUNTER — Inpatient Hospital Stay: Payer: BLUE CROSS/BLUE SHIELD

## 2018-05-27 ENCOUNTER — Encounter: Payer: Self-pay | Admitting: Hematology and Oncology

## 2018-05-27 ENCOUNTER — Inpatient Hospital Stay: Payer: BLUE CROSS/BLUE SHIELD | Admitting: Hematology and Oncology

## 2018-05-27 DIAGNOSIS — J9601 Acute respiratory failure with hypoxia: Secondary | ICD-10-CM

## 2018-05-27 DIAGNOSIS — C50919 Malignant neoplasm of unspecified site of unspecified female breast: Secondary | ICD-10-CM

## 2018-05-27 DIAGNOSIS — C787 Secondary malignant neoplasm of liver and intrahepatic bile duct: Secondary | ICD-10-CM

## 2018-05-27 DIAGNOSIS — E440 Moderate protein-calorie malnutrition: Secondary | ICD-10-CM

## 2018-05-27 DIAGNOSIS — C7801 Secondary malignant neoplasm of right lung: Secondary | ICD-10-CM

## 2018-05-27 DIAGNOSIS — C50312 Malignant neoplasm of lower-inner quadrant of left female breast: Secondary | ICD-10-CM

## 2018-05-27 DIAGNOSIS — C7931 Secondary malignant neoplasm of brain: Secondary | ICD-10-CM

## 2018-05-27 DIAGNOSIS — Z9981 Dependence on supplemental oxygen: Secondary | ICD-10-CM

## 2018-05-27 DIAGNOSIS — C7951 Secondary malignant neoplasm of bone: Secondary | ICD-10-CM

## 2018-05-27 DIAGNOSIS — R635 Abnormal weight gain: Secondary | ICD-10-CM

## 2018-05-27 DIAGNOSIS — C7802 Secondary malignant neoplasm of left lung: Secondary | ICD-10-CM | POA: Diagnosis not present

## 2018-05-27 DIAGNOSIS — K5909 Other constipation: Secondary | ICD-10-CM

## 2018-05-27 DIAGNOSIS — D61818 Other pancytopenia: Secondary | ICD-10-CM

## 2018-05-27 DIAGNOSIS — Z5112 Encounter for antineoplastic immunotherapy: Secondary | ICD-10-CM | POA: Diagnosis not present

## 2018-05-27 DIAGNOSIS — Z171 Estrogen receptor negative status [ER-]: Secondary | ICD-10-CM

## 2018-05-27 DIAGNOSIS — E1165 Type 2 diabetes mellitus with hyperglycemia: Secondary | ICD-10-CM

## 2018-05-27 DIAGNOSIS — Z794 Long term (current) use of insulin: Secondary | ICD-10-CM

## 2018-05-27 DIAGNOSIS — Z7189 Other specified counseling: Secondary | ICD-10-CM

## 2018-05-27 LAB — CMP (CANCER CENTER ONLY)
ALT: 23 U/L (ref 0–44)
AST: 33 U/L (ref 15–41)
Albumin: 2.2 g/dL — ABNORMAL LOW (ref 3.5–5.0)
Alkaline Phosphatase: 215 U/L — ABNORMAL HIGH (ref 38–126)
Anion gap: 11 (ref 5–15)
BUN: 10 mg/dL (ref 6–20)
CO2: 31 mmol/L (ref 22–32)
Calcium: 9.3 mg/dL (ref 8.9–10.3)
Chloride: 95 mmol/L — ABNORMAL LOW (ref 98–111)
Creatinine: 0.67 mg/dL (ref 0.44–1.00)
GFR, Est AFR Am: >60 mL/min (ref >60)
GFR, Estimated: >60 mL/min (ref >60)
Glucose, Bld: 151 mg/dL — ABNORMAL HIGH (ref 70–99)
Potassium: 4 mmol/L (ref 3.5–5.1)
Sodium: 137 mmol/L (ref 135–145)
Total Bilirubin: 0.8 mg/dL (ref 0.3–1.2)
Total Protein: 7.8 g/dL (ref 6.5–8.1)

## 2018-05-27 LAB — CBC WITH DIFFERENTIAL (CANCER CENTER ONLY)
Abs Immature Granulocytes: 1.41 10*3/uL — ABNORMAL HIGH (ref 0.00–0.07)
Basophils Absolute: 0.8 10*3/uL — ABNORMAL HIGH (ref 0.0–0.1)
Basophils Relative: 6 %
Eosinophils Absolute: 0 10*3/uL (ref 0.0–0.5)
Eosinophils Relative: 0 %
HCT: 30.8 % — ABNORMAL LOW (ref 36.0–46.0)
Hemoglobin: 9.3 g/dL — ABNORMAL LOW (ref 12.0–15.0)
Immature Granulocytes: 11 %
Lymphocytes Relative: 20 %
Lymphs Abs: 2.7 10*3/uL (ref 0.7–4.0)
MCH: 27.5 pg (ref 26.0–34.0)
MCHC: 30.2 g/dL (ref 30.0–36.0)
MCV: 91.1 fL (ref 80.0–100.0)
Monocytes Absolute: 1.5 10*3/uL — ABNORMAL HIGH (ref 0.1–1.0)
Monocytes Relative: 11 %
Neutro Abs: 6.7 10*3/uL (ref 1.7–7.7)
Neutrophils Relative %: 52 %
Platelet Count: 53 10*3/uL — ABNORMAL LOW (ref 150–400)
RBC: 3.38 MIL/uL — ABNORMAL LOW (ref 3.87–5.11)
RDW: 16.7 % — ABNORMAL HIGH (ref 11.5–15.5)
WBC Count: 13 10*3/uL — ABNORMAL HIGH (ref 4.0–10.5)
WBC Morphology: 2
nRBC: 0.2 % (ref 0.0–0.2)

## 2018-05-27 LAB — SAMPLE TO BLOOD BANK

## 2018-05-27 NOTE — Assessment & Plan Note (Signed)
She is recovering well from recent infection She will resume chemotherapy tomorrow I plan drastic dose adjustment due to chronic, baseline pancytopenia and recent weight loss I will reduce the dose of Taxotere to a third of the level but keep the other 2 treatment at full dose adjusted to her weight I will see her again in 1 week for further supportive care and follow-up

## 2018-05-27 NOTE — Assessment & Plan Note (Signed)
We discussed goals of care Overall, she has improved dramatically since last time I saw her We will proceed with palliative chemotherapy tomorrow

## 2018-05-27 NOTE — Assessment & Plan Note (Signed)
Her respiratory failure has resolved We will continue our efforts to wean her off oxygen therapy

## 2018-05-27 NOTE — Assessment & Plan Note (Signed)
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

## 2018-05-27 NOTE — Assessment & Plan Note (Signed)
The cause of her severe pancytopenia is due to retroperitoneal hematoma from her recent fall, on the background of bone marrow involvement The plan is to get her hemoglobin above 7 She does not need transfusion support today

## 2018-05-27 NOTE — Assessment & Plan Note (Signed)
Her oral intake is improving She will continue to increase oral intake as tolerated She is scheduled to follow with dietitian

## 2018-05-27 NOTE — Assessment & Plan Note (Signed)
This has resolved with laxative therapy.  She will continue the same

## 2018-05-27 NOTE — Progress Notes (Signed)
[place] OFFICE PROGRESS NOTE  Patient Care Team: Robyne Peers, MD as PCP - General (Family Medicine)  ASSESSMENT & PLAN:  Metastatic breast cancer [facility]) She is recovering well from recent infection She will resume chemotherapy tomorrow I plan drastic dose adjustment due to chronic, baseline pancytopenia and recent weight loss I will reduce the dose of Taxotere to a third of the level but keep the other 2 treatment at full dose adjusted to her weight I will see her again in 1 week for further supportive care and follow-up  Metastasis to bone [facility]) She has chronic pancytopenia due to metastasis to her bone I will plan dose adjustment for her She will continue intermittent doses of pain medicine as needed  Pancytopenia, acquired ([place]) The cause of her severe pancytopenia is due to retroperitoneal hematoma from her recent fall, on the background of bone marrow involvement The plan is to get her hemoglobin above 7 She does not need transfusion support today  Acute respiratory failure with hypoxia ([place]) Her respiratory failure has resolved We will continue our efforts to wean her off oxygen therapy  Other constipation This has resolved with laxative therapy.  She will continue the same  Malnutrition of moderate degree Her oral intake is improving She will continue to increase oral intake as tolerated She is scheduled to follow with dietitian  Goals of care, counseling/discussion We discussed goals of care Overall, she has improved dramatically since last time I saw her We will proceed with palliative chemotherapy tomorrow   No orders of the defined types were placed in this encounter.   INTERVAL HISTORY: Please see below for problem oriented charting. She returns for further follow-up Since last time I saw her, she is doing well She is eating more and is gaining weight Her shortness of breath or cough is almost completely resolved Constipation has  resolved She continues to have mild intermittent back pain but she is attempting to walk whenever possible She denies recent falls No recent nausea Overall, she felt stronger  SUMMARY OF ONCOLOGIC HISTORY: Oncology History   Biopsy is ER negative Her2/neu positive     Metastatic breast cancer ([place])   03/11/2018 Imaging    Ct scan of chest, abdomen and pelvis 1. 2.6 x 2.3 cm spiculated mass identified inferomedial quadrant of the left breast. Lesion appears to retract the overlying skin. Mammographic correlation recommended. 2. Small lymph nodes in the left axillar ill-defined and suspicious for metastatic disease. 3. Multiple bilateral pulmonary nodules measuring up to 2.7 cm diameter. These probably represent metastatic disease. Given the dominant size of the central right upper lobe lesion, synchronous lung primary can not be completely excluded. 4. Multiple ill-defined liver lesions compatible with metastatic disease. Dominant liver metastases measure up to almost 4 cm. 5.  Aortic Atherosclerois (ICD10-170.0)     03/12/2018 Pathology Results    Liver, needle/core biopsy, left lobe - METASTATIC CARCINOMA. - LYMPHOVASCULAR INVASION IS IDENTIFIED. - SEE COMMENT. Microscopic Comment The tumor cells are positive for cytokeratin 7. There is faint staining for GATA-3. There is non-specific staining for TTF-1. Cytokeratin 20, CDX-2, estrogen receptor, and GCDFP stains are negative. Given the clinical suspicion, the profile supports a primary breast carcinoma. Her2 will be performed and the results reported separately.  By immunohistochemistry, the tumor cells are POSITIVE for Her2 (3+).    03/12/2018 Imaging    Single 1.3 x 1.4 cm RIGHT occipital metastasis.  Borderline pachymeningeal enhancement of symmetric nature could be related to the  superficial metastasis or osseous disease.    03/12/2018 Procedure    Ultrasound-guided core biopsy performed of a mass within the lateral segment  of the left lobe of the liver.    03/16/2018 Imaging    Right occipital lobe 13 x 16 mm. Small satellite enhancing nodule deep to the larger lesion. The larger lesion shows central necrosis and probable mild hemorrhage or calcification.  Mild dural enhancement is less impressive and could be within normal limits.  Bone marrow in the clivus and cervical spine is diffusely low signal. No focal lesion. This may be related to the patient's anemia and abnormal blood count.    03/17/2018 Cancer Staging    Staging form: Breast, AJCC 8th Edition - Clinical: Stage IV (cT2, cN0, pM1, GX, ER-, PR: Not Assessed, HER2+) - Signed by [name], MD on 03/17/2018    03/19/2018 -  Chemotherapy    She received chemo with Taxotere, Herceptin and Perjeta    03/21/2018 Echocardiogram    IMPRESSIONS 1. The left ventricle has normal systolic function with an ejection fraction of 60-65%. The cavity size was normal. Left ventricular diastolic Doppler parameters are consistent with impaired relaxation.  2. The right ventricle has normal systolic function. The cavity was normal. There is no increase in right ventricular wall thickness.  3. The mitral valve is normal in structure.  4. The tricuspid valve is normal in structure.  5. Strain imaging performed but not reported due to interpreter judgement, secondary to suboptimal image quality.    03/24/2018 Procedure    Successful placement of a right IJ approach Power Port with ultrasound and fluoroscopic guidance. The catheter is ready for use.    04/07/2018 - 04/16/2018 Hospital Admission    She was admitted for pneumonia    04/07/2018 - 04/16/2018 Hospital Admission    She was admitted to the hospital for pneumonia and respiratory failure    04/25/2018 Imaging    Retropharyngeal fluid collection compatible with effusion or possibly abscess. Soft tissue thickening extends into the right neck and surrounds the right carotid bifurcation and right internal carotid artery.  There is also streaky density in the right lateral neck soft tissues with a focal 7.6 Mm lymph node in the right lateral neck. Stranding surrounds the right submandibular gland.   Findings are most compatible with infection. Favor pharyngitis with retropharyngeal effusion/abscess. Soft tissue thickening around the right carotid most compatible with infection rather than tumor. Right jugular vein is patent.  Multiple nodular densities in the right upper lobe may represent residual pneumonia or metastatic disease. Interval improvement in right upper lobe infiltrate compared with CT of 04/08/2018    04/25/2018 - 05/06/2018 Hospital Admission    She was admitted for retropharyngeal abscess    04/27/2018 Imaging    No evidence of large central pulmonary embolus is noted in the main pulmonary artery or the main portions of the right and left pulmonary arteries. However, evaluation of the lower lobe branches, particularly on the right, is limited due to respiratory motion artifact and other limiting issues. Pulmonary emboli and smaller peripheral branches can not be excluded on the basis of this exam.  Large left pleural effusion is noted with complete atelectasis of the left lower lobe. Increased left upper lobe opacity is noted posteriorly concerning for atelectasis or pneumonia.  Improved right upper lobe and lower lobe opacities are noted suggesting improving pneumonia or atelectasis.  15 mm right paratracheal lymph node is noted which is enlarged compared to prior exam; it  is uncertain if this is metastatic or inflammatory in etiology.  Hepatic metastatic lesions are again noted.  Aortic Atherosclerosis (ICD10-I70.0).     04/27/2018 Procedure    Successful ultrasound guided left thoracentesis yielding 120 mL of blood-tinged of pleural fluid.     Metastasis to brain ([place])   03/17/2018 Initial Diagnosis    Metastasis to brain [facility])     Metastasis to liver ([place])   03/17/2018 Initial  Diagnosis    Metastasis to liver [facility])     Metastasis to bone ([place])   03/17/2018 Initial Diagnosis    Metastasis to bone ([facility])     REVIEW OF SYSTEMS:   Constitutional: Denies fevers, chills or abnormal weight loss Eyes: Denies blurriness of vision Ears, nose, mouth, throat, and face: Denies mucositis or sore throat Respiratory: Denies cough, dyspnea or wheezes Cardiovascular: Denies palpitation, chest discomfort or lower extremity swelling Gastrointestinal:  Denies nausea, heartburn or change in bowel habits Skin: Denies abnormal skin rashes Lymphatics: Denies new lymphadenopathy or easy bruising Neurological:Denies numbness, tingling or new weaknesses Behavioral/Psych: Mood is stable, no new changes  All other systems were reviewed with the patient and are negative.  I have reviewed the past medical history, past surgical history, social history and family history with the patient and they are unchanged from previous note.  ALLERGIES:  is allergic to nsaids.  MEDICATIONS:  Current Outpatient Medications  Medication Sig Dispense Refill  . ACCU-CHEK AVIVA PLUS test strip     . ACCU-CHEK SOFTCLIX LANCETS lancets     . albuterol (PROAIR HFA) 108 (90 Base) MCG/ACT inhaler Inhale 2 puffs into the lungs every 6 (six) hours as needed for wheezing or shortness of breath. 1 Inhaler 1  . benzonatate (TESSALON) 100 MG capsule Take 100 mg by mouth 3 (three) times daily as needed for cough.     . blood glucose meter kit and supplies KIT Dispense based on patient and insurance preference. Use up to four times daily as directed. (FOR ICD-9 250.00, 250.01). 1 each 0  . chlorpheniramine-HYDROcodone (TUSSIONEX) 10-8 MG/5ML SUER Take 5 mLs by mouth every 12 (twelve) hours. (Patient taking differently: Take 5 mLs by mouth every 12 (twelve) hours as needed for cough. ) 140 mL 0  . dexamethasone (DECADRON) 4 MG tablet Take 1 tablet in the morning before chemo, none on the day of chemo and daily in the  morning after chemo for 2 days, with food (Patient taking differently: Take 4 mg by mouth See admin instructions. Take 4 mg in the morning before chemo, none on the day of chemo and daily in the morning after chemo for 2 days with food) 30 tablet 1  . esomeprazole (NEXIUM) 40 MG capsule Take 1 capsule (40 mg total) by mouth 2 (two) times daily before a meal. 180 capsule 1  . fluticasone (FLOVENT HFA) 44 MCG/ACT inhaler Inhale 2 puffs into the lungs 2 (two) times daily. 3 Inhaler 1  . insulin aspart (NOVOLOG) 100 UNIT/ML FlexPen Inject 8 Units into the skin 3 (three) times daily with meals. 15 mL 0  . Insulin Detemir (LEVEMIR) 100 UNIT/ML Pen Inject 10 Units into the skin daily. (Patient taking differently: Inject 10 Units into the skin at bedtime. ) 15 mL 0  . Insulin Pen Needle 31G X 5 MM MISC 1 Device by Does not apply route QID. For use with insulin pens 100 each 0  . lidocaine-prilocaine (EMLA) cream Apply to affected area once (Patient taking differently: Apply 1 application topically as  needed (port access). Apply to affected area once) 30 g 3  . LORazepam (ATIVAN) 0.5 MG tablet Take 1 tab po 30 minutes prior to radiation or MRI (Patient taking differently: Take 0.5 mg by mouth See admin instructions. Take 30 minutes prior to radiation or MRI) 30 tablet 0  . metoprolol succinate (TOPROL-XL) 50 MG 24 hr tablet Take 50 mg by mouth every evening.    . montelukast (SINGULAIR) 10 MG tablet TAKE 1 TABLET BY MOUTH EVERYDAY AT BEDTIME (Patient taking differently: Take 10 mg by mouth daily. ) 90 tablet 0  . morphine (MSIR) 15 MG tablet Take 1 tablet (15 mg total) by mouth every 4 (four) hours as needed for severe pain. 60 tablet 0  . Multiple Vitamin (MULTIVITAMIN WITH MINERALS) TABS tablet Take 1 tablet by mouth daily.     No current facility-administered medications for this visit.    Facility-Administered Medications Ordered in Other Visits  Medication Dose Route Frequency Provider Last Rate Last  Dose  . heparin lock flush 100 unit/mL  250 Units Intracatheter PRN Alisse Tuite, MD      . sodium chloride flush (NS) 0.9 % injection 3 mL  3 mL Intracatheter PRN Alvy Bimler, Delmore Sear, MD        PHYSICAL EXAMINATION: ECOG PERFORMANCE STATUS: 2 - Symptomatic, <50% confined to bed  Vitals:   05/27/18 1012  BP: (!) 105/57  Pulse: (!) 124  Resp: 18  Temp: 98.3 F (36.8 C)  SpO2: 97%   Filed Weights   05/27/18 1012  Weight: 136 lb (61.7 kg)    GENERAL:alert, no distress and comfortable.  She has oxygen delivered via nasal cannula SKIN: skin color, texture, turgor are normal, no rashes or significant lesions.  Mild bruises are noted EYES: normal, Conjunctiva are pink and non-injected, sclera clear OROPHARYNX:no exudate, no erythema and lips, buccal mucosa, and tongue normal  NECK: supple, thyroid normal size, non-tender, without nodularity LYMPH:  no palpable lymphadenopathy in the cervical, axillary or inguinal LUNGS: clear to auscultation and percussion with normal breathing effort HEART: Tachycardia, regular rate & rhythm and no murmurs and no lower extremity edema ABDOMEN:abdomen soft, non-tender and normal bowel sounds Musculoskeletal:no cyanosis of digits and no clubbing  NEURO: alert & oriented x 3 with fluent speech, no focal motor/sensory deficits  LABORATORY DATA:  I have reviewed the data as listed    Component Value Date/Time   NA 137 05/27/2018 0858   K 4.0 05/27/2018 0858   CL 95 (L) 05/27/2018 0858   CO2 31 05/27/2018 0858   GLUCOSE 151 (H) 05/27/2018 0858   BUN 10 05/27/2018 0858   CREATININE 0.67 05/27/2018 0858   CALCIUM 9.3 05/27/2018 0858   PROT 7.8 05/27/2018 0858   ALBUMIN 2.2 (L) 05/27/2018 0858   AST 33 05/27/2018 0858   ALT 23 05/27/2018 0858   ALKPHOS 215 (H) 05/27/2018 0858   BILITOT 0.8 05/27/2018 0858   GFRNONAA >60 05/27/2018 0858   GFRAA >60 05/27/2018 0858    No results found for: SPEP, UPEP  Lab Results  Component Value Date   WBC 13.0 (H)  05/27/2018   NEUTROABS 6.7 05/27/2018   HGB 9.3 (L) 05/27/2018   HCT 30.8 (L) 05/27/2018   MCV 91.1 05/27/2018   PLT 53 (L) 05/27/2018      Chemistry      Component Value Date/Time   NA 137 05/27/2018 0858   K 4.0 05/27/2018 0858   CL 95 (L) 05/27/2018 0858   CO2 31 05/27/2018 0858  BUN 10 05/27/2018 0858   CREATININE 0.67 05/27/2018 0858      Component Value Date/Time   CALCIUM 9.3 05/27/2018 0858   ALKPHOS 215 (H) 05/27/2018 0858   AST 33 05/27/2018 0858   ALT 23 05/27/2018 0858   BILITOT 0.8 05/27/2018 0858       RADIOGRAPHIC STUDIES: I have personally reviewed the radiological images as listed and agreed with the findings in the report. Dg Chest 1 View  Result Date: 04/29/2018 CLINICAL DATA:  Tachypnea EXAM: CHEST  1 VIEW COMPARISON:  Chest CT from 2 days ago FINDINGS: There is small left pleural effusion and multifocal consolidation, particularly dense in the left lower lobe by prior CT. No superimposed Kerley lines. No pneumothorax. Stable heart size. Porta catheter on the right with tip at the SVC. IMPRESSION: Multifocal pneumonia and small left pleural effusion. Mild worsening of aeration at the right base since 2 days ago. Electronically Signed   By: Monte [name] M.D.   On: 04/29/2018 05:44   Dg Chest 2 View  Result Date: 05/05/2018 CLINICAL DATA:  Pneumonia. EXAM: CHEST - 2 VIEW COMPARISON:  05/01/2018 FINDINGS: The heart size and mediastinal contours are within normal limits. Stable appearance of Port-A-Cath. Since the prior chest x-ray both lungs show increased expansion. Extensive bilateral pneumonia remains present in both right upper and lower lungs and the left lower lobe. Aeration of the left lower lobe mildly improved. Very small bilateral pleural effusions are present. No overt pulmonary edema. No pneumothorax. The visualized skeletal structures are unremarkable. IMPRESSION: Improved expansion of both lungs and improved aeration of the left lower lobe.  Bilateral pneumonia remains, right greater than left. Very small bilateral pleural effusions. Electronically Signed   By: Aletta Edouard M.D.   On: 05/05/2018 08:38   Ct Angio Chest Pe W Or Wo Contrast  Addendum Date: 04/27/2018   ADDENDUM REPORT: 04/27/2018 14:04 ADDENDUM: Upon further review, there is not a large pleural effusion. There is noted complete consolidation of the left lower lobe with mild-to-moderate amount of surrounding fluid or effusion. Electronically Signed   By: Marijo Conception, M.D.   On: 04/27/2018 14:04   Result Date: 04/27/2018 CLINICAL DATA:  Interstitial lung disease.  Fever, cough. EXAM: CT ANGIOGRAPHY CHEST WITH CONTRAST TECHNIQUE: Multidetector CT imaging of the chest was performed using the standard protocol during bolus administration of intravenous contrast. Multiplanar CT image reconstructions and MIPs were obtained to evaluate the vascular anatomy. CONTRAST:  130m OMNIPAQUE IOHEXOL 350 MG/ML SOLN COMPARISON:  CT scan of April 08, 2018. FINDINGS: Cardiovascular: No evidence of large central pulmonary embolus is noted. However, evaluation of the lower lobe branches is limited due to respiratory motion artifact. Pulmonary emboli in small peripheral branches can not be excluded. No evidence of thoracic aortic dissection or aneurysm. Mild cardiomegaly is noted. No pericardial effusion is noted. Mediastinum/Nodes: Thyroid gland and esophagus are unremarkable. Stable calcified subcarinal and right hilar adenopathy is noted. 15 mm right paratracheal lymph node is noted which is enlarged compared to prior exam. Stable 1 cm pretracheal lymph node is noted. Lungs/Pleura: No pneumothorax is noted. Large left pleural effusion is noted with atelectasis of the left lower lobe. Subsegmental atelectasis or pneumonia of the left upper lobe is noted as well. Decreased right upper and lower lobe opacities are noted suggesting improving pneumonia or atelectasis. Upper Abdomen: Stable hepatic  metastases are noted. Musculoskeletal: No chest wall abnormality. No acute or significant osseous findings. Review of the MIP images confirms the above findings.  IMPRESSION: No evidence of large central pulmonary embolus is noted in the main pulmonary artery or the main portions of the right and left pulmonary arteries. However, evaluation of the lower lobe branches, particularly on the right, is limited due to respiratory motion artifact and other limiting issues. Pulmonary emboli and smaller peripheral branches can not be excluded on the basis of this exam. Large left pleural effusion is noted with complete atelectasis of the left lower lobe. Increased left upper lobe opacity is noted posteriorly concerning for atelectasis or pneumonia. Improved right upper lobe and lower lobe opacities are noted suggesting improving pneumonia or atelectasis. 15 mm right paratracheal lymph node is noted which is enlarged compared to prior exam; it is uncertain if this is metastatic or inflammatory in etiology. Hepatic metastatic lesions are again noted. Aortic Atherosclerosis (ICD10-I70.0). Electronically Signed: By: Marijo Conception, M.D. On: 04/27/2018 12:37   Ct Thoracic Spine Wo Contrast  Result Date: 05/19/2018 CLINICAL DATA:  Fall last week. Back pain and bilateral legs since fall. EXAM: CT THORACIC SPINE WITHOUT CONTRAST TECHNIQUE: Multidetector CT images of the thoracic were obtained using the standard protocol without intravenous contrast. COMPARISON:  CT chest 04/27/2018 FINDINGS: Alignment: Normal Vertebrae: Negative for fracture or metastatic disease. No lytic or sclerotic bone lesion identified. Paraspinal and other soft tissues: Extensive left lower lobe consolidation as noted previously. Progression of right lower lobe consolidation. Right upper lobe mass lesion approximately 2.4 cm is unchanged and may represent neoplasm. Calcified subcarinal lymph node unchanged. Disc levels: Disc spaces maintained without  significant degenerative change or spinal stenosis. IMPRESSION: 1. Negative for fracture or metastatic disease thoracic spine 2. Bibasilar consolidation with progression on the right. Possible pneumonia or tumor. 2.4 cm right upper lobe spiculated mass may represent metastatic disease or bronchogenic carcinoma. Electronically Signed   By: Franchot Gallo M.D.   On: 05/19/2018 17:16   Ct Lumbar Spine Wo Contrast  Result Date: 05/19/2018 CLINICAL DATA:  Fall 1 week ago. Back pain. History of metastatic breast cancer. EXAM: CT LUMBAR SPINE WITHOUT CONTRAST TECHNIQUE: Multidetector CT imaging of the lumbar spine was performed without intravenous contrast administration. Multiplanar CT image reconstructions were also generated. COMPARISON:  CT abdomen pelvis 04/08/2018 FINDINGS: Segmentation: Normal Alignment: Normal Vertebrae: Negative for lumbar fracture. 3 mm sclerotic lesion L3 vertebral body. This has enlarged slightly from 03/11/2018 therefore is suspicious for metastatic disease. No other sclerotic or lytic lesions. Paraspinal and other soft tissues: Negative for paraspinous mass or adenopathy. Mild atherosclerotic aorta. Disc levels: Mild lumbar disc and facet degeneration. Negative for disc protrusion or stenosis. IMPRESSION: 1. Negative for lumbar fracture 2. 3 mm sclerotic lesion L4 vertebral body likely due to metastatic disease. This has enlarged since 03/21/2018. Electronically Signed   By: Franchot Gallo M.D.   On: 05/19/2018 17:23   Ct Abdomen Pelvis W Contrast  Result Date: 05/19/2018 CLINICAL DATA:  Blunt abdominal trauma. EXAM: CT ABDOMEN AND PELVIS WITH CONTRAST TECHNIQUE: Multidetector CT imaging of the abdomen and pelvis was performed using the standard protocol following bolus administration of intravenous contrast. CONTRAST:  136m OMNIPAQUE IOHEXOL 300 MG/ML  SOLN COMPARISON:  CT scan of April 08, 2018. FINDINGS: Lower chest: Bilateral posterior basilar atelectasis or pneumonia is noted.  Hepatobiliary: No gallstones or biliary dilatation is noted. Intrahepatic metastatic lesions noted on prior exam are significantly smaller. 1.9 cm lesion is seen in peripheral portion of right hepatic lobe which is decreased compared to prior exam where it measured 2.9 cm. 1.5 cm left hepatic  lesion is noted which is significantly smaller compared to prior exam, where it measures 3.7 cm. Pancreas: Unremarkable. No pancreatic ductal dilatation or surrounding inflammatory changes. Spleen: Calcified splenic granulomata are noted. No other significant abnormality is noted. Adrenals/Urinary Tract: Adrenal glands are unremarkable. Kidneys are normal, without renal calculi, focal lesion, or hydronephrosis. Bladder is unremarkable. Stomach/Bowel: The stomach appears normal. There is no evidence of bowel obstruction or inflammation. The appendix is not clearly visualized. Vascular/Lymphatic: Aortic atherosclerosis. No enlarged abdominal or pelvic lymph nodes. Reproductive: Uterus and bilateral adnexa are unremarkable. Other: There is noted interval enlargement of the right iliacus muscle which is concerning for underlying hematoma. No hernia is noted. Musculoskeletal: No acute or significant osseous findings. IMPRESSION: Interval development of enlargement of right iliacus muscle with surrounding soft tissue stranding concerning for underlying hematoma or possibly severe muscular injury. Hepatic metastatic lesions noted on prior exam is significantly smaller currently. Bilateral posterior basilar atelectasis or pneumonia is noted. Aortic Atherosclerosis (ICD10-I70.0). Electronically Signed   By: Marijo Conception M.D.   On: 05/19/2018 17:17   Dg Pelvis Portable  Result Date: 05/19/2018 CLINICAL DATA:  58 year old female with a fall a few years ago with bilateral hip pain EXAM: PORTABLE PELVIS 1-2 VIEWS COMPARISON:  None. FINDINGS: Bony pelvic ring intact, with no acute displaced fracture. Bilateral hips projects normally  over the acetabula. Mild degenerative changes bilateral hips. Unremarkable proximal femurs. IMPRESSION: Negative for acute bony abnormality. Early bilateral hip osteoarthritis Electronically Signed   By: Corrie Mckusick D.O.   On: 05/19/2018 12:34   Dg Chest Port 1 View  Result Date: 05/19/2018 CLINICAL DATA:  Fall last Tuesday. Pain in both hips and legs. Fever. EXAM: PORTABLE CHEST 1 VIEW COMPARISON:  May 05, 2018 FINDINGS: The right Port-A-Cath terminates in the central SVC. There is mild opacity in left base, significantly improved since May 05, 2018. Most of the right-sided infiltrate is also resolved. Minimal opacity remains in the medial right upper lobe. No other interval changes. IMPRESSION: Mild opacities remain in the medial right upper lobe in the left base, significantly improved in the interval. The remainder of infiltrates previously seen have resolved. Recommend follow-up to complete resolution. Electronically Signed   By: Dorise Bullion III M.D   On: 05/19/2018 12:33   Dg Chest Port 1 View  Result Date: 05/01/2018 CLINICAL DATA:  Respiratory failure EXAM: PORTABLE CHEST 1 VIEW COMPARISON:  04/29/2018 FINDINGS: Right Port-A-Cath remains in place, unchanged. Bilateral airspace disease, worsening on the right since prior study. Layering effusions noted. Heart is borderline in size. IMPRESSION: Bilateral airspace opacities, worsening on the right since prior study. Bilateral layering effusions. Electronically Signed   By: Rolm Baptise M.D.   On: 05/01/2018 07:21    All questions were answered. The patient knows to call the clinic with any problems, questions or concerns. No barriers to learning was detected.  I spent 25 minutes counseling the patient face to face. The total time spent in the appointment was 30 minutes and more than 50% was on counseling and review of test results  [name], MD 05/27/2018 11:16 AM

## 2018-05-28 ENCOUNTER — Inpatient Hospital Stay: Payer: BLUE CROSS/BLUE SHIELD

## 2018-05-28 ENCOUNTER — Other Ambulatory Visit: Payer: Self-pay

## 2018-05-28 ENCOUNTER — Telehealth: Payer: Self-pay | Admitting: Hematology and Oncology

## 2018-05-28 VITALS — BP 105/67 | HR 94 | Temp 98.5°F | Resp 18

## 2018-05-28 DIAGNOSIS — C7951 Secondary malignant neoplasm of bone: Secondary | ICD-10-CM

## 2018-05-28 DIAGNOSIS — C7931 Secondary malignant neoplasm of brain: Secondary | ICD-10-CM

## 2018-05-28 DIAGNOSIS — C787 Secondary malignant neoplasm of liver and intrahepatic bile duct: Secondary | ICD-10-CM

## 2018-05-28 DIAGNOSIS — C50919 Malignant neoplasm of unspecified site of unspecified female breast: Secondary | ICD-10-CM

## 2018-05-28 DIAGNOSIS — Z5112 Encounter for antineoplastic immunotherapy: Secondary | ICD-10-CM | POA: Diagnosis not present

## 2018-05-28 MED ORDER — DEXAMETHASONE SODIUM PHOSPHATE 10 MG/ML IJ SOLN
10.0000 mg | Freq: Once | INTRAMUSCULAR | Status: AC
  Administered 2018-05-28: 10 mg via INTRAVENOUS

## 2018-05-28 MED ORDER — DIPHENHYDRAMINE HCL 25 MG PO CAPS
ORAL_CAPSULE | ORAL | Status: AC
  Filled 2018-05-28: qty 2

## 2018-05-28 MED ORDER — SODIUM CHLORIDE 0.9% FLUSH
10.0000 mL | INTRAVENOUS | Status: DC | PRN
  Administered 2018-05-28: 10 mL
  Filled 2018-05-28: qty 10

## 2018-05-28 MED ORDER — SODIUM CHLORIDE 0.9 % IV SOLN
Freq: Once | INTRAVENOUS | Status: AC
  Administered 2018-05-28: 08:00:00 via INTRAVENOUS
  Filled 2018-05-28: qty 250

## 2018-05-28 MED ORDER — SODIUM CHLORIDE 0.9 % IV SOLN
25.0000 mg/m2 | Freq: Once | INTRAVENOUS | Status: AC
  Administered 2018-05-28: 40 mg via INTRAVENOUS
  Filled 2018-05-28: qty 4

## 2018-05-28 MED ORDER — TRASTUZUMAB CHEMO 150 MG IV SOLR
6.0000 mg/kg | Freq: Once | INTRAVENOUS | Status: AC
  Administered 2018-05-28: 09:00:00 336 mg via INTRAVENOUS
  Filled 2018-05-28: qty 16

## 2018-05-28 MED ORDER — HEPARIN SOD (PORK) LOCK FLUSH 100 UNIT/ML IV SOLN
500.0000 [IU] | Freq: Once | INTRAVENOUS | Status: AC | PRN
  Administered 2018-05-28: 500 [IU]
  Filled 2018-05-28: qty 5

## 2018-05-28 MED ORDER — SODIUM CHLORIDE 0.9 % IV SOLN
420.0000 mg | Freq: Once | INTRAVENOUS | Status: AC
  Administered 2018-05-28: 10:00:00 420 mg via INTRAVENOUS
  Filled 2018-05-28: qty 14

## 2018-05-28 MED ORDER — ACETAMINOPHEN 325 MG PO TABS
ORAL_TABLET | ORAL | Status: AC
  Filled 2018-05-28: qty 2

## 2018-05-28 MED ORDER — ACETAMINOPHEN 325 MG PO TABS
650.0000 mg | ORAL_TABLET | Freq: Once | ORAL | Status: AC
  Administered 2018-05-28: 08:00:00 650 mg via ORAL

## 2018-05-28 MED ORDER — DIPHENHYDRAMINE HCL 25 MG PO CAPS
50.0000 mg | ORAL_CAPSULE | Freq: Once | ORAL | Status: AC
  Administered 2018-05-28: 08:00:00 50 mg via ORAL

## 2018-05-28 MED ORDER — DEXAMETHASONE SODIUM PHOSPHATE 10 MG/ML IJ SOLN
INTRAMUSCULAR | Status: AC
  Filled 2018-05-28: qty 1

## 2018-05-28 NOTE — Patient Instructions (Signed)
[facility] Discharge Instructions for Patients Receiving Chemotherapy  Today you received the following chemotherapy agents: trastuzumab (Herceptin), pertuzumab (Perjeta), docetaxel (Taxotere).   To help prevent nausea and vomiting after your treatment, we encourage you to take your nausea medication as prescribed by your physician.   If you develop nausea and vomiting that is not controlled by your nausea medication, call the clinic.   BELOW ARE SYMPTOMS THAT SHOULD BE REPORTED IMMEDIATELY:  *FEVER GREATER THAN 100.5 F  *CHILLS WITH OR WITHOUT FEVER  NAUSEA AND VOMITING THAT IS NOT CONTROLLED WITH YOUR NAUSEA MEDICATION  *UNUSUAL SHORTNESS OF BREATH  *UNUSUAL BRUISING OR BLEEDING  TENDERNESS IN MOUTH AND THROAT WITH OR WITHOUT PRESENCE OF ULCERS  *URINARY PROBLEMS  *BOWEL PROBLEMS  UNUSUAL RASH Items with * indicate a potential emergency and should be followed up as soon as possible.  Feel free to call the clinic should you have any questions or concerns. The clinic phone number is [phone].  Please show the [facility] at check-in to the Emergency Department and triage nurse.  Trastuzumab injection for infusion What is this medicine? TRASTUZUMAB (tras TOO zoo mab) is a monoclonal antibody. It is used to treat breast cancer and stomach cancer. This medicine may be used for other purposes; ask your health care provider or pharmacist if you have questions. COMMON BRAND NAME(S): Herceptin, Calla Kicks, OGIVRI What should I tell my health care provider before I take this medicine? They need to know if you have any of these conditions: -heart disease -heart failure -lung or breathing disease, like asthma -an unusual or allergic reaction to trastuzumab, benzyl alcohol, or other medications, foods, dyes, or preservatives -pregnant or trying to get pregnant -breast-feeding How should I use this medicine? This drug is given as an infusion into a vein. It  is administered in a hospital or clinic by a specially trained health care professional. Talk to your pediatrician regarding the use of this medicine in children. This medicine is not approved for use in children. Overdosage: If you think you have taken too much of this medicine contact a poison control center or emergency room at once. NOTE: This medicine is only for you. Do not share this medicine with others. What if I miss a dose? It is important not to miss a dose. Call your doctor or health care professional if you are unable to keep an appointment. What may interact with this medicine? This medicine may interact with the following medications: -certain types of chemotherapy, such as daunorubicin, doxorubicin, epirubicin, and idarubicin This list may not describe all possible interactions. Give your health care provider a list of all the medicines, herbs, non-prescription drugs, or dietary supplements you use. Also tell them if you smoke, drink alcohol, or use illegal drugs. Some items may interact with your medicine. What should I watch for while using this medicine? Visit your doctor for checks on your progress. Report any side effects. Continue your course of treatment even though you feel ill unless your doctor tells you to stop. Call your doctor or health care professional for advice if you get a fever, chills or sore throat, or other symptoms of a cold or flu. Do not treat yourself. Try to avoid being around people who are sick. You may experience fever, chills and shaking during your first infusion. These effects are usually mild and can be treated with other medicines. Report any side effects during the infusion to your health care professional. Fever and chills usually  do not happen with later infusions. Do not become pregnant while taking this medicine or for 7 months after stopping it. Women should inform their doctor if they wish to become pregnant or think they might be pregnant.  Women of child-bearing potential will need to have a negative pregnancy test before starting this medicine. There is a potential for serious side effects to an unborn child. Talk to your health care professional or pharmacist for more information. Do not breast-feed an infant while taking this medicine or for 7 months after stopping it. Women must use effective birth control with this medicine. What side effects may I notice from receiving this medicine? Side effects that you should report to your doctor or health care professional as soon as possible: -allergic reactions like skin rash, itching or hives, swelling of the face, lips, or tongue -chest pain or palpitations -cough -dizziness -feeling faint or lightheaded, falls -fever -general ill feeling or flu-like symptoms -signs of worsening heart failure like breathing problems; swelling in your legs and feet -unusually weak or tired Side effects that usually do not require medical attention (report to your doctor or health care professional if they continue or are bothersome): -bone pain -changes in taste -diarrhea -joint pain -nausea/vomiting -weight loss This list may not describe all possible side effects. Call your doctor for medical advice about side effects. You may report side effects to FDA at 1-800-FDA-1088. Where should I keep my medicine? This drug is given in a hospital or clinic and will not be stored at home. NOTE: This sheet is a summary. It may not cover all possible information. If you have questions about this medicine, talk to your doctor, pharmacist, or health care provider.  2019 Elsevier/Gold Standard (2016-01-10 14:37:52)  Pertuzumab injection What is this medicine? PERTUZUMAB (per TOOZ ue mab) is a monoclonal antibody. It is used to treat breast cancer. This medicine may be used for other purposes; ask your health care provider or pharmacist if you have questions. COMMON BRAND NAME(S): PERJETA What should I  tell my health care provider before I take this medicine? They need to know if you have any of these conditions: -heart disease -heart failure -high blood pressure -history of irregular heart beat -recent or ongoing radiation therapy -an unusual or allergic reaction to pertuzumab, other medicines, foods, dyes, or preservatives -pregnant or trying to get pregnant -breast-feeding How should I use this medicine? This medicine is for infusion into a vein. It is given by a health care professional in a hospital or clinic setting. Talk to your pediatrician regarding the use of this medicine in children. Special care may be needed. Overdosage: If you think you have taken too much of this medicine contact a poison control center or emergency room at once. NOTE: This medicine is only for you. Do not share this medicine with others. What if I miss a dose? It is important not to miss your dose. Call your doctor or health care professional if you are unable to keep an appointment. What may interact with this medicine? Interactions are not expected. Give your health care provider a list of all the medicines, herbs, non-prescription drugs, or dietary supplements you use. Also tell them if you smoke, drink alcohol, or use illegal drugs. Some items may interact with your medicine. This list may not describe all possible interactions. Give your health care provider a list of all the medicines, herbs, non-prescription drugs, or dietary supplements you use. Also tell them if you smoke, drink alcohol,  or use illegal drugs. Some items may interact with your medicine. What should I watch for while using this medicine? Your condition will be monitored carefully while you are receiving this medicine. Report any side effects. Continue your course of treatment even though you feel ill unless your doctor tells you to stop. Do not become pregnant while taking this medicine or for 7 months after stopping it. Women should  inform their doctor if they wish to become pregnant or think they might be pregnant. Women of child-bearing potential will need to have a negative pregnancy test before starting this medicine. There is a potential for serious side effects to an unborn child. Talk to your health care professional or pharmacist for more information. Do not breast-feed an infant while taking this medicine or for 7 months after stopping it. Women must use effective birth control with this medicine. Call your doctor or health care professional for advice if you get a fever, chills or sore throat, or other symptoms of a cold or flu. Do not treat yourself. Try to avoid being around people who are sick. You may experience fever, chills, and headache during the infusion. Report any side effects during the infusion to your health care professional. What side effects may I notice from receiving this medicine? Side effects that you should report to your doctor or health care professional as soon as possible: -breathing problems -chest pain or palpitations -dizziness -feeling faint or lightheaded -fever or chills -skin rash, itching or hives -sore throat -swelling of the face, lips, or tongue -swelling of the legs or ankles -unusually weak or tired Side effects that usually do not require medical attention (report to your doctor or health care professional if they continue or are bothersome): -diarrhea -hair loss -nausea, vomiting -tiredness This list may not describe all possible side effects. Call your doctor for medical advice about side effects. You may report side effects to FDA at 1-800-FDA-1088. Where should I keep my medicine? This drug is given in a hospital or clinic and will not be stored at home. NOTE: This sheet is a summary. It may not cover all possible information. If you have questions about this medicine, talk to your doctor, pharmacist, or health care provider.  2019 Elsevier/Gold Standard (2015-02-17  12:08:50)  Docetaxel injection What is this medicine? DOCETAXEL (doe se TAX el) is a chemotherapy drug. It targets fast dividing cells, like cancer cells, and causes these cells to die. This medicine is used to treat many types of cancers like breast cancer, certain stomach cancers, head and neck cancer, lung cancer, and prostate cancer. This medicine may be used for other purposes; ask your health care provider or pharmacist if you have questions. COMMON BRAND NAME(S): Docefrez, Taxotere What should I tell my health care provider before I take this medicine? They need to know if you have any of these conditions: -infection (especially a virus infection such as chickenpox, cold sores, or herpes) -liver disease -low blood counts, like low white cell, platelet, or red cell counts -an unusual or allergic reaction to docetaxel, polysorbate 80, other chemotherapy agents, other medicines, foods, dyes, or preservatives -pregnant or trying to get pregnant -breast-feeding How should I use this medicine? This drug is given as an infusion into a vein. It is administered in a hospital or clinic by a specially trained health care professional. Talk to your pediatrician regarding the use of this medicine in children. Special care may be needed. Overdosage: If you think you have taken too  much of this medicine contact a poison control center or emergency room at once. NOTE: This medicine is only for you. Do not share this medicine with others. What if I miss a dose? It is important not to miss your dose. Call your doctor or health care professional if you are unable to keep an appointment. What may interact with this medicine? -cyclosporine -erythromycin -ketoconazole -medicines to increase blood counts like filgrastim, pegfilgrastim, sargramostim -vaccines Talk to your doctor or health care professional before taking any of these  medicines: -acetaminophen -aspirin -ibuprofen -ketoprofen -naproxen This list may not describe all possible interactions. Give your health care provider a list of all the medicines, herbs, non-prescription drugs, or dietary supplements you use. Also tell them if you smoke, drink alcohol, or use illegal drugs. Some items may interact with your medicine. What should I watch for while using this medicine? Your condition will be monitored carefully while you are receiving this medicine. You will need important blood work done while you are taking this medicine. This drug may make you feel generally unwell. This is not uncommon, as chemotherapy can affect healthy cells as well as cancer cells. Report any side effects. Continue your course of treatment even though you feel ill unless your doctor tells you to stop. In some cases, you may be given additional medicines to help with side effects. Follow all directions for their use. Call your doctor or health care professional for advice if you get a fever, chills or sore throat, or other symptoms of a cold or flu. Do not treat yourself. This drug decreases your body's ability to fight infections. Try to avoid being around people who are sick. This medicine may increase your risk to bruise or bleed. Call your doctor or health care professional if you notice any unusual bleeding. This medicine may contain alcohol in the product. You may get drowsy or dizzy. Do not drive, use machinery, or do anything that needs mental alertness until you know how this medicine affects you. Do not stand or sit up quickly, especially if you are an older patient. This reduces the risk of dizzy or fainting spells. Avoid alcoholic drinks. Do not become pregnant while taking this medicine or for 6 months after stopping it. Women should inform their doctor if they wish to become pregnant or think they might be pregnant. Men should not father a child while taking this medicine and for 3  months after stopping it. There is a potential for serious side effects to an unborn child. Talk to your health care professional or pharmacist for more information. Do not breast-feed an infant while taking this medicine or for 2 weeks after stopping it. This may interfere with the ability to father a child. You should talk to your doctor or health care professional if you are concerned about your fertility. What side effects may I notice from receiving this medicine? Side effects that you should report to your doctor or health care professional as soon as possible: -allergic reactions like skin rash, itching or hives, swelling of the face, lips, or tongue -low blood counts - This drug may decrease the number of white blood cells, red blood cells and platelets. You may be at increased risk for infections and bleeding. -signs of infection - fever or chills, cough, sore throat, pain or difficulty passing urine -signs of decreased platelets or bleeding - bruising, pinpoint red spots on the skin, black, tarry stools, nosebleeds -signs of decreased red blood cells - unusually weak  or tired, fainting spells, lightheadedness -breathing problems -fast or irregular heartbeat -low blood pressure -mouth sores -nausea and vomiting -pain, swelling, redness or irritation at the injection site -pain, tingling, numbness in the hands or feet -swelling of the ankle, feet, hands -weight gain Side effects that usually do not require medical attention (report to your doctor or health care professional if they continue or are bothersome): -bone pain -complete hair loss including hair on your head, underarms, pubic hair, eyebrows, and eyelashes -diarrhea -excessive tearing -changes in the color of fingernails -loosening of the fingernails -nausea -muscle pain -red flush to skin -sweating -weak or tired This list may not describe all possible side effects. Call your doctor for medical advice about side  effects. You may report side effects to FDA at 1-800-FDA-1088. Where should I keep my medicine? This drug is given in a hospital or clinic and will not be stored at home. NOTE: This sheet is a summary. It may not cover all possible information. If you have questions about this medicine, talk to your doctor, pharmacist, or health care provider.  2019 Elsevier/Gold Standard (2017-02-11 12:07:21)   Coronavirus (COVID-19) Are you at risk?  Are you at risk for the Coronavirus (COVID-19)?  To be considered HIGH RISK for Coronavirus (COVID-19), you have to meet the following criteria:  . Traveled to [place], [place], [place], [place] or [place]; or in the [place] to [place], [place], [place], or [place]; and have fever, cough, and shortness of breath within the last 2 weeks of travel OR . Been in close contact with a person diagnosed with COVID-19 within the last 2 weeks and have fever, cough, and shortness of breath . IF YOU DO NOT MEET THESE CRITERIA, YOU ARE CONSIDERED LOW RISK FOR COVID-19.  What to do if you are HIGH RISK for COVID-19?  Marland Kitchen If you are having a medical emergency, call 911. . Seek medical care right away. Before you go to a doctor's office, urgent care or emergency department, call ahead and tell them about your recent travel, contact with someone diagnosed with COVID-19, and your symptoms. You should receive instructions from your physician's office regarding next steps of care.  . When you arrive at healthcare provider, tell the healthcare staff immediately you have returned from visiting [place], [place], [place], [place] or [place]; or traveled in the [place] to [place], [place], [place], or [place]; in the last two weeks or you have been in close contact with a person diagnosed with COVID-19 in the last 2 weeks.   . Tell the health care staff about your symptoms: fever, cough and shortness of breath. . After you have been seen by a medical provider, you  will be either: o Tested for (COVID-19) and discharged home on quarantine except to seek medical care if symptoms worsen, and asked to  - Stay home and avoid contact with others until you get your results (4-5 days)  - Avoid travel on public transportation if possible (such as bus, train, or airplane) or o Sent to the Emergency Department by EMS for evaluation, COVID-19 testing, and possible admission depending on your condition and test results.  What to do if you are LOW RISK for COVID-19?  Reduce your risk of any infection by using the same precautions used for avoiding the common cold or flu:  Marland Kitchen Wash your hands often with soap and warm water for at least 20 seconds.  If soap and water are not readily  available, use an alcohol-based hand sanitizer with at least 60% alcohol.  . If coughing or sneezing, cover your mouth and nose by coughing or sneezing into the elbow areas of your shirt or coat, into a tissue or into your sleeve (not your hands). . Avoid shaking hands with others and consider head nods or verbal greetings only. . Avoid touching your eyes, nose, or mouth with unwashed hands.  . Avoid close contact with people who are sick. . Avoid places or events with large numbers of people in one location, like concerts or sporting events. . Carefully consider travel plans you have or are making. . If you are planning any travel outside or inside the [place], visit the CDC's Travelers' Health webpage for the latest health notices. . If you have some symptoms but not all symptoms, continue to monitor at home and seek medical attention if your symptoms worsen. . If you are having a medical emergency, call 911.   [place] / e-Visit: [email or web address]         [facility]: [facility]: [phone]                   [facility]: [phone]

## 2018-05-28 NOTE — Progress Notes (Signed)
Per Dr. Alvy Bimler OK to treat with tachycardia and thrombocytopenia

## 2018-05-28 NOTE — Progress Notes (Signed)
No reloading of Herceptin/Perjeta per Dr. Alvy Bimler. Kennith [place], Pharm.D., CPP 05/28/2018@8 :17 AM

## 2018-05-28 NOTE — Telephone Encounter (Signed)
Scheduled appt per 4/28 sch message - pt to get an updated schedule in the treatment area.

## 2018-05-29 ENCOUNTER — Inpatient Hospital Stay: Payer: BLUE CROSS/BLUE SHIELD | Admitting: Nutrition

## 2018-05-29 NOTE — Progress Notes (Signed)
RD working remotely.  Patient was identified to be at risk for malnutrition secondary to wt loss and poor appetite.  58 year old female diagnosed with metastatic breast cancer. She is a patient of Dr. Alvy Bimler.  PMH includes DM and Asthma.  Medications include Nexium, novolog, levemir, Ativan, MVI.  Labs include Glucose 151 and Albumin 2.2.  Height: 5'2". Weight: 136 pounds. UBW: 157 pounds. BMI: 24.69.  Patient lost 13% weight in ~3 weeks secondary to decreased appetite and taste alterations. She reports she is eating much better. She is eating a lot of protein foods.  Nutrition Diagnosis: Unintended weight loss related to metastatic cancer and associated treatments as evidenced by 13% body weight lost over ~3 weeks which is significant.  Intervention: Educated patient on small frequent meals and snacks with higher calorie and higher protein foods Educated on strategies for improving taste alterations. Questions answered. Fact sheets emailed to patient.  Monitoring, Evaluation, Goals: Increase oral intake to minimize further weight loss.  Next Visit: To be scheduled as needed. Patient has my contact information.

## 2018-06-04 ENCOUNTER — Telehealth: Payer: Self-pay | Admitting: Hematology and Oncology

## 2018-06-04 ENCOUNTER — Encounter: Payer: Self-pay | Admitting: Hematology and Oncology

## 2018-06-04 ENCOUNTER — Inpatient Hospital Stay: Payer: BLUE CROSS/BLUE SHIELD | Attending: Hematology and Oncology | Admitting: Hematology and Oncology

## 2018-06-04 ENCOUNTER — Inpatient Hospital Stay: Payer: BLUE CROSS/BLUE SHIELD

## 2018-06-04 ENCOUNTER — Other Ambulatory Visit: Payer: Self-pay

## 2018-06-04 VITALS — BP 117/59 | HR 107 | Temp 98.6°F | Resp 18 | Ht 62.0 in | Wt 132.5 lb

## 2018-06-04 DIAGNOSIS — Z171 Estrogen receptor negative status [ER-]: Secondary | ICD-10-CM | POA: Diagnosis not present

## 2018-06-04 DIAGNOSIS — C7931 Secondary malignant neoplasm of brain: Secondary | ICD-10-CM

## 2018-06-04 DIAGNOSIS — Z79899 Other long term (current) drug therapy: Secondary | ICD-10-CM

## 2018-06-04 DIAGNOSIS — D61818 Other pancytopenia: Secondary | ICD-10-CM | POA: Insufficient documentation

## 2018-06-04 DIAGNOSIS — E440 Moderate protein-calorie malnutrition: Secondary | ICD-10-CM

## 2018-06-04 DIAGNOSIS — C50919 Malignant neoplasm of unspecified site of unspecified female breast: Secondary | ICD-10-CM

## 2018-06-04 DIAGNOSIS — E1165 Type 2 diabetes mellitus with hyperglycemia: Secondary | ICD-10-CM

## 2018-06-04 DIAGNOSIS — C50312 Malignant neoplasm of lower-inner quadrant of left female breast: Secondary | ICD-10-CM

## 2018-06-04 DIAGNOSIS — D6481 Anemia due to antineoplastic chemotherapy: Secondary | ICD-10-CM | POA: Diagnosis not present

## 2018-06-04 DIAGNOSIS — C787 Secondary malignant neoplasm of liver and intrahepatic bile duct: Secondary | ICD-10-CM

## 2018-06-04 DIAGNOSIS — N179 Acute kidney failure, unspecified: Secondary | ICD-10-CM | POA: Insufficient documentation

## 2018-06-04 DIAGNOSIS — R112 Nausea with vomiting, unspecified: Secondary | ICD-10-CM | POA: Diagnosis not present

## 2018-06-04 DIAGNOSIS — K5909 Other constipation: Secondary | ICD-10-CM

## 2018-06-04 DIAGNOSIS — C7800 Secondary malignant neoplasm of unspecified lung: Secondary | ICD-10-CM | POA: Diagnosis not present

## 2018-06-04 DIAGNOSIS — C7951 Secondary malignant neoplasm of bone: Secondary | ICD-10-CM | POA: Insufficient documentation

## 2018-06-04 DIAGNOSIS — Z7951 Long term (current) use of inhaled steroids: Secondary | ICD-10-CM

## 2018-06-04 DIAGNOSIS — I471 Supraventricular tachycardia: Secondary | ICD-10-CM | POA: Insufficient documentation

## 2018-06-04 DIAGNOSIS — Z794 Long term (current) use of insulin: Secondary | ICD-10-CM

## 2018-06-04 DIAGNOSIS — Z5111 Encounter for antineoplastic chemotherapy: Secondary | ICD-10-CM

## 2018-06-04 DIAGNOSIS — M549 Dorsalgia, unspecified: Secondary | ICD-10-CM | POA: Insufficient documentation

## 2018-06-04 DIAGNOSIS — C7801 Secondary malignant neoplasm of right lung: Secondary | ICD-10-CM

## 2018-06-04 LAB — CMP (CANCER CENTER ONLY)
ALT: 19 U/L (ref 0–44)
AST: 21 U/L (ref 15–41)
Albumin: 3.1 g/dL — ABNORMAL LOW (ref 3.5–5.0)
Alkaline Phosphatase: 121 U/L (ref 38–126)
Anion gap: 12 (ref 5–15)
BUN: 24 mg/dL — ABNORMAL HIGH (ref 6–20)
CO2: 28 mmol/L (ref 22–32)
Calcium: 9.6 mg/dL (ref 8.9–10.3)
Chloride: 101 mmol/L (ref 98–111)
Creatinine: 0.74 mg/dL (ref 0.44–1.00)
GFR, Est AFR Am: >60 mL/min (ref >60)
GFR, Estimated: >60 mL/min (ref >60)
Glucose, Bld: 85 mg/dL (ref 70–99)
Potassium: 4.5 mmol/L (ref 3.5–5.1)
Sodium: 141 mmol/L (ref 135–145)
Total Bilirubin: 0.6 mg/dL (ref 0.3–1.2)
Total Protein: 7.7 g/dL (ref 6.5–8.1)

## 2018-06-04 LAB — CBC WITH DIFFERENTIAL (CANCER CENTER ONLY)
Abs Immature Granulocytes: 0.67 10*3/uL — ABNORMAL HIGH (ref 0.00–0.07)
Basophils Absolute: 0.5 10*3/uL — ABNORMAL HIGH (ref 0.0–0.1)
Basophils Relative: 7 %
Eosinophils Absolute: 0 10*3/uL (ref 0.0–0.5)
Eosinophils Relative: 0 %
HCT: 28.3 % — ABNORMAL LOW (ref 36.0–46.0)
Hemoglobin: 8.8 g/dL — ABNORMAL LOW (ref 12.0–15.0)
Immature Granulocytes: 8 %
Lymphocytes Relative: 25 %
Lymphs Abs: 2 10*3/uL (ref 0.7–4.0)
MCH: 27.3 pg (ref 26.0–34.0)
MCHC: 31.1 g/dL (ref 30.0–36.0)
MCV: 87.9 fL (ref 80.0–100.0)
Monocytes Absolute: 1.2 10*3/uL — ABNORMAL HIGH (ref 0.1–1.0)
Monocytes Relative: 15 %
Neutro Abs: 3.6 10*3/uL (ref 1.7–7.7)
Neutrophils Relative %: 45 %
Platelet Count: 57 10*3/uL — ABNORMAL LOW (ref 150–400)
RBC: 3.22 MIL/uL — ABNORMAL LOW (ref 3.87–5.11)
RDW: 17 % — ABNORMAL HIGH (ref 11.5–15.5)
WBC Count: 8 10*3/uL (ref 4.0–10.5)
nRBC: 0 % (ref 0.0–0.2)

## 2018-06-04 LAB — SAMPLE TO BLOOD BANK

## 2018-06-04 MED ORDER — INSULIN ASPART 100 UNIT/ML FLEXPEN: 8.0000 [IU] | PEN_INJECTOR | Freq: Three times a day (TID) | SUBCUTANEOUS | 11 refills | Status: AC

## 2018-06-04 NOTE — Assessment & Plan Note (Signed)
Her blood sugar control is excellent I refilled her insulin prescription I recommend resumption of metformin

## 2018-06-04 NOTE — Progress Notes (Signed)
[name] OFFICE PROGRESS NOTE  Patient Care Team: Robyne Peers, MD as PCP - General (Family Medicine)  ASSESSMENT & PLAN:  Metastatic breast cancer [facility]) She tolerated recent reduced dose chemotherapy well Overall, she has responded well to treatment She has MRI scheduled to follow-up on brain lesion We will continue aggressive supportive care I will see her next week for further management  Metastasis to liver [facility]) Her recent CT showed resolution of liver metastasis Her liver function is back to normal  Metastasis to lung [facility]) She denies cough or shortness of breath Her oxygen saturation without supplemental oxygen is within normal limits I recommend discontinuation of home oxygen at night  Metastasis to brain [facility]) She has no recent headache or seizure MRI is pending I recommend her to wait until after MRI result before returning back to work  Malnutrition of moderate degree She is eating better with improved energy and appetite Her albumin is improving She will continue to eat as much as she can  Uncontrolled diabetes mellitus with hyperglycemia ([place]) Her blood sugar control is excellent I refilled her insulin prescription I recommend resumption of metformin  Other constipation Her constipation has improved with daily laxative I recommend reducing the dose of MiraLAX to half with plan to discontinue in the future   Orders Placed This Encounter  Procedures  . ECHOCARDIOGRAM COMPLETE    Standing Status:   Future    Standing Expiration Date:   09/04/2019    Order Specific Question:   Where should this test be performed    Answer:   [facility]    Order Specific Question:   Perflutren DEFINITY (image enhancing agent) should be administered unless hypersensitivity or allergy exist    Answer:   Administer Perflutren    Order Specific Question:   Reason for exam-Echo    Answer:   Chemo  V67.2 / Z09    INTERVAL HISTORY: Please see below for  problem oriented charting. She returns for further follow-up and supportive care Since last time I saw her, she felt better Even though she has lost some weight, overall she is eating well Her blood sugar is well controlled She has less pain and only take 1 pain medicine at night She does not feel out of breath without oxygen during daytime She denies cough, chest pain or shortness of breath She denies orthopnea No recent falls at home No recent headaches Her constipation has resolved She is wondering whether she can return back to work soon SUMMARY OF ONCOLOGIC HISTORY: Oncology History   Biopsy is ER negative Her2/neu positive     Metastatic breast cancer ([name])   03/11/2018 Imaging    Ct scan of chest, abdomen and pelvis 1. 2.6 x 2.3 cm spiculated mass identified inferomedial quadrant of the left breast. Lesion appears to retract the overlying skin. Mammographic correlation recommended. 2. Small lymph nodes in the left axillar ill-defined and suspicious for metastatic disease. 3. Multiple bilateral pulmonary nodules measuring up to 2.7 cm diameter. These probably represent metastatic disease. Given the dominant size of the central right upper lobe lesion, synchronous lung primary can not be completely excluded. 4. Multiple ill-defined liver lesions compatible with metastatic disease. Dominant liver metastases measure up to almost 4 cm. 5.  Aortic Atherosclerois (ICD10-170.0)     03/12/2018 Pathology Results    Liver, needle/core biopsy, left lobe - METASTATIC CARCINOMA. - LYMPHOVASCULAR INVASION IS IDENTIFIED. - SEE COMMENT. Microscopic Comment The tumor cells are positive for cytokeratin 7.  There is faint staining for GATA-3. There is non-specific staining for TTF-1. Cytokeratin 20, CDX-2, estrogen receptor, and GCDFP stains are negative. Given the clinical suspicion, the profile supports a primary breast carcinoma. Her2 will be performed and the results reported  separately.  By immunohistochemistry, the tumor cells are POSITIVE for Her2 (3+).    03/12/2018 Imaging    Single 1.3 x 1.4 cm RIGHT occipital metastasis.  Borderline pachymeningeal enhancement of symmetric nature could be related to the superficial metastasis or osseous disease.    03/12/2018 Procedure    Ultrasound-guided core biopsy performed of a mass within the lateral segment of the left lobe of the liver.    03/16/2018 Imaging    Right occipital lobe 13 x 16 mm. Small satellite enhancing nodule deep to the larger lesion. The larger lesion shows central necrosis and probable mild hemorrhage or calcification.  Mild dural enhancement is less impressive and could be within normal limits.  Bone marrow in the clivus and cervical spine is diffusely low signal. No focal lesion. This may be related to the patient's anemia and abnormal blood count.    03/17/2018 Cancer Staging    Staging form: Breast, AJCC 8th Edition - Clinical: Stage IV (cT2, cN0, pM1, GX, ER-, PR: Not Assessed, HER2+) - Signed by [name], MD on 03/17/2018    03/19/2018 -  Chemotherapy    She received chemo with Taxotere, Herceptin and Perjeta    03/21/2018 Echocardiogram    IMPRESSIONS 1. The left ventricle has normal systolic function with an ejection fraction of 60-65%. The cavity size was normal. Left ventricular diastolic Doppler parameters are consistent with impaired relaxation.  2. The right ventricle has normal systolic function. The cavity was normal. There is no increase in right ventricular wall thickness.  3. The mitral valve is normal in structure.  4. The tricuspid valve is normal in structure.  5. Strain imaging performed but not reported due to interpreter judgement, secondary to suboptimal image quality.    03/24/2018 Procedure    Successful placement of a right IJ approach Power Port with ultrasound and fluoroscopic guidance. The catheter is ready for use.    04/07/2018 - 04/16/2018 [facility]  Admission    She was admitted for pneumonia    04/07/2018 - 04/16/2018 [facility] Admission    She was admitted to the [facility] for pneumonia and respiratory failure    04/25/2018 Imaging    Retropharyngeal fluid collection compatible with effusion or possibly abscess. Soft tissue thickening extends into the right neck and surrounds the right carotid bifurcation and right internal carotid artery. There is also streaky density in the right lateral neck soft tissues with a focal 7.6 Mm lymph node in the right lateral neck. Stranding surrounds the right submandibular gland.   Findings are most compatible with infection. Favor pharyngitis with retropharyngeal effusion/abscess. Soft tissue thickening around the right carotid most compatible with infection rather than tumor. Right jugular vein is patent.  Multiple nodular densities in the right upper lobe may represent residual pneumonia or metastatic disease. Interval improvement in right upper lobe infiltrate compared with CT of 04/08/2018    04/25/2018 - 05/06/2018 [facility] Admission    She was admitted for retropharyngeal abscess    04/27/2018 Imaging    No evidence of large central pulmonary embolus is noted in the main pulmonary artery or the main portions of the right and left pulmonary arteries. However, evaluation of the lower lobe branches, particularly on the right, is limited due to respiratory motion artifact  and other limiting issues. Pulmonary emboli and smaller peripheral branches can not be excluded on the basis of this exam.  Large left pleural effusion is noted with complete atelectasis of the left lower lobe. Increased left upper lobe opacity is noted posteriorly concerning for atelectasis or pneumonia.  Improved right upper lobe and lower lobe opacities are noted suggesting improving pneumonia or atelectasis.  15 mm right paratracheal lymph node is noted which is enlarged compared to prior exam; it is uncertain if this is metastatic  or inflammatory in etiology.  Hepatic metastatic lesions are again noted.  Aortic Atherosclerosis (ICD10-I70.0).     04/27/2018 Procedure    Successful ultrasound guided left thoracentesis yielding 120 mL of blood-tinged of pleural fluid.     Metastasis to brain ([place])   03/17/2018 Initial Diagnosis    Metastasis to brain [facility]     Metastasis to liver ([place])   03/17/2018 Initial Diagnosis    Metastasis to liver [facility])     Metastasis to bone ([place])   03/17/2018 Initial Diagnosis    Metastasis to bone ([facility])     REVIEW OF SYSTEMS:   Constitutional: Denies fevers, chills  Eyes: Denies blurriness of vision Ears, nose, mouth, throat, and face: Denies mucositis or sore throat Respiratory: Denies cough, dyspnea or wheezes Cardiovascular: Denies palpitation, chest discomfort or lower extremity swelling Gastrointestinal:  Denies nausea, heartburn or change in bowel habits Skin: Denies abnormal skin rashes Lymphatics: Denies new lymphadenopathy or easy bruising Neurological:Denies numbness, tingling or new weaknesses Behavioral/Psych: Mood is stable, no new changes  All other systems were reviewed with the patient and are negative.  I have reviewed the past medical history, past surgical history, social history and family history with the patient and they are unchanged from previous note.  ALLERGIES:  is allergic to nsaids.  MEDICATIONS:  Current Outpatient Medications  Medication Sig Dispense Refill  . metFORMIN (GLUCOPHAGE) 500 MG tablet Take 500 mg by mouth daily.    . ondansetron (ZOFRAN) 8 MG tablet Take 8 mg by mouth every 8 (eight) hours as needed.    . prochlorperazine (COMPAZINE) 10 MG tablet Take 10 mg by mouth every 6 (six) hours as needed.    Marland Kitchen ACCU-CHEK AVIVA PLUS test strip     . ACCU-CHEK SOFTCLIX LANCETS lancets     . albuterol (PROAIR HFA) 108 (90 Base) MCG/ACT inhaler Inhale 2 puffs into the lungs every 6 (six) hours as needed for wheezing or shortness of breath.  1 Inhaler 1  . blood glucose meter kit and supplies KIT Dispense based on patient and insurance preference. Use up to four times daily as directed. (FOR ICD-9 250.00, 250.01). 1 each 0  . esomeprazole (NEXIUM) 40 MG capsule Take 1 capsule (40 mg total) by mouth 2 (two) times daily before a meal. 180 capsule 1  . fluticasone (FLOVENT HFA) 44 MCG/ACT inhaler Inhale 2 puffs into the lungs 2 (two) times daily. 3 Inhaler 1  . insulin aspart (NOVOLOG) 100 UNIT/ML FlexPen Inject 8 Units into the skin 3 (three) times daily with meals. 15 mL 11  . Insulin Detemir (LEVEMIR) 100 UNIT/ML Pen Inject 10 Units into the skin daily. (Patient taking differently: Inject 10 Units into the skin at bedtime. ) 15 mL 0  . Insulin Pen Needle 31G X 5 MM MISC 1 Device by Does not apply route QID. For use with insulin pens 100 each 0  . lidocaine-prilocaine (EMLA) cream Apply to affected area once (Patient taking differently: Apply 1 application topically  as needed (port access). Apply to affected area once) 30 g 3  . LORazepam (ATIVAN) 0.5 MG tablet Take 1 tab po 30 minutes prior to radiation or MRI (Patient taking differently: Take 0.5 mg by mouth See admin instructions. Take 30 minutes prior to radiation or MRI) 30 tablet 0  . metoprolol succinate (TOPROL-XL) 50 MG 24 hr tablet Take 50 mg by mouth every evening.    . montelukast (SINGULAIR) 10 MG tablet TAKE 1 TABLET BY MOUTH EVERYDAY AT BEDTIME (Patient taking differently: Take 10 mg by mouth daily. ) 90 tablet 0  . morphine (MSIR) 15 MG tablet Take 1 tablet (15 mg total) by mouth every 4 (four) hours as needed for severe pain. 60 tablet 0  . Multiple Vitamin (MULTIVITAMIN WITH MINERALS) TABS tablet Take 1 tablet by mouth daily.     No current facility-administered medications for this visit.    Facility-Administered Medications Ordered in Other Visits  Medication Dose Route Frequency Provider Last Rate Last Dose  . heparin lock flush 100 unit/mL  250 Units Intracatheter  PRN Mabry Tift, MD      . sodium chloride flush (NS) 0.9 % injection 3 mL  3 mL Intracatheter PRN Alvy Bimler, Jayvion Stefanski, MD        PHYSICAL EXAMINATION: ECOG PERFORMANCE STATUS: 1 - Symptomatic but completely ambulatory  Vitals:   06/04/18 0930  BP: (!) 117/59  Pulse: (!) 107  Resp: 18  Temp: 98.6 F (37 C)  SpO2: 99%   Filed Weights   06/04/18 0930  Weight: 132 lb 8 oz (60.1 kg)    GENERAL:alert, no distress and comfortable. She looks pale SKIN: skin color, texture, turgor are normal, no rashes or significant lesions EYES: normal, Conjunctiva are pink and non-injected, sclera clear OROPHARYNX:no exudate, no erythema and lips, buccal mucosa, and tongue normal  NECK: supple, thyroid normal size, non-tender, without nodularity LYMPH:  no palpable lymphadenopathy in the cervical, axillary or inguinal LUNGS: clear to auscultation and percussion with normal breathing effort HEART: mild tachycardia, regular rhythm and no murmurs and no lower extremity edema ABDOMEN:abdomen soft, non-tender and normal bowel sounds Musculoskeletal:no cyanosis of digits and no clubbing  NEURO: alert & oriented x 3 with fluent speech, no focal motor/sensory deficits  LABORATORY DATA:  I have reviewed the data as listed    Component Value Date/Time   NA 141 06/04/2018 0908   K 4.5 06/04/2018 0908   CL 101 06/04/2018 0908   CO2 28 06/04/2018 0908   GLUCOSE 85 06/04/2018 0908   BUN 24 (H) 06/04/2018 0908   CREATININE 0.74 06/04/2018 0908   CALCIUM 9.6 06/04/2018 0908   PROT 7.7 06/04/2018 0908   ALBUMIN 3.1 (L) 06/04/2018 0908   AST 21 06/04/2018 0908   ALT 19 06/04/2018 0908   ALKPHOS 121 06/04/2018 0908   BILITOT 0.6 06/04/2018 0908   GFRNONAA >60 06/04/2018 0908   GFRAA >60 06/04/2018 0908    No results found for: SPEP, UPEP  Lab Results  Component Value Date   WBC 8.0 06/04/2018   NEUTROABS 3.6 06/04/2018   HGB 8.8 (L) 06/04/2018   HCT 28.3 (L) 06/04/2018   MCV 87.9 06/04/2018   PLT 57  (L) 06/04/2018      Chemistry      Component Value Date/Time   NA 141 06/04/2018 0908   K 4.5 06/04/2018 0908   CL 101 06/04/2018 0908   CO2 28 06/04/2018 0908   BUN 24 (H) 06/04/2018 0908   CREATININE 0.74 06/04/2018 0908  Component Value Date/Time   CALCIUM 9.6 06/04/2018 0908   ALKPHOS 121 06/04/2018 0908   AST 21 06/04/2018 0908   ALT 19 06/04/2018 0908   BILITOT 0.6 06/04/2018 0908       RADIOGRAPHIC STUDIES: I have personally reviewed the radiological images as listed and agreed with the findings in the report. Ct Thoracic Spine Wo Contrast  Result Date: 05/19/2018 CLINICAL DATA:  Fall last week. Back pain and bilateral legs since fall. EXAM: CT THORACIC SPINE WITHOUT CONTRAST TECHNIQUE: Multidetector CT images of the thoracic were obtained using the standard protocol without intravenous contrast. COMPARISON:  CT chest 04/27/2018 FINDINGS: Alignment: Normal Vertebrae: Negative for fracture or metastatic disease. No lytic or sclerotic bone lesion identified. Paraspinal and other soft tissues: Extensive left lower lobe consolidation as noted previously. Progression of right lower lobe consolidation. Right upper lobe mass lesion approximately 2.4 cm is unchanged and may represent neoplasm. Calcified subcarinal lymph node unchanged. Disc levels: Disc spaces maintained without significant degenerative change or spinal stenosis. IMPRESSION: 1. Negative for fracture or metastatic disease thoracic spine 2. Bibasilar consolidation with progression on the right. Possible pneumonia or tumor. 2.4 cm right upper lobe spiculated mass may represent metastatic disease or bronchogenic carcinoma. Electronically Signed   By: Franchot Gallo M.D.   On: 05/19/2018 17:16   Ct Lumbar Spine Wo Contrast  Result Date: 05/19/2018 CLINICAL DATA:  Fall 1 week ago. Back pain. History of metastatic breast cancer. EXAM: CT LUMBAR SPINE WITHOUT CONTRAST TECHNIQUE: Multidetector CT imaging of the lumbar spine  was performed without intravenous contrast administration. Multiplanar CT image reconstructions were also generated. COMPARISON:  CT abdomen pelvis 04/08/2018 FINDINGS: Segmentation: Normal Alignment: Normal Vertebrae: Negative for lumbar fracture. 3 mm sclerotic lesion L3 vertebral body. This has enlarged slightly from 03/11/2018 therefore is suspicious for metastatic disease. No other sclerotic or lytic lesions. Paraspinal and other soft tissues: Negative for paraspinous mass or adenopathy. Mild atherosclerotic aorta. Disc levels: Mild lumbar disc and facet degeneration. Negative for disc protrusion or stenosis. IMPRESSION: 1. Negative for lumbar fracture 2. 3 mm sclerotic lesion L4 vertebral body likely due to metastatic disease. This has enlarged since 03/21/2018. Electronically Signed   By: Franchot Gallo M.D.   On: 05/19/2018 17:23   Ct Abdomen Pelvis W Contrast  Result Date: 05/19/2018 CLINICAL DATA:  Blunt abdominal trauma. EXAM: CT ABDOMEN AND PELVIS WITH CONTRAST TECHNIQUE: Multidetector CT imaging of the abdomen and pelvis was performed using the standard protocol following bolus administration of intravenous contrast. CONTRAST:  140m OMNIPAQUE IOHEXOL 300 MG/ML  SOLN COMPARISON:  CT scan of April 08, 2018. FINDINGS: Lower chest: Bilateral posterior basilar atelectasis or pneumonia is noted. Hepatobiliary: No gallstones or biliary dilatation is noted. Intrahepatic metastatic lesions noted on prior exam are significantly smaller. 1.9 cm lesion is seen in peripheral portion of right hepatic lobe which is decreased compared to prior exam where it measured 2.9 cm. 1.5 cm left hepatic lesion is noted which is significantly smaller compared to prior exam, where it measures 3.7 cm. Pancreas: Unremarkable. No pancreatic ductal dilatation or surrounding inflammatory changes. Spleen: Calcified splenic granulomata are noted. No other significant abnormality is noted. Adrenals/Urinary Tract: Adrenal glands are  unremarkable. Kidneys are normal, without renal calculi, focal lesion, or hydronephrosis. Bladder is unremarkable. Stomach/Bowel: The stomach appears normal. There is no evidence of bowel obstruction or inflammation. The appendix is not clearly visualized. Vascular/Lymphatic: Aortic atherosclerosis. No enlarged abdominal or pelvic lymph nodes. Reproductive: Uterus and bilateral adnexa are unremarkable. Other: There  is noted interval enlargement of the right iliacus muscle which is concerning for underlying hematoma. No hernia is noted. Musculoskeletal: No acute or significant osseous findings. IMPRESSION: Interval development of enlargement of right iliacus muscle with surrounding soft tissue stranding concerning for underlying hematoma or possibly severe muscular injury. Hepatic metastatic lesions noted on prior exam is significantly smaller currently. Bilateral posterior basilar atelectasis or pneumonia is noted. Aortic Atherosclerosis (ICD10-I70.0). Electronically Signed   By: Marijo Conception M.D.   On: 05/19/2018 17:17   Dg Pelvis Portable  Result Date: 05/19/2018 CLINICAL DATA:  58 year old female with a fall a few years ago with bilateral hip pain EXAM: PORTABLE PELVIS 1-2 VIEWS COMPARISON:  None. FINDINGS: Bony pelvic ring intact, with no acute displaced fracture. Bilateral hips projects normally over the acetabula. Mild degenerative changes bilateral hips. Unremarkable proximal femurs. IMPRESSION: Negative for acute bony abnormality. Early bilateral hip osteoarthritis Electronically Signed   By: Corrie Mckusick D.O.   On: 05/19/2018 12:34   Dg Chest Port 1 View  Result Date: 05/19/2018 CLINICAL DATA:  Fall last Tuesday. Pain in both hips and legs. Fever. EXAM: PORTABLE CHEST 1 VIEW COMPARISON:  May 05, 2018 FINDINGS: The right Port-A-Cath terminates in the central SVC. There is mild opacity in left base, significantly improved since May 05, 2018. Most of the right-sided infiltrate is also resolved.  Minimal opacity remains in the medial right upper lobe. No other interval changes. IMPRESSION: Mild opacities remain in the medial right upper lobe in the left base, significantly improved in the interval. The remainder of infiltrates previously seen have resolved. Recommend follow-up to complete resolution. Electronically Signed   By: Dorise Bullion III M.D   On: 05/19/2018 12:33    All questions were answered. The patient knows to call the clinic with any problems, questions or concerns. No barriers to learning was detected.  I spent 25 minutes counseling the patient face to face. The total time spent in the appointment was 30 minutes and more than 50% was on counseling and review of test results  [name], MD 06/04/2018 10:27 AM

## 2018-06-04 NOTE — Assessment & Plan Note (Signed)
Her recent CT showed resolution of liver metastasis Her liver function is back to normal

## 2018-06-04 NOTE — Assessment & Plan Note (Signed)
She tolerated recent reduced dose chemotherapy well Overall, she has responded well to treatment She has MRI scheduled to follow-up on brain lesion We will continue aggressive supportive care I will see her next week for further management

## 2018-06-04 NOTE — Assessment & Plan Note (Signed)
Her constipation has improved with daily laxative I recommend reducing the dose of MiraLAX to half with plan to discontinue in the future

## 2018-06-04 NOTE — Assessment & Plan Note (Signed)
She denies cough or shortness of breath Her oxygen saturation without supplemental oxygen is within normal limits I recommend discontinuation of home oxygen at night

## 2018-06-04 NOTE — Assessment & Plan Note (Signed)
She has no recent headache or seizure MRI is pending I recommend her to wait until after MRI result before returning back to work

## 2018-06-04 NOTE — Telephone Encounter (Signed)
Scheduled appt per 5/06 sch message - pt is aware of appt date and time   

## 2018-06-04 NOTE — Assessment & Plan Note (Signed)
She is eating better with improved energy and appetite Her albumin is improving She will continue to eat as much as she can

## 2018-06-08 ENCOUNTER — Other Ambulatory Visit: Payer: Self-pay | Admitting: Oncology

## 2018-06-09 ENCOUNTER — Telehealth: Payer: Self-pay

## 2018-06-09 NOTE — Telephone Encounter (Signed)
Called and given below message to [facility], She verbalized understanding.

## 2018-06-09 NOTE — Telephone Encounter (Signed)
Pls extend Home Health orders for 60 more days If [facility] changes her mind about fluids/symptom management let us know

## 2018-06-09 NOTE — Telephone Encounter (Signed)
Called regarding after hours phone call for complaint of nausea/ vomiting. She vomited x 4 to 5 times yesterday. She is not feeling well today. She is still in bed and has not eating anything today. She is taking Zofran and Compazine. Offered appt today for IV fluids. She refused appt and said she does not feel like getting out of the house. [facility] coming out today around noon.  Above message given to Dr. Alvy Bimler. Called [place] back and offered appt today to see Sandi Mealy, PA and IV fluids. She declined offer of appt. She will think about and call back this afternoon if she would like appt for tomorrow for IV fluids. Instructed to push oral fluids and to not worry about eating. She verbalized understanding.

## 2018-06-09 NOTE — Telephone Encounter (Signed)
[name] nurse with [facility] called and left a message. She is seeing Brea in the home today, Her heart rate is 122 and she has a temperature of 100.9. Shalena is very weak. She just wanted to give update.  [name] is asking for verbal order to extend home health orders, the orders run out today.

## 2018-06-10 ENCOUNTER — Other Ambulatory Visit: Payer: Self-pay | Admitting: *Deleted

## 2018-06-10 DIAGNOSIS — J4540 Moderate persistent asthma, uncomplicated: Secondary | ICD-10-CM

## 2018-06-10 MED ORDER — MONTELUKAST SODIUM 10 MG PO TABS: 10.0000 mg | ORAL_TABLET | Freq: Every day | ORAL | 1 refills | Status: DC

## 2018-06-11 ENCOUNTER — Inpatient Hospital Stay (HOSPITAL_BASED_OUTPATIENT_CLINIC_OR_DEPARTMENT_OTHER): Payer: BLUE CROSS/BLUE SHIELD | Admitting: Hematology and Oncology

## 2018-06-11 ENCOUNTER — Telehealth: Payer: Self-pay | Admitting: Hematology and Oncology

## 2018-06-11 ENCOUNTER — Other Ambulatory Visit: Payer: Self-pay

## 2018-06-11 ENCOUNTER — Inpatient Hospital Stay (HOSPITAL_COMMUNITY): Payer: BLUE CROSS/BLUE SHIELD

## 2018-06-11 ENCOUNTER — Encounter: Payer: Self-pay | Admitting: Hematology and Oncology

## 2018-06-11 ENCOUNTER — Inpatient Hospital Stay: Payer: BLUE CROSS/BLUE SHIELD

## 2018-06-11 ENCOUNTER — Encounter (HOSPITAL_COMMUNITY): Payer: Self-pay | Admitting: Family Medicine

## 2018-06-11 ENCOUNTER — Inpatient Hospital Stay (HOSPITAL_COMMUNITY)
Admission: EM | Admit: 2018-06-11 | Discharge: 2018-06-22 | DRG: 871 | Disposition: A | Discharge status: Home Health | Payer: BLUE CROSS/BLUE SHIELD | Attending: Internal Medicine | Admitting: Internal Medicine

## 2018-06-11 ENCOUNTER — Other Ambulatory Visit: Payer: Self-pay | Admitting: Hematology and Oncology

## 2018-06-11 ENCOUNTER — Emergency Department (HOSPITAL_COMMUNITY): Payer: BLUE CROSS/BLUE SHIELD

## 2018-06-11 VITALS — BP 78/45 | HR 163 | Temp 99.4°F | Resp 44 | Ht 62.0 in

## 2018-06-11 DIAGNOSIS — D7589 Other specified diseases of blood and blood-forming organs: Secondary | ICD-10-CM | POA: Diagnosis present

## 2018-06-11 DIAGNOSIS — D6959 Other secondary thrombocytopenia: Secondary | ICD-10-CM | POA: Diagnosis present

## 2018-06-11 DIAGNOSIS — G7281 Critical illness myopathy: Secondary | ICD-10-CM | POA: Diagnosis present

## 2018-06-11 DIAGNOSIS — C50919 Malignant neoplasm of unspecified site of unspecified female breast: Secondary | ICD-10-CM

## 2018-06-11 DIAGNOSIS — C7931 Secondary malignant neoplasm of brain: Secondary | ICD-10-CM | POA: Diagnosis present

## 2018-06-11 DIAGNOSIS — D61818 Other pancytopenia: Secondary | ICD-10-CM

## 2018-06-11 DIAGNOSIS — C787 Secondary malignant neoplasm of liver and intrahepatic bile duct: Secondary | ICD-10-CM

## 2018-06-11 DIAGNOSIS — R6521 Severe sepsis with septic shock: Secondary | ICD-10-CM | POA: Diagnosis present

## 2018-06-11 DIAGNOSIS — C7951 Secondary malignant neoplasm of bone: Secondary | ICD-10-CM | POA: Diagnosis present

## 2018-06-11 DIAGNOSIS — Y95 Nosocomial condition: Secondary | ICD-10-CM | POA: Diagnosis present

## 2018-06-11 DIAGNOSIS — I5031 Acute diastolic (congestive) heart failure: Secondary | ICD-10-CM

## 2018-06-11 DIAGNOSIS — I700 Atherosclerosis of aorta: Secondary | ICD-10-CM | POA: Diagnosis present

## 2018-06-11 DIAGNOSIS — R0603 Acute respiratory distress: Secondary | ICD-10-CM

## 2018-06-11 DIAGNOSIS — D696 Thrombocytopenia, unspecified: Secondary | ICD-10-CM | POA: Diagnosis not present

## 2018-06-11 DIAGNOSIS — Z171 Estrogen receptor negative status [ER-]: Secondary | ICD-10-CM

## 2018-06-11 DIAGNOSIS — D6481 Anemia due to antineoplastic chemotherapy: Secondary | ICD-10-CM | POA: Diagnosis present

## 2018-06-11 DIAGNOSIS — Z7189 Other specified counseling: Secondary | ICD-10-CM

## 2018-06-11 DIAGNOSIS — I5033 Acute on chronic diastolic (congestive) heart failure: Secondary | ICD-10-CM | POA: Diagnosis present

## 2018-06-11 DIAGNOSIS — G893 Neoplasm related pain (acute) (chronic): Secondary | ICD-10-CM | POA: Diagnosis present

## 2018-06-11 DIAGNOSIS — L8990 Pressure ulcer of unspecified site, unspecified stage: Secondary | ICD-10-CM

## 2018-06-11 DIAGNOSIS — E43 Unspecified severe protein-calorie malnutrition: Secondary | ICD-10-CM | POA: Diagnosis present

## 2018-06-11 DIAGNOSIS — K1230 Oral mucositis (ulcerative), unspecified: Secondary | ICD-10-CM

## 2018-06-11 DIAGNOSIS — E876 Hypokalemia: Secondary | ICD-10-CM | POA: Diagnosis present

## 2018-06-11 DIAGNOSIS — Z853 Personal history of malignant neoplasm of breast: Secondary | ICD-10-CM

## 2018-06-11 DIAGNOSIS — C7800 Secondary malignant neoplasm of unspecified lung: Secondary | ICD-10-CM

## 2018-06-11 DIAGNOSIS — K219 Gastro-esophageal reflux disease without esophagitis: Secondary | ICD-10-CM | POA: Diagnosis present

## 2018-06-11 DIAGNOSIS — R64 Cachexia: Secondary | ICD-10-CM | POA: Diagnosis present

## 2018-06-11 DIAGNOSIS — J9621 Acute and chronic respiratory failure with hypoxia: Secondary | ICD-10-CM | POA: Diagnosis present

## 2018-06-11 DIAGNOSIS — N179 Acute kidney failure, unspecified: Secondary | ICD-10-CM

## 2018-06-11 DIAGNOSIS — I4891 Unspecified atrial fibrillation: Secondary | ICD-10-CM | POA: Diagnosis present

## 2018-06-11 DIAGNOSIS — R739 Hyperglycemia, unspecified: Secondary | ICD-10-CM

## 2018-06-11 DIAGNOSIS — D649 Anemia, unspecified: Secondary | ICD-10-CM | POA: Diagnosis not present

## 2018-06-11 DIAGNOSIS — T451X5A Adverse effect of antineoplastic and immunosuppressive drugs, initial encounter: Secondary | ICD-10-CM | POA: Diagnosis not present

## 2018-06-11 DIAGNOSIS — Z6823 Body mass index (BMI) 23.0-23.9, adult: Secondary | ICD-10-CM | POA: Diagnosis not present

## 2018-06-11 DIAGNOSIS — R112 Nausea with vomiting, unspecified: Secondary | ICD-10-CM

## 2018-06-11 DIAGNOSIS — Z79899 Other long term (current) drug therapy: Secondary | ICD-10-CM

## 2018-06-11 DIAGNOSIS — C50911 Malignant neoplasm of unspecified site of right female breast: Secondary | ICD-10-CM | POA: Diagnosis not present

## 2018-06-11 DIAGNOSIS — Z794 Long term (current) use of insulin: Secondary | ICD-10-CM

## 2018-06-11 DIAGNOSIS — C799 Secondary malignant neoplasm of unspecified site: Secondary | ICD-10-CM | POA: Diagnosis not present

## 2018-06-11 DIAGNOSIS — I340 Nonrheumatic mitral (valve) insufficiency: Secondary | ICD-10-CM | POA: Diagnosis not present

## 2018-06-11 DIAGNOSIS — F329 Major depressive disorder, single episode, unspecified: Secondary | ICD-10-CM | POA: Diagnosis present

## 2018-06-11 DIAGNOSIS — C50312 Malignant neoplasm of lower-inner quadrant of left female breast: Secondary | ICD-10-CM | POA: Diagnosis not present

## 2018-06-11 DIAGNOSIS — J4540 Moderate persistent asthma, uncomplicated: Secondary | ICD-10-CM | POA: Diagnosis present

## 2018-06-11 DIAGNOSIS — I959 Hypotension, unspecified: Secondary | ICD-10-CM | POA: Diagnosis not present

## 2018-06-11 DIAGNOSIS — I427 Cardiomyopathy due to drug and external agent: Secondary | ICD-10-CM | POA: Diagnosis present

## 2018-06-11 DIAGNOSIS — J9601 Acute respiratory failure with hypoxia: Secondary | ICD-10-CM | POA: Diagnosis not present

## 2018-06-11 DIAGNOSIS — I5032 Chronic diastolic (congestive) heart failure: Secondary | ICD-10-CM | POA: Diagnosis present

## 2018-06-11 DIAGNOSIS — Z7951 Long term (current) use of inhaled steroids: Secondary | ICD-10-CM

## 2018-06-11 DIAGNOSIS — A419 Sepsis, unspecified organism: Principal | ICD-10-CM | POA: Diagnosis present

## 2018-06-11 DIAGNOSIS — R000 Tachycardia, unspecified: Secondary | ICD-10-CM | POA: Diagnosis not present

## 2018-06-11 DIAGNOSIS — R0602 Shortness of breath: Secondary | ICD-10-CM

## 2018-06-11 DIAGNOSIS — L89156 Pressure-induced deep tissue damage of sacral region: Secondary | ICD-10-CM | POA: Diagnosis present

## 2018-06-11 DIAGNOSIS — E1165 Type 2 diabetes mellitus with hyperglycemia: Secondary | ICD-10-CM | POA: Diagnosis present

## 2018-06-11 DIAGNOSIS — I471 Supraventricular tachycardia: Secondary | ICD-10-CM | POA: Diagnosis not present

## 2018-06-11 DIAGNOSIS — J189 Pneumonia, unspecified organism: Secondary | ICD-10-CM | POA: Diagnosis present

## 2018-06-11 DIAGNOSIS — Z1159 Encounter for screening for other viral diseases: Secondary | ICD-10-CM | POA: Diagnosis not present

## 2018-06-11 DIAGNOSIS — Z515 Encounter for palliative care: Secondary | ICD-10-CM | POA: Diagnosis not present

## 2018-06-11 DIAGNOSIS — F419 Anxiety disorder, unspecified: Secondary | ICD-10-CM | POA: Diagnosis present

## 2018-06-11 DIAGNOSIS — Z9981 Dependence on supplemental oxygen: Secondary | ICD-10-CM

## 2018-06-11 DIAGNOSIS — E440 Moderate protein-calorie malnutrition: Secondary | ICD-10-CM

## 2018-06-11 DIAGNOSIS — E860 Dehydration: Secondary | ICD-10-CM | POA: Diagnosis present

## 2018-06-11 DIAGNOSIS — Z7289 Other problems related to lifestyle: Secondary | ICD-10-CM

## 2018-06-11 DIAGNOSIS — J302 Other seasonal allergic rhinitis: Secondary | ICD-10-CM | POA: Diagnosis present

## 2018-06-11 DIAGNOSIS — Z886 Allergy status to analgesic agent status: Secondary | ICD-10-CM

## 2018-06-11 DIAGNOSIS — C7801 Secondary malignant neoplasm of right lung: Secondary | ICD-10-CM | POA: Diagnosis not present

## 2018-06-11 DIAGNOSIS — Z6824 Body mass index (BMI) 24.0-24.9, adult: Secondary | ICD-10-CM | POA: Diagnosis not present

## 2018-06-11 DIAGNOSIS — I361 Nonrheumatic tricuspid (valve) insufficiency: Secondary | ICD-10-CM | POA: Diagnosis not present

## 2018-06-11 DIAGNOSIS — J181 Lobar pneumonia, unspecified organism: Secondary | ICD-10-CM | POA: Diagnosis not present

## 2018-06-11 DIAGNOSIS — L89616 Pressure-induced deep tissue damage of right heel: Secondary | ICD-10-CM | POA: Diagnosis present

## 2018-06-11 LAB — CALCIUM: Calcium: 8.2 mg/dL — ABNORMAL LOW (ref 8.9–10.3)

## 2018-06-11 LAB — CBG MONITORING, ED: Glucose-Capillary: 424 mg/dL — ABNORMAL HIGH (ref 70–99)

## 2018-06-11 LAB — CBC
HCT: 25.9 % — ABNORMAL LOW (ref 36.0–46.0)
Hemoglobin: 8.3 g/dL — ABNORMAL LOW (ref 12.0–15.0)
MCH: 27.9 pg (ref 26.0–34.0)
MCHC: 32 g/dL (ref 30.0–36.0)
MCV: 86.9 fL (ref 80.0–100.0)
Platelets: 40 10*3/uL — ABNORMAL LOW (ref 150–400)
RBC: 2.98 MIL/uL — ABNORMAL LOW (ref 3.87–5.11)
RDW: 15.3 % (ref 11.5–15.5)
WBC: 10.2 10*3/uL (ref 4.0–10.5)
nRBC: 1.1 % — ABNORMAL HIGH (ref 0.0–0.2)

## 2018-06-11 LAB — CBC WITH DIFFERENTIAL (CANCER CENTER ONLY)
Abs Immature Granulocytes: 2.94 K/uL — ABNORMAL HIGH (ref 0.00–0.07)
Basophils Absolute: 0.2 K/uL — ABNORMAL HIGH (ref 0.0–0.1)
Basophils Relative: 2 %
Eosinophils Absolute: 0 K/uL (ref 0.0–0.5)
Eosinophils Relative: 0 %
HCT: 20 % — ABNORMAL LOW (ref 36.0–46.0)
Hemoglobin: 6.2 g/dL — CL (ref 12.0–15.0)
Immature Granulocytes: 19 %
Lymphocytes Relative: 7 %
Lymphs Abs: 1.1 K/uL (ref 0.7–4.0)
MCH: 26.7 pg (ref 26.0–34.0)
MCHC: 31 g/dL (ref 30.0–36.0)
MCV: 86.2 fL (ref 80.0–100.0)
Monocytes Absolute: 0.9 K/uL (ref 0.1–1.0)
Monocytes Relative: 6 %
Neutro Abs: 10.2 K/uL — ABNORMAL HIGH (ref 1.7–7.7)
Neutrophils Relative %: 66 %
Platelet Count: 63 K/uL — ABNORMAL LOW (ref 150–400)
RBC: 2.32 MIL/uL — ABNORMAL LOW (ref 3.87–5.11)
RDW: 17.2 % — ABNORMAL HIGH (ref 11.5–15.5)
WBC Count: 15.3 K/uL — ABNORMAL HIGH (ref 4.0–10.5)
WBC Morphology: 3
nRBC: 0.7 % — ABNORMAL HIGH (ref 0.0–0.2)

## 2018-06-11 LAB — GLUCOSE, CAPILLARY
Glucose-Capillary: 383 mg/dL — ABNORMAL HIGH (ref 70–99)
Glucose-Capillary: 308 mg/dL — ABNORMAL HIGH (ref 70–99)
Glucose-Capillary: 110 mg/dL — ABNORMAL HIGH (ref 70–99)

## 2018-06-11 LAB — PHOSPHORUS: Phosphorus: 2.6 mg/dL (ref 2.5–4.6)

## 2018-06-11 LAB — LACTIC ACID, PLASMA
Lactic Acid, Venous: 2.3 mmol/L (ref 0.5–1.9)
Lactic Acid, Venous: 2.6 mmol/L (ref 0.5–1.9)

## 2018-06-11 LAB — BLOOD GAS, VENOUS
Acid-base deficit: 0.8 mmol/L (ref 0.0–2.0)
Bicarbonate: 23.5 mmol/L (ref 20.0–28.0)
O2 Saturation: 23.5 %
Patient temperature: 98.6
pCO2, Ven: 39.7 mmHg — ABNORMAL LOW (ref 44.0–60.0)
pH, Ven: 7.39 (ref 7.250–7.430)

## 2018-06-11 LAB — SAMPLE TO BLOOD BANK

## 2018-06-11 LAB — CMP (CANCER CENTER ONLY)
ALT: 13 U/L (ref 0–44)
AST: 11 U/L — ABNORMAL LOW (ref 15–41)
Albumin: 2.1 g/dL — ABNORMAL LOW (ref 3.5–5.0)
Alkaline Phosphatase: 137 U/L — ABNORMAL HIGH (ref 38–126)
Anion gap: 14 (ref 5–15)
BUN: 44 mg/dL — ABNORMAL HIGH (ref 6–20)
CO2: 23 mmol/L (ref 22–32)
Calcium: 9.2 mg/dL (ref 8.9–10.3)
Chloride: 94 mmol/L — ABNORMAL LOW (ref 98–111)
Creatinine: 1.63 mg/dL — ABNORMAL HIGH (ref 0.44–1.00)
GFR, Est AFR Am: 40 mL/min — ABNORMAL LOW
GFR, Estimated: 34 mL/min — ABNORMAL LOW
Glucose, Bld: 514 mg/dL — ABNORMAL HIGH (ref 70–99)
Potassium: 5.1 mmol/L (ref 3.5–5.1)
Sodium: 131 mmol/L — ABNORMAL LOW (ref 135–145)
Total Bilirubin: 1.3 mg/dL — ABNORMAL HIGH (ref 0.3–1.2)
Total Protein: 7 g/dL (ref 6.5–8.1)

## 2018-06-11 LAB — PREPARE RBC (CROSSMATCH)

## 2018-06-11 LAB — PROCALCITONIN: Procalcitonin: 31.64 ng/mL

## 2018-06-11 LAB — TSH: TSH: 1.776 u[IU]/mL (ref 0.350–4.500)

## 2018-06-11 LAB — D-DIMER, QUANTITATIVE: D-Dimer, Quant: 3.92 ug/mL-FEU — ABNORMAL HIGH (ref 0.00–0.50)

## 2018-06-11 LAB — MAGNESIUM: Magnesium: 2.1 mg/dL (ref 1.7–2.4)

## 2018-06-11 LAB — CREATININE, SERUM
Creatinine, Ser: 1.04 mg/dL — ABNORMAL HIGH (ref 0.44–1.00)
GFR calc Af Amer: >60 mL/min (ref >60)
GFR calc non Af Amer: 59 mL/min — ABNORMAL LOW (ref >60)

## 2018-06-11 LAB — SARS CORONAVIRUS 2 BY RT PCR (HOSPITAL ORDER, PERFORMED IN ~~LOC~~ HOSPITAL LAB): SARS Coronavirus 2: NEGATIVE

## 2018-06-11 LAB — BRAIN NATRIURETIC PEPTIDE: B Natriuretic Peptide: 809 pg/mL — ABNORMAL HIGH (ref 0.0–100.0)

## 2018-06-11 LAB — MRSA PCR SCREENING: MRSA by PCR: NEGATIVE

## 2018-06-11 MED ORDER — ACETAMINOPHEN 650 MG RE SUPP: 650.0000 mg | Freq: Four times a day (QID) | RECTAL | Status: DC | PRN

## 2018-06-11 MED ORDER — ACETAMINOPHEN 325 MG PO TABS: 650.0000 mg | ORAL_TABLET | Freq: Once | ORAL | Status: DC

## 2018-06-11 MED ORDER — SODIUM CHLORIDE 0.9 % IV SOLN
INTRAVENOUS | Status: DC
  Administered 2018-06-11: 20:00:00 via INTRAVENOUS

## 2018-06-11 MED ORDER — SODIUM CHLORIDE 0.9% FLUSH
10.0000 mL | Freq: Once | INTRAVENOUS | Status: AC
  Administered 2018-06-11: 09:00:00 10 mL
  Filled 2018-06-11: qty 10

## 2018-06-11 MED ORDER — SODIUM CHLORIDE 0.9% FLUSH
10.0000 mL | Freq: Once | INTRAVENOUS | Status: DC | PRN
  Filled 2018-06-11: qty 10

## 2018-06-11 MED ORDER — IOHEXOL 350 MG/ML SOLN
80.0000 mL | Freq: Once | INTRAVENOUS | Status: AC | PRN
  Administered 2018-06-11: 80 mL via INTRAVENOUS

## 2018-06-11 MED ORDER — SODIUM CHLORIDE 0.9% FLUSH
10.0000 mL | INTRAVENOUS | Status: DC | PRN
  Administered 2018-06-21: 10 mL
  Filled 2018-06-11: qty 40

## 2018-06-11 MED ORDER — SENNOSIDES-DOCUSATE SODIUM 8.6-50 MG PO TABS: 1.0000 | ORAL_TABLET | Freq: Every evening | ORAL | Status: DC | PRN

## 2018-06-11 MED ORDER — ONDANSETRON HCL 4 MG PO TABS: 4.0000 mg | ORAL_TABLET | Freq: Four times a day (QID) | ORAL | Status: DC | PRN

## 2018-06-11 MED ORDER — ONDANSETRON HCL 4 MG/2ML IJ SOLN: 4.0000 mg | Freq: Four times a day (QID) | INTRAMUSCULAR | Status: DC | PRN

## 2018-06-11 MED ORDER — ACETAMINOPHEN 325 MG PO TABS
650.0000 mg | ORAL_TABLET | Freq: Four times a day (QID) | ORAL | Status: DC | PRN
  Administered 2018-06-11 – 2018-06-16 (×3): 650 mg via ORAL
  Filled 2018-06-11 (×4): qty 2

## 2018-06-11 MED ORDER — SODIUM CHLORIDE 0.9 % IV SOLN
1.0000 g | INTRAVENOUS | Status: AC
  Administered 2018-06-11 – 2018-06-14 (×4): 1 g via INTRAVENOUS
  Filled 2018-06-11: qty 10
  Filled 2018-06-11 (×4): qty 1

## 2018-06-11 MED ORDER — ACETAMINOPHEN 325 MG PO TABS
650.0000 mg | ORAL_TABLET | Freq: Once | ORAL | Status: AC
  Administered 2018-06-11: 650 mg via ORAL
  Filled 2018-06-11: qty 2

## 2018-06-11 MED ORDER — SODIUM CHLORIDE (PF) 0.9 % IJ SOLN
INTRAMUSCULAR | Status: AC
  Filled 2018-06-11: qty 50

## 2018-06-11 MED ORDER — ADULT MULTIVITAMIN W/MINERALS CH
1.0000 | ORAL_TABLET | Freq: Every day | ORAL | Status: DC
  Administered 2018-06-12 – 2018-06-21 (×10): 1 via ORAL
  Filled 2018-06-11 (×11): qty 1

## 2018-06-11 MED ORDER — SODIUM CHLORIDE 0.9 % IV SOLN: 1.0000 g | Freq: Once | INTRAVENOUS | Status: DC

## 2018-06-11 MED ORDER — SODIUM CHLORIDE 0.9 % IV SOLN
Freq: Once | INTRAVENOUS | Status: AC
  Administered 2018-06-11: 10:00:00 via INTRAVENOUS
  Filled 2018-06-11: qty 250

## 2018-06-11 MED ORDER — SODIUM CHLORIDE 0.9 % IV SOLN: 500.0000 mg | Freq: Once | INTRAVENOUS | Status: DC

## 2018-06-11 MED ORDER — PANTOPRAZOLE SODIUM 40 MG PO PACK
40.0000 mg | PACK | Freq: Every day | ORAL | Status: DC
  Administered 2018-06-12 – 2018-06-18 (×7): 40 mg via ORAL
  Filled 2018-06-11 (×8): qty 20

## 2018-06-11 MED ORDER — LIDOCAINE-PRILOCAINE 2.5-2.5 % EX CREA: 1.0000 "application " | TOPICAL_CREAM | CUTANEOUS | Status: DC | PRN

## 2018-06-11 MED ORDER — DOCUSATE SODIUM 100 MG PO CAPS
100.0000 mg | ORAL_CAPSULE | Freq: Two times a day (BID) | ORAL | Status: DC
  Administered 2018-06-11 – 2018-06-18 (×8): 100 mg via ORAL
  Filled 2018-06-11 (×14): qty 1

## 2018-06-11 MED ORDER — INSULIN ASPART 100 UNIT/ML ~~LOC~~ SOLN
0.0000 [IU] | Freq: Three times a day (TID) | SUBCUTANEOUS | Status: DC
  Administered 2018-06-11: 11 [IU] via SUBCUTANEOUS
  Administered 2018-06-12 – 2018-06-13 (×4): 3 [IU] via SUBCUTANEOUS
  Administered 2018-06-14: 2 [IU] via SUBCUTANEOUS
  Administered 2018-06-14: 5 [IU] via SUBCUTANEOUS
  Administered 2018-06-15: 3 [IU] via SUBCUTANEOUS
  Administered 2018-06-15: 2 [IU] via SUBCUTANEOUS
  Administered 2018-06-16: 3 [IU] via SUBCUTANEOUS
  Administered 2018-06-18 (×2): 2 [IU] via SUBCUTANEOUS
  Administered 2018-06-19: 5 [IU] via SUBCUTANEOUS
  Administered 2018-06-19: 2 [IU] via SUBCUTANEOUS
  Administered 2018-06-20: 3 [IU] via SUBCUTANEOUS
  Administered 2018-06-20: 2 [IU] via SUBCUTANEOUS
  Administered 2018-06-20 – 2018-06-21 (×2): 3 [IU] via SUBCUTANEOUS
  Administered 2018-06-22 (×2): 5 [IU] via SUBCUTANEOUS

## 2018-06-11 MED ORDER — MIRTAZAPINE 15 MG PO TABS
15.0000 mg | ORAL_TABLET | Freq: Every day | ORAL | Status: DC
  Administered 2018-06-11 – 2018-06-21 (×10): 15 mg via ORAL
  Filled 2018-06-11 (×2): qty 1
  Filled 2018-06-11 (×5): qty 2
  Filled 2018-06-11: qty 1
  Filled 2018-06-11: qty 2
  Filled 2018-06-11 (×3): qty 1

## 2018-06-11 MED ORDER — HEPARIN SOD (PORK) LOCK FLUSH 100 UNIT/ML IV SOLN: 250.0000 [IU] | INTRAVENOUS | Status: DC | PRN

## 2018-06-11 MED ORDER — HEPARIN SOD (PORK) LOCK FLUSH 100 UNIT/ML IV SOLN
500.0000 [IU] | Freq: Every day | INTRAVENOUS | Status: AC | PRN
  Administered 2018-06-22: 500 [IU]

## 2018-06-11 MED ORDER — CHLORHEXIDINE GLUCONATE CLOTH 2 % EX PADS
6.0000 | MEDICATED_PAD | Freq: Every day | CUTANEOUS | Status: DC
  Administered 2018-06-12 – 2018-06-18 (×7): 6 via TOPICAL

## 2018-06-11 MED ORDER — SODIUM CHLORIDE 0.9% FLUSH: 10.0000 mL | INTRAVENOUS | Status: DC | PRN

## 2018-06-11 MED ORDER — HEPARIN SOD (PORK) LOCK FLUSH 100 UNIT/ML IV SOLN
500.0000 [IU] | Freq: Once | INTRAVENOUS | Status: DC | PRN
  Filled 2018-06-11: qty 5

## 2018-06-11 MED ORDER — SODIUM CHLORIDE 0.9% FLUSH: 3.0000 mL | INTRAVENOUS | Status: DC | PRN

## 2018-06-11 MED ORDER — MONTELUKAST SODIUM 10 MG PO TABS
10.0000 mg | ORAL_TABLET | Freq: Every day | ORAL | Status: DC
  Administered 2018-06-12 – 2018-06-22 (×11): 10 mg via ORAL
  Filled 2018-06-11 (×11): qty 1

## 2018-06-11 MED ORDER — SODIUM CHLORIDE 0.9% FLUSH
10.0000 mL | Freq: Two times a day (BID) | INTRAVENOUS | Status: DC
  Administered 2018-06-11: 20 mL
  Administered 2018-06-12 – 2018-06-22 (×17): 10 mL

## 2018-06-11 MED ORDER — INSULIN DETEMIR 100 UNIT/ML ~~LOC~~ SOLN
10.0000 [IU] | Freq: Every day | SUBCUTANEOUS | Status: DC
  Administered 2018-06-11 – 2018-06-17 (×7): 10 [IU] via SUBCUTANEOUS
  Filled 2018-06-11 (×8): qty 0.1

## 2018-06-11 MED ORDER — HYDROCODONE-ACETAMINOPHEN 5-325 MG PO TABS
1.0000 | ORAL_TABLET | ORAL | Status: DC | PRN
  Administered 2018-06-13: 1 via ORAL
  Administered 2018-06-13: 2 via ORAL
  Administered 2018-06-13: 1 via ORAL
  Administered 2018-06-14 – 2018-06-21 (×12): 2 via ORAL
  Administered 2018-06-22: 14:00:00 1 via ORAL
  Filled 2018-06-11 (×4): qty 2
  Filled 2018-06-11: qty 1
  Filled 2018-06-11 (×3): qty 2
  Filled 2018-06-11: qty 1
  Filled 2018-06-11 (×4): qty 2
  Filled 2018-06-11: qty 1
  Filled 2018-06-11 (×3): qty 2

## 2018-06-11 MED ORDER — INSULIN ASPART 100 UNIT/ML ~~LOC~~ SOLN
10.0000 [IU] | Freq: Once | SUBCUTANEOUS | Status: AC
  Administered 2018-06-11: 10 [IU] via SUBCUTANEOUS
  Filled 2018-06-11: qty 1

## 2018-06-11 MED ORDER — PROCHLORPERAZINE MALEATE 10 MG PO TABS: 10.0000 mg | ORAL_TABLET | Freq: Four times a day (QID) | ORAL | Status: DC | PRN

## 2018-06-11 MED ORDER — DIPHENHYDRAMINE HCL 25 MG PO CAPS: 25.0000 mg | ORAL_CAPSULE | Freq: Once | ORAL | Status: DC

## 2018-06-11 MED ORDER — SODIUM CHLORIDE 0.9% FLUSH
10.0000 mL | INTRAVENOUS | Status: AC | PRN
  Administered 2018-06-22: 10 mL

## 2018-06-11 MED ORDER — ALBUTEROL SULFATE HFA 108 (90 BASE) MCG/ACT IN AERS: 2.0000 | INHALATION_SPRAY | Freq: Four times a day (QID) | RESPIRATORY_TRACT | Status: DC | PRN

## 2018-06-11 MED ORDER — ORAL CARE MOUTH RINSE
15.0000 mL | Freq: Two times a day (BID) | OROMUCOSAL | Status: DC
  Administered 2018-06-12 – 2018-06-20 (×12): 15 mL via OROMUCOSAL

## 2018-06-11 MED ORDER — SODIUM CHLORIDE 0.9 % IV BOLUS
1000.0000 mL | Freq: Once | INTRAVENOUS | Status: AC
  Administered 2018-06-11: 1000 mL via INTRAVENOUS

## 2018-06-11 MED ORDER — INSULIN ASPART 100 UNIT/ML ~~LOC~~ SOLN
8.0000 [IU] | Freq: Three times a day (TID) | SUBCUTANEOUS | Status: DC
  Administered 2018-06-11 – 2018-06-14 (×5): 8 [IU] via SUBCUTANEOUS

## 2018-06-11 MED ORDER — LEVALBUTEROL HCL 0.63 MG/3ML IN NEBU: 0.6300 mg | INHALATION_SOLUTION | Freq: Four times a day (QID) | RESPIRATORY_TRACT | Status: DC | PRN

## 2018-06-11 MED ORDER — PROCHLORPERAZINE MALEATE 10 MG PO TABS
10.0000 mg | ORAL_TABLET | Freq: Four times a day (QID) | ORAL | Status: DC | PRN
  Administered 2018-06-18: 10 mg via ORAL
  Filled 2018-06-11: qty 1

## 2018-06-11 MED ORDER — HEPARIN SOD (PORK) LOCK FLUSH 100 UNIT/ML IV SOLN: 500.0000 [IU] | Freq: Every day | INTRAVENOUS | Status: DC | PRN

## 2018-06-11 MED ORDER — MAGNESIUM CITRATE PO SOLN: 1.0000 | Freq: Once | ORAL | Status: DC | PRN

## 2018-06-11 MED ORDER — SODIUM CHLORIDE 0.9% IV SOLUTION: 250.0000 mL | Freq: Once | INTRAVENOUS | Status: DC

## 2018-06-11 MED ORDER — SODIUM CHLORIDE 0.9% FLUSH
3.0000 mL | INTRAVENOUS | Status: AC | PRN
  Administered 2018-06-17: 3 mL

## 2018-06-11 MED ORDER — SODIUM CHLORIDE 0.9% IV SOLUTION
Freq: Once | INTRAVENOUS | Status: AC
  Administered 2018-06-11: 10 mL/h via INTRAVENOUS

## 2018-06-11 MED ORDER — ADENOSINE 6 MG/2ML IV SOLN
INTRAVENOUS | Status: AC
  Administered 2018-06-11: 6 mg
  Filled 2018-06-11: qty 6

## 2018-06-11 MED ORDER — SORBITOL 70 % SOLN: 30.0000 mL | Freq: Every day | Status: DC | PRN

## 2018-06-11 MED ORDER — HEPARIN SODIUM (PORCINE) 5000 UNIT/ML IJ SOLN
5000.0000 [IU] | Freq: Three times a day (TID) | INTRAMUSCULAR | Status: DC
  Administered 2018-06-11 – 2018-06-12 (×2): 5000 [IU] via SUBCUTANEOUS
  Filled 2018-06-11 (×2): qty 1

## 2018-06-11 MED ORDER — DIPHENHYDRAMINE HCL 25 MG PO CAPS
25.0000 mg | ORAL_CAPSULE | Freq: Once | ORAL | Status: AC
  Administered 2018-06-11: 25 mg via ORAL
  Filled 2018-06-11: qty 1

## 2018-06-11 MED ORDER — ONDANSETRON HCL 4 MG PO TABS: 8.0000 mg | ORAL_TABLET | Freq: Three times a day (TID) | ORAL | Status: DC | PRN

## 2018-06-11 MED ORDER — TRAZODONE HCL 50 MG PO TABS
50.0000 mg | ORAL_TABLET | Freq: Every evening | ORAL | Status: DC | PRN
  Administered 2018-06-15 – 2018-06-20 (×2): 50 mg via ORAL
  Filled 2018-06-11 (×2): qty 1

## 2018-06-11 MED ORDER — SODIUM CHLORIDE 0.9 % IV SOLN
500.0000 mg | INTRAVENOUS | Status: AC
  Administered 2018-06-11 – 2018-06-14 (×4): 500 mg via INTRAVENOUS
  Filled 2018-06-11 (×4): qty 500

## 2018-06-11 NOTE — Progress Notes (Signed)
[facility] OFFICE PROGRESS NOTE  Patient Care Team: Robyne Peers, MD as PCP - General (Family Medicine)  ASSESSMENT & PLAN:  Metastatic breast cancer [facility]) Currently, she is unstable I discussed with the patient and her sister and she agreed to be admitted After discussion with the hospitalist, she is directed to the emergency department for aggressive care and resuscitation  Pancytopenia, acquired [facility]) She has severe pancytopenia and symptomatic from her severe anemia I recommend 2 units of blood transfusion This is ordered I discussed with the blood bank and they would direct the blood to emergency room so that she can be transfused as soon as possible Her platelet count is low but stable at her baseline She does not need platelet transfusion unless she is bleeding or platelet count less than 10,000  Uncontrolled diabetes mellitus with hyperglycemia ([place]) She has severe, uncontrolled diabetes and concern right now with her recent nausea, vomiting and acute renal failure, whether she might have diabetic ketoacidosis I discussed this with the hospitalist She needs to be started with insulin drip immediately and she is directed to the emergency department IV fluid hydration is started in the cancer center  Acute renal failure (ARF) ([place]) She has severe acute renal failure, likely due to recent severe dehydration and possibly DKA She is started on IV fluid resuscitation  Goals of care, counseling/discussion The patient has significant concern about being admitted I discussed with the patient the reason for admission due to cardiovascular instability and severe blood count abnormalities I have discussed with her sister who is her principal caregiver and next of kin and she agreed with the plan of care After all test results are available, the patient agreed to be admitted However, based on discussion with hospitalist, she needs to be stabilized in the emergency  department first before admission to the floor   Orders Placed This Encounter  Procedures  . SCHEDULING COMMUNICATION    Please schedule 3 hour infusion visit for fluids and hydration  . Prepare RBC    Standing Status:   Standing    Number of Occurrences:   1    Order Specific Question:   # of Units    Answer:   2 units    Order Specific Question:   Transfusion Indications    Answer:   Symptomatic Anemia    Order Specific Question:   Special Requirements    Answer:   Irradiated    Order Specific Question:   If emergent release call blood bank    Answer:   [facility] [phone]  . Type and screen         Standing Status:   Future    Number of Occurrences:   1    Standing Expiration Date:   06/11/2019    INTERVAL HISTORY: Please see below for problem oriented charting. She is seen for her weekly supportive care visit today Over the weekend, she has not been feeling well and have nausea vomiting The patient has declined admission or evaluation over the last 2 days Today, she presented to the cancer center with cardiovascular instability, feeling weak and unwell She took Zofran this morning and denies further nausea Her intake has been poor The patient denies any recent signs or symptoms of bleeding such as spontaneous epistaxis, hematuria or hematochezia. She stated her blood sugar has not been high at home  SUMMARY OF ONCOLOGIC HISTORY: Oncology History   Biopsy is ER negative Her2/neu positive  Metastatic breast cancer ([place])   03/11/2018 Imaging    Ct scan of chest, abdomen and pelvis 1. 2.6 x 2.3 cm spiculated mass identified inferomedial quadrant of the left breast. Lesion appears to retract the overlying skin. Mammographic correlation recommended. 2. Small lymph nodes in the left axillar ill-defined and suspicious for metastatic disease. 3. Multiple bilateral pulmonary nodules measuring up to 2.7 cm diameter. These probably represent metastatic disease. Given  the dominant size of the central right upper lobe lesion, synchronous lung primary can not be completely excluded. 4. Multiple ill-defined liver lesions compatible with metastatic disease. Dominant liver metastases measure up to almost 4 cm. 5.  Aortic Atherosclerois (ICD10-170.0)     03/12/2018 Pathology Results    Liver, needle/core biopsy, left lobe - METASTATIC CARCINOMA. - LYMPHOVASCULAR INVASION IS IDENTIFIED. - SEE COMMENT. Microscopic Comment The tumor cells are positive for cytokeratin 7. There is faint staining for GATA-3. There is non-specific staining for TTF-1. Cytokeratin 20, CDX-2, estrogen receptor, and GCDFP stains are negative. Given the clinical suspicion, the profile supports a primary breast carcinoma. Her2 will be performed and the results reported separately.  By immunohistochemistry, the tumor cells are POSITIVE for Her2 (3+).    03/12/2018 Imaging    Single 1.3 x 1.4 cm RIGHT occipital metastasis.  Borderline pachymeningeal enhancement of symmetric nature could be related to the superficial metastasis or osseous disease.    03/12/2018 Procedure    Ultrasound-guided core biopsy performed of a mass within the lateral segment of the left lobe of the liver.    03/16/2018 Imaging    Right occipital lobe 13 x 16 mm. Small satellite enhancing nodule deep to the larger lesion. The larger lesion shows central necrosis and probable mild hemorrhage or calcification.  Mild dural enhancement is less impressive and could be within normal limits.  Bone marrow in the clivus and cervical spine is diffusely low signal. No focal lesion. This may be related to the patient's anemia and abnormal blood count.    03/17/2018 Cancer Staging    Staging form: Breast, AJCC 8th Edition - Clinical: Stage IV (cT2, cN0, pM1, GX, ER-, PR: Not Assessed, HER2+) - Signed by [name], MD on 03/17/2018    03/19/2018 -  Chemotherapy    She received chemo with Taxotere, Herceptin and Perjeta     03/21/2018 Echocardiogram    IMPRESSIONS 1. The left ventricle has normal systolic function with an ejection fraction of 60-65%. The cavity size was normal. Left ventricular diastolic Doppler parameters are consistent with impaired relaxation.  2. The right ventricle has normal systolic function. The cavity was normal. There is no increase in right ventricular wall thickness.  3. The mitral valve is normal in structure.  4. The tricuspid valve is normal in structure.  5. Strain imaging performed but not reported due to interpreter judgement, secondary to suboptimal image quality.    03/24/2018 Procedure    Successful placement of a right IJ approach Power Port with ultrasound and fluoroscopic guidance. The catheter is ready for use.    04/07/2018 - 04/16/2018 Hospital Admission    She was admitted for pneumonia    04/07/2018 - 04/16/2018 Hospital Admission    She was admitted to the hospital for pneumonia and respiratory failure    04/25/2018 Imaging    Retropharyngeal fluid collection compatible with effusion or possibly abscess. Soft tissue thickening extends into the right neck and surrounds the right carotid bifurcation and right internal carotid artery. There is also streaky density in the right  lateral neck soft tissues with a focal 7.6 Mm lymph node in the right lateral neck. Stranding surrounds the right submandibular gland.   Findings are most compatible with infection. Favor pharyngitis with retropharyngeal effusion/abscess. Soft tissue thickening around the right carotid most compatible with infection rather than tumor. Right jugular vein is patent.  Multiple nodular densities in the right upper lobe may represent residual pneumonia or metastatic disease. Interval improvement in right upper lobe infiltrate compared with CT of 04/08/2018    04/25/2018 - 05/06/2018 Hospital Admission    She was admitted for retropharyngeal abscess    04/27/2018 Imaging    No evidence of large central  pulmonary embolus is noted in the main pulmonary artery or the main portions of the right and left pulmonary arteries. However, evaluation of the lower lobe branches, particularly on the right, is limited due to respiratory motion artifact and other limiting issues. Pulmonary emboli and smaller peripheral branches can not be excluded on the basis of this exam.  Large left pleural effusion is noted with complete atelectasis of the left lower lobe. Increased left upper lobe opacity is noted posteriorly concerning for atelectasis or pneumonia.  Improved right upper lobe and lower lobe opacities are noted suggesting improving pneumonia or atelectasis.  15 mm right paratracheal lymph node is noted which is enlarged compared to prior exam; it is uncertain if this is metastatic or inflammatory in etiology.  Hepatic metastatic lesions are again noted.  Aortic Atherosclerosis (ICD10-I70.0).     04/27/2018 Procedure    Successful ultrasound guided left thoracentesis yielding 120 mL of blood-tinged of pleural fluid.     Metastasis to brain ([place])   03/17/2018 Initial Diagnosis    Metastasis to brain [facility])     Metastasis to liver ([place])   03/17/2018 Initial Diagnosis    Metastasis to liver [facility])     Metastasis to bone ([place])   03/17/2018 Initial Diagnosis    Metastasis to bone ([facility])     REVIEW OF SYSTEMS:   Constitutional: Denies fevers, chills or abnormal weight loss Eyes: Denies blurriness of vision Ears, nose, mouth, throat, and face: Denies mucositis or sore throat Respiratory: Denies cough, dyspnea or wheezes Cardiovascular: Denies palpitation, chest discomfort or lower extremity swelling Skin: Denies abnormal skin rashes Lymphatics: Denies new lymphadenopathy or easy bruising Behavioral/Psych: Mood is stable, no new changes  All other systems were reviewed with the patient and are negative.  I have reviewed the past medical history, past surgical history, social history and  family history with the patient and they are unchanged from previous note.  ALLERGIES:  is allergic to nsaids.  MEDICATIONS:  Current Facility-Administered Medications  Medication Dose Route Frequency Provider Last Rate Last Dose  . heparin lock flush 100 unit/mL  500 Units Intracatheter Once PRN Alvy Bimler, Marvon [place], MD      . sodium chloride flush (NS) 0.9 % injection 10 mL  10 mL Intracatheter Once PRN [name], MD       Current Outpatient Medications  Medication Sig Dispense Refill  . ACCU-CHEK AVIVA PLUS test strip     . ACCU-CHEK SOFTCLIX LANCETS lancets     . albuterol (PROAIR HFA) 108 (90 Base) MCG/ACT inhaler Inhale 2 puffs into the lungs every 6 (six) hours as needed for wheezing or shortness of breath. 1 Inhaler 1  . blood glucose meter kit and supplies KIT Dispense based on patient and insurance preference. Use up to four times daily as directed. (FOR ICD-9 250.00, 250.01). 1 each 0  .  esomeprazole (NEXIUM) 40 MG capsule Take 1 capsule (40 mg total) by mouth 2 (two) times daily before a meal. 180 capsule 1  . fluticasone (FLOVENT HFA) 44 MCG/ACT inhaler Inhale 2 puffs into the lungs 2 (two) times daily. 3 Inhaler 1  . insulin aspart (NOVOLOG) 100 UNIT/ML FlexPen Inject 8 Units into the skin 3 (three) times daily with meals. 15 mL 11  . Insulin Detemir (LEVEMIR) 100 UNIT/ML Pen Inject 10 Units into the skin daily. (Patient taking differently: Inject 10 Units into the skin at bedtime. ) 15 mL 0  . Insulin Pen Needle 31G X 5 MM MISC 1 Device by Does not apply route QID. For use with insulin pens 100 each 0  . lidocaine-prilocaine (EMLA) cream Apply to affected area once (Patient taking differently: Apply 1 application topically as needed (port access). Apply to affected area once) 30 g 3  . LORazepam (ATIVAN) 0.5 MG tablet Take 1 tab po 30 minutes prior to radiation or MRI (Patient taking differently: Take 0.5 mg by mouth See admin instructions. Take 30 minutes prior to radiation or MRI) 30  tablet 0  . metFORMIN (GLUCOPHAGE) 500 MG tablet Take 500 mg by mouth daily.    . metoprolol succinate (TOPROL-XL) 50 MG 24 hr tablet Take 50 mg by mouth every evening.    . montelukast (SINGULAIR) 10 MG tablet Take 1 tablet (10 mg total) by mouth daily. 30 tablet 1  . morphine (MSIR) 15 MG tablet Take 1 tablet (15 mg total) by mouth every 4 (four) hours as needed for severe pain. 60 tablet 0  . Multiple Vitamin (MULTIVITAMIN WITH MINERALS) TABS tablet Take 1 tablet by mouth daily.    . ondansetron (ZOFRAN) 8 MG tablet Take 8 mg by mouth every 8 (eight) hours as needed.    . prochlorperazine (COMPAZINE) 10 MG tablet Take 10 mg by mouth every 6 (six) hours as needed.     Facility-Administered Medications Ordered in Other Visits  Medication Dose Route Frequency Provider Last Rate Last Dose  . heparin lock flush 100 unit/mL  250 Units Intracatheter PRN Norris Bodley, MD      . sodium chloride flush (NS) 0.9 % injection 3 mL  3 mL Intracatheter PRN Alvy Bimler, Nature Kueker, MD        PHYSICAL EXAMINATION: ECOG PERFORMANCE STATUS: 2 - Symptomatic, <50% confined to bed  Vitals:   06/11/18 1004 06/11/18 1020  BP: (!) 86/53 (!) 78/45  Pulse: (!) 163   Resp: (!) 44 (!) 44  Temp:    SpO2: 97% 97%   Filed Weights    GENERAL:alert, no distress and comfortable.  She is ill-appearing SKIN: She looks pale EYES: normal, Conjunctiva are pale OROPHARYNX:no exudate, no erythema and lips, buccal mucosa, and tongue normal  NECK: supple, thyroid normal size, non-tender, without nodularity LYMPH:  no palpable lymphadenopathy in the cervical, axillary or inguinal LUNGS: clear to auscultation and percussion with normal breathing effort HEART: Tachycardia, no murmurs and no lower extremity edema ABDOMEN:abdomen soft, non-tender and normal bowel sounds Musculoskeletal:no cyanosis of digits and no clubbing  NEURO: alert & oriented x 3 with fluent speech, no focal motor/sensory deficits  LABORATORY DATA:  I have  reviewed the data as listed    Component Value Date/Time   NA 131 (L) 06/11/2018 0905   K 5.1 06/11/2018 0905   CL 94 (L) 06/11/2018 0905   CO2 23 06/11/2018 0905   GLUCOSE 514 (H) 06/11/2018 0905   BUN 44 (H) 06/11/2018 5409  CREATININE 1.63 (H) 06/11/2018 0905   CALCIUM 9.2 06/11/2018 0905   PROT 7.0 06/11/2018 0905   ALBUMIN 2.1 (L) 06/11/2018 0905   AST 11 (L) 06/11/2018 0905   ALT 13 06/11/2018 0905   ALKPHOS 137 (H) 06/11/2018 0905   BILITOT 1.3 (H) 06/11/2018 0905   GFRNONAA 34 (L) 06/11/2018 0905   GFRAA 40 (L) 06/11/2018 0905    No results found for: SPEP, UPEP  Lab Results  Component Value Date   WBC 15.3 (H) 06/11/2018   NEUTROABS 10.2 (H) 06/11/2018   HGB 6.2 (LL) 06/11/2018   HCT 20.0 (L) 06/11/2018   MCV 86.2 06/11/2018   PLT 63 (L) 06/11/2018      Chemistry      Component Value Date/Time   NA 131 (L) 06/11/2018 0905   K 5.1 06/11/2018 0905   CL 94 (L) 06/11/2018 0905   CO2 23 06/11/2018 0905   BUN 44 (H) 06/11/2018 0905   CREATININE 1.63 (H) 06/11/2018 0905      Component Value Date/Time   CALCIUM 9.2 06/11/2018 0905   ALKPHOS 137 (H) 06/11/2018 0905   AST 11 (L) 06/11/2018 0905   ALT 13 06/11/2018 0905   BILITOT 1.3 (H) 06/11/2018 0905       RADIOGRAPHIC STUDIES: I have personally reviewed the radiological images as listed and agreed with the findings in the report. Ct Thoracic Spine Wo Contrast  Result Date: 05/19/2018 CLINICAL DATA:  Fall last week. Back pain and bilateral legs since fall. EXAM: CT THORACIC SPINE WITHOUT CONTRAST TECHNIQUE: Multidetector CT images of the thoracic were obtained using the standard protocol without intravenous contrast. COMPARISON:  CT chest 04/27/2018 FINDINGS: Alignment: Normal Vertebrae: Negative for fracture or metastatic disease. No lytic or sclerotic bone lesion identified. Paraspinal and other soft tissues: Extensive left lower lobe consolidation as noted previously. Progression of right lower lobe  consolidation. Right upper lobe mass lesion approximately 2.4 cm is unchanged and may represent neoplasm. Calcified subcarinal lymph node unchanged. Disc levels: Disc spaces maintained without significant degenerative change or spinal stenosis. IMPRESSION: 1. Negative for fracture or metastatic disease thoracic spine 2. Bibasilar consolidation with progression on the right. Possible pneumonia or tumor. 2.4 cm right upper lobe spiculated mass may represent metastatic disease or bronchogenic carcinoma. Electronically Signed   By: Franchot Gallo M.D.   On: 05/19/2018 17:16   Ct Lumbar Spine Wo Contrast  Result Date: 05/19/2018 CLINICAL DATA:  Fall 1 week ago. Back pain. History of metastatic breast cancer. EXAM: CT LUMBAR SPINE WITHOUT CONTRAST TECHNIQUE: Multidetector CT imaging of the lumbar spine was performed without intravenous contrast administration. Multiplanar CT image reconstructions were also generated. COMPARISON:  CT abdomen pelvis 04/08/2018 FINDINGS: Segmentation: Normal Alignment: Normal Vertebrae: Negative for lumbar fracture. 3 mm sclerotic lesion L3 vertebral body. This has enlarged slightly from 03/11/2018 therefore is suspicious for metastatic disease. No other sclerotic or lytic lesions. Paraspinal and other soft tissues: Negative for paraspinous mass or adenopathy. Mild atherosclerotic aorta. Disc levels: Mild lumbar disc and facet degeneration. Negative for disc protrusion or stenosis. IMPRESSION: 1. Negative for lumbar fracture 2. 3 mm sclerotic lesion L4 vertebral body likely due to metastatic disease. This has enlarged since 03/21/2018. Electronically Signed   By: Franchot Gallo M.D.   On: 05/19/2018 17:23   Ct Abdomen Pelvis W Contrast  Result Date: 05/19/2018 CLINICAL DATA:  Blunt abdominal trauma. EXAM: CT ABDOMEN AND PELVIS WITH CONTRAST TECHNIQUE: Multidetector CT imaging of the abdomen and pelvis was performed using the standard  protocol following bolus administration of  intravenous contrast. CONTRAST:  14m OMNIPAQUE IOHEXOL 300 MG/ML  SOLN COMPARISON:  CT scan of April 08, 2018. FINDINGS: Lower chest: Bilateral posterior basilar atelectasis or pneumonia is noted. Hepatobiliary: No gallstones or biliary dilatation is noted. Intrahepatic metastatic lesions noted on prior exam are significantly smaller. 1.9 cm lesion is seen in peripheral portion of right hepatic lobe which is decreased compared to prior exam where it measured 2.9 cm. 1.5 cm left hepatic lesion is noted which is significantly smaller compared to prior exam, where it measures 3.7 cm. Pancreas: Unremarkable. No pancreatic ductal dilatation or surrounding inflammatory changes. Spleen: Calcified splenic granulomata are noted. No other significant abnormality is noted. Adrenals/Urinary Tract: Adrenal glands are unremarkable. Kidneys are normal, without renal calculi, focal lesion, or hydronephrosis. Bladder is unremarkable. Stomach/Bowel: The stomach appears normal. There is no evidence of bowel obstruction or inflammation. The appendix is not clearly visualized. Vascular/Lymphatic: Aortic atherosclerosis. No enlarged abdominal or pelvic lymph nodes. Reproductive: Uterus and bilateral adnexa are unremarkable. Other: There is noted interval enlargement of the right iliacus muscle which is concerning for underlying hematoma. No hernia is noted. Musculoskeletal: No acute or significant osseous findings. IMPRESSION: Interval development of enlargement of right iliacus muscle with surrounding soft tissue stranding concerning for underlying hematoma or possibly severe muscular injury. Hepatic metastatic lesions noted on prior exam is significantly smaller currently. Bilateral posterior basilar atelectasis or pneumonia is noted. Aortic Atherosclerosis (ICD10-I70.0). Electronically Signed   By: JMarijo ConceptionM.D.   On: 05/19/2018 17:17   Dg Pelvis Portable  Result Date: 05/19/2018 CLINICAL DATA:  58year old female with a  fall a few years ago with bilateral hip pain EXAM: PORTABLE PELVIS 1-2 VIEWS COMPARISON:  None. FINDINGS: Bony pelvic ring intact, with no acute displaced fracture. Bilateral hips projects normally over the acetabula. Mild degenerative changes bilateral hips. Unremarkable proximal femurs. IMPRESSION: Negative for acute bony abnormality. Early bilateral hip osteoarthritis Electronically Signed   By: JCorrie MckusickD.O.   On: 05/19/2018 12:34   Dg Chest Port 1 View  Result Date: 05/19/2018 CLINICAL DATA:  Fall last Tuesday. Pain in both hips and legs. Fever. EXAM: PORTABLE CHEST 1 VIEW COMPARISON:  May 05, 2018 FINDINGS: The right Port-A-Cath terminates in the central SVC. There is mild opacity in left base, significantly improved since May 05, 2018. Most of the right-sided infiltrate is also resolved. Minimal opacity remains in the medial right upper lobe. No other interval changes. IMPRESSION: Mild opacities remain in the medial right upper lobe in the left base, significantly improved in the interval. The remainder of infiltrates previously seen have resolved. Recommend follow-up to complete resolution. Electronically Signed   By: DDorise BullionIII M.D   On: 05/19/2018 12:33    All questions were answered. The patient knows to call the clinic with any problems, questions or concerns. No barriers to learning was detected.  I spent 55 minutes counseling the patient face to face. The total time spent in the appointment was 60 minutes and more than 50% was on counseling and review of test results  NHeath Lark MD 06/11/2018 10:52 AM

## 2018-06-11 NOTE — Progress Notes (Signed)
At 1040 via w/c to ER room 24, report to RN. Normal Saline at 500 ml/ hr.

## 2018-06-11 NOTE — ED Provider Notes (Signed)
[facility] DEPT Provider Note   CSN: [id number] Arrival date & time: 06/11/18  1043    History   Chief Complaint Chief Complaint  Patient presents with  . Hypotension  . Hyperglycemia    HPI Stacy Shelton is a 58 y.o. female.     Pt presents to the ED today from the cancer center.  She has a hx of metastatic breast cancer to liver, lung, and to brain.  She last had chemo on 4/29.  She went to the cancer center today for routine blood tests.  There, she had a hemoglobin of 6.2, plt of 63, Glc of 514, CO2 23, Cr of 1.63.  Her bp was also low.  They were going to give a blood transfusion there, but due to her multiple lab abnormalities and vital sign abnormalities, they sent her here.  Dr. Alvy Bimler spoke to the hospitalist for a direct admission.  However, they wanted her evaluated here first.  The pt has no specific complaints.  She said "she's falling apart."  She feels weak and tired.  She did not take her diabetes meds this morning as she did not eat.  Dr. Alvy Bimler said she does not need a plt transfusion unless her plt ct drops below 10.  She did talk to the blood bank to try to get pt transfused as soon as possible here instead of at the cancer center.  Pt denies any known covid contacts.  She has not had fever or cough.  She tested negative for covid about 3 weeks ago.      Past Medical History:  Diagnosis Date  . Asthma   . Diabetes ([place])   . Metastatic breast cancer ([place])   . Seasonal allergies     Patient Active Problem List   Diagnosis Date Noted  . Acute renal failure (ARF) ([facility]) 06/11/2018  . Other constipation 05/20/2018  . Metastasis to lung ([place]) 05/20/2018  . Dysuria 05/15/2018  . Cancer associated pain 05/15/2018  . Physical debility 05/13/2018  . S/P thoracentesis   . Increased oxygen demand   . Tachypnea   . Hypoxia   . Chronic diastolic CHF (congestive heart failure) ([place]) 04/24/2018  . Anemia associated with chemotherapy  04/24/2018  . Chemotherapy-induced thrombocytopenia 04/24/2018  . Acute cystitis without hematuria   . Pneumonia due to infectious organism   . Malnutrition of moderate degree 04/09/2018  . Septic shock ([place])   . Sepsis ([place]) 04/07/2018  . Hypokalemia 04/07/2018  . Hypomagnesemia 04/07/2018  . Acute lower UTI 04/07/2018  . Hypophosphatemia 04/07/2018  . Diarrhea 03/21/2018  . Metastatic breast cancer ([place]) 03/17/2018  . 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 Metastasis to liver ([place]) 03/17/2018  . Metastasis to bone ([place]) 03/17/2018  . Pancytopenia, acquired ([place]) 03/17/2018  . Goals of care, counseling/discussion 03/17/2018  . Encounter for antineoplastic chemotherapy 03/17/2018  . Absolute anemia 03/11/2018  . Thrombocytopenia ([place]) 03/11/2018  . Uncontrolled diabetes mellitus with hyperglycemia ([place]) 03/11/2018  . Moderate persistent asthma 03/11/2018    Past Surgical History:  Procedure Laterality Date  . IR IMAGING GUIDED PORT INSERTION  03/24/2018  . KIDNEY STONE SURGERY    . SINOSCOPY    . WISDOM TOOTH EXTRACTION       OB History   No obstetric history on file.      Home Medications    Prior to Admission medications   Medication Sig Start Date End Date Taking? Authorizing Provider  albuterol (PROAIR HFA) 108 (90  Base) MCG/ACT inhaler Inhale 2 puffs into the lungs every 6 (six) hours as needed for wheezing or shortness of breath. 05/16/17  Yes Padgett, Rae Halsted, MD  esomeprazole (NEXIUM) 40 MG capsule Take 1 capsule (40 mg total) by mouth 2 (two) times daily before a meal. 05/16/17  Yes Padgett, Rae Halsted, MD  fluticasone (FLOVENT HFA) 44 MCG/ACT inhaler Inhale 2 puffs into the lungs 2 (two) times daily. 05/16/17  Yes Padgett, Rae Halsted, MD  insulin aspart (NOVOLOG) 100 UNIT/ML FlexPen Inject 8 Units into the skin 3 (three) times daily with meals. Patient taking differently: Inject 8 Units into the skin 3 (three) times daily with meals. Holds  dose if not going to eat. 06/04/18  Yes Gorsuch, Ni, MD  Insulin Detemir (LEVEMIR) 100 UNIT/ML Pen Inject 10 Units into the skin daily. Patient taking differently: Inject 10 Units into the skin at bedtime.  05/06/18  Yes Dahal, Marlowe Aschoff, MD  lidocaine-prilocaine (EMLA) cream Apply to affected area once Patient taking differently: Apply 1 application topically as needed (port access). Apply to affected area once 03/21/18  Yes Gorsuch, Ni, MD  LORazepam (ATIVAN) 0.5 MG tablet Take 1 tab po 30 minutes prior to radiation or MRI Patient taking differently: Take 0.5 mg by mouth See admin instructions. Take 30 minutes prior to radiation or MRI 03/14/18  Yes Hayden Pedro, PA-C  metFORMIN (GLUCOPHAGE) 500 MG tablet Take 500 mg by mouth daily with breakfast.    Yes [provider]  metoprolol succinate (TOPROL-XL) 50 MG 24 hr tablet Take 50 mg by mouth every evening. 03/10/18  Yes [provider]  montelukast (SINGULAIR) 10 MG tablet Take 1 tablet (10 mg total) by mouth daily. 06/10/18  Yes Padgett, Rae Halsted, MD  morphine (MSIR) 15 MG tablet Take 1 tablet (15 mg total) by mouth every 4 (four) hours as needed for severe pain. 05/20/18  Yes [name], MD  Multiple Vitamin (MULTIVITAMIN WITH MINERALS) TABS tablet Take 1 tablet by mouth daily. 05/07/18  Yes Dahal, Marlowe Aschoff, MD  ondansetron (ZOFRAN) 8 MG tablet Take 8 mg by mouth every 8 (eight) hours as needed for nausea or vomiting.    Yes [provider]  prochlorperazine (COMPAZINE) 10 MG tablet Take 10 mg by mouth every 6 (six) hours as needed for nausea or vomiting.    Yes [provider]  ACCU-CHEK AVIVA PLUS test strip  03/13/18   [provider]  ACCU-CHEK SOFTCLIX LANCETS lancets  03/13/18   [provider]  blood glucose meter kit and supplies KIT Dispense based on patient and insurance preference. Use up to four times daily as directed. (FOR ICD-9 250.00, 250.01). 03/13/18   Donne Hazel, MD   Insulin Pen Needle 31G X 5 MM MISC 1 Device by Does not apply route QID. For use with insulin pens 03/13/18   Donne Hazel, MD    Family History Family History  Problem Relation Age of Onset  . Allergic rhinitis Neg Hx   . Angioedema Neg Hx   . Asthma Neg Hx   . Atopy Neg Hx   . Eczema Neg Hx   . Immunodeficiency Neg Hx   . Urticaria Neg Hx     Social History Social History   Tobacco Use  . Smoking status: Never Smoker  . Smokeless tobacco: Never Used  Substance Use Topics  . Alcohol use: Yes    Alcohol/week: 0.0 standard drinks    Comment: socially  . Drug use: No  Allergies   Nsaids   Review of Systems Review of Systems  Neurological: Positive for weakness.  All other systems reviewed and are negative.    Physical Exam Updated Vital Signs BP (!) 88/51   Pulse 75   Temp 98.1 F (36.7 C) (Oral)   Resp (!) 43   Ht 5' 2" (1.575 m)   Wt 59.9 kg   LMP 10/30/2010   SpO2 97%   BMI 24.14 kg/m   Physical Exam Vitals signs and nursing note reviewed.  Constitutional:      Appearance: Normal appearance.  HENT:     Head: Normocephalic and atraumatic.     Right Ear: External ear normal.     Left Ear: External ear normal.     Nose: Nose normal.     Mouth/Throat:     Mouth: Mucous membranes are dry.  Eyes:     Extraocular Movements: Extraocular movements intact.     Conjunctiva/sclera: Conjunctivae normal.     Pupils: Pupils are equal, round, and reactive to light.  Neck:     Musculoskeletal: Normal range of motion and neck supple.  Cardiovascular:     Rate and Rhythm: Regular rhythm. Tachycardia present.     Pulses: Normal pulses.     Heart sounds: Normal heart sounds.  Pulmonary:     Effort: Pulmonary effort is normal.     Breath sounds: Normal breath sounds.  Abdominal:     General: Abdomen is flat. Bowel sounds are normal.     Palpations: Abdomen is soft.  Musculoskeletal: Normal range of motion.  Skin:    General: Skin is warm and dry.      Capillary Refill: Capillary refill takes less than 2 seconds.  Neurological:     General: No focal deficit present.     Mental Status: She is alert and oriented to person, place, and time.  Psychiatric:        Mood and Affect: Mood normal.        Behavior: Behavior normal.      ED Treatments / Results  Labs (all labs ordered are listed, but only abnormal results are displayed) Labs Reviewed  LACTIC ACID, PLASMA - Abnormal; Notable for the following components:      Result Value   Lactic Acid, Venous 2.3 (*)    All other components within normal limits  BLOOD GAS, VENOUS - Abnormal; Notable for the following components:   pCO2, Ven 39.7 (*)    All other components within normal limits  SARS CORONAVIRUS 2 (HOSPITAL ORDER, [facility])  CULTURE, BLOOD (ROUTINE X 2)  CULTURE, BLOOD (ROUTINE X 2)  URINE CULTURE  LACTIC ACID, PLASMA  URINALYSIS, ROUTINE W REFLEX MICROSCOPIC  I-STAT VENOUS BLOOD GAS, ED  CBG MONITORING, ED  TYPE AND SCREEN  PREPARE RBC (CROSSMATCH)    EKG EKG Interpretation  Date/Time:  Wednesday Jun 11 2018 11:01:02 EDT Ventricular Rate:  158 PR Interval:    QRS Duration: 84 QT Interval:  293 QTC Calculation: 475 R Axis:   65 Text Interpretation:  Sinus tachycardia Consider anterior infarct Since last tracing rate faster Confirmed by Isla Pence [phone]) on 06/11/2018 11:59:56 AM   Radiology Dg Chest Port 1 View  Result Date: 06/11/2018 CLINICAL DATA:  Hypotension. EXAM: PORTABLE CHEST 1 VIEW COMPARISON:  Radiograph of May 19, 2018. FINDINGS: Stable cardiomediastinal silhouette. Right internal jugular Port-A-Cath is unchanged in position. No pneumothorax is noted. Increased bibasilar opacities are noted concerning for pneumonia or edema. Small  pleural effusions may be present. Bony thorax is unremarkable. IMPRESSION: Increased bibasilar opacities are noted concerning for worsening pneumonia or edema. Small pleural effusions  may be present. Electronically Signed   By: Marijo Conception M.D.   On: 06/11/2018 11:43    Procedures Procedures (including critical care time)  Medications Ordered in ED Medications  0.9 %  sodium chloride infusion (Manually program via Guardrails IV Fluids) (has no administration in time range)  insulin aspart (novoLOG) injection 10 Units (has no administration in time range)  cefTRIAXone (ROCEPHIN) 1 g in sodium chloride 0.9 % 100 mL IVPB (has no administration in time range)  azithromycin (ZITHROMAX) 500 mg in sodium chloride 0.9 % 250 mL IVPB (has no administration in time range)  sodium chloride 0.9 % bolus 1,000 mL (0 mLs Intravenous Stopped 06/11/18 1304)     Initial Impression / Assessment and Plan / ED Course  I have reviewed the triage vital signs and the nursing notes.  Pertinent labs & imaging results that were available during my care of the patient were reviewed by me and considered in my medical decision making (see chart for details).     Pt has symptomatic anemia, so she was given 2 units prbcs.  Glucose is elevated, but she is not acidotic.  She is not in DKA.  She was given IVFs and 10 units insulin.  CBG ordered q1h.   BP is improved with IVFs.  HR has also responded.  Pt given IV rocephin/zithromax for pna on CXR.  I spoke with Dr. Alvy Bimler and she said pt has had these areas for a long time, so it may not be pna.   Pt is covid negative.  Pt d/w Dr. Roger Shelter (triad) for admission.  UA pending at admission.  CRITICAL CARE Performed by: Isla Pence   Total critical care time: 30 minutes  Critical care time was exclusive of separately billable procedures and treating other patients.  Critical care was necessary to treat or prevent imminent or life-threatening deterioration.  Critical care was time spent personally by me on the following activities: development of treatment plan with patient and/or surrogate as well as nursing, discussions with  consultants, evaluation of patient's response to treatment, examination of patient, obtaining history from patient or surrogate, ordering and performing treatments and interventions, ordering and review of laboratory studies, ordering and review of radiographic studies, pulse oximetry and re-evaluation of patient's condition.  HECTOR VENNE was evaluated in Emergency Department on 06/11/2018 for the symptoms described in the history of present illness. She was evaluated in the context of the global COVID-19 pandemic, which necessitated consideration that the patient might be at risk for infection with the SARS-CoV-2 virus that causes COVID-19. Institutional protocols and algorithms that pertain to the evaluation of patients at risk for COVID-19 are in a state of rapid change based on information released by regulatory bodies including the CDC and federal and state organizations. These policies and algorithms were followed during the patient's care in the ED.  Final Clinical Impressions(s) / ED Diagnoses   Final diagnoses:  Metastatic breast cancer ([place])  AKI (acute kidney injury) ([place])  Dehydration  Hyperglycemia  Symptomatic anemia  Thrombocytopenia [facility])    ED Discharge Orders    None       Isla Pence, MD 06/11/18 1306

## 2018-06-11 NOTE — ED Notes (Signed)
Date and time results received: 06/11/18 12:14 PM  (use smartphrase ".now" to insert current time)  Test: Lactic Critical Value: 2.3  Name of Provider Notified: [place]  Orders Received? Or Actions Taken?: awaiting orders

## 2018-06-11 NOTE — Progress Notes (Signed)
NAME:  Stacy Shelton, MRN:  [id number], DOB:  1960/06/02, LOS: 0 ADMISSION DATE:  06/11/2018, CONSULTATION DATE:  06/11/2018 REFERRING MD:  Gilford Rile, CHIEF COMPLAINT:  Hypotension.   Brief History   58 year old woman undergoing active chemotherapy for metastatic breast cancer who presents with hypotension and severe anemia.  History of present illness   Last received chemotherapy from Dr Ernst Spell on 4/29 (Taxotere, Herceptin, Perjeta) and was feeling well until she developed severe nausea and vomiting 2 days ago. Associated dyspnea started afterwards.  Past Medical History   Past Medical History:  Diagnosis Date  . Asthma   . Diabetes ([place])   . Metastatic breast cancer ([place])   . Seasonal allergies      Significant Hospital Events   Admitted from clinic 5/13 and given fluids and transfusion ordered.  Started on empiric antibiotics.  Consults:  PCCM  Procedures:  N/A  Significant Diagnostic Tests:  CXR 5/13 - mild bibasilar interstitial infiltrates and small effusions. CTA chest 5/13 - no PE.  Micro Data:  Blood cultures 5/13 - negative to date COVID 5/13 - negative   Antimicrobials:  Ceftriaxone/Azithromicin  Interim history/subjective:   presently complains predominantly of low back pain. Some increase in dyspnea. Denies chest pain.  Objective   Blood pressure (!) 109/40, pulse (!) 152, temperature 98.7 F (37.1 C), temperature source Oral, resp. rate (!) 44, height 5' 2"  (1.575 m), weight 59.9 kg, last menstrual period 10/30/2010, SpO2 97 %.        Intake/Output Summary (Last 24 hours) at 06/11/2018 1618 Last data filed at 06/11/2018 1515 Gross per 24 hour  Intake 1641 ml  Output -  Net 1641 ml   Filed Weights   06/11/18 1247  Weight: 59.9 kg    Examination: General: in no distress, pale. HENT: dry mucous membranes Lungs: clear. Cardiovascular: JVP at 4cm, S1/S2 normal, capillary refill normal, no edema. Abdomen: soft non-tender with no masses.  Extremities: no bruising or active joints. Neuro: No focal deficits GU: Foley place.  Resolved Hospital Problem list   N/A  Assessment & Plan:   Hypotension and tachypnea likely related to disease/chemotherapy related anemia.  Should improve with transfusion. Currently clinically well perfused, will follow lactate and hold vasopressors for now  Possible bibasilar pneumonia Cover with antibiotics.  Pancytopenia Transfuse 3 units of PRBC PLT only if active bleeding.   AKI likely hypovolemia related. Follow creatinine following fluid resuscitation.  Hyperglycemic non ketotic state May account for nausea.  Follow serial glucose and start IV insulin if fails to correct with fluid alone.  Metastatic breast cancer.  No active treatment at this time.  Best practice:  Diet: NPO for now Pain/Anxiety/Delirium protocol (if indicated): prn only. VAP protocol (if indicated): N/A DVT prophylaxis: Lovenox GI prophylaxis: N/A Glucose control: Phase 1  Mobility: bedrest/ Code Status: Full. Family Communication: communicated with patient only. Disposition: ICU  Labs   CBC: Recent Labs  Lab 06/11/18 0905  WBC 15.3*  NEUTROABS 10.2*  HGB 6.2*  HCT 20.0*  MCV 86.2  PLT 63*    Basic Metabolic Panel: Recent Labs  Lab 06/11/18 0905  NA 131*  K 5.1  CL 94*  CO2 23  GLUCOSE 514*  BUN 44*  CREATININE 1.63*  CALCIUM 9.2   GFR: Estimated Creatinine Clearance: 29.8 mL/min (A) (by C-G formula based on SCr of 1.63 mg/dL (H)). Recent Labs  Lab 06/11/18 0905 06/11/18 1055  WBC 15.3*  --   LATICACIDVEN  --  2.3*  Liver Function Tests: Recent Labs  Lab 06/11/18 0905  AST 11*  ALT 13  ALKPHOS 137*  BILITOT 1.3*  PROT 7.0  ALBUMIN 2.1*   No results for input(s): LIPASE, AMYLASE in the last 168 hours. No results for input(s): AMMONIA in the last 168 hours.  ABG    Component Value Date/Time   PHART 7.449 04/29/2018 0525   PCO2ART 38.8 04/29/2018 0525    PO2ART 84.2 04/29/2018 0525   HCO3 23.5 06/11/2018 1200   ACIDBASEDEF 0.8 06/11/2018 1200   O2SAT 23.5 06/11/2018 1200     Coagulation Profile: No results for input(s): INR, PROTIME in the last 168 hours.  Cardiac Enzymes: No results for input(s): CKTOTAL, CKMB, CKMBINDEX, TROPONINI in the last 168 hours.  HbA1C: Hgb A1c MFr Bld  Date/Time Value Ref Range Status  04/08/2018 02:16 AM 7.5 (H) 4.8 - 5.6 % Final    Comment:    (NOTE) Pre diabetes:          5.7%-6.4% Diabetes:              >6.4% Glycemic control for   <7.0% adults with diabetes   03/10/2018 11:50 PM 12.9 (H) 4.8 - 5.6 % Final    Comment:    (NOTE) Pre diabetes:          5.7%-6.4% Diabetes:              >6.4% Glycemic control for   <7.0% adults with diabetes     CBG: Recent Labs  Lab 06/11/18 1326 06/11/18 1523  GLUCAP 424* 383*    Review of Systems:   Negative apart from what is listed in HPI  Past Medical History  She,  has a past medical history of Asthma, Diabetes ([place]), Metastatic breast cancer ([place]), and Seasonal allergies.   Surgical History    Past Surgical History:  Procedure Laterality Date  . IR IMAGING GUIDED PORT INSERTION  03/24/2018  . KIDNEY STONE SURGERY    . SINOSCOPY    . WISDOM TOOTH EXTRACTION       Social History   reports that she has never smoked. She has never used smokeless tobacco. She reports current alcohol use. She reports that she does not use drugs.   Family History   Her family history is negative for Allergic rhinitis, Angioedema, Asthma, Atopy, Eczema, Immunodeficiency, and Urticaria.   Allergies Allergies  Allergen Reactions  . Nsaids Anaphylaxis     Home Medications  Prior to Admission medications   Medication Sig Start Date End Date Taking? Authorizing Provider  albuterol (PROAIR HFA) 108 (90 Base) MCG/ACT inhaler Inhale 2 puffs into the lungs every 6 (six) hours as needed for wheezing or shortness of breath. 05/16/17  Yes Padgett, Rae Halsted,  MD  esomeprazole (NEXIUM) 40 MG capsule Take 1 capsule (40 mg total) by mouth 2 (two) times daily before a meal. 05/16/17  Yes [place]gett, Rae Halsted, MD  fluticasone (FLOVENT HFA) 44 MCG/ACT inhaler Inhale 2 puffs into the lungs 2 (two) times daily. 05/16/17  Yes [place]gett, Rae Halsted, MD  insulin aspart (NOVOLOG) 100 UNIT/ML FlexPen Inject 8 Units into the skin 3 (three) times daily with meals. Patient taking differently: Inject 8 Units into the skin 3 (three) times daily with meals. Holds dose if not going to eat. 06/04/18  Yes Gorsuch, Ni, MD  Insulin Detemir (LEVEMIR) 100 UNIT/ML Pen Inject 10 Units into the skin daily. Patient taking differently: Inject 10 Units into the skin at bedtime.  05/06/18  Yes Dahal,  Marlowe Aschoff, MD  lidocaine-prilocaine (EMLA) cream Apply to affected area once Patient taking differently: Apply 1 application topically as needed (port access). Apply to affected area once 03/21/18  Yes Gorsuch, Ni, MD  LORazepam (ATIVAN) 0.5 MG tablet Take 1 tab po 30 minutes prior to radiation or MRI Patient taking differently: Take 0.5 mg by mouth See admin instructions. Take 30 minutes prior to radiation or MRI 03/14/18  Yes Hayden Pedro, PA-C  metFORMIN (GLUCOPHAGE) 500 MG tablet Take 500 mg by mouth daily with breakfast.    Yes [provider]  metoprolol succinate (TOPROL-XL) 50 MG 24 hr tablet Take 50 mg by mouth every evening. 03/10/18  Yes [provider]  montelukast (SINGULAIR) 10 MG tablet Take 1 tablet (10 mg total) by mouth daily. 06/10/18  Yes [place], Rae Halsted, MD  morphine (MSIR) 15 MG tablet Take 1 tablet (15 mg total) by mouth every 4 (four) hours as needed for severe pain. 05/20/18  Yes [name], MD  Multiple Vitamin (MULTIVITAMIN WITH MINERALS) TABS tablet Take 1 tablet by mouth daily. 05/07/18  Yes Dahal, Marlowe Aschoff, MD  ondansetron (ZOFRAN) 8 MG tablet Take 8 mg by mouth every 8 (eight) hours as needed for nausea or vomiting.    Yes  [provider]  prochlorperazine (COMPAZINE) 10 MG tablet Take 10 mg by mouth every 6 (six) hours as needed for nausea or vomiting.    Yes [provider]  ACCU-CHEK AVIVA PLUS test strip  03/13/18   [provider]  ACCU-CHEK SOFTCLIX LANCETS lancets  03/13/18   [provider]  blood glucose meter kit and supplies KIT Dispense based on patient and insurance preference. Use up to four times daily as directed. (FOR ICD-9 250.00, 250.01). 03/13/18   Donne Hazel, MD  Insulin Pen Needle 31G X 5 MM MISC 1 Device by Does not apply route QID. For use with insulin pens 03/13/18   Donne Hazel, MD     Critical care time: [place], MD [facility] ICU Physician [place]  Pager: [phone] Mobile: [phone] After hours: [phone].  06/11/2018, 5:15 PM

## 2018-06-11 NOTE — Progress Notes (Signed)
Pt's heart rate 150's sinus tach, CCM at bedside. Pt going to CT on telemetry and 2 liters nasal cannula

## 2018-06-11 NOTE — Progress Notes (Signed)
eLink Physician-Brief Progress Note Patient Name: Stacy Shelton DOB: [birth year] MRN: [id number]   Date of Service  06/11/2018  HPI/Events of Note  Metastatic breast cancer receiving chemotherapy, patient with SVT rate 180, hemodynamically stable currently but tachypnic.  eICU Interventions  Adenosine 6 mg iv x 1, check electrolytes, Echo in AM        [place] 06/11/2018, 11:33 PM

## 2018-06-11 NOTE — Assessment & Plan Note (Signed)
She has severe acute renal failure, likely due to recent severe dehydration and possibly DKA She is started on IV fluid resuscitation

## 2018-06-11 NOTE — ED Notes (Signed)
Pt receiving blood through port and is a difficult stick, unable to obtain accurate blood-work at this time.

## 2018-06-11 NOTE — Assessment & Plan Note (Signed)
She has severe, uncontrolled diabetes and concern right now with her recent nausea, vomiting and acute renal failure, whether she might have diabetic ketoacidosis I discussed this with the hospitalist She needs to be started with insulin drip immediately and she is directed to the emergency department IV fluid hydration is started in the cancer center

## 2018-06-11 NOTE — Progress Notes (Signed)
CRITICAL VALUE ALERT  Critical Value:  LA=2.6  Date & Time Notied:  06/11/18 1730  Provider Notified:Dr Shahmehdi  Orders Received/Actions taken: no new orders received

## 2018-06-11 NOTE — Assessment & Plan Note (Signed)
She has severe pancytopenia and symptomatic from her severe anemia I recommend 2 units of blood transfusion This is ordered I discussed with the blood bank and they would direct the blood to emergency room so that she can be transfused as soon as possible Her platelet count is low but stable at her baseline She does not need platelet transfusion unless she is bleeding or platelet count less than 10,000

## 2018-06-11 NOTE — ED Triage Notes (Signed)
Pt from [facility] center.  Pt hypotensive, last pressure was 78/45.  RR 44.  Pt was hyperglycemic at 514.  Pt hx of metastatic breast CA.

## 2018-06-11 NOTE — Assessment & Plan Note (Signed)
The patient has significant concern about being admitted I discussed with the patient the reason for admission due to cardiovascular instability and severe blood count abnormalities I have discussed with her sister who is her principal caregiver and next of kin and she agreed with the plan of care After all test results are available, the patient agreed to be admitted However, based on discussion with hospitalist, she needs to be stabilized in the emergency department first before admission to the floor

## 2018-06-11 NOTE — Assessment & Plan Note (Signed)
Currently, she is unstable I discussed with the patient and her sister and she agreed to be admitted After discussion with the hospitalist, she is directed to the emergency department for aggressive care and resuscitation

## 2018-06-11 NOTE — ED Notes (Signed)
Pt's LUA IV infiltrated.  Had to move pt's blood infusion to her port.

## 2018-06-11 NOTE — Progress Notes (Signed)
Pharmacy - SQ heparin  Assessment: Pharmacy consulted to dose heparin for VTE ppx in 34 yoF with metastatic breast CA admitted for pancytopenia, hypotension, AKI, hyperglycemia. Currently receiving PRBC x 2 for Hgb 6.2; Plt 63.  Plan: D/w MD, will hold heparin for now; expect Hgb will improve with blood admin, but will wait for Plt to improve or at least stabilize with no bleeding before restarting anticoagulation. SCDs ordered  Reuel Boom, PharmD, [place] 06/11/2018, 2:06 PM

## 2018-06-11 NOTE — H&P (Signed)
History and Physical   Patient: Stacy Shelton                            PCP: Robyne Peers, MD                    DOB: 20-May-1960            DOA: 06/11/2018 RXV:[id number]             DOS: 06/11/2018, 1:22 PM  Patient coming from:   Home  I have personally reviewed patient's medical records, in electronic medical records, including: [place] [facility], and care everywhere.    Chief Complaint:   Chief Complaint  Patient presents with  . Hypotension  . Hyperglycemia    History of present illness:    Stacy Shelton is a 58 y.o. female with medical history significant of metastatic breast cancer, brain lung and liver, patient of neurologist Dr. Alvy Bimler.   Resented initially at her oncologist, cancer center for persistent nausea vomiting, initial labs revealed hemoglobin 6.2, creatinine 1.63, hypotensive, with blood sugars of 514.  Patient was subsequently directed to ED for immediate evaluation, labs and work-up confirmed anemia, hyperglycemia, hypotension. She was immediately resuscitated with fluids, initiated on 2 units of packed red blood cell.  Currently her only complaint is generalized weaknesses, with some nausea vomiting.  Stating that she is falling apart.  Reporting and denying of having any fever cough or congestion.  Stating that she did not take her diabetic medication this morning.  As she was not able to tolerate p.o.  Currently stable aside from generalized weaknesses, some nausea vomiting denies of having a fever, chills.  Denies any upper respiratory symptoms.  Denies any chest pain or shortness of breath.  Denies any abdominal pain.  Denies any headaches visual change or asymmetric weaknesses.  Just generalized weaknesses.  Denies any dysuria.  Denies any joint pain.  Denies any open wounds or rash.  COVID-19 testing -3 weeks ago, once again negative today.  SARS-CoV-2 target nucleic acids are NOT DETECTED.   ED Course:   Upon evaluation ED temp 98.1, pulse ranging 75-1  63, currently 75  Blood pressure is low 76/55 currently 88/51, satting 97% on room air, Hemoglobin 6.2, platelets 63, WBC 15.3, BUN 44, creatinine 1.63, glucose 514, lactic acid 2.3 CXR revealed bilateral lower lobe opacity, pleural effusion  Patient currently receiving blood transfusion, Blood cultures has been obtained Has been initiated on IV antibiotics of Rocephin and azithromycin  Review of Systems: As per HPI otherwise 12 point review of systems negative.    Assessment / Plan:   Active Problems:    Acute on chronic Severe anemia/   Anemia associated with chemotherapy -Patient follows up with oncologist closely, -Currently hemoglobin on admission 6.2, she is currently being transfused with 2 units of packed red blood cell -Monitoring H&H closely   Pancytopenia /   Chemotherapy-induced thrombocytopenia -Monitoring closely, no signs of bleeding -Onc. following closely as out patient  -Platelets 63  Sepsis ([place]) / Pneumonia due to infectious organism  -Ruling out sepsis, infection  -We will monitor closely, repeating lactic acid, procalcitonin level, -Currently afebrile, hypotensive,  leukocytosis, lactic acid elevated 2.3  (Finding may be consistent with dehydration, severe anemia, reactive leukocytosis -but we cannot rule out sepsis at this time) - CXR finding consistent with pneumonia, may be chronic changes due to lung mets, and dCHF -We will follow-up with  a CTA of the chest -Blood cultures has been obtained in ED will follow closely -Patient was started on IV Rocephin and azithromycin will be continued for now   Hypotensive -Likely due to severe anemia dehydration (ruling out sepsis) -We will monitor very closely, continue with blood transfusion, gentle IV fluid hydration -Holding home medication of narcotics and ancillary meds may be contributing to hypertension at this time.   acute renal failure (ARF) (HCC) -Likely  due to dehydration, severe anemia -Monitoring  closely, -Avoiding nephrotoxins,   Nausea/vomiting/dehydration -Continue gentle IV fluid hydration -Continue IV n.p.o. 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 Alvy Bimler. -Last chemotherapy 05/28/2018.     Chronic diastolic CHF (congestive heart failure) (HCC) -Monitoring closely, -Patient currently dehydrated, hypotensive, with severe anemia, received 2 units of packed red blood cells, -Initiate gentle IV fluid hydration, monitor blood pressure closely -Monitor daily weight, respiratory efforts,  -2D echocardiogram reviewed from 03/21/2018: Action fraction 60%   Cancer associated pain -She is on scheduled morphine by her oncologist, on hold for now due to hypotension -PRN analgesics as blood pressure improves  GERD - cont. PPI   History of asthma -She not complaining of shortness of breath, no signs of hypoxia, -Tachypneic, tachycardia likely due to hypotension, severe anemia -Continue to monitoring, as needed Xopenex, O2 by nasal cannula -Any home medication including Singulair  Tachypnea/tachycardia/ sat 92% on room air -Most likely due to severe anemia, hypotension, but we cannot rule out PE Is high risk due to metastatic cancer -We will order d-dimer and a spoke to radiologist Dr. Leonia Reeves he is recommending CT of the chest versus VQ scan, stating GFR is low creatinine not elevated much lower dose of dye will be given. VQ scan would not be helpful due to patient's underlying cancer mets to the lungs -We will proceed with a CTA of the chest    DVT prophylaxis: SCD/Compression stockings (no signs of bleeding, considering subcu  heparin)  Code Status:   Code Status: Prior full code, once again was discussed with the patient in detail on her CODE STATUS she wishes to be remain as full code  Family Communication:  The above findings and plan of care has been discussed with patient and family in detail, they expressed understanding and agreement of above plan.   Disposition Plan: >3 days  Consults called:  None  Admission status: Patient will be admitted as Inpatient, with a greater than 2 midnight length of stay.  Chest x-ray 06/11/2018 iMPRESSION: Increased bibasilar opacities are noted concerning for worsening pneumonia or edema. Small pleural effusions may be present.  Echo 03/21/2018 : IMPRESSIONS   1. The left ventricle has normal systolic function with an ejection fraction of 60-65%. The cavity size was normal. Left ventricular diastolic Doppler parameters are consistent with impaired relaxation.  2. The right ventricle has normal systolic function. The cavity was normal. There is no increase in right ventricular wall thickness.  3. The mitral valve is normal in structure.  4. The tricuspid valve is normal in structure.  5. Strain imaging performed but not reported due to interpreter judgement, secondary to suboptimal image quality.   ----------------------------------------------------------------------------------------------------------------------  Allergies  Allergen Reactions  .  Nsaids Anaphylaxis    Home MEDs:  Prior to Admission medications   Medication Sig Start Date End Date Taking? Authorizing Provider  albuterol (PROAIR HFA) 108 (90 Base) MCG/ACT inhaler Inhale 2 puffs into the lungs every 6 (six) hours as needed for wheezing or shortness of breath. 05/16/17  Yes Padgett, Rae Halsted, MD  esomeprazole (NEXIUM) 40 MG capsule Take 1 capsule (40 mg total) by mouth 2 (two) times daily before a meal. 05/16/17  Yes Padgett, Rae Halsted, MD  fluticasone (FLOVENT HFA) 44 MCG/ACT inhaler  Inhale 2 puffs into the lungs 2 (two) times daily. 05/16/17  Yes Padgett, Rae Halsted, MD  insulin aspart (NOVOLOG) 100 UNIT/ML FlexPen Inject 8 Units into the skin 3 (three) times daily with meals. Patient taking differently: Inject 8 Units into the skin 3 (three) times daily with meals. Holds dose if not going to eat. 06/04/18  Yes Gorsuch, Ni, MD  Insulin Detemir (LEVEMIR) 100 UNIT/ML Pen Inject 10 Units into the skin daily. Patient taking differently: Inject 10 Units into the skin at bedtime.  05/06/18  Yes Dahal, Marlowe Aschoff, MD  lidocaine-prilocaine (EMLA) cream Apply to affected area once Patient taking differently: Apply 1 application topically as needed (port access). Apply to affected area once 03/21/18  Yes Gorsuch, Ni, MD  LORazepam (ATIVAN) 0.5 MG tablet Take 1 tab po 30 minutes prior to radiation or MRI Patient taking differently: Take 0.5 mg by mouth See admin instructions. Take 30 minutes prior to radiation or MRI 03/14/18  Yes Hayden Pedro, PA-C  metFORMIN (GLUCOPHAGE) 500 MG tablet Take 500 mg by mouth daily with breakfast.    Yes [provider]  metoprolol succinate (TOPROL-XL) 50 MG 24 hr tablet Take 50 mg by mouth every evening. 03/10/18  Yes [provider]  montelukast (SINGULAIR) 10 MG tablet Take 1 tablet (10 mg total) by mouth daily. 06/10/18  Yes Padgett, Rae Halsted, MD  morphine (MSIR) 15 MG tablet Take 1 tablet (15 mg total) by mouth every 4 (four) hours as needed for severe pain. 05/20/18  Yes [name], MD  Multiple Vitamin (MULTIVITAMIN WITH MINERALS) TABS tablet Take 1 tablet by mouth daily. 05/07/18  Yes Dahal, Marlowe Aschoff, MD  ondansetron (ZOFRAN) 8 MG tablet Take 8 mg by mouth every 8 (eight) hours as needed for nausea or vomiting.    Yes [provider]  prochlorperazine (COMPAZINE) 10 MG tablet Take 10 mg by mouth every 6 (six) hours as needed for nausea or vomiting.    Yes [provider]  ACCU-CHEK AVIVA PLUS test strip   03/13/18   [provider]  ACCU-CHEK SOFTCLIX LANCETS lancets  03/13/18   [provider]  blood glucose meter kit and supplies KIT Dispense based on patient and insurance preference. Use up to four times daily as directed. (FOR ICD-9 250.00, 250.01). 03/13/18   Donne Hazel, MD  Insulin Pen Needle 31G X 5 MM MISC 1 Device by Does not apply route QID. For use with insulin pens 03/13/18   Donne Hazel, MD    PRN MEDs:   Past Medical History:  Diagnosis Date  . Asthma   . Diabetes ([place])   . Metastatic breast cancer ([place])   . Seasonal allergies     Past Surgical History:  Procedure Laterality Date  . IR IMAGING GUIDED PORT INSERTION  03/24/2018  . KIDNEY STONE SURGERY    . SINOSCOPY    . WISDOM TOOTH EXTRACTION       reports that  she has never smoked. She has never used smokeless tobacco. She reports current alcohol use. She reports that she does not use drugs.   Family History  Problem Relation Age of Onset  . Allergic rhinitis Neg Hx   . Angioedema Neg Hx   . Asthma Neg Hx   . Atopy Neg Hx   . Eczema Neg Hx   . Immunodeficiency Neg Hx   . Urticaria Neg Hx     Physical Exam:   Vitals:   06/11/18 1247 06/11/18 1247 06/11/18 1300 06/11/18 1304  BP:   (!) 88/51   Pulse:   75   Resp:   (!) 43   Temp:    98.1 F (36.7 C)  TempSrc:    Oral  SpO2:  95% 97%   Weight: 59.9 kg     Height: _0  (1.575 m)       Constitutional: NAD, calm, comfortable, pale looking, mildly lethargic Vitals:   06/11/18 1247 06/11/18 1247 06/11/18 1300 06/11/18 1304  BP:   (!) 88/51   Pulse:   75   Resp:   (!) 43   Temp:    98.1 F (36.7 C)  TempSrc:    Oral  SpO2:  95% 97%   Weight: 59.9 kg     Height: _1  (1.575 m)      Eyes: PERRL, lids and conjunctivae normal ENMT: Mucous membranes are moist. Posterior pharynx clear of any exudate or lesions.Normal dentition.  Neck: normal, supple, no masses, no thyromegaly Respiratory: clear to auscultation bilaterally,  no wheezing, no crackles. Normal respiratory effort. No accessory muscle use.  Cardiovascular: Regular rate and rhythm, no murmurs / rubs / gallops. No extremity edema. 2+ pedal pulses. No carotid bruits.  Abdomen: no tenderness, no masses palpated. No hepatosplenomegaly. Bowel sounds positive.  Musculoskeletal: no clubbing / cyanosis. No joint deformity upper and lower extremities. Good ROM, no contractures. Normal muscle tone.  Neurologic: CN II-XII grossly intact. Sensation intact, DTR normal. Strength 5/5 in all 4.  Psychiatric: Normal judgment and insight. Alert and oriented x 3. Normal mood.  Skin: no rashes, lesions, ulcers. No induration Decubitus/ulcers: No obvious open wounds or ulcers, port present on chest Urinary catheter: Chronic indwelling/was placed in this admission  Labs on admission:    I have personally reviewed following labs and imaging studies  CBC: Recent Labs  Lab 06/11/18 0905  WBC 15.3*  NEUTROABS 10.2*  HGB 6.2*  HCT 20.0*  MCV 86.2  PLT 63*   Basic Metabolic Panel: Recent Labs  Lab 06/11/18 0905  NA 131*  K 5.1  CL 94*  CO2 23  GLUCOSE 514*  BUN 44*  CREATININE 1.63*  CALCIUM 9.2   GFR: Estimated Creatinine Clearance: 29.8 mL/min (A) (by C-G formula based on SCr of 1.63 mg/dL (H)). Liver Function Tests: Recent Labs  Lab 06/11/18 0905  AST 11*  ALT 13  ALKPHOS 137*  BILITOT 1.3*  PROT 7.0  ALBUMIN 2.1*   Urine analysis:    Component Value Date/Time   COLORURINE AMBER (A) 05/20/2018 0922   APPEARANCEUR HAZY (A) 05/20/2018 0922   LABSPEC 1.023 05/20/2018 0922   PHURINE 5.0 05/20/2018 0922   GLUCOSEU NEGATIVE 05/20/2018 0922   HGBUR NEGATIVE 05/20/2018 0922   BILIRUBINUR NEGATIVE 05/20/2018 0922   KETONESUR 5 (A) 05/20/2018 0922   PROTEINUR NEGATIVE 05/20/2018 0922   NITRITE NEGATIVE 05/20/2018 0922   LEUKOCYTESUR SMALL (A) 05/20/2018 0922     Radiologic Exams on Admission:   Dg Chest  Port 1 View  Result Date: 06/11/2018  CLINICAL DATA:  Hypotension. EXAM: PORTABLE CHEST 1 VIEW COMPARISON:  Radiograph of May 19, 2018. FINDINGS: Stable cardiomediastinal silhouette. Right internal jugular Port-A-Cath is unchanged in position. No pneumothorax is noted. Increased bibasilar opacities are noted concerning for pneumonia or edema. Small pleural effusions may be present. Bony thorax is unremarkable. IMPRESSION: Increased bibasilar opacities are noted concerning for worsening pneumonia or edema. Small pleural effusions may be present. Electronically Signed   By: Marijo Conception M.D.   On: 06/11/2018 11:43    EKG:   Independently reviewed.   Orders placed or performed during the hospital encounter of 06/11/18  . ED EKG 12-Lead  . ED EKG 12-Lead     Time spent: > than  55 Min.   Deatra James MD [facility] ,  Pager [phone]  If 7PM-7AM, please contact night-coverage [email or web address]  Password [email or web address] 06/11/2018, 1:22 PM

## 2018-06-11 NOTE — ED Notes (Signed)
Attempted to call report

## 2018-06-11 NOTE — Telephone Encounter (Signed)
I updated her sister and check on the patient just now in the emergency room I have also reviewed the plan of care with the [facility] emergency department physician The patient will be admitted to the hospital for antibiotics, insulin drip and aggressive supportive care She is receiving blood transfusion support We will recheck her blood work tomorrow Her sister is concerned that the patient is suffering from depression I recommend a trial of Remeron daily and I will get social worker in the outpatient clinic to follow the patient

## 2018-06-11 NOTE — Significant Event (Signed)
PCCM Significant Event   Called to evaluate pt at bedside in SVT At 23:21 pt was in HR 174 bpm per EKG.  QTc 445 QRS 67ms  In SVT Pacer pads were placed by RN staff Elink>>> managing while en route Pt received Adenosine 6 mg x 1 Converted to HR 120 bpm initially NSR Hemodynamically stable and O2 sat 95% on 3L [place] Pt denies any complaints of chest pain, or SOB Does have a fever   Reviewed Labs: Corrected Calcium, Mg and Phos wnl Will add on K+ Pt has received 3 u PRBCs today and Hgb 8.3 on most recent CBC  Reviewed Home meds Pt is normally on Metoprolol 50 mg daily which was held due to hypotension earlier.   Plan:  will continue on Cardiac monitoring Continue IV Abx Temp mgmt w/ prn Tylenol and passive ext cooling. repeat EKG at 23:58 Afib RVR 124  0014 back in sinus at 120 w/SBP 130s  Signed Dr Seward Carol [facility] Critical [facility] Locums

## 2018-06-12 ENCOUNTER — Inpatient Hospital Stay (HOSPITAL_COMMUNITY): Payer: BLUE CROSS/BLUE SHIELD

## 2018-06-12 ENCOUNTER — Other Ambulatory Visit: Payer: Self-pay

## 2018-06-12 DIAGNOSIS — R739 Hyperglycemia, unspecified: Secondary | ICD-10-CM

## 2018-06-12 DIAGNOSIS — E43 Unspecified severe protein-calorie malnutrition: Secondary | ICD-10-CM

## 2018-06-12 DIAGNOSIS — I959 Hypotension, unspecified: Secondary | ICD-10-CM

## 2018-06-12 DIAGNOSIS — D61818 Other pancytopenia: Secondary | ICD-10-CM

## 2018-06-12 DIAGNOSIS — Z6824 Body mass index (BMI) 24.0-24.9, adult: Secondary | ICD-10-CM

## 2018-06-12 DIAGNOSIS — C799 Secondary malignant neoplasm of unspecified site: Secondary | ICD-10-CM

## 2018-06-12 DIAGNOSIS — C50911 Malignant neoplasm of unspecified site of right female breast: Secondary | ICD-10-CM

## 2018-06-12 LAB — COMPREHENSIVE METABOLIC PANEL
ALT: 12 U/L (ref 0–44)
AST: 14 U/L — ABNORMAL LOW (ref 15–41)
Albumin: 1.9 g/dL — ABNORMAL LOW (ref 3.5–5.0)
Alkaline Phosphatase: 98 U/L (ref 38–126)
Anion gap: 9 (ref 5–15)
BUN: 41 mg/dL — ABNORMAL HIGH (ref 6–20)
CO2: 23 mmol/L (ref 22–32)
Calcium: 7.8 mg/dL — ABNORMAL LOW (ref 8.9–10.3)
Chloride: 105 mmol/L (ref 98–111)
Creatinine, Ser: 0.91 mg/dL (ref 0.44–1.00)
GFR calc Af Amer: >60 mL/min (ref >60)
GFR calc non Af Amer: >60 mL/min (ref >60)
Glucose, Bld: 85 mg/dL (ref 70–99)
Potassium: 3.7 mmol/L (ref 3.5–5.1)
Sodium: 137 mmol/L (ref 135–145)
Total Bilirubin: 1.6 mg/dL — ABNORMAL HIGH (ref 0.3–1.2)
Total Protein: 5.6 g/dL — ABNORMAL LOW (ref 6.5–8.1)

## 2018-06-12 LAB — CBC WITH DIFFERENTIAL/PLATELET
Abs Immature Granulocytes: 2.14 10*3/uL — ABNORMAL HIGH (ref 0.00–0.07)
Basophils Absolute: 0.1 10*3/uL (ref 0.0–0.1)
Basophils Relative: 2 %
Eosinophils Absolute: 0.1 10*3/uL (ref 0.0–0.5)
Eosinophils Relative: 1 %
HCT: 23.5 % — ABNORMAL LOW (ref 36.0–46.0)
Hemoglobin: 7.8 g/dL — ABNORMAL LOW (ref 12.0–15.0)
Immature Granulocytes: 22 %
Lymphocytes Relative: 13 %
Lymphs Abs: 1.3 10*3/uL (ref 0.7–4.0)
MCH: 28.7 pg (ref 26.0–34.0)
MCHC: 33.2 g/dL (ref 30.0–36.0)
MCV: 86.4 fL (ref 80.0–100.0)
Monocytes Absolute: 0.5 10*3/uL (ref 0.1–1.0)
Monocytes Relative: 5 %
Neutro Abs: 5.5 10*3/uL (ref 1.7–7.7)
Neutrophils Relative %: 57 %
Platelets: 39 10*3/uL — ABNORMAL LOW (ref 150–400)
RBC: 2.72 MIL/uL — ABNORMAL LOW (ref 3.87–5.11)
RDW: 15.5 % (ref 11.5–15.5)
WBC: 9.6 10*3/uL (ref 4.0–10.5)
nRBC: 0.8 % — ABNORMAL HIGH (ref 0.0–0.2)

## 2018-06-12 LAB — BPAM RBC
Blood Product Expiration Date: 202005292359
Blood Product Expiration Date: 202005312359
ISSUE DATE / TIME: 202005131208
ISSUE DATE / TIME: 202005131652
Unit Type and Rh: 6200
Unit Type and Rh: 6200

## 2018-06-12 LAB — URINALYSIS, ROUTINE W REFLEX MICROSCOPIC
Bilirubin Urine: NEGATIVE
Glucose, UA: 50 mg/dL — AB
Ketones, ur: NEGATIVE mg/dL
Nitrite: NEGATIVE
Protein, ur: 30 mg/dL — AB
RBC / HPF: >50 RBC/hpf — ABNORMAL HIGH (ref 0–5)
Specific Gravity, Urine: 1.021 (ref 1.005–1.030)
pH: 5 (ref 5.0–8.0)

## 2018-06-12 LAB — CBC
HCT: 25.7 % — ABNORMAL LOW (ref 36.0–46.0)
Hemoglobin: 8.5 g/dL — ABNORMAL LOW (ref 12.0–15.0)
MCH: 28.5 pg (ref 26.0–34.0)
MCHC: 33.1 g/dL (ref 30.0–36.0)
MCV: 86.2 fL (ref 80.0–100.0)
Platelets: 41 10*3/uL — ABNORMAL LOW (ref 150–400)
RBC: 2.98 MIL/uL — ABNORMAL LOW (ref 3.87–5.11)
RDW: 15.9 % — ABNORMAL HIGH (ref 11.5–15.5)
WBC: 11.1 10*3/uL — ABNORMAL HIGH (ref 4.0–10.5)
nRBC: 1.1 % — ABNORMAL HIGH (ref 0.0–0.2)

## 2018-06-12 LAB — TYPE AND SCREEN
ABO/RH(D): A POS
Antibody Screen: NEGATIVE
Unit division: 0
Unit division: 0

## 2018-06-12 LAB — GLUCOSE, CAPILLARY
Glucose-Capillary: 144 mg/dL — ABNORMAL HIGH (ref 70–99)
Glucose-Capillary: 168 mg/dL — ABNORMAL HIGH (ref 70–99)
Glucose-Capillary: 127 mg/dL — ABNORMAL HIGH (ref 70–99)
Glucose-Capillary: 165 mg/dL — ABNORMAL HIGH (ref 70–99)

## 2018-06-12 LAB — HEMOGLOBIN A1C
Hgb A1c MFr Bld: 6.7 % — ABNORMAL HIGH (ref 4.8–5.6)
Mean Plasma Glucose: 145.59 mg/dL

## 2018-06-12 LAB — HEMOGLOBIN AND HEMATOCRIT, BLOOD
HCT: 25.9 % — ABNORMAL LOW (ref 36.0–46.0)
HCT: 25.4 % — ABNORMAL LOW (ref 36.0–46.0)
HCT: 26.8 % — ABNORMAL LOW (ref 36.0–46.0)
Hemoglobin: 8.6 g/dL — ABNORMAL LOW (ref 12.0–15.0)
Hemoglobin: 8.4 g/dL — ABNORMAL LOW (ref 12.0–15.0)
Hemoglobin: 8.6 g/dL — ABNORMAL LOW (ref 12.0–15.0)

## 2018-06-12 LAB — POTASSIUM: Potassium: 3.4 mmol/L — ABNORMAL LOW (ref 3.5–5.1)

## 2018-06-12 LAB — APTT: aPTT: 32 seconds (ref 24–36)

## 2018-06-12 LAB — PROTIME-INR
INR: 1.5 — ABNORMAL HIGH (ref 0.8–1.2)
Prothrombin Time: 17.6 seconds — ABNORMAL HIGH (ref 11.4–15.2)

## 2018-06-12 LAB — PROCALCITONIN: Procalcitonin: 32.72 ng/mL

## 2018-06-12 MED ORDER — POTASSIUM CHLORIDE 10 MEQ/50ML IV SOLN
10.0000 meq | INTRAVENOUS | Status: AC
  Administered 2018-06-12 (×2): 10 meq via INTRAVENOUS
  Filled 2018-06-12 (×2): qty 50

## 2018-06-12 MED ORDER — AMIODARONE HCL IN DEXTROSE 360-4.14 MG/200ML-% IV SOLN
30.0000 mg/h | INTRAVENOUS | Status: DC
  Administered 2018-06-12 – 2018-06-13 (×2): 30 mg/h via INTRAVENOUS
  Filled 2018-06-12 (×2): qty 200

## 2018-06-12 MED ORDER — METOPROLOL TARTRATE 5 MG/5ML IV SOLN
5.0000 mg | Freq: Once | INTRAVENOUS | Status: AC
  Administered 2018-06-12: 5 mg via INTRAVENOUS
  Filled 2018-06-12: qty 5

## 2018-06-12 MED ORDER — POTASSIUM CHLORIDE CRYS ER 20 MEQ PO TBCR
40.0000 meq | EXTENDED_RELEASE_TABLET | Freq: Once | ORAL | Status: DC
  Filled 2018-06-12: qty 2

## 2018-06-12 MED ORDER — AMIODARONE HCL IN DEXTROSE 360-4.14 MG/200ML-% IV SOLN
60.0000 mg/h | INTRAVENOUS | Status: AC
  Administered 2018-06-12: 08:00:00 60 mg/h via INTRAVENOUS
  Filled 2018-06-12: qty 200

## 2018-06-12 MED ORDER — AMIODARONE IV BOLUS ONLY 150 MG/100ML
INTRAVENOUS | Status: AC
  Administered 2018-06-12: 08:00:00 150 mg
  Filled 2018-06-12: qty 100

## 2018-06-12 MED ORDER — PHENYLEPHRINE HCL-NACL 10-0.9 MG/250ML-% IV SOLN
0.0000 ug/min | INTRAVENOUS | Status: DC
  Administered 2018-06-12: 20 ug/min via INTRAVENOUS
  Administered 2018-06-12: 05:00:00 90 ug/min via INTRAVENOUS
  Administered 2018-06-12: 50 ug/min via INTRAVENOUS
  Administered 2018-06-13: 20 ug/min via INTRAVENOUS
  Filled 2018-06-12 (×4): qty 250

## 2018-06-12 MED ORDER — FUROSEMIDE 10 MG/ML IJ SOLN
40.0000 mg | Freq: Once | INTRAMUSCULAR | Status: AC
  Administered 2018-06-12: 40 mg via INTRAVENOUS
  Filled 2018-06-12: qty 4

## 2018-06-12 MED ORDER — ENSURE ENLIVE PO LIQD
237.0000 mL | Freq: Two times a day (BID) | ORAL | Status: DC
  Administered 2018-06-13 – 2018-06-17 (×9): 237 mL via ORAL

## 2018-06-12 MED ORDER — SODIUM CHLORIDE 3 % IN NEBU
4.0000 mL | INHALATION_SOLUTION | Freq: Every day | RESPIRATORY_TRACT | Status: AC
  Administered 2018-06-12 – 2018-06-14 (×3): 4 mL via RESPIRATORY_TRACT
  Filled 2018-06-12 (×3): qty 4

## 2018-06-12 MED ORDER — GUAIFENESIN 100 MG/5ML PO SOLN
5.0000 mL | ORAL | Status: DC | PRN
  Administered 2018-06-12 – 2018-06-14 (×3): 100 mg via ORAL
  Filled 2018-06-12 (×3): qty 10

## 2018-06-12 MED ORDER — DILTIAZEM HCL 100 MG IV SOLR
5.0000 mg/h | INTRAVENOUS | Status: DC
  Administered 2018-06-12: 15:00:00 5 mg/h via INTRAVENOUS
  Administered 2018-06-12: 22:00:00 15 mg/h via INTRAVENOUS
  Filled 2018-06-12 (×2): qty 100

## 2018-06-12 MED ORDER — FUROSEMIDE 10 MG/ML IJ SOLN
40.0000 mg | Freq: Once | INTRAMUSCULAR | Status: AC
  Administered 2018-06-12: 10:00:00 40 mg via INTRAVENOUS
  Filled 2018-06-12: qty 4

## 2018-06-12 MED ORDER — LACTATED RINGERS IV SOLN
INTRAVENOUS | Status: DC
  Administered 2018-06-12 – 2018-06-21 (×3): via INTRAVENOUS

## 2018-06-12 MED ORDER — AMIODARONE IV BOLUS ONLY 150 MG/100ML
150.0000 mg | Freq: Once | INTRAVENOUS | Status: AC
  Administered 2018-06-12: 150 mg via INTRAVENOUS
  Filled 2018-06-12: qty 100

## 2018-06-12 MED ORDER — POTASSIUM CHLORIDE 20 MEQ/15ML (10%) PO SOLN
40.0000 meq | Freq: Once | ORAL | Status: AC
  Administered 2018-06-12: 40 meq via ORAL
  Filled 2018-06-12: qty 30

## 2018-06-12 NOTE — Progress Notes (Addendum)
NAME:  Stacy Shelton, MRN:  [id number], DOB:  01-Mar-1960, LOS: 1 ADMISSION DATE:  06/11/2018, CONSULTATION DATE:  06/11/2018 REFERRING MD:  Gilford Rile, CHIEF COMPLAINT:  Hypotension.   Brief History   58 year old woman undergoing active chemotherapy for metastatic breast cancer who presents with hypotension and severe anemia.  Last received chemotherapy from Dr Ernst Spell on 4/29 (Taxotere, Herceptin, Perjeta) and was feeling well until she developed severe nausea and vomiting 2 days ago. Associated dyspnea started afterwards.  Past Medical History  Asthma, DM, metastatic breast CA, seasonal allergies    Significant Hospital Events   Admitted from clinic 5/13 and given fluids and transfusion ordered.  Started on empiric antibiotics. 5/13->5/14: Hemoglobin is low at 6.2.  Received a total of 3 units PRBC.  Febrile overnight, max 103.1, during evening developed SVT.  Treated initially with adenosine, initially converted to sinus tachycardia.  Electrolytes corrected, fever treated, required phenylephrine infusion for hypotension, eventually added amiodarone infusion, white cell count trending down.  Hemoglobin 7.8.  Procalcitonin actually increased from day prior from 31.64-32.72.  Oxygen requirements: Remain at 3 L, this is her baseline oxygen however she does report worsening resting dyspnea.  Receiving a second bolus of amiodarone Consults:  PCCM  Procedures:  N/A  Significant Diagnostic Tests:  CXR 5/13 - mild bibasilar interstitial infiltrates and small effusions. CTA chest 5/13 - no PE.  Micro Data:  Blood cultures 5/13 - negative to date COVID 5/13 - negative   Antimicrobials:  Ceftriaxone 5/13>>>  Azithromycin 5/13>>>  Interim history/subjective:  Feels a little more short of breath than usual of note she has had baseline dyspnea for several weeks  Objective   Blood pressure (Abnormal) 132/59, pulse (Abnormal) 162, temperature 98.3 F (36.8 C), resp. rate (Abnormal) 34, height  5\' 2"  (1.575 m), weight 61.1 kg, last menstrual period 10/30/2010, SpO2 95 %.        Intake/Output Summary (Last 24 hours) at 06/12/2018 0719 Last data filed at 06/11/2018 2002 Gross per 24 hour  Intake 2076 ml  Output no documentation  Net 2076 ml   Filed Weights   06/11/18 1247 06/11/18 1515 06/12/18 0500  Weight: 59.9 kg 60.4 kg 61.1 kg    Examination: General: This is a pleasant 58 year old white female she is currently lying in bed.  She does exhibit resting dyspnea only able to speak in 2-3 word phrases HEENT normocephalic atraumatic mucous membranes are moist there is no jugular venous distention Pulmonary: Mild tachypnea, bibasilar rales, accessory muscle use appreciated Cardiac: Tachycardic SVT on telemetry no murmur appreciated Abdomen: Soft, not tender no organomegaly Extremities: Warm and dry brisk capillary refill strong pulses no clubbing no edema Neuro: Awake oriented no focal deficits GU voids spontaneously  Resolved Hospital Problem list   Glycemia, nonketotic state  Assessment & Plan:   Sirs/sepsis with septic shock, with hypotension likely further complicated initially by volume depletion from anemia as well as later hemodynamic consequences calcium channel blockers; but also need to consider systolic dysfxn from chemo -White cell count improved  -Fever curve improved  -Procalcitonin slightly elevated; not sure what to make of this plan Continue maintenance IV fluids, ensure euvolemia Titrate Neo-Synephrine for mean arterial pressure greater than 65 Today as antibiotic day #2 azith and rocephin Trend CBC and watch for evidence of bleeding Holding home beta-blockade  Atrial fibrillation with rapid ventricular response Plan Continue telemetry monitoring Continuing amiodarone for now, repeating bolus currently Ensure electrolytes normalized Echocardiogram  Acute on chronic hypoxic respiratory failure  with bibasilar pneumonia, suspect aspiration event as  things seem to rapidly decline after vomiting she also has a history of reflux Plan Repeat chest x-ray today Continue supplemental oxygen and pulse oximetry Pulmonary hygiene measures Treat RVR, this could be contributing to dyspnea as well Widening antibiotics as mentioned above  Pancytopenia: Presume this is secondary to bone marrow suppression from chemotherapy -Received 3 units of PRBCs, hemoglobin settling at 7.8 from 6.2 -No evidence of bleeding Plan Hemoccult stool Transfuse platelets only if actively bleeding Transfuse PRBC for hemoglobin less than 7 or active bleeding We will repeat CBC later today, if hemoglobin dropping again significantly will DC a low molecular weight heparin  History of diabetes Plan Sliding scale insulin   Metastatic breast cancer.  No active treatment at this time. Plan Follow-up with heme-onc  Best practice:  Diet: NPO for now Pain/Anxiety/Delirium protocol (if indicated): prn only. VAP protocol (if indicated): N/A DVT prophylaxis: Subcu heparin  GI prophylaxis: N/A Glucose control: Phase 1  Mobility: bedrest/ Code Status: Full. Family Communication: communicated with patient only. Disposition: Still has a significant work of breathing.  Difficult to tell if this is from her SVT.  She is 24 hours into this, pro calcitonin is actually elevated and this could be a peak however I would expect things to be improving, this in addition to ongoing SVT.  Check echocardiogram given ongoing SVT she is currently receiving her second bolus of amiodarone will continue amiodarone however I suspect this will be a short-term requirement, will need to watch her CBC closely, if it continues to trend down we will need to discontinue subcutaneous heparin, however if it stabilized and there is no evidence of bleeding I think we should change her back to low molecular weight heparin given her cancer history    Critical care time: 35 minutes    Erick Colace  ACNP-BC [place] Pager # [phone] OR # [phone] if no answer    Critical care attending attestation note:  Patient seen and examined and relevant ancillary tests reviewed.  I agree with the assessment and plan of care as outlined by Jerrye Bushy, NP. This patient was not seen as a shared visit. The following reflects my independent critical care time.   Synopsis of assessment and plan:  58 year old woman with metastatic breast cancer.  Remains critically ill due to hypotension and tachycardiac requiring titration of vasopressors and administration of antiarrhythmics. Tachycardiac did not resolve with adequate correction of anemia.  On examination: JVP is elevated and apex is diffuse. No gallops or murmurs appreciated.  EKG (personally reviewed) shows regular SVT. I performed a critical care echocardiogram which shows RV and LV dilatation with reduced function and possible RV pressure/volume overload. Moderate MR/TR. IVC dilated. TDS.    Suspect acute decompensated heart failure due to Herceptin.  Trial of diuresis to improve dyspnea. Amiodarone to control SVT. Continue to titrate phenylephrine to keep MAP>60.   Formal Echocardiogram. Initiate GDHFT as tolerated.  CRITICAL CARE Performed by: Kipp Brood  Total critical care time: 35 minutes  Critical care time was exclusive of separately billable procedures and treating other patients.  Critical care was necessary to treat or prevent imminent or life-threatening deterioration.  Critical care was time spent personally by me on the following activities: development of treatment plan with patient and/or surrogate as well as nursing, discussions with consultants, evaluation of patient's response to treatment, examination of patient, obtaining history from patient or surrogate, ordering and performing treatments and  interventions, ordering and review of laboratory studies, ordering and review of radiographic  studies, pulse oximetry, re-evaluation of patient's condition and participation in multidisciplinary rounds.  Kipp Brood, MD [facility] ICU Physician [place]  Pager: [phone] Mobile: [phone] After hours: [phone].  06/12/2018, 9:31 AM

## 2018-06-12 NOTE — Progress Notes (Signed)
Initial Nutrition Assessment  RD working remotely.   DOCUMENTATION CODES:   (unable to assess for malnutrition)  INTERVENTION:  - will order Ensure Enlive BID, each supplement provides 350 kcal and 20 grams of protein. - will order daily multivitamin with minerals. - continue to encourage PO intakes.    NUTRITION DIAGNOSIS:   Increased nutrient needs related to chronic illness, cancer and cancer related treatments as evidenced by estimated needs.  GOAL:   Patient will meet greater than or equal to 90% of their needs  MONITOR:   PO intake, Supplement acceptance, Labs, Weight trends  REASON FOR ASSESSMENT:   Diagnosis  ASSESSMENT:   58 year old woman undergoing active chemotherapy for metastatic breast cancer who presented with hypotension and severe anemia.  Able to communicate with patient over the phone via RN; RD asked question to RN and RN relayed the questions to patient. Patient reports decreased appetite for a prolonged period, unsure of time frame. This RD had followed patient during admission ~1 month ago and during that time she had decreased appetite and intakes and had reported decreased appetite began even prior to that admission. Patient reports taste changes but is unable to describe the change other than "different." Did not ask any further questions this AM with respect for nurse's time. Will ask further questions at follow-up, if feasible.   RN reports that patient did not want any breakfast this AM. Juice was offered and patient declined. Patient was drinking Ensure Enlive 1-2 times/day during previous hospitalization, RN unsure if patient will accept this supplement at this time.   Per chart review, current weight is 135 lb and weight on 4/14 was 142 lb. This indicates 7 lb weight loss (5% body weight) in the past 1 month; significant for time frame although did not high rate IV fluid. Patient likely meets criteria for some degree of malnutrition but unable to  confirm at this time.   Medications reviewed; 100 mg colace BID, 40 mg IV lasix x1 dose 5/14, sliding scale novolog, 10 units novolog x1 dose 5/13, 8 units novolog TID, 10 units levemir/day, 10 mEq IV KCl x2 runs 5/14, 40 mEq K-Dur x1 dose 5/14. Labs reviewed; CBG: 144 mg/dl today, BUN: 41 mg/dl, Ca: 7.8 mg/dl. IVF; NS @ 100 ml/hr.        NUTRITION - FOCUSED PHYSICAL EXAM:  unable to perform at this time.   Diet Order:   Diet Order            Diet regular Room service appropriate? Yes; Fluid consistency: Thin  Diet effective now              EDUCATION NEEDS:   Not appropriate for education at this time  Skin:  Skin Assessment: Reviewed RN Assessment  Last BM:  5/11  Height:   Ht Readings from Last 1 Encounters:  06/11/18 5\' 2"  (1.575 m)    Weight:   Wt Readings from Last 1 Encounters:  06/12/18 61.1 kg    Ideal Body Weight:  50 kg  BMI:  Body mass index is 24.64 kg/m.  Estimated Nutritional Needs:   Kcal:  2020-2200 kcal  Protein:  100-110 grams  Fluid:  >/= 2 L/day      Jarome Matin, MS, RD, LDN, [facility] Inpatient Clinical Dietitian Pager # [phone] After hours/weekend pager # [phone]

## 2018-06-12 NOTE — Progress Notes (Signed)
NP Marni Griffon notified of patient's heartrate sustaining in the 150s despite amiodarone, with respiratory rate varying between 40-60. Patient denies feeling short of breath. Temperature 99.5. Patient refusing ECHO and speech evaluation, asking to be "left alone". Will continue to monitor closely.

## 2018-06-12 NOTE — Progress Notes (Signed)
HR remains elevated but now NSR Febrile CXR worse w/ worsening airspace disease +/- effusion on right Plan Cont abx Repeat lasix at 4p Cont oxygen Add flutter  Hope we can avoid intubation  Erick Colace ACNP-BC [place] Pager # [phone] OR # [phone] if no answer

## 2018-06-12 NOTE — TOC Initial Note (Addendum)
Transition of Care (TOC) - Initial/Assessment Note  30 Day Unplanned Readmission Risk Score     ED to Hosp-Admission (Current) from 06/11/2018 in [facility]-ICU/STEPDOWN  30 Day Unplanned Readmission Risk Score (%)  60 Filed at 06/13/2018 0801     This score is the patient's risk of an unplanned readmission within 30 days of being discharged (0 -100%). The score is based on dignosis, age, lab data, medications, orders, and past utilization.   Low:  0-14.9   Medium: 15-21.9   High: 22-29.9   Extreme: 30 and above        Readmission Risk Prevention Plan 06/13/2018 04/25/2018 04/25/2018  Transportation Screening Complete - Complete  Medication Review Press photographer) Complete - Complete  PCP or Specialist appointment within 3-5 days of discharge Not Complete (No Data) Complete  PCP/Specialist Appt Not Complete comments not close to discharge - -  [place] or Home Care Consult Complete - Complete  SW Recovery Care/Counseling Consult Not Complete - Complete  SW Consult Not Complete Comments no evidence of need at this time - -  Palliative Care Screening Not Applicable - Not [place] Not Applicable - Not Applicable  Some recent data might be hidden      Patient Details  Name: Stacy Shelton MRN: [id number] Date of Birth: 30-Mar-1960  Transition of Care (TOC) CM/SW Contact:    Joaquin Courts, RN Phone Number: 06/12/2018, 11:16 AM  Clinical Narrative:       Patient active with [facility]), rep notified that patient is admitted to hospital. If d/c home, will need home health orders at discharge.               Expected Discharge Plan: [place] Barriers to Discharge: Continued Medical Work up   Patient Goals and CMS Choice Patient states their goals for this hospitalization and ongoing recovery are:: to get better      Expected Discharge Plan and Services Expected Discharge Plan: [place]       Living arrangements for the past 2 months: Single Family Home Expected Discharge Date: (unknown)                                    Prior Living Arrangements/Services Living arrangements for the past 2 months: Single Family Home Lives with:: Relatives Patient language and need for interpreter reviewed:: Yes Do you feel safe going back to the place where you live?: Yes      Need for Family Participation in Patient Care: Yes (Comment) Care giver support system in place?: Yes (comment) Current home services: Home OT, Home PT, Home RN Criminal Activity/Legal Involvement Pertinent to Current Situation/Hospitalization: No - Comment as needed  Activities of Daily Living Home Assistive Devices/Equipment: CBG Meter, Eyeglasses, Walker (specify type)(front wheeled walker) ADL Screening (condition at time of admission) Patient's cognitive ability adequate to safely complete daily activities?: Yes Is the patient deaf or have difficulty hearing?: No Does the patient have difficulty seeing, even when wearing glasses/contacts?: No Does the patient have difficulty concentrating, remembering, or making decisions?: No Patient able to express need for assistance with ADLs?: Yes Does the patient have difficulty dressing or bathing?: Yes Independently performs ADLs?: No Communication: Independent Dressing (OT): Needs assistance Is this a change from baseline?: Change from baseline, expected to last >3 days Grooming: Needs assistance Is this a change from baseline?:  Change from baseline, expected to last >3 days Feeding: Independent Is this a change from baseline?: Pre-admission baseline Bathing: Needs assistance Is this a change from baseline?: Change from baseline, expected to last >3 days Toileting: Needs assistance Is this a change from baseline?: Change from baseline, expected to last >3days In/Out Bed: Needs assistance Is this a change from baseline?: Change from baseline, expected to  last >3 days Walks in Home: Needs assistance Is this a change from baseline?: Change from baseline, expected to last >3 days Does the patient have difficulty walking or climbing stairs?: Yes(secondary to shortness of breath) Weakness of Legs: Right(R>L) Weakness of Arms/Hands: Both  Permission Sought/Granted                  Emotional Assessment Appearance:: Appears stated age       Alcohol / Substance Use: Alcohol Use Psych Involvement: No (comment)  Admission diagnosis:  Dehydration [E86.0] Thrombocytopenia (HCC) [D69.6] Hyperglycemia [R73.9] AKI (acute kidney injury) ([place]) [N17.9] 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 Acute renal failure (ARF) ([place]) 06/11/2018  . Severe anemia 06/11/2018  . Other constipation 05/20/2018  . Metastasis to lung ([place]) 05/20/2018  . Dysuria 05/15/2018  . Cancer associated pain 05/15/2018  . Physical debility 05/13/2018  . S/P thoracentesis   . SVT (supraventricular tachycardia) ([place])   . Increased oxygen demand   . Tachypnea   . Hypoxia   . Chronic diastolic CHF (congestive heart failure) ([place]) 04/24/2018  . Anemia associated with chemotherapy 04/24/2018  . Chemotherapy-induced thrombocytopenia 04/24/2018  . Acute cystitis without hematuria   . Pneumonia due to infectious organism   . Malnutrition of moderate degree 04/09/2018  . Septic shock ([place])   . Sepsis ([place]) 04/07/2018  . Hypokalemia 04/07/2018  . Hypomagnesemia 04/07/2018  . Acute lower UTI 04/07/2018  . Hypophosphatemia 04/07/2018  . Diarrhea 03/21/2018  . Metastatic breast cancer ([place]) 03/17/2018  . Metastasis to brain ([place]) 03/17/2018  . 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 Metastasis to bone ([place]) 03/17/2018  . Pancytopenia, acquired ([place]) 03/17/2018  . Goals of care, counseling/discussion 03/17/2018  . Encounter for antineoplastic chemotherapy  03/17/2018  . Thrombocytopenia ([place]) 03/11/2018  . Uncontrolled diabetes mellitus with hyperglycemia ([place]) 03/11/2018  . Moderate persistent asthma 03/11/2018   PCP:  Robyne Peers, MD Pharmacy:   [id number] [place], [address] [place] [address] Phone: [phone] Fax: [phone]     Social Determinants of Health (SDOH) Interventions    Readmission Risk Interventions Readmission Risk Prevention Plan 04/25/2018 04/25/2018  Transportation Screening - Complete  Medication Review Press photographer) - Complete  PCP or Specialist appointment within 3-5 days of discharge (No Data) Complete  [place] or [place] - Complete  SW Recovery Care/Counseling Consult - Complete  [place] - Not Applicable  Some recent data might be hidden

## 2018-06-12 NOTE — Progress Notes (Signed)
150 mg Amiodarone bolus completed, EKG completed and placed on chart. Heartrate continues in the 140s, ST. Amiodarone drip started per Dr. Lynetta Mare. Will continue to monitor.

## 2018-06-12 NOTE — CV Procedure (Signed)
Echocardiogram attempted. Patient declined echocardiogram she said " not today please, I don't feel good, why do we keep doing this". I explained to her that I understand and I won't perform her exam.   Darlina Sicilian RDCS

## 2018-06-12 NOTE — Progress Notes (Signed)
eLink Physician-Brief Progress Note Patient Name: Stacy Shelton DOB: Oct 30, 1960 MRN: [id number]   Date of Service  06/12/2018  HPI/Events of Note  Cough  eICU Interventions  Robitussin ordered prn cough        Frederik Pear 06/12/2018, 6:37 AM

## 2018-06-12 NOTE — Progress Notes (Signed)
Stacy Shelton   DOB:08/25/1960   YS#:[id number]    ASSESSMENT & PLAN:  Metastatic breast cancer She has received 2 cycles of chemotherapy so far with improved disease control I am not concerned about mild changes noted on her CT chest in regards to the lymph nodes and lung nodule Continue aggressive supportive care  SVT, hypotensive I have discussed this with pulmonary and critical care service I have ordered echocardiogram for evaluation  Acute renal failure, resolved This was secondary to recent nausea, vomiting and dehydration.  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  Hopefully, she can be moved out of ICU once her cardiovascular instability resolves  Discharge planning She will likely be here over the weekend. I have discussed the case with primary service and updated her sister, [name] I will continue to follow  All questions were answered. The patient knows to call the clinic with any problems, questions or concerns.   [name], MD 06/12/2018 8:12 AM  Subjective:  The patient was sleeping this  morning but alert when I talk to her.  She felt better since admission.  She has no further nausea.  She received blood transfusion, IV fluids as well as IV antibiotics since admission.  Due to SVT, she is currently on amiodarone along with pressor support.  Her blood pressure is within normal limits right now.  She has converted to sinus rhythm.  She is currently under ICU care The patient denies any recent signs or symptoms of bleeding such as spontaneous epistaxis, hematuria or hematochezia. She underwent CT angiogram which ruled out pulmonary embolus.  Liver metastasis is noted to be smaller.  Objective:  Vitals:   06/12/18 0540 06/12/18 0620  BP: (!) 113/45 (!) 132/59  Pulse: (!) 151 (!) 162  Resp: (!) 46 (!) 34  Temp:    SpO2: 96% 95%     Intake/Output Summary (Last 24 hours) at 06/12/2018 0812 Last data filed at 06/11/2018 2002 Gross per 24 hour  Intake 2076 ml  Output -  Net 2076 ml    GENERAL:alert, no distress and comfortable.  She appears to be tachypneic but in no respiratory distress SKIN: Her skin color looks improved with blood transfusion EYES: normal, Conjunctiva are pale and non-injected, sclera clear OROPHARYNX:no exudate, no erythema and lips, buccal mucosa, and tongue normal  NECK: supple, thyroid normal size, non-tender, without nodularity LYMPH:  no palpable lymphadenopathy in the cervical, axillary or inguinal LUNGS: clear to auscultation anteriorly with increased breathing effort HEART: tachycardia, no murmurs and no lower extremity edema ABDOMEN:abdomen soft, non-tender and normal bowel sounds Musculoskeletal:no cyanosis of digits and no clubbing  NEURO: alert & oriented x 3 with fluent speech, no focal motor/sensory  deficits   Labs:  Recent Labs    05/06/18 0430  06/04/18 0908 06/11/18 0905 06/11/18 2030 06/11/18 2353 06/12/18 0227  NA 138   < > 141 131*  --   --  137  K 3.6   < > 4.5 5.1  --  3.4* 3.7  CL 99   < > 101 94*  --   --  105  CO2 32    < > 28 23  --   --  23  GLUCOSE 138*   < > 85 514*  --   --  85  BUN 14   < > 24* 44*  --   --  41*  CREATININE 0.54   < > 0.74 1.63* 1.04*  --  0.91  CALCIUM 7.8*   < > 9.6 9.2 8.2*  --  7.8*  GFRNONAA >60   < > >60 34* 59*  --  >60  GFRAA >60   < > >60 40* >60  --  >60  PROT 5.4*   < > 7.7 7.0  --   --  5.6*  ALBUMIN 1.7*   < > 3.1* 2.1*  --   --  1.9*  AST 23   < > 21 11*  --   --  14*  ALT 14   < > 19 13  --   --  12  ALKPHOS 372*   < > 121 137*  --   --  98  BILITOT 2.0*   < > 0.6 1.3*  --   --  1.6*  BILIDIR 1.0*  --   --   --   --   --   --   IBILI 1.0*  --   --   --   --   --   --    < > = values in this interval not displayed.    Studies: I have reviewed CT angiogram myself Ct Angio Chest Pe W Or Wo Contrast  Result Date: 06/11/2018 CLINICAL DATA:  Shortness of breath. Metastatic breast cancer. EXAM: CT ANGIOGRAPHY CHEST WITH CONTRAST TECHNIQUE: Multidetector CT imaging of the chest was performed using the standard protocol during bolus administration of intravenous contrast. Multiplanar CT image reconstructions and MIPs were obtained to evaluate the vascular anatomy. CONTRAST:  29mL OMNIPAQUE IOHEXOL 350 MG/ML SOLN COMPARISON:  Chest radiograph 06/11/2018. Thoracic spine CT 05/19/2018. Chest CTA 04/27/2018. FINDINGS: Cardiovascular: Pulmonary arterial opacification is adequate without evidence of lobar more proximal emboli. No segmental emboli are identified although assessment is limited in some areas due to motion artifact. Mild thoracic aortic atherosclerosis is noted without aneurysm. The heart is enlarged. There is no pericardial effusion. A right jugular Port-A-Cath terminates in the high right atrium. Mediastinum/Nodes: Calcified subcarinal and right hilar lymph nodes are again noted. Mildly enlarged noncalcified lymph nodes including a 10 mm short axis right paratracheal lymph node, levin mm precarinal lymph node, an 11 mm right hilar lymph node are unchanged from the prior  chest CTA. The esophagus and thyroid are unremarkable. Lungs/Pleura: There are small bilateral pleural effusions, unchanged on the right and decreased in size on the left compared to the prior chest CTA. Extensive right lower lobe consolidation has greatly progressed from the prior chest CTA with progression also noted compared to the more recent thoracic spine CT. There is mildly improved aeration of the left lower lobe compared to the prior chest CTA, however moderately extensive consolidation and volume loss remain. A 2.5 x 2.2 cm right  upper lobe nodule is unchanged from the thoracic spine CT though has enlarged from the chest CTA (series 10, image 51), as has an 8 mm nodule more anterolaterally in the right upper lobe (series 10, image 50). Multiple subcentimeter nodules are present in the left upper lobe with some being larger compared to the prior chest CT and others being unchanged. Upper Abdomen: Liver lesions have decreased in size, for example a segment VII/VIII lesion now measures 16 mm (series 4, image 85, previously 23 mm). Calcified granulomas are noted in the spleen. Musculoskeletal: Unchanged subcentimeter sclerotic foci in the T11 and T12 vertebral bodies, left glenoid, and inferior right scapular body. Review of the MIP images confirms the above findings. IMPRESSION: 1. No evidence of pulmonary emboli. 2. Extensive bilateral lower lobe consolidation. 3. Small pleural effusions. 4. 2.5 cm right upper lobe nodule, enlarged from 04/27/2018 and which may reflect a metastasis or primary lung cancer. Stable to mild enlargement of subcentimeter nodules in both upper lobes consistent with known metastases. 5. Decreased size of liver metastases. 6. Unchanged mild mediastinal and right hilar lymphadenopathy. 7. Aortic Atherosclerosis (ICD10-I70.0). Electronically Signed   By: Logan Bores M.D.   On: 06/11/2018 17:02   Ct Thoracic Spine Wo Contrast  Result Date: 05/19/2018 CLINICAL DATA:  Fall last week.  Back pain and bilateral legs since fall. EXAM: CT THORACIC SPINE WITHOUT CONTRAST TECHNIQUE: Multidetector CT images of the thoracic were obtained using the standard protocol without intravenous contrast. COMPARISON:  CT chest 04/27/2018 FINDINGS: Alignment: Normal Vertebrae: Negative for fracture or metastatic disease. No lytic or sclerotic bone lesion identified. Paraspinal and other soft tissues: Extensive left lower lobe consolidation as noted previously. Progression of right lower lobe consolidation. Right upper lobe mass lesion approximately 2.4 cm is unchanged and may represent neoplasm. Calcified subcarinal lymph node unchanged. Disc levels: Disc spaces maintained without significant degenerative change or spinal stenosis. IMPRESSION: 1. Negative for fracture or metastatic disease thoracic spine 2. Bibasilar consolidation with progression on the right. Possible pneumonia or tumor. 2.4 cm right upper lobe spiculated mass may represent metastatic disease or bronchogenic carcinoma. Electronically Signed   By: Franchot Gallo M.D.   On: 05/19/2018 17:16   Ct Lumbar Spine Wo Contrast  Result Date: 05/19/2018 CLINICAL DATA:  Fall 1 week ago. Back pain. History of metastatic breast cancer. EXAM: CT LUMBAR SPINE WITHOUT CONTRAST TECHNIQUE: Multidetector CT imaging of the lumbar spine was performed without intravenous contrast administration. Multiplanar CT image reconstructions were also generated. COMPARISON:  CT abdomen pelvis 04/08/2018 FINDINGS: Segmentation: Normal Alignment: Normal Vertebrae: Negative for lumbar fracture. 3 mm sclerotic lesion L3 vertebral body. This has enlarged slightly from 03/11/2018 therefore is suspicious for metastatic disease. No other sclerotic or lytic lesions. Paraspinal and other soft tissues: Negative for paraspinous mass or adenopathy. Mild atherosclerotic aorta. Disc levels: Mild lumbar disc and facet degeneration. Negative for disc protrusion or stenosis. IMPRESSION: 1.  Negative for lumbar fracture 2. 3 mm sclerotic lesion L4 vertebral body likely due to metastatic disease. This has enlarged since 03/21/2018. Electronically Signed   By: Franchot Gallo M.D.   On: 05/19/2018 17:23   Ct Abdomen Pelvis W Contrast  Result Date: 05/19/2018 CLINICAL DATA:  Blunt abdominal trauma. EXAM: CT ABDOMEN AND PELVIS WITH CONTRAST TECHNIQUE: Multidetector CT imaging of the abdomen and pelvis was performed using the standard protocol following bolus administration of intravenous contrast. CONTRAST:  136mL OMNIPAQUE IOHEXOL 300 MG/ML  SOLN COMPARISON:  CT scan of April 08, 2018. FINDINGS: Lower chest:  Bilateral posterior basilar atelectasis or pneumonia is noted. Hepatobiliary: No gallstones or biliary dilatation is noted. Intrahepatic metastatic lesions noted on prior exam are significantly smaller. 1.9 cm lesion is seen in peripheral portion of right hepatic lobe which is decreased compared to prior exam where it measured 2.9 cm. 1.5 cm left hepatic lesion is noted which is significantly smaller compared to prior exam, where it measures 3.7 cm. Pancreas: Unremarkable. No pancreatic ductal dilatation or surrounding inflammatory changes. Spleen: Calcified splenic granulomata are noted. No other significant abnormality is noted. Adrenals/Urinary Tract: Adrenal glands are unremarkable. Kidneys are normal, without renal calculi, focal lesion, or hydronephrosis. Bladder is unremarkable. Stomach/Bowel: The stomach appears normal. There is no evidence of bowel obstruction or inflammation. The appendix is not clearly visualized. Vascular/Lymphatic: Aortic atherosclerosis. No enlarged abdominal or pelvic lymph nodes. Reproductive: Uterus and bilateral adnexa are unremarkable. Other: There is noted interval enlargement of the right iliacus muscle which is concerning for underlying hematoma. No hernia is noted. Musculoskeletal: No acute or significant osseous findings. IMPRESSION: Interval development of  enlargement of right iliacus muscle with surrounding soft tissue stranding concerning for underlying hematoma or possibly severe muscular injury. Hepatic metastatic lesions noted on prior exam is significantly smaller currently. Bilateral posterior basilar atelectasis or pneumonia is noted. Aortic Atherosclerosis (ICD10-I70.0). Electronically Signed   By: Marijo Conception M.D.   On: 05/19/2018 17:17   Dg Pelvis Portable  Result Date: 05/19/2018 CLINICAL DATA:  58 year old female with a fall a few years ago with bilateral hip pain EXAM: PORTABLE PELVIS 1-2 VIEWS COMPARISON:  None. FINDINGS: Bony pelvic ring intact, with no acute displaced fracture. Bilateral hips projects normally over the acetabula. Mild degenerative changes bilateral hips. Unremarkable proximal femurs. IMPRESSION: Negative for acute bony abnormality. Early bilateral hip osteoarthritis Electronically Signed   By: Corrie Mckusick D.O.   On: 05/19/2018 12:34   Dg Chest Port 1 View  Result Date: 06/11/2018 CLINICAL DATA:  Hypotension. EXAM: PORTABLE CHEST 1 VIEW COMPARISON:  Radiograph of May 19, 2018. FINDINGS: Stable cardiomediastinal silhouette. Right internal jugular Port-A-Cath is unchanged in position. No pneumothorax is noted. Increased bibasilar opacities are noted concerning for pneumonia or edema. Small pleural effusions may be present. Bony thorax is unremarkable. IMPRESSION: Increased bibasilar opacities are noted concerning for worsening pneumonia or edema. Small pleural effusions may be present. Electronically Signed   By: Marijo Conception M.D.   On: 06/11/2018 11:43   Dg Chest Port 1 View  Result Date: 05/19/2018 CLINICAL DATA:  Fall last Tuesday. Pain in both hips and legs. Fever. EXAM: PORTABLE CHEST 1 VIEW COMPARISON:  May 05, 2018 FINDINGS: The right Port-A-Cath terminates in the central SVC. There is mild opacity in left base, significantly improved since May 05, 2018. Most of the right-sided infiltrate is also resolved.  Minimal opacity remains in the medial right upper lobe. No other interval changes. IMPRESSION: Mild opacities remain in the medial right upper lobe in the left base, significantly improved in the interval. The remainder of infiltrates previously seen have resolved. Recommend follow-up to complete resolution. Electronically Signed   By: Dorise Bullion III M.D   On: 05/19/2018 12:33

## 2018-06-12 NOTE — Evaluation (Signed)
SLP Cancellation Note  Patient Details Name: AKSHITA ITALIANO MRN: [id number] DOB: 1960/09/06   Cancelled treatment:       Reason Eval/Treat Not Completed: Other (comment)(RR continues to be high 40s to 50s, HR 150s, will continue efforts)   Macario Golds 06/12/2018, 4:42 PM   Luanna Salk, [facility] SLP Acute Rehab Services Pager [phone] Office [phone]

## 2018-06-12 NOTE — Progress Notes (Signed)
PCCM was consulting on pt and treated SVT earlier this shift. Now, pt was placed on pressors by PCCM. NP called Elink and pt was changed to their service given ICU status. KJKG, NP [facility]

## 2018-06-12 NOTE — Progress Notes (Addendum)
eLink Physician-Brief Progress Note Patient Name: Stacy Shelton DOB: August 08, 1960 MRN: [id number]   Date of Service  06/12/2018  HPI/Events of Note  Hypotension. K+ 3.4  eICU Interventions  Phenylephrine infusion targeting MAP > 65 mmHg, Elink electrolyte replacement protocol for K+ of 3.4 ordered.        Kerry Kass Azalya Galyon 06/12/2018, 1:37 AM

## 2018-06-12 NOTE — Progress Notes (Signed)
eLink Physician-Brief Progress Note Patient Name: Stacy Shelton DOB: [birth year] MRN: [id number]   Date of Service  06/12/2018  HPI/Events of Note  Afib with RVR  eICU Interventions  Cardizem bolus now         [employer] 06/12/2018, 6:09 AM

## 2018-06-12 NOTE — Evaluation (Signed)
SLP Cancellation Note  Patient Details Name: Stacy Shelton MRN: [id number] DOB: [birth year]   Cancelled treatment:       Reason Eval/Treat Not Completed: Other (comment);Medical issues which prohibited therapy(pt RR up to 55-60, HR in 150s, will cotninue efforts)   Macario Golds 06/12/2018, 11:38 AM   Luanna Salk, MS [facility] SLP Acute Rehab Services Pager [phone] Office [phone]

## 2018-06-12 NOTE — Progress Notes (Signed)
PT Cancellation Note  Patient Details Name: Stacy Shelton MRN: [id number] DOB: July 11, 1960   Cancelled Treatment:    Reason Eval/Treat Not Completed: Medical issues which prohibited therapy Elevated HR, receiving meds currently (see previous progress notes).   Malay Fantroy,KATHrine E 06/12/2018, 8:40 AM Carmelia Bake, PT, DPT Acute Rehabilitation Services Office: [phone] Pager: [phone]

## 2018-06-12 NOTE — Progress Notes (Signed)
Patient was seen She has refused on ECHO, SLP assessment and food Clinically, she looks depressed "I want to be left alone". She denies pain I reviewed this with her sister I will return tomorrow, will consider Psychiatry evaluation for severe depression if still declining treatment She will continue on Remeron for now

## 2018-06-12 NOTE — Progress Notes (Signed)
Verbal order for 150 mg bolus of amiodarone once per NP Marni Griffon. Bolus started at 0750.

## 2018-06-12 NOTE — Progress Notes (Signed)
eLink Physician-Brief Progress Note Patient Name: Stacy Shelton DOB: 1961/01/24 MRN: [id number]   Date of Service  06/12/2018  HPI/Events of Note  Persistent hemodynamically significant afib with RVR  eICU Interventions  Amiodarone infusion ordered        Frederik Pear 06/12/2018, 7:02 AM

## 2018-06-13 ENCOUNTER — Telehealth: Payer: Self-pay | Admitting: Hematology and Oncology

## 2018-06-13 ENCOUNTER — Other Ambulatory Visit (HOSPITAL_COMMUNITY): Payer: BLUE CROSS/BLUE SHIELD

## 2018-06-13 DIAGNOSIS — Z6823 Body mass index (BMI) 23.0-23.9, adult: Secondary | ICD-10-CM

## 2018-06-13 DIAGNOSIS — L8990 Pressure ulcer of unspecified site, unspecified stage: Secondary | ICD-10-CM

## 2018-06-13 DIAGNOSIS — C50919 Malignant neoplasm of unspecified site of unspecified female breast: Secondary | ICD-10-CM

## 2018-06-13 DIAGNOSIS — F329 Major depressive disorder, single episode, unspecified: Secondary | ICD-10-CM

## 2018-06-13 DIAGNOSIS — T451X5A Adverse effect of antineoplastic and immunosuppressive drugs, initial encounter: Secondary | ICD-10-CM

## 2018-06-13 LAB — GLUCOSE, CAPILLARY
Glucose-Capillary: 168 mg/dL — ABNORMAL HIGH (ref 70–99)
Glucose-Capillary: 186 mg/dL — ABNORMAL HIGH (ref 70–99)
Glucose-Capillary: 199 mg/dL — ABNORMAL HIGH (ref 70–99)
Glucose-Capillary: 129 mg/dL — ABNORMAL HIGH (ref 70–99)

## 2018-06-13 LAB — COMPREHENSIVE METABOLIC PANEL
ALT: 11 U/L (ref 0–44)
AST: 16 U/L (ref 15–41)
Albumin: 1.8 g/dL — ABNORMAL LOW (ref 3.5–5.0)
Alkaline Phosphatase: 96 U/L (ref 38–126)
Anion gap: 9 (ref 5–15)
BUN: 23 mg/dL — ABNORMAL HIGH (ref 6–20)
CO2: 26 mmol/L (ref 22–32)
Calcium: 7.7 mg/dL — ABNORMAL LOW (ref 8.9–10.3)
Chloride: 102 mmol/L (ref 98–111)
Creatinine, Ser: 0.71 mg/dL (ref 0.44–1.00)
GFR calc Af Amer: >60 mL/min (ref >60)
GFR calc non Af Amer: >60 mL/min (ref >60)
Glucose, Bld: 165 mg/dL — ABNORMAL HIGH (ref 70–99)
Potassium: 2.9 mmol/L — ABNORMAL LOW (ref 3.5–5.1)
Sodium: 137 mmol/L (ref 135–145)
Total Bilirubin: 1.3 mg/dL — ABNORMAL HIGH (ref 0.3–1.2)
Total Protein: 5.6 g/dL — ABNORMAL LOW (ref 6.5–8.1)

## 2018-06-13 LAB — BASIC METABOLIC PANEL
Anion gap: 12 (ref 5–15)
BUN: 21 mg/dL — ABNORMAL HIGH (ref 6–20)
CO2: 27 mmol/L (ref 22–32)
Calcium: 7.8 mg/dL — ABNORMAL LOW (ref 8.9–10.3)
Chloride: 97 mmol/L — ABNORMAL LOW (ref 98–111)
Creatinine, Ser: 0.82 mg/dL (ref 0.44–1.00)
GFR calc Af Amer: >60 mL/min (ref >60)
GFR calc non Af Amer: >60 mL/min (ref >60)
Glucose, Bld: 217 mg/dL — ABNORMAL HIGH (ref 70–99)
Potassium: 2.9 mmol/L — ABNORMAL LOW (ref 3.5–5.1)
Sodium: 136 mmol/L (ref 135–145)

## 2018-06-13 LAB — HEMOGLOBIN AND HEMATOCRIT, BLOOD
HCT: 24.4 % — ABNORMAL LOW (ref 36.0–46.0)
HCT: 26.2 % — ABNORMAL LOW (ref 36.0–46.0)
HCT: 25.1 % — ABNORMAL LOW (ref 36.0–46.0)
Hemoglobin: 8.1 g/dL — ABNORMAL LOW (ref 12.0–15.0)
Hemoglobin: 8.7 g/dL — ABNORMAL LOW (ref 12.0–15.0)
Hemoglobin: 8.3 g/dL — ABNORMAL LOW (ref 12.0–15.0)

## 2018-06-13 LAB — MAGNESIUM: Magnesium: 2.2 mg/dL (ref 1.7–2.4)

## 2018-06-13 LAB — CBC WITH DIFFERENTIAL/PLATELET
Abs Immature Granulocytes: 2.08 10*3/uL — ABNORMAL HIGH (ref 0.00–0.07)
Basophils Absolute: 0.1 10*3/uL (ref 0.0–0.1)
Basophils Relative: 1 %
Eosinophils Absolute: 0 10*3/uL (ref 0.0–0.5)
Eosinophils Relative: 0 %
HCT: 25 % — ABNORMAL LOW (ref 36.0–46.0)
Hemoglobin: 8.2 g/dL — ABNORMAL LOW (ref 12.0–15.0)
Immature Granulocytes: 22 %
Lymphocytes Relative: 12 %
Lymphs Abs: 1.1 10*3/uL (ref 0.7–4.0)
MCH: 28.4 pg (ref 26.0–34.0)
MCHC: 32.8 g/dL (ref 30.0–36.0)
MCV: 86.5 fL (ref 80.0–100.0)
Monocytes Absolute: 0.7 10*3/uL (ref 0.1–1.0)
Monocytes Relative: 7 %
Neutro Abs: 5.7 10*3/uL (ref 1.7–7.7)
Neutrophils Relative %: 58 %
Platelets: 36 10*3/uL — ABNORMAL LOW (ref 150–400)
RBC: 2.89 MIL/uL — ABNORMAL LOW (ref 3.87–5.11)
RDW: 15.9 % — ABNORMAL HIGH (ref 11.5–15.5)
WBC: 9.7 10*3/uL (ref 4.0–10.5)
nRBC: 0.9 % — ABNORMAL HIGH (ref 0.0–0.2)

## 2018-06-13 LAB — OCCULT BLOOD X 1 CARD TO LAB, STOOL: Fecal Occult Bld: NEGATIVE

## 2018-06-13 LAB — PROCALCITONIN: Procalcitonin: 18.35 ng/mL

## 2018-06-13 MED ORDER — DILTIAZEM HCL 100 MG IV SOLR
INTRAVENOUS | Status: AC
  Filled 2018-06-13: qty 100

## 2018-06-13 MED ORDER — POTASSIUM CHLORIDE 20 MEQ PO PACK
40.0000 meq | PACK | Freq: Two times a day (BID) | ORAL | Status: AC
  Administered 2018-06-13 – 2018-06-14 (×3): 40 meq via ORAL
  Filled 2018-06-13 (×3): qty 2

## 2018-06-13 MED ORDER — FUROSEMIDE 10 MG/ML IJ SOLN
60.0000 mg | Freq: Once | INTRAMUSCULAR | Status: AC
  Administered 2018-06-13: 60 mg via INTRAVENOUS
  Filled 2018-06-13: qty 6

## 2018-06-13 MED ORDER — ALBUTEROL SULFATE (2.5 MG/3ML) 0.083% IN NEBU
2.5000 mg | INHALATION_SOLUTION | RESPIRATORY_TRACT | Status: DC | PRN
  Administered 2018-06-13 – 2018-06-18 (×5): 2.5 mg via RESPIRATORY_TRACT
  Filled 2018-06-13 (×5): qty 3

## 2018-06-13 MED ORDER — MAGNESIUM SULFATE 2 GM/50ML IV SOLN
2.0000 g | Freq: Once | INTRAVENOUS | Status: AC
  Administered 2018-06-13: 2 g via INTRAVENOUS
  Filled 2018-06-13: qty 50

## 2018-06-13 MED ORDER — AMIODARONE HCL 200 MG PO TABS
200.0000 mg | ORAL_TABLET | Freq: Every day | ORAL | Status: DC
  Administered 2018-06-13 – 2018-06-22 (×10): 200 mg via ORAL
  Filled 2018-06-13 (×10): qty 1

## 2018-06-13 MED ORDER — POTASSIUM CHLORIDE 10 MEQ/50ML IV SOLN
10.0000 meq | INTRAVENOUS | Status: DC
  Administered 2018-06-13 (×4): 10 meq via INTRAVENOUS
  Filled 2018-06-13 (×4): qty 50

## 2018-06-13 MED ORDER — METOPROLOL TARTRATE 25 MG PO TABS
25.0000 mg | ORAL_TABLET | Freq: Two times a day (BID) | ORAL | Status: DC
  Administered 2018-06-13 – 2018-06-17 (×10): 25 mg via ORAL
  Filled 2018-06-13 (×11): qty 1

## 2018-06-13 MED ORDER — POTASSIUM CHLORIDE 10 MEQ/50ML IV SOLN
10.0000 meq | INTRAVENOUS | Status: AC
  Administered 2018-06-13 – 2018-06-14 (×6): 10 meq via INTRAVENOUS
  Filled 2018-06-13 (×6): qty 50

## 2018-06-13 NOTE — Progress Notes (Signed)
This RN contacted ECHO regarding attempting exam again now that patient is feeling better. They said they would try to get her on the schedule tomorrow, 5/16.

## 2018-06-13 NOTE — Progress Notes (Signed)
Stacy Shelton   DOB:27-Apr-1960   SH#:[id number]    ASSESSMENT & PLAN:   Metastatic breast cancer She has received 2 cycles of chemotherapy so far with improved disease control I am not concerned about mild changes noted on her CT chest in regards to the lymph nodes and lung nodule Continue aggressive supportive care  SVT, hypotensive I have discussed this with pulmonary and critical care service I have ordered echocardiogram for evaluation; she declined echocardiogram yesterday I explained to her the importance of completing the evaluation to guide treatment and she agreed to proceed today  Acute renal failure, resolved This was secondary to recent nausea, vomiting and dehydration.  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 I encouraged her to eat as much as she can  Severe uncontrolled hyperglycemia This has resolved with insulin drip She will resume diet and insulin as needed  Tachypnea, abnormal CT chest She has persistent bilateral atelectasis/pneumonia changes in her lung bases Cultures are pending She will continue oxygen therapy and antibiotics  Clinical depression She has clinical signs of depression, verified and confirmed by family members I have started her on Remeron that should help with stabilizing her mood I do not believe consulting psychiatrist right now is helpful especially with her difficulties with shortness of breath I will  continue to check on her and provide emotional support Once she is more stable, we will consult psychiatrist to follow  Code Status Full code  Goals of care Aggressive supportive care.  Hopefully, she can be moved out of ICU once her cardiovascular instability resolves  Discharge planning She will likely be here over the weekend. I have discussed the case with primary service and updated her sister, [name] I will continue to follow, and will return to check on her later today.  All questions were answered. The patient knows to call the clinic with any problems, questions or concerns.   [name], MD 06/13/2018 8:19 AM  Subjective:  She looks calm today.  She is ready to proceed with further evaluation as needed.  She did not eat anything yesterday due to lack of appetite.  She complained of difficulties talking due to persistent shortness of breath She denies cough.  No documented fever.  Objective:  Vitals:   06/13/18 0600 06/13/18 0700  BP: (!) 101/44 (!) 105/48  Pulse: (!) 109 (!) 109  Resp: (!) 53 (!) 50  Temp:    SpO2: 93% 92%     Intake/Output Summary (Last 24 hours) at 06/13/2018 0819 Last data filed at 06/13/2018 0700 Gross per 24 hour  Intake 1683.9 ml  Output 2795 ml  Net -1111.1 ml    GENERAL:alert, no distress and comfortable. Noted mild respiratory distress with increased work of breathing SKIN: pale EYES: normal, Conjunctiva are pale and non-injected, sclera clear Musculoskeletal:no cyanosis of digits and no clubbing  NEURO: alert & oriented x 3 with fluent speech, no focal motor/sensory deficits   Labs:  Recent Labs    05/06/18 0430  06/11/18 0905  06/11/18 2030 06/11/18 2353 06/12/18 0227 06/13/18 0146  NA 138   < > 131*  --   --  137 137  K 3.6   < > 5.1  --  3.4* 3.7 2.9*  CL 99   < > 94*  --   --  105 102  CO2 32   < > 23  --   --  23 26  GLUCOSE 138*   < > 514*  --   --  85 165*  BUN 14   < > 44*  --   --  41* 23*  CREATININE 0.54   < >  1.63* 1.04*  --  0.91 0.71  CALCIUM 7.8*   < > 9.2 8.2*  --  7.8* 7.7*  GFRNONAA >60   < > 34* 59*  --  >60 >60  GFRAA >60   < > 40* >60  --  >60 >60  PROT 5.4*   < > 7.0  --   --  5.6* 5.6*  ALBUMIN 1.7*   < > 2.1*  --   --  1.9* 1.8*  AST 23   < > 11*  --   --  14* 16  ALT 14   < > 13  --   --  12 11  ALKPHOS 372*   < > 137*  --   --  98 96  BILITOT 2.0*   < > 1.3*  --   --  1.6* 1.3*  BILIDIR 1.0*  --   --   --   --   --   --   IBILI 1.0*  --   --   --   --   --   --    < > = values in this interval not displayed.    Studies:  Ct Angio Chest Pe W Or Wo Contrast  Result Date: 06/11/2018 CLINICAL DATA:  Shortness of breath. Metastatic breast cancer. EXAM: CT ANGIOGRAPHY CHEST WITH CONTRAST TECHNIQUE: Multidetector CT imaging of the chest was performed using the standard protocol during bolus administration of intravenous contrast. Multiplanar CT image reconstructions and MIPs were obtained to evaluate the vascular anatomy. CONTRAST:  63mL OMNIPAQUE IOHEXOL 350 MG/ML SOLN COMPARISON:  Chest radiograph 06/11/2018. Thoracic spine CT 05/19/2018. Chest CTA 04/27/2018. FINDINGS: Cardiovascular: Pulmonary arterial opacification is adequate without evidence of lobar more proximal emboli. No segmental emboli are identified although assessment is limited in some areas due to motion artifact. Mild thoracic aortic atherosclerosis is noted without aneurysm. The heart is enlarged. There is no pericardial effusion. A right jugular Port-A-Cath terminates in the high right atrium. Mediastinum/Nodes: Calcified subcarinal and right hilar lymph nodes are again noted. Mildly enlarged noncalcified lymph nodes including a 10 mm short axis right paratracheal lymph node, levin mm precarinal lymph node, an 11 mm right hilar lymph node are unchanged from the prior chest CTA. The esophagus and thyroid are unremarkable. Lungs/Pleura: There are small bilateral pleural effusions, unchanged on the right and decreased in size on  the left compared to the prior chest CTA. Extensive right lower lobe consolidation has greatly progressed from the prior chest CTA with progression also noted compared to the more recent thoracic spine CT. There is mildly improved aeration of the left lower lobe compared to the prior chest CTA, however moderately extensive consolidation and volume loss remain. A 2.5 x 2.2 cm right upper lobe nodule is unchanged from the thoracic spine CT though has enlarged from the chest CTA (series 10, image  51), as has an 8 mm nodule more anterolaterally in the right upper lobe (series 10, image 50). Multiple subcentimeter nodules are present in the left upper lobe with some being larger compared to the prior chest CT and others being unchanged. Upper Abdomen: Liver lesions have decreased in size, for example a segment VII/VIII lesion now measures 16 mm (series 4, image 85, previously 23 mm). Calcified granulomas are noted in the spleen. Musculoskeletal: Unchanged subcentimeter sclerotic foci in the T11 and T12 vertebral bodies, left glenoid, and inferior right scapular body. Review of the MIP images confirms the above findings. IMPRESSION: 1. No evidence of pulmonary emboli. 2. Extensive bilateral lower lobe consolidation. 3. Small pleural effusions. 4. 2.5 cm right upper lobe nodule, enlarged from 04/27/2018 and which may reflect a metastasis or primary lung cancer. Stable to mild enlargement of subcentimeter nodules in both upper lobes consistent with known metastases. 5. Decreased size of liver metastases. 6. Unchanged mild mediastinal and right hilar lymphadenopathy. 7. Aortic Atherosclerosis (ICD10-I70.0). Electronically Signed   By: Logan Bores M.D.   On: 06/11/2018 17:02   Ct Thoracic Spine Wo Contrast  Result Date: 05/19/2018 CLINICAL DATA:  Fall last week. Back pain and bilateral legs since fall. EXAM: CT THORACIC SPINE WITHOUT CONTRAST TECHNIQUE: Multidetector CT images of the thoracic were obtained using the  standard protocol without intravenous contrast. COMPARISON:  CT chest 04/27/2018 FINDINGS: Alignment: Normal Vertebrae: Negative for fracture or metastatic disease. No lytic or sclerotic bone lesion identified. Paraspinal and other soft tissues: Extensive left lower lobe consolidation as noted previously. Progression of right lower lobe consolidation. Right upper lobe mass lesion approximately 2.4 cm is unchanged and may represent neoplasm. Calcified subcarinal lymph node unchanged. Disc levels: Disc spaces maintained without significant degenerative change or spinal stenosis. IMPRESSION: 1. Negative for fracture or metastatic disease thoracic spine 2. Bibasilar consolidation with progression on the right. Possible pneumonia or tumor. 2.4 cm right upper lobe spiculated mass may represent metastatic disease or bronchogenic carcinoma. Electronically Signed   By: Franchot Gallo M.D.   On: 05/19/2018 17:16   Ct Lumbar Spine Wo Contrast  Result Date: 05/19/2018 CLINICAL DATA:  Fall 1 week ago. Back pain. History of metastatic breast cancer. EXAM: CT LUMBAR SPINE WITHOUT CONTRAST TECHNIQUE: Multidetector CT imaging of the lumbar spine was performed without intravenous contrast administration. Multiplanar CT image reconstructions were also generated. COMPARISON:  CT abdomen pelvis 04/08/2018 FINDINGS: Segmentation: Normal Alignment: Normal Vertebrae: Negative for lumbar fracture. 3 mm sclerotic lesion L3 vertebral body. This has enlarged slightly from 03/11/2018 therefore is suspicious for metastatic disease. No other sclerotic or lytic lesions. Paraspinal and other soft tissues: Negative for paraspinous mass or adenopathy. Mild atherosclerotic aorta. Disc levels: Mild lumbar disc and facet degeneration. Negative for disc protrusion or stenosis. IMPRESSION: 1. Negative for lumbar fracture 2. 3 mm sclerotic lesion L4 vertebral body likely due to metastatic disease. This has enlarged since 03/21/2018. Electronically  Signed   By: Franchot Gallo M.D.   On: 05/19/2018 17:23   Ct Abdomen Pelvis W Contrast  Result Date: 05/19/2018 CLINICAL DATA:  Blunt abdominal trauma. EXAM: CT ABDOMEN AND PELVIS WITH CONTRAST TECHNIQUE: Multidetector CT imaging of the abdomen and pelvis was performed using the standard protocol following bolus administration of intravenous contrast. CONTRAST:  157mL OMNIPAQUE IOHEXOL 300 MG/ML  SOLN COMPARISON:  CT scan of April 08, 2018. FINDINGS: Lower chest: Bilateral posterior basilar atelectasis or pneumonia is noted. Hepatobiliary: No gallstones or biliary dilatation is noted. Intrahepatic metastatic lesions noted  on prior exam are significantly smaller. 1.9 cm lesion is seen in peripheral portion of right hepatic lobe which is decreased compared to prior exam where it measured 2.9 cm. 1.5 cm left hepatic lesion is noted which is significantly smaller compared to prior exam, where it measures 3.7 cm. Pancreas: Unremarkable. No pancreatic ductal dilatation or surrounding inflammatory changes. Spleen: Calcified splenic granulomata are noted. No other significant abnormality is noted. Adrenals/Urinary Tract: Adrenal glands are unremarkable. Kidneys are normal, without renal calculi, focal lesion, or hydronephrosis. Bladder is unremarkable. Stomach/Bowel: The stomach appears normal. There is no evidence of bowel obstruction or inflammation. The appendix is not clearly visualized. Vascular/Lymphatic: Aortic atherosclerosis. No enlarged abdominal or pelvic lymph nodes. Reproductive: Uterus and bilateral adnexa are unremarkable. Other: There is noted interval enlargement of the right iliacus muscle which is concerning for underlying hematoma. No hernia is noted. Musculoskeletal: No acute or significant osseous findings. IMPRESSION: Interval development of enlargement of right iliacus muscle with surrounding soft tissue stranding concerning for underlying hematoma or possibly severe muscular injury. Hepatic  metastatic lesions noted on prior exam is significantly smaller currently. Bilateral posterior basilar atelectasis or pneumonia is noted. Aortic Atherosclerosis (ICD10-I70.0). Electronically Signed   By: Marijo Conception M.D.   On: 05/19/2018 17:17   Dg Pelvis Portable  Result Date: 05/19/2018 CLINICAL DATA:  58 year old female with a fall a few years ago with bilateral hip pain EXAM: PORTABLE PELVIS 1-2 VIEWS COMPARISON:  None. FINDINGS: Bony pelvic ring intact, with no acute displaced fracture. Bilateral hips projects normally over the acetabula. Mild degenerative changes bilateral hips. Unremarkable proximal femurs. IMPRESSION: Negative for acute bony abnormality. Early bilateral hip osteoarthritis Electronically Signed   By: Corrie Mckusick D.O.   On: 05/19/2018 12:34   Dg Chest Port 1 View  Result Date: 06/12/2018 CLINICAL DATA:  58 year old female with possible pneumonia in cancer patient. EXAM: PORTABLE CHEST 1 VIEW COMPARISON:  06/11/2018 and earlier. FINDINGS: Portable AP semi upright view at 0943 hours. Stable right chest power port, accessed. Increasing confluence of mid and lower lung opacity. Consolidation demonstrated by CT yesterday. Trace pleural fluid more apparent by CT. No superimposed pneumothorax or pulmonary edema. Stable cardiac size and mediastinal contours. Visualized tracheal air column is within normal limits. IMPRESSION: Mild radiographic progression of bilateral consolidation/pneumonia since yesterday. Electronically Signed   By: Genevie Ann M.D.   On: 06/12/2018 10:37   Dg Chest Port 1 View  Result Date: 06/11/2018 CLINICAL DATA:  Hypotension. EXAM: PORTABLE CHEST 1 VIEW COMPARISON:  Radiograph of May 19, 2018. FINDINGS: Stable cardiomediastinal silhouette. Right internal jugular Port-A-Cath is unchanged in position. No pneumothorax is noted. Increased bibasilar opacities are noted concerning for pneumonia or edema. Small pleural effusions may be present. Bony thorax is  unremarkable. IMPRESSION: Increased bibasilar opacities are noted concerning for worsening pneumonia or edema. Small pleural effusions may be present. Electronically Signed   By: Marijo Conception M.D.   On: 06/11/2018 11:43   Dg Chest Port 1 View  Result Date: 05/19/2018 CLINICAL DATA:  Fall last Tuesday. Pain in both hips and legs. Fever. EXAM: PORTABLE CHEST 1 VIEW COMPARISON:  May 05, 2018 FINDINGS: The right Port-A-Cath terminates in the central SVC. There is mild opacity in left base, significantly improved since May 05, 2018. Most of the right-sided infiltrate is also resolved. Minimal opacity remains in the medial right upper lobe. No other interval changes. IMPRESSION: Mild opacities remain in the medial right upper lobe in the left base, significantly improved in  the interval. The remainder of infiltrates previously seen have resolved. Recommend follow-up to complete resolution. Electronically Signed   By: Dorise Bullion III M.D   On: 05/19/2018 12:33

## 2018-06-13 NOTE — Progress Notes (Signed)
NAME:  Stacy Shelton, MRN:  [id number], DOB:  [birth year], LOS: 2 ADMISSION DATE:  06/11/2018, CONSULTATION DATE:  06/11/2018 REFERRING MD:  Gilford Rile, CHIEF COMPLAINT:  Hypotension.   Brief History   58 year old woman undergoing active chemotherapy for metastatic breast cancer who presents with hypotension and severe anemia.  Last received chemotherapy from Dr Ernst Spell on 4/29 (Taxotere, Herceptin, Perjeta) and was feeling well until she developed severe nausea and vomiting 2 days ago. Associated dyspnea started afterwards.  Past Medical History  Asthma, DM, metastatic breast CA, seasonal allergies    Significant Hospital Events   Admitted from clinic 5/13 and given fluids and transfusion ordered.  Started on empiric antibiotics. 5/13->5/14: Hemoglobin is low at 6.2.  Received a total of 3 units PRBC.  Febrile overnight, max 103.1, during evening developed SVT.  Treated initially with adenosine, initially converted to sinus tachycardia.  Electrolytes corrected, fever treated, required phenylephrine infusion for hypotension, eventually added amiodarone infusion, white cell count trending down.  Hemoglobin 7.8.  Procalcitonin actually increased from day prior from 31.64-32.72.  Oxygen requirements: Remain at 3 L, this is her baseline oxygen however she does report worsening resting dyspnea.  Receiving a second bolus of amiodarone Consults:  PCCM  Procedures:  N/A  Significant Diagnostic Tests:  CXR 5/13 - mild bibasilar interstitial infiltrates and small effusions. CTA chest 5/13 - no PE.  Micro Data:  Blood cultures 5/13 - negative to date COVID 5/13 - negative   Antimicrobials:  Ceftriaxone 5/13>>>  Azithromycin 5/13>>>  Interim history/subjective:  Still dyspneic today, despite improvement in heart rate.  Objective   Blood pressure (!) 111/53, pulse (!) 109, temperature 98.8 F (37.1 C), temperature source Oral, resp. rate (!) 50, height 5\' 2"  (1.575 m), weight 61.9 kg, last  menstrual period 10/30/2010, SpO2 92 %.        Intake/Output Summary (Last 24 hours) at 06/13/2018 0909 Last data filed at 06/13/2018 0800 Gross per 24 hour  Intake 1773.15 ml  Output 2795 ml  Net -1021.85 ml   Filed Weights   06/11/18 1515 06/12/18 0500 06/13/18 0500  Weight: 60.4 kg 61.1 kg 61.9 kg    Examination: General: This is a pleasant 58 year old white female she is currently lying in bed.  She does exhibit resting dyspnea only able to speak in 2-3 word phrases HEENT normocephalic atraumatic mucous membranes are moist there is no jugular venous distention Pulmonary: Mild tachypnea, bibasilar rales, accessory muscle use appreciated Cardiac: JVP to angle of jaw. Apex diffuse. 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  Now back in sinus rhythm Initiate oral metoprolol and amiodarone.  Wean diltiazem to off.  Acute on chronic hypoxic respiratory failure with bibasilar pneumonia, versus heart failure Complete course of antibiotics. Continue diuresis. Plan to obtain formal echocardiogram once heart failure better compensated.  Hypokalemia related to diuresis Pleat potassium and magnesium.  Panytopenia: Presume this is secondary to bone marrow suppression from chemotherapy -Received 3 units of PRBCs, hemoglobin settling at 7.8 from 6.2 -No evidence of bleeding Transfuse platelets only if actively bleeding Transfuse PRBC for hemoglobin less than 7 or active bleeding We will repeat CBC later today, if hemoglobin dropping again significantly will DC a  low molecular weight heparin  History of diabetes Plan Sliding scale insulin  Metastatic breast cancer.   No active treatment at this time. Plan Follow-up with heme-onc  Best practice:  Diet: NPO for now Pain/Anxiety/Delirium protocol (if indicated): prn only. VAP protocol (if indicated): N/A DVT prophylaxis: Subcu heparin  GI prophylaxis: N/A Glucose control: Phase 1  Mobility: bedrest/ Code Status: Full. Family Communication: communicated with patient only. Disposition: Continue ICU level of care as at high risk for requiring further IV   CRITICAL CARE Performed by: Kipp Brood   Total critical care time: 35 minutes  Critical care time was exclusive of separately billable procedures and treating other patients.  Critical care was necessary to treat or prevent imminent or life-threatening deterioration.  Critical care was time spent personally by me on the following activities: development of treatment plan with patient and/or surrogate as well as nursing, discussions with consultants, evaluation of patient's response to treatment, examination of patient, obtaining history from patient or surrogate, ordering and performing treatments and interventions, ordering and review of laboratory studies, ordering and review of radiographic studies, pulse oximetry, re-evaluation of patient's condition and participation in multidisciplinary rounds.  Kipp Brood, MD [facility] ICU Physician [place]  Pager: [phone] Mobile: [phone] After hours: [phone].

## 2018-06-13 NOTE — Evaluation (Signed)
SLP Cancellation Note  Patient Details Name: Stacy Shelton MRN: [id number] DOB: [birth year]   Cancelled treatment:       Reason Eval/Treat Not Completed: Medical issues which prohibited therapy(pt's RR continues in 40s and 50s, will sign off at this time, thanks. )  Luanna Salk, [place] [facility] SLP [place] Pager [phone] Office [phone]  Stacy Shelton 06/13/2018, 12:03 PM

## 2018-06-13 NOTE — Progress Notes (Signed)
eLink Physician-Brief Progress Note Patient Name: Stacy Shelton DOB: 02-19-1960 MRN: [id number]   Date of Service  06/13/2018  HPI/Events of Note  Continued hypokalemia on standing potassium order  eICU Interventions  Potassium replaced.  Continue with scheduled potassium     Intervention Category Intermediate Interventions: Electrolyte abnormality - evaluation and management  [place] 06/13/2018, 7:31 PM

## 2018-06-13 NOTE — Evaluation (Signed)
Physical Therapy Evaluation Patient Details Name: Stacy Shelton MRN: [id number] DOB: [birth year] Today's Date: 06/13/2018   History of Present Illness  Pt admitted with hypotension, hyperglycemia, Hgb 6., acute on chronic pancytopenia and currently undergoing chemo for metastatic breast CA.  Clinical Impression  Pt admitted as above and presenting with functional mobility limitations 2* generalized weakness, balance deficits and very limited endurance.  Pt could benefit from follow up rehab at SNF level to maximize IND and safety prior to return home alone - dependent on acute stay progress.    Follow Up Recommendations SNF    Equipment Recommendations  None recommended by PT    Recommendations for Other Services OT consult     Precautions / Restrictions Precautions Precautions: Fall Restrictions Weight Bearing Restrictions: No      Mobility  Bed Mobility Overal bed mobility: Needs Assistance Bed Mobility: Supine to Sit     Supine to sit: Min assist;Mod assist;+2 for physical assistance;+2 for safety/equipment     General bed mobility comments: cues for sequence; physical assist to manage LEs, bring trunk to upright and use of pad to scoot to EOB sitting  Transfers Overall transfer level: Needs assistance Equipment used: Rolling walker (2 wheeled) Transfers: Sit to/from Stand Sit to Stand: +2 physical assistance;+2 safety/equipment;Min assist;Mod assist         General transfer comment: cues for use of UEs to self assist; physical assist to bring wt up and fwd and to balance in sitting  Ambulation/Gait Ambulation/Gait assistance: Min assist;+2 physical assistance;+2 safety/equipment Gait Distance (Feet): 3 Feet Assistive device: Rolling walker (2 wheeled) Gait Pattern/deviations: Step-to pattern;Decreased step length - right;Decreased step length - left;Shuffle Gait velocity: decr   General Gait Details: cues for posture and position from RW.  Increased time.   Physical assist to balance/support  Stairs            Wheelchair Mobility    Modified Rankin (Stroke Patients Only)       Balance Overall balance assessment: Needs assistance Sitting-balance support: Feet supported;No upper extremity supported Sitting balance-Leahy Scale: Fair     Standing balance support: Bilateral upper extremity supported Standing balance-Leahy Scale: Poor                               Pertinent Vitals/Pain Pain Assessment: 0-10 Pain Score: 4  Pain Location: ankles and feet from rubbing on sheets - floated feet with pillow at end of session Pain Descriptors / Indicators: Sore Pain Intervention(s): Limited activity within patient's tolerance;Monitored during session;Repositioned    Home Living Family/patient expects to be discharged to:: Unsure                 Additional Comments: Per RN, pt lives alone.  Had been assisted by sister but sister returned to [place]    Prior Function Level of Independence: Independent;Independent with assistive device(s)         Comments: ambulating with RW     Hand Dominance        Extremity/Trunk Assessment   Upper Extremity Assessment Upper Extremity Assessment: Generalized weakness    Lower Extremity Assessment Lower Extremity Assessment: Generalized weakness       Communication   Communication: No difficulties  Cognition Arousal/Alertness: Awake/alert Behavior During Therapy: WFL for tasks assessed/performed Overall Cognitive Status: Within Functional Limits for tasks assessed  General Comments      Exercises     Assessment/Plan    PT Assessment Patient needs continued PT services  PT Problem List Decreased strength;Decreased activity tolerance;Decreased balance;Decreased mobility;Decreased knowledge of use of DME;Pain       PT Treatment Interventions DME instruction;Gait training;Stair training;Functional  mobility training;Therapeutic activities;Therapeutic exercise;Balance training;Patient/family education    PT Goals (Current goals can be found in the Care Plan section)  Acute Rehab PT Goals Patient Stated Goal: Get up to the chair PT Goal Formulation: With patient Time For Goal Achievement: 06/27/18 Potential to Achieve Goals: Fair    Frequency Min 3X/week   Barriers to discharge Decreased caregiver support Lives alone    Co-evaluation               AM-PAC PT "6 Clicks" Mobility  Outcome Measure Help needed turning from your back to your side while in a flat bed without using bedrails?: A Little Help needed moving from lying on your back to sitting on the side of a flat bed without using bedrails?: A Lot Help needed moving to and from a bed to a chair (including a wheelchair)?: A Lot Help needed standing up from a chair using your arms (e.g., wheelchair or bedside chair)?: A Lot Help needed to walk in hospital room?: A Lot Help needed climbing 3-5 steps with a railing? : Total 6 Click Score: 12    End of Session Equipment Utilized During Treatment: Gait belt;Oxygen Activity Tolerance: Patient tolerated treatment well;Patient limited by fatigue Patient left: in chair;with call bell/phone within reach Nurse Communication: Mobility status PT Visit Diagnosis: Unsteadiness on feet (R26.81);Muscle weakness (generalized) (M62.81);Difficulty in walking, not elsewhere classified (R26.2)    Time: 1140-1155 PT Time Calculation (min) (ACUTE ONLY): 15 min   Charges:   PT Evaluation $PT Eval Low Complexity: 1 Low          [facility] Pager [phone] Office [phone]   Tynan Boesel 06/13/2018, 3:12 PM

## 2018-06-13 NOTE — Telephone Encounter (Signed)
I checked on the patient again around 330.  She was sitting on the recliner chair.  She attempted to eat some nutritional supplement.  Her heart rate is improving and overall, she felt a bit better I have updated her sister of her clinical progress. I will check on her again tomorrow.

## 2018-06-13 NOTE — Progress Notes (Signed)
eLink Physician-Brief Progress Note Patient Name: Stacy Shelton DOB: Aug 18, 1960 MRN: [id number]   Date of Service  06/13/2018  HPI/Events of Note  Hypokalemia K+ 2.9  eICU Interventions  Elink K+ replacement protocol ordered        Frederik Pear 06/13/2018, 3:44 AM

## 2018-06-14 ENCOUNTER — Inpatient Hospital Stay (HOSPITAL_COMMUNITY): Payer: BLUE CROSS/BLUE SHIELD

## 2018-06-14 DIAGNOSIS — I340 Nonrheumatic mitral (valve) insufficiency: Secondary | ICD-10-CM

## 2018-06-14 DIAGNOSIS — I5031 Acute diastolic (congestive) heart failure: Secondary | ICD-10-CM

## 2018-06-14 DIAGNOSIS — D6481 Anemia due to antineoplastic chemotherapy: Secondary | ICD-10-CM

## 2018-06-14 DIAGNOSIS — R000 Tachycardia, unspecified: Secondary | ICD-10-CM

## 2018-06-14 DIAGNOSIS — I361 Nonrheumatic tricuspid (valve) insufficiency: Secondary | ICD-10-CM

## 2018-06-14 LAB — COMPREHENSIVE METABOLIC PANEL
ALT: 16 U/L (ref 0–44)
AST: 26 U/L (ref 15–41)
Albumin: 1.9 g/dL — ABNORMAL LOW (ref 3.5–5.0)
Alkaline Phosphatase: 180 U/L — ABNORMAL HIGH (ref 38–126)
Anion gap: 10 (ref 5–15)
BUN: 21 mg/dL — ABNORMAL HIGH (ref 6–20)
CO2: 27 mmol/L (ref 22–32)
Calcium: 7.9 mg/dL — ABNORMAL LOW (ref 8.9–10.3)
Chloride: 101 mmol/L (ref 98–111)
Creatinine, Ser: 0.68 mg/dL (ref 0.44–1.00)
GFR calc Af Amer: >60 mL/min (ref >60)
GFR calc non Af Amer: >60 mL/min (ref >60)
Glucose, Bld: 85 mg/dL (ref 70–99)
Potassium: 3.9 mmol/L (ref 3.5–5.1)
Sodium: 138 mmol/L (ref 135–145)
Total Bilirubin: 1.6 mg/dL — ABNORMAL HIGH (ref 0.3–1.2)
Total Protein: 6 g/dL — ABNORMAL LOW (ref 6.5–8.1)

## 2018-06-14 LAB — URINE CULTURE: Culture: <10000 — AB

## 2018-06-14 LAB — CBC WITH DIFFERENTIAL/PLATELET
Abs Immature Granulocytes: 3.8 10*3/uL — ABNORMAL HIGH (ref 0.00–0.07)
Basophils Absolute: 0.5 10*3/uL — ABNORMAL HIGH (ref 0.0–0.1)
Basophils Relative: 3 %
Eosinophils Absolute: 0.1 10*3/uL (ref 0.0–0.5)
Eosinophils Relative: 0 %
HCT: 27.4 % — ABNORMAL LOW (ref 36.0–46.0)
Hemoglobin: 8.5 g/dL — ABNORMAL LOW (ref 12.0–15.0)
Immature Granulocytes: 26 %
Lymphocytes Relative: 11 %
Lymphs Abs: 1.6 10*3/uL (ref 0.7–4.0)
MCH: 27.8 pg (ref 26.0–34.0)
MCHC: 31 g/dL (ref 30.0–36.0)
MCV: 89.5 fL (ref 80.0–100.0)
Monocytes Absolute: 0.9 10*3/uL (ref 0.1–1.0)
Monocytes Relative: 6 %
Neutro Abs: 8.1 10*3/uL — ABNORMAL HIGH (ref 1.7–7.7)
Neutrophils Relative %: 54 %
Platelets: 36 10*3/uL — ABNORMAL LOW (ref 150–400)
RBC: 3.06 MIL/uL — ABNORMAL LOW (ref 3.87–5.11)
RDW: 16.7 % — ABNORMAL HIGH (ref 11.5–15.5)
WBC: 14.9 10*3/uL — ABNORMAL HIGH (ref 4.0–10.5)
nRBC: 1.5 % — ABNORMAL HIGH (ref 0.0–0.2)

## 2018-06-14 LAB — ECHOCARDIOGRAM LIMITED
Height: 62 in
Weight: 2137.58 oz

## 2018-06-14 LAB — GLUCOSE, CAPILLARY
Glucose-Capillary: 70 mg/dL (ref 70–99)
Glucose-Capillary: 132 mg/dL — ABNORMAL HIGH (ref 70–99)
Glucose-Capillary: 215 mg/dL — ABNORMAL HIGH (ref 70–99)
Glucose-Capillary: 103 mg/dL — ABNORMAL HIGH (ref 70–99)

## 2018-06-14 LAB — HEMOGLOBIN AND HEMATOCRIT, BLOOD
HCT: 24.6 % — ABNORMAL LOW (ref 36.0–46.0)
Hemoglobin: 7.9 g/dL — ABNORMAL LOW (ref 12.0–15.0)

## 2018-06-14 LAB — MAGNESIUM: Magnesium: 1.9 mg/dL (ref 1.7–2.4)

## 2018-06-14 MED ORDER — BENZONATATE 100 MG PO CAPS
200.0000 mg | ORAL_CAPSULE | Freq: Three times a day (TID) | ORAL | Status: DC | PRN
  Administered 2018-06-14 – 2018-06-21 (×13): 200 mg via ORAL
  Filled 2018-06-14 (×14): qty 2

## 2018-06-14 MED ORDER — FUROSEMIDE 40 MG PO TABS
40.0000 mg | ORAL_TABLET | Freq: Once | ORAL | Status: AC
  Administered 2018-06-14: 40 mg via ORAL
  Filled 2018-06-14: qty 1

## 2018-06-14 MED ORDER — GUAIFENESIN 100 MG/5ML PO SOLN
5.0000 mL | ORAL | Status: DC | PRN
  Administered 2018-06-14 – 2018-06-15 (×2): 100 mg via ORAL
  Filled 2018-06-14 (×2): qty 10

## 2018-06-14 NOTE — Progress Notes (Signed)
PT demonstrates hands on understanding of Flutter device- NPC at this time. 

## 2018-06-14 NOTE — Progress Notes (Signed)
  Echocardiogram 2D Echocardiogram has been performed.  Stacy Shelton 06/14/2018, 9:40 AM

## 2018-06-14 NOTE — Progress Notes (Signed)
Stacy Shelton   DOB:05/12/1960   RK#:[id number]    ASSESSMENT & PLAN:  Metastatic breast cancer She has received 2 cycles of chemotherapy so far with improved disease control I am not concerned about mild changes noted on her CT chest in regards to the lymph nodes and lung nodule Continue aggressive supportive care  SVT, hypotensive, resolving ECHO is pending I explained to her the importance of completing the evaluation to guide treatment and she agreed to proceed today The primary service is attempting to wean her off and transition cardiac medicine to oral route  Acute renal failure, resolved This was secondary to recent nausea, vomiting and dehydration. Observe closely for now  Chronic pancytopenia She had acute on chronic pancytopenia due to recent bone marrow suppression from chemotherapy She has received 2 units of blood transfusion on 06/11/2018 with stable blood count She does not need platelet transfusion unless signs of bleeding or platelet count less than 10,000 Continue close monitoring daily; I do not think she needs every 6 hours hemoglobin check anymore.  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 I encouraged her to eat as much as she can  Severe uncontrolled hyperglycemia, resolved This has resolved with insulin drip She will resume diet and insulin as needed  Tachypnea, abnormal CT chest She has persistent bilateral atelectasis/pneumonia changes in her lung bases Cultures are negative She will continue oxygen therapy and antibiotics, hopefully, her antibiotics can be discontinued soon  Clinical depression, stable She has clinical signs of depression, verified and confirmed by family members I have started her on Remeron that should  help with stabilizing her mood I will continue to check on her and provide emotional support Once she is more stable, we will consult psychiatrist to follow  Code Status Full code  Goals of care Aggressive supportive care. Hopefully, she can be moved out of ICU once her cardiovascular instability resolves  Discharge planning She will likely be here over the weekend. I have updated her sister, [name] I will continue to follow, and will return to check on her on Monday.  Please call if questions arise  All questions were answered. The patient knows to call the clinic with any problems, questions or concerns.   [name], MD 06/14/2018 8:01 AM  Subjective:  She feels good today except for persistent cough overnight.  She had one episode of low-grade fever that subsequently resolved.  She is off pressor support.  Most of her IV cardiac medications are converted to oral route.  She is attempting to eat but still have persistent poor appetite.  Her back pain has improved.The patient denies any recent signs or symptoms of bleeding such as spontaneous epistaxis, hematuria or hematochezia.  Objective:  Vitals:   06/14/18 0700 06/14/18 0759  BP: (!) 113/39   Pulse: (!) 107   Resp: (!) 36   Temp: 99.7 F (37.6 C)   SpO2: 97% 95%     Intake/Output Summary (Last 24 hours) at 06/14/2018 0801 Last data filed at 06/14/2018 0600 Gross per 24 hour  Intake 1595.52 ml  Output 1650 ml  Net -54.48 ml    GENERAL:alert, no distress and comfortable SKIN: pale, skin color, texture, turgor are normal, no rashes or significant lesions.  Noted persistent LUNGS: clear to auscultation and percussion  with mildly increased breathing effort HEART: tachycardia, no murmurs and no lower extremity edema ABDOMEN:abdomen soft,  Musculoskeletal:no cyanosis of digits and no clubbing  NEURO: alert & oriented x 3 with fluent speech, no focal motor/sensory deficits   Labs:  Recent Labs    05/06/18 0430   06/12/18 0227 06/13/18 0146 06/13/18 1700 06/14/18 0206  NA 138   < > 137 137 136 138  K 3.6   < > 3.7 2.9* 2.9* 3.9  CL 99   < > 105 102 97* 101  CO2 32   < > 23 26 27 27   GLUCOSE 138*   < > 85 165* 217* 85  BUN 14   < > 41* 23* 21* 21*  CREATININE 0.54   < > 0.91 0.71 0.82 0.68  CALCIUM 7.8*   < > 7.8* 7.7* 7.8* 7.9*  GFRNONAA >60   < > >60 >60 >60 >60  GFRAA >60   < > >60 >60 >60 >60  PROT 5.4*   < > 5.6* 5.6*  --  6.0*  ALBUMIN 1.7*   < > 1.9* 1.8*  --  1.9*  AST 23   < > 14* 16  --  26  ALT 14   < > 12 11  --  16  ALKPHOS 372*   < > 98 96  --  180*  BILITOT 2.0*   < > 1.6* 1.3*  --  1.6*  BILIDIR 1.0*  --   --   --   --   --   IBILI 1.0*  --   --   --   --   --    < > = values in this interval not displayed.    Studies:  Ct Angio Chest Pe W Or Wo Contrast  Result Date: 06/11/2018 CLINICAL DATA:  Shortness of breath. Metastatic breast cancer. EXAM: CT ANGIOGRAPHY CHEST WITH CONTRAST TECHNIQUE: Multidetector CT imaging of the chest was performed using the standard protocol during bolus administration of intravenous contrast. Multiplanar CT image reconstructions and MIPs were obtained to evaluate the vascular anatomy. CONTRAST:  68mL OMNIPAQUE IOHEXOL 350 MG/ML SOLN COMPARISON:  Chest radiograph 06/11/2018. Thoracic spine CT 05/19/2018. Chest CTA 04/27/2018. FINDINGS: Cardiovascular: Pulmonary arterial opacification is adequate without evidence of lobar more proximal emboli. No segmental emboli are identified although assessment is limited in some areas due to motion artifact. Mild thoracic aortic atherosclerosis is noted without aneurysm. The heart is enlarged. There is no pericardial effusion. A right jugular Port-A-Cath terminates in the high right atrium. Mediastinum/Nodes: Calcified subcarinal and right hilar lymph nodes are again noted. Mildly enlarged noncalcified lymph nodes including a 10 mm short axis right paratracheal lymph node, levin mm precarinal lymph node, an 11 mm  right hilar lymph node are unchanged from the prior chest CTA. The esophagus and thyroid are unremarkable. Lungs/Pleura: There are small bilateral pleural effusions, unchanged on the right and decreased in size on the left compared to the prior chest CTA. Extensive right lower lobe consolidation has greatly progressed from the prior chest CTA with progression also noted compared to the more recent thoracic spine CT. There is mildly improved aeration of the left lower lobe compared to the prior chest CTA, however moderately extensive consolidation and volume loss remain. A 2.5 x 2.2 cm right upper lobe nodule is unchanged from the thoracic spine CT though has enlarged from the chest CTA (series 10, image 51), as has an 8 mm nodule more anterolaterally in the right upper lobe (  series 10, image 50). Multiple subcentimeter nodules are present in the left upper lobe with some being larger compared to the prior chest CT and others being unchanged. Upper Abdomen: Liver lesions have decreased in size, for example a segment VII/VIII lesion now measures 16 mm (series 4, image 85, previously 23 mm). Calcified granulomas are noted in the spleen. Musculoskeletal: Unchanged subcentimeter sclerotic foci in the T11 and T12 vertebral bodies, left glenoid, and inferior right scapular body. Review of the MIP images confirms the above findings. IMPRESSION: 1. No evidence of pulmonary emboli. 2. Extensive bilateral lower lobe consolidation. 3. Small pleural effusions. 4. 2.5 cm right upper lobe nodule, enlarged from 04/27/2018 and which may reflect a metastasis or primary lung cancer. Stable to mild enlargement of subcentimeter nodules in both upper lobes consistent with known metastases. 5. Decreased size of liver metastases. 6. Unchanged mild mediastinal and right hilar lymphadenopathy. 7. Aortic Atherosclerosis (ICD10-I70.0). Electronically Signed   By: Logan Bores M.D.   On: 06/11/2018 17:02   Ct Thoracic Spine Wo  Contrast  Result Date: 05/19/2018 CLINICAL DATA:  Fall last week. Back pain and bilateral legs since fall. EXAM: CT THORACIC SPINE WITHOUT CONTRAST TECHNIQUE: Multidetector CT images of the thoracic were obtained using the standard protocol without intravenous contrast. COMPARISON:  CT chest 04/27/2018 FINDINGS: Alignment: Normal Vertebrae: Negative for fracture or metastatic disease. No lytic or sclerotic bone lesion identified. Paraspinal and other soft tissues: Extensive left lower lobe consolidation as noted previously. Progression of right lower lobe consolidation. Right upper lobe mass lesion approximately 2.4 cm is unchanged and may represent neoplasm. Calcified subcarinal lymph node unchanged. Disc levels: Disc spaces maintained without significant degenerative change or spinal stenosis. IMPRESSION: 1. Negative for fracture or metastatic disease thoracic spine 2. Bibasilar consolidation with progression on the right. Possible pneumonia or tumor. 2.4 cm right upper lobe spiculated mass may represent metastatic disease or bronchogenic carcinoma. Electronically Signed   By: Franchot Gallo M.D.   On: 05/19/2018 17:16   Ct Lumbar Spine Wo Contrast  Result Date: 05/19/2018 CLINICAL DATA:  Fall 1 week ago. Back pain. History of metastatic breast cancer. EXAM: CT LUMBAR SPINE WITHOUT CONTRAST TECHNIQUE: Multidetector CT imaging of the lumbar spine was performed without intravenous contrast administration. Multiplanar CT image reconstructions were also generated. COMPARISON:  CT abdomen pelvis 04/08/2018 FINDINGS: Segmentation: Normal Alignment: Normal Vertebrae: Negative for lumbar fracture. 3 mm sclerotic lesion L3 vertebral body. This has enlarged slightly from 03/11/2018 therefore is suspicious for metastatic disease. No other sclerotic or lytic lesions. Paraspinal and other soft tissues: Negative for paraspinous mass or adenopathy. Mild atherosclerotic aorta. Disc levels: Mild lumbar disc and facet  degeneration. Negative for disc protrusion or stenosis. IMPRESSION: 1. Negative for lumbar fracture 2. 3 mm sclerotic lesion L4 vertebral body likely due to metastatic disease. This has enlarged since 03/21/2018. Electronically Signed   By: Franchot Gallo M.D.   On: 05/19/2018 17:23   Ct Abdomen Pelvis W Contrast  Result Date: 05/19/2018 CLINICAL DATA:  Blunt abdominal trauma. EXAM: CT ABDOMEN AND PELVIS WITH CONTRAST TECHNIQUE: Multidetector CT imaging of the abdomen and pelvis was performed using the standard protocol following bolus administration of intravenous contrast. CONTRAST:  141mL OMNIPAQUE IOHEXOL 300 MG/ML  SOLN COMPARISON:  CT scan of April 08, 2018. FINDINGS: Lower chest: Bilateral posterior basilar atelectasis or pneumonia is noted. Hepatobiliary: No gallstones or biliary dilatation is noted. Intrahepatic metastatic lesions noted on prior exam are significantly smaller. 1.9 cm lesion is seen in peripheral portion  of right hepatic lobe which is decreased compared to prior exam where it measured 2.9 cm. 1.5 cm left hepatic lesion is noted which is significantly smaller compared to prior exam, where it measures 3.7 cm. Pancreas: Unremarkable. No pancreatic ductal dilatation or surrounding inflammatory changes. Spleen: Calcified splenic granulomata are noted. No other significant abnormality is noted. Adrenals/Urinary Tract: Adrenal glands are unremarkable. Kidneys are normal, without renal calculi, focal lesion, or hydronephrosis. Bladder is unremarkable. Stomach/Bowel: The stomach appears normal. There is no evidence of bowel obstruction or inflammation. The appendix is not clearly visualized. Vascular/Lymphatic: Aortic atherosclerosis. No enlarged abdominal or pelvic lymph nodes. Reproductive: Uterus and bilateral adnexa are unremarkable. Other: There is noted interval enlargement of the right iliacus muscle which is concerning for underlying hematoma. No hernia is noted. Musculoskeletal: No acute  or significant osseous findings. IMPRESSION: Interval development of enlargement of right iliacus muscle with surrounding soft tissue stranding concerning for underlying hematoma or possibly severe muscular injury. Hepatic metastatic lesions noted on prior exam is significantly smaller currently. Bilateral posterior basilar atelectasis or pneumonia is noted. Aortic Atherosclerosis (ICD10-I70.0). Electronically Signed   By: Marijo Conception M.D.   On: 05/19/2018 17:17   Dg Pelvis Portable  Result Date: 05/19/2018 CLINICAL DATA:  58 year old female with a fall a few years ago with bilateral hip pain EXAM: PORTABLE PELVIS 1-2 VIEWS COMPARISON:  None. FINDINGS: Bony pelvic ring intact, with no acute displaced fracture. Bilateral hips projects normally over the acetabula. Mild degenerative changes bilateral hips. Unremarkable proximal femurs. IMPRESSION: Negative for acute bony abnormality. Early bilateral hip osteoarthritis Electronically Signed   By: Corrie Mckusick D.O.   On: 05/19/2018 12:34   Dg Chest Port 1 View  Result Date: 06/12/2018 CLINICAL DATA:  58 year old female with possible pneumonia in cancer patient. EXAM: PORTABLE CHEST 1 VIEW COMPARISON:  06/11/2018 and earlier. FINDINGS: Portable AP semi upright view at 0943 hours. Stable right chest power port, accessed. Increasing confluence of mid and lower lung opacity. Consolidation demonstrated by CT yesterday. Trace pleural fluid more apparent by CT. No superimposed pneumothorax or pulmonary edema. Stable cardiac size and mediastinal contours. Visualized tracheal air column is within normal limits. IMPRESSION: Mild radiographic progression of bilateral consolidation/pneumonia since yesterday. Electronically Signed   By: Genevie Ann M.D.   On: 06/12/2018 10:37   Dg Chest Port 1 View  Result Date: 06/11/2018 CLINICAL DATA:  Hypotension. EXAM: PORTABLE CHEST 1 VIEW COMPARISON:  Radiograph of May 19, 2018. FINDINGS: Stable cardiomediastinal silhouette.  Right internal jugular Port-A-Cath is unchanged in position. No pneumothorax is noted. Increased bibasilar opacities are noted concerning for pneumonia or edema. Small pleural effusions may be present. Bony thorax is unremarkable. IMPRESSION: Increased bibasilar opacities are noted concerning for worsening pneumonia or edema. Small pleural effusions may be present. Electronically Signed   By: Marijo Conception M.D.   On: 06/11/2018 11:43   Dg Chest Port 1 View  Result Date: 05/19/2018 CLINICAL DATA:  Fall last Tuesday. Pain in both hips and legs. Fever. EXAM: PORTABLE CHEST 1 VIEW COMPARISON:  May 05, 2018 FINDINGS: The right Port-A-Cath terminates in the central SVC. There is mild opacity in left base, significantly improved since May 05, 2018. Most of the right-sided infiltrate is also resolved. Minimal opacity remains in the medial right upper lobe. No other interval changes. IMPRESSION: Mild opacities remain in the medial right upper lobe in the left base, significantly improved in the interval. The remainder of infiltrates previously seen have resolved. Recommend follow-up to complete  resolution. Electronically Signed   By: Dorise Bullion III M.D   On: 05/19/2018 12:33

## 2018-06-14 NOTE — Consult Note (Signed)
Cardiology Consultation:   Patient ID: Stacy Shelton MRN: [id number]; DOB: May 01, 1960  Admit date: 06/11/2018 Date of Consult: 06/14/2018  Primary Care Provider: Robyne Peers, MD Primary Cardiologist: Margaretann Loveless Primary Electrophysiologist:      Patient Profile:   Stacy Shelton is a 58 y.o. female with a hx of metastatic breast cancer  who is being seen today for the evaluation of tachycardia  at the request of Dr Radene Gunning.  History of Present Illness:   Stacy Shelton 58 y.o. unfortunate female with metastatic breast cancer diagnosed in February. She moved her to live with her 57 yo mother from [place] Works for [employer]. In March was admitted with sepsis and Dr Margaretann Loveless saw for tachycardia as well at that time Hct was 26.4  Telemetry with sinus tachycardia no arrhythmia or flutter. TTE 03/21/18 with normal EF 60-65% TSH normal Found to have multi lobar pneumonia Currently less dyspnea and trying to take better PO. No chest pain , palpitations or syncope   Review of her telemetry shows NSR rate 100-105 Past Medical History:  Diagnosis Date   Asthma    Diabetes ([place])    Metastatic breast cancer ([place])    Seasonal allergies     Past Surgical History:  Procedure Laterality Date   IR IMAGING GUIDED PORT INSERTION  03/24/2018   KIDNEY STONE SURGERY     SINOSCOPY     WISDOM TOOTH EXTRACTION       Home Medications:  Prior to Admission medications   Medication Sig Start Date End Date Taking? Authorizing Provider  albuterol (PROAIR HFA) 108 (90 Base) MCG/ACT inhaler Inhale 2 puffs into the lungs every 6 (six) hours as needed for wheezing or shortness of breath. 05/16/17  Yes Padgett, Rae Halsted, MD  esomeprazole (NEXIUM) 40 MG capsule Take 1 capsule (40 mg total) by mouth 2 (two) times daily before a meal. 05/16/17  Yes Padgett, Rae Halsted, MD  fluticasone (FLOVENT HFA) 44 MCG/ACT inhaler Inhale 2 puffs into the lungs 2 (two) times daily. 05/16/17  Yes Padgett, Rae Halsted, MD    insulin aspart (NOVOLOG) 100 UNIT/ML FlexPen Inject 8 Units into the skin 3 (three) times daily with meals. Patient taking differently: Inject 8 Units into the skin 3 (three) times daily with meals. Holds dose if not going to eat. 06/04/18  Yes Gorsuch, Ni, MD  Insulin Detemir (LEVEMIR) 100 UNIT/ML Pen Inject 10 Units into the skin daily. Patient taking differently: Inject 10 Units into the skin at bedtime.  05/06/18  Yes Dahal, Marlowe Aschoff, MD  lidocaine-prilocaine (EMLA) cream Apply to affected area once Patient taking differently: Apply 1 application topically as needed (port access). Apply to affected area once 03/21/18  Yes Gorsuch, Ni, MD  LORazepam (ATIVAN) 0.5 MG tablet Take 1 tab po 30 minutes prior to radiation or MRI Patient taking differently: Take 0.5 mg by mouth See admin instructions. Take 30 minutes prior to radiation or MRI 03/14/18  Yes Hayden Pedro, PA-C  metFORMIN (GLUCOPHAGE) 500 MG tablet Take 500 mg by mouth daily with breakfast.    Yes [provider]  metoprolol succinate (TOPROL-XL) 50 MG 24 hr tablet Take 50 mg by mouth every evening. 03/10/18  Yes [provider]  montelukast (SINGULAIR) 10 MG tablet Take 1 tablet (10 mg total) by mouth daily. 06/10/18  Yes Padgett, Rae Halsted, MD  morphine (MSIR) 15 MG tablet Take 1 tablet (15 mg total) by mouth every 4 (four) hours as needed for severe pain. 05/20/18  Yes Alvy Bimler,  Ni, MD  Multiple Vitamin (MULTIVITAMIN WITH MINERALS) TABS tablet Take 1 tablet by mouth daily. 05/07/18  Yes Dahal, Marlowe Aschoff, MD  ondansetron (ZOFRAN) 8 MG tablet Take 8 mg by mouth every 8 (eight) hours as needed for nausea or vomiting.    Yes [provider]  prochlorperazine (COMPAZINE) 10 MG tablet Take 10 mg by mouth every 6 (six) hours as needed for nausea or vomiting.    Yes [provider]  ACCU-CHEK AVIVA PLUS test strip  03/13/18   [provider]  ACCU-CHEK SOFTCLIX LANCETS lancets  03/13/18   [provider]  blood glucose meter kit and supplies KIT Dispense based on patient and insurance preference. Use up to four times daily as directed. (FOR ICD-9 250.00, 250.01). 03/13/18   Donne Hazel, MD  Insulin Pen Needle 31G X 5 MM MISC 1 Device by Does not apply route QID. For use with insulin pens 03/13/18   Donne Hazel, MD    Inpatient Medications: Scheduled Meds:  sodium chloride  250 mL Intravenous Once   amiodarone  200 mg Oral Daily   Chlorhexidine Gluconate Cloth  6 each Topical Daily   docusate sodium  100 mg Oral BID   feeding supplement (ENSURE ENLIVE)  237 mL Oral BID BM   insulin aspart  0-15 Units Subcutaneous TID WC   insulin aspart  8 Units Subcutaneous TID WC   insulin detemir  10 Units Subcutaneous QHS   mouth rinse  15 mL Mouth Rinse BID   metoprolol tartrate  25 mg Oral BID   mirtazapine  15 mg Oral QHS   montelukast  10 mg Oral Daily   multivitamin with minerals  1 tablet Oral Daily   pantoprazole sodium  40 mg Oral Daily   potassium chloride  40 mEq Oral BID   sodium chloride flush  10-40 mL Intracatheter Q12H   Continuous Infusions:  azithromycin Stopped (06/14/18 0006)   cefTRIAXone (ROCEPHIN)  IV Stopped (06/13/18 2244)   lactated ringers 10 mL/hr at 06/14/18 0600   phenylephrine (NEO-SYNEPHRINE) Adult infusion Stopped (06/14/18 0320)   PRN Meds: acetaminophen **OR** acetaminophen, albuterol, guaiFENesin, heparin lock flush, heparin lock flush, HYDROcodone-acetaminophen, ondansetron **OR** ondansetron (ZOFRAN) IV, prochlorperazine, senna-docusate, sodium chloride flush, sodium chloride flush, sodium chloride flush, sorbitol, traZODone  Allergies:    Allergies  Allergen Reactions   Nsaids Anaphylaxis    Social History:   Social History   Socioeconomic History   Marital status: Single    Spouse name: Not on file   Number of children: Not on file   Years of education: Not on file   Highest education level: Not on  file  Occupational History   Occupation: Engineer, site strain: Not on file   Food insecurity:    Worry: Not on file    Inability: Not on file   Transportation needs:    Medical: Not on file    Non-medical: Not on file  Tobacco Use   Smoking status: Never Smoker   Smokeless tobacco: Never Used  Substance and Sexual Activity   Alcohol use: Yes    Alcohol/week: 0.0 standard drinks    Comment: socially   Drug use: No   Sexual activity: Not on file  Lifestyle   Physical activity:    Days per week: Not on file    Minutes per session: Not on file   Stress: Not on file  Relationships   Social connections:  Talks on phone: Not on file    Gets together: Not on file    Attends religious service: Not on file    Active member of club or organization: Not on file    Attends meetings of clubs or organizations: Not on file    Relationship status: Not on file   Intimate partner violence:    Fear of current or ex partner: Not on file    Emotionally abused: Not on file    Physically abused: Not on file    Forced sexual activity: Not on file  Other Topics Concern   Not on file  Social History Narrative   Not on file    Family History:    Family History  Problem Relation Age of Onset   Allergic rhinitis Neg Hx    Angioedema Neg Hx    Asthma Neg Hx    Atopy Neg Hx    Eczema Neg Hx    Immunodeficiency Neg Hx    Urticaria Neg Hx      ROS:  Please see the history of present illness.   All other ROS reviewed and negative.     Physical Exam/Data:   Vitals:   06/14/18 0600 06/14/18 0700 06/14/18 0759 06/14/18 0800  BP: (!) 114/45 (!) 113/39  (!) 127/41  Pulse: (!) 103 (!) 107  (!) 110  Resp: (!) 25 (!) 36  (!) 35  Temp:  99.7 F (37.6 C)    TempSrc:  Oral    SpO2: 98% 97% 95% 95%  Weight:      Height:        Intake/Output Summary (Last 24 hours) at 06/14/2018 0919 Last data filed at 06/14/2018 0844 Gross per  24 hour  Intake 1615.52 ml  Output 1800 ml  Net -184.48 ml   Last 3 Weights 06/14/2018 06/13/2018 06/12/2018  Weight (lbs) 133 lb 9.6 oz 136 lb 7.4 oz 134 lb 11.2 oz  Weight (kg) 60.6 kg 61.9 kg 61.1 kg     Body mass index is 24.44 kg/m.  Affect appropriate Chronically ill female with alopecia  HEENT: normal Neck supple with no adenopathy JVP normal no bruits no thyromegaly Lungs clear with no wheezing and good diaphragmatic motion Heart:  S1/S2 no murmur, no rub, gallop or click PMI normal  Port a cath right chest  Abdomen: benighn, BS positve, no tenderness, no AAA no bruit.  No HSM or HJR Distal pulses intact with no bruits No edema Neuro non-focal Skin warm and dry No muscular weakness    EKG:  The EKG was personally reviewed and demonstrates: ST normal some ECGls cannot r/o short RP AVNRT Telemetry:  Telemetry was personally reviewed and demonstrates:  SR rate 105 currently   Relevant CV Studies: TTE pending TTE 03/21/18 normal EF 60-65%   Laboratory Data:  Chemistry Recent Labs  Lab 06/13/18 0146 06/13/18 1700 06/14/18 0206  NA 137 136 138  K 2.9* 2.9* 3.9  CL 102 97* 101  CO2 _0 GLUCOSE 165* 217* 85  BUN 23* 21* 21*  CREATININE 0.71 0.82 0.68  CALCIUM 7.7* 7.8* 7.9*  GFRNONAA >60 >60 >60  GFRAA >60 >60 >60  ANIONGAP _1 Recent Labs  Lab 06/12/18 0227 06/13/18 0146 06/14/18 0206  PROT 5.6* 5.6* 6.0*  ALBUMIN 1.9* 1.8* 1.9*  AST 14* 16 26  ALT _2 ALKPHOS 98 96 180*  BILITOT 1.6* 1.3* 1.6*   Hematology Recent Labs  Lab 06/12/18  3893  06/13/18 0146  06/13/18 2000 06/14/18 0206 06/14/18 0600  WBC 11.1*  --  9.7  --   --  14.9*  --   RBC 2.98*  --  2.89*  --   --  3.06*  --   HGB 8.5*   < > 8.2*   < > 8.3* 8.5* 7.9*  HCT 25.7*   < > 25.0*   < > 25.1* 27.4* 24.6*  MCV 86.2  --  86.5  --   --  89.5  --   MCH 28.5  --  28.4  --   --  27.8  --   MCHC 33.1  --  32.8  --   --  31.0  --   RDW 15.9*  --  15.9*  --   --  16.7*   --   PLT 41*  --  36*  --   --  36*  --    < > = values in this interval not displayed.   Cardiac EnzymesNo results for input(s): TROPONINI in the last 168 hours. No results for input(s): TROPIPOC in the last 168 hours.  BNP Recent Labs  Lab 06/11/18 2030  BNP 809.0*    DDimer  Recent Labs  Lab 06/11/18 1640  DDIMER 3.92*    Radiology/Studies:  Ct Angio Chest Pe W Or Wo Contrast  Result Date: 06/11/2018 CLINICAL DATA:  Shortness of breath. Metastatic breast cancer. EXAM: CT ANGIOGRAPHY CHEST WITH CONTRAST TECHNIQUE: Multidetector CT imaging of the chest was performed using the standard protocol during bolus administration of intravenous contrast. Multiplanar CT image reconstructions and MIPs were obtained to evaluate the vascular anatomy. CONTRAST:  48m OMNIPAQUE IOHEXOL 350 MG/ML SOLN COMPARISON:  Chest radiograph 06/11/2018. Thoracic spine CT 05/19/2018. Chest CTA 04/27/2018. FINDINGS: Cardiovascular: Pulmonary arterial opacification is adequate without evidence of lobar more proximal emboli. No segmental emboli are identified although assessment is limited in some areas due to motion artifact. Mild thoracic aortic atherosclerosis is noted without aneurysm. The heart is enlarged. There is no pericardial effusion. A right jugular Port-A-Cath terminates in the high right atrium. Mediastinum/Nodes: Calcified subcarinal and right hilar lymph nodes are again noted. Mildly enlarged noncalcified lymph nodes including a 10 mm short axis right paratracheal lymph node, levin mm precarinal lymph node, an 11 mm right hilar lymph node are unchanged from the prior chest CTA. The esophagus and thyroid are unremarkable. Lungs/Pleura: There are small bilateral pleural effusions, unchanged on the right and decreased in size on the left compared to the prior chest CTA. Extensive right lower lobe consolidation has greatly progressed from the prior chest CTA with progression also noted compared to the more  recent thoracic spine CT. There is mildly improved aeration of the left lower lobe compared to the prior chest CTA, however moderately extensive consolidation and volume loss remain. A 2.5 x 2.2 cm right upper lobe nodule is unchanged from the thoracic spine CT though has enlarged from the chest CTA (series 10, image 51), as has an 8 mm nodule more anterolaterally in the right upper lobe (series 10, image 50). Multiple subcentimeter nodules are present in the left upper lobe with some being larger compared to the prior chest CT and others being unchanged. Upper Abdomen: Liver lesions have decreased in size, for example a segment VII/VIII lesion now measures 16 mm (series 4, image 85, previously 23 mm). Calcified granulomas are noted in the spleen. Musculoskeletal: Unchanged subcentimeter sclerotic foci in the T11 and T12 vertebral bodies, left glenoid,  and inferior right scapular body. Review of the MIP images confirms the above findings. IMPRESSION: 1. No evidence of pulmonary emboli. 2. Extensive bilateral lower lobe consolidation. 3. Small pleural effusions. 4. 2.5 cm right upper lobe nodule, enlarged from 04/27/2018 and which may reflect a metastasis or primary lung cancer. Stable to mild enlargement of subcentimeter nodules in both upper lobes consistent with known metastases. 5. Decreased size of liver metastases. 6. Unchanged mild mediastinal and right hilar lymphadenopathy. 7. Aortic Atherosclerosis (ICD10-I70.0). Electronically Signed   By: Logan Bores M.D.   On: 06/11/2018 17:02   Dg Chest Port 1 View  Result Date: 06/12/2018 CLINICAL DATA:  58 year old female with possible pneumonia in cancer patient. EXAM: PORTABLE CHEST 1 VIEW COMPARISON:  06/11/2018 and earlier. FINDINGS: Portable AP semi upright view at 0943 hours. Stable right chest power port, accessed. Increasing confluence of mid and lower lung opacity. Consolidation demonstrated by CT yesterday. Trace pleural fluid more apparent by CT. No  superimposed pneumothorax or pulmonary edema. Stable cardiac size and mediastinal contours. Visualized tracheal air column is within normal limits. IMPRESSION: Mild radiographic progression of bilateral consolidation/pneumonia since yesterday. Electronically Signed   By: Genevie Ann M.D.   On: 06/12/2018 10:37   Dg Chest Port 1 View  Result Date: 06/11/2018 CLINICAL DATA:  Hypotension. EXAM: PORTABLE CHEST 1 VIEW COMPARISON:  Radiograph of May 19, 2018. FINDINGS: Stable cardiomediastinal silhouette. Right internal jugular Port-A-Cath is unchanged in position. No pneumothorax is noted. Increased bibasilar opacities are noted concerning for pneumonia or edema. Small pleural effusions may be present. Bony thorax is unremarkable. IMPRESSION: Increased bibasilar opacities are noted concerning for worsening pneumonia or edema. Small pleural effusions may be present. Electronically Signed   By: Marijo Conception M.D.   On: 06/11/2018 11:43    Assessment and Plan:   1. Tachycardia:  Currently she clearly has Sinus tachycardia which is likely from recovering sepsis, anemia and hypermetabolic state of metastatic cancer. TTE is pending no further w/u if EF remains normal Concern for new cardiomyopathy after chemo Rx. I reviewed her ECG;s from March and some of them you cannot r/o that she has had AVNRT with short PR. In future would consider Corlanor as agent to slow her tachycardia since beta blockers and cardizem have not been very effective       For questions or updates, please contact [place] Please consult [email or web address] for contact info under     Signed, Jenkins Rouge, MD  06/14/2018 9:19 AM

## 2018-06-14 NOTE — Progress Notes (Signed)
   NAME:  Stacy Shelton, MRN:  [id number], DOB:  [birth year], LOS: 3 ADMISSION DATE:  06/11/2018, CONSULTATION DATE:  06/11/2018 REFERRING MD:  Gilford Rile, CHIEF COMPLAINT:  Hypotension.   Brief History   58 year old woman undergoing active chemotherapy for metastatic breast cancer who presents with hypotension and severe anemia.  Last received chemotherapy from Dr Ernst Spell on 4/29 (Taxotere, Herceptin, Perjeta) and was feeling well until she developed severe nausea and vomiting 2 days ago. Associated dyspnea started afterwards.  Past Medical History  Asthma, DM, metastatic breast CA, seasonal allergies    Significant Hospital Events   Admitted from clinic 5/13 and given fluids and transfusion ordered.  Started on empiric antibiotics. 5/13->5/14: Hemoglobin 6.2>> 3 units PRBC.  Febrile overnight, max 103.1, during evening developed SVT.  Treated initially with adenosine, initially converted to sinus tachycardia.  Electrolytes corrected, fever treated, required phenylephrine infusion for hypotension, eventually added amiodarone infusion Consults:  PCCM  Procedures:  N/A  Significant Diagnostic Tests:  CXR 5/13 - mild bibasilar interstitial infiltrates and small effusions. CTA chest 5/13 - no PE.  Micro Data:  Blood cultures 5/13 - negative to date COVID 5/13 - negative   Antimicrobials:  Ceftriaxone 5/13>>>  Azithromycin 5/13>>>  Interim history/subjective:   Complains of cough all night but feels better this morning Low-grade fevers 100.8  Objective   Blood pressure (!) 127/41, pulse (!) 110, temperature 99.7 F (37.6 C), temperature source Oral, resp. rate (!) 35, height 5\' 2"  (1.575 m), weight 60.6 kg, last menstrual period 10/30/2010, SpO2 95 %.        Intake/Output Summary (Last 24 hours) at 06/14/2018 0926 Last data filed at 06/14/2018 0844 Gross per 24 hour  Intake 1615.52 ml  Output 1800 ml  Net -184.48 ml   Filed Weights   06/12/18 0500 06/13/18 0500 06/14/18 0500   Weight: 61.1 kg 61.9 kg 60.6 kg    Examination: General: Large woman, no distress able to speak in full sentences lying supine HEENT pallor, no icterus, no JVD Pulmonary: Fine bibasilar crackles, no rhonchi Cardiac: S1S2 normal with no murmur or gallop/ Abdomen: Soft, not tender no organomegaly Extremities: Warm and dry brisk capillary refill strong pulses no clubbing no edema Neuro: Awake oriented no focal deficits GU voids spontaneously  Resolved Hospital Problem list   Glycemia, nonketotic state  Assessment & Plan:    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  Now back in sinus rhythm On oral metoprolol and amiodarone.  Repeat echo pending Will obtain cardiology consult Lasix 40 PO now that off pressors  Acute on chronic hypoxic respiratory failure with bibasilar pneumonia, versus heart failure Complete course of antibiotics.    Hypokalemia - repleted  Panytopenia: Presume this is secondary to bone marrow suppression from chemotherapy -Received 3 units of PRBCs Transfuse PRBC for hemoglobin less than 7 or active bleeding Platelets only if less than 10K   History of diabetes Plan Continue Lantus 10 units Sliding scale insulin  Metastatic breast cancer.  No active treatment at this time. Plan Follow-up with heme-onc  Transfer to stepdown unit and to triad 5/17  Kara Mead MD. FCCP. [facility] Pager [phone] If no response call [phone]   06/14/2018

## 2018-06-15 DIAGNOSIS — G893 Neoplasm related pain (acute) (chronic): Secondary | ICD-10-CM

## 2018-06-15 DIAGNOSIS — D6959 Other secondary thrombocytopenia: Secondary | ICD-10-CM

## 2018-06-15 DIAGNOSIS — I5032 Chronic diastolic (congestive) heart failure: Secondary | ICD-10-CM

## 2018-06-15 DIAGNOSIS — C7931 Secondary malignant neoplasm of brain: Secondary | ICD-10-CM

## 2018-06-15 LAB — MAGNESIUM: Magnesium: 1.8 mg/dL (ref 1.7–2.4)

## 2018-06-15 LAB — BASIC METABOLIC PANEL
Anion gap: 9 (ref 5–15)
BUN: 17 mg/dL (ref 6–20)
CO2: 28 mmol/L (ref 22–32)
Calcium: 7.6 mg/dL — ABNORMAL LOW (ref 8.9–10.3)
Chloride: 102 mmol/L (ref 98–111)
Creatinine, Ser: 0.63 mg/dL (ref 0.44–1.00)
GFR calc Af Amer: >60 mL/min (ref >60)
GFR calc non Af Amer: >60 mL/min (ref >60)
Glucose, Bld: 76 mg/dL (ref 70–99)
Potassium: 3.7 mmol/L (ref 3.5–5.1)
Sodium: 139 mmol/L (ref 135–145)

## 2018-06-15 LAB — GLUCOSE, CAPILLARY
Glucose-Capillary: 64 mg/dL — ABNORMAL LOW (ref 70–99)
Glucose-Capillary: 147 mg/dL — ABNORMAL HIGH (ref 70–99)
Glucose-Capillary: 169 mg/dL — ABNORMAL HIGH (ref 70–99)
Glucose-Capillary: 235 mg/dL — ABNORMAL HIGH (ref 70–99)

## 2018-06-15 LAB — CBC
HCT: 19.4 % — ABNORMAL LOW (ref 36.0–46.0)
Hemoglobin: 6.1 g/dL — CL (ref 12.0–15.0)
MCH: 28.5 pg (ref 26.0–34.0)
MCHC: 31.4 g/dL (ref 30.0–36.0)
MCV: 90.7 fL (ref 80.0–100.0)
Platelets: 22 10*3/uL — CL (ref 150–400)
RBC: 2.14 MIL/uL — ABNORMAL LOW (ref 3.87–5.11)
RDW: 17 % — ABNORMAL HIGH (ref 11.5–15.5)
WBC: 8.3 10*3/uL (ref 4.0–10.5)
nRBC: 1 % — ABNORMAL HIGH (ref 0.0–0.2)

## 2018-06-15 LAB — HEMOGLOBIN AND HEMATOCRIT, BLOOD
HCT: 24.8 % — ABNORMAL LOW (ref 36.0–46.0)
Hemoglobin: 8 g/dL — ABNORMAL LOW (ref 12.0–15.0)

## 2018-06-15 LAB — PREPARE RBC (CROSSMATCH)

## 2018-06-15 MED ORDER — GUAIFENESIN-CODEINE 100-10 MG/5ML PO SOLN
5.0000 mL | ORAL | Status: DC | PRN
  Administered 2018-06-15 – 2018-06-16 (×4): 5 mL via ORAL
  Filled 2018-06-15 (×4): qty 5

## 2018-06-15 MED ORDER — SODIUM CHLORIDE 0.9% IV SOLUTION
Freq: Once | INTRAVENOUS | Status: AC
  Administered 2018-06-15: 14:00:00 via INTRAVENOUS

## 2018-06-15 NOTE — Progress Notes (Signed)
PROGRESS NOTE    Stacy Shelton  NKN:[id number] DOB: 1960-04-20 DOA: 06/11/2018 PCP: Robyne Peers, MD   Brief Narrative:  Stacy Shelton is a pleasant 58 year old woman with history of metastatic breast cancer who is currently undergoing chemotherapy by Dr. Ernst Spell with last session on 05/28/2018 (Taxotere, Herceptin, Perjeta). she was feeling fine until 06/11/2018 when she started having nausea, vomiting and generalized weakness and went to see her oncologist and was found to be having anemia with hemoglobin of 6.2 and acute kidney injury with creatinine of 1.63 and was hypotensive and hyperglycemic so she was sent to the emergency department for further management.  She was initially admitted under hospitalist service but due to deterioration and requirement of vasopressors, she was transferred under PCCM/intensivist care.  She received total of 3 units of PRBC and vasopressors for some time.  She then he started having fever and was diagnosed with pneumonia and was started on IV Rocephin and Zithromax.  She also developed some SVT on 06/14/2018 for which cardiology was consulted.  She was given a dose of adenosine and she was then started on amiodarone.  Transthoracic echo did not show any acute pathology.  She was transferred under hospitalist service on 06/14/2018.  Consultants:   Cardiology, oncology, PCCM  Procedures:   None  Antimicrobials:   Rocephin and Zithromax   Subjective: Patient seen and examined.  She states that she feels slightly better.  She did not have any specific complaint.  Denied any fever, chills, chest pain, shortness of breath or any other complaint.  Objective: Vitals:   06/15/18 0600 06/15/18 0630 06/15/18 0800 06/15/18 0900  BP: (!) 95/41 (!) 106/36 (!) 115/36 (!) 133/48  Pulse: 89 93 (!) 104 (!) 116  Resp: (!) 33 (!) 36 (!) 33 (!) 34  Temp:   98.6 F (37 C)   TempSrc:   Oral   SpO2: 100% 98% 98% 94%  Weight:      Height:        Intake/Output Summary (Last  24 hours) at 06/15/2018 1030 Last data filed at 06/15/2018 0200 Gross per 24 hour  Intake 770 ml  Output 800 ml  Net -30 ml   Filed Weights   06/13/18 0500 06/14/18 0500 06/15/18 0500  Weight: 61.9 kg 60.6 kg 61 kg    Examination:  General exam: Appears calm and comfortable, looks pale Respiratory system: Fine bibasilar crackles and rhonchi at the bases bilaterally. Respiratory effort normal. Cardiovascular system: S1 & S2 heard, RRR. No JVD, murmurs, rubs, gallops or clicks. No pedal edema. Gastrointestinal system: Abdomen is nondistended, soft and nontender. No organomegaly or masses felt. Normal bowel sounds heard. Central nervous system: Alert and oriented. No focal neurological deficits. Extremities: Symmetric 5 x 5 power. Skin: No rashes, lesions or ulcers Psychiatry: Judgement and insight appear normal. Mood & affect appropriate.    Data Reviewed: I have personally reviewed following labs and imaging studies  CBC: Recent Labs  Lab 06/11/18 0905  06/12/18 0227 06/12/18 0806  06/13/18 0146  06/13/18 1406 06/13/18 2000 06/14/18 0206 06/14/18 0600 06/15/18 0500  WBC 15.3*   < > 9.6 11.1*  --  9.7  --   --   --  14.9*  --  8.3  NEUTROABS 10.2*  --  5.5  --   --  5.7  --   --   --  8.1*  --   --   HGB 6.2*   < > 7.8* 8.5*   < > 8.2*   < >  8.7* 8.3* 8.5* 7.9* 6.1*  HCT 20.0*   < > 23.5* 25.7*   < > 25.0*   < > 26.2* 25.1* 27.4* 24.6* 19.4*  MCV 86.2   < > 86.4 86.2  --  86.5  --   --   --  89.5  --  90.7  PLT 63*   < > 39* 41*  --  36*  --   --   --  36*  --  22*   < > = values in this interval not displayed.   Basic Metabolic Panel: Recent Labs  Lab 06/11/18 2030  06/12/18 0227 06/13/18 0146 06/13/18 1700 06/14/18 0206 06/15/18 0500  NA  --   --  137 137 136 138 139  K  --    < > 3.7 2.9* 2.9* 3.9 3.7  CL  --   --  105 102 97* 101 102  CO2  --   --  23 26 27 27 28   GLUCOSE  --   --  85 165* 217* 85 76  BUN  --   --  41* 23* 21* 21* 17  CREATININE 1.04*  --   0.91 0.71 0.82 0.68 0.63  CALCIUM 8.2*  --  7.8* 7.7* 7.8* 7.9* 7.6*  MG 2.1  --   --   --  2.2 1.9 1.8  PHOS 2.6  --   --   --   --   --   --    < > = values in this interval not displayed.   GFR: Estimated Creatinine Clearance: 65.9 mL/min (by C-G formula based on SCr of 0.63 mg/dL). Liver Function Tests: Recent Labs  Lab 06/11/18 0905 06/12/18 0227 06/13/18 0146 06/14/18 0206  AST 11* 14* 16 26  ALT 13 12 11 16   ALKPHOS 137* 98 96 180*  BILITOT 1.3* 1.6* 1.3* 1.6*  PROT 7.0 5.6* 5.6* 6.0*  ALBUMIN 2.1* 1.9* 1.8* 1.9*   No results for input(s): LIPASE, AMYLASE in the last 168 hours. No results for input(s): AMMONIA in the last 168 hours. Coagulation Profile: Recent Labs  Lab 06/12/18 0227  INR 1.5*   Cardiac Enzymes: No results for input(s): CKTOTAL, CKMB, CKMBINDEX, TROPONINI in the last 168 hours. BNP (last 3 results) No results for input(s): PROBNP in the last 8760 hours. HbA1C: No results for input(s): HGBA1C in the last 72 hours. CBG: Recent Labs  Lab 06/14/18 0744 06/14/18 1241 06/14/18 1650 06/14/18 2105 06/15/18 0740  GLUCAP 70 132* 215* 103* 64*   Lipid Profile: No results for input(s): CHOL, HDL, LDLCALC, TRIG, CHOLHDL, LDLDIRECT in the last 72 hours. Thyroid Function Tests: No results for input(s): TSH, T4TOTAL, FREET4, T3FREE, THYROIDAB in the last 72 hours. Anemia Panel: No results for input(s): VITAMINB12, FOLATE, FERRITIN, TIBC, IRON, RETICCTPCT in the last 72 hours. Sepsis Labs: Recent Labs  Lab 06/11/18 1055 06/11/18 1640 06/12/18 0227 06/13/18 0146  PROCALCITON  --  31.64 32.72 18.35  LATICACIDVEN 2.3* 2.6*  --   --     Recent Results (from the past 240 hour(s))  Blood Culture (routine x 2)     Status: None (Preliminary result)   Collection Time: 06/11/18 10:55 AM  Result Value Ref Range Status   Specimen Description   Final    BLOOD LEFT ARM Performed at [facility], [place] [address] [place], [place] [address]     Special Requests   Final    BOTTLES DRAWN AEROBIC AND ANAEROBIC Blood Culture adequate volume Performed  at [facility], [place] [address] [place], [place] [address]    Culture   Final    NO GROWTH 3 DAYS Performed at [facility], [place] [address], [place], [place] [address]    Report Status PENDING  Incomplete  Urine culture     Status: Abnormal   Collection Time: 06/11/18 10:55 AM  Result Value Ref Range Status   Specimen Description   Final    Urine Performed at [place] [address], [place], [place] [address]    Special Requests   Final    Immunocompromised Performed at [facility], [place] [address] [place], [place] [address]    Culture (A)  Final    <10,000 COLONIES/mL INSIGNIFICANT GROWTH Performed at [place] [address], [place], [place] [address]    Report Status 06/14/2018 FINAL  Final  Blood Culture (routine x 2)     Status: None (Preliminary result)   Collection Time: 06/11/18 11:00 AM  Result Value Ref Range Status   Specimen Description   Final    BLOOD CHEST RIGHT Performed at [place] [address], [place], [place] [address]    Special Requests   Final    BOTTLES DRAWN AEROBIC AND ANAEROBIC Blood Culture adequate volume Performed at [place] [address] [place], [place] [address]    Culture   Final    NO GROWTH 3 DAYS Performed at [facility], [place] [address], [place],  [address]    Report Status PENDING  Incomplete  SARS Coronavirus 2 (CEPHEID - Performed in [place] hospital lab), Hosp Order     Status: None   Collection Time: 06/11/18 11:50 AM  Result Value Ref Range Status   SARS Coronavirus 2 NEGATIVE NEGATIVE Final    Comment: (NOTE) If result is NEGATIVE SARS-CoV-2 target nucleic acids are NOT DETECTED. The SARS-CoV-2 RNA is generally detectable in upper and lower  respiratory specimens during  the acute phase of infection. The lowest  concentration of SARS-CoV-2 viral copies this assay can detect is 250  copies / mL. A negative result does not preclude SARS-CoV-2 infection  and should not be used as the sole basis for treatment or other  patient management decisions.  A negative result may occur with  improper specimen collection / handling, submission of specimen other  than nasopharyngeal swab, presence of viral mutation(s) within the  areas targeted by this assay, and inadequate number of viral copies  (<250 copies / mL). A negative result must be combined with clinical  observations, patient history, and epidemiological information. If result is POSITIVE SARS-CoV-2 target nucleic acids are DETECTED. The SARS-CoV-2 RNA is generally detectable in upper and lower  respiratory specimens dur ing the acute phase of infection.  Positive  results are indicative of active infection with SARS-CoV-2.  Clinical  correlation with patient history and other diagnostic information is  necessary to determine patient infection status.  Positive results do  not rule out bacterial infection or co-infection with other viruses. If result is PRESUMPTIVE POSTIVE SARS-CoV-2 nucleic acids MAY BE PRESENT.   A presumptive positive result was obtained on the submitted specimen  and confirmed on repeat testing.  While 2019 novel coronavirus  (SARS-CoV-2) nucleic acids may be present in the submitted sample  additional confirmatory testing may be necessary for epidemiological  and / or clinical management purposes  to differentiate between  SARS-CoV-2 and other Sarbecovirus  currently known to infect humans.  If clinically indicated additional testing with an alternate test  methodology [phone]) is advised. The SARS-CoV-2 RNA is generally  detectable in upper and lower respiratory sp ecimens during the acute  phase of infection. The expected result is Negative. Fact Sheet for Patients:   StrictlyIdeas.no Fact Sheet for Healthcare Providers: BankingDealers.co.za This test is not yet approved or cleared by the [place] FDA and has been authorized for detection and/or diagnosis of SARS-CoV-2 by FDA under an Emergency Use Authorization (EUA).  This EUA will remain in effect (meaning this test can be used) for the duration of the COVID-19 declaration under Section 564(b)(1) of the Act, 21 U.S.C. section 360bbb-3(b)(1), unless the authorization is terminated or revoked sooner. Performed at [facility], [place] [address], [place], [place] [address]   MRSA PCR Screening     Status: None   Collection Time: 06/11/18  3:57 PM  Result Value Ref Range Status   MRSA by PCR NEGATIVE NEGATIVE Final    Comment:        The GeneXpert MRSA Assay (FDA approved for NASAL specimens only), is one component of a comprehensive MRSA colonization surveillance program. It is not intended to diagnose MRSA infection nor to guide or monitor treatment for MRSA infections. Performed at [facility], [place] [address] [place], [place] [address]       Radiology Studies: No results found.  Scheduled Meds: . sodium chloride  250 mL Intravenous Once  . sodium chloride   Intravenous Once  . amiodarone  200 mg Oral Daily  . Chlorhexidine Gluconate Cloth  6 each Topical Daily  . docusate sodium  100 mg Oral BID  . feeding supplement (ENSURE ENLIVE)  237 mL Oral BID BM  . insulin aspart  0-15 Units Subcutaneous TID WC  . insulin aspart  8 Units Subcutaneous TID WC  . insulin detemir  10 Units Subcutaneous QHS  . mouth rinse  15 mL Mouth Rinse BID  . metoprolol tartrate  25 mg Oral BID  . mirtazapine  15 mg Oral QHS  . montelukast  10 mg Oral Daily  . multivitamin with minerals  1 tablet Oral Daily  . pantoprazole sodium  40 mg Oral Daily  . sodium chloride flush  10-40 mL Intracatheter Q12H   Continuous  Infusions: . azithromycin Stopped (06/14/18 2226)  . cefTRIAXone (ROCEPHIN)  IV Stopped (06/14/18 2154)  . lactated ringers 10 mL/hr at 06/14/18 1200     LOS: 4 days   Assessment & Plan:   Active Problems:   Thrombocytopenia ([place])   Uncontrolled diabetes mellitus with hyperglycemia (HCC)   Metastatic breast cancer (HCC)   Metastasis to brain ([place])   Metastasis to bone ([place])   Sepsis ([place])   Pneumonia due to infectious organism   Chronic diastolic CHF (congestive heart failure) (HCC)   Anemia associated with chemotherapy   Chemotherapy-induced thrombocytopenia   SVT (supraventricular tachycardia) (HCC)   Cancer associated pain   Metastasis to lung (HCC)   Acute renal failure (ARF) (HCC)   Severe anemia   Pressure injury of skin   Tachycardia   Acute diastolic heart failure (HCC)  Acute hypoxic respiratory failure Secondary to healthcare associated pneumonia: Due to being a cancer patient receiving active chemotherapy, she qualifies for healthcare associated pneumonia.  She has been on Rocephin and Zithromax, I will switch them to IV Zosyn.  She has been afebrile with T-max of 101.1 in last 24 hours.  Slightly  tachycardic.  Has been tested negative for COVID.  All the cultures are negative so far.  Will check for urine antigen and Legionella.  Procalcitonin improving.  SVT: ?  Chemotherapy-induced cardiomyopathy.  Now in sinus rhythm.  Seen by cardiology.  Continues to be on oral metoprolol and amiodarone.  Monitor on telemetry.  Echo with no changes.  Acute on chronic anemia: Again likely due to chemotherapy.  Received total of 3 units of PRBC transfusion so far and currently hemoglobin 6.1.  Will transfuse 1 more unit.  Oncology on board.  Check CBC daily.  Thrombocytopenia: Again likely due to chemotherapy.  Platelets around 20.  No signs of bleeding.  Monitor daily.  Oncology on board.  Type 2 diabetes mellitus: Controlled.  Continue Lantus and sliding scale insulin.   Metastatic breast cancer: Management per heme-onc.  History of asthma: None at an acute exacerbation.  Continue Singulair.  DVT prophylaxis: SCD Code Status: Full code Family Communication: Discussed with patient Disposition Plan: Remains in ICU   Time spent: 40 minutes   Darliss Cheney, MD [facility] Pager [phone]  If 7PM-7AM, please contact night-coverage [email or web address] Password [email or web address] 06/15/2018, 10:30 AM

## 2018-06-15 NOTE — Progress Notes (Signed)
CRITICAL VALUE ALERT  Critical Value:  Hgb 6.1 PLT 22  Date & Time Notied:   0615 06/15/2018  Provider Notified: [facility] notified   Orders Received/Actions taken: Awaiting orders

## 2018-06-15 NOTE — Progress Notes (Signed)
Progress Note  Patient Name: Stacy Shelton Date of Encounter: 06/15/2018  Primary Cardiologist: Margaretann Loveless  Subjective   Sleepy no cardiac complaints   Inpatient Medications    Scheduled Meds: . sodium chloride  250 mL Intravenous Once  . amiodarone  200 mg Oral Daily  . Chlorhexidine Gluconate Cloth  6 each Topical Daily  . docusate sodium  100 mg Oral BID  . feeding supplement (ENSURE ENLIVE)  237 mL Oral BID BM  . insulin aspart  0-15 Units Subcutaneous TID WC  . insulin aspart  8 Units Subcutaneous TID WC  . insulin detemir  10 Units Subcutaneous QHS  . mouth rinse  15 mL Mouth Rinse BID  . metoprolol tartrate  25 mg Oral BID  . mirtazapine  15 mg Oral QHS  . montelukast  10 mg Oral Daily  . multivitamin with minerals  1 tablet Oral Daily  . pantoprazole sodium  40 mg Oral Daily  . sodium chloride flush  10-40 mL Intracatheter Q12H   Continuous Infusions: . azithromycin Stopped (06/14/18 2226)  . cefTRIAXone (ROCEPHIN)  IV Stopped (06/14/18 2154)  . lactated ringers 10 mL/hr at 06/14/18 1200   PRN Meds: acetaminophen **OR** acetaminophen, albuterol, benzonatate, guaiFENesin, heparin lock flush, heparin lock flush, HYDROcodone-acetaminophen, ondansetron **OR** ondansetron (ZOFRAN) IV, prochlorperazine, senna-docusate, sodium chloride flush, sodium chloride flush, sodium chloride flush, sorbitol, traZODone   Vital Signs    Vitals:   06/15/18 0400 06/15/18 0500 06/15/18 0600 06/15/18 0630  BP: (!) 92/28 (!) 89/41 (!) 95/41 (!) 106/36  Pulse: 94 90 89 93  Resp: (!) 31 (!) 28 (!) 33 (!) 36  Temp: 97.8 F (36.6 C)     TempSrc: Axillary     SpO2: 98% 99% 100% 98%  Weight:  61 kg    Height:        Intake/Output Summary (Last 24 hours) at 06/15/2018 0730 Last data filed at 06/15/2018 0200 Gross per 24 hour  Intake 790 ml  Output 950 ml  Net -160 ml   Last 3 Weights 06/15/2018 06/14/2018 06/13/2018  Weight (lbs) 134 lb 7.7 oz 133 lb 9.6 oz 136 lb 7.4 oz  Weight (kg)  61 kg 60.6 kg 61.9 kg      Telemetry    SR/ST rate 100 - Personally Reviewed  ECG    SR/ST ? Some ECGls with short PR AVNRT - Personally Reviewed  Physical Exam  Alopecia GEN: No acute distress.   Neck: No JVD right side port a cath  Cardiac: RRR, no murmurs, rubs, or gallops.  Respiratory: Clear to auscultation bilaterally. GI: Soft, nontender, non-distended  MS: No edema; No deformity. Neuro:  Nonfocal  Psych: Normal affect   Labs    Chemistry Recent Labs  Lab 06/12/18 0227 06/13/18 0146 06/13/18 1700 06/14/18 0206 06/15/18 0500  NA 137 137 136 138 139  K 3.7 2.9* 2.9* 3.9 3.7  CL 105 102 97* 101 102  CO2 23 26 27 27 28   GLUCOSE 85 165* 217* 85 76  BUN 41* 23* 21* 21* 17  CREATININE 0.91 0.71 0.82 0.68 0.63  CALCIUM 7.8* 7.7* 7.8* 7.9* 7.6*  PROT 5.6* 5.6*  --  6.0*  --   ALBUMIN 1.9* 1.8*  --  1.9*  --   AST 14* 16  --  26  --   ALT 12 11  --  16  --   ALKPHOS 98 96  --  180*  --   BILITOT 1.6* 1.3*  --  1.6*  --   GFRNONAA >60 >60 >60 >60 >60  GFRAA >60 >60 >60 >60 >60  ANIONGAP 9 9 12 10 9      Hematology Recent Labs  Lab 06/13/18 0146  06/14/18 0206 06/14/18 0600 06/15/18 0500  WBC 9.7  --  14.9*  --  8.3  RBC 2.89*  --  3.06*  --  2.14*  HGB 8.2*   < > 8.5* 7.9* 6.1*  HCT 25.0*   < > 27.4* 24.6* 19.4*  MCV 86.5  --  89.5  --  90.7  MCH 28.4  --  27.8  --  28.5  MCHC 32.8  --  31.0  --  31.4  RDW 15.9*  --  16.7*  --  17.0*  PLT 36*  --  36*  --  22*   < > = values in this interval not displayed.    Cardiac EnzymesNo results for input(s): TROPONINI in the last 168 hours. No results for input(s): TROPIPOC in the last 168 hours.   BNP Recent Labs  Lab 06/11/18 2030  BNP 809.0*     DDimer  Recent Labs  Lab 06/11/18 1640  DDIMER 3.92*     Radiology    No results found.  Cardiac Studies   EF 60-65% trivial pericardial effusion abnormal septal bounce   Patient Profile     58 y.o. female with tachycardia in setting of  metastatic breast CA  Assessment & Plan    Tachycardia:  She in in NSR related to hypermetabolic state of cancer and anemia. Not pathologic Abnormal septal bounce on echo is not constriction CT chest shows normal pericardium and respirometer doppler study not needed No evidence of DCM from chemo Rx No further cardiac w/u needed In future can consider Corlanor for sinus tachycardia as she will likely have higher rates when she is sick or with more metastatic spread of her cancer   [facility] will sign off.   Medication Recommendations:  None Other recommendations (labs, testing, etc):  None  Follow up as an outpatient:  None   For questions or updates, please contact [facility] Please consult [email or web address] for contact info under        Signed, Jenkins Rouge, MD  06/15/2018, 7:30 AM

## 2018-06-16 ENCOUNTER — Inpatient Hospital Stay (HOSPITAL_COMMUNITY): Payer: BLUE CROSS/BLUE SHIELD

## 2018-06-16 LAB — CBC
HCT: 25.6 % — ABNORMAL LOW (ref 36.0–46.0)
Hemoglobin: 8.3 g/dL — ABNORMAL LOW (ref 12.0–15.0)
MCH: 28.5 pg (ref 26.0–34.0)
MCHC: 32.4 g/dL (ref 30.0–36.0)
MCV: 88 fL (ref 80.0–100.0)
Platelets: 21 10*3/uL — CL (ref 150–400)
RBC: 2.91 MIL/uL — ABNORMAL LOW (ref 3.87–5.11)
RDW: 16.8 % — ABNORMAL HIGH (ref 11.5–15.5)
WBC: 8.3 10*3/uL (ref 4.0–10.5)
nRBC: 0.6 % — ABNORMAL HIGH (ref 0.0–0.2)

## 2018-06-16 LAB — CULTURE, BLOOD (ROUTINE X 2)
Culture: NO GROWTH
Culture: NO GROWTH
Special Requests: ADEQUATE
Special Requests: ADEQUATE

## 2018-06-16 LAB — GLUCOSE, CAPILLARY
Glucose-Capillary: 127 mg/dL — ABNORMAL HIGH (ref 70–99)
Glucose-Capillary: 178 mg/dL — ABNORMAL HIGH (ref 70–99)
Glucose-Capillary: 94 mg/dL (ref 70–99)
Glucose-Capillary: 226 mg/dL — ABNORMAL HIGH (ref 70–99)

## 2018-06-16 LAB — BASIC METABOLIC PANEL
Anion gap: 10 (ref 5–15)
BUN: 13 mg/dL (ref 6–20)
CO2: 28 mmol/L (ref 22–32)
Calcium: 7.6 mg/dL — ABNORMAL LOW (ref 8.9–10.3)
Chloride: 98 mmol/L (ref 98–111)
Creatinine, Ser: 0.55 mg/dL (ref 0.44–1.00)
GFR calc Af Amer: >60 mL/min (ref >60)
GFR calc non Af Amer: >60 mL/min (ref >60)
Glucose, Bld: 158 mg/dL — ABNORMAL HIGH (ref 70–99)
Potassium: 3.5 mmol/L (ref 3.5–5.1)
Sodium: 136 mmol/L (ref 135–145)

## 2018-06-16 LAB — MAGNESIUM: Magnesium: 1.7 mg/dL (ref 1.7–2.4)

## 2018-06-16 LAB — PROCALCITONIN: Procalcitonin: 6.35 ng/mL

## 2018-06-16 LAB — STREP PNEUMONIAE URINARY ANTIGEN: Strep Pneumo Urinary Antigen: NEGATIVE

## 2018-06-16 MED ORDER — SODIUM CHLORIDE 0.9 % IV SOLN
INTRAVENOUS | Status: DC
  Administered 2018-06-16 – 2018-06-17 (×6): via INTRAVENOUS

## 2018-06-16 MED ORDER — PIPERACILLIN-TAZOBACTAM 3.375 G IVPB
3.3750 g | Freq: Three times a day (TID) | INTRAVENOUS | Status: DC
  Administered 2018-06-16 – 2018-06-20 (×13): 3.375 g via INTRAVENOUS
  Filled 2018-06-16 (×13): qty 50

## 2018-06-16 MED ORDER — MENTHOL 3 MG MT LOZG
1.0000 | LOZENGE | OROMUCOSAL | Status: DC | PRN
  Filled 2018-06-16: qty 9

## 2018-06-16 MED ORDER — ACYCLOVIR 5 % EX OINT
TOPICAL_OINTMENT | CUTANEOUS | Status: DC
  Administered 2018-06-16 – 2018-06-18 (×5): via TOPICAL
  Filled 2018-06-16: qty 15

## 2018-06-16 NOTE — Progress Notes (Signed)
Stacy Shelton   DOB:1960-07-01   PY#:[id number]    ASSESSMENT & PLAN:   Metastatic breast cancer She has received 2 cycles of chemotherapy so far with improved disease control I am not concerned about mild changes noted on her CT chest in regards to the lymph nodes and lung nodule.  Liver metastasis is shrinking Continue aggressive supportive care  SVT, hypotensive, resolving ECHO showed no evidence of cardiomyopathy.  The restrictive heart movement is likely due to consolidation of the lungs noted on her CT imaging She will continue her current prescription heart medicine  Acute renal failure, resolved This was secondary to recent nausea, vomiting and dehydration. 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 I encouraged her to eat as much as she can  Severe uncontrolled hyperglycemia, resolved This has resolved with insulin drip She will resume diet and insulin as needed  Tachypnea, abnormal CT chest, recurrent fever She has persistent bilateral atelectasis/pneumonia changes in her lung bases Cultures are negative She will continue oxygen therapy and antibiotics She will continue to work with physical therapy  Clinical depression, stable She has clinical signs of depression, verified and confirmed by family members I have started her on Remeron that should help with stabilizing her mood I will continue to check on her and provide emotional  support Once she is more stable, we will consult psychiatrist to follow  Code Status Full code  Goals of care Aggressive supportive care. Hopefully, she can be moved out of ICU once her cardiovascular instability resolves  Discharge planning I am hopeful she can be transition out of intensive care unit I will continue to check on her Her sister, [name] is updated  All questions were answered. The patient knows to call the clinic with any problems, questions or concerns.   [name], MD 06/16/2018 9:44 AM  Subjective:  She received blood transfusion yesterday.  Due to recurrent fever, her antibiotics were switched to Zosyn.  She felt hot and sweaty when she had fever but no chills.  She continues to have intermittent cough.  Her appetite is improved and able to eat better over the weekend.  She has some loose stool.  Denies nausea  Objective:  Vitals:   06/16/18 0600 06/16/18 0800  BP: (!) 118/43   Pulse: (!) 111   Resp: (!) 35   Temp:  (!) 101.8 F (38.8 C)  SpO2: 96%      Intake/Output Summary (Last 24 hours) at 06/16/2018 0944 Last data filed at 06/16/2018 0729 Gross per 24 hour  Intake 1043.56 ml  Output 1150 ml  Net -106.44 ml    GENERAL:alert, no distress and comfortable.  She seems a bit more cheerful today SKIN: skin color is pale, texture, turgor are normal, no rashes or significant lesions EYES: normal, Conjunctiva are pale and non-injected, sclera clear Musculoskeletal:no cyanosis of digits and no clubbing  NEURO: alert & oriented x 3 with fluent speech, no focal motor/sensory deficits   Labs:  Recent Labs    05/06/18 0430  06/12/18 0227 06/13/18 0146  06/14/18 0206 06/15/18 0500 06/16/18 0232  NA 138   < > 137 137   < > 138 139 136  K 3.6   < > 3.7 2.9*   < > 3.9 3.7 3.5  CL 99   < > 105 102   < > 101 102 98  CO2 32   < > 23 26   < > 27 28 28   GLUCOSE 138*   < > 85 165*   < > 85 76 158*  BUN 14   < > 41* 23*   < > 21* 17 13  CREATININE 0.54    < > 0.91 0.71   < > 0.68 0.63 0.55  CALCIUM 7.8*   < > 7.8* 7.7*   < > 7.9* 7.6* 7.6*  GFRNONAA >60   < > >60 >60   < > >60 >60 >60  GFRAA >60   < > >60 >60   < > >60 >60 >60  PROT 5.4*   < > 5.6* 5.6*  --  6.0*  --   --   ALBUMIN 1.7*   < > 1.9* 1.8*  --  1.9*  --   --   AST 23   < > 14* 16  --  26  --   --   ALT 14   < > 12 11  --  16  --   --   ALKPHOS 372*   < > 98 96  --  180*  --   --   BILITOT 2.0*   < > 1.6* 1.3*  --  1.6*  --   --   BILIDIR 1.0*  --   --   --   --   --   --   --   IBILI 1.0*  --   --   --   --   --   --   --    < > = values in this interval not displayed.    Studies:  Ct Angio Chest Pe W Or Wo Contrast  Result Date: 06/11/2018 CLINICAL DATA:  Shortness of breath. Metastatic breast cancer. EXAM: CT ANGIOGRAPHY CHEST WITH CONTRAST TECHNIQUE: Multidetector CT imaging of the chest was performed using the standard protocol during bolus administration of intravenous contrast. Multiplanar CT image reconstructions and MIPs were obtained to evaluate the vascular anatomy. CONTRAST:  41mL OMNIPAQUE IOHEXOL 350 MG/ML SOLN COMPARISON:  Chest radiograph 06/11/2018. Thoracic spine CT 05/19/2018. Chest CTA 04/27/2018. FINDINGS: Cardiovascular: Pulmonary arterial opacification is adequate without evidence of lobar more proximal emboli. No segmental emboli are identified although assessment is limited in some areas due to motion artifact. Mild thoracic aortic atherosclerosis is noted without aneurysm. The heart is enlarged. There is no pericardial effusion. A right jugular Port-A-Cath terminates in the high right atrium. Mediastinum/Nodes: Calcified subcarinal and right hilar lymph nodes are again noted. Mildly enlarged noncalcified lymph nodes including a 10 mm short axis right paratracheal lymph node, levin mm precarinal lymph node, an 11 mm right hilar lymph node are unchanged from the prior chest CTA. The esophagus and thyroid are unremarkable. Lungs/Pleura: There are small bilateral  pleural effusions, unchanged on the right and decreased in size on the left compared to the prior chest CTA. Extensive right lower lobe consolidation has greatly progressed from the prior chest CTA with progression also noted compared to the more recent thoracic spine CT. There is mildly improved aeration of the left lower lobe compared to the  prior chest CTA, however moderately extensive consolidation and volume loss remain. A 2.5 x 2.2 cm right upper lobe nodule is unchanged from the thoracic spine CT though has enlarged from the chest CTA (series 10, image 51), as has an 8 mm nodule more anterolaterally in the right upper lobe (series 10, image 50). Multiple subcentimeter nodules are present in the left upper lobe with some being larger compared to the prior chest CT and others being unchanged. Upper Abdomen: Liver lesions have decreased in size, for example a segment VII/VIII lesion now measures 16 mm (series 4, image 85, previously 23 mm). Calcified granulomas are noted in the spleen. Musculoskeletal: Unchanged subcentimeter sclerotic foci in the T11 and T12 vertebral bodies, left glenoid, and inferior right scapular body. Review of the MIP images confirms the above findings. IMPRESSION: 1. No evidence of pulmonary emboli. 2. Extensive bilateral lower lobe consolidation. 3. Small pleural effusions. 4. 2.5 cm right upper lobe nodule, enlarged from 04/27/2018 and which may reflect a metastasis or primary lung cancer. Stable to mild enlargement of subcentimeter nodules in both upper lobes consistent with known metastases. 5. Decreased size of liver metastases. 6. Unchanged mild mediastinal and right hilar lymphadenopathy. 7. Aortic Atherosclerosis (ICD10-I70.0). Electronically Signed   By: Logan Bores M.D.   On: 06/11/2018 17:02   Ct Thoracic Spine Wo Contrast  Result Date: 05/19/2018 CLINICAL DATA:  Fall last week. Back pain and bilateral legs since fall. EXAM: CT THORACIC SPINE WITHOUT CONTRAST TECHNIQUE:  Multidetector CT images of the thoracic were obtained using the standard protocol without intravenous contrast. COMPARISON:  CT chest 04/27/2018 FINDINGS: Alignment: Normal Vertebrae: Negative for fracture or metastatic disease. No lytic or sclerotic bone lesion identified. Paraspinal and other soft tissues: Extensive left lower lobe consolidation as noted previously. Progression of right lower lobe consolidation. Right upper lobe mass lesion approximately 2.4 cm is unchanged and may represent neoplasm. Calcified subcarinal lymph node unchanged. Disc levels: Disc spaces maintained without significant degenerative change or spinal stenosis. IMPRESSION: 1. Negative for fracture or metastatic disease thoracic spine 2. Bibasilar consolidation with progression on the right. Possible pneumonia or tumor. 2.4 cm right upper lobe spiculated mass may represent metastatic disease or bronchogenic carcinoma. Electronically Signed   By: Franchot Gallo M.D.   On: 05/19/2018 17:16   Ct Lumbar Spine Wo Contrast  Result Date: 05/19/2018 CLINICAL DATA:  Fall 1 week ago. Back pain. History of metastatic breast cancer. EXAM: CT LUMBAR SPINE WITHOUT CONTRAST TECHNIQUE: Multidetector CT imaging of the lumbar spine was performed without intravenous contrast administration. Multiplanar CT image reconstructions were also generated. COMPARISON:  CT abdomen pelvis 04/08/2018 FINDINGS: Segmentation: Normal Alignment: Normal Vertebrae: Negative for lumbar fracture. 3 mm sclerotic lesion L3 vertebral body. This has enlarged slightly from 03/11/2018 therefore is suspicious for metastatic disease. No other sclerotic or lytic lesions. Paraspinal and other soft tissues: Negative for paraspinous mass or adenopathy. Mild atherosclerotic aorta. Disc levels: Mild lumbar disc and facet degeneration. Negative for disc protrusion or stenosis. IMPRESSION: 1. Negative for lumbar fracture 2. 3 mm sclerotic lesion L4 vertebral body likely due to metastatic  disease. This has enlarged since 03/21/2018. Electronically Signed   By: Franchot Gallo M.D.   On: 05/19/2018 17:23   Ct Abdomen Pelvis W Contrast  Result Date: 05/19/2018 CLINICAL DATA:  Blunt abdominal trauma. EXAM: CT ABDOMEN AND PELVIS WITH CONTRAST TECHNIQUE: Multidetector CT imaging of the abdomen and pelvis was performed using the standard protocol following bolus administration of intravenous contrast. CONTRAST:  124mL  OMNIPAQUE IOHEXOL 300 MG/ML  SOLN COMPARISON:  CT scan of April 08, 2018. FINDINGS: Lower chest: Bilateral posterior basilar atelectasis or pneumonia is noted. Hepatobiliary: No gallstones or biliary dilatation is noted. Intrahepatic metastatic lesions noted on prior exam are significantly smaller. 1.9 cm lesion is seen in peripheral portion of right hepatic lobe which is decreased compared to prior exam where it measured 2.9 cm. 1.5 cm left hepatic lesion is noted which is significantly smaller compared to prior exam, where it measures 3.7 cm. Pancreas: Unremarkable. No pancreatic ductal dilatation or surrounding inflammatory changes. Spleen: Calcified splenic granulomata are noted. No other significant abnormality is noted. Adrenals/Urinary Tract: Adrenal glands are unremarkable. Kidneys are normal, without renal calculi, focal lesion, or hydronephrosis. Bladder is unremarkable. Stomach/Bowel: The stomach appears normal. There is no evidence of bowel obstruction or inflammation. The appendix is not clearly visualized. Vascular/Lymphatic: Aortic atherosclerosis. No enlarged abdominal or pelvic lymph nodes. Reproductive: Uterus and bilateral adnexa are unremarkable. Other: There is noted interval enlargement of the right iliacus muscle which is concerning for underlying hematoma. No hernia is noted. Musculoskeletal: No acute or significant osseous findings. IMPRESSION: Interval development of enlargement of right iliacus muscle with surrounding soft tissue stranding concerning for  underlying hematoma or possibly severe muscular injury. Hepatic metastatic lesions noted on prior exam is significantly smaller currently. Bilateral posterior basilar atelectasis or pneumonia is noted. Aortic Atherosclerosis (ICD10-I70.0). Electronically Signed   By: Marijo Conception M.D.   On: 05/19/2018 17:17   Dg Pelvis Portable  Result Date: 05/19/2018 CLINICAL DATA:  57 year old female with a fall a few years ago with bilateral hip pain EXAM: PORTABLE PELVIS 1-2 VIEWS COMPARISON:  None. FINDINGS: Bony pelvic ring intact, with no acute displaced fracture. Bilateral hips projects normally over the acetabula. Mild degenerative changes bilateral hips. Unremarkable proximal femurs. IMPRESSION: Negative for acute bony abnormality. Early bilateral hip osteoarthritis Electronically Signed   By: Corrie Mckusick D.O.   On: 05/19/2018 12:34   Dg Chest Port 1 View  Result Date: 06/16/2018 CLINICAL DATA:  Metastatic breast cancer pneumonia EXAM: PORTABLE CHEST 1 VIEW COMPARISON:  06/12/2018 FINDINGS: Right-sided Port-A-Cath with the tip projecting over the SVC. Small right pleural effusion. Bilateral lower lobe airspace disease most concerning for pneumonia. Persistent right upper lobe pulmonary nodule consistent malignancy. No pneumothorax. Stable cardiomediastinal silhouette. No aggressive osseous lesion. IMPRESSION: 1. Bilateral lower lobe airspace disease most consistent with pneumonia. Small right pleural effusion. 2. Persistent right upper lobe pulmonary nodule consistent with malignancy. Electronically Signed   By: Kathreen Devoid   On: 06/16/2018 08:49   Dg Chest Port 1 View  Result Date: 06/12/2018 CLINICAL DATA:  58 year old female with possible pneumonia in cancer patient. EXAM: PORTABLE CHEST 1 VIEW COMPARISON:  06/11/2018 and earlier. FINDINGS: Portable AP semi upright view at 0943 hours. Stable right chest power port, accessed. Increasing confluence of mid and lower lung opacity. Consolidation  demonstrated by CT yesterday. Trace pleural fluid more apparent by CT. No superimposed pneumothorax or pulmonary edema. Stable cardiac size and mediastinal contours. Visualized tracheal air column is within normal limits. IMPRESSION: Mild radiographic progression of bilateral consolidation/pneumonia since yesterday. Electronically Signed   By: Genevie Ann M.D.   On: 06/12/2018 10:37   Dg Chest Port 1 View  Result Date: 06/11/2018 CLINICAL DATA:  Hypotension. EXAM: PORTABLE CHEST 1 VIEW COMPARISON:  Radiograph of May 19, 2018. FINDINGS: Stable cardiomediastinal silhouette. Right internal jugular Port-A-Cath is unchanged in position. No pneumothorax is noted. Increased bibasilar opacities are noted concerning  for pneumonia or edema. Small pleural effusions may be present. Bony thorax is unremarkable. IMPRESSION: Increased bibasilar opacities are noted concerning for worsening pneumonia or edema. Small pleural effusions may be present. Electronically Signed   By: Marijo Conception M.D.   On: 06/11/2018 11:43   Dg Chest Port 1 View  Result Date: 05/19/2018 CLINICAL DATA:  Fall last Tuesday. Pain in both hips and legs. Fever. EXAM: PORTABLE CHEST 1 VIEW COMPARISON:  May 05, 2018 FINDINGS: The right Port-A-Cath terminates in the central SVC. There is mild opacity in left base, significantly improved since May 05, 2018. Most of the right-sided infiltrate is also resolved. Minimal opacity remains in the medial right upper lobe. No other interval changes. IMPRESSION: Mild opacities remain in the medial right upper lobe in the left base, significantly improved in the interval. The remainder of infiltrates previously seen have resolved. Recommend follow-up to complete resolution. Electronically Signed   By: Dorise Bullion III M.D   On: 05/19/2018 12:33

## 2018-06-16 NOTE — TOC Progression Note (Signed)
Transition of Care [facility]) - Progression Note    Patient Details  Name: Stacy Shelton MRN: [id number] Date of Birth: 21-Dec-1960  Transition of Care [facility]) CM/SW Contact  Joaquin Courts, RN Phone Number: 06/16/2018, 10:18 AM  Clinical Narrative:    CM spoke with pt regarding recommendation for SNF. Patient declines SNF, reports plans to return home with [facility]. Pt active with [facility], plan to resume services at discharge.     Expected Discharge Plan: [place] Barriers to Discharge: Continued Medical Work up  Expected Discharge Plan and Services Expected Discharge Plan: [place] arrangements for the past 2 months: Single Family Home Expected Discharge Date: (unknown)                                     Social Determinants of Health (SDOH) Interventions    Readmission Risk Interventions Readmission Risk Prevention Plan 06/13/2018 04/25/2018 04/25/2018  Transportation Screening Complete - Complete  Medication Review [employer]) Complete - Complete  PCP or Specialist appointment within 3-5 days of discharge Not Complete (No Data) Complete  PCP/Specialist Appt Not Complete comments not close to discharge - -  [place] or Home Care Consult Complete - Complete  SW Recovery Care/Counseling Consult Not Complete - Complete  SW Consult Not Complete Comments no evidence of need at this time - -  Palliative Care Screening Not Applicable - Not [place] Not Applicable - Not Applicable  Some recent data might be hidden

## 2018-06-16 NOTE — Progress Notes (Signed)
PROGRESS NOTE    Stacy Shelton  VHQ:[id number] DOB: 29-Dec-1960 DOA: 06/11/2018 PCP: Robyne Peers, MD   Brief Narrative:  Stacy Shelton is a pleasant 58 year old woman with history of metastatic breast cancer who is currently undergoing chemotherapy by Dr. Ernst Spell with last session on 05/28/2018 (Taxotere, Herceptin, Perjeta). she was feeling fine until 06/11/2018 when she started having nausea, vomiting and generalized weakness and went to see her oncologist and was found to be having anemia with hemoglobin of 6.2 and acute kidney injury with creatinine of 1.63 and was hypotensive and hyperglycemic so she was sent to the emergency department for further management.  She was initially admitted under hospitalist service but due to deterioration and requirement of vasopressors, she was transferred under PCCM/intensivist care.  She received total of 3 units of PRBC and vasopressors for some time.  She then he started having fever and was diagnosed with pneumonia and was started on IV Rocephin and Zithromax.  She also developed some SVT on 06/14/2018 for which cardiology was consulted.  She was given a dose of adenosine and she was then started on amiodarone.  Transthoracic echo did not show any acute pathology.  She was transferred under hospitalist service on 06/14/2018.  Her hemoglobin dropped again and she received 1 more unit of transfusion on 06/15/2018.  Consultants:   Cardiology, oncology, PCCM  Procedures:   None  Antimicrobials:   Rocephin and Zithromax which were stopped on 06/15/2018 and switched to Zosyn.   Subjective: Patient seen and examined.  She states that she feels much better today.  Denied any specific complaint.  Had fever once again this morning.  Objective: Vitals:   06/16/18 0900 06/16/18 1000 06/16/18 1100 06/16/18 1200  BP: (!) 107/35 (!) 110/53 (!) 110/40   Pulse: 97 95 87   Resp: (!) 34 17 (!) 36   Temp:    98.1 F (36.7 C)  TempSrc:    Oral  SpO2: 93% 96% 100%    Weight:      Height:        Intake/Output Summary (Last 24 hours) at 06/16/2018 1406 Last data filed at 06/16/2018 0729 Gross per 24 hour  Intake 1043.56 ml  Output 1150 ml  Net -106.44 ml   Filed Weights   06/14/18 0500 06/15/18 0500 06/16/18 0500  Weight: 60.6 kg 61 kg 60.7 kg    Examination:  General exam: Appears calm and comfortable  Respiratory system: Scant rhonchi bilaterally. Respiratory effort normal.  Has indwelling port at the right anterior chest Cardiovascular system: S1 & S2 heard, RRR. No JVD, murmurs, rubs, gallops or clicks. No pedal edema. Gastrointestinal system: Abdomen is nondistended, soft and nontender. No organomegaly or masses felt. Normal bowel sounds heard. Central nervous system: Alert and oriented. No focal neurological deficits. Extremities: Symmetric 5 x 5 power. Skin: No rashes, lesions or ulcers Psychiatry: Judgement and insight appear normal. Mood & affect appropriate.   Data Reviewed: I have personally reviewed following labs and imaging studies  CBC: Recent Labs  Lab 06/11/18 0905  06/12/18 0227 06/12/18 0806  06/13/18 0146  06/14/18 0206 06/14/18 0600 06/15/18 0500 06/15/18 1934 06/16/18 0232  WBC 15.3*   < > 9.6 11.1*  --  9.7  --  14.9*  --  8.3  --  8.3  NEUTROABS 10.2*  --  5.5  --   --  5.7  --  8.1*  --   --   --   --   HGB 6.2*   < >  7.8* 8.5*   < > 8.2*   < > 8.5* 7.9* 6.1* 8.0* 8.3*  HCT 20.0*   < > 23.5* 25.7*   < > 25.0*   < > 27.4* 24.6* 19.4* 24.8* 25.6*  MCV 86.2   < > 86.4 86.2  --  86.5  --  89.5  --  90.7  --  88.0  PLT 63*   < > 39* 41*  --  36*  --  36*  --  22*  --  21*   < > = values in this interval not displayed.   Basic Metabolic Panel: Recent Labs  Lab 06/11/18 2030  06/13/18 0146 06/13/18 1700 06/14/18 0206 06/15/18 0500 06/16/18 0232  NA  --    < > 137 136 138 139 136  K  --    < > 2.9* 2.9* 3.9 3.7 3.5  CL  --    < > 102 97* 101 102 98  CO2  --    < > 26 27 27 28 28   GLUCOSE  --    < > 165*  217* 85 76 158*  BUN  --    < > 23* 21* 21* 17 13  CREATININE 1.04*   < > 0.71 0.82 0.68 0.63 0.55  CALCIUM 8.2*   < > 7.7* 7.8* 7.9* 7.6* 7.6*  MG 2.1  --   --  2.2 1.9 1.8 1.7  PHOS 2.6  --   --   --   --   --   --    < > = values in this interval not displayed.   GFR: Estimated Creatinine Clearance: 65.7 mL/min (by C-G formula based on SCr of 0.55 mg/dL). Liver Function Tests: Recent Labs  Lab 06/11/18 0905 06/12/18 0227 06/13/18 0146 06/14/18 0206  AST 11* 14* 16 26  ALT 13 12 11 16   ALKPHOS 137* 98 96 180*  BILITOT 1.3* 1.6* 1.3* 1.6*  PROT 7.0 5.6* 5.6* 6.0*  ALBUMIN 2.1* 1.9* 1.8* 1.9*   No results for input(s): LIPASE, AMYLASE in the last 168 hours. No results for input(s): AMMONIA in the last 168 hours. Coagulation Profile: Recent Labs  Lab 06/12/18 0227  INR 1.5*   Cardiac Enzymes: No results for input(s): CKTOTAL, CKMB, CKMBINDEX, TROPONINI in the last 168 hours. BNP (last 3 results) No results for input(s): PROBNP in the last 8760 hours. HbA1C: No results for input(s): HGBA1C in the last 72 hours. CBG: Recent Labs  Lab 06/15/18 1142 06/15/18 1621 06/15/18 2147 06/16/18 0725 06/16/18 1110  GLUCAP 147* 169* 235* 127* 178*   Lipid Profile: No results for input(s): CHOL, HDL, LDLCALC, TRIG, CHOLHDL, LDLDIRECT in the last 72 hours. Thyroid Function Tests: No results for input(s): TSH, T4TOTAL, FREET4, T3FREE, THYROIDAB in the last 72 hours. Anemia Panel: No results for input(s): VITAMINB12, FOLATE, FERRITIN, TIBC, IRON, RETICCTPCT in the last 72 hours. Sepsis Labs: Recent Labs  Lab 06/11/18 1055 06/11/18 1640 06/12/18 0227 06/13/18 0146 06/16/18 0530  PROCALCITON  --  31.64 32.72 18.35 6.35  LATICACIDVEN 2.3* 2.6*  --   --   --     Recent Results (from the past 240 hour(s))  Blood Culture (routine x 2)     Status: None   Collection Time: 06/11/18 10:55 AM  Result Value Ref Range Status   Specimen Description   Final    BLOOD LEFT ARM  Performed at [facility], [place] [address], [place], [place] [address]    Special Requests  Final    BOTTLES DRAWN AEROBIC AND ANAEROBIC Blood Culture adequate volume Performed at [facility] [address], [place], [place] [address]    Culture   Final    NO GROWTH 5 DAYS Performed at [facility], [place] [address], [place], [place] [address]    Report Status 06/16/2018 FINAL  Final  Urine culture     Status: Abnormal   Collection Time: 06/11/18 10:55 AM  Result Value Ref Range Status   Specimen Description   Final    Urine Performed at [facility] [address] [place], [place] [address]    Special Requests   Final    Immunocompromised Performed at [facility], [place] [address], [place], [place] [address]    Culture (A)  Final    <10,000 COLONIES/mL INSIGNIFICANT GROWTH Performed at [facility] [address] [place], [place] [address]    Report Status 06/14/2018 FINAL  Final  Blood Culture (routine x 2)     Status: None   Collection Time: 06/11/18 11:00 AM  Result Value Ref Range Status   Specimen Description   Final    BLOOD CHEST RIGHT Performed at [facility] [address], [place], [place] [address]    Special Requests   Final    BOTTLES DRAWN AEROBIC AND ANAEROBIC Blood Culture adequate volume Performed at [facility] [address] [place], [place] [address]    Culture   Final    NO GROWTH 5 DAYS Performed at [facility], [place] [address] [place], [place] [address]    Report Status 06/16/2018 FINAL  Final  SARS Coronavirus 2 (CEPHEID - Performed in [place] hospital lab), Hosp Order     Status: None   Collection Time: 06/11/18 11:50 AM  Result Value Ref Range Status   SARS Coronavirus 2 NEGATIVE NEGATIVE Final    Comment: (NOTE) If result is NEGATIVE SARS-CoV-2 target nucleic acids are NOT DETECTED. The SARS-CoV-2 RNA is  generally detectable in upper and lower  respiratory specimens during the acute phase of infection. The lowest  concentration of SARS-CoV-2 viral copies this assay can detect is 250  copies / mL. A negative result does not preclude SARS-CoV-2 infection  and should not be used as the sole basis for treatment or other  patient management decisions.  A negative result may occur with  improper specimen collection / handling, submission of specimen other  than nasopharyngeal swab, presence of viral mutation(s) within the  areas targeted by this assay, and inadequate number of viral copies  (<250 copies / mL). A negative result must be combined with clinical  observations, patient history, and epidemiological information. If result is POSITIVE SARS-CoV-2 target nucleic acids are DETECTED. The SARS-CoV-2 RNA is generally detectable in upper and lower  respiratory specimens dur ing the acute phase of infection.  Positive  results are indicative of active infection with SARS-CoV-2.  Clinical  correlation with patient history and other diagnostic information is  necessary to determine patient infection status.  Positive results do  not rule out bacterial infection or co-infection with other viruses. If result is PRESUMPTIVE POSTIVE SARS-CoV-2 nucleic acids MAY BE PRESENT.   A presumptive positive result was obtained on the submitted specimen  and confirmed on repeat testing.  While 2019 novel coronavirus  (SARS-CoV-2) nucleic acids may be present in the submitted sample  additional confirmatory testing may be necessary for epidemiological  and /  or clinical management purposes  to differentiate between  SARS-CoV-2 and other Sarbecovirus currently known to infect humans.  If clinically indicated additional testing with an alternate test  methodology [phone]) is advised. The SARS-CoV-2 RNA is generally  detectable in upper and lower respiratory sp ecimens during the acute  phase of infection.  The expected result is Negative. Fact Sheet for Patients:  StrictlyIdeas.no Fact Sheet for Healthcare Providers: BankingDealers.co.za This test is not yet approved or cleared by the [place] FDA and has been authorized for detection and/or diagnosis of SARS-CoV-2 by FDA under an Emergency Use Authorization (EUA).  This EUA will remain in effect (meaning this test can be used) for the duration of the COVID-19 declaration under Section 564(b)(1) of the Act, 21 U.S.C. section 360bbb-3(b)(1), unless the authorization is terminated or revoked sooner. Performed at [facility], [place] [address], [place], [place] [address]   MRSA PCR Screening     Status: None   Collection Time: 06/11/18  3:57 PM  Result Value Ref Range Status   MRSA by PCR NEGATIVE NEGATIVE Final    Comment:        The GeneXpert MRSA Assay (FDA approved for NASAL specimens only), is one component of a comprehensive MRSA colonization surveillance program. It is not intended to diagnose MRSA infection nor to guide or monitor treatment for MRSA infections. Performed at [facility], [place] [address], [place], [place] [address]       Radiology Studies: Dg Chest Port 1 View  Result Date: 06/16/2018 CLINICAL DATA:  Metastatic breast cancer pneumonia EXAM: PORTABLE CHEST 1 VIEW COMPARISON:  06/12/2018 FINDINGS: Right-sided Port-A-Cath with the tip projecting over the SVC. Small right pleural effusion. Bilateral lower lobe airspace disease most concerning for pneumonia. Persistent right upper lobe pulmonary nodule consistent malignancy. No pneumothorax. Stable cardiomediastinal silhouette. No aggressive osseous lesion. IMPRESSION: 1. Bilateral lower lobe airspace disease most consistent with pneumonia. Small right pleural effusion. 2. Persistent right upper lobe pulmonary nodule consistent with malignancy. Electronically Signed   By: Kathreen Devoid   On: 06/16/2018 08:49    Scheduled Meds: . sodium chloride  250 mL Intravenous Once  . acyclovir ointment   Topical Q3H  . amiodarone  200 mg Oral Daily  . Chlorhexidine Gluconate Cloth  6 each Topical Daily  . docusate sodium  100 mg Oral BID  . feeding supplement (ENSURE ENLIVE)  237 mL Oral BID BM  . insulin aspart  0-15 Units Subcutaneous TID WC  . insulin aspart  8 Units Subcutaneous TID WC  . insulin detemir  10 Units Subcutaneous QHS  . mouth rinse  15 mL Mouth Rinse BID  . metoprolol tartrate  25 mg Oral BID  . mirtazapine  15 mg Oral QHS  . montelukast  10 mg Oral Daily  . multivitamin with minerals  1 tablet Oral Daily  . pantoprazole sodium  40 mg Oral Daily  . sodium chloride flush  10-40 mL Intracatheter Q12H   Continuous Infusions: . sodium chloride 250 mL/hr at 06/16/18 1328  . lactated ringers 10 mL/hr at 06/14/18 1200  . piperacillin-tazobactam 3.375 g (06/16/18 1019)     LOS: 5 days   Assessment & Plan:   Active Problems:   Thrombocytopenia ([place])   Uncontrolled diabetes mellitus with hyperglycemia ([place])   Metastatic breast cancer ([place])   Metastasis to brain ([place])   Metastasis to bone ([place])   Sepsis ([place])   Pneumonia due to infectious organism   Chronic  diastolic CHF (congestive heart failure) ([facility])   Anemia associated with chemotherapy   Chemotherapy-induced thrombocytopenia   SVT (supraventricular tachycardia) ([facility])   Cancer associated pain   Metastasis to lung ([facility])   Acute renal failure (ARF) (H[facility])   Severe anemia   Pressure injury of skin   Tachycardia   Acute diastolic heart failure ([facility])  Acute hypoxic respiratory failure Secondary to healthcare associated pneumonia: Due to being a cancer patient receiving active chemotherapy, she qualifies for healthcare associated pneumonia.  Switch to Zosyn from azithromycin and Rocephin.  She has been afebrile with T-max of 101.1 in last 24 hours.  Slightly tachycardic intermittently.  Has been  tested negative for COVID.  All the cultures are negative so far.  Will check for urine antigen and Legionella.  Procalcitonin improving.  Repeat chest x-ray today.  SVT: ?  Chemotherapy-induced cardiomyopathy.  Now in sinus rhythm.  Seen by cardiology.  Continues to be on oral metoprolol and amiodarone.  Monitor on telemetry.  Echo with no changes.  Acute on chronic anemia: Again likely due to chemotherapy.  Received total of 4 units of PRBC transfusion so far with the last being on 06/15/2018.  Hemoglobin above 7 now.  Oncology on board.  Thrombocytopenia: Again likely due to chemotherapy.  Platelets around 20.  No signs of bleeding.  Monitor daily.  Oncology on board.  Type 2 diabetes mellitus: Controlled.  Continue Lantus and sliding scale insulin.  Metastatic breast cancer: Management per heme-onc.  History of asthma: None at an acute exacerbation.  Continue Singulair.  DVT prophylaxis: SCD Code Status: Full code Family Communication: Discussed with patient Disposition Plan: Remains in ICU   Time spent: 30 minutes   Darliss Cheney, MD [facility] Pager [phone]  If 7PM-7AM, please contact night-coverage [email or web address] Password [email or web address] 06/16/2018, 2:06 PM

## 2018-06-17 ENCOUNTER — Inpatient Hospital Stay (HOSPITAL_COMMUNITY): Payer: BLUE CROSS/BLUE SHIELD

## 2018-06-17 LAB — CBC WITH DIFFERENTIAL/PLATELET
Abs Immature Granulocytes: 0.5 10*3/uL — ABNORMAL HIGH (ref 0.00–0.07)
Basophils Absolute: 0.7 10*3/uL — ABNORMAL HIGH (ref 0.0–0.1)
Basophils Relative: 7 %
Blasts: 2 %
Eosinophils Absolute: 0.1 10*3/uL (ref 0.0–0.5)
Eosinophils Relative: 1 %
HCT: 24.6 % — ABNORMAL LOW (ref 36.0–46.0)
Hemoglobin: 7.6 g/dL — ABNORMAL LOW (ref 12.0–15.0)
Lymphocytes Relative: 12 %
Lymphs Abs: 1.1 10*3/uL (ref 0.7–4.0)
MCH: 28.1 pg (ref 26.0–34.0)
MCHC: 30.9 g/dL (ref 30.0–36.0)
MCV: 91.1 fL (ref 80.0–100.0)
Monocytes Absolute: 0.5 10*3/uL (ref 0.1–1.0)
Monocytes Relative: 5 %
Myelocytes: 5 %
Neutro Abs: 6.4 10*3/uL (ref 1.7–7.7)
Neutrophils Relative %: 68 %
Platelets: 18 10*3/uL — CL (ref 150–400)
RBC: 2.7 MIL/uL — ABNORMAL LOW (ref 3.87–5.11)
RDW: 17.3 % — ABNORMAL HIGH (ref 11.5–15.5)
WBC: 9.4 10*3/uL (ref 4.0–10.5)
nRBC: 0.2 % (ref 0.0–0.2)

## 2018-06-17 LAB — GLUCOSE, CAPILLARY
Glucose-Capillary: 67 mg/dL — ABNORMAL LOW (ref 70–99)
Glucose-Capillary: 68 mg/dL — ABNORMAL LOW (ref 70–99)
Glucose-Capillary: 120 mg/dL — ABNORMAL HIGH (ref 70–99)
Glucose-Capillary: 163 mg/dL — ABNORMAL HIGH (ref 70–99)

## 2018-06-17 LAB — MAGNESIUM: Magnesium: 1.4 mg/dL — ABNORMAL LOW (ref 1.7–2.4)

## 2018-06-17 MED ORDER — MORPHINE SULFATE (PF) 2 MG/ML IV SOLN
2.0000 mg | INTRAVENOUS | Status: DC | PRN
  Administered 2018-06-17 – 2018-06-21 (×6): 2 mg via INTRAVENOUS
  Filled 2018-06-17 (×6): qty 1

## 2018-06-17 MED ORDER — FUROSEMIDE 10 MG/ML IJ SOLN
40.0000 mg | Freq: Once | INTRAMUSCULAR | Status: AC
  Administered 2018-06-17: 40 mg via INTRAVENOUS
  Filled 2018-06-17: qty 4

## 2018-06-17 MED ORDER — HYDROCOD POLST-CPM POLST ER 10-8 MG/5ML PO SUER
5.0000 mL | Freq: Two times a day (BID) | ORAL | Status: DC | PRN
  Administered 2018-06-17 – 2018-06-21 (×6): 5 mL via ORAL
  Filled 2018-06-17 (×6): qty 5

## 2018-06-17 MED ORDER — MAGNESIUM SULFATE 2 GM/50ML IV SOLN
2.0000 g | Freq: Once | INTRAVENOUS | Status: AC
  Administered 2018-06-17: 2 g via INTRAVENOUS
  Filled 2018-06-17: qty 50

## 2018-06-17 NOTE — Progress Notes (Signed)
PROGRESS NOTE    Stacy Shelton  WIO:[id number] DOB: Mar 19, 1960 DOA: 06/11/2018 PCP: Robyne Peers, MD   Brief Narrative:  Stacy Shelton is a pleasant 58 year old woman with history of metastatic breast cancer who is currently undergoing chemotherapy by Dr. Ernst Spell with last session on 05/28/2018 (Taxotere, Herceptin, Perjeta). she was feeling fine until 06/11/2018 when she started having nausea, vomiting and generalized weakness and went to see her oncologist and was found to be having anemia with hemoglobin of 6.2 and acute kidney injury with creatinine of 1.63 and was hypotensive and hyperglycemic so she was sent to the emergency department for further management.  She was initially admitted under hospitalist service but due to deterioration and requirement of vasopressors, she was transferred under PCCM/intensivist care.  She received total of 3 units of PRBC and vasopressors for some time.  She then he started having fever and was diagnosed with pneumonia and was started on IV Rocephin and Zithromax.  She also developed some SVT on 06/14/2018 for which cardiology was consulted.  She was given a dose of adenosine and she was then started on amiodarone.  Transthoracic echo did not show any acute pathology.  She was transferred under hospitalist service on 06/14/2018.  Her hemoglobin dropped again and she received 1 more unit of transfusion on 06/15/2018.  Patient has been having intermittent fever since then.  Due to being a cancer patient, she qualified for H CAP so I broadened her antibiotic coverage with Zosyn starting yesterday.  Consultants:   Cardiology, oncology, PCCM  Procedures:   None  Antimicrobials:   Rocephin and Zithromax which were stopped on 06/15/2018 and switched to Zosyn.   Subjective: Patient seen and examined.  She is on nonrebreather.  She states that she feels slightly worse today compared to yesterday.  Starting 2 AM, she started having some shortness of breath.  Nonrebreather  makes her feel better and she wants to keep it on for now.  Objective: Vitals:   06/17/18 0100 06/17/18 0400 06/17/18 0430 06/17/18 0720  BP: (!) 127/43 (!) 148/42    Pulse: 95 (!) 121    Resp: (!) 40 (!) 35    Temp:  (!) 100.6 F (38.1 C) 100.1 F (37.8 C)   TempSrc:  Oral    SpO2: 98% 92%  98%  Weight:      Height:        Intake/Output Summary (Last 24 hours) at 06/17/2018 0802 Last data filed at 06/17/2018 0308 Gross per 24 hour  Intake 4842.22 ml  Output --  Net 4842.22 ml   Filed Weights   06/14/18 0500 06/15/18 0500 06/16/18 0500  Weight: 60.6 kg 61 kg 60.7 kg    Examination:  General exam: Appears calm and comfortable, on nonrebreather Respiratory system: Bibasilar Rales. Respiratory effort normal. Cardiovascular system: S1 & S2 heard, RRR. No JVD, murmurs, rubs, gallops or clicks. No pedal edema.  Has Chemo-Port in the right anterior chest Gastrointestinal system: Abdomen is nondistended, soft and nontender. No organomegaly or masses felt. Normal bowel sounds heard. Central nervous system: Alert and oriented. No focal neurological deficits. Extremities: Symmetric 5 x 5 power. Skin: No rashes, lesions or ulcers Psychiatry: Judgement and insight appear normal. Mood & affect appropriate.   Data Reviewed: I have personally reviewed following labs and imaging studies  CBC: Recent Labs  Lab 06/11/18 0905  06/12/18 0227 06/12/18 0806  06/13/18 0146  06/14/18 0206 06/14/18 0600 06/15/18 0500 06/15/18 1934 06/16/18 0232  WBC 15.3*   < >  9.6 11.1*  --  9.7  --  14.9*  --  8.3  --  8.3  NEUTROABS 10.2*  --  5.5  --   --  5.7  --  8.1*  --   --   --   --   HGB 6.2*   < > 7.8* 8.5*   < > 8.2*   < > 8.5* 7.9* 6.1* 8.0* 8.3*  HCT 20.0*   < > 23.5* 25.7*   < > 25.0*   < > 27.4* 24.6* 19.4* 24.8* 25.6*  MCV 86.2   < > 86.4 86.2  --  86.5  --  89.5  --  90.7  --  88.0  PLT 63*   < > 39* 41*  --  36*  --  36*  --  22*  --  21*   < > = values in this interval not  displayed.   Basic Metabolic Panel: Recent Labs  Lab 06/11/18 2030  06/13/18 0146 06/13/18 1700 06/14/18 0206 06/15/18 0500 06/16/18 0232 06/17/18 0416  NA  --    < > 137 136 138 139 136  --   K  --    < > 2.9* 2.9* 3.9 3.7 3.5  --   CL  --    < > 102 97* 101 102 98  --   CO2  --    < > 26 27 27 28 28   --   GLUCOSE  --    < > 165* 217* 85 76 158*  --   BUN  --    < > 23* 21* 21* 17 13  --   CREATININE 1.04*   < > 0.71 0.82 0.68 0.63 0.55  --   CALCIUM 8.2*   < > 7.7* 7.8* 7.9* 7.6* 7.6*  --   MG 2.1  --   --  2.2 1.9 1.8 1.7 1.4*  PHOS 2.6  --   --   --   --   --   --   --    < > = values in this interval not displayed.   GFR: Estimated Creatinine Clearance: 65.7 mL/min (by C-G formula based on SCr of 0.55 mg/dL). Liver Function Tests: Recent Labs  Lab 06/11/18 0905 06/12/18 0227 06/13/18 0146 06/14/18 0206  AST 11* 14* 16 26  ALT 13 12 11 16   ALKPHOS 137* 98 96 180*  BILITOT 1.3* 1.6* 1.3* 1.6*  PROT 7.0 5.6* 5.6* 6.0*  ALBUMIN 2.1* 1.9* 1.8* 1.9*   No results for input(s): LIPASE, AMYLASE in the last 168 hours. No results for input(s): AMMONIA in the last 168 hours. Coagulation Profile: Recent Labs  Lab 06/12/18 0227  INR 1.5*   Cardiac Enzymes: No results for input(s): CKTOTAL, CKMB, CKMBINDEX, TROPONINI in the last 168 hours. BNP (last 3 results) No results for input(s): PROBNP in the last 8760 hours. HbA1C: No results for input(s): HGBA1C in the last 72 hours. CBG: Recent Labs  Lab 06/16/18 0725 06/16/18 1110 06/16/18 1547 06/16/18 2148 06/17/18 0730  GLUCAP 127* 178* 94 226* 67*   Lipid Profile: No results for input(s): CHOL, HDL, LDLCALC, TRIG, CHOLHDL, LDLDIRECT in the last 72 hours. Thyroid Function Tests: No results for input(s): TSH, T4TOTAL, FREET4, T3FREE, THYROIDAB in the last 72 hours. Anemia Panel: No results for input(s): VITAMINB12, FOLATE, FERRITIN, TIBC, IRON, RETICCTPCT in the last 72 hours. Sepsis Labs: Recent Labs  Lab  06/11/18 1055 06/11/18 1640 06/12/18 0227 06/13/18 0146 06/16/18 0530  PROCALCITON  --  31.64 32.72 18.35 6.35  LATICACIDVEN 2.3* 2.6*  --   --   --     Recent Results (from the past 240 hour(s))  Blood Culture (routine x 2)     Status: None   Collection Time: 06/11/18 10:55 AM  Result Value Ref Range Status   Specimen Description   Final    BLOOD LEFT ARM Performed at [facility], [address], [place], [place] [address]    Special Requests   Final    BOTTLES DRAWN AEROBIC AND ANAEROBIC Blood Culture adequate volume Performed at [facility] [address], [place], [place] [address]    Culture   Final    NO GROWTH 5 DAYS Performed at [facility], [place] [address] [place], [place] [address]    Report Status 06/16/2018 FINAL  Final  Urine culture     Status: Abnormal   Collection Time: 06/11/18 10:55 AM  Result Value Ref Range Status   Specimen Description   Final    Urine Performed at [facility] [address] [place], [place] [address]    Special Requests   Final    Immunocompromised Performed at [facility], [place] [address], [place], [place] [address]    Culture (A)  Final    <10,000 COLONIES/mL INSIGNIFICANT GROWTH Performed at [facility] [address], [place], [place] [address]    Report Status 06/14/2018 FINAL  Final  Blood Culture (routine x 2)     Status: None   Collection Time: 06/11/18 11:00 AM  Result Value Ref Range Status   Specimen Description   Final    BLOOD CHEST RIGHT Performed at [facility] [address], [place], [place] [address]    Special Requests   Final    BOTTLES DRAWN AEROBIC AND ANAEROBIC Blood Culture adequate volume Performed at [facility] [address] [place], [place] [address]    Culture   Final    NO GROWTH 5 DAYS Performed at [facility], [place] [address], [place], [place] [address]     Report Status 06/16/2018 FINAL  Final  SARS Coronavirus 2 (CEPHEID - Performed in [facility] hospital lab), Hosp Order     Status: None   Collection Time: 06/11/18 11:50 AM  Result Value Ref Range Status   SARS Coronavirus 2 NEGATIVE NEGATIVE Final    Comment: (NOTE) If result is NEGATIVE SARS-CoV-2 target nucleic acids are NOT DETECTED. The SARS-CoV-2 RNA is generally detectable in upper and lower  respiratory specimens during the acute phase of infection. The lowest  concentration of SARS-CoV-2 viral copies this assay can detect is 250  copies / mL. A negative result does not preclude SARS-CoV-2 infection  and should not be used as the sole basis for treatment or other  patient management decisions.  A negative result may occur with  improper specimen collection / handling, submission of specimen other  than nasopharyngeal swab, presence of viral mutation(s) within the  areas targeted by this assay, and inadequate number of viral copies  (<250 copies / mL). A negative result must be combined with clinical  observations, patient history, and epidemiological information. If result is POSITIVE SARS-CoV-2 target nucleic acids are DETECTED. The SARS-CoV-2 RNA is generally detectable in upper and lower  respiratory specimens dur ing the acute phase of infection.  Positive  results are indicative of active infection with SARS-CoV-2.  Clinical  correlation with  patient history and other diagnostic information is  necessary to determine patient infection status.  Positive results do  not rule out bacterial infection or co-infection with other viruses. If result is PRESUMPTIVE POSTIVE SARS-CoV-2 nucleic acids MAY BE PRESENT.   A presumptive positive result was obtained on the submitted specimen  and confirmed on repeat testing.  While 2019 novel coronavirus  (SARS-CoV-2) nucleic acids may be present in the submitted sample  additional confirmatory testing may be necessary for  epidemiological  and / or clinical management purposes  to differentiate between  SARS-CoV-2 and other Sarbecovirus currently known to infect humans.  If clinically indicated additional testing with an alternate test  methodology [phone]) is advised. The SARS-CoV-2 RNA is generally  detectable in upper and lower respiratory sp ecimens during the acute  phase of infection. The expected result is Negative. Fact Sheet for Patients:  StrictlyIdeas.no Fact Sheet for Healthcare Providers: BankingDealers.co.za This test is not yet approved or cleared by the [place] FDA and has been authorized for detection and/or diagnosis of SARS-CoV-2 by FDA under an Emergency Use Authorization (EUA).  This EUA will remain in effect (meaning this test can be used) for the duration of the COVID-19 declaration under Section 564(b)(1) of the Act, 21 U.S.C. section 360bbb-3(b)(1), unless the authorization is terminated or revoked sooner. Performed at [facility], [place] [address], [place], [place] [address]   MRSA PCR Screening     Status: None   Collection Time: 06/11/18  3:57 PM  Result Value Ref Range Status   MRSA by PCR NEGATIVE NEGATIVE Final    Comment:        The GeneXpert MRSA Assay (FDA approved for NASAL specimens only), is one component of a comprehensive MRSA colonization surveillance program. It is not intended to diagnose MRSA infection nor to guide or monitor treatment for MRSA infections. Performed at [facility], [place] [address] [place], [place] [address]       Radiology Studies: Dg Chest Port 1 View  Result Date: 06/16/2018 CLINICAL DATA:  Metastatic breast cancer pneumonia EXAM: PORTABLE CHEST 1 VIEW COMPARISON:  06/12/2018 FINDINGS: Right-sided Port-A-Cath with the tip projecting over the SVC. Small right pleural effusion. Bilateral lower lobe airspace disease most concerning for  pneumonia. Persistent right upper lobe pulmonary nodule consistent malignancy. No pneumothorax. Stable cardiomediastinal silhouette. No aggressive osseous lesion. IMPRESSION: 1. Bilateral lower lobe airspace disease most consistent with pneumonia. Small right pleural effusion. 2. Persistent right upper lobe pulmonary nodule consistent with malignancy. Electronically Signed   By: Kathreen Devoid   On: 06/16/2018 08:49    Scheduled Meds:  sodium chloride  250 mL Intravenous Once   acyclovir ointment   Topical Q3H   amiodarone  200 mg Oral Daily   Chlorhexidine Gluconate Cloth  6 each Topical Daily   docusate sodium  100 mg Oral BID   feeding supplement (ENSURE ENLIVE)  237 mL Oral BID BM   insulin aspart  0-15 Units Subcutaneous TID WC   insulin aspart  8 Units Subcutaneous TID WC   insulin detemir  10 Units Subcutaneous QHS   mouth rinse  15 mL Mouth Rinse BID   metoprolol tartrate  25 mg Oral BID   mirtazapine  15 mg Oral QHS   montelukast  10 mg Oral Daily   multivitamin with minerals  1 tablet Oral Daily   pantoprazole sodium  40 mg Oral Daily   sodium chloride flush  10-40 mL Intracatheter Q12H  Continuous Infusions:  sodium chloride 250 mL/hr at 06/17/18 7116   lactated ringers 10 mL/hr at 06/14/18 1200   magnesium sulfate bolus IVPB     piperacillin-tazobactam Stopped (06/17/18 0503)     LOS: 6 days   Assessment & Plan:   Active Problems:   Thrombocytopenia ([place])   Uncontrolled diabetes mellitus with hyperglycemia (HCC)   Metastatic breast cancer (HCC)   Metastasis to brain ([place])   Metastasis to bone ([place])   Sepsis ([place])   Pneumonia due to infectious organism   Chronic diastolic CHF (congestive heart failure) (HCC)   Anemia associated with chemotherapy   Chemotherapy-induced thrombocytopenia   SVT (supraventricular tachycardia) (HCC)   Cancer associated pain   Metastasis to lung (HCC)   Acute renal failure (ARF) (HCC)   Severe anemia   Pressure  injury of skin   Tachycardia   Acute diastolic heart failure (HCC)  Acute hypoxic respiratory failure Secondary to healthcare associated pneumonia: Due to being a cancer patient receiving active chemotherapy, she qualifies for healthcare associated pneumonia.  Still having intermittent fever.  All cultures remain negative.  Continue Zosyn for at least 1-2 more days before considering switching to something else if she has persistent fever.  Has been tested negative for COVID.  Urine antigen for Legionella and streptococci are negative procalcitonin improving.   SVT: ?  Chemotherapy-induced cardiomyopathy.  Continues to be in sinus rhythm.  Seen by cardiology.  Continues to be on oral metoprolol and amiodarone.  Monitor on telemetry.  Echo with no changes.  Acute on chronic anemia: Again likely due to chemotherapy.  Received total of 4 units of PRBC transfusion so far with the last being on 06/15/2018.  Hemoglobin above 7 now.  Oncology on board.  Thrombocytopenia: Again likely due to chemotherapy.  Platelets around 20.  No signs of bleeding.  Monitor daily.  Oncology on board.  Type 2 diabetes mellitus: Controlled.  Continue Lantus and sliding scale insulin.  Metastatic breast cancer: Management per heme-onc.  History of asthma: None at an acute exacerbation.  Continue Singulair.  DVT prophylaxis: SCD Code Status: Full code Family Communication: Discussed with patient, no family members present. Disposition Plan: Remains in ICU   Time spent: 28 minutes   Darliss Cheney, MD [facility] Pager [phone]  If 7PM-7AM, please contact night-coverage [email or web address] Password [email or web address] 06/17/2018, 8:02 AM

## 2018-06-17 NOTE — Progress Notes (Signed)
Inpatient Diabetes Program Recommendations  AACE/ADA: New Consensus Statement on Inpatient Glycemic Control (2015)  Target Ranges:  Prepandial:   less than 140 mg/dL      Peak postprandial:   less than 180 mg/dL (1-2 hours)      Critically ill patients:  140 - 180 mg/dL   Results for Stacy Shelton, Stacy Shelton (MRN [id number]) as of 06/17/2018 08:31  Ref. Range 06/16/2018 07:25 06/16/2018 11:10 06/16/2018 15:47 06/16/2018 21:48  Glucose-Capillary Latest Ref Range: 70 - 99 mg/dL 127 (H) 178 (H)   3 units NOVOLOG  94 226 (H)   10 units LANTUS   Results for Stacy Shelton, Stacy Shelton (MRN [id number]) as of 06/17/2018 08:31  Ref. Range 06/17/2018 07:30  Glucose-Capillary Latest Ref Range: 70 - 99 mg/dL 67 (L)    Home DM Meds: Levemir 10 units QHS       Novolog 8 units TID with meals       Metformin 500 mg Daily   Current Orders: Levemir 10 units QHS      Novolog Moderate Correction Scale/ SSI (0-15 units) TID AC       Novolog 8 units TID with meals      MD- Patient with Hypoglycemia this AM after getting Levemir 10 units QHS.  Please consider the following:  1. Reduce Levemir to 5 units QHS (50% home dose)   2. Stop Novolog 8 units TID with meals     --Will follow patient during hospitalization--  Wyn Quaker RN, MSN, CDE Diabetes Coordinator Inpatient Glycemic Control Team Team Pager: [phone] (8a-5p)

## 2018-06-17 NOTE — Progress Notes (Signed)
PT Cancellation Note  Patient Details Name: Stacy Shelton MRN: [id number] DOB: 1961-01-08   Cancelled Treatment:    Reason Eval/Treat Not Completed: Pt refused PT. Will check back anothe day.    Weston Anna, PT Acute Rehabilitation Services Pager: [phone] Office: [phone]

## 2018-06-17 NOTE — Progress Notes (Signed)
Stacy Shelton   DOB:[birth year]   VQ#:[id number]    ASSESSMENT & PLAN:  Metastatic breast cancer She has received 2 cycles of chemotherapy so far with improved disease control I am not concerned about mild changes noted on her CT chest in regards to the lymph nodes and lung nodule.  Liver metastasis is shrinking Continue aggressive supportive care  SVT, hypotensive, resolving ECHO showed no evidence of cardiomyopathy.  The restrictive heart movement is likely due to consolidation of the lungs noted on her CT imaging She will continue her current heart medicine  Acute renal failure, resolved This was secondary to recent nausea, vomiting and dehydration. Observe closely for now  Chronic pancytopenia She had acute on chronic pancytopenia due to recent bone marrow suppression from chemotherapy She has received 2 units of blood transfusionon 06/11/2018 and 1 unit on 06/15/2018 She does not need platelet transfusion unless signs of bleeding or platelet count less than 10,000 Continue close monitoring daily. 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  Her fever is trending down.  Chest x-ray is reviewed which show possibly mild congestion/overload.  She is receiving Lasix for diuresis and a touch of morphine for anxiety She will continue to work with physical  therapy  Clinical depression, stable She has clinical signs of depression, verified and confirmed by family members I have started her on Remeron that should help with stabilizing her mood I will continue to check on her and provide emotional support Once she is more stable, we will consult psychiatrist to follow  Code Status Full code  Goals of care Aggressive supportive care. Hopefully, she can be moved out of ICU once her cardiovascular instability resolves  Discharge planning I am hopeful she can be transition out of intensive care unit I will continue to check on her Her sister, [name] is updated. I spent over 40 minutes with patient and updating her family  All questions were answered. The patient knows to call the clinic with any problems, questions or concerns.   [name], MD 06/17/2018 1:14 PM  Subjective:  The patient appears anxious.  She is receiving more oxygen therapy through facemask due to sensation of shortness of breath.  She had mild fever yesterday.  Her chest x-ray is reviewed which show minimum congestion but overall stable. The patient denies any recent signs or symptoms of bleeding such as spontaneous epistaxis, hematuria or hematochezia.   Objective:  Vitals:   06/17/18 1100 06/17/18 1200  BP: (!) 131/40 (!) 134/55  Pulse: 98 100  Resp: (!) 41 (!) 47  Temp:  98.3 F (36.8 C)  SpO2: 94% 94%     Intake/Output Summary (Last 24 hours) at 06/17/2018 1314 Last data filed at 06/17/2018 0308 Gross per 24 hour  Intake 4592.22 ml  Output -  Net 4592.22 ml    GENERAL:alert, no distress and comfortable.  She appears anxious SKIN: skin color, texture, turgor are normal, no rashes  or significant lesi Musculoskeletal:no cyanosis of digits and no clubbing  NEURO: alert & oriented x 3 with fluent speech, no focal motor/sensory deficits   Labs:  Recent Labs    05/06/18 0430  06/12/18 0227 06/13/18 0146  06/14/18 0206 06/15/18 0500 06/16/18 0232   NA 138   < > 137 137   < > 138 139 136  K 3.6   < > 3.7 2.9*   < > 3.9 3.7 3.5  CL 99   < > 105 102   < > 101 102 98  CO2 32   < > 23 26   < > 27 28 28   GLUCOSE 138*   < > 85 165*   < > 85 76 158*  BUN 14   < > 41* 23*   < > 21* 17 13  CREATININE 0.54   < > 0.91 0.71   < > 0.68 0.63 0.55  CALCIUM 7.8*   < > 7.8* 7.7*   < > 7.9* 7.6* 7.6*  GFRNONAA >60   < > >60 >60   < > >60 >60 >60  GFRAA >60   < > >60 >60   < > >60 >60 >60  PROT 5.4*   < > 5.6* 5.6*  --  6.0*  --   --   ALBUMIN 1.7*   < > 1.9* 1.8*  --  1.9*  --   --   AST 23   < > 14* 16  --  26  --   --   ALT 14   < > 12 11  --  16  --   --   ALKPHOS 372*   < > 98 96  --  180*  --   --   BILITOT 2.0*   < > 1.6* 1.3*  --  1.6*  --   --   BILIDIR 1.0*  --   --   --   --   --   --   --   IBILI 1.0*  --   --   --   --   --   --   --    < > = values in this interval not displayed.    Studies:  Ct Angio Chest Pe W Or Wo Contrast  Result Date: 06/11/2018 CLINICAL DATA:  Shortness of breath. Metastatic breast cancer. EXAM: CT ANGIOGRAPHY CHEST WITH CONTRAST TECHNIQUE: Multidetector CT imaging of the chest was performed using the standard protocol during bolus administration of intravenous contrast. Multiplanar CT image reconstructions and MIPs were obtained to evaluate the vascular anatomy. CONTRAST:  85mL OMNIPAQUE IOHEXOL 350 MG/ML SOLN COMPARISON:  Chest radiograph 06/11/2018. Thoracic spine CT 05/19/2018. Chest CTA 04/27/2018. FINDINGS: Cardiovascular: Pulmonary arterial opacification is adequate without evidence of lobar more proximal emboli. No segmental emboli are identified although assessment is limited in some areas due to motion artifact. Mild thoracic aortic atherosclerosis is noted without aneurysm. The heart is enlarged. There is no pericardial effusion. A right jugular Port-A-Cath terminates in the high right atrium. Mediastinum/Nodes: Calcified subcarinal and right hilar lymph nodes are again noted. Mildly enlarged noncalcified  lymph nodes including a 10 mm short axis right paratracheal lymph node, levin mm precarinal lymph node, an 11 mm right hilar lymph node are unchanged from the prior chest CTA. The esophagus and thyroid are unremarkable. Lungs/Pleura: There are small bilateral pleural effusions, unchanged on the right and decreased in size on the left compared to the prior chest CTA. Extensive right  lower lobe consolidation has greatly progressed from the prior chest CTA with progression also noted compared to the more recent thoracic spine CT. There is mildly improved aeration of the left lower lobe compared to the prior chest CTA, however moderately extensive consolidation and volume loss remain. A 2.5 x 2.2 cm right upper lobe nodule is unchanged from the thoracic spine CT though has enlarged from the chest CTA (series 10, image 51), as has an 8 mm nodule more anterolaterally in the right upper lobe (series 10, image 50). Multiple subcentimeter nodules are present in the left upper lobe with some being larger compared to the prior chest CT and others being unchanged. Upper Abdomen: Liver lesions have decreased in size, for example a segment VII/VIII lesion now measures 16 mm (series 4, image 85, previously 23 mm). Calcified granulomas are noted in the spleen. Musculoskeletal: Unchanged subcentimeter sclerotic foci in the T11 and T12 vertebral bodies, left glenoid, and inferior right scapular body. Review of the MIP images confirms the above findings. IMPRESSION: 1. No evidence of pulmonary emboli. 2. Extensive bilateral lower lobe consolidation. 3. Small pleural effusions. 4. 2.5 cm right upper lobe nodule, enlarged from 04/27/2018 and which may reflect a metastasis or primary lung cancer. Stable to mild enlargement of subcentimeter nodules in both upper lobes consistent with known metastases. 5. Decreased size of liver metastases. 6. Unchanged mild mediastinal and right hilar lymphadenopathy. 7. Aortic Atherosclerosis  (ICD10-I70.0). Electronically Signed   By: Logan Bores M.D.   On: 06/11/2018 17:02   Ct Thoracic Spine Wo Contrast  Result Date: 05/19/2018 CLINICAL DATA:  Fall last week. Back pain and bilateral legs since fall. EXAM: CT THORACIC SPINE WITHOUT CONTRAST TECHNIQUE: Multidetector CT images of the thoracic were obtained using the standard protocol without intravenous contrast. COMPARISON:  CT chest 04/27/2018 FINDINGS: Alignment: Normal Vertebrae: Negative for fracture or metastatic disease. No lytic or sclerotic bone lesion identified. Paraspinal and other soft tissues: Extensive left lower lobe consolidation as noted previously. Progression of right lower lobe consolidation. Right upper lobe mass lesion approximately 2.4 cm is unchanged and may represent neoplasm. Calcified subcarinal lymph node unchanged. Disc levels: Disc spaces maintained without significant degenerative change or spinal stenosis. IMPRESSION: 1. Negative for fracture or metastatic disease thoracic spine 2. Bibasilar consolidation with progression on the right. Possible pneumonia or tumor. 2.4 cm right upper lobe spiculated mass may represent metastatic disease or bronchogenic carcinoma. Electronically Signed   By: Franchot Gallo M.D.   On: 05/19/2018 17:16   Ct Lumbar Spine Wo Contrast  Result Date: 05/19/2018 CLINICAL DATA:  Fall 1 week ago. Back pain. History of metastatic breast cancer. EXAM: CT LUMBAR SPINE WITHOUT CONTRAST TECHNIQUE: Multidetector CT imaging of the lumbar spine was performed without intravenous contrast administration. Multiplanar CT image reconstructions were also generated. COMPARISON:  CT abdomen pelvis 04/08/2018 FINDINGS: Segmentation: Normal Alignment: Normal Vertebrae: Negative for lumbar fracture. 3 mm sclerotic lesion L3 vertebral body. This has enlarged slightly from 03/11/2018 therefore is suspicious for metastatic disease. No other sclerotic or lytic lesions. Paraspinal and other soft tissues: Negative  for paraspinous mass or adenopathy. Mild atherosclerotic aorta. Disc levels: Mild lumbar disc and facet degeneration. Negative for disc protrusion or stenosis. IMPRESSION: 1. Negative for lumbar fracture 2. 3 mm sclerotic lesion L4 vertebral body likely due to metastatic disease. This has enlarged since 03/21/2018. Electronically Signed   By: Franchot Gallo M.D.   On: 05/19/2018 17:23   Ct Abdomen Pelvis W Contrast  Result Date: 05/19/2018 CLINICAL  DATA:  Blunt abdominal trauma. EXAM: CT ABDOMEN AND PELVIS WITH CONTRAST TECHNIQUE: Multidetector CT imaging of the abdomen and pelvis was performed using the standard protocol following bolus administration of intravenous contrast. CONTRAST:  159mL OMNIPAQUE IOHEXOL 300 MG/ML  SOLN COMPARISON:  CT scan of April 08, 2018. FINDINGS: Lower chest: Bilateral posterior basilar atelectasis or pneumonia is noted. Hepatobiliary: No gallstones or biliary dilatation is noted. Intrahepatic metastatic lesions noted on prior exam are significantly smaller. 1.9 cm lesion is seen in peripheral portion of right hepatic lobe which is decreased compared to prior exam where it measured 2.9 cm. 1.5 cm left hepatic lesion is noted which is significantly smaller compared to prior exam, where it measures 3.7 cm. Pancreas: Unremarkable. No pancreatic ductal dilatation or surrounding inflammatory changes. Spleen: Calcified splenic granulomata are noted. No other significant abnormality is noted. Adrenals/Urinary Tract: Adrenal glands are unremarkable. Kidneys are normal, without renal calculi, focal lesion, or hydronephrosis. Bladder is unremarkable. Stomach/Bowel: The stomach appears normal. There is no evidence of bowel obstruction or inflammation. The appendix is not clearly visualized. Vascular/Lymphatic: Aortic atherosclerosis. No enlarged abdominal or pelvic lymph nodes. Reproductive: Uterus and bilateral adnexa are unremarkable. Other: There is noted interval enlargement of the right  iliacus muscle which is concerning for underlying hematoma. No hernia is noted. Musculoskeletal: No acute or significant osseous findings. IMPRESSION: Interval development of enlargement of right iliacus muscle with surrounding soft tissue stranding concerning for underlying hematoma or possibly severe muscular injury. Hepatic metastatic lesions noted on prior exam is significantly smaller currently. Bilateral posterior basilar atelectasis or pneumonia is noted. Aortic Atherosclerosis (ICD10-I70.0). Electronically Signed   By: Marijo Conception M.D.   On: 05/19/2018 17:17   Dg Pelvis Portable  Result Date: 05/19/2018 CLINICAL DATA:  58 year old female with a fall a few years ago with bilateral hip pain EXAM: PORTABLE PELVIS 1-2 VIEWS COMPARISON:  None. FINDINGS: Bony pelvic ring intact, with no acute displaced fracture. Bilateral hips projects normally over the acetabula. Mild degenerative changes bilateral hips. Unremarkable proximal femurs. IMPRESSION: Negative for acute bony abnormality. Early bilateral hip osteoarthritis Electronically Signed   By: Corrie Mckusick D.O.   On: 05/19/2018 12:34   Dg Chest Port 1 View  Result Date: 06/17/2018 CLINICAL DATA:  58 year old female with respiratory distress EXAM: PORTABLE CHEST 1 VIEW COMPARISON:  06/16/2018, 06/12/2018, CT 06/11/2018 FINDINGS: Cardiomediastinal silhouette likely unchanged in size and contour. Heart borders partially obscured by overlying lung and pleural disease. Opacities at the bilateral lung bases obscuring the hemidiaphragms and heart borders, appears unchanged from prior. Graded opacities towards the bases. Right hilar opacity persists. Partially calcified nodes at the right hilum. No pneumothorax. Right IJ port catheter. No displaced fracture IMPRESSION: Similar appearance of the chest x-ray, with bilateral right greater than left pleural effusions and associated atelectasis/consolidation. Right hilar opacities better seen on recent CT  imaging. Unchanged right IJ port catheter. Electronically Signed   By: Corrie Mckusick D.O.   On: 06/17/2018 08:05   Dg Chest Port 1 View  Result Date: 06/16/2018 CLINICAL DATA:  Metastatic breast cancer pneumonia EXAM: PORTABLE CHEST 1 VIEW COMPARISON:  06/12/2018 FINDINGS: Right-sided Port-A-Cath with the tip projecting over the SVC. Small right pleural effusion. Bilateral lower lobe airspace disease most concerning for pneumonia. Persistent right upper lobe pulmonary nodule consistent malignancy. No pneumothorax. Stable cardiomediastinal silhouette. No aggressive osseous lesion. IMPRESSION: 1. Bilateral lower lobe airspace disease most consistent with pneumonia. Small right pleural effusion. 2. Persistent right upper lobe pulmonary nodule consistent with malignancy.  Electronically Signed   By: Kathreen Devoid   On: 06/16/2018 08:49   Dg Chest Port 1 View  Result Date: 06/12/2018 CLINICAL DATA:  58 year old female with possible pneumonia in cancer patient. EXAM: PORTABLE CHEST 1 VIEW COMPARISON:  06/11/2018 and earlier. FINDINGS: Portable AP semi upright view at 0943 hours. Stable right chest power port, accessed. Increasing confluence of mid and lower lung opacity. Consolidation demonstrated by CT yesterday. Trace pleural fluid more apparent by CT. No superimposed pneumothorax or pulmonary edema. Stable cardiac size and mediastinal contours. Visualized tracheal air column is within normal limits. IMPRESSION: Mild radiographic progression of bilateral consolidation/pneumonia since yesterday. Electronically Signed   By: Genevie Ann M.D.   On: 06/12/2018 10:37   Dg Chest Port 1 View  Result Date: 06/11/2018 CLINICAL DATA:  Hypotension. EXAM: PORTABLE CHEST 1 VIEW COMPARISON:  Radiograph of May 19, 2018. FINDINGS: Stable cardiomediastinal silhouette. Right internal jugular Port-A-Cath is unchanged in position. No pneumothorax is noted. Increased bibasilar opacities are noted concerning for pneumonia or edema.  Small pleural effusions may be present. Bony thorax is unremarkable. IMPRESSION: Increased bibasilar opacities are noted concerning for worsening pneumonia or edema. Small pleural effusions may be present. Electronically Signed   By: Marijo Conception M.D.   On: 06/11/2018 11:43   Dg Chest Port 1 View  Result Date: 05/19/2018 CLINICAL DATA:  Fall last Tuesday. Pain in both hips and legs. Fever. EXAM: PORTABLE CHEST 1 VIEW COMPARISON:  May 05, 2018 FINDINGS: The right Port-A-Cath terminates in the central SVC. There is mild opacity in left base, significantly improved since May 05, 2018. Most of the right-sided infiltrate is also resolved. Minimal opacity remains in the medial right upper lobe. No other interval changes. IMPRESSION: Mild opacities remain in the medial right upper lobe in the left base, significantly improved in the interval. The remainder of infiltrates previously seen have resolved. Recommend follow-up to complete resolution. Electronically Signed   By: Dorise Bullion III M.D   On: 05/19/2018 12:33

## 2018-06-17 NOTE — Progress Notes (Signed)
Pt received Morphine and Lasix IV d/t pt stating that she feels like she can't breathe. Ordered by MD Einar Grad after I notified him of situation.

## 2018-06-18 ENCOUNTER — Other Ambulatory Visit: Payer: BLUE CROSS/BLUE SHIELD

## 2018-06-18 ENCOUNTER — Ambulatory Visit: Payer: BLUE CROSS/BLUE SHIELD | Admitting: Hematology and Oncology

## 2018-06-18 ENCOUNTER — Inpatient Hospital Stay: Payer: BLUE CROSS/BLUE SHIELD

## 2018-06-18 DIAGNOSIS — D696 Thrombocytopenia, unspecified: Secondary | ICD-10-CM

## 2018-06-18 DIAGNOSIS — C7801 Secondary malignant neoplasm of right lung: Secondary | ICD-10-CM

## 2018-06-18 DIAGNOSIS — E1165 Type 2 diabetes mellitus with hyperglycemia: Secondary | ICD-10-CM

## 2018-06-18 LAB — CBC WITH DIFFERENTIAL/PLATELET
Abs Immature Granulocytes: 0.6 10*3/uL — ABNORMAL HIGH (ref 0.00–0.07)
Basophils Absolute: 0.3 10*3/uL — ABNORMAL HIGH (ref 0.0–0.1)
Basophils Relative: 3 %
Blasts: 1 %
Eosinophils Absolute: 0.3 10*3/uL (ref 0.0–0.5)
Eosinophils Relative: 3 %
HCT: 23.8 % — ABNORMAL LOW (ref 36.0–46.0)
Hemoglobin: 7.3 g/dL — ABNORMAL LOW (ref 12.0–15.0)
Lymphocytes Relative: 11 %
Lymphs Abs: 1 10*3/uL (ref 0.7–4.0)
MCH: 27.9 pg (ref 26.0–34.0)
MCHC: 30.7 g/dL (ref 30.0–36.0)
MCV: 90.8 fL (ref 80.0–100.0)
Metamyelocytes Relative: 2 %
Monocytes Absolute: 0.6 10*3/uL (ref 0.1–1.0)
Monocytes Relative: 7 %
Myelocytes: 5 %
Neutro Abs: 6.3 10*3/uL (ref 1.7–7.7)
Neutrophils Relative %: 68 %
Platelets: 13 10*3/uL — CL (ref 150–400)
RBC: 2.62 MIL/uL — ABNORMAL LOW (ref 3.87–5.11)
RDW: 16.9 % — ABNORMAL HIGH (ref 11.5–15.5)
WBC: 9.2 10*3/uL (ref 4.0–10.5)
nRBC: 0.2 % (ref 0.0–0.2)

## 2018-06-18 LAB — COMPREHENSIVE METABOLIC PANEL
ALT: 12 U/L (ref 0–44)
AST: 16 U/L (ref 15–41)
Albumin: 1.3 g/dL — ABNORMAL LOW (ref 3.5–5.0)
Alkaline Phosphatase: 156 U/L — ABNORMAL HIGH (ref 38–126)
Anion gap: 9 (ref 5–15)
BUN: 10 mg/dL (ref 6–20)
CO2: 29 mmol/L (ref 22–32)
Calcium: 7.8 mg/dL — ABNORMAL LOW (ref 8.9–10.3)
Chloride: 100 mmol/L (ref 98–111)
Creatinine, Ser: 0.47 mg/dL (ref 0.44–1.00)
GFR calc Af Amer: >60 mL/min (ref >60)
GFR calc non Af Amer: >60 mL/min (ref >60)
Glucose, Bld: 122 mg/dL — ABNORMAL HIGH (ref 70–99)
Potassium: 2.5 mmol/L — CL (ref 3.5–5.1)
Sodium: 138 mmol/L (ref 135–145)
Total Bilirubin: 1.5 mg/dL — ABNORMAL HIGH (ref 0.3–1.2)
Total Protein: 5 g/dL — ABNORMAL LOW (ref 6.5–8.1)

## 2018-06-18 LAB — GLUCOSE, CAPILLARY
Glucose-Capillary: 126 mg/dL — ABNORMAL HIGH (ref 70–99)
Glucose-Capillary: 113 mg/dL — ABNORMAL HIGH (ref 70–99)
Glucose-Capillary: 141 mg/dL — ABNORMAL HIGH (ref 70–99)
Glucose-Capillary: 140 mg/dL — ABNORMAL HIGH (ref 70–99)

## 2018-06-18 LAB — LEGIONELLA PNEUMOPHILA SEROGP 1 UR AG: L. pneumophila Serogp 1 Ur Ag: NEGATIVE

## 2018-06-18 LAB — TYPE AND SCREEN
ABO/RH(D): A POS
Antibody Screen: NEGATIVE
Unit division: 0

## 2018-06-18 LAB — BPAM RBC
Blood Product Expiration Date: 202006092359
ISSUE DATE / TIME: 202005171342
Unit Type and Rh: 6200

## 2018-06-18 LAB — PHOSPHORUS: Phosphorus: 2.6 mg/dL (ref 2.5–4.6)

## 2018-06-18 LAB — MAGNESIUM: Magnesium: 1.7 mg/dL (ref 1.7–2.4)

## 2018-06-18 LAB — PREPARE RBC (CROSSMATCH)

## 2018-06-18 MED ORDER — POTASSIUM CHLORIDE CRYS ER 20 MEQ PO TBCR
40.0000 meq | EXTENDED_RELEASE_TABLET | Freq: Two times a day (BID) | ORAL | Status: AC
  Administered 2018-06-18: 40 meq via ORAL
  Filled 2018-06-18 (×2): qty 2

## 2018-06-18 MED ORDER — MAGNESIUM SULFATE 2 GM/50ML IV SOLN
2.0000 g | Freq: Once | INTRAVENOUS | Status: AC
  Administered 2018-06-18: 09:00:00 2 g via INTRAVENOUS
  Filled 2018-06-18: qty 50

## 2018-06-18 MED ORDER — POTASSIUM CHLORIDE 10 MEQ/100ML IV SOLN
10.0000 meq | INTRAVENOUS | Status: AC
  Administered 2018-06-18 (×4): 10 meq via INTRAVENOUS
  Filled 2018-06-18 (×4): qty 100

## 2018-06-18 MED ORDER — METOPROLOL TARTRATE 25 MG PO TABS
12.5000 mg | ORAL_TABLET | Freq: Two times a day (BID) | ORAL | Status: DC
  Administered 2018-06-18 – 2018-06-22 (×7): 12.5 mg via ORAL
  Filled 2018-06-18 (×8): qty 1

## 2018-06-18 MED ORDER — INSULIN DETEMIR 100 UNIT/ML ~~LOC~~ SOLN
5.0000 [IU] | Freq: Every day | SUBCUTANEOUS | Status: DC
  Administered 2018-06-19 – 2018-06-21 (×3): 5 [IU] via SUBCUTANEOUS
  Filled 2018-06-18 (×6): qty 0.05

## 2018-06-18 MED ORDER — SODIUM CHLORIDE 0.9% IV SOLUTION: Freq: Once | INTRAVENOUS | Status: DC

## 2018-06-18 MED ORDER — ENSURE ENLIVE PO LIQD
237.0000 mL | Freq: Three times a day (TID) | ORAL | Status: DC
  Administered 2018-06-18 – 2018-06-22 (×4): 237 mL via ORAL

## 2018-06-18 NOTE — Progress Notes (Signed)
Inpatient Diabetes Program Recommendations  AACE/ADA: New Consensus Statement on Inpatient Glycemic Control (2015)  Target Ranges:  Prepandial:   less than 140 mg/dL      Peak postprandial:   less than 180 mg/dL (1-2 hours)      Critically ill patients:  140 - 180 mg/dL   Lab Results  Component Value Date   GLUCAP 126 (H) 06/18/2018   HGBA1C 6.7 (H) 06/11/2018    Review of Glycemic Control Results for Stacy Shelton, Stacy Shelton (MRN [id number]) as of 06/18/2018 12:23  Ref. Range 06/17/2018 07:30 06/17/2018 11:56 06/17/2018 15:42 06/17/2018 21:45 06/18/2018 07:49  Glucose-Capillary Latest Ref Range: 70 - 99 mg/dL 67 (L) 68 (L) 120 (H) 163 (H) 126 (H)   Home DM Meds: Levemir 10 units QHS                             Novolog 8 units TID with meals                             Metformin 500 mg Daily   Current Orders: Levemir 10 units QHS                            Novolog Moderate Correction Scale/ SSI (0-15 units) TID AC                             Novolog 8 units TID with meals   Please consider the following:  1. Reduce Levemir to 5 units QHS (50% home dose)  2. Stop Novolog 8 units TID with meals  Thank you, Nani Gasser. Dameer Speiser, RN, MSN, CDE  Diabetes Coordinator Inpatient Glycemic Control Team Team Pager [phone] (8am-5pm) 06/18/2018 12:23 PM

## 2018-06-18 NOTE — Progress Notes (Addendum)
Stacy Shelton   DOB:31-Jan-1960   QM#:[id number]    ASSESSMENT & PLAN:  Metastatic breast cancer She has received 2 cycles of chemotherapy so far with improved disease control I am not concerned about mild changes noted on her CT chest in regards to the lymph nodes and lung nodule.  Liver metastasis is shrinking Continue aggressive supportive care  SVT, hypotensive, resolving ECHO showed no evidence of cardiomyopathy.  The restrictive heart movement is likely due to consolidation of the lungs noted on her CT imaging She will continue her current heart medicine  Acute renal failure, resolved This was secondary to recent nausea, vomiting and dehydration. Observe closely for now  Chronic pancytopenia She had acute on chronic pancytopenia due to recent bone marrow suppression from chemotherapy She has received 2 units of blood transfusionon 06/11/2018 and 1 unit on 06/15/2018 She does not need platelet transfusion unless signs of bleeding or platelet count less than 10,000 She is more symptomatic from her anemia today with increasing fatigue and dyspnea.  Will transfuse 1 unit of packed red blood cells today. Continue close monitoring daily.  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  Her fever is trending down.  Chest x-ray is  reviewed which show possibly mild congestion/overload.  She had receiving Lasix for diuresis and a touch of morphine for anxiety on 06/17/2018 with improvement She will continue to work with physical therapy  Clinical depression, stable She has clinical signs of depression, verified and confirmed by family members I have started her on Remeron that should help with stabilizing her mood I will continue to check on her and provide emotional support Once she is more stable, we will consult psychiatrist to follow  Hypokalemia Replete per primary team  Code Status Full code  Goals of care Aggressive supportive care. Hopefully, she can be moved out of ICU today  Discharge planning I am hopeful she can be transition out of intensive care unit I will continue to check on her tomorrow  All questions were answered. The patient knows to call the clinic with any problems, questions or concerns.   Mikey Bussing, NP 06/18/2018 9:47 AM   I have seen and examined her and edited the notes as above [name] MD  Subjective:  The patient appears anxious.  Reports fatigue and increased dyspnea on exertion.  Still has a nonproductive cough.  T-max 99.3.  Remains on IV antibiotics for presumptive pneumonia. The patient denies any recent signs or symptoms of bleeding such as spontaneous epistaxis, hematuria or hematochezia.   Objective:  Vitals:   06/18/18 0700 06/18/18 0800  BP: (!) 129/40 (!) 115/38  Pulse: (!) 106 100  Resp: (!) 48 (!) 37  Temp:  99.1 F (37.3 C)  SpO2: 95% 98%     Intake/Output Summary (Last 24 hours) at 06/18/2018 0947 Last data filed at 06/18/2018 0656 Gross per 24 hour  Intake 1727.02  ml  Output 2550 ml  Net -822.98 ml    GENERAL:alert, no distress and comfortable.  She appears anxious SKIN: skin color, texture, turgor are normal, no rashes or significant lesi Musculoskeletal:no cyanosis of digits and no clubbing  NEURO: alert & oriented x 3 with fluent  speech, no focal motor/sensory deficits   Labs:  Recent Labs    05/06/18 0430  06/13/18 0146  06/14/18 0206 06/15/18 0500 06/16/18 0232 06/18/18 0616  NA 138   < > 137   < > 138 139 136 138  K 3.6   < > 2.9*   < > 3.9 3.7 3.5 2.5*  CL 99   < > 102   < > 101 102 98 100  CO2 32   < > 26   < > 27 28 28 29   GLUCOSE 138*   < > 165*   < > 85 76 158* 122*  BUN 14   < > 23*   < > 21* 17 13 10   CREATININE 0.54   < > 0.71   < > 0.68 0.63 0.55 0.47  CALCIUM 7.8*   < > 7.7*   < > 7.9* 7.6* 7.6* 7.8*  GFRNONAA >60   < > >60   < > >60 >60 >60 >60  GFRAA >60   < > >60   < > >60 >60 >60 >60  PROT 5.4*   < > 5.6*  --  6.0*  --   --  5.0*  ALBUMIN 1.7*   < > 1.8*  --  1.9*  --   --  1.3*  AST 23   < > 16  --  26  --   --  16  ALT 14   < > 11  --  16  --   --  12  ALKPHOS 372*   < > 96  --  180*  --   --  156*  BILITOT 2.0*   < > 1.3*  --  1.6*  --   --  1.5*  BILIDIR 1.0*  --   --   --   --   --   --   --   IBILI 1.0*  --   --   --   --   --   --   --    < > = values in this interval not displayed.    Studies:  Ct Angio Chest Pe W Or Wo Contrast  Result Date: 06/11/2018 CLINICAL DATA:  Shortness of breath. Metastatic breast cancer. EXAM: CT ANGIOGRAPHY CHEST WITH CONTRAST TECHNIQUE: Multidetector CT imaging of the chest was performed using the standard protocol during bolus administration of intravenous contrast. Multiplanar CT image reconstructions and MIPs were obtained to evaluate the vascular anatomy. CONTRAST:  79mL OMNIPAQUE IOHEXOL 350 MG/ML SOLN COMPARISON:  Chest radiograph 06/11/2018. Thoracic spine CT 05/19/2018. Chest CTA 04/27/2018. FINDINGS: Cardiovascular: Pulmonary arterial opacification is adequate without evidence of lobar more proximal emboli. No segmental emboli are identified although assessment is limited in some areas due to motion artifact. Mild thoracic aortic atherosclerosis is noted without aneurysm. The heart is enlarged. There is no pericardial effusion. A right jugular  Port-A-Cath terminates in the high right atrium. Mediastinum/Nodes: Calcified subcarinal and right hilar lymph nodes are again noted. Mildly enlarged noncalcified lymph nodes including a 10 mm short axis right paratracheal lymph node, levin mm precarinal lymph node, an 11 mm right hilar lymph node are unchanged from the prior chest CTA. The esophagus  and thyroid are unremarkable. Lungs/Pleura: There are small bilateral pleural effusions, unchanged on the right and decreased in size on the left compared to the prior chest CTA. Extensive right lower lobe consolidation has greatly progressed from the prior chest CTA with progression also noted compared to the more recent thoracic spine CT. There is mildly improved aeration of the left lower lobe compared to the prior chest CTA, however moderately extensive consolidation and volume loss remain. A 2.5 x 2.2 cm right upper lobe nodule is unchanged from the thoracic spine CT though has enlarged from the chest CTA (series 10, image 51), as has an 8 mm nodule more anterolaterally in the right upper lobe (series 10, image 50). Multiple subcentimeter nodules are present in the left upper lobe with some being larger compared to the prior chest CT and others being unchanged. Upper Abdomen: Liver lesions have decreased in size, for example a segment VII/VIII lesion now measures 16 mm (series 4, image 85, previously 23 mm). Calcified granulomas are noted in the spleen. Musculoskeletal: Unchanged subcentimeter sclerotic foci in the T11 and T12 vertebral bodies, left glenoid, and inferior right scapular body. Review of the MIP images confirms the above findings. IMPRESSION: 1. No evidence of pulmonary emboli. 2. Extensive bilateral lower lobe consolidation. 3. Small pleural effusions. 4. 2.5 cm right upper lobe nodule, enlarged from 04/27/2018 and which may reflect a metastasis or primary lung cancer. Stable to mild enlargement of subcentimeter nodules in both upper lobes consistent  with known metastases. 5. Decreased size of liver metastases. 6. Unchanged mild mediastinal and right hilar lymphadenopathy. 7. Aortic Atherosclerosis (ICD10-I70.0). Electronically Signed   By: Logan Bores M.D.   On: 06/11/2018 17:02   Ct Thoracic Spine Wo Contrast  Result Date: 05/19/2018 CLINICAL DATA:  Fall last week. Back pain and bilateral legs since fall. EXAM: CT THORACIC SPINE WITHOUT CONTRAST TECHNIQUE: Multidetector CT images of the thoracic were obtained using the standard protocol without intravenous contrast. COMPARISON:  CT chest 04/27/2018 FINDINGS: Alignment: Normal Vertebrae: Negative for fracture or metastatic disease. No lytic or sclerotic bone lesion identified. Paraspinal and other soft tissues: Extensive left lower lobe consolidation as noted previously. Progression of right lower lobe consolidation. Right upper lobe mass lesion approximately 2.4 cm is unchanged and may represent neoplasm. Calcified subcarinal lymph node unchanged. Disc levels: Disc spaces maintained without significant degenerative change or spinal stenosis. IMPRESSION: 1. Negative for fracture or metastatic disease thoracic spine 2. Bibasilar consolidation with progression on the right. Possible pneumonia or tumor. 2.4 cm right upper lobe spiculated mass may represent metastatic disease or bronchogenic carcinoma. Electronically Signed   By: Franchot Gallo M.D.   On: 05/19/2018 17:16   Ct Lumbar Spine Wo Contrast  Result Date: 05/19/2018 CLINICAL DATA:  Fall 1 week ago. Back pain. History of metastatic breast cancer. EXAM: CT LUMBAR SPINE WITHOUT CONTRAST TECHNIQUE: Multidetector CT imaging of the lumbar spine was performed without intravenous contrast administration. Multiplanar CT image reconstructions were also generated. COMPARISON:  CT abdomen pelvis 04/08/2018 FINDINGS: Segmentation: Normal Alignment: Normal Vertebrae: Negative for lumbar fracture. 3 mm sclerotic lesion L3 vertebral body. This has enlarged  slightly from 03/11/2018 therefore is suspicious for metastatic disease. No other sclerotic or lytic lesions. Paraspinal and other soft tissues: Negative for paraspinous mass or adenopathy. Mild atherosclerotic aorta. Disc levels: Mild lumbar disc and facet degeneration. Negative for disc protrusion or stenosis. IMPRESSION: 1. Negative for lumbar fracture 2. 3 mm sclerotic lesion L4 vertebral body likely due to metastatic disease. This  has enlarged since 03/21/2018. Electronically Signed   By: Franchot Gallo M.D.   On: 05/19/2018 17:23   Ct Abdomen Pelvis W Contrast  Result Date: 05/19/2018 CLINICAL DATA:  Blunt abdominal trauma. EXAM: CT ABDOMEN AND PELVIS WITH CONTRAST TECHNIQUE: Multidetector CT imaging of the abdomen and pelvis was performed using the standard protocol following bolus administration of intravenous contrast. CONTRAST:  161mL OMNIPAQUE IOHEXOL 300 MG/ML  SOLN COMPARISON:  CT scan of April 08, 2018. FINDINGS: Lower chest: Bilateral posterior basilar atelectasis or pneumonia is noted. Hepatobiliary: No gallstones or biliary dilatation is noted. Intrahepatic metastatic lesions noted on prior exam are significantly smaller. 1.9 cm lesion is seen in peripheral portion of right hepatic lobe which is decreased compared to prior exam where it measured 2.9 cm. 1.5 cm left hepatic lesion is noted which is significantly smaller compared to prior exam, where it measures 3.7 cm. Pancreas: Unremarkable. No pancreatic ductal dilatation or surrounding inflammatory changes. Spleen: Calcified splenic granulomata are noted. No other significant abnormality is noted. Adrenals/Urinary Tract: Adrenal glands are unremarkable. Kidneys are normal, without renal calculi, focal lesion, or hydronephrosis. Bladder is unremarkable. Stomach/Bowel: The stomach appears normal. There is no evidence of bowel obstruction or inflammation. The appendix is not clearly visualized. Vascular/Lymphatic: Aortic atherosclerosis. No  enlarged abdominal or pelvic lymph nodes. Reproductive: Uterus and bilateral adnexa are unremarkable. Other: There is noted interval enlargement of the right iliacus muscle which is concerning for underlying hematoma. No hernia is noted. Musculoskeletal: No acute or significant osseous findings. IMPRESSION: Interval development of enlargement of right iliacus muscle with surrounding soft tissue stranding concerning for underlying hematoma or possibly severe muscular injury. Hepatic metastatic lesions noted on prior exam is significantly smaller currently. Bilateral posterior basilar atelectasis or pneumonia is noted. Aortic Atherosclerosis (ICD10-I70.0). Electronically Signed   By: Marijo Conception M.D.   On: 05/19/2018 17:17   Dg Pelvis Portable  Result Date: 05/19/2018 CLINICAL DATA:  58 year old female with a fall a few years ago with bilateral hip pain EXAM: PORTABLE PELVIS 1-2 VIEWS COMPARISON:  None. FINDINGS: Bony pelvic ring intact, with no acute displaced fracture. Bilateral hips projects normally over the acetabula. Mild degenerative changes bilateral hips. Unremarkable proximal femurs. IMPRESSION: Negative for acute bony abnormality. Early bilateral hip osteoarthritis Electronically Signed   By: Corrie Mckusick D.O.   On: 05/19/2018 12:34   Dg Chest Port 1 View  Result Date: 06/17/2018 CLINICAL DATA:  58 year old female with respiratory distress EXAM: PORTABLE CHEST 1 VIEW COMPARISON:  06/16/2018, 06/12/2018, CT 06/11/2018 FINDINGS: Cardiomediastinal silhouette likely unchanged in size and contour. Heart borders partially obscured by overlying lung and pleural disease. Opacities at the bilateral lung bases obscuring the hemidiaphragms and heart borders, appears unchanged from prior. Graded opacities towards the bases. Right hilar opacity persists. Partially calcified nodes at the right hilum. No pneumothorax. Right IJ port catheter. No displaced fracture IMPRESSION: Similar appearance of the chest  x-ray, with bilateral right greater than left pleural effusions and associated atelectasis/consolidation. Right hilar opacities better seen on recent CT imaging. Unchanged right IJ port catheter. Electronically Signed   By: Corrie Mckusick D.O.   On: 06/17/2018 08:05   Dg Chest Port 1 View  Result Date: 06/16/2018 CLINICAL DATA:  Metastatic breast cancer pneumonia EXAM: PORTABLE CHEST 1 VIEW COMPARISON:  06/12/2018 FINDINGS: Right-sided Port-A-Cath with the tip projecting over the SVC. Small right pleural effusion. Bilateral lower lobe airspace disease most concerning for pneumonia. Persistent right upper lobe pulmonary nodule consistent malignancy. No pneumothorax. Stable cardiomediastinal  silhouette. No aggressive osseous lesion. IMPRESSION: 1. Bilateral lower lobe airspace disease most consistent with pneumonia. Small right pleural effusion. 2. Persistent right upper lobe pulmonary nodule consistent with malignancy. Electronically Signed   By: Kathreen Devoid   On: 06/16/2018 08:49   Dg Chest Port 1 View  Result Date: 06/12/2018 CLINICAL DATA:  58 year old female with possible pneumonia in cancer patient. EXAM: PORTABLE CHEST 1 VIEW COMPARISON:  06/11/2018 and earlier. FINDINGS: Portable AP semi upright view at 0943 hours. Stable right chest power port, accessed. Increasing confluence of mid and lower lung opacity. Consolidation demonstrated by CT yesterday. Trace pleural fluid more apparent by CT. No superimposed pneumothorax or pulmonary edema. Stable cardiac size and mediastinal contours. Visualized tracheal air column is within normal limits. IMPRESSION: Mild radiographic progression of bilateral consolidation/pneumonia since yesterday. Electronically Signed   By: Genevie Ann M.D.   On: 06/12/2018 10:37   Dg Chest Port 1 View  Result Date: 06/11/2018 CLINICAL DATA:  Hypotension. EXAM: PORTABLE CHEST 1 VIEW COMPARISON:  Radiograph of May 19, 2018. FINDINGS: Stable cardiomediastinal silhouette. Right  internal jugular Port-A-Cath is unchanged in position. No pneumothorax is noted. Increased bibasilar opacities are noted concerning for pneumonia or edema. Small pleural effusions may be present. Bony thorax is unremarkable. IMPRESSION: Increased bibasilar opacities are noted concerning for worsening pneumonia or edema. Small pleural effusions may be present. Electronically Signed   By: Marijo Conception M.D.   On: 06/11/2018 11:43   Dg Chest Port 1 View  Result Date: 05/19/2018 CLINICAL DATA:  Fall last Tuesday. Pain in both hips and legs. Fever. EXAM: PORTABLE CHEST 1 VIEW COMPARISON:  May 05, 2018 FINDINGS: The right Port-A-Cath terminates in the central SVC. There is mild opacity in left base, significantly improved since May 05, 2018. Most of the right-sided infiltrate is also resolved. Minimal opacity remains in the medial right upper lobe. No other interval changes. IMPRESSION: Mild opacities remain in the medial right upper lobe in the left base, significantly improved in the interval. The remainder of infiltrates previously seen have resolved. Recommend follow-up to complete resolution. Electronically Signed   By: Dorise Bullion III M.D   On: 05/19/2018 12:33

## 2018-06-18 NOTE — Progress Notes (Signed)
Nutrition Follow-up  RD working remotely.   DOCUMENTATION CODES:   (unable to assess for malnutrition)  INTERVENTION:  - will increase Ensure Enlive to TID, each supplement provides 350 kcal and 20 grams of protein. - continue to encourage PO intakes. - will continue to monitor for additional nutrition-related needs.    NUTRITION DIAGNOSIS:   Increased nutrient needs related to chronic illness, cancer and cancer related treatments as evidenced by estimated needs. -ongoing  GOAL:   Patient will meet greater than or equal to 90% of their needs -unmet  MONITOR:   PO intake, Supplement acceptance, Labs, Weight trends  ASSESSMENT:   58 year old woman undergoing active chemotherapy for metastatic breast cancer who presented with hypotension and severe anemia.  Weight has fluctuated slightly throughout hospitalization (1 week). Last recorded meal completions were 0% of breakfast and lunch on 5/18 and no other meal intakes documented since admission. Per review of order, patient has accepted 9 of the 10 bottles of Ensure offered to her. Patient had done well with consuming this supplement during last hospitalization as well.   Per notes: acute hypoxic respiratory failure 2/2 HCAP, active chemotherapy d/t metastatic breast cancer, chemo-induced cardiomyopathy, acute on chronic anemia. Oncology following during hospitalization and Dr. Calton Dach note from yesterday indicates that patient has received 2 cycles of chemo with improvement in disease control noted and that liver met is shrinking. Encouragement has been made to eat as well as she can. Patient to remain full code.    Medications reviewed; 100 mg colace BID, 40 mg IV lasix x1 dose 5/19, sliding scale novolog, 8 units novolog TID, 10 units levemir/day, 2 g IV Mg sulfate x1 run 5/19, daily multivitamin with minerals, 15 mg remeron at bedtime.  Labs reviewed; no BMP since 5/17.     NUTRITION - FOCUSED PHYSICAL EXAM:  unable to  complete at this time.   Diet Order:   Diet Order            Diet regular Room service appropriate? Yes; Fluid consistency: Thin  Diet effective now              EDUCATION NEEDS:   Not appropriate for education at this time  Skin:  Skin Assessment: Reviewed RN Assessment  Last BM:  5/19  Height:   Ht Readings from Last 1 Encounters:  06/11/18 5' 2"  (1.575 m)    Weight:   Wt Readings from Last 1 Encounters:  06/16/18 60.7 kg    Ideal Body Weight:  50 kg  BMI:  Body mass index is 24.48 kg/m.  Estimated Nutritional Needs:   Kcal:  2020-2200 kcal  Protein:  100-110 grams  Fluid:  >/= 2 L/day     Jarome Matin, MS, RD, LDN, [facility] Inpatient Clinical Dietitian Pager # [phone] After hours/weekend pager # [phone]

## 2018-06-18 NOTE — Progress Notes (Addendum)
PROGRESS NOTE    [name]  NLG:[id number] DOB: November 28, 1960 DOA: 06/11/2018 PCP: Robyne Peers, MD   Brief Narrative:  Stacy Shelton is a pleasant 58 year old woman with history of metastatic breast cancer who is currently undergoing chemotherapy by Dr. Ernst Spell with last session on 05/28/2018 (Taxotere, Herceptin, Perjeta). she was feeling fine until 06/11/2018 when she started having nausea, vomiting and generalized weakness and went to see her oncologist and was found to be having anemia with hemoglobin of 6.2 and acute kidney injury with creatinine of 1.63 and was hypotensive and hyperglycemic so she was sent to the emergency department for further management.  She was initially admitted under hospitalist service but due to deterioration and requirement of vasopressors, she was transferred under PCCM/intensivist care.  She received total of 3 units of PRBC and vasopressors for some time.  She then he started having fever and was diagnosed with pneumonia and was started on IV Rocephin and Zithromax.  She also developed some SVT on 06/14/2018 for which cardiology was consulted.  She was given a dose of adenosine and she was then started on amiodarone. Transthoracic echo did not show any acute pathology.  She was transferred under hospitalist service on 06/14/2018.  Her hemoglobin dropped again and she received 1 more unit of transfusion on 06/15/2018.  Patient has been having intermittent fever since then.  Due to being a cancer patient, she qualified for HCAP so antibiotics were broadened to IV Zosyn. Now on 5 Liters.    Assessment & Plan:   Active Problems:   Thrombocytopenia ([place])   Uncontrolled diabetes mellitus with hyperglycemia (HCC)   Metastatic breast cancer (HCC)   Metastasis to brain ([place])   Metastasis to bone ([place])   Sepsis ([place])   Pneumonia due to infectious organism   Chronic diastolic CHF (congestive heart failure) (HCC)   Anemia associated with chemotherapy   Chemotherapy-induced  thrombocytopenia   SVT (supraventricular tachycardia) (HCC)   Cancer associated pain   Metastasis to lung (HCC)   Acute renal failure (ARF) (HCC)   Severe anemia   Pressure injury of skin   Tachycardia   Acute diastolic heart failure (HCC)  Acute Hypoxic Respiratory Failure Secondary to healthcare associated pneumonia, improving  -Due to being a cancer patient receiving active chemotherapy, she qualifies for healthcare associated pneumonia.   -Still having intermittent fever but this has improved from yesterday and TMax was 100 -Now on 5 Liters of O2 via [place]  -C/w Benzonatate and Tussionex -All cultures remain negative. Will Continue Zosyn for at least 1-2 more days before considering switching to something else if she has persistent fever.  -Received NS bolus on 5/18 and IV Lasix 40 mg yesterday  -Has been tested negative for COVID.   -Urine antigen for Legionella and streptococci are negative -Pprocalcitonin improving.  -C/w Albuterol 2.5 mg Neb q4hprn Wheezing and SOB  SVT -?  Chemotherapy-induced cardiomyopathy. -Continues to be in Sinus Rhythm. -Seen by Cardiology. -Continues to be on oral Metoprolol 12.5 mg po BID and Amiodarone 200 mg po Daily    -Monitor on telemetry.  -Echo with no changes.  Acute on Chronic Anemia -Again likely due to chemotherapy.   -Received total of 4 units of PRBC transfusion so far with the last being on 06/15/2018.   -Hemoglobin above 7 now and Hb/HCt was 7.3/23.9.   -Medical Oncology on board.  Thrombocytopenia -Again likely due to chemotherapy but now worsened in the setting of IV Zosyn.   -Platelets around 20.  No signs of bleeding.  Monitor daily.  Oncology on board.  Type 2 Diabetes Mellitus -Controlled. Had lows of 67 and 68 so will Reduce Levemir from 10 units to 5 units -Will D/C Novolog with Meals -Was on Insulin gtt -C/w Moderate Novolog SSI 0-15 units TID  Metastatic Breast Cancer -Management per Oncology Dr. Alvy Bimler -Has  received 2 Cycles of Chemotherapy    History of Asthma -Not in Acute exacerbation.   -Continue Montelukast 10 mg po Daily -C/w Albuterol 2.5 mg Neb q4hprn Wheezing and SOB  Hypokalemia -In the setting of IV Lasix  -Patient's K+ was 2.5 -Replete with IV 40 mEQ and po KCl 40 mEQ BID x2 -Continue to Monitor and Replete as Necessary -Repeat CMP in AM   Severe Protein Calorie Malnutrition  -Nutritionist working Remotely -C/w [employer] TID -Encourage Po Intake  -C/w Mirtazapine 15 mg po qHS  Acute on Chronic Diastolic CHF -Given IV Lasix Yesterday with improvement -Strict I's/O's and Daily Weights -Patient is +6.769 and Weight is +1 lb but not done in the last few days -Continue to Monitor for S/Sx of Volume Overload -Repeat CXR in AM   DVT prophylaxis: SCDs Code Status: FULL CODE  Family Communication: No family present at bedside  Disposition Plan: Transferred to the Medical Floor now that she is improved.   Consultants:   Cardiology  Oncology  PCCM  Procedures:  ECHOCARDIOGRAM IMPRESSIONS    1. The left ventricle has normal systolic function, with an ejection fraction of 60-65%. The cavity size was normal. Left ventricular diastolic function could not be evaluated due to nondiagnostic images. No evidence of left ventricular regional wall  motion abnormalities.  2. The right ventricle has normal systolc function. The cavity was normal. There is no increase in right ventricular wall thickness.  3. Right atrial size was mildly dilated.  4. There is prominent interventricular septal bounce that is not related to respirations.  5. The aortic valve is tricuspid.  6. Pulmonic valve regurgitation was not assessed by color flow Doppler.  7. There is significant interventricular septal bounce that is more prominent in diastole. There is a trivial posterior pericardial effusion. Differential diagnosis includes acute PE, acute pulmonary process or constrictive  pericarditis. Recommend  repeat echo with respirometer outpatient once respiratory illness resolved. If still present then consider cardiac MRI to rule out contrictive pericarditis.  FINDINGS  Left Ventricle: The left ventricle has normal systolic function, with an ejection fraction of 60-65%. The cavity size was normal. There is no increase in left ventricular wall thickness. Left ventricular diastolic function could not be evaluated due to  nondiagnostic images. No evidence of left ventricular regional wall motion abnormalities.    Right Ventricle: The right ventricle has normal systolic function. The cavity was normal. There is no increase in right ventricular wall thickness.  Left Atrium: Left atrial size was normal in size.  Right Atrium: Right atrial size was mildly dilated. Right atrial pressure is estimated at 3 mmHg.  Interatrial Septum: No atrial level shunt detected by color flow Doppler.  Pericardium: There is no evidence of pericardial effusion. There is septal bounce. There is significant interventricular septal bounce that is more prominent in diastole. There is a trivial posterior pericardial effusion. Differential diagnosis includes  acute PE, acute pulmonary process or constrictive pericarditis. Recommend repeat echo with respirometer outpatient once respiratory illness resolved. If still present then consider cardiac MRI to rule out contrictive pericarditis.  Mitral Valve: The mitral valve is normal in structure. Mitral  valve regurgitation is mild by color flow Doppler.  Tricuspid Valve: The tricuspid valve was normal in structure. Tricuspid valve regurgitation is mild by color flow Doppler.  Aortic Valve: The aortic valve is tricuspid Aortic valve regurgitation was not visualized by color flow Doppler.  Pulmonic Valve: The pulmonic valve was normal in structure. Pulmonic valve regurgitation was not assessed by color flow Doppler.  Venous: The inferior vena  cava is normal in size with greater than 50% respiratory variability.    +--------------+--------++ LEFT VENTRICLE         +--------------+--------++ PLAX 2D                +--------------+--------++ LVIDd:        4.41 cm  +--------------+--------++ LVIDs:        3.22 cm  +--------------+--------++ LV PW:        0.94 cm  +--------------+--------++ LV IVS:       0.69 cm  +--------------+--------++ LVOT diam:    1.80 cm  +--------------+--------++ LV SV:        47 ml    +--------------+--------++ LV SV Index:  28.42    +--------------+--------++ LVOT Area:    2.54 cm +--------------+--------++                        +--------------+--------++  +---------------+---------++ RIGHT VENTRICLE          +---------------+---------++ RV Basal diam: 2.64 cm   +---------------+---------++ RVSP:          29.4 mmHg +---------------+---------++  +---------------+-------++-----------++ LEFT ATRIUM           Index       +---------------+-------++-----------++ LA diam:       3.70 cm2.30 cm/m  +---------------+-------++-----------++ LA Vol (A2C):  41.7 ml25.89 ml/m +---------------+-------++-----------++ LA Vol (A4C):  40.3 ml25.02 ml/m +---------------+-------++-----------++ LA Biplane Vol:43.4 ml26.95 ml/m +---------------+-------++-----------++ +------------+---------++-----------++ RIGHT ATRIUM         Index       +------------+---------++-----------++ RA Pressure:3.00 mmHg            +------------+---------++-----------++ RA Area:    16.70 cm            +------------+---------++-----------++ RA Volume:  44.70 ml 27.76 ml/m +------------+---------++-----------++  +------------+-----------++ AORTIC VALVE            +------------+-----------++ LVOT Vmax:  138.00 cm/s +------------+-----------++ LVOT Vmean: 92.600 cm/s  +------------+-----------++ LVOT VTI:   0.253 m     +------------+-----------++   +-------------+-------++ AORTA                +-------------+-------++ Ao Root diam:2.70 cm +-------------+-------++ Ao Asc diam: 2.50 cm +-------------+-------++  +--------------+----------++ +---------------+-----------++ MITRAL VALVE             TRICUSPID VALVE            +--------------+----------++ +---------------+-----------++ MV Area (PHT):4.06 cm   TR Peak grad:  26.4 mmHg   +--------------+----------++ +---------------+-----------++ MV PHT:       54.23 msec TR Vmax:       257.00 cm/s +--------------+----------++ +---------------+-----------++ MV Decel Time:187 msec   Estimated RAP: 3.00 mmHg   +--------------+----------++ +---------------+-----------++ +--------------+----------++ RVSP:          29.4 mmHg   MV E velocity:99.80 cm/s +---------------+-----------++ +--------------+----------++ MV A velocity:93.40 cm/s +--------------+-------+ +--------------+----------++ SHUNTS                MV E/A ratio: 1.07       +--------------+-------+ +--------------+----------++ Systemic VTI: 0.25 m                               +--------------+-------+  Systemic Diam:1.80 cm                              +--------------+-------+  Antimicrobials:  Anti-infectives (From admission, onward)   Start     Dose/Rate Route Frequency Ordered Stop   06/16/18 0900  piperacillin-tazobactam (ZOSYN) IVPB 3.375 g     3.375 g 12.5 mL/hr over 240 Minutes Intravenous Every 8 hours 06/16/18 0738     06/11/18 1700  cefTRIAXone (ROCEPHIN) 1 g in sodium chloride 0.9 % 100 mL IVPB     1 g 200 mL/hr over 30 Minutes Intravenous Every 24 hours 06/11/18 1317 06/15/18 2159   06/11/18 1600  azithromycin (ZITHROMAX) 500 mg in sodium chloride 0.9 % 250 mL IVPB     500 mg 250 mL/hr over 60 Minutes Intravenous Every 24 hours  06/11/18 1317 06/15/18 2159   06/11/18 1230  cefTRIAXone (ROCEPHIN) 1 g in sodium chloride 0.9 % 100 mL IVPB  Status:  Discontinued     1 g 200 mL/hr over 30 Minutes Intravenous  Once 06/11/18 1216 06/11/18 1506   06/11/18 1230  azithromycin (ZITHROMAX) 500 mg in sodium chloride 0.9 % 250 mL IVPB  Status:  Discontinued     500 mg 250 mL/hr over 60 Minutes Intravenous  Once 06/11/18 1216 06/11/18 1506     Subjective: Patient examined at bedside and had a continuous dry cough when attempting to speak.  States that shortness breath was feeling a little bit better but she has some nausea this morning.  States he slept well.  No other concerns or complaints at this time.  Discussed with oncology and she is stable to move to the medical floor telemetry.  Objective: Vitals:   06/18/18 0400 06/18/18 0500 06/18/18 0600 06/18/18 0700  BP: (!) 129/33 (!) 139/42 (!) 124/35 (!) 129/40  Pulse: (!) 111 (!) 109 (!) 104 (!) 106  Resp: (!) 46 (!) 50 (!) 42 (!) 48  Temp: 99.3 F (37.4 C)     TempSrc: Oral     SpO2: 95% 94% 95% 95%  Weight:      Height:        Intake/Output Summary (Last 24 hours) at 06/18/2018 0756 Last data filed at 06/18/2018 4010 Gross per 24 hour  Intake 1727.02 ml  Output 2550 ml  Net -822.98 ml   Filed Weights   06/14/18 0500 06/15/18 0500 06/16/18 0500  Weight: 60.6 kg 61 kg 60.7 kg   Examination: Physical Exam:  Constitutional: Chronically ill appearing Caucasian female in NAD and appears calm  Eyes: Lids and conjunctivae normal, sclerae anicteric  ENMT: External Ears, Nose appear normal. Grossly normal hearing.  Neck: Appears normal, supple, no cervical masses, normal ROM, no appreciable thyromegaly; no JVD Respiratory: Diminished to auscultation bilaterally with coarse breath sounds; No wheezing, rales, rhonchi or crackles. Normal respiratory effort and patient is not tachypenic. No accessory muscle use. Wearing 5 Liters of Supplemental O2 via [place] Cardiovascular:  RRR, no murmurs / rubs / gallops. S1 and S2 auscultated. Trace extremity edema.  Abdomen: Soft, non-tender, non-distended. No masses palpated. No appreciable hepatosplenomegaly. Bowel sounds positive x4.  GU: Deferred. Musculoskeletal: No clubbing / cyanosis of digits/nails. No joint deformity upper and lower extremities. Has a Port-A-Cath in place Skin: No rashes, lesions, ulcers on a limited skin evaluaation. No induration; Warm and dry.  Neurologic: CN 2-12 grossly intact with no focal deficits. Romberg sign and cerebellar reflexes not assessed.  Psychiatric: Normal  judgment and insight. Alert and oriented x 3. Normal mood and appropriate affect.   Data Reviewed: I have personally reviewed following labs and imaging studies  CBC: Recent Labs  Lab 06/11/18 0905  06/12/18 0227  06/13/18 0146  06/14/18 0206 06/14/18 0600 06/15/18 0500 06/15/18 1934 06/16/18 0232 06/17/18 1137  WBC 15.3*   < > 9.6   < > 9.7  --  14.9*  --  8.3  --  8.3 9.4  NEUTROABS 10.2*  --  5.5  --  5.7  --  8.1*  --   --   --   --  6.4  HGB 6.2*   < > 7.8*   < > 8.2*   < > 8.5* 7.9* 6.1* 8.0* 8.3* 7.6*  HCT 20.0*   < > 23.5*   < > 25.0*   < > 27.4* 24.6* 19.4* 24.8* 25.6* 24.6*  MCV 86.2   < > 86.4   < > 86.5  --  89.5  --  90.7  --  88.0 91.1  PLT 63*   < > 39*   < > 36*  --  36*  --  22*  --  21* 18*   < > = values in this interval not displayed.   Basic Metabolic Panel: Recent Labs  Lab 06/11/18 2030  06/13/18 0146 06/13/18 1700 06/14/18 0206 06/15/18 0500 06/16/18 0232 06/17/18 0416 06/18/18 0616  NA  --    < > 137 136 138 139 136  --   --   K  --    < > 2.9* 2.9* 3.9 3.7 3.5  --   --   CL  --    < > 102 97* 101 102 98  --   --   CO2  --    < > 26 27 27 28 28   --   --   GLUCOSE  --    < > 165* 217* 85 76 158*  --   --   BUN  --    < > 23* 21* 21* 17 13  --   --   CREATININE 1.04*   < > 0.71 0.82 0.68 0.63 0.55  --   --   CALCIUM 8.2*   < > 7.7* 7.8* 7.9* 7.6* 7.6*  --   --   MG 2.1  --   --   2.2 1.9 1.8 1.7 1.4* 1.7  PHOS 2.6  --   --   --   --   --   --   --   --    < > = values in this interval not displayed.   GFR: Estimated Creatinine Clearance: 65.7 mL/min (by C-G formula based on SCr of 0.55 mg/dL). Liver Function Tests: Recent Labs  Lab 06/11/18 0905 06/12/18 0227 06/13/18 0146 06/14/18 0206  AST 11* 14* 16 26  ALT 13 12 11 16   ALKPHOS 137* 98 96 180*  BILITOT 1.3* 1.6* 1.3* 1.6*  PROT 7.0 5.6* 5.6* 6.0*  ALBUMIN 2.1* 1.9* 1.8* 1.9*   No results for input(s): LIPASE, AMYLASE in the last 168 hours. No results for input(s): AMMONIA in the last 168 hours. Coagulation Profile: Recent Labs  Lab 06/12/18 0227  INR 1.5*   Cardiac Enzymes: No results for input(s): CKTOTAL, CKMB, CKMBINDEX, TROPONINI in the last 168 hours. BNP (last 3 results) No results for input(s): PROBNP in the last 8760 hours. HbA1C: No results for input(s): HGBA1C in the last 72 hours. CBG: Recent Labs  Lab 06/17/18 0730 06/17/18 1156 06/17/18 1542 06/17/18 2145 06/18/18 0749  GLUCAP 67* 68* 120* 163* 126*   Lipid Profile: No results for input(s): CHOL, HDL, LDLCALC, TRIG, CHOLHDL, LDLDIRECT in the last 72 hours. Thyroid Function Tests: No results for input(s): TSH, T4TOTAL, FREET4, T3FREE, THYROIDAB in the last 72 hours. Anemia Panel: No results for input(s): VITAMINB12, FOLATE, FERRITIN, TIBC, IRON, RETICCTPCT in the last 72 hours. Sepsis Labs: Recent Labs  Lab 06/11/18 1055 06/11/18 1640 06/12/18 0227 06/13/18 0146 06/16/18 0530  PROCALCITON  --  31.64 32.72 18.35 6.35  LATICACIDVEN 2.3* 2.6*  --   --   --     Recent Results (from the past 240 hour(s))  Blood Culture (routine x 2)     Status: None   Collection Time: 06/11/18 10:55 AM  Result Value Ref Range Status   Specimen Description   Final    BLOOD LEFT ARM Performed at [facility], [place] [address], [place], [place] [address]    Special Requests   Final    BOTTLES DRAWN AEROBIC AND ANAEROBIC Blood  Culture adequate volume Performed at [place] [address] [place], [place] [address]    Culture   Final    NO GROWTH 5 DAYS Performed at [facility], [place] [address] [place], [place] [address]    Report Status 06/16/2018 FINAL  Final  Urine culture     Status: Abnormal   Collection Time: 06/11/18 10:55 AM  Result Value Ref Range Status   Specimen Description   Final    Urine Performed at [place] [address], [place], [place] [address]    Special Requests   Final    Immunocompromised Performed at [facility], [place] [address], [place], [place] [address]    Culture (A)  Final    <10,000 COLONIES/mL INSIGNIFICANT GROWTH Performed at [place] [address], [place], [place] [address]    Report Status 06/14/2018 FINAL  Final  Blood Culture (routine x 2)     Status: None   Collection Time: 06/11/18 11:00 AM  Result Value Ref Range Status   Specimen Description   Final    BLOOD CHEST RIGHT Performed at [place] [address], [place], [place] [address]    Special Requests   Final    BOTTLES DRAWN AEROBIC AND ANAEROBIC Blood Culture adequate volume Performed at [place] [address], [place], [place] [address]    Culture   Final    NO GROWTH 5 DAYS Performed at [facility], [place] [address], [place], [place] [address]    Report Status 06/16/2018 FINAL  Final  SARS Coronavirus 2 (CEPHEID - Performed in [place] hospital lab), Hosp Order     Status: None   Collection Time: 06/11/18 11:50 AM  Result Value Ref Range Status   SARS Coronavirus 2 NEGATIVE NEGATIVE Final    Comment: (NOTE) If result is NEGATIVE SARS-CoV-2 target nucleic acids are NOT DETECTED. The SARS-CoV-2 RNA is generally detectable in upper and lower  respiratory specimens during the acute phase of infection. The lowest  concentration of SARS-CoV-2 viral copies  this assay can detect is 250  copies / mL. A negative result does not preclude SARS-CoV-2 infection  and should not be used as the sole basis for treatment or other  patient management decisions.  A negative result may occur with  improper specimen collection /  handling, submission of specimen other  than nasopharyngeal swab, presence of viral mutation(s) within the  areas targeted by this assay, and inadequate number of viral copies  (<250 copies / mL). A negative result must be combined with clinical  observations, patient history, and epidemiological information. If result is POSITIVE SARS-CoV-2 target nucleic acids are DETECTED. The SARS-CoV-2 RNA is generally detectable in upper and lower  respiratory specimens dur ing the acute phase of infection.  Positive  results are indicative of active infection with SARS-CoV-2.  Clinical  correlation with patient history and other diagnostic information is  necessary to determine patient infection status.  Positive results do  not rule out bacterial infection or co-infection with other viruses. If result is PRESUMPTIVE POSTIVE SARS-CoV-2 nucleic acids MAY BE PRESENT.   A presumptive positive result was obtained on the submitted specimen  and confirmed on repeat testing.  While 2019 novel coronavirus  (SARS-CoV-2) nucleic acids may be present in the submitted sample  additional confirmatory testing may be necessary for epidemiological  and / or clinical management purposes  to differentiate between  SARS-CoV-2 and other Sarbecovirus currently known to infect humans.  If clinically indicated additional testing with an alternate test  methodology [phone]) is advised. The SARS-CoV-2 RNA is generally  detectable in upper and lower respiratory sp ecimens during the acute  phase of infection. The expected result is Negative. Fact Sheet for Patients:  StrictlyIdeas.no Fact Sheet for Healthcare Providers:  BankingDealers.co.za This test is not yet approved or cleared by the [place] FDA and has been authorized for detection and/or diagnosis of SARS-CoV-2 by FDA under an Emergency Use Authorization (EUA).  This EUA will remain in effect (meaning this test can be used) for the duration of the COVID-19 declaration under Section 564(b)(1) of the Act, 21 U.S.C. section 360bbb-3(b)(1), unless the authorization is terminated or revoked sooner. Performed at [facility], [place] [address], [place], [place] [address]   MRSA PCR Screening     Status: None   Collection Time: 06/11/18  3:57 PM  Result Value Ref Range Status   MRSA by PCR NEGATIVE NEGATIVE Final    Comment:        The GeneXpert MRSA Assay (FDA approved for NASAL specimens only), is one component of a comprehensive MRSA colonization surveillance program. It is not intended to diagnose MRSA infection nor to guide or monitor treatment for MRSA infections. Performed at [facility], [place] [address] [place] [address], [place], [place] [address]      RN Pressure Injury Documentation: Pressure Injury 06/11/18 Stage II -  Partial thickness loss of dermis presenting as a shallow open ulcer with a red, pink wound bed without slough. (Active)  06/11/18 1900  Location: Sacrum  Location Orientation: Mid  Staging: Stage II -  Partial thickness loss of dermis presenting as a shallow open ulcer with a red, pink wound bed without slough.  Wound Description (Comments):   Present on Admission: Yes     Pressure Injury 06/12/18 Stage I -  Intact skin with non-blanchable redness of a localized area usually over a bony prominence. Deep purple and pink (Active)  06/12/18 1600  Location: Heel  Location Orientation: Right  Staging: Stage I -  Intact skin with non-blanchable redness of a localized area usually over a bony prominence.  Wound Description (Comments): Deep purple and pink  Present on  Admission:      Estimated body mass index is 24.48 kg/m as calculated from the following:  Height as of this encounter: 5\' 2"  (1.575 m).   Weight as of this encounter: 60.7 kg.  Malnutrition Type:  Nutrition Problem: Increased nutrient needs Etiology: chronic illness, cancer and cancer related treatments   Malnutrition Characteristics:  Signs/Symptoms: estimated needs   Nutrition Interventions:  Interventions: Ensure Enlive (each supplement provides 350kcal and 20 grams of protein), MVI  Radiology Studies: Dg Chest Port 1 View  Result Date: 06/17/2018 CLINICAL DATA:  58 year old female with respiratory distress EXAM: PORTABLE CHEST 1 VIEW COMPARISON:  06/16/2018, 06/12/2018, CT 06/11/2018 FINDINGS: Cardiomediastinal silhouette likely unchanged in size and contour. Heart borders partially obscured by overlying lung and pleural disease. Opacities at the bilateral lung bases obscuring the hemidiaphragms and heart borders, appears unchanged from prior. Graded opacities towards the bases. Right hilar opacity persists. Partially calcified nodes at the right hilum. No pneumothorax. Right IJ port catheter. No displaced fracture IMPRESSION: Similar appearance of the chest x-ray, with bilateral right greater than left pleural effusions and associated atelectasis/consolidation. Right hilar opacities better seen on recent CT imaging. Unchanged right IJ port catheter. Electronically Signed   By: Corrie Mckusick D.O.   On: 06/17/2018 08:05   Dg Chest Port 1 View  Result Date: 06/16/2018 CLINICAL DATA:  Metastatic breast cancer pneumonia EXAM: PORTABLE CHEST 1 VIEW COMPARISON:  06/12/2018 FINDINGS: Right-sided Port-A-Cath with the tip projecting over the SVC. Small right pleural effusion. Bilateral lower lobe airspace disease most concerning for pneumonia. Persistent right upper lobe pulmonary nodule consistent malignancy. No pneumothorax. Stable cardiomediastinal silhouette. No aggressive osseous  lesion. IMPRESSION: 1. Bilateral lower lobe airspace disease most consistent with pneumonia. Small right pleural effusion. 2. Persistent right upper lobe pulmonary nodule consistent with malignancy. Electronically Signed   By: Kathreen Devoid   On: 06/16/2018 08:49   Scheduled Meds: . acyclovir ointment   Topical Q3H  . amiodarone  200 mg Oral Daily  . Chlorhexidine Gluconate Cloth  6 each Topical Daily  . docusate sodium  100 mg Oral BID  . feeding supplement (ENSURE ENLIVE)  237 mL Oral BID BM  . insulin aspart  0-15 Units Subcutaneous TID WC  . insulin aspart  8 Units Subcutaneous TID WC  . insulin detemir  10 Units Subcutaneous QHS  . mouth rinse  15 mL Mouth Rinse BID  . metoprolol tartrate  25 mg Oral BID  . mirtazapine  15 mg Oral QHS  . montelukast  10 mg Oral Daily  . multivitamin with minerals  1 tablet Oral Daily  . pantoprazole sodium  40 mg Oral Daily  . sodium chloride flush  10-40 mL Intracatheter Q12H   Continuous Infusions: . sodium chloride Stopped (06/17/18 0818)  . lactated ringers 10 mL/hr at 06/14/18 1200  . piperacillin-tazobactam Stopped (06/18/18 0440)    LOS: 7 days   Kerney Elbe, DO [facility] PAGER is on AMION  If 7PM-7AM, please contact night-coverage [email or web address] Password [facility] 06/18/2018, 7:56 AM

## 2018-06-19 ENCOUNTER — Inpatient Hospital Stay (HOSPITAL_COMMUNITY): Payer: BLUE CROSS/BLUE SHIELD

## 2018-06-19 LAB — TYPE AND SCREEN
ABO/RH(D): A POS
Antibody Screen: NEGATIVE
Unit division: 0

## 2018-06-19 LAB — BPAM RBC
Blood Product Expiration Date: 202005272359
ISSUE DATE / TIME: 202005201553
Unit Type and Rh: 6200

## 2018-06-19 LAB — CBC WITH DIFFERENTIAL/PLATELET
Abs Immature Granulocytes: 1.96 10*3/uL — ABNORMAL HIGH (ref 0.00–0.07)
Basophils Absolute: 0.1 10*3/uL (ref 0.0–0.1)
Basophils Relative: 1 %
Eosinophils Absolute: 0 10*3/uL (ref 0.0–0.5)
Eosinophils Relative: 0 %
HCT: 26.3 % — ABNORMAL LOW (ref 36.0–46.0)
Hemoglobin: 8.5 g/dL — ABNORMAL LOW (ref 12.0–15.0)
Immature Granulocytes: 17 %
Lymphocytes Relative: 11 %
Lymphs Abs: 1.2 10*3/uL (ref 0.7–4.0)
MCH: 29 pg (ref 26.0–34.0)
MCHC: 32.3 g/dL (ref 30.0–36.0)
MCV: 89.8 fL (ref 80.0–100.0)
Monocytes Absolute: 0.3 10*3/uL (ref 0.1–1.0)
Monocytes Relative: 3 %
Neutro Abs: 7.9 10*3/uL — ABNORMAL HIGH (ref 1.7–7.7)
Neutrophils Relative %: 68 %
Platelets: 17 10*3/uL — CL (ref 150–400)
RBC: 2.93 MIL/uL — ABNORMAL LOW (ref 3.87–5.11)
RDW: 16.6 % — ABNORMAL HIGH (ref 11.5–15.5)
WBC: 11.5 10*3/uL — ABNORMAL HIGH (ref 4.0–10.5)
nRBC: 0 % (ref 0.0–0.2)

## 2018-06-19 LAB — MAGNESIUM: Magnesium: 2 mg/dL (ref 1.7–2.4)

## 2018-06-19 LAB — BASIC METABOLIC PANEL
Anion gap: 8 (ref 5–15)
BUN: 12 mg/dL (ref 6–20)
CO2: 29 mmol/L (ref 22–32)
Calcium: 8.1 mg/dL — ABNORMAL LOW (ref 8.9–10.3)
Chloride: 101 mmol/L (ref 98–111)
Creatinine, Ser: 0.44 mg/dL (ref 0.44–1.00)
GFR calc Af Amer: >60 mL/min (ref >60)
GFR calc non Af Amer: >60 mL/min (ref >60)
Glucose, Bld: 151 mg/dL — ABNORMAL HIGH (ref 70–99)
Potassium: 3.4 mmol/L — ABNORMAL LOW (ref 3.5–5.1)
Sodium: 138 mmol/L (ref 135–145)

## 2018-06-19 LAB — PHOSPHORUS: Phosphorus: 3.2 mg/dL (ref 2.5–4.6)

## 2018-06-19 LAB — GLUCOSE, CAPILLARY
Glucose-Capillary: 138 mg/dL — ABNORMAL HIGH (ref 70–99)
Glucose-Capillary: 209 mg/dL — ABNORMAL HIGH (ref 70–99)
Glucose-Capillary: 247 mg/dL — ABNORMAL HIGH (ref 70–99)

## 2018-06-19 MED ORDER — HYDROCODONE-HOMATROPINE 5-1.5 MG/5ML PO SYRP
5.0000 mL | ORAL_SOLUTION | Freq: Once | ORAL | Status: AC
  Administered 2018-06-19: 5 mL via ORAL
  Filled 2018-06-19: qty 5

## 2018-06-19 MED ORDER — PANTOPRAZOLE SODIUM 40 MG PO TBEC
40.0000 mg | DELAYED_RELEASE_TABLET | Freq: Every day | ORAL | Status: DC
  Administered 2018-06-19 – 2018-06-22 (×4): 40 mg via ORAL
  Filled 2018-06-19 (×4): qty 1

## 2018-06-19 MED ORDER — MAGIC MOUTHWASH W/LIDOCAINE
5.0000 mL | Freq: Three times a day (TID) | ORAL | Status: DC
  Administered 2018-06-19 – 2018-06-22 (×3): 5 mL via ORAL
  Filled 2018-06-19 (×11): qty 5

## 2018-06-19 MED ORDER — POTASSIUM CHLORIDE CRYS ER 20 MEQ PO TBCR
40.0000 meq | EXTENDED_RELEASE_TABLET | Freq: Two times a day (BID) | ORAL | Status: AC
  Filled 2018-06-19 (×2): qty 2

## 2018-06-19 NOTE — Progress Notes (Signed)
Physical Therapy Treatment Patient Details Name: Stacy Shelton MRN: [id number] DOB: 1960/04/25 Today's Date: 06/19/2018    History of Present Illness Pt admitted with hypotension, hyperglycemia, Hgb 6., acute on chronic pancytopenia and currently undergoing chemo for metastatic breast CA.    PT Comments    Pt with improved tolerance for mobility, participating well in sitting balance activity and transfer to recliner. Pt is very anxious about mobility due to weakness and deconditioning, presenting with tachypnea when preparing for mobility. Once pt was in the chair, pt states "just put me back in the bed and shoot me" due to sacral pain. RN informed of this. PT to continue to follow acutely.     Follow Up Recommendations  SNF     Equipment Recommendations  None recommended by PT    Recommendations for Other Services OT consult     Precautions / Restrictions Precautions Precautions: Fall Restrictions Weight Bearing Restrictions: No    Mobility  Bed Mobility Overal bed mobility: Needs Assistance Bed Mobility: Sidelying to Sit;Rolling Rolling: Min assist Sidelying to sit: Mod assist;+2 for safety/equipment;+2 for physical assistance       General bed mobility comments: Min-mod assist for rolling, trunk elevation once in sidelying, scooting to EOB with use of bed pad. Verbal cuing for use of UE on handrails to roll, push to sitting.   Transfers Overall transfer level: Needs assistance Equipment used: Rolling walker (2 wheeled) Transfers: Sit to/from Stand Sit to Stand: +2 physical assistance;+2 safety/equipment;Mod assist         General transfer comment: Mod assist +2 for power up, steadying in standing. Pt with posterior leaning upon standing, stating "I am leaning back" but has difficulty correcting. Sats 95% on 6LO2 via [place].   Ambulation/Gait Ambulation/Gait assistance: Min assist;+2 physical assistance;+2 safety/equipment Gait Distance (Feet): 4 Feet Assistive  device: Rolling walker (2 wheeled) Gait Pattern/deviations: Step-to pattern;Decreased step length - right;Decreased step length - left;Shuffle;Leaning posteriorly Gait velocity: decr   General Gait Details: Min assist for steadying, postural corrections, requires max verbal encouragement to take steps forward as pt very anxious.    Stairs             Wheelchair Mobility    Modified Rankin (Stroke Patients Only)       Balance Overall balance assessment: Needs assistance Sitting-balance support: Feet supported;No upper extremity supported Sitting balance-Leahy Scale: Fair Sitting balance - Comments: sat EOB 5 minutes for tolerance to sitting, address sitting balance deficits   Standing balance support: Bilateral upper extremity supported Standing balance-Leahy Scale: Poor Standing balance comment: reliant on PT and RW support for ambulation                            Cognition Arousal/Alertness: Awake/alert Behavior During Therapy: WFL for tasks assessed/performed Overall Cognitive Status: Within Functional Limits for tasks assessed                                 General Comments: pt very anxious, stating "I might have a panic attack (about mobility)"      Exercises      General Comments        Pertinent Vitals/Pain Pain Assessment: Faces Faces Pain Scale: Hurts even more Pain Location: sacrum Pain Descriptors / Indicators: Sore;Aching Pain Intervention(s): Limited activity within patient's tolerance;Monitored during session;Repositioned    Home Living  Prior Function            PT Goals (current goals can now be found in the care plan section) Acute Rehab PT Goals Patient Stated Goal: Get up to the chair PT Goal Formulation: With patient Time For Goal Achievement: 06/27/18 Potential to Achieve Goals: Fair Progress towards PT goals: Progressing toward goals    Frequency    Min  3X/week      PT Plan Current plan remains appropriate    Co-evaluation              AM-PAC PT "6 Clicks" Mobility   Outcome Measure  Help needed turning from your back to your side while in a flat bed without using bedrails?: A Little Help needed moving from lying on your back to sitting on the side of a flat bed without using bedrails?: A Lot Help needed moving to and from a bed to a chair (including a wheelchair)?: A Lot Help needed standing up from a chair using your arms (e.g., wheelchair or bedside chair)?: A Lot Help needed to walk in hospital room?: A Lot Help needed climbing 3-5 steps with a railing? : Total 6 Click Score: 12    End of Session Equipment Utilized During Treatment: Gait belt;Oxygen Activity Tolerance: Patient tolerated treatment well;Patient limited by fatigue Patient left: in chair;with call bell/phone within reach Nurse Communication: Mobility status PT Visit Diagnosis: Unsteadiness on feet (R26.81);Muscle weakness (generalized) (M62.81);Difficulty in walking, not elsewhere classified (R26.2)     Time: 8127-5170 PT Time Calculation (min) (ACUTE ONLY): 22 min  Charges:  $Therapeutic Activity: 8-22 mins                     Lenore Moyano Conception Chancy, PT Acute Rehabilitation Services Pager [phone]  Office [phone]    Nakisha Chai D Elonda Husky 06/19/2018, 2:31 PM

## 2018-06-19 NOTE — Progress Notes (Signed)
PROGRESS NOTE    Stacy Shelton  ZOX:[id number] DOB: 01-14-1961 DOA: 06/11/2018 PCP: Robyne Peers, MD   Brief Narrative:  Stacy Shelton is a pleasant 58 year old woman with history of metastatic breast cancer who is currently undergoing chemotherapy by Dr. Ernst Spell with last session on 05/28/2018 (Taxotere, Herceptin, Perjeta). she was feeling fine until 06/11/2018 when she started having nausea, vomiting and generalized weakness and went to see her oncologist and was found to be having anemia with hemoglobin of 6.2 and acute kidney injury with creatinine of 1.63 and was hypotensive and hyperglycemic so she was sent to the emergency department for further management.  She was initially admitted under hospitalist service but due to deterioration and requirement of vasopressors, she was transferred under PCCM/intensivist care.  She received total of 3 units of PRBC and vasopressors for some time.  She then he started having fever and was diagnosed with pneumonia and was started on IV Rocephin and Zithromax.  She also developed some SVT on 06/14/2018 for which Cardiology was consulted.  She was given a dose of adenosine and she was then started on amiodarone. Transthoracic echo did not show any acute pathology.  She was transferred under hospitalist service on 06/14/2018.  Her hemoglobin dropped again and she received 1 more unit of transfusion on 06/15/2018.  Patient has been having intermittent fever since then.  Due to being a cancer patient, she qualified for HCAP so antibiotics were broadened to IV Zosyn. Now on 5 Liters of Supplemental O2 via [place].   Patient no longer nauseous and feeling better. PT recommending SNF still   Assessment & Plan:   Active Problems:   Thrombocytopenia ([place])   Uncontrolled diabetes mellitus with hyperglycemia (HCC)   Metastatic breast cancer (HCC)   Metastasis to brain ([place])   Metastasis to bone ([place])   Sepsis ([place])   Pneumonia due to infectious organism   Chronic diastolic CHF  (congestive heart failure) (HCC)   Anemia associated with chemotherapy   Chemotherapy-induced thrombocytopenia   SVT (supraventricular tachycardia) (HCC)   Cancer associated pain   Metastasis to lung (HCC)   Acute renal failure (ARF) (HCC)   Severe anemia   Pressure injury of skin   Tachycardia   Acute diastolic heart failure (HCC)  Acute Hypoxic Respiratory Failure Secondary to healthcare associated pneumonia, improving  -Due to being a cancer patient receiving active chemotherapy, she qualifies for healthcare associated pneumonia.   -Still having intermittent fever but this has improved from yesterday and TMax was 100 -Now on 5 Liters of O2 via [place] and will wean as tolerated -C/w Benzonatate and Tussionex -All cultures remain negative. Will Continue Zosyn until tomorrow -Received NS bolus on 5/18 and IV Lasix 40 mg yesterday  -Has been tested negative for COVID.   -Urine antigen for Legionella and streptococci are negative -Procalcitonin improving.  -C/w Albuterol 2.5 mg Neb q4hprn Wheezing and SOB  SVT, improved  -?  Chemotherapy-induced cardiomyopathy. -Continues to be in Sinus Rhythm. -Seen by Cardiology. -Continues to be on oral Metoprolol 12.5 mg po BID and Amiodarone 200 mg po Daily    -Monitor on telemetry.  -Echo with no changes.  Acute on Chronic Anemia -Again likely due to chemotherapy.   -Received total of 4 units of PRBC transfusion so far with the last being on 06/15/2018.   -Hemoglobin above 7 now and Hb/HCt is improved some and is 8.5/26.3  -Medical Oncology on board.  Thrombocytopenia -Again likely due to chemotherapy but now suspect worsened in  the setting of IV Zosyn.   -Platelets  Went from 13 -> 17 now.  No signs of bleeding.   -Continue to Monitor daily.   -Medical Oncology on board.  Type 2 Diabetes Mellitus -Controlled. Had lows of 67 and 68 so will Reduce Levemir from 10 units to 5 units -Will D/C Novolog with Meals -Was on Insulin gtt and now  off -C/w Moderate Novolog SSI 0-15 units TID -CBG's ranging from 140-209  Metastatic Breast Cancer -Management per Oncology Dr. Alvy Bimler -Has received 2 Cycles of Chemotherapy    History of Asthma -Not in Acute exacerbation.   -Continue Montelukast 10 mg po Daily -C/w Albuterol 2.5 mg Neb q4hprn Wheezing and SOB  Hypokalemia -In the setting of IV Lasix  -Patient's K+ was 2.5 and improved to 3.4 -Replete with po KCl 40 mEQ BID x2 -Continue to Monitor and Replete as Necessary -Repeat CMP in AM   Severe Protein Calorie Malnutrition  -Nutritionist working Remotely -C/w [employer] TID -Encourage Po Intake  -C/w Mirtazapine 15 mg po qHS  Acute on Chronic Diastolic CHF -Given IV Lasix Yesterday with improvement -Strict I's/O's and Daily Weights -Patient is +7.372 and Weight is +1 lb but not done in the last few days -Continue to Monitor for S/Sx of Volume Overload -Repeat CXR in AM   Leukocytosis -Patient's WBC went from 9.2 -> 11.5 -Currently on IV Zosyn  -Continue to Monitor and Repeat CBC in AM   DVT prophylaxis: SCDs Code Status: FULL CODE  Family Communication: No family present at bedside  Disposition Plan: Transferred to the Medical Floor now that she is improved.   Consultants:   Cardiology  Oncology  PCCM  Procedures:  ECHOCARDIOGRAM IMPRESSIONS    1. The left ventricle has normal systolic function, with an ejection fraction of 60-65%. The cavity size was normal. Left ventricular diastolic function could not be evaluated due to nondiagnostic images. No evidence of left ventricular regional wall  motion abnormalities.  2. The right ventricle has normal systolc function. The cavity was normal. There is no increase in right ventricular wall thickness.  3. Right atrial size was mildly dilated.  4. There is prominent interventricular septal bounce that is not related to respirations.  5. The aortic valve is tricuspid.  6. Pulmonic valve regurgitation  was not assessed by color flow Doppler.  7. There is significant interventricular septal bounce that is more prominent in diastole. There is a trivial posterior pericardial effusion. Differential diagnosis includes acute PE, acute pulmonary process or constrictive pericarditis. Recommend  repeat echo with respirometer outpatient once respiratory illness resolved. If still present then consider cardiac MRI to rule out contrictive pericarditis.  FINDINGS  Left Ventricle: The left ventricle has normal systolic function, with an ejection fraction of 60-65%. The cavity size was normal. There is no increase in left ventricular wall thickness. Left ventricular diastolic function could not be evaluated due to  nondiagnostic images. No evidence of left ventricular regional wall motion abnormalities.    Right Ventricle: The right ventricle has normal systolic function. The cavity was normal. There is no increase in right ventricular wall thickness.  Left Atrium: Left atrial size was normal in size.  Right Atrium: Right atrial size was mildly dilated. Right atrial pressure is estimated at 3 mmHg.  Interatrial Septum: No atrial level shunt detected by color flow Doppler.  Pericardium: There is no evidence of pericardial effusion. There is septal bounce. There is significant interventricular septal bounce that is more prominent in diastole. There  is a trivial posterior pericardial effusion. Differential diagnosis includes  acute PE, acute pulmonary process or constrictive pericarditis. Recommend repeat echo with respirometer outpatient once respiratory illness resolved. If still present then consider cardiac MRI to rule out contrictive pericarditis.  Mitral Valve: The mitral valve is normal in structure. Mitral valve regurgitation is mild by color flow Doppler.  Tricuspid Valve: The tricuspid valve was normal in structure. Tricuspid valve regurgitation is mild by color flow Doppler.  Aortic  Valve: The aortic valve is tricuspid Aortic valve regurgitation was not visualized by color flow Doppler.  Pulmonic Valve: The pulmonic valve was normal in structure. Pulmonic valve regurgitation was not assessed by color flow Doppler.  Venous: The inferior vena cava is normal in size with greater than 50% respiratory variability.    +--------------+--------++  LEFT VENTRICLE            +--------------+--------++  PLAX 2D                   +--------------+--------++  LVIDd:         4.41 cm    +--------------+--------++  LVIDs:         3.22 cm    +--------------+--------++  LV PW:         0.94 cm    +--------------+--------++  LV IVS:        0.69 cm    +--------------+--------++  LVOT diam:     1.80 cm    +--------------+--------++  LV SV:         47 ml      +--------------+--------++  LV SV Index:   28.42      +--------------+--------++  LVOT Area:     2.54 cm   +--------------+--------++                            +--------------+--------++  +---------------+---------++  RIGHT VENTRICLE             +---------------+---------++  RV Basal diam:  2.64 cm     +---------------+---------++  RVSP:           29.4 mmHg   +---------------+---------++  +---------------+-------++-----------++  LEFT ATRIUM              Index         +---------------+-------++-----------++  LA diam:        3.70 cm  2.30 cm/m    +---------------+-------++-----------++  LA Vol (A2C):   41.7 ml  25.89 ml/m   +---------------+-------++-----------++  LA Vol (A4C):   40.3 ml  25.02 ml/m   +---------------+-------++-----------++  LA Biplane Vol: 43.4 ml  26.95 ml/m   +---------------+-------++-----------++ +------------+---------++-----------++  RIGHT ATRIUM            Index         +------------+---------++-----------++  RA Pressure: 3.00 mmHg                +------------+---------++-----------++  RA Area:     16.70 cm                +------------+---------++-----------++  RA Volume:    44.70 ml   27.76 ml/m   +------------+---------++-----------++  +------------+-----------++  AORTIC VALVE               +------------+-----------++  LVOT Vmax:   138.00 cm/s   +------------+-----------++  LVOT Vmean:  92.600 cm/s   +------------+-----------++  LVOT VTI:    0.253 m       +------------+-----------++   +-------------+-------++  AORTA                   +-------------+-------++  Ao Root diam: 2.70 cm   +-------------+-------++  Ao Asc diam:  2.50 cm   +-------------+-------++  +--------------+----------++ +---------------+-----------++  MITRAL VALVE                 TRICUSPID VALVE               +--------------+----------++ +---------------+-----------++  MV Area (PHT): 4.06 cm      TR Peak grad:   26.4 mmHg     +--------------+----------++ +---------------+-----------++  MV PHT:        54.23 msec    TR Vmax:        257.00 cm/s   +--------------+----------++ +---------------+-----------++  MV Decel Time: 187 msec      Estimated RAP:  3.00 mmHg     +--------------+----------++ +---------------+-----------++ +--------------+----------++  RVSP:           29.4 mmHg      MV E velocity: 99.80 cm/s   +---------------+-----------++ +--------------+----------++  MV A velocity: 93.40 cm/s   +--------------+-------+ +--------------+----------++  SHUNTS                   MV E/A ratio:  1.07         +--------------+-------+ +--------------+----------++  Systemic VTI:  0.25 m                                +--------------+-------+                               Systemic Diam: 1.80 cm                               +--------------+-------+  Antimicrobials:  Anti-infectives (From admission, onward)   Start     Dose/Rate Route Frequency Ordered Stop   06/16/18 0900  piperacillin-tazobactam (ZOSYN) IVPB 3.375 g     3.375 g 12.5 mL/hr over 240 Minutes Intravenous Every 8 hours 06/16/18 0738     06/11/18 1700  cefTRIAXone (ROCEPHIN) 1 g in sodium chloride 0.9 % 100 mL IVPB       1 g 200 mL/hr over 30 Minutes Intravenous Every 24 hours 06/11/18 1317 06/15/18 2159   06/11/18 1600  azithromycin (ZITHROMAX) 500 mg in sodium chloride 0.9 % 250 mL IVPB     500 mg 250 mL/hr over 60 Minutes Intravenous Every 24 hours 06/11/18 1317 06/15/18 2159   06/11/18 1230  cefTRIAXone (ROCEPHIN) 1 g in sodium chloride 0.9 % 100 mL IVPB  Status:  Discontinued     1 g 200 mL/hr over 30 Minutes Intravenous  Once 06/11/18 1216 06/11/18 1506   06/11/18 1230  azithromycin (ZITHROMAX) 500 mg in sodium chloride 0.9 % 250 mL IVPB  Status:  Discontinued     500 mg 250 mL/hr over 60 Minutes Intravenous  Once 06/11/18 1216 06/11/18 1506     Subjective: Seen and examined at bedside and states that she is no longer nauseous.  States she is better.  No chest pain, lightheadedness or dizziness.  Still has dry cough. Hoping to go home instead of SNF. No other concerns or complaints at that time.   Objective: Vitals:   06/18/18 2004 06/18/18 2143 06/19/18 0609 06/19/18 1343  BP:  Marland Kitchen)  118/50 139/67 122/60  Pulse:  92 (!) 112 91  Resp: (!) 28 18 18 18   Temp:  98 F (36.7 C) 98.4 F (36.9 C) 97.8 F (36.6 C)  TempSrc:   Oral Oral  SpO2:  97% 97% 97%  Weight:      Height:        Intake/Output Summary (Last 24 hours) at 06/19/2018 1901 Last data filed at 06/19/2018 1700 Gross per 24 hour  Intake 1202.65 ml  Output 600 ml  Net 602.65 ml   Filed Weights   06/14/18 0500 06/15/18 0500 06/16/18 0500  Weight: 60.6 kg 61 kg 60.7 kg   Examination: Physical Exam:  Constitutional: Chronically ill-appearing Caucasian female currently no acute distress appears calm. Eyes: Lids and conjunctive are normal.  Sclera anicteric. ENMT: External ears and nose appear normal.  Grossly normal hearing Neck: Appears supple with no JVD Respiratory: Diminished auscultation bilaterally with coarse breath sounds.  No appreciable wheezing, rales, rhonchi.  Normal respiratory effort.  Wearing 5 L supplemental  oxygen via nasal cannula Cardiovascular: Regular rate and rhythm.  Has a slight 2/6 murmur.  Trace extremity edema Abdomen: Soft, nontender, nondistended.  Bowel sounds present. GU: Deferred Musculoskeletal: No clubbing or cyanosis.  Has a Port-A-Cath in place  Skin: No appreciable rashes or lesions on limited skin evaluation. Neurologic: Cranial nerves II through XII gross intact appreciable focal deficit. Psychiatric: Patient is agitated slightly.  Has intact judgment intact.  She is awake, alert, oriented  Data Reviewed: I have personally reviewed following labs and imaging studies  CBC: Recent Labs  Lab 06/13/18 0146  06/14/18 0206  06/15/18 0500 06/15/18 1934 06/16/18 0232 06/17/18 1137 06/18/18 0616 06/19/18 0428  WBC 9.7  --  14.9*  --  8.3  --  8.3 9.4 9.2 11.5*  NEUTROABS 5.7  --  8.1*  --   --   --   --  6.4 6.3 7.9*  HGB 8.2*   < > 8.5*   < > 6.1* 8.0* 8.3* 7.6* 7.3* 8.5*  HCT 25.0*   < > 27.4*   < > 19.4* 24.8* 25.6* 24.6* 23.8* 26.3*  MCV 86.5  --  89.5  --  90.7  --  88.0 91.1 90.8 89.8  PLT 36*  --  36*  --  22*  --  21* 18* 13* 17*   < > = values in this interval not displayed.   Basic Metabolic Panel: Recent Labs  Lab 06/14/18 0206 06/15/18 0500 06/16/18 0232 06/17/18 0416 06/18/18 0616 06/19/18 0428  NA 138 139 136  --  138 138  K 3.9 3.7 3.5  --  2.5* 3.4*  CL 101 102 98  --  100 101  CO2 27 28 28   --  29 29  GLUCOSE 85 76 158*  --  122* 151*  BUN 21* 17 13  --  10 12  CREATININE 0.68 0.63 0.55  --  0.47 0.44  CALCIUM 7.9* 7.6* 7.6*  --  7.8* 8.1*  MG 1.9 1.8 1.7 1.4* 1.7 2.0  PHOS  --   --   --   --  2.6 3.2   GFR: Estimated Creatinine Clearance: 65.7 mL/min (by C-G formula based on SCr of 0.44 mg/dL). Liver Function Tests: Recent Labs  Lab 06/13/18 0146 06/14/18 0206 06/18/18 0616  AST 16 26 16   ALT 11 16 12   ALKPHOS 96 180* 156*  BILITOT 1.3* 1.6* 1.5*  PROT 5.6* 6.0* 5.0*  ALBUMIN 1.8* 1.9* 1.3*  No results for input(s): LIPASE,  AMYLASE in the last 168 hours. No results for input(s): AMMONIA in the last 168 hours. Coagulation Profile: No results for input(s): INR, PROTIME in the last 168 hours. Cardiac Enzymes: No results for input(s): CKTOTAL, CKMB, CKMBINDEX, TROPONINI in the last 168 hours. BNP (last 3 results) No results for input(s): PROBNP in the last 8760 hours. HbA1C: No results for input(s): HGBA1C in the last 72 hours. CBG: Recent Labs  Lab 06/18/18 1220 06/18/18 1729 06/18/18 2141 06/19/18 0800 06/19/18 1204  GLUCAP 113* 141* 140* 138* 209*   Lipid Profile: No results for input(s): CHOL, HDL, LDLCALC, TRIG, CHOLHDL, LDLDIRECT in the last 72 hours. Thyroid Function Tests: No results for input(s): TSH, T4TOTAL, FREET4, T3FREE, THYROIDAB in the last 72 hours. Anemia Panel: No results for input(s): VITAMINB12, FOLATE, FERRITIN, TIBC, IRON, RETICCTPCT in the last 72 hours. Sepsis Labs: Recent Labs  Lab 06/13/18 0146 06/16/18 0530  PROCALCITON 18.35 6.35    Recent Results (from the past 240 hour(s))  Blood Culture (routine x 2)     Status: None   Collection Time: 06/11/18 10:55 AM  Result Value Ref Range Status   Specimen Description   Final    BLOOD LEFT ARM Performed at [facility], [address], [place], [place] [address]    Special Requests   Final    BOTTLES DRAWN AEROBIC AND ANAEROBIC Blood Culture adequate volume Performed at [facility] [address] [place], [place] [address]    Culture   Final    NO GROWTH 5 DAYS Performed at [facility], [place] [address] [place], [place] [address]    Report Status 06/16/2018 FINAL  Final  Urine culture     Status: Abnormal   Collection Time: 06/11/18 10:55 AM  Result Value Ref Range Status   Specimen Description   Final    Urine Performed at [facility] [address], [place], [place] [address]    Special Requests   Final    Immunocompromised Performed at [facility], [place] [address] [place], [place] [address]    Culture (A)  Final    <10,000 COLONIES/mL INSIGNIFICANT GROWTH Performed at [facility] [address] [place], [place] [address]    Report Status 06/14/2018 FINAL  Final  Blood Culture (routine x 2)     Status: None   Collection Time: 06/11/18 11:00 AM  Result Value Ref Range Status   Specimen Description   Final    BLOOD CHEST RIGHT Performed at [facility] [address] [place], [place] [address]    Special Requests   Final    BOTTLES DRAWN AEROBIC AND ANAEROBIC Blood Culture adequate volume Performed at [facility] [address] [place], [place] [address]    Culture   Final    NO GROWTH 5 DAYS Performed at [facility], [place] [address] [place], [place] [address]    Report Status 06/16/2018 FINAL  Final  SARS Coronavirus 2 (CEPHEID - Performed in [facility] hospital lab), Hosp Order     Status: None   Collection Time: 06/11/18 11:50 AM  Result Value Ref Range Status   SARS Coronavirus 2 NEGATIVE NEGATIVE Final    Comment: (NOTE) If result is NEGATIVE SARS-CoV-2 target nucleic acids are NOT DETECTED. The SARS-CoV-2 RNA is generally detectable in upper and lower  respiratory specimens during the acute phase of infection. The lowest  concentration of SARS-CoV-2 viral copies this assay can detect is 250  copies / mL. A negative result does not preclude SARS-CoV-2 infection  and should not be used as the sole basis for treatment or other  patient management decisions.  A negative result may occur with  improper specimen collection / handling, submission of specimen other  than nasopharyngeal swab, presence of viral mutation(s) within the  areas targeted by this assay, and inadequate number of viral copies  (<250 copies / mL). A negative result must be combined with clinical  observations, patient history, and epidemiological information. If result is  POSITIVE SARS-CoV-2 target nucleic acids are DETECTED. The SARS-CoV-2 RNA is generally detectable in upper and lower  respiratory specimens dur ing the acute phase of infection.  Positive  results are indicative of active infection with SARS-CoV-2.  Clinical  correlation with patient history and other diagnostic information is  necessary to determine patient infection status.  Positive results do  not rule out bacterial infection or co-infection with other viruses. If result is PRESUMPTIVE POSTIVE SARS-CoV-2 nucleic acids MAY BE PRESENT.   A presumptive positive result was obtained on the submitted specimen  and confirmed on repeat testing.  While 2019 novel coronavirus  (SARS-CoV-2) nucleic acids may be present in the submitted sample  additional confirmatory testing may be necessary for epidemiological  and / or clinical management purposes  to differentiate between  SARS-CoV-2 and other Sarbecovirus currently known to infect humans.  If clinically indicated additional testing with an alternate test  methodology [phone]) is advised. The SARS-CoV-2 RNA is generally  detectable in upper and lower respiratory sp ecimens during the acute  phase of infection. The expected result is Negative. Fact Sheet for Patients:  StrictlyIdeas.no Fact Sheet for Healthcare Providers: BankingDealers.co.za This test is not yet approved or cleared by the [place] FDA and has been authorized for detection and/or diagnosis of SARS-CoV-2 by FDA under an Emergency Use Authorization (EUA).  This EUA will remain in effect (meaning this test can be used) for the duration of the COVID-19 declaration under Section 564(b)(1) of the Act, 21 U.S.C. section 360bbb-3(b)(1), unless the authorization is terminated or revoked sooner. Performed at [facility], [place] [address], [place], [place] [address]   MRSA PCR Screening     Status: None    Collection Time: 06/11/18  3:57 PM  Result Value Ref Range Status   MRSA by PCR NEGATIVE NEGATIVE Final    Comment:        The GeneXpert MRSA Assay (FDA approved for NASAL specimens only), is one component of a comprehensive MRSA colonization surveillance program. It is not intended to diagnose MRSA infection nor to guide or monitor treatment for MRSA infections. Performed at [facility], [place] [address], [place], [place] [address]      RN Pressure Injury Documentation: Pressure Injury 06/11/18 Deep Tissue Injury - Purple or maroon localized area of discolored intact skin or blood-filled blister due to damage of underlying soft tissue from pressure and/or shear. (Active)  06/11/18 1900  Location: Sacrum  Location Orientation: Mid  Staging: Deep Tissue Injury - Purple or maroon localized area of discolored intact skin or blood-filled blister due to damage of underlying soft tissue from pressure and/or shear.  Wound Description (Comments):   Present on Admission: Yes     Pressure Injury 06/12/18 Deep Tissue Injury - Purple or maroon localized area of discolored intact skin or blood-filled blister due to damage of underlying soft tissue  from pressure and/or shear. Deep purple and pink (Active)  06/12/18 1600  Location: Heel  Location Orientation: Right  Staging: Deep Tissue Injury - Purple or maroon localized area of discolored intact skin or blood-filled blister due to damage of underlying soft tissue from pressure and/or shear.  Wound Description (Comments): Deep purple and pink  Present on Admission: Yes     Estimated body mass index is 24.48 kg/m as calculated from the following:   Height as of this encounter: 5\' 2"  (1.575 m).   Weight as of this encounter: 60.7 kg.  Malnutrition Type:  Nutrition Problem: Increased nutrient needs Etiology: chronic illness, cancer and cancer related treatments   Malnutrition Characteristics:  Signs/Symptoms:  estimated needs   Nutrition Interventions:  Interventions: Ensure Enlive (each supplement provides 350kcal and 20 grams of protein), MVI  Radiology Studies: Dg Chest Port 1 View  Result Date: 06/19/2018 CLINICAL DATA:  History of metastatic breast cancer. EXAM: PORTABLE CHEST 1 VIEW COMPARISON:  Jun 17, 2018 FINDINGS: Right Port-A-Cath is stable. Bilateral pleural effusions, right greater than left with underlying atelectasis/consolidation. Cardiomediastinal silhouette is stable. IMPRESSION: No interval change in bilateral pleural effusions with underlying atelectasis/consolidation. Stable right Port-A-Cath. Electronically Signed   By: Dorise Bullion III M.D   On: 06/19/2018 10:10   Scheduled Meds:  sodium chloride   Intravenous Once   amiodarone  200 mg Oral Daily   Chlorhexidine Gluconate Cloth  6 each Topical Daily   feeding supplement (ENSURE ENLIVE)  237 mL Oral TID BM   insulin aspart  0-15 Units Subcutaneous TID WC   insulin detemir  5 Units Subcutaneous QHS   magic mouthwash w/lidocaine  5 mL Oral TID   mouth rinse  15 mL Mouth Rinse BID   metoprolol tartrate  12.5 mg Oral BID   mirtazapine  15 mg Oral QHS   montelukast  10 mg Oral Daily   multivitamin with minerals  1 tablet Oral Daily   pantoprazole  40 mg Oral Daily   potassium chloride  40 mEq Oral BID   sodium chloride flush  10-40 mL Intracatheter Q12H   Continuous Infusions:  lactated ringers 10 mL/hr at 06/14/18 1200   piperacillin-tazobactam 3.375 g (06/19/18 1317)    LOS: 8 days   Kerney Elbe, DO [facility] PAGER is on [place]  If 7PM-7AM, please contact night-coverage [email or web address] Password [email or web address] 06/19/2018, 7:01 PM

## 2018-06-19 NOTE — Progress Notes (Signed)
Pt appears jaundiced.  Refusing meds and some treatments.  States just shoot me.  Difficult to position for comfort.  Instructed to ask for pain med prior to #8.

## 2018-06-19 NOTE — Progress Notes (Signed)
Patient has been coughing a lot. Had medication this shift.  PCP was notified for a new order.

## 2018-06-19 NOTE — Progress Notes (Signed)
Physical Therapy Treatment Patient Details Name: Stacy Shelton MRN: [id number] DOB: [birth year] Today's Date: 06/19/2018    History of Present Illness Pt admitted with hypotension, hyperglycemia, Hgb 6., acute on chronic pancytopenia and currently undergoing chemo for metastatic breast CA.    PT Comments    ~45 minutes after PT session, pt required PT assist for return to bed. Pt required mod assist +2 for standing and steadying for transfer, pt with lack of ability to correct posterior leaning without PT assist. VSS during transfer. PT to continue to follow.     Follow Up Recommendations  SNF     Equipment Recommendations  None recommended by PT    Recommendations for Other Services OT consult     Precautions / Restrictions Precautions Precautions: Fall Restrictions Weight Bearing Restrictions: No    Mobility  Bed Mobility Overal bed mobility: Needs Assistance Bed Mobility: Sit to Supine Rolling: Min assist Sidelying to sit: Mod assist;+2 for safety/equipment;+2 for physical assistance   Sit to supine: Mod assist;HOB elevated;+2 for physical assistance;+2 for safety/equipment   General bed mobility comments: Mod assist +2 for sit to supine for LE lifting, trunk lowering, scooting pt up in bed with use of bed pad.   Transfers Overall transfer level: Needs assistance Equipment used: Rolling walker (2 wheeled) Transfers: Sit to/from Stand Sit to Stand: +2 physical assistance;+2 safety/equipment;Mod assist         General transfer comment: Mod assist +2 for power up, steadying in standing. Pt again with posterior leaning, very fatigued so took short steps to get to EOB and sat without control.  Ambulation/Gait Ambulation/Gait assistance: Min assist;+2 physical assistance;+2 safety/equipment Gait Distance (Feet): 4 Feet Assistive device: Rolling walker (2 wheeled) Gait Pattern/deviations: Step-to pattern;Decreased step length - right;Decreased step length -  left;Shuffle;Leaning posteriorly Gait velocity: decr   General Gait Details: Min assist for steadying, postural corrections, requires max verbal encouragement to take steps forward as pt very anxious.    Stairs             Wheelchair Mobility    Modified Rankin (Stroke Patients Only)       Balance Overall balance assessment: Needs assistance Sitting-balance support: Feet supported;No upper extremity supported Sitting balance-Leahy Scale: Fair Sitting balance - Comments: sat EOB 5 minutes for tolerance to sitting, address sitting balance deficits   Standing balance support: Bilateral upper extremity supported Standing balance-Leahy Scale: Poor Standing balance comment: reliant on PT and RW support for ambulation                            Cognition Arousal/Alertness: Awake/alert Behavior During Therapy: WFL for tasks assessed/performed;Anxious Overall Cognitive Status: Within Functional Limits for tasks assessed                                 General Comments: pt very anxious, stating "I might have a panic attack (about mobility)"      Exercises      General Comments        Pertinent Vitals/Pain Pain Assessment: Faces Faces Pain Scale: Hurts even more Pain Location: sacrum Pain Descriptors / Indicators: Sore;Aching Pain Intervention(s): Limited activity within patient's tolerance;Monitored during session;Repositioned    Home Living                      Prior Function  PT Goals (current goals can now be found in the care plan section) Acute Rehab PT Goals Patient Stated Goal: Get up to the chair PT Goal Formulation: With patient Time For Goal Achievement: 06/27/18 Potential to Achieve Goals: Fair Progress towards PT goals: Progressing toward goals    Frequency    Min 3X/week      PT Plan Current plan remains appropriate    Co-evaluation              AM-PAC PT "6 Clicks" Mobility    Outcome Measure  Help needed turning from your back to your side while in a flat bed without using bedrails?: A Little Help needed moving from lying on your back to sitting on the side of a flat bed without using bedrails?: A Lot Help needed moving to and from a bed to a chair (including a wheelchair)?: A Lot Help needed standing up from a chair using your arms (e.g., wheelchair or bedside chair)?: A Lot Help needed to walk in hospital room?: A Lot Help needed climbing 3-5 steps with a railing? : Total 6 Click Score: 12    End of Session Equipment Utilized During Treatment: Gait belt;Oxygen Activity Tolerance: Patient limited by fatigue Patient left: in bed;with nursing/sitter in room Nurse Communication: Mobility status PT Visit Diagnosis: Unsteadiness on feet (R26.81);Muscle weakness (generalized) (M62.81);Difficulty in walking, not elsewhere classified (R26.2)     Time: 1962-2297 PT Time Calculation (min) (ACUTE ONLY): 10 min  Charges:  $Therapeutic Activity: 8-22 mins                     Curry Dulski Conception Chancy, PT Acute Rehabilitation Services Pager [phone]  Office [phone]    Latysha Thackston D Elonda Husky 06/19/2018, 2:45 PM

## 2018-06-19 NOTE — Progress Notes (Signed)
Stacy Shelton   DOB:03/28/1960   JE#:[id number]    ASSESSMENT & PLAN:  Metastatic breast cancer She has received 2 cycles of chemotherapy so far with improved disease control I am not concerned about mild changes noted on her CT chest in regards to the lymph nodes and lung nodule.Liver metastasis is shrinking Continue aggressive supportive care  SVT, hypotensive, resolved ECHOshowed no evidence of cardiomyopathy. The restrictiveheart movement is likely due to consolidation of the lungs noted on her CT imaging  Acute renal failure, resolved This was secondary to recent nausea, vomiting and dehydration. Observe closely for now  Chronic pancytopenia She had acute on chronic pancytopenia due to recent bone marrow suppression from chemotherapy She has received 2 units of blood transfusionon 06/11/2018, 1 unit on 06/15/2018 and another unit on 06/18/2018 She does not need platelet transfusion unless signs of bleeding or platelet count less than 10,000 Continue close monitoring daily.  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  Her fever is resolved.  Chest x-ray is reviewed which show possibly mild congestion/overload.  She had receiving Lasix for diuresis and a touch of morphine for anxiety on 06/17/2018 with  improvement Today is day 9 of antibiotics; hopefully this can be stopped either today or tomorrow She will continue to work with physical therapy  Clinical depression, stable She has clinical signs of depression, verified and confirmed by family members I have started her on Remeron that should help with stabilizing her mood I will continue to check on her and provide emotional support Once she is more stable, we will consult psychiatrist to follow  Sacral decubitus and heel ulcers Consult wound care  Profound weakness, critical care myopathy Continue PT; patient wants to go home PT rather than SNF  Loose stool I will DC laxatives  Hypokalemia Replete per primary team  Code Status Full code  Goals of care Aggressive supportive care. Hopefully, she can be moved discharged next few days  Discharge planning I will continue to check on her later today I will call to update her sister, [name]  All questions were answered. The patient knows to call the clinic with any problems, questions or concerns.   [name], MD 06/19/2018 8:35 AM  Subjective:  She was afebrile She continues to have cough Her shortness of breath is stable She is concerned because she is weak The patient denies any recent signs or symptoms of bleeding such as spontaneous epistaxis, hematuria or hematochezia. She has some mild loose stool but no diarrhea  Objective:  Vitals:   06/18/18 2143 06/19/18 0609  BP: (!) 118/50 139/67  Pulse: 92 (!) 112  Resp: 18 18  Temp: 98 F (36.7 C) 98.4 F (36.9 C)  SpO2: 97% 97%     Intake/Output Summary (Last 24 hours) at 06/19/2018 0835 Last data filed at 06/19/2018 6378 Gross per 24 hour  Intake 1285.15 ml  Output 1000 ml  Net 285.15 ml    GENERAL:alert, no distress and comfortable SKIN: skin color, texture, turgor are normal, no rashes or significant lesions. Noted multiple pads around her ankles EYES: normal, Conjunctiva are pale and non-injected,  sclera clear OROPHARYNX:no exudate, no erythema and lips, buccal mucosa, and tongue normal  NECK: supple, thyroid normal size, non-tender, without nodularity LYMPH:  no palpable lymphadenopathy in the cervical, axillary or inguinal LUNGS: clear to auscultation and percussion with mildly increased breathing effort HEART: mild tachycardia no murmurs and, mild lower extremity edema ABDOMEN:abdomen soft, non-tender and normal bowel sounds Musculoskeletal:no cyanosis of digits and no clubbing  NEURO: alert & oriented x 3 with fluent speech, no focal motor/sensory deficits   Labs:  Recent Labs    05/06/18 0430  06/13/18 0146  06/14/18 0206  06/16/18 0232 06/18/18 0616 06/19/18 0428  NA 138   < > 137   < > 138   < > 136 138 138  K 3.6   < > 2.9*   < > 3.9   < > 3.5 2.5* 3.4*  CL 99   < > 102   < > 101   < > 98 100 101  CO2 32   < > 26   < > 27   < > 28 29 29   GLUCOSE 138*   < > 165*   < > 85   < > 158* 122* 151*  BUN 14   < > 23*   < > 21*   < > 13 10 12   CREATININE 0.54   < > 0.71   < > 0.68   < > 0.55 0.47 0.44  CALCIUM 7.8*   < > 7.7*   < > 7.9*   < > 7.6* 7.8* 8.1*  GFRNONAA >60   < > >60   < > >60   < > >60 >60 >60  GFRAA >60   < > >60   < > >60   < > >60 >60 >60  PROT 5.4*   < > 5.6*  --  6.0*  --   --  5.0*  --   ALBUMIN 1.7*   < > 1.8*  --  1.9*  --   --  1.3*  --   AST 23   < > 16  --  26  --   --  16  --   ALT 14   < > 11  --  16  --   --  12  --   ALKPHOS 372*   < > 96  --  180*  --   --  156*  --   BILITOT 2.0*   < > 1.3*  --  1.6*  --   --  1.5*  --   BILIDIR 1.0*  --   --   --   --   --   --   --   --   IBILI 1.0*  --   --   --   --   --   --   --   --    < > = values in this interval not displayed.    Studies:  Ct Angio Chest Pe W Or Wo Contrast  Result Date: 06/11/2018 CLINICAL DATA:  Shortness of breath. Metastatic breast cancer. EXAM: CT ANGIOGRAPHY CHEST WITH CONTRAST TECHNIQUE: Multidetector CT imaging of the chest was performed using the standard protocol  during bolus administration of intravenous contrast. Multiplanar CT image reconstructions and MIPs were obtained  to evaluate the vascular anatomy. CONTRAST:  53mL OMNIPAQUE IOHEXOL 350 MG/ML SOLN COMPARISON:  Chest radiograph 06/11/2018. Thoracic spine CT 05/19/2018. Chest CTA 04/27/2018. FINDINGS: Cardiovascular: Pulmonary arterial opacification is adequate without evidence of lobar more proximal emboli. No segmental emboli are identified although assessment is limited in some areas due to motion artifact. Mild thoracic aortic atherosclerosis is noted without aneurysm. The heart is enlarged. There is no pericardial effusion. A right jugular Port-A-Cath terminates in the high right atrium. Mediastinum/Nodes: Calcified subcarinal and right hilar lymph nodes are again noted. Mildly enlarged noncalcified lymph nodes including a 10 mm short axis right paratracheal lymph node, levin mm precarinal lymph node, an 11 mm right hilar lymph node are unchanged from the prior chest CTA. The esophagus and thyroid are unremarkable. Lungs/Pleura: There are small bilateral pleural effusions, unchanged on the right and decreased in size on the left compared to the prior chest CTA. Extensive right lower lobe consolidation has greatly progressed from the prior chest CTA with progression also noted compared to the more recent thoracic spine CT. There is mildly improved aeration of the left lower lobe compared to the prior chest CTA, however moderately extensive consolidation and volume loss remain. A 2.5 x 2.2 cm right upper lobe nodule is unchanged from the thoracic spine CT though has enlarged from the chest CTA (series 10, image 51), as has an 8 mm nodule more anterolaterally in the right upper lobe (series 10, image 50). Multiple subcentimeter nodules are present in the left upper lobe with some being larger compared to the prior chest CT and others being unchanged. Upper Abdomen: Liver lesions have decreased in size, for example a  segment VII/VIII lesion now measures 16 mm (series 4, image 85, previously 23 mm). Calcified granulomas are noted in the spleen. Musculoskeletal: Unchanged subcentimeter sclerotic foci in the T11 and T12 vertebral bodies, left glenoid, and inferior right scapular body. Review of the MIP images confirms the above findings. IMPRESSION: 1. No evidence of pulmonary emboli. 2. Extensive bilateral lower lobe consolidation. 3. Small pleural effusions. 4. 2.5 cm right upper lobe nodule, enlarged from 04/27/2018 and which may reflect a metastasis or primary lung cancer. Stable to mild enlargement of subcentimeter nodules in both upper lobes consistent with known metastases. 5. Decreased size of liver metastases. 6. Unchanged mild mediastinal and right hilar lymphadenopathy. 7. Aortic Atherosclerosis (ICD10-I70.0). Electronically Signed   By: Logan Bores M.D.   On: 06/11/2018 17:02   Dg Chest Port 1 View  Result Date: 06/17/2018 CLINICAL DATA:  58 year old female with respiratory distress EXAM: PORTABLE CHEST 1 VIEW COMPARISON:  06/16/2018, 06/12/2018, CT 06/11/2018 FINDINGS: Cardiomediastinal silhouette likely unchanged in size and contour. Heart borders partially obscured by overlying lung and pleural disease. Opacities at the bilateral lung bases obscuring the hemidiaphragms and heart borders, appears unchanged from prior. Graded opacities towards the bases. Right hilar opacity persists. Partially calcified nodes at the right hilum. No pneumothorax. Right IJ port catheter. No displaced fracture IMPRESSION: Similar appearance of the chest x-ray, with bilateral right greater than left pleural effusions and associated atelectasis/consolidation. Right hilar opacities better seen on recent CT imaging. Unchanged right IJ port catheter. Electronically Signed   By: Corrie Mckusick D.O.   On: 06/17/2018 08:05   Dg Chest Port 1 View  Result Date: 06/16/2018 CLINICAL DATA:  Metastatic breast cancer pneumonia EXAM: PORTABLE  CHEST 1 VIEW COMPARISON:  06/12/2018 FINDINGS: Right-sided Port-A-Cath with the tip projecting over the SVC. Small right pleural effusion. Bilateral lower lobe airspace  disease most concerning for pneumonia. Persistent right upper lobe pulmonary nodule consistent malignancy. No pneumothorax. Stable cardiomediastinal silhouette. No aggressive osseous lesion. IMPRESSION: 1. Bilateral lower lobe airspace disease most consistent with pneumonia. Small right pleural effusion. 2. Persistent right upper lobe pulmonary nodule consistent with malignancy. Electronically Signed   By: Kathreen Devoid   On: 06/16/2018 08:49   Dg Chest Port 1 View  Result Date: 06/12/2018 CLINICAL DATA:  58 year old female with possible pneumonia in cancer patient. EXAM: PORTABLE CHEST 1 VIEW COMPARISON:  06/11/2018 and earlier. FINDINGS: Portable AP semi upright view at 0943 hours. Stable right chest power port, accessed. Increasing confluence of mid and lower lung opacity. Consolidation demonstrated by CT yesterday. Trace pleural fluid more apparent by CT. No superimposed pneumothorax or pulmonary edema. Stable cardiac size and mediastinal contours. Visualized tracheal air column is within normal limits. IMPRESSION: Mild radiographic progression of bilateral consolidation/pneumonia since yesterday. Electronically Signed   By: Genevie Ann M.D.   On: 06/12/2018 10:37   Dg Chest Port 1 View  Result Date: 06/11/2018 CLINICAL DATA:  Hypotension. EXAM: PORTABLE CHEST 1 VIEW COMPARISON:  Radiograph of May 19, 2018. FINDINGS: Stable cardiomediastinal silhouette. Right internal jugular Port-A-Cath is unchanged in position. No pneumothorax is noted. Increased bibasilar opacities are noted concerning for pneumonia or edema. Small pleural effusions may be present. Bony thorax is unremarkable. IMPRESSION: Increased bibasilar opacities are noted concerning for worsening pneumonia or edema. Small pleural effusions may be present. Electronically Signed   By:  Marijo Conception M.D.   On: 06/11/2018 11:43

## 2018-06-19 NOTE — Progress Notes (Signed)
Patient declined drsg change at this time to add biopatch. Fran Lowes, RN VAST

## 2018-06-19 NOTE — Consult Note (Addendum)
[facility] Nurse wound consult note Reason for Consult: Consult requested for sacrum and right heel.  Pt was noted to have deep tissue pressure injuries to both locations according to the nursing flowsheet.   Wound type: According to the nursing flowsheet, right heel is .5X.5cm, dark purple intact skin.  Pt is reported to be demonstrating he initial stages of foot drop.  According to the nursing flowsheet, the sacrum is dark purple black skin These pressure injuries were noted as present on admission. Dressing procedure/placement/frequency: Float heels ; use Prevalon boots to reduce pressure and reduce potential foot drop occurrence. Foam dressings to protect from further injury. [place] team is working remotely today and will plan to assess the wound appearance tomorrow, 5/22, and revise topical treatment at that time if necessary. Julien Girt MSN, RN, [place], [place], [place]

## 2018-06-20 ENCOUNTER — Inpatient Hospital Stay (HOSPITAL_COMMUNITY): Payer: BLUE CROSS/BLUE SHIELD

## 2018-06-20 LAB — CBC WITH DIFFERENTIAL/PLATELET
Abs Immature Granulocytes: 0.4 10*3/uL — ABNORMAL HIGH (ref 0.00–0.07)
Band Neutrophils: 5 %
Basophils Absolute: 0.3 10*3/uL — ABNORMAL HIGH (ref 0.0–0.1)
Basophils Relative: 2 %
Eosinophils Absolute: 0.3 10*3/uL (ref 0.0–0.5)
Eosinophils Relative: 2 %
HCT: 27.5 % — ABNORMAL LOW (ref 36.0–46.0)
Hemoglobin: 8.4 g/dL — ABNORMAL LOW (ref 12.0–15.0)
Lymphocytes Relative: 8 %
Lymphs Abs: 1.1 10*3/uL (ref 0.7–4.0)
MCH: 27.8 pg (ref 26.0–34.0)
MCHC: 30.5 g/dL (ref 30.0–36.0)
MCV: 91.1 fL (ref 80.0–100.0)
Metamyelocytes Relative: 3 %
Monocytes Absolute: 0 10*3/uL — ABNORMAL LOW (ref 0.1–1.0)
Monocytes Relative: 0 %
Neutro Abs: 11.7 10*3/uL — ABNORMAL HIGH (ref 1.7–7.7)
Neutrophils Relative %: 80 %
Platelets: 20 10*3/uL — CL (ref 150–400)
RBC: 3.02 MIL/uL — ABNORMAL LOW (ref 3.87–5.11)
RDW: 16.3 % — ABNORMAL HIGH (ref 11.5–15.5)
WBC: 13.8 10*3/uL — ABNORMAL HIGH (ref 4.0–10.5)
nRBC: 0 % (ref 0.0–0.2)

## 2018-06-20 LAB — COMPREHENSIVE METABOLIC PANEL
ALT: 15 U/L (ref 0–44)
AST: 22 U/L (ref 15–41)
Albumin: 1.3 g/dL — ABNORMAL LOW (ref 3.5–5.0)
Alkaline Phosphatase: 299 U/L — ABNORMAL HIGH (ref 38–126)
Anion gap: 7 (ref 5–15)
BUN: 10 mg/dL (ref 6–20)
CO2: 31 mmol/L (ref 22–32)
Calcium: 8.2 mg/dL — ABNORMAL LOW (ref 8.9–10.3)
Chloride: 100 mmol/L (ref 98–111)
Creatinine, Ser: 0.41 mg/dL — ABNORMAL LOW (ref 0.44–1.00)
GFR calc Af Amer: >60 mL/min (ref >60)
GFR calc non Af Amer: >60 mL/min (ref >60)
Glucose, Bld: 173 mg/dL — ABNORMAL HIGH (ref 70–99)
Potassium: 3.1 mmol/L — ABNORMAL LOW (ref 3.5–5.1)
Sodium: 138 mmol/L (ref 135–145)
Total Bilirubin: 1.8 mg/dL — ABNORMAL HIGH (ref 0.3–1.2)
Total Protein: 5.3 g/dL — ABNORMAL LOW (ref 6.5–8.1)

## 2018-06-20 LAB — GLUCOSE, CAPILLARY
Glucose-Capillary: 136 mg/dL — ABNORMAL HIGH (ref 70–99)
Glucose-Capillary: 151 mg/dL — ABNORMAL HIGH (ref 70–99)
Glucose-Capillary: 166 mg/dL — ABNORMAL HIGH (ref 70–99)

## 2018-06-20 LAB — PHOSPHORUS: Phosphorus: 3.2 mg/dL (ref 2.5–4.6)

## 2018-06-20 LAB — MAGNESIUM: Magnesium: 1.8 mg/dL (ref 1.7–2.4)

## 2018-06-20 MED ORDER — POTASSIUM CHLORIDE CRYS ER 20 MEQ PO TBCR
40.0000 meq | EXTENDED_RELEASE_TABLET | Freq: Two times a day (BID) | ORAL | Status: AC
  Administered 2018-06-20 (×2): 40 meq via ORAL
  Filled 2018-06-20 (×2): qty 2

## 2018-06-20 MED ORDER — FUROSEMIDE 10 MG/ML IJ SOLN
40.0000 mg | Freq: Once | INTRAMUSCULAR | Status: AC
  Administered 2018-06-20: 40 mg via INTRAVENOUS
  Filled 2018-06-20: qty 4

## 2018-06-20 NOTE — TOC Progression Note (Signed)
Transition of Care [facility]) - Progression Note    Patient Details  Name: Stacy Shelton MRN: [id number] Date of Birth: [birth year]  Transition of Care [facility]) CM/SW [place], LCSW Phone Number: 06/20/2018, 1:43 PM  Clinical Narrative:  CSW spoke with patients sister(Lisa) via phone. [name] wanted to speak to CSW about concerns she has with patients discharge. [name] stated she would like patien tot have 24/7 care at home but patients lives with elderly mom. CSW encouraged [name] to research private duty help to have in the home with patient. CSW gave family directions on how to research for private duty help. [name] stated she will look into having a 24 hour caregiver for patient. [name] stated she would also like to be apart of the discharge plan once patient is medically stable for discharge so she understands the plan of care for patient.  [name] stated she would also like some mental health counseling resources to assist patient with her depression. CSW stated she will gladly print out some resources for patient.  [name] and brother in law stated they are also wanting patient to allow them to assist her with decisions. CSW stated to family that she can speak to MD about there concerns in regards to patient needing a POA. CSW will continue to follow for support     Expected Discharge Plan: [place] Barriers to Discharge: Continued Medical Work up  Expected Discharge Plan and Services Expected Discharge Plan: [place] arrangements for the past 2 months: Single Family Home Expected Discharge Date: (unknown)                                     Social Determinants of Health (SDOH) Interventions    Readmission Risk Interventions Readmission Risk Prevention Plan 06/13/2018 04/25/2018 04/25/2018  Transportation Screening Complete - Complete  Medication Review Press photographer) Complete - Complete  PCP or Specialist appointment  within 3-5 days of discharge Not Complete (No Data) Complete  PCP/Specialist Appt Not Complete comments not close to discharge - -  [place] or Home Care Consult Complete - Complete  SW Recovery Care/Counseling Consult Not Complete - Complete  SW Consult Not Complete Comments no evidence of need at this time - -  Palliative Care Screening Not Applicable - Not [place] Not Applicable - Not Applicable  Some recent data might be hidden

## 2018-06-20 NOTE — Progress Notes (Signed)
PROGRESS NOTE    Stacy Shelton  VOH:[id number] DOB: Feb 23, 1960 DOA: 06/11/2018 PCP: Robyne Peers, MD   Brief Narrative:  Stacy Shelton is a pleasant 58 year old woman with history of metastatic breast cancer who is currently undergoing chemotherapy by Dr. Ernst Spell with last session on 05/28/2018 (Taxotere, Herceptin, Perjeta). she was feeling fine until 06/11/2018 when she started having nausea, vomiting and generalized weakness and went to see her oncologist and was found to be having anemia with hemoglobin of 6.2 and acute kidney injury with creatinine of 1.63 and was hypotensive and hyperglycemic so she was sent to the emergency department for further management.  She was initially admitted under hospitalist service but due to deterioration and requirement of vasopressors, she was transferred under PCCM/intensivist care.  She received total of 3 units of PRBC and vasopressors for some time.  She then he started having fever and was diagnosed with pneumonia and was started on IV Rocephin and Zithromax.  She also developed some SVT on 06/14/2018 for which Cardiology was consulted.  She was given a dose of adenosine and she was then started on amiodarone. Transthoracic echo did not show any acute pathology.  She was transferred under hospitalist service on 06/14/2018.  Her hemoglobin dropped again and she received 1 more unit of transfusion on 06/15/2018.  Patient has been having intermittent fever since then.  Due to being a cancer patient, she qualified for HCAP so antibiotics were broadened to IV Zosyn. Now on 5 Liters of Supplemental O2 via [place] and will wean and give IV Lasix 40 mg today.    Patient no longer nauseous and feeling better. PT recommending SNF still but patient wanting to go home. Social Work in the process of setting up private help for the patient.   Assessment & Plan:   Active Problems:   Thrombocytopenia ([place])   Uncontrolled diabetes mellitus with hyperglycemia (HCC)   Metastatic breast  cancer (HCC)   Metastasis to brain ([place])   Metastasis to bone ([place])   Sepsis ([place])   Pneumonia due to infectious organism   Chronic diastolic CHF (congestive heart failure) (HCC)   Anemia associated with chemotherapy   Chemotherapy-induced thrombocytopenia   SVT (supraventricular tachycardia) (HCC)   Cancer associated pain   Metastasis to lung (HCC)   Acute renal failure (ARF) (HCC)   Severe anemia   Pressure injury of skin   Tachycardia   Acute diastolic heart failure (HCC)  Acute Hypoxic Respiratory Failure Secondary to healthcare associated pneumonia, improving  -Due to being a cancer patient receiving active chemotherapy, she qualifies for healthcare associated pneumonia.   -Still having intermittent fever but this has improved from yesterday and TMax was 100 -Now on 5 Liters of O2 via [place] and will wean as tolerated -C/w Benzonatate and Tussionex -All cultures remain negative. Will Continue Zosyn until today and stop -Received NS bolus on 5/18 and IV Lasix 40 mg the day before yesterday and will give it again today  -Has been tested negative for COVID.   -Urine antigen for Legionella and streptococci are negative -Procalcitonin improving.  -C/w Albuterol 2.5 mg Neb q4hprn Wheezing and SOB -Wean O2; Has Home O2   SVT, improved  -?  Chemotherapy-induced cardiomyopathy. -Continues to be in Sinus Rhythm. -Seen by Cardiology. -Continues to be on oral Metoprolol 12.5 mg po BID and Amiodarone 200 mg po Daily    -Monitor on telemetry.  -Echo with no changes.  Acute on Chronic Anemia -Again likely due to chemotherapy.   -  Received total of 4 units of PRBC transfusion so far with the last being on 06/15/2018.   -Hemoglobin above 7 now and Hb/HCt is improved some and is 8.4/27.5 -Medical Oncology on board.  Thrombocytopenia -Again likely due to chemotherapy but now suspect worsened in the setting of IV Zosyn.   -Platelets  Went from 13 -> 17 -> 20 now.  No signs of bleeding.    -Continue to Monitor daily.   -Medical Oncology on board.  Type 2 Diabetes Mellitus -Controlled. Had lows of 67 and 68 so will Reduce Levemir from 10 units to 5 units -Will D/C Novolog with Meals -Was on Insulin gtt and now off -C/w Moderate Novolog SSI 0-15 units TID -CBG's ranging from 136-247  Metastatic Breast Cancer -Management per Oncology Dr. Alvy Bimler -Has received 2 Cycles of Chemotherapy  -Will get Palliative for GOC Discussion and family involvement  -Sister in the process of finding private duty help as patient refusing to got SNF  History of Asthma -Not in Acute exacerbation.   -Continue Montelukast 10 mg po Daily -C/w Albuterol 2.5 mg Neb q4hprn Wheezing and SOB  Hypokalemia -In the setting of IV Lasix  -Patient's K+ was 3.1 -Replete with po KCl 40 mEQ BID x2 again -Continue to Monitor and Replete as Necessary -Repeat CMP in AM   Severe Protein Calorie Malnutrition  -Nutritionist working Remotely -C/w [employer] TID -Encourage Po Intake  -C/w Mirtazapine 15 mg po qHS  Acute on Chronic Diastolic CHF -Given IV Lasix Yesterday with improvement -Strict I's/O's and Daily Weights -Patient is +4.982 and Weight is +1 lb but not done in the last few days -Continue to Monitor for S/Sx of Volume Overload -Repeat CXR in AM   Leukocytosis -Patient's WBC went from 9.2 -> 11.5 -> 13.8 -Currently on IV Zosyn and will now stop -Oncology believes this is rebound Leukocytosis in the setting of Chemotherapy -Continue to Monitor and Repeat CBC in AM   Hyperbilirubinemia -Patient's T Bili was 1.8 -Continue to Monitor and Trend   DVT prophylaxis: SCDs Code Status: FULL CODE  Family Communication: No family present at bedside  Disposition Plan: Transferred to the Medical Floor now that she is improved and anticipate D/C Home in the next 24-48 hours if stable   Consultants:   Cardiology  Oncology  PCCM  Procedures:  ECHOCARDIOGRAM IMPRESSIONS    1. The  left ventricle has normal systolic function, with an ejection fraction of 60-65%. The cavity size was normal. Left ventricular diastolic function could not be evaluated due to nondiagnostic images. No evidence of left ventricular regional wall  motion abnormalities.  2. The right ventricle has normal systolc function. The cavity was normal. There is no increase in right ventricular wall thickness.  3. Right atrial size was mildly dilated.  4. There is prominent interventricular septal bounce that is not related to respirations.  5. The aortic valve is tricuspid.  6. Pulmonic valve regurgitation was not assessed by color flow Doppler.  7. There is significant interventricular septal bounce that is more prominent in diastole. There is a trivial posterior pericardial effusion. Differential diagnosis includes acute PE, acute pulmonary process or constrictive pericarditis. Recommend  repeat echo with respirometer outpatient once respiratory illness resolved. If still present then consider cardiac MRI to rule out contrictive pericarditis.  FINDINGS  Left Ventricle: The left ventricle has normal systolic function, with an ejection fraction of 60-65%. The cavity size was normal. There is no increase in left ventricular wall thickness. Left ventricular diastolic  function could not be evaluated due to  nondiagnostic images. No evidence of left ventricular regional wall motion abnormalities.    Right Ventricle: The right ventricle has normal systolic function. The cavity was normal. There is no increase in right ventricular wall thickness.  Left Atrium: Left atrial size was normal in size.  Right Atrium: Right atrial size was mildly dilated. Right atrial pressure is estimated at 3 mmHg.  Interatrial Septum: No atrial level shunt detected by color flow Doppler.  Pericardium: There is no evidence of pericardial effusion. There is septal bounce. There is significant interventricular septal bounce that  is more prominent in diastole. There is a trivial posterior pericardial effusion. Differential diagnosis includes  acute PE, acute pulmonary process or constrictive pericarditis. Recommend repeat echo with respirometer outpatient once respiratory illness resolved. If still present then consider cardiac MRI to rule out contrictive pericarditis.  Mitral Valve: The mitral valve is normal in structure. Mitral valve regurgitation is mild by color flow Doppler.  Tricuspid Valve: The tricuspid valve was normal in structure. Tricuspid valve regurgitation is mild by color flow Doppler.  Aortic Valve: The aortic valve is tricuspid Aortic valve regurgitation was not visualized by color flow Doppler.  Pulmonic Valve: The pulmonic valve was normal in structure. Pulmonic valve regurgitation was not assessed by color flow Doppler.  Venous: The inferior vena cava is normal in size with greater than 50% respiratory variability.    +--------------+--------++ LEFT VENTRICLE         +--------------+--------++ PLAX 2D                +--------------+--------++ LVIDd:        4.41 cm  +--------------+--------++ LVIDs:        3.22 cm  +--------------+--------++ LV PW:        0.94 cm  +--------------+--------++ LV IVS:       0.69 cm  +--------------+--------++ LVOT diam:    1.80 cm  +--------------+--------++ LV SV:        47 ml    +--------------+--------++ LV SV Index:  28.42    +--------------+--------++ LVOT Area:    2.54 cm +--------------+--------++                        +--------------+--------++  +---------------+---------++ RIGHT VENTRICLE          +---------------+---------++ RV Basal diam: 2.64 cm   +---------------+---------++ RVSP:          29.4 mmHg +---------------+---------++  +---------------+-------++-----------++ LEFT ATRIUM           Index       +---------------+-------++-----------++ LA diam:        3.70 cm2.30 cm/m  +---------------+-------++-----------++ LA Vol (A2C):  41.7 ml25.89 ml/m +---------------+-------++-----------++ LA Vol (A4C):  40.3 ml25.02 ml/m +---------------+-------++-----------++ LA Biplane Vol:43.4 ml26.95 ml/m +---------------+-------++-----------++ +------------+---------++-----------++ RIGHT ATRIUM         Index       +------------+---------++-----------++ RA Pressure:3.00 mmHg            +------------+---------++-----------++ RA Area:    16.70 cm            +------------+---------++-----------++ RA Volume:  44.70 ml 27.76 ml/m +------------+---------++-----------++  +------------+-----------++ AORTIC VALVE            +------------+-----------++ LVOT Vmax:  138.00 cm/s +------------+-----------++ LVOT Vmean: 92.600 cm/s +------------+-----------++ LVOT VTI:   0.253 m     +------------+-----------++   +-------------+-------++ AORTA                +-------------+-------++  Ao Root diam:2.70 cm +-------------+-------++ Ao Asc diam: 2.50 cm +-------------+-------++  +--------------+----------++ +---------------+-----------++ MITRAL VALVE             TRICUSPID VALVE            +--------------+----------++ +---------------+-----------++ MV Area (PHT):4.06 cm   TR Peak grad:  26.4 mmHg   +--------------+----------++ +---------------+-----------++ MV PHT:       54.23 msec TR Vmax:       257.00 cm/s +--------------+----------++ +---------------+-----------++ MV Decel Time:187 msec   Estimated RAP: 3.00 mmHg   +--------------+----------++ +---------------+-----------++ +--------------+----------++ RVSP:          29.4 mmHg   MV E velocity:99.80 cm/s +---------------+-----------++ +--------------+----------++ MV A velocity:93.40 cm/s +--------------+-------+ +--------------+----------++ SHUNTS                MV E/A ratio:  1.07       +--------------+-------+ +--------------+----------++ Systemic VTI: 0.25 m                               +--------------+-------+                              Systemic Diam:1.80 cm                              +--------------+-------+  Antimicrobials:  Anti-infectives (From admission, onward)   Start     Dose/Rate Route Frequency Ordered Stop   06/16/18 0900  piperacillin-tazobactam (ZOSYN) IVPB 3.375 g     3.375 g 12.5 mL/hr over 240 Minutes Intravenous Every 8 hours 06/16/18 0738     06/11/18 1700  cefTRIAXone (ROCEPHIN) 1 g in sodium chloride 0.9 % 100 mL IVPB     1 g 200 mL/hr over 30 Minutes Intravenous Every 24 hours 06/11/18 1317 06/15/18 2159   06/11/18 1600  azithromycin (ZITHROMAX) 500 mg in sodium chloride 0.9 % 250 mL IVPB     500 mg 250 mL/hr over 60 Minutes Intravenous Every 24 hours 06/11/18 1317 06/15/18 2159   06/11/18 1230  cefTRIAXone (ROCEPHIN) 1 g in sodium chloride 0.9 % 100 mL IVPB  Status:  Discontinued     1 g 200 mL/hr over 30 Minutes Intravenous  Once 06/11/18 1216 06/11/18 1506   06/11/18 1230  azithromycin (ZITHROMAX) 500 mg in sodium chloride 0.9 % 250 mL IVPB  Status:  Discontinued     500 mg 250 mL/hr over 60 Minutes Intravenous  Once 06/11/18 1216 06/11/18 1506     Subjective: Seen and examined at bedside and states that she felt ok. SOB is stable but still having coughing. Frustrated about working with therapy. No CP, Lightheadedness or dizziness. Still weak but does not want to go to SNF.   Objective: Vitals:   06/19/18 2112 06/20/18 0455 06/20/18 0845 06/20/18 1405  BP: (!) 129/55 (!) 127/47  (!) 110/51  Pulse: 98 (!) 105  87  Resp: 18 18  20   Temp: 98.2 F (36.8 C) 98.5 F (36.9 C)  97.6 F (36.4 C)  TempSrc: Oral Oral  Oral  SpO2: 96% 97% 96% 96%  Weight:      Height:        Intake/Output Summary (Last 24 hours) at 06/20/2018 1840 Last data filed at 06/20/2018 1819 Gross per 24 hour  Intake 10 ml  Output 2400 ml   Net -2390 ml  Filed Weights   06/14/18 0500 06/15/18 0500 06/16/18 0500  Weight: 60.6 kg 61 kg 60.7 kg   Examination: Physical Exam:  Constitutional: Ill-appearing Caucasian female currently no acute distress appears calm but does appear fatigued Eyes: Lids extract are normal.  Sclera anicteric ENMT: External ears and nose appear normal.  Grossly normal hearing Neck: Appears supple with no JVD Respiratory: Diminished auscultation bilaterally with coarse breath sounds.  No appreciable wheezing, rales or rhonchi.  Patient is not tachypneic but she is still wearing 5 L of supplemental oxygen via nasal cannula Cardiovascular: Regular rate and rhythm.  Has a 2 out of 6 systolic murmur.  Has mild lower extremity edema Abdomen: Soft, nontender, nondistended.  Bowel sounds GU: Deferred Musculoskeletal: No contractures or cyanosis.  Has a Port-A-Cath in place Skin: Skin is warm dry no appreciable rashes or lesions on skin evaluation Neurologic: Cranial nerves II through XII gross intact no appreciable focal deficits Psychiatric: Is intact judgment insight she is awake, alert and oriented.  Data Reviewed: I have personally reviewed following labs and imaging studies  CBC: Recent Labs  Lab 06/14/18 0206  06/16/18 0232 06/17/18 1137 06/18/18 0616 06/19/18 0428 06/20/18 0446  WBC 14.9*   < > 8.3 9.4 9.2 11.5* 13.8*  NEUTROABS 8.1*  --   --  6.4 6.3 7.9* 11.7*  HGB 8.5*   < > 8.3* 7.6* 7.3* 8.5* 8.4*  HCT 27.4*   < > 25.6* 24.6* 23.8* 26.3* 27.5*  MCV 89.5   < > 88.0 91.1 90.8 89.8 91.1  PLT 36*   < > 21* 18* 13* 17* 20*   < > = values in this interval not displayed.   Basic Metabolic Panel: Recent Labs  Lab 06/15/18 0500 06/16/18 0232 06/17/18 0416 06/18/18 0616 06/19/18 0428 06/20/18 0446  NA 139 136  --  138 138 138  K 3.7 3.5  --  2.5* 3.4* 3.1*  CL 102 98  --  100 101 100  CO2 28 28  --  29 29 31   GLUCOSE 76 158*  --  122* 151* 173*  BUN 17 13  --  10 12 10    CREATININE 0.63 0.55  --  0.47 0.44 0.41*  CALCIUM 7.6* 7.6*  --  7.8* 8.1* 8.2*  MG 1.8 1.7 1.4* 1.7 2.0 1.8  PHOS  --   --   --  2.6 3.2 3.2   GFR: Estimated Creatinine Clearance: 65.7 mL/min (A) (by C-G formula based on SCr of 0.41 mg/dL (L)). Liver Function Tests: Recent Labs  Lab 06/14/18 0206 06/18/18 0616 06/20/18 0446  AST 26 16 22   ALT 16 12 15   ALKPHOS 180* 156* 299*  BILITOT 1.6* 1.5* 1.8*  PROT 6.0* 5.0* 5.3*  ALBUMIN 1.9* 1.3* 1.3*   No results for input(s): LIPASE, AMYLASE in the last 168 hours. No results for input(s): AMMONIA in the last 168 hours. Coagulation Profile: No results for input(s): INR, PROTIME in the last 168 hours. Cardiac Enzymes: No results for input(s): CKTOTAL, CKMB, CKMBINDEX, TROPONINI in the last 168 hours. BNP (last 3 results) No results for input(s): PROBNP in the last 8760 hours. HbA1C: No results for input(s): HGBA1C in the last 72 hours. CBG: Recent Labs  Lab 06/19/18 1204 06/19/18 2108 06/20/18 0721 06/20/18 1150 06/20/18 1707  GLUCAP 209* 247* 136* 151* 166*   Lipid Profile: No results for input(s): CHOL, HDL, LDLCALC, TRIG, CHOLHDL, LDLDIRECT in the last 72 hours. Thyroid Function Tests: No results for input(s): TSH, T4TOTAL, FREET4,  T3FREE, THYROIDAB in the last 72 hours. Anemia Panel: No results for input(s): VITAMINB12, FOLATE, FERRITIN, TIBC, IRON, RETICCTPCT in the last 72 hours. Sepsis Labs: Recent Labs  Lab 06/16/18 0530  PROCALCITON 6.35    Recent Results (from the past 240 hour(s))  Blood Culture (routine x 2)     Status: None   Collection Time: 06/11/18 10:55 AM  Result Value Ref Range Status   Specimen Description   Final    BLOOD LEFT ARM Performed at [facility], [address], [place], [place] [address]    Special Requests   Final    BOTTLES DRAWN AEROBIC AND ANAEROBIC Blood Culture adequate volume Performed at [facility] [address], [place], [place] [address]     Culture   Final    NO GROWTH 5 DAYS Performed at [facility], [place] [address] [place], [place] [address]    Report Status 06/16/2018 FINAL  Final  Urine culture     Status: Abnormal   Collection Time: 06/11/18 10:55 AM  Result Value Ref Range Status   Specimen Description   Final    Urine Performed at [facility] [address], [place], [place] [address]    Special Requests   Final    Immunocompromised Performed at [facility], [place] [address], [place], [place] [address]    Culture (A)  Final    <10,000 COLONIES/mL INSIGNIFICANT GROWTH Performed at [facility] [address] [place], [place] [address]    Report Status 06/14/2018 FINAL  Final  Blood Culture (routine x 2)     Status: None   Collection Time: 06/11/18 11:00 AM  Result Value Ref Range Status   Specimen Description   Final    BLOOD CHEST RIGHT Performed at [facility] [address], [place], [place] [address]    Special Requests   Final    BOTTLES DRAWN AEROBIC AND ANAEROBIC Blood Culture adequate volume Performed at [facility] [address], [place], [place] [address]    Culture   Final    NO GROWTH 5 DAYS Performed at [facility], [place] [address], [place],  [address]    Report Status 06/16/2018 FINAL  Final  SARS Coronavirus 2 (CEPHEID - Performed in [facility] hospital lab), Hosp Order     Status: None   Collection Time: 06/11/18 11:50 AM  Result Value Ref Range Status   SARS Coronavirus 2 NEGATIVE NEGATIVE Final    Comment: (NOTE) If result is NEGATIVE SARS-CoV-2 target nucleic acids are NOT DETECTED. The SARS-CoV-2 RNA is generally detectable in upper and lower  respiratory specimens during the acute phase of infection. The lowest  concentration of SARS-CoV-2 viral copies this assay can detect is 250  copies / mL. A negative result does not preclude SARS-CoV-2 infection  and should not  be used as the sole basis for treatment or other  patient management decisions.  A negative result may occur with  improper specimen collection / handling, submission of specimen other  than nasopharyngeal swab, presence of viral mutation(s) within the  areas targeted by this assay, and inadequate number of viral copies  (<250 copies / mL). A negative result must be combined with clinical  observations, patient history, and epidemiological information. If result is POSITIVE SARS-CoV-2 target nucleic acids are DETECTED. The SARS-CoV-2 RNA is generally detectable in upper and lower  respiratory specimens dur ing the acute  phase of infection.  Positive  results are indicative of active infection with SARS-CoV-2.  Clinical  correlation with patient history and other diagnostic information is  necessary to determine patient infection status.  Positive results do  not rule out bacterial infection or co-infection with other viruses. If result is PRESUMPTIVE POSTIVE SARS-CoV-2 nucleic acids MAY BE PRESENT.   A presumptive positive result was obtained on the submitted specimen  and confirmed on repeat testing.  While 2019 novel coronavirus  (SARS-CoV-2) nucleic acids may be present in the submitted sample  additional confirmatory testing may be necessary for epidemiological  and / or clinical management purposes  to differentiate between  SARS-CoV-2 and other Sarbecovirus currently known to infect humans.  If clinically indicated additional testing with an alternate test  methodology [phone]) is advised. The SARS-CoV-2 RNA is generally  detectable in upper and lower respiratory sp ecimens during the acute  phase of infection. The expected result is Negative. Fact Sheet for Patients:  StrictlyIdeas.no Fact Sheet for Healthcare Providers: BankingDealers.co.za This test is not yet approved or cleared by the [place] FDA and has been authorized  for detection and/or diagnosis of SARS-CoV-2 by FDA under an Emergency Use Authorization (EUA).  This EUA will remain in effect (meaning this test can be used) for the duration of the COVID-19 declaration under Section 564(b)(1) of the Act, 21 U.S.C. section 360bbb-3(b)(1), unless the authorization is terminated or revoked sooner. Performed at [facility], [place] [address] [place], [place] [address]   MRSA PCR Screening     Status: None   Collection Time: 06/11/18  3:57 PM  Result Value Ref Range Status   MRSA by PCR NEGATIVE NEGATIVE Final    Comment:        The GeneXpert MRSA Assay (FDA approved for NASAL specimens only), is one component of a comprehensive MRSA colonization surveillance program. It is not intended to diagnose MRSA infection nor to guide or monitor treatment for MRSA infections. Performed at [facility], [place] [address] [place], [place] [address]      RN Pressure Injury Documentation: Pressure Injury 06/11/18 Deep Tissue Injury - Purple or maroon localized area of discolored intact skin or blood-filled blister due to damage of underlying soft tissue from pressure and/or shear. (Active)  06/11/18 1900  Location: Sacrum  Location Orientation: Mid  Staging: Deep Tissue Injury - Purple or maroon localized area of discolored intact skin or blood-filled blister due to damage of underlying soft tissue from pressure and/or shear.  Wound Description (Comments):   Present on Admission: Yes     Pressure Injury 06/12/18 Deep Tissue Injury - Purple or maroon localized area of discolored intact skin or blood-filled blister due to damage of underlying soft tissue from pressure and/or shear. Deep purple and pink (Active)  06/12/18 1600  Location: Heel  Location Orientation: Right  Staging: Deep Tissue Injury - Purple or maroon localized area of discolored intact skin or blood-filled blister due to damage of underlying soft tissue from  pressure and/or shear.  Wound Description (Comments): Deep purple and pink  Present on Admission: Yes     Estimated body mass index is 24.48 kg/m as calculated from the following:   Height as of this encounter: 5\' 2"  (1.575 m).   Weight as of this encounter: 60.7 kg.  Malnutrition Type:  Nutrition Problem: Increased nutrient needs Etiology: chronic illness, cancer and cancer related treatments   Malnutrition Characteristics:  Signs/Symptoms: estimated needs   Nutrition Interventions:  Interventions: Ensure Enlive (each supplement provides 350kcal and 20 grams of protein), MVI  Radiology Studies: Dg Chest Port 1 View  Result Date: 06/20/2018 CLINICAL DATA:  Shortness of breath. EXAM: PORTABLE CHEST 1 VIEW COMPARISON:  Radiograph Jun 19, 2018. FINDINGS: Stable cardiomegaly. No pneumothorax is noted. Right internal jugular Port-A-Cath is unchanged in position. Stable bilateral lung opacities are noted concerning for edema or atelectasis with associated pleural effusions, right greater than left. Bony thorax is unremarkable. IMPRESSION: Stable bilateral lung opacities concerning for edema or atelectasis with associated pleural effusions. Electronically Signed   By: Marijo Conception M.D.   On: 06/20/2018 08:22   Dg Chest Port 1 View  Result Date: 06/19/2018 CLINICAL DATA:  History of metastatic breast cancer. EXAM: PORTABLE CHEST 1 VIEW COMPARISON:  Jun 17, 2018 FINDINGS: Right Port-A-Cath is stable. Bilateral pleural effusions, right greater than left with underlying atelectasis/consolidation. Cardiomediastinal silhouette is stable. IMPRESSION: No interval change in bilateral pleural effusions with underlying atelectasis/consolidation. Stable right Port-A-Cath. Electronically Signed   By: Dorise Bullion III M.D   On: 06/19/2018 10:10   Scheduled Meds: . sodium chloride   Intravenous Once  . amiodarone  200 mg Oral Daily  . Chlorhexidine Gluconate Cloth  6 each Topical Daily  .  feeding supplement (ENSURE ENLIVE)  237 mL Oral TID BM  . insulin aspart  0-15 Units Subcutaneous TID WC  . insulin detemir  5 Units Subcutaneous QHS  . magic mouthwash w/lidocaine  5 mL Oral TID  . mouth rinse  15 mL Mouth Rinse BID  . metoprolol tartrate  12.5 mg Oral BID  . mirtazapine  15 mg Oral QHS  . montelukast  10 mg Oral Daily  . multivitamin with minerals  1 tablet Oral Daily  . pantoprazole  40 mg Oral Daily  . potassium chloride  40 mEq Oral BID  . sodium chloride flush  10-40 mL Intracatheter Q12H   Continuous Infusions: . lactated ringers 10 mL/hr at 06/14/18 1200  . piperacillin-tazobactam 3.375 g (06/20/18 1227)    LOS: 9 days   Kerney Elbe, DO [facility] PAGER is on [place]  If 7PM-7AM, please contact night-coverage [email or web address] Password [facility] 06/20/2018, 6:40 PM

## 2018-06-20 NOTE — Progress Notes (Signed)
Stacy Shelton   DOB:07-16-1960   BP#:[id number]    ASSESSMENT & PLAN:  Metastatic breast cancer She has received 2 cycles of chemotherapy so far with improved disease control I am not concerned about mild changes noted on her CT chest in regards to the lymph nodes and lung nodule.Liver metastasis is shrinking Continue aggressive supportive care  SVT, hypotensive, resolved ECHOshowed no evidence of cardiomyopathy. The restrictiveheart movement is likely due to consolidation of the lungs noted on her CT imaging Continue on amiodarone  Acute renal failure, resolved This was secondary to recent nausea, vomiting and dehydration. 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 Her fever is resolved. Chest x-ray is reviewed which showed possibly mild congestion/overload. She hadreceiving Lasix for diuresis and a touch of morphine for anxiety on  06/17/2018 with improvement Today is day 10 of antibiotics; hopefully this can be stopped today  She will continue to work with respiratory therapy and flutter valve  Clinical depression, stable She has clinical signs of depression, verified and confirmed by family members I have started her on Remeron that should help with stabilizing her mood I will continue to check on her and provide emotional support Once she is more stable, we will consult psychiatrist to follow  Sacral decubitus and heel ulcers Consult wound care, continue frequent repositioning  Profound weakness, critical care myopathy Continue PT; patient wants to go home PT rather than SNF  Loose stool, resolved I have DC laxatives  Hypokalemia Replete per primary team  Code Status Full code  Goals of care Aggressive supportive care. Hopefully, she can be moved discharged next few days  Discharge planning I will continue to check on her later today I will call to update her sister, [name] and her mother All questions were answered. The patient knows to call the clinic with any problems, questions or concerns. I will return next week. If she is DC this weekend, I will set up outpatient follow-up   [name], MD 06/20/2018 8:41 AM  Subjective:  She continues to have cough.  Appetite is fair.  She is eating well.  Loose stool has stopped since discontinuation of stool softener.  She has been afebrile.  She is tolerating the support boots well.  Objective:  Vitals:   06/19/18 2112 06/20/18 0455  BP: (!) 129/55 (!) 127/47  Pulse: 98 (!) 105  Resp: 18 18  Temp: 98.2 F (36.8 C) 98.5 F (36.9 C)  SpO2: 96% 97%     Intake/Output Summary (Last 24 hours) at 06/20/2018 0841 Last data filed at 06/20/2018 0500 Gross  per 24 hour  Intake 492.65 ml  Output 450 ml  Net 42.65 ml    GENERAL:alert, no distress and comfortable SKIN: skin color, texture, turgor are normal, no rashes or significant lesions EYES:  normal, Conjunctiva are pink and non-injected, sclera clear OROPHARYNX:no exudate, no erythema and lips, buccal mucosa, and tongue normal  NECK: supple, thyroid normal size, non-tender, without nodularity LYMPH:  no palpable lymphadenopathy in the cervical, axillary or inguinal LUNGS: clear to auscultation and percussion with normal breathing effort HEART: regular rate & rhythm and no murmurs and no lower extremity edema ABDOMEN:abdomen soft, non-tender and normal bowel sounds Musculoskeletal:no cyanosis of digits and no clubbing  NEURO: alert & oriented x 3 with fluent speech, no focal motor/sensory deficits   Labs:  Recent Labs    05/06/18 0430  06/14/18 0206  06/18/18 0616 06/19/18 0428 06/20/18 0446  NA 138   < > 138   < > 138 138 138  K 3.6   < > 3.9   < > 2.5* 3.4* 3.1*  CL 99   < > 101   < > 100 101 100  CO2 32   < > 27   < > 29 29 31   GLUCOSE 138*   < > 85   < > 122* 151* 173*  BUN 14   < > 21*   < > 10 12 10   CREATININE 0.54   < > 0.68   < > 0.47 0.44 0.41*  CALCIUM 7.8*   < > 7.9*   < > 7.8* 8.1* 8.2*  GFRNONAA >60   < > >60   < > >60 >60 >60  GFRAA >60   < > >60   < > >60 >60 >60  PROT 5.4*   < > 6.0*  --  5.0*  --  5.3*  ALBUMIN 1.7*   < > 1.9*  --  1.3*  --  1.3*  AST 23   < > 26  --  16  --  22  ALT 14   < > 16  --  12  --  15  ALKPHOS 372*   < > 180*  --  156*  --  299*  BILITOT 2.0*   < > 1.6*  --  1.5*  --  1.8*  BILIDIR 1.0*  --   --   --   --   --   --   IBILI 1.0*  --   --   --   --   --   --    < > = values in this interval not displayed.    Studies:  Ct Angio Chest Pe W Or Wo Contrast  Result Date: 06/11/2018 CLINICAL DATA:  Shortness of breath. Metastatic breast cancer. EXAM: CT ANGIOGRAPHY CHEST WITH CONTRAST TECHNIQUE: Multidetector CT imaging of the chest was performed using the standard protocol during bolus administration of intravenous contrast. Multiplanar CT image reconstructions and MIPs were obtained to evaluate the vascular anatomy.  CONTRAST:  71mL OMNIPAQUE IOHEXOL 350 MG/ML SOLN COMPARISON:  Chest radiograph 06/11/2018. Thoracic spine CT 05/19/2018. Chest CTA 04/27/2018. FINDINGS: Cardiovascular: Pulmonary arterial opacification is adequate without evidence of lobar more proximal emboli. No segmental emboli are identified although assessment is limited in some areas due to motion artifact. Mild thoracic aortic atherosclerosis is noted without aneurysm. The heart is enlarged. There is no pericardial effusion. A right jugular Port-A-Cath terminates in the high right atrium. Mediastinum/Nodes: Calcified subcarinal and right hilar lymph nodes are again noted.  Mildly enlarged noncalcified lymph nodes including a 10 mm short axis right paratracheal lymph node, levin mm precarinal lymph node, an 11 mm right hilar lymph node are unchanged from the prior chest CTA. The esophagus and thyroid are unremarkable. Lungs/Pleura: There are small bilateral pleural effusions, unchanged on the right and decreased in size on the left compared to the prior chest CTA. Extensive right lower lobe consolidation has greatly progressed from the prior chest CTA with progression also noted compared to the more recent thoracic spine CT. There is mildly improved aeration of the left lower lobe compared to the prior chest CTA, however moderately extensive consolidation and volume loss remain. A 2.5 x 2.2 cm right upper lobe nodule is unchanged from the thoracic spine CT though has enlarged from the chest CTA (series 10, image 51), as has an 8 mm nodule more anterolaterally in the right upper lobe (series 10, image 50). Multiple subcentimeter nodules are present in the left upper lobe with some being larger compared to the prior chest CT and others being unchanged. Upper Abdomen: Liver lesions have decreased in size, for example a segment VII/VIII lesion now measures 16 mm (series 4, image 85, previously 23 mm). Calcified granulomas are noted in the spleen. Musculoskeletal:  Unchanged subcentimeter sclerotic foci in the T11 and T12 vertebral bodies, left glenoid, and inferior right scapular body. Review of the MIP images confirms the above findings. IMPRESSION: 1. No evidence of pulmonary emboli. 2. Extensive bilateral lower lobe consolidation. 3. Small pleural effusions. 4. 2.5 cm right upper lobe nodule, enlarged from 04/27/2018 and which may reflect a metastasis or primary lung cancer. Stable to mild enlargement of subcentimeter nodules in both upper lobes consistent with known metastases. 5. Decreased size of liver metastases. 6. Unchanged mild mediastinal and right hilar lymphadenopathy. 7. Aortic Atherosclerosis (ICD10-I70.0). Electronically Signed   By: Logan Bores M.D.   On: 06/11/2018 17:02   Dg Chest Port 1 View  Result Date: 06/20/2018 CLINICAL DATA:  Shortness of breath. EXAM: PORTABLE CHEST 1 VIEW COMPARISON:  Radiograph Jun 19, 2018. FINDINGS: Stable cardiomegaly. No pneumothorax is noted. Right internal jugular Port-A-Cath is unchanged in position. Stable bilateral lung opacities are noted concerning for edema or atelectasis with associated pleural effusions, right greater than left. Bony thorax is unremarkable. IMPRESSION: Stable bilateral lung opacities concerning for edema or atelectasis with associated pleural effusions. Electronically Signed   By: Marijo Conception M.D.   On: 06/20/2018 08:22   Dg Chest Port 1 View  Result Date: 06/19/2018 CLINICAL DATA:  History of metastatic breast cancer. EXAM: PORTABLE CHEST 1 VIEW COMPARISON:  Jun 17, 2018 FINDINGS: Right Port-A-Cath is stable. Bilateral pleural effusions, right greater than left with underlying atelectasis/consolidation. Cardiomediastinal silhouette is stable. IMPRESSION: No interval change in bilateral pleural effusions with underlying atelectasis/consolidation. Stable right Port-A-Cath. Electronically Signed   By: Dorise Bullion III M.D   On: 06/19/2018 10:10   Dg Chest Port 1 View  Result Date:  06/17/2018 CLINICAL DATA:  58 year old female with respiratory distress EXAM: PORTABLE CHEST 1 VIEW COMPARISON:  06/16/2018, 06/12/2018, CT 06/11/2018 FINDINGS: Cardiomediastinal silhouette likely unchanged in size and contour. Heart borders partially obscured by overlying lung and pleural disease. Opacities at the bilateral lung bases obscuring the hemidiaphragms and heart borders, appears unchanged from prior. Graded opacities towards the bases. Right hilar opacity persists. Partially calcified nodes at the right hilum. No pneumothorax. Right IJ port catheter. No displaced fracture IMPRESSION: Similar appearance of the chest x-ray, with bilateral right greater  than left pleural effusions and associated atelectasis/consolidation. Right hilar opacities better seen on recent CT imaging. Unchanged right IJ port catheter. Electronically Signed   By: Corrie Mckusick D.O.   On: 06/17/2018 08:05   Dg Chest Port 1 View  Result Date: 06/16/2018 CLINICAL DATA:  Metastatic breast cancer pneumonia EXAM: PORTABLE CHEST 1 VIEW COMPARISON:  06/12/2018 FINDINGS: Right-sided Port-A-Cath with the tip projecting over the SVC. Small right pleural effusion. Bilateral lower lobe airspace disease most concerning for pneumonia. Persistent right upper lobe pulmonary nodule consistent malignancy. No pneumothorax. Stable cardiomediastinal silhouette. No aggressive osseous lesion. IMPRESSION: 1. Bilateral lower lobe airspace disease most consistent with pneumonia. Small right pleural effusion. 2. Persistent right upper lobe pulmonary nodule consistent with malignancy. Electronically Signed   By: Kathreen Devoid   On: 06/16/2018 08:49   Dg Chest Port 1 View  Result Date: 06/12/2018 CLINICAL DATA:  58 year old female with possible pneumonia in cancer patient. EXAM: PORTABLE CHEST 1 VIEW COMPARISON:  06/11/2018 and earlier. FINDINGS: Portable AP semi upright view at 0943 hours. Stable right chest power port, accessed. Increasing confluence of  mid and lower lung opacity. Consolidation demonstrated by CT yesterday. Trace pleural fluid more apparent by CT. No superimposed pneumothorax or pulmonary edema. Stable cardiac size and mediastinal contours. Visualized tracheal air column is within normal limits. IMPRESSION: Mild radiographic progression of bilateral consolidation/pneumonia since yesterday. Electronically Signed   By: Genevie Ann M.D.   On: 06/12/2018 10:37   Dg Chest Port 1 View  Result Date: 06/11/2018 CLINICAL DATA:  Hypotension. EXAM: PORTABLE CHEST 1 VIEW COMPARISON:  Radiograph of May 19, 2018. FINDINGS: Stable cardiomediastinal silhouette. Right internal jugular Port-A-Cath is unchanged in position. No pneumothorax is noted. Increased bibasilar opacities are noted concerning for pneumonia or edema. Small pleural effusions may be present. Bony thorax is unremarkable. IMPRESSION: Increased bibasilar opacities are noted concerning for worsening pneumonia or edema. Small pleural effusions may be present. Electronically Signed   By: Marijo Conception M.D.   On: 06/11/2018 11:43

## 2018-06-20 NOTE — Consult Note (Addendum)
[place] Nurse wound follow up Refer to previous consult note from 5/21.  Assessment performed to right heel and sacrum. Right heel with deep tissue pressure injury; .5X.5cm dark purple intact skin.  Sacrum with 2 areas of deep tissue pressure injury; .8X.8cm and .5X.5cm, beginning to lift at edges and peel and evolve into stage 2 pressure injuries in patchy areas.  These pressure injuries were noted as present on admission. Dressing procedure/placement/frequency: Continue present plan of care; float heels using Prevalon boots to reduce pressure and reduce potential foot drop occurrence. Foam dressings to protect from further injury to both sites. Please re-consult if further assistance is needed.  Thank-you,  Julien Girt MSN, [place], [place], [place], [place]

## 2018-06-21 ENCOUNTER — Inpatient Hospital Stay (HOSPITAL_COMMUNITY): Payer: BLUE CROSS/BLUE SHIELD

## 2018-06-21 DIAGNOSIS — Z515 Encounter for palliative care: Secondary | ICD-10-CM

## 2018-06-21 DIAGNOSIS — Z7189 Other specified counseling: Secondary | ICD-10-CM

## 2018-06-21 DIAGNOSIS — R64 Cachexia: Secondary | ICD-10-CM

## 2018-06-21 LAB — COMPREHENSIVE METABOLIC PANEL
ALT: 15 U/L (ref 0–44)
AST: 22 U/L (ref 15–41)
Albumin: 1.4 g/dL — ABNORMAL LOW (ref 3.5–5.0)
Alkaline Phosphatase: 332 U/L — ABNORMAL HIGH (ref 38–126)
Anion gap: 7 (ref 5–15)
BUN: 12 mg/dL (ref 6–20)
CO2: 36 mmol/L — ABNORMAL HIGH (ref 22–32)
Calcium: 8.2 mg/dL — ABNORMAL LOW (ref 8.9–10.3)
Chloride: 96 mmol/L — ABNORMAL LOW (ref 98–111)
Creatinine, Ser: 0.48 mg/dL (ref 0.44–1.00)
GFR calc Af Amer: >60 mL/min (ref >60)
GFR calc non Af Amer: >60 mL/min (ref >60)
Glucose, Bld: 116 mg/dL — ABNORMAL HIGH (ref 70–99)
Potassium: 4 mmol/L (ref 3.5–5.1)
Sodium: 139 mmol/L (ref 135–145)
Total Bilirubin: 1.4 mg/dL — ABNORMAL HIGH (ref 0.3–1.2)
Total Protein: 5.7 g/dL — ABNORMAL LOW (ref 6.5–8.1)

## 2018-06-21 LAB — CBC WITH DIFFERENTIAL/PLATELET
Abs Immature Granulocytes: 2.07 10*3/uL — ABNORMAL HIGH (ref 0.00–0.07)
Basophils Absolute: 0.5 10*3/uL — ABNORMAL HIGH (ref 0.0–0.1)
Basophils Relative: 5 %
Eosinophils Absolute: 0.1 10*3/uL (ref 0.0–0.5)
Eosinophils Relative: 1 %
HCT: 27 % — ABNORMAL LOW (ref 36.0–46.0)
Hemoglobin: 8.2 g/dL — ABNORMAL LOW (ref 12.0–15.0)
Immature Granulocytes: 20 %
Lymphocytes Relative: 14 %
Lymphs Abs: 1.5 10*3/uL (ref 0.7–4.0)
MCH: 28.2 pg (ref 26.0–34.0)
MCHC: 30.4 g/dL (ref 30.0–36.0)
MCV: 92.8 fL (ref 80.0–100.0)
Monocytes Absolute: 0.5 10*3/uL (ref 0.1–1.0)
Monocytes Relative: 5 %
Neutro Abs: 5.8 10*3/uL (ref 1.7–7.7)
Neutrophils Relative %: 55 %
Platelets: 17 10*3/uL — CL (ref 150–400)
RBC: 2.91 MIL/uL — ABNORMAL LOW (ref 3.87–5.11)
RDW: 16 % — ABNORMAL HIGH (ref 11.5–15.5)
WBC: 10.5 10*3/uL (ref 4.0–10.5)
nRBC: 0 % (ref 0.0–0.2)

## 2018-06-21 LAB — GLUCOSE, CAPILLARY
Glucose-Capillary: 105 mg/dL — ABNORMAL HIGH (ref 70–99)
Glucose-Capillary: 162 mg/dL — ABNORMAL HIGH (ref 70–99)
Glucose-Capillary: 249 mg/dL — ABNORMAL HIGH (ref 70–99)

## 2018-06-21 LAB — PHOSPHORUS: Phosphorus: 4.2 mg/dL (ref 2.5–4.6)

## 2018-06-21 LAB — MAGNESIUM: Magnesium: 1.9 mg/dL (ref 1.7–2.4)

## 2018-06-21 MED ORDER — FUROSEMIDE 10 MG/ML IJ SOLN
40.0000 mg | Freq: Once | INTRAMUSCULAR | Status: AC
  Administered 2018-06-21: 40 mg via INTRAVENOUS
  Filled 2018-06-21: qty 4

## 2018-06-21 MED ORDER — FUROSEMIDE 10 MG/ML IJ SOLN
40.0000 mg | Freq: Once | INTRAMUSCULAR | Status: AC
  Administered 2018-06-21: 19:00:00 40 mg via INTRAVENOUS
  Filled 2018-06-21: qty 4

## 2018-06-21 MED ORDER — POTASSIUM CHLORIDE CRYS ER 20 MEQ PO TBCR
40.0000 meq | EXTENDED_RELEASE_TABLET | Freq: Two times a day (BID) | ORAL | Status: AC
  Administered 2018-06-21 – 2018-06-22 (×2): 40 meq via ORAL
  Filled 2018-06-21 (×2): qty 2

## 2018-06-21 NOTE — Progress Notes (Signed)
PROGRESS NOTE    Stacy KARRER  Shelton:[id number] DOB: 18-Sep-1960 DOA: 06/11/2018 PCP: Stacy Peers, MD   Brief Narrative:  Stacy Shelton is a pleasant 58 year old woman with history of metastatic breast cancer who is currently undergoing chemotherapy by Dr. Ernst Shelton with last session on 05/28/2018 (Taxotere, Herceptin, Perjeta). she was feeling fine until 06/11/2018 when she started having nausea, vomiting and generalized weakness and went to see her oncologist and was found to be having anemia with hemoglobin of 6.2 and acute kidney injury with creatinine of 1.63 and was hypotensive and hyperglycemic so she was sent to the emergency department for further management.  She was initially admitted under hospitalist service but due to deterioration and requirement of vasopressors, she was transferred under PCCM/intensivist care.  She received total of 3 units of PRBC and vasopressors for some time.  She then he started having fever and was diagnosed with pneumonia and was started on IV Rocephin and Zithromax.  She also developed some SVT on 06/14/2018 for which Cardiology was consulted.  She was given a dose of adenosine and she was then started on amiodarone. Transthoracic echo did not show any acute pathology.  She was transferred under hospitalist service on 06/14/2018.  Her hemoglobin dropped again and she received 1 more unit of transfusion on 06/15/2018.  Patient has been having intermittent fever since then.  Due to being a cancer patient, she qualified for HCAP so antibiotics were broadened to IV Zosyn. Now on 5 Liters of Supplemental O2 via [place] and will wean and give IV Lasix 40 mg today.    Patient no longer nauseous and feeling better. PT recommending SNF still but patient wanting to go home. Social Work in the process of setting up private help for the patient but family does not have that set up yet. Repeat CXR this AM showed partially loculated Pleural Effusion so ordered thoracentesis but patient refused.  Agreeable to Diuresis today but wanting to go home and will go home tomorrow after diuresis today.   Assessment & Plan:   Active Problems:   Thrombocytopenia ([place])   Uncontrolled diabetes mellitus with hyperglycemia (HCC)   Metastatic breast cancer (HCC)   Metastasis to brain (Stacy Shelton)   Metastasis to bone (Stacy Shelton)   Sepsis (Stacy Shelton)   Pneumonia due to infectious organism   Chronic diastolic CHF (congestive heart failure) (HCC)   Anemia associated with chemotherapy   Chemotherapy-induced thrombocytopenia   SVT (supraventricular tachycardia) (HCC)   Cancer associated pain   Metastasis to lung (HCC)   Acute renal failure (ARF) (HCC)   Severe anemia   Pressure injury of skin   Tachycardia   Acute diastolic heart failure (HCC)  Acute Hypoxic Respiratory Failure Secondary to healthcare associated pneumonia, improving  -Due to being a cancer patient receiving active chemotherapy, she qualifies for healthcare associated pneumonia.   -Still having intermittent fever but this has improved from yesterday and TMax was 100 -Now on 3 Liters of O2 via [place] and will wean as tolerated -C/w Benzonatate and Tussionex -All cultures remain negative. Zosyn stopped  -Received NS bolus on 5/18  -Repeat Dose of IV Lasix today and give 40 mg BID today  -CXR this AM showed moderate partially loculated right pleural effusion and small left pleural effusion associated with bibasilar areas of atelectasis and/or consolidation in the bases bilaterally -A thoracentesis was ordered for the patient however she refused and she did not want to be repeated for COVID Testing due to ultrasound department policy -Has been  tested negative for COVID before -Urine antigen for Legionella and streptococci are negative -Procalcitonin improving.  -C/w Albuterol 2.5 mg Neb q4hprn Wheezing and SOB -Wean O2; Has Home O2  -We will diurese today and discharge home in a.m.  Patient is competent to make her own medical decisions though there  may be poor choices she is wanting to go home and I spoke at length with the patient's sister today who feels that sending her home would be unsafe but I explained to her that after the diuresis there is no medical reason to keep her in the hospital as the patient has refused thoracentesis and can be followed in outpatient setting closely with Medical Oncology  SVT, improved  -?  Chemotherapy-induced cardiomyopathy. -Continues to be in Sinus Rhythm. -Seen by Cardiology. -Continues to be on oral Metoprolol 12.5 mg po BID and Amiodarone 200 mg po Daily    -Monitor on telemetry.  -Echo with no changes.  Acute on Chronic Anemia -Again likely due to chemotherapy.   -Received total of 4 units of PRBC transfusion so far with the last being on 06/15/2018.   -Hemoglobin above 7 now and Hb/HCt is improved some and is 8.2/27.0 -Medical Oncology on board.  Thrombocytopenia -Again likely due to chemotherapy but now suspect worsened in the setting of IV Zosyn.   -Platelets  Went from 13 -> 17 -> 20 -> 17 now.  No signs of bleeding.   -Continue to Monitor daily.   -Medical Oncology on board.  Type 2 Diabetes Mellitus -Controlled. Had lows of 67 and 68 so will Reduce Levemir from 10 units to 5 units -Will D/C Novolog with Meals -Was on Insulin gtt and now off -C/w Moderate Novolog SSI 0-15 units TID -CBG's ranging from 105-162  Metastatic Breast Cancer -Management per Oncology Dr. Alvy Shelton -Has received 2 Cycles of Chemotherapy  -Will get Palliative for GOC Discussion and family involvement; Palliative discussed with the patient and Patient wants Her mother to be poA likely -Sister in the process of finding private duty help as patient refusing to got SNF; Patient is going to be D/C'd home in the AM   History of Asthma -Not in Acute exacerbation.   -Continue Montelukast 10 mg po Daily -C/w Albuterol 2.5 mg Neb q4hprn Wheezing and SOB  Hypokalemia -In the setting of IV Lasix  -Patient's  K+ was 4.0 thjs AM  -Replete with po KCl 40 mEQ BID x2 again given IV Lasix again today  -Continue to Monitor and Replete as Necessary -Repeat CMP in AM   Severe Protein Calorie Malnutrition  -Nutritionist working Remotely -C/w [employer] TID -Encourage Po Intake  -C/w Mirtazapine 15 mg po qHS  Acute on Chronic Diastolic CHF -Given IV Lasix with Improvement and will give IV 40 mg BID  -Strict I's/O's and Daily Weights -Patient is +3.432 and Weight is +1 lb but not done in the last few days -Continue to Monitor for S/Sx of Volume Overload -CXR this AM showed Moderate Right Pleural Effusion but patient refused Thoracentesis  -Repeat CXR in AM   Leukocytosis, improved  -Patient's WBC went from 9.2 -> 11.5 -> 13.8 -> 10.5 -Currently on IV Zosyn and will now stop -Oncology believes this is rebound Leukocytosis in the setting of Chemotherapy -Continue to Monitor and Repeat CBC in AM   Hyperbilirubinemia -Patient's T Bili was 1.8 and now 1.4 -Likely Reactive  -Continue to Monitor and Trend   DVT prophylaxis: SCDs Code Status: FULL CODE  Family Communication: No family  present at bedside extensively discussed with the patient's sister for almost 30 minutes over the phone Disposition Plan: Transferred to the Medical Floor now that she is improved and anticipate D/C Home in the next 24-48 hours if stable   Consultants:   Cardiology  Oncology  PCCM  Procedures:  ECHOCARDIOGRAM IMPRESSIONS    1. The left ventricle has normal systolic function, with an ejection fraction of 60-65%. The cavity size was normal. Left ventricular diastolic function could not be evaluated due to nondiagnostic images. No evidence of left ventricular regional wall  motion abnormalities.  2. The right ventricle has normal systolc function. The cavity was normal. There is no increase in right ventricular wall thickness.  3. Right atrial size was mildly dilated.  4. There is prominent  interventricular septal bounce that is not related to respirations.  5. The aortic valve is tricuspid.  6. Pulmonic valve regurgitation was not assessed by color flow Doppler.  7. There is significant interventricular septal bounce that is more prominent in diastole. There is a trivial posterior pericardial effusion. Differential diagnosis includes acute PE, acute pulmonary process or constrictive pericarditis. Recommend  repeat echo with respirometer outpatient once respiratory illness resolved. If still present then consider cardiac MRI to rule out contrictive pericarditis.  FINDINGS  Left Ventricle: The left ventricle has normal systolic function, with an ejection fraction of 60-65%. The cavity size was normal. There is no increase in left ventricular wall thickness. Left ventricular diastolic function could not be evaluated due to  nondiagnostic images. No evidence of left ventricular regional wall motion abnormalities.    Right Ventricle: The right ventricle has normal systolic function. The cavity was normal. There is no increase in right ventricular wall thickness.  Left Atrium: Left atrial size was normal in size.  Right Atrium: Right atrial size was mildly dilated. Right atrial pressure is estimated at 3 mmHg.  Interatrial Septum: No atrial level shunt detected by color flow Doppler.  Pericardium: There is no evidence of pericardial effusion. There is septal bounce. There is significant interventricular septal bounce that is more prominent in diastole. There is a trivial posterior pericardial effusion. Differential diagnosis includes  acute PE, acute pulmonary process or constrictive pericarditis. Recommend repeat echo with respirometer outpatient once respiratory illness resolved. If still present then consider cardiac MRI to rule out contrictive pericarditis.  Mitral Valve: The mitral valve is normal in structure. Mitral valve regurgitation is mild by color flow Doppler.   Tricuspid Valve: The tricuspid valve was normal in structure. Tricuspid valve regurgitation is mild by color flow Doppler.  Aortic Valve: The aortic valve is tricuspid Aortic valve regurgitation was not visualized by color flow Doppler.  Pulmonic Valve: The pulmonic valve was normal in structure. Pulmonic valve regurgitation was not assessed by color flow Doppler.  Venous: The inferior vena cava is normal in size with greater than 50% respiratory variability.    +--------------+--------++ LEFT VENTRICLE         +--------------+--------++ PLAX 2D                +--------------+--------++ LVIDd:        4.41 cm  +--------------+--------++ LVIDs:        3.22 cm  +--------------+--------++ LV PW:        0.94 cm  +--------------+--------++ LV IVS:       0.69 cm  +--------------+--------++ LVOT diam:    1.80 cm  +--------------+--------++ LV SV:        47 ml    +--------------+--------++  LV SV Index:  28.42    +--------------+--------++ LVOT Area:    2.54 cm +--------------+--------++                        +--------------+--------++  +---------------+---------++ RIGHT VENTRICLE          +---------------+---------++ RV Basal diam: 2.64 cm   +---------------+---------++ RVSP:          29.4 mmHg +---------------+---------++  +---------------+-------++-----------++ LEFT ATRIUM           Index       +---------------+-------++-----------++ LA diam:       3.70 cm2.30 cm/m  +---------------+-------++-----------++ LA Vol (A2C):  41.7 ml25.89 ml/m +---------------+-------++-----------++ LA Vol (A4C):  40.3 ml25.02 ml/m +---------------+-------++-----------++ LA Biplane Vol:43.4 ml26.95 ml/m +---------------+-------++-----------++ +------------+---------++-----------++ RIGHT ATRIUM         Index       +------------+---------++-----------++ RA Pressure:3.00 mmHg             +------------+---------++-----------++ RA Area:    16.70 cm            +------------+---------++-----------++ RA Volume:  44.70 ml 27.76 ml/m +------------+---------++-----------++  +------------+-----------++ AORTIC VALVE            +------------+-----------++ LVOT Vmax:  138.00 cm/s +------------+-----------++ LVOT Vmean: 92.600 cm/s +------------+-----------++ LVOT VTI:   0.253 m     +------------+-----------++   +-------------+-------++ AORTA                +-------------+-------++ Ao Root diam:2.70 cm +-------------+-------++ Ao Asc diam: 2.50 cm +-------------+-------++  +--------------+----------++ +---------------+-----------++ MITRAL VALVE             TRICUSPID VALVE            +--------------+----------++ +---------------+-----------++ MV Area (PHT):4.06 cm   TR Peak grad:  26.4 mmHg   +--------------+----------++ +---------------+-----------++ MV PHT:       54.23 msec TR Vmax:       257.00 cm/s +--------------+----------++ +---------------+-----------++ MV Decel Time:187 msec   Estimated RAP: 3.00 mmHg   +--------------+----------++ +---------------+-----------++ +--------------+----------++ RVSP:          29.4 mmHg   MV E velocity:99.80 cm/s +---------------+-----------++ +--------------+----------++ MV A velocity:93.40 cm/s +--------------+-------+ +--------------+----------++ SHUNTS                MV E/A ratio: 1.07       +--------------+-------+ +--------------+----------++ Systemic VTI: 0.25 m                               +--------------+-------+                              Systemic Diam:1.80 cm                              +--------------+-------+  Antimicrobials:  Anti-infectives (From admission, onward)   Start     Dose/Rate Route Frequency Ordered Stop   06/16/18 0900  piperacillin-tazobactam (ZOSYN) IVPB 3.375 g  Status:  Discontinued      3.375 g 12.5 mL/hr over 240 Minutes Intravenous Every 8 hours 06/16/18 0738 06/20/18 1951   06/11/18 1700  cefTRIAXone (ROCEPHIN) 1 g in sodium chloride 0.9 % 100 mL IVPB     1 g 200 mL/hr over 30 Minutes Intravenous Every 24 hours 06/11/18 1317 06/15/18 2159   06/11/18 1600  azithromycin (ZITHROMAX) 500 mg in sodium  chloride 0.9 % 250 mL IVPB     500 mg 250 mL/hr over 60 Minutes Intravenous Every 24 hours 06/11/18 1317 06/15/18 2159   06/11/18 1230  cefTRIAXone (ROCEPHIN) 1 g in sodium chloride 0.9 % 100 mL IVPB  Status:  Discontinued     1 g 200 mL/hr over 30 Minutes Intravenous  Once 06/11/18 1216 06/11/18 1506   06/11/18 1230  azithromycin (ZITHROMAX) 500 mg in sodium chloride 0.9 % 250 mL IVPB  Status:  Discontinued     500 mg 250 mL/hr over 60 Minutes Intravenous  Once 06/11/18 1216 06/11/18 1506     Subjective: Seen and examined at bedside and states she has no complaints and feels that going home is her best option.  Still refusing SNF.  Discussed with her about thoracentesis and she is refusing but however she is agreeable to take IV Lasix.  Patient is competent to make her own medical decisions and is aware of her sisters concerns but still wants to go home.  We plan on discharging home in the a.m. after IV diuresis as patient is being weaned and improved her oxygen status from yesterday.  Case was discussed with patient's primary oncologist Dr. Alvy Shelton who does agree that the patient is nearing medical discharge.  Patient understands that even though she cannot walk properly she still wants to go home and she is competent in making this decision.  Objective: Vitals:   06/20/18 2156 06/21/18 0500 06/21/18 0550 06/21/18 1259  BP: (!) 109/48  119/60 (!) 120/57  Pulse: 94  93 (!) 102  Resp:   16 (!) 26  Temp:   (!) 97.5 F (36.4 C) 98.3 F (36.8 C)  TempSrc:   Oral Oral  SpO2:   99% 96%  Weight:  64.3 kg    Height:        Intake/Output Summary (Last 24 hours) at 06/21/2018  1814 Last data filed at 06/21/2018 1431 Gross per 24 hour  Intake 250 ml  Output 2500 ml  Net -2250 ml   Filed Weights   06/15/18 0500 06/16/18 0500 06/21/18 0500  Weight: 61 kg 60.7 kg 64.3 kg   Examination: Physical Exam:  Constitutional: Chronically ill-appearing Caucasian female currently no acute distress but is very frustrated and does not want to pursue a thoracentesis and wants to try a Lasix today and go home tomorrow Eyes: Lids and conjunctive are normal.  Sclera is anicteric ENMT: External ears nose appear normal frequency Neck: Appears supple with no JVD Respiratory: Remains diminished auscultation bilaterally with coarse breath sounds more so on the right compared to left.  Does have some crackles.  She is not tachypneic but she is wearing 3 L supplemental oxygen via nasal cannula today Cardiovascular: Regular rate and rhythm.  Has a 2 out of 6 systolic murmur.  Has some lower extremity edema Abdomen: Soft, nontender, nondistended.  Bowel sounds present GU: Deferred Musculoskeletal: No contractures or cyanosis.  Has a Port-A-Cath in place Skin: Skin is warm and dry no appreciable rashes or lesions on limited skin evaluation Neurologic: Cranial nerves II through XII grossly intact no appreciable focal deficits Psychiatric: Frustrated with her sister but has intact judgment insight.  She is awake, alert, oriented.  Data Reviewed: I have personally reviewed following labs and imaging studies  CBC: Recent Labs  Lab 06/17/18 1137 06/18/18 0616 06/19/18 0428 06/20/18 0446 06/21/18 0453  WBC 9.4 9.2 11.5* 13.8* 10.5  NEUTROABS 6.4 6.3 7.9* 11.7* 5.8  HGB 7.6* 7.3* 8.5*  8.4* 8.2*  HCT 24.6* 23.8* 26.3* 27.5* 27.0*  MCV 91.1 90.8 89.8 91.1 92.8  PLT 18* 13* 17* 20* 17*   Basic Metabolic Panel: Recent Labs  Lab 06/16/18 0232 06/17/18 0416 06/18/18 0616 06/19/18 0428 06/20/18 0446 06/21/18 0453  NA 136  --  138 138 138 139  K 3.5  --  2.5* 3.4* 3.1* 4.0  CL 98   --  100 101 100 96*  CO2 28  --  29 29 31  36*  GLUCOSE 158*  --  122* 151* 173* 116*  BUN 13  --  10 12 10 12   CREATININE 0.55  --  0.47 0.44 0.41* 0.48  CALCIUM 7.6*  --  7.8* 8.1* 8.2* 8.2*  MG 1.7 1.4* 1.7 2.0 1.8 1.9  PHOS  --   --  2.6 3.2 3.2 4.2   GFR: Estimated Creatinine Clearance: 67.5 mL/min (by C-G formula based on SCr of 0.48 mg/dL). Liver Function Tests: Recent Labs  Lab 06/18/18 0616 06/20/18 0446 06/21/18 0453  AST 16 22 22   ALT 12 15 15   ALKPHOS 156* 299* 332*  BILITOT 1.5* 1.8* 1.4*  PROT 5.0* 5.3* 5.7*  ALBUMIN 1.3* 1.3* 1.4*   No results for input(s): LIPASE, AMYLASE in the last 168 hours. No results for input(s): AMMONIA in the last 168 hours. Coagulation Profile: No results for input(s): INR, PROTIME in the last 168 hours. Cardiac Enzymes: No results for input(s): CKTOTAL, CKMB, CKMBINDEX, TROPONINI in the last 168 hours. BNP (last 3 results) No results for input(s): PROBNP in the last 8760 hours. HbA1C: No results for input(s): HGBA1C in the last 72 hours. CBG: Recent Labs  Lab 06/20/18 0721 06/20/18 1150 06/20/18 1707 06/21/18 0742 06/21/18 1118  GLUCAP 136* 151* 166* 105* 162*   Lipid Profile: No results for input(s): CHOL, HDL, LDLCALC, TRIG, CHOLHDL, LDLDIRECT in the last 72 hours. Thyroid Function Tests: No results for input(s): TSH, T4TOTAL, FREET4, T3FREE, THYROIDAB in the last 72 hours. Anemia Panel: No results for input(s): VITAMINB12, FOLATE, FERRITIN, TIBC, IRON, RETICCTPCT in the last 72 hours. Sepsis Labs: Recent Labs  Lab 06/16/18 0530  PROCALCITON 6.35    No results found for this or any previous visit (from the past 240 hour(s)).   RN Pressure Injury Documentation: Pressure Injury 06/11/18 Deep Tissue Injury - Purple or maroon localized area of discolored intact skin or blood-filled blister due to damage of underlying soft tissue from pressure and/or shear. (Active)  06/11/18 1900  Location: Sacrum  Location  Orientation: Mid  Staging: Deep Tissue Injury - Purple or maroon localized area of discolored intact skin or blood-filled blister due to damage of underlying soft tissue from pressure and/or shear.  Wound Description (Comments):   Present on Admission: Yes     Pressure Injury 06/12/18 Deep Tissue Injury - Purple or maroon localized area of discolored intact skin or blood-filled blister due to damage of underlying soft tissue from pressure and/or shear. Deep purple and pink (Active)  06/12/18 1600  Location: Heel  Location Orientation: Right  Staging: Deep Tissue Injury - Purple or maroon localized area of discolored intact skin or blood-filled blister due to damage of underlying soft tissue from pressure and/or shear.  Wound Description (Comments): Deep purple and pink  Present on Admission: Yes     Estimated body mass index is 25.93 kg/m as calculated from the following:   Height as of this encounter: 5\' 2"  (1.575 m).   Weight as of this encounter: 64.3 kg.  Malnutrition  Type:  Nutrition Problem: Increased nutrient needs Etiology: chronic illness, cancer and cancer related treatments   Malnutrition Characteristics:  Signs/Symptoms: estimated needs   Nutrition Interventions:  Interventions: Ensure Enlive (each supplement provides 350kcal and 20 grams of protein), MVI  Radiology Studies: Dg Chest Port 1 View  Result Date: 06/21/2018 CLINICAL DATA:  58 year old female with history of shortness of breath. EXAM: PORTABLE CHEST 1 VIEW COMPARISON:  Chest x-ray 06/20/2018. FINDINGS: Right internal jugular single-lumen porta cath with tip terminating in the distal superior vena cava. Lung volumes are low. Moderate right pleural effusion which appears partially loculated, with fluid extending into the apex of the right hemithorax. Small left pleural effusion. Bibasilar opacities (right greater than left), which may reflect areas of atelectasis and/or consolidation. No evidence of  pulmonary edema. Heart size is borderline enlarged. Upper mediastinal contours are within normal limits. IMPRESSION: 1. Support apparatus, as above. 2. Moderate partially loculated right pleural effusion. Small left pleural effusion. These are associated with bibasilar (right greater than left) areas of atelectasis and/or consolidation in the lung bases bilaterally. Electronically Signed   By: Vinnie Langton M.D.   On: 06/21/2018 07:26   Dg Chest Port 1 View  Result Date: 06/20/2018 CLINICAL DATA:  Shortness of breath. EXAM: PORTABLE CHEST 1 VIEW COMPARISON:  Radiograph Jun 19, 2018. FINDINGS: Stable cardiomegaly. No pneumothorax is noted. Right internal jugular Port-A-Cath is unchanged in position. Stable bilateral lung opacities are noted concerning for edema or atelectasis with associated pleural effusions, right greater than left. Bony thorax is unremarkable. IMPRESSION: Stable bilateral lung opacities concerning for edema or atelectasis with associated pleural effusions. Electronically Signed   By: Marijo Conception M.D.   On: 06/20/2018 08:22   Scheduled Meds: . sodium chloride   Intravenous Once  . amiodarone  200 mg Oral Daily  . Chlorhexidine Gluconate Cloth  6 each Topical Daily  . feeding supplement (ENSURE ENLIVE)  237 mL Oral TID BM  . insulin aspart  0-15 Units Subcutaneous TID WC  . insulin detemir  5 Units Subcutaneous QHS  . magic mouthwash w/lidocaine  5 mL Oral TID  . mouth rinse  15 mL Mouth Rinse BID  . metoprolol tartrate  12.5 mg Oral BID  . mirtazapine  15 mg Oral QHS  . montelukast  10 mg Oral Daily  . multivitamin with minerals  1 tablet Oral Daily  . pantoprazole  40 mg Oral Daily  . sodium chloride flush  10-40 mL Intracatheter Q12H   Continuous Infusions: . lactated ringers 10 mL/hr at 06/21/18 0427    LOS: 10 days   Kerney Elbe, DO [facility] PAGER is on AMION  If 7PM-7AM, please contact night-coverage [email or web address] Password [facility]  06/21/2018, 6:14 PM

## 2018-06-21 NOTE — TOC Progression Note (Signed)
Transition of Care [facility]) - Progression Note    Patient Details  Name: Stacy Shelton MRN: [id number] Date of Birth: [birth year]  Transition of Care [facility]) CM/SW [place], LCSW Phone Number: 06/21/2018, 3:17 PM  Clinical Narrative:   CSW met patient at bedside to discuss discharge plans. Patient stated she is ready to go home. Patient aware that she will discharge with home health and patient aware that her sister is trying to set up 24/7 care in the home. CSW spoke to patient about mental health resources. Patient stated "I want to go home and focus on seeing what I can and cant do before I start trying to meet with anyone". CSW stated she understand and if patient would like resources for outpatient therapy she can print one out for patient.        Expected Discharge Plan: [place] Barriers to Discharge: Continued Medical Work up  Expected Discharge Plan and Services Expected Discharge Plan: [place] arrangements for the past 2 months: Single Family Home Expected Discharge Date: (unknown)                                     Social Determinants of Health (SDOH) Interventions    Readmission Risk Interventions Readmission Risk Prevention Plan 06/13/2018 04/25/2018 04/25/2018  Transportation Screening Complete - Complete  Medication Review Press photographer) Complete - Complete  PCP or Specialist appointment within 3-5 days of discharge Not Complete (No Data) Complete  PCP/Specialist Appt Not Complete comments not close to discharge - -  [place] or Home Care Consult Complete - Complete  SW Recovery Care/Counseling Consult Not Complete - Complete  SW Consult Not Complete Comments no evidence of need at this time - -  Palliative Care Screening Not Applicable - Not [place] Not Applicable - Not Applicable  Some recent data might be hidden

## 2018-06-21 NOTE — Consult Note (Signed)
Consultation Note Date: 06/21/2018   Patient Name: Stacy Shelton  DOB: Aug 10, 1960  MRN: [id number]  Age / Sex: 58 y.o., female  PCP: Stacy Peers, MD Referring Physician: Kerney Elbe, DO  Reason for Consultation: Establishing goals of care  HPI/Patient Profile: 58 y.o. female  with past medical history of metastatic breast cancer admitted on 06/11/2018 with H CAP, SVT, acute renal failure, chronic pancytopenia and anemia.  Following initial admission, she had clinical decline and was transitioned to intensivist care where she received 3 units of packed red blood cells and vasopressors.  She was subsequently also diagnosed with pneumonia and started on IV antibiotics.  Her clinical course has been improving and she is approaching the point of discharge.  In chart review, her sister (who lives out of state) expressed concern about her and palliative consulted for goals of care.  Clinical Assessment and Goals of Care: Palliative care consult received.  Chart reviewed including personal review of pertinent labs and imaging.  Discussed with Dr. Alfredia Shelton.  I met today with Stacy Shelton.  She is pleasant and cooperative and has good understanding of her medical situation.     We discussed clinical course as well as wishes moving forward in regard to advanced directives.  She reports her plan is to transition from the hospital back to home with her mother and follow-up with Stacy Shelton for further treatment of her metastatic breast cancer.  She reports understanding that there is been expressed concern about her plan to transition home rather than to SNF for rehab, however, she reports that she is set in her decision to transition home.  She states that she has started the process of completing paperwork to name surrogate decision maker but has not signed it yet.  We reviewed that legally speaking, her mother would  currently be her surrogate decision maker and she states that this is her wish.  She reports that she is planning on completing paperwork will continue to name her mother as primary decision-maker (reports she is 58 years old but without any cognitive impairment and they discuss her situation regularly) but she is still deciding who she would like to name as backup if her mother is not able to make decisions.  Reports that it "may not" be her sister and that she will discuss further with a lawyer who has been helping her draft paperwork.  She also reports understanding that at the current time her sister would be her backup surrogate decision maker if she is not able to make her own medical decisions and her mother is not able to serve in this capacity as well.  I also discussed with her regarding recommendation for completion of a MOST form in order to outline her wishes moving forward for medical care.  She declines to do so at this time, however, she asked for copy to take with her to review at home.  Questions and concerns addressed.   PMT will continue to support holistically.  SUMMARY OF RECOMMENDATIONS   -  At this time, Stacy Shelton is capable of making her own medical decisions.  She states understanding concern and risk, however, she is planning on discharging home at end of this hospitalization. -She has been working with a Chief Executive Officer to name her surrogate decision maker if there is ever a point where she cannot make her own decisions.  She tells me that her mother would be her primary surrogate (this was also be the case legally at the current time) but she is working to determine who should be her backup in the event that her mother cannot act in this capacity.  Expressed that she knows her sister loves her dearly, however, they disagree on plan of care moving forward. - I understand that her sister has expressed concerns about plan for her to discharge home.  At this time, patient is capable of  making her own medical decisions and declined for me to call and speak with her sister or other family. -I recommended she have advanced care planning paperwork completed including naming surrogate decision-maker and consideration for completion of most form.  She trusts Stacy Shelton to continue to guide her regarding care plan and conversations regarding goals of care following discharge.  I did leave a copy of my card with her in case she wants to complete a most form prior to discharge or if she needs to call with any questions.  Code Status/Advance Care Planning: Full code   Additional Recommendations (Limitations, Scope, Preferences):  Full Scope Treatment  Psycho-social/Spiritual:   Desire for further Chaplaincy support:no  Additional Recommendations: Recommend completion of advance care planning documents  Prognosis:   Unable to determine  Discharge Planning: Home with Home Health      Primary Diagnoses: Present on Admission:  Severe anemia  Chronic diastolic CHF (congestive heart failure) (HCC)  Anemia associated with chemotherapy  Chemotherapy-induced thrombocytopenia  Thrombocytopenia (HCC)  Uncontrolled diabetes mellitus with hyperglycemia ([place])  Metastatic breast cancer ([place])  Metastasis to brain ([place])  Metastasis to bone ([place])  Sepsis ([place])  Cancer associated pain  Pneumonia due to infectious organism  Metastasis to lung ([place])  Acute renal failure (ARF) ([place])   I have reviewed the medical record, interviewed the patient and family, and examined the patient. The following aspects are pertinent.  Past Medical History:  Diagnosis Date   Asthma    Diabetes ([place])    Metastatic breast cancer ([place])    Seasonal allergies    Social History   Socioeconomic History   Marital status: Single    Spouse name: Not on file   Number of children: Not on file   Years of education: Not on file   Highest education level: Not on file  Occupational  History   Occupation: Engineer, site strain: Not on file   Food insecurity:    Worry: Not on file    Inability: Not on file   Transportation needs:    Medical: Not on file    Non-medical: Not on file  Tobacco Use   Smoking status: Never Smoker   Smokeless tobacco: Never Used  Substance and Sexual Activity   Alcohol use: Yes    Alcohol/week: 0.0 standard drinks    Comment: socially   Drug use: No   Sexual activity: Not on file  Lifestyle   Physical activity:    Days per week: Not on file    Minutes per session: Not on file   Stress: Not on file  Relationships  Social connections:    Talks on phone: Not on file    Gets together: Not on file    Attends religious service: Not on file    Active member of club or organization: Not on file    Attends meetings of clubs or organizations: Not on file    Relationship status: Not on file  Other Topics Concern   Not on file  Social History Narrative   Not on file   Family History  Problem Relation Age of Onset   Allergic rhinitis Neg Hx    Angioedema Neg Hx    Asthma Neg Hx    Atopy Neg Hx    Eczema Neg Hx    Immunodeficiency Neg Hx    Urticaria Neg Hx    Scheduled Meds:  sodium chloride   Intravenous Once   amiodarone  200 mg Oral Daily   Chlorhexidine Gluconate Cloth  6 each Topical Daily   feeding supplement (ENSURE ENLIVE)  237 mL Oral TID BM   insulin aspart  0-15 Units Subcutaneous TID WC   insulin detemir  5 Units Subcutaneous QHS   magic mouthwash w/lidocaine  5 mL Oral TID   mouth rinse  15 mL Mouth Rinse BID   metoprolol tartrate  12.5 mg Oral BID   mirtazapine  15 mg Oral QHS   montelukast  10 mg Oral Daily   multivitamin with minerals  1 tablet Oral Daily   pantoprazole  40 mg Oral Daily   sodium chloride flush  10-40 mL Intracatheter Q12H   Continuous Infusions:  lactated ringers 10 mL/hr at 06/21/18 0427   PRN  Meds:.acetaminophen **OR** acetaminophen, albuterol, benzonatate, chlorpheniramine-HYDROcodone, heparin lock flush, heparin lock flush, HYDROcodone-acetaminophen, menthol-cetylpyridinium, morphine injection, ondansetron **OR** ondansetron (ZOFRAN) IV, prochlorperazine, sodium chloride flush, sodium chloride flush, traZODone Medications Prior to Admission:  Prior to Admission medications   Medication Sig Start Date End Date Taking? Authorizing Provider  albuterol (PROAIR HFA) 108 (90 Base) MCG/ACT inhaler Inhale 2 puffs into the lungs every 6 (six) hours as needed for wheezing or shortness of breath. 05/16/17  Yes Padgett, Rae Halsted, MD  esomeprazole (NEXIUM) 40 MG capsule Take 1 capsule (40 mg total) by mouth 2 (two) times daily before a meal. 05/16/17  Yes Padgett, Rae Halsted, MD  fluticasone (FLOVENT HFA) 44 MCG/ACT inhaler Inhale 2 puffs into the lungs 2 (two) times daily. 05/16/17  Yes Padgett, Rae Halsted, MD  insulin aspart (NOVOLOG) 100 UNIT/ML FlexPen Inject 8 Units into the skin 3 (three) times daily with meals. Patient taking differently: Inject 8 Units into the skin 3 (three) times daily with meals. Holds dose if not going to eat. 06/04/18  Yes Gorsuch, Ni, MD  Insulin Detemir (LEVEMIR) 100 UNIT/ML Pen Inject 10 Units into the skin daily. Patient taking differently: Inject 10 Units into the skin at bedtime.  05/06/18  Yes Dahal, Marlowe Aschoff, MD  lidocaine-prilocaine (EMLA) cream Apply to affected area once Patient taking differently: Apply 1 application topically as needed (port access). Apply to affected area once 03/21/18  Yes Gorsuch, Ni, MD  LORazepam (ATIVAN) 0.5 MG tablet Take 1 tab po 30 minutes prior to radiation or MRI Patient taking differently: Take 0.5 mg by mouth See admin instructions. Take 30 minutes prior to radiation or MRI 03/14/18  Yes Hayden Pedro, PA-C  metFORMIN (GLUCOPHAGE) 500 MG tablet Take 500 mg by mouth daily with breakfast.    Yes [provider]  metoprolol succinate (TOPROL-XL) 50 MG 24 hr tablet Take  50 mg by mouth every evening. 03/10/18  Yes [provider]  montelukast (SINGULAIR) 10 MG tablet Take 1 tablet (10 mg total) by mouth daily. 06/10/18  Yes Padgett, Rae Halsted, MD  morphine (MSIR) 15 MG tablet Take 1 tablet (15 mg total) by mouth every 4 (four) hours as needed for severe pain. 05/20/18  Yes [name], MD  Multiple Vitamin (MULTIVITAMIN WITH MINERALS) TABS tablet Take 1 tablet by mouth daily. 05/07/18  Yes Dahal, Marlowe Aschoff, MD  ondansetron (ZOFRAN) 8 MG tablet Take 8 mg by mouth every 8 (eight) hours as needed for nausea or vomiting.    Yes [provider]  prochlorperazine (COMPAZINE) 10 MG tablet Take 10 mg by mouth every 6 (six) hours as needed for nausea or vomiting.    Yes [provider]  ACCU-CHEK AVIVA PLUS test strip  03/13/18   [provider]  ACCU-CHEK SOFTCLIX LANCETS lancets  03/13/18   [provider]  blood glucose meter kit and supplies KIT Dispense based on patient and insurance preference. Use up to four times daily as directed. (FOR ICD-9 250.00, 250.01). 03/13/18   Donne Hazel, MD  Insulin Pen Needle 31G X 5 MM MISC 1 Device by Does not apply route QID. For use with insulin pens 03/13/18   Donne Hazel, MD   Allergies  Allergen Reactions   Nsaids Anaphylaxis   Review of Systems  Constitutional: Positive for activity change, appetite change and fatigue.  Respiratory: Positive for cough.   Psychiatric/Behavioral: Positive for sleep disturbance.   Physical Exam General: Alert, awake, in no acute distress. Awake and alert with insight into her medical condition. HEENT: No bruits, no goiter, no JVD Heart: Regular rate and rhythm. Low grade murmur. Abdomen: Soft, nontender, nondistended, positive bowel sounds.  Ext: No significant edema Skin: Warm and dry Neuro: Grossly intact, nonfocal.   Vital Signs: BP (!) 120/57 (BP Location:  Right Arm)    Pulse (!) 102    Temp 98.3 F (36.8 C) (Oral)    Resp (!) 26    Ht 5' 2"  (1.575 m)    Wt 64.3 kg Comment: per bed scale   LMP 10/30/2010    SpO2 96%    BMI 25.93 kg/m  Pain Scale: 0-10 POSS *See Group Information*: S-Acceptable,Sleep, easy to arouse Pain Score: Asleep   SpO2: SpO2: 96 % O2 Device:SpO2: 96 % O2 Flow Rate: .O2 Flow Rate (L/min): 3 L/min  IO: Intake/output summary:   Intake/Output Summary (Last 24 hours) at 06/21/2018 1310 Last data filed at 06/21/2018 1255 Gross per 24 hour  Intake 250 ml  Output 2800 ml  Net -2550 ml    LBM: Last BM Date: 06/19/18 Baseline Weight: Weight: 59.9 kg Most recent weight: Weight: 64.3 kg(per bed scale)     Palliative Assessment/Data:   Flowsheet Rows     Most Recent Value  Intake Tab  Referral Department  Hospitalist  Unit at Time of Referral  Med/Surg Unit  Palliative Care Primary Diagnosis  Cancer  Date Notified  06/20/18  Palliative Care Type  New Palliative care  Reason for referral  Clarify Goals of Care  Date of Admission  06/11/18  Date first seen by Palliative Care  06/21/18  # of days Palliative referral response time  1 Day(s)  # of days IP prior to Palliative referral  9  Clinical Assessment  Palliative Performance Scale Score  60%  Psychosocial & Spiritual Assessment  Palliative Care Outcomes  Patient/Family meeting held?  Yes  Who was at the meeting?  patient  Palliative Care Outcomes  Clarified goals of care      Time In: 1200 Time Out: 1315 Time Total: 75 Greater than 50%  of this time was spent counseling and coordinating care related to the above assessment and plan.  Signed by: Micheline Rough, MD   Please contact Palliative Medicine Team phone at [phone] for questions and concerns.  For individual provider: See Shea Evans

## 2018-06-21 NOTE — Progress Notes (Signed)
Patient refusing COVID testing and thoracentesis

## 2018-06-21 NOTE — Progress Notes (Signed)
1700 CBG 112

## 2018-06-22 ENCOUNTER — Inpatient Hospital Stay (HOSPITAL_COMMUNITY): Payer: BLUE CROSS/BLUE SHIELD

## 2018-06-22 DIAGNOSIS — J181 Lobar pneumonia, unspecified organism: Secondary | ICD-10-CM

## 2018-06-22 LAB — CBC WITH DIFFERENTIAL/PLATELET
Abs Immature Granulocytes: 1.45 10*3/uL — ABNORMAL HIGH (ref 0.00–0.07)
Basophils Absolute: 0.3 10*3/uL — ABNORMAL HIGH (ref 0.0–0.1)
Basophils Relative: 3 %
Eosinophils Absolute: 0 10*3/uL (ref 0.0–0.5)
Eosinophils Relative: 1 %
HCT: 25.7 % — ABNORMAL LOW (ref 36.0–46.0)
Hemoglobin: 7.8 g/dL — ABNORMAL LOW (ref 12.0–15.0)
Immature Granulocytes: 17 %
Lymphocytes Relative: 20 %
Lymphs Abs: 1.7 10*3/uL (ref 0.7–4.0)
MCH: 28.1 pg (ref 26.0–34.0)
MCHC: 30.4 g/dL (ref 30.0–36.0)
MCV: 92.4 fL (ref 80.0–100.0)
Monocytes Absolute: 0.3 10*3/uL (ref 0.1–1.0)
Monocytes Relative: 4 %
Neutro Abs: 4.8 10*3/uL (ref 1.7–7.7)
Neutrophils Relative %: 55 %
Platelets: 17 10*3/uL — CL (ref 150–400)
RBC: 2.78 MIL/uL — ABNORMAL LOW (ref 3.87–5.11)
RDW: 15.8 % — ABNORMAL HIGH (ref 11.5–15.5)
WBC: 8.5 10*3/uL (ref 4.0–10.5)
nRBC: 0 % (ref 0.0–0.2)

## 2018-06-22 LAB — COMPREHENSIVE METABOLIC PANEL
ALT: 19 U/L (ref 0–44)
AST: 30 U/L (ref 15–41)
Albumin: 1.5 g/dL — ABNORMAL LOW (ref 3.5–5.0)
Alkaline Phosphatase: 455 U/L — ABNORMAL HIGH (ref 38–126)
Anion gap: 9 (ref 5–15)
BUN: 12 mg/dL (ref 6–20)
CO2: 38 mmol/L — ABNORMAL HIGH (ref 22–32)
Calcium: 8.1 mg/dL — ABNORMAL LOW (ref 8.9–10.3)
Chloride: 90 mmol/L — ABNORMAL LOW (ref 98–111)
Creatinine, Ser: 0.45 mg/dL (ref 0.44–1.00)
GFR calc Af Amer: >60 mL/min (ref >60)
GFR calc non Af Amer: >60 mL/min (ref >60)
Glucose, Bld: 140 mg/dL — ABNORMAL HIGH (ref 70–99)
Potassium: 3.8 mmol/L (ref 3.5–5.1)
Sodium: 137 mmol/L (ref 135–145)
Total Bilirubin: 1.3 mg/dL — ABNORMAL HIGH (ref 0.3–1.2)
Total Protein: 6 g/dL — ABNORMAL LOW (ref 6.5–8.1)

## 2018-06-22 LAB — GLUCOSE, CAPILLARY
Glucose-Capillary: 141 mg/dL — ABNORMAL HIGH (ref 70–99)
Glucose-Capillary: 202 mg/dL — ABNORMAL HIGH (ref 70–99)

## 2018-06-22 LAB — MAGNESIUM: Magnesium: 1.6 mg/dL — ABNORMAL LOW (ref 1.7–2.4)

## 2018-06-22 LAB — PHOSPHORUS: Phosphorus: 3.5 mg/dL (ref 2.5–4.6)

## 2018-06-22 MED ORDER — MAGIC MOUTHWASH W/LIDOCAINE: 5.0000 mL | Freq: Three times a day (TID) | ORAL | 0 refills | Status: DC

## 2018-06-22 MED ORDER — BENZONATATE 200 MG PO CAPS: 200.0000 mg | ORAL_CAPSULE | Freq: Three times a day (TID) | ORAL | 0 refills | Status: AC | PRN

## 2018-06-22 MED ORDER — METOPROLOL TARTRATE 25 MG PO TABS: 12.5000 mg | ORAL_TABLET | Freq: Two times a day (BID) | ORAL | 0 refills | Status: AC

## 2018-06-22 MED ORDER — AMIODARONE HCL 200 MG PO TABS: 200.0000 mg | ORAL_TABLET | Freq: Every day | ORAL | 0 refills | Status: DC

## 2018-06-22 MED ORDER — MIRTAZAPINE 15 MG PO TABS: 15.0000 mg | ORAL_TABLET | Freq: Every day | ORAL | 0 refills | Status: DC

## 2018-06-22 MED ORDER — ACETAMINOPHEN 325 MG PO TABS: 650.0000 mg | ORAL_TABLET | Freq: Four times a day (QID) | ORAL | 0 refills | Status: AC | PRN

## 2018-06-22 MED ORDER — MAGNESIUM SULFATE 2 GM/50ML IV SOLN
2.0000 g | Freq: Once | INTRAVENOUS | Status: AC
  Administered 2018-06-22: 10:00:00 2 g via INTRAVENOUS
  Filled 2018-06-22: qty 50

## 2018-06-22 MED ORDER — ENSURE ENLIVE PO LIQD: 237.0000 mL | Freq: Three times a day (TID) | ORAL | 12 refills | Status: AC

## 2018-06-22 NOTE — TOC Transition Note (Signed)
Transition of Care [facility]) - CM/SW Discharge Note   Patient Details  Name: Stacy Shelton MRN: [id number] Date of Birth: 05-22-1960  [facility]) CM/SW Contact:  Greg Cutter, LCSW Phone Number: 06/22/2018, 2:32 PM   Clinical Narrative:   Patient will discharge back home from hospital on 06/22/2018. CSW met with patient successfully. Patient prefers to use [facility] transportation and was made around that there will be a co pay for this service. Patient confirms to be on O2. CSW completed call to [employer] and successfully set up transportation arrangements. Nurse updated. Social Work will sign off at this time.    Barriers to Discharge: No Barriers Identified   Patient Goals and CMS Choice Patient states their goals for this hospitalization and ongoing recovery are:: To get better   Readmission Risk Interventions Readmission Risk Prevention Plan 06/13/2018 04/25/2018 04/25/2018  Transportation Screening Complete - Complete  Medication Review Press photographer) Complete - Complete  PCP or Specialist appointment within 3-5 days of discharge Not Complete (No Data) Complete  PCP/Specialist Appt Not Complete comments not close to discharge - -  [place] or Home Care Consult Complete - Complete  SW Recovery Care/Counseling Consult Not Complete - Complete  SW Consult Not Complete Comments no evidence of need at this time - -  Palliative Care Screening Not Applicable - Not [place] Not Applicable - Not Applicable  Some recent data might be hidden    Eula Fried, BSW, MSW, [email or web address] Phone: [phone]

## 2018-06-22 NOTE — Progress Notes (Signed)
No changes from am assessment. Pt remains a&ox4. Discharge instructions reviewed. Questions, concerns denied

## 2018-06-22 NOTE — Discharge Summary (Signed)
Physician Discharge Summary  Stacy Shelton HYQ:[id number] DOB: 1960-07-13 DOA: 06/11/2018  PCP: Robyne Peers, MD  Admit date: 06/11/2018 Discharge date: 06/22/2018  Admitted From: Home Disposition: Home with [place]  Recommendations for Outpatient Follow-up:  1. Follow up with PCP in 1-2 weeks 2. Follow up with Medical Oncology Dr. Alvy Bimler on Tuesday 3. Please obtain CMP/CBC, Mag, Phos in one week 4. Please follow up on the following pending results:  Home Health: Yes Equipment/Devices: None   Discharge Condition: Stable CODE STATUS: FULL CODE Diet recommendation: Heart Healthy   Brief/Interim Summary: Brief Narrative:  Stacy Shelton is a pleasant 58 year old woman with history of metastatic breast cancer who is currently undergoing chemotherapy by Dr. Ernst Spell with last session on 05/28/2018 (Taxotere, Herceptin, Perjeta). she was feeling fine until 06/11/2018 when she started having nausea, vomiting and generalized weakness and went to see her oncologist and was found to be having anemia with hemoglobin of 6.2 and acute kidney injury with creatinine of 1.63 and was hypotensive and hyperglycemic so she was sent to the emergency department for further management. She was initially admitted under hospitalist service but due to deterioration and requirement of vasopressors, she was transferred under PCCM/intensivist care. She received total of 3 units of PRBC and vasopressors for some time. She then he started having fever and was diagnosed with pneumonia and was started on IV Rocephin and Zithromax. She also developed some SVT on 06/14/2018 for which Cardiology was consulted. She was given a dose of adenosine and she was then started on amiodarone. Transthoracic echo did not show any acute pathology. She was transferred under hospitalist service on 06/14/2018. Her hemoglobin dropped again and she received 1 more unit of transfusion on 06/15/2018. Patient has been having intermittent  fever since then. Due to being a cancer patient, she qualified for HCAP so antibiotics were broadened to IV Zosyn. Now on 5 Liters of Supplemental O2 via [place] and will wean and give IV Lasix 40 mg today.    Patient no longer nauseous and feeling better. PT recommending SNF still but patient wanting to go home. Social Work in the process of setting up private help for the patient but family does not have that set up yet but patient does have Pharmacologist. Repeat CXR yesterday AM showed partially loculated Pleural Effusion so ordered thoracentesis but patient refused. Agreeable to Diuresis but did not really affect the Pleural Effusion. She remained Stable on O2 and was deemed stable to D/C home as she refused SNF and she has Home O2. She will follow up with Dr. Alvy Bimler on Tuesday.   Discharge Diagnoses:  Active Problems:   Thrombocytopenia ([place])   Uncontrolled diabetes mellitus with hyperglycemia (HCC)   Metastatic breast cancer (HCC)   Metastasis to brain ([place])   Metastasis to bone ([place])   Sepsis ([place])   Pneumonia due to infectious organism   Chronic diastolic CHF (congestive heart failure) (HCC)   Anemia associated with chemotherapy   Chemotherapy-induced thrombocytopenia   SVT (supraventricular tachycardia) (HCC)   Cancer associated pain   Metastasis to lung (HCC)   Acute renal failure (ARF) (HCC)   Severe anemia   Pressure injury of skin   Tachycardia   Acute diastolic heart failure (HCC)  Acute Hypoxic Respiratory Failure Secondary to healthcare associated pneumonia, improving  -Due to being a cancer patient receiving active chemotherapy, she qualifies for healthcare associated pneumonia. -Still having intermittent fever but this has improved from yesterday and TMax was 100 -  Now on 3-4 Liters of O2 via [place] and will wean as tolerated -C/w Benzonatate and Tussionex -All cultures remain negative. Zosyn stopped  -Received NS bolus on 5/18  -Repeat Dose of IV Lasix today and  give 40 mg BID yesterday  -CXR yesterday AM showed moderate partially loculated right pleural effusion and small left pleural effusion associated with bibasilar areas of atelectasis and/or consolidation in the bases bilaterally -CXR this AM showed "Right pleural effusion with atelectasis and probable superimposed consolidation in the right base region. Patchy atelectasis in the left base region is stable. No new opacity is evident compared to 1 day prior. Stable cardiac prominence. Aortic Atherosclerosis" -A thoracentesis was ordered for the patient however she refused and she did not want to be repeated for COVID Testing due to ultrasound department policy -Has been tested negative for COVID before -Urine antigen for Legionella and streptococci are negative -Procalcitonin improving.  -C/w Albuterol 2.5 mg Neb q4hprn Wheezing and SOB -Wean O2 and on 3-4 Liters; Has Home O2  -Diuresed yesterday and discharge home this a.m.  Patient is competent to make her own medical decisions though there may be poor choices she is wanting to go home and I spoke at length with the patient's sister today who feels that sending her home would be unsafe but I explained to her that after the diuresis there is no medical reason to keep her in the hospital as the patient has refused thoracentesis and can be followed in outpatient setting closely with Medical Oncology  SVT, improved  -? Chemotherapy-induced cardiomyopathy. -Continues to be in Sinus Rhythm. -Seen by Cardiology. -Continues to be on oral Metoprolol 12.5 mg po BID and Amiodarone 200 mg po Daily and will continue to be D/C -Monitor on telemetry.  -Echo with no changes.  Acute on Chronic Anemia -Again likely due to chemotherapy.  -Received total of 4 units of PRBC transfusion so far with the last being on 06/15/2018.  -Hemoglobin above 7 now and Hb/HCt is 7.8/25.7 -Medical Oncology on board.  Thrombocytopenia -Again likely due to chemotherapy but  now suspect worsened in the setting of IV Zosyn.  -Platelets  Went from 13 -> 17 -> 20 -> 17 -> 17 now. No signs of bleeding.  -Continue to Monitor daily.  -Medical Oncology on board.  Type 2 Diabetes Mellitus -Controlled. Had lows of 67 and 68 so will Reduce Levemir from 10 units to 5 units -Will D/C Novolog with Meals -Was on Insulin gtt and now off -C/w Moderate Novolog SSI 0-15 units TID -CBG's ranging from 141-249  Metastatic Breast Cancer -Management per Oncology Dr. Alvy Bimler -Has received 2 Cycles of Chemotherapy  -Will get Palliative for GOC Discussion and family involvement; Palliative discussed with the patient and Patient wants Her mother to be poA likely -Sister in the process of finding private duty help as patient refusing to go to SNF; Patient is going to be D/C'd home this AM   History of Asthma -Not in Acute exacerbation.  -Continue Montelukast 10 mg po Daily -C/w Albuterol 2.5 mg Neb q4hprn Wheezing and SOB  Hypokalemia -In the setting of IV Lasix  -Patient's K+ was 3.8 this AM  -Continue to Monitor and Replete as Necessary -Repeat CMP in AM   Severe Protein Calorie Malnutrition  -Nutritionist working Remotely -C/w Ensure [employer] TID -Encourage Po Intake  -C/w Mirtazapine 15 mg po qHS  Acute on Chronic Diastolic CHF -Given IV Lasix with Improvement and will give IV 40 mg BID yesterday and stopped  -  Strict I's/O's and Daily Weights -Patient is +42 mL since admision and Weight is  -5 lbs since admission -Continue to Monitor for S/Sx of Volume Overload -CXR yesterday AM showed Moderate Right Pleural Effusion but patient refused Thoracentesis; Repeat CXR this AM showed "Right pleural effusion with atelectasis and probable superimposed consolidation in the right base region. Patchy atelectasis in the left base region is stable. No new opacity is evident compared to 1 day prior. Stable cardiac prominence. Aortic Atherosclerosis" -Repeat CXR as an  outpatient   Leukocytosis, improved  -Patient's WBC went from 9.2 -> 11.5 -> 13.8 -> 10.5 -> 8.5 -IV Zosyn stopped  -Oncology believes this is rebound Leukocytosis in the setting of Chemotherapy -Continue to Monitor and Repeat CBC in AM   Hyperbilirubinemia -Patient's T Bili was 1.8 and now 1.3 -Likely Reactive  -Continue to Monitor and Trend  -Repeat CMP as an outpatient   Discharge Instructions  Discharge Instructions    Call MD for:  difficulty breathing, headache or visual disturbances   Complete by:  As directed    Call MD for:  extreme fatigue   Complete by:  As directed    Call MD for:  hives   Complete by:  As directed    Call MD for:  persistant dizziness or light-headedness   Complete by:  As directed    Call MD for:  persistant nausea and vomiting   Complete by:  As directed    Call MD for:  redness, tenderness, or signs of infection (pain, swelling, redness, odor or green/yellow discharge around incision site)   Complete by:  As directed    Call MD for:  severe uncontrolled pain   Complete by:  As directed    Call MD for:  temperature >100.4   Complete by:  As directed    Diet - low sodium heart healthy   Complete by:  As directed    Diet Carb Modified   Complete by:  As directed    Discharge instructions   Complete by:  As directed    You were cared for by a hospitalist during your hospital stay. If you have any questions about your discharge medications or the care you received while you were in the hospital after you are discharged, you can call the unit and ask to speak with the hospitalist on call if the hospitalist that took care of you is not available. Once you are discharged, your primary care physician will handle any further medical issues. Please note that NO REFILLS for any discharge medications will be authorized once you are discharged, as it is imperative that you return to your primary care physician (or establish a relationship with a primary  care physician if you do not have one) for your aftercare needs so that they can reassess your need for medications and monitor your lab values.  Follow up with PCP, Cardiology, and Medical Oncology. Take all medications as prescribed. If symptoms change or worsen please return to the ED for evaluation   Increase activity slowly   Complete by:  As directed      Allergies as of 06/22/2018      Reactions   Nsaids Anaphylaxis      Medication List    STOP taking these medications   metoprolol succinate 50 MG 24 hr tablet Commonly known as:  TOPROL-XL     TAKE these medications   Accu-Chek Aviva Plus test strip Generic drug:  glucose blood   Accu-Chek Softclix Lancets  lancets   acetaminophen 325 MG tablet Commonly known as:  TYLENOL Take 2 tablets (650 mg total) by mouth every 6 (six) hours as needed for mild pain (or Fever >/= 101).   albuterol 108 (90 Base) MCG/ACT inhaler Commonly known as:  ProAir HFA Inhale 2 puffs into the lungs every 6 (six) hours as needed for wheezing or shortness of breath.   amiodarone 200 MG tablet Commonly known as:  PACERONE Take 1 tablet (200 mg total) by mouth daily. Start taking on:  Jun 23, 2018   benzonatate 200 MG capsule Commonly known as:  TESSALON Take 1 capsule (200 mg total) by mouth 3 (three) times daily as needed for cough (use second).   blood glucose meter kit and supplies Kit Dispense based on patient and insurance preference. Use up to four times daily as directed. (FOR ICD-9 250.00, 250.01).   esomeprazole 40 MG capsule Commonly known as:  NEXIUM Take 1 capsule (40 mg total) by mouth 2 (two) times daily before a meal.   feeding supplement (ENSURE ENLIVE) Liqd Take 237 mLs by mouth 3 (three) times daily between meals.   fluticasone 44 MCG/ACT inhaler Commonly known as:  Flovent HFA Inhale 2 puffs into the lungs 2 (two) times daily.   insulin aspart 100 UNIT/ML FlexPen Commonly known as:  NOVOLOG Inject 8 Units into  the skin 3 (three) times daily with meals. What changed:  additional instructions   Insulin Detemir 100 UNIT/ML Pen Commonly known as:  LEVEMIR Inject 10 Units into the skin daily. What changed:  when to take this   Insulin Pen Needle 31G X 5 MM Misc 1 Device by Does not apply route QID. For use with insulin pens   lidocaine-prilocaine cream Commonly known as:  EMLA Apply to affected area once What changed:    how much to take  how to take this  when to take this  reasons to take this   LORazepam 0.5 MG tablet Commonly known as:  Ativan Take 1 tab po 30 minutes prior to radiation or MRI What changed:    how much to take  how to take this  when to take this  additional instructions   magic mouthwash w/lidocaine Soln Take 5 mLs by mouth 3 (three) times daily.   metFORMIN 500 MG tablet Commonly known as:  GLUCOPHAGE Take 500 mg by mouth daily with breakfast.   metoprolol tartrate 25 MG tablet Commonly known as:  LOPRESSOR Take 0.5 tablets (12.5 mg total) by mouth 2 (two) times daily.   mirtazapine 15 MG tablet Commonly known as:  REMERON Take 1 tablet (15 mg total) by mouth at bedtime.   montelukast 10 MG tablet Commonly known as:  SINGULAIR Take 1 tablet (10 mg total) by mouth daily.   morphine 15 MG tablet Commonly known as:  MSIR Take 1 tablet (15 mg total) by mouth every 4 (four) hours as needed for severe pain.   multivitamin with minerals Tabs tablet Take 1 tablet by mouth daily.   ondansetron 8 MG tablet Commonly known as:  ZOFRAN Take 8 mg by mouth every 8 (eight) hours as needed for nausea or vomiting.   prochlorperazine 10 MG tablet Commonly known as:  COMPAZINE Take 10 mg by mouth every 6 (six) hours as needed for nausea or vomiting.       Allergies  Allergen Reactions  . Nsaids Anaphylaxis   Consultations:  Medical Oncology  PCCM Transfer  Cardiology  Palliative Care Medicine  Procedures/Studies: Ct  Angio Chest Pe W Or  Wo Contrast  Result Date: 06/11/2018 CLINICAL DATA:  Shortness of breath. Metastatic breast cancer. EXAM: CT ANGIOGRAPHY CHEST WITH CONTRAST TECHNIQUE: Multidetector CT imaging of the chest was performed using the standard protocol during bolus administration of intravenous contrast. Multiplanar CT image reconstructions and MIPs were obtained to evaluate the vascular anatomy. CONTRAST:  86m OMNIPAQUE IOHEXOL 350 MG/ML SOLN COMPARISON:  Chest radiograph 06/11/2018. Thoracic spine CT 05/19/2018. Chest CTA 04/27/2018. FINDINGS: Cardiovascular: Pulmonary arterial opacification is adequate without evidence of lobar more proximal emboli. No segmental emboli are identified although assessment is limited in some areas due to motion artifact. Mild thoracic aortic atherosclerosis is noted without aneurysm. The heart is enlarged. There is no pericardial effusion. A right jugular Port-A-Cath terminates in the high right atrium. Mediastinum/Nodes: Calcified subcarinal and right hilar lymph nodes are again noted. Mildly enlarged noncalcified lymph nodes including a 10 mm short axis right paratracheal lymph node, levin mm precarinal lymph node, an 11 mm right hilar lymph node are unchanged from the prior chest CTA. The esophagus and thyroid are unremarkable. Lungs/Pleura: There are small bilateral pleural effusions, unchanged on the right and decreased in size on the left compared to the prior chest CTA. Extensive right lower lobe consolidation has greatly progressed from the prior chest CTA with progression also noted compared to the more recent thoracic spine CT. There is mildly improved aeration of the left lower lobe compared to the prior chest CTA, however moderately extensive consolidation and volume loss remain. A 2.5 x 2.2 cm right upper lobe nodule is unchanged from the thoracic spine CT though has enlarged from the chest CTA (series 10, image 51), as has an 8 mm nodule more anterolaterally in the right upper lobe  (series 10, image 50). Multiple subcentimeter nodules are present in the left upper lobe with some being larger compared to the prior chest CT and others being unchanged. Upper Abdomen: Liver lesions have decreased in size, for example a segment VII/VIII lesion now measures 16 mm (series 4, image 85, previously 23 mm). Calcified granulomas are noted in the spleen. Musculoskeletal: Unchanged subcentimeter sclerotic foci in the T11 and T12 vertebral bodies, left glenoid, and inferior right scapular body. Review of the MIP images confirms the above findings. IMPRESSION: 1. No evidence of pulmonary emboli. 2. Extensive bilateral lower lobe consolidation. 3. Small pleural effusions. 4. 2.5 cm right upper lobe nodule, enlarged from 04/27/2018 and which may reflect a metastasis or primary lung cancer. Stable to mild enlargement of subcentimeter nodules in both upper lobes consistent with known metastases. 5. Decreased size of liver metastases. 6. Unchanged mild mediastinal and right hilar lymphadenopathy. 7. Aortic Atherosclerosis (ICD10-I70.0). Electronically Signed   By: ALogan BoresM.D.   On: 06/11/2018 17:02   Dg Chest Port 1 View  Result Date: 06/22/2018 CLINICAL DATA:  Shortness of breath EXAM: PORTABLE CHEST 1 VIEW COMPARISON:  Jun 21, 2018 FINDINGS: Port-A-Cath tip is in the superior vena cava of the cavoatrial junction. No pneumothorax. Right pleural effusion which may be partially loculated persists. There is patchy bibasilar atelectasis. There may be a degree of superimposed consolidation in the right base. Heart is mildly enlarged with pulmonary vascularity normal. No adenopathy. There is aortic atherosclerosis. No bone lesions. IMPRESSION: Right pleural effusion with atelectasis and probable superimposed consolidation in the right base region. Patchy atelectasis in the left base region is stable. No new opacity is evident compared to 1 day prior. Stable cardiac prominence. Aortic Atherosclerosis  (ICD10-I70.0). Electronically  Signed   By: Lowella Grip III M.D.   On: 06/22/2018 08:00   Dg Chest Port 1 View  Result Date: 06/21/2018 CLINICAL DATA:  57 year old female with history of shortness of breath. EXAM: PORTABLE CHEST 1 VIEW COMPARISON:  Chest x-ray 06/20/2018. FINDINGS: Right internal jugular single-lumen porta cath with tip terminating in the distal superior vena cava. Lung volumes are low. Moderate right pleural effusion which appears partially loculated, with fluid extending into the apex of the right hemithorax. Small left pleural effusion. Bibasilar opacities (right greater than left), which may reflect areas of atelectasis and/or consolidation. No evidence of pulmonary edema. Heart size is borderline enlarged. Upper mediastinal contours are within normal limits. IMPRESSION: 1. Support apparatus, as above. 2. Moderate partially loculated right pleural effusion. Small left pleural effusion. These are associated with bibasilar (right greater than left) areas of atelectasis and/or consolidation in the lung bases bilaterally. Electronically Signed   By: Vinnie Langton M.D.   On: 06/21/2018 07:26   Dg Chest Port 1 View  Result Date: 06/20/2018 CLINICAL DATA:  Shortness of breath. EXAM: PORTABLE CHEST 1 VIEW COMPARISON:  Radiograph Jun 19, 2018. FINDINGS: Stable cardiomegaly. No pneumothorax is noted. Right internal jugular Port-A-Cath is unchanged in position. Stable bilateral lung opacities are noted concerning for edema or atelectasis with associated pleural effusions, right greater than left. Bony thorax is unremarkable. IMPRESSION: Stable bilateral lung opacities concerning for edema or atelectasis with associated pleural effusions. Electronically Signed   By: Marijo Conception M.D.   On: 06/20/2018 08:22   Dg Chest Port 1 View  Result Date: 06/19/2018 CLINICAL DATA:  History of metastatic breast cancer. EXAM: PORTABLE CHEST 1 VIEW COMPARISON:  Jun 17, 2018 FINDINGS: Right  Port-A-Cath is stable. Bilateral pleural effusions, right greater than left with underlying atelectasis/consolidation. Cardiomediastinal silhouette is stable. IMPRESSION: No interval change in bilateral pleural effusions with underlying atelectasis/consolidation. Stable right Port-A-Cath. Electronically Signed   By: Dorise Bullion III M.D   On: 06/19/2018 10:10   Dg Chest Port 1 View  Result Date: 06/17/2018 CLINICAL DATA:  58 year old female with respiratory distress EXAM: PORTABLE CHEST 1 VIEW COMPARISON:  06/16/2018, 06/12/2018, CT 06/11/2018 FINDINGS: Cardiomediastinal silhouette likely unchanged in size and contour. Heart borders partially obscured by overlying lung and pleural disease. Opacities at the bilateral lung bases obscuring the hemidiaphragms and heart borders, appears unchanged from prior. Graded opacities towards the bases. Right hilar opacity persists. Partially calcified nodes at the right hilum. No pneumothorax. Right IJ port catheter. No displaced fracture IMPRESSION: Similar appearance of the chest x-ray, with bilateral right greater than left pleural effusions and associated atelectasis/consolidation. Right hilar opacities better seen on recent CT imaging. Unchanged right IJ port catheter. Electronically Signed   By: Corrie Mckusick D.O.   On: 06/17/2018 08:05   Dg Chest Port 1 View  Result Date: 06/16/2018 CLINICAL DATA:  Metastatic breast cancer pneumonia EXAM: PORTABLE CHEST 1 VIEW COMPARISON:  06/12/2018 FINDINGS: Right-sided Port-A-Cath with the tip projecting over the SVC. Small right pleural effusion. Bilateral lower lobe airspace disease most concerning for pneumonia. Persistent right upper lobe pulmonary nodule consistent malignancy. No pneumothorax. Stable cardiomediastinal silhouette. No aggressive osseous lesion. IMPRESSION: 1. Bilateral lower lobe airspace disease most consistent with pneumonia. Small right pleural effusion. 2. Persistent right upper lobe pulmonary nodule  consistent with malignancy. Electronically Signed   By: Kathreen Devoid   On: 06/16/2018 08:49   Dg Chest Port 1 View  Result Date: 06/12/2018 CLINICAL DATA:  58 year old female with  possible pneumonia in cancer patient. EXAM: PORTABLE CHEST 1 VIEW COMPARISON:  06/11/2018 and earlier. FINDINGS: Portable AP semi upright view at 0943 hours. Stable right chest power port, accessed. Increasing confluence of mid and lower lung opacity. Consolidation demonstrated by CT yesterday. Trace pleural fluid more apparent by CT. No superimposed pneumothorax or pulmonary edema. Stable cardiac size and mediastinal contours. Visualized tracheal air column is within normal limits. IMPRESSION: Mild radiographic progression of bilateral consolidation/pneumonia since yesterday. Electronically Signed   By: Genevie Ann M.D.   On: 06/12/2018 10:37   Dg Chest Port 1 View  Result Date: 06/11/2018 CLINICAL DATA:  Hypotension. EXAM: PORTABLE CHEST 1 VIEW COMPARISON:  Radiograph of May 19, 2018. FINDINGS: Stable cardiomediastinal silhouette. Right internal jugular Port-A-Cath is unchanged in position. No pneumothorax is noted. Increased bibasilar opacities are noted concerning for pneumonia or edema. Small pleural effusions may be present. Bony thorax is unremarkable. IMPRESSION: Increased bibasilar opacities are noted concerning for worsening pneumonia or edema. Small pleural effusions may be present. Electronically Signed   By: Marijo Conception M.D.   On: 06/11/2018 11:43   ECHOCARDIOGRAM on 06/14/2018 IMPRESSIONS    1. The left ventricle has normal systolic function, with an ejection fraction of 60-65%. The cavity size was normal. Left ventricular diastolic function could not be evaluated due to nondiagnostic images. No evidence of left ventricular regional wall  motion abnormalities.  2. The right ventricle has normal systolc function. The cavity was normal. There is no increase in right ventricular wall thickness.  3. Right  atrial size was mildly dilated.  4. There is prominent interventricular septal bounce that is not related to respirations.  5. The aortic valve is tricuspid.  6. Pulmonic valve regurgitation was not assessed by color flow Doppler.  7. There is significant interventricular septal bounce that is more prominent in diastole. There is a trivial posterior pericardial effusion. Differential diagnosis includes acute PE, acute pulmonary process or constrictive pericarditis. Recommend  repeat echo with respirometer outpatient once respiratory illness resolved. If still present then consider cardiac MRI to rule out contrictive pericarditis.  FINDINGS  Left Ventricle: The left ventricle has normal systolic function, with an ejection fraction of 60-65%. The cavity size was normal. There is no increase in left ventricular wall thickness. Left ventricular diastolic function could not be evaluated due to  nondiagnostic images. No evidence of left ventricular regional wall motion abnormalities.    Right Ventricle: The right ventricle has normal systolic function. The cavity was normal. There is no increase in right ventricular wall thickness.  Left Atrium: Left atrial size was normal in size.  Right Atrium: Right atrial size was mildly dilated. Right atrial pressure is estimated at 3 mmHg.  Interatrial Septum: No atrial level shunt detected by color flow Doppler.  Pericardium: There is no evidence of pericardial effusion. There is septal bounce. There is significant interventricular septal bounce that is more prominent in diastole. There is a trivial posterior pericardial effusion. Differential diagnosis includes  acute PE, acute pulmonary process or constrictive pericarditis. Recommend repeat echo with respirometer outpatient once respiratory illness resolved. If still present then consider cardiac MRI to rule out contrictive pericarditis.  Mitral Valve: The mitral valve is normal in structure. Mitral  valve regurgitation is mild by color flow Doppler.  Tricuspid Valve: The tricuspid valve was normal in structure. Tricuspid valve regurgitation is mild by color flow Doppler.  Aortic Valve: The aortic valve is tricuspid Aortic valve regurgitation was not visualized by color flow Doppler.  Pulmonic Valve: The pulmonic valve was normal in structure. Pulmonic valve regurgitation was not assessed by color flow Doppler.  Venous: The inferior vena cava is normal in size with greater than 50% respiratory variability.    +--------------+--------++ LEFT VENTRICLE         +--------------+--------++ PLAX 2D                +--------------+--------++ LVIDd:        4.41 cm  +--------------+--------++ LVIDs:        3.22 cm  +--------------+--------++ LV PW:        0.94 cm  +--------------+--------++ LV IVS:       0.69 cm  +--------------+--------++ LVOT diam:    1.80 cm  +--------------+--------++ LV SV:        47 ml    +--------------+--------++ LV SV Index:  28.42    +--------------+--------++ LVOT Area:    2.54 cm +--------------+--------++                        +--------------+--------++  +---------------+---------++ RIGHT VENTRICLE          +---------------+---------++ RV Basal diam: 2.64 cm   +---------------+---------++ RVSP:          29.4 mmHg +---------------+---------++  +---------------+-------++-----------++ LEFT ATRIUM           Index       +---------------+-------++-----------++ LA diam:       3.70 cm2.30 cm/m  +---------------+-------++-----------++ LA Vol (A2C):  41.7 ml25.89 ml/m +---------------+-------++-----------++ LA Vol (A4C):  40.3 ml25.02 ml/m +---------------+-------++-----------++ LA Biplane Vol:43.4 ml26.95 ml/m +---------------+-------++-----------++ +------------+---------++-----------++ RIGHT ATRIUM         Index        +------------+---------++-----------++ RA Pressure:3.00 mmHg            +------------+---------++-----------++ RA Area:    16.70 cm            +------------+---------++-----------++ RA Volume:  44.70 ml 27.76 ml/m +------------+---------++-----------++  +------------+-----------++ AORTIC VALVE            +------------+-----------++ LVOT Vmax:  138.00 cm/s +------------+-----------++ LVOT Vmean: 92.600 cm/s +------------+-----------++ LVOT VTI:   0.253 m     +------------+-----------++   +-------------+-------++ AORTA                +-------------+-------++ Ao Root diam:2.70 cm +-------------+-------++ Ao Asc diam: 2.50 cm +-------------+-------++  +--------------+----------++ +---------------+-----------++ MITRAL VALVE             TRICUSPID VALVE            +--------------+----------++ +---------------+-----------++ MV Area (PHT):4.06 cm   TR Peak grad:  26.4 mmHg   +--------------+----------++ +---------------+-----------++ MV PHT:       54.23 msec TR Vmax:       257.00 cm/s +--------------+----------++ +---------------+-----------++ MV Decel Time:187 msec   Estimated RAP: 3.00 mmHg   +--------------+----------++ +---------------+-----------++ +--------------+----------++ RVSP:          29.4 mmHg   MV E velocity:99.80 cm/s +---------------+-----------++ +--------------+----------++ MV A velocity:93.40 cm/s +--------------+-------+ +--------------+----------++ SHUNTS                MV E/A ratio: 1.07       +--------------+-------+ +--------------+----------++ Systemic VTI: 0.25 m                               +--------------+-------+  Systemic Diam:1.80 cm                              +--------------+-------+  Subjective: And examined at bedside and she states she had a good night.  Denies any respiratory distress and stable for  discharge.  Denies any shortness breath nausea vomiting, lightheadedness or dizziness denies any chest pain.  Understands that she will follow-up with PCP and understands if she is going home may get worse but she is competent and has a full capacity make decisions.  She has no other concerns or complaints at this time.  Discharge Exam: Vitals:   06/22/18 1003 06/22/18 1507  BP: 130/64 (!) 141/72  Pulse: 96 (!) 102  Resp: 16 20  Temp: 98.4 F (36.9 C) 98.2 F (36.8 C)  SpO2: 98% 99%   Vitals:   06/22/18 0500 06/22/18 0507 06/22/18 1003 06/22/18 1507  BP:  130/63 130/64 (!) 141/72  Pulse:  99 96 (!) 102  Resp:  _0 Temp:  98 F (36.7 C) 98.4 F (36.9 C) 98.2 F (36.8 C)  TempSrc:  Oral Oral Oral  SpO2:  97% 98% 99%  Weight: 57.7 kg     Height:       General: Pt is alert, awake, not in acute distress Cardiovascular: RRR, S1/S2 +, no rubs, no gallops Respiratory: Diminished bilaterally but worse on the Right. Unlabored breathing and wearing 4 Liters  Abdominal: Soft, NT, ND, bowel sounds + Extremities: Trace edema, no cyanosis  The results of significant diagnostics from this hospitalization (including imaging, microbiology, ancillary and laboratory) are listed below for reference.    Microbiology: No results found for this or any previous visit (from the past 240 hour(s)).   Labs: BNP (last 3 results) Recent Labs    06/11/18 2030  BNP 786.7*   Basic Metabolic Panel: Recent Labs  Lab 06/18/18 0616 06/19/18 0428 06/20/18 0446 06/21/18 0453 06/22/18 0440  NA 138 138 138 139 137  K 2.5* 3.4* 3.1* 4.0 3.8  CL 100 101 100 96* 90*  CO2 _1 36* 38*  GLUCOSE 122* 151* 173* 116* 140*  BUN _2 CREATININE 0.47 0.44 0.41* 0.48 0.45  CALCIUM 7.8* 8.1* 8.2* 8.2* 8.1*  MG 1.7 2.0 1.8 1.9 1.6*  PHOS 2.6 3.2 3.2 4.2 3.5   Liver Function Tests: Recent Labs  Lab 06/18/18 0616 06/20/18 0446 06/21/18 0453 06/22/18 0440  AST _3 ALT _4 ALKPHOS 156* 299* 332* 455*  BILITOT 1.5* 1.8* 1.4* 1.3*  PROT 5.0* 5.3* 5.7* 6.0*  ALBUMIN 1.3* 1.3* 1.4* 1.5*   No results for input(s): LIPASE, AMYLASE in the last 168 hours. No results for input(s): AMMONIA in the last 168 hours. CBC: Recent Labs  Lab 06/18/18 0616 06/19/18 0428 06/20/18 0446 06/21/18 0453 06/22/18 0440  WBC 9.2 11.5* 13.8* 10.5 8.5  NEUTROABS 6.3 7.9* 11.7* 5.8 4.8  HGB 7.3* 8.5* 8.4* 8.2* 7.8*  HCT 23.8* 26.3* 27.5* 27.0* 25.7*  MCV 90.8 89.8 91.1 92.8 92.4  PLT 13* 17* 20* 17* 17*   Cardiac Enzymes: No results for input(s): CKTOTAL, CKMB, CKMBINDEX, TROPONINI in the last 168 hours. BNP: Invalid input(s): POCBNP CBG: Recent Labs  Lab 06/21/18 0742 06/21/18 1118 06/21/18 2106 06/22/18 0743 06/22/18 1137  GLUCAP 105* 162* 249* 141* 202*   D-Dimer No results for input(s): DDIMER in the last  72 hours. Hgb A1c No results for input(s): HGBA1C in the last 72 hours. Lipid Profile No results for input(s): CHOL, HDL, LDLCALC, TRIG, CHOLHDL, LDLDIRECT in the last 72 hours. Thyroid function studies No results for input(s): TSH, T4TOTAL, T3FREE, THYROIDAB in the last 72 hours.  Invalid input(s): FREET3 Anemia work up No results for input(s): VITAMINB12, FOLATE, FERRITIN, TIBC, IRON, RETICCTPCT in the last 72 hours. Urinalysis    Component Value Date/Time   COLORURINE YELLOW 06/11/2018 1055   APPEARANCEUR HAZY (A) 06/11/2018 1055   LABSPEC 1.021 06/11/2018 1055   PHURINE 5.0 06/11/2018 1055   GLUCOSEU 50 (A) 06/11/2018 1055   HGBUR MODERATE (A) 06/11/2018 1055   BILIRUBINUR NEGATIVE 06/11/2018 1055   KETONESUR NEGATIVE 06/11/2018 1055   PROTEINUR 30 (A) 06/11/2018 1055   NITRITE NEGATIVE 06/11/2018 1055   LEUKOCYTESUR SMALL (A) 06/11/2018 1055   Sepsis Labs Invalid input(s): PROCALCITONIN,  WBC,  LACTICIDVEN Microbiology No results found for this or any previous visit (from the past 240 hour(s)).  Time coordinating discharge: 35  minutes  SIGNED:  Kerney Elbe, DO [facility] 06/22/2018, 8:55 PM Pager is on [place]  If 7PM-7AM, please contact night-coverage [email or web address] Password [email or web address]

## 2018-06-24 ENCOUNTER — Other Ambulatory Visit: Payer: Self-pay | Admitting: Radiation Therapy

## 2018-06-26 ENCOUNTER — Inpatient Hospital Stay: Payer: BLUE CROSS/BLUE SHIELD

## 2018-06-26 ENCOUNTER — Telehealth: Payer: Self-pay

## 2018-06-26 ENCOUNTER — Other Ambulatory Visit: Payer: Self-pay

## 2018-06-26 ENCOUNTER — Encounter: Payer: Self-pay | Admitting: Hematology and Oncology

## 2018-06-26 ENCOUNTER — Inpatient Hospital Stay (HOSPITAL_BASED_OUTPATIENT_CLINIC_OR_DEPARTMENT_OTHER): Payer: BLUE CROSS/BLUE SHIELD | Admitting: Hematology and Oncology

## 2018-06-26 VITALS — BP 96/58 | HR 110 | Temp 98.3°F | Resp 18

## 2018-06-26 DIAGNOSIS — Z79899 Other long term (current) drug therapy: Secondary | ICD-10-CM | POA: Diagnosis not present

## 2018-06-26 DIAGNOSIS — E440 Moderate protein-calorie malnutrition: Secondary | ICD-10-CM | POA: Diagnosis not present

## 2018-06-26 DIAGNOSIS — C7951 Secondary malignant neoplasm of bone: Secondary | ICD-10-CM

## 2018-06-26 DIAGNOSIS — I471 Supraventricular tachycardia: Secondary | ICD-10-CM

## 2018-06-26 DIAGNOSIS — C787 Secondary malignant neoplasm of liver and intrahepatic bile duct: Secondary | ICD-10-CM

## 2018-06-26 DIAGNOSIS — Z794 Long term (current) use of insulin: Secondary | ICD-10-CM | POA: Diagnosis not present

## 2018-06-26 DIAGNOSIS — D61818 Other pancytopenia: Secondary | ICD-10-CM | POA: Diagnosis present

## 2018-06-26 DIAGNOSIS — R112 Nausea with vomiting, unspecified: Secondary | ICD-10-CM | POA: Diagnosis not present

## 2018-06-26 DIAGNOSIS — D6481 Anemia due to antineoplastic chemotherapy: Secondary | ICD-10-CM

## 2018-06-26 DIAGNOSIS — C7800 Secondary malignant neoplasm of unspecified lung: Secondary | ICD-10-CM | POA: Diagnosis not present

## 2018-06-26 DIAGNOSIS — C50919 Malignant neoplasm of unspecified site of unspecified female breast: Secondary | ICD-10-CM

## 2018-06-26 DIAGNOSIS — C7931 Secondary malignant neoplasm of brain: Secondary | ICD-10-CM

## 2018-06-26 DIAGNOSIS — K5909 Other constipation: Secondary | ICD-10-CM

## 2018-06-26 DIAGNOSIS — N179 Acute kidney failure, unspecified: Secondary | ICD-10-CM

## 2018-06-26 DIAGNOSIS — R5381 Other malaise: Secondary | ICD-10-CM

## 2018-06-26 DIAGNOSIS — C50312 Malignant neoplasm of lower-inner quadrant of left female breast: Secondary | ICD-10-CM

## 2018-06-26 DIAGNOSIS — M549 Dorsalgia, unspecified: Secondary | ICD-10-CM

## 2018-06-26 DIAGNOSIS — E1165 Type 2 diabetes mellitus with hyperglycemia: Secondary | ICD-10-CM | POA: Diagnosis not present

## 2018-06-26 DIAGNOSIS — Z171 Estrogen receptor negative status [ER-]: Secondary | ICD-10-CM

## 2018-06-26 DIAGNOSIS — C7801 Secondary malignant neoplasm of right lung: Secondary | ICD-10-CM

## 2018-06-26 LAB — CMP (CANCER CENTER ONLY)
ALT: 26 U/L (ref 0–44)
AST: 29 U/L (ref 15–41)
Albumin: 1.9 g/dL — ABNORMAL LOW (ref 3.5–5.0)
Alkaline Phosphatase: 434 U/L — ABNORMAL HIGH (ref 38–126)
Anion gap: 9 (ref 5–15)
BUN: 17 mg/dL (ref 6–20)
CO2: 30 mmol/L (ref 22–32)
Calcium: 9 mg/dL (ref 8.9–10.3)
Chloride: 94 mmol/L — ABNORMAL LOW (ref 98–111)
Creatinine: 0.68 mg/dL (ref 0.44–1.00)
GFR, Est AFR Am: >60 mL/min (ref >60)
GFR, Estimated: >60 mL/min (ref >60)
Glucose, Bld: 122 mg/dL — ABNORMAL HIGH (ref 70–99)
Potassium: 4.2 mmol/L (ref 3.5–5.1)
Sodium: 133 mmol/L — ABNORMAL LOW (ref 135–145)
Total Bilirubin: 0.8 mg/dL (ref 0.3–1.2)
Total Protein: 7.9 g/dL (ref 6.5–8.1)

## 2018-06-26 LAB — SAMPLE TO BLOOD BANK

## 2018-06-26 LAB — CBC WITH DIFFERENTIAL (CANCER CENTER ONLY)
Abs Immature Granulocytes: 0.85 10*3/uL — ABNORMAL HIGH (ref 0.00–0.07)
Basophils Absolute: 0.3 10*3/uL — ABNORMAL HIGH (ref 0.0–0.1)
Basophils Relative: 4 %
Eosinophils Absolute: 0 10*3/uL (ref 0.0–0.5)
Eosinophils Relative: 0 %
HCT: 27.2 % — ABNORMAL LOW (ref 36.0–46.0)
Hemoglobin: 8.5 g/dL — ABNORMAL LOW (ref 12.0–15.0)
Immature Granulocytes: 11 %
Lymphocytes Relative: 21 %
Lymphs Abs: 1.6 10*3/uL (ref 0.7–4.0)
MCH: 28.5 pg (ref 26.0–34.0)
MCHC: 31.3 g/dL (ref 30.0–36.0)
MCV: 91.3 fL (ref 80.0–100.0)
Monocytes Absolute: 0.8 10*3/uL (ref 0.1–1.0)
Monocytes Relative: 10 %
Neutro Abs: 4 10*3/uL (ref 1.7–7.7)
Neutrophils Relative %: 54 %
Platelet Count: 27 10*3/uL — ABNORMAL LOW (ref 150–400)
RBC: 2.98 MIL/uL — ABNORMAL LOW (ref 3.87–5.11)
RDW: 15.7 % — ABNORMAL HIGH (ref 11.5–15.5)
WBC Count: 7.5 10*3/uL (ref 4.0–10.5)
nRBC: 0.3 % — ABNORMAL HIGH (ref 0.0–0.2)

## 2018-06-26 MED ORDER — SODIUM CHLORIDE 0.9% FLUSH
10.0000 mL | Freq: Once | INTRAVENOUS | Status: AC | PRN
  Administered 2018-06-26: 09:00:00 10 mL
  Filled 2018-06-26: qty 10

## 2018-06-26 MED ORDER — SODIUM CHLORIDE 0.9 % IV SOLN
Freq: Once | INTRAVENOUS | Status: DC
  Filled 2018-06-26: qty 250

## 2018-06-26 MED ORDER — HEPARIN SOD (PORK) LOCK FLUSH 100 UNIT/ML IV SOLN
500.0000 [IU] | Freq: Once | INTRAVENOUS | Status: AC | PRN
  Administered 2018-06-26: 500 [IU]
  Filled 2018-06-26: qty 5

## 2018-06-26 MED ORDER — MORPHINE SULFATE 15 MG PO TABS: 15.0000 mg | ORAL_TABLET | ORAL | 0 refills | Status: DC | PRN

## 2018-06-26 NOTE — Assessment & Plan Note (Signed)
Her recent MRI is deferred due to profound weakness She is scheduled for repeat MRI next month along with brain tumor follow-up

## 2018-06-26 NOTE — Telephone Encounter (Signed)
[facility] called and left a message. Charna canceled appt today due to MD appt. They will see her Monday per Delrae's request.

## 2018-06-26 NOTE — Assessment & Plan Note (Signed)
She is currently recovering well from side effects of treatment We will continue to hold treatment until improvement of her blood work and her overall performance status I will continue to see her on a weekly basis for further follow-up

## 2018-06-26 NOTE — Assessment & Plan Note (Signed)
Overall, she is eating better She will continue on Remeron She will continue on nutritional supplement as tolerated

## 2018-06-26 NOTE — Assessment & Plan Note (Signed)
She continues to have intermittent bone pain I have refilled her prescription IR morphine to take as needed

## 2018-06-26 NOTE — Assessment & Plan Note (Signed)
She will continue frequent positioning to prevent and promote decubitus ulcer healing She is scheduled for home physical therapy through [facility]

## 2018-06-26 NOTE — Assessment & Plan Note (Signed)
This is multifactorial Overall, it is improving She does not need transfusion support today

## 2018-06-26 NOTE — Assessment & Plan Note (Signed)
Her blood sugar control is satisfactory She will continue insulin therapy as needed

## 2018-06-26 NOTE — Progress Notes (Signed)
Per Dr. Alvy Bimler no transfusion needed.  Patient aware voiced understanding.  Discharged with no distress.

## 2018-06-26 NOTE — Assessment & Plan Note (Signed)
She had recent SVT She will continue amiodarone and beta-blocker as directed by cardiologist

## 2018-06-26 NOTE — Progress Notes (Signed)
[place] OFFICE PROGRESS NOTE  Patient Care Team: Robyne Peers, MD as PCP - General (Family Medicine) Elouise Munroe, MD as PCP - Cardiology (Cardiology)  ASSESSMENT & PLAN:  Metastatic breast cancer [facility]) She is currently recovering well from side effects of treatment We will continue to hold treatment until improvement of her blood work and her overall performance status I will continue to see her on a weekly basis for further follow-up  Malnutrition of moderate degree Overall, she is eating better She will continue on Remeron She will continue on nutritional supplement as tolerated  Metastasis to bone [facility]) She continues to have intermittent bone pain I have refilled her prescription IR morphine to take as needed  Metastasis to brain [facility]) Her recent MRI is deferred due to profound weakness She is scheduled for repeat MRI next month along with brain tumor follow-up  Pancytopenia, acquired ([place]) This is multifactorial Overall, it is improving She does not need transfusion support today  Physical debility She will continue frequent positioning to prevent and promote decubitus ulcer healing She is scheduled for home physical therapy through advanced home care service  Metastasis to lung [facility]) She continues to have persistent cough Her breath sounds are improving She will continue cough drops as needed She will continue oxygen therapy  SVT (supraventricular tachycardia) ([place]) She had recent SVT She will continue amiodarone and beta-blocker as directed by cardiologist  Uncontrolled diabetes mellitus with hyperglycemia ([place]) Her blood sugar control is satisfactory She will continue insulin therapy as needed  Other constipation She has mild intermittent constipation She will continue stool softener and laxatives   No orders of the defined types were placed in this encounter.   INTERVAL HISTORY: Please see below for problem oriented  charting. She is seen today in the infusion room Since last time I saw her, she stated she has not been feeling well Her family has arranged for extra help around the house When she just got discharged, she was predominantly sleeping on the recliner chair 2 days ago, she started sleeping on the bed She continues to have nonproductive cough but denies fever or chills She uses oxygen on a regular basis Her appetite remains poor but she try to eat as much as she can She continues to have significant persistent back pain from decubitus ulcer Physical therapist has been scheduled to come and start home PT next week She continues to require a lot of help mobilizing around the house She has some mild intermittent constipation She did not bring with her blood sugar readings from home but she recall they typically run between 1 50-1 80 She brought with her her home medications She denies chest pain The patient denies any recent signs or symptoms of bleeding such as spontaneous epistaxis, hematuria or hematochezia.   SUMMARY OF ONCOLOGIC HISTORY: Oncology History   Biopsy is ER negative Her2/neu positive     Metastatic breast cancer ([place])   03/11/2018 Imaging    Ct scan of chest, abdomen and pelvis 1. 2.6 x 2.3 cm spiculated mass identified inferomedial quadrant of the left breast. Lesion appears to retract the overlying skin. Mammographic correlation recommended. 2. Small lymph nodes in the left axillar ill-defined and suspicious for metastatic disease. 3. Multiple bilateral pulmonary nodules measuring up to 2.7 cm diameter. These probably represent metastatic disease. Given the dominant size of the central right upper lobe lesion, synchronous lung primary can not be completely excluded. 4. Multiple ill-defined liver lesions compatible with metastatic  disease. Dominant liver metastases measure up to almost 4 cm. 5.  Aortic Atherosclerois (ICD10-170.0)     03/12/2018 Pathology Results     Liver, needle/core biopsy, left lobe - METASTATIC CARCINOMA. - LYMPHOVASCULAR INVASION IS IDENTIFIED. - SEE COMMENT. Microscopic Comment The tumor cells are positive for cytokeratin 7. There is faint staining for GATA-3. There is non-specific staining for TTF-1. Cytokeratin 20, CDX-2, estrogen receptor, and GCDFP stains are negative. Given the clinical suspicion, the profile supports a primary breast carcinoma. Her2 will be performed and the results reported separately.  By immunohistochemistry, the tumor cells are POSITIVE for Her2 (3+).    03/12/2018 Imaging    Single 1.3 x 1.4 cm RIGHT occipital metastasis.  Borderline pachymeningeal enhancement of symmetric nature could be related to the superficial metastasis or osseous disease.    03/12/2018 Procedure    Ultrasound-guided core biopsy performed of a mass within the lateral segment of the left lobe of the liver.    03/16/2018 Imaging    Right occipital lobe 13 x 16 mm. Small satellite enhancing nodule deep to the larger lesion. The larger lesion shows central necrosis and probable mild hemorrhage or calcification.  Mild dural enhancement is less impressive and could be within normal limits.  Bone marrow in the clivus and cervical spine is diffusely low signal. No focal lesion. This may be related to the patient's anemia and abnormal blood count.    03/17/2018 Cancer Staging    Staging form: Breast, AJCC 8th Edition - Clinical: Stage IV (cT2, cN0, pM1, GX, ER-, PR: Not Assessed, HER2+) - Signed by [name], MD on 03/17/2018    03/19/2018 -  Chemotherapy    She received chemo with Taxotere, Herceptin and Perjeta    03/21/2018 Echocardiogram    IMPRESSIONS 1. The left ventricle has normal systolic function with an ejection fraction of 60-65%. The cavity size was normal. Left ventricular diastolic Doppler parameters are consistent with impaired relaxation.  2. The right ventricle has normal systolic function. The cavity was normal.  There is no increase in right ventricular wall thickness.  3. The mitral valve is normal in structure.  4. The tricuspid valve is normal in structure.  5. Strain imaging performed but not reported due to interpreter judgement, secondary to suboptimal image quality.    03/24/2018 Procedure    Successful placement of a right IJ approach Power Port with ultrasound and fluoroscopic guidance. The catheter is ready for use.    04/07/2018 - 04/16/2018 Hospital Admission    She was admitted for pneumonia    04/07/2018 - 04/16/2018 Hospital Admission    She was admitted to the hospital for pneumonia and respiratory failure    04/25/2018 Imaging    Retropharyngeal fluid collection compatible with effusion or possibly abscess. Soft tissue thickening extends into the right neck and surrounds the right carotid bifurcation and right internal carotid artery. There is also streaky density in the right lateral neck soft tissues with a focal 7.6 Mm lymph node in the right lateral neck. Stranding surrounds the right submandibular gland.   Findings are most compatible with infection. Favor pharyngitis with retropharyngeal effusion/abscess. Soft tissue thickening around the right carotid most compatible with infection rather than tumor. Right jugular vein is patent.  Multiple nodular densities in the right upper lobe may represent residual pneumonia or metastatic disease. Interval improvement in right upper lobe infiltrate compared with CT of 04/08/2018    04/25/2018 - 05/06/2018 Hospital Admission    She was admitted for retropharyngeal abscess  04/27/2018 Imaging    No evidence of large central pulmonary embolus is noted in the main pulmonary artery or the main portions of the right and left pulmonary arteries. However, evaluation of the lower lobe branches, particularly on the right, is limited due to respiratory motion artifact and other limiting issues. Pulmonary emboli and smaller peripheral branches can not be  excluded on the basis of this exam.  Large left pleural effusion is noted with complete atelectasis of the left lower lobe. Increased left upper lobe opacity is noted posteriorly concerning for atelectasis or pneumonia.  Improved right upper lobe and lower lobe opacities are noted suggesting improving pneumonia or atelectasis.  15 mm right paratracheal lymph node is noted which is enlarged compared to prior exam; it is uncertain if this is metastatic or inflammatory in etiology.  Hepatic metastatic lesions are again noted.  Aortic Atherosclerosis (ICD10-I70.0).     04/27/2018 Procedure    Successful ultrasound guided left thoracentesis yielding 120 mL of blood-tinged of pleural fluid.    06/11/2018 Imaging    1. No evidence of pulmonary emboli. 2. Extensive bilateral lower lobe consolidation. 3. Small pleural effusions. 4. 2.5 cm right upper lobe nodule, enlarged from 04/27/2018 and which may reflect a metastasis or primary lung cancer. Stable to mild enlargement of subcentimeter nodules in both upper lobes consistent with known metastases. 5. Decreased size of liver metastases. 6. Unchanged mild mediastinal and right hilar lymphadenopathy. 7. Aortic Atherosclerosis (ICD10-I70.0).    06/11/2018 - 06/22/2018 Hospital Admission    She was hospitalized for infection and SVT     Metastasis to brain [facility])   03/17/2018 Initial Diagnosis    Metastasis to brain [facility])     Metastasis to liver ([place])   03/17/2018 Initial Diagnosis    Metastasis to liver [facility])     Metastasis to bone ([place])   03/17/2018 Initial Diagnosis    Metastasis to bone ([facility])     REVIEW OF SYSTEMS:   Constitutional: Denies fevers, chills or abnormal weight loss Eyes: Denies blurriness of vision Ears, nose, mouth, throat, and face: Denies mucositis or sore throat Cardiovascular: Denies palpitation, chest discomfort or lower extremity swelling Skin: Denies abnormal skin rashes Lymphatics: Denies new  lymphadenopathy  Behavioral/Psych: Mood is stable, no new changes  All other systems were reviewed with the patient and are negative.  I have reviewed the past medical history, past surgical history, social history and family history with the patient and they are unchanged from previous note.  ALLERGIES:  is allergic to nsaids.  MEDICATIONS:  Current Outpatient Medications  Medication Sig Dispense Refill  . ACCU-CHEK AVIVA PLUS test strip     . ACCU-CHEK SOFTCLIX LANCETS lancets     . acetaminophen (TYLENOL) 325 MG tablet Take 2 tablets (650 mg total) by mouth every 6 (six) hours as needed for mild pain (or Fever >/= 101). 30 tablet 0  . albuterol (PROAIR HFA) 108 (90 Base) MCG/ACT inhaler Inhale 2 puffs into the lungs every 6 (six) hours as needed for wheezing or shortness of breath. 1 Inhaler 1  . amiodarone (PACERONE) 200 MG tablet Take 1 tablet (200 mg total) by mouth daily. 30 tablet 0  . benzonatate (TESSALON) 200 MG capsule Take 1 capsule (200 mg total) by mouth 3 (three) times daily as needed for cough (use second). 20 capsule 0  . blood glucose meter kit and supplies KIT Dispense based on patient and insurance preference. Use up to four times daily as directed. (FOR ICD-9 250.00, 250.01).  1 each 0  . esomeprazole (NEXIUM) 40 MG capsule Take 1 capsule (40 mg total) by mouth 2 (two) times daily before a meal. 180 capsule 1  . feeding supplement, ENSURE ENLIVE, (ENSURE ENLIVE) LIQD Take 237 mLs by mouth 3 (three) times daily between meals. 237 mL 12  . fluticasone (FLOVENT HFA) 44 MCG/ACT inhaler Inhale 2 puffs into the lungs 2 (two) times daily. 3 Inhaler 1  . insulin aspart (NOVOLOG) 100 UNIT/ML FlexPen Inject 8 Units into the skin 3 (three) times daily with meals. (Patient taking differently: Inject 8 Units into the skin 3 (three) times daily with meals. Holds dose if not going to eat.) 15 mL 11  . Insulin Detemir (LEVEMIR) 100 UNIT/ML Pen Inject 10 Units into the skin daily. (Patient  taking differently: Inject 10 Units into the skin at bedtime. ) 15 mL 0  . Insulin Pen Needle 31G X 5 MM MISC 1 Device by Does not apply route QID. For use with insulin pens 100 each 0  . lidocaine-prilocaine (EMLA) cream Apply to affected area once (Patient taking differently: Apply 1 application topically as needed (port access). Apply to affected area once) 30 g 3  . LORazepam (ATIVAN) 0.5 MG tablet Take 1 tab po 30 minutes prior to radiation or MRI (Patient taking differently: Take 0.5 mg by mouth See admin instructions. Take 30 minutes prior to radiation or MRI) 30 tablet 0  . metFORMIN (GLUCOPHAGE) 500 MG tablet Take 500 mg by mouth daily with breakfast.     . metoprolol tartrate (LOPRESSOR) 25 MG tablet Take 0.5 tablets (12.5 mg total) by mouth 2 (two) times daily. 60 tablet 0  . mirtazapine (REMERON) 15 MG tablet Take 1 tablet (15 mg total) by mouth at bedtime. 30 tablet 0  . montelukast (SINGULAIR) 10 MG tablet Take 1 tablet (10 mg total) by mouth daily. 30 tablet 1  . morphine (MSIR) 15 MG tablet Take 1 tablet (15 mg total) by mouth every 4 (four) hours as needed for severe pain. 60 tablet 0  . Multiple Vitamin (MULTIVITAMIN WITH MINERALS) TABS tablet Take 1 tablet by mouth daily.    . ondansetron (ZOFRAN) 8 MG tablet Take 8 mg by mouth every 8 (eight) hours as needed for nausea or vomiting.     . prochlorperazine (COMPAZINE) 10 MG tablet Take 10 mg by mouth every 6 (six) hours as needed for nausea or vomiting.      No current facility-administered medications for this visit.    Facility-Administered Medications Ordered in Other Visits  Medication Dose Route Frequency Provider Last Rate Last Dose  . heparin lock flush 100 unit/mL  250 Units Intracatheter PRN Mischa Brittingham, MD      . sodium chloride flush (NS) 0.9 % injection 3 mL  3 mL Intracatheter PRN Leith Szafranski, MD        PHYSICAL EXAMINATION: ECOG PERFORMANCE STATUS: 3 - Symptomatic, >50% confined to bed GENERAL:alert, no distress  and comfortable.  She has oxygen delivered via nasal cannula SKIN: Noted skin bruises on her left upper arm.  She looks a bit pale.  Her decubitus ulcer on the right heel is healing well EYES: normal, Conjunctiva are pink and non-injected, sclera clear OROPHARYNX:no exudate, no erythema and lips, buccal mucosa, and tongue normal  NECK: supple, thyroid normal size, non-tender, without nodularity LYMPH:  no palpable lymphadenopathy in the cervical, axillary or inguinal LUNGS: She has mild increased breathing effort.  Crackles on both lung bases but no wheezes  hEART:  She has tachycardia, and no murmurs and no lower extremity edema ABDOMEN:abdomen soft, non-tender and normal bowel sounds Musculoskeletal:no cyanosis of digits and no clubbing  NEURO: alert & oriented x 3 with fluent speech, no focal motor/sensory deficits  LABORATORY DATA:  I have reviewed the data as listed    Component Value Date/Time   NA 133 (L) 06/26/2018 0757   K 4.2 06/26/2018 0757   CL 94 (L) 06/26/2018 0757   CO2 30 06/26/2018 0757   GLUCOSE 122 (H) 06/26/2018 0757   BUN 17 06/26/2018 0757   CREATININE 0.68 06/26/2018 0757   CALCIUM 9.0 06/26/2018 0757   PROT 7.9 06/26/2018 0757   ALBUMIN 1.9 (L) 06/26/2018 0757   AST 29 06/26/2018 0757   ALT 26 06/26/2018 0757   ALKPHOS 434 (H) 06/26/2018 0757   BILITOT 0.8 06/26/2018 0757   GFRNONAA >60 06/26/2018 0757   GFRAA >60 06/26/2018 0757    No results found for: SPEP, UPEP  Lab Results  Component Value Date   WBC 7.5 06/26/2018   NEUTROABS 4.0 06/26/2018   HGB 8.5 (L) 06/26/2018   HCT 27.2 (L) 06/26/2018   MCV 91.3 06/26/2018   PLT 27 (L) 06/26/2018      Chemistry      Component Value Date/Time   NA 133 (L) 06/26/2018 0757   K 4.2 06/26/2018 0757   CL 94 (L) 06/26/2018 0757   CO2 30 06/26/2018 0757   BUN 17 06/26/2018 0757   CREATININE 0.68 06/26/2018 0757      Component Value Date/Time   CALCIUM 9.0 06/26/2018 0757   ALKPHOS 434 (H) 06/26/2018  0757   AST 29 06/26/2018 0757   ALT 26 06/26/2018 0757   BILITOT 0.8 06/26/2018 0757       RADIOGRAPHIC STUDIES: I have personally reviewed the radiological images as listed and agreed with the findings in the report. Ct Angio Chest Pe W Or Wo Contrast  Result Date: 06/11/2018 CLINICAL DATA:  Shortness of breath. Metastatic breast cancer. EXAM: CT ANGIOGRAPHY CHEST WITH CONTRAST TECHNIQUE: Multidetector CT imaging of the chest was performed using the standard protocol during bolus administration of intravenous contrast. Multiplanar CT image reconstructions and MIPs were obtained to evaluate the vascular anatomy. CONTRAST:  66m OMNIPAQUE IOHEXOL 350 MG/ML SOLN COMPARISON:  Chest radiograph 06/11/2018. Thoracic spine CT 05/19/2018. Chest CTA 04/27/2018. FINDINGS: Cardiovascular: Pulmonary arterial opacification is adequate without evidence of lobar more proximal emboli. No segmental emboli are identified although assessment is limited in some areas due to motion artifact. Mild thoracic aortic atherosclerosis is noted without aneurysm. The heart is enlarged. There is no pericardial effusion. A right jugular Port-A-Cath terminates in the high right atrium. Mediastinum/Nodes: Calcified subcarinal and right hilar lymph nodes are again noted. Mildly enlarged noncalcified lymph nodes including a 10 mm short axis right paratracheal lymph node, levin mm precarinal lymph node, an 11 mm right hilar lymph node are unchanged from the prior chest CTA. The esophagus and thyroid are unremarkable. Lungs/Pleura: There are small bilateral pleural effusions, unchanged on the right and decreased in size on the left compared to the prior chest CTA. Extensive right lower lobe consolidation has greatly progressed from the prior chest CTA with progression also noted compared to the more recent thoracic spine CT. There is mildly improved aeration of the left lower lobe compared to the prior chest CTA, however moderately extensive  consolidation and volume loss remain. A 2.5 x 2.2 cm right upper lobe nodule is unchanged from the thoracic spine CT  though has enlarged from the chest CTA (series 10, image 51), as has an 8 mm nodule more anterolaterally in the right upper lobe (series 10, image 50). Multiple subcentimeter nodules are present in the left upper lobe with some being larger compared to the prior chest CT and others being unchanged. Upper Abdomen: Liver lesions have decreased in size, for example a segment VII/VIII lesion now measures 16 mm (series 4, image 85, previously 23 mm). Calcified granulomas are noted in the spleen. Musculoskeletal: Unchanged subcentimeter sclerotic foci in the T11 and T12 vertebral bodies, left glenoid, and inferior right scapular body. Review of the MIP images confirms the above findings. IMPRESSION: 1. No evidence of pulmonary emboli. 2. Extensive bilateral lower lobe consolidation. 3. Small pleural effusions. 4. 2.5 cm right upper lobe nodule, enlarged from 04/27/2018 and which may reflect a metastasis or primary lung cancer. Stable to mild enlargement of subcentimeter nodules in both upper lobes consistent with known metastases. 5. Decreased size of liver metastases. 6. Unchanged mild mediastinal and right hilar lymphadenopathy. 7. Aortic Atherosclerosis (ICD10-I70.0). Electronically Signed   By: Logan Bores M.D.   On: 06/11/2018 17:02   Dg Chest Port 1 View  Result Date: 06/22/2018 CLINICAL DATA:  Shortness of breath EXAM: PORTABLE CHEST 1 VIEW COMPARISON:  Jun 21, 2018 FINDINGS: Port-A-Cath tip is in the superior vena cava of the cavoatrial junction. No pneumothorax. Right pleural effusion which may be partially loculated persists. There is patchy bibasilar atelectasis. There may be a degree of superimposed consolidation in the right base. Heart is mildly enlarged with pulmonary vascularity normal. No adenopathy. There is aortic atherosclerosis. No bone lesions. IMPRESSION: Right pleural effusion  with atelectasis and probable superimposed consolidation in the right base region. Patchy atelectasis in the left base region is stable. No new opacity is evident compared to 1 day prior. Stable cardiac prominence. Aortic Atherosclerosis (ICD10-I70.0). Electronically Signed   By: Lowella Grip III M.D.   On: 06/22/2018 08:00   Dg Chest Port 1 View  Result Date: 06/21/2018 CLINICAL DATA:  58 year old female with history of shortness of breath. EXAM: PORTABLE CHEST 1 VIEW COMPARISON:  Chest x-ray 06/20/2018. FINDINGS: Right internal jugular single-lumen porta cath with tip terminating in the distal superior vena cava. Lung volumes are low. Moderate right pleural effusion which appears partially loculated, with fluid extending into the apex of the right hemithorax. Small left pleural effusion. Bibasilar opacities (right greater than left), which may reflect areas of atelectasis and/or consolidation. No evidence of pulmonary edema. Heart size is borderline enlarged. Upper mediastinal contours are within normal limits. IMPRESSION: 1. Support apparatus, as above. 2. Moderate partially loculated right pleural effusion. Small left pleural effusion. These are associated with bibasilar (right greater than left) areas of atelectasis and/or consolidation in the lung bases bilaterally. Electronically Signed   By: Vinnie Langton M.D.   On: 06/21/2018 07:26   Dg Chest Port 1 View  Result Date: 06/20/2018 CLINICAL DATA:  Shortness of breath. EXAM: PORTABLE CHEST 1 VIEW COMPARISON:  Radiograph Jun 19, 2018. FINDINGS: Stable cardiomegaly. No pneumothorax is noted. Right internal jugular Port-A-Cath is unchanged in position. Stable bilateral lung opacities are noted concerning for edema or atelectasis with associated pleural effusions, right greater than left. Bony thorax is unremarkable. IMPRESSION: Stable bilateral lung opacities concerning for edema or atelectasis with associated pleural effusions. Electronically  Signed   By: Marijo Conception M.D.   On: 06/20/2018 08:22   Dg Chest Port 1 View  Result Date: 06/19/2018 CLINICAL  DATA:  History of metastatic breast cancer. EXAM: PORTABLE CHEST 1 VIEW COMPARISON:  Jun 17, 2018 FINDINGS: Right Port-A-Cath is stable. Bilateral pleural effusions, right greater than left with underlying atelectasis/consolidation. Cardiomediastinal silhouette is stable. IMPRESSION: No interval change in bilateral pleural effusions with underlying atelectasis/consolidation. Stable right Port-A-Cath. Electronically Signed   By: Dorise Bullion III M.D   On: 06/19/2018 10:10   Dg Chest Port 1 View  Result Date: 06/17/2018 CLINICAL DATA:  58 year old female with respiratory distress EXAM: PORTABLE CHEST 1 VIEW COMPARISON:  06/16/2018, 06/12/2018, CT 06/11/2018 FINDINGS: Cardiomediastinal silhouette likely unchanged in size and contour. Heart borders partially obscured by overlying lung and pleural disease. Opacities at the bilateral lung bases obscuring the hemidiaphragms and heart borders, appears unchanged from prior. Graded opacities towards the bases. Right hilar opacity persists. Partially calcified nodes at the right hilum. No pneumothorax. Right IJ port catheter. No displaced fracture IMPRESSION: Similar appearance of the chest x-ray, with bilateral right greater than left pleural effusions and associated atelectasis/consolidation. Right hilar opacities better seen on recent CT imaging. Unchanged right IJ port catheter. Electronically Signed   By: Corrie Mckusick D.O.   On: 06/17/2018 08:05   Dg Chest Port 1 View  Result Date: 06/16/2018 CLINICAL DATA:  Metastatic breast cancer pneumonia EXAM: PORTABLE CHEST 1 VIEW COMPARISON:  06/12/2018 FINDINGS: Right-sided Port-A-Cath with the tip projecting over the SVC. Small right pleural effusion. Bilateral lower lobe airspace disease most concerning for pneumonia. Persistent right upper lobe pulmonary nodule consistent malignancy. No pneumothorax.  Stable cardiomediastinal silhouette. No aggressive osseous lesion. IMPRESSION: 1. Bilateral lower lobe airspace disease most consistent with pneumonia. Small right pleural effusion. 2. Persistent right upper lobe pulmonary nodule consistent with malignancy. Electronically Signed   By: Kathreen Devoid   On: 06/16/2018 08:49   Dg Chest Port 1 View  Result Date: 06/12/2018 CLINICAL DATA:  58 year old female with possible pneumonia in cancer patient. EXAM: PORTABLE CHEST 1 VIEW COMPARISON:  06/11/2018 and earlier. FINDINGS: Portable AP semi upright view at 0943 hours. Stable right chest power port, accessed. Increasing confluence of mid and lower lung opacity. Consolidation demonstrated by CT yesterday. Trace pleural fluid more apparent by CT. No superimposed pneumothorax or pulmonary edema. Stable cardiac size and mediastinal contours. Visualized tracheal air column is within normal limits. IMPRESSION: Mild radiographic progression of bilateral consolidation/pneumonia since yesterday. Electronically Signed   By: Genevie Ann M.D.   On: 06/12/2018 10:37   Dg Chest Port 1 View  Result Date: 06/11/2018 CLINICAL DATA:  Hypotension. EXAM: PORTABLE CHEST 1 VIEW COMPARISON:  Radiograph of May 19, 2018. FINDINGS: Stable cardiomediastinal silhouette. Right internal jugular Port-A-Cath is unchanged in position. No pneumothorax is noted. Increased bibasilar opacities are noted concerning for pneumonia or edema. Small pleural effusions may be present. Bony thorax is unremarkable. IMPRESSION: Increased bibasilar opacities are noted concerning for worsening pneumonia or edema. Small pleural effusions may be present. Electronically Signed   By: Marijo Conception M.D.   On: 06/11/2018 11:43    All questions were answered. The patient knows to call the clinic with any problems, questions or concerns. No barriers to learning was detected.  I spent 30 minutes counseling the patient face to face. The total time spent in the  appointment was 40 minutes and more than 50% was on counseling and review of test results  [name], MD 06/26/2018 1:03 PM

## 2018-06-26 NOTE — Assessment & Plan Note (Signed)
She continues to have persistent cough Her breath sounds are improving She will continue cough drops as needed She will continue oxygen therapy

## 2018-06-26 NOTE — Assessment & Plan Note (Signed)
She has mild intermittent constipation She will continue stool softener and laxatives

## 2018-06-27 ENCOUNTER — Other Ambulatory Visit: Payer: BLUE CROSS/BLUE SHIELD

## 2018-06-30 ENCOUNTER — Telehealth: Payer: Self-pay

## 2018-06-30 ENCOUNTER — Ambulatory Visit: Payer: Self-pay | Admitting: Radiation Oncology

## 2018-06-30 ENCOUNTER — Telehealth: Payer: Self-pay | Admitting: Hematology and Oncology

## 2018-06-30 NOTE — Telephone Encounter (Signed)
-----   Message from [name], MD sent at 06/30/2018  8:15 AM EDT ----- Regarding: how is she doing? Wait for appt before you call; how is she doing? Last week, I think Hassan Rowan sent scheduling for 4 hours Rx in the bed on Friday for her If not appointment by lunch please call scehduler

## 2018-06-30 NOTE — Telephone Encounter (Signed)
Called regarding 6/5  °

## 2018-07-01 ENCOUNTER — Other Ambulatory Visit: Payer: BLUE CROSS/BLUE SHIELD

## 2018-07-02 ENCOUNTER — Telehealth: Payer: Self-pay | Admitting: *Deleted

## 2018-07-02 NOTE — Telephone Encounter (Signed)
-----   Message from [name], MD sent at 07/01/2018  5:08 PM EDT ----- Regarding: RE: anti anxiety med She was prescribe Remeron recently. I can try to increase the dose to 30 mg when I see her on Friday. Also, I am planning to discuss with her on Friday about accepting counseling from [place] the patient she needs to participate with therapy to get stronger.  ----- Message ----- From: Paulla Dolly, RN Sent: 07/01/2018   4:36 PM EDT To: Belva Chimes, RN, [name], MD Subject: anti anxiety med                               Morey Hummingbird from [facility] called.   She said pt was very anxious on her home visit today.   Pt is afraid to stand up because she is afraid of falling.   Morey Hummingbird asked if she takes anything for axiety. Pt said she has xanax. Morey Hummingbird did not want to suggest to her to take that d/t risk of sedation, fall. Morey Hummingbird wants to know if you would prescribe pt something for anxiety.

## 2018-07-02 NOTE — Telephone Encounter (Signed)
Spoke to patient about participating with therapy. She reports she has a fear of falling but this seems like it was blown out of proportion. She does not have a debilitating fear of falling. She has been participating in therapy just fine. She does not think there is a need for any intervention. She will discuss this further with Dr. Alvy Bimler at her next appt.

## 2018-07-04 ENCOUNTER — Other Ambulatory Visit: Payer: BLUE CROSS/BLUE SHIELD

## 2018-07-04 ENCOUNTER — Encounter: Payer: Self-pay | Admitting: Hematology and Oncology

## 2018-07-04 ENCOUNTER — Encounter: Payer: Self-pay | Admitting: General Practice

## 2018-07-04 ENCOUNTER — Other Ambulatory Visit: Payer: Self-pay

## 2018-07-04 ENCOUNTER — Inpatient Hospital Stay: Payer: BC Managed Care – PPO

## 2018-07-04 ENCOUNTER — Inpatient Hospital Stay: Payer: BC Managed Care – PPO | Attending: Hematology and Oncology

## 2018-07-04 ENCOUNTER — Inpatient Hospital Stay (HOSPITAL_BASED_OUTPATIENT_CLINIC_OR_DEPARTMENT_OTHER): Payer: BC Managed Care – PPO | Admitting: Hematology and Oncology

## 2018-07-04 ENCOUNTER — Other Ambulatory Visit: Payer: Self-pay | Admitting: Hematology and Oncology

## 2018-07-04 VITALS — BP 115/68 | HR 101 | Temp 97.7°F | Resp 18

## 2018-07-04 DIAGNOSIS — D6481 Anemia due to antineoplastic chemotherapy: Secondary | ICD-10-CM | POA: Insufficient documentation

## 2018-07-04 DIAGNOSIS — K219 Gastro-esophageal reflux disease without esophagitis: Secondary | ICD-10-CM | POA: Insufficient documentation

## 2018-07-04 DIAGNOSIS — Z7951 Long term (current) use of inhaled steroids: Secondary | ICD-10-CM | POA: Insufficient documentation

## 2018-07-04 DIAGNOSIS — D61818 Other pancytopenia: Secondary | ICD-10-CM | POA: Insufficient documentation

## 2018-07-04 DIAGNOSIS — I10 Essential (primary) hypertension: Secondary | ICD-10-CM | POA: Diagnosis not present

## 2018-07-04 DIAGNOSIS — E440 Moderate protein-calorie malnutrition: Secondary | ICD-10-CM | POA: Insufficient documentation

## 2018-07-04 DIAGNOSIS — E1165 Type 2 diabetes mellitus with hyperglycemia: Secondary | ICD-10-CM | POA: Insufficient documentation

## 2018-07-04 DIAGNOSIS — C7800 Secondary malignant neoplasm of unspecified lung: Secondary | ICD-10-CM

## 2018-07-04 DIAGNOSIS — Z9221 Personal history of antineoplastic chemotherapy: Secondary | ICD-10-CM | POA: Diagnosis not present

## 2018-07-04 DIAGNOSIS — Z794 Long term (current) use of insulin: Secondary | ICD-10-CM | POA: Diagnosis not present

## 2018-07-04 DIAGNOSIS — C787 Secondary malignant neoplasm of liver and intrahepatic bile duct: Secondary | ICD-10-CM | POA: Insufficient documentation

## 2018-07-04 DIAGNOSIS — C7801 Secondary malignant neoplasm of right lung: Secondary | ICD-10-CM

## 2018-07-04 DIAGNOSIS — C7931 Secondary malignant neoplasm of brain: Secondary | ICD-10-CM

## 2018-07-04 DIAGNOSIS — C50919 Malignant neoplasm of unspecified site of unspecified female breast: Secondary | ICD-10-CM

## 2018-07-04 DIAGNOSIS — C50312 Malignant neoplasm of lower-inner quadrant of left female breast: Secondary | ICD-10-CM | POA: Diagnosis present

## 2018-07-04 DIAGNOSIS — Z5112 Encounter for antineoplastic immunotherapy: Secondary | ICD-10-CM | POA: Diagnosis not present

## 2018-07-04 DIAGNOSIS — Z171 Estrogen receptor negative status [ER-]: Secondary | ICD-10-CM

## 2018-07-04 DIAGNOSIS — Z5111 Encounter for antineoplastic chemotherapy: Secondary | ICD-10-CM | POA: Insufficient documentation

## 2018-07-04 DIAGNOSIS — Z79899 Other long term (current) drug therapy: Secondary | ICD-10-CM | POA: Insufficient documentation

## 2018-07-04 DIAGNOSIS — M7989 Other specified soft tissue disorders: Secondary | ICD-10-CM | POA: Insufficient documentation

## 2018-07-04 DIAGNOSIS — C7951 Secondary malignant neoplasm of bone: Secondary | ICD-10-CM | POA: Diagnosis not present

## 2018-07-04 DIAGNOSIS — Z7984 Long term (current) use of oral hypoglycemic drugs: Secondary | ICD-10-CM | POA: Diagnosis not present

## 2018-07-04 DIAGNOSIS — R5381 Other malaise: Secondary | ICD-10-CM | POA: Diagnosis not present

## 2018-07-04 DIAGNOSIS — M7022 Olecranon bursitis, left elbow: Secondary | ICD-10-CM | POA: Insufficient documentation

## 2018-07-04 DIAGNOSIS — M79622 Pain in left upper arm: Secondary | ICD-10-CM | POA: Insufficient documentation

## 2018-07-04 DIAGNOSIS — I700 Atherosclerosis of aorta: Secondary | ICD-10-CM | POA: Insufficient documentation

## 2018-07-04 DIAGNOSIS — E119 Type 2 diabetes mellitus without complications: Secondary | ICD-10-CM

## 2018-07-04 DIAGNOSIS — T451X5A Adverse effect of antineoplastic and immunosuppressive drugs, initial encounter: Secondary | ICD-10-CM

## 2018-07-04 LAB — CBC WITH DIFFERENTIAL (CANCER CENTER ONLY)
Abs Immature Granulocytes: 0.65 10*3/uL — ABNORMAL HIGH (ref 0.00–0.07)
Basophils Absolute: 0.4 10*3/uL — ABNORMAL HIGH (ref 0.0–0.1)
Basophils Relative: 6 %
Eosinophils Absolute: 0 10*3/uL (ref 0.0–0.5)
Eosinophils Relative: 0 %
HCT: 18.2 % — ABNORMAL LOW (ref 36.0–46.0)
Hemoglobin: 5.5 g/dL — CL (ref 12.0–15.0)
Immature Granulocytes: 11 %
Lymphocytes Relative: 23 %
Lymphs Abs: 1.4 10*3/uL (ref 0.7–4.0)
MCH: 27.6 pg (ref 26.0–34.0)
MCHC: 30.2 g/dL (ref 30.0–36.0)
MCV: 91.5 fL (ref 80.0–100.0)
Monocytes Absolute: 0.8 10*3/uL (ref 0.1–1.0)
Monocytes Relative: 13 %
Neutro Abs: 2.9 10*3/uL (ref 1.7–7.7)
Neutrophils Relative %: 47 %
Platelet Count: 34 10*3/uL — ABNORMAL LOW (ref 150–400)
RBC: 1.99 MIL/uL — ABNORMAL LOW (ref 3.87–5.11)
RDW: 16.1 % — ABNORMAL HIGH (ref 11.5–15.5)
WBC Count: 6.1 10*3/uL (ref 4.0–10.5)
nRBC: 0 % (ref 0.0–0.2)

## 2018-07-04 LAB — SAMPLE TO BLOOD BANK

## 2018-07-04 LAB — CMP (CANCER CENTER ONLY)
ALT: 12 U/L (ref 0–44)
AST: 18 U/L (ref 15–41)
Albumin: 1.8 g/dL — ABNORMAL LOW (ref 3.5–5.0)
Alkaline Phosphatase: 217 U/L — ABNORMAL HIGH (ref 38–126)
Anion gap: 11 (ref 5–15)
BUN: 21 mg/dL — ABNORMAL HIGH (ref 6–20)
CO2: 30 mmol/L (ref 22–32)
Calcium: 8.7 mg/dL — ABNORMAL LOW (ref 8.9–10.3)
Chloride: 94 mmol/L — ABNORMAL LOW (ref 98–111)
Creatinine: 0.72 mg/dL (ref 0.44–1.00)
GFR, Est AFR Am: >60 mL/min (ref >60)
GFR, Estimated: >60 mL/min (ref >60)
Glucose, Bld: 243 mg/dL — ABNORMAL HIGH (ref 70–99)
Potassium: 4 mmol/L (ref 3.5–5.1)
Sodium: 135 mmol/L (ref 135–145)
Total Bilirubin: 0.5 mg/dL (ref 0.3–1.2)
Total Protein: 7.7 g/dL (ref 6.5–8.1)

## 2018-07-04 LAB — PREPARE RBC (CROSSMATCH)

## 2018-07-04 LAB — HEMOGLOBIN AND HEMATOCRIT (CANCER CENTER ONLY)
HCT: 21.2 % — ABNORMAL LOW (ref 36.0–46.0)
Hemoglobin: 6.6 g/dL — CL (ref 12.0–15.0)

## 2018-07-04 MED ORDER — DIPHENHYDRAMINE HCL 25 MG PO CAPS
25.0000 mg | ORAL_CAPSULE | Freq: Once | ORAL | Status: AC
  Administered 2018-07-04: 25 mg via ORAL

## 2018-07-04 MED ORDER — DIPHENHYDRAMINE HCL 25 MG PO CAPS
ORAL_CAPSULE | ORAL | Status: AC
  Filled 2018-07-04: qty 1

## 2018-07-04 MED ORDER — SODIUM CHLORIDE 0.9% FLUSH
10.0000 mL | INTRAVENOUS | Status: AC | PRN
  Administered 2018-07-04: 10 mL
  Filled 2018-07-04: qty 10

## 2018-07-04 MED ORDER — SODIUM CHLORIDE 0.9% IV SOLUTION
250.0000 mL | Freq: Once | INTRAVENOUS | Status: AC
  Administered 2018-07-04: 10:00:00 250 mL via INTRAVENOUS
  Filled 2018-07-04: qty 250

## 2018-07-04 MED ORDER — HEPARIN SOD (PORK) LOCK FLUSH 100 UNIT/ML IV SOLN
500.0000 [IU] | Freq: Every day | INTRAVENOUS | Status: AC | PRN
  Administered 2018-07-04: 500 [IU]
  Filled 2018-07-04: qty 5

## 2018-07-04 MED ORDER — SODIUM CHLORIDE 0.9% IV SOLUTION
250.0000 mL | Freq: Once | INTRAVENOUS | Status: AC
  Administered 2018-07-04: 250 mL via INTRAVENOUS
  Filled 2018-07-04: qty 250

## 2018-07-04 MED ORDER — ACETAMINOPHEN 325 MG PO TABS
650.0000 mg | ORAL_TABLET | Freq: Once | ORAL | Status: AC
  Administered 2018-07-04: 650 mg via ORAL

## 2018-07-04 MED ORDER — ACETAMINOPHEN 325 MG PO TABS
ORAL_TABLET | ORAL | Status: AC
  Filled 2018-07-04: qty 2

## 2018-07-04 NOTE — Patient Instructions (Signed)
Blood Transfusion, Adult, Care After This sheet gives you information about how to care for yourself after your procedure. Your doctor may also give you more specific instructions. If you have problems or questions, contact your doctor. Follow these instructions at home:   Take over-the-counter and prescription medicines only as told by your doctor.  Go back to your normal activities as told by your doctor.  Follow instructions from your doctor about how to take care of the area where an IV tube was put into your vein (insertion site). Make sure you: ? Wash your hands with soap and water before you change your bandage (dressing). If there is no soap and water, use hand sanitizer. ? Change your bandage as told by your doctor.  Check your IV insertion site every day for signs of infection. Check for: ? More redness, swelling, or pain. ? More fluid or blood. ? Warmth. ? Pus or a bad smell. Contact a doctor if:  You have more redness, swelling, or pain around the IV insertion site.  You have more fluid or blood coming from the IV insertion site.  Your IV insertion site feels warm to the touch.  You have pus or a bad smell coming from the IV insertion site.  Your pee (urine) turns pink, red, or brown.  You feel weak after doing your normal activities. Get help right away if:  You have signs of a serious allergic or body defense (immune) system reaction, including: ? Itchiness. ? Hives. ? Trouble breathing. ? Anxiety. ? Pain in your chest or lower back. ? Fever, flushing, and chills. ? Fast pulse. ? Rash. ? Watery poop (diarrhea). ? Throwing up (vomiting). ? Dark pee. ? Serious headache. ? Dizziness. ? Stiff neck. ? Yellow color in your face or the white parts of your eyes (jaundice). Summary  After a blood transfusion, return to your normal activities as told by your doctor.  Every day, check for signs of infection where the IV tube was put into your vein.  Some  signs of infection are warm skin, more redness and pain, more fluid or blood, and pus or a bad smell where the needle went in.  Contact your doctor if you feel weak or have any unusual symptoms. This information is not intended to replace advice given to you by your health care provider. Make sure you discuss any questions you have with your health care provider. Document Released: 02/05/2014 Document Revised: 09/09/2015 Document Reviewed: 09/09/2015 Elsevier Interactive Patient Education  2019 [employer]  

## 2018-07-04 NOTE — Assessment & Plan Note (Addendum)
This is multifactorial She has improvement of blood count although she is still transfusion dependent We discussed some of the risks, benefits, and alternatives of blood transfusions. The patient is symptomatic from anemia and the hemoglobin level is critically low.  Some of the side-effects to be expected including risks of transfusion reactions, chills, infection, syndrome of volume overload and risk of hospitalization from various reasons and the patient is willing to proceed and went ahead to sign consent today. She will get blood to keep hemoglobin greater than 7 She does not need platelet transfusion or G-CSF

## 2018-07-04 NOTE — Assessment & Plan Note (Signed)
Overall, she is eating better She will continue on Remeron She will continue on nutritional supplement as tolerated

## 2018-07-04 NOTE — Assessment & Plan Note (Signed)
She is currently recovering well from side effects of treatment We will continue to hold treatment until improvement of her blood work and her overall performance status I will continue to see her on a weekly basis for further follow-up

## 2018-07-04 NOTE — Assessment & Plan Note (Addendum)
Her recent CT showed resolution of liver metastasis Her liver function is back to normal The elevated alkaline phosphatase is likely from the bone marrow recovery

## 2018-07-04 NOTE — Progress Notes (Signed)
[facility] Spiritual Care Note  Referred by Dr Calton Dach RN for emotional support as pt appeared depressed and tearful. Connected with Stacy Shelton in infusion, where she shared the story of her diagnosis and the frustrating disappointment of setbacks and decline; particularly difficult for her are the inability to walk and to care for herself (basic ADLs) due to weakness. She describes herself as "weak as a kitten." For Stacy Shelton, being able to walk without assistance would be a huge gain. Unfortunately, our encounter got interrupted by an urgent chaplain page, so we plan to f/u by phone next week. Stacy Shelton described herself as "feeling lighter just from getting to tell my story."   Chaplain Shelva Majestic, [facility] Pager [phone] Voicemail [phone]

## 2018-07-04 NOTE — Progress Notes (Signed)
[place] OFFICE PROGRESS NOTE  Patient Care Team: Robyne Peers, MD as PCP - General (Family Medicine) Elouise Munroe, MD as PCP - Cardiology (Cardiology)  ASSESSMENT & PLAN:  Metastatic breast cancer [facility]) She is currently recovering well from side effects of treatment We will continue to hold treatment until improvement of her blood work and her overall performance status I will continue to see her on a weekly basis for further follow-up  Metastasis to lung [facility]) She does have any more cough Her breath sounds are improving She will continue oxygen therapy  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) She continues to have intermittent bone pain I have refilled her prescription IR morphine to take as needed  Malnutrition of moderate degree Overall, she is eating better She will continue on Remeron She will continue on nutritional supplement as tolerated  Pancytopenia, acquired ([place]) This is multifactorial She has improvement of blood count although she is still transfusion dependent We discussed some of the risks, benefits, and alternatives of blood transfusions. The patient is symptomatic from anemia and the hemoglobin level is critically low.  Some of the side-effects to be expected including risks of transfusion reactions, chills, infection, syndrome of volume overload and risk of hospitalization from various reasons and the patient is willing to proceed and went ahead to sign consent today. She will get blood to keep hemoglobin greater than 7 She does not need platelet transfusion or G-CSF   No orders of the defined types were placed in this encounter.   INTERVAL HISTORY: Please see below for problem oriented charting. She returns for further follow-up She is seen multiple  times in the infusion room She is placed in the infusion room due to decubitus ulcer and she is more comfortable lying in bed Her cough has resolved She is breathing better Denies fever or chills Her appetite is slowly improving She denies recent changes in bowel habits She had new bruising due to recent accidental injury at home but denies bleeding.  Her pressure sores are healing She is participating in physical therapy She appears highly motivated to improve  SUMMARY OF ONCOLOGIC HISTORY: Oncology History   Biopsy is ER negative Her2/neu positive     Metastatic breast cancer ([place])   03/11/2018 Imaging    Ct scan of chest, abdomen and pelvis 1. 2.6 x 2.3 cm spiculated mass identified inferomedial quadrant of the left breast. Lesion appears to retract the overlying skin. Mammographic correlation recommended. 2. Small lymph nodes in the left axillar ill-defined and suspicious for metastatic disease. 3. Multiple bilateral pulmonary nodules measuring up to 2.7 cm diameter. These probably represent metastatic disease. Given the dominant size of the central right upper lobe lesion, synchronous lung primary can not be completely excluded. 4. Multiple ill-defined liver lesions compatible with metastatic disease. Dominant liver metastases measure up to almost 4 cm. 5.  Aortic Atherosclerois (ICD10-170.0)     03/12/2018 Pathology Results    Liver, needle/core biopsy, left lobe - METASTATIC CARCINOMA. - LYMPHOVASCULAR INVASION IS IDENTIFIED. - SEE COMMENT. Microscopic Comment The tumor cells are positive for cytokeratin 7. There is faint staining for GATA-3. There is non-specific staining for TTF-1. Cytokeratin 20, CDX-2, estrogen receptor, and GCDFP stains are negative. Given the clinical suspicion, the profile supports a primary breast carcinoma. Her2 will be performed and  the results reported separately.  By immunohistochemistry, the tumor cells are POSITIVE for Her2 (3+).    03/12/2018  Imaging    Single 1.3 x 1.4 cm RIGHT occipital metastasis.  Borderline pachymeningeal enhancement of symmetric nature could be related to the superficial metastasis or osseous disease.    03/12/2018 Procedure    Ultrasound-guided core biopsy performed of a mass within the lateral segment of the left lobe of the liver.    03/16/2018 Imaging    Right occipital lobe 13 x 16 mm. Small satellite enhancing nodule deep to the larger lesion. The larger lesion shows central necrosis and probable mild hemorrhage or calcification.  Mild dural enhancement is less impressive and could be within normal limits.  Bone marrow in the clivus and cervical spine is diffusely low signal. No focal lesion. This may be related to the patient's anemia and abnormal blood count.    03/17/2018 Cancer Staging    Staging form: Breast, AJCC 8th Edition - Clinical: Stage IV (cT2, cN0, pM1, GX, ER-, PR: Not Assessed, HER2+) - Signed by [name], MD on 03/17/2018    03/19/2018 -  Chemotherapy    She received chemo with Taxotere, Herceptin and Perjeta    03/21/2018 Echocardiogram    IMPRESSIONS 1. The left ventricle has normal systolic function with an ejection fraction of 60-65%. The cavity size was normal. Left ventricular diastolic Doppler parameters are consistent with impaired relaxation.  2. The right ventricle has normal systolic function. The cavity was normal. There is no increase in right ventricular wall thickness.  3. The mitral valve is normal in structure.  4. The tricuspid valve is normal in structure.  5. Strain imaging performed but not reported due to interpreter judgement, secondary to suboptimal image quality.    03/24/2018 Procedure    Successful placement of a right IJ approach Power Port with ultrasound and fluoroscopic guidance. The catheter is ready for use.    04/07/2018 - 04/16/2018 Hospital Admission    She was admitted for pneumonia    04/07/2018 - 04/16/2018 Hospital Admission    She was  admitted to the hospital for pneumonia and respiratory failure    04/25/2018 Imaging    Retropharyngeal fluid collection compatible with effusion or possibly abscess. Soft tissue thickening extends into the right neck and surrounds the right carotid bifurcation and right internal carotid artery. There is also streaky density in the right lateral neck soft tissues with a focal 7.6 Mm lymph node in the right lateral neck. Stranding surrounds the right submandibular gland.   Findings are most compatible with infection. Favor pharyngitis with retropharyngeal effusion/abscess. Soft tissue thickening around the right carotid most compatible with infection rather than tumor. Right jugular vein is patent.  Multiple nodular densities in the right upper lobe may represent residual pneumonia or metastatic disease. Interval improvement in right upper lobe infiltrate compared with CT of 04/08/2018    04/25/2018 - 05/06/2018 Hospital Admission    She was admitted for retropharyngeal abscess    04/27/2018 Imaging    No evidence of large central pulmonary embolus is noted in the main pulmonary artery or the main portions of the right and left pulmonary arteries. However, evaluation of the lower lobe branches, particularly on the right, is limited due to respiratory motion artifact and other limiting issues. Pulmonary emboli and smaller peripheral branches can not be excluded on the basis of this exam.  Large left pleural effusion is noted with complete atelectasis of the left lower lobe. Increased left upper  lobe opacity is noted posteriorly concerning for atelectasis or pneumonia.  Improved right upper lobe and lower lobe opacities are noted suggesting improving pneumonia or atelectasis.  15 mm right paratracheal lymph node is noted which is enlarged compared to prior exam; it is uncertain if this is metastatic or inflammatory in etiology.  Hepatic metastatic lesions are again noted.  Aortic  Atherosclerosis (ICD10-I70.0).     04/27/2018 Procedure    Successful ultrasound guided left thoracentesis yielding 120 mL of blood-tinged of pleural fluid.    06/11/2018 Imaging    1. No evidence of pulmonary emboli. 2. Extensive bilateral lower lobe consolidation. 3. Small pleural effusions. 4. 2.5 cm right upper lobe nodule, enlarged from 04/27/2018 and which may reflect a metastasis or primary lung cancer. Stable to mild enlargement of subcentimeter nodules in both upper lobes consistent with known metastases. 5. Decreased size of liver metastases. 6. Unchanged mild mediastinal and right hilar lymphadenopathy. 7. Aortic Atherosclerosis (ICD10-I70.0).    06/11/2018 - 06/22/2018 Hospital Admission    She was hospitalized for infection and SVT     Metastasis to brain [facility]   03/17/2018 Initial Diagnosis    Metastasis to brain [facility])     Metastasis to liver ([place])   03/17/2018 Initial Diagnosis    Metastasis to liver [facility])     Metastasis to bone ([place])   03/17/2018 Initial Diagnosis    Metastasis to bone ([facility])     REVIEW OF SYSTEMS:   Constitutional: Denies fevers, chills or abnormal weight loss Eyes: Denies blurriness of vision Ears, nose, mouth, throat, and face: Denies mucositis or sore throat Respiratory: Denies cough, dyspnea or wheezes Cardiovascular: Denies palpitation, chest discomfort or lower extremity swelling Gastrointestinal:  Denies nausea, heartburn or change in bowel habits Skin: Denies abnormal skin rashes Lymphatics: Denies new lymphadenopathy  Behavioral/Psych: Mood is stable, no new changes  All other systems were reviewed with the patient and are negative.  I have reviewed the past medical history, past surgical history, social history and family history with the patient and they are unchanged from previous note.  ALLERGIES:  is allergic to nsaids.  MEDICATIONS:  Current Outpatient Medications  Medication Sig Dispense Refill  . ACCU-CHEK AVIVA PLUS  test strip     . ACCU-CHEK SOFTCLIX LANCETS lancets     . acetaminophen (TYLENOL) 325 MG tablet Take 2 tablets (650 mg total) by mouth every 6 (six) hours as needed for mild pain (or Fever >/= 101). 30 tablet 0  . albuterol (PROAIR HFA) 108 (90 Base) MCG/ACT inhaler Inhale 2 puffs into the lungs every 6 (six) hours as needed for wheezing or shortness of breath. 1 Inhaler 1  . amiodarone (PACERONE) 200 MG tablet Take 1 tablet (200 mg total) by mouth daily. 30 tablet 0  . benzonatate (TESSALON) 200 MG capsule Take 1 capsule (200 mg total) by mouth 3 (three) times daily as needed for cough (use second). 20 capsule 0  . blood glucose meter kit and supplies KIT Dispense based on patient and insurance preference. Use up to four times daily as directed. (FOR ICD-9 250.00, 250.01). 1 each 0  . esomeprazole (NEXIUM) 40 MG capsule Take 1 capsule (40 mg total) by mouth 2 (two) times daily before a meal. 180 capsule 1  . feeding supplement, ENSURE ENLIVE, (ENSURE ENLIVE) LIQD Take 237 mLs by mouth 3 (three) times daily between meals. 237 mL 12  . fluticasone (FLOVENT HFA) 44 MCG/ACT inhaler Inhale 2 puffs into the lungs 2 (two) times daily. 3  Inhaler 1  . insulin aspart (NOVOLOG) 100 UNIT/ML FlexPen Inject 8 Units into the skin 3 (three) times daily with meals. (Patient taking differently: Inject 8 Units into the skin 3 (three) times daily with meals. Holds dose if not going to eat.) 15 mL 11  . Insulin Detemir (LEVEMIR) 100 UNIT/ML Pen Inject 10 Units into the skin daily. (Patient taking differently: Inject 10 Units into the skin at bedtime. ) 15 mL 0  . Insulin Pen Needle 31G X 5 MM MISC 1 Device by Does not apply route QID. For use with insulin pens 100 each 0  . lidocaine-prilocaine (EMLA) cream Apply to affected area once (Patient taking differently: Apply 1 application topically as needed (port access). Apply to affected area once) 30 g 3  . LORazepam (ATIVAN) 0.5 MG tablet Take 1 tab po 30 minutes prior to  radiation or MRI (Patient taking differently: Take 0.5 mg by mouth See admin instructions. Take 30 minutes prior to radiation or MRI) 30 tablet 0  . metFORMIN (GLUCOPHAGE) 500 MG tablet Take 500 mg by mouth daily with breakfast.     . metoprolol tartrate (LOPRESSOR) 25 MG tablet Take 0.5 tablets (12.5 mg total) by mouth 2 (two) times daily. 60 tablet 0  . mirtazapine (REMERON) 15 MG tablet Take 1 tablet (15 mg total) by mouth at bedtime. 30 tablet 0  . montelukast (SINGULAIR) 10 MG tablet Take 1 tablet (10 mg total) by mouth daily. 30 tablet 1  . morphine (MSIR) 15 MG tablet Take 1 tablet (15 mg total) by mouth every 4 (four) hours as needed for severe pain. 60 tablet 0  . Multiple Vitamin (MULTIVITAMIN WITH MINERALS) TABS tablet Take 1 tablet by mouth daily.    . ondansetron (ZOFRAN) 8 MG tablet Take 8 mg by mouth every 8 (eight) hours as needed for nausea or vomiting.     . prochlorperazine (COMPAZINE) 10 MG tablet Take 10 mg by mouth every 6 (six) hours as needed for nausea or vomiting.      No current facility-administered medications for this visit.    Facility-Administered Medications Ordered in Other Visits  Medication Dose Route Frequency Provider Last Rate Last Dose  . heparin lock flush 100 unit/mL  250 Units Intracatheter PRN Alvy Bimler, Yurem Viner, MD      . heparin lock flush 100 unit/mL  500 Units Intracatheter Daily PRN Alvy Bimler, Ifrah Vest, MD      . sodium chloride flush (NS) 0.9 % injection 10 mL  10 mL Intracatheter PRN Leyana Whidden, MD      . sodium chloride flush (NS) 0.9 % injection 3 mL  3 mL Intracatheter PRN Tanush Drees, MD        PHYSICAL EXAMINATION: ECOG PERFORMANCE STATUS: 2 - Symptomatic, <50% confined to bed GENERAL:alert, no distress and comfortable SKIN: skin color, texture, turgor are normal, no rashes or significant lesions. Noted some skin bruises EYES: normal, Conjunctiva are pink and non-injected, sclera clear OROPHARYNX:no exudate, no erythema and lips, buccal mucosa, and  tongue normal  NECK: supple, thyroid normal size, non-tender, without nodularity LYMPH:  no palpable lymphadenopathy in the cervical, axillary or inguinal LUNGS: reduced breath sounds on both lung bases HEART: regular rate & rhythm and no murmurs and no lower extremity edema ABDOMEN:abdomen soft, non-tender and normal bowel sounds Musculoskeletal:no cyanosis of digits and no clubbing  NEURO: alert & oriented x 3 with fluent speech, no focal motor/sensory deficits  LABORATORY DATA:  I have reviewed the data as listed  Component Value Date/Time   NA 135 07/04/2018 0843   K 4.0 07/04/2018 0843   CL 94 (L) 07/04/2018 0843   CO2 30 07/04/2018 0843   GLUCOSE 243 (H) 07/04/2018 0843   BUN 21 (H) 07/04/2018 0843   CREATININE 0.72 07/04/2018 0843   CALCIUM 8.7 (L) 07/04/2018 0843   PROT 7.7 07/04/2018 0843   ALBUMIN 1.8 (L) 07/04/2018 0843   AST 18 07/04/2018 0843   ALT 12 07/04/2018 0843   ALKPHOS 217 (H) 07/04/2018 0843   BILITOT 0.5 07/04/2018 0843   GFRNONAA >60 07/04/2018 0843   GFRAA >60 07/04/2018 0843    No results found for: SPEP, UPEP  Lab Results  Component Value Date   WBC 6.1 07/04/2018   NEUTROABS 2.9 07/04/2018   HGB 6.6 (LL) 07/04/2018   HCT 21.2 (L) 07/04/2018   MCV 91.5 07/04/2018   PLT 34 (L) 07/04/2018      Chemistry      Component Value Date/Time   NA 135 07/04/2018 0843   K 4.0 07/04/2018 0843   CL 94 (L) 07/04/2018 0843   CO2 30 07/04/2018 0843   BUN 21 (H) 07/04/2018 0843   CREATININE 0.72 07/04/2018 0843      Component Value Date/Time   CALCIUM 8.7 (L) 07/04/2018 0843   ALKPHOS 217 (H) 07/04/2018 0843   AST 18 07/04/2018 0843   ALT 12 07/04/2018 0843   BILITOT 0.5 07/04/2018 0843       RADIOGRAPHIC STUDIES: I have personally reviewed the radiological images as listed and agreed with the findings in the report. Ct Angio Chest Pe W Or Wo Contrast  Result Date: 06/11/2018 CLINICAL DATA:  Shortness of breath. Metastatic breast cancer.  EXAM: CT ANGIOGRAPHY CHEST WITH CONTRAST TECHNIQUE: Multidetector CT imaging of the chest was performed using the standard protocol during bolus administration of intravenous contrast. Multiplanar CT image reconstructions and MIPs were obtained to evaluate the vascular anatomy. CONTRAST:  63m OMNIPAQUE IOHEXOL 350 MG/ML SOLN COMPARISON:  Chest radiograph 06/11/2018. Thoracic spine CT 05/19/2018. Chest CTA 04/27/2018. FINDINGS: Cardiovascular: Pulmonary arterial opacification is adequate without evidence of lobar more proximal emboli. No segmental emboli are identified although assessment is limited in some areas due to motion artifact. Mild thoracic aortic atherosclerosis is noted without aneurysm. The heart is enlarged. There is no pericardial effusion. A right jugular Port-A-Cath terminates in the high right atrium. Mediastinum/Nodes: Calcified subcarinal and right hilar lymph nodes are again noted. Mildly enlarged noncalcified lymph nodes including a 10 mm short axis right paratracheal lymph node, levin mm precarinal lymph node, an 11 mm right hilar lymph node are unchanged from the prior chest CTA. The esophagus and thyroid are unremarkable. Lungs/Pleura: There are small bilateral pleural effusions, unchanged on the right and decreased in size on the left compared to the prior chest CTA. Extensive right lower lobe consolidation has greatly progressed from the prior chest CTA with progression also noted compared to the more recent thoracic spine CT. There is mildly improved aeration of the left lower lobe compared to the prior chest CTA, however moderately extensive consolidation and volume loss remain. A 2.5 x 2.2 cm right upper lobe nodule is unchanged from the thoracic spine CT though has enlarged from the chest CTA (series 10, image 51), as has an 8 mm nodule more anterolaterally in the right upper lobe (series 10, image 50). Multiple subcentimeter nodules are present in the left upper lobe with some being  larger compared to the prior chest CT and others being  unchanged. Upper Abdomen: Liver lesions have decreased in size, for example a segment VII/VIII lesion now measures 16 mm (series 4, image 85, previously 23 mm). Calcified granulomas are noted in the spleen. Musculoskeletal: Unchanged subcentimeter sclerotic foci in the T11 and T12 vertebral bodies, left glenoid, and inferior right scapular body. Review of the MIP images confirms the above findings. IMPRESSION: 1. No evidence of pulmonary emboli. 2. Extensive bilateral lower lobe consolidation. 3. Small pleural effusions. 4. 2.5 cm right upper lobe nodule, enlarged from 04/27/2018 and which may reflect a metastasis or primary lung cancer. Stable to mild enlargement of subcentimeter nodules in both upper lobes consistent with known metastases. 5. Decreased size of liver metastases. 6. Unchanged mild mediastinal and right hilar lymphadenopathy. 7. Aortic Atherosclerosis (ICD10-I70.0). Electronically Signed   By: Logan Bores M.D.   On: 06/11/2018 17:02   Dg Chest Port 1 View  Result Date: 06/22/2018 CLINICAL DATA:  Shortness of breath EXAM: PORTABLE CHEST 1 VIEW COMPARISON:  Jun 21, 2018 FINDINGS: Port-A-Cath tip is in the superior vena cava of the cavoatrial junction. No pneumothorax. Right pleural effusion which may be partially loculated persists. There is patchy bibasilar atelectasis. There may be a degree of superimposed consolidation in the right base. Heart is mildly enlarged with pulmonary vascularity normal. No adenopathy. There is aortic atherosclerosis. No bone lesions. IMPRESSION: Right pleural effusion with atelectasis and probable superimposed consolidation in the right base region. Patchy atelectasis in the left base region is stable. No new opacity is evident compared to 1 day prior. Stable cardiac prominence. Aortic Atherosclerosis (ICD10-I70.0). Electronically Signed   By: Lowella Grip III M.D.   On: 06/22/2018 08:00   Dg Chest Port 1  View  Result Date: 06/21/2018 CLINICAL DATA:  58 year old female with history of shortness of breath. EXAM: PORTABLE CHEST 1 VIEW COMPARISON:  Chest x-ray 06/20/2018. FINDINGS: Right internal jugular single-lumen porta cath with tip terminating in the distal superior vena cava. Lung volumes are low. Moderate right pleural effusion which appears partially loculated, with fluid extending into the apex of the right hemithorax. Small left pleural effusion. Bibasilar opacities (right greater than left), which may reflect areas of atelectasis and/or consolidation. No evidence of pulmonary edema. Heart size is borderline enlarged. Upper mediastinal contours are within normal limits. IMPRESSION: 1. Support apparatus, as above. 2. Moderate partially loculated right pleural effusion. Small left pleural effusion. These are associated with bibasilar (right greater than left) areas of atelectasis and/or consolidation in the lung bases bilaterally. Electronically Signed   By: Vinnie Langton M.D.   On: 06/21/2018 07:26   Dg Chest Port 1 View  Result Date: 06/20/2018 CLINICAL DATA:  Shortness of breath. EXAM: PORTABLE CHEST 1 VIEW COMPARISON:  Radiograph Jun 19, 2018. FINDINGS: Stable cardiomegaly. No pneumothorax is noted. Right internal jugular Port-A-Cath is unchanged in position. Stable bilateral lung opacities are noted concerning for edema or atelectasis with associated pleural effusions, right greater than left. Bony thorax is unremarkable. IMPRESSION: Stable bilateral lung opacities concerning for edema or atelectasis with associated pleural effusions. Electronically Signed   By: Marijo Conception M.D.   On: 06/20/2018 08:22   Dg Chest Port 1 View  Result Date: 06/19/2018 CLINICAL DATA:  History of metastatic breast cancer. EXAM: PORTABLE CHEST 1 VIEW COMPARISON:  Jun 17, 2018 FINDINGS: Right Port-A-Cath is stable. Bilateral pleural effusions, right greater than left with underlying atelectasis/consolidation.  Cardiomediastinal silhouette is stable. IMPRESSION: No interval change in bilateral pleural effusions with underlying atelectasis/consolidation. Stable right Port-A-Cath. Electronically  Signed   By: Dorise Bullion III M.D   On: 06/19/2018 10:10   Dg Chest Port 1 View  Result Date: 06/17/2018 CLINICAL DATA:  58 year old female with respiratory distress EXAM: PORTABLE CHEST 1 VIEW COMPARISON:  06/16/2018, 06/12/2018, CT 06/11/2018 FINDINGS: Cardiomediastinal silhouette likely unchanged in size and contour. Heart borders partially obscured by overlying lung and pleural disease. Opacities at the bilateral lung bases obscuring the hemidiaphragms and heart borders, appears unchanged from prior. Graded opacities towards the bases. Right hilar opacity persists. Partially calcified nodes at the right hilum. No pneumothorax. Right IJ port catheter. No displaced fracture IMPRESSION: Similar appearance of the chest x-ray, with bilateral right greater than left pleural effusions and associated atelectasis/consolidation. Right hilar opacities better seen on recent CT imaging. Unchanged right IJ port catheter. Electronically Signed   By: Corrie Mckusick D.O.   On: 06/17/2018 08:05   Dg Chest Port 1 View  Result Date: 06/16/2018 CLINICAL DATA:  Metastatic breast cancer pneumonia EXAM: PORTABLE CHEST 1 VIEW COMPARISON:  06/12/2018 FINDINGS: Right-sided Port-A-Cath with the tip projecting over the SVC. Small right pleural effusion. Bilateral lower lobe airspace disease most concerning for pneumonia. Persistent right upper lobe pulmonary nodule consistent malignancy. No pneumothorax. Stable cardiomediastinal silhouette. No aggressive osseous lesion. IMPRESSION: 1. Bilateral lower lobe airspace disease most consistent with pneumonia. Small right pleural effusion. 2. Persistent right upper lobe pulmonary nodule consistent with malignancy. Electronically Signed   By: Kathreen Devoid   On: 06/16/2018 08:49   Dg Chest Port 1  View  Result Date: 06/12/2018 CLINICAL DATA:  58 year old female with possible pneumonia in cancer patient. EXAM: PORTABLE CHEST 1 VIEW COMPARISON:  06/11/2018 and earlier. FINDINGS: Portable AP semi upright view at 0943 hours. Stable right chest power port, accessed. Increasing confluence of mid and lower lung opacity. Consolidation demonstrated by CT yesterday. Trace pleural fluid more apparent by CT. No superimposed pneumothorax or pulmonary edema. Stable cardiac size and mediastinal contours. Visualized tracheal air column is within normal limits. IMPRESSION: Mild radiographic progression of bilateral consolidation/pneumonia since yesterday. Electronically Signed   By: Genevie Ann M.D.   On: 06/12/2018 10:37   Dg Chest Port 1 View  Result Date: 06/11/2018 CLINICAL DATA:  Hypotension. EXAM: PORTABLE CHEST 1 VIEW COMPARISON:  Radiograph of May 19, 2018. FINDINGS: Stable cardiomediastinal silhouette. Right internal jugular Port-A-Cath is unchanged in position. No pneumothorax is noted. Increased bibasilar opacities are noted concerning for pneumonia or edema. Small pleural effusions may be present. Bony thorax is unremarkable. IMPRESSION: Increased bibasilar opacities are noted concerning for worsening pneumonia or edema. Small pleural effusions may be present. Electronically Signed   By: Marijo Conception M.D.   On: 06/11/2018 11:43    All questions were answered. The patient knows to call the clinic with any problems, questions or concerns. No barriers to learning was detected.  I spent 40 minutes counseling the patient face to face. The total time spent in the appointment was 55 minutes and more than 50% was on counseling and review of test results  [name], MD 07/04/2018 2:12 PM

## 2018-07-04 NOTE — Assessment & Plan Note (Addendum)
She continues to have intermittent bone pain I have refilled her prescription IR morphine to take as needed

## 2018-07-04 NOTE — Assessment & Plan Note (Signed)
She does have any more cough Her breath sounds are improving She will continue oxygen therapy

## 2018-07-05 LAB — BPAM RBC
Blood Product Expiration Date: 202006182359
Blood Product Expiration Date: 202006202359
ISSUE DATE / TIME: 202006051038
ISSUE DATE / TIME: 202006051443
Unit Type and Rh: 6200
Unit Type and Rh: 6200

## 2018-07-05 LAB — TYPE AND SCREEN
ABO/RH(D): A POS
Antibody Screen: NEGATIVE
Unit division: 0
Unit division: 0

## 2018-07-07 ENCOUNTER — Telehealth: Payer: Self-pay | Admitting: Hematology and Oncology

## 2018-07-07 NOTE — Telephone Encounter (Signed)
Talk with patient regarding schedule °

## 2018-07-08 ENCOUNTER — Encounter: Payer: Self-pay | Admitting: General Practice

## 2018-07-08 NOTE — Progress Notes (Signed)
[facility] Spiritual Care Note  Followed up with Zamora by phone as planned. She was in very good spirits today, citing much more energy and strength in comparison to how she felt last week, thanks to blood transfusion. Per pt, she can stand on her own now (with assistance nearby if needed) and get on and off the commode independently, two welcome steps toward renewed independence. She is hopeful that the planned series of transfusions will strengthen her enough to be able to resume chemotherapy. Served as a witness to her story and celebrated her achievements and relief with her. We plan to f/u by phone next week.   [place], [place], [facility] Pager [phone] Voicemail [phone]

## 2018-07-11 ENCOUNTER — Inpatient Hospital Stay: Payer: BC Managed Care – PPO

## 2018-07-11 ENCOUNTER — Inpatient Hospital Stay (HOSPITAL_BASED_OUTPATIENT_CLINIC_OR_DEPARTMENT_OTHER): Payer: BC Managed Care – PPO | Admitting: Hematology and Oncology

## 2018-07-11 ENCOUNTER — Other Ambulatory Visit: Payer: Self-pay

## 2018-07-11 ENCOUNTER — Encounter: Payer: Self-pay | Admitting: Hematology and Oncology

## 2018-07-11 VITALS — BP 87/52 | HR 133 | Temp 98.5°F | Resp 18

## 2018-07-11 DIAGNOSIS — C50919 Malignant neoplasm of unspecified site of unspecified female breast: Secondary | ICD-10-CM

## 2018-07-11 DIAGNOSIS — Z794 Long term (current) use of insulin: Secondary | ICD-10-CM

## 2018-07-11 DIAGNOSIS — R5381 Other malaise: Secondary | ICD-10-CM

## 2018-07-11 DIAGNOSIS — Z171 Estrogen receptor negative status [ER-]: Secondary | ICD-10-CM

## 2018-07-11 DIAGNOSIS — D61818 Other pancytopenia: Secondary | ICD-10-CM

## 2018-07-11 DIAGNOSIS — I700 Atherosclerosis of aorta: Secondary | ICD-10-CM

## 2018-07-11 DIAGNOSIS — C7800 Secondary malignant neoplasm of unspecified lung: Secondary | ICD-10-CM | POA: Diagnosis not present

## 2018-07-11 DIAGNOSIS — D6481 Anemia due to antineoplastic chemotherapy: Secondary | ICD-10-CM

## 2018-07-11 DIAGNOSIS — C7951 Secondary malignant neoplasm of bone: Secondary | ICD-10-CM

## 2018-07-11 DIAGNOSIS — C7931 Secondary malignant neoplasm of brain: Secondary | ICD-10-CM

## 2018-07-11 DIAGNOSIS — E119 Type 2 diabetes mellitus without complications: Secondary | ICD-10-CM

## 2018-07-11 DIAGNOSIS — Z7984 Long term (current) use of oral hypoglycemic drugs: Secondary | ICD-10-CM

## 2018-07-11 DIAGNOSIS — C787 Secondary malignant neoplasm of liver and intrahepatic bile duct: Secondary | ICD-10-CM

## 2018-07-11 DIAGNOSIS — Z5112 Encounter for antineoplastic immunotherapy: Secondary | ICD-10-CM | POA: Diagnosis not present

## 2018-07-11 DIAGNOSIS — C50312 Malignant neoplasm of lower-inner quadrant of left female breast: Secondary | ICD-10-CM

## 2018-07-11 DIAGNOSIS — I10 Essential (primary) hypertension: Secondary | ICD-10-CM

## 2018-07-11 DIAGNOSIS — Z7951 Long term (current) use of inhaled steroids: Secondary | ICD-10-CM

## 2018-07-11 DIAGNOSIS — E440 Moderate protein-calorie malnutrition: Secondary | ICD-10-CM

## 2018-07-11 DIAGNOSIS — Z79899 Other long term (current) drug therapy: Secondary | ICD-10-CM

## 2018-07-11 DIAGNOSIS — K219 Gastro-esophageal reflux disease without esophagitis: Secondary | ICD-10-CM

## 2018-07-11 LAB — CMP (CANCER CENTER ONLY)
ALT: 17 U/L (ref 0–44)
AST: 18 U/L (ref 15–41)
Albumin: 2.3 g/dL — ABNORMAL LOW (ref 3.5–5.0)
Alkaline Phosphatase: 158 U/L — ABNORMAL HIGH (ref 38–126)
Anion gap: 12 (ref 5–15)
BUN: 26 mg/dL — ABNORMAL HIGH (ref 6–20)
CO2: 27 mmol/L (ref 22–32)
Calcium: 8.9 mg/dL (ref 8.9–10.3)
Chloride: 98 mmol/L (ref 98–111)
Creatinine: 0.78 mg/dL (ref 0.44–1.00)
GFR, Est AFR Am: >60 mL/min (ref >60)
GFR, Estimated: >60 mL/min (ref >60)
Glucose, Bld: 223 mg/dL — ABNORMAL HIGH (ref 70–99)
Potassium: 4.7 mmol/L (ref 3.5–5.1)
Sodium: 137 mmol/L (ref 135–145)
Total Bilirubin: 0.5 mg/dL (ref 0.3–1.2)
Total Protein: 7.4 g/dL (ref 6.5–8.1)

## 2018-07-11 LAB — CBC WITH DIFFERENTIAL (CANCER CENTER ONLY)
Abs Immature Granulocytes: 1.44 10*3/uL — ABNORMAL HIGH (ref 0.00–0.07)
Basophils Absolute: 0.7 10*3/uL — ABNORMAL HIGH (ref 0.0–0.1)
Basophils Relative: 7 %
Eosinophils Absolute: 0 10*3/uL (ref 0.0–0.5)
Eosinophils Relative: 0 %
HCT: 26.3 % — ABNORMAL LOW (ref 36.0–46.0)
Hemoglobin: 8.4 g/dL — ABNORMAL LOW (ref 12.0–15.0)
Immature Granulocytes: 16 %
Lymphocytes Relative: 22 %
Lymphs Abs: 2 10*3/uL (ref 0.7–4.0)
MCH: 28.6 pg (ref 26.0–34.0)
MCHC: 31.9 g/dL (ref 30.0–36.0)
MCV: 89.5 fL (ref 80.0–100.0)
Monocytes Absolute: 1.1 10*3/uL — ABNORMAL HIGH (ref 0.1–1.0)
Monocytes Relative: 13 %
Neutro Abs: 3.8 10*3/uL (ref 1.7–7.7)
Neutrophils Relative %: 42 %
Platelet Count: 39 10*3/uL — ABNORMAL LOW (ref 150–400)
RBC: 2.94 MIL/uL — ABNORMAL LOW (ref 3.87–5.11)
RDW: 15.6 % — ABNORMAL HIGH (ref 11.5–15.5)
WBC Count: 9.1 10*3/uL (ref 4.0–10.5)
nRBC: 0 % (ref 0.0–0.2)

## 2018-07-11 LAB — SAMPLE TO BLOOD BANK

## 2018-07-11 LAB — TYPE AND SCREEN
ABO/RH(D): A POS
Antibody Screen: NEGATIVE

## 2018-07-11 MED ORDER — HEPARIN SOD (PORK) LOCK FLUSH 100 UNIT/ML IV SOLN
500.0000 [IU] | Freq: Once | INTRAVENOUS | Status: AC
  Administered 2018-07-11: 500 [IU]
  Filled 2018-07-11: qty 5

## 2018-07-11 MED ORDER — SODIUM CHLORIDE 0.9% FLUSH
10.0000 mL | Freq: Once | INTRAVENOUS | Status: AC
  Administered 2018-07-11: 10 mL
  Filled 2018-07-11: qty 10

## 2018-07-11 MED ORDER — ESOMEPRAZOLE MAGNESIUM 40 MG PO CPDR: 40.0000 mg | DELAYED_RELEASE_CAPSULE | Freq: Every day | ORAL | 3 refills | Status: AC

## 2018-07-11 NOTE — Assessment & Plan Note (Signed)
She remains pancytopenic but overall asymptomatic She does not need transfusion support today

## 2018-07-11 NOTE — Progress Notes (Signed)
Dr. Alvy Bimler assessed patient in infusion.  No new orders, no blood transfusion today.

## 2018-07-11 NOTE — Assessment & Plan Note (Signed)
Overall, she is improving in is able to walk short distances at home with aggressive physical therapy and Occupational Therapy.  She will continue home PT

## 2018-07-11 NOTE — Assessment & Plan Note (Signed)
She is currently recovering well from side effects of treatment We will continue to hold treatment until improvement of her blood work and her overall performance status I will continue to see her on a weekly basis for further follow-up

## 2018-07-11 NOTE — Progress Notes (Signed)
[facility] OFFICE PROGRESS NOTE  Patient Care Team: Robyne Peers, MD as PCP - General (Family Medicine) Elouise Munroe, MD as PCP - Cardiology (Cardiology)  ASSESSMENT & PLAN:  Metastatic breast cancer [facility]) She is currently recovering well from side effects of treatment We will continue to hold treatment until improvement of her blood work and her overall performance status I will continue to see her on a weekly basis for further follow-up  Metastasis to brain [facility]) She is scheduled for MRI next week to reassess Clinically, she has no neurological deficits  Pancytopenia, acquired [facility]) She remains pancytopenic but overall asymptomatic She does not need transfusion support today  Physical debility Overall, she is improving in is able to walk short distances at home with aggressive physical therapy and Occupational Therapy.  She will continue home PT   No orders of the defined types were placed in this encounter.   INTERVAL HISTORY: Please see below for problem oriented charting. She returns for further follow-up She is doing well overall She has less cough although she has shortness of breath on minimal exertion She is able to ambulate short distances with walker No recent falls Her bruising is less The patient denies any recent signs or symptoms of bleeding such as spontaneous epistaxis, hematuria or hematochezia.  SUMMARY OF ONCOLOGIC HISTORY: Oncology History Overview Note  Biopsy is ER negative Her2/neu positive   Metastatic breast cancer ([place])  03/11/2018 Imaging   Ct scan of chest, abdomen and pelvis 1. 2.6 x 2.3 cm spiculated mass identified inferomedial quadrant of the left breast. Lesion appears to retract the overlying skin. Mammographic correlation recommended. 2. Small lymph nodes in the left axillar ill-defined and suspicious for metastatic disease. 3. Multiple bilateral pulmonary nodules measuring up to 2.7 cm diameter. These  probably represent metastatic disease. Given the dominant size of the central right upper lobe lesion, synchronous lung primary can not be completely excluded. 4. Multiple ill-defined liver lesions compatible with metastatic disease. Dominant liver metastases measure up to almost 4 cm. 5.  Aortic Atherosclerois (ICD10-170.0)    03/12/2018 Pathology Results   Liver, needle/core biopsy, left lobe - METASTATIC CARCINOMA. - LYMPHOVASCULAR INVASION IS IDENTIFIED. - SEE COMMENT. Microscopic Comment The tumor cells are positive for cytokeratin 7. There is faint staining for GATA-3. There is non-specific staining for TTF-1. Cytokeratin 20, CDX-2, estrogen receptor, and GCDFP stains are negative. Given the clinical suspicion, the profile supports a primary breast carcinoma. Her2 will be performed and the results reported separately.  By immunohistochemistry, the tumor cells are POSITIVE for Her2 (3+).   03/12/2018 Imaging   Single 1.3 x 1.4 cm RIGHT occipital metastasis.  Borderline pachymeningeal enhancement of symmetric nature could be related to the superficial metastasis or osseous disease.   03/12/2018 Procedure   Ultrasound-guided core biopsy performed of a mass within the lateral segment of the left lobe of the liver.   03/16/2018 Imaging   Right occipital lobe 13 x 16 mm. Small satellite enhancing nodule deep to the larger lesion. The larger lesion shows central necrosis and probable mild hemorrhage or calcification.  Mild dural enhancement is less impressive and could be within normal limits.  Bone marrow in the clivus and cervical spine is diffusely low signal. No focal lesion. This may be related to the patient's anemia and abnormal blood count.   03/17/2018 Cancer Staging   Staging form: Breast, AJCC 8th Edition - Clinical: Stage IV (cT2, cN0, pM1, GX, ER-, PR: Not Assessed, HER2+) -  Signed by [name], MD on 03/17/2018   03/19/2018 -  Chemotherapy   She received chemo with  Taxotere, Herceptin and Perjeta   03/21/2018 Echocardiogram   IMPRESSIONS 1. The left ventricle has normal systolic function with an ejection fraction of 60-65%. The cavity size was normal. Left ventricular diastolic Doppler parameters are consistent with impaired relaxation.  2. The right ventricle has normal systolic function. The cavity was normal. There is no increase in right ventricular wall thickness.  3. The mitral valve is normal in structure.  4. The tricuspid valve is normal in structure.  5. Strain imaging performed but not reported due to interpreter judgement, secondary to suboptimal image quality.   03/24/2018 Procedure   Successful placement of a right IJ approach Power Port with ultrasound and fluoroscopic guidance. The catheter is ready for use.   04/07/2018 - 04/16/2018 Hospital Admission   She was admitted for pneumonia   04/07/2018 - 04/16/2018 Hospital Admission   She was admitted to the hospital for pneumonia and respiratory failure   04/25/2018 Imaging   Retropharyngeal fluid collection compatible with effusion or possibly abscess. Soft tissue thickening extends into the right neck and surrounds the right carotid bifurcation and right internal carotid artery. There is also streaky density in the right lateral neck soft tissues with a focal 7.6 Mm lymph node in the right lateral neck. Stranding surrounds the right submandibular gland.   Findings are most compatible with infection. Favor pharyngitis with retropharyngeal effusion/abscess. Soft tissue thickening around the right carotid most compatible with infection rather than tumor. Right jugular vein is patent.  Multiple nodular densities in the right upper lobe may represent residual pneumonia or metastatic disease. Interval improvement in right upper lobe infiltrate compared with CT of 04/08/2018   04/25/2018 - 05/06/2018 Hospital Admission   She was admitted for retropharyngeal abscess   04/27/2018 Imaging   No evidence  of large central pulmonary embolus is noted in the main pulmonary artery or the main portions of the right and left pulmonary arteries. However, evaluation of the lower lobe branches, particularly on the right, is limited due to respiratory motion artifact and other limiting issues. Pulmonary emboli and smaller peripheral branches can not be excluded on the basis of this exam.  Large left pleural effusion is noted with complete atelectasis of the left lower lobe. Increased left upper lobe opacity is noted posteriorly concerning for atelectasis or pneumonia.  Improved right upper lobe and lower lobe opacities are noted suggesting improving pneumonia or atelectasis.  15 mm right paratracheal lymph node is noted which is enlarged compared to prior exam; it is uncertain if this is metastatic or inflammatory in etiology.  Hepatic metastatic lesions are again noted.  Aortic Atherosclerosis (ICD10-I70.0).    04/27/2018 Procedure   Successful ultrasound guided left thoracentesis yielding 120 mL of blood-tinged of pleural fluid.   06/11/2018 Imaging   1. No evidence of pulmonary emboli. 2. Extensive bilateral lower lobe consolidation. 3. Small pleural effusions. 4. 2.5 cm right upper lobe nodule, enlarged from 04/27/2018 and which may reflect a metastasis or primary lung cancer. Stable to mild enlargement of subcentimeter nodules in both upper lobes consistent with known metastases. 5. Decreased size of liver metastases. 6. Unchanged mild mediastinal and right hilar lymphadenopathy. 7. Aortic Atherosclerosis (ICD10-I70.0).   06/11/2018 - 06/22/2018 Hospital Admission   She was hospitalized for infection and SVT   Metastasis to brain [facility])  03/17/2018 Initial Diagnosis   Metastasis to brain [facility])   Metastasis to liver (  [place])  03/17/2018 Initial Diagnosis   Metastasis to liver [facility])   Metastasis to bone ([place])  03/17/2018 Initial Diagnosis   Metastasis to bone ([facility])     REVIEW OF  SYSTEMS:   Constitutional: Denies fevers, chills or abnormal weight loss Eyes: Denies blurriness of vision Ears, nose, mouth, throat, and face: Denies mucositis or sore throat Respiratory: Denies cough, dyspnea or wheezes Cardiovascular: Denies palpitation, chest discomfort or lower extremity swelling Gastrointestinal:  Denies nausea, heartburn or change in bowel habits Skin: Denies abnormal skin rashes Lymphatics: Denies new lymphadenopathy or easy bruising Neurological:Denies numbness, tingling or new weaknesses Behavioral/Psych: Mood is stable, no new changes  All other systems were reviewed with the patient and are negative.  I have reviewed the past medical history, past surgical history, social history and family history with the patient and they are unchanged from previous note.  ALLERGIES:  is allergic to nsaids.  MEDICATIONS:  Current Outpatient Medications  Medication Sig Dispense Refill  . ACCU-CHEK AVIVA PLUS test strip     . ACCU-CHEK SOFTCLIX LANCETS lancets     . acetaminophen (TYLENOL) 325 MG tablet Take 2 tablets (650 mg total) by mouth every 6 (six) hours as needed for mild pain (or Fever >/= 101). 30 tablet 0  . albuterol (PROAIR HFA) 108 (90 Base) MCG/ACT inhaler Inhale 2 puffs into the lungs every 6 (six) hours as needed for wheezing or shortness of breath. 1 Inhaler 1  . amiodarone (PACERONE) 200 MG tablet Take 1 tablet (200 mg total) by mouth daily. 30 tablet 0  . benzonatate (TESSALON) 200 MG capsule Take 1 capsule (200 mg total) by mouth 3 (three) times daily as needed for cough (use second). 20 capsule 0  . blood glucose meter kit and supplies KIT Dispense based on patient and insurance preference. Use up to four times daily as directed. (FOR ICD-9 250.00, 250.01). 1 each 0  . esomeprazole (NEXIUM) 40 MG capsule Take 1 capsule (40 mg total) by mouth daily at 12 noon. 90 capsule 3  . feeding supplement, ENSURE ENLIVE, (ENSURE ENLIVE) LIQD Take 237 mLs by mouth 3  (three) times daily between meals. 237 mL 12  . fluticasone (FLOVENT HFA) 44 MCG/ACT inhaler Inhale 2 puffs into the lungs 2 (two) times daily. 3 Inhaler 1  . insulin aspart (NOVOLOG) 100 UNIT/ML FlexPen Inject 8 Units into the skin 3 (three) times daily with meals. (Patient taking differently: Inject 8 Units into the skin 3 (three) times daily with meals. Holds dose if not going to eat.) 15 mL 11  . Insulin Detemir (LEVEMIR) 100 UNIT/ML Pen Inject 10 Units into the skin daily. (Patient taking differently: Inject 10 Units into the skin at bedtime. ) 15 mL 0  . Insulin Pen Needle 31G X 5 MM MISC 1 Device by Does not apply route QID. For use with insulin pens 100 each 0  . lidocaine-prilocaine (EMLA) cream Apply to affected area once (Patient taking differently: Apply 1 application topically as needed (port access). Apply to affected area once) 30 g 3  . LORazepam (ATIVAN) 0.5 MG tablet Take 1 tab po 30 minutes prior to radiation or MRI (Patient taking differently: Take 0.5 mg by mouth See admin instructions. Take 30 minutes prior to radiation or MRI) 30 tablet 0  . metFORMIN (GLUCOPHAGE) 500 MG tablet Take 500 mg by mouth daily with breakfast.     . metoprolol tartrate (LOPRESSOR) 25 MG tablet Take 0.5 tablets (12.5 mg total) by mouth 2 (two) times  daily. 60 tablet 0  . mirtazapine (REMERON) 15 MG tablet Take 1 tablet (15 mg total) by mouth at bedtime. 30 tablet 0  . montelukast (SINGULAIR) 10 MG tablet Take 1 tablet (10 mg total) by mouth daily. 30 tablet 1  . morphine (MSIR) 15 MG tablet Take 1 tablet (15 mg total) by mouth every 4 (four) hours as needed for severe pain. 60 tablet 0  . Multiple Vitamin (MULTIVITAMIN WITH MINERALS) TABS tablet Take 1 tablet by mouth daily.    . ondansetron (ZOFRAN) 8 MG tablet Take 8 mg by mouth every 8 (eight) hours as needed for nausea or vomiting.     . prochlorperazine (COMPAZINE) 10 MG tablet Take 10 mg by mouth every 6 (six) hours as needed for nausea or  vomiting.      No current facility-administered medications for this visit.    Facility-Administered Medications Ordered in Other Visits  Medication Dose Route Frequency Provider Last Rate Last Dose  . heparin lock flush 100 unit/mL  250 Units Intracatheter PRN Jasara Corrigan, MD      . sodium chloride flush (NS) 0.9 % injection 3 mL  3 mL Intracatheter PRN Haven Foss, MD        PHYSICAL EXAMINATION: ECOG PERFORMANCE STATUS: 2 - Symptomatic, <50% confined to bed GENERAL:alert, no distress and comfortable SKIN: skin color, texture, turgor are normal, no rashes or significant lesions.  Noted skin bruises EYES: normal, Conjunctiva are pink and non-injected, sclera clear OROPHARYNX:no exudate, no erythema and lips, buccal mucosa, and tongue normal  NECK: supple, thyroid normal size, non-tender, without nodularity LYMPH:  no palpable lymphadenopathy in the cervical, axillary or inguinal LUNGS: clear to auscultation and percussion with normal breathing effort HEART: regular rate & rhythm and no murmurs and no lower extremity edema ABDOMEN:abdomen soft, non-tender and normal bowel sounds Musculoskeletal:no cyanosis of digits and no clubbing  NEURO: alert & oriented x 3 with fluent speech, no focal motor/sensory deficits  LABORATORY DATA:  I have reviewed the data as listed    Component Value Date/Time   NA 137 07/11/2018 0808   K 4.7 07/11/2018 0808   CL 98 07/11/2018 0808   CO2 27 07/11/2018 0808   GLUCOSE 223 (H) 07/11/2018 0808   BUN 26 (H) 07/11/2018 0808   CREATININE 0.78 07/11/2018 0808   CALCIUM 8.9 07/11/2018 0808   PROT 7.4 07/11/2018 0808   ALBUMIN 2.3 (L) 07/11/2018 0808   AST 18 07/11/2018 0808   ALT 17 07/11/2018 0808   ALKPHOS 158 (H) 07/11/2018 0808   BILITOT 0.5 07/11/2018 0808   GFRNONAA >60 07/11/2018 0808   GFRAA >60 07/11/2018 0808    No results found for: SPEP, UPEP  Lab Results  Component Value Date   WBC 9.1 07/11/2018   NEUTROABS PENDING 07/11/2018    HGB 8.4 (L) 07/11/2018   HCT 26.3 (L) 07/11/2018   MCV 89.5 07/11/2018   PLT 39 (L) 07/11/2018      Chemistry      Component Value Date/Time   NA 137 07/11/2018 0808   K 4.7 07/11/2018 0808   CL 98 07/11/2018 0808   CO2 27 07/11/2018 0808   BUN 26 (H) 07/11/2018 0808   CREATININE 0.78 07/11/2018 0808      Component Value Date/Time   CALCIUM 8.9 07/11/2018 0808   ALKPHOS 158 (H) 07/11/2018 0808   AST 18 07/11/2018 0808   ALT 17 07/11/2018 0808   BILITOT 0.5 07/11/2018 0808       RADIOGRAPHIC STUDIES:  I have personally reviewed the radiological images as listed and agreed with the findings in the report. Ct Angio Chest Pe W Or Wo Contrast  Result Date: 06/11/2018 CLINICAL DATA:  Shortness of breath. Metastatic breast cancer. EXAM: CT ANGIOGRAPHY CHEST WITH CONTRAST TECHNIQUE: Multidetector CT imaging of the chest was performed using the standard protocol during bolus administration of intravenous contrast. Multiplanar CT image reconstructions and MIPs were obtained to evaluate the vascular anatomy. CONTRAST:  61m OMNIPAQUE IOHEXOL 350 MG/ML SOLN COMPARISON:  Chest radiograph 06/11/2018. Thoracic spine CT 05/19/2018. Chest CTA 04/27/2018. FINDINGS: Cardiovascular: Pulmonary arterial opacification is adequate without evidence of lobar more proximal emboli. No segmental emboli are identified although assessment is limited in some areas due to motion artifact. Mild thoracic aortic atherosclerosis is noted without aneurysm. The heart is enlarged. There is no pericardial effusion. A right jugular Port-A-Cath terminates in the high right atrium. Mediastinum/Nodes: Calcified subcarinal and right hilar lymph nodes are again noted. Mildly enlarged noncalcified lymph nodes including a 10 mm short axis right paratracheal lymph node, levin mm precarinal lymph node, an 11 mm right hilar lymph node are unchanged from the prior chest CTA. The esophagus and thyroid are unremarkable. Lungs/Pleura: There  are small bilateral pleural effusions, unchanged on the right and decreased in size on the left compared to the prior chest CTA. Extensive right lower lobe consolidation has greatly progressed from the prior chest CTA with progression also noted compared to the more recent thoracic spine CT. There is mildly improved aeration of the left lower lobe compared to the prior chest CTA, however moderately extensive consolidation and volume loss remain. A 2.5 x 2.2 cm right upper lobe nodule is unchanged from the thoracic spine CT though has enlarged from the chest CTA (series 10, image 51), as has an 8 mm nodule more anterolaterally in the right upper lobe (series 10, image 50). Multiple subcentimeter nodules are present in the left upper lobe with some being larger compared to the prior chest CT and others being unchanged. Upper Abdomen: Liver lesions have decreased in size, for example a segment VII/VIII lesion now measures 16 mm (series 4, image 85, previously 23 mm). Calcified granulomas are noted in the spleen. Musculoskeletal: Unchanged subcentimeter sclerotic foci in the T11 and T12 vertebral bodies, left glenoid, and inferior right scapular body. Review of the MIP images confirms the above findings. IMPRESSION: 1. No evidence of pulmonary emboli. 2. Extensive bilateral lower lobe consolidation. 3. Small pleural effusions. 4. 2.5 cm right upper lobe nodule, enlarged from 04/27/2018 and which may reflect a metastasis or primary lung cancer. Stable to mild enlargement of subcentimeter nodules in both upper lobes consistent with known metastases. 5. Decreased size of liver metastases. 6. Unchanged mild mediastinal and right hilar lymphadenopathy. 7. Aortic Atherosclerosis (ICD10-I70.0). Electronically Signed   By: ALogan BoresM.D.   On: 06/11/2018 17:02   Dg Chest Port 1 View  Result Date: 06/22/2018 CLINICAL DATA:  Shortness of breath EXAM: PORTABLE CHEST 1 VIEW COMPARISON:  Jun 21, 2018 FINDINGS: Port-A-Cath tip  is in the superior vena cava of the cavoatrial junction. No pneumothorax. Right pleural effusion which may be partially loculated persists. There is patchy bibasilar atelectasis. There may be a degree of superimposed consolidation in the right base. Heart is mildly enlarged with pulmonary vascularity normal. No adenopathy. There is aortic atherosclerosis. No bone lesions. IMPRESSION: Right pleural effusion with atelectasis and probable superimposed consolidation in the right base region. Patchy atelectasis in the left base region is stable.  No new opacity is evident compared to 1 day prior. Stable cardiac prominence. Aortic Atherosclerosis (ICD10-I70.0). Electronically Signed   By: Lowella Grip III M.D.   On: 06/22/2018 08:00   Dg Chest Port 1 View  Result Date: 06/21/2018 CLINICAL DATA:  58 year old female with history of shortness of breath. EXAM: PORTABLE CHEST 1 VIEW COMPARISON:  Chest x-ray 06/20/2018. FINDINGS: Right internal jugular single-lumen porta cath with tip terminating in the distal superior vena cava. Lung volumes are low. Moderate right pleural effusion which appears partially loculated, with fluid extending into the apex of the right hemithorax. Small left pleural effusion. Bibasilar opacities (right greater than left), which may reflect areas of atelectasis and/or consolidation. No evidence of pulmonary edema. Heart size is borderline enlarged. Upper mediastinal contours are within normal limits. IMPRESSION: 1. Support apparatus, as above. 2. Moderate partially loculated right pleural effusion. Small left pleural effusion. These are associated with bibasilar (right greater than left) areas of atelectasis and/or consolidation in the lung bases bilaterally. Electronically Signed   By: Vinnie Langton M.D.   On: 06/21/2018 07:26   Dg Chest Port 1 View  Result Date: 06/20/2018 CLINICAL DATA:  Shortness of breath. EXAM: PORTABLE CHEST 1 VIEW COMPARISON:  Radiograph Jun 19, 2018. FINDINGS:  Stable cardiomegaly. No pneumothorax is noted. Right internal jugular Port-A-Cath is unchanged in position. Stable bilateral lung opacities are noted concerning for edema or atelectasis with associated pleural effusions, right greater than left. Bony thorax is unremarkable. IMPRESSION: Stable bilateral lung opacities concerning for edema or atelectasis with associated pleural effusions. Electronically Signed   By: Marijo Conception M.D.   On: 06/20/2018 08:22   Dg Chest Port 1 View  Result Date: 06/19/2018 CLINICAL DATA:  History of metastatic breast cancer. EXAM: PORTABLE CHEST 1 VIEW COMPARISON:  Jun 17, 2018 FINDINGS: Right Port-A-Cath is stable. Bilateral pleural effusions, right greater than left with underlying atelectasis/consolidation. Cardiomediastinal silhouette is stable. IMPRESSION: No interval change in bilateral pleural effusions with underlying atelectasis/consolidation. Stable right Port-A-Cath. Electronically Signed   By: Dorise Bullion III M.D   On: 06/19/2018 10:10   Dg Chest Port 1 View  Result Date: 06/17/2018 CLINICAL DATA:  58 year old female with respiratory distress EXAM: PORTABLE CHEST 1 VIEW COMPARISON:  06/16/2018, 06/12/2018, CT 06/11/2018 FINDINGS: Cardiomediastinal silhouette likely unchanged in size and contour. Heart borders partially obscured by overlying lung and pleural disease. Opacities at the bilateral lung bases obscuring the hemidiaphragms and heart borders, appears unchanged from prior. Graded opacities towards the bases. Right hilar opacity persists. Partially calcified nodes at the right hilum. No pneumothorax. Right IJ port catheter. No displaced fracture IMPRESSION: Similar appearance of the chest x-ray, with bilateral right greater than left pleural effusions and associated atelectasis/consolidation. Right hilar opacities better seen on recent CT imaging. Unchanged right IJ port catheter. Electronically Signed   By: Corrie Mckusick D.O.   On: 06/17/2018 08:05    Dg Chest Port 1 View  Result Date: 06/16/2018 CLINICAL DATA:  Metastatic breast cancer pneumonia EXAM: PORTABLE CHEST 1 VIEW COMPARISON:  06/12/2018 FINDINGS: Right-sided Port-A-Cath with the tip projecting over the SVC. Small right pleural effusion. Bilateral lower lobe airspace disease most concerning for pneumonia. Persistent right upper lobe pulmonary nodule consistent malignancy. No pneumothorax. Stable cardiomediastinal silhouette. No aggressive osseous lesion. IMPRESSION: 1. Bilateral lower lobe airspace disease most consistent with pneumonia. Small right pleural effusion. 2. Persistent right upper lobe pulmonary nodule consistent with malignancy. Electronically Signed   By: Kathreen Devoid   On: 06/16/2018 08:49  Dg Chest Port 1 View  Result Date: 06/12/2018 CLINICAL DATA:  58 year old female with possible pneumonia in cancer patient. EXAM: PORTABLE CHEST 1 VIEW COMPARISON:  06/11/2018 and earlier. FINDINGS: Portable AP semi upright view at 0943 hours. Stable right chest power port, accessed. Increasing confluence of mid and lower lung opacity. Consolidation demonstrated by CT yesterday. Trace pleural fluid more apparent by CT. No superimposed pneumothorax or pulmonary edema. Stable cardiac size and mediastinal contours. Visualized tracheal air column is within normal limits. IMPRESSION: Mild radiographic progression of bilateral consolidation/pneumonia since yesterday. Electronically Signed   By: Genevie Ann M.D.   On: 06/12/2018 10:37   Dg Chest Port 1 View  Result Date: 06/11/2018 CLINICAL DATA:  Hypotension. EXAM: PORTABLE CHEST 1 VIEW COMPARISON:  Radiograph of May 19, 2018. FINDINGS: Stable cardiomediastinal silhouette. Right internal jugular Port-A-Cath is unchanged in position. No pneumothorax is noted. Increased bibasilar opacities are noted concerning for pneumonia or edema. Small pleural effusions may be present. Bony thorax is unremarkable. IMPRESSION: Increased bibasilar opacities are  noted concerning for worsening pneumonia or edema. Small pleural effusions may be present. Electronically Signed   By: Marijo Conception M.D.   On: 06/11/2018 11:43    All questions were answered. The patient knows to call the clinic with any problems, questions or concerns. No barriers to learning was detected.  I spent 15 minutes counseling the patient face to face. The total time spent in the appointment was 20 minutes and more than 50% was on counseling and review of test results  [name], MD 07/11/2018 9:42 AM

## 2018-07-11 NOTE — Assessment & Plan Note (Signed)
She is scheduled for MRI next week to reassess Clinically, she has no neurological deficits

## 2018-07-17 ENCOUNTER — Other Ambulatory Visit: Payer: Self-pay

## 2018-07-17 ENCOUNTER — Ambulatory Visit
Admission: RE | Admit: 2018-07-17 | Discharge: 2018-07-17 | Disposition: A | Discharge status: Home / Self Care | Payer: BLUE CROSS/BLUE SHIELD | Source: Ambulatory Visit | Attending: Radiation Oncology | Admitting: Radiation Oncology

## 2018-07-17 DIAGNOSIS — C7931 Secondary malignant neoplasm of brain: Secondary | ICD-10-CM

## 2018-07-17 MED ORDER — GADOBENATE DIMEGLUMINE 529 MG/ML IV SOLN
11.0000 mL | Freq: Once | INTRAVENOUS | Status: AC | PRN
  Administered 2018-07-17: 11 mL via INTRAVENOUS

## 2018-07-18 ENCOUNTER — Inpatient Hospital Stay: Payer: BC Managed Care – PPO

## 2018-07-18 ENCOUNTER — Inpatient Hospital Stay (HOSPITAL_BASED_OUTPATIENT_CLINIC_OR_DEPARTMENT_OTHER): Payer: BC Managed Care – PPO | Admitting: Hematology and Oncology

## 2018-07-18 ENCOUNTER — Other Ambulatory Visit: Payer: Self-pay

## 2018-07-18 ENCOUNTER — Encounter: Payer: Self-pay | Admitting: Hematology and Oncology

## 2018-07-18 DIAGNOSIS — C787 Secondary malignant neoplasm of liver and intrahepatic bile duct: Secondary | ICD-10-CM | POA: Diagnosis not present

## 2018-07-18 DIAGNOSIS — Z9221 Personal history of antineoplastic chemotherapy: Secondary | ICD-10-CM

## 2018-07-18 DIAGNOSIS — C7931 Secondary malignant neoplasm of brain: Secondary | ICD-10-CM

## 2018-07-18 DIAGNOSIS — C7951 Secondary malignant neoplasm of bone: Secondary | ICD-10-CM

## 2018-07-18 DIAGNOSIS — D6481 Anemia due to antineoplastic chemotherapy: Secondary | ICD-10-CM

## 2018-07-18 DIAGNOSIS — D61818 Other pancytopenia: Secondary | ICD-10-CM

## 2018-07-18 DIAGNOSIS — C50312 Malignant neoplasm of lower-inner quadrant of left female breast: Secondary | ICD-10-CM

## 2018-07-18 DIAGNOSIS — E440 Moderate protein-calorie malnutrition: Secondary | ICD-10-CM

## 2018-07-18 DIAGNOSIS — I700 Atherosclerosis of aorta: Secondary | ICD-10-CM

## 2018-07-18 DIAGNOSIS — C50919 Malignant neoplasm of unspecified site of unspecified female breast: Secondary | ICD-10-CM

## 2018-07-18 DIAGNOSIS — Z5112 Encounter for antineoplastic immunotherapy: Secondary | ICD-10-CM | POA: Diagnosis not present

## 2018-07-18 DIAGNOSIS — Z794 Long term (current) use of insulin: Secondary | ICD-10-CM

## 2018-07-18 DIAGNOSIS — M7989 Other specified soft tissue disorders: Secondary | ICD-10-CM

## 2018-07-18 DIAGNOSIS — E119 Type 2 diabetes mellitus without complications: Secondary | ICD-10-CM

## 2018-07-18 DIAGNOSIS — Z7951 Long term (current) use of inhaled steroids: Secondary | ICD-10-CM

## 2018-07-18 DIAGNOSIS — Z7984 Long term (current) use of oral hypoglycemic drugs: Secondary | ICD-10-CM

## 2018-07-18 DIAGNOSIS — C7800 Secondary malignant neoplasm of unspecified lung: Secondary | ICD-10-CM

## 2018-07-18 DIAGNOSIS — K219 Gastro-esophageal reflux disease without esophagitis: Secondary | ICD-10-CM

## 2018-07-18 DIAGNOSIS — Z79899 Other long term (current) drug therapy: Secondary | ICD-10-CM

## 2018-07-18 DIAGNOSIS — Z171 Estrogen receptor negative status [ER-]: Secondary | ICD-10-CM

## 2018-07-18 DIAGNOSIS — I10 Essential (primary) hypertension: Secondary | ICD-10-CM

## 2018-07-18 DIAGNOSIS — R5381 Other malaise: Secondary | ICD-10-CM

## 2018-07-18 LAB — CBC WITH DIFFERENTIAL (CANCER CENTER ONLY)
Abs Immature Granulocytes: 1.34 10*3/uL — ABNORMAL HIGH (ref 0.00–0.07)
Basophils Absolute: 0.4 10*3/uL — ABNORMAL HIGH (ref 0.0–0.1)
Basophils Relative: 4 %
Eosinophils Absolute: 0.4 10*3/uL (ref 0.0–0.5)
Eosinophils Relative: 4 %
HCT: 24.8 % — ABNORMAL LOW (ref 36.0–46.0)
Hemoglobin: 7.8 g/dL — ABNORMAL LOW (ref 12.0–15.0)
Immature Granulocytes: 13 %
Lymphocytes Relative: 27 %
Lymphs Abs: 2.7 10*3/uL (ref 0.7–4.0)
MCH: 27.7 pg (ref 26.0–34.0)
MCHC: 31.5 g/dL (ref 30.0–36.0)
MCV: 87.9 fL (ref 80.0–100.0)
Monocytes Absolute: 1.2 10*3/uL — ABNORMAL HIGH (ref 0.1–1.0)
Monocytes Relative: 12 %
Neutro Abs: 4 10*3/uL (ref 1.7–7.7)
Neutrophils Relative %: 40 %
Platelet Count: 80 10*3/uL — ABNORMAL LOW (ref 150–400)
RBC: 2.82 MIL/uL — ABNORMAL LOW (ref 3.87–5.11)
RDW: 16.5 % — ABNORMAL HIGH (ref 11.5–15.5)
WBC Count: 10 10*3/uL (ref 4.0–10.5)
nRBC: 1.9 % — ABNORMAL HIGH (ref 0.0–0.2)

## 2018-07-18 LAB — CMP (CANCER CENTER ONLY)
ALT: 11 U/L (ref 0–44)
AST: 15 U/L (ref 15–41)
Albumin: 2.5 g/dL — ABNORMAL LOW (ref 3.5–5.0)
Alkaline Phosphatase: 137 U/L — ABNORMAL HIGH (ref 38–126)
Anion gap: 10 (ref 5–15)
BUN: 15 mg/dL (ref 6–20)
CO2: 28 mmol/L (ref 22–32)
Calcium: 9.3 mg/dL (ref 8.9–10.3)
Chloride: 99 mmol/L (ref 98–111)
Creatinine: 0.63 mg/dL (ref 0.44–1.00)
GFR, Est AFR Am: >60 mL/min (ref >60)
GFR, Estimated: >60 mL/min (ref >60)
Glucose, Bld: 58 mg/dL — ABNORMAL LOW (ref 70–99)
Potassium: 4.4 mmol/L (ref 3.5–5.1)
Sodium: 137 mmol/L (ref 135–145)
Total Bilirubin: 0.5 mg/dL (ref 0.3–1.2)
Total Protein: 7.7 g/dL (ref 6.5–8.1)

## 2018-07-18 LAB — SAMPLE TO BLOOD BANK

## 2018-07-18 LAB — PREPARE RBC (CROSSMATCH)

## 2018-07-18 MED ORDER — HEPARIN SOD (PORK) LOCK FLUSH 100 UNIT/ML IV SOLN
500.0000 [IU] | Freq: Every day | INTRAVENOUS | Status: AC | PRN
  Administered 2018-07-18: 500 [IU]
  Filled 2018-07-18: qty 5

## 2018-07-18 MED ORDER — ACETAMINOPHEN 325 MG PO TABS
650.0000 mg | ORAL_TABLET | Freq: Once | ORAL | Status: AC
  Administered 2018-07-18: 09:00:00 650 mg via ORAL

## 2018-07-18 MED ORDER — SODIUM CHLORIDE 0.9% IV SOLUTION
250.0000 mL | Freq: Once | INTRAVENOUS | Status: AC
  Administered 2018-07-18: 09:00:00 250 mL via INTRAVENOUS
  Filled 2018-07-18: qty 250

## 2018-07-18 MED ORDER — ACETAMINOPHEN 325 MG PO TABS
ORAL_TABLET | ORAL | Status: AC
  Filled 2018-07-18: qty 2

## 2018-07-18 MED ORDER — DIPHENHYDRAMINE HCL 25 MG PO CAPS
ORAL_CAPSULE | ORAL | Status: AC
  Filled 2018-07-18: qty 1

## 2018-07-18 MED ORDER — SODIUM CHLORIDE 0.9% FLUSH
3.0000 mL | INTRAVENOUS | Status: AC | PRN
  Administered 2018-07-18: 10 mL
  Filled 2018-07-18: qty 10

## 2018-07-18 MED ORDER — DIPHENHYDRAMINE HCL 25 MG PO CAPS
25.0000 mg | ORAL_CAPSULE | Freq: Once | ORAL | Status: AC
  Administered 2018-07-18: 25 mg via ORAL

## 2018-07-18 NOTE — Assessment & Plan Note (Signed)
Her pancytopenia is slowly improving Her platelet count has doubled She is still anemic I recommend 1 unit of blood transfusion We discussed some of the risks, benefits, and alternatives of blood transfusions. The patient is symptomatic from anemia and the hemoglobin level is critically low.  Some of the side-effects to be expected including risks of transfusion reactions, chills, infection, syndrome of volume overload and risk of hospitalization from various reasons and the patient is willing to proceed and went ahead to sign consent today.

## 2018-07-18 NOTE — Patient Instructions (Signed)

## 2018-07-18 NOTE — Progress Notes (Signed)
[place] OFFICE PROGRESS NOTE  Patient Care Team: Robyne Peers, MD as PCP - General (Family Medicine) Elouise Munroe, MD as PCP - Cardiology (Cardiology)  ASSESSMENT & PLAN:  Metastatic breast cancer ([place]) Overall, she continues to improve since last time I saw her I will continue to provide transfusion support today I plan to restart her chemotherapy on the week of June 29 I plan to reduce the dose of Taxol to a third of the dose We will monitor her closely after chemotherapy on a weekly basis to avoid admissions to the hospital  Metastasis to brain [facility]) Her MRI report from yesterday showed no evidence of new progression of intracranial metastasis Clinically, she is not symptomatic She has appointment to follow with radiation oncologist and her case will be reviewed at the tumor board next week I will defer to radiation oncology for further management  Pancytopenia, acquired [facility]) Her pancytopenia is slowly improving Her platelet count has doubled She is still anemic I recommend 1 unit of blood transfusion We discussed some of the risks, benefits, and alternatives of blood transfusions. The patient is symptomatic from anemia and the hemoglobin level is critically low.  Some of the side-effects to be expected including risks of transfusion reactions, chills, infection, syndrome of volume overload and risk of hospitalization from various reasons and the patient is willing to proceed and went ahead to sign consent today.   Malnutrition of moderate degree Her legs are swollen and likely due to protein calorie malnutrition Overall, her oral intake has improved I continue to encourage her to increase intake as tolerated  Physical debility She is still quite debilitated requiring at least 1 person for assistance to get up and about She continues to work with home physical therapist twice a week I think by the end of next week, her performance status will  improve to the point that she can be treated with chemotherapy again I will try to get her scheduled for treatment to start the week of June 29   Orders Placed This Encounter  Procedures  . Practitioner attestation of consent    I, the ordering practitioner, attest that I have discussed with the patient the benefits, risks, side effects, alternatives, likelihood of achieving goals and potential problems during recovery for the procedure listed.    Standing Status:   Future    Standing Expiration Date:   07/18/2019    Order Specific Question:   Procedure    Answer:   Blood Product(s)  . Complete patient signature process for consent form    Standing Status:   Future    Standing Expiration Date:   07/18/2019  . Care order/instruction    Transfuse Parameters    Standing Status:   Future    Standing Expiration Date:   07/18/2019  . Type and screen    Standing Status:   Future    Number of Occurrences:   1    Standing Expiration Date:   07/18/2019    INTERVAL HISTORY: Please see below for problem oriented charting. She returns for further evaluation and transfusion support Since last time I saw her, she is improving She has minimum pain She has minimum cough or shortness of breath She is weaning her oxygen off She continues home therapy twice a week and is getting stronger She is able to walk short distances with a walker No recent falls She continues to bruise easily but no frank bleeding No recent fever or chills Her  appetite is improving and her blood sugar control at home is satisfactory  SUMMARY OF ONCOLOGIC HISTORY: Oncology History Overview Note  Biopsy is ER negative Her2/neu positive   Metastatic breast cancer ([place])  03/11/2018 Imaging   Ct scan of chest, abdomen and pelvis 1. 2.6 x 2.3 cm spiculated mass identified inferomedial quadrant of the left breast. Lesion appears to retract the overlying skin. Mammographic correlation recommended. 2. Small lymph nodes in the  left axillar ill-defined and suspicious for metastatic disease. 3. Multiple bilateral pulmonary nodules measuring up to 2.7 cm diameter. These probably represent metastatic disease. Given the dominant size of the central right upper lobe lesion, synchronous lung primary can not be completely excluded. 4. Multiple ill-defined liver lesions compatible with metastatic disease. Dominant liver metastases measure up to almost 4 cm. 5.  Aortic Atherosclerois (ICD10-170.0)    03/12/2018 Pathology Results   Liver, needle/core biopsy, left lobe - METASTATIC CARCINOMA. - LYMPHOVASCULAR INVASION IS IDENTIFIED. - SEE COMMENT. Microscopic Comment The tumor cells are positive for cytokeratin 7. There is faint staining for GATA-3. There is non-specific staining for TTF-1. Cytokeratin 20, CDX-2, estrogen receptor, and GCDFP stains are negative. Given the clinical suspicion, the profile supports a primary breast carcinoma. Her2 will be performed and the results reported separately.  By immunohistochemistry, the tumor cells are POSITIVE for Her2 (3+).   03/12/2018 Imaging   Single 1.3 x 1.4 cm RIGHT occipital metastasis.  Borderline pachymeningeal enhancement of symmetric nature could be related to the superficial metastasis or osseous disease.   03/12/2018 Procedure   Ultrasound-guided core biopsy performed of a mass within the lateral segment of the left lobe of the liver.   03/16/2018 Imaging   Right occipital lobe 13 x 16 mm. Small satellite enhancing nodule deep to the larger lesion. The larger lesion shows central necrosis and probable mild hemorrhage or calcification.  Mild dural enhancement is less impressive and could be within normal limits.  Bone marrow in the clivus and cervical spine is diffusely low signal. No focal lesion. This may be related to the patient's anemia and abnormal blood count.   03/17/2018 Cancer Staging   Staging form: Breast, AJCC 8th Edition - Clinical: Stage IV (cT2,  cN0, pM1, GX, ER-, PR: Not Assessed, HER2+) - Signed by [name], MD on 03/17/2018   03/19/2018 -  Chemotherapy   She received chemo with Taxotere, Herceptin and Perjeta   03/21/2018 Echocardiogram   IMPRESSIONS 1. The left ventricle has normal systolic function with an ejection fraction of 60-65%. The cavity size was normal. Left ventricular diastolic Doppler parameters are consistent with impaired relaxation.  2. The right ventricle has normal systolic function. The cavity was normal. There is no increase in right ventricular wall thickness.  3. The mitral valve is normal in structure.  4. The tricuspid valve is normal in structure.  5. Strain imaging performed but not reported due to interpreter judgement, secondary to suboptimal image quality.   03/24/2018 Procedure   Successful placement of a right IJ approach Power Port with ultrasound and fluoroscopic guidance. The catheter is ready for use.   04/07/2018 - 04/16/2018 Hospital Admission   She was admitted for pneumonia   04/07/2018 - 04/16/2018 Hospital Admission   She was admitted to the hospital for pneumonia and respiratory failure   04/25/2018 Imaging   Retropharyngeal fluid collection compatible with effusion or possibly abscess. Soft tissue thickening extends into the right neck and surrounds the right carotid bifurcation and right internal carotid artery. There is  also streaky density in the right lateral neck soft tissues with a focal 7.6 Mm lymph node in the right lateral neck. Stranding surrounds the right submandibular gland.   Findings are most compatible with infection. Favor pharyngitis with retropharyngeal effusion/abscess. Soft tissue thickening around the right carotid most compatible with infection rather than tumor. Right jugular vein is patent.  Multiple nodular densities in the right upper lobe may represent residual pneumonia or metastatic disease. Interval improvement in right upper lobe infiltrate compared with CT  of 04/08/2018   04/25/2018 - 05/06/2018 Hospital Admission   She was admitted for retropharyngeal abscess   04/27/2018 Imaging   No evidence of large central pulmonary embolus is noted in the main pulmonary artery or the main portions of the right and left pulmonary arteries. However, evaluation of the lower lobe branches, particularly on the right, is limited due to respiratory motion artifact and other limiting issues. Pulmonary emboli and smaller peripheral branches can not be excluded on the basis of this exam.  Large left pleural effusion is noted with complete atelectasis of the left lower lobe. Increased left upper lobe opacity is noted posteriorly concerning for atelectasis or pneumonia.  Improved right upper lobe and lower lobe opacities are noted suggesting improving pneumonia or atelectasis.  15 mm right paratracheal lymph node is noted which is enlarged compared to prior exam; it is uncertain if this is metastatic or inflammatory in etiology.  Hepatic metastatic lesions are again noted.  Aortic Atherosclerosis (ICD10-I70.0).    04/27/2018 Procedure   Successful ultrasound guided left thoracentesis yielding 120 mL of blood-tinged of pleural fluid.   06/11/2018 Imaging   1. No evidence of pulmonary emboli. 2. Extensive bilateral lower lobe consolidation. 3. Small pleural effusions. 4. 2.5 cm right upper lobe nodule, enlarged from 04/27/2018 and which may reflect a metastasis or primary lung cancer. Stable to mild enlargement of subcentimeter nodules in both upper lobes consistent with known metastases. 5. Decreased size of liver metastases. 6. Unchanged mild mediastinal and right hilar lymphadenopathy. 7. Aortic Atherosclerosis (ICD10-I70.0).   06/11/2018 - 06/22/2018 Hospital Admission   She was hospitalized for infection and SVT   [facility]ast[facility]  03/17/2018 Initial Diagnosis   Metastasis to brain [facility])   Metastasis to liver ([place])  03/17/2018 Initial  Diagnosis   Metastasis to liver [facility])   Metastasis to bone ([place])  03/17/2018 Initial Diagnosis   Metastasis to bone ([facility])     REVIEW OF SYSTEMS:   Constitutional: Denies fevers, chills or abnormal weight loss Eyes: Denies blurriness of vision Ears, nose, mouth, throat, and face: Denies mucositis or sore throat Respiratory: Denies cough, dyspnea or wheezes Cardiovascular: Denies palpitation, chest discomfort Gastrointestinal:  Denies nausea, heartburn or change in bowel habits Skin: Denies abnormal skin rashes Lymphatics: Denies new lymphadenopathy or easy bruising Behavioral/Psych: Mood is stable, no new changes  All other systems were reviewed with the patient and are negative.  I have reviewed the past medical history, past surgical history, social history and family history with the patient and they are unchanged from previous note.  ALLERGIES:  is allergic to nsaids.  MEDICATIONS:  Current Outpatient Medications  Medication Sig Dispense Refill  . ACCU-CHEK AVIVA PLUS test strip     . ACCU-CHEK SOFTCLIX LANCETS lancets     . acetaminophen (TYLENOL) 325 MG tablet Take 2 tablets (650 mg total) by mouth every 6 (six) hours as needed for mild pain (or Fever >/= 101). 30 tablet 0  . albuterol (PROAIR HFA) 108 (  90 Base) MCG/ACT inhaler Inhale 2 puffs into the lungs every 6 (six) hours as needed for wheezing or shortness of breath. 1 Inhaler 1  . amiodarone (PACERONE) 200 MG tablet Take 1 tablet (200 mg total) by mouth daily. 30 tablet 0  . benzonatate (TESSALON) 200 MG capsule Take 1 capsule (200 mg total) by mouth 3 (three) times daily as needed for cough (use second). 20 capsule 0  . blood glucose meter kit and supplies KIT Dispense based on patient and insurance preference. Use up to four times daily as directed. (FOR ICD-9 250.00, 250.01). 1 each 0  . esomeprazole (NEXIUM) 40 MG capsule Take 1 capsule (40 mg total) by mouth daily at 12 noon. 90 capsule 3  . feeding supplement,  ENSURE ENLIVE, (ENSURE ENLIVE) LIQD Take 237 mLs by mouth 3 (three) times daily between meals. 237 mL 12  . fluticasone (FLOVENT HFA) 44 MCG/ACT inhaler Inhale 2 puffs into the lungs 2 (two) times daily. 3 Inhaler 1  . insulin aspart (NOVOLOG) 100 UNIT/ML FlexPen Inject 8 Units into the skin 3 (three) times daily with meals. (Patient taking differently: Inject 8 Units into the skin 3 (three) times daily with meals. Holds dose if not going to eat.) 15 mL 11  . Insulin Detemir (LEVEMIR) 100 UNIT/ML Pen Inject 10 Units into the skin daily. (Patient taking differently: Inject 10 Units into the skin at bedtime. ) 15 mL 0  . Insulin Pen Needle 31G X 5 MM MISC 1 Device by Does not apply route QID. For use with insulin pens 100 each 0  . lidocaine-prilocaine (EMLA) cream Apply to affected area once (Patient taking differently: Apply 1 application topically as needed (port access). Apply to affected area once) 30 g 3  . LORazepam (ATIVAN) 0.5 MG tablet Take 1 tab po 30 minutes prior to radiation or MRI (Patient taking differently: Take 0.5 mg by mouth See admin instructions. Take 30 minutes prior to radiation or MRI) 30 tablet 0  . metFORMIN (GLUCOPHAGE) 500 MG tablet Take 500 mg by mouth daily with breakfast.     . metoprolol tartrate (LOPRESSOR) 25 MG tablet Take 0.5 tablets (12.5 mg total) by mouth 2 (two) times daily. 60 tablet 0  . mirtazapine (REMERON) 15 MG tablet Take 1 tablet (15 mg total) by mouth at bedtime. 30 tablet 0  . montelukast (SINGULAIR) 10 MG tablet Take 1 tablet (10 mg total) by mouth daily. 30 tablet 1  . morphine (MSIR) 15 MG tablet Take 1 tablet (15 mg total) by mouth every 4 (four) hours as needed for severe pain. 60 tablet 0  . Multiple Vitamin (MULTIVITAMIN WITH MINERALS) TABS tablet Take 1 tablet by mouth daily.    . ondansetron (ZOFRAN) 8 MG tablet Take 8 mg by mouth every 8 (eight) hours as needed for nausea or vomiting.     . prochlorperazine (COMPAZINE) 10 MG tablet Take 10 mg  by mouth every 6 (six) hours as needed for nausea or vomiting.      No current facility-administered medications for this visit.    Facility-Administered Medications Ordered in Other Visits  Medication Dose Route Frequency Provider Last Rate Last Dose  . heparin lock flush 100 unit/mL  250 Units Intracatheter PRN Shreena Baines, MD      . heparin lock flush 100 unit/mL  500 Units Intracatheter Daily PRN Kacy Conely, MD      . sodium chloride flush (NS) 0.9 % injection 3 mL  3 mL Intracatheter PRN Esau Fridman,  MD      . sodium chloride flush (NS) 0.9 % injection 3 mL  3 mL Intracatheter PRN Alvy Bimler, Tona Qualley, MD        PHYSICAL EXAMINATION: ECOG PERFORMANCE STATUS: 2 - Symptomatic, <50% confined to bed GENERAL:alert, no distress and comfortable SKIN: skin color, texture, turgor are normal, no rashes or significant lesions EYES: Noted skin bruises OROPHARYNX:no exudate, no erythema and lips, buccal mucosa, and tongue normal  NECK: supple, thyroid normal size, non-tender, without nodularity LYMPH:  no palpable lymphadenopathy in the cervical, axillary or inguinal LUNGS: clear to auscultation and percussion with normal breathing effort HEART: regular rate & rhythm and no murmurs with moderate bilateral lower extremity edema ABDOMEN:abdomen soft, non-tender and normal bowel sounds Musculoskeletal:no cyanosis of digits and no clubbing  NEURO: alert & oriented x 3 with fluent speech, no focal motor/sensory deficits  LABORATORY DATA:  I have reviewed the data as listed    Component Value Date/Time   NA 137 07/18/2018 0806   K 4.4 07/18/2018 0806   CL 99 07/18/2018 0806   CO2 28 07/18/2018 0806   GLUCOSE 58 (L) 07/18/2018 0806   BUN 15 07/18/2018 0806   CREATININE 0.63 07/18/2018 0806   CALCIUM 9.3 07/18/2018 0806   PROT 7.7 07/18/2018 0806   ALBUMIN 2.5 (L) 07/18/2018 0806   AST 15 07/18/2018 0806   ALT 11 07/18/2018 0806   ALKPHOS 137 (H) 07/18/2018 0806   BILITOT 0.5 07/18/2018 0806    GFRNONAA >60 07/18/2018 0806   GFRAA >60 07/18/2018 0806    No results found for: SPEP, UPEP  Lab Results  Component Value Date   WBC 10.0 07/18/2018   NEUTROABS 4.0 07/18/2018   HGB 7.8 (L) 07/18/2018   HCT 24.8 (L) 07/18/2018   MCV 87.9 07/18/2018   PLT 80 (L) 07/18/2018      Chemistry      Component Value Date/Time   NA 137 07/18/2018 0806   K 4.4 07/18/2018 0806   CL 99 07/18/2018 0806   CO2 28 07/18/2018 0806   BUN 15 07/18/2018 0806   CREATININE 0.63 07/18/2018 0806      Component Value Date/Time   CALCIUM 9.3 07/18/2018 0806   ALKPHOS 137 (H) 07/18/2018 0806   AST 15 07/18/2018 0806   ALT 11 07/18/2018 0806   BILITOT 0.5 07/18/2018 0806       RADIOGRAPHIC STUDIES: I have personally reviewed the radiological images as listed and agreed with the findings in the report. Mr Jeri Cos Wo Contrast  Result Date: 07/18/2018 CLINICAL DATA:  Breast cancer. SRS restaging of solitary right occipital metastasis. EXAM: MRI HEAD WITHOUT AND WITH CONTRAST TECHNIQUE: Multiplanar, multiecho pulse sequences of the brain and surrounding structures were obtained without and with intravenous contrast. CONTRAST:  43m MULTIHANCE GADOBENATE DIMEGLUMINE 529 MG/ML IV SOLN COMPARISON:  03/16/2018 FINDINGS: BRAIN New Lesions: None. Larger lesions: None. Stable or Smaller lesions: 8 mm mm enhancing lesion located right occipital lobe and seen on 11:75. Associated vasogenic edema is also significantly diminished. Other Brain findings: No acute infarct, hemorrhage, hydrocephalus, or collection. Chronic blood products associated with the treated metastasis. Vascular: Major flow voids are preserved Skull and upper cervical spine: New ill-defined enhancement at the level of the atlanto occipital membrane, where there was previously thin ligament and normal fat. Thickening measures up to 12 mm on sagittal postcontrast imaging. The adjacent posterior ring of C1 marrow is abnormally hypointense, but not  enhancing in similar fashion. Marrow is diffusely low signal. Sinuses/Orbits:  Paranasal sinus surgery.  No acute sinusitis. IMPRESSION: 1. Regression of solitary right occipital lobe metastasis and edema. No new brain metastasis. 2. New ill-defined enhancement at the atlantooccipital membrane, presumably a metastatic focus. The adjacent bone marrow is abnormally low signal but not enhancing to the degree of the extraosseous lesion. Marrow signal is likely from anemia. Electronically Signed   By: Monte [name] M.D.   On: 07/18/2018 07:10   Dg Chest Port 1 View  Result Date: 06/22/2018 CLINICAL DATA:  Shortness of breath EXAM: PORTABLE CHEST 1 VIEW COMPARISON:  Jun 21, 2018 FINDINGS: Port-A-Cath tip is in the superior vena cava of the cavoatrial junction. No pneumothorax. Right pleural effusion which may be partially loculated persists. There is patchy bibasilar atelectasis. There may be a degree of superimposed consolidation in the right base. Heart is mildly enlarged with pulmonary vascularity normal. No adenopathy. There is aortic atherosclerosis. No bone lesions. IMPRESSION: Right pleural effusion with atelectasis and probable superimposed consolidation in the right base region. Patchy atelectasis in the left base region is stable. No new opacity is evident compared to 1 day prior. Stable cardiac prominence. Aortic Atherosclerosis (ICD10-I70.0). Electronically Signed   By: Lowella Grip III M.D.   On: 06/22/2018 08:00   Dg Chest Port 1 View  Result Date: 06/21/2018 CLINICAL DATA:  58 year old female with history of shortness of breath. EXAM: PORTABLE CHEST 1 VIEW COMPARISON:  Chest x-ray 06/20/2018. FINDINGS: Right internal jugular single-lumen porta cath with tip terminating in the distal superior vena cava. Lung volumes are low. Moderate right pleural effusion which appears partially loculated, with fluid extending into the apex of the right hemithorax. Small left pleural effusion. Bibasilar  opacities (right greater than left), which may reflect areas of atelectasis and/or consolidation. No evidence of pulmonary edema. Heart size is borderline enlarged. Upper mediastinal contours are within normal limits. IMPRESSION: 1. Support apparatus, as above. 2. Moderate partially loculated right pleural effusion. Small left pleural effusion. These are associated with bibasilar (right greater than left) areas of atelectasis and/or consolidation in the lung bases bilaterally. Electronically Signed   By: Vinnie Langton M.D.   On: 06/21/2018 07:26   Dg Chest Port 1 View  Result Date: 06/20/2018 CLINICAL DATA:  Shortness of breath. EXAM: PORTABLE CHEST 1 VIEW COMPARISON:  Radiograph Jun 19, 2018. FINDINGS: Stable cardiomegaly. No pneumothorax is noted. Right internal jugular Port-A-Cath is unchanged in position. Stable bilateral lung opacities are noted concerning for edema or atelectasis with associated pleural effusions, right greater than left. Bony thorax is unremarkable. IMPRESSION: Stable bilateral lung opacities concerning for edema or atelectasis with associated pleural effusions. Electronically Signed   By: Marijo Conception M.D.   On: 06/20/2018 08:22   Dg Chest Port 1 View  Result Date: 06/19/2018 CLINICAL DATA:  History of metastatic breast cancer. EXAM: PORTABLE CHEST 1 VIEW COMPARISON:  Jun 17, 2018 FINDINGS: Right Port-A-Cath is stable. Bilateral pleural effusions, right greater than left with underlying atelectasis/consolidation. Cardiomediastinal silhouette is stable. IMPRESSION: No interval change in bilateral pleural effusions with underlying atelectasis/consolidation. Stable right Port-A-Cath. Electronically Signed   By: Dorise Bullion III M.D   On: 06/19/2018 10:10    All questions were answered. The patient knows to call the clinic with any problems, questions or concerns. No barriers to learning was detected.  I spent 25 minutes counseling the patient face to face. The total time  spent in the appointment was 40 minutes and more than 50% was on counseling and review of test results  [name], MD 07/18/2018 9:37 AM

## 2018-07-18 NOTE — Assessment & Plan Note (Signed)
Overall, she continues to improve since last time I saw her I will continue to provide transfusion support today I plan to restart her chemotherapy on the week of June 29 I plan to reduce the dose of Taxol to a third of the dose We will monitor her closely after chemotherapy on a weekly basis to avoid admissions to the hospital

## 2018-07-18 NOTE — Assessment & Plan Note (Signed)
Her legs are swollen and likely due to protein calorie malnutrition Overall, her oral intake has improved I continue to encourage her to increase intake as tolerated

## 2018-07-18 NOTE — Assessment & Plan Note (Signed)
Her MRI report from yesterday showed no evidence of new progression of intracranial metastasis Clinically, she is not symptomatic She has appointment to follow with radiation oncologist and her case will be reviewed at the tumor board next week I will defer to radiation oncology for further management

## 2018-07-18 NOTE — Assessment & Plan Note (Signed)
She is still quite debilitated requiring at least 1 person for assistance to get up and about She continues to work with home physical therapist twice a week I think by the end of next week, her performance status will improve to the point that she can be treated with chemotherapy again I will try to get her scheduled for treatment to start the week of June 29

## 2018-07-19 LAB — TYPE AND SCREEN
ABO/RH(D): A POS
Antibody Screen: NEGATIVE
Unit division: 0

## 2018-07-19 LAB — BPAM RBC
Blood Product Expiration Date: 202007102359
ISSUE DATE / TIME: 202006191016
Unit Type and Rh: 6200

## 2018-07-21 ENCOUNTER — Telehealth: Payer: Self-pay | Admitting: Hematology and Oncology

## 2018-07-21 ENCOUNTER — Other Ambulatory Visit: Payer: Self-pay | Admitting: Hematology and Oncology

## 2018-07-21 ENCOUNTER — Other Ambulatory Visit: Payer: Self-pay

## 2018-07-21 ENCOUNTER — Telehealth: Payer: Self-pay

## 2018-07-21 ENCOUNTER — Ambulatory Visit
Admission: RE | Admit: 2018-07-21 | Discharge: 2018-07-21 | Disposition: A | Discharge status: Home / Self Care | Payer: BC Managed Care – PPO | Source: Ambulatory Visit | Attending: Radiation Oncology | Admitting: Radiation Oncology

## 2018-07-21 DIAGNOSIS — C50919 Malignant neoplasm of unspecified site of unspecified female breast: Secondary | ICD-10-CM

## 2018-07-21 DIAGNOSIS — C7951 Secondary malignant neoplasm of bone: Secondary | ICD-10-CM

## 2018-07-21 MED ORDER — AMIODARONE HCL 200 MG PO TABS: 200.0000 mg | ORAL_TABLET | Freq: Every day | ORAL | 0 refills | Status: DC

## 2018-07-21 MED ORDER — MORPHINE SULFATE 15 MG PO TABS: 15.0000 mg | ORAL_TABLET | ORAL | 0 refills | Status: DC | PRN

## 2018-07-21 NOTE — Telephone Encounter (Signed)
-----   Message from [name], MD sent at 07/21/2018  9:53 AM EDT ----- Regarding: RE: prescriptions She is bedbound and probably cannot attend appt to see a cardiologist I have sent refill of both to her pharmacy I have no suggestions to add for her shoulder and neck pain except to take tylenol or morphine as needed for now ----- Message ----- From: Paulla Dolly, RN Sent: 07/21/2018   9:43 AM EDT To: [name], MD Subject: prescriptions                                  Called for refills on morphine and amiodarone.  Doesn't look like you are the one who last filled the amiodarone.  I mentioned that to her and she said you were managing all her prescriptions for simplicity.  Is that correct? Also, she is c/o increased pain in neck and shoulders that started on Sat.  She notes this was after blood transfusion and has happened after blood transfusion in the past.  No fevers / chills, SHOB, urine discoloration.

## 2018-07-21 NOTE — Telephone Encounter (Signed)
Pt informed prescriptions sent to pharmacy. Instructed her to alternate Tylenol with morphine for pain if needed. RN advised pt to call office if pain gets worse or any other symptoms appear. Pt verbalizes understanding.

## 2018-07-21 NOTE — Telephone Encounter (Signed)
Spoke with pt by phone and gave below msg. Pt verbalizes understanding.

## 2018-07-21 NOTE — Telephone Encounter (Signed)
-----   Message from [name], MD sent at 07/21/2018 11:57 AM EDT ----- We discussed that a while back Since I am planning significant dose reduction on Taxol and she had severe hyperglycemia with last chemo, she does not need to take dex ----- Message ----- From: Paulla Dolly, RN Sent: 07/21/2018  11:28 AM EDT To: [name], MD  She called and wants to know if she is supposed to take steroids before her next treatment. I do not see steroids on her med profile but she says she has them

## 2018-07-21 NOTE — Progress Notes (Signed)
Radiation Oncology         [phone] ________________________________  Initial Outpatient Consultation - Conducted via telephone due to current COVID-19 concerns for limiting patient exposure  I spoke with the patient to conduct this consult visit via telephone to spare the patient unnecessary potential exposure in the healthcare setting during the current COVID-19 pandemic. The patient was notified in advance and was offered a [place] meeting to allow for face to face communication but unfortunately reported that they did not have the appropriate resources/technology to support such a visit and instead preferred to proceed with a telephone consult.   Name: Stacy Shelton        MRN: [id number]  Date of Service: 07/21/2018 DOB: [birth year]  EP:PIRJJO, Stacy Musty, MD  Robyne Peers, MD     REFERRING PHYSICIAN: Robyne Peers, MD   DIAGNOSIS: The encounter diagnosis was Metastatic breast cancer [facility]).   HISTORY OF PRESENT ILLNESS: Stacy Shelton is a 58 y.o. female with a history of stage IV breast cancer.  The patient had been in her usual state of health however been had been experiencing increasing coughing, weight loss, and thrombocytopenia which prompted further evaluation in the ED on 03/10/2018. CT imaging in the hospital setting reveals concerns for metastatic pulmonary disease as well as additional imaging that is been collected revealing liver disease as well.  The patient has undergone biopsy of her liver lesion was consistent with metastatic breast cancer.  For more complete staging she did undergo an MRI scan of the brain on 03/12/2018 this revealed a solitary 13 x 14 x 13 mm right lateral occipital lobe metastasis with mild surrounding edema.  No midline shift or other metastatic lesions were identified.  Given the findings, she received stereotactic radiosurgery on 03/21/2018. She has been on chemotherapy but has had multiple delays due to pancytopenia. She had a restaging MRI of the  brain on 07/17/2018 that revealed no new brain lesions, and regression of her treated lesion. There was a possible new calvarial lesion.    PREVIOUS RADIATION THERAPY: Yes   03/21/2018 SRS Treatment: PTV1: Right Occipital 67m // 20 Gy in 1 fraction   PAST MEDICAL HISTORY:  Past Medical History:  Diagnosis Date  . Asthma   . Diabetes ([place]   . Metastatic breast cancer ([facility]   . Seasonal allergies        PAST SURGICAL HISTORY: Past Surgical History:  Procedure Laterality Date  . IR IMAGING GUIDED PORT INSERTION  03/24/2018  . KIDNEY STONE SURGERY    . SINOSCOPY    . WISDOM TOOTH EXTRACTION       FAMILY HISTORY:  Family History  Problem Relation Age of Onset  . Allergic rhinitis Neg Hx   . Angioedema Neg Hx   . Asthma Neg Hx   . Atopy Neg Hx   . Eczema Neg Hx   . Immunodeficiency Neg Hx   . Urticaria Neg Hx      SOCIAL HISTORY:  reports that she has never smoked. She has never used smokeless tobacco. She reports current alcohol use. She reports that she does not use drugs. The patient is single and lives in [place] She works for [employer] Her mother lives with her as well.   ALLERGIES: Nsaids   MEDICATIONS:  Current Outpatient Medications  Medication Sig Dispense Refill  . ACCU-CHEK AVIVA PLUS test strip     . ACCU-CHEK SOFTCLIX LANCETS lancets     . acetaminophen (TYLENOL) 325 MG tablet  Take 2 tablets (650 mg total) by mouth every 6 (six) hours as needed for mild pain (or Fever >/= 101). 30 tablet 0  . albuterol (PROAIR HFA) 108 (90 Base) MCG/ACT inhaler Inhale 2 puffs into the lungs every 6 (six) hours as needed for wheezing or shortness of breath. 1 Inhaler 1  . amiodarone (PACERONE) 200 MG tablet Take 1 tablet (200 mg total) by mouth daily. 30 tablet 0  . benzonatate (TESSALON) 200 MG capsule Take 1 capsule (200 mg total) by mouth 3 (three) times daily as needed for cough (use second). 20 capsule 0  . blood glucose meter kit and supplies KIT Dispense based  on patient and insurance preference. Use up to four times daily as directed. (FOR ICD-9 250.00, 250.01). 1 each 0  . esomeprazole (NEXIUM) 40 MG capsule Take 1 capsule (40 mg total) by mouth daily at 12 noon. 90 capsule 3  . feeding supplement, ENSURE ENLIVE, (ENSURE ENLIVE) LIQD Take 237 mLs by mouth 3 (three) times daily between meals. 237 mL 12  . fluticasone (FLOVENT HFA) 44 MCG/ACT inhaler Inhale 2 puffs into the lungs 2 (two) times daily. 3 Inhaler 1  . insulin aspart (NOVOLOG) 100 UNIT/ML FlexPen Inject 8 Units into the skin 3 (three) times daily with meals. (Patient taking differently: Inject 8 Units into the skin 3 (three) times daily with meals. Holds dose if not going to eat.) 15 mL 11  . Insulin Detemir (LEVEMIR) 100 UNIT/ML Pen Inject 10 Units into the skin daily. (Patient taking differently: Inject 10 Units into the skin at bedtime. ) 15 mL 0  . Insulin Pen Needle 31G X 5 MM MISC 1 Device by Does not apply route QID. For use with insulin pens 100 each 0  . lidocaine-prilocaine (EMLA) cream Apply to affected area once (Patient taking differently: Apply 1 application topically as needed (port access). Apply to affected area once) 30 g 3  . LORazepam (ATIVAN) 0.5 MG tablet Take 1 tab po 30 minutes prior to radiation or MRI (Patient taking differently: Take 0.5 mg by mouth See admin instructions. Take 30 minutes prior to radiation or MRI) 30 tablet 0  . metFORMIN (GLUCOPHAGE) 500 MG tablet Take 500 mg by mouth daily with breakfast.     . metoprolol tartrate (LOPRESSOR) 25 MG tablet Take 0.5 tablets (12.5 mg total) by mouth 2 (two) times daily. 60 tablet 0  . mirtazapine (REMERON) 15 MG tablet Take 1 tablet (15 mg total) by mouth at bedtime. 30 tablet 0  . montelukast (SINGULAIR) 10 MG tablet Take 1 tablet (10 mg total) by mouth daily. 30 tablet 1  . morphine (MSIR) 15 MG tablet Take 1 tablet (15 mg total) by mouth every 4 (four) hours as needed for severe pain. 60 tablet 0  . Multiple Vitamin  (MULTIVITAMIN WITH MINERALS) TABS tablet Take 1 tablet by mouth daily.    . ondansetron (ZOFRAN) 8 MG tablet Take 8 mg by mouth every 8 (eight) hours as needed for nausea or vomiting.     . prochlorperazine (COMPAZINE) 10 MG tablet Take 10 mg by mouth every 6 (six) hours as needed for nausea or vomiting.      No current facility-administered medications for this encounter.    Facility-Administered Medications Ordered in Other Encounters  Medication Dose Route Frequency Provider Last Rate Last Dose  . heparin lock flush 100 unit/mL  250 Units Intracatheter PRN Gorsuch, Ni, MD      . sodium chloride flush (NS) 0.9 %  injection 3 mL  3 mL Intracatheter PRN [name], MD         REVIEW OF SYSTEMS: On review of systems, the patient reports that she is doing well overall. She denies any chest pain, shortness of breath, cough, fevers, chills, night sweats, unintended weight changes. She denies any new neurologic symptoms. She denies any visual changes or headaches. She denies any bowel or bladder disturbances, and denies abdominal pain, nausea or vomiting. She denies any new musculoskeletal or joint aches or pains. A complete review of systems is obtained and is otherwise negative.     PHYSICAL EXAM:  Unable to assess due to encounter type    ECOG = 0  0 - Asymptomatic (Fully active, able to carry on all predisease activities without restriction)  1 - Symptomatic but completely ambulatory (Restricted in physically strenuous activity but ambulatory and able to carry out work of a light or sedentary nature. For example, light housework, office work)  2 - Symptomatic, <50% in bed during the day (Ambulatory and capable of all self care but unable to carry out any work activities. Up and about more than 50% of waking hours)  3 - Symptomatic, >50% in bed, but not bedbound (Capable of only limited self-care, confined to bed or chair 50% or more of waking hours)  4 - Bedbound (Completely disabled.  Cannot carry on any self-care. Totally confined to bed or chair)  5 - Death   Eustace Pen MM, Creech RH, Tormey DC, et al. [phone]). "Toxicity and response criteria of the [facility]". Agency Oncol. 5 (6): 649-55    LABORATORY DATA:  Lab Results  Component Value Date   WBC 10.0 07/18/2018   HGB 7.8 (L) 07/18/2018   HCT 24.8 (L) 07/18/2018   MCV 87.9 07/18/2018   PLT 80 (L) 07/18/2018   Lab Results  Component Value Date   NA 137 07/18/2018   K 4.4 07/18/2018   CL 99 07/18/2018   CO2 28 07/18/2018   Lab Results  Component Value Date   ALT 11 07/18/2018   AST 15 07/18/2018   ALKPHOS 137 (H) 07/18/2018   BILITOT 0.5 07/18/2018      RADIOGRAPHY: Mr Jeri Cos YD Contrast  Result Date: 07/18/2018 CLINICAL DATA:  Breast cancer. SRS restaging of solitary right occipital metastasis. EXAM: MRI HEAD WITHOUT AND WITH CONTRAST TECHNIQUE: Multiplanar, multiecho pulse sequences of the brain and surrounding structures were obtained without and with intravenous contrast. CONTRAST:  31m MULTIHANCE GADOBENATE DIMEGLUMINE 529 MG/ML IV SOLN COMPARISON:  03/16/2018 FINDINGS: BRAIN New Lesions: None. Larger lesions: None. Stable or Smaller lesions: 8 mm mm enhancing lesion located right occipital lobe and seen on 11:75. Associated vasogenic edema is also significantly diminished. Other Brain findings: No acute infarct, hemorrhage, hydrocephalus, or collection. Chronic blood products associated with the treated metastasis. Vascular: Major flow voids are preserved Skull and upper cervical spine: New ill-defined enhancement at the level of the atlanto occipital membrane, where there was previously thin ligament and normal fat. Thickening measures up to 12 mm on sagittal postcontrast imaging. The adjacent posterior ring of C1 marrow is abnormally hypointense, but not enhancing in similar fashion. Marrow is diffusely low signal. Sinuses/Orbits: Paranasal sinus surgery.  No acute sinusitis.  IMPRESSION: 1. Regression of solitary right occipital lobe metastasis and edema. No new brain metastasis. 2. New ill-defined enhancement at the atlantooccipital membrane, presumably a metastatic focus. The adjacent bone marrow is abnormally low signal but not enhancing to the degree  of the extraosseous lesion. Marrow signal is likely from anemia. Electronically Signed   By: Monte [name] M.D.   On: 07/18/2018 07:10   Dg Chest Port 1 View  Result Date: 06/22/2018 CLINICAL DATA:  Shortness of breath EXAM: PORTABLE CHEST 1 VIEW COMPARISON:  Jun 21, 2018 FINDINGS: Port-A-Cath tip is in the superior vena cava of the cavoatrial junction. No pneumothorax. Right pleural effusion which may be partially loculated persists. There is patchy bibasilar atelectasis. There may be a degree of superimposed consolidation in the right base. Heart is mildly enlarged with pulmonary vascularity normal. No adenopathy. There is aortic atherosclerosis. No bone lesions. IMPRESSION: Right pleural effusion with atelectasis and probable superimposed consolidation in the right base region. Patchy atelectasis in the left base region is stable. No new opacity is evident compared to 1 day prior. Stable cardiac prominence. Aortic Atherosclerosis (ICD10-I70.0). Electronically Signed   By: Lowella Grip III M.D.   On: 06/22/2018 08:00       IMPRESSION/PLAN: 1. 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 The patient's recent films are reassuring and her treated lesion is improved following her SRS treatment. She does appear to have a new bony lesion in the occipital region. Her case will be discussed in conference. I suspect that this will be followed as she continues her systemic therapy that has had multiple delays. We will plan on repeat MRI in 3 months' time. She is in agreement with this plan.   Given current concerns for patient exposure during the COVID-19 pandemic, this encounter was conducted via telephone.   The patient has given verbal consent for this type of encounter. The time spent during this encounter was 15 minutes and 50% of that time was spent in the coordination of her care. The attendants for this meeting include Shona Simpson, [facility] and Charlaine Dalton  During the encounter, Shona Simpson [facility] was located remotely from home. APRILL BANKO  was located at home.     Carola Rhine, PAC

## 2018-07-21 NOTE — Telephone Encounter (Signed)
I talk with patient regarding schedule  

## 2018-07-22 ENCOUNTER — Telehealth: Payer: Self-pay

## 2018-07-22 NOTE — Telephone Encounter (Signed)
Nurse with [facility] called and left a message to call her.  Called nurse back. Rosaisela is complaining of pain to her left arm. She has some swelling to arm down to wrist. She is taking tylenol and morphine for pain control. Brionna did not want the nurse to touch her arm. She was having left shoulder pain but is has gotten better. Allona denied to nurse that she has fallen or injured her arm.

## 2018-07-23 ENCOUNTER — Telehealth: Payer: Self-pay

## 2018-07-23 ENCOUNTER — Telehealth: Payer: Self-pay | Admitting: Radiation Oncology

## 2018-07-23 ENCOUNTER — Ambulatory Visit: Payer: BC Managed Care – PPO | Admitting: Hematology and Oncology

## 2018-07-23 NOTE — Telephone Encounter (Signed)
She called to cancel today's appt at 11 am. She is unable to set up transportation. She will just keep the appt scheduled for next week as scheduled. She apologized for canceling today's appt.

## 2018-07-23 NOTE — Telephone Encounter (Signed)
Is she going to be ok taking tylenol and morphine until next week? Watch our for constipation

## 2018-07-23 NOTE — Telephone Encounter (Signed)
Called and given below message. She verbalized understanding. Appt scheduled for today at 11 am.

## 2018-07-23 NOTE — Telephone Encounter (Signed)
I called and let the patient know our plans to repeat an MRI in 3 months and she will resume chemotherapy plans with Dr. Alvy Bimler. The skull lesion we will follow as we anticipate that chemotherapy will treat this.

## 2018-07-23 NOTE — Telephone Encounter (Signed)
Called back and given below message. She verbalized understanding. She is doing okay with the pain medication. She takes the Morphine at bedtime only and prior to PT. Denies constipation and will be on the watch out for constipation. Instructed to call the office for worsening symptoms. She verbalized understanding.

## 2018-07-23 NOTE — Telephone Encounter (Signed)
I can see her today at 13 or see her next week as scheduled For now, take tylenol and morphine as needed

## 2018-07-25 ENCOUNTER — Telehealth: Payer: Self-pay

## 2018-07-25 NOTE — Telephone Encounter (Signed)
Stacy Shelton called the after hours service and left a message yesterday. She ask that Dr. Alvy Bimler call her prior to her sisters appt on 6/29. She is concerned about [name] getting chemo on Monday and trying to prevent her from going into the hospital again. She is asking if there is anything that they can do? She has a friends son who got Neulasta prior to treatment, is that a option?  I told Stacy Shelton Dr. Alvy Bimler was out of the office today and Dr. Alvy Bimler could call Monday am. She verbalized understanding.

## 2018-07-28 ENCOUNTER — Encounter: Payer: Self-pay | Admitting: Hematology and Oncology

## 2018-07-28 ENCOUNTER — Inpatient Hospital Stay: Payer: BC Managed Care – PPO

## 2018-07-28 ENCOUNTER — Other Ambulatory Visit: Payer: Self-pay

## 2018-07-28 ENCOUNTER — Inpatient Hospital Stay (HOSPITAL_BASED_OUTPATIENT_CLINIC_OR_DEPARTMENT_OTHER): Payer: BC Managed Care – PPO | Admitting: Hematology and Oncology

## 2018-07-28 ENCOUNTER — Other Ambulatory Visit: Payer: Self-pay | Admitting: Hematology and Oncology

## 2018-07-28 DIAGNOSIS — C50919 Malignant neoplasm of unspecified site of unspecified female breast: Secondary | ICD-10-CM

## 2018-07-28 DIAGNOSIS — C7800 Secondary malignant neoplasm of unspecified lung: Secondary | ICD-10-CM | POA: Diagnosis not present

## 2018-07-28 DIAGNOSIS — C787 Secondary malignant neoplasm of liver and intrahepatic bile duct: Secondary | ICD-10-CM

## 2018-07-28 DIAGNOSIS — C7951 Secondary malignant neoplasm of bone: Secondary | ICD-10-CM

## 2018-07-28 DIAGNOSIS — C50312 Malignant neoplasm of lower-inner quadrant of left female breast: Secondary | ICD-10-CM

## 2018-07-28 DIAGNOSIS — Z9221 Personal history of antineoplastic chemotherapy: Secondary | ICD-10-CM

## 2018-07-28 DIAGNOSIS — Z794 Long term (current) use of insulin: Secondary | ICD-10-CM

## 2018-07-28 DIAGNOSIS — Z5112 Encounter for antineoplastic immunotherapy: Secondary | ICD-10-CM | POA: Diagnosis not present

## 2018-07-28 DIAGNOSIS — Z7984 Long term (current) use of oral hypoglycemic drugs: Secondary | ICD-10-CM

## 2018-07-28 DIAGNOSIS — R64 Cachexia: Secondary | ICD-10-CM

## 2018-07-28 DIAGNOSIS — M7022 Olecranon bursitis, left elbow: Secondary | ICD-10-CM | POA: Insufficient documentation

## 2018-07-28 DIAGNOSIS — M79622 Pain in left upper arm: Secondary | ICD-10-CM

## 2018-07-28 DIAGNOSIS — Z79899 Other long term (current) drug therapy: Secondary | ICD-10-CM

## 2018-07-28 DIAGNOSIS — Z7951 Long term (current) use of inhaled steroids: Secondary | ICD-10-CM

## 2018-07-28 DIAGNOSIS — E1165 Type 2 diabetes mellitus with hyperglycemia: Secondary | ICD-10-CM

## 2018-07-28 DIAGNOSIS — C7931 Secondary malignant neoplasm of brain: Secondary | ICD-10-CM

## 2018-07-28 DIAGNOSIS — K219 Gastro-esophageal reflux disease without esophagitis: Secondary | ICD-10-CM

## 2018-07-28 DIAGNOSIS — D61818 Other pancytopenia: Secondary | ICD-10-CM

## 2018-07-28 DIAGNOSIS — D6481 Anemia due to antineoplastic chemotherapy: Secondary | ICD-10-CM

## 2018-07-28 DIAGNOSIS — E440 Moderate protein-calorie malnutrition: Secondary | ICD-10-CM

## 2018-07-28 DIAGNOSIS — I700 Atherosclerosis of aorta: Secondary | ICD-10-CM

## 2018-07-28 DIAGNOSIS — R5381 Other malaise: Secondary | ICD-10-CM

## 2018-07-28 DIAGNOSIS — I10 Essential (primary) hypertension: Secondary | ICD-10-CM

## 2018-07-28 DIAGNOSIS — Z171 Estrogen receptor negative status [ER-]: Secondary | ICD-10-CM

## 2018-07-28 DIAGNOSIS — C7801 Secondary malignant neoplasm of right lung: Secondary | ICD-10-CM

## 2018-07-28 LAB — CBC WITH DIFFERENTIAL (CANCER CENTER ONLY)
Abs Immature Granulocytes: 1.57 10*3/uL — ABNORMAL HIGH (ref 0.00–0.07)
Basophils Absolute: 0.4 10*3/uL — ABNORMAL HIGH (ref 0.0–0.1)
Basophils Relative: 4 %
Eosinophils Absolute: 0 10*3/uL (ref 0.0–0.5)
Eosinophils Relative: 0 %
HCT: 25.7 % — ABNORMAL LOW (ref 36.0–46.0)
Hemoglobin: 8.1 g/dL — ABNORMAL LOW (ref 12.0–15.0)
Immature Granulocytes: 15 %
Lymphocytes Relative: 28 %
Lymphs Abs: 2.9 10*3/uL (ref 0.7–4.0)
MCH: 27.1 pg (ref 26.0–34.0)
MCHC: 31.5 g/dL (ref 30.0–36.0)
MCV: 86 fL (ref 80.0–100.0)
Monocytes Absolute: 1.3 10*3/uL — ABNORMAL HIGH (ref 0.1–1.0)
Monocytes Relative: 12 %
Neutro Abs: 4.3 10*3/uL (ref 1.7–7.7)
Neutrophils Relative %: 41 %
Platelet Count: 87 10*3/uL — ABNORMAL LOW (ref 150–400)
RBC: 2.99 MIL/uL — ABNORMAL LOW (ref 3.87–5.11)
RDW: 17.3 % — ABNORMAL HIGH (ref 11.5–15.5)
WBC Count: 10.5 10*3/uL (ref 4.0–10.5)
nRBC: 0.5 % — ABNORMAL HIGH (ref 0.0–0.2)

## 2018-07-28 LAB — CMP (CANCER CENTER ONLY)
ALT: 13 U/L (ref 0–44)
AST: 16 U/L (ref 15–41)
Albumin: 2.4 g/dL — ABNORMAL LOW (ref 3.5–5.0)
Alkaline Phosphatase: 236 U/L — ABNORMAL HIGH (ref 38–126)
Anion gap: 11 (ref 5–15)
BUN: 29 mg/dL — ABNORMAL HIGH (ref 6–20)
CO2: 29 mmol/L (ref 22–32)
Calcium: 9.7 mg/dL (ref 8.9–10.3)
Chloride: 99 mmol/L (ref 98–111)
Creatinine: 0.74 mg/dL (ref 0.44–1.00)
GFR, Est AFR Am: >60 mL/min (ref >60)
GFR, Estimated: >60 mL/min (ref >60)
Glucose, Bld: 65 mg/dL — ABNORMAL LOW (ref 70–99)
Potassium: 4.5 mmol/L (ref 3.5–5.1)
Sodium: 139 mmol/L (ref 135–145)
Total Bilirubin: 0.5 mg/dL (ref 0.3–1.2)
Total Protein: 7.8 g/dL (ref 6.5–8.1)

## 2018-07-28 LAB — SAMPLE TO BLOOD BANK

## 2018-07-28 MED ORDER — SODIUM CHLORIDE 0.9 % IV SOLN
420.0000 mg | Freq: Once | INTRAVENOUS | Status: AC
  Administered 2018-07-28: 420 mg via INTRAVENOUS
  Filled 2018-07-28: qty 14

## 2018-07-28 MED ORDER — SODIUM CHLORIDE 0.9 % IV SOLN
15.0000 mg/m2 | Freq: Once | INTRAVENOUS | Status: AC
  Administered 2018-07-28: 20 mg via INTRAVENOUS
  Filled 2018-07-28: qty 2

## 2018-07-28 MED ORDER — TRASTUZUMAB CHEMO 150 MG IV SOLR
6.0000 mg/kg | Freq: Once | INTRAVENOUS | Status: AC
  Administered 2018-07-28: 336 mg via INTRAVENOUS
  Filled 2018-07-28: qty 16

## 2018-07-28 MED ORDER — SODIUM CHLORIDE 0.9 % IV SOLN
Freq: Once | INTRAVENOUS | Status: AC
  Administered 2018-07-28: 09:00:00 via INTRAVENOUS
  Filled 2018-07-28: qty 250

## 2018-07-28 MED ORDER — DIPHENHYDRAMINE HCL 25 MG PO CAPS
50.0000 mg | ORAL_CAPSULE | Freq: Once | ORAL | Status: AC
  Administered 2018-07-28: 50 mg via ORAL

## 2018-07-28 MED ORDER — SODIUM CHLORIDE 0.9% FLUSH
10.0000 mL | INTRAVENOUS | Status: DC | PRN
  Administered 2018-07-28: 10 mL
  Filled 2018-07-28: qty 10

## 2018-07-28 MED ORDER — DIPHENHYDRAMINE HCL 25 MG PO CAPS
ORAL_CAPSULE | ORAL | Status: AC
  Filled 2018-07-28: qty 2

## 2018-07-28 MED ORDER — HEPARIN SOD (PORK) LOCK FLUSH 100 UNIT/ML IV SOLN
500.0000 [IU] | Freq: Once | INTRAVENOUS | Status: AC | PRN
  Administered 2018-07-28: 500 [IU]
  Filled 2018-07-28: qty 5

## 2018-07-28 MED ORDER — MIRTAZAPINE 30 MG PO TABS: 30.0000 mg | ORAL_TABLET | Freq: Every day | ORAL | 9 refills | Status: DC

## 2018-07-28 MED ORDER — DEXAMETHASONE SODIUM PHOSPHATE 10 MG/ML IJ SOLN
INTRAMUSCULAR | Status: AC
  Filled 2018-07-28: qty 1

## 2018-07-28 MED ORDER — ACETAMINOPHEN 325 MG PO TABS
650.0000 mg | ORAL_TABLET | Freq: Once | ORAL | Status: AC
  Administered 2018-07-28: 650 mg via ORAL

## 2018-07-28 MED ORDER — ACETAMINOPHEN 325 MG PO TABS
ORAL_TABLET | ORAL | Status: AC
  Filled 2018-07-28: qty 2

## 2018-07-28 MED ORDER — DEXAMETHASONE SODIUM PHOSPHATE 10 MG/ML IJ SOLN
5.0000 mg | Freq: Once | INTRAMUSCULAR | Status: AC
  Administered 2018-07-28: 5 mg via INTRAVENOUS

## 2018-07-28 MED ORDER — SODIUM CHLORIDE 0.9 % IV SOLN: 15.0000 mg/m2 | Freq: Once | INTRAVENOUS | Status: DC

## 2018-07-28 NOTE — Progress Notes (Signed)
Per Dr. Alvy Bimler- ok to proceed with treatment today labs were reviewed.

## 2018-07-28 NOTE — Assessment & Plan Note (Signed)
Her left upper arm pain is most consistent with olecranon bursitis She will continue to take morphine and conservative management with heat pad only

## 2018-07-28 NOTE — Assessment & Plan Note (Signed)
Her strength continues to improve She will continue home physical therapy

## 2018-07-28 NOTE — Assessment & Plan Note (Signed)
Her lung exam is clear She will continue to use oxygen as needed

## 2018-07-28 NOTE — Assessment & Plan Note (Signed)
She had history of uncontrolled hyperglycemia with corticosteroid therapy Since a dose of Taxotere is small and I plan to reduce the dose of IV premed dexamethasone, she will not take oral dexamethasone after treatment as prescribed She will continue to adjust her insulin dose accordingly

## 2018-07-28 NOTE — Assessment & Plan Note (Signed)
Overall, she continues to improve since last time I saw her I plan to restart her chemotherapy today We discussed the risk, benefits, side effects of resumption of treatment with further dose adjustment or omission of Taxotere After a prolonged discussion, we are in agreement to proceed with Taxotere with 20% of calculated dose  I also reviewed with her recent findings on MRI of the brain which I do not believe represent new disease She will get MRI repeated in 3 months We will monitor her closely after chemotherapy on a weekly basis to avoid admissions to the hospital

## 2018-07-28 NOTE — Progress Notes (Signed)
MD ok with 20 mg dose of Taxotere.   Larene Beach, PharmD

## 2018-07-28 NOTE — Assessment & Plan Note (Signed)
She continues to have protein calorie malnutrition I recommend increasing mirtazapine to 30 mg/day and she agreed to proceed

## 2018-07-28 NOTE — Patient Instructions (Signed)
[place] Discharge Instructions for Patients Receiving Chemotherapy  Today you received the following chemotherapy agents: trastuzumab (Herceptin), pertuzumab (Perjeta), docetaxel (Taxotere).   To help prevent nausea and vomiting after your treatment, we encourage you to take your nausea medication as prescribed by your physician.   If you develop nausea and vomiting that is not controlled by your nausea medication, call the clinic.   BELOW ARE SYMPTOMS THAT SHOULD BE REPORTED IMMEDIATELY:  *FEVER GREATER THAN 100.5 F  *CHILLS WITH OR WITHOUT FEVER  NAUSEA AND VOMITING THAT IS NOT CONTROLLED WITH YOUR NAUSEA MEDICATION  *UNUSUAL SHORTNESS OF BREATH  *UNUSUAL BRUISING OR BLEEDING  TENDERNESS IN MOUTH AND THROAT WITH OR WITHOUT PRESENCE OF ULCERS  *URINARY PROBLEMS  *BOWEL PROBLEMS  UNUSUAL RASH Items with * indicate a potential emergency and should be followed up as soon as possible.  Feel free to call the clinic should you have any questions or concerns. The clinic phone number is [phone].  Please show the [place] at check-in to the Emergency Department and triage nurse.  Trastuzumab injection for infusion What is this medicine? TRASTUZUMAB (tras TOO zoo mab) is a monoclonal antibody. It is used to treat breast cancer and stomach cancer. This medicine may be used for other purposes; ask your health care provider or pharmacist if you have questions. COMMON BRAND NAME(S): Herceptin, Calla Kicks, OGIVRI What should I tell my health care provider before I take this medicine? They need to know if you have any of these conditions: -heart disease -heart failure -lung or breathing disease, like asthma -an unusual or allergic reaction to trastuzumab, benzyl alcohol, or other medications, foods, dyes, or preservatives -pregnant or trying to get pregnant -breast-feeding How should I use this medicine? This drug is given as an infusion into a vein. It  is administered in a hospital or clinic by a specially trained health care professional. Talk to your pediatrician regarding the use of this medicine in children. This medicine is not approved for use in children. Overdosage: If you think you have taken too much of this medicine contact a poison control center or emergency room at once. NOTE: This medicine is only for you. Do not share this medicine with others. What if I miss a dose? It is important not to miss a dose. Call your doctor or health care professional if you are unable to keep an appointment. What may interact with this medicine? This medicine may interact with the following medications: -certain types of chemotherapy, such as daunorubicin, doxorubicin, epirubicin, and idarubicin This list may not describe all possible interactions. Give your health care provider a list of all the medicines, herbs, non-prescription drugs, or dietary supplements you use. Also tell them if you smoke, drink alcohol, or use illegal drugs. Some items may interact with your medicine. What should I watch for while using this medicine? Visit your doctor for checks on your progress. Report any side effects. Continue your course of treatment even though you feel ill unless your doctor tells you to stop. Call your doctor or health care professional for advice if you get a fever, chills or sore throat, or other symptoms of a cold or flu. Do not treat yourself. Try to avoid being around people who are sick. You may experience fever, chills and shaking during your first infusion. These effects are usually mild and can be treated with other medicines. Report any side effects during the infusion to your health care professional. Fever and chills usually  do not happen with later infusions. Do not become pregnant while taking this medicine or for 7 months after stopping it. Women should inform their doctor if they wish to become pregnant or think they might be pregnant.  Women of child-bearing potential will need to have a negative pregnancy test before starting this medicine. There is a potential for serious side effects to an unborn child. Talk to your health care professional or pharmacist for more information. Do not breast-feed an infant while taking this medicine or for 7 months after stopping it. Women must use effective birth control with this medicine. What side effects may I notice from receiving this medicine? Side effects that you should report to your doctor or health care professional as soon as possible: -allergic reactions like skin rash, itching or hives, swelling of the face, lips, or tongue -chest pain or palpitations -cough -dizziness -feeling faint or lightheaded, falls -fever -general ill feeling or flu-like symptoms -signs of worsening heart failure like breathing problems; swelling in your legs and feet -unusually weak or tired Side effects that usually do not require medical attention (report to your doctor or health care professional if they continue or are bothersome): -bone pain -changes in taste -diarrhea -joint pain -nausea/vomiting -weight loss This list may not describe all possible side effects. Call your doctor for medical advice about side effects. You may report side effects to FDA at 1-800-FDA-1088. Where should I keep my medicine? This drug is given in a hospital or clinic and will not be stored at home. NOTE: This sheet is a summary. It may not cover all possible information. If you have questions about this medicine, talk to your doctor, pharmacist, or health care provider.  2019 Elsevier/Gold Standard (2016-01-10 14:37:52)  Pertuzumab injection What is this medicine? PERTUZUMAB (per TOOZ ue mab) is a monoclonal antibody. It is used to treat breast cancer. This medicine may be used for other purposes; ask your health care provider or pharmacist if you have questions. COMMON BRAND NAME(S): PERJETA What should I  tell my health care provider before I take this medicine? They need to know if you have any of these conditions: -heart disease -heart failure -high blood pressure -history of irregular heart beat -recent or ongoing radiation therapy -an unusual or allergic reaction to pertuzumab, other medicines, foods, dyes, or preservatives -pregnant or trying to get pregnant -breast-feeding How should I use this medicine? This medicine is for infusion into a vein. It is given by a health care professional in a hospital or clinic setting. Talk to your pediatrician regarding the use of this medicine in children. Special care may be needed. Overdosage: If you think you have taken too much of this medicine contact a poison control center or emergency room at once. NOTE: This medicine is only for you. Do not share this medicine with others. What if I miss a dose? It is important not to miss your dose. Call your doctor or health care professional if you are unable to keep an appointment. What may interact with this medicine? Interactions are not expected. Give your health care provider a list of all the medicines, herbs, non-prescription drugs, or dietary supplements you use. Also tell them if you smoke, drink alcohol, or use illegal drugs. Some items may interact with your medicine. This list may not describe all possible interactions. Give your health care provider a list of all the medicines, herbs, non-prescription drugs, or dietary supplements you use. Also tell them if you smoke, drink alcohol,  or use illegal drugs. Some items may interact with your medicine. What should I watch for while using this medicine? Your condition will be monitored carefully while you are receiving this medicine. Report any side effects. Continue your course of treatment even though you feel ill unless your doctor tells you to stop. Do not become pregnant while taking this medicine or for 7 months after stopping it. Women should  inform their doctor if they wish to become pregnant or think they might be pregnant. Women of child-bearing potential will need to have a negative pregnancy test before starting this medicine. There is a potential for serious side effects to an unborn child. Talk to your health care professional or pharmacist for more information. Do not breast-feed an infant while taking this medicine or for 7 months after stopping it. Women must use effective birth control with this medicine. Call your doctor or health care professional for advice if you get a fever, chills or sore throat, or other symptoms of a cold or flu. Do not treat yourself. Try to avoid being around people who are sick. You may experience fever, chills, and headache during the infusion. Report any side effects during the infusion to your health care professional. What side effects may I notice from receiving this medicine? Side effects that you should report to your doctor or health care professional as soon as possible: -breathing problems -chest pain or palpitations -dizziness -feeling faint or lightheaded -fever or chills -skin rash, itching or hives -sore throat -swelling of the face, lips, or tongue -swelling of the legs or ankles -unusually weak or tired Side effects that usually do not require medical attention (report to your doctor or health care professional if they continue or are bothersome): -diarrhea -hair loss -nausea, vomiting -tiredness This list may not describe all possible side effects. Call your doctor for medical advice about side effects. You may report side effects to FDA at 1-800-FDA-1088. Where should I keep my medicine? This drug is given in a hospital or clinic and will not be stored at home. NOTE: This sheet is a summary. It may not cover all possible information. If you have questions about this medicine, talk to your doctor, pharmacist, or health care provider.  2019 Elsevier/Gold Standard (2015-02-17  12:08:50)  Docetaxel injection What is this medicine? DOCETAXEL (doe se TAX el) is a chemotherapy drug. It targets fast dividing cells, like cancer cells, and causes these cells to die. This medicine is used to treat many types of cancers like breast cancer, certain stomach cancers, head and neck cancer, lung cancer, and prostate cancer. This medicine may be used for other purposes; ask your health care provider or pharmacist if you have questions. COMMON BRAND NAME(S): Docefrez, Taxotere What should I tell my health care provider before I take this medicine? They need to know if you have any of these conditions: -infection (especially a virus infection such as chickenpox, cold sores, or herpes) -liver disease -low blood counts, like low white cell, platelet, or red cell counts -an unusual or allergic reaction to docetaxel, polysorbate 80, other chemotherapy agents, other medicines, foods, dyes, or preservatives -pregnant or trying to get pregnant -breast-feeding How should I use this medicine? This drug is given as an infusion into a vein. It is administered in a hospital or clinic by a specially trained health care professional. Talk to your pediatrician regarding the use of this medicine in children. Special care may be needed. Overdosage: If you think you have taken too  much of this medicine contact a poison control center or emergency room at once. NOTE: This medicine is only for you. Do not share this medicine with others. What if I miss a dose? It is important not to miss your dose. Call your doctor or health care professional if you are unable to keep an appointment. What may interact with this medicine? -cyclosporine -erythromycin -ketoconazole -medicines to increase blood counts like filgrastim, pegfilgrastim, sargramostim -vaccines Talk to your doctor or health care professional before taking any of these  medicines: -acetaminophen -aspirin -ibuprofen -ketoprofen -naproxen This list may not describe all possible interactions. Give your health care provider a list of all the medicines, herbs, non-prescription drugs, or dietary supplements you use. Also tell them if you smoke, drink alcohol, or use illegal drugs. Some items may interact with your medicine. What should I watch for while using this medicine? Your condition will be monitored carefully while you are receiving this medicine. You will need important blood work done while you are taking this medicine. This drug may make you feel generally unwell. This is not uncommon, as chemotherapy can affect healthy cells as well as cancer cells. Report any side effects. Continue your course of treatment even though you feel ill unless your doctor tells you to stop. In some cases, you may be given additional medicines to help with side effects. Follow all directions for their use. Call your doctor or health care professional for advice if you get a fever, chills or sore throat, or other symptoms of a cold or flu. Do not treat yourself. This drug decreases your body's ability to fight infections. Try to avoid being around people who are sick. This medicine may increase your risk to bruise or bleed. Call your doctor or health care professional if you notice any unusual bleeding. This medicine may contain alcohol in the product. You may get drowsy or dizzy. Do not drive, use machinery, or do anything that needs mental alertness until you know how this medicine affects you. Do not stand or sit up quickly, especially if you are an older patient. This reduces the risk of dizzy or fainting spells. Avoid alcoholic drinks. Do not become pregnant while taking this medicine or for 6 months after stopping it. Women should inform their doctor if they wish to become pregnant or think they might be pregnant. Men should not father a child while taking this medicine and for 3  months after stopping it. There is a potential for serious side effects to an unborn child. Talk to your health care professional or pharmacist for more information. Do not breast-feed an infant while taking this medicine or for 2 weeks after stopping it. This may interfere with the ability to father a child. You should talk to your doctor or health care professional if you are concerned about your fertility. What side effects may I notice from receiving this medicine? Side effects that you should report to your doctor or health care professional as soon as possible: -allergic reactions like skin rash, itching or hives, swelling of the face, lips, or tongue -low blood counts - This drug may decrease the number of white blood cells, red blood cells and platelets. You may be at increased risk for infections and bleeding. -signs of infection - fever or chills, cough, sore throat, pain or difficulty passing urine -signs of decreased platelets or bleeding - bruising, pinpoint red spots on the skin, black, tarry stools, nosebleeds -signs of decreased red blood cells - unusually weak  or tired, fainting spells, lightheadedness -breathing problems -fast or irregular heartbeat -low blood pressure -mouth sores -nausea and vomiting -pain, swelling, redness or irritation at the injection site -pain, tingling, numbness in the hands or feet -swelling of the ankle, feet, hands -weight gain Side effects that usually do not require medical attention (report to your doctor or health care professional if they continue or are bothersome): -bone pain -complete hair loss including hair on your head, underarms, pubic hair, eyebrows, and eyelashes -diarrhea -excessive tearing -changes in the color of fingernails -loosening of the fingernails -nausea -muscle pain -red flush to skin -sweating -weak or tired This list may not describe all possible side effects. Call your doctor for medical advice about side  effects. You may report side effects to FDA at 1-800-FDA-1088. Where should I keep my medicine? This drug is given in a hospital or clinic and will not be stored at home. NOTE: This sheet is a summary. It may not cover all possible information. If you have questions about this medicine, talk to your doctor, pharmacist, or health care provider.  2019 Elsevier/Gold Standard (2017-02-11 12:07:21)   Coronavirus (COVID-19) Are you at risk?  Are you at risk for the Coronavirus (COVID-19)?  To be considered HIGH RISK for Coronavirus (COVID-19), you have to meet the following criteria:  . Traveled to [place], [place], [place], [place] or [place]; or in the [place] to [place], [place], [place], or [place]; and have fever, cough, and shortness of breath within the last 2 weeks of travel OR . Been in close contact with a person diagnosed with COVID-19 within the last 2 weeks and have fever, cough, and shortness of breath . IF YOU DO NOT MEET THESE CRITERIA, YOU ARE CONSIDERED LOW RISK FOR COVID-19.  What to do if you are HIGH RISK for COVID-19?  Marland Kitchen If you are having a medical emergency, call 911. . Seek medical care right away. Before you go to a doctor's office, urgent care or emergency department, call ahead and tell them about your recent travel, contact with someone diagnosed with COVID-19, and your symptoms. You should receive instructions from your physician's office regarding next steps of care.  . When you arrive at healthcare provider, tell the healthcare staff immediately you have returned from visiting [place], [place], [place], [place] or [place]; or traveled in the [place] to [place], [place], [place], or [place]; in the last two weeks or you have been in close contact with a person diagnosed with COVID-19 in the last 2 weeks.   . Tell the health care staff about your symptoms: fever, cough and shortness of breath. . After you have been seen by a medical provider, you  will be either: o Tested for (COVID-19) and discharged home on quarantine except to seek medical care if symptoms worsen, and asked to  - Stay home and avoid contact with others until you get your results (4-5 days)  - Avoid travel on public transportation if possible (such as bus, train, or airplane) or o Sent to the Emergency Department by EMS for evaluation, COVID-19 testing, and possible admission depending on your condition and test results.  What to do if you are LOW RISK for COVID-19?  Reduce your risk of any infection by using the same precautions used for avoiding the common cold or flu:  Marland Kitchen Wash your hands often with soap and warm water for at least 20 seconds.  If soap and water are not readily  available, use an alcohol-based hand sanitizer with at least 60% alcohol.  . If coughing or sneezing, cover your mouth and nose by coughing or sneezing into the elbow areas of your shirt or coat, into a tissue or into your sleeve (not your hands). . Avoid shaking hands with others and consider head nods or verbal greetings only. . Avoid touching your eyes, nose, or mouth with unwashed hands.  . Avoid close contact with people who are sick. . Avoid places or events with large numbers of people in one location, like concerts or sporting events. . Carefully consider travel plans you have or are making. . If you are planning any travel outside or inside the [place], visit the CDC's Travelers' Health webpage for the latest health notices. . If you have some symptoms but not all symptoms, continue to monitor at home and seek medical attention if your symptoms worsen. . If you are having a medical emergency, call 911.   [place] / e-Visit: [email or web address]         [facility]: [facility]: [phone]                   [facility]: [phone]

## 2018-07-28 NOTE — Assessment & Plan Note (Signed)
She does not require transfusion support today However, given her past history, we will get her to come back with repeat labs in 3 days We will proceed with chemo with dose adjustment

## 2018-07-28 NOTE — Progress Notes (Signed)
[facility] OFF[facility] PROGRESS NOTE  Patient Care Team: Robyne Peers, MD as PCP - General (Family Medicine) Elouise Munroe, MD as PCP - Cardiology (Cardiology)  ASSESSMENT & PLAN:  Metastatic breast cancer ([place]) Overall, she continues to improve since last time I saw her I plan to restart her chemotherapy today We discussed the risk, benefits, side effects of resumption of treatment with further dose adjustment or omission of Taxotere After a prolonged discussion, we are in agreement to proceed with Taxotere with 20% of calculated dose  I also reviewed with her recent findings on MRI of the brain which I do not believe represent new disease She will get MRI repeated in 3 months We will monitor her closely after chemotherapy on a weekly basis to avoid admissions to the hospital  Pancytopenia, acquired [facility]) She does not require transfusion support today However, given her past history, we will get her to come back with repeat labs in 3 days We will proceed with chemo with dose adjustment  Metastasis to brain [facility]) We have presented her case at the brain tumor board recently Our plan is to observe with repeat serial imaging in 3 months  Olecranon bursitis, left elbow Her left upper arm pain is most consistent with olecranon bursitis She will continue to take morphine and conservative management with heat pad only  Metastasis to lung [facility]) Her lung exam is clear She will continue to use oxygen as needed  Malnutrition of moderate degree She continues to have protein calorie malnutrition I recommend increasing mirtazapine to 30 mg/day and she agreed to proceed  Physical debility Her strength continues to improve She will continue home physical therapy   Uncontrolled diabetes mellitus with hyperglycemia ([place]) She had history of uncontrolled hyperglycemia with corticosteroid therapy Since a dose of Taxotere is small and I plan to reduce the dose of IV premed  dexamethasone, she will not take oral dexamethasone after treatment as prescribed She will continue to adjust her insulin dose accordingly   No orders of the defined types were placed in this encounter.   INTERVAL HISTORY: Please see below for problem oriented charting. She returns for further follow-up and chemotherapy I also spoke with her sister over the phone Overall, she continues to improve No recent infection, fever or chills No cough or shortness of breath She is able to wean herself oxygen She complained of left upper arm pain last week and unable to get here for evaluation Since then, her arm pain is slowly improving She did have to take additional morphine sulfate last week The pain is located in the elbow region It does not interfere with her activity of her hand right now She is not able to participate much with physical therapy because of that No recent nausea Her appetite is stable She is participating with physical therapy and her strength continues to improve. She is able get up to the bathroom by herself No recent reported falls Her sacral decubitus ulcer is slowly improving  SUMMARY OF ONCOLOGIC HISTORY: Oncology History Overview Note  Biopsy is ER negative Her2/neu positive   Metastatic breast cancer ([place])  03/11/2018 Imaging   Ct scan of chest, abdomen and pelvis 1. 2.6 x 2.3 cm spiculated mass identified inferomedial quadrant of the left breast. Lesion appears to retract the overlying skin. Mammographic correlation recommended. 2. Small lymph nodes in the left axillar ill-defined and suspicious for metastatic disease. 3. Multiple bilateral pulmonary nodules measuring up to 2.7 cm diameter. These probably  represent metastatic disease. Given the dominant size of the central right upper lobe lesion, synchronous lung primary can not be completely excluded. 4. Multiple ill-defined liver lesions compatible with metastatic disease. Dominant liver metastases  measure up to almost 4 cm. 5.  Aortic Atherosclerois (ICD10-170.0)    03/12/2018 Pathology Results   Liver, needle/core biopsy, left lobe - METASTATIC CARCINOMA. - LYMPHOVASCULAR INVASION IS IDENTIFIED. - SEE COMMENT. Microscopic Comment The tumor cells are positive for cytokeratin 7. There is faint staining for GATA-3. There is non-specific staining for TTF-1. Cytokeratin 20, CDX-2, estrogen receptor, and GCDFP stains are negative. Given the clinical suspicion, the profile supports a primary breast carcinoma. Her2 will be performed and the results reported separately.  By immunohistochemistry, the tumor cells are POSITIVE for Her2 (3+).   03/12/2018 Imaging   Single 1.3 x 1.4 cm RIGHT occipital metastasis.  Borderline pachymeningeal enhancement of symmetric nature could be related to the superficial metastasis or osseous disease.   03/12/2018 Procedure   Ultrasound-guided core biopsy performed of a mass within the lateral segment of the left lobe of the liver.   03/16/2018 Imaging   Right occipital lobe 13 x 16 mm. Small satellite enhancing nodule deep to the larger lesion. The larger lesion shows central necrosis and probable mild hemorrhage or calcification.  Mild dural enhancement is less impressive and could be within normal limits.  Bone marrow in the clivus and cervical spine is diffusely low signal. No focal lesion. This may be related to the patient's anemia and abnormal blood count.   03/17/2018 Cancer Staging   Staging form: Breast, AJCC 8th Edition - Clinical: Stage IV (cT2, cN0, pM1, GX, ER-, PR: Not Assessed, HER2+) - Signed by [name], MD on 03/17/2018   03/19/2018 -  Chemotherapy   She received chemo with Taxotere, Herceptin and Perjeta   03/21/2018 Echocardiogram   IMPRESSIONS 1. The left ventricle has normal systolic function with an ejection fraction of 60-65%. The cavity size was normal. Left ventricular diastolic Doppler parameters are consistent with  impaired relaxation.  2. The right ventricle has normal systolic function. The cavity was normal. There is no increase in right ventricular wall thickness.  3. The mitral valve is normal in structure.  4. The tricuspid valve is normal in structure.  5. Strain imaging performed but not reported due to interpreter judgement, secondary to suboptimal image quality.   03/24/2018 Procedure   Successful placement of a right IJ approach Power Port with ultrasound and fluoroscopic guidance. The catheter is ready for use.   04/07/2018 - 04/16/2018 Hospital Admission   She was admitted for pneumonia   04/07/2018 - 04/16/2018 Hospital Admission   She was admitted to the hospital for pneumonia and respiratory failure   04/25/2018 Imaging   Retropharyngeal fluid collection compatible with effusion or possibly abscess. Soft tissue thickening extends into the right neck and surrounds the right carotid bifurcation and right internal carotid artery. There is also streaky density in the right lateral neck soft tissues with a focal 7.6 Mm lymph node in the right lateral neck. Stranding surrounds the right submandibular gland.   Findings are most compatible with infection. Favor pharyngitis with retropharyngeal effusion/abscess. Soft tissue thickening around the right carotid most compatible with infection rather than tumor. Right jugular vein is patent.  Multiple nodular densities in the right upper lobe may represent residual pneumonia or metastatic disease. Interval improvement in right upper lobe infiltrate compared with CT of 04/08/2018   04/25/2018 - 05/06/2018 Hospital Admission  She was admitted for retropharyngeal abscess   04/27/2018 Imaging   No evidence of large central pulmonary embolus is noted in the main pulmonary artery or the main portions of the right and left pulmonary arteries. However, evaluation of the lower lobe branches, particularly on the right, is limited due to respiratory motion artifact  and other limiting issues. Pulmonary emboli and smaller peripheral branches can not be excluded on the basis of this exam.  Large left pleural effusion is noted with complete atelectasis of the left lower lobe. Increased left upper lobe opacity is noted posteriorly concerning for atelectasis or pneumonia.  Improved right upper lobe and lower lobe opacities are noted suggesting improving pneumonia or atelectasis.  15 mm right paratracheal lymph node is noted which is enlarged compared to prior exam; it is uncertain if this is metastatic or inflammatory in etiology.  Hepatic metastatic lesions are again noted.  Aortic Atherosclerosis (ICD10-I70.0).    04/27/2018 Procedure   Successful ultrasound guided left thoracentesis yielding 120 mL of blood-tinged of pleural fluid.   06/11/2018 Imaging   1. No evidence of pulmonary emboli. 2. Extensive bilateral lower lobe consolidation. 3. Small pleural effusions. 4. 2.5 cm right upper lobe nodule, enlarged from 04/27/2018 and which may reflect a metastasis or primary lung cancer. Stable to mild enlargement of subcentimeter nodules in both upper lobes consistent with known metastases. 5. Decreased size of liver metastases. 6. Unchanged mild mediastinal and right hilar lymphadenopathy. 7. Aortic Atherosclerosis (ICD10-I70.0).   06/11/2018 - 06/22/2018 Hospital Admission   She was hospitalized for infection and SVT   07/18/2018 Imaging   MRI brain 1. Regression of solitary right occipital lobe metastasis and edema. No new brain metastasis. 2. New ill-defined enhancement at the atlantooccipital membrane, presumably a metastatic focus. The adjacent bone marrow is abnormally low signal but not enhancing to the degree of the extraosseous lesion. Marrow signal is likely from anemia.     Metastasis to brain ([place])  03/17/2018 Initial Diagnosis   Metastasis to brain [facility])   Metastasis to liver ([place])  03/17/2018 Initial Diagnosis   Metastasis to  liver [facility])   Metastasis to bone ([place])  03/17/2018 Initial Diagnosis   Metastasis to bone ([facility])     REVIEW OF SYSTEMS:   Constitutional: Denies fevers, chills or abnormal weight loss Eyes: Denies blurriness of vision Ears, nose, mouth, throat, and face: Denies mucositis or sore throat Respiratory: Denies cough, dyspnea or wheezes Cardiovascular: Denies palpitation, chest discomfort or lower extremity swelling Gastrointestinal:  Denies nausea, heartburn or change in bowel habits Skin: Denies abnormal skin rashes Lymphatics: Denies new lymphadenopathy or easy bruising Neurological:Denies numbness, tingling or new weaknesses Behavioral/Psych: Mood is stable, no new changes  All other systems were reviewed with the patient and are negative.  I have reviewed the past medical history, past surgical history, social history and family history with the patient and they are unchanged from previous note.  ALLERGIES:  is allergic to nsaids.  MEDICATIONS:  Current Outpatient Medications  Medication Sig Dispense Refill  . ACCU-CHEK AVIVA PLUS test strip     . ACCU-CHEK SOFTCLIX LANCETS lancets     . acetaminophen (TYLENOL) 325 MG tablet Take 2 tablets (650 mg total) by mouth every 6 (six) hours as needed for mild pain (or Fever >/= 101). 30 tablet 0  . albuterol (PROAIR HFA) 108 (90 Base) MCG/ACT inhaler Inhale 2 puffs into the lungs every 6 (six) hours as needed for wheezing or shortness of breath. 1 Inhaler 1  .  amiodarone (PACERONE) 200 MG tablet Take 1 tablet (200 mg total) by mouth daily. 30 tablet 0  . benzonatate (TESSALON) 200 MG capsule Take 1 capsule (200 mg total) by mouth 3 (three) times daily as needed for cough (use second). 20 capsule 0  . blood glucose meter kit and supplies KIT Dispense based on patient and insurance preference. Use up to four times daily as directed. (FOR ICD-9 250.00, 250.01). 1 each 0  . esomeprazole (NEXIUM) 40 MG capsule Take 1 capsule (40 mg total) by mouth  daily at 12 noon. 90 capsule 3  . feeding supplement, ENSURE ENLIVE, (ENSURE ENLIVE) LIQD Take 237 mLs by mouth 3 (three) times daily between meals. 237 mL 12  . fluticasone (FLOVENT HFA) 44 MCG/ACT inhaler Inhale 2 puffs into the lungs 2 (two) times daily. 3 Inhaler 1  . insulin aspart (NOVOLOG) 100 UNIT/ML FlexPen Inject 8 Units into the skin 3 (three) times daily with meals. (Patient taking differently: Inject 8 Units into the skin 3 (three) times daily with meals. Holds dose if not going to eat.) 15 mL 11  . Insulin Detemir (LEVEMIR) 100 UNIT/ML Pen Inject 10 Units into the skin daily. (Patient taking differently: Inject 10 Units into the skin at bedtime. ) 15 mL 0  . Insulin Pen Needle 31G X 5 MM MISC 1 Device by Does not apply route QID. For use with insulin pens 100 each 0  . lidocaine-prilocaine (EMLA) cream Apply to affected area once (Patient taking differently: Apply 1 application topically as needed (port access). Apply to affected area once) 30 g 3  . LORazepam (ATIVAN) 0.5 MG tablet Take 1 tab po 30 minutes prior to radiation or MRI (Patient taking differently: Take 0.5 mg by mouth See admin instructions. Take 30 minutes prior to radiation or MRI) 30 tablet 0  . metFORMIN (GLUCOPHAGE) 500 MG tablet Take 500 mg by mouth daily with breakfast.     . metoprolol tartrate (LOPRESSOR) 25 MG tablet Take 0.5 tablets (12.5 mg total) by mouth 2 (two) times daily. 60 tablet 0  . mirtazapine (REMERON) 30 MG tablet Take 1 tablet (30 mg total) by mouth at bedtime. 60 tablet 9  . montelukast (SINGULAIR) 10 MG tablet Take 1 tablet (10 mg total) by mouth daily. 30 tablet 1  . morphine (MSIR) 15 MG tablet Take 1 tablet (15 mg total) by mouth every 4 (four) hours as needed for severe pain. 60 tablet 0  . Multiple Vitamin (MULTIVITAMIN WITH MINERALS) TABS tablet Take 1 tablet by mouth daily.    . ondansetron (ZOFRAN) 8 MG tablet Take 8 mg by mouth every 8 (eight) hours as needed for nausea or vomiting.     .  prochlorperazine (COMPAZINE) 10 MG tablet Take 10 mg by mouth every 6 (six) hours as needed for nausea or vomiting.      No current facility-administered medications for this visit.    Facility-Administered Medications Ordered in Other Visits  Medication Dose Route Frequency Provider Last Rate Last Dose  . heparin lock flush 100 unit/mL  250 Units Intracatheter PRN Alvy Bimler, Charly Hunton, MD      . heparin lock flush 100 unit/mL  500 Units Intracatheter Once PRN Alvy Bimler, Jerremy Maione, MD      . sodium chloride flush (NS) 0.9 % injection 10 mL  10 mL Intracatheter PRN Liliya Fullenwider, MD      . sodium chloride flush (NS) 0.9 % injection 3 mL  3 mL Intracatheter PRN [name], MD  PHYSICAL EXAMINATION: ECOG PERFORMANCE STATUS: 2 - Symptomatic, <50% confined to bed  Vitals:   07/28/18 0845  BP: (!) 94/52  Pulse: 87  Resp: 18  Temp: 98.2 F (36.8 C)  SpO2: 93%   Filed Weights   07/28/18 0845  Weight: 123 lb (55.8 kg)    GENERAL:alert, no distress and comfortable SKIN: skin color, texture, turgor are normal, no rashes or significant lesions EYES: normal, Conjunctiva are pink and non-injected, sclera clear OROPHARYNX:no exudate, no erythema and lips, buccal mucosa, and tongue normal  NECK: supple, thyroid normal size, non-tender, without nodularity LYMPH:  no palpable lymphadenopathy in the cervical, axillary or inguinal LUNGS: clear to auscultation and percussion with normal breathing effort HEART: regular rate & rhythm and no murmurs with mild trace bilateral lower extremity edema ABDOMEN:abdomen soft, non-tender and normal bowel sounds Musculoskeletal:no cyanosis of digits and no clubbing.  She has tenderness at the olecranon area on the left elbow NEURO: alert & oriented x 3 with fluent speech, no focal motor/sensory deficits  LABORATORY DATA:  I have reviewed the data as listed    Component Value Date/Time   NA 139 07/28/2018 0810   K 4.5 07/28/2018 0810   CL 99 07/28/2018 0810   CO2 29  07/28/2018 0810   GLUCOSE 65 (L) 07/28/2018 0810   BUN 29 (H) 07/28/2018 0810   CREATININE 0.74 07/28/2018 0810   CALCIUM 9.7 07/28/2018 0810   PROT 7.8 07/28/2018 0810   ALBUMIN 2.4 (L) 07/28/2018 0810   AST 16 07/28/2018 0810   ALT 13 07/28/2018 0810   ALKPHOS 236 (H) 07/28/2018 0810   BILITOT 0.5 07/28/2018 0810   GFRNONAA >60 07/28/2018 0810   GFRAA >60 07/28/2018 0810    No results found for: SPEP, UPEP  Lab Results  Component Value Date   WBC 10.5 07/28/2018   NEUTROABS 4.3 07/28/2018   HGB 8.1 (L) 07/28/2018   HCT 25.7 (L) 07/28/2018   MCV 86.0 07/28/2018   PLT 87 (L) 07/28/2018      Chemistry      Component Value Date/Time   NA 139 07/28/2018 0810   K 4.5 07/28/2018 0810   CL 99 07/28/2018 0810   CO2 29 07/28/2018 0810   BUN 29 (H) 07/28/2018 0810   CREATININE 0.74 07/28/2018 0810      Component Value Date/Time   CALCIUM 9.7 07/28/2018 0810   ALKPHOS 236 (H) 07/28/2018 0810   AST 16 07/28/2018 0810   ALT 13 07/28/2018 0810   BILITOT 0.5 07/28/2018 0810       RADIOGRAPHIC STUDIES: I have personally reviewed the radiological images as listed and agreed with the findings in the report. Mr Jeri Cos Wo Contrast  Result Date: 07/18/2018 CLINICAL DATA:  Breast cancer. SRS restaging of solitary right occipital metastasis. EXAM: MRI HEAD WITHOUT AND WITH CONTRAST TECHNIQUE: Multiplanar, multiecho pulse sequences of the brain and surrounding structures were obtained without and with intravenous contrast. CONTRAST:  50m MULTIHANCE GADOBENATE DIMEGLUMINE 529 MG/ML IV SOLN COMPARISON:  03/16/2018 FINDINGS: BRAIN New Lesions: None. Larger lesions: None. Stable or Smaller lesions: 8 mm mm enhancing lesion located right occipital lobe and seen on 11:75. Associated vasogenic edema is also significantly diminished. Other Brain findings: No acute infarct, hemorrhage, hydrocephalus, or collection. Chronic blood products associated with the treated metastasis. Vascular: Major  flow voids are preserved Skull and upper cervical spine: New ill-defined enhancement at the level of the atlanto occipital membrane, where there was previously thin ligament and normal fat. Thickening measures  up to 12 mm on sagittal postcontrast imaging. The adjacent posterior ring of C1 marrow is abnormally hypointense, but not enhancing in similar fashion. Marrow is diffusely low signal. Sinuses/Orbits: Paranasal sinus surgery.  No acute sinusitis. IMPRESSION: 1. Regression of solitary right occipital lobe metastasis and edema. No new brain metastasis. 2. New ill-defined enhancement at the atlantooccipital membrane, presumably a metastatic focus. The adjacent bone marrow is abnormally low signal but not enhancing to the degree of the extraosseous lesion. Marrow signal is likely from anemia. Electronically Signed   By: Monte [name] M.D.   On: 07/18/2018 07:10    All questions were answered. The patient knows to call the clinic with any problems, questions or concerns. No barriers to learning was detected.  I spent 40 minutes counseling the patient face to face. The total time spent in the appointment was 55 minutes and more than 50% was on counseling and review of test results  [name], MD 07/28/2018 1:24 PM

## 2018-07-28 NOTE — Patient Instructions (Signed)
Tunneled Central Venous Catheter Flushing Guide  It is important to flush your tunneled central venous catheter each time you use it, both before and after you use it. Flushing your catheter will help prevent it from clogging. What are the risks? Risks may include:  Infection.  Air getting into the catheter and bloodstream. Supplies needed:  A clean pair of gloves.  A disinfecting wipe. Use an alcohol wipe, chlorhexidine wipe, or iodine wipe as told by your health care provider.  A 10 mL syringe that has been prefilled with saline solution.  An empty 10 mL syringe, if a substance called heparin was injected into your catheter. How to flush your catheter When you flush your catheter, make sure you follow any specific instructions from your health care provider or the manufacturer. These are general guidelines. Flushing your catheter before use If there is heparin in your catheter: 1. Wash your hands with soap and water. 2. Put on gloves. 3. Scrub the injection cap for a minimum of 15 seconds with a disinfecting wipe. 4. Unclamp the catheter. 5. Attach the empty syringe to the injection cap. 6. Pull the syringe plunger back and withdraw 10 mL of blood. 7. Place the syringe into an appropriate waste container. 8. Scrub the injection cap for 15 seconds with a disinfecting wipe. 9. Attach the prefilled syringe to the injection cap. 10. Flush the catheter by pushing the plunger forward until all the liquid from the syringe is in the catheter. 11. Remove the syringe from the injection cap. 12. Clamp the catheter. If there is no heparin in your catheter: 1. Wash your hands with soap and water. 2. Put on gloves. 3. Scrub the injection cap for 15 seconds with a disinfecting wipe. 4. Unclamp the catheter. 5. Attach the prefilled syringe to the injection cap. 6. Flush the catheter by pushing the plunger forward until 5 mL of the liquid from the syringe is in the catheter. 7. Pull back on  the syringe until you see blood in the catheter. 8. If you have been asked to collect any blood, follow your health care provider's instructions. Otherwise, flush the catheter with the rest of the solution from the syringe. 9. Remove the syringe from the injection cap. 10. Clamp the catheter.  Flushing your catheter after use 1. Wash your hands with soap and water. 2. Put on gloves. 3. Scrub the injection cap for 15 seconds with a disinfecting wipe. 4. Unclamp the catheter. 5. Attach the prefilled syringe to the injection cap. 6. Flush the catheter by pushing the plunger forward until all of the liquid from the syringe is in the catheter. 7. Remove the syringe from the injection cap. 8. Clamp the catheter. Problems and solutions  If blood cannot be completely cleared from the injection cap, you may need to have the injection cap replaced.  If the catheter is difficult to flush, use the pulsing method. The pulsing method involves pushing only a few milliliters of solution into the catheter at a time and pausing between pushes.  If you do not see blood in the catheter when you pull back on the syringe, change your body position, such as by raising your arms above your head. Take a deep breath and cough. Then, pull back on the syringe. If you still do not see blood, flush the catheter with a small amount of solution. Then, change positions again and take a breath or cough. Pull back on the syringe again. If you still do not see   blood, finish flushing the catheter and contact your health care provider. Do not use your catheter until your health care provider says it is okay. General tips  Have someone help you flush your catheter, if possible.  Do not force fluid through your catheter.  Do not use a syringe that is larger or smaller than 10 mL. Using a smaller syringe can make the catheter burst.  Do not use your catheter without flushing it first if it has heparin in it. Contact a health  care provider if:  You cannot see any blood in the catheter when you flush it before using it.  Your catheter is difficult to flush. Get help right away if:  You cannot flush the catheter.  The catheter leaks when you flush it or when there is fluid in it.  There are cracks or breaks in the catheter. Summary  It is important to flush your tunneled central venous catheter each time you use it, both before and after you use it.  Scrub the injection cap for 15 seconds with a disinfecting wipe before and after you flush it.  When you flush your catheter, make sure you follow any specific instructions from your health care provider or the manufacturer.  Get help right away if you cannot flush the catheter. This information is not intended to replace advice given to you by your health care provider. Make sure you discuss any questions you have with your health care provider. Document Released: 01/04/2011 Document Revised: 04/02/2018 Document Reviewed: 04/02/2018 Elsevier Patient Education  2020 [employer]  

## 2018-07-28 NOTE — Assessment & Plan Note (Signed)
We have presented her case at the brain tumor board recently Our plan is to observe with repeat serial imaging in 3 months

## 2018-07-31 ENCOUNTER — Other Ambulatory Visit: Payer: Self-pay

## 2018-07-31 ENCOUNTER — Telehealth: Payer: Self-pay

## 2018-07-31 ENCOUNTER — Other Ambulatory Visit: Payer: Self-pay | Admitting: Hematology and Oncology

## 2018-07-31 ENCOUNTER — Inpatient Hospital Stay: Payer: BC Managed Care – PPO | Attending: Hematology and Oncology

## 2018-07-31 DIAGNOSIS — Z794 Long term (current) use of insulin: Secondary | ICD-10-CM | POA: Diagnosis not present

## 2018-07-31 DIAGNOSIS — E43 Unspecified severe protein-calorie malnutrition: Secondary | ICD-10-CM | POA: Diagnosis not present

## 2018-07-31 DIAGNOSIS — D61818 Other pancytopenia: Secondary | ICD-10-CM

## 2018-07-31 DIAGNOSIS — Z171 Estrogen receptor negative status [ER-]: Secondary | ICD-10-CM | POA: Diagnosis not present

## 2018-07-31 DIAGNOSIS — C50912 Malignant neoplasm of unspecified site of left female breast: Secondary | ICD-10-CM | POA: Diagnosis not present

## 2018-07-31 DIAGNOSIS — Z79899 Other long term (current) drug therapy: Secondary | ICD-10-CM | POA: Diagnosis not present

## 2018-07-31 DIAGNOSIS — C7800 Secondary malignant neoplasm of unspecified lung: Secondary | ICD-10-CM | POA: Diagnosis not present

## 2018-07-31 DIAGNOSIS — E1165 Type 2 diabetes mellitus with hyperglycemia: Secondary | ICD-10-CM | POA: Diagnosis not present

## 2018-07-31 DIAGNOSIS — C7951 Secondary malignant neoplasm of bone: Secondary | ICD-10-CM | POA: Insufficient documentation

## 2018-07-31 DIAGNOSIS — Z7951 Long term (current) use of inhaled steroids: Secondary | ICD-10-CM | POA: Diagnosis not present

## 2018-07-31 DIAGNOSIS — C787 Secondary malignant neoplasm of liver and intrahepatic bile duct: Secondary | ICD-10-CM | POA: Diagnosis not present

## 2018-07-31 DIAGNOSIS — C50919 Malignant neoplasm of unspecified site of unspecified female breast: Secondary | ICD-10-CM

## 2018-07-31 DIAGNOSIS — D61810 Antineoplastic chemotherapy induced pancytopenia: Secondary | ICD-10-CM | POA: Insufficient documentation

## 2018-07-31 DIAGNOSIS — C50312 Malignant neoplasm of lower-inner quadrant of left female breast: Secondary | ICD-10-CM | POA: Diagnosis present

## 2018-07-31 DIAGNOSIS — C7931 Secondary malignant neoplasm of brain: Secondary | ICD-10-CM | POA: Diagnosis not present

## 2018-07-31 DIAGNOSIS — E860 Dehydration: Secondary | ICD-10-CM | POA: Insufficient documentation

## 2018-07-31 LAB — CBC WITH DIFFERENTIAL (CANCER CENTER ONLY)
Abs Immature Granulocytes: 2.47 10*3/uL — ABNORMAL HIGH (ref 0.00–0.07)
Basophils Absolute: 0.5 10*3/uL — ABNORMAL HIGH (ref 0.0–0.1)
Basophils Relative: 4 %
Eosinophils Absolute: 0 10*3/uL (ref 0.0–0.5)
Eosinophils Relative: 0 %
HCT: 25.1 % — ABNORMAL LOW (ref 36.0–46.0)
Hemoglobin: 7.7 g/dL — ABNORMAL LOW (ref 12.0–15.0)
Immature Granulocytes: 19 %
Lymphocytes Relative: 27 %
Lymphs Abs: 3.5 10*3/uL (ref 0.7–4.0)
MCH: 26.1 pg (ref 26.0–34.0)
MCHC: 30.7 g/dL (ref 30.0–36.0)
MCV: 85.1 fL (ref 80.0–100.0)
Monocytes Absolute: 1.4 10*3/uL — ABNORMAL HIGH (ref 0.1–1.0)
Monocytes Relative: 11 %
Neutro Abs: 5 10*3/uL (ref 1.7–7.7)
Neutrophils Relative %: 39 %
Platelet Count: 96 10*3/uL — ABNORMAL LOW (ref 150–400)
RBC: 2.95 MIL/uL — ABNORMAL LOW (ref 3.87–5.11)
RDW: 18.3 % — ABNORMAL HIGH (ref 11.5–15.5)
WBC Count: 12.9 10*3/uL — ABNORMAL HIGH (ref 4.0–10.5)
nRBC: 1.3 % — ABNORMAL HIGH (ref 0.0–0.2)

## 2018-07-31 LAB — CMP (CANCER CENTER ONLY)
ALT: 14 U/L (ref 0–44)
AST: 16 U/L (ref 15–41)
Albumin: 2.6 g/dL — ABNORMAL LOW (ref 3.5–5.0)
Alkaline Phosphatase: 199 U/L — ABNORMAL HIGH (ref 38–126)
Anion gap: 13 (ref 5–15)
BUN: 19 mg/dL (ref 6–20)
CO2: 27 mmol/L (ref 22–32)
Calcium: 9.3 mg/dL (ref 8.9–10.3)
Chloride: 99 mmol/L (ref 98–111)
Creatinine: 0.7 mg/dL (ref 0.44–1.00)
GFR, Est AFR Am: >60 mL/min (ref >60)
GFR, Estimated: >60 mL/min (ref >60)
Glucose, Bld: 105 mg/dL — ABNORMAL HIGH (ref 70–99)
Potassium: 4 mmol/L (ref 3.5–5.1)
Sodium: 139 mmol/L (ref 135–145)
Total Bilirubin: 0.5 mg/dL (ref 0.3–1.2)
Total Protein: 8 g/dL (ref 6.5–8.1)

## 2018-07-31 LAB — SAMPLE TO BLOOD BANK

## 2018-07-31 LAB — PREPARE RBC (CROSSMATCH)

## 2018-07-31 NOTE — Telephone Encounter (Signed)
Per Dr. Alvy Bimler given lab results from today. Given appt for 8:15 for tomorrow for 1 unit of blood and instructed to keep blood bracelet on. She verbalized understanding.

## 2018-08-01 ENCOUNTER — Other Ambulatory Visit: Payer: Self-pay

## 2018-08-01 ENCOUNTER — Inpatient Hospital Stay: Payer: BC Managed Care – PPO

## 2018-08-01 DIAGNOSIS — C50912 Malignant neoplasm of unspecified site of left female breast: Secondary | ICD-10-CM | POA: Diagnosis not present

## 2018-08-01 DIAGNOSIS — D61818 Other pancytopenia: Secondary | ICD-10-CM

## 2018-08-01 MED ORDER — DIPHENHYDRAMINE HCL 25 MG PO CAPS
25.0000 mg | ORAL_CAPSULE | Freq: Once | ORAL | Status: AC
  Administered 2018-08-01: 25 mg via ORAL

## 2018-08-01 MED ORDER — DIPHENHYDRAMINE HCL 25 MG PO CAPS
ORAL_CAPSULE | ORAL | Status: AC
  Filled 2018-08-01: qty 1

## 2018-08-01 MED ORDER — HEPARIN SOD (PORK) LOCK FLUSH 100 UNIT/ML IV SOLN
500.0000 [IU] | Freq: Every day | INTRAVENOUS | Status: AC | PRN
  Administered 2018-08-01: 12:00:00 500 [IU]
  Filled 2018-08-01: qty 5

## 2018-08-01 MED ORDER — SODIUM CHLORIDE 0.9% FLUSH
3.0000 mL | INTRAVENOUS | Status: DC | PRN
  Filled 2018-08-01: qty 10

## 2018-08-01 MED ORDER — HEPARIN SOD (PORK) LOCK FLUSH 100 UNIT/ML IV SOLN
250.0000 [IU] | INTRAVENOUS | Status: DC | PRN
  Filled 2018-08-01: qty 5

## 2018-08-01 MED ORDER — ACETAMINOPHEN 325 MG PO TABS
ORAL_TABLET | ORAL | Status: AC
  Filled 2018-08-01: qty 2

## 2018-08-01 MED ORDER — ACETAMINOPHEN 325 MG PO TABS
650.0000 mg | ORAL_TABLET | Freq: Once | ORAL | Status: AC
  Administered 2018-08-01: 08:00:00 650 mg via ORAL

## 2018-08-01 MED ORDER — SODIUM CHLORIDE 0.9% IV SOLUTION
250.0000 mL | Freq: Once | INTRAVENOUS | Status: AC
  Administered 2018-08-01: 250 mL via INTRAVENOUS
  Filled 2018-08-01: qty 250

## 2018-08-01 MED ORDER — SODIUM CHLORIDE 0.9% FLUSH
10.0000 mL | INTRAVENOUS | Status: AC | PRN
  Administered 2018-08-01: 12:00:00 10 mL
  Filled 2018-08-01: qty 10

## 2018-08-01 NOTE — Patient Instructions (Signed)
Blood Transfusion, Adult A blood transfusion is a procedure in which you are given blood through an IV tube. You may need this procedure because of:  Illness.  Surgery.  Injury. The blood may come from someone else (a donor). You may also be able to donate blood for yourself (autologous blood donation). The blood given in a transfusion is made up of different types of cells. You may get:  Red blood cells. These carry oxygen to the cells in the body.  White blood cells. These help you fight infections.  Platelets. These help your blood to clot.  Plasma. This is the liquid part of your blood. It helps with fluid imbalances. If you have a clotting disorder, you may also get other types of blood products. What happens before the procedure?  You will have a blood test to find out your blood type. The test also finds out what type of blood your body will accept and matches it to the donor type.  If you are going to have a planned surgery, you may be able to donate your own blood. This may be done in case you need a transfusion.  If you have had an allergic reaction to a transfusion in the past, you may be given medicine to help prevent a reaction. This medicine may be given to you by mouth or through an IV.  You will have your temperature, blood pressure, and pulse checked.  Follow instructions from your doctor about what you cannot eat or drink.  Ask your doctor about: ? Changing or stopping your regular medicines. This is important if you take diabetes medicines or blood thinners. ? Taking medicines such as aspirin and ibuprofen. These medicines can thin your blood. Do not take these medicines before your procedure if your doctor tells you not to. What happens during the procedure?  An IV tube will be put into one of your veins.  The bag of donated blood will be attached to your IV tube. Then, the blood will enter through your vein.  Your temperature, blood pressure, and pulse will  be checked regularly during the procedure. This is done to find early signs of a transfusion reaction.  If you have any signs or symptoms of a reaction, your transfusion will be stopped. You may also be given medicine.  When the transfusion is done, your IV tube will be taken out.  Pressure may be applied to the IV site for a few minutes.  A bandage (dressing) will be put on the IV site. The procedure may vary among doctors and hospitals. What happens after the procedure?  Your temperature, blood pressure, heart rate, breathing rate, and blood oxygen level will be checked often.  Your blood may be tested to see how you are responding to the transfusion.  You may be warmed with fluids or blankets. This is done to keep the temperature of your body normal. Summary  A blood transfusion is a procedure in which you are given blood through an IV tube.  The blood may come from someone else (a donor). You may also be able to donate blood for yourself.  If you have had an allergic reaction to a transfusion in the past, you may be given medicine to help prevent a reaction. This medicine may be given to you by mouth or through an IV tube.  Your temperature, blood pressure, heart rate, breathing rate, and blood oxygen level will be checked often.  Your blood may be tested to see   how you are responding to the transfusion. This information is not intended to replace advice given to you by your health care provider. Make sure you discuss any questions you have with your health care provider. Document Released: 04/13/2008 Document Revised: 03/07/2016 Document Reviewed: 09/09/2015 Elsevier Patient Education  2020 Elsevier Inc.  

## 2018-08-02 LAB — BPAM RBC
Blood Product Expiration Date: 202007182359
ISSUE DATE / TIME: 202007030909
Unit Type and Rh: 6200

## 2018-08-02 LAB — TYPE AND SCREEN
ABO/RH(D): A POS
Antibody Screen: NEGATIVE
Unit division: 0

## 2018-08-04 ENCOUNTER — Other Ambulatory Visit: Payer: Self-pay | Admitting: Hematology and Oncology

## 2018-08-04 ENCOUNTER — Telehealth: Payer: Self-pay

## 2018-08-04 DIAGNOSIS — C7951 Secondary malignant neoplasm of bone: Secondary | ICD-10-CM

## 2018-08-04 DIAGNOSIS — J4540 Moderate persistent asthma, uncomplicated: Secondary | ICD-10-CM

## 2018-08-04 MED ORDER — MORPHINE SULFATE 15 MG PO TABS: 15.0000 mg | ORAL_TABLET | ORAL | 0 refills | Status: DC | PRN

## 2018-08-04 MED ORDER — MONTELUKAST SODIUM 10 MG PO TABS: 10.0000 mg | ORAL_TABLET | Freq: Every day | ORAL | 1 refills | Status: AC

## 2018-08-04 NOTE — Telephone Encounter (Signed)
She called and left a message. Asking for a refill on Morphine 15 mg and Singular 10 mg, she is asking for a 90 day Rx on the Singular.

## 2018-08-04 NOTE — Telephone Encounter (Signed)
done

## 2018-08-06 ENCOUNTER — Inpatient Hospital Stay: Payer: BC Managed Care – PPO

## 2018-08-06 ENCOUNTER — Inpatient Hospital Stay (HOSPITAL_BASED_OUTPATIENT_CLINIC_OR_DEPARTMENT_OTHER): Payer: BC Managed Care – PPO | Admitting: Hematology and Oncology

## 2018-08-06 ENCOUNTER — Other Ambulatory Visit: Payer: Self-pay

## 2018-08-06 DIAGNOSIS — C7931 Secondary malignant neoplasm of brain: Secondary | ICD-10-CM

## 2018-08-06 DIAGNOSIS — C50919 Malignant neoplasm of unspecified site of unspecified female breast: Secondary | ICD-10-CM

## 2018-08-06 DIAGNOSIS — C787 Secondary malignant neoplasm of liver and intrahepatic bile duct: Secondary | ICD-10-CM

## 2018-08-06 DIAGNOSIS — C7951 Secondary malignant neoplasm of bone: Secondary | ICD-10-CM

## 2018-08-06 DIAGNOSIS — Z794 Long term (current) use of insulin: Secondary | ICD-10-CM

## 2018-08-06 DIAGNOSIS — E440 Moderate protein-calorie malnutrition: Secondary | ICD-10-CM

## 2018-08-06 DIAGNOSIS — E1165 Type 2 diabetes mellitus with hyperglycemia: Secondary | ICD-10-CM

## 2018-08-06 DIAGNOSIS — Z79899 Other long term (current) drug therapy: Secondary | ICD-10-CM

## 2018-08-06 DIAGNOSIS — E860 Dehydration: Secondary | ICD-10-CM

## 2018-08-06 DIAGNOSIS — C50912 Malignant neoplasm of unspecified site of left female breast: Secondary | ICD-10-CM

## 2018-08-06 DIAGNOSIS — E43 Unspecified severe protein-calorie malnutrition: Secondary | ICD-10-CM

## 2018-08-06 DIAGNOSIS — Z7951 Long term (current) use of inhaled steroids: Secondary | ICD-10-CM

## 2018-08-06 DIAGNOSIS — D61818 Other pancytopenia: Secondary | ICD-10-CM

## 2018-08-06 DIAGNOSIS — C7800 Secondary malignant neoplasm of unspecified lung: Secondary | ICD-10-CM

## 2018-08-06 DIAGNOSIS — D61810 Antineoplastic chemotherapy induced pancytopenia: Secondary | ICD-10-CM

## 2018-08-06 DIAGNOSIS — R5381 Other malaise: Secondary | ICD-10-CM

## 2018-08-06 DIAGNOSIS — Z171 Estrogen receptor negative status [ER-]: Secondary | ICD-10-CM

## 2018-08-06 LAB — CMP (CANCER CENTER ONLY)
ALT: 11 U/L (ref 0–44)
AST: 17 U/L (ref 15–41)
Albumin: 2.7 g/dL — ABNORMAL LOW (ref 3.5–5.0)
Alkaline Phosphatase: 156 U/L — ABNORMAL HIGH (ref 38–126)
Anion gap: 10 (ref 5–15)
BUN: 20 mg/dL (ref 6–20)
CO2: 28 mmol/L (ref 22–32)
Calcium: 9.3 mg/dL (ref 8.9–10.3)
Chloride: 98 mmol/L (ref 98–111)
Creatinine: 0.7 mg/dL (ref 0.44–1.00)
GFR, Est AFR Am: >60 mL/min (ref >60)
GFR, Estimated: >60 mL/min (ref >60)
Glucose, Bld: 77 mg/dL (ref 70–99)
Potassium: 4.6 mmol/L (ref 3.5–5.1)
Sodium: 136 mmol/L (ref 135–145)
Total Bilirubin: 0.5 mg/dL (ref 0.3–1.2)
Total Protein: 7.8 g/dL (ref 6.5–8.1)

## 2018-08-06 LAB — CBC WITH DIFFERENTIAL (CANCER CENTER ONLY)
Abs Immature Granulocytes: 1.42 10*3/uL — ABNORMAL HIGH (ref 0.00–0.07)
Basophils Absolute: 0.3 10*3/uL — ABNORMAL HIGH (ref 0.0–0.1)
Basophils Relative: 3 %
Eosinophils Absolute: 0 10*3/uL (ref 0.0–0.5)
Eosinophils Relative: 0 %
HCT: 26.4 % — ABNORMAL LOW (ref 36.0–46.0)
Hemoglobin: 8.3 g/dL — ABNORMAL LOW (ref 12.0–15.0)
Immature Granulocytes: 12 %
Lymphocytes Relative: 21 %
Lymphs Abs: 2.5 10*3/uL (ref 0.7–4.0)
MCH: 26.4 pg (ref 26.0–34.0)
MCHC: 31.4 g/dL (ref 30.0–36.0)
MCV: 84.1 fL (ref 80.0–100.0)
Monocytes Absolute: 2.1 10*3/uL — ABNORMAL HIGH (ref 0.1–1.0)
Monocytes Relative: 18 %
Neutro Abs: 5.6 10*3/uL (ref 1.7–7.7)
Neutrophils Relative %: 46 %
Platelet Count: 86 10*3/uL — ABNORMAL LOW (ref 150–400)
RBC: 3.14 MIL/uL — ABNORMAL LOW (ref 3.87–5.11)
RDW: 18 % — ABNORMAL HIGH (ref 11.5–15.5)
WBC Count: 11.9 10*3/uL — ABNORMAL HIGH (ref 4.0–10.5)
nRBC: 0.8 % — ABNORMAL HIGH (ref 0.0–0.2)

## 2018-08-06 LAB — SAMPLE TO BLOOD BANK

## 2018-08-06 MED ORDER — SODIUM CHLORIDE 0.9% FLUSH
10.0000 mL | Freq: Once | INTRAVENOUS | Status: AC
  Administered 2018-08-06: 10:00:00 10 mL
  Filled 2018-08-06: qty 10

## 2018-08-06 NOTE — Patient Instructions (Signed)

## 2018-08-07 ENCOUNTER — Encounter: Payer: Self-pay | Admitting: Hematology and Oncology

## 2018-08-07 ENCOUNTER — Telehealth: Payer: Self-pay

## 2018-08-07 ENCOUNTER — Other Ambulatory Visit: Payer: Self-pay | Admitting: Hematology and Oncology

## 2018-08-07 DIAGNOSIS — N390 Urinary tract infection, site not specified: Secondary | ICD-10-CM

## 2018-08-07 MED ORDER — CIPROFLOXACIN HCL 250 MG PO TABS: 250.0000 mg | ORAL_TABLET | Freq: Two times a day (BID) | ORAL | 0 refills | Status: DC

## 2018-08-07 NOTE — Assessment & Plan Note (Signed)
She continues to feel weak but is able to participate with home physical therapy She will continue the same

## 2018-08-07 NOTE — Assessment & Plan Note (Signed)
Her serum albumin is improving She will continue to increase oral intake as tolerated

## 2018-08-07 NOTE — Telephone Encounter (Signed)
She called and left a message.  Called back. She had a accident in her depends 3 days ago and accidentally got bm to her front side. She now is having UTI symptoms. Burning, frequent urination and heavy feeling when she urinates. She started a low grade temperature yesterday and it is 100. now. She vomited her dinner yesterday and during the night had another episode. No vomiting today and she was able to drink her Glucerna this morning.  Requesting antibiotic called into [facility] in [facility].

## 2018-08-07 NOTE — Assessment & Plan Note (Signed)
She was transfused last week Her blood counts are stable today She does not need transfusion support

## 2018-08-07 NOTE — Progress Notes (Signed)
[place] OFFICE PROGRESS NOTE  Patient Care Team: Robyne Peers, MD as PCP - General (Family Medicine) Elouise Munroe, MD as PCP - Cardiology (Cardiology)  ASSESSMENT & PLAN:  Metastatic breast cancer [facility]) She tolerated recent chemotherapy treatment well without major side effects Overall, she is improving clinically I will continue to see her on a weekly basis for supportive care  Pancytopenia, acquired [facility]) She was transfused last week Her blood counts are stable today She does not need transfusion support  Physical debility She continues to feel weak but is able to participate with home physical therapy She will continue the same  Malnutrition of moderate degree Her serum albumin is improving She will continue to increase oral intake as tolerated   No orders of the defined types were placed in this encounter.   INTERVAL HISTORY: Please see below for problem oriented charting. She returns for further follow-up She tolerated recent chemotherapy well Denies infusion reaction Her blood sugar at home is stable She feels strong after transfusion last week She is able to participate with physical therapy and rehab Denies cough, chest pain or shortness of breath   SUMMARY OF ONCOLOGIC HISTORY: Oncology History Overview Note  Biopsy is ER negative Her2/neu positive   Metastatic breast cancer ([place])  03/11/2018 Imaging   Ct scan of chest, abdomen and pelvis 1. 2.6 x 2.3 cm spiculated mass identified inferomedial quadrant of the left breast. Lesion appears to retract the overlying skin. Mammographic correlation recommended. 2. Small lymph nodes in the left axillar ill-defined and suspicious for metastatic disease. 3. Multiple bilateral pulmonary nodules measuring up to 2.7 cm diameter. These probably represent metastatic disease. Given the dominant size of the central right upper lobe lesion, synchronous lung primary can not be completely  excluded. 4. Multiple ill-defined liver lesions compatible with metastatic disease. Dominant liver metastases measure up to almost 4 cm. 5.  Aortic Atherosclerois (ICD10-170.0)    03/12/2018 Pathology Results   Liver, needle/core biopsy, left lobe - METASTATIC CARCINOMA. - LYMPHOVASCULAR INVASION IS IDENTIFIED. - SEE COMMENT. Microscopic Comment The tumor cells are positive for cytokeratin 7. There is faint staining for GATA-3. There is non-specific staining for TTF-1. Cytokeratin 20, CDX-2, estrogen receptor, and GCDFP stains are negative. Given the clinical suspicion, the profile supports a primary breast carcinoma. Her2 will be performed and the results reported separately.  By immunohistochemistry, the tumor cells are POSITIVE for Her2 (3+).   03/12/2018 Imaging   Single 1.3 x 1.4 cm RIGHT occipital metastasis.  Borderline pachymeningeal enhancement of symmetric nature could be related to the superficial metastasis or osseous disease.   03/12/2018 Procedure   Ultrasound-guided core biopsy performed of a mass within the lateral segment of the left lobe of the liver.   03/16/2018 Imaging   Right occipital lobe 13 x 16 mm. Small satellite enhancing nodule deep to the larger lesion. The larger lesion shows central necrosis and probable mild hemorrhage or calcification.  Mild dural enhancement is less impressive and could be within normal limits.  Bone marrow in the clivus and cervical spine is diffusely low signal. No focal lesion. This may be related to the patient's anemia and abnormal blood count.   03/17/2018 Cancer Staging   Staging form: Breast, AJCC 8th Edition - Clinical: Stage IV (cT2, cN0, pM1, GX, ER-, PR: Not Assessed, HER2+) - Signed by [name], MD on 03/17/2018   03/19/2018 -  Chemotherapy   She received chemo with Taxotere, Herceptin and Perjeta   03/21/2018  Echocardiogram   IMPRESSIONS 1. The left ventricle has normal systolic function with an ejection fraction  of 60-65%. The cavity size was normal. Left ventricular diastolic Doppler parameters are consistent with impaired relaxation.  2. The right ventricle has normal systolic function. The cavity was normal. There is no increase in right ventricular wall thickness.  3. The mitral valve is normal in structure.  4. The tricuspid valve is normal in structure.  5. Strain imaging performed but not reported due to interpreter judgement, secondary to suboptimal image quality.   03/24/2018 Procedure   Successful placement of a right IJ approach Power Port with ultrasound and fluoroscopic guidance. The catheter is ready for use.   04/07/2018 - 04/16/2018 Hospital Admission   She was admitted for pneumonia   04/07/2018 - 04/16/2018 Hospital Admission   She was admitted to the hospital for pneumonia and respiratory failure   04/25/2018 Imaging   Retropharyngeal fluid collection compatible with effusion or possibly abscess. Soft tissue thickening extends into the right neck and surrounds the right carotid bifurcation and right internal carotid artery. There is also streaky density in the right lateral neck soft tissues with a focal 7.6 Mm lymph node in the right lateral neck. Stranding surrounds the right submandibular gland.   Findings are most compatible with infection. Favor pharyngitis with retropharyngeal effusion/abscess. Soft tissue thickening around the right carotid most compatible with infection rather than tumor. Right jugular vein is patent.  Multiple nodular densities in the right upper lobe may represent residual pneumonia or metastatic disease. Interval improvement in right upper lobe infiltrate compared with CT of 04/08/2018   04/25/2018 - 05/06/2018 Hospital Admission   She was admitted for retropharyngeal abscess   04/27/2018 Imaging   No evidence of large central pulmonary embolus is noted in the main pulmonary artery or the main portions of the right and left pulmonary arteries. However,  evaluation of the lower lobe branches, particularly on the right, is limited due to respiratory motion artifact and other limiting issues. Pulmonary emboli and smaller peripheral branches can not be excluded on the basis of this exam.  Large left pleural effusion is noted with complete atelectasis of the left lower lobe. Increased left upper lobe opacity is noted posteriorly concerning for atelectasis or pneumonia.  Improved right upper lobe and lower lobe opacities are noted suggesting improving pneumonia or atelectasis.  15 mm right paratracheal lymph node is noted which is enlarged compared to prior exam; it is uncertain if this is metastatic or inflammatory in etiology.  Hepatic metastatic lesions are again noted.  Aortic Atherosclerosis (ICD10-I70.0).    04/27/2018 Procedure   Successful ultrasound guided left thoracentesis yielding 120 mL of blood-tinged of pleural fluid.   06/11/2018 Imaging   1. No evidence of pulmonary emboli. 2. Extensive bilateral lower lobe consolidation. 3. Small pleural effusions. 4. 2.5 cm right upper lobe nodule, enlarged from 04/27/2018 and which may reflect a metastasis or primary lung cancer. Stable to mild enlargement of subcentimeter nodules in both upper lobes consistent with known metastases. 5. Decreased size of liver metastases. 6. Unchanged mild mediastinal and right hilar lymphadenopathy. 7. Aortic Atherosclerosis (ICD10-I70.0).   06/11/2018 - 06/22/2018 Hospital Admission   She was hospitalized for infection and SVT   07/18/2018 Imaging   MRI brain 1. Regression of solitary right occipital lobe metastasis and edema. No new brain metastasis. 2. New ill-defined enhancement at the atlantooccipital membrane, presumably a metastatic focus. The adjacent bone marrow is abnormally low signal but not enhancing to the  degree of the extraosseous lesion. Marrow signal is likely from anemia.     Metastasis to brain ([place])  03/17/2018 Initial  Diagnosis   Metastasis to brain [facility])   Metastasis to liver ([place])  03/17/2018 Initial Diagnosis   Metastasis to liver [facility])   Metastasis to bone ([place])  03/17/2018 Initial Diagnosis   Metastasis to bone ([facility])     REVIEW OF SYSTEMS:   Constitutional: Denies fevers, chills or abnormal weight loss Eyes: Denies blurriness of vision Ears, nose, mouth, throat, and face: Denies mucositis or sore throat Respiratory: Denies cough, dyspnea or wheezes Cardiovascular: Denies palpitation, chest discomfort or lower extremity swelling Gastrointestinal:  Denies nausea, heartburn or change in bowel habits Skin: Denies abnormal skin rashes Lymphatics: Denies new lymphadenopathy or easy bruising Neurological:Denies numbness, tingling or new weaknesses Behavioral/Psych: Mood is stable, no new changes  All other systems were reviewed with the patient and are negative.  I have reviewed the past medical history, past surgical history, social history and family history with the patient and they are unchanged from previous note.  ALLERGIES:  is allergic to nsaids.  MEDICATIONS:  Current Outpatient Medications  Medication Sig Dispense Refill  . ACCU-CHEK AVIVA PLUS test strip     . ACCU-CHEK SOFTCLIX LANCETS lancets     . acetaminophen (TYLENOL) 325 MG tablet Take 2 tablets (650 mg total) by mouth every 6 (six) hours as needed for mild pain (or Fever >/= 101). 30 tablet 0  . albuterol (PROAIR HFA) 108 (90 Base) MCG/ACT inhaler Inhale 2 puffs into the lungs every 6 (six) hours as needed for wheezing or shortness of breath. 1 Inhaler 1  . amiodarone (PACERONE) 200 MG tablet Take 1 tablet (200 mg total) by mouth daily. 30 tablet 0  . benzonatate (TESSALON) 200 MG capsule Take 1 capsule (200 mg total) by mouth 3 (three) times daily as needed for cough (use second). 20 capsule 0  . blood glucose meter kit and supplies KIT Dispense based on patient and insurance preference. Use up to four times daily as  directed. (FOR ICD-9 250.00, 250.01). 1 each 0  . esomeprazole (NEXIUM) 40 MG capsule Take 1 capsule (40 mg total) by mouth daily at 12 noon. 90 capsule 3  . feeding supplement, ENSURE ENLIVE, (ENSURE ENLIVE) LIQD Take 237 mLs by mouth 3 (three) times daily between meals. 237 mL 12  . fluticasone (FLOVENT HFA) 44 MCG/ACT inhaler Inhale 2 puffs into the lungs 2 (two) times daily. 3 Inhaler 1  . insulin aspart (NOVOLOG) 100 UNIT/ML FlexPen Inject 8 Units into the skin 3 (three) times daily with meals. (Patient taking differently: Inject 8 Units into the skin 3 (three) times daily with meals. Holds dose if not going to eat.) 15 mL 11  . Insulin Detemir (LEVEMIR) 100 UNIT/ML Pen Inject 10 Units into the skin daily. (Patient taking differently: Inject 10 Units into the skin at bedtime. ) 15 mL 0  . Insulin Pen Needle 31G X 5 MM MISC 1 Device by Does not apply route QID. For use with insulin pens 100 each 0  . lidocaine-prilocaine (EMLA) cream Apply to affected area once (Patient taking differently: Apply 1 application topically as needed (port access). Apply to affected area once) 30 g 3  . LORazepam (ATIVAN) 0.5 MG tablet Take 1 tab po 30 minutes prior to radiation or MRI (Patient taking differently: Take 0.5 mg by mouth See admin instructions. Take 30 minutes prior to radiation or MRI) 30 tablet 0  . metFORMIN (  GLUCOPHAGE) 500 MG tablet Take 500 mg by mouth daily with breakfast.     . metoprolol tartrate (LOPRESSOR) 25 MG tablet Take 0.5 tablets (12.5 mg total) by mouth 2 (two) times daily. 60 tablet 0  . mirtazapine (REMERON) 30 MG tablet Take 1 tablet (30 mg total) by mouth at bedtime. 60 tablet 9  . montelukast (SINGULAIR) 10 MG tablet Take 1 tablet (10 mg total) by mouth daily. 90 tablet 1  . morphine (MSIR) 15 MG tablet Take 1 tablet (15 mg total) by mouth every 4 (four) hours as needed for severe pain. 60 tablet 0  . Multiple Vitamin (MULTIVITAMIN WITH MINERALS) TABS tablet Take 1 tablet by mouth  daily.    . ondansetron (ZOFRAN) 8 MG tablet Take 8 mg by mouth every 8 (eight) hours as needed for nausea or vomiting.     . prochlorperazine (COMPAZINE) 10 MG tablet Take 10 mg by mouth every 6 (six) hours as needed for nausea or vomiting.      No current facility-administered medications for this visit.    Facility-Administered Medications Ordered in Other Visits  Medication Dose Route Frequency Provider Last Rate Last Dose  . heparin lock flush 100 unit/mL  250 Units Intracatheter PRN , , MD      . sodium chloride flush (NS) 0.9 % injection 3 mL  3 mL Intracatheter PRN Alvy Bimler, , MD        PHYSICAL EXAMINATION: ECOG PERFORMANCE STATUS: 1 - Symptomatic but completely ambulatory  Vitals:   08/06/18 1049  BP: (!) 104/49  Pulse: 94  Resp: 18  Temp: 98.2 F (36.8 C)  SpO2: 98%   Filed Weights   08/06/18 1049  Weight: 123 lb (55.8 kg)    GENERAL:alert, no distress and comfortable SKIN: skin color, texture, turgor are normal, no rashes or significant lesions EYES: normal, Conjunctiva are pink and non-injected, sclera clear OROPHARYNX:no exudate, no erythema and lips, buccal mucosa, and tongue normal  NECK: supple, thyroid normal size, non-tender, without nodularity LYMPH:  no palpable lymphadenopathy in the cervical, axillary or inguinal LUNGS: clear to auscultation and percussion with normal breathing effort HEART: regular rate & rhythm and no murmurs with mild bilateral lower extremity edema ABDOMEN:abdomen soft, non-tender and normal bowel sounds Musculoskeletal:no cyanosis of digits and no clubbing  NEURO: alert & oriented x 3 with fluent speech, no focal motor/sensory deficits  LABORATORY DATA:  I have reviewed the data as listed    Component Value Date/Time   NA 136 08/06/2018 0928   K 4.6 08/06/2018 0928   CL 98 08/06/2018 0928   CO2 28 08/06/2018 0928   GLUCOSE 77 08/06/2018 0928   BUN 20 08/06/2018 0928   CREATININE 0.70 08/06/2018 0928   CALCIUM  9.3 08/06/2018 0928   PROT 7.8 08/06/2018 0928   ALBUMIN 2.7 (L) 08/06/2018 0928   AST 17 08/06/2018 0928   ALT 11 08/06/2018 0928   ALKPHOS 156 (H) 08/06/2018 0928   BILITOT 0.5 08/06/2018 0928   GFRNONAA >60 08/06/2018 0928   GFRAA >60 08/06/2018 0928    No results found for: SPEP, UPEP  Lab Results  Component Value Date   WBC 11.9 (H) 08/06/2018   NEUTROABS 5.6 08/06/2018   HGB 8.3 (L) 08/06/2018   HCT 26.4 (L) 08/06/2018   MCV 84.1 08/06/2018   PLT 86 (L) 08/06/2018      Chemistry      Component Value Date/Time   NA 136 08/06/2018 0928   K 4.6 08/06/2018 2778  CL 98 08/06/2018 0928   CO2 28 08/06/2018 0928   BUN 20 08/06/2018 0928   CREATININE 0.70 08/06/2018 0928      Component Value Date/Time   CALCIUM 9.3 08/06/2018 0928   ALKPHOS 156 (H) 08/06/2018 0928   AST 17 08/06/2018 0928   ALT 11 08/06/2018 0928   BILITOT 0.5 08/06/2018 0928       RADIOGRAPHIC STUDIES: I have personally reviewed the radiological images as listed and agreed with the findings in the report. Mr Jeri Cos Wo Contrast  Result Date: 07/18/2018 CLINICAL DATA:  Breast cancer. SRS restaging of solitary right occipital metastasis. EXAM: MRI HEAD WITHOUT AND WITH CONTRAST TECHNIQUE: Multiplanar, multiecho pulse sequences of the brain and surrounding structures were obtained without and with intravenous contrast. CONTRAST:  65m MULTIHANCE GADOBENATE DIMEGLUMINE 529 MG/ML IV SOLN COMPARISON:  03/16/2018 FINDINGS: BRAIN New Lesions: None. Larger lesions: None. Stable or Smaller lesions: 8 mm mm enhancing lesion located right occipital lobe and seen on 11:75. Associated vasogenic edema is also significantly diminished. Other Brain findings: No acute infarct, hemorrhage, hydrocephalus, or collection. Chronic blood products associated with the treated metastasis. Vascular: Major flow voids are preserved Skull and upper cervical spine: New ill-defined enhancement at the level of the atlanto occipital  membrane, where there was previously thin ligament and normal fat. Thickening measures up to 12 mm on sagittal postcontrast imaging. The adjacent posterior ring of C1 marrow is abnormally hypointense, but not enhancing in similar fashion. Marrow is diffusely low signal. Sinuses/Orbits: Paranasal sinus surgery.  No acute sinusitis. IMPRESSION: 1. Regression of solitary right occipital lobe metastasis and edema. No new brain metastasis. 2. New ill-defined enhancement at the atlantooccipital membrane, presumably a metastatic focus. The adjacent bone marrow is abnormally low signal but not enhancing to the degree of the extraosseous lesion. Marrow signal is likely from anemia. Electronically Signed   By: JMonte FantasiaM.D.   On: 07/18/2018 07:10    All questions were answered. The patient knows to call the clinic with any problems, questions or concerns. No barriers to learning was detected.  I spent 15 minutes counseling the patient face to face. The total time spent in the appointment was 20 minutes and more than 50% was on counseling and review of test results  NHeath Lark MD 08/07/2018 8:49 AM

## 2018-08-07 NOTE — Assessment & Plan Note (Signed)
She tolerated recent chemotherapy treatment well without major side effects Overall, she is improving clinically I will continue to see her on a weekly basis for supportive care

## 2018-08-07 NOTE — Telephone Encounter (Signed)
Called back per Dr. Alvy Bimler, instructed to start antibiotic today as soon as she picks it up and another before bed tonight. Tomorrow to take Rx as prescribed. Normally Dr. Alvy Bimler does not send Rx for antibiotic to pharmacy but she will make exception today only. She verbalized understanding and said thank you.   Called [facility] and ask them to make a visit tomorrow. Talked with Anderson Malta with [facility] at [phone] ask if they could make a visit. They will go out tomorrow for assessment and evaluation. Called [place] back and told her [facility] would make a visit tomorrow. She verbalized understanding.

## 2018-08-08 ENCOUNTER — Telehealth: Payer: Self-pay

## 2018-08-08 NOTE — Telephone Encounter (Signed)
PT with [facility] called and left a message. Stacy Shelton refused PT this week due to not feeling good. Just FYI.

## 2018-08-12 ENCOUNTER — Other Ambulatory Visit: Payer: Self-pay | Admitting: Hematology and Oncology

## 2018-08-13 ENCOUNTER — Inpatient Hospital Stay: Payer: BC Managed Care – PPO

## 2018-08-13 ENCOUNTER — Other Ambulatory Visit: Payer: Self-pay

## 2018-08-13 ENCOUNTER — Encounter: Payer: Self-pay | Admitting: Hematology and Oncology

## 2018-08-13 ENCOUNTER — Telehealth: Payer: Self-pay | Admitting: *Deleted

## 2018-08-13 ENCOUNTER — Inpatient Hospital Stay (HOSPITAL_BASED_OUTPATIENT_CLINIC_OR_DEPARTMENT_OTHER): Payer: BC Managed Care – PPO | Admitting: Hematology and Oncology

## 2018-08-13 ENCOUNTER — Other Ambulatory Visit: Payer: Self-pay | Admitting: Hematology and Oncology

## 2018-08-13 ENCOUNTER — Other Ambulatory Visit: Payer: Self-pay | Admitting: *Deleted

## 2018-08-13 VITALS — BP 96/41 | HR 117 | Temp 98.4°F | Resp 21 | Ht 62.0 in

## 2018-08-13 VITALS — BP 98/59 | HR 94 | Temp 97.6°F | Resp 18

## 2018-08-13 DIAGNOSIS — C7951 Secondary malignant neoplasm of bone: Secondary | ICD-10-CM

## 2018-08-13 DIAGNOSIS — T451X5A Adverse effect of antineoplastic and immunosuppressive drugs, initial encounter: Secondary | ICD-10-CM

## 2018-08-13 DIAGNOSIS — D6481 Anemia due to antineoplastic chemotherapy: Secondary | ICD-10-CM

## 2018-08-13 DIAGNOSIS — R112 Nausea with vomiting, unspecified: Secondary | ICD-10-CM

## 2018-08-13 DIAGNOSIS — Z79899 Other long term (current) drug therapy: Secondary | ICD-10-CM

## 2018-08-13 DIAGNOSIS — D61818 Other pancytopenia: Secondary | ICD-10-CM

## 2018-08-13 DIAGNOSIS — E1165 Type 2 diabetes mellitus with hyperglycemia: Secondary | ICD-10-CM

## 2018-08-13 DIAGNOSIS — C7931 Secondary malignant neoplasm of brain: Secondary | ICD-10-CM

## 2018-08-13 DIAGNOSIS — C787 Secondary malignant neoplasm of liver and intrahepatic bile duct: Secondary | ICD-10-CM

## 2018-08-13 DIAGNOSIS — E440 Moderate protein-calorie malnutrition: Secondary | ICD-10-CM

## 2018-08-13 DIAGNOSIS — C50912 Malignant neoplasm of unspecified site of left female breast: Secondary | ICD-10-CM

## 2018-08-13 DIAGNOSIS — D61810 Antineoplastic chemotherapy induced pancytopenia: Secondary | ICD-10-CM

## 2018-08-13 DIAGNOSIS — Z794 Long term (current) use of insulin: Secondary | ICD-10-CM

## 2018-08-13 DIAGNOSIS — C50919 Malignant neoplasm of unspecified site of unspecified female breast: Secondary | ICD-10-CM

## 2018-08-13 DIAGNOSIS — Z171 Estrogen receptor negative status [ER-]: Secondary | ICD-10-CM

## 2018-08-13 DIAGNOSIS — Z7951 Long term (current) use of inhaled steroids: Secondary | ICD-10-CM

## 2018-08-13 DIAGNOSIS — C7800 Secondary malignant neoplasm of unspecified lung: Secondary | ICD-10-CM

## 2018-08-13 DIAGNOSIS — R509 Fever, unspecified: Secondary | ICD-10-CM

## 2018-08-13 DIAGNOSIS — E43 Unspecified severe protein-calorie malnutrition: Secondary | ICD-10-CM

## 2018-08-13 DIAGNOSIS — E860 Dehydration: Secondary | ICD-10-CM

## 2018-08-13 LAB — CMP (CANCER CENTER ONLY)
ALT: 23 U/L (ref 0–44)
AST: 24 U/L (ref 15–41)
Albumin: 1.9 g/dL — ABNORMAL LOW (ref 3.5–5.0)
Alkaline Phosphatase: 264 U/L — ABNORMAL HIGH (ref 38–126)
Anion gap: 14 (ref 5–15)
BUN: 36 mg/dL — ABNORMAL HIGH (ref 6–20)
CO2: 23 mmol/L (ref 22–32)
Calcium: 9.1 mg/dL (ref 8.9–10.3)
Chloride: 101 mmol/L (ref 98–111)
Creatinine: 1.1 mg/dL — ABNORMAL HIGH (ref 0.44–1.00)
GFR, Est AFR Am: >60 mL/min (ref >60)
GFR, Estimated: 55 mL/min — ABNORMAL LOW (ref >60)
Glucose, Bld: 208 mg/dL — ABNORMAL HIGH (ref 70–99)
Potassium: 4.2 mmol/L (ref 3.5–5.1)
Sodium: 138 mmol/L (ref 135–145)
Total Bilirubin: 1 mg/dL (ref 0.3–1.2)
Total Protein: 7.2 g/dL (ref 6.5–8.1)

## 2018-08-13 LAB — CBC WITH DIFFERENTIAL (CANCER CENTER ONLY)
Abs Immature Granulocytes: 1.89 10*3/uL — ABNORMAL HIGH (ref 0.00–0.07)
Basophils Absolute: 0.3 10*3/uL — ABNORMAL HIGH (ref 0.0–0.1)
Basophils Relative: 2 %
Eosinophils Absolute: 0 10*3/uL (ref 0.0–0.5)
Eosinophils Relative: 0 %
HCT: 19.5 % — ABNORMAL LOW (ref 36.0–46.0)
Hemoglobin: 6.1 g/dL — CL (ref 12.0–15.0)
Immature Granulocytes: 14 %
Lymphocytes Relative: 10 %
Lymphs Abs: 1.3 10*3/uL (ref 0.7–4.0)
MCH: 26.3 pg (ref 26.0–34.0)
MCHC: 31.3 g/dL (ref 30.0–36.0)
MCV: 84.1 fL (ref 80.0–100.0)
Monocytes Absolute: 0.7 10*3/uL (ref 0.1–1.0)
Monocytes Relative: 5 %
Neutro Abs: 9 10*3/uL — ABNORMAL HIGH (ref 1.7–7.7)
Neutrophils Relative %: 69 %
Platelet Count: 42 10*3/uL — ABNORMAL LOW (ref 150–400)
RBC: 2.32 MIL/uL — ABNORMAL LOW (ref 3.87–5.11)
RDW: 18.6 % — ABNORMAL HIGH (ref 11.5–15.5)
WBC Count: 13.2 10*3/uL — ABNORMAL HIGH (ref 4.0–10.5)
nRBC: 0.2 % (ref 0.0–0.2)

## 2018-08-13 LAB — PREPARE RBC (CROSSMATCH)

## 2018-08-13 LAB — SAMPLE TO BLOOD BANK

## 2018-08-13 MED ORDER — OXYCODONE-ACETAMINOPHEN 5-325 MG PO TABS
2.0000 | ORAL_TABLET | Freq: Once | ORAL | Status: AC
  Administered 2018-08-13: 2 via ORAL

## 2018-08-13 MED ORDER — DIPHENHYDRAMINE HCL 25 MG PO CAPS
25.0000 mg | ORAL_CAPSULE | Freq: Once | ORAL | Status: AC
  Administered 2018-08-13: 25 mg via ORAL

## 2018-08-13 MED ORDER — ACETAMINOPHEN 325 MG PO TABS
650.0000 mg | ORAL_TABLET | Freq: Once | ORAL | Status: AC
  Administered 2018-08-13: 650 mg via ORAL

## 2018-08-13 MED ORDER — HEPARIN SOD (PORK) LOCK FLUSH 100 UNIT/ML IV SOLN
250.0000 [IU] | INTRAVENOUS | Status: DC | PRN
  Filled 2018-08-13: qty 5

## 2018-08-13 MED ORDER — OXYCODONE-ACETAMINOPHEN 5-325 MG PO TABS
ORAL_TABLET | ORAL | Status: AC
  Filled 2018-08-13: qty 2

## 2018-08-13 MED ORDER — HEPARIN SOD (PORK) LOCK FLUSH 100 UNIT/ML IV SOLN
500.0000 [IU] | Freq: Every day | INTRAVENOUS | Status: AC | PRN
  Administered 2018-08-13: 500 [IU]
  Filled 2018-08-13: qty 5

## 2018-08-13 MED ORDER — SODIUM CHLORIDE 0.9% FLUSH
10.0000 mL | INTRAVENOUS | Status: DC | PRN
  Filled 2018-08-13: qty 10

## 2018-08-13 MED ORDER — ONDANSETRON HCL 4 MG/2ML IJ SOLN
8.0000 mg | Freq: Once | INTRAMUSCULAR | Status: AC
  Administered 2018-08-13: 11:00:00 8 mg via INTRAVENOUS

## 2018-08-13 MED ORDER — SODIUM CHLORIDE 0.9 % IV SOLN: Freq: Once | INTRAVENOUS | Status: DC

## 2018-08-13 MED ORDER — ACETAMINOPHEN 325 MG PO TABS
ORAL_TABLET | ORAL | Status: AC
  Filled 2018-08-13: qty 2

## 2018-08-13 MED ORDER — DIPHENHYDRAMINE HCL 25 MG PO CAPS
ORAL_CAPSULE | ORAL | Status: AC
  Filled 2018-08-13: qty 1

## 2018-08-13 MED ORDER — SODIUM CHLORIDE 0.9 % IV SOLN
Freq: Once | INTRAVENOUS | Status: AC
  Administered 2018-08-13: 11:00:00 via INTRAVENOUS
  Filled 2018-08-13: qty 250

## 2018-08-13 MED ORDER — SODIUM CHLORIDE 0.9% FLUSH
10.0000 mL | Freq: Once | INTRAVENOUS | Status: AC
  Administered 2018-08-13: 10 mL
  Filled 2018-08-13: qty 10

## 2018-08-13 MED ORDER — ONDANSETRON HCL 4 MG/2ML IJ SOLN
INTRAMUSCULAR | Status: AC
  Filled 2018-08-13: qty 4

## 2018-08-13 MED ORDER — SODIUM CHLORIDE 0.9% IV SOLUTION
250.0000 mL | Freq: Once | INTRAVENOUS | Status: AC
  Administered 2018-08-13: 250 mL via INTRAVENOUS
  Filled 2018-08-13: qty 250

## 2018-08-13 NOTE — Assessment & Plan Note (Signed)
Her blood sugar is elevated She will continue insulin therapy

## 2018-08-13 NOTE — Telephone Encounter (Signed)
Received call report from [facility].  "Today's Hgb = 6.0.  Do have hold for blood bank I am sending now."  Writer to [facility] to reach provider with results.

## 2018-08-13 NOTE — Progress Notes (Signed)
Received a phone call from the registration desk. Patient arriving for appointment felt dizzy and feeling like she was going to pass out. RN went to registration desk patient responsive but weak. Vitals obtained see flowsheet. Patient brought to exam room and assisted by RN and NT to table to lye down. Port accessed and labs drawn. Patient complained of pain in her lower back, sacrum area. Per Dr. Alvy Bimler 2 percocet's given. Patient evaluated by Dr. Alvy Bimler and assisted by RN and NT down from table. RN brought patient to infusion room, handoff report given to [place], [place]

## 2018-08-13 NOTE — Assessment & Plan Note (Signed)
She has severe pancytopenia due to bone marrow involvement and recent treatment We will proceed with 2 units of blood today She does not need platelet transfusion

## 2018-08-13 NOTE — Patient Instructions (Signed)
Blood Transfusion, Adult, Care After This sheet gives you information about how to care for yourself after your procedure. Your doctor may also give you more specific instructions. If you have problems or questions, contact your doctor. Follow these instructions at home:   Take over-the-counter and prescription medicines only as told by your doctor.  Go back to your normal activities as told by your doctor.  Follow instructions from your doctor about how to take care of the area where an IV tube was put into your vein (insertion site). Make sure you: ? Wash your hands with soap and water before you change your bandage (dressing). If there is no soap and water, use hand sanitizer. ? Change your bandage as told by your doctor.  Check your IV insertion site every day for signs of infection. Check for: ? More redness, swelling, or pain. ? More fluid or blood. ? Warmth. ? Pus or a bad smell. Contact a doctor if:  You have more redness, swelling, or pain around the IV insertion site.  You have more fluid or blood coming from the IV insertion site.  Your IV insertion site feels warm to the touch.  You have pus or a bad smell coming from the IV insertion site.  Your pee (urine) turns pink, red, or brown.  You feel weak after doing your normal activities. Get help right away if:  You have signs of a serious allergic or body defense (immune) system reaction, including: ? Itchiness. ? Hives. ? Trouble breathing. ? Anxiety. ? Pain in your chest or lower back. ? Fever, flushing, and chills. ? Fast pulse. ? Rash. ? Watery poop (diarrhea). ? Throwing up (vomiting). ? Dark pee. ? Serious headache. ? Dizziness. ? Stiff neck. ? Yellow color in your face or the white parts of your eyes (jaundice). Summary  After a blood transfusion, return to your normal activities as told by your doctor.  Every day, check for signs of infection where the IV tube was put into your vein.  Some  signs of infection are warm skin, more redness and pain, more fluid or blood, and pus or a bad smell where the needle went in.  Contact your doctor if you feel weak or have any unusual symptoms. This information is not intended to replace advice given to you by your health care provider. Make sure you discuss any questions you have with your health care provider. Document Released: 02/05/2014 Document Revised: 05/22/2017 Document Reviewed: 09/09/2015 Elsevier Patient Education  [employer].  Dehydration, Adult  Dehydration is when there is not enough fluid or water in your body. This happens when you lose more fluids than you take in. Dehydration can range from mild to very bad. It should be treated right away to keep it from getting very bad. Symptoms of mild dehydration may include:  Thirst.  Dry lips.  Slightly dry mouth.  Dry, warm skin.  Dizziness. Symptoms of moderate dehydration may include:  Very dry mouth.  Muscle cramps.  Dark pee (urine). Pee may be the color of tea.  Your body making less pee.  Your eyes making fewer tears.  Heartbeat that is uneven or faster than normal (palpitations).  Headache.  Light-headedness, especially when you stand up from sitting.  Fainting (syncope). Symptoms of very bad dehydration may include:  Changes in skin, such as: ? Cold and clammy skin. ? Blotchy (mottled) or pale skin. ? Skin that does not quickly return to normal after being lightly pinched and  let go (poor skin turgor).  Changes in body fluids, such as: ? Feeling very thirsty. ? Your eyes making fewer tears. ? Not sweating when body temperature is high, such as in hot weather. ? Your body making very little pee.  Changes in vital signs, such as: ? Weak pulse. ? Pulse that is more than 100 beats a minute when you are sitting still. ? Fast breathing. ? Low blood pressure.  Other changes, such as: ? Sunken eyes. ? Cold hands and feet. ? Confusion.  ? Lack of energy (lethargy). ? Trouble waking up from sleep. ? Short-term weight loss. ? Unconsciousness. Follow these instructions at home:   If told by your doctor, drink an ORS: ? Make an ORS by using instructions on the package. ? Start by drinking small amounts, about  cup (120 mL) every 5-10 minutes. ? Slowly drink more until you have had the amount that your doctor said to have.  Drink enough clear fluid to keep your pee clear or pale yellow. If you were told to drink an ORS, finish the ORS first, then start slowly drinking clear fluids. Drink fluids such as: ? Water. Do not drink only water by itself. Doing that can make the salt (sodium) level in your body get too low (hyponatremia). ? Ice chips. ? Fruit juice that you have added water to (diluted). ? Low-calorie sports drinks.  Avoid: ? Alcohol. ? Drinks that have a lot of sugar. These include high-calorie sports drinks, fruit juice that does not have water added, and soda. ? Caffeine. ? Foods that are greasy or have a lot of fat or sugar.  Take over-the-counter and prescription medicines only as told by your doctor.  Do not take salt tablets. Doing that can make the salt level in your body get too high (hypernatremia).  Eat foods that have minerals (electrolytes). Examples include bananas, oranges, potatoes, tomatoes, and spinach.  Keep all follow-up visits as told by your doctor. This is important. Contact a doctor if:  You have belly (abdominal) pain that: ? Gets worse. ? Stays in one area (localizes).  You have a rash.  You have a stiff neck.  You get angry or annoyed more easily than normal (irritability).  You are more sleepy than normal.  You have a harder time waking up than normal.  You feel: ? Weak. ? Dizzy. ? Very thirsty.  You have peed (urinated) only a small amount of very dark pee during 6-8 hours. Get help right away if:  You have symptoms of very bad dehydration.  You cannot drink  fluids without throwing up (vomiting).  Your symptoms get worse with treatment.  You have a fever.  You have a very bad headache.  You are throwing up or having watery poop (diarrhea) and it: ? Gets worse. ? Does not go away.  You have blood or something green (bile) in your throw-up.  You have blood in your poop (stool). This may cause poop to look black and tarry.  You have not peed in 6-8 hours.  You pass out (faint).  Your heart rate when you are sitting still is more than 100 beats a minute.  You have trouble breathing. This information is not intended to replace advice given to you by your health care provider. Make sure you discuss any questions you have with your health care provider. Document Released: 11/11/2008 Document Revised: 12/28/2016 Document Reviewed: 03/11/2015 Elsevier Patient Education  2020 [employer].

## 2018-08-13 NOTE — Assessment & Plan Note (Signed)
She had recent nausea & vomiting I recommend round-the-clock antiemetics

## 2018-08-13 NOTE — Telephone Encounter (Signed)
Result note on provider desktop.  Stacy Shelton scheduled provider F/U today at 10:00 am, transfusion at 11:00 am.

## 2018-08-13 NOTE — Assessment & Plan Note (Signed)
She has severe malnutrition due to poor oral intake We will continue antiemetics to control her nausea and encouraged the patient to increase oral intake as tolerated

## 2018-08-13 NOTE — Progress Notes (Signed)
Pt's O2 dropped to 82% RA. Pt states she has been using supplemental O2 at home, 3L [place]. O2 applied and pt's saturations normalized (see flowsheet). Pt denies feeling SOB, but does have a nonproductive cough. Dr. Alvy Bimler notified about Pt's O2 saturation and BP. Per Dr. Alvy Bimler, ok to proceed with fluids and blood transfusion.  Will continue to monitor.

## 2018-08-13 NOTE — Assessment & Plan Note (Signed)
She is frail and has lost some weight due to poor oral intake We will continue aggressive supportive care I will see her in a few days for further evaluation and supportive care

## 2018-08-13 NOTE — Assessment & Plan Note (Signed)
She has mild intermittent fever She has no localizing signs to suggest active infection She has completed a course of antibiotic therapy I recommend observation only

## 2018-08-13 NOTE — Progress Notes (Signed)
[place] OFFICE PROGRESS NOTE  Patient Care Team: Robyne Peers, MD as PCP - General (Family Medicine) Elouise Munroe, MD as PCP - Cardiology (Cardiology)  ASSESSMENT & PLAN:  Metastatic breast cancer [facility]) She is frail and has lost some weight due to poor oral intake We will continue aggressive supportive care I will see her in a few days for further evaluation and supportive care  Pancytopenia, acquired [facility]) She has severe pancytopenia due to bone marrow involvement and recent treatment We will proceed with 2 units of blood today She does not need platelet transfusion  Malnutrition of moderate degree She has severe malnutrition due to poor oral intake We will continue antiemetics to control her nausea and encouraged the patient to increase oral intake as tolerated  Uncontrolled diabetes mellitus with hyperglycemia ([place]) Her blood sugar is elevated She will continue insulin therapy  Fever She has mild intermittent fever She has no localizing signs to suggest active infection She has completed a course of antibiotic therapy I recommend observation only  Nausea and vomiting She had recent nausea & vomiting I recommend round-the-clock antiemetics   Orders Placed This Encounter  Procedures  . Prepare RBC    Standing Status:   Standing    Number of Occurrences:   1    Order Specific Question:   # of Units    Answer:   2 units    Order Specific Question:   Transfusion Indications    Answer:   Symptomatic Anemia    Order Specific Question:   Special Requirements    Answer:   Irradiated    Order Specific Question:   If emergent release call blood bank    Answer:   Not emergent release    INTERVAL HISTORY: Please see below for problem oriented charting. She returns for further follow-up She has some low-grade fever at home She has completed a course of antibiotics with ciprofloxacin Her chronic cough/shortness of breath is stable She is  using oxygen therapy and inhalers Her blood sugar at home is around 150 Her intake is poor due to recent nausea and vomiting She denies constipation She has persistent low back pain, resolved with morphine   SUMMARY OF ONCOLOGIC HISTORY: Oncology History Overview Note  Biopsy is ER negative Her2/neu positive   Metastatic breast cancer ([place])  03/11/2018 Imaging   Ct scan of chest, abdomen and pelvis 1. 2.6 x 2.3 cm spiculated mass identified inferomedial quadrant of the left breast. Lesion appears to retract the overlying skin. Mammographic correlation recommended. 2. Small lymph nodes in the left axillar ill-defined and suspicious for metastatic disease. 3. Multiple bilateral pulmonary nodules measuring up to 2.7 cm diameter. These probably represent metastatic disease. Given the dominant size of the central right upper lobe lesion, synchronous lung primary can not be completely excluded. 4. Multiple ill-defined liver lesions compatible with metastatic disease. Dominant liver metastases measure up to almost 4 cm. 5.  Aortic Atherosclerois (ICD10-170.0)    03/12/2018 Pathology Results   Liver, needle/core biopsy, left lobe - METASTATIC CARCINOMA. - LYMPHOVASCULAR INVASION IS IDENTIFIED. - SEE COMMENT. Microscopic Comment The tumor cells are positive for cytokeratin 7. There is faint staining for GATA-3. There is non-specific staining for TTF-1. Cytokeratin 20, CDX-2, estrogen receptor, and GCDFP stains are negative. Given the clinical suspicion, the profile supports a primary breast carcinoma. Her2 will be performed and the results reported separately.  By immunohistochemistry, the tumor cells are POSITIVE for Her2 (3+).   03/12/2018 Imaging  Single 1.3 x 1.4 cm RIGHT occipital metastasis.  Borderline pachymeningeal enhancement of symmetric nature could be related to the superficial metastasis or osseous disease.   03/12/2018 Procedure   Ultrasound-guided core biopsy performed of  a mass within the lateral segment of the left lobe of the liver.   03/16/2018 Imaging   Right occipital lobe 13 x 16 mm. Small satellite enhancing nodule deep to the larger lesion. The larger lesion shows central necrosis and probable mild hemorrhage or calcification.  Mild dural enhancement is less impressive and could be within normal limits.  Bone marrow in the clivus and cervical spine is diffusely low signal. No focal lesion. This may be related to the patient's anemia and abnormal blood count.   03/17/2018 Cancer Staging   Staging form: Breast, AJCC 8th Edition - Clinical: Stage IV (cT2, cN0, pM1, GX, ER-, PR: Not Assessed, HER2+) - Signed by [name], MD on 03/17/2018   03/19/2018 -  Chemotherapy   She received chemo with Taxotere, Herceptin and Perjeta   03/21/2018 Echocardiogram   IMPRESSIONS 1. The left ventricle has normal systolic function with an ejection fraction of 60-65%. The cavity size was normal. Left ventricular diastolic Doppler parameters are consistent with impaired relaxation.  2. The right ventricle has normal systolic function. The cavity was normal. There is no increase in right ventricular wall thickness.  3. The mitral valve is normal in structure.  4. The tricuspid valve is normal in structure.  5. Strain imaging performed but not reported due to interpreter judgement, secondary to suboptimal image quality.   03/24/2018 Procedure   Successful placement of a right IJ approach Power Port with ultrasound and fluoroscopic guidance. The catheter is ready for use.   04/07/2018 - 04/16/2018 Hospital Admission   She was admitted for pneumonia   04/07/2018 - 04/16/2018 Hospital Admission   She was admitted to the hospital for pneumonia and respiratory failure   04/25/2018 Imaging   Retropharyngeal fluid collection compatible with effusion or possibly abscess. Soft tissue thickening extends into the right neck and surrounds the right carotid bifurcation and right  internal carotid artery. There is also streaky density in the right lateral neck soft tissues with a focal 7.6 Mm lymph node in the right lateral neck. Stranding surrounds the right submandibular gland.   Findings are most compatible with infection. Favor pharyngitis with retropharyngeal effusion/abscess. Soft tissue thickening around the right carotid most compatible with infection rather than tumor. Right jugular vein is patent.  Multiple nodular densities in the right upper lobe may represent residual pneumonia or metastatic disease. Interval improvement in right upper lobe infiltrate compared with CT of 04/08/2018   04/25/2018 - 05/06/2018 Hospital Admission   She was admitted for retropharyngeal abscess   04/27/2018 Imaging   No evidence of large central pulmonary embolus is noted in the main pulmonary artery or the main portions of the right and left pulmonary arteries. However, evaluation of the lower lobe branches, particularly on the right, is limited due to respiratory motion artifact and other limiting issues. Pulmonary emboli and smaller peripheral branches can not be excluded on the basis of this exam.  Large left pleural effusion is noted with complete atelectasis of the left lower lobe. Increased left upper lobe opacity is noted posteriorly concerning for atelectasis or pneumonia.  Improved right upper lobe and lower lobe opacities are noted suggesting improving pneumonia or atelectasis.  15 mm right paratracheal lymph node is noted which is enlarged compared to prior exam; it is uncertain  if this is metastatic or inflammatory in etiology.  Hepatic metastatic lesions are again noted.  Aortic Atherosclerosis (ICD10-I70.0).    04/27/2018 Procedure   Successful ultrasound guided left thoracentesis yielding 120 mL of blood-tinged of pleural fluid.   06/11/2018 Imaging   1. No evidence of pulmonary emboli. 2. Extensive bilateral lower lobe consolidation. 3. Small pleural  effusions. 4. 2.5 cm right upper lobe nodule, enlarged from 04/27/2018 and which may reflect a metastasis or primary lung cancer. Stable to mild enlargement of subcentimeter nodules in both upper lobes consistent with known metastases. 5. Decreased size of liver metastases. 6. Unchanged mild mediastinal and right hilar lymphadenopathy. 7. Aortic Atherosclerosis (ICD10-I70.0).   06/11/2018 - 06/22/2018 Hospital Admission   She was hospitalized for infection and SVT   07/18/2018 Imaging   MRI brain 1. Regression of solitary right occipital lobe metastasis and edema. No new brain metastasis. 2. New ill-defined enhancement at the atlantooccipital membrane, presumably a metastatic focus. The adjacent bone marrow is abnormally low signal but not enhancing to the degree of the extraosseous lesion. Marrow signal is likely from anemia.     Metastasis to brain ([place])  03/17/2018 Initial Diagnosis   Metastasis to brain [facility])   Metastasis to liver ([place])  03/17/2018 Initial Diagnosis   Metastasis to liver [facility])   Metastasis to bone ([place])  03/17/2018 Initial Diagnosis   Metastasis to bone ([facility])     REVIEW OF SYSTEMS:   Eyes: Denies blurriness of vision Ears, nose, mouth, throat, and face: Denies mucositis or sore throat Cardiovascular: Denies palpitation, chest discomfort or lower extremity swelling Skin: Denies abnormal skin rashes Lymphatics: Denies new lymphadenopathy or easy bruising Behavioral/Psych: Mood is stable, no new changes  All other systems were reviewed with the patient and are negative.  I have reviewed the past medical history, past surgical history, social history and family history with the patient and they are unchanged from previous note.  ALLERGIES:  is allergic to nsaids.  MEDICATIONS:  Current Outpatient Medications  Medication Sig Dispense Refill  . ACCU-CHEK AVIVA PLUS test strip     . ACCU-CHEK SOFTCLIX LANCETS lancets     . acetaminophen (TYLENOL) 325 MG tablet  Take 2 tablets (650 mg total) by mouth every 6 (six) hours as needed for mild pain (or Fever >/= 101). 30 tablet 0  . albuterol (PROAIR HFA) 108 (90 Base) MCG/ACT inhaler Inhale 2 puffs into the lungs every 6 (six) hours as needed for wheezing or shortness of breath. 1 Inhaler 1  . amiodarone (PACERONE) 200 MG tablet TAKE 1 TABLET BY MOUTH EVERY DAY 30 tablet 0  . benzonatate (TESSALON) 200 MG capsule Take 1 capsule (200 mg total) by mouth 3 (three) times daily as needed for cough (use second). 20 capsule 0  . blood glucose meter kit and supplies KIT Dispense based on patient and insurance preference. Use up to four times daily as directed. (FOR ICD-9 250.00, 250.01). 1 each 0  . ciprofloxacin (CIPRO) 250 MG tablet Take 1 tablet (250 mg total) by mouth 2 (two) times daily. 10 tablet 0  . esomeprazole (NEXIUM) 40 MG capsule Take 1 capsule (40 mg total) by mouth daily at 12 noon. 90 capsule 3  . feeding supplement, ENSURE ENLIVE, (ENSURE ENLIVE) LIQD Take 237 mLs by mouth 3 (three) times daily between meals. 237 mL 12  . fluticasone (FLOVENT HFA) 44 MCG/ACT inhaler Inhale 2 puffs into the lungs 2 (two) times daily. 3 Inhaler 1  . insulin aspart (NOVOLOG) 100  UNIT/ML FlexPen Inject 8 Units into the skin 3 (three) times daily with meals. (Patient taking differently: Inject 8 Units into the skin 3 (three) times daily with meals. Holds dose if not going to eat.) 15 mL 11  . Insulin Detemir (LEVEMIR) 100 UNIT/ML Pen Inject 10 Units into the skin daily. (Patient taking differently: Inject 10 Units into the skin at bedtime. ) 15 mL 0  . Insulin Pen Needle 31G X 5 MM MISC 1 Device by Does not apply route QID. For use with insulin pens 100 each 0  . lidocaine-prilocaine (EMLA) cream Apply to affected area once (Patient taking differently: Apply 1 application topically as needed (port access). Apply to affected area once) 30 g 3  . LORazepam (ATIVAN) 0.5 MG tablet Take 1 tab po 30 minutes prior to radiation or MRI  (Patient taking differently: Take 0.5 mg by mouth See admin instructions. Take 30 minutes prior to radiation or MRI) 30 tablet 0  . metFORMIN (GLUCOPHAGE) 500 MG tablet Take 500 mg by mouth daily with breakfast.     . metoprolol tartrate (LOPRESSOR) 25 MG tablet Take 0.5 tablets (12.5 mg total) by mouth 2 (two) times daily. 60 tablet 0  . mirtazapine (REMERON) 30 MG tablet Take 1 tablet (30 mg total) by mouth at bedtime. 60 tablet 9  . montelukast (SINGULAIR) 10 MG tablet Take 1 tablet (10 mg total) by mouth daily. 90 tablet 1  . morphine (MSIR) 15 MG tablet Take 1 tablet (15 mg total) by mouth every 4 (four) hours as needed for severe pain. 60 tablet 0  . Multiple Vitamin (MULTIVITAMIN WITH MINERALS) TABS tablet Take 1 tablet by mouth daily.    . ondansetron (ZOFRAN) 8 MG tablet Take 8 mg by mouth every 8 (eight) hours as needed for nausea or vomiting.     . prochlorperazine (COMPAZINE) 10 MG tablet Take 10 mg by mouth every 6 (six) hours as needed for nausea or vomiting.      No current facility-administered medications for this visit.    Facility-Administered Medications Ordered in Other Visits  Medication Dose Route Frequency Provider Last Rate Last Dose  . heparin lock flush 100 unit/mL  250 Units Intracatheter PRN Alvy Bimler, Cleona Doubleday, MD      . heparin lock flush 100 unit/mL  250 Units Intracatheter PRN Alvy Bimler, Antionetta Ator, MD      . heparin lock flush 100 unit/mL  500 Units Intracatheter Daily PRN Alvy Bimler, Wilho Sharpley, MD      . sodium chloride flush (NS) 0.9 % injection 10 mL  10 mL Intracatheter Once Alvy Bimler, Uilani Sanville, MD      . sodium chloride flush (NS) 0.9 % injection 10 mL  10 mL Intracatheter PRN Dariel Pellecchia, MD      . sodium chloride flush (NS) 0.9 % injection 3 mL  3 mL Intracatheter PRN Liara Holm, MD        PHYSICAL EXAMINATION: ECOG PERFORMANCE STATUS: 2 - Symptomatic, <50% confined to bed  Vitals:   08/13/18 0950  BP: (!) 96/41  Pulse: (!) 117  Resp: (!) 21  Temp: 98.4 F (36.9 C)  SpO2: 92%    There were no vitals filed for this visit.  GENERAL:alert, no distress and comfortable SKIN: skin color, texture, turgor are normal, no rashes or significant lesions EYES: normal, Conjunctiva are pink and non-injected, sclera clear OROPHARYNX:no exudate, no erythema and lips, buccal mucosa, and tongue normal  NECK: supple, thyroid normal size, non-tender, without nodularity LYMPH:  no palpable lymphadenopathy in  the cervical, axillary or inguinal LUNGS: clear to auscultation and percussion with normal breathing effort HEART: regular rate & rhythm and no murmurs and no lower extremity edema ABDOMEN:abdomen soft, non-tender and normal bowel sounds Musculoskeletal:no cyanosis of digits and no clubbing  NEURO: alert & oriented x 3 with fluent speech, no focal motor/sensory deficits  LABORATORY DATA:  I have reviewed the data as listed    Component Value Date/Time   NA 138 08/13/2018 0920   K 4.2 08/13/2018 0920   CL 101 08/13/2018 0920   CO2 23 08/13/2018 0920   GLUCOSE 208 (H) 08/13/2018 0920   BUN 36 (H) 08/13/2018 0920   CREATININE 1.10 (H) 08/13/2018 0920   CALCIUM 9.1 08/13/2018 0920   PROT 7.2 08/13/2018 0920   ALBUMIN 1.9 (L) 08/13/2018 0920   AST 24 08/13/2018 0920   ALT 23 08/13/2018 0920   ALKPHOS 264 (H) 08/13/2018 0920   BILITOT 1.0 08/13/2018 0920   GFRNONAA 55 (L) 08/13/2018 0920   GFRAA >60 08/13/2018 0920    No results found for: SPEP, UPEP  Lab Results  Component Value Date   WBC 13.2 (H) 08/13/2018   NEUTROABS 9.0 (H) 08/13/2018   HGB 6.1 (LL) 08/13/2018   HCT 19.5 (L) 08/13/2018   MCV 84.1 08/13/2018   PLT 42 (L) 08/13/2018      Chemistry      Component Value Date/Time   NA 138 08/13/2018 0920   K 4.2 08/13/2018 0920   CL 101 08/13/2018 0920   CO2 23 08/13/2018 0920   BUN 36 (H) 08/13/2018 0920   CREATININE 1.10 (H) 08/13/2018 0920      Component Value Date/Time   CALCIUM 9.1 08/13/2018 0920   ALKPHOS 264 (H) 08/13/2018 0920   AST 24  08/13/2018 0920   ALT 23 08/13/2018 0920   BILITOT 1.0 08/13/2018 0920       RADIOGRAPHIC STUDIES: I have personally reviewed the radiological images as listed and agreed with the findings in the report. Mr Jeri Cos Wo Contrast  Result Date: 07/18/2018 CLINICAL DATA:  Breast cancer. SRS restaging of solitary right occipital metastasis. EXAM: MRI HEAD WITHOUT AND WITH CONTRAST TECHNIQUE: Multiplanar, multiecho pulse sequences of the brain and surrounding structures were obtained without and with intravenous contrast. CONTRAST:  25m MULTIHANCE GADOBENATE DIMEGLUMINE 529 MG/ML IV SOLN COMPARISON:  03/16/2018 FINDINGS: BRAIN New Lesions: None. Larger lesions: None. Stable or Smaller lesions: 8 mm mm enhancing lesion located right occipital lobe and seen on 11:75. Associated vasogenic edema is also significantly diminished. Other Brain findings: No acute infarct, hemorrhage, hydrocephalus, or collection. Chronic blood products associated with the treated metastasis. Vascular: Major flow voids are preserved Skull and upper cervical spine: New ill-defined enhancement at the level of the atlanto occipital membrane, where there was previously thin ligament and normal fat. Thickening measures up to 12 mm on sagittal postcontrast imaging. The adjacent posterior ring of C1 marrow is abnormally hypointense, but not enhancing in similar fashion. Marrow is diffusely low signal. Sinuses/Orbits: Paranasal sinus surgery.  No acute sinusitis. IMPRESSION: 1. Regression of solitary right occipital lobe metastasis and edema. No new brain metastasis. 2. New ill-defined enhancement at the atlantooccipital membrane, presumably a metastatic focus. The adjacent bone marrow is abnormally low signal but not enhancing to the degree of the extraosseous lesion. Marrow signal is likely from anemia. Electronically Signed   By: JMonte FantasiaM.D.   On: 07/18/2018 07:10    All questions were answered. The patient knows to call the  clinic with any problems, questions or concerns. No barriers to learning was detected.  I spent 30 minutes counseling the patient face to face. The total time spent in the appointment was 40 minutes and more than 50% was on counseling and review of test results  [name], MD 08/13/2018 1:51 PM

## 2018-08-14 ENCOUNTER — Telehealth: Payer: Self-pay | Admitting: Hematology and Oncology

## 2018-08-14 LAB — BPAM RBC
Blood Product Expiration Date: 202008092359
Blood Product Expiration Date: 202008102359
ISSUE DATE / TIME: 202007151123
ISSUE DATE / TIME: 202007151123
Unit Type and Rh: 6200
Unit Type and Rh: 6200

## 2018-08-14 LAB — TYPE AND SCREEN
ABO/RH(D): A POS
Antibody Screen: NEGATIVE
Unit division: 0
Unit division: 0

## 2018-08-14 NOTE — Telephone Encounter (Signed)
Scheduled appt per 7/16 sch message - pt aware pf appt date and time

## 2018-08-18 ENCOUNTER — Inpatient Hospital Stay: Payer: BC Managed Care – PPO

## 2018-08-18 ENCOUNTER — Telehealth: Payer: Self-pay | Admitting: *Deleted

## 2018-08-18 ENCOUNTER — Other Ambulatory Visit: Payer: Self-pay

## 2018-08-18 ENCOUNTER — Other Ambulatory Visit: Payer: Self-pay | Admitting: Hematology and Oncology

## 2018-08-18 ENCOUNTER — Inpatient Hospital Stay (HOSPITAL_BASED_OUTPATIENT_CLINIC_OR_DEPARTMENT_OTHER): Payer: BC Managed Care – PPO | Admitting: Hematology and Oncology

## 2018-08-18 VITALS — BP 79/53 | HR 141 | Temp 97.2°F | Resp 24

## 2018-08-18 DIAGNOSIS — C50919 Malignant neoplasm of unspecified site of unspecified female breast: Secondary | ICD-10-CM

## 2018-08-18 DIAGNOSIS — C7951 Secondary malignant neoplasm of bone: Secondary | ICD-10-CM

## 2018-08-18 DIAGNOSIS — Z7189 Other specified counseling: Secondary | ICD-10-CM

## 2018-08-18 DIAGNOSIS — Z79899 Other long term (current) drug therapy: Secondary | ICD-10-CM

## 2018-08-18 DIAGNOSIS — C787 Secondary malignant neoplasm of liver and intrahepatic bile duct: Secondary | ICD-10-CM | POA: Diagnosis not present

## 2018-08-18 DIAGNOSIS — C7801 Secondary malignant neoplasm of right lung: Secondary | ICD-10-CM

## 2018-08-18 DIAGNOSIS — C7931 Secondary malignant neoplasm of brain: Secondary | ICD-10-CM

## 2018-08-18 DIAGNOSIS — Z7951 Long term (current) use of inhaled steroids: Secondary | ICD-10-CM

## 2018-08-18 DIAGNOSIS — E440 Moderate protein-calorie malnutrition: Secondary | ICD-10-CM

## 2018-08-18 DIAGNOSIS — Z171 Estrogen receptor negative status [ER-]: Secondary | ICD-10-CM

## 2018-08-18 DIAGNOSIS — E1165 Type 2 diabetes mellitus with hyperglycemia: Secondary | ICD-10-CM

## 2018-08-18 DIAGNOSIS — E43 Unspecified severe protein-calorie malnutrition: Secondary | ICD-10-CM

## 2018-08-18 DIAGNOSIS — E860 Dehydration: Secondary | ICD-10-CM

## 2018-08-18 DIAGNOSIS — R112 Nausea with vomiting, unspecified: Secondary | ICD-10-CM

## 2018-08-18 DIAGNOSIS — C50912 Malignant neoplasm of unspecified site of left female breast: Secondary | ICD-10-CM

## 2018-08-18 DIAGNOSIS — C7800 Secondary malignant neoplasm of unspecified lung: Secondary | ICD-10-CM

## 2018-08-18 DIAGNOSIS — D61818 Other pancytopenia: Secondary | ICD-10-CM

## 2018-08-18 DIAGNOSIS — Z940 Kidney transplant status: Secondary | ICD-10-CM

## 2018-08-18 DIAGNOSIS — D61810 Antineoplastic chemotherapy induced pancytopenia: Secondary | ICD-10-CM

## 2018-08-18 LAB — CMP (CANCER CENTER ONLY)
ALT: 24 U/L (ref 0–44)
AST: 15 U/L (ref 15–41)
Albumin: 1.8 g/dL — ABNORMAL LOW (ref 3.5–5.0)
Alkaline Phosphatase: 435 U/L — ABNORMAL HIGH (ref 38–126)
Anion gap: 11 (ref 5–15)
BUN: 28 mg/dL — ABNORMAL HIGH (ref 6–20)
CO2: 25 mmol/L (ref 22–32)
Calcium: 8.7 mg/dL — ABNORMAL LOW (ref 8.9–10.3)
Chloride: 99 mmol/L (ref 98–111)
Creatinine: 0.71 mg/dL (ref 0.44–1.00)
GFR, Est AFR Am: >60 mL/min (ref >60)
GFR, Estimated: >60 mL/min (ref >60)
Glucose, Bld: 366 mg/dL — ABNORMAL HIGH (ref 70–99)
Potassium: 5.2 mmol/L — ABNORMAL HIGH (ref 3.5–5.1)
Sodium: 135 mmol/L (ref 135–145)
Total Bilirubin: 2.3 mg/dL — ABNORMAL HIGH (ref 0.3–1.2)
Total Protein: 6.5 g/dL (ref 6.5–8.1)

## 2018-08-18 LAB — CBC WITH DIFFERENTIAL (CANCER CENTER ONLY)
Abs Immature Granulocytes: 2.63 10*3/uL — ABNORMAL HIGH (ref 0.00–0.07)
Basophils Absolute: 0.3 10*3/uL — ABNORMAL HIGH (ref 0.0–0.1)
Basophils Relative: 3 %
Eosinophils Absolute: 0 10*3/uL (ref 0.0–0.5)
Eosinophils Relative: 0 %
HCT: 27.5 % — ABNORMAL LOW (ref 36.0–46.0)
Hemoglobin: 8.6 g/dL — ABNORMAL LOW (ref 12.0–15.0)
Immature Granulocytes: 22 %
Lymphocytes Relative: 11 %
Lymphs Abs: 1.3 10*3/uL (ref 0.7–4.0)
MCH: 27.2 pg (ref 26.0–34.0)
MCHC: 31.3 g/dL (ref 30.0–36.0)
MCV: 87 fL (ref 80.0–100.0)
Monocytes Absolute: 1.2 10*3/uL — ABNORMAL HIGH (ref 0.1–1.0)
Monocytes Relative: 10 %
Neutro Abs: 6.5 10*3/uL (ref 1.7–7.7)
Neutrophils Relative %: 54 %
Platelet Count: 22 10*3/uL — ABNORMAL LOW (ref 150–400)
RBC: 3.16 MIL/uL — ABNORMAL LOW (ref 3.87–5.11)
RDW: 17.3 % — ABNORMAL HIGH (ref 11.5–15.5)
WBC Count: 11.9 10*3/uL — ABNORMAL HIGH (ref 4.0–10.5)
nRBC: 0.3 % — ABNORMAL HIGH (ref 0.0–0.2)

## 2018-08-18 LAB — SAMPLE TO BLOOD BANK

## 2018-08-18 MED ORDER — SODIUM CHLORIDE 0.9 % IV SOLN
INTRAVENOUS | Status: DC
  Administered 2018-08-18: 13:00:00 via INTRAVENOUS
  Filled 2018-08-18 (×2): qty 250

## 2018-08-18 MED ORDER — SODIUM CHLORIDE 0.9% FLUSH
10.0000 mL | Freq: Once | INTRAVENOUS | Status: AC
  Administered 2018-08-18: 10 mL
  Filled 2018-08-18: qty 10

## 2018-08-18 MED ORDER — SODIUM CHLORIDE 0.9% FLUSH
10.0000 mL | Freq: Once | INTRAVENOUS | Status: AC | PRN
  Administered 2018-08-18: 15:00:00 10 mL
  Filled 2018-08-18: qty 10

## 2018-08-18 MED ORDER — HEPARIN SOD (PORK) LOCK FLUSH 100 UNIT/ML IV SOLN
500.0000 [IU] | Freq: Once | INTRAVENOUS | Status: AC | PRN
  Administered 2018-08-18: 500 [IU]
  Filled 2018-08-18: qty 5

## 2018-08-18 MED ORDER — OXYCODONE-ACETAMINOPHEN 5-325 MG PO TABS
2.0000 | ORAL_TABLET | Freq: Once | ORAL | Status: AC
  Administered 2018-08-18: 2 via ORAL

## 2018-08-18 MED ORDER — SODIUM CHLORIDE 0.9 % IV SOLN
Freq: Once | INTRAVENOUS | Status: AC
  Administered 2018-08-18: 11:00:00 via INTRAVENOUS
  Filled 2018-08-18: qty 250

## 2018-08-18 MED ORDER — ONDANSETRON HCL 4 MG/2ML IJ SOLN
8.0000 mg | Freq: Once | INTRAMUSCULAR | Status: AC
  Administered 2018-08-18: 8 mg via INTRAVENOUS

## 2018-08-18 MED ORDER — INSULIN REGULAR HUMAN 100 UNIT/ML IJ SOLN
16.0000 [IU] | Freq: Once | INTRAMUSCULAR | Status: AC
  Administered 2018-08-18: 16 [IU] via SUBCUTANEOUS
  Filled 2018-08-18: qty 10

## 2018-08-18 MED ORDER — OXYCODONE-ACETAMINOPHEN 5-325 MG PO TABS
ORAL_TABLET | ORAL | Status: AC
  Filled 2018-08-18: qty 2

## 2018-08-18 MED ORDER — ONDANSETRON HCL 4 MG/2ML IJ SOLN
INTRAMUSCULAR | Status: AC
  Filled 2018-08-18: qty 4

## 2018-08-18 MED ORDER — SODIUM CHLORIDE 0.9 % IV SOLN: Freq: Once | INTRAVENOUS | Status: DC

## 2018-08-18 NOTE — Telephone Encounter (Signed)
Patient's sister called and left a message she would like to discuss patient's care and hospice options after Priscille's visit today.

## 2018-08-18 NOTE — Patient Instructions (Addendum)
Dehydration, Adult  Dehydration is when there is not enough fluid or water in your body. This happens when you lose more fluids than you take in. Dehydration can range from mild to very bad. It should be treated right away to keep it from getting very bad. Symptoms of mild dehydration may include:  Thirst.  Dry lips.  Slightly dry mouth.  Dry, warm skin.  Dizziness. Symptoms of moderate dehydration may include:  Very dry mouth.  Muscle cramps.  Dark pee (urine). Pee may be the color of tea.  Your body making less pee.  Your eyes making fewer tears.  Heartbeat that is uneven or faster than normal (palpitations).  Headache.  Light-headedness, especially when you stand up from sitting.  Fainting (syncope). Symptoms of very bad dehydration may include:  Changes in skin, such as: ? Cold and clammy skin. ? Blotchy (mottled) or pale skin. ? Skin that does not quickly return to normal after being lightly pinched and let go (poor skin turgor).  Changes in body fluids, such as: ? Feeling very thirsty. ? Your eyes making fewer tears. ? Not sweating when body temperature is high, such as in hot weather. ? Your body making very little pee.  Changes in vital signs, such as: ? Weak pulse. ? Pulse that is more than 100 beats a minute when you are sitting still. ? Fast breathing. ? Low blood pressure.  Other changes, such as: ? Sunken eyes. ? Cold hands and feet. ? Confusion. ? Lack of energy (lethargy). ? Trouble waking up from sleep. ? Short-term weight loss. ? Unconsciousness. Follow these instructions at home:   If told by your doctor, drink an ORS: ? Make an ORS by using instructions on the package. ? Start by drinking small amounts, about  cup (120 mL) every 5-10 minutes. ? Slowly drink more until you have had the amount that your doctor said to have.  Drink enough clear fluid to keep your pee clear or pale yellow. If you were told to drink an ORS, finish the  ORS first, then start slowly drinking clear fluids. Drink fluids such as: ? Water. Do not drink only water by itself. Doing that can make the salt (sodium) level in your body get too low (hyponatremia). ? Ice chips. ? Fruit juice that you have added water to (diluted). ? Low-calorie sports drinks.  Avoid: ? Alcohol. ? Drinks that have a lot of sugar. These include high-calorie sports drinks, fruit juice that does not have water added, and soda. ? Caffeine. ? Foods that are greasy or have a lot of fat or sugar.  Take over-the-counter and prescription medicines only as told by your doctor.  Do not take salt tablets. Doing that can make the salt level in your body get too high (hypernatremia).  Eat foods that have minerals (electrolytes). Examples include bananas, oranges, potatoes, tomatoes, and spinach.  Keep all follow-up visits as told by your doctor. This is important. Contact a doctor if:  You have belly (abdominal) pain that: ? Gets worse. ? Stays in one area (localizes).  You have a rash.  You have a stiff neck.  You get angry or annoyed more easily than normal (irritability).  You are more sleepy than normal.  You have a harder time waking up than normal.  You feel: ? Weak. ? Dizzy. ? Very thirsty.  You have peed (urinated) only a small amount of very dark pee during 6-8 hours. Get help right away if:  You have   symptoms of very bad dehydration.  You cannot drink fluids without throwing up (vomiting).  Your symptoms get worse with treatment.  You have a fever.  You have a very bad headache.  You are throwing up or having watery poop (diarrhea) and it: ? Gets worse. ? Does not go away.  You have blood or something green (bile) in your throw-up.  You have blood in your poop (stool). This may cause poop to look black and tarry.  You have not peed in 6-8 hours.  You pass out (faint).  Your heart rate when you are sitting still is more than 100 beats a  minute.  You have trouble breathing. This information is not intended to replace advice given to you by your health care provider. Make sure you discuss any questions you have with your health care provider. Document Released: 11/11/2008 Document Revised: 12/28/2016 Document Reviewed: 03/11/2015 Elsevier Patient Education  2020 [place].    Hyperglycemia Hyperglycemia is when the sugar (glucose) level in your blood is too high. It may not cause symptoms. If you do have symptoms, they may include warning signs, such as:  Feeling more thirsty than normal.  Hunger.  Feeling tired.  Needing to pee (urinate) more than normal.  Blurry eyesight (vision). You may get other symptoms as it gets worse, such as:  Dry mouth.  Not being hungry (loss of appetite).  Fruity-smelling breath.  Weakness.  Weight gain or loss that is not planned. Weight loss may be fast.  A tingling or numb feeling in your hands or feet.  Headache.  Skin that does not bounce back quickly when it is lightly pinched and released (poor skin turgor).  Pain in your belly (abdomen).  Cuts or bruises that heal slowly. High blood sugar can happen to people who do or do not have diabetes. High blood sugar can happen slowly or quickly, and it can be an emergency. Follow these instructions at home: General instructions  Take over-the-counter and prescription medicines only as told by your doctor.  Do not use products that contain nicotine or tobacco, such as cigarettes and e-cigarettes. If you need help quitting, ask your doctor.  Limit alcohol intake to no more than 1 drink per day for nonpregnant women and 2 drinks per day for men. One drink equals 12 oz of beer, 5 oz of wine, or 1 oz of hard liquor.  Manage stress. If you need help with this, ask your doctor.  Keep all follow-up visits as told by your doctor. This is important. Eating and drinking   Stay at a healthy weight.  Exercise regularly,  as told by your doctor.  Drink enough fluid, especially when you: ? Exercise. ? Get sick. ? Are in hot temperatures.  Eat healthy foods, such as: ? Low-fat (lean) proteins. ? Complex carbs (complex carbohydrates), such as whole wheat bread or brown rice. ? Fresh fruits and vegetables. ? Low-fat dairy products. ? Healthy fats.  Drink enough fluid to keep your pee (urine) clear or pale yellow. If you have diabetes:   Make sure you know the symptoms of hyperglycemia.  Follow your diabetes management plan, as told by your doctor. Make sure you: ? Take insulin and medicines as told. ? Follow your exercise plan. ? Follow your meal plan. Eat on time. Do not skip meals. ? Check your blood sugar as often as told. Make sure to check before and after exercise. If you exercise longer or in a different way than you  normally do, check your blood sugar more often. ? Follow your sick day plan whenever you cannot eat or drink normally. Make this plan ahead of time with your doctor.  Share your diabetes management plan with people in your workplace, school, and household.  Check your urine for ketones when you are ill and as told by your doctor.  Carry a card or wear jewelry that says that you have diabetes. Contact a doctor if:  Your blood sugar level is higher than 240 mg/dL (13.3 mmol/L) for 2 days in a row.  You have problems keeping your blood sugar in your target range.  High blood sugar happens often for you. Get help right away if:  You have trouble breathing.  You have a change in how you think, feel, or act (mental status).  You feel sick to your stomach (nauseous), and that feeling does not go away.  You cannot stop throwing up (vomiting). These symptoms may be an emergency. Do not wait to see if the symptoms will go away. Get medical help right away. Call your local emergency services (911 in the [place] Do not drive yourself to the hospital. Summary  Hyperglycemia is when  the sugar (glucose) level in your blood is too high.  High blood sugar can happen to people who do or do not have diabetes.  Make sure you drink enough fluids, eat healthy foods, and exercise regularly.  Contact your doctor if you have problems keeping your blood sugar in your target range. This information is not intended to replace advice given to you by your health care provider. Make sure you discuss any questions you have with your health care provider. Document Released: 11/12/2008 Document Revised: 10/03/2015 Document Reviewed: 10/03/2015 Elsevier Patient Education  2020 [employer].

## 2018-08-19 ENCOUNTER — Telehealth: Payer: Self-pay | Admitting: Medical Oncology

## 2018-08-19 ENCOUNTER — Encounter: Payer: Self-pay | Admitting: Hematology and Oncology

## 2018-08-19 ENCOUNTER — Other Ambulatory Visit: Payer: Self-pay | Admitting: Hematology and Oncology

## 2018-08-19 DIAGNOSIS — R64 Cachexia: Secondary | ICD-10-CM

## 2018-08-19 NOTE — Progress Notes (Signed)
[place] OFFICE PROGRESS NOTE  Patient Care Team: Robyne Peers, MD as PCP - General (Family Medicine) Elouise Munroe, MD as PCP - Cardiology (Cardiology)  ASSESSMENT & PLAN:  Metastatic breast cancer [facility]) I had a very long discussion with the patient and then subsequently with her sister as well The patient tolerated chemotherapy very poorly despite multiple dose adjustment and aggressive supportive care I get a sense that social situation is not ideal, despite having advanced home care visit, friends and coworkers and visiting [facility] helping out with her living circumstances, she is still not getting enough supportive care at home For now, I try to redirect the goals to getting her through the side effects from recent treatment I will have further discussion with the patient once she is recovered from treatment about her future plan for therapy The patient is seen twice in the infusion room and over 30 minutes discussion was made with her sister over the telephone.  At the time of her last visit, she is recovered well from nausea, pain and dehydration She will return in 2 days time for repeat blood work and supportive care  Malnutrition of moderate degree The patient appears grossly dehydrated due to recent poor oral intake at home After aggressive IV fluids and antiemetics, the patient was able to tolerate some oral diet prior to discharge We discussed potential home IV infusion The patient is not convinced that herself or her mother are able to handle that We will discuss this further in her next visit  Metastasis to lung 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 She does not need transfusion support today We will recheck her blood count again in 2  days She received 2 units of blood last week We will continue transfusion support if her hemoglobin is less than 8 or if her platelet count is less than 10,000 Despite recent mild hemoptysis at home, I do not recommend platelet transfusion right now  Nausea and vomiting She has profound nausea and vomiting at home despite oral antiemetics With IV fluids and IV antiemetics, her nausea resolves She will continue antiemetics as tolerated at home  Uncontrolled diabetes mellitus with hyperglycemia ([place]) She has poorly controlled diabetes at home Her blood sugar at home is around 400 due to poor dietary choices recently Her blood sugar today is very high She was given additional insulin treatment I gave the patient additional advice of how to adjust her insulin doses as needed based on her blood sugar reading  Goals of care, counseling/discussion I had numerous goals of care discussion with the patient and her sister The patient had refused admission to the hospital, ER visits or nursing home placement despite her poor social circumstances and numerous health issues She had refused physical therapy last week and was not able to engage much with her mother or visiting [facility] due to fatigue, nausea and vomiting Her sister appears to be extremely concerned about her wellbeing We have discussed resources available I will consult advanced home care service for social worker to review her insurance plan to see if additional help can be obtained The patient also have some trust issues with certain care team that were not careful with her needs Ultimately, due to poor social circumstances, it is not clear to me that she would  be eligible for further chemotherapy in the future I will continue goals of care discussion with the patient and family The patient is able to make medical decision She does not want palliative care/hospice at this point   No orders of the defined types were placed in this  encounter.   INTERVAL HISTORY: Please see below for problem oriented charting. She is seen multiple times At first, she was seen in the infusion room.  The patient appears to be in distress due to nausea, pain and feeling unwell She was given a pain medicine, IV fluids and IV antiemetics On her second visit 2 hours later, she felt much better and was able to tolerate some oral intake Her pain control has improved Since last week, she did not feel well.  Her fever stopped 48 hours prior to the clinic visit.  She has completed a course of antibiotics She has uncontrolled nausea & vomiting and was not able to take much oral intake I received multiple phone messages about her distress and feeling unwell She had also dry cough with streaks of blood The patient is adamant not to go to emergency room or hospital for any reason Her oral intake is poor due to nausea and vomiting but despite that, her blood sugar would be running high in the 400 range She has refused other medications. She has refused physical therapy She spent most of her time lying on the lazy chair. Some visitors have commented that she might qualify for palliative care/hospice in hospital bed Upon reviewing some of these documentation, the patient acknowledged some of the truth of her symptoms but was also surprised that people were reporting all the symptoms behind her back.  She is concerned that her sister did not trust that she will be telling the truth. After the patient's improvement with IV fluids and IV antiemetics, she was subsequently discharged with plan to return in 2 days.  I spent almost another 30 minutes discussing the plan of care with the sister as documented above.  SUMMARY OF ONCOLOGIC HISTORY: Oncology History Overview Note  Biopsy is ER negative Her2/neu positive   Metastatic breast cancer ([place])  03/11/2018 Imaging   Ct scan of chest, abdomen and pelvis 1. 2.6 x 2.3 cm spiculated mass identified  inferomedial quadrant of the left breast. Lesion appears to retract the overlying skin. Mammographic correlation recommended. 2. Small lymph nodes in the left axillar ill-defined and suspicious for metastatic disease. 3. Multiple bilateral pulmonary nodules measuring up to 2.7 cm diameter. These probably represent metastatic disease. Given the dominant size of the central right upper lobe lesion, synchronous lung primary can not be completely excluded. 4. Multiple ill-defined liver lesions compatible with metastatic disease. Dominant liver metastases measure up to almost 4 cm. 5.  Aortic Atherosclerois (ICD10-170.0)    03/12/2018 Pathology Results   Liver, needle/core biopsy, left lobe - METASTATIC CARCINOMA. - LYMPHOVASCULAR INVASION IS IDENTIFIED. - SEE COMMENT. Microscopic Comment The tumor cells are positive for cytokeratin 7. There is faint staining for GATA-3. There is non-specific staining for TTF-1. Cytokeratin 20, CDX-2, estrogen receptor, and GCDFP stains are negative. Given the clinical suspicion, the profile supports a primary breast carcinoma. Her2 will be performed and the results reported separately.  By immunohistochemistry, the tumor cells are POSITIVE for Her2 (3+).   03/12/2018 Imaging   Single 1.3 x 1.4 cm RIGHT occipital metastasis.  Borderline pachymeningeal enhancement of symmetric nature could be related to the superficial metastasis or osseous disease.   03/12/2018  Procedure   Ultrasound-guided core biopsy performed of a mass within the lateral segment of the left lobe of the liver.   03/16/2018 Imaging   Right occipital lobe 13 x 16 mm. Small satellite enhancing nodule deep to the larger lesion. The larger lesion shows central necrosis and probable mild hemorrhage or calcification.  Mild dural enhancement is less impressive and could be within normal limits.  Bone marrow in the clivus and cervical spine is diffusely low signal. No focal lesion. This may be  related to the patient's anemia and abnormal blood count.   03/17/2018 Cancer Staging   Staging form: Breast, AJCC 8th Edition - Clinical: Stage IV (cT2, cN0, pM1, GX, ER-, PR: Not Assessed, HER2+) - Signed by [name], MD on 03/17/2018   03/19/2018 -  Chemotherapy   She received chemo with Taxotere, Herceptin and Perjeta   03/21/2018 Echocardiogram   IMPRESSIONS 1. The left ventricle has normal systolic function with an ejection fraction of 60-65%. The cavity size was normal. Left ventricular diastolic Doppler parameters are consistent with impaired relaxation.  2. The right ventricle has normal systolic function. The cavity was normal. There is no increase in right ventricular wall thickness.  3. The mitral valve is normal in structure.  4. The tricuspid valve is normal in structure.  5. Strain imaging performed but not reported due to interpreter judgement, secondary to suboptimal image quality.   03/24/2018 Procedure   Successful placement of a right IJ approach Power Port with ultrasound and fluoroscopic guidance. The catheter is ready for use.   04/07/2018 - 04/16/2018 Hospital Admission   She was admitted for pneumonia   04/07/2018 - 04/16/2018 Hospital Admission   She was admitted to the hospital for pneumonia and respiratory failure   04/25/2018 Imaging   Retropharyngeal fluid collection compatible with effusion or possibly abscess. Soft tissue thickening extends into the right neck and surrounds the right carotid bifurcation and right internal carotid artery. There is also streaky density in the right lateral neck soft tissues with a focal 7.6 Mm lymph node in the right lateral neck. Stranding surrounds the right submandibular gland.   Findings are most compatible with infection. Favor pharyngitis with retropharyngeal effusion/abscess. Soft tissue thickening around the right carotid most compatible with infection rather than tumor. Right jugular vein is patent.  Multiple nodular  densities in the right upper lobe may represent residual pneumonia or metastatic disease. Interval improvement in right upper lobe infiltrate compared with CT of 04/08/2018   04/25/2018 - 05/06/2018 Hospital Admission   She was admitted for retropharyngeal abscess   04/27/2018 Imaging   No evidence of large central pulmonary embolus is noted in the main pulmonary artery or the main portions of the right and left pulmonary arteries. However, evaluation of the lower lobe branches, particularly on the right, is limited due to respiratory motion artifact and other limiting issues. Pulmonary emboli and smaller peripheral branches can not be excluded on the basis of this exam.  Large left pleural effusion is noted with complete atelectasis of the left lower lobe. Increased left upper lobe opacity is noted posteriorly concerning for atelectasis or pneumonia.  Improved right upper lobe and lower lobe opacities are noted suggesting improving pneumonia or atelectasis.  15 mm right paratracheal lymph node is noted which is enlarged compared to prior exam; it is uncertain if this is metastatic or inflammatory in etiology.  Hepatic metastatic lesions are again noted.  Aortic Atherosclerosis (ICD10-I70.0).    04/27/2018 Procedure   Successful ultrasound  guided left thoracentesis yielding 120 mL of blood-tinged of pleural fluid.   06/11/2018 Imaging   1. No evidence of pulmonary emboli. 2. Extensive bilateral lower lobe consolidation. 3. Small pleural effusions. 4. 2.5 cm right upper lobe nodule, enlarged from 04/27/2018 and which may reflect a metastasis or primary lung cancer. Stable to mild enlargement of subcentimeter nodules in both upper lobes consistent with known metastases. 5. Decreased size of liver metastases. 6. Unchanged mild mediastinal and right hilar lymphadenopathy. 7. Aortic Atherosclerosis (ICD10-I70.0).   06/11/2018 - 06/22/2018 Hospital Admission   She was hospitalized for  infection and SVT   07/18/2018 Imaging   MRI brain 1. Regression of solitary right occipital lobe metastasis and edema. No new brain metastasis. 2. New ill-defined enhancement at the atlantooccipital membrane, presumably a metastatic focus. The adjacent bone marrow is abnormally low signal but not enhancing to the degree of the extraosseous lesion. Marrow signal is likely from anemia.     Metastasis to brain ([place])  03/17/2018 Initial Diagnosis   Metastasis to brain [facility])   Metastasis to liver ([place])  03/17/2018 Initial Diagnosis   Metastasis to liver [facility])   Metastasis to bone ([place])  03/17/2018 Initial Diagnosis   Metastasis to bone ([facility])     REVIEW OF SYSTEMS:   Eyes: Denies blurriness of vision Ears, nose, mouth, throat, and face: Denies mucositis or sore throat Cardiovascular: Denies palpitation, chest discomfort or lower extremity swelling Skin: Denies abnormal skin rashes Lymphatics: Denies new lymphadenopathy or easy bruising Behavioral/Psych: Mood is stable, no new changes  All other systems were reviewed with the patient and are negative.  I have reviewed the past medical history, past surgical history, social history and family history with the patient and they are unchanged from previous note.  ALLERGIES:  is allergic to nsaids.  MEDICATIONS:  Current Outpatient Medications  Medication Sig Dispense Refill  . ACCU-CHEK AVIVA PLUS test strip     . ACCU-CHEK SOFTCLIX LANCETS lancets     . acetaminophen (TYLENOL) 325 MG tablet Take 2 tablets (650 mg total) by mouth every 6 (six) hours as needed for mild pain (or Fever >/= 101). 30 tablet 0  . albuterol (PROAIR HFA) 108 (90 Base) MCG/ACT inhaler Inhale 2 puffs into the lungs every 6 (six) hours as needed for wheezing or shortness of breath. 1 Inhaler 1  . amiodarone (PACERONE) 200 MG tablet TAKE 1 TABLET BY MOUTH EVERY DAY 30 tablet 0  . benzonatate (TESSALON) 200 MG capsule Take 1 capsule (200 mg total) by mouth 3 (three)  times daily as needed for cough (use second). 20 capsule 0  . blood glucose meter kit and supplies KIT Dispense based on patient and insurance preference. Use up to four times daily as directed. (FOR ICD-9 250.00, 250.01). 1 each 0  . ciprofloxacin (CIPRO) 250 MG tablet Take 1 tablet (250 mg total) by mouth 2 (two) times daily. 10 tablet 0  . esomeprazole (NEXIUM) 40 MG capsule Take 1 capsule (40 mg total) by mouth daily at 12 noon. 90 capsule 3  . feeding supplement, ENSURE ENLIVE, (ENSURE ENLIVE) LIQD Take 237 mLs by mouth 3 (three) times daily between meals. 237 mL 12  . fluticasone (FLOVENT HFA) 44 MCG/ACT inhaler Inhale 2 puffs into the lungs 2 (two) times daily. 3 Inhaler 1  . insulin aspart (NOVOLOG) 100 UNIT/ML FlexPen Inject 8 Units into the skin 3 (three) times daily with meals. (Patient taking differently: Inject 8 Units into the skin 3 (three) times daily with  meals. Holds dose if not going to eat.) 15 mL 11  . Insulin Detemir (LEVEMIR) 100 UNIT/ML Pen Inject 10 Units into the skin daily. (Patient taking differently: Inject 10 Units into the skin at bedtime. ) 15 mL 0  . Insulin Pen Needle 31G X 5 MM MISC 1 Device by Does not apply route QID. For use with insulin pens 100 each 0  . lidocaine-prilocaine (EMLA) cream Apply to affected area once (Patient taking differently: Apply 1 application topically as needed (port access). Apply to affected area once) 30 g 3  . LORazepam (ATIVAN) 0.5 MG tablet Take 1 tab po 30 minutes prior to radiation or MRI (Patient taking differently: Take 0.5 mg by mouth See admin instructions. Take 30 minutes prior to radiation or MRI) 30 tablet 0  . metFORMIN (GLUCOPHAGE) 500 MG tablet Take 500 mg by mouth daily with breakfast.     . metoprolol tartrate (LOPRESSOR) 25 MG tablet Take 0.5 tablets (12.5 mg total) by mouth 2 (two) times daily. 60 tablet 0  . mirtazapine (REMERON) 30 MG tablet TAKE 1 TABLET (30 MG TOTAL) BY MOUTH AT BEDTIME. 90 tablet 7  . montelukast  (SINGULAIR) 10 MG tablet Take 1 tablet (10 mg total) by mouth daily. 90 tablet 1  . morphine (MSIR) 15 MG tablet Take 1 tablet (15 mg total) by mouth every 4 (four) hours as needed for severe pain. 60 tablet 0  . Multiple Vitamin (MULTIVITAMIN WITH MINERALS) TABS tablet Take 1 tablet by mouth daily.    . ondansetron (ZOFRAN) 8 MG tablet Take 8 mg by mouth every 8 (eight) hours as needed for nausea or vomiting.     . prochlorperazine (COMPAZINE) 10 MG tablet Take 10 mg by mouth every 6 (six) hours as needed for nausea or vomiting.      No current facility-administered medications for this visit.    Facility-Administered Medications Ordered in Other Visits  Medication Dose Route Frequency Provider Last Rate Last Dose  . heparin lock flush 100 unit/mL  250 Units Intracatheter PRN Shamikia Linskey, MD      . sodium chloride flush (NS) 0.9 % injection 3 mL  3 mL Intracatheter PRN Alvy Bimler, Tallen Schnorr, MD        PHYSICAL EXAMINATION: ECOG PERFORMANCE STATUS: 3 - Symptomatic, >50% confined to bed GENERAL:alert, she looks pale and in severe distress SKIN: skin color, texture, turgor are normal, no rashes or significant lesions EYES: normal, Conjunctiva are pink and non-injected, sclera clear OROPHARYNX:no exudate, no erythema and lips, buccal mucosa, and tongue normal  NECK: supple, thyroid normal size, non-tender, without nodularity LYMPH:  no palpable lymphadenopathy in the cervical, axillary or inguinal LUNGS: clear to auscultation and percussion with normal breathing effort HEART: Tachycardia, and no murmurs with mild bilateral lower extremity edema ABDOMEN:abdomen soft, non-tender and normal bowel sounds Musculoskeletal:no cyanosis of digits and no clubbing  NEURO: alert & oriented x 3 with fluent speech, no focal motor/sensory deficits  LABORATORY DATA:  I have reviewed the data as listed    Component Value Date/Time   NA 135 08/18/2018 1001   K 5.2 (H) 08/18/2018 1001   CL 99 08/18/2018 1001    CO2 25 08/18/2018 1001   GLUCOSE 366 (H) 08/18/2018 1001   BUN 28 (H) 08/18/2018 1001   CREATININE 0.71 08/18/2018 1001   CALCIUM 8.7 (L) 08/18/2018 1001   PROT 6.5 08/18/2018 1001   ALBUMIN 1.8 (L) 08/18/2018 1001   AST 15 08/18/2018 1001   ALT 24 08/18/2018 1001  ALKPHOS 435 (H) 08/18/2018 1001   BILITOT 2.3 (H) 08/18/2018 1001   GFRNONAA >60 08/18/2018 1001   GFRAA >60 08/18/2018 1001    No results found for: SPEP, UPEP  Lab Results  Component Value Date   WBC 11.9 (H) 08/18/2018   NEUTROABS 6.5 08/18/2018   HGB 8.6 (L) 08/18/2018   HCT 27.5 (L) 08/18/2018   MCV 87.0 08/18/2018   PLT 22 (L) 08/18/2018      Chemistry      Component Value Date/Time   NA 135 08/18/2018 1001   K 5.2 (H) 08/18/2018 1001   CL 99 08/18/2018 1001   CO2 25 08/18/2018 1001   BUN 28 (H) 08/18/2018 1001   CREATININE 0.71 08/18/2018 1001      Component Value Date/Time   CALCIUM 8.7 (L) 08/18/2018 1001   ALKPHOS 435 (H) 08/18/2018 1001   AST 15 08/18/2018 1001   ALT 24 08/18/2018 1001   BILITOT 2.3 (H) 08/18/2018 1001      All questions were answered. The patient knows to call the clinic with any problems, questions or concerns. No barriers to learning was detected.  I spent 60 minutes counseling the patient face to face. The total time spent in the appointment was 80 minutes and more than 50% was on counseling and review of test results  [name], MD 08/19/2018 1:59 PM

## 2018-08-19 NOTE — Assessment & Plan Note (Signed)
She has poorly controlled diabetes at home Her blood sugar at home is around 400 due to poor dietary choices recently Her blood sugar today is very high She was given additional insulin treatment I gave the patient additional advice of how to adjust her insulin doses as needed based on her blood sugar reading

## 2018-08-19 NOTE — Assessment & Plan Note (Signed)
I had a very long discussion with the patient and then subsequently with her sister as well The patient tolerated chemotherapy very poorly despite multiple dose adjustment and aggressive supportive care I get a sense that social situation is not ideal, despite having advanced home care visit, friends and coworkers and visiting [facility] helping out with her living circumstances, she is still not getting enough supportive care at home For now, I try to redirect the goals to getting her through the side effects from recent treatment I will have further discussion with the patient once she is recovered from treatment about her future plan for therapy The patient is seen twice in the infusion room and over 30 minutes discussion was made with her sister over the telephone.  At the time of her last visit, she is recovered well from nausea, pain and dehydration She will return in 2 days time for repeat blood work and supportive care

## 2018-08-19 NOTE — Assessment & Plan Note (Signed)
The patient appears grossly dehydrated due to recent poor oral intake at home After aggressive IV fluids and antiemetics, the patient was able to tolerate some oral diet prior to discharge We discussed potential home IV infusion The patient is not convinced that herself or her mother are able to handle that We will discuss this further in her next visit

## 2018-08-19 NOTE — Assessment & Plan Note (Addendum)
She has mild chronic cough secondary to her pulmonary metastasis The mild hemoptysis noted at home is related to her thrombocytopenia We will repeat CT imaging next month

## 2018-08-19 NOTE — Assessment & Plan Note (Signed)
The patient is transfusion dependent She does not need transfusion support today We will recheck her blood count again in 2 days She received 2 units of blood last week We will continue transfusion support if her hemoglobin is less than 8 or if her platelet count is less than 10,000 Despite recent mild hemoptysis at home, I do not recommend platelet transfusion right now

## 2018-08-19 NOTE — Assessment & Plan Note (Signed)
She has profound nausea and vomiting at home despite oral antiemetics With IV fluids and IV antiemetics, her nausea resolves She will continue antiemetics as tolerated at home

## 2018-08-19 NOTE — Telephone Encounter (Signed)
Called pt regarding below message.  Pt's voice audible, clear and alert.  She states she has had some broth and a Glucerna today and "I am working on it" regarding more hydration.  Pt is aware of appts tomorrow and feels at this time that she will be able to be here.

## 2018-08-19 NOTE — Telephone Encounter (Signed)
RN Home visit today  T=98.6, P-136, Resp 28, BP=- 92/56 , 97% on 3 liters oxygen. Drank 3, 16 oz fluids today and ate cereal. . Weak, No dizziness.  Appts tomorrow scheduled for lab, blood transfusion and Dr Alvy Bimler.

## 2018-08-19 NOTE — Assessment & Plan Note (Signed)
I had numerous goals of care discussion with the patient and her sister The patient had refused admission to the hospital, ER visits or nursing home placement despite her poor social circumstances and numerous health issues She had refused physical therapy last week and was not able to engage much with her mother or visiting [facility] due to fatigue, nausea and vomiting Her sister appears to be extremely concerned about her wellbeing We have discussed resources available I will consult advanced home care service for social worker to review her insurance plan to see if additional help can be obtained The patient also have some trust issues with certain care team that were not careful with her needs Ultimately, due to poor social circumstances, it is not clear to me that she would be eligible for further chemotherapy in the future I will continue goals of care discussion with the patient and family The patient is able to make medical decision She does not want palliative care/hospice at this point

## 2018-08-20 ENCOUNTER — Telehealth: Payer: Self-pay | Admitting: Hematology and Oncology

## 2018-08-20 ENCOUNTER — Other Ambulatory Visit: Payer: Self-pay

## 2018-08-20 ENCOUNTER — Other Ambulatory Visit: Payer: Self-pay | Admitting: Hematology and Oncology

## 2018-08-20 ENCOUNTER — Inpatient Hospital Stay: Payer: BC Managed Care – PPO

## 2018-08-20 ENCOUNTER — Inpatient Hospital Stay (HOSPITAL_BASED_OUTPATIENT_CLINIC_OR_DEPARTMENT_OTHER): Payer: BC Managed Care – PPO | Admitting: Hematology and Oncology

## 2018-08-20 VITALS — BP 89/59 | HR 129 | Temp 97.6°F | Resp 20

## 2018-08-20 DIAGNOSIS — C787 Secondary malignant neoplasm of liver and intrahepatic bile duct: Secondary | ICD-10-CM

## 2018-08-20 DIAGNOSIS — Z171 Estrogen receptor negative status [ER-]: Secondary | ICD-10-CM

## 2018-08-20 DIAGNOSIS — D6481 Anemia due to antineoplastic chemotherapy: Secondary | ICD-10-CM

## 2018-08-20 DIAGNOSIS — E860 Dehydration: Secondary | ICD-10-CM

## 2018-08-20 DIAGNOSIS — C7931 Secondary malignant neoplasm of brain: Secondary | ICD-10-CM

## 2018-08-20 DIAGNOSIS — Z7951 Long term (current) use of inhaled steroids: Secondary | ICD-10-CM

## 2018-08-20 DIAGNOSIS — C7801 Secondary malignant neoplasm of right lung: Secondary | ICD-10-CM

## 2018-08-20 DIAGNOSIS — C50912 Malignant neoplasm of unspecified site of left female breast: Secondary | ICD-10-CM | POA: Diagnosis not present

## 2018-08-20 DIAGNOSIS — C7951 Secondary malignant neoplasm of bone: Secondary | ICD-10-CM

## 2018-08-20 DIAGNOSIS — C50919 Malignant neoplasm of unspecified site of unspecified female breast: Secondary | ICD-10-CM

## 2018-08-20 DIAGNOSIS — Z794 Long term (current) use of insulin: Secondary | ICD-10-CM

## 2018-08-20 DIAGNOSIS — D61818 Other pancytopenia: Secondary | ICD-10-CM

## 2018-08-20 DIAGNOSIS — E43 Unspecified severe protein-calorie malnutrition: Secondary | ICD-10-CM

## 2018-08-20 DIAGNOSIS — Z79899 Other long term (current) drug therapy: Secondary | ICD-10-CM

## 2018-08-20 DIAGNOSIS — D61810 Antineoplastic chemotherapy induced pancytopenia: Secondary | ICD-10-CM

## 2018-08-20 DIAGNOSIS — R5381 Other malaise: Secondary | ICD-10-CM

## 2018-08-20 DIAGNOSIS — E1165 Type 2 diabetes mellitus with hyperglycemia: Secondary | ICD-10-CM

## 2018-08-20 DIAGNOSIS — C7800 Secondary malignant neoplasm of unspecified lung: Secondary | ICD-10-CM

## 2018-08-20 DIAGNOSIS — T451X5A Adverse effect of antineoplastic and immunosuppressive drugs, initial encounter: Secondary | ICD-10-CM

## 2018-08-20 LAB — CMP (CANCER CENTER ONLY)
ALT: 16 U/L (ref 0–44)
AST: 19 U/L (ref 15–41)
Albumin: 1.8 g/dL — ABNORMAL LOW (ref 3.5–5.0)
Alkaline Phosphatase: 472 U/L — ABNORMAL HIGH (ref 38–126)
Anion gap: 10 (ref 5–15)
BUN: 19 mg/dL (ref 6–20)
CO2: 25 mmol/L (ref 22–32)
Calcium: 8.4 mg/dL — ABNORMAL LOW (ref 8.9–10.3)
Chloride: 101 mmol/L (ref 98–111)
Creatinine: 0.68 mg/dL (ref 0.44–1.00)
GFR, Est AFR Am: >60 mL/min (ref >60)
GFR, Estimated: >60 mL/min (ref >60)
Glucose, Bld: 312 mg/dL — ABNORMAL HIGH (ref 70–99)
Potassium: 4.5 mmol/L (ref 3.5–5.1)
Sodium: 136 mmol/L (ref 135–145)
Total Bilirubin: 1.2 mg/dL (ref 0.3–1.2)
Total Protein: 6.1 g/dL — ABNORMAL LOW (ref 6.5–8.1)

## 2018-08-20 LAB — CBC WITH DIFFERENTIAL (CANCER CENTER ONLY)
Abs Immature Granulocytes: 1.2 10*3/uL — ABNORMAL HIGH (ref 0.00–0.07)
Band Neutrophils: 11 %
Basophils Absolute: 0.5 10*3/uL — ABNORMAL HIGH (ref 0.0–0.1)
Basophils Relative: 4 %
Eosinophils Absolute: 0.2 10*3/uL (ref 0.0–0.5)
Eosinophils Relative: 2 %
HCT: 24.5 % — ABNORMAL LOW (ref 36.0–46.0)
Hemoglobin: 7.6 g/dL — ABNORMAL LOW (ref 12.0–15.0)
Lymphocytes Relative: 15 %
Lymphs Abs: 1.8 10*3/uL (ref 0.7–4.0)
MCH: 27 pg (ref 26.0–34.0)
MCHC: 31 g/dL (ref 30.0–36.0)
MCV: 87.2 fL (ref 80.0–100.0)
Metamyelocytes Relative: 6 %
Monocytes Absolute: 0.8 10*3/uL (ref 0.1–1.0)
Monocytes Relative: 7 %
Myelocytes: 4 %
Neutro Abs: 7.5 10*3/uL (ref 1.7–17.7)
Neutrophils Relative %: 51 %
Platelet Count: 18 10*3/uL — ABNORMAL LOW (ref 150–400)
RBC: 2.81 MIL/uL — ABNORMAL LOW (ref 3.87–5.11)
RDW: 17.5 % — ABNORMAL HIGH (ref 11.5–15.5)
WBC Count: 12.1 10*3/uL — ABNORMAL HIGH (ref 4.0–10.5)
nRBC: 0.4 % — ABNORMAL HIGH (ref 0.0–0.2)

## 2018-08-20 LAB — SAMPLE TO BLOOD BANK

## 2018-08-20 LAB — PREPARE RBC (CROSSMATCH)

## 2018-08-20 MED ORDER — DIPHENHYDRAMINE HCL 25 MG PO CAPS
ORAL_CAPSULE | ORAL | Status: AC
  Filled 2018-08-20: qty 1

## 2018-08-20 MED ORDER — SODIUM CHLORIDE 0.9% FLUSH
10.0000 mL | INTRAVENOUS | Status: AC | PRN
  Administered 2018-08-20: 13:00:00 10 mL
  Filled 2018-08-20: qty 10

## 2018-08-20 MED ORDER — ACETAMINOPHEN 325 MG PO TABS
650.0000 mg | ORAL_TABLET | Freq: Once | ORAL | Status: AC
  Administered 2018-08-20: 650 mg via ORAL

## 2018-08-20 MED ORDER — MORPHINE SULFATE 15 MG PO TABS: 15.0000 mg | ORAL_TABLET | ORAL | 0 refills | Status: DC | PRN

## 2018-08-20 MED ORDER — SODIUM CHLORIDE 0.9 % IV SOLN
Freq: Once | INTRAVENOUS | Status: AC
  Administered 2018-08-20: 11:00:00 via INTRAVENOUS
  Filled 2018-08-20: qty 250

## 2018-08-20 MED ORDER — SODIUM CHLORIDE 0.9% IV SOLUTION
250.0000 mL | Freq: Once | INTRAVENOUS | Status: AC
  Administered 2018-08-20: 250 mL via INTRAVENOUS
  Filled 2018-08-20: qty 250

## 2018-08-20 MED ORDER — DIPHENHYDRAMINE HCL 25 MG PO CAPS
25.0000 mg | ORAL_CAPSULE | Freq: Once | ORAL | Status: AC
  Administered 2018-08-20: 25 mg via ORAL

## 2018-08-20 MED ORDER — ACETAMINOPHEN 325 MG PO TABS
ORAL_TABLET | ORAL | Status: AC
  Filled 2018-08-20: qty 2

## 2018-08-20 MED ORDER — INSULIN REGULAR HUMAN 100 UNIT/ML IJ SOLN
16.0000 [IU] | Freq: Once | INTRAMUSCULAR | Status: AC
  Administered 2018-08-20: 16 [IU] via SUBCUTANEOUS
  Filled 2018-08-20: qty 10

## 2018-08-20 MED ORDER — ONDANSETRON HCL 8 MG PO TABS: 8.0000 mg | ORAL_TABLET | Freq: Three times a day (TID) | ORAL | 1 refills | Status: AC | PRN

## 2018-08-20 MED ORDER — HEPARIN SOD (PORK) LOCK FLUSH 100 UNIT/ML IV SOLN
500.0000 [IU] | Freq: Every day | INTRAVENOUS | Status: AC | PRN
  Administered 2018-08-20: 13:00:00 500 [IU]
  Filled 2018-08-20: qty 5

## 2018-08-20 MED ORDER — SODIUM CHLORIDE 0.9% FLUSH
10.0000 mL | Freq: Once | INTRAVENOUS | Status: DC | PRN
  Filled 2018-08-20: qty 10

## 2018-08-20 MED ORDER — HEPARIN SOD (PORK) LOCK FLUSH 100 UNIT/ML IV SOLN
500.0000 [IU] | Freq: Once | INTRAVENOUS | Status: DC | PRN
  Filled 2018-08-20: qty 5

## 2018-08-20 NOTE — Patient Instructions (Signed)

## 2018-08-20 NOTE — Telephone Encounter (Signed)
Scheduled appt per 7/22 sch message - pt to get an updated schedule in treatment area. Per RN loren to print

## 2018-08-20 NOTE — Patient Instructions (Signed)
Blood Transfusion, Adult  A blood transfusion is a procedure in which you receive donated blood, including plasma, platelets, and red blood cells, through an IV tube. You may need a blood transfusion because of illness, surgery, or injury. The blood may come from a donor. You may also be able to donate blood for yourself (autologous blood donation) before a surgery if you know that you might require a blood transfusion. The blood given in a transfusion is made up of different types of cells. You may receive:  Red blood cells. These carry oxygen to the cells in the body.  White blood cells. These help you fight infections.  Platelets. These help your blood to clot.  Plasma. This is the liquid part of your blood and it helps with fluid imbalances. If you have hemophilia or another clotting disorder, you may also receive other types of blood products. Tell a health care provider about:  Any allergies you have.  All medicines you are taking, including vitamins, herbs, eye drops, creams, and over-the-counter medicines.  Any problems you or family members have had with anesthetic medicines.  Any blood disorders you have.  Any surgeries you have had.  Any medical conditions you have, including any recent fever or cold symptoms.  Whether you are pregnant or may be pregnant.  Any previous reactions you have had during a blood transfusion. What are the risks? Generally, this is a safe procedure. However, problems may occur, including:  Having an allergic reaction to something in the donated blood. Hives and itching may be symptoms of this type of reaction.  Fever. This may be a reaction to the white blood cells in the transfused blood. Nausea or chest pain may accompany a fever.  Iron overload. This can happen from having many transfusions.  Transfusion-related acute lung injury (TRALI). This is a rare reaction that causes lung damage. The cause is not known.TRALI can occur within hours  of a transfusion or several days later.  Sudden (acute) or delayed hemolytic reactions. This happens if your blood does not match the cells in your transfusion. Your bodys defense system (immune system) may try to attack the new cells. This complication is rare. The symptoms include fever, chills, nausea, and low back pain or chest pain.  Infection or disease transmission. This is rare. What happens before the procedure?  You will have a blood test to determine your blood type. This is necessary to know what kind of blood your body will accept and to match it to the donor blood.  If you are going to have a planned surgery, you may be able to do an autologous blood donation. This may be done in case you need to have a transfusion.  If you have had an allergic reaction to a transfusion in the past, you may be given medicine to help prevent a reaction. This medicine may be given to you by mouth or through an IV tube.  You will have your temperature, blood pressure, and pulse monitored before the transfusion.  Follow instructions from your health care provider about eating and drinking restrictions.  Ask your health care provider about: ? Changing or stopping your regular medicines. This is especially important if you are taking diabetes medicines or blood thinners. ? Taking medicines such as aspirin and ibuprofen. These medicines can thin your blood. Do not take these medicines before your procedure if your health care provider instructs you not to. What happens during the procedure?  An IV tube will be  inserted into one of your veins.  The bag of donated blood will be attached to your IV tube. The blood will then enter through your vein.  Your temperature, blood pressure, and pulse will be monitored regularly during the transfusion. This monitoring is done to detect early signs of a transfusion reaction.  If you have any signs or symptoms of a reaction, your transfusion will be stopped and  you may be given medicine.  When the transfusion is complete, your IV tube will be removed.  Pressure may be applied to the IV site for a few minutes.  A bandage (dressing) will be applied. The procedure may vary among health care providers and hospitals. What happens after the procedure?  Your temperature, blood pressure, heart rate, breathing rate, and blood oxygen level will be monitored often.  Your blood may be tested to see how you are responding to the transfusion.  You may be warmed with fluids or blankets to maintain a normal body temperature. Summary  A blood transfusion is a procedure in which you receive donated blood, including plasma, platelets, and red blood cells, through an IV tube.  Your temperature, blood pressure, and pulse will be monitored before, during, and after the transfusion.  Your blood may be tested after the transfusion to see how your body has responded. This information is not intended to replace advice given to you by your health care provider. Make sure you discuss any questions you have with your health care provider. Document Released: 01/13/2000 Document Revised: 12/02/2017 Document Reviewed: 10/13/2015 Elsevier Patient Education  [place].   Dehydration, Adult  Dehydration is when there is not enough fluid or water in your body. This happens when you lose more fluids than you take in. Dehydration can range from mild to very bad. It should be treated right away to keep it from getting very bad. Symptoms of mild dehydration may include:  Thirst.  Dry lips.  Slightly dry mouth.  Dry, warm skin.  Dizziness. Symptoms of moderate dehydration may include:  Very dry mouth.  Muscle cramps.  Dark pee (urine). Pee may be the color of tea.  Your body making less pee.  Your eyes making fewer tears.  Heartbeat that is uneven or faster than normal (palpitations).  Headache.  Light-headedness, especially when you stand up from  sitting.  Fainting (syncope). Symptoms of very bad dehydration may include:  Changes in skin, such as: ? Cold and clammy skin. ? Blotchy (mottled) or pale skin. ? Skin that does not quickly return to normal after being lightly pinched and let go (poor skin turgor).  Changes in body fluids, such as: ? Feeling very thirsty. ? Your eyes making fewer tears. ? Not sweating when body temperature is high, such as in hot weather. ? Your body making very little pee.  Changes in vital signs, such as: ? Weak pulse. ? Pulse that is more than 100 beats a minute when you are sitting still. ? Fast breathing. ? Low blood pressure.  Other changes, such as: ? Sunken eyes. ? Cold hands and feet. ? Confusion. ? Lack of energy (lethargy). ? Trouble waking up from sleep. ? Short-term weight loss. ? Unconsciousness. Follow these instructions at home:   If told by your doctor, drink an ORS: ? Make an ORS by using instructions on the package. ? Start by drinking small amounts, about  cup (120 mL) every 5-10 minutes. ? Slowly drink more until you have had the amount that your doctor  said to have.  Drink enough clear fluid to keep your pee clear or pale yellow. If you were told to drink an ORS, finish the ORS first, then start slowly drinking clear fluids. Drink fluids such as: ? Water. Do not drink only water by itself. Doing that can make the salt (sodium) level in your body get too low (hyponatremia). ? Ice chips. ? Fruit juice that you have added water to (diluted). ? Low-calorie sports drinks.  Avoid: ? Alcohol. ? Drinks that have a lot of sugar. These include high-calorie sports drinks, fruit juice that does not have water added, and soda. ? Caffeine. ? Foods that are greasy or have a lot of fat or sugar.  Take over-the-counter and prescription medicines only as told by your doctor.  Do not take salt tablets. Doing that can make the salt level in your body get too high  (hypernatremia).  Eat foods that have minerals (electrolytes). Examples include bananas, oranges, potatoes, tomatoes, and spinach.  Keep all follow-up visits as told by your doctor. This is important. Contact a doctor if:  You have belly (abdominal) pain that: ? Gets worse. ? Stays in one area (localizes).  You have a rash.  You have a stiff neck.  You get angry or annoyed more easily than normal (irritability).  You are more sleepy than normal.  You have a harder time waking up than normal.  You feel: ? Weak. ? Dizzy. ? Very thirsty.  You have peed (urinated) only a small amount of very dark pee during 6-8 hours. Get help right away if:  You have symptoms of very bad dehydration.  You cannot drink fluids without throwing up (vomiting).  Your symptoms get worse with treatment.  You have a fever.  You have a very bad headache.  You are throwing up or having watery poop (diarrhea) and it: ? Gets worse. ? Does not go away.  You have blood or something green (bile) in your throw-up.  You have blood in your poop (stool). This may cause poop to look black and tarry.  You have not peed in 6-8 hours.  You pass out (faint).  Your heart rate when you are sitting still is more than 100 beats a minute.  You have trouble breathing. This information is not intended to replace advice given to you by your health care provider. Make sure you discuss any questions you have with your health care provider. Document Released: 11/11/2008 Document Revised: 12/28/2016 Document Reviewed: 03/11/2015 Elsevier Patient Education  2020 [employer].

## 2018-08-21 ENCOUNTER — Telehealth: Payer: Self-pay | Admitting: Hematology and Oncology

## 2018-08-21 ENCOUNTER — Encounter: Payer: Self-pay | Admitting: Hematology and Oncology

## 2018-08-21 LAB — BPAM RBC
Blood Product Expiration Date: 202008152359
ISSUE DATE / TIME: 202007221001
Unit Type and Rh: 6200

## 2018-08-21 LAB — TYPE AND SCREEN
ABO/RH(D): A POS
Antibody Screen: NEGATIVE
Unit division: 0

## 2018-08-21 NOTE — Assessment & Plan Note (Signed)
Her cough and shortness of breath is improved I plan to repeat imaging study next month

## 2018-08-21 NOTE — Assessment & Plan Note (Signed)
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

## 2018-08-21 NOTE — Assessment & Plan Note (Signed)
She is very weak and debilitated but improved compared to her last visit [facility] made contact with the patient who has declined additional services at home She will continue home PT OT when able She is wondering whether she can return back to work Given her significant debility, I do not think this is realistic but hopefully with continuous improvement in the future, she might be able to either return back to work or apply for permanent disability

## 2018-08-21 NOTE — Progress Notes (Signed)
[place] OFFICE PROGRESS NOTE  Patient Care Team: Robyne Peers, MD as PCP - General (Family Medicine) Elouise Munroe, MD as PCP - Cardiology (Cardiology)  ASSESSMENT & PLAN:  Metastatic breast cancer [facility]) She has significant complication from each cycle of treatment despite dose adjustment She is slowly recovering from side effects of therapy She is not ready to resume chemotherapy I will have further discussion with the patient next week about plan of care  Metastasis to lung [facility]) Her cough and shortness of breath is improved I plan to repeat imaging study next month  Pancytopenia, acquired ([place]) She has pancytopenia due to her bone marrow disease and recent chemotherapy I recommend blood transfusion She does not need platelet transfusion now  Uncontrolled diabetes mellitus with hyperglycemia ([place]) She continues to have poorly controlled diabetes She will get insulin therapy here We discussed the importance of frequent monitoring of her blood sugar and adjustment of her insulin as needed  Physical debility She is very weak and debilitated but improved compared to her last visit [facility] has made contact with the patient who has declined additional services at home She will continue home PT OT when able She is wondering whether she can return back to work Given her significant debility, I do not think this is realistic but hopefully with continuous improvement in the future, she might be able to either return back to work or apply for permanent disability   Orders Placed This Encounter  Procedures  . Prepare RBC    Standing Status:   Standing    Number of Occurrences:   1    Order Specific Question:   # of Units    Answer:   1 unit    Order Specific Question:   Transfusion Indications    Answer:   Symptomatic Anemia    Order Specific Question:   Special Requirements    Answer:   Irradiated    Order Specific Question:   If emergent  release call blood bank    Answer:   Not emergent release  . Type and screen    Standing Status:   Future    Number of Occurrences:   1    Standing Expiration Date:   09/20/2018    INTERVAL HISTORY: Please see below for problem oriented charting. She returns for further follow-up She denies pain No further nausea or vomiting She was able to eat more since her last visit Her cough has improved Her blood sugar remains very high at home but she is not monitoring it closely [facility] service has made contact with the patient who declines social services or additional help  SUMMARY OF ONCOLOGIC HISTORY: Oncology History Overview Note  Biopsy is ER negative Her2/neu positive   Metastatic breast cancer ([place])  03/11/2018 Imaging   Ct scan of chest, abdomen and pelvis 1. 2.6 x 2.3 cm spiculated mass identified inferomedial quadrant of the left breast. Lesion appears to retract the overlying skin. Mammographic correlation recommended. 2. Small lymph nodes in the left axillar ill-defined and suspicious for metastatic disease. 3. Multiple bilateral pulmonary nodules measuring up to 2.7 cm diameter. These probably represent metastatic disease. Given the dominant size of the central right upper lobe lesion, synchronous lung primary can not be completely excluded. 4. Multiple ill-defined liver lesions compatible with metastatic disease. Dominant liver metastases measure up to almost 4 cm. 5.  Aortic Atherosclerois (ICD10-170.0)    03/12/2018 Pathology Results   Liver, needle/core  biopsy, left lobe - METASTATIC CARCINOMA. - LYMPHOVASCULAR INVASION IS IDENTIFIED. - SEE COMMENT. Microscopic Comment The tumor cells are positive for cytokeratin 7. There is faint staining for GATA-3. There is non-specific staining for TTF-1. Cytokeratin 20, CDX-2, estrogen receptor, and GCDFP stains are negative. Given the clinical suspicion, the profile supports a primary breast carcinoma. Her2 will be  performed and the results reported separately.  By immunohistochemistry, the tumor cells are POSITIVE for Her2 (3+).   03/12/2018 Imaging   Single 1.3 x 1.4 cm RIGHT occipital metastasis.  Borderline pachymeningeal enhancement of symmetric nature could be related to the superficial metastasis or osseous disease.   03/12/2018 Procedure   Ultrasound-guided core biopsy performed of a mass within the lateral segment of the left lobe of the liver.   03/16/2018 Imaging   Right occipital lobe 13 x 16 mm. Small satellite enhancing nodule deep to the larger lesion. The larger lesion shows central necrosis and probable mild hemorrhage or calcification.  Mild dural enhancement is less impressive and could be within normal limits.  Bone marrow in the clivus and cervical spine is diffusely low signal. No focal lesion. This may be related to the patient's anemia and abnormal blood count.   03/17/2018 Cancer Staging   Staging form: Breast, AJCC 8th Edition - Clinical: Stage IV (cT2, cN0, pM1, GX, ER-, PR: Not Assessed, HER2+) - Signed by [name], MD on 03/17/2018   03/19/2018 -  Chemotherapy   She received chemo with Taxotere, Herceptin and Perjeta   03/21/2018 Echocardiogram   IMPRESSIONS 1. The left ventricle has normal systolic function with an ejection fraction of 60-65%. The cavity size was normal. Left ventricular diastolic Doppler parameters are consistent with impaired relaxation.  2. The right ventricle has normal systolic function. The cavity was normal. There is no increase in right ventricular wall thickness.  3. The mitral valve is normal in structure.  4. The tricuspid valve is normal in structure.  5. Strain imaging performed but not reported due to interpreter judgement, secondary to suboptimal image quality.   03/24/2018 Procedure   Successful placement of a right IJ approach Power Port with ultrasound and fluoroscopic guidance. The catheter is ready for use.   04/07/2018 -  04/16/2018 Hospital Admission   She was admitted for pneumonia   04/07/2018 - 04/16/2018 Hospital Admission   She was admitted to the hospital for pneumonia and respiratory failure   04/25/2018 Imaging   Retropharyngeal fluid collection compatible with effusion or possibly abscess. Soft tissue thickening extends into the right neck and surrounds the right carotid bifurcation and right internal carotid artery. There is also streaky density in the right lateral neck soft tissues with a focal 7.6 Mm lymph node in the right lateral neck. Stranding surrounds the right submandibular gland.   Findings are most compatible with infection. Favor pharyngitis with retropharyngeal effusion/abscess. Soft tissue thickening around the right carotid most compatible with infection rather than tumor. Right jugular vein is patent.  Multiple nodular densities in the right upper lobe may represent residual pneumonia or metastatic disease. Interval improvement in right upper lobe infiltrate compared with CT of 04/08/2018   04/25/2018 - 05/06/2018 Hospital Admission   She was admitted for retropharyngeal abscess   04/27/2018 Imaging   No evidence of large central pulmonary embolus is noted in the main pulmonary artery or the main portions of the right and left pulmonary arteries. However, evaluation of the lower lobe branches, particularly on the right, is limited due to respiratory motion artifact  and other limiting issues. Pulmonary emboli and smaller peripheral branches can not be excluded on the basis of this exam.  Large left pleural effusion is noted with complete atelectasis of the left lower lobe. Increased left upper lobe opacity is noted posteriorly concerning for atelectasis or pneumonia.  Improved right upper lobe and lower lobe opacities are noted suggesting improving pneumonia or atelectasis.  15 mm right paratracheal lymph node is noted which is enlarged compared to prior exam; it is uncertain if this is  metastatic or inflammatory in etiology.  Hepatic metastatic lesions are again noted.  Aortic Atherosclerosis (ICD10-I70.0).    04/27/2018 Procedure   Successful ultrasound guided left thoracentesis yielding 120 mL of blood-tinged of pleural fluid.   06/11/2018 Imaging   1. No evidence of pulmonary emboli. 2. Extensive bilateral lower lobe consolidation. 3. Small pleural effusions. 4. 2.5 cm right upper lobe nodule, enlarged from 04/27/2018 and which may reflect a metastasis or primary lung cancer. Stable to mild enlargement of subcentimeter nodules in both upper lobes consistent with known metastases. 5. Decreased size of liver metastases. 6. Unchanged mild mediastinal and right hilar lymphadenopathy. 7. Aortic Atherosclerosis (ICD10-I70.0).   06/11/2018 - 06/22/2018 Hospital Admission   She was hospitalized for infection and SVT   07/18/2018 Imaging   MRI brain 1. Regression of solitary right occipital lobe metastasis and edema. No new brain metastasis. 2. New ill-defined enhancement at the atlantooccipital membrane, presumably a metastatic focus. The adjacent bone marrow is abnormally low signal but not enhancing to the degree of the extraosseous lesion. Marrow signal is likely from anemia.     Metastasis to brain ([place])  03/17/2018 Initial Diagnosis   Metastasis to brain [facility])   Metastasis to liver ([place])  03/17/2018 Initial Diagnosis   Metastasis to liver [facility])   Metastasis to bone ([place])  03/17/2018 Initial Diagnosis   Metastasis to bone ([facility])     REVIEW OF SYSTEMS:   Constitutional: Denies fevers, chills or abnormal weight loss Eyes: Denies blurriness of vision Ears, nose, mouth, throat, and face: Denies mucositis or sore throat Cardiovascular: Denies palpitation, chest discomfort or lower extremity swelling Skin: Denies abnormal skin rashes Lymphatics: Denies new lymphadenopathy or easy bruising Neurological:Denies numbness, tingling or new  weaknesses Behavioral/Psych: Mood is stable, no new changes  All other systems were reviewed with the patient and are negative.  I have reviewed the past medical history, past surgical history, social history and family history with the patient and they are unchanged from previous note.  ALLERGIES:  is allergic to nsaids.  MEDICATIONS:  Current Outpatient Medications  Medication Sig Dispense Refill  . ACCU-CHEK AVIVA PLUS test strip     . ACCU-CHEK SOFTCLIX LANCETS lancets     . acetaminophen (TYLENOL) 325 MG tablet Take 2 tablets (650 mg total) by mouth every 6 (six) hours as needed for mild pain (or Fever >/= 101). 30 tablet 0  . albuterol (PROAIR HFA) 108 (90 Base) MCG/ACT inhaler Inhale 2 puffs into the lungs every 6 (six) hours as needed for wheezing or shortness of breath. 1 Inhaler 1  . amiodarone (PACERONE) 200 MG tablet TAKE 1 TABLET BY MOUTH EVERY DAY 30 tablet 0  . benzonatate (TESSALON) 200 MG capsule Take 1 capsule (200 mg total) by mouth 3 (three) times daily as needed for cough (use second). 20 capsule 0  . blood glucose meter kit and supplies KIT Dispense based on patient and insurance preference. Use up to four times daily as directed. (FOR ICD-9 250.00, 250.01).  1 each 0  . esomeprazole (NEXIUM) 40 MG capsule Take 1 capsule (40 mg total) by mouth daily at 12 noon. 90 capsule 3  . feeding supplement, ENSURE ENLIVE, (ENSURE ENLIVE) LIQD Take 237 mLs by mouth 3 (three) times daily between meals. 237 mL 12  . fluticasone (FLOVENT HFA) 44 MCG/ACT inhaler Inhale 2 puffs into the lungs 2 (two) times daily. 3 Inhaler 1  . insulin aspart (NOVOLOG) 100 UNIT/ML FlexPen Inject 8 Units into the skin 3 (three) times daily with meals. (Patient taking differently: Inject 8 Units into the skin 3 (three) times daily with meals. Holds dose if not going to eat.) 15 mL 11  . Insulin Detemir (LEVEMIR) 100 UNIT/ML Pen Inject 10 Units into the skin daily. (Patient taking differently: Inject 10 Units  into the skin at bedtime. ) 15 mL 0  . Insulin Pen Needle 31G X 5 MM MISC 1 Device by Does not apply route QID. For use with insulin pens 100 each 0  . lidocaine-prilocaine (EMLA) cream Apply to affected area once (Patient taking differently: Apply 1 application topically as needed (port access). Apply to affected area once) 30 g 3  . LORazepam (ATIVAN) 0.5 MG tablet Take 1 tab po 30 minutes prior to radiation or MRI (Patient taking differently: Take 0.5 mg by mouth See admin instructions. Take 30 minutes prior to radiation or MRI) 30 tablet 0  . metFORMIN (GLUCOPHAGE) 500 MG tablet Take 500 mg by mouth daily with breakfast.     . metoprolol tartrate (LOPRESSOR) 25 MG tablet Take 0.5 tablets (12.5 mg total) by mouth 2 (two) times daily. 60 tablet 0  . mirtazapine (REMERON) 30 MG tablet TAKE 1 TABLET (30 MG TOTAL) BY MOUTH AT BEDTIME. 90 tablet 7  . montelukast (SINGULAIR) 10 MG tablet Take 1 tablet (10 mg total) by mouth daily. 90 tablet 1  . morphine (MSIR) 15 MG tablet Take 1 tablet (15 mg total) by mouth every 4 (four) hours as needed for severe pain. 60 tablet 0  . Multiple Vitamin (MULTIVITAMIN WITH MINERALS) TABS tablet Take 1 tablet by mouth daily.    . ondansetron (ZOFRAN) 8 MG tablet Take 1 tablet (8 mg total) by mouth every 8 (eight) hours as needed for nausea or vomiting. 60 tablet 1  . prochlorperazine (COMPAZINE) 10 MG tablet Take 10 mg by mouth every 6 (six) hours as needed for nausea or vomiting.      No current facility-administered medications for this visit.    Facility-Administered Medications Ordered in Other Visits  Medication Dose Route Frequency Provider Last Rate Last Dose  . heparin lock flush 100 unit/mL  250 Units Intracatheter PRN Lyn Joens, MD      . sodium chloride flush (NS) 0.9 % injection 3 mL  3 mL Intracatheter PRN Attila Mccarthy, MD        PHYSICAL EXAMINATION: ECOG PERFORMANCE STATUS: 3 - Symptomatic, >50% confined to bed  GENERAL:alert, no distress and  comfortable SKIN: skin color, texture, turgor are normal, no rashes or significant lesions.  She looks pale EYES: normal, Conjunctiva are pink and non-injected, sclera clear OROPHARYNX:no exudate, no erythema and lips, buccal mucosa, and tongue normal  NECK: supple, thyroid normal size, non-tender, without nodularity LYMPH:  no palpable lymphadenopathy in the cervical, axillary or inguinal LUNGS: clear to auscultation and percussion with normal breathing effort HEART: regular rate & rhythm and no murmurs and no lower extremity edema ABDOMEN:abdomen soft, non-tender and normal bowel sounds Musculoskeletal:no cyanosis of digits and  no clubbing  NEURO: alert & oriented x 3 with fluent speech, no focal motor/sensory deficits  LABORATORY DATA:  I have reviewed the data as listed    Component Value Date/Time   NA 136 08/20/2018 0835   K 4.5 08/20/2018 0835   CL 101 08/20/2018 0835   CO2 25 08/20/2018 0835   GLUCOSE 312 (H) 08/20/2018 0835   BUN 19 08/20/2018 0835   CREATININE 0.68 08/20/2018 0835   CALCIUM 8.4 (L) 08/20/2018 0835   PROT 6.1 (L) 08/20/2018 0835   ALBUMIN 1.8 (L) 08/20/2018 0835   AST 19 08/20/2018 0835   ALT 16 08/20/2018 0835   ALKPHOS 472 (H) 08/20/2018 0835   BILITOT 1.2 08/20/2018 0835   GFRNONAA >60 08/20/2018 0835   GFRAA >60 08/20/2018 0835    No results found for: SPEP, UPEP  Lab Results  Component Value Date   WBC 12.1 (H) 08/20/2018   NEUTROABS 7.5 08/20/2018   HGB 7.6 (L) 08/20/2018   HCT 24.5 (L) 08/20/2018   MCV 87.2 08/20/2018   PLT 18 (L) 08/20/2018      Chemistry      Component Value Date/Time   NA 136 08/20/2018 0835   K 4.5 08/20/2018 0835   CL 101 08/20/2018 0835   CO2 25 08/20/2018 0835   BUN 19 08/20/2018 0835   CREATININE 0.68 08/20/2018 0835      Component Value Date/Time   CALCIUM 8.4 (L) 08/20/2018 0835   ALKPHOS 472 (H) 08/20/2018 0835   AST 19 08/20/2018 0835   ALT 16 08/20/2018 0835   BILITOT 1.2 08/20/2018 0835       All questions were answered. The patient knows to call the clinic with any problems, questions or concerns. No barriers to learning was detected.  I spent 25 minutes counseling the patient face to face. The total time spent in the appointment was 30 minutes and more than 50% was on counseling and review of test results  [name], MD 08/21/2018 1:29 PM

## 2018-08-21 NOTE — Assessment & Plan Note (Signed)
She has significant complication from each cycle of treatment despite dose adjustment She is slowly recovering from side effects of therapy She is not ready to resume chemotherapy I will have further discussion with the patient next week about plan of care

## 2018-08-21 NOTE — Assessment & Plan Note (Signed)
She continues to have poorly controlled diabetes She will get insulin therapy here We discussed the importance of frequent monitoring of her blood sugar and adjustment of her insulin as needed

## 2018-08-21 NOTE — Telephone Encounter (Signed)
R.s appt per 7/23 sch message - pt aware of new time and date

## 2018-08-26 ENCOUNTER — Other Ambulatory Visit: Payer: Self-pay | Admitting: Radiation Therapy

## 2018-08-26 DIAGNOSIS — C7931 Secondary malignant neoplasm of brain: Secondary | ICD-10-CM

## 2018-08-28 ENCOUNTER — Telehealth: Payer: Self-pay | Admitting: *Deleted

## 2018-08-28 NOTE — Telephone Encounter (Signed)
Received notification patient's home health/mother called On Call service last evening. Patient continues to have a temp of 99.5. Patient has new bruising to her left lower calf, extreme weakness and pain. She has difficulty ambulating and increased pain with bearing weight on her left side. Patient and mother are very concerned and upset. Per message- patient refused to go to the ED as suggested by the on call dr.   Hulen Skains patient this morning. Spoke to mother and patient. She is still feeling the same. No new symptoms. Very weak, bruising is about the same and pain has not been relieved. Patient declines visit today with Dr. Alvy Bimler or any further interventions stating "I just want to rest today." Encouraged patient to be seen today instead of her scheduled appt tomorrow. Patient again declined.   Dr. Alvy Bimler made aware.

## 2018-08-29 ENCOUNTER — Inpatient Hospital Stay: Payer: BC Managed Care – PPO

## 2018-08-29 ENCOUNTER — Telehealth: Payer: Self-pay | Admitting: *Deleted

## 2018-08-29 ENCOUNTER — Inpatient Hospital Stay (HOSPITAL_BASED_OUTPATIENT_CLINIC_OR_DEPARTMENT_OTHER): Payer: BC Managed Care – PPO | Admitting: Hematology and Oncology

## 2018-08-29 ENCOUNTER — Other Ambulatory Visit: Payer: Self-pay | Admitting: *Deleted

## 2018-08-29 ENCOUNTER — Encounter: Payer: Self-pay | Admitting: Hematology and Oncology

## 2018-08-29 ENCOUNTER — Other Ambulatory Visit: Payer: Self-pay

## 2018-08-29 ENCOUNTER — Other Ambulatory Visit: Payer: Self-pay | Admitting: Hematology and Oncology

## 2018-08-29 VITALS — BP 92/45 | HR 87 | Temp 97.9°F | Resp 18

## 2018-08-29 DIAGNOSIS — D61810 Antineoplastic chemotherapy induced pancytopenia: Secondary | ICD-10-CM

## 2018-08-29 DIAGNOSIS — C7951 Secondary malignant neoplasm of bone: Secondary | ICD-10-CM

## 2018-08-29 DIAGNOSIS — Z794 Long term (current) use of insulin: Secondary | ICD-10-CM

## 2018-08-29 DIAGNOSIS — R5381 Other malaise: Secondary | ICD-10-CM

## 2018-08-29 DIAGNOSIS — C7931 Secondary malignant neoplasm of brain: Secondary | ICD-10-CM | POA: Diagnosis not present

## 2018-08-29 DIAGNOSIS — C50912 Malignant neoplasm of unspecified site of left female breast: Secondary | ICD-10-CM

## 2018-08-29 DIAGNOSIS — C7800 Secondary malignant neoplasm of unspecified lung: Secondary | ICD-10-CM

## 2018-08-29 DIAGNOSIS — D61818 Other pancytopenia: Secondary | ICD-10-CM

## 2018-08-29 DIAGNOSIS — Z171 Estrogen receptor negative status [ER-]: Secondary | ICD-10-CM

## 2018-08-29 DIAGNOSIS — E440 Moderate protein-calorie malnutrition: Secondary | ICD-10-CM

## 2018-08-29 DIAGNOSIS — C50919 Malignant neoplasm of unspecified site of unspecified female breast: Secondary | ICD-10-CM

## 2018-08-29 DIAGNOSIS — E43 Unspecified severe protein-calorie malnutrition: Secondary | ICD-10-CM

## 2018-08-29 DIAGNOSIS — C787 Secondary malignant neoplasm of liver and intrahepatic bile duct: Secondary | ICD-10-CM

## 2018-08-29 DIAGNOSIS — E1165 Type 2 diabetes mellitus with hyperglycemia: Secondary | ICD-10-CM

## 2018-08-29 DIAGNOSIS — Z7189 Other specified counseling: Secondary | ICD-10-CM

## 2018-08-29 DIAGNOSIS — E860 Dehydration: Secondary | ICD-10-CM

## 2018-08-29 DIAGNOSIS — Z7951 Long term (current) use of inhaled steroids: Secondary | ICD-10-CM

## 2018-08-29 DIAGNOSIS — Z79899 Other long term (current) drug therapy: Secondary | ICD-10-CM

## 2018-08-29 LAB — CMP (CANCER CENTER ONLY)
ALT: 6 U/L (ref 0–44)
AST: 7 U/L — ABNORMAL LOW (ref 15–41)
Albumin: 1.5 g/dL — ABNORMAL LOW (ref 3.5–5.0)
Alkaline Phosphatase: 156 U/L — ABNORMAL HIGH (ref 38–126)
Anion gap: 10 (ref 5–15)
BUN: 14 mg/dL (ref 6–20)
CO2: 31 mmol/L (ref 22–32)
Calcium: 8.4 mg/dL — ABNORMAL LOW (ref 8.9–10.3)
Chloride: 95 mmol/L — ABNORMAL LOW (ref 98–111)
Creatinine: 0.63 mg/dL (ref 0.44–1.00)
GFR, Est AFR Am: >60 mL/min (ref >60)
GFR, Estimated: >60 mL/min (ref >60)
Glucose, Bld: 91 mg/dL (ref 70–99)
Potassium: 3.9 mmol/L (ref 3.5–5.1)
Sodium: 136 mmol/L (ref 135–145)
Total Bilirubin: 1.1 mg/dL (ref 0.3–1.2)
Total Protein: 6.1 g/dL — ABNORMAL LOW (ref 6.5–8.1)

## 2018-08-29 LAB — CBC WITH DIFFERENTIAL (CANCER CENTER ONLY)
Abs Immature Granulocytes: 1.31 10*3/uL — ABNORMAL HIGH (ref 0.00–0.07)
Basophils Absolute: 0.3 10*3/uL — ABNORMAL HIGH (ref 0.0–0.1)
Basophils Relative: 3 %
Eosinophils Absolute: 0 10*3/uL (ref 0.0–0.5)
Eosinophils Relative: 0 %
HCT: 22.2 % — ABNORMAL LOW (ref 36.0–46.0)
Hemoglobin: 7 g/dL — ABNORMAL LOW (ref 12.0–15.0)
Immature Granulocytes: 15 %
Lymphocytes Relative: 12 %
Lymphs Abs: 1 10*3/uL (ref 0.7–4.0)
MCH: 27.1 pg (ref 26.0–34.0)
MCHC: 31.5 g/dL (ref 30.0–36.0)
MCV: 86 fL (ref 80.0–100.0)
Monocytes Absolute: 0.4 10*3/uL (ref 0.1–1.0)
Monocytes Relative: 4 %
Neutro Abs: 5.8 10*3/uL (ref 1.7–7.7)
Neutrophils Relative %: 66 %
Platelet Count: 13 10*3/uL — ABNORMAL LOW (ref 150–400)
RBC: 2.58 MIL/uL — ABNORMAL LOW (ref 3.87–5.11)
RDW: 16.5 % — ABNORMAL HIGH (ref 11.5–15.5)
Smear Review: 1
WBC Count: 8.7 10*3/uL (ref 4.0–10.5)
nRBC: 0.2 % (ref 0.0–0.2)

## 2018-08-29 LAB — PREPARE RBC (CROSSMATCH)

## 2018-08-29 LAB — SAMPLE TO BLOOD BANK

## 2018-08-29 MED ORDER — SODIUM CHLORIDE 0.9% FLUSH
10.0000 mL | Freq: Once | INTRAVENOUS | Status: AC
  Administered 2018-08-29: 10 mL
  Filled 2018-08-29: qty 10

## 2018-08-29 MED ORDER — ACETAMINOPHEN 325 MG PO TABS
650.0000 mg | ORAL_TABLET | Freq: Once | ORAL | Status: AC
  Administered 2018-08-29: 650 mg via ORAL

## 2018-08-29 MED ORDER — SODIUM CHLORIDE 0.9% IV SOLUTION
250.0000 mL | Freq: Once | INTRAVENOUS | Status: AC
  Administered 2018-08-29: 250 mL via INTRAVENOUS
  Filled 2018-08-29: qty 250

## 2018-08-29 MED ORDER — DIPHENHYDRAMINE HCL 25 MG PO CAPS
25.0000 mg | ORAL_CAPSULE | Freq: Once | ORAL | Status: AC
  Administered 2018-08-29: 25 mg via ORAL

## 2018-08-29 MED ORDER — HYDROMORPHONE HCL 2 MG/ML IJ SOLN
INTRAMUSCULAR | Status: AC
  Filled 2018-08-29: qty 1

## 2018-08-29 MED ORDER — HEPARIN SOD (PORK) LOCK FLUSH 100 UNIT/ML IV SOLN
500.0000 [IU] | Freq: Once | INTRAVENOUS | Status: AC
  Administered 2018-08-29: 500 [IU] via INTRAVENOUS
  Filled 2018-08-29: qty 5

## 2018-08-29 MED ORDER — DIPHENHYDRAMINE HCL 25 MG PO CAPS
ORAL_CAPSULE | ORAL | Status: AC
  Filled 2018-08-29: qty 1

## 2018-08-29 MED ORDER — SODIUM CHLORIDE 0.9% FLUSH
10.0000 mL | Freq: Once | INTRAVENOUS | Status: AC
  Administered 2018-08-29: 10 mL via INTRAVENOUS
  Filled 2018-08-29: qty 10

## 2018-08-29 MED ORDER — HYDROMORPHONE HCL 2 MG/ML IJ SOLN
2.0000 mg | INTRAMUSCULAR | Status: DC | PRN
  Administered 2018-08-29: 2 mg via INTRAVENOUS

## 2018-08-29 MED ORDER — ACETAMINOPHEN 325 MG PO TABS
ORAL_TABLET | ORAL | Status: AC
  Filled 2018-08-29: qty 2

## 2018-08-29 NOTE — Telephone Encounter (Signed)
Received a phone call from patient's sister, Stacy Shelton call back number [phone]. She is very concerned about her sister. The mother called her and told her about the bruising [name] had and advised her the doctors did not seem to care and did not call her back. Guy Franco the this RN spoke to both her mother and her sister about the bruising and pain. Ady declined an office visit for that same day numerous times. Stacy Shelton reports there has been a tremendous amount of miscommunication between her family members. She inquired about the CT scan [name] was suppose to have following her chemo treatment in June. Stacy Shelton is requesting a call from Dr. Alvy Bimler after [name]'s appt today. Stacy Shelton is trying to figure out if it is time for her to return to [place] to care for [name] and giver her mom a break.

## 2018-08-29 NOTE — Assessment & Plan Note (Signed)
She has significant complication from each cycle of treatment despite dose adjustment She is slowly recovering from side effects of therapy She is not ready to resume chemotherapy Continue aggressive supportive care now

## 2018-08-29 NOTE — Assessment & Plan Note (Signed)
I have numerous goals of care discussion between the patient, her family and other caregivers The patient is not happy that her family does not communicate well with her and accused the patient of not telling the truth She does not want his sister from [place] to come to take care of her With her permission, I have reviewed her case with the patient's friend, Stacy Shelton Her best friend, Stacy Shelton confirmed the patient stability at home and would like to help her to the best of her ability With her permission, I have another 30 minutes conversation with her sister, Stacy Shelton, family members have been noted some poor compliance with recommendation and treatment At present time, the patient appears to be alert and oriented and able to make sound medical decision The patient has refused some treatment or therapy in the past and would only work with certain people and limitations within the system She does not want to be admitted to the hospital or the emergency room under any circumstances She would like to continue to come here to be evaluated, transfuse and manage on a weekly basis I will try to accommodate to her wishes within the confines of my ability

## 2018-08-29 NOTE — Progress Notes (Signed)
[place] OFFICE PROGRESS NOTE  Patient Care Team: Robyne Peers, MD as PCP - General (Family Medicine) Elouise Munroe, MD as PCP - Cardiology (Cardiology)  ASSESSMENT & PLAN:  Metastatic breast cancer [facility]) She has significant complication from each cycle of treatment despite dose adjustment She is slowly recovering from side effects of therapy She is not ready to resume chemotherapy Continue aggressive supportive care now  Malnutrition of moderate degree Her generalized edema is due to third spacing, secondary to poor oral intake Encouraged patient to increase oral intake as tolerated She has appetite stimulant to take as needed  Pancytopenia, acquired ([place]) She has severe pancytopenia I plan to give her 2 units of blood and 1 unit of platelet given recent bleeding We will recheck a blood count again next week  Physical debility She is profoundly debilitated and spent majority of her time lying in bed She has not done physical therapy for 2 weeks I will try to get advanced home care to resume physical therapy as tolerated 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 and she has developed further decubitus ulcer due to poor mobility  Goals of care, counseling/discussion I have numerous goals of care discussion between the patient, her family and other caregivers The patient is not happy that her family does not communicate well with her and accused the patient of not telling the truth She does not want his sister from [place] to come to take care of her With her permission, I have reviewed her case with the patient's friend, Judson Roch Her best friend, Judson Roch confirmed the patient stability at home and would like to help her to the best of her ability With her permission, I have another 30 minutes conversation with her sister, Almeta Monas, family members have been noted some poor compliance with recommendation and treatment At present time, the patient  appears to be alert and oriented and able to make sound medical decision The patient has refused some treatment or therapy in the past and would only work with certain people and limitations within the system She does not want to be admitted to the hospital or the emergency room under any circumstances She would like to continue to come here to be evaluated, transfuse and manage on a weekly basis I will try to accommodate to her wishes within the confines of my ability   Orders Placed This Encounter  Procedures  . Prepare Pheresed Platelets    Standing Status:   Standing    Number of Occurrences:   1    Order Specific Question:   Number of Apheresis Units    Answer:   1 unit (6-10 packs)    Order Specific Question:   Transfusion Indications    Answer:   Platelet Dysfunction    Order Specific Question:   Special Requirements    Answer:   Irradiated  . Prepare RBC    Standing Status:   Standing    Number of Occurrences:   1    Order Specific Question:   # of Units    Answer:   2 units    Order Specific Question:   Transfusion Indications    Answer:   Symptomatic Anemia    Order Specific Question:   Special Requirements    Answer:   Irradiated    Order Specific Question:   If emergent release call blood bank    Answer:   Not emergent release    INTERVAL HISTORY: Please see below for  problem oriented charting. The patient is seen in the infusion room I have received numerous phone calls from her sister, her mother and advanced home care service related to the patient's state of health at home Several days ago, she developed severe pain at the back of her left knee/cough She developed bruising and generalized edema She has not been compliant with taking her medications on a regular basis Despite having pain, she disliked taking morphine because it caused sedation She had some minor nosebleed when I entered the room but denies bleeding at home The patient is almost completely  bedbound over the last 2 weeks She has not participated with home physical therapy because she only like to work with one therapist and not the other and because of her profound weakness, she was not able to do anything She denies cough, chest pain or shortness of breath The patient is not happy because she felt that her family does not trust her She has verbalized disappointment with communication at home and would not like her sister to come and visit her from [place]: Oncology History Overview Note  Biopsy is ER negative Her2/neu positive   Metastatic breast cancer ([place])  03/11/2018 Imaging   Ct scan of chest, abdomen and pelvis 1. 2.6 x 2.3 cm spiculated mass identified inferomedial quadrant of the left breast. Lesion appears to retract the overlying skin. Mammographic correlation recommended. 2. Small lymph nodes in the left axillar ill-defined and suspicious for metastatic disease. 3. Multiple bilateral pulmonary nodules measuring up to 2.7 cm diameter. These probably represent metastatic disease. Given the dominant size of the central right upper lobe lesion, synchronous lung primary can not be completely excluded. 4. Multiple ill-defined liver lesions compatible with metastatic disease. Dominant liver metastases measure up to almost 4 cm. 5.  Aortic Atherosclerois (ICD10-170.0)    03/12/2018 Pathology Results   Liver, needle/core biopsy, left lobe - METASTATIC CARCINOMA. - LYMPHOVASCULAR INVASION IS IDENTIFIED. - SEE COMMENT. Microscopic Comment The tumor cells are positive for cytokeratin 7. There is faint staining for GATA-3. There is non-specific staining for TTF-1. Cytokeratin 20, CDX-2, estrogen receptor, and GCDFP stains are negative. Given the clinical suspicion, the profile supports a primary breast carcinoma. Her2 will be performed and the results reported separately.  By immunohistochemistry, the tumor cells are POSITIVE for Her2 (3+).    03/12/2018 Imaging   Single 1.3 x 1.4 cm RIGHT occipital metastasis.  Borderline pachymeningeal enhancement of symmetric nature could be related to the superficial metastasis or osseous disease.   03/12/2018 Procedure   Ultrasound-guided core biopsy performed of a mass within the lateral segment of the left lobe of the liver.   03/16/2018 Imaging   Right occipital lobe 13 x 16 mm. Small satellite enhancing nodule deep to the larger lesion. The larger lesion shows central necrosis and probable mild hemorrhage or calcification.  Mild dural enhancement is less impressive and could be within normal limits.  Bone marrow in the clivus and cervical spine is diffusely low signal. No focal lesion. This may be related to the patient's anemia and abnormal blood count.   03/17/2018 Cancer Staging   Staging form: Breast, AJCC 8th Edition - Clinical: Stage IV (cT2, cN0, pM1, GX, ER-, PR: Not Assessed, HER2+) - Signed by [name], MD on 03/17/2018   03/19/2018 -  Chemotherapy   She received chemo with Taxotere, Herceptin and Perjeta   03/21/2018 Echocardiogram   IMPRESSIONS 1. The left ventricle has normal systolic function with  an ejection fraction of 60-65%. The cavity size was normal. Left ventricular diastolic Doppler parameters are consistent with impaired relaxation.  2. The right ventricle has normal systolic function. The cavity was normal. There is no increase in right ventricular wall thickness.  3. The mitral valve is normal in structure.  4. The tricuspid valve is normal in structure.  5. Strain imaging performed but not reported due to interpreter judgement, secondary to suboptimal image quality.   03/24/2018 Procedure   Successful placement of a right IJ approach Power Port with ultrasound and fluoroscopic guidance. The catheter is ready for use.   04/07/2018 - 04/16/2018 Hospital Admission   She was admitted for pneumonia   04/07/2018 - 04/16/2018 Hospital Admission   She was admitted  to the hospital for pneumonia and respiratory failure   04/25/2018 Imaging   Retropharyngeal fluid collection compatible with effusion or possibly abscess. Soft tissue thickening extends into the right neck and surrounds the right carotid bifurcation and right internal carotid artery. There is also streaky density in the right lateral neck soft tissues with a focal 7.6 Mm lymph node in the right lateral neck. Stranding surrounds the right submandibular gland.   Findings are most compatible with infection. Favor pharyngitis with retropharyngeal effusion/abscess. Soft tissue thickening around the right carotid most compatible with infection rather than tumor. Right jugular vein is patent.  Multiple nodular densities in the right upper lobe may represent residual pneumonia or metastatic disease. Interval improvement in right upper lobe infiltrate compared with CT of 04/08/2018   04/25/2018 - 05/06/2018 Hospital Admission   She was admitted for retropharyngeal abscess   04/27/2018 Imaging   No evidence of large central pulmonary embolus is noted in the main pulmonary artery or the main portions of the right and left pulmonary arteries. However, evaluation of the lower lobe branches, particularly on the right, is limited due to respiratory motion artifact and other limiting issues. Pulmonary emboli and smaller peripheral branches can not be excluded on the basis of this exam.  Large left pleural effusion is noted with complete atelectasis of the left lower lobe. Increased left upper lobe opacity is noted posteriorly concerning for atelectasis or pneumonia.  Improved right upper lobe and lower lobe opacities are noted suggesting improving pneumonia or atelectasis.  15 mm right paratracheal lymph node is noted which is enlarged compared to prior exam; it is uncertain if this is metastatic or inflammatory in etiology.  Hepatic metastatic lesions are again noted.  Aortic Atherosclerosis  (ICD10-I70.0).    04/27/2018 Procedure   Successful ultrasound guided left thoracentesis yielding 120 mL of blood-tinged of pleural fluid.   06/11/2018 Imaging   1. No evidence of pulmonary emboli. 2. Extensive bilateral lower lobe consolidation. 3. Small pleural effusions. 4. 2.5 cm right upper lobe nodule, enlarged from 04/27/2018 and which may reflect a metastasis or primary lung cancer. Stable to mild enlargement of subcentimeter nodules in both upper lobes consistent with known metastases. 5. Decreased size of liver metastases. 6. Unchanged mild mediastinal and right hilar lymphadenopathy. 7. Aortic Atherosclerosis (ICD10-I70.0).   06/11/2018 - 06/22/2018 Hospital Admission   She was hospitalized for infection and SVT   07/18/2018 Imaging   MRI brain 1. Regression of solitary right occipital lobe metastasis and edema. No new brain metastasis. 2. New ill-defined enhancement at the atlantooccipital membrane, presumably a metastatic focus. The adjacent bone marrow is abnormally low signal but not enhancing to the degree of the extraosseous lesion. Marrow signal is likely from anemia.  Metastasis to brain ([place])  03/17/2018 Initial Diagnosis   Metastasis to brain [facility])   Metastasis to liver ([place])  03/17/2018 Initial Diagnosis   Metastasis to liver [facility])   Metastasis to bone ([place])  03/17/2018 Initial Diagnosis   Metastasis to bone ([facility])     REVIEW OF SYSTEMS:   Constitutional: Denies fevers, chills  Eyes: Denies blurriness of vision Ears, nose, mouth, throat, and face: Denies mucositis or sore throat Respiratory: Denies cough, dyspnea or wheezes Cardiovascular: Denies palpitation, chest discomfort or lower extremity swelling Gastrointestinal:  Denies nausea, heartburn or change in bowel habits Lymphatics: Denies new lymphadenopathy or easy bruising Behavioral/Psych: Mood is stable, no new changes  All other systems were reviewed with the patient and are negative.  I have  reviewed the past medical history, past surgical history, social history and family history with the patient and they are unchanged from previous note.  ALLERGIES:  is allergic to nsaids.  MEDICATIONS:  Current Outpatient Medications  Medication Sig Dispense Refill  . ACCU-CHEK AVIVA PLUS test strip     . ACCU-CHEK SOFTCLIX LANCETS lancets     . acetaminophen (TYLENOL) 325 MG tablet Take 2 tablets (650 mg total) by mouth every 6 (six) hours as needed for mild pain (or Fever >/= 101). 30 tablet 0  . albuterol (PROAIR HFA) 108 (90 Base) MCG/ACT inhaler Inhale 2 puffs into the lungs every 6 (six) hours as needed for wheezing or shortness of breath. 1 Inhaler 1  . amiodarone (PACERONE) 200 MG tablet TAKE 1 TABLET BY MOUTH EVERY DAY 30 tablet 0  . benzonatate (TESSALON) 200 MG capsule Take 1 capsule (200 mg total) by mouth 3 (three) times daily as needed for cough (use second). 20 capsule 0  . blood glucose meter kit and supplies KIT Dispense based on patient and insurance preference. Use up to four times daily as directed. (FOR ICD-9 250.00, 250.01). 1 each 0  . esomeprazole (NEXIUM) 40 MG capsule Take 1 capsule (40 mg total) by mouth daily at 12 noon. 90 capsule 3  . feeding supplement, ENSURE ENLIVE, (ENSURE ENLIVE) LIQD Take 237 mLs by mouth 3 (three) times daily between meals. 237 mL 12  . fluticasone (FLOVENT HFA) 44 MCG/ACT inhaler Inhale 2 puffs into the lungs 2 (two) times daily. 3 Inhaler 1  . insulin aspart (NOVOLOG) 100 UNIT/ML FlexPen Inject 8 Units into the skin 3 (three) times daily with meals. (Patient taking differently: Inject 8 Units into the skin 3 (three) times daily with meals. Holds dose if not going to eat.) 15 mL 11  . Insulin Detemir (LEVEMIR) 100 UNIT/ML Pen Inject 10 Units into the skin daily. (Patient taking differently: Inject 10 Units into the skin at bedtime. ) 15 mL 0  . Insulin Pen Needle 31G X 5 MM MISC 1 Device by Does not apply route QID. For use with insulin pens  100 each 0  . lidocaine-prilocaine (EMLA) cream Apply to affected area once (Patient taking differently: Apply 1 application topically as needed (port access). Apply to affected area once) 30 g 3  . LORazepam (ATIVAN) 0.5 MG tablet Take 1 tab po 30 minutes prior to radiation or MRI (Patient taking differently: Take 0.5 mg by mouth See admin instructions. Take 30 minutes prior to radiation or MRI) 30 tablet 0  . metFORMIN (GLUCOPHAGE) 500 MG tablet Take 500 mg by mouth daily with breakfast.     . metoprolol tartrate (LOPRESSOR) 25 MG tablet Take 0.5 tablets (12.5 mg total) by mouth  2 (two) times daily. 60 tablet 0  . mirtazapine (REMERON) 30 MG tablet TAKE 1 TABLET (30 MG TOTAL) BY MOUTH AT BEDTIME. 90 tablet 7  . montelukast (SINGULAIR) 10 MG tablet Take 1 tablet (10 mg total) by mouth daily. 90 tablet 1  . morphine (MSIR) 15 MG tablet Take 1 tablet (15 mg total) by mouth every 4 (four) hours as needed for severe pain. 60 tablet 0  . Multiple Vitamin (MULTIVITAMIN WITH MINERALS) TABS tablet Take 1 tablet by mouth daily.    . ondansetron (ZOFRAN) 8 MG tablet Take 1 tablet (8 mg total) by mouth every 8 (eight) hours as needed for nausea or vomiting. 60 tablet 1  . prochlorperazine (COMPAZINE) 10 MG tablet Take 10 mg by mouth every 6 (six) hours as needed for nausea or vomiting.      No current facility-administered medications for this visit.    Facility-Administered Medications Ordered in Other Visits  Medication Dose Route Frequency Provider Last Rate Last Dose  . heparin lock flush 100 unit/mL  250 Units Intracatheter PRN Alvy Bimler, Neiva Maenza, MD      . HYDROmorphone (DILAUDID) injection 2 mg  2 mg Intravenous Q3H PRN Alvy Bimler, Ardelle Haliburton, MD   2 mg at 08/29/18 1044  . sodium chloride flush (NS) 0.9 % injection 3 mL  3 mL Intracatheter PRN Alvy Bimler, Rafi Kenneth, MD        PHYSICAL EXAMINATION: ECOG PERFORMANCE STATUS: 4 - Bedbound  GENERAL:alert, no distress and comfortable.  She had minor nosebleed that is  self-limiting SKIN: She is noted to have bruising and new decubitus ulcer at the back of the left cough EYES: normal, Conjunctiva are pale and non-injected, sclera clear OROPHARYNX:no exudate, no erythema and lips, buccal mucosa, and tongue normal  NECK: supple, thyroid normal size, non-tender, without nodularity LYMPH:  no palpable lymphadenopathy in the cervical, axillary or inguinal LUNGS: clear to auscultation and percussion with normal breathing effort HEART: regular rate & rhythm and no murmurs and generalized bilateral extremity edema ABDOMEN:abdomen soft, non-tender and normal bowel sounds Musculoskeletal:no cyanosis of digits and no clubbing  NEURO: alert & oriented x 3 with fluent speech, no focal motor/sensory deficits.  She is noted to be profoundly weak.  She is also noted to have bilateral foot drop  LABORATORY DATA:  I have reviewed the data as listed    Component Value Date/Time   NA 136 08/29/2018 0840   K 3.9 08/29/2018 0840   CL 95 (L) 08/29/2018 0840   CO2 31 08/29/2018 0840   GLUCOSE 91 08/29/2018 0840   BUN 14 08/29/2018 0840   CREATININE 0.63 08/29/2018 0840   CALCIUM 8.4 (L) 08/29/2018 0840   PROT 6.1 (L) 08/29/2018 0840   ALBUMIN 1.5 (L) 08/29/2018 0840   AST 7 (L) 08/29/2018 0840   ALT 6 08/29/2018 0840   ALKPHOS 156 (H) 08/29/2018 0840   BILITOT 1.1 08/29/2018 0840   GFRNONAA >60 08/29/2018 0840   GFRAA >60 08/29/2018 0840    No results found for: SPEP, UPEP  Lab Results  Component Value Date   WBC 8.7 08/29/2018   NEUTROABS 5.8 08/29/2018   HGB 7.0 (L) 08/29/2018   HCT 22.2 (L) 08/29/2018   MCV 86.0 08/29/2018   PLT 13 (L) 08/29/2018      Chemistry      Component Value Date/Time   NA 136 08/29/2018 0840   K 3.9 08/29/2018 0840   CL 95 (L) 08/29/2018 0840   CO2 31 08/29/2018 0840   BUN 14  08/29/2018 0840   CREATININE 0.63 08/29/2018 0840      Component Value Date/Time   CALCIUM 8.4 (L) 08/29/2018 0840   ALKPHOS 156 (H) 08/29/2018  0840   AST 7 (L) 08/29/2018 0840   ALT 6 08/29/2018 0840   BILITOT 1.1 08/29/2018 0840      All questions were answered. The patient knows to call the clinic with any problems, questions or concerns. No barriers to learning was detected.  I spent 60 minutes counseling the patient face to face and discussion with family. The total time spent in the appointment was 80 minutes and more than 50% was on counseling and review of test results  [name], MD 08/29/2018 2:46 PM

## 2018-08-29 NOTE — Patient Instructions (Signed)
Blood Transfusion, Adult, Care After This sheet gives you information about how to care for yourself after your procedure. Your doctor may also give you more specific instructions. If you have problems or questions, contact your doctor. Follow these instructions at home:   Take over-the-counter and prescription medicines only as told by your doctor.  Go back to your normal activities as told by your doctor.  Follow instructions from your doctor about how to take care of the area where an IV tube was put into your vein (insertion site). Make sure you: ? Wash your hands with soap and water before you change your bandage (dressing). If there is no soap and water, use hand sanitizer. ? Change your bandage as told by your doctor.  Check your IV insertion site every day for signs of infection. Check for: ? More redness, swelling, or pain. ? More fluid or blood. ? Warmth. ? Pus or a bad smell. Contact a doctor if:  You have more redness, swelling, or pain around the IV insertion site.  You have more fluid or blood coming from the IV insertion site.  Your IV insertion site feels warm to the touch.  You have pus or a bad smell coming from the IV insertion site.  Your pee (urine) turns pink, red, or brown.  You feel weak after doing your normal activities. Get help right away if:  You have signs of a serious allergic or body defense (immune) system reaction, including: ? Itchiness. ? Hives. ? Trouble breathing. ? Anxiety. ? Pain in your chest or lower back. ? Fever, flushing, and chills. ? Fast pulse. ? Rash. ? Watery poop (diarrhea). ? Throwing up (vomiting). ? Dark pee. ? Serious headache. ? Dizziness. ? Stiff neck. ? Yellow color in your face or the white parts of your eyes (jaundice). Summary  After a blood transfusion, return to your normal activities as told by your doctor.  Every day, check for signs of infection where the IV tube was put into your vein.  Some  signs of infection are warm skin, more redness and pain, more fluid or blood, and pus or a bad smell where the needle went in.  Contact your doctor if you feel weak or have any unusual symptoms. This information is not intended to replace advice given to you by your health care provider. Make sure you discuss any questions you have with your health care provider. Document Released: 02/05/2014 Document Revised: 05/22/2017 Document Reviewed: 09/09/2015 Elsevier Patient Education  [employer].    Platelet Transfusion A platelet transfusion is a procedure in which you receive donated platelets through an IV. Platelets are tiny pieces of blood cells. When you get an injury, platelets clump together in the area to form a blood clot. This helps stop bleeding and is the beginning of the healing process. If you have too few platelets, your blood may have trouble clotting. This may cause you to bleed and bruise very easily. You may need a platelet transfusion if you have a condition that causes a low number of platelets (thrombocytopenia). A platelet transfusion may be used to stop or prevent excessive bleeding. Tell a health care provider about:  Any reactions you have had during previous transfusions.  Any allergies you have.  All medicines you are taking, including vitamins, herbs, eye drops, creams, and over-the-counter medicines.  Any blood disorders you have.  Any surgeries you have had.  Any medical conditions you have.  Whether you are pregnant or may  be pregnant. What are the risks? Generally, this is a safe procedure. However, problems may occur, including:  Fever.  Infection.  Allergic reaction to the donor platelets.  Your body's disease-fighting system (immune system) attacking the donor platelets (hemolytic reaction). This is rare.  A rare reaction that causes lung damage (transfusion-related acute lung injury). What happens before the procedure? Medicines  Ask  your health care provider about: ? Changing or stopping your regular medicines. This is especially important if you are taking diabetes medicines or blood thinners. ? Taking medicines such as aspirin and ibuprofen. These medicines can thin your blood. Do not take these medicines unless your health care provider tells you to take them. ? Taking over-the-counter medicines, vitamins, herbs, and supplements. General instructions  You will have a blood test to determine your blood type. Your blood type determines what kind of platelets you will be given.  Follow instructions from your health care provider about eating or drinking restrictions.  If you have had an allergic reaction to a transfusion in the past, you may be given medicine to help prevent a reaction.  Your temperature, blood pressure, pulse, and breathing will be monitored. What happens during the procedure?   An IV will be inserted into one of your veins.  For your safety, two health care providers will verify your identity along with the donor platelets about to be infused.  A bag of donor platelets will be connected to your IV. The platelets will flow into your bloodstream. This usually takes 30-60 minutes.  Your temperature, blood pressure, pulse, and breathing will be monitored during the transfusion. This helps detect early signs of any reaction.  You will also be monitored for other symptoms that may indicate a reaction, including chills, hives, or itching.  If you have signs of a reaction at any time, your transfusion will be stopped, and you may be given medicine to help manage the reaction.  When your transfusion is complete, your IV will be removed.  Pressure may be applied to the IV site for a few minutes to stop any bleeding.  The IV site will be covered with a bandage (dressing). The procedure may vary among health care providers and hospitals. What happens after the procedure?  Your blood pressure,  temperature, pulse, and breathing will be monitored until you leave the hospital or clinic.  You may have some bruising and soreness at your IV site. Follow these instructions at home: Medicines  Take over-the-counter and prescription medicines only as told by your health care provider.  Talk with your health care provider before you take any medicines that contain aspirin or NSAIDs. These medicines increase your risk for dangerous bleeding. General instructions  Change or remove your dressing as told by your health care provider.  Return to your normal activities as told by your health care provider. Ask your health care provider what activities are safe for you.  Do not take baths, swim, or use a hot tub until your health care provider approves. Ask your health care provider if you may take showers.  Check your IV site every day for signs of infection. Check for: ? Redness, swelling, or pain. ? Fluid or blood. If fluid or blood drains from your IV site, use your hands to press down firmly on a bandage covering the area for a minute or two. Doing this should stop the bleeding. ? Warmth. ? Pus or a bad smell.  Keep all follow-up visits as told by your health  care provider. This is important. Contact a health care provider if you have:  A headache that does not go away with medicine.  Hives, rash, or itchy skin.  Nausea or vomiting.  Unusual tiredness or weakness.  Signs of infection at your IV site. Get help right away if:  You have a fever or chills.  You urinate less often than usual.  Your urine is darker colored than normal.  You have any of the following: ? Trouble breathing. ? Pain in your back, abdomen, or chest. ? Cool, clammy skin. ? A fast heartbeat. Summary  Platelets are tiny pieces of blood cells that clump together to form a blood clot when you have an injury. If you have too few platelets, your blood may have trouble clotting.  A platelet transfusion  is a procedure in which you receive donated platelets through an IV.  A platelet transfusion may be used to stop or prevent excessive bleeding.  After the procedure, check your IV site every day for signs of infection, including redness, swelling, pain, or warmth. This information is not intended to replace advice given to you by your health care provider. Make sure you discuss any questions you have with your health care provider. Document Released: 11/12/2006 Document Revised: 02/20/2017 Document Reviewed: 02/20/2017 Elsevier Patient Education  2020 [employer].

## 2018-08-29 NOTE — Assessment & Plan Note (Signed)
Her generalized edema is due to third spacing, secondary to poor oral intake Encouraged patient to increase oral intake as tolerated She has appetite stimulant to take as needed

## 2018-08-29 NOTE — Assessment & Plan Note (Signed)
She has severe pancytopenia I plan to give her 2 units of blood and 1 unit of platelet given recent bleeding We will recheck a blood count again next week

## 2018-08-29 NOTE — Assessment & Plan Note (Signed)
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

## 2018-08-31 LAB — TYPE AND SCREEN
ABO/RH(D): A POS
Antibody Screen: NEGATIVE
Unit division: 0
Unit division: 0

## 2018-08-31 LAB — PREPARE PLATELET PHERESIS: Unit division: 0

## 2018-08-31 LAB — BPAM RBC
Blood Product Expiration Date: 202008222359
Blood Product Expiration Date: 202008232359
ISSUE DATE / TIME: 202007311002
ISSUE DATE / TIME: 202007311002
Unit Type and Rh: 6200
Unit Type and Rh: 6200

## 2018-08-31 LAB — BPAM PLATELET PHERESIS
Blood Product Expiration Date: 202008022359
ISSUE DATE / TIME: 202007311033
Unit Type and Rh: 600

## 2018-09-01 ENCOUNTER — Inpatient Hospital Stay: Payer: BC Managed Care – PPO

## 2018-09-01 ENCOUNTER — Inpatient Hospital Stay: Payer: BC Managed Care – PPO | Attending: Hematology and Oncology

## 2018-09-01 ENCOUNTER — Telehealth: Payer: Self-pay

## 2018-09-01 ENCOUNTER — Other Ambulatory Visit: Payer: Self-pay | Admitting: Hematology and Oncology

## 2018-09-01 ENCOUNTER — Other Ambulatory Visit: Payer: Self-pay

## 2018-09-01 ENCOUNTER — Inpatient Hospital Stay (HOSPITAL_BASED_OUTPATIENT_CLINIC_OR_DEPARTMENT_OTHER): Payer: BC Managed Care – PPO | Admitting: Hematology and Oncology

## 2018-09-01 ENCOUNTER — Encounter: Payer: Self-pay | Admitting: Hematology and Oncology

## 2018-09-01 VITALS — BP 105/46 | HR 112 | Temp 98.4°F | Resp 16

## 2018-09-01 DIAGNOSIS — C7951 Secondary malignant neoplasm of bone: Secondary | ICD-10-CM

## 2018-09-01 DIAGNOSIS — C50919 Malignant neoplasm of unspecified site of unspecified female breast: Secondary | ICD-10-CM

## 2018-09-01 DIAGNOSIS — Z171 Estrogen receptor negative status [ER-]: Secondary | ICD-10-CM | POA: Diagnosis not present

## 2018-09-01 DIAGNOSIS — E440 Moderate protein-calorie malnutrition: Secondary | ICD-10-CM | POA: Insufficient documentation

## 2018-09-01 DIAGNOSIS — L8990 Pressure ulcer of unspecified site, unspecified stage: Secondary | ICD-10-CM | POA: Diagnosis not present

## 2018-09-01 DIAGNOSIS — Z794 Long term (current) use of insulin: Secondary | ICD-10-CM | POA: Insufficient documentation

## 2018-09-01 DIAGNOSIS — C50812 Malignant neoplasm of overlapping sites of left female breast: Secondary | ICD-10-CM | POA: Insufficient documentation

## 2018-09-01 DIAGNOSIS — R5381 Other malaise: Secondary | ICD-10-CM | POA: Diagnosis not present

## 2018-09-01 DIAGNOSIS — I700 Atherosclerosis of aorta: Secondary | ICD-10-CM | POA: Diagnosis not present

## 2018-09-01 DIAGNOSIS — D61818 Other pancytopenia: Secondary | ICD-10-CM | POA: Insufficient documentation

## 2018-09-01 DIAGNOSIS — L97429 Non-pressure chronic ulcer of left heel and midfoot with unspecified severity: Secondary | ICD-10-CM | POA: Diagnosis not present

## 2018-09-01 DIAGNOSIS — C7931 Secondary malignant neoplasm of brain: Secondary | ICD-10-CM

## 2018-09-01 DIAGNOSIS — L89892 Pressure ulcer of other site, stage 2: Secondary | ICD-10-CM | POA: Diagnosis not present

## 2018-09-01 DIAGNOSIS — Z79899 Other long term (current) drug therapy: Secondary | ICD-10-CM | POA: Diagnosis not present

## 2018-09-01 DIAGNOSIS — Z7189 Other specified counseling: Secondary | ICD-10-CM

## 2018-09-01 DIAGNOSIS — C787 Secondary malignant neoplasm of liver and intrahepatic bile duct: Secondary | ICD-10-CM

## 2018-09-01 DIAGNOSIS — Z886 Allergy status to analgesic agent status: Secondary | ICD-10-CM | POA: Diagnosis not present

## 2018-09-01 LAB — CMP (CANCER CENTER ONLY)
ALT: 9 U/L (ref 0–44)
AST: 11 U/L — ABNORMAL LOW (ref 15–41)
Albumin: 1.6 g/dL — ABNORMAL LOW (ref 3.5–5.0)
Alkaline Phosphatase: 182 U/L — ABNORMAL HIGH (ref 38–126)
Anion gap: 10 (ref 5–15)
BUN: 16 mg/dL (ref 6–20)
CO2: 29 mmol/L (ref 22–32)
Calcium: 8.5 mg/dL — ABNORMAL LOW (ref 8.9–10.3)
Chloride: 97 mmol/L — ABNORMAL LOW (ref 98–111)
Creatinine: 0.55 mg/dL (ref 0.44–1.00)
GFR, Est AFR Am: >60 mL/min (ref >60)
GFR, Estimated: >60 mL/min (ref >60)
Glucose, Bld: 178 mg/dL — ABNORMAL HIGH (ref 70–99)
Potassium: 4 mmol/L (ref 3.5–5.1)
Sodium: 136 mmol/L (ref 135–145)
Total Bilirubin: 1.7 mg/dL — ABNORMAL HIGH (ref 0.3–1.2)
Total Protein: 5.9 g/dL — ABNORMAL LOW (ref 6.5–8.1)

## 2018-09-01 LAB — CBC WITH DIFFERENTIAL (CANCER CENTER ONLY)
Abs Immature Granulocytes: 1.35 10*3/uL — ABNORMAL HIGH (ref 0.00–0.07)
Basophils Absolute: 0.3 10*3/uL — ABNORMAL HIGH (ref 0.0–0.1)
Basophils Relative: 3 %
Eosinophils Absolute: 0 10*3/uL (ref 0.0–0.5)
Eosinophils Relative: 0 %
HCT: 31.4 % — ABNORMAL LOW (ref 36.0–46.0)
Hemoglobin: 10 g/dL — ABNORMAL LOW (ref 12.0–15.0)
Immature Granulocytes: 13 %
Lymphocytes Relative: 10 %
Lymphs Abs: 1.1 10*3/uL (ref 0.7–4.0)
MCH: 28 pg (ref 26.0–34.0)
MCHC: 31.8 g/dL (ref 30.0–36.0)
MCV: 88 fL (ref 80.0–100.0)
Monocytes Absolute: 0.3 10*3/uL (ref 0.1–1.0)
Monocytes Relative: 3 %
Neutro Abs: 7.5 10*3/uL (ref 1.7–7.7)
Neutrophils Relative %: 71 %
Platelet Count: 15 10*3/uL — ABNORMAL LOW (ref 150–400)
RBC: 3.57 MIL/uL — ABNORMAL LOW (ref 3.87–5.11)
RDW: 15.7 % — ABNORMAL HIGH (ref 11.5–15.5)
WBC Count: 10.5 10*3/uL (ref 4.0–10.5)
nRBC: 0.2 % (ref 0.0–0.2)

## 2018-09-01 LAB — SAMPLE TO BLOOD BANK

## 2018-09-01 MED ORDER — DIPHENHYDRAMINE HCL 25 MG PO CAPS
25.0000 mg | ORAL_CAPSULE | Freq: Once | ORAL | Status: AC
  Administered 2018-09-01: 25 mg via ORAL

## 2018-09-01 MED ORDER — ACETAMINOPHEN 325 MG PO TABS
ORAL_TABLET | ORAL | Status: AC
  Filled 2018-09-01: qty 2

## 2018-09-01 MED ORDER — HYDROMORPHONE HCL 2 MG/ML IJ SOLN
2.0000 mg | INTRAMUSCULAR | Status: DC | PRN
  Administered 2018-09-01: 2 mg via INTRAVENOUS

## 2018-09-01 MED ORDER — HYDROMORPHONE HCL 2 MG/ML IJ SOLN
INTRAMUSCULAR | Status: AC
  Filled 2018-09-01: qty 1

## 2018-09-01 MED ORDER — DIPHENHYDRAMINE HCL 25 MG PO CAPS
ORAL_CAPSULE | ORAL | Status: AC
  Filled 2018-09-01: qty 1

## 2018-09-01 MED ORDER — HEPARIN SOD (PORK) LOCK FLUSH 100 UNIT/ML IV SOLN
500.0000 [IU] | Freq: Every day | INTRAVENOUS | Status: AC | PRN
  Administered 2018-09-01: 12:00:00 500 [IU]
  Filled 2018-09-01: qty 5

## 2018-09-01 MED ORDER — SODIUM CHLORIDE 0.9% FLUSH
10.0000 mL | INTRAVENOUS | Status: DC | PRN
  Filled 2018-09-01: qty 10

## 2018-09-01 MED ORDER — SODIUM CHLORIDE 0.9% FLUSH
10.0000 mL | Freq: Once | INTRAVENOUS | Status: AC
  Administered 2018-09-01: 10 mL
  Filled 2018-09-01: qty 10

## 2018-09-01 MED ORDER — ACETAMINOPHEN 325 MG PO TABS
650.0000 mg | ORAL_TABLET | Freq: Once | ORAL | Status: AC
  Administered 2018-09-01: 650 mg via ORAL

## 2018-09-01 MED ORDER — MORPHINE SULFATE 15 MG PO TABS: 15.0000 mg | ORAL_TABLET | ORAL | 0 refills | Status: AC | PRN

## 2018-09-01 MED ORDER — SODIUM CHLORIDE 0.9% IV SOLUTION
250.0000 mL | Freq: Once | INTRAVENOUS | Status: AC
  Administered 2018-09-01: 250 mL via INTRAVENOUS
  Filled 2018-09-01: qty 250

## 2018-09-01 NOTE — Assessment & Plan Note (Signed)
I had numerous goals of care discussion with the patient and family For now, we will continue aggressive supportive care as tolerated Her sister is currently in [place] to help out at home

## 2018-09-01 NOTE — Telephone Encounter (Signed)
Spoke with Queen Slough, RN for [facility] regarding [facility] orders for wound care and renewal of PT/OT

## 2018-09-01 NOTE — Assessment & Plan Note (Signed)
She has significant malnutrition We have extensive discussion about taking Remeron and nutritional supplement as tolerated

## 2018-09-01 NOTE — Assessment & Plan Note (Signed)
Her blood count has improved since transfusion last week Due to intermittent bleeding, I recommend platelet transfusion today She agreed to proceed I will see her again in the end of the week to see if she needs further transfusion support

## 2018-09-01 NOTE — Progress Notes (Addendum)
[facility] OFFICE PROGRESS NOTE  Patient Care Team: Robyne Peers, MD as PCP - General (Family Medicine) Elouise Munroe, MD as PCP - Cardiology (Cardiology)  ASSESSMENT & PLAN:  Metastatic breast cancer [facility]) She has significant complication from each cycle of treatment despite dose adjustment She is slowly recovering from side effects of therapy She is not ready to resume chemotherapy Continue aggressive supportive care now  Pancytopenia, acquired ([place]) Her blood count has improved since transfusion last week Due to intermittent bleeding, I recommend platelet transfusion today She agreed to proceed I will see her again in the end of the week to see if she needs further transfusion support  Decubitus ulcers She has significant pain secondary to multiple new decubitus ulcer I refill her prescription morphine I had extensive discussion with the patient and family members about the importance of different positioning, aggressive wound care and physical therapy  Malnutrition of moderate degree She has significant malnutrition We have extensive discussion about taking Remeron and nutritional supplement as tolerated  Physical debility She has not participated with physical therapy for over 3 weeks I recommend the patient and family to call physical therapy to resume therapy as tolerated  Goals of care, counseling/discussion I had numerous goals of care discussion with the patient and family For now, we will continue aggressive supportive care as tolerated Her sister is currently in [place] to help out at home   No orders of the defined types were placed in this encounter.   INTERVAL HISTORY: Please see below for problem oriented charting. She is seen in the infusion room.  With the patient's permission, I have also contacted her sister and spoke with both her sister and her mom over the telephone Since I saw her last weekend, she felt a bit  stronger with the transfusion support She ate a bit better However, she has developed multiple new decubitus ulcer She had ulcers on her upper back, sacrum, left calf, left heel and the inner right thigh According to her and her family, she usually lies on her reclining chair She does not participate in physical therapy She has minimum shortness of breath or cough No recent fever or chills Her appetite is stable No nausea or changes in bowel habits  SUMMARY OF ONCOLOGIC HISTORY: Oncology History Overview Note  Biopsy is ER negative Her2/neu positive   Metastatic breast cancer ([place])  03/11/2018 Imaging   Ct scan of chest, abdomen and pelvis 1. 2.6 x 2.3 cm spiculated mass identified inferomedial quadrant of the left breast. Lesion appears to retract the overlying skin. Mammographic correlation recommended. 2. Small lymph nodes in the left axillar ill-defined and suspicious for metastatic disease. 3. Multiple bilateral pulmonary nodules measuring up to 2.7 cm diameter. These probably represent metastatic disease. Given the dominant size of the central right upper lobe lesion, synchronous lung primary can not be completely excluded. 4. Multiple ill-defined liver lesions compatible with metastatic disease. Dominant liver metastases measure up to almost 4 cm. 5.  Aortic Atherosclerois (ICD10-170.0)    03/12/2018 Pathology Results   Liver, needle/core biopsy, left lobe - METASTATIC CARCINOMA. - LYMPHOVASCULAR INVASION IS IDENTIFIED. - SEE COMMENT. Microscopic Comment The tumor cells are positive for cytokeratin 7. There is faint staining for GATA-3. There is non-specific staining for TTF-1. Cytokeratin 20, CDX-2, estrogen receptor, and GCDFP stains are negative. Given the clinical suspicion, the profile supports a primary breast carcinoma. Her2 will be performed and the results reported separately.  By immunohistochemistry, the  tumor cells are POSITIVE for Her2 (3+).   03/12/2018 Imaging    Single 1.3 x 1.4 cm RIGHT occipital metastasis.  Borderline pachymeningeal enhancement of symmetric nature could be related to the superficial metastasis or osseous disease.   03/12/2018 Procedure   Ultrasound-guided core biopsy performed of a mass within the lateral segment of the left lobe of the liver.   03/16/2018 Imaging   Right occipital lobe 13 x 16 mm. Small satellite enhancing nodule deep to the larger lesion. The larger lesion shows central necrosis and probable mild hemorrhage or calcification.  Mild dural enhancement is less impressive and could be within normal limits.  Bone marrow in the clivus and cervical spine is diffusely low signal. No focal lesion. This may be related to the patient's anemia and abnormal blood count.   03/17/2018 Cancer Staging   Staging form: Breast, AJCC 8th Edition - Clinical: Stage IV (cT2, cN0, pM1, GX, ER-, PR: Not Assessed, HER2+) - Signed by [name], MD on 03/17/2018   03/19/2018 -  Chemotherapy   She received chemo with Taxotere, Herceptin and Perjeta   03/21/2018 Echocardiogram   IMPRESSIONS 1. The left ventricle has normal systolic function with an ejection fraction of 60-65%. The cavity size was normal. Left ventricular diastolic Doppler parameters are consistent with impaired relaxation.  2. The right ventricle has normal systolic function. The cavity was normal. There is no increase in right ventricular wall thickness.  3. The mitral valve is normal in structure.  4. The tricuspid valve is normal in structure.  5. Strain imaging performed but not reported due to interpreter judgement, secondary to suboptimal image quality.   03/24/2018 Procedure   Successful placement of a right IJ approach Power Port with ultrasound and fluoroscopic guidance. The catheter is ready for use.   04/07/2018 - 04/16/2018 Hospital Admission   She was admitted for pneumonia   04/07/2018 - 04/16/2018 Hospital Admission   She was admitted to the hospital for  pneumonia and respiratory failure   04/25/2018 Imaging   Retropharyngeal fluid collection compatible with effusion or possibly abscess. Soft tissue thickening extends into the right neck and surrounds the right carotid bifurcation and right internal carotid artery. There is also streaky density in the right lateral neck soft tissues with a focal 7.6 Mm lymph node in the right lateral neck. Stranding surrounds the right submandibular gland.   Findings are most compatible with infection. Favor pharyngitis with retropharyngeal effusion/abscess. Soft tissue thickening around the right carotid most compatible with infection rather than tumor. Right jugular vein is patent.  Multiple nodular densities in the right upper lobe may represent residual pneumonia or metastatic disease. Interval improvement in right upper lobe infiltrate compared with CT of 04/08/2018   04/25/2018 - 05/06/2018 Hospital Admission   She was admitted for retropharyngeal abscess   04/27/2018 Imaging   No evidence of large central pulmonary embolus is noted in the main pulmonary artery or the main portions of the right and left pulmonary arteries. However, evaluation of the lower lobe branches, particularly on the right, is limited due to respiratory motion artifact and other limiting issues. Pulmonary emboli and smaller peripheral branches can not be excluded on the basis of this exam.  Large left pleural effusion is noted with complete atelectasis of the left lower lobe. Increased left upper lobe opacity is noted posteriorly concerning for atelectasis or pneumonia.  Improved right upper lobe and lower lobe opacities are noted suggesting improving pneumonia or atelectasis.  15 mm right paratracheal lymph  node is noted which is enlarged compared to prior exam; it is uncertain if this is metastatic or inflammatory in etiology.  Hepatic metastatic lesions are again noted.  Aortic Atherosclerosis (ICD10-I70.0).    04/27/2018  Procedure   Successful ultrasound guided left thoracentesis yielding 120 mL of blood-tinged of pleural fluid.   06/11/2018 Imaging   1. No evidence of pulmonary emboli. 2. Extensive bilateral lower lobe consolidation. 3. Small pleural effusions. 4. 2.5 cm right upper lobe nodule, enlarged from 04/27/2018 and which may reflect a metastasis or primary lung cancer. Stable to mild enlargement of subcentimeter nodules in both upper lobes consistent with known metastases. 5. Decreased size of liver metastases. 6. Unchanged mild mediastinal and right hilar lymphadenopathy. 7. Aortic Atherosclerosis (ICD10-I70.0).   06/11/2018 - 06/22/2018 Hospital Admission   She was hospitalized for infection and SVT   07/18/2018 Imaging   MRI brain 1. Regression of solitary right occipital lobe metastasis and edema. No new brain metastasis. 2. New ill-defined enhancement at the atlantooccipital membrane, presumably a metastatic focus. The adjacent bone marrow is abnormally low signal but not enhancing to the degree of the extraosseous lesion. Marrow signal is likely from anemia.     Metastasis to brain ([place])  03/17/2018 Initial Diagnosis   Metastasis to brain [facility])   Metastasis to liver ([place])  03/17/2018 Initial Diagnosis   Metastasis to liver [facility])   Metastasis to bone ([place])  03/17/2018 Initial Diagnosis   Metastasis to bone ([facility])     REVIEW OF SYSTEMS:   Constitutional: Denies fevers, chills or abnormal weight loss Eyes: Denies blurriness of vision Ears, nose, mouth, throat, and face: Denies mucositis or sore throat Respiratory: Denies cough, dyspnea or wheezes Cardiovascular: Denies palpitation, chest discomfort or lower extremity swelling Gastrointestinal:  Denies nausea, heartburn or change in bowel habits Skin: Denies abnormal skin rashes Lymphatics: Denies new lymphadenopathy or easy bruising Neurological:Denies numbness, tingling or new weaknesses Behavioral/Psych: Mood is stable, no new  changes  All other systems were reviewed with the patient and are negative.  I have reviewed the past medical history, past surgical history, social history and family history with the patient and they are unchanged from previous note.  ALLERGIES:  is allergic to nsaids.  MEDICATIONS:  Current Outpatient Medications  Medication Sig Dispense Refill  . ACCU-CHEK AVIVA PLUS test strip     . ACCU-CHEK SOFTCLIX LANCETS lancets     . acetaminophen (TYLENOL) 325 MG tablet Take 2 tablets (650 mg total) by mouth every 6 (six) hours as needed for mild pain (or Fever >/= 101). 30 tablet 0  . albuterol (PROAIR HFA) 108 (90 Base) MCG/ACT inhaler Inhale 2 puffs into the lungs every 6 (six) hours as needed for wheezing or shortness of breath. 1 Inhaler 1  . amiodarone (PACERONE) 200 MG tablet TAKE 1 TABLET BY MOUTH EVERY DAY 30 tablet 0  . benzonatate (TESSALON) 200 MG capsule Take 1 capsule (200 mg total) by mouth 3 (three) times daily as needed for cough (use second). 20 capsule 0  . blood glucose meter kit and supplies KIT Dispense based on patient and insurance preference. Use up to four times daily as directed. (FOR ICD-9 250.00, 250.01). 1 each 0  . esomeprazole (NEXIUM) 40 MG capsule Take 1 capsule (40 mg total) by mouth daily at 12 noon. 90 capsule 3  . feeding supplement, ENSURE ENLIVE, (ENSURE ENLIVE) LIQD Take 237 mLs by mouth 3 (three) times daily between meals. 237 mL 12  . fluticasone (FLOVENT HFA) 44  MCG/ACT inhaler Inhale 2 puffs into the lungs 2 (two) times daily. 3 Inhaler 1  . insulin aspart (NOVOLOG) 100 UNIT/ML FlexPen Inject 8 Units into the skin 3 (three) times daily with meals. (Patient taking differently: Inject 8 Units into the skin 3 (three) times daily with meals. Holds dose if not going to eat.) 15 mL 11  . Insulin Detemir (LEVEMIR) 100 UNIT/ML Pen Inject 10 Units into the skin daily. (Patient taking differently: Inject 10 Units into the skin at bedtime. ) 15 mL 0  . Insulin Pen  Needle 31G X 5 MM MISC 1 Device by Does not apply route QID. For use with insulin pens 100 each 0  . lidocaine-prilocaine (EMLA) cream Apply to affected area once (Patient taking differently: Apply 1 application topically as needed (port access). Apply to affected area once) 30 g 3  . LORazepam (ATIVAN) 0.5 MG tablet Take 1 tab po 30 minutes prior to radiation or MRI (Patient taking differently: Take 0.5 mg by mouth See admin instructions. Take 30 minutes prior to radiation or MRI) 30 tablet 0  . metFORMIN (GLUCOPHAGE) 500 MG tablet Take 500 mg by mouth daily with breakfast.     . metoprolol tartrate (LOPRESSOR) 25 MG tablet Take 0.5 tablets (12.5 mg total) by mouth 2 (two) times daily. 60 tablet 0  . mirtazapine (REMERON) 30 MG tablet TAKE 1 TABLET (30 MG TOTAL) BY MOUTH AT BEDTIME. 90 tablet 7  . montelukast (SINGULAIR) 10 MG tablet Take 1 tablet (10 mg total) by mouth daily. 90 tablet 1  . morphine (MSIR) 15 MG tablet Take 1 tablet (15 mg total) by mouth every 4 (four) hours as needed for severe pain. 60 tablet 0  . Multiple Vitamin (MULTIVITAMIN WITH MINERALS) TABS tablet Take 1 tablet by mouth daily.    . ondansetron (ZOFRAN) 8 MG tablet Take 1 tablet (8 mg total) by mouth every 8 (eight) hours as needed for nausea or vomiting. 60 tablet 1  . prochlorperazine (COMPAZINE) 10 MG tablet Take 10 mg by mouth every 6 (six) hours as needed for nausea or vomiting.      No current facility-administered medications for this visit.    Facility-Administered Medications Ordered in Other Visits  Medication Dose Route Frequency Provider Last Rate Last Dose  . heparin lock flush 100 unit/mL  250 Units Intracatheter PRN Alvy Bimler, Ciria Bernardini, MD      . HYDROmorphone (DILAUDID) injection 2 mg  2 mg Intravenous Q3H PRN Alvy Bimler, Saige Canton, MD   2 mg at 09/01/18 1000  . sodium chloride flush (NS) 0.9 % injection 10 mL  10 mL Intracatheter PRN Courtne Lighty, MD      . sodium chloride flush (NS) 0.9 % injection 3 mL  3 mL  Intracatheter PRN Alvy Bimler, Marleah Beever, MD        PHYSICAL EXAMINATION: ECOG PERFORMANCE STATUS: 4 - Bedbound GENERAL:alert, no distress and comfortable SKIN: Noted multiple pressure sores and injuries EYES: normal, Conjunctiva are pink and non-injected, sclera clear OROPHARYNX:no exudate, no erythema and lips, buccal mucosa, and tongue normal  NECK: supple, thyroid normal size, non-tender, without nodularity LYMPH:  no palpable lymphadenopathy in the cervical, axillary or inguinal LUNGS: clear to auscultation and percussion with normal breathing effort HEART: Mild tachycardia, and no murmurs and no lower extremity edema ABDOMEN:abdomen soft, non-tender and normal bowel sounds Musculoskeletal:no cyanosis of digits and no clubbing  NEURO: alert & oriented x 3 with fluent speech, she is profoundly weak with signs of critical myopathy  LABORATORY DATA:  I have reviewed the data as listed    Component Value Date/Time   NA 136 09/01/2018 0845   K 4.0 09/01/2018 0845   CL 97 (L) 09/01/2018 0845   CO2 29 09/01/2018 0845   GLUCOSE 178 (H) 09/01/2018 0845   BUN 16 09/01/2018 0845   CREATININE 0.55 09/01/2018 0845   CALCIUM 8.5 (L) 09/01/2018 0845   PROT 5.9 (L) 09/01/2018 0845   ALBUMIN 1.6 (L) 09/01/2018 0845   AST 11 (L) 09/01/2018 0845   ALT 9 09/01/2018 0845   ALKPHOS 182 (H) 09/01/2018 0845   BILITOT 1.7 (H) 09/01/2018 0845   GFRNONAA >60 09/01/2018 0845   GFRAA >60 09/01/2018 0845    No results found for: SPEP, UPEP  Lab Results  Component Value Date   WBC 10.5 09/01/2018   NEUTROABS 7.5 09/01/2018   HGB 10.0 (L) 09/01/2018   HCT 31.4 (L) 09/01/2018   MCV 88.0 09/01/2018   PLT 15 (L) 09/01/2018      Chemistry      Component Value Date/Time   NA 136 09/01/2018 0845   K 4.0 09/01/2018 0845   CL 97 (L) 09/01/2018 0845   CO2 29 09/01/2018 0845   BUN 16 09/01/2018 0845   CREATININE 0.55 09/01/2018 0845      Component Value Date/Time   CALCIUM 8.5 (L) 09/01/2018 0845    ALKPHOS 182 (H) 09/01/2018 0845   AST 11 (L) 09/01/2018 0845   ALT 9 09/01/2018 0845   BILITOT 1.7 (H) 09/01/2018 0845       All questions were answered. The patient knows to call the clinic with any problems, questions or concerns. No barriers to learning was detected.  I spent 40 minutes counseling the patient face to face. The total time spent in the appointment was 55 minutes and more than 50% was on counseling and review of test results  [name], MD 09/01/2018 12:50 PM

## 2018-09-01 NOTE — Assessment & Plan Note (Signed)
She has not participated with physical therapy for over 3 weeks I recommend the patient and family to call physical therapy to resume therapy as tolerated

## 2018-09-01 NOTE — Assessment & Plan Note (Signed)
She has significant complication from each cycle of treatment despite dose adjustment She is slowly recovering from side effects of therapy She is not ready to resume chemotherapy Continue aggressive supportive care now

## 2018-09-01 NOTE — Assessment & Plan Note (Signed)
She has significant pain secondary to multiple new decubitus ulcer I refill her prescription morphine I had extensive discussion with the patient and family members about the importance of different positioning, aggressive wound care and physical therapy

## 2018-09-01 NOTE — Patient Instructions (Signed)

## 2018-09-02 ENCOUNTER — Telehealth: Payer: Self-pay | Admitting: Hematology and Oncology

## 2018-09-02 ENCOUNTER — Other Ambulatory Visit: Payer: Self-pay

## 2018-09-02 DIAGNOSIS — R5381 Other malaise: Secondary | ICD-10-CM

## 2018-09-02 DIAGNOSIS — C787 Secondary malignant neoplasm of liver and intrahepatic bile duct: Secondary | ICD-10-CM

## 2018-09-02 DIAGNOSIS — C7801 Secondary malignant neoplasm of right lung: Secondary | ICD-10-CM

## 2018-09-02 DIAGNOSIS — C7931 Secondary malignant neoplasm of brain: Secondary | ICD-10-CM

## 2018-09-02 DIAGNOSIS — G893 Neoplasm related pain (acute) (chronic): Secondary | ICD-10-CM

## 2018-09-02 DIAGNOSIS — C50919 Malignant neoplasm of unspecified site of unspecified female breast: Secondary | ICD-10-CM

## 2018-09-02 LAB — PREPARE PLATELET PHERESIS: Unit division: 0

## 2018-09-02 LAB — BPAM PLATELET PHERESIS
Blood Product Expiration Date: 202008042359
ISSUE DATE / TIME: 202008031037
Unit Type and Rh: 6200

## 2018-09-02 NOTE — Progress Notes (Signed)
Order for DME fully electric hospital bed with tray table has been faxed to [facility] (of [facility]) at [phone]  Contact number:  [phone] intake dept

## 2018-09-02 NOTE — Telephone Encounter (Signed)
I talk with patient regarding schedule  

## 2018-09-05 ENCOUNTER — Other Ambulatory Visit: Payer: Self-pay | Admitting: Hematology and Oncology

## 2018-09-05 ENCOUNTER — Inpatient Hospital Stay: Payer: BC Managed Care – PPO | Admitting: Hematology and Oncology

## 2018-09-05 ENCOUNTER — Other Ambulatory Visit: Payer: BC Managed Care – PPO

## 2018-09-05 ENCOUNTER — Telehealth: Payer: Self-pay | Admitting: *Deleted

## 2018-09-05 ENCOUNTER — Ambulatory Visit: Payer: BC Managed Care – PPO

## 2018-09-05 NOTE — Telephone Encounter (Signed)
Telephone call from patient's sister. Deserae was too weak to get into the car for her appointments today. She got frustrated and now refuses to come in. She wants to be rescheduled for tomorrow. Advised sister without the lab work we are unable to schedule her to receive blood on Saturdays. [name] would like to reschedule the appointments for first available next week. Advised sister if patient is this weak she may need care sooner and she should call 911 and have patient transported to ED. Sister refuses saying Sunshine will not be seen in the ED.   Appts for today cancelled.

## 2018-09-07 ENCOUNTER — Emergency Department (HOSPITAL_COMMUNITY): Payer: BC Managed Care – PPO

## 2018-09-07 ENCOUNTER — Encounter (HOSPITAL_COMMUNITY): Payer: Self-pay

## 2018-09-07 ENCOUNTER — Other Ambulatory Visit: Payer: Self-pay

## 2018-09-07 ENCOUNTER — Inpatient Hospital Stay (HOSPITAL_COMMUNITY)
Admission: EM | Admit: 2018-09-07 | Discharge: 2018-09-30 | DRG: 871 | Disposition: E | Payer: BC Managed Care – PPO | Attending: Internal Medicine | Admitting: Internal Medicine

## 2018-09-07 DIAGNOSIS — Z7951 Long term (current) use of inhaled steroids: Secondary | ICD-10-CM | POA: Diagnosis not present

## 2018-09-07 DIAGNOSIS — F419 Anxiety disorder, unspecified: Secondary | ICD-10-CM | POA: Diagnosis present

## 2018-09-07 DIAGNOSIS — T451X5A Adverse effect of antineoplastic and immunosuppressive drugs, initial encounter: Secondary | ICD-10-CM | POA: Diagnosis present

## 2018-09-07 DIAGNOSIS — L8990 Pressure ulcer of unspecified site, unspecified stage: Secondary | ICD-10-CM | POA: Diagnosis present

## 2018-09-07 DIAGNOSIS — C787 Secondary malignant neoplasm of liver and intrahepatic bile duct: Secondary | ICD-10-CM | POA: Diagnosis present

## 2018-09-07 DIAGNOSIS — C50919 Malignant neoplasm of unspecified site of unspecified female breast: Secondary | ICD-10-CM | POA: Diagnosis not present

## 2018-09-07 DIAGNOSIS — G7281 Critical illness myopathy: Secondary | ICD-10-CM | POA: Diagnosis present

## 2018-09-07 DIAGNOSIS — L89620 Pressure ulcer of left heel, unstageable: Secondary | ICD-10-CM | POA: Diagnosis present

## 2018-09-07 DIAGNOSIS — D696 Thrombocytopenia, unspecified: Secondary | ICD-10-CM | POA: Diagnosis present

## 2018-09-07 DIAGNOSIS — D649 Anemia, unspecified: Secondary | ICD-10-CM

## 2018-09-07 DIAGNOSIS — Z794 Long term (current) use of insulin: Secondary | ICD-10-CM

## 2018-09-07 DIAGNOSIS — C7931 Secondary malignant neoplasm of brain: Secondary | ICD-10-CM | POA: Diagnosis present

## 2018-09-07 DIAGNOSIS — L89150 Pressure ulcer of sacral region, unstageable: Secondary | ICD-10-CM | POA: Diagnosis present

## 2018-09-07 DIAGNOSIS — F329 Major depressive disorder, single episode, unspecified: Secondary | ICD-10-CM | POA: Diagnosis present

## 2018-09-07 DIAGNOSIS — A4181 Sepsis due to Enterococcus: Secondary | ICD-10-CM | POA: Diagnosis present

## 2018-09-07 DIAGNOSIS — D6481 Anemia due to antineoplastic chemotherapy: Secondary | ICD-10-CM | POA: Diagnosis not present

## 2018-09-07 DIAGNOSIS — E43 Unspecified severe protein-calorie malnutrition: Secondary | ICD-10-CM | POA: Diagnosis present

## 2018-09-07 DIAGNOSIS — C7951 Secondary malignant neoplasm of bone: Secondary | ICD-10-CM | POA: Diagnosis present

## 2018-09-07 DIAGNOSIS — C799 Secondary malignant neoplasm of unspecified site: Secondary | ICD-10-CM | POA: Diagnosis present

## 2018-09-07 DIAGNOSIS — L89313 Pressure ulcer of right buttock, stage 3: Secondary | ICD-10-CM | POA: Diagnosis present

## 2018-09-07 DIAGNOSIS — R6521 Severe sepsis with septic shock: Secondary | ICD-10-CM | POA: Diagnosis present

## 2018-09-07 DIAGNOSIS — B9620 Unspecified Escherichia coli [E. coli] as the cause of diseases classified elsewhere: Secondary | ICD-10-CM | POA: Diagnosis present

## 2018-09-07 DIAGNOSIS — E440 Moderate protein-calorie malnutrition: Secondary | ICD-10-CM

## 2018-09-07 DIAGNOSIS — I427 Cardiomyopathy due to drug and external agent: Secondary | ICD-10-CM | POA: Diagnosis present

## 2018-09-07 DIAGNOSIS — I5032 Chronic diastolic (congestive) heart failure: Secondary | ICD-10-CM | POA: Diagnosis present

## 2018-09-07 DIAGNOSIS — Z66 Do not resuscitate: Secondary | ICD-10-CM | POA: Diagnosis present

## 2018-09-07 DIAGNOSIS — D61818 Other pancytopenia: Secondary | ICD-10-CM | POA: Diagnosis present

## 2018-09-07 DIAGNOSIS — G9340 Encephalopathy, unspecified: Secondary | ICD-10-CM | POA: Diagnosis present

## 2018-09-07 DIAGNOSIS — L89102 Pressure ulcer of unspecified part of back, stage 2: Secondary | ICD-10-CM | POA: Diagnosis present

## 2018-09-07 DIAGNOSIS — R627 Adult failure to thrive: Secondary | ICD-10-CM | POA: Diagnosis present

## 2018-09-07 DIAGNOSIS — E11649 Type 2 diabetes mellitus with hypoglycemia without coma: Secondary | ICD-10-CM | POA: Diagnosis present

## 2018-09-07 DIAGNOSIS — Z09 Encounter for follow-up examination after completed treatment for conditions other than malignant neoplasm: Secondary | ICD-10-CM

## 2018-09-07 DIAGNOSIS — E1165 Type 2 diabetes mellitus with hyperglycemia: Secondary | ICD-10-CM | POA: Diagnosis not present

## 2018-09-07 DIAGNOSIS — Z20828 Contact with and (suspected) exposure to other viral communicable diseases: Secondary | ICD-10-CM | POA: Diagnosis present

## 2018-09-07 DIAGNOSIS — C7800 Secondary malignant neoplasm of unspecified lung: Secondary | ICD-10-CM | POA: Diagnosis present

## 2018-09-07 DIAGNOSIS — G9341 Metabolic encephalopathy: Secondary | ICD-10-CM | POA: Diagnosis present

## 2018-09-07 DIAGNOSIS — A419 Sepsis, unspecified organism: Secondary | ICD-10-CM | POA: Diagnosis present

## 2018-09-07 DIAGNOSIS — Z79899 Other long term (current) drug therapy: Secondary | ICD-10-CM

## 2018-09-07 DIAGNOSIS — D61810 Antineoplastic chemotherapy induced pancytopenia: Secondary | ICD-10-CM | POA: Diagnosis present

## 2018-09-07 DIAGNOSIS — R609 Edema, unspecified: Secondary | ICD-10-CM | POA: Diagnosis not present

## 2018-09-07 DIAGNOSIS — R509 Fever, unspecified: Secondary | ICD-10-CM

## 2018-09-07 DIAGNOSIS — N390 Urinary tract infection, site not specified: Secondary | ICD-10-CM | POA: Diagnosis present

## 2018-09-07 DIAGNOSIS — L89892 Pressure ulcer of other site, stage 2: Secondary | ICD-10-CM | POA: Diagnosis not present

## 2018-09-07 DIAGNOSIS — Z7401 Bed confinement status: Secondary | ICD-10-CM

## 2018-09-07 DIAGNOSIS — E876 Hypokalemia: Secondary | ICD-10-CM | POA: Diagnosis present

## 2018-09-07 DIAGNOSIS — E871 Hypo-osmolality and hyponatremia: Secondary | ICD-10-CM | POA: Diagnosis present

## 2018-09-07 DIAGNOSIS — Z171 Estrogen receptor negative status [ER-]: Secondary | ICD-10-CM

## 2018-09-07 DIAGNOSIS — Z7189 Other specified counseling: Secondary | ICD-10-CM | POA: Diagnosis not present

## 2018-09-07 DIAGNOSIS — R52 Pain, unspecified: Secondary | ICD-10-CM | POA: Diagnosis not present

## 2018-09-07 DIAGNOSIS — R0602 Shortness of breath: Secondary | ICD-10-CM | POA: Diagnosis not present

## 2018-09-07 DIAGNOSIS — Z515 Encounter for palliative care: Secondary | ICD-10-CM

## 2018-09-07 DIAGNOSIS — J45909 Unspecified asthma, uncomplicated: Secondary | ICD-10-CM | POA: Diagnosis present

## 2018-09-07 DIAGNOSIS — J9601 Acute respiratory failure with hypoxia: Secondary | ICD-10-CM | POA: Diagnosis not present

## 2018-09-07 DIAGNOSIS — L89819 Pressure ulcer of head, unspecified stage: Secondary | ICD-10-CM | POA: Diagnosis present

## 2018-09-07 LAB — COMPREHENSIVE METABOLIC PANEL
ALT: 16 U/L (ref 0–44)
AST: 23 U/L (ref 15–41)
Albumin: 1.7 g/dL — ABNORMAL LOW (ref 3.5–5.0)
Alkaline Phosphatase: 176 U/L — ABNORMAL HIGH (ref 38–126)
Anion gap: 10 (ref 5–15)
BUN: 12 mg/dL (ref 6–20)
CO2: 24 mmol/L (ref 22–32)
Calcium: 7.3 mg/dL — ABNORMAL LOW (ref 8.9–10.3)
Chloride: 94 mmol/L — ABNORMAL LOW (ref 98–111)
Creatinine, Ser: 0.49 mg/dL (ref 0.44–1.00)
GFR calc Af Amer: >60 mL/min (ref >60)
GFR calc non Af Amer: >60 mL/min (ref >60)
Glucose, Bld: 214 mg/dL — ABNORMAL HIGH (ref 70–99)
Potassium: 3.5 mmol/L (ref 3.5–5.1)
Sodium: 128 mmol/L — ABNORMAL LOW (ref 135–145)
Total Bilirubin: 1.8 mg/dL — ABNORMAL HIGH (ref 0.3–1.2)
Total Protein: 5.5 g/dL — ABNORMAL LOW (ref 6.5–8.1)

## 2018-09-07 LAB — CBC WITH DIFFERENTIAL/PLATELET
Abs Immature Granulocytes: 0.7 10*3/uL — ABNORMAL HIGH (ref 0.00–0.07)
Basophils Absolute: 0.1 10*3/uL (ref 0.0–0.1)
Basophils Relative: 2 %
Eosinophils Absolute: 0 10*3/uL (ref 0.0–0.5)
Eosinophils Relative: 0 %
HCT: 21 % — ABNORMAL LOW (ref 36.0–46.0)
Hemoglobin: 6.6 g/dL — CL (ref 12.0–15.0)
Immature Granulocytes: 12 %
Lymphocytes Relative: 12 %
Lymphs Abs: 0.7 10*3/uL (ref 0.7–4.0)
MCH: 28.4 pg (ref 26.0–34.0)
MCHC: 31.4 g/dL (ref 30.0–36.0)
MCV: 90.5 fL (ref 80.0–100.0)
Monocytes Absolute: 0.7 10*3/uL (ref 0.1–1.0)
Monocytes Relative: 11 %
Neutro Abs: 3.6 10*3/uL (ref 1.7–7.7)
Neutrophils Relative %: 63 %
Platelets: 5 10*3/uL — CL (ref 150–400)
RBC: 2.32 MIL/uL — ABNORMAL LOW (ref 3.87–5.11)
RDW: 15.8 % — ABNORMAL HIGH (ref 11.5–15.5)
WBC: 5.8 10*3/uL (ref 4.0–10.5)
nRBC: 0 % (ref 0.0–0.2)

## 2018-09-07 LAB — CBG MONITORING, ED
Glucose-Capillary: 103 mg/dL — ABNORMAL HIGH (ref 70–99)
Glucose-Capillary: 19 mg/dL — CL (ref 70–99)
Glucose-Capillary: 118 mg/dL — ABNORMAL HIGH (ref 70–99)

## 2018-09-07 LAB — PROTIME-INR
INR: 1.5 — ABNORMAL HIGH (ref 0.8–1.2)
Prothrombin Time: 18.2 seconds — ABNORMAL HIGH (ref 11.4–15.2)

## 2018-09-07 LAB — LACTIC ACID, PLASMA
Lactic Acid, Venous: 1.2 mmol/L (ref 0.5–1.9)
Lactic Acid, Venous: 1.5 mmol/L (ref 0.5–1.9)

## 2018-09-07 LAB — GLUCOSE, CAPILLARY: Glucose-Capillary: 116 mg/dL — ABNORMAL HIGH (ref 70–99)

## 2018-09-07 LAB — SARS CORONAVIRUS 2 BY RT PCR (HOSPITAL ORDER, PERFORMED IN ~~LOC~~ HOSPITAL LAB): SARS Coronavirus 2: NEGATIVE

## 2018-09-07 LAB — PREPARE RBC (CROSSMATCH)

## 2018-09-07 LAB — APTT: aPTT: 39 seconds — ABNORMAL HIGH (ref 24–36)

## 2018-09-07 MED ORDER — VANCOMYCIN HCL IN DEXTROSE 1-5 GM/200ML-% IV SOLN
1000.0000 mg | Freq: Once | INTRAVENOUS | Status: AC
  Administered 2018-09-08: 1000 mg via INTRAVENOUS
  Filled 2018-09-07: qty 200

## 2018-09-07 MED ORDER — BUDESONIDE 0.25 MG/2ML IN SUSP
0.2500 mg | Freq: Two times a day (BID) | RESPIRATORY_TRACT | Status: DC
  Administered 2018-09-08 – 2018-09-13 (×12): 0.25 mg via RESPIRATORY_TRACT
  Filled 2018-09-07 (×13): qty 2

## 2018-09-07 MED ORDER — INSULIN ASPART 100 UNIT/ML ~~LOC~~ SOLN
0.0000 [IU] | Freq: Three times a day (TID) | SUBCUTANEOUS | Status: DC
  Administered 2018-09-08 (×2): 2 [IU] via SUBCUTANEOUS
  Administered 2018-09-08: 3 [IU] via SUBCUTANEOUS
  Administered 2018-09-09: 2 [IU] via SUBCUTANEOUS
  Administered 2018-09-09: 3 [IU] via SUBCUTANEOUS
  Administered 2018-09-09: 1 [IU] via SUBCUTANEOUS
  Administered 2018-09-10: 3 [IU] via SUBCUTANEOUS
  Administered 2018-09-10 – 2018-09-12 (×5): 2 [IU] via SUBCUTANEOUS
  Administered 2018-09-12 – 2018-09-13 (×2): 3 [IU] via SUBCUTANEOUS
  Administered 2018-09-13 (×2): 2 [IU] via SUBCUTANEOUS

## 2018-09-07 MED ORDER — SODIUM CHLORIDE 0.9% FLUSH: 10.0000 mL | INTRAVENOUS | Status: DC | PRN

## 2018-09-07 MED ORDER — ALBUTEROL SULFATE (2.5 MG/3ML) 0.083% IN NEBU
3.0000 mL | INHALATION_SOLUTION | Freq: Four times a day (QID) | RESPIRATORY_TRACT | Status: DC | PRN
  Administered 2018-09-17: 3 mL via RESPIRATORY_TRACT
  Filled 2018-09-07: qty 3

## 2018-09-07 MED ORDER — SODIUM CHLORIDE 0.9 % IV SOLN: 10.0000 mL/h | Freq: Once | INTRAVENOUS | Status: DC

## 2018-09-07 MED ORDER — CHLORHEXIDINE GLUCONATE CLOTH 2 % EX PADS
6.0000 | MEDICATED_PAD | Freq: Every day | CUTANEOUS | Status: DC
  Administered 2018-09-08 – 2018-09-19 (×10): 6 via TOPICAL

## 2018-09-07 MED ORDER — AMIODARONE HCL 200 MG PO TABS
200.0000 mg | ORAL_TABLET | Freq: Every day | ORAL | Status: DC
  Administered 2018-09-08 – 2018-09-20 (×13): 200 mg via ORAL
  Filled 2018-09-07 (×13): qty 1

## 2018-09-07 MED ORDER — SODIUM CHLORIDE 0.9 % IV BOLUS
1000.0000 mL | Freq: Once | INTRAVENOUS | Status: AC
  Administered 2018-09-07: 1000 mL via INTRAVENOUS

## 2018-09-07 MED ORDER — FLUTICASONE PROPIONATE HFA 44 MCG/ACT IN AERO: 2.0000 | INHALATION_SPRAY | Freq: Two times a day (BID) | RESPIRATORY_TRACT | Status: DC

## 2018-09-07 MED ORDER — METRONIDAZOLE IN NACL 5-0.79 MG/ML-% IV SOLN
500.0000 mg | Freq: Once | INTRAVENOUS | Status: AC
  Administered 2018-09-07: 500 mg via INTRAVENOUS
  Filled 2018-09-07: qty 100

## 2018-09-07 MED ORDER — SODIUM CHLORIDE 0.9 % IV SOLN
2.0000 g | Freq: Once | INTRAVENOUS | Status: AC
  Administered 2018-09-07: 2 g via INTRAVENOUS
  Filled 2018-09-07: qty 2

## 2018-09-07 MED ORDER — MORPHINE SULFATE 15 MG PO TABS
15.0000 mg | ORAL_TABLET | ORAL | Status: DC | PRN
  Administered 2018-09-08 – 2018-09-20 (×23): 15 mg via ORAL
  Filled 2018-09-07 (×25): qty 1

## 2018-09-07 MED ORDER — ENSURE ENLIVE PO LIQD
237.0000 mL | Freq: Three times a day (TID) | ORAL | Status: DC
  Administered 2018-09-08 – 2018-09-09 (×2): 237 mL via ORAL

## 2018-09-07 MED ORDER — INSULIN ASPART 100 UNIT/ML ~~LOC~~ SOLN
0.0000 [IU] | Freq: Every day | SUBCUTANEOUS | Status: DC
  Administered 2018-09-11: 2 [IU] via SUBCUTANEOUS

## 2018-09-07 MED ORDER — HYDROMORPHONE HCL 1 MG/ML IJ SOLN
0.5000 mg | Freq: Once | INTRAMUSCULAR | Status: AC
  Administered 2018-09-07: 0.5 mg via INTRAVENOUS
  Filled 2018-09-07: qty 1

## 2018-09-07 MED ORDER — SODIUM CHLORIDE 0.9 % IV SOLN
INTRAVENOUS | Status: AC
  Administered 2018-09-07: 50 mL/h via INTRAVENOUS

## 2018-09-07 MED ORDER — SODIUM CHLORIDE 0.9% FLUSH
10.0000 mL | Freq: Two times a day (BID) | INTRAVENOUS | Status: DC
  Administered 2018-09-08: 10 mL
  Administered 2018-09-08: 20 mL
  Administered 2018-09-09 – 2018-09-14 (×8): 10 mL
  Administered 2018-09-15: 40 mL
  Administered 2018-09-17 – 2018-09-19 (×5): 10 mL

## 2018-09-07 MED ORDER — MONTELUKAST SODIUM 10 MG PO TABS
10.0000 mg | ORAL_TABLET | Freq: Every day | ORAL | Status: DC
  Administered 2018-09-08 – 2018-09-18 (×11): 10 mg via ORAL
  Filled 2018-09-07 (×11): qty 1

## 2018-09-07 MED ORDER — LIDOCAINE-PRILOCAINE 2.5-2.5 % EX CREA: 1.0000 "application " | TOPICAL_CREAM | CUTANEOUS | Status: DC | PRN

## 2018-09-07 MED ORDER — DEXTROSE 50 % IV SOLN
INTRAVENOUS | Status: AC
  Administered 2018-09-07: 18:00:00 via INTRAVENOUS
  Filled 2018-09-07: qty 50

## 2018-09-07 MED ORDER — PANTOPRAZOLE SODIUM 40 MG PO TBEC
40.0000 mg | DELAYED_RELEASE_TABLET | Freq: Every day | ORAL | Status: DC
  Administered 2018-09-08 – 2018-09-19 (×12): 40 mg via ORAL
  Filled 2018-09-07 (×12): qty 1

## 2018-09-07 MED ORDER — ADULT MULTIVITAMIN W/MINERALS CH
1.0000 | ORAL_TABLET | Freq: Every day | ORAL | Status: DC
  Administered 2018-09-08 – 2018-09-15 (×8): 1 via ORAL
  Filled 2018-09-07 (×8): qty 1

## 2018-09-07 MED ORDER — ORAL CARE MOUTH RINSE
15.0000 mL | Freq: Two times a day (BID) | OROMUCOSAL | Status: DC
  Administered 2018-09-08 – 2018-09-19 (×14): 15 mL via OROMUCOSAL

## 2018-09-07 NOTE — Progress Notes (Signed)
A consult was received from an ED physician for vanc and cefepime per pharmacy dosing.  The patient's profile has been reviewed for ht/wt/allergies/indication/available labs.   A one time order has been placed for vancomycin 1gm and cefepime 2gm.    Further antibiotics/pharmacy consults should be ordered by admitting physician if indicated.                       Thank you, Dolly Rias RPh 09/05/2018, 7:22 PM Pager [phone]

## 2018-09-07 NOTE — ED Notes (Signed)
ED TO INPATIENT HANDOFF REPORT  Name/Age/Gender Stacy Shelton 58 y.o. female  Code Status Code Status History    Date Active Date Inactive Code Status Order ID Comments User Context   06/11/2018 1535 06/22/2018 1824 Full Code [id number]  Deatra James, MD Inpatient   04/30/2018 1252 05/06/2018 1944 Full Code [id number]  Bonnielee Haff, MD Inpatient   04/29/2018 1255 04/30/2018 1252 Partial Code [id number]  Donita Brooks, NP Inpatient   04/24/2018 1504 04/29/2018 1255 Full Code [id number]  Karmen Bongo, MD Inpatient   04/07/2018 2044 04/16/2018 1858 Full Code [id number]  Toy Baker, MD Inpatient   03/11/2018 0013 03/13/2018 1708 Full Code [id number]  Vianne Bulls, MD ED   Advance Care Planning Activity      Home/SNF/Other Home  Chief Complaint cancer pt; sepsis  Level of Care/Admitting Diagnosis ED Disposition    ED Disposition Condition [facility] Area: [place] [[id number]]  Level of Care: Stepdown [14]  Admit to SDU based on following criteria: Hemodynamic compromise or significant risk of instability:  Patient requiring short term acute titration and management of vasoactive drips, and invasive monitoring (i.e., CVP and Arterial line).  Covid Evaluation: Confirmed COVID Negative  Diagnosis: Acute encephalopathy [[id number]]  Admitting Physician: Bonnell Public [[id number]]  Attending Physician: Dana Allan I [[id number]]  Estimated length of stay: 3 - 4 days  Certification:: I certify this patient will need inpatient services for at least 2 midnights  PT Class (Do Not Modify): Inpatient [101]  PT Acc Code (Do Not Modify): Private [1]       Medical History Past Medical History:  Diagnosis Date  . Asthma   . Diabetes ([place])   . Metastatic breast cancer ([place])   . Seasonal allergies     Allergies Allergies  Allergen Reactions  . Nsaids Anaphylaxis    IV Location/Drains/Wounds Patient Lines/Drains/Airways Status   Active  Line/Drains/Airways    Name:   Placement date:   Placement time:   Site:   Days:   Implanted Port 05/19/18 Right Chest   05/19/18    1210    Chest   111   Peripheral IV 09/23/2018 Right;Posterior Hand   09/20/2018    1740    Hand   less than 1   External Urinary Catheter   06/18/18    0826    -   81   Pressure Injury 06/11/18 Deep Tissue Injury - Purple or maroon localized area of discolored intact skin or blood-filled blister due to damage of underlying soft tissue from pressure and/or shear.   06/11/18    1900     88   Pressure Injury 06/12/18 Deep Tissue Injury - Purple or maroon localized area of discolored intact skin or blood-filled blister due to damage of underlying soft tissue from pressure and/or shear. Deep purple and pink   06/12/18    1600     87          Labs/Imaging Results for orders placed or performed during the hospital encounter of 09/11/2018 (from the past 48 hour(s))  CBG monitoring, ED     Status: Abnormal   Collection Time: 09/06/2018  5:52 PM  Result Value Ref Range   Glucose-Capillary 19 (LL) 70 - 99 mg/dL   Comment 1 Notify RN   Lactic acid, plasma     Status: None   Collection Time: 09/20/2018  6:05 PM  Result Value Ref Range   Lactic Acid, Venous 1.2 0.5 -  1.9 mmol/L    Comment: Performed at [facility], [place] [address], [place], [place] [address]  CBC with Differential     Status: Abnormal   Collection Time: 09/24/2018  6:05 PM  Result Value Ref Range   WBC 5.8 4.0 - 10.5 K/uL   RBC 2.32 (L) 3.87 - 5.11 MIL/uL   Hemoglobin 6.6 (LL) 12.0 - 15.0 g/dL    Comment: This critical result has verified and been called to [facility] by Sandi Mealy on 08 09 2020 at 1924, and has been read back.    HCT 21.0 (L) 36.0 - 46.0 %   MCV 90.5 80.0 - 100.0 fL   MCH 28.4 26.0 - 34.0 pg   MCHC 31.4 30.0 - 36.0 g/dL   RDW 15.8 (H) 11.5 - 15.5 %   Platelets 5 (LL) 150 - 400 K/uL    Comment: REPEATED TO VERIFY PLATELET COUNT CONFIRMED BY SMEAR Immature Platelet  Fraction may be clinically indicated, consider ordering this additional test [id number] THIS CRITICAL RESULT HAS VERIFIED AND BEEN CALLED TO K.ZULETA,RN BY VINCENT WILKINS ON 08 09 2020 AT 1924, AND HAS BEEN READ BACK.     nRBC 0.0 0.0 - 0.2 %   Neutrophils Relative % 63 %   Neutro Abs 3.6 1.7 - 7.7 K/uL   Lymphocytes Relative 12 %   Lymphs Abs 0.7 0.7 - 4.0 K/uL   Monocytes Relative 11 %   Monocytes Absolute 0.7 0.1 - 1.0 K/uL   Eosinophils Relative 0 %   Eosinophils Absolute 0.0 0.0 - 0.5 K/uL   Basophils Relative 2 %   Basophils Absolute 0.1 0.0 - 0.1 K/uL   WBC Morphology VACUOLATED NEUTROPHILS    Smear Review PLATELET COUNT CONFIRMED BY SMEAR    Immature Granulocytes 12 %   Abs Immature Granulocytes 0.70 (H) 0.00 - 0.07 K/uL   Reactive, Benign Lymphocytes PRESENT    Bite Cells PRESENT     Comment: Performed at [facility], [place] [address] [place], [place] [address]  CBG monitoring, ED     Status: Abnormal   Collection Time: 09/09/2018  6:07 PM  Result Value Ref Range   Glucose-Capillary 103 (H) 70 - 99 mg/dL  SARS Coronavirus 2 [facility] order, Performed in [facility] hospital lab) Nasopharyngeal Nasopharyngeal Swab     Status: None   Collection Time: 09/01/2018  6:11 PM   Specimen: Nasopharyngeal Swab  Result Value Ref Range   SARS Coronavirus 2 NEGATIVE NEGATIVE    Comment: (NOTE) If result is NEGATIVE SARS-CoV-2 target nucleic acids are NOT DETECTED. The SARS-CoV-2 RNA is generally detectable in upper and lower  respiratory specimens during the acute phase of infection. The lowest  concentration of SARS-CoV-2 viral copies this assay can detect is 250  copies / mL. A negative result does not preclude SARS-CoV-2 infection  and should not be used as the sole basis for treatment or other  patient management decisions.  A negative result may occur with  improper specimen collection / handling, submission of specimen other  than nasopharyngeal swab,  presence of viral mutation(s) within the  areas targeted by this assay, and inadequate number of viral copies  (<250 copies / mL). A negative result must be combined with clinical  observations, patient history, and epidemiological information. If result is POSITIVE SARS-CoV-2 target nucleic acids are DETECTED. The SARS-CoV-2 RNA is generally detectable in upper and lower  respiratory specimens dur ing the acute phase of infection.  Positive  results are  indicative of active infection with SARS-CoV-2.  Clinical  correlation with patient history and other diagnostic information is  necessary to determine patient infection status.  Positive results do  not rule out bacterial infection or co-infection with other viruses. If result is PRESUMPTIVE POSTIVE SARS-CoV-2 nucleic acids MAY BE PRESENT.   A presumptive positive result was obtained on the submitted specimen  and confirmed on repeat testing.  While 2019 novel coronavirus  (SARS-CoV-2) nucleic acids may be present in the submitted sample  additional confirmatory testing may be necessary for epidemiological  and / or clinical management purposes  to differentiate between  SARS-CoV-2 and other Sarbecovirus currently known to infect humans.  If clinically indicated additional testing with an alternate test  methodology [phone]) is advised. The SARS-CoV-2 RNA is generally  detectable in upper and lower respiratory sp ecimens during the acute  phase of infection. The expected result is Negative. Fact Sheet for Patients:  StrictlyIdeas.no Fact Sheet for Healthcare Providers: BankingDealers.co.za This test is not yet approved or cleared by the [place] FDA and has been authorized for detection and/or diagnosis of SARS-CoV-2 by FDA under an Emergency Use Authorization (EUA).  This EUA will remain in effect (meaning this test can be used) for the duration of the COVID-19 declaration under  Section 564(b)(1) of the Act, 21 U.S.C. section 360bbb-3(b)(1), unless the authorization is terminated or revoked sooner. Performed at [facility], [place] [address] [place], [place] [address]   APTT     Status: Abnormal   Collection Time: 09/15/2018  6:58 PM  Result Value Ref Range   aPTT 39 (H) 24 - 36 seconds    Comment:        IF BASELINE aPTT IS ELEVATED, SUGGEST PATIENT RISK ASSESSMENT BE USED TO DETERMINE APPROPRIATE ANTICOAGULANT THERAPY. Performed at [facility], [place] [address], [place], [place] [address]   Protime-INR     Status: Abnormal   Collection Time: 09/28/2018  6:58 PM  Result Value Ref Range   Prothrombin Time 18.2 (H) 11.4 - 15.2 seconds   INR 1.5 (H) 0.8 - 1.2    Comment: (NOTE) INR goal varies based on device and disease states. Performed at [facility], [place] [address], [place], [place] [address]   Comprehensive metabolic panel     Status: Abnormal   Collection Time: 09/22/2018  6:58 PM  Result Value Ref Range   Sodium 128 (L) 135 - 145 mmol/L   Potassium 3.5 3.5 - 5.1 mmol/L   Chloride 94 (L) 98 - 111 mmol/L   CO2 24 22 - 32 mmol/L   Glucose, Bld 214 (H) 70 - 99 mg/dL   BUN 12 6 - 20 mg/dL   Creatinine, Ser 0.49 0.44 - 1.00 mg/dL   Calcium 7.3 (L) 8.9 - 10.3 mg/dL   Total Protein 5.5 (L) 6.5 - 8.1 g/dL   Albumin 1.7 (L) 3.5 - 5.0 g/dL   AST 23 15 - 41 U/L   ALT 16 0 - 44 U/L   Alkaline Phosphatase 176 (H) 38 - 126 U/L   Total Bilirubin 1.8 (H) 0.3 - 1.2 mg/dL   GFR calc non Af Amer >60 >60 mL/min   GFR calc Af Amer >60 >60 mL/min   Anion gap 10 5 - 15    Comment: Performed at [facility], [place] [address], [place], [place] [address]  CBG monitoring, ED     Status: Abnormal   Collection Time: 09/04/2018  7:45 PM  Result Value Ref Range   Glucose-Capillary 118 (H) 70 - 99 mg/dL  Prepare RBC     Status: None   Collection Time: 09/06/2018  7:46 PM  Result Value Ref Range   Order  Confirmation      ORDER PROCESSED BY BLOOD BANK Performed at [place] [address], [place], [place] [address]   Prepare Pheresed Platelets     Status: None (Preliminary result)   Collection Time: 09/06/2018  7:46 PM  Result Value Ref Range   Unit Number [id number]    Blood Component Type PLTP LI1 PAS    Unit division 00    Status of Unit ALLOCATED    Transfusion Status      OK TO TRANSFUSE Performed at [place] [address] [place], [place] [address]   Lactic acid, plasma     Status: None   Collection Time: 09/22/2018  8:45 PM  Result Value Ref Range   Lactic Acid, Venous 1.5 0.5 - 1.9 mmol/L    Comment: Performed at [facility] [place] [address], [place], [place] [address]  Type and screen [place]     Status: None (Preliminary result)   Collection Time: 09/06/2018  8:45 PM  Result Value Ref Range   ABO/RH(D) A POS    Antibody Screen NEG    Sample Expiration 09/10/2018,2359    Unit Number [id number]    Blood Component Type RBC, LR IRR    Unit division 00    Status of Unit ALLOCATED    Transfusion Status OK TO TRANSFUSE    Crossmatch Result      Compatible Performed at [facility], [place] [address] [place], [place] [address]    Dg Chest Portable 1 View  Result Date: 09/03/2018 CLINICAL DATA:  58 year old female with weakness. History of metastatic breast cancer. EXAM: PORTABLE CHEST 1 VIEW COMPARISON:  Chest radiograph dated 06/22/2018 and CT dated 06/11/2018 FINDINGS: There is shallow inspiration with bibasilar atelectasis. A 4.4 cm rounded opacity in the right upper lobe corresponds to density seen on the prior CT and may represent metastatic disease or primary lung cancer. Additional smaller 14 mm nodular density noted in the right upper lobe more laterally. There is probable small bilateral pleural effusions. No pneumothorax. There is mild cardiomegaly.  Atherosclerotic calcification of aortic arch. Right sided Port-A-Cath with tip in the region of the cavoatrial junction. No acute osseous pathology. IMPRESSION: 1. Small bilateral pleural effusions and bibasilar atelectasis or infiltrate. 2. Right upper lobe rounded opacity corresponding to the density seen on the prior CT. An additional 14 mm right upper lobe pulmonary nodule noted. Electronically Signed   By: Anner Crete M.D.   On: 09/02/2018 19:07    Pending Labs Unresulted Labs (From admission, onward)    Start     Ordered   09/12/2018 2205  Urinalysis, Routine w reflex microscopic  ONCE - STAT,   STAT     09/04/2018 2204   09/05/2018 1805  Culture, blood (routine x 2)  BLOOD CULTURE X 2,   STAT     09/20/2018 1805   Signed and Held  Magnesium  Once,   R     Signed and Held   Signed and Held  Phosphorus  Once,   R     Signed and Held   Signed and [employer], sputum-assessment  Once,   R     Signed and [employer] and  Held  Urine culture  Once,   R     Signed and Held   Signed and Held  TSH  Once,   R     Signed and Held   Signed and Occupational hygienist morning,   R     Signed and Held   Signed and Held  CBC  Tomorrow morning,   R     Signed and Held   Signed and Held  Culture, blood (routine x 2)  BLOOD CULTURE X 2,   R     Signed and Held   Signed and Held  Aerobic/Anaerobic Culture (surgical/deep wound)  Once,   R     Signed and Held   Signed and Held  Procalcitonin - Baseline  ONCE - STAT,   STAT     Signed and Held   Signed and Held  Sodium, urine, random  Once,   R     Signed and Held   Signed and Held  Osmolality  Once,   R     Signed and Held   Signed and Held  Cortisol  Once,   R     Signed and Held   Signed and Held  TSH  Once,   R     Signed and Held          Vitals/Pain Today's Vitals   09/13/2018 2030 09/08/2018 2053 09/20/2018 2100 09/14/2018 2200  BP: (!) 94/50 (!) 94/50 (!) 97/50 (!) 98/49  Pulse: (!) 113 (!) 112 (!) 110 (!) 109  Resp:  (!) 32 (!) 22 (!) 28 (!) 30  Temp:      TempSrc:      SpO2: 100% 100% 100% 100%    Isolation Precautions Airborne and Contact precautions  Medications Medications  vancomycin (VANCOCIN) IVPB 1000 mg/200 mL premix (has no administration in time range)  0.9 %  sodium chloride infusion (has no administration in time range)  0.9 %  sodium chloride infusion (has no administration in time range)  dextrose 50 % solution ( Intravenous Given 09/03/2018 1757)  sodium chloride 0.9 % bolus 1,000 mL (0 mLs Intravenous Stopped 09/06/2018 1937)  ceFEPIme (MAXIPIME) 2 g in sodium chloride 0.9 % 100 mL IVPB (0 g Intravenous Stopped 09/03/2018 2120)  metroNIDAZOLE (FLAGYL) IVPB 500 mg (0 mg Intravenous Stopped 09/05/2018 2228)  HYDROmorphone (DILAUDID) injection 0.5 mg (0.5 mg Intravenous Given 09/11/2018 2040)    Mobility non-ambulatory

## 2018-09-07 NOTE — Progress Notes (Signed)
Pharmacy Antibiotic Note  Stacy Shelton is a 58 y.o. female admitted on 09/20/2018 with acute encephalopathy, likely combined metabolic and toxic.  Pharmacy has been consulted for cefepime and vancomycin dosing.  Plan: Cefepime 2 Gm IV q8h Vancomycin 1 Gm IV q24h for est AUC = 454 Use scr = 0.8 Goal AUC = 400-550 F/u scr/cultres/levels     Temp (24hrs), Avg:100.2 F (37.9 C), Min:99.8 F (37.7 C), Max:100.5 F (38.1 C)  Recent Labs  Lab 09/01/18 0845 09/04/2018 1805 09/27/2018 1858 09/26/2018 2045  WBC 10.5 5.8  --   --   CREATININE 0.55  --  0.49  --   LATICACIDVEN  --  1.2  --  1.5    CrCl cannot be calculated (Unknown ideal weight.).    Allergies  Allergen Reactions  . Nsaids Anaphylaxis    Antimicrobials this admission: 8/9 cefepime >>  8/9 flagyl >> 8/9 vancomycin  >>   Dose adjustments this admission:   Microbiology results:  BCx:   UCx:    Sputum:    MRSA PCR:   Thank you for allowing pharmacy to be a part of this patient's care.  Dorrene German 09/19/2018 10:31 PM

## 2018-09-07 NOTE — H&P (Signed)
History and Physical  Stacy Shelton DQQ:[id number] DOB: 08-Jul-1960 DOA: 09/25/2018  Referring physician: ER provider PCP: Robyne Peers, MD  Outpatient Specialists: Oncologist, Dr. Richarda Blade Patient coming from: Home  Chief Complaint: Altered mental status  HPI:  Patient is a 58 year old Caucasian female with past medical history significant for metastatic breast cancer, diabetes mellitus and asthma.  Due to patient's altered mentation, patient could not or not give any significant history.  Collateral information revealed that patient's mental status has been altered, blood sugar of 19 was documented on presentation.  Low-grade temperature was also documented (100.5 F), hemoglobin of 6.6 g/dL and platelet count of 5 when noted.  UA is pending.  Tests x-ray finding is noted.  Patient has pressure sores.  Systolic blood pressure has been on the low to low normal side, ranging from 83 to 93 mmHg.  Sinus tachycardia of 120 bpm has been documented.  Patient is currently being transfused 1 unit of platelets and 1 unit of packed red blood cells.  Patient has been Pancultured, started on broad-spectrum antibiotics.  Hospitalist team has been asked to admit patient for further assessment and management.  Patient remains full code.  ED Course: Temperature of 100.5 F is noted, systolic blood pressure ranges from 83 to 93 mmHg.  Heart rate is 120 bpm.  Significant labs done reveal WBC of 5.8, hemoglobin of 6.6, platelet count of 5, sodium of 128, chloride of 94 and calcium of 73.  Rapid COVID-19 test came back negative.  Chest x-ray is said to reveal small bilateral pleural effusion, bibasilar atelectasis or infiltrate with right upper lobe rounded opacity seen on prior CT an additional 14 mm right upper lobe pulmonary nodule.  Blood sugar of 19 was documented on presentation, but last blood sugar check was 118.  Patient has been pancultured by emergency room physician and started on broad-spectrum antibiotics.   Hospitalist team has been asked to admit patient for further assessment and management.  Pertinent labs: Kindly see above.  Blood sugar of 19-118.  Bili of 1.5.  Chemistry reveals sodium of 128, potassium of 3.5, chloride of 94, CO2 of 24, BUN of 12, creatinine of 0.49 with a blood sugar of 214.  Albumin is 1.7 with total protein of 5.5.  AST is 23 and ALT is 16.  CBC reveals WBC of 5.8, hemoglobin of 6.6 g/dL, hematocrit of 21, MCV of 90.5 with platelet count of 5.  EKG: Independently reviewed.   Imaging: independently reviewed.   Review of Systems:  Patient is a very poor historian.   Past Medical History:  Diagnosis Date  . Asthma   . Diabetes ([place])   . Metastatic breast cancer ([place])   . Seasonal allergies     Past Surgical History:  Procedure Laterality Date  . IR IMAGING GUIDED PORT INSERTION  03/24/2018  . KIDNEY STONE SURGERY    . SINOSCOPY    . WISDOM TOOTH EXTRACTION       reports that she has never smoked. She has never used smokeless tobacco. She reports current alcohol use. She reports that she does not use drugs.  Allergies  Allergen Reactions  . Nsaids Anaphylaxis    Family History  Problem Relation Age of Onset  . Allergic rhinitis Neg Hx   . Angioedema Neg Hx   . Asthma Neg Hx   . Atopy Neg Hx   . Eczema Neg Hx   . Immunodeficiency Neg Hx   . Urticaria Neg Hx  Prior to Admission medications   Medication Sig Start Date End Date Taking? Authorizing Provider  albuterol (PROAIR HFA) 108 (90 Base) MCG/ACT inhaler Inhale 2 puffs into the lungs every 6 (six) hours as needed for wheezing or shortness of breath. 05/16/17  Yes Padgett, Rae Halsted, MD  amiodarone (PACERONE) 200 MG tablet TAKE 1 TABLET BY MOUTH EVERY DAY Patient taking differently: Take 200 mg by mouth daily.  09/05/18  Yes Gorsuch, Ni, MD  benzonatate (TESSALON) 200 MG capsule Take 1 capsule (200 mg total) by mouth 3 (three) times daily as needed for cough (use second). 06/22/18  Yes  Sheikh, Omair Latif, DO  esomeprazole (NEXIUM) 40 MG capsule Take 1 capsule (40 mg total) by mouth daily at 12 noon. 07/11/18  Yes Gorsuch, Ni, MD  feeding supplement, ENSURE ENLIVE, (ENSURE ENLIVE) LIQD Take 237 mLs by mouth 3 (three) times daily between meals. 06/22/18  Yes Sheikh, Omair Latif, DO  fluticasone (FLOVENT HFA) 44 MCG/ACT inhaler Inhale 2 puffs into the lungs 2 (two) times daily. 05/16/17  Yes Padgett, Rae Halsted, MD  insulin aspart (NOVOLOG) 100 UNIT/ML FlexPen Inject 8 Units into the skin 3 (three) times daily with meals. Patient taking differently: Inject 8 Units into the skin 3 (three) times daily with meals. Holds dose if not going to eat. 06/04/18  Yes Gorsuch, Ni, MD  Insulin Detemir (LEVEMIR) 100 UNIT/ML Pen Inject 10 Units into the skin daily. Patient taking differently: Inject 10 Units into the skin at bedtime.  05/06/18  Yes Dahal, Marlowe Aschoff, MD  metFORMIN (GLUCOPHAGE) 500 MG tablet Take 500 mg by mouth daily with breakfast.    Yes [provider]  metoprolol tartrate (LOPRESSOR) 25 MG tablet Take 0.5 tablets (12.5 mg total) by mouth 2 (two) times daily. 06/22/18  Yes Sheikh, Omair Latif, DO  mirtazapine (REMERON) 30 MG tablet TAKE 1 TABLET (30 MG TOTAL) BY MOUTH AT BEDTIME. 08/19/18  Yes Gorsuch, Ni, MD  montelukast (SINGULAIR) 10 MG tablet Take 1 tablet (10 mg total) by mouth daily. Patient taking differently: Take 10 mg by mouth at bedtime.  08/04/18  Yes Gorsuch, Ni, MD  morphine (MSIR) 15 MG tablet Take 1 tablet (15 mg total) by mouth every 4 (four) hours as needed for severe pain. 09/01/18  Yes [name], MD  Multiple Vitamin (MULTIVITAMIN WITH MINERALS) TABS tablet Take 1 tablet by mouth daily. 05/07/18  Yes Dahal, Marlowe Aschoff, MD  ondansetron (ZOFRAN) 8 MG tablet Take 1 tablet (8 mg total) by mouth every 8 (eight) hours as needed for nausea or vomiting. 08/20/18  Yes Alvy Bimler, Ni, MD  prochlorperazine (COMPAZINE) 10 MG tablet Take 10 mg by mouth every 6 (six) hours as needed  for nausea or vomiting.    Yes [provider]  ACCU-CHEK AVIVA PLUS test strip  03/13/18   [provider]  ACCU-CHEK SOFTCLIX LANCETS lancets  03/13/18   [provider]  acetaminophen (TYLENOL) 325 MG tablet Take 2 tablets (650 mg total) by mouth every 6 (six) hours as needed for mild pain (or Fever >/= 101). Patient not taking: Reported on 09/06/2018 06/22/18   Raiford Noble Latif, DO  blood glucose meter kit and supplies KIT Dispense based on patient and insurance preference. Use up to four times daily as directed. (FOR ICD-9 250.00, 250.01). 03/13/18   Donne Hazel, MD  Insulin Pen Needle 31G X 5 MM MISC 1 Device by Does not apply route QID. For use with insulin pens 03/13/18   Donne Hazel, MD  lidocaine-prilocaine (EMLA) cream Apply to affected area once Patient taking differently: Apply 1 application topically as needed (port access). Apply to affected area once 03/21/18   [name], MD  LORazepam (ATIVAN) 0.5 MG tablet Take 1 tab po 30 minutes prior to radiation or MRI Patient taking differently: Take 0.5 mg by mouth See admin instructions. Take 30 minutes prior to radiation or MRI 03/14/18   Hayden Pedro, PA-C    Physical Exam: Vitals:   09/05/2018 1934 09/20/2018 2030 09/03/2018 2053 09/09/2018 2100  BP: (!) 93/45 (!) 94/50 (!) 94/50 (!) 97/50  Pulse: (!) 120 (!) 113 (!) 112 (!) 110  Resp: (!) 25 (!) 32 (!) 22 (!) 28  Temp:      TempSrc:      SpO2: 99% 100% 100% 100%    Constitutional:  . Appears calm and comfortable.  Patient is chronically ill looking and thin. Eyes:  . Pallor.  No jaundice.  ENMT:  . external ears, nose appear normal.  Dry buccal mucosa. Neck:  . Neck is supple. No JVD Respiratory:  . CTA bilaterally, no w/r/r.  . Respiratory effort normal. No retractions or accessory muscle use Cardiovascular:  . S1S2 . Bilateral lower extremities edema with also days dressed.     Abdomen:  . Abdomen is soft and non tender. Organs  are difficult to assess. Neurologic:  . Awake and alert. . Moves all limbs.  Wt Readings from Last 3 Encounters:  08/06/18 55.8 kg  07/28/18 55.8 kg  06/22/18 57.7 kg    I have personally reviewed following labs and imaging studies  Labs on Admission:  CBC: Recent Labs  Lab 09/01/18 0845 09/29/2018 1805  WBC 10.5 5.8  NEUTROABS 7.5 3.6  HGB 10.0* 6.6*  HCT 31.4* 21.0*  MCV 88.0 90.5  PLT 15* 5*   Basic Metabolic Panel: Recent Labs  Lab 09/01/18 0845 09/02/2018 1858  NA 136 128*  K 4.0 3.5  CL 97* 94*  CO2 29 24  GLUCOSE 178* 214*  BUN 16 12  CREATININE 0.55 0.49  CALCIUM 8.5* 7.3*   Liver Function Tests: Recent Labs  Lab 09/01/18 0845 09/11/2018 1858  AST 11* 23  ALT 9 16  ALKPHOS 182* 176*  BILITOT 1.7* 1.8*  PROT 5.9* 5.5*  ALBUMIN 1.6* 1.7*   No results for input(s): LIPASE, AMYLASE in the last 168 hours. No results for input(s): AMMONIA in the last 168 hours. Coagulation Profile: Recent Labs  Lab 09/20/2018 1858  INR 1.5*   Cardiac Enzymes: No results for input(s): CKTOTAL, CKMB, CKMBINDEX, TROPONINI in the last 168 hours. BNP (last 3 results) No results for input(s): PROBNP in the last 8760 hours. HbA1C: No results for input(s): HGBA1C in the last 72 hours. CBG: Recent Labs  Lab 09/02/2018 1752 09/12/2018 1807 09/06/2018 1945  GLUCAP 19* 103* 118*   Lipid Profile: No results for input(s): CHOL, HDL, LDLCALC, TRIG, CHOLHDL, LDLDIRECT in the last 72 hours. Thyroid Function Tests: No results for input(s): TSH, T4TOTAL, FREET4, T3FREE, THYROIDAB in the last 72 hours. Anemia Panel: No results for input(s): VITAMINB12, FOLATE, FERRITIN, TIBC, IRON, RETICCTPCT in the last 72 hours. Urine analysis:    Component Value Date/Time   COLORURINE YELLOW 06/11/2018 1055   APPEARANCEUR HAZY (A) 06/11/2018 1055   LABSPEC 1.021 06/11/2018 1055   PHURINE 5.0 06/11/2018 1055   GLUCOSEU 50 (A) 06/11/2018 1055   HGBUR MODERATE (A) 06/11/2018 [place] 06/11/2018 [place] 06/11/2018 1055  PROTEINUR 30 (A) 06/11/2018 1055   NITRITE NEGATIVE 06/11/2018 1055   LEUKOCYTESUR SMALL (A) 06/11/2018 1055   Sepsis Labs: _0 (procalcitonin:4,lacticidven:4) ) Recent Results (from the past 240 hour(s))  SARS Coronavirus 2 [facility] order, Performed in [facility] hospital lab) Nasopharyngeal Nasopharyngeal Swab     Status: None   Collection Time: 09/16/2018  6:11 PM   Specimen: Nasopharyngeal Swab  Result Value Ref Range Status   SARS Coronavirus 2 NEGATIVE NEGATIVE Final    Comment: (NOTE) If result is NEGATIVE SARS-CoV-2 target nucleic acids are NOT DETECTED. The SARS-CoV-2 RNA is generally detectable in upper and lower  respiratory specimens during the acute phase of infection. The lowest  concentration of SARS-CoV-2 viral copies this assay can detect is 250  copies / mL. A negative result does not preclude SARS-CoV-2 infection  and should not be used as the sole basis for treatment or other  patient management decisions.  A negative result may occur with  improper specimen collection / handling, submission of specimen other  than nasopharyngeal swab, presence of viral mutation(s) within the  areas targeted by this assay, and inadequate number of viral copies  (<250 copies / mL). A negative result must be combined with clinical  observations, patient history, and epidemiological information. If result is POSITIVE SARS-CoV-2 target nucleic acids are DETECTED. The SARS-CoV-2 RNA is generally detectable in upper and lower  respiratory specimens dur ing the acute phase of infection.  Positive  results are indicative of active infection with SARS-CoV-2.  Clinical  correlation with patient history and other diagnostic information is  necessary to determine patient infection status.  Positive results do  not rule out bacterial infection or co-infection with other viruses. If result is PRESUMPTIVE POSTIVE  SARS-CoV-2 nucleic acids MAY BE PRESENT.   A presumptive positive result was obtained on the submitted specimen  and confirmed on repeat testing.  While 2019 novel coronavirus  (SARS-CoV-2) nucleic acids may be present in the submitted sample  additional confirmatory testing may be necessary for epidemiological  and / or clinical management purposes  to differentiate between  SARS-CoV-2 and other Sarbecovirus currently known to infect humans.  If clinically indicated additional testing with an alternate test  methodology [phone]) is advised. The SARS-CoV-2 RNA is generally  detectable in upper and lower respiratory sp ecimens during the acute  phase of infection. The expected result is Negative. Fact Sheet for Patients:  StrictlyIdeas.no Fact Sheet for Healthcare Providers: BankingDealers.co.za This test is not yet approved or cleared by the [place] FDA and has been authorized for detection and/or diagnosis of SARS-CoV-2 by FDA under an Emergency Use Authorization (EUA).  This EUA will remain in effect (meaning this test can be used) for the duration of the COVID-19 declaration under Section 564(b)(1) of the Act, 21 U.S.C. section 360bbb-3(b)(1), unless the authorization is terminated or revoked sooner. Performed at [facility], [place] [address], [place], [place] [address]       Radiological Exams on Admission: Dg Chest Portable 1 View  Result Date: 09/22/2018 CLINICAL DATA:  58 year old female with weakness. History of metastatic breast cancer. EXAM: PORTABLE CHEST 1 VIEW COMPARISON:  Chest radiograph dated 06/22/2018 and CT dated 06/11/2018 FINDINGS: There is shallow inspiration with bibasilar atelectasis. A 4.4 cm rounded opacity in the right upper lobe corresponds to density seen on the prior CT and may represent metastatic disease or primary lung cancer. Additional smaller 14 mm nodular density noted in the  right upper lobe more laterally. There  is probable small bilateral pleural effusions. No pneumothorax. There is mild cardiomegaly. Atherosclerotic calcification of aortic arch. Right sided Port-A-Cath with tip in the region of the cavoatrial junction. No acute osseous pathology. IMPRESSION: 1. Small bilateral pleural effusions and bibasilar atelectasis or infiltrate. 2. Right upper lobe rounded opacity corresponding to the density seen on the prior CT. An additional 14 mm right upper lobe pulmonary nodule noted. Electronically Signed   By: Anner Crete M.D.   On: 09/10/2018 19:07    EKG: Independently reviewed.   Active Problems:   Acute encephalopathy   Assessment/Plan Acute encephalopathy, likely combined metabolic and toxic: Admit patient to stepdown unit Panculture patient Broad-spectrum antibiotics Monitor blood sugar closely Follow urinalysis Wound care consult Consider consulting oncology in the morning (Dr. Alvy Bimler) Low threshold to consult palliative and hospice care team  Severe anemia: Hemoglobin is 6.6 g/dL  Last hemoglobin checked has ranged from 7 g/dL to 10 g/dL  likely multifactorial Patient has history of chronic anemia Transfuse packed red blood cells. Further management depend on hospital course.  Severe thrombocytopenia: Platelet count is 5 Patient is currently being transfused with 1 unit of platelets Continue to monitor closely Institute appropriate precautions.  Hyponatremia: Suspect secondary to poor p.o. intake/volume depletion Check urine sodium Urine osmolality and serum osmolality TSH and cortisol Gentle hydration  Failure to thrive: Likely related to patient's malignancy. Caloric count Continue supplements.  Hypotension/low normal systolic blood pressure: Etiology is likely multifactorial Monitor closely after gentle hydration Sepsis work-up is in progress Patient has also been transfused with packed red blood cells and platelet Hold  medications that can cause hypotension Further management depend on hospital course.  Metastatic breast cancer: Oncology is managing.  DVT prophylaxis: SCD Code Status: Full code Family Communication:  Disposition Plan: This will depend on hospital course Consults called: Please consult oncology in the morning.  Have a low threshold to consult palliative and hospice care team Admission status: Inpatient  Time spent: 70 minutes  Dana Allan, MD  [facility] Pager #: [phone] 7PM-7AM contact night coverage as above  09/03/2018, 10:14 PM

## 2018-09-07 NOTE — ED Provider Notes (Signed)
[facility] DEPT Provider Note   CSN: [id number] Arrival date & time: 09/26/2018  1738    History   Chief Complaint No chief complaint on file.   HPI Stacy Shelton is a 58 y.o. female.    Level 5 caveat due to altered mental status. HPI Patient brought in for generalized weakness.  Has metastatic breast cancer.  She is diabetic.  Initial CBG found to be 19.  Question of fevers at home.  When asked how high her fever went at home patient states not very high.  When asked for a number she states 440.  When asked 440 what she says CCL. Past Medical History:  Diagnosis Date  . Asthma   . Diabetes ([place])   . Metastatic breast cancer ([place])   . Seasonal allergies     Patient Active Problem List   Diagnosis Date Noted  . Decubitus ulcers 09/01/2018  . Nausea and vomiting 08/13/2018  . Olecranon bursitis, left elbow 07/28/2018  . Tachycardia   . Acute diastolic heart failure ([place])   . Pressure injury of skin 06/13/2018  . Severe anemia 06/11/2018  . Other constipation 05/20/2018  . Metastasis to lung ([place]) 05/20/2018  . Cancer associated pain 05/15/2018  . Physical debility 05/13/2018  . S/P thoracentesis   . SVT (supraventricular tachycardia) ([place])   . Increased oxygen demand   . Tachypnea   . Hypoxia   . Fever 04/24/2018  . Chronic diastolic CHF (congestive heart failure) ([place]) 04/24/2018  . Anemia associated with chemotherapy 04/24/2018  . Chemotherapy-induced thrombocytopenia 04/24/2018  . Acute cystitis without hematuria   . Pneumonia due to infectious organism   . Malnutrition of moderate degree 04/09/2018  . Septic shock ([place])   . Sepsis ([place]) 04/07/2018  . Hypokalemia 04/07/2018  . Hypomagnesemia 04/07/2018  . Acute lower UTI 04/07/2018  . Hypophosphatemia 04/07/2018  . Metastatic breast cancer ([place]) 03/17/2018  . 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 Metastasis to bone ([place]) 03/17/2018  .  Pancytopenia, acquired ([place]) 03/17/2018  . Goals of care, counseling/discussion 03/17/2018  . Encounter for antineoplastic chemotherapy 03/17/2018  . Thrombocytopenia ([place]) 03/11/2018  . Uncontrolled diabetes mellitus with hyperglycemia ([place]) 03/11/2018  . Moderate persistent asthma 03/11/2018    Past Surgical History:  Procedure Laterality Date  . IR IMAGING GUIDED PORT INSERTION  03/24/2018  . KIDNEY STONE SURGERY    . SINOSCOPY    . WISDOM TOOTH EXTRACTION       OB History   No obstetric history on file.      Home Medications    Prior to Admission medications   Medication Sig Start Date End Date Taking? Authorizing Provider  ACCU-CHEK AVIVA PLUS test strip  03/13/18   [provider]  ACCU-CHEK SOFTCLIX LANCETS lancets  03/13/18   [provider]  acetaminophen (TYLENOL) 325 MG tablet Take 2 tablets (650 mg total) by mouth every 6 (six) hours as needed for mild pain (or Fever >/= 101). 06/22/18   Raiford Noble Latif, DO  albuterol (PROAIR HFA) 108 [phone] Base) MCG/ACT inhaler Inhale 2 puffs into the lungs every 6 (six) hours as needed for wheezing or shortness of breath. 05/16/17   Kennith Gain, MD  amiodarone (PACERONE) 200 MG tablet TAKE 1 TABLET BY MOUTH EVERY DAY 09/05/18   [name], MD  benzonatate (TESSALON) 200 MG capsule Take 1 capsule (200 mg total) by mouth 3 (three) times daily as needed for cough (  use second). 06/22/18   Raiford Noble Latif, DO  blood glucose meter kit and supplies KIT Dispense based on patient and insurance preference. Use up to four times daily as directed. (FOR ICD-9 250.00, 250.01). 03/13/18   Donne Hazel, MD  esomeprazole (NEXIUM) 40 MG capsule Take 1 capsule (40 mg total) by mouth daily at 12 noon. 07/11/18   [name], MD  feeding supplement, ENSURE ENLIVE, (ENSURE ENLIVE) LIQD Take 237 mLs by mouth 3 (three) times daily between meals. 06/22/18   Sheikh, Omair Latif, DO  fluticasone (FLOVENT HFA) 44 MCG/ACT inhaler  Inhale 2 puffs into the lungs 2 (two) times daily. 05/16/17   Kennith Gain, MD  insulin aspart (NOVOLOG) 100 UNIT/ML FlexPen Inject 8 Units into the skin 3 (three) times daily with meals. Patient taking differently: Inject 8 Units into the skin 3 (three) times daily with meals. Holds dose if not going to eat. 06/04/18   [name], MD  Insulin Detemir (LEVEMIR) 100 UNIT/ML Pen Inject 10 Units into the skin daily. Patient taking differently: Inject 10 Units into the skin at bedtime.  05/06/18   Dahal, Marlowe Aschoff, MD  Insulin Pen Needle 31G X 5 MM MISC 1 Device by Does not apply route QID. For use with insulin pens 03/13/18   Donne Hazel, MD  lidocaine-prilocaine (EMLA) cream Apply to affected area once Patient taking differently: Apply 1 application topically as needed (port access). Apply to affected area once 03/21/18   [name], MD  LORazepam (ATIVAN) 0.5 MG tablet Take 1 tab po 30 minutes prior to radiation or MRI Patient taking differently: Take 0.5 mg by mouth See admin instructions. Take 30 minutes prior to radiation or MRI 03/14/18   Hayden Pedro, PA-C  metFORMIN (GLUCOPHAGE) 500 MG tablet Take 500 mg by mouth daily with breakfast.     [provider]  metoprolol tartrate (LOPRESSOR) 25 MG tablet Take 0.5 tablets (12.5 mg total) by mouth 2 (two) times daily. 06/22/18   Sheikh, Omair Latif, DO  mirtazapine (REMERON) 30 MG tablet TAKE 1 TABLET (30 MG TOTAL) BY MOUTH AT BEDTIME. 08/19/18   Gorsuch, Ni, MD  montelukast (SINGULAIR) 10 MG tablet Take 1 tablet (10 mg total) by mouth daily. 08/04/18   [name], MD  morphine (MSIR) 15 MG tablet Take 1 tablet (15 mg total) by mouth every 4 (four) hours as needed for severe pain. 09/01/18   [name], MD  Multiple Vitamin (MULTIVITAMIN WITH MINERALS) TABS tablet Take 1 tablet by mouth daily. 05/07/18   Terrilee Croak, MD  ondansetron (ZOFRAN) 8 MG tablet Take 1 tablet (8 mg total) by mouth every 8 (eight) hours as needed for  nausea or vomiting. 08/20/18   [name], MD  prochlorperazine (COMPAZINE) 10 MG tablet Take 10 mg by mouth every 6 (six) hours as needed for nausea or vomiting.     [provider]    Family History Family History  Problem Relation Age of Onset  . Allergic rhinitis Neg Hx   . Angioedema Neg Hx   . Asthma Neg Hx   . Atopy Neg Hx   . Eczema Neg Hx   . Immunodeficiency Neg Hx   . Urticaria Neg Hx     Social History Social History   Tobacco Use  . Smoking status: Never Smoker  . Smokeless tobacco: Never Used  Substance Use Topics  . Alcohol use: Yes    Alcohol/week: 0.0 standard drinks    Comment: socially  .  Drug use: No     Allergies   Nsaids   Review of Systems Review of Systems  Unable to perform ROS: Mental status change     Physical Exam Updated Vital Signs BP (!) 93/45   Pulse (!) 120   Temp (!) 100.5 F (38.1 C) (Rectal)   Resp (!) 25   LMP 10/30/2010   SpO2 99%   Physical Exam Vitals signs and nursing note reviewed.  HENT:     Head: Atraumatic.  Neck:     Musculoskeletal: Neck supple.  Cardiovascular:     Rate and Rhythm: Tachycardia present.  Pulmonary:     Breath sounds: No wheezing or rhonchi.  Abdominal:     Tenderness: There is no abdominal tenderness.  Musculoskeletal:        General: No tenderness.  Skin:    General: Skin is warm.  Neurological:     Mental Status: She is alert.     Comments: Patient is awake, but confused.   Patient has decubitus ulcer on left calf with some erythema.  Also sacral decub with surrounding erythema.  Also right medial buttock/proximal thigh erythema/decub.   ED Treatments / Results  Labs (all labs ordered are listed, but only abnormal results are displayed) Labs Reviewed  CBC WITH DIFFERENTIAL/PLATELET - Abnormal; Notable for the following components:      Result Value   RBC 2.32 (*)    Hemoglobin 6.6 (*)    HCT 21.0 (*)    RDW 15.8 (*)    Platelets 5 (*)    Abs Immature  Granulocytes 0.70 (*)    All other components within normal limits  APTT - Abnormal; Notable for the following components:   aPTT 39 (*)    All other components within normal limits  PROTIME-INR - Abnormal; Notable for the following components:   Prothrombin Time 18.2 (*)    INR 1.5 (*)    All other components within normal limits  COMPREHENSIVE METABOLIC PANEL - Abnormal; Notable for the following components:   Sodium 128 (*)    Chloride 94 (*)    Glucose, Bld 214 (*)    Calcium 7.3 (*)    Total Protein 5.5 (*)    Albumin 1.7 (*)    Alkaline Phosphatase 176 (*)    Total Bilirubin 1.8 (*)    All other components within normal limits  CBG MONITORING, ED - Abnormal; Notable for the following components:   Glucose-Capillary 19 (*)    All other components within normal limits  CBG MONITORING, ED - Abnormal; Notable for the following components:   Glucose-Capillary 103 (*)    All other components within normal limits  CBG MONITORING, ED - Abnormal; Notable for the following components:   Glucose-Capillary 118 (*)    All other components within normal limits  SARS CORONAVIRUS 2 (HOSPITAL ORDER, [facility])  CULTURE, BLOOD (ROUTINE X 2)  CULTURE, BLOOD (ROUTINE X 2)  LACTIC ACID, PLASMA  LACTIC ACID, PLASMA  PREPARE RBC (CROSSMATCH)  PREPARE PLATELET PHERESIS  TYPE AND SCREEN    EKG None  Radiology Dg Chest Portable 1 View  Result Date: 09/26/2018 CLINICAL DATA:  58 year old female with weakness. History of metastatic breast cancer. EXAM: PORTABLE CHEST 1 VIEW COMPARISON:  Chest radiograph dated 06/22/2018 and CT dated 06/11/2018 FINDINGS: There is shallow inspiration with bibasilar atelectasis. A 4.4 cm rounded opacity in the right upper lobe corresponds to density seen on the prior CT and may represent metastatic disease or  primary lung cancer. Additional smaller 14 mm nodular density noted in the right upper lobe more laterally. There is probable  small bilateral pleural effusions. No pneumothorax. There is mild cardiomegaly. Atherosclerotic calcification of aortic arch. Right sided Port-A-Cath with tip in the region of the cavoatrial junction. No acute osseous pathology. IMPRESSION: 1. Small bilateral pleural effusions and bibasilar atelectasis or infiltrate. 2. Right upper lobe rounded opacity corresponding to the density seen on the prior CT. An additional 14 mm right upper lobe pulmonary nodule noted. Electronically Signed   By: Anner Crete M.D.   On: 09/29/2018 19:07    Procedures Procedures (including critical care time)  Medications Ordered in ED Medications  metroNIDAZOLE (FLAGYL) IVPB 500 mg (has no administration in time range)  vancomycin (VANCOCIN) IVPB 1000 mg/200 mL premix (has no administration in time range)  0.9 %  sodium chloride infusion (has no administration in time range)  0.9 %  sodium chloride infusion (has no administration in time range)  HYDROmorphone (DILAUDID) injection 0.5 mg (has no administration in time range)  dextrose 50 % solution ( Intravenous Given 09/02/2018 1757)  sodium chloride 0.9 % bolus 1,000 mL (0 mLs Intravenous Stopped 09/01/2018 1937)  ceFEPIme (MAXIPIME) 2 g in sodium chloride 0.9 % 100 mL IVPB (2 g Intravenous New Bag/Given 09/16/2018 1938)     Initial Impression / Assessment and Plan / ED Course  I have reviewed the triage vital signs and the nursing notes.  Pertinent labs & imaging results that were available during my care of the patient were reviewed by me and considered in my medical decision making (see chart for details).        Patient presented with nausea vomiting and fever.  History of same.  Has metastatic breast cancer.  Not currently on chemotherapy but does have a pancytopenia because of it.  X-ray showed potential pneumonia but I am not convinced.  Had some tachycardia has improved some with fluids.  Briefly hypotensive but this may also be secondary to her anemia.  Will  transfuse red cells and platelets.  Also has decubitus wounds on buttock and sacrum that also could be a source of the fever.  Would be covered by the antibiotics.  Patient has a normal lactic acid.  Will admit to hospitalist.  Patient does overall have a poor prognosis however. CRITICAL CARE Performed by: Davonna Belling Total critical care time 30 minutes Critical care time was exclusive of separately billable procedures and treating other patients. Critical care was necessary to treat or prevent imminent or life-threatening deterioration. Critical care was time spent personally by me on the following activities: development of treatment plan with patient and/or surrogate as well as nursing, discussions with consultants, evaluation of patient's response to treatment, examination of patient, obtaining history from patient or surrogate, ordering and performing treatments and interventions, ordering and review of laboratory studies, ordering and review of radiographic studies, pulse oximetry and re-evaluation of patient's condition.   Final Clinical Impressions(s) / ED Diagnoses   Final diagnoses:  Fever, unspecified fever cause  Anemia, unspecified type  Thrombocytopenia [facility])    ED Discharge Orders    None       Davonna Belling, MD 09/14/2018 2035

## 2018-09-08 ENCOUNTER — Encounter (HOSPITAL_COMMUNITY): Payer: Self-pay

## 2018-09-08 ENCOUNTER — Other Ambulatory Visit: Payer: Self-pay

## 2018-09-08 DIAGNOSIS — D696 Thrombocytopenia, unspecified: Secondary | ICD-10-CM

## 2018-09-08 DIAGNOSIS — R6521 Severe sepsis with septic shock: Secondary | ICD-10-CM

## 2018-09-08 DIAGNOSIS — D649 Anemia, unspecified: Secondary | ICD-10-CM

## 2018-09-08 DIAGNOSIS — N390 Urinary tract infection, site not specified: Secondary | ICD-10-CM

## 2018-09-08 DIAGNOSIS — A419 Sepsis, unspecified organism: Secondary | ICD-10-CM

## 2018-09-08 DIAGNOSIS — E440 Moderate protein-calorie malnutrition: Secondary | ICD-10-CM

## 2018-09-08 DIAGNOSIS — L89892 Pressure ulcer of other site, stage 2: Secondary | ICD-10-CM

## 2018-09-08 DIAGNOSIS — C50919 Malignant neoplasm of unspecified site of unspecified female breast: Secondary | ICD-10-CM

## 2018-09-08 DIAGNOSIS — D61818 Other pancytopenia: Secondary | ICD-10-CM

## 2018-09-08 DIAGNOSIS — J9601 Acute respiratory failure with hypoxia: Secondary | ICD-10-CM

## 2018-09-08 LAB — CBC
HCT: 21 % — ABNORMAL LOW (ref 36.0–46.0)
HCT: 22.6 % — ABNORMAL LOW (ref 36.0–46.0)
Hemoglobin: 6.8 g/dL — CL (ref 12.0–15.0)
Hemoglobin: 7.2 g/dL — ABNORMAL LOW (ref 12.0–15.0)
MCH: 27.9 pg (ref 26.0–34.0)
MCH: 28.6 pg (ref 26.0–34.0)
MCHC: 31.9 g/dL (ref 30.0–36.0)
MCHC: 32.4 g/dL (ref 30.0–36.0)
MCV: 86.1 fL (ref 80.0–100.0)
MCV: 89.7 fL (ref 80.0–100.0)
Platelets: 15 10*3/uL — CL (ref 150–400)
Platelets: 5 10*3/uL — CL (ref 150–400)
RBC: 2.44 MIL/uL — ABNORMAL LOW (ref 3.87–5.11)
RBC: 2.52 MIL/uL — ABNORMAL LOW (ref 3.87–5.11)
RDW: 15.8 % — ABNORMAL HIGH (ref 11.5–15.5)
RDW: 18.7 % — ABNORMAL HIGH (ref 11.5–15.5)
WBC: 3.6 10*3/uL — ABNORMAL LOW (ref 4.0–10.5)
WBC: 3.6 10*3/uL — ABNORMAL LOW (ref 4.0–10.5)
nRBC: 0 % (ref 0.0–0.2)
nRBC: 0 % (ref 0.0–0.2)

## 2018-09-08 LAB — BASIC METABOLIC PANEL
Anion gap: 11 (ref 5–15)
BUN: 11 mg/dL (ref 6–20)
CO2: 24 mmol/L (ref 22–32)
Calcium: 7.8 mg/dL — ABNORMAL LOW (ref 8.9–10.3)
Chloride: 98 mmol/L (ref 98–111)
Creatinine, Ser: 0.48 mg/dL (ref 0.44–1.00)
GFR calc Af Amer: >60 mL/min (ref >60)
GFR calc non Af Amer: >60 mL/min (ref >60)
Glucose, Bld: 118 mg/dL — ABNORMAL HIGH (ref 70–99)
Potassium: 4.2 mmol/L (ref 3.5–5.1)
Sodium: 133 mmol/L — ABNORMAL LOW (ref 135–145)

## 2018-09-08 LAB — URINALYSIS, ROUTINE W REFLEX MICROSCOPIC
Bilirubin Urine: NEGATIVE
Glucose, UA: NEGATIVE mg/dL
Hgb urine dipstick: NEGATIVE
Ketones, ur: NEGATIVE mg/dL
Leukocytes,Ua: NEGATIVE
Nitrite: POSITIVE — AB
Protein, ur: NEGATIVE mg/dL
Specific Gravity, Urine: 1.016 (ref 1.005–1.030)
pH: 6 (ref 5.0–8.0)

## 2018-09-08 LAB — GLUCOSE, CAPILLARY
Glucose-Capillary: 176 mg/dL — ABNORMAL HIGH (ref 70–99)
Glucose-Capillary: 207 mg/dL — ABNORMAL HIGH (ref 70–99)
Glucose-Capillary: 156 mg/dL — ABNORMAL HIGH (ref 70–99)
Glucose-Capillary: 127 mg/dL — ABNORMAL HIGH (ref 70–99)

## 2018-09-08 LAB — HEMOGLOBIN AND HEMATOCRIT, BLOOD
HCT: 20.7 % — ABNORMAL LOW (ref 36.0–46.0)
HCT: 28.6 % — ABNORMAL LOW (ref 36.0–46.0)
Hemoglobin: 6.6 g/dL — CL (ref 12.0–15.0)
Hemoglobin: 9.5 g/dL — ABNORMAL LOW (ref 12.0–15.0)

## 2018-09-08 LAB — PROCALCITONIN: Procalcitonin: 4.28 ng/mL

## 2018-09-08 LAB — CORTISOL: Cortisol, Plasma: 33.6 ug/dL

## 2018-09-08 LAB — PREPARE RBC (CROSSMATCH)

## 2018-09-08 LAB — SODIUM, URINE, RANDOM: Sodium, Ur: 31 mmol/L

## 2018-09-08 LAB — OSMOLALITY: Osmolality: 277 mOsm/kg (ref 275–295)

## 2018-09-08 LAB — PHOSPHORUS: Phosphorus: 3.6 mg/dL (ref 2.5–4.6)

## 2018-09-08 LAB — MAGNESIUM: Magnesium: 1.7 mg/dL (ref 1.7–2.4)

## 2018-09-08 LAB — TSH: TSH: 2.407 u[IU]/mL (ref 0.350–4.500)

## 2018-09-08 LAB — MRSA PCR SCREENING: MRSA by PCR: NEGATIVE

## 2018-09-08 MED ORDER — SODIUM CHLORIDE 0.9 % IV BOLUS
250.0000 mL | Freq: Once | INTRAVENOUS | Status: AC
  Administered 2018-09-08: 250 mL via INTRAVENOUS

## 2018-09-08 MED ORDER — ACETAMINOPHEN 325 MG PO TABS: 650.0000 mg | ORAL_TABLET | Freq: Once | ORAL | Status: DC

## 2018-09-08 MED ORDER — ACETAMINOPHEN 325 MG PO TABS
650.0000 mg | ORAL_TABLET | Freq: Four times a day (QID) | ORAL | Status: DC | PRN
  Administered 2018-09-08 – 2018-09-18 (×5): 650 mg via ORAL
  Filled 2018-09-08 (×6): qty 2

## 2018-09-08 MED ORDER — SODIUM CHLORIDE 0.9 % IV BOLUS
1000.0000 mL | Freq: Once | INTRAVENOUS | Status: AC
  Administered 2018-09-08: 1000 mL via INTRAVENOUS

## 2018-09-08 MED ORDER — SODIUM CHLORIDE 0.9 % IV SOLN
2.0000 g | Freq: Three times a day (TID) | INTRAVENOUS | Status: DC
  Administered 2018-09-08 – 2018-09-10 (×7): 2 g via INTRAVENOUS
  Filled 2018-09-08 (×9): qty 2

## 2018-09-08 MED ORDER — VANCOMYCIN HCL IN DEXTROSE 1-5 GM/200ML-% IV SOLN
1000.0000 mg | Freq: Every day | INTRAVENOUS | Status: DC
  Administered 2018-09-09 – 2018-09-10 (×2): 1000 mg via INTRAVENOUS
  Filled 2018-09-08 (×2): qty 200

## 2018-09-08 MED ORDER — NOREPINEPHRINE 4 MG/250ML-% IV SOLN
0.0000 ug/min | INTRAVENOUS | Status: DC
  Administered 2018-09-08: 2 ug/min via INTRAVENOUS
  Filled 2018-09-08 (×2): qty 250

## 2018-09-08 MED ORDER — SODIUM CHLORIDE 0.9% IV SOLUTION
Freq: Once | INTRAVENOUS | Status: AC
  Administered 2018-09-08: 15:00:00 via INTRAVENOUS

## 2018-09-08 MED ORDER — DIPHENHYDRAMINE HCL 25 MG PO CAPS
25.0000 mg | ORAL_CAPSULE | Freq: Once | ORAL | Status: AC
  Administered 2018-09-08: 25 mg via ORAL
  Filled 2018-09-08: qty 1

## 2018-09-08 NOTE — Progress Notes (Signed)
Stacy Shelton   DOB:04-Jan-1961   XN#:[id number]    ASSESSMENT & PLAN:  Metastatic breast cancer (Stacy Shelton) She has significant complication from each cycle of treatment despite dose adjustment She is slowly recovering from side effects of therapy She is not ready to resume chemotherapy Continue aggressive supportive care now  Acquired pancytopenia I will order 2 more units of blood No need platelets transfusion today  Insulin dependent diabetes Continue insulin sliding scale  Decubitus ulcers She has significant pain secondary to multiple new decubitus ulcer Wound care is consulted  Malnutrition of moderate degree She has significant malnutrition We have extensive discussion about taking Remeron and nutritional supplement as tolerated  Physical debility, critical myopathy She has not participated with physical therapy for over 3 weeks Will consult PT & OT  Depression She is on Remeron. I suggested Psychiatry consult. Patient declined Her sister thinks the patient is incompetent.   Code Status FULL  Goals of care I had numerous goals of care discussion with the patient and family  Discharge planning Consulted social worker for rehab and SNF I spent more than 1 hour with patient and Stacy Shelton   All questions were answered. The patient knows to call the clinic with any problems, questions or concerns.   Stacy Lark, MD 09/08/2018 8:29 AM  Subjective:   Oncology History Overview Note  Biopsy is ER negative Her2/neu positive   Metastatic breast cancer ([place])  03/11/2018 Imaging   Ct scan of chest, abdomen and pelvis 1. 2.6 x 2.3 cm spiculated mass identified inferomedial quadrant of the left breast. Lesion appears to retract the overlying skin. Mammographic correlation recommended. 2. Small lymph nodes in the left axillar ill-defined and suspicious for metastatic disease. 3. Multiple bilateral pulmonary nodules measuring up to 2.7 cm diameter. These probably represent metastatic  disease. Given the dominant size of the central right upper lobe lesion, synchronous lung primary can not be completely excluded. 4. Multiple ill-defined liver lesions compatible with metastatic disease. Dominant liver metastases measure up to almost 4 cm. 5.  Aortic Atherosclerois (ICD10-170.0)    03/12/2018 Pathology Results   Liver, needle/core biopsy, left lobe - METASTATIC CARCINOMA. - LYMPHOVASCULAR INVASION IS IDENTIFIED. - SEE COMMENT. Microscopic Comment The tumor cells are positive for cytokeratin 7. There is faint staining for GATA-3. There is non-specific staining for TTF-1. Cytokeratin 20, CDX-2, estrogen receptor, and GCDFP stains are negative. Given the clinical suspicion, the profile supports a primary breast carcinoma. Her2 will be performed and the results reported separately.  By immunohistochemistry, the tumor cells are POSITIVE for Her2 (3+).   03/12/2018 Imaging   Single 1.3 x 1.4 cm RIGHT occipital metastasis.  Borderline pachymeningeal enhancement of symmetric nature could be related to the superficial metastasis or osseous disease.   03/12/2018 Procedure   Ultrasound-guided core biopsy performed of a mass within the lateral segment of the left lobe of the liver.   03/16/2018 Imaging   Right occipital lobe 13 x 16 mm. Small satellite enhancing nodule deep to the larger lesion. The larger lesion shows central necrosis and probable mild hemorrhage or calcification.  Mild dural enhancement is less impressive and could be within normal limits.  Bone marrow in the clivus and cervical spine is diffusely low signal. No focal lesion. This may be related to the patient's anemia and abnormal blood count.   03/17/2018 Cancer Staging   Staging form: Breast, AJCC 8th Edition - Clinical: Stage IV (cT2, cN0, pM1, GX, ER-, PR: Not Assessed, HER2+) - Signed by Stacy Shelton,  Stacy Asmus, MD on 03/17/2018   03/19/2018 -  Chemotherapy   She received chemo with Taxotere, Herceptin and Perjeta    03/21/2018 Echocardiogram   IMPRESSIONS 1. The left ventricle has normal systolic function with an ejection fraction of 60-65%. The cavity size was normal. Left ventricular diastolic Doppler parameters are consistent with impaired relaxation.  2. The right ventricle has normal systolic function. The cavity was normal. There is no increase in right ventricular wall thickness.  3. The mitral valve is normal in structure.  4. The tricuspid valve is normal in structure.  5. Strain imaging performed but not reported due to interpreter judgement, secondary to suboptimal image quality.   03/24/2018 Procedure   Successful placement of a right IJ approach Power Port with ultrasound and fluoroscopic guidance. The catheter is ready for use.   04/07/2018 - 04/16/2018 Hospital Admission   She was admitted for pneumonia   04/07/2018 - 04/16/2018 Hospital Admission   She was admitted to the hospital for pneumonia and respiratory failure   04/25/2018 Imaging   Retropharyngeal fluid collection compatible with effusion or possibly abscess. Soft tissue thickening extends into the right neck and surrounds the right carotid bifurcation and right internal carotid artery. There is also streaky density in the right lateral neck soft tissues with a focal 7.6 Mm lymph node in the right lateral neck. Stranding surrounds the right submandibular gland.   Findings are most compatible with infection. Favor pharyngitis with retropharyngeal effusion/abscess. Soft tissue thickening around the right carotid most compatible with infection rather than tumor. Right jugular vein is patent.  Multiple nodular densities in the right upper lobe may represent residual pneumonia or metastatic disease. Interval improvement in right upper lobe infiltrate compared with CT of 04/08/2018   04/25/2018 - 05/06/2018 Hospital Admission   She was admitted for retropharyngeal abscess   04/27/2018 Imaging   No evidence of large central pulmonary  embolus is noted in the main pulmonary artery or the main portions of the right and left pulmonary arteries. However, evaluation of the lower lobe branches, particularly on the right, is limited due to respiratory motion artifact and other limiting issues. Pulmonary emboli and smaller peripheral branches can not be excluded on the basis of this exam.  Large left pleural effusion is noted with complete atelectasis of the left lower lobe. Increased left upper lobe opacity is noted posteriorly concerning for atelectasis or pneumonia.  Improved right upper lobe and lower lobe opacities are noted suggesting improving pneumonia or atelectasis.  15 mm right paratracheal lymph node is noted which is enlarged compared to prior exam; it is uncertain if this is metastatic or inflammatory in etiology.  Hepatic metastatic lesions are again noted.  Aortic Atherosclerosis (ICD10-I70.0).    04/27/2018 Procedure   Successful ultrasound guided left thoracentesis yielding 120 mL of blood-tinged of pleural fluid.   06/11/2018 Imaging   1. No evidence of pulmonary emboli. 2. Extensive bilateral lower lobe consolidation. 3. Small pleural effusions. 4. 2.5 cm right upper lobe nodule, enlarged from 04/27/2018 and which may reflect a metastasis or primary lung cancer. Stable to mild enlargement of subcentimeter nodules in both upper lobes consistent with known metastases. 5. Decreased size of liver metastases. 6. Unchanged mild mediastinal and right hilar lymphadenopathy. 7. Aortic Atherosclerosis (ICD10-I70.0).   06/11/2018 - 06/22/2018 Hospital Admission   She was hospitalized for infection and SVT   07/18/2018 Imaging   MRI brain 1. Regression of solitary right occipital lobe metastasis and edema. No new brain metastasis. 2. New  ill-defined enhancement at the atlantooccipital membrane, presumably a metastatic focus. The adjacent bone marrow is abnormally low signal but not enhancing to the degree of the  extraosseous lesion. Marrow signal is likely from anemia.     Metastasis to brain [facility])  03/17/2018 Initial Diagnosis   Metastasis to brain [facility])   Metastasis to liver ([place])  03/17/2018 Initial Diagnosis   Metastasis to liver [facility])   Metastasis to bone ([place])  03/17/2018 Initial Diagnosis   Metastasis to bone [facility])      Objective:  Vitals:   09/08/18 0700 09/08/18 0730  BP: (!) 100/35   Pulse: 95   Resp: (!) 27   Temp:  99.4 F (37.4 C)  SpO2: 100%      Intake/Output Summary (Last 24 hours) at 09/08/2018 0829 Last data filed at 09/08/2018 0305 Gross per 24 hour  Intake 509 ml  Output -  Net 509 ml    GENERAL:alert, no distress and comfortable SKIN: skin color, texture, turgor are normal, no rashes or significant lesions EYES: normal, Conjunctiva are pink and non-injected, sclera clear OROPHARYNX:no exudate, no erythema and lips, buccal mucosa, and tongue normal  NECK: supple, thyroid normal size, non-tender, without nodularity LYMPH:  no palpable lymphadenopathy in the cervical, axillary or inguinal LUNGS: clear to auscultation and percussion with normal breathing effort HEART: regular rate & rhythm and no murmurs and no lower extremity edema ABDOMEN:abdomen soft, non-tender and normal bowel sounds Musculoskeletal:no cyanosis of digits and no clubbing  NEURO: alert & oriented x 3 with fluent speech, she has severe critical myeopathy   Labs:  Recent Labs    05/06/18 0430  08/29/18 0840 09/01/18 0845 09/11/2018 1858 09/08/18 0031  NA 138   < > 136 136 128* 133*  K 3.6   < > 3.9 4.0 3.5 4.2  CL 99   < > 95* 97* 94* 98  CO2 32   < > 31 29 24 24   GLUCOSE 138*   < > 91 178* 214* 118*  BUN 14   < > 14 16 12 11   CREATININE 0.54   < > 0.63 0.55 0.49 0.48  CALCIUM 7.8*   < > 8.4* 8.5* 7.3* 7.8*  GFRNONAA >60   < > >60 >60 >60 >60  GFRAA >60   < > >60 >60 >60 >60  PROT 5.4*   < > 6.1* 5.9* 5.5*  --   ALBUMIN 1.7*   < > 1.5* 1.6* 1.7*  --   AST 23   < > 7* 11* 23  --    ALT 14   < > 6 9 16   --   ALKPHOS 372*   < > 156* 182* 176*  --   BILITOT 2.0*   < > 1.1 1.7* 1.8*  --   BILIDIR 1.0*  --   --   --   --   --   IBILI 1.0*  --   --   --   --   --    < > = values in this interval not displayed.    Studies:  Dg Chest Portable 1 View  Result Date: 09/13/2018 CLINICAL DATA:  58 year old female with weakness. History of metastatic breast cancer. EXAM: PORTABLE CHEST 1 VIEW COMPARISON:  Chest radiograph dated 06/22/2018 and CT dated 06/11/2018 FINDINGS: There is shallow inspiration with bibasilar atelectasis. A 4.4 cm rounded opacity in the right upper lobe corresponds to density seen on the prior CT and may represent metastatic disease or primary lung  cancer. Additional smaller 14 mm nodular density noted in the right upper lobe more laterally. There is probable small bilateral pleural effusions. No pneumothorax. There is mild cardiomegaly. Atherosclerotic calcification of aortic arch. Right sided Port-A-Cath with tip in the region of the cavoatrial junction. No acute osseous pathology. IMPRESSION: 1. Small bilateral pleural effusions and bibasilar atelectasis or infiltrate. 2. Right upper lobe rounded opacity corresponding to the density seen on the prior CT. An additional 14 mm right upper lobe pulmonary nodule noted. Electronically Signed   By: Anner Crete M.D.   On: 09/06/2018 19:07

## 2018-09-08 NOTE — Progress Notes (Signed)
Pt bp remains low.  Map 40s.  [place] NP notified.  NS bolus ordered and given.

## 2018-09-08 NOTE — Consult Note (Addendum)
[place] Nurse wound consult note Reason for Consult:Full thickness pressure injuries in the setting of severe protein malnutrition. Metastatic breask cancer.   Albumin 1.7.  Hemoglobin is 6.6 today.  HAs deep tissue injury to left calf Unstageable pressure injury to sacrum Right buttocks and posterior thigh with moisture and stage 3 pressure skin injury. Impaired ability to heal. Will implement moist wound healing and turn and reposition every two hours.   Wound type:pressure and moisture in the setting of malignancy and malnutrition.  Pressure Injury POA: Yes Measurement: sacrum:  4 cm x 4 cm slough to wound bed.  Left calf:  2 cmx 1.5 cm maroon discoloration Right buttocks/posterior thigh:  2 cmx 3 cm x 0.2 cm  Pink moist wound bed Wound bed: see above Drainage (amount, consistency, odor) minimal serosanguinous  Periwound:intact Dressing procedure/placement/frequency: Cleanse wounds to sacrum and right buttocks with ns and pat dry.  Apply NS moist Aquacel Ag to wound bed.  COver with silicone foam.  Change M/W/F.  Offload pressure to left calf.   Mattress with low air loss feature.  Will not follow at this time.  Please re-consult if needed.  Domenic Moras MSN, RN, FNP-BC CWON Wound, Ostomy, Continence Nurse Pager [phone]

## 2018-09-08 NOTE — Progress Notes (Signed)
CRITICAL VALUE ALERT  Critical Value:  Hemoglobin 6.6  Date & Time Notied:  726am 09/08/2018  Provider Notified: Dr. Darrick Meigs  Orders Received/Actions taken: Repeat hemoglobin due to concerns of hemodilution since 1L bolus was given prior to lab draw.  Repeat hemoglobin resulted as 6.8. MD made aware. Awaiting further orders. Will continue to monitor.

## 2018-09-08 NOTE — Progress Notes (Signed)
Pt c/o back pain 8/10. PRN Tylenol not effective. BP 93/27 (50). Notified Dr. Darrick Meigs and received orders to infuse a 273mL NS bolus and then go ahead and give the dose of prn morphine, 15mg . Will continue to assess pt response.

## 2018-09-08 NOTE — Progress Notes (Signed)
Patient's BP has remained low throughout the day, sustaining around 80/30. Last BP 96/30 (50) after receiving one of two ordered units of PRBCs. Dr. Darrick Meigs was paged at 1624 and put in an order for levo to maintain a map >65 post-transfusion. Will continue to monitor and assess.

## 2018-09-08 NOTE — Progress Notes (Signed)
[facility]  PROGRESS NOTE  Stacy Shelton KDX:[id number] DOB: 22-Oct-1960 DOA: 09/05/2018 PCP: Robyne Peers, MD   Brief HPI:   58 year old Caucasian female with past medical history of metastatic breast cancer, diabetes mellitus type 2, asthma was brought to hospital after altered mental status.  Found to be hypoglycemic with blood glucose 19.  She was started on broad-spectrum antibiotic for infectious etiology.  Also patient found to be severely anemic and thrombocytopenic.  Transfused  PRBC and platelets.    Subjective   Patient seen and examined, complains of pain in her arms.   Assessment/Plan:    1. Acute encephalopathy-improved, likely back to baseline.  Blood glucose has improved.  UA shows 21-50 WBCs, positive nitrite. Urine culture is pending.  Continue broad-spectrum antibiotics vancomycin and cefepime.  2. UTI-patient has a normal UA, started on IV antibiotics as above.  Will follow urine culture results.  3. 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  Patient presented with hemoglobin of 6.6, status post 1 unit PRBC.  This morning hemoglobin is 6.8.  She also received 1 unit of platelets, platelet count went up from 5000-15,000 this morning.  Dr. Alvy Bimler will see patient and make further recommendations.  4. Diabetes mellitus type 2-blood glucose is stable this morning.  She presented with hypoglycemia with blood glucose of 19.  Continue sliding scale insulin with NovoLog.  5. Decubitus ulcers-patient is decubitus ulcers on buttocks, wound care has been consulted.  Will follow recommendations.  6. 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  Continue metoprolol, amiodarone.  7. Hyponatremia-sodium is 128 at the time of admission, improved 133 with IV normal saline.  Urine sodium was 31, serum osmolality is 277, ? SIADH. Follow serum sodium in am.  8. Severe protein calorie malnutrition-serum  albumin is 1.7, secondary to metastatic breast cancer.  Poor p.o. intake.  Will obtain dietitian consult.  9. Metastatic breast cancer-patient is off chemotherapy due to significant side effects.  Dr. Alvy Bimler is going to discuss goals of care with patient, continue supportive care at this time.  Continue Morphine 15 mg every 4 hours as needed     CBG: Recent Labs  Lab 09/28/2018 1752 09/24/2018 1807 09/05/2018 1945 09/06/2018 2345 09/08/18 0732  GLUCAP 19* 103* 118* 116* 176*    CBC: Recent Labs  Lab 08/31/2018 1805 09/08/18 0031 09/08/18 0652 09/08/18 0806  WBC 5.8 3.6*  --  3.6*  NEUTROABS 3.6  --   --   --   HGB 6.6* 7.2* 6.6* 6.8*  HCT 21.0* 22.6* 20.7* 21.0*  MCV 90.5 89.7  --  86.1  PLT 5* 5*  --  15*    Basic Metabolic Panel: Recent Labs  Lab 09/01/2018 1858 09/08/18 0031  NA 128* 133*  K 3.5 4.2  CL 94* 98  CO2 24 24  GLUCOSE 214* 118*  BUN 12 11  CREATININE 0.49 0.48  CALCIUM 7.3* 7.8*  MG  --  1.7  PHOS  --  3.6     DVT prophylaxis: SCDs  Code Status: Full code  Family Communication: No family at bedside  Disposition Plan: likely home when medically ready for discharge   Scheduled medications:  . amiodarone  200 mg Oral Daily  . budesonide (PULMICORT) nebulizer solution  0.25 mg Nebulization BID  . Chlorhexidine Gluconate Cloth  6 each Topical Daily  . feeding supplement (ENSURE ENLIVE)  237 mL Oral TID BM  . insulin aspart  0-5 Units Subcutaneous QHS  .  insulin aspart  0-9 Units Subcutaneous TID WC  . mouth rinse  15 mL Mouth Rinse BID  . montelukast  10 mg Oral QHS  . multivitamin with minerals  1 tablet Oral Daily  . pantoprazole  40 mg Oral Daily  . sodium chloride flush  10-40 mL Intracatheter Q12H    Consultants:  Oncology  Procedures:     Antibiotics:   Anti-infectives (From admission, onward)   Start     Dose/Rate Route Frequency Ordered Stop   09/09/18 0300  vancomycin (VANCOCIN) IVPB 1000 mg/200 mL premix     1,000  mg 200 mL/hr over 60 Minutes Intravenous Daily 09/08/18 0323     09/08/18 0600  ceFEPIme (MAXIPIME) 2 g in sodium chloride 0.9 % 100 mL IVPB     2 g 200 mL/hr over 30 Minutes Intravenous Every 8 hours 09/08/18 0323     09/27/2018 1900  ceFEPIme (MAXIPIME) 2 g in sodium chloride 0.9 % 100 mL IVPB     2 g 200 mL/hr over 30 Minutes Intravenous  Once 09/12/2018 1858 09/11/2018 2120   09/19/2018 1900  metroNIDAZOLE (FLAGYL) IVPB 500 mg     500 mg 100 mL/hr over 60 Minutes Intravenous  Once 09/20/2018 1858 08/31/2018 2228   09/11/2018 1900  vancomycin (VANCOCIN) IVPB 1000 mg/200 mL premix     1,000 mg 200 mL/hr over 60 Minutes Intravenous  Once 09/10/2018 1858 09/08/18 0221       Objective   Vitals:   09/08/18 0800 09/08/18 0853 09/08/18 0900 09/08/18 1000  BP: (!) 113/41  (!) 122/58 (!) 102/51  Pulse: (!) 104  (!) 103 (!) 101  Resp: (!) 31  (!) 33 (!) 32  Temp:      TempSrc:      SpO2: 100% 99% 99% 100%  Weight:      Height:        Intake/Output Summary (Last 24 hours) at 09/08/2018 1053 Last data filed at 09/08/2018 7741 Gross per 24 hour  Intake 1387.07 ml  Output -  Net 1387.07 ml   Filed Weights   09/08/18 0000  Weight: 57.4 kg     Physical Examination:    General: Appears in no acute distress  Cardiovascular: S1-S2, regular, no murmur auscultated  Respiratory: Clear to auscultation bilaterally  Abdomen: Abdomen is soft, nontender, no organomegaly  Extremities: No edema in the lower extremities  Neurologic: Alert, oriented x3, intact insight and judgment, no focal deficit noted     Data Reviewed: I have personally reviewed following labs and imaging studies   Recent Results (from the past 240 hour(s))  SARS Coronavirus 2 [facility] order, Performed in [place] hospital lab) Nasopharyngeal Nasopharyngeal Swab     Status: None   Collection Time: 09/14/2018  6:11 PM   Specimen: Nasopharyngeal Swab  Result Value Ref Range Status   SARS Coronavirus 2 NEGATIVE NEGATIVE  Final    Comment: (NOTE) If result is NEGATIVE SARS-CoV-2 target nucleic acids are NOT DETECTED. The SARS-CoV-2 RNA is generally detectable in upper and lower  respiratory specimens during the acute phase of infection. The lowest  concentration of SARS-CoV-2 viral copies this assay can detect is 250  copies / mL. A negative result does not preclude SARS-CoV-2 infection  and should not be used as the sole basis for treatment or other  patient management decisions.  A negative result may occur with  improper specimen collection / handling, submission of specimen other  than nasopharyngeal swab, presence of viral mutation(s) within  the  areas targeted by this assay, and inadequate number of viral copies  (<250 copies / mL). A negative result must be combined with clinical  observations, patient history, and epidemiological information. If result is POSITIVE SARS-CoV-2 target nucleic acids are DETECTED. The SARS-CoV-2 RNA is generally detectable in upper and lower  respiratory specimens dur ing the acute phase of infection.  Positive  results are indicative of active infection with SARS-CoV-2.  Clinical  correlation with patient history and other diagnostic information is  necessary to determine patient infection status.  Positive results do  not rule out bacterial infection or co-infection with other viruses. If result is PRESUMPTIVE POSTIVE SARS-CoV-2 nucleic acids MAY BE PRESENT.   A presumptive positive result was obtained on the submitted specimen  and confirmed on repeat testing.  While 2019 novel coronavirus  (SARS-CoV-2) nucleic acids may be present in the submitted sample  additional confirmatory testing may be necessary for epidemiological  and / or clinical management purposes  to differentiate between  SARS-CoV-2 and other Sarbecovirus currently known to infect humans.  If clinically indicated additional testing with an alternate test  methodology [phone]) is advised. The  SARS-CoV-2 RNA is generally  detectable in upper and lower respiratory sp ecimens during the acute  phase of infection. The expected result is Negative. Fact Sheet for Patients:  StrictlyIdeas.no Fact Sheet for Healthcare Providers: BankingDealers.co.za This test is not yet approved or cleared by the [place] FDA and has been authorized for detection and/or diagnosis of SARS-CoV-2 by FDA under an Emergency Use Authorization (EUA).  This EUA will remain in effect (meaning this test can be used) for the duration of the COVID-19 declaration under Section 564(b)(1) of the Act, 21 U.S.C. section 360bbb-3(b)(1), unless the authorization is terminated or revoked sooner. Performed at [facility], [place] [address], [place], [place] [address]   MRSA PCR Screening     Status: None   Collection Time: 09/28/2018 11:25 PM   Specimen: Nasal Mucosa; Nasopharyngeal  Result Value Ref Range Status   MRSA by PCR NEGATIVE NEGATIVE Final    Comment:        The GeneXpert MRSA Assay (FDA approved for NASAL specimens only), is one component of a comprehensive MRSA colonization surveillance program. It is not intended to diagnose MRSA infection nor to guide or monitor treatment for MRSA infections. Performed at [facility], [place] [address] [place], [place] [address]   Culture, blood (routine x 2)     Status: None (Preliminary result)   Collection Time: 09/08/18 12:31 AM   Specimen: BLOOD LEFT HAND  Result Value Ref Range Status   Specimen Description   Final    BLOOD LEFT HAND Performed at [facility], [place] [address] [place], [place] [address]    Special Requests   Final    BOTTLES DRAWN AEROBIC ONLY Blood Culture results may not be optimal due to an inadequate volume of blood received in culture bottles Performed at [place] [address] [place], [place] [address]    Culture  PENDING  Incomplete   Report Status PENDING  Incomplete     Liver Function Tests: Recent Labs  Lab 09/11/2018 1858  AST 23  ALT 16  ALKPHOS 176*  BILITOT 1.8*  PROT 5.5*  ALBUMIN 1.7*   No results for input(s): LIPASE, AMYLASE in the last 168 hours. No results for input(s): AMMONIA in the last 168 hours.  Cardiac Enzymes: No results for input(s):  CKTOTAL, CKMB, CKMBINDEX, TROPONINI in the last 168 hours. BNP (last 3 results) Recent Labs    06/11/18 2030  BNP 809.0*    ProBNP (last 3 results) No results for input(s): PROBNP in the last 8760 hours.    Studies: Dg Chest Portable 1 View  Result Date: 09/23/2018 CLINICAL DATA:  58 year old female with weakness. History of metastatic breast cancer. EXAM: PORTABLE CHEST 1 VIEW COMPARISON:  Chest radiograph dated 06/22/2018 and CT dated 06/11/2018 FINDINGS: There is shallow inspiration with bibasilar atelectasis. A 4.4 cm rounded opacity in the right upper lobe corresponds to density seen on the prior CT and may represent metastatic disease or primary lung cancer. Additional smaller 14 mm nodular density noted in the right upper lobe more laterally. There is probable small bilateral pleural effusions. No pneumothorax. There is mild cardiomegaly. Atherosclerotic calcification of aortic arch. Right sided Port-A-Cath with tip in the region of the cavoatrial junction. No acute osseous pathology. IMPRESSION: 1. Small bilateral pleural effusions and bibasilar atelectasis or infiltrate. 2. Right upper lobe rounded opacity corresponding to the density seen on the prior CT. An additional 14 mm right upper lobe pulmonary nodule noted. Electronically Signed   By: Anner Crete M.D.   On: 09/08/2018 19:07     Admission status: Inpatient: Based on patients clinical presentation and evaluation of above clinical data, I have made determination that patient meets Inpatient criteria at this time.  Time spent: 20 min  [facility] Pager [phone]. If 7PM-7AM, please contact night-coverage at [email or web address], Office  [phone]  password [email or web address]  09/08/2018, 10:53 AM  LOS: 1 day

## 2018-09-09 LAB — PREPARE PLATELET PHERESIS: Unit division: 0

## 2018-09-09 LAB — COMPREHENSIVE METABOLIC PANEL
ALT: 14 U/L (ref 0–44)
AST: 13 U/L — ABNORMAL LOW (ref 15–41)
Albumin: 1.6 g/dL — ABNORMAL LOW (ref 3.5–5.0)
Alkaline Phosphatase: 183 U/L — ABNORMAL HIGH (ref 38–126)
Anion gap: 8 (ref 5–15)
BUN: 12 mg/dL (ref 6–20)
CO2: 24 mmol/L (ref 22–32)
Calcium: 7.3 mg/dL — ABNORMAL LOW (ref 8.9–10.3)
Chloride: 98 mmol/L (ref 98–111)
Creatinine, Ser: 0.43 mg/dL — ABNORMAL LOW (ref 0.44–1.00)
GFR calc Af Amer: >60 mL/min (ref >60)
GFR calc non Af Amer: >60 mL/min (ref >60)
Glucose, Bld: 250 mg/dL — ABNORMAL HIGH (ref 70–99)
Potassium: 3 mmol/L — ABNORMAL LOW (ref 3.5–5.1)
Sodium: 130 mmol/L — ABNORMAL LOW (ref 135–145)
Total Bilirubin: 2.2 mg/dL — ABNORMAL HIGH (ref 0.3–1.2)
Total Protein: 5.3 g/dL — ABNORMAL LOW (ref 6.5–8.1)

## 2018-09-09 LAB — TYPE AND SCREEN
ABO/RH(D): A POS
Antibody Screen: NEGATIVE
Unit division: 0
Unit division: 0
Unit division: 0

## 2018-09-09 LAB — BPAM PLATELET PHERESIS
Blood Product Expiration Date: 202008112359
ISSUE DATE / TIME: 202008100042
Unit Type and Rh: 5100

## 2018-09-09 LAB — BPAM RBC
Blood Product Expiration Date: 202008272359
Blood Product Expiration Date: 202008272359
Blood Product Expiration Date: 202008272359
ISSUE DATE / TIME: 202008100158
ISSUE DATE / TIME: 202008101524
ISSUE DATE / TIME: 202008101804
Unit Type and Rh: 6200
Unit Type and Rh: 6200
Unit Type and Rh: 6200

## 2018-09-09 LAB — CBC
HCT: 28.9 % — ABNORMAL LOW (ref 36.0–46.0)
Hemoglobin: 9.4 g/dL — ABNORMAL LOW (ref 12.0–15.0)
MCH: 28.1 pg (ref 26.0–34.0)
MCHC: 32.5 g/dL (ref 30.0–36.0)
MCV: 86.5 fL (ref 80.0–100.0)
Platelets: 12 10*3/uL — CL (ref 150–400)
RBC: 3.34 MIL/uL — ABNORMAL LOW (ref 3.87–5.11)
RDW: 17.1 % — ABNORMAL HIGH (ref 11.5–15.5)
WBC: 5.3 10*3/uL (ref 4.0–10.5)
nRBC: 0 % (ref 0.0–0.2)

## 2018-09-09 LAB — GLUCOSE, CAPILLARY
Glucose-Capillary: 201 mg/dL — ABNORMAL HIGH (ref 70–99)
Glucose-Capillary: 129 mg/dL — ABNORMAL HIGH (ref 70–99)
Glucose-Capillary: 151 mg/dL — ABNORMAL HIGH (ref 70–99)
Glucose-Capillary: 183 mg/dL — ABNORMAL HIGH (ref 70–99)

## 2018-09-09 MED ORDER — ENSURE ENLIVE PO LIQD: 237.0000 mL | Freq: Two times a day (BID) | ORAL | Status: DC

## 2018-09-09 MED ORDER — JUVEN PO PACK
1.0000 | PACK | Freq: Two times a day (BID) | ORAL | Status: DC
  Administered 2018-09-11 – 2018-09-14 (×5): 1 via ORAL
  Filled 2018-09-09 (×13): qty 1

## 2018-09-09 MED ORDER — POTASSIUM CHLORIDE 10 MEQ/100ML IV SOLN
10.0000 meq | INTRAVENOUS | Status: AC
  Administered 2018-09-09 (×3): 10 meq via INTRAVENOUS
  Filled 2018-09-09 (×3): qty 100

## 2018-09-09 MED ORDER — GLUCERNA SHAKE PO LIQD
237.0000 mL | ORAL | Status: DC
  Administered 2018-09-09 – 2018-09-20 (×29): 237 mL via ORAL
  Filled 2018-09-09 (×75): qty 237

## 2018-09-09 NOTE — TOC Initial Note (Signed)
Transition of Care [facility]) - Initial/Assessment Note    Patient Details  Name: Stacy Shelton MRN: [id number] Date of Birth: 17-Apr-1960  Transition of Care [facility]) CM/SW Contact:    Kymberlee Viger, Marjie Skiff, RN Phone Number: 09/09/2018, 11:13 AM  Clinical Narrative:                 Pt from home with family and [facility] for [facility] health services (PT/OT/RN). Will need home health orders at dc.  Expected Discharge Plan: [place] Barriers to Discharge: Continued Medical Work up    Expected Discharge Plan and Services Expected Discharge Plan: [place]   Discharge Planning Services: CM Consult   Living arrangements for the past 2 months: Single Family Home                  Prior Living Arrangements/Services Living arrangements for the past 2 months: Single Family Home Lives with:: Relatives              Current home services: Home OT, Home PT, Home RN    Activities of Daily Living Home Assistive Devices/Equipment: Bathtub lift, Oxygen, Shower chair with back, Raised toilet seat with rails ADL Screening (condition at time of admission) Patient's cognitive ability adequate to safely complete daily activities?: Yes Is the patient deaf or have difficulty hearing?: No Does the patient have difficulty seeing, even when wearing glasses/contacts?: No Does the patient have difficulty concentrating, remembering, or making decisions?: No Patient able to express need for assistance with ADLs?: Yes Does the patient have difficulty dressing or bathing?: No Independently performs ADLs?: No Communication: Independent Dressing (OT): Needs assistance Is this a change from baseline?: Pre-admission baseline Does the patient have difficulty walking or climbing stairs?: Yes Weakness of Legs: Both Weakness of Arms/Hands: Both  Admission diagnosis:  Thrombocytopenia ([place]) [D69.6] Fever, unspecified fever cause [R50.9] Anemia, unspecified type [D64.9] Patient  Active Problem List   Diagnosis Date Noted  . Acute encephalopathy 09/20/2018  . Decubitus ulcers 09/01/2018  . Nausea and vomiting 08/13/2018  . Olecranon bursitis, left elbow 07/28/2018  . Tachycardia   . Acute diastolic heart failure ([place])   . Pressure injury of skin 06/13/2018  . Severe anemia 06/11/2018  . Other constipation 05/20/2018  . Metastasis to lung ([place]) 05/20/2018  . Cancer associated pain 05/15/2018  . Physical debility 05/13/2018  . S/P thoracentesis   . SVT (supraventricular tachycardia) ([place])   . Increased oxygen demand   . Tachypnea   . Hypoxia   . Fever 04/24/2018  . Chronic diastolic CHF (congestive heart failure) ([place]) 04/24/2018  . Anemia associated with chemotherapy 04/24/2018  . Chemotherapy-induced thrombocytopenia 04/24/2018  . Acute cystitis without hematuria   . Pneumonia due to infectious organism   . Malnutrition of moderate degree 04/09/2018  . Septic shock ([place])   . Sepsis ([place]) 04/07/2018  . Hypokalemia 04/07/2018  . Hypomagnesemia 04/07/2018  . Acute lower UTI 04/07/2018  . Hypophosphatemia 04/07/2018  . Metastatic breast cancer ([place]) 03/17/2018  . Metastasis to brain ([place]) 03/17/2018  . Metastasis to liver ([place]) 03/17/2018  . Metastasis to bone ([place]) 03/17/2018  . Pancytopenia, acquired ([place]) 03/17/2018  . Goals of care, counseling/discussion 03/17/2018  . Encounter for antineoplastic chemotherapy 03/17/2018  . Thrombocytopenia ([place]) 03/11/2018  . Uncontrolled diabetes mellitus with hyperglycemia ([place]) 03/11/2018  . Moderate persistent asthma 03/11/2018   PCP:  Robyne Peers, MD Pharmacy:   [id number] - [place], [place] - [address]  AT [place] [address] [place] [address] Phone: [phone] Fax: [phone]     Social Determinants of Health (SDOH) Interventions    Readmission Risk Interventions Readmission Risk Prevention Plan 09/08/2018 06/13/2018 04/25/2018  Transportation Screening  Complete Complete -  Medication Review Press photographer) Complete Complete -  PCP or Specialist appointment within 3-5 days of discharge Not Complete Not Complete (No Data)  PCP/Specialist Appt Not Complete comments not ready to dc not close to discharge -  [place] or Home Care Consult Complete Complete -  SW Recovery Care/Counseling Consult Complete Not Complete -  SW Consult Not Complete Comments - no evidence of need at this time -  Palliative Care Screening Not Applicable Not Applicable -  [place] Not Applicable Not Applicable -  Some recent data might be hidden

## 2018-09-09 NOTE — Progress Notes (Signed)
Initial Nutrition Assessment  INTERVENTION:   -Provide Ensure Enlive po BID, each supplement provides 350 kcal and 20 grams of protein -Provide Juven Fruit Punch BID, each serving provides 95kcal and 2.5g of protein (amino acids glutamine and arginine) -Provide Magic cup BID with meals, each supplement provides 290 kcal and 9 grams of protein -D/c Calorie Count   NUTRITION DIAGNOSIS:   Increased nutrient needs related to cancer and cancer related treatments, wound healing as evidenced by estimated needs.  GOAL:   Patient will meet greater than or equal to 90% of their needs  MONITOR:   PO intake, Supplement acceptance, Labs, Weight trends, Skin, I & O's  REASON FOR ASSESSMENT:   Consult Assessment of nutrition requirement/status, Calorie Count  ASSESSMENT:   58 year old Caucasian female with past medical history of metastatic breast cancer, diabetes mellitus type 2, asthma was brought to hospital after altered mental status. Admitted for acute encephalopathy.  **RD working remotely**  Patient seen by nutrition team in multiple previous admissions this year. Last admission was in May 2020. Since that time, pt has continued to have poor appetite and intakes and has developed multiple pressure injuries.  Calorie Count was ordered. No PO documented at this time. Will continue to monitor.  Ensure supplements have been ordered, pt drinking 1 each day. Will add Magic Cups to lunch and dinner meals and Juven BID to aid in wound healing.   Per oncology note, pt has not tolerated chemotherapy. Not resuming chemotherapy at this time.  Per weight records, pt has lost 38 lbs since 3/16 ( 23% wt loss x 5 months, significant for time frame).   Medications: Multivitamin with minerals daily, IV KCl Labs reviewed: CBGs: 127-201 Low Na, K    NUTRITION - FOCUSED PHYSICAL EXAM:  Unable to perform -working remotely.  Diet Order:   Diet Order            Diet heart healthy/carb modified  Room service appropriate? Yes; Fluid consistency: Thin  Diet effective now              EDUCATION NEEDS:   Not appropriate for education at this time  Skin:  Skin Assessment: Skin Integrity Issues: Skin Integrity Issues:: DTI, Stage I, Stage II, Unstageable DTI: left calf Stage I: left and right ear, left and right hip Stage II: back Unstageable: sacrum, left heel, right thigh  Last BM:  8/10  Height:   Ht Readings from Last 1 Encounters:  09/08/18 5\' 2"  (1.575 m)    Weight:   Wt Readings from Last 1 Encounters:  09/08/18 57.4 kg    Ideal Body Weight:  50 kg  BMI:  Body mass index is 23.15 kg/m.  Estimated Nutritional Needs:   Kcal:  2000-2200  Protein:  90-100g  Fluid:  2L/day  Clayton Bibles, MS, RD, LDN [place] Dietitian Pager: [phone] After Hours Pager: [phone]

## 2018-09-09 NOTE — Progress Notes (Signed)
PT Cancellation Note  Patient Details Name: KIYAH DEMARTINI MRN: [id number] DOB: 09/05/1960   Cancelled Treatment:    Reason Eval/Treat Not Completed: Attempted PT eval. Pt declined participation despite encouragement. Will check back another day as schedule allows.   Weston Anna, PT Acute Rehabilitation Services Pager: [phone] Office: [phone]

## 2018-09-09 NOTE — Progress Notes (Signed)
OT Cancellation Note  Patient Details Name: Stacy Shelton MRN: [id number] DOB: [birth year]   Cancelled Treatment:    Reason Eval/Treat Not Completed: Other (comment). Pt declined to participate due to feeling poorly. Will attempt later date.   Ramond Dial, OT/L   Acute OT Clinical Specialist Acute Rehabilitation Services Pager [phone] Office [phone]  09/09/2018, 1:25 PM

## 2018-09-09 NOTE — Progress Notes (Addendum)
Stacy Shelton   DOB:04/07/1960   ZY#:[id number]   The patient is seen and examined.  I agree with the documentation as follows ASSESSMENT & PLAN:  Metastatic breast cancer ([place]) She has significant complication from each cycle of treatment despite dose adjustment She is slowly recovering from side effects of therapy She is not ready to resume chemotherapy Continue aggressive supportive care now  Acquired pancytopenia Received 2 units of packed red blood cells on 09/08/2018 with improvement of hemoglobin Platelets remained stable and she does not have any bleeding No transfusion indicated today  Insulin dependent diabetes Continue insulin sliding scale  Hypokalemia Replete per hospitalist  Decubitus ulcers She has significant pain secondary to multiple new decubitus ulcer Wound care is consulted  Malnutrition of moderate degree She has significant malnutrition We have extensive discussion about taking Remeron and nutritional supplement as tolerated I have requested nursing staff to offer her nutritional supplement every 2-3 hours while awake I also recommend calorie count The patient appears to be motivated to increase oral intake as tolerated I did not identify any barriers for her from eating I do not recommend feeding tube placement The patient has no swallowing difficulties and denies nausea  Physical debility, critical myopathy She has not participated with physical therapy for over 3 weeks Will consult PT & OT; the patient has declined assessment today I reinforced the importance of getting physical therapy and Occupational Therapy in order to get better 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 I suggested  Psychiatry consult yesterday. Patient declined Her sister thinks the patient is incompetent.  After significant discussion with the patient today, she did not think that she needs psychiatry evaluation.  She is competent to make medical decision.  Code Status FULL  Goals of care I had numerous goals of care discussion with the patient and family  Discharge planning Consulted social worker for rehab and SNF  Possibility also include LTAC I spent over 35 minutes with the patient with counseling I will follow tomorrow  All questions were answered. The patient knows to call the clinic with any problems, questions or concerns.   Mikey Bussing, NP 09/09/2018 11:31 AM  [name], MD  Subjective:  Denies pain this morning Reports that her breathing is stable, no cough Denies nausea and vomiting Denies bleeding She did not eat much and started apologizing to me for not eating enough.  She also deferred physical therapy/Occupational Therapy The patient knows that she needs to eat enough calories to prevent weakness and she needs to participate with physical therapy and Occupational Therapy to get better  Oncology History Overview Note  Biopsy is ER negative Her2/neu positive   Metastatic breast cancer ([place])  03/11/2018 Imaging   Ct scan of chest, abdomen and pelvis 1. 2.6 x 2.3 cm spiculated mass identified inferomedial quadrant of the left breast. Lesion appears to retract the overlying skin. Mammographic correlation recommended. 2. Small lymph nodes in the left axillar ill-defined and suspicious for metastatic disease. 3. Multiple bilateral pulmonary nodules measuring up to 2.7 cm diameter. These probably represent metastatic disease. Given the dominant size of the central right upper lobe lesion, synchronous lung primary can not be completely excluded. 4. Multiple ill-defined liver lesions compatible with metastatic disease. Dominant  liver metastases measure up to almost 4 cm. 5.   Aortic Atherosclerois (ICD10-170.0)    03/12/2018 Pathology Results   Liver, needle/core biopsy, left lobe - METASTATIC CARCINOMA. - LYMPHOVASCULAR INVASION IS IDENTIFIED. - SEE COMMENT. Microscopic Comment The tumor cells are positive for cytokeratin 7. There is faint staining for GATA-3. There is non-specific staining for TTF-1. Cytokeratin 20, CDX-2, estrogen receptor, and GCDFP stains are negative. Given the clinical suspicion, the profile supports a primary breast carcinoma. Her2 will be performed and the results reported separately.  By immunohistochemistry, the tumor cells are POSITIVE for Her2 (3+).   03/12/2018 Imaging   Single 1.3 x 1.4 cm RIGHT occipital metastasis.  Borderline pachymeningeal enhancement of symmetric nature could be related to the superficial metastasis or osseous disease.   03/12/2018 Procedure   Ultrasound-guided core biopsy performed of a mass within the lateral segment of the left lobe of the liver.   03/16/2018 Imaging   Right occipital lobe 13 x 16 mm. Small satellite enhancing nodule deep to the larger lesion. The larger lesion shows central necrosis and probable mild hemorrhage or calcification.  Mild dural enhancement is less impressive and could be within normal limits.  Bone marrow in the clivus and cervical spine is diffusely low signal. No focal lesion. This may be related to the patient's anemia and abnormal blood count.   03/17/2018 Cancer Staging   Staging form: Breast, AJCC 8th Edition - Clinical: Stage IV (cT2, cN0, pM1, GX, ER-, PR: Not Assessed, HER2+) - Signed by [name], MD on 03/17/2018   03/19/2018 -  Chemotherapy   She received chemo with Taxotere, Herceptin and Perjeta   03/21/2018 Echocardiogram   IMPRESSIONS 1. The left ventricle has normal systolic function with an ejection fraction of 60-65%. The cavity size was normal. Left ventricular diastolic Doppler parameters are consistent with impaired relaxation.  2. The right  ventricle has normal systolic function. The cavity was normal. There is no increase in right ventricular wall thickness.  3. The mitral valve is normal in structure.  4. The tricuspid valve is normal in structure.  5. Strain imaging performed but not reported due to interpreter judgement, secondary to suboptimal image quality.   03/24/2018 Procedure   Successful placement of a right IJ approach Power Port with ultrasound and fluoroscopic guidance. The catheter is ready for use.   04/07/2018 - 04/16/2018 Hospital Admission   She was admitted for pneumonia   04/07/2018 - 04/16/2018 Hospital Admission   She was admitted to the hospital for pneumonia and respiratory failure   04/25/2018 Imaging   Retropharyngeal fluid collection compatible with effusion or possibly abscess. Soft tissue thickening extends into the right neck and surrounds the right carotid bifurcation and right internal carotid artery. There is also streaky density in the right lateral neck soft tissues with a focal 7.6 Mm lymph node in the right lateral neck. Stranding surrounds the right submandibular gland.   Findings are most compatible with infection. Favor pharyngitis with retropharyngeal effusion/abscess. Soft tissue thickening around the right carotid most compatible with infection rather than tumor. Right jugular vein is patent.  Multiple nodular densities in the right upper lobe may represent residual pneumonia or metastatic disease. Interval improvement in right upper lobe infiltrate compared with CT of 04/08/2018   04/25/2018 - 05/06/2018 Hospital Admission   She was admitted for retropharyngeal abscess   04/27/2018 Imaging   No evidence of large central pulmonary embolus is noted in the main pulmonary artery or the main portions of the  right and left pulmonary arteries. However, evaluation of the lower lobe branches, particularly on the right, is limited due to respiratory motion artifact and other limiting issues. Pulmonary  emboli and smaller peripheral branches can not be excluded on the basis of this exam.  Large left pleural effusion is noted with complete atelectasis of the left lower lobe. Increased left upper lobe opacity is noted posteriorly concerning for atelectasis or pneumonia.  Improved right upper lobe and lower lobe opacities are noted suggesting improving pneumonia or atelectasis.  15 mm right paratracheal lymph node is noted which is enlarged compared to prior exam; it is uncertain if this is metastatic or inflammatory in etiology.  Hepatic metastatic lesions are again noted.  Aortic Atherosclerosis (ICD10-I70.0).    04/27/2018 Procedure   Successful ultrasound guided left thoracentesis yielding 120 mL of blood-tinged of pleural fluid.   06/11/2018 Imaging   1. No evidence of pulmonary emboli. 2. Extensive bilateral lower lobe consolidation. 3. Small pleural effusions. 4. 2.5 cm right upper lobe nodule, enlarged from 04/27/2018 and which may reflect a metastasis or primary lung cancer. Stable to mild enlargement of subcentimeter nodules in both upper lobes consistent with known metastases. 5. Decreased size of liver metastases. 6. Unchanged mild mediastinal and right hilar lymphadenopathy. 7. Aortic Atherosclerosis (ICD10-I70.0).   06/11/2018 - 06/22/2018 Hospital Admission   She was hospitalized for infection and SVT   07/18/2018 Imaging   MRI brain 1. Regression of solitary right occipital lobe metastasis and edema. No new brain metastasis. 2. New ill-defined enhancement at the atlantooccipital membrane, presumably a metastatic focus. The adjacent bone marrow is abnormally low signal but not enhancing to the degree of the extraosseous lesion. Marrow signal is likely from anemia.     Metastasis to brain ([place])  03/17/2018 Initial Diagnosis   Metastasis to brain [facility])   Metastasis to liver ([place])  03/17/2018 Initial Diagnosis   Metastasis to liver [facility])   Metastasis to bone ([place])   03/17/2018 Initial Diagnosis   Metastasis to bone ([facility])      Objective:  Vitals:   09/09/18 0754 09/09/18 0800  BP:    Pulse:    Resp:    Temp:  99.1 F (37.3 C)  SpO2: 99%      Intake/Output Summary (Last 24 hours) at 09/09/2018 1131 Last data filed at 09/09/2018 0304 Gross per 24 hour  Intake 1146.28 ml  Output 1150 ml  Net -3.72 ml    GENERAL:alert, no distress and comfortable SKIN: skin color, texture, turgor are normal, no rashes or significant lesions EYES: normal, Conjunctiva are pink and non-injected, sclera clear OROPHARYNX:no exudate, no erythema and lips, buccal mucosa, and tongue normal  NECK: supple, thyroid normal size, non-tender, without nodularity LYMPH:  no palpable lymphadenopathy in the cervical, axillary or inguinal LUNGS: clear to auscultation and percussion with normal breathing effort HEART: regular rate & rhythm and no murmurs and no lower extremity edema ABDOMEN:abdomen soft, non-tender and normal bowel sounds Musculoskeletal:no cyanosis of digits and no clubbing  NEURO: alert & oriented x 3 with fluent speech, she has severe critical myeopathy   Labs:  Recent Labs    05/06/18 0430  09/01/18 0845 08/30/2018 1858 09/08/18 0031 09/09/18 0525  NA 138   < > 136 128* 133* 130*  K 3.6   < > 4.0 3.5 4.2 3.0*  CL 99   < > 97* 94* 98 98  CO2 32   < > _0 GLUCOSE 138*   < >  178* 214* 118* 250*  BUN 14   < > _0 CREATININE 0.54   < > 0.55 0.49 0.48 0.43*  CALCIUM 7.8*   < > 8.5* 7.3* 7.8* 7.3*  GFRNONAA >60   < > >60 >60 >60 >60  GFRAA >60   < > >60 >60 >60 >60  PROT 5.4*   < > 5.9* 5.5*  --  5.3*  ALBUMIN 1.7*   < > 1.6* 1.7*  --  1.6*  AST 23   < > 11* 23  --  13*  ALT 14   < > 9 16  --  14  ALKPHOS 372*   < > 182* 176*  --  183*  BILITOT 2.0*   < > 1.7* 1.8*  --  2.2*  BILIDIR 1.0*  --   --   --   --   --   IBILI 1.0*  --   --   --   --   --    < > = values in this interval not displayed.    Studies:  Dg Chest  Portable 1 View  Result Date: 09/17/2018 CLINICAL DATA:  58 year old female with weakness. History of metastatic breast cancer. EXAM: PORTABLE CHEST 1 VIEW COMPARISON:  Chest radiograph dated 06/22/2018 and CT dated 06/11/2018 FINDINGS: There is shallow inspiration with bibasilar atelectasis. A 4.4 cm rounded opacity in the right upper lobe corresponds to density seen on the prior CT and may represent metastatic disease or primary lung cancer. Additional smaller 14 mm nodular density noted in the right upper lobe more laterally. There is probable small bilateral pleural effusions. No pneumothorax. There is mild cardiomegaly. Atherosclerotic calcification of aortic arch. Right sided Port-A-Cath with tip in the region of the cavoatrial junction. No acute osseous pathology. IMPRESSION: 1. Small bilateral pleural effusions and bibasilar atelectasis or infiltrate. 2. Right upper lobe rounded opacity corresponding to the density seen on the prior CT. An additional 14 mm right upper lobe pulmonary nodule noted. Electronically Signed   By: Anner Crete M.D.   On: 09/09/2018 19:07

## 2018-09-09 NOTE — Progress Notes (Addendum)
[facility]  PROGRESS NOTE  Stacy Shelton RDE:[id number] DOB: March 27, 1960 DOA: 09/11/2018 PCP: Robyne Peers, MD   Brief HPI:   58 year old Caucasian female with past medical history of metastatic breast cancer, diabetes mellitus type 2, asthma was brought to hospital after altered mental status.  Found to be hypoglycemic with blood glucose 19.  She was started on broad-spectrum antibiotic for infectious etiology.  Also patient found to be severely anemic and thrombocytopenic.  Transfused  PRBC and platelets.    Subjective   Patient seen and examined, feels better this morning.  Denies chest pain or shortness of breath.  She became hypotensive yesterday, requiring brief Levophed infusion.  Levophed has been discontinued at this time.   Assessment/Plan:    1. Acute encephalopathy-improved, likely back to baseline.  Blood glucose has improved.  UA shows 21-50 WBCs, positive nitrite. Urine culture is growing greater than 100000 colonies of enterococcus faecalis.  Blood cultures is negative to date, final result still pending continue broad-spectrum antibiotics vancomycin and cefepime.  2. UTI-patient has greater than 100,000 colonies of Enterococcus faecalis growing in the culture, continue vancomycin as above.  Normal UA, started on IV antibiotics as above.  Will follow urine culture results.  3. 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  Patient presented with hemoglobin of 6.6, status post 3 unit PRBC.  This morning hemoglobin is 9.4 She also received 1 unit of platelets, platelet count went up from 5000-12000.  Dr. Alvy Bimler is following.  Follow recommendations.  4. Diabetes mellitus type 2-blood glucose is stable this morning.  She presented with hypoglycemia with blood glucose of 19.  Continue sliding scale insulin with NovoLog.  CBG well controlled.  5. Decubitus ulcers-patient is decubitus ulcers on buttocks, wound care has  been consulted.  Will follow recommendations.  6. 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 Continue metoprolol, amiodarone.  7. Hypokalemia-potassium was 3.0 this morning.  Will replace potassium with IV KCl 10 mEq x 3.  Follow BMP in a.m.  8. Hyponatremia-sodium is 128 at the time of admission, improved 130 with IV normal saline.  Urine sodium was 31, serum osmolality is 277, ? SIADH. Follow serum sodium in am.  9. Severe protein calorie malnutrition-serum albumin is 1.7, secondary to metastatic breast cancer.  Poor p.o. intake. Dietitian consult.  10. Metastatic breast cancer-patient is off chemotherapy due to significant side effects.  Dr. Alvy Bimler is going to discuss goals of care with patient, continue supportive care at this time.  Continue Morphine 15 mg every 4 hours as needed     CBG: Recent Labs  Lab 09/08/18 1141 09/08/18 1639 09/08/18 2113 09/09/18 0730 09/09/18 1124  GLUCAP 207* 156* 127* 201* 129*    CBC: Recent Labs  Lab 09/03/2018 1805 09/08/18 0031 09/08/18 0652 09/08/18 0806 09/08/18 2222 09/09/18 0525  WBC 5.8 3.6*  --  3.6*  --  5.3  NEUTROABS 3.6  --   --   --   --   --   HGB 6.6* 7.2* 6.6* 6.8* 9.5* 9.4*  HCT 21.0* 22.6* 20.7* 21.0* 28.6* 28.9*  MCV 90.5 89.7  --  86.1  --  86.5  PLT 5* 5*  --  15*  --  12*    Basic Metabolic Panel: Recent Labs  Lab 09/29/2018 1858 09/08/18 0031 09/09/18 0525  NA 128* 133* 130*  K 3.5 4.2 3.0*  CL 94* 98 98  CO2 24 24 24   GLUCOSE 214* 118* 250*  BUN  12 11 12   CREATININE 0.49 0.48 0.43*  CALCIUM 7.3* 7.8* 7.3*  MG  --  1.7  --   PHOS  --  3.6  --      DVT prophylaxis: SCDs  Code Status: Full code  Family Communication: No family at bedside  Disposition Plan: likely home when medically ready for discharge   Scheduled medications:  . acetaminophen  650 mg Oral Once  . amiodarone  200 mg Oral Daily  . budesonide (PULMICORT) nebulizer solution  0.25 mg Nebulization BID  .  Chlorhexidine Gluconate Cloth  6 each Topical Daily  . feeding supplement (ENSURE ENLIVE)  237 mL Oral BID BM  . insulin aspart  0-5 Units Subcutaneous QHS  . insulin aspart  0-9 Units Subcutaneous TID WC  . mouth rinse  15 mL Mouth Rinse BID  . montelukast  10 mg Oral QHS  . multivitamin with minerals  1 tablet Oral Daily  . nutrition supplement (JUVEN)  1 packet Oral BID BM  . pantoprazole  40 mg Oral Daily  . sodium chloride flush  10-40 mL Intracatheter Q12H    Consultants:  Oncology  Procedures:     Antibiotics:   Anti-infectives (From admission, onward)   Start     Dose/Rate Route Frequency Ordered Stop   09/09/18 0300  vancomycin (VANCOCIN) IVPB 1000 mg/200 mL premix     1,000 mg 200 mL/hr over 60 Minutes Intravenous Daily 09/08/18 0323     09/08/18 0600  ceFEPIme (MAXIPIME) 2 g in sodium chloride 0.9 % 100 mL IVPB     2 g 200 mL/hr over 30 Minutes Intravenous Every 8 hours 09/08/18 0323     09/18/2018 1900  ceFEPIme (MAXIPIME) 2 g in sodium chloride 0.9 % 100 mL IVPB     2 g 200 mL/hr over 30 Minutes Intravenous  Once 09/18/2018 1858 09/14/2018 2120   09/20/2018 1900  metroNIDAZOLE (FLAGYL) IVPB 500 mg     500 mg 100 mL/hr over 60 Minutes Intravenous  Once 09/08/2018 1858 09/11/2018 2228   09/26/2018 1900  vancomycin (VANCOCIN) IVPB 1000 mg/200 mL premix     1,000 mg 200 mL/hr over 60 Minutes Intravenous  Once 09/11/2018 1858 09/08/18 0221       Objective   Vitals:   09/09/18 0600 09/09/18 0754 09/09/18 0800 09/09/18 1200  BP: (!) 114/40     Pulse: 87     Resp: 16     Temp:   99.1 F (37.3 C) 99.7 F (37.6 C)  TempSrc:   Oral Oral  SpO2: 100% 99%    Weight:      Height:        Intake/Output Summary (Last 24 hours) at 09/09/2018 1248 Last data filed at 09/09/2018 0304 Gross per 24 hour  Intake 1146.28 ml  Output 500 ml  Net 646.28 ml   Filed Weights   09/08/18 0000  Weight: 57.4 kg     Physical Examination:  General-appears in no acute  distress Heart-S1-S2, regular, no murmur auscultated Lungs-clear to auscultation bilaterally, no wheezing or crackles auscultated Abdomen-soft, nontender, no organomegaly Extremities-no edema in the lower extremities Neuro-alert, oriented x3, no focal deficit noted     Data Reviewed: I have personally reviewed following labs and imaging studies   Recent Results (from the past 240 hour(s))  Culture, blood (routine x 2)     Status: None (Preliminary result)   Collection Time: 09/09/2018  6:06 PM   Specimen: BLOOD  Result Value Ref Range Status  Specimen Description   Final    BLOOD PICC LINE Performed at [facility], [place] [address] [place], [place] [address]    Special Requests   Final    BOTTLES DRAWN AEROBIC AND ANAEROBIC Blood Culture adequate volume Performed at [facility] [address], [place], [place] [address]    Culture   Final    NO GROWTH < 24 HOURS Performed at [place] [address] [place], [place] [address]    Report Status PENDING  Incomplete  SARS Coronavirus 2 [facility] order, Performed in [facility] hospital lab) Nasopharyngeal Nasopharyngeal Swab     Status: None   Collection Time: 09/14/2018  6:11 PM   Specimen: Nasopharyngeal Swab  Result Value Ref Range Status   SARS Coronavirus 2 NEGATIVE NEGATIVE Final    Comment: (NOTE) If result is NEGATIVE SARS-CoV-2 target nucleic acids are NOT DETECTED. The SARS-CoV-2 RNA is generally detectable in upper and lower  respiratory specimens during the acute phase of infection. The lowest  concentration of SARS-CoV-2 viral copies this assay can detect is 250  copies / mL. A negative result does not preclude SARS-CoV-2 infection  and should not be used as the sole basis for treatment or other  patient management decisions.  A negative result may occur with  improper specimen collection / handling, submission of specimen other  than nasopharyngeal swab, presence  of viral mutation(s) within the  areas targeted by this assay, and inadequate number of viral copies  (<250 copies / mL). A negative result must be combined with clinical  observations, patient history, and epidemiological information. If result is POSITIVE SARS-CoV-2 target nucleic acids are DETECTED. The SARS-CoV-2 RNA is generally detectable in upper and lower  respiratory specimens dur ing the acute phase of infection.  Positive  results are indicative of active infection with SARS-CoV-2.  Clinical  correlation with patient history and other diagnostic information is  necessary to determine patient infection status.  Positive results do  not rule out bacterial infection or co-infection with other viruses. If result is PRESUMPTIVE POSTIVE SARS-CoV-2 nucleic acids MAY BE PRESENT.   A presumptive positive result was obtained on the submitted specimen  and confirmed on repeat testing.  While 2019 novel coronavirus  (SARS-CoV-2) nucleic acids may be present in the submitted sample  additional confirmatory testing may be necessary for epidemiological  and / or clinical management purposes  to differentiate between  SARS-CoV-2 and other Sarbecovirus currently known to infect humans.  If clinically indicated additional testing with an alternate test  methodology [phone]) is advised. The SARS-CoV-2 RNA is generally  detectable in upper and lower respiratory sp ecimens during the acute  phase of infection. The expected result is Negative. Fact Sheet for Patients:  StrictlyIdeas.no Fact Sheet for Healthcare Providers: BankingDealers.co.za This test is not yet approved or cleared by the [place] FDA and has been authorized for detection and/or diagnosis of SARS-CoV-2 by FDA under an Emergency Use Authorization (EUA).  This EUA will remain in effect (meaning this test can be used) for the duration of the COVID-19 declaration under Section  564(b)(1) of the Act, 21 U.S.C. section 360bbb-3(b)(1), unless the authorization is terminated or revoked sooner. Performed at [facility], [place] [address] [place], [place] [address]   MRSA PCR Screening     Status: None   Collection Time: 09/02/2018 11:25 PM   Specimen: Nasal Mucosa; Nasopharyngeal  Result Value Ref Range Status   MRSA by  PCR NEGATIVE NEGATIVE Final    Comment:        The GeneXpert MRSA Assay (FDA approved for NASAL specimens only), is one component of a comprehensive MRSA colonization surveillance program. It is not intended to diagnose MRSA infection nor to guide or monitor treatment for MRSA infections. Performed at [facility], [place] [address], [place], [place] [address]   Culture, blood (routine x 2)     Status: None (Preliminary result)   Collection Time: 09/08/18 12:31 AM   Specimen: BLOOD LEFT HAND  Result Value Ref Range Status   Specimen Description   Final    BLOOD LEFT HAND Performed at [facility], [place] [address] [place], [place] [address]    Special Requests   Final    BOTTLES DRAWN AEROBIC ONLY Blood Culture results may not be optimal due to an inadequate volume of blood received in culture bottles Performed at [place] [address] [place], [place] [address]    Culture PENDING  Incomplete   Report Status PENDING  Incomplete  Urine culture     Status: Abnormal (Preliminary result)   Collection Time: 09/08/18  2:55 AM   Specimen: Urine, Random  Result Value Ref Range Status   Specimen Description   Final    URINE, RANDOM Performed at [place] [address] [place], [place] [address]    Special Requests   Final    NONE Performed at [facility], [place] [address], [place], [place] [address]    Culture (A)  Final    >=100,000 COLONIES/mL ENTEROCOCCUS FAECALIS SUSCEPTIBILITIES TO FOLLOW Performed at [facility],  [place] [address] [place], [place] [address]    Report Status PENDING  Incomplete     Liver Function Tests: Recent Labs  Lab 09/03/2018 1858 09/09/18 0525  AST 23 13*  ALT 16 14  ALKPHOS 176* 183*  BILITOT 1.8* 2.2*  PROT 5.5* 5.3*  ALBUMIN 1.7* 1.6*   No results for input(s): LIPASE, AMYLASE in the last 168 hours. No results for input(s): AMMONIA in the last 168 hours.  Cardiac Enzymes: No results for input(s): CKTOTAL, CKMB, CKMBINDEX, TROPONINI in the last 168 hours. BNP (last 3 results) Recent Labs    06/11/18 2030  BNP 809.0*    ProBNP (last 3 results) No results for input(s): PROBNP in the last 8760 hours.    Studies: Dg Chest Portable 1 View  Result Date: 09/18/2018 CLINICAL DATA:  58 year old female with weakness. History of metastatic breast cancer. EXAM: PORTABLE CHEST 1 VIEW COMPARISON:  Chest radiograph dated 06/22/2018 and CT dated 06/11/2018 FINDINGS: There is shallow inspiration with bibasilar atelectasis. A 4.4 cm rounded opacity in the right upper lobe corresponds to density seen on the prior CT and may represent metastatic disease or primary lung cancer. Additional smaller 14 mm nodular density noted in the right upper lobe more laterally. There is probable small bilateral pleural effusions. No pneumothorax. There is mild cardiomegaly. Atherosclerotic calcification of aortic arch. Right sided Port-A-Cath with tip in the region of the cavoatrial junction. No acute osseous pathology. IMPRESSION: 1. Small bilateral pleural effusions and bibasilar atelectasis or infiltrate. 2. Right upper lobe rounded opacity corresponding to the density seen on the prior CT. An additional 14 mm right upper lobe pulmonary nodule noted. Electronically Signed   By: Anner Crete M.D.   On: 09/20/2018 19:07     Admission status: Inpatient: Based on patients clinical presentation and evaluation  of above clinical data, I have made determination that patient meets Inpatient criteria at this  time.  Time spent: 20 min  [facility] Pager [phone]. If 7PM-7AM, please contact night-coverage at [email or web address], Office  [phone]  password [email or web address]  09/09/2018, 12:48 PM  LOS: 2 days

## 2018-09-09 NOTE — Progress Notes (Signed)
Pt began to refuse blood pressure checks. Pt was educated on the importance of checking BP dt recent need for pressors. Pt verbalized understanding and still requested no BP checks. I will continue to educate and monitor pt

## 2018-09-10 ENCOUNTER — Inpatient Hospital Stay (HOSPITAL_COMMUNITY): Payer: BC Managed Care – PPO

## 2018-09-10 DIAGNOSIS — T451X5A Adverse effect of antineoplastic and immunosuppressive drugs, initial encounter: Secondary | ICD-10-CM

## 2018-09-10 DIAGNOSIS — D6481 Anemia due to antineoplastic chemotherapy: Secondary | ICD-10-CM

## 2018-09-10 DIAGNOSIS — R609 Edema, unspecified: Secondary | ICD-10-CM

## 2018-09-10 LAB — COMPREHENSIVE METABOLIC PANEL
ALT: 25 U/L (ref 0–44)
AST: 24 U/L (ref 15–41)
Albumin: 1.4 g/dL — ABNORMAL LOW (ref 3.5–5.0)
Alkaline Phosphatase: 353 U/L — ABNORMAL HIGH (ref 38–126)
Anion gap: 7 (ref 5–15)
BUN: 11 mg/dL (ref 6–20)
CO2: 24 mmol/L (ref 22–32)
Calcium: 7.5 mg/dL — ABNORMAL LOW (ref 8.9–10.3)
Chloride: 99 mmol/L (ref 98–111)
Creatinine, Ser: 0.34 mg/dL — ABNORMAL LOW (ref 0.44–1.00)
GFR calc Af Amer: >60 mL/min (ref >60)
GFR calc non Af Amer: >60 mL/min (ref >60)
Glucose, Bld: 211 mg/dL — ABNORMAL HIGH (ref 70–99)
Potassium: 3.5 mmol/L (ref 3.5–5.1)
Sodium: 130 mmol/L — ABNORMAL LOW (ref 135–145)
Total Bilirubin: 1.8 mg/dL — ABNORMAL HIGH (ref 0.3–1.2)
Total Protein: 5.3 g/dL — ABNORMAL LOW (ref 6.5–8.1)

## 2018-09-10 LAB — CBC WITH DIFFERENTIAL/PLATELET
Abs Immature Granulocytes: 0.43 10*3/uL — ABNORMAL HIGH (ref 0.00–0.07)
Basophils Absolute: 0.2 10*3/uL — ABNORMAL HIGH (ref 0.0–0.1)
Basophils Relative: 5 %
Eosinophils Absolute: 0 10*3/uL (ref 0.0–0.5)
Eosinophils Relative: 0 %
HCT: 27 % — ABNORMAL LOW (ref 36.0–46.0)
Hemoglobin: 9 g/dL — ABNORMAL LOW (ref 12.0–15.0)
Immature Granulocytes: 14 %
Lymphocytes Relative: 19 %
Lymphs Abs: 0.6 10*3/uL — ABNORMAL LOW (ref 0.7–4.0)
MCH: 28.9 pg (ref 26.0–34.0)
MCHC: 33.3 g/dL (ref 30.0–36.0)
MCV: 86.8 fL (ref 80.0–100.0)
Monocytes Absolute: 0.3 10*3/uL (ref 0.1–1.0)
Monocytes Relative: 9 %
Neutro Abs: 1.6 10*3/uL — ABNORMAL LOW (ref 1.7–7.7)
Neutrophils Relative %: 53 %
Platelets: 10 10*3/uL — CL (ref 150–400)
RBC: 3.11 MIL/uL — ABNORMAL LOW (ref 3.87–5.11)
RDW: 17 % — ABNORMAL HIGH (ref 11.5–15.5)
WBC: 3.1 10*3/uL — ABNORMAL LOW (ref 4.0–10.5)
nRBC: 0 % (ref 0.0–0.2)

## 2018-09-10 LAB — CBC
HCT: 28.6 % — ABNORMAL LOW (ref 36.0–46.0)
Hemoglobin: 9.5 g/dL — ABNORMAL LOW (ref 12.0–15.0)
MCH: 28.6 pg (ref 26.0–34.0)
MCHC: 33.2 g/dL (ref 30.0–36.0)
MCV: 86.1 fL (ref 80.0–100.0)
Platelets: 7 10*3/uL — CL (ref 150–400)
RBC: 3.32 MIL/uL — ABNORMAL LOW (ref 3.87–5.11)
RDW: 16.9 % — ABNORMAL HIGH (ref 11.5–15.5)
WBC: 3.5 10*3/uL — ABNORMAL LOW (ref 4.0–10.5)
nRBC: 0 % (ref 0.0–0.2)

## 2018-09-10 LAB — URINE CULTURE: Culture: >=100000 — AB

## 2018-09-10 LAB — GLUCOSE, CAPILLARY
Glucose-Capillary: 222 mg/dL — ABNORMAL HIGH (ref 70–99)
Glucose-Capillary: 170 mg/dL — ABNORMAL HIGH (ref 70–99)
Glucose-Capillary: 163 mg/dL — ABNORMAL HIGH (ref 70–99)
Glucose-Capillary: 117 mg/dL — ABNORMAL HIGH (ref 70–99)

## 2018-09-10 MED ORDER — SODIUM CHLORIDE 0.9 % IV SOLN
1.0000 g | Freq: Four times a day (QID) | INTRAVENOUS | Status: AC
  Administered 2018-09-10 – 2018-09-14 (×18): 1 g via INTRAVENOUS
  Filled 2018-09-10 (×2): qty 1
  Filled 2018-09-10 (×2): qty 1000
  Filled 2018-09-10: qty 1
  Filled 2018-09-10: qty 1000
  Filled 2018-09-10: qty 1
  Filled 2018-09-10: qty 1000
  Filled 2018-09-10 (×2): qty 1
  Filled 2018-09-10: qty 1000
  Filled 2018-09-10: qty 1
  Filled 2018-09-10 (×2): qty 1000
  Filled 2018-09-10 (×3): qty 1
  Filled 2018-09-10: qty 1000

## 2018-09-10 MED ORDER — DIPHENHYDRAMINE HCL 25 MG PO CAPS
25.0000 mg | ORAL_CAPSULE | Freq: Once | ORAL | Status: AC
  Administered 2018-09-10: 25 mg via ORAL
  Filled 2018-09-10: qty 1

## 2018-09-10 MED ORDER — SODIUM CHLORIDE 0.9% IV SOLUTION
Freq: Once | INTRAVENOUS | Status: AC
  Administered 2018-09-10: 10:00:00 via INTRAVENOUS

## 2018-09-10 MED ORDER — INSULIN DETEMIR 100 UNIT/ML ~~LOC~~ SOLN
5.0000 [IU] | Freq: Every day | SUBCUTANEOUS | Status: DC
  Administered 2018-09-10 – 2018-09-11 (×2): 5 [IU] via SUBCUTANEOUS
  Filled 2018-09-10 (×3): qty 0.05

## 2018-09-10 MED ORDER — ACETAMINOPHEN 325 MG PO TABS
650.0000 mg | ORAL_TABLET | Freq: Once | ORAL | Status: AC
  Administered 2018-09-10: 650 mg via ORAL
  Filled 2018-09-10: qty 2

## 2018-09-10 MED ORDER — PRO-STAT SUGAR FREE PO LIQD
30.0000 mL | Freq: Two times a day (BID) | ORAL | Status: DC
  Administered 2018-09-10 – 2018-09-11 (×3): 30 mL via ORAL
  Filled 2018-09-10 (×3): qty 30

## 2018-09-10 NOTE — Progress Notes (Signed)
OT Cancellation Note  Patient Details Name: Stacy Shelton MRN: [id number] DOB: May 07, 1960   Cancelled Treatment:    Reason Eval/Treat Not Completed: Medical issues which prohibited therapy.  Pt's platelets are 7. Will check back tomorrow.  Rexanne Inocencio 09/10/2018, 7:28 AM  Lesle Chris, OTR/L Acute Rehabilitation Services [phone] [facility] pager [phone] office 09/10/2018

## 2018-09-10 NOTE — Progress Notes (Signed)
Pharmacy Antibiotic Note  Stacy Shelton is a 58 y.o. female admitted on 09/25/2018 with UTI.  Pharmacy has been consulted for ampicillin dosing.  8/10 UCx >=100,000 COLONIES/mL ENTEROCOCCUS FAECALIS- pan sensitive. WBC 3.5, SCr 0.34. AF.  D#3 of appropriate therapy for UTI  Plan: Ampicillin 1 gm IV q6h ? LOT  F/u ability to change to PO amox when able to take PO  Height: 5\' 2"  (157.5 cm) Weight: 126 lb 8.7 oz (57.4 kg) IBW/kg (Calculated) : 50.1  Temp (24hrs), Avg:98.9 F (37.2 C), Min:98 F (36.7 C), Max:99.7 F (37.6 C)  Recent Labs  Lab 09/11/2018 1805 09/25/2018 1858 09/08/2018 2045 09/08/18 0031 09/08/18 0806 09/09/18 0525 09/10/18 0511  WBC 5.8  --   --  3.6* 3.6* 5.3 3.5*  CREATININE  --  0.49  --  0.48  --  0.43* 0.34*  LATICACIDVEN 1.2  --  1.5  --   --   --   --     Estimated Creatinine Clearance: 60.6 mL/min (A) (by C-G formula based on SCr of 0.34 mg/dL (L)).    Allergies  Allergen Reactions  . Nsaids Anaphylaxis   Antimicrobials this admission:  8/9 vanc >> 8/12 8/9 cefepime >> 8/12 8/9 flagyl x 1 dose 8/12 ampicillin>> Dose adjustments this admission:   Microbiology results:  8/9 MRSA PCR neg 8/10 Ucx: > 100 K enterococcus faecalis - pan sensitive 8/10 BCx2: ngtd 8/9 SARS neg 8/9 sputum: ordered, not yet sent  Thank you for allowing pharmacy to be a part of this patient's care.  Eudelia Bunch, Pharm.D [phone] 09/10/2018 8:20 AM

## 2018-09-10 NOTE — Progress Notes (Signed)
PT Cancellation Note  Patient Details Name: Stacy Shelton MRN: [id number] DOB: 09-13-1960   Cancelled Treatment:    Reason Eval/Treat Not Completed: Medical issues which prohibited therapy--low platelets. Will check back another day   Weston Anna, [place] Pager: [phone] Office: [phone]

## 2018-09-10 NOTE — Progress Notes (Addendum)
Stacy Shelton   DOB:[birth year]   LS#:[id number]   The patient is seen and examined.  I agree with the documentation as follows ASSESSMENT & PLAN:  Metastatic breast cancer ([place]) She has significant complication from each cycle of treatment despite dose adjustment She is slowly recovering from side effects of therapy She is not ready to resume chemotherapy Continue aggressive supportive care now  Acquired pancytopenia Received 2 units of packed red blood cells on 09/08/2018 with improvement of hemoglobin Platelets down to 7,000 today; no bleeding reported Transfuse 1 unit of platelets today Continue to monitor blood counts carefully Her thrombocytopenia could be due to broad-spectrum IV antibiotics  UTI, E. Coli Hopefully, her broad-spectrum antibiotics can be discontinued and switched to something different Will defer to hospitalist  Insulin dependent diabetes Continue insulin sliding scale  Hypokalemia Replete per hospitalist  Decubitus ulcers She has significant pain secondary to multiple new decubitus ulcer Wound care is consulted  Malnutrition of moderate degree She has significant malnutrition We have extensive discussion about taking Remeron and nutritional supplement as tolerated I have requested nursing staff to offer her nutritional supplement every 2-3 hours while awake I also recommend calorie count The patient appears to be motivated to increase oral intake as tolerated I did not identify any barriers for her from eating I do not recommend feeding tube placement The patient has no swallowing difficulties and denies nausea  Physical debility, critical myopathy She has not participated with physical therapy for over 3 weeks PT/OT has been consulted I reinforced the importance of getting physical therapy and Occupational Therapy in order to get better 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 I suggested Psychiatry consult. Patient declined  Code Status FULL  Goals of care I had numerous goals of care discussion with the patient and family  Discharge planning Consulted social worker for rehab and SNF  Possibility also include LTAC I will follow tomorrow  All questions were answered. The patient knows to call the clinic with any problems, questions or concerns.   Mikey Bussing, NP 09/10/2018 11:29 AM  [name], MD  Subjective:  Reported pain to her left leg which is better with repositioning; no other pain reported States that she is "fine" today Reports that breathing is stable No abdominal pain, nausea, vomiting Denies bleeding Nurse reports that she refused to work with therapy yesterday Per nursing, has taken in approximately 3 Glucerna in the past 24 hours  Oncology History Overview Note  Biopsy is ER negative Her2/neu positive   Metastatic breast cancer ([place])  03/11/2018 Imaging   Ct scan of chest, abdomen and pelvis 1. 2.6 x 2.3 cm spiculated mass identified inferomedial quadrant of the left breast. Lesion appears to retract the overlying skin. Mammographic correlation recommended. 2. Small lymph nodes in the left axillar ill-defined and suspicious for metastatic disease. 3. Multiple bilateral pulmonary nodules measuring up to 2.7 cm diameter. These probably represent metastatic disease. Given the dominant size of the central right upper lobe lesion, synchronous lung primary can not be completely excluded. 4. Multiple ill-defined liver lesions compatible with metastatic disease. Dominant liver metastases measure up to almost 4 cm. 5.  Aortic Atherosclerois (ICD10-170.0)    03/12/2018 Pathology Results   Liver, needle/core biopsy, left lobe - METASTATIC CARCINOMA. - LYMPHOVASCULAR INVASION IS IDENTIFIED. - SEE  COMMENT. Microscopic  Comment The tumor cells are positive for cytokeratin 7. There is faint staining for GATA-3. There is non-specific staining for TTF-1. Cytokeratin 20, CDX-2, estrogen receptor, and GCDFP stains are negative. Given the clinical suspicion, the profile supports a primary breast carcinoma. Her2 will be performed and the results reported separately.  By immunohistochemistry, the tumor cells are POSITIVE for Her2 (3+).   03/12/2018 Imaging   Single 1.3 x 1.4 cm RIGHT occipital metastasis.  Borderline pachymeningeal enhancement of symmetric nature could be related to the superficial metastasis or osseous disease.   03/12/2018 Procedure   Ultrasound-guided core biopsy performed of a mass within the lateral segment of the left lobe of the liver.   03/16/2018 Imaging   Right occipital lobe 13 x 16 mm. Small satellite enhancing nodule deep to the larger lesion. The larger lesion shows central necrosis and probable mild hemorrhage or calcification.  Mild dural enhancement is less impressive and could be within normal limits.  Bone marrow in the clivus and cervical spine is diffusely low signal. No focal lesion. This may be related to the patient's anemia and abnormal blood count.   03/17/2018 Cancer Staging   Staging form: Breast, AJCC 8th Edition - Clinical: Stage IV (cT2, cN0, pM1, GX, ER-, PR: Not Assessed, HER2+) - Signed by [name], MD on 03/17/2018   03/19/2018 -  Chemotherapy   She received chemo with Taxotere, Herceptin and Perjeta   03/21/2018 Echocardiogram   IMPRESSIONS 1. The left ventricle has normal systolic function with an ejection fraction of 60-65%. The cavity size was normal. Left ventricular diastolic Doppler parameters are consistent with impaired relaxation.  2. The right ventricle has normal systolic function. The cavity was normal. There is no increase in right ventricular wall thickness.  3. The mitral valve is normal in structure.  4. The tricuspid valve is normal in  structure.  5. Strain imaging performed but not reported due to interpreter judgement, secondary to suboptimal image quality.   03/24/2018 Procedure   Successful placement of a right IJ approach Power Port with ultrasound and fluoroscopic guidance. The catheter is ready for use.   04/07/2018 - 04/16/2018 Hospital Admission   She was admitted for pneumonia   04/07/2018 - 04/16/2018 Hospital Admission   She was admitted to the hospital for pneumonia and respiratory failure   04/25/2018 Imaging   Retropharyngeal fluid collection compatible with effusion or possibly abscess. Soft tissue thickening extends into the right neck and surrounds the right carotid bifurcation and right internal carotid artery. There is also streaky density in the right lateral neck soft tissues with a focal 7.6 Mm lymph node in the right lateral neck. Stranding surrounds the right submandibular gland.   Findings are most compatible with infection. Favor pharyngitis with retropharyngeal effusion/abscess. Soft tissue thickening around the right carotid most compatible with infection rather than tumor. Right jugular vein is patent.  Multiple nodular densities in the right upper lobe may represent residual pneumonia or metastatic disease. Interval improvement in right upper lobe infiltrate compared with CT of 04/08/2018   04/25/2018 - 05/06/2018 Hospital Admission   She was admitted for retropharyngeal abscess   04/27/2018 Imaging   No evidence of large central pulmonary embolus is noted in the main pulmonary artery or the main portions of the right and left pulmonary arteries. However, evaluation of the lower lobe branches, particularly on the right, is limited due to respiratory motion artifact and other limiting issues. Pulmonary emboli and smaller peripheral branches can not be  excluded on the basis of this exam.  Large left pleural effusion is noted with complete atelectasis of the left lower lobe. Increased left upper lobe  opacity is noted posteriorly concerning for atelectasis or pneumonia.  Improved right upper lobe and lower lobe opacities are noted suggesting improving pneumonia or atelectasis.  15 mm right paratracheal lymph node is noted which is enlarged compared to prior exam; it is uncertain if this is metastatic or inflammatory in etiology.  Hepatic metastatic lesions are again noted.  Aortic Atherosclerosis (ICD10-I70.0).    04/27/2018 Procedure   Successful ultrasound guided left thoracentesis yielding 120 mL of blood-tinged of pleural fluid.   06/11/2018 Imaging   1. No evidence of pulmonary emboli. 2. Extensive bilateral lower lobe consolidation. 3. Small pleural effusions. 4. 2.5 cm right upper lobe nodule, enlarged from 04/27/2018 and which may reflect a metastasis or primary lung cancer. Stable to mild enlargement of subcentimeter nodules in both upper lobes consistent with known metastases. 5. Decreased size of liver metastases. 6. Unchanged mild mediastinal and right hilar lymphadenopathy. 7. Aortic Atherosclerosis (ICD10-I70.0).   06/11/2018 - 06/22/2018 Hospital Admission   She was hospitalized for infection and SVT   07/18/2018 Imaging   MRI brain 1. Regression of solitary right occipital lobe metastasis and edema. No new brain metastasis. 2. New ill-defined enhancement at the atlantooccipital membrane, presumably a metastatic focus. The adjacent bone marrow is abnormally low signal but not enhancing to the degree of the extraosseous lesion. Marrow signal is likely from anemia.     Metastasis to brain ([place])  03/17/2018 Initial Diagnosis   Metastasis to brain [facility])   Metastasis to liver ([place])  03/17/2018 Initial Diagnosis   Metastasis to liver [facility])   Metastasis to bone ([place])  03/17/2018 Initial Diagnosis   Metastasis to bone ([facility])      Objective:  Vitals:   09/10/18 1039 09/10/18 1042  BP: (!) 114/28 (!) 124/40  Pulse: (!) 101   Resp: (!) 38   Temp: 98.2 F (36.8  C)   SpO2: 100% 100%     Intake/Output Summary (Last 24 hours) at 09/10/2018 1129 Last data filed at 09/10/2018 0300 Gross per 24 hour  Intake 924.83 ml  Output -  Net 924.83 ml    GENERAL:alert, no distress and comfortable SKIN: skin color, texture, turgor are normal, no rashes or significant lesions EYES: normal, Conjunctiva are pink and non-injected, sclera clear OROPHARYNX:no exudate, no erythema and lips, buccal mucosa, and tongue normal  NECK: supple, thyroid normal size, non-tender, without nodularity LYMPH:  no palpable lymphadenopathy in the cervical, axillary or inguinal LUNGS: clear to auscultation and percussion with normal breathing effort HEART: regular rate & rhythm and no murmurs and no lower extremity edema ABDOMEN:abdomen soft, non-tender and normal bowel sounds Musculoskeletal:no cyanosis of digits and no clubbing  NEURO: alert & oriented x 3 with fluent speech, she has severe critical myelopathy.  She is able to move her hand and wriggle her toes   Labs:  Recent Labs    05/06/18 0430  09/24/2018 1858 09/08/18 0031 09/09/18 0525 09/10/18 0511  NA 138   < > 128* 133* 130* 130*  K 3.6   < > 3.5 4.2 3.0* 3.5  CL 99   < > 94* 98 98 99  CO2 32   < > 24 24 24 24   GLUCOSE 138*   < > 214* 118* 250* 211*  BUN 14   < > 12 11 12 11   CREATININE 0.54   < >  0.49 0.48 0.43* 0.34*  CALCIUM 7.8*   < > 7.3* 7.8* 7.3* 7.5*  GFRNONAA >60   < > >60 >60 >60 >60  GFRAA >60   < > >60 >60 >60 >60  PROT 5.4*   < > 5.5*  --  5.3* 5.3*  ALBUMIN 1.7*   < > 1.7*  --  1.6* 1.4*  AST 23   < > 23  --  13* 24  ALT 14   < > 16  --  14 25  ALKPHOS 372*   < > 176*  --  183* 353*  BILITOT 2.0*   < > 1.8*  --  2.2* 1.8*  BILIDIR 1.0*  --   --   --   --   --   IBILI 1.0*  --   --   --   --   --    < > = values in this interval not displayed.    Studies:  Dg Chest Portable 1 View  Result Date: 09/09/2018 CLINICAL DATA:  58 year old female with weakness. History of metastatic breast  cancer. EXAM: PORTABLE CHEST 1 VIEW COMPARISON:  Chest radiograph dated 06/22/2018 and CT dated 06/11/2018 FINDINGS: There is shallow inspiration with bibasilar atelectasis. A 4.4 cm rounded opacity in the right upper lobe corresponds to density seen on the prior CT and may represent metastatic disease or primary lung cancer. Additional smaller 14 mm nodular density noted in the right upper lobe more laterally. There is probable small bilateral pleural effusions. No pneumothorax. There is mild cardiomegaly. Atherosclerotic calcification of aortic arch. Right sided Port-A-Cath with tip in the region of the cavoatrial junction. No acute osseous pathology. IMPRESSION: 1. Small bilateral pleural effusions and bibasilar atelectasis or infiltrate. 2. Right upper lobe rounded opacity corresponding to the density seen on the prior CT. An additional 14 mm right upper lobe pulmonary nodule noted. Electronically Signed   By: Anner Crete M.D.   On: 09/03/2018 19:07

## 2018-09-10 NOTE — Progress Notes (Signed)
Inpatient Diabetes Program Recommendations  AACE/ADA: New Consensus Statement on Inpatient Glycemic Control (2015)  Target Ranges:  Prepandial:   less than 140 mg/dL      Peak postprandial:   less than 180 mg/dL (1-2 hours)      Critically ill patients:  140 - 180 mg/dL   Results for Backs, KOLETTE VEY (MRN [id number]) as of 09/10/2018 09:31  Ref. Range 09/09/2018 07:30 09/09/2018 11:24 09/09/2018 16:53 09/09/2018 21:05  Glucose-Capillary Latest Ref Range: 70 - 99 mg/dL 201 (H)  3 units NOVOLOG  129 (H)  1 unit NOVOLOG  151 (H)  2 units NOVOLOG  183 (H)   Results for Pettus, AZAYLIA FONG (MRN [id number]) as of 09/10/2018 09:31  Ref. Range 09/10/2018 07:38  Glucose-Capillary Latest Ref Range: 70 - 99 mg/dL 222 (H)  3 units NOVOLOG     Admit with: AMS/ Severe Anemia/ Severe thrombocytopenia  History: DM, Metastatic Breast Cancer  Home DM Meds: Levemir 10 units QHS       Novolog 8 units TID with meals (HOLD if NPO)       Metformin 500 mg Daily  Current Orders: Novolog Sensitive Correction Scale/ SSI (0-9 units) TID AC + HS      MD- Note AM CBGs have been elevated the last several days.  Please consider adding 50% home dose of Levemir:  Levemir 5 units QHS     --Will follow patient during hospitalization--  Wyn Quaker RN, MSN, CDE Diabetes Coordinator Inpatient Glycemic Control Team Team Pager: [phone] (8a-5p)

## 2018-09-10 NOTE — Progress Notes (Signed)
[facility]                                                                              Patient Demographics  Stacy Shelton, is a 58 y.o. female, DOB - [birth year], OIT:[id number]  Admit date - 08/30/2018   Admitting Physician Bonnell Public, MD  Outpatient Primary MD for the patient is Robyne Peers, MD  Outpatient specialists:   LOS - 3  days   Medical records reviewed and are as summarized below:    No chief complaint on file.      Brief summary   Patient is a 58 year old Caucasian female with past medical history of metastatic breast cancer, diabetes mellitus type 2, asthma was brought to hospital after altered mental status.  Found to be hypoglycemic with blood glucose 19.  She was started on broad-spectrum antibiotic for infectious etiology.  Also patient found to be severely anemic and thrombocytopenic.  Transfused  PRBC and platelets. Patient was hypotensive and required brief Levophed infusion on 8/10, off on 8/11  Assessment & Plan    Principal Problem:   Sepsis ([place]) secondary to UTI -Patient met sepsis criteria at the time of admission with low-grade fever, hypotension, tachycardia, tachypnea, hypoxia, source likely due to UTI -Briefly required vasopressors, Levophed weaned off on 8/11 -Urine culture showed enterococcus, sensitivities reviewed, change antibiotics to IV ampicillin  Active Problems: Acute enterococcus UTI -Urine culture showed more than 100,000 colonies of Enterococcus faecalis, initially started on broad-spectrum antibiotics on IV vancomycin and cefepime -Narrow antibiotics, de-escalate her to IV ampicillin per sensitivities  Pancytopenia with thrombocytopenia (HCC) -Likely due to bone marrow involvement and chemotherapy,, could have worsened now due to antibiotics - patient had presented with hemoglobin of 6.6, had received 3 units packed RBCs, also had received 1 unit of platelets -This morning platelets down to 7k again,  discussed with Dr Alvy Bimler, will transfuse 1 unit platelets pheresis    Uncontrolled diabetes mellitus with hyperglycemia (HCC) -Add Levemir 5 units nightly, continue sliding scale insulin -Diabetic coordinator consulted    Metastatic breast cancer ([place]) -Dr. Alvy Bimler following, continue aggressive supportive care now, not ready to resume chemotherapy  Multiple decubitus ulcers Continue wound care, needs to be more mobile and continue PT  Hypoalbuminemia, moderate degree malnutrition, severe deconditioning -Albumin 1.4, added pro-stat -Per nursing staff, oral intake remains poor, if patient does not take pro-stat, will place on albumin infusion in a.m.    Acute encephalopathy -Currently resolved likely due to acute UTI  History of mild diastolic dysfunction -2D echo 5/20 had shown EF of 60 to 65%, left ventricular diastolic function could not be ascertained -Closely follow volume status, I's and O's  Code Status: Full CODE STATUS DVT Prophylaxis:  SCD's Family Communication: Discussed in detail with the patient, all imaging results, lab results explained to the patient   Disposition Plan: High risk of deterioration with multiple medical problems, sepsis, UTI, profound thrombocytopenia, remains in stepdown today  Time Spent in minutes 35 minutes  Procedures:  None  Consultants:   Oncology, Dr. Alvy Bimler  Antimicrobials:   Anti-infectives (From admission, onward)   Start     Dose/Rate  Route Frequency Ordered Stop   09/10/18 1200  ampicillin (OMNIPEN) 1 g in sodium chloride 0.9 % 100 mL IVPB     1 g 300 mL/hr over 20 Minutes Intravenous Every 6 hours 09/10/18 0822     09/09/18 0300  vancomycin (VANCOCIN) IVPB 1000 mg/200 mL premix  Status:  Discontinued     1,000 mg 200 mL/hr over 60 Minutes Intravenous Daily 09/08/18 0323 09/10/18 0752   09/08/18 0600  ceFEPIme (MAXIPIME) 2 g in sodium chloride 0.9 % 100 mL IVPB  Status:  Discontinued     2 g 200 mL/hr over 30 Minutes  Intravenous Every 8 hours 09/08/18 0323 09/10/18 0752   09/14/2018 1900  ceFEPIme (MAXIPIME) 2 g in sodium chloride 0.9 % 100 mL IVPB     2 g 200 mL/hr over 30 Minutes Intravenous  Once 09/26/2018 1858 09/05/2018 2120   09/17/2018 1900  metroNIDAZOLE (FLAGYL) IVPB 500 mg     500 mg 100 mL/hr over 60 Minutes Intravenous  Once 09/19/2018 1858 09/20/2018 2228   09/15/2018 1900  vancomycin (VANCOCIN) IVPB 1000 mg/200 mL premix     1,000 mg 200 mL/hr over 60 Minutes Intravenous  Once 09/04/2018 1858 09/08/18 0221         Medications  Scheduled Meds: . sodium chloride   Intravenous Once  . amiodarone  200 mg Oral Daily  . budesonide (PULMICORT) nebulizer solution  0.25 mg Nebulization BID  . Chlorhexidine Gluconate Cloth  6 each Topical Daily  . feeding supplement (ENSURE ENLIVE)  237 mL Oral BID BM  . feeding supplement (GLUCERNA SHAKE)  237 mL Oral Q4H  . feeding supplement (PRO-STAT SUGAR FREE 64)  30 mL Oral BID  . insulin aspart  0-5 Units Subcutaneous QHS  . insulin aspart  0-9 Units Subcutaneous TID WC  . insulin detemir  5 Units Subcutaneous QHS  . mouth rinse  15 mL Mouth Rinse BID  . montelukast  10 mg Oral QHS  . multivitamin with minerals  1 tablet Oral Daily  . nutrition supplement (JUVEN)  1 packet Oral BID BM  . pantoprazole  40 mg Oral Daily  . sodium chloride flush  10-40 mL Intracatheter Q12H   Continuous Infusions: . ampicillin (OMNIPEN) IV     PRN Meds:.acetaminophen, albuterol, lidocaine-prilocaine, morphine, sodium chloride flush      Subjective:   Stacy Shelton was seen and examined today.  Very weak and deconditioned, poor appetite and oral intake.  Overnight no fevers, acute events.  Patient denies dizziness, chest pain, shortness of breath, abdominal pain, N/V/D/C.  Objective:   Vitals:   09/10/18 1020 09/10/18 1024 09/10/18 1039 09/10/18 1042  BP: (!) 117/36 (!) 117/36 (!) 114/28 (!) 124/40  Pulse: (!) 106 (!) 108 (!) 101   Resp: (!) 33 (!) 33 (!) 38   Temp:   98.7 F (37.1 C) 98.2 F (36.8 C)   TempSrc:  Oral Oral   SpO2: 100% 100% 100% 100%  Weight:      Height:        Intake/Output Summary (Last 24 hours) at 09/10/2018 1153 Last data filed at 09/10/2018 0300 Gross per 24 hour  Intake 924.83 ml  Output -  Net 924.83 ml     Wt Readings from Last 3 Encounters:  09/08/18 57.4 kg  08/06/18 55.8 kg  07/28/18 55.8 kg     Exam  General: Alert and oriented x 3, NAD  Eyes:   HEENT:  Atraumatic, normocephalic  Cardiovascular: S1 S2 auscultated. Regular  rate and rhythm.  Respiratory: Clear to auscultation bilaterally, no wheezing, rales or rhonchi  Gastrointestinal: Soft, nontender, nondistended, + bowel sounds  Ext: Multiple dressings on the extremities  Neuro: No new deficits  Musculoskeletal: No digital cyanosis, clubbing  Skin: No rashes  Psych: Flat affect   Data Reviewed:  I have personally reviewed following labs and imaging studies  Micro Results Recent Results (from the past 240 hour(s))  Culture, blood (routine x 2)     Status: None (Preliminary result)   Collection Time: 09/11/2018  6:06 PM   Specimen: BLOOD  Result Value Ref Range Status   Specimen Description   Final    BLOOD PICC LINE Performed at [facility] [address] [place], [place] [address]    Special Requests   Final    BOTTLES DRAWN AEROBIC AND ANAEROBIC Blood Culture adequate volume Performed at [facility] [address] [place], [place] [address]    Culture   Final    NO GROWTH 2 DAYS Performed at [facility] [address], [place], [place] [address]    Report Status PENDING  Incomplete  SARS Coronavirus 2 [facility] order, Performed in [facility] hospital lab) Nasopharyngeal Nasopharyngeal Swab     Status: None   Collection Time: 09/13/2018  6:11 PM   Specimen: Nasopharyngeal Swab  Result Value Ref Range Status   SARS Coronavirus 2 NEGATIVE NEGATIVE Final    Comment: (NOTE) If  result is NEGATIVE SARS-CoV-2 target nucleic acids are NOT DETECTED. The SARS-CoV-2 RNA is generally detectable in upper and lower  respiratory specimens during the acute phase of infection. The lowest  concentration of SARS-CoV-2 viral copies this assay can detect is 250  copies / mL. A negative result does not preclude SARS-CoV-2 infection  and should not be used as the sole basis for treatment or other  patient management decisions.  A negative result may occur with  improper specimen collection / handling, submission of specimen other  than nasopharyngeal swab, presence of viral mutation(s) within the  areas targeted by this assay, and inadequate number of viral copies  (<250 copies / mL). A negative result must be combined with clinical  observations, patient history, and epidemiological information. If result is POSITIVE SARS-CoV-2 target nucleic acids are DETECTED. The SARS-CoV-2 RNA is generally detectable in upper and lower  respiratory specimens dur ing the acute phase of infection.  Positive  results are indicative of active infection with SARS-CoV-2.  Clinical  correlation with patient history and other diagnostic information is  necessary to determine patient infection status.  Positive results do  not rule out bacterial infection or co-infection with other viruses. If result is PRESUMPTIVE POSTIVE SARS-CoV-2 nucleic acids MAY BE PRESENT.   A presumptive positive result was obtained on the submitted specimen  and confirmed on repeat testing.  While 2019 novel coronavirus  (SARS-CoV-2) nucleic acids may be present in the submitted sample  additional confirmatory testing may be necessary for epidemiological  and / or clinical management purposes  to differentiate between  SARS-CoV-2 and other Sarbecovirus currently known to infect humans.  If clinically indicated additional testing with an alternate test  methodology [phone]) is advised. The SARS-CoV-2 RNA is generally   detectable in upper and lower respiratory sp ecimens during the acute  phase of infection. The expected result is Negative. Fact Sheet for Patients:  StrictlyIdeas.no Fact Sheet for Healthcare Providers: BankingDealers.co.za This test is not yet approved or cleared by the [place]  States FDA and has been authorized for detection and/or diagnosis of SARS-CoV-2 by FDA under an Emergency Use Authorization (EUA).  This EUA will remain in effect (meaning this test can be used) for the duration of the COVID-19 declaration under Section 564(b)(1) of the Act, 21 U.S.C. section 360bbb-3(b)(1), unless the authorization is terminated or revoked sooner. Performed at [facility], [place] [address] [place], [place] [address]   MRSA PCR Screening     Status: None   Collection Time: 09/11/2018 11:25 PM   Specimen: Nasal Mucosa; Nasopharyngeal  Result Value Ref Range Status   MRSA by PCR NEGATIVE NEGATIVE Final    Comment:        The GeneXpert MRSA Assay (FDA approved for NASAL specimens only), is one component of a comprehensive MRSA colonization surveillance program. It is not intended to diagnose MRSA infection nor to guide or monitor treatment for MRSA infections. Performed at [facility], [place] [address], [place], [place] [address]   Culture, blood (routine x 2)     Status: None (Preliminary result)   Collection Time: 09/08/18 12:31 AM   Specimen: BLOOD LEFT HAND  Result Value Ref Range Status   Specimen Description   Final    BLOOD LEFT HAND Performed at [facility], [place] [address] [place], [place] [address]    Special Requests   Final    BOTTLES DRAWN AEROBIC ONLY Blood Culture results may not be optimal due to an inadequate volume of blood received in culture bottles Performed at [facility] [address] [place] [address], [place], [place] [address]    Culture   Final    NO GROWTH 1 DAY  Performed at [facility], [place] [address] [place], [place] [address]    Report Status PENDING  Incomplete  Urine culture     Status: Abnormal   Collection Time: 09/08/18  2:55 AM   Specimen: Urine, Random  Result Value Ref Range Status   Specimen Description   Final    URINE, RANDOM Performed at [facility] [address], [place], [place] [address]    Special Requests   Final    NONE Performed at [facility], [place] [address], [place], [place] [address]    Culture >=100,000 COLONIES/mL ENTEROCOCCUS FAECALIS (A)  Final   Report Status 09/10/2018 FINAL  Final   Organism ID, Bacteria ENTEROCOCCUS FAECALIS (A)  Final      Susceptibility   Enterococcus faecalis - MIC*    AMPICILLIN <=2 SENSITIVE Sensitive     LEVOFLOXACIN 2 SENSITIVE Sensitive     NITROFURANTOIN <=16 SENSITIVE Sensitive     VANCOMYCIN 1 SENSITIVE Sensitive     * >=100,000 COLONIES/mL ENTEROCOCCUS FAECALIS    Radiology Reports Dg Chest Portable 1 View  Result Date: 09/12/2018 CLINICAL DATA:  58 year old female with weakness. History of metastatic breast cancer. EXAM: PORTABLE CHEST 1 VIEW COMPARISON:  Chest radiograph dated 06/22/2018 and CT dated 06/11/2018 FINDINGS: There is shallow inspiration with bibasilar atelectasis. A 4.4 cm rounded opacity in the right upper lobe corresponds to density seen on the prior CT and may represent metastatic disease or primary lung cancer. Additional smaller 14 mm nodular density noted in the right upper lobe more laterally. There is probable small bilateral pleural effusions. No pneumothorax. There is mild cardiomegaly. Atherosclerotic calcification of aortic arch. Right sided Port-A-Cath with tip in the region of the cavoatrial junction. No acute osseous pathology. IMPRESSION: 1. Small bilateral pleural  effusions and bibasilar atelectasis or infiltrate. 2. Right upper lobe rounded opacity corresponding to the density seen on the prior CT. An  additional 14 mm right upper lobe pulmonary nodule noted. Electronically Signed   By: Anner Crete M.D.   On: 09/05/2018 19:07    Lab Data:  CBC: Recent Labs  Lab 09/10/2018 1805 09/08/18 0031 09/08/18 9396 09/08/18 0806 09/08/18 2222 09/09/18 0525 09/10/18 0511  WBC 5.8 3.6*  --  3.6*  --  5.3 3.5*  NEUTROABS 3.6  --   --   --   --   --   --   HGB 6.6* 7.2* 6.6* 6.8* 9.5* 9.4* 9.5*  HCT 21.0* 22.6* 20.7* 21.0* 28.6* 28.9* 28.6*  MCV 90.5 89.7  --  86.1  --  86.5 86.1  PLT 5* 5*  --  15*  --  12* 7*   Basic Metabolic Panel: Recent Labs  Lab 09/27/2018 1858 09/08/18 0031 09/09/18 0525 09/10/18 0511  NA 128* 133* 130* 130*  K 3.5 4.2 3.0* 3.5  CL 94* 98 98 99  CO2 24 24 24 24   GLUCOSE 214* 118* 250* 211*  BUN 12 11 12 11   CREATININE 0.49 0.48 0.43* 0.34*  CALCIUM 7.3* 7.8* 7.3* 7.5*  MG  --  1.7  --   --   PHOS  --  3.6  --   --    GFR: Estimated Creatinine Clearance: 60.6 mL/min (A) (by C-G formula based on SCr of 0.34 mg/dL (L)). Liver Function Tests: Recent Labs  Lab 09/01/2018 1858 09/09/18 0525 09/10/18 0511  AST 23 13* 24  ALT 16 14 25   ALKPHOS 176* 183* 353*  BILITOT 1.8* 2.2* 1.8*  PROT 5.5* 5.3* 5.3*  ALBUMIN 1.7* 1.6* 1.4*   No results for input(s): LIPASE, AMYLASE in the last 168 hours. No results for input(s): AMMONIA in the last 168 hours. Coagulation Profile: Recent Labs  Lab 09/17/2018 1858  INR 1.5*   Cardiac Enzymes: No results for input(s): CKTOTAL, CKMB, CKMBINDEX, TROPONINI in the last 168 hours. BNP (last 3 results) No results for input(s): PROBNP in the last 8760 hours. HbA1C: No results for input(s): HGBA1C in the last 72 hours. CBG: Recent Labs  Lab 09/09/18 0730 09/09/18 1124 09/09/18 1653 09/09/18 2105 09/10/18 0738  GLUCAP 201* 129* 151* 183* 222*   Lipid Profile: No results for input(s): CHOL, HDL, LDLCALC, TRIG, CHOLHDL, LDLDIRECT in the last 72 hours. Thyroid Function Tests: Recent Labs    09/08/18 0031  TSH  2.407   Anemia Panel: No results for input(s): VITAMINB12, FOLATE, FERRITIN, TIBC, IRON, RETICCTPCT in the last 72 hours. Urine analysis:    Component Value Date/Time   COLORURINE AMBER (A) 09/08/2018 0255   APPEARANCEUR HAZY (A) 09/08/2018 0255   LABSPEC 1.016 09/08/2018 0255   PHURINE 6.0 09/08/2018 0255   GLUCOSEU NEGATIVE 09/08/2018 0255   HGBUR NEGATIVE 09/08/2018 0255   BILIRUBINUR NEGATIVE 09/08/2018 0255   KETONESUR NEGATIVE 09/08/2018 0255   PROTEINUR NEGATIVE 09/08/2018 0255   NITRITE POSITIVE (A) 09/08/2018 0255   LEUKOCYTESUR NEGATIVE 09/08/2018 0255     Laken Lobato M.D. [facility] 09/10/2018, 11:53 AM  Pager: [phone] Between 7am to 7pm - call Pager - [phone]  After 7pm go to [email or web address] - password [email or web address]  Call night coverage person covering after 7pm

## 2018-09-10 NOTE — Progress Notes (Signed)
Left upper extremity venous duplex completed. Preliminary results in Chart review CV Proc. [place],RVS 09/10/2018, 4:23 PM

## 2018-09-11 ENCOUNTER — Inpatient Hospital Stay (HOSPITAL_COMMUNITY): Payer: BC Managed Care – PPO

## 2018-09-11 LAB — GLUCOSE, CAPILLARY
Glucose-Capillary: 102 mg/dL — ABNORMAL HIGH (ref 70–99)
Glucose-Capillary: 110 mg/dL — ABNORMAL HIGH (ref 70–99)
Glucose-Capillary: 168 mg/dL — ABNORMAL HIGH (ref 70–99)
Glucose-Capillary: 209 mg/dL — ABNORMAL HIGH (ref 70–99)

## 2018-09-11 LAB — CBC
HCT: 24.1 % — ABNORMAL LOW (ref 36.0–46.0)
Hemoglobin: 7.8 g/dL — ABNORMAL LOW (ref 12.0–15.0)
MCH: 28.4 pg (ref 26.0–34.0)
MCHC: 32.4 g/dL (ref 30.0–36.0)
MCV: 87.6 fL (ref 80.0–100.0)
Platelets: 6 10*3/uL — CL (ref 150–400)
RBC: 2.75 MIL/uL — ABNORMAL LOW (ref 3.87–5.11)
RDW: 16.8 % — ABNORMAL HIGH (ref 11.5–15.5)
WBC: 2.2 10*3/uL — ABNORMAL LOW (ref 4.0–10.5)
nRBC: 0 % (ref 0.0–0.2)

## 2018-09-11 LAB — CBC WITH DIFFERENTIAL/PLATELET
Abs Immature Granulocytes: 0.33 10*3/uL — ABNORMAL HIGH (ref 0.00–0.07)
Basophils Absolute: 0.2 10*3/uL — ABNORMAL HIGH (ref 0.0–0.1)
Basophils Relative: 6 %
Eosinophils Absolute: 0 10*3/uL (ref 0.0–0.5)
Eosinophils Relative: 0 %
HCT: 29 % — ABNORMAL LOW (ref 36.0–46.0)
Hemoglobin: 9.5 g/dL — ABNORMAL LOW (ref 12.0–15.0)
Immature Granulocytes: 11 %
Lymphocytes Relative: 23 %
Lymphs Abs: 0.7 10*3/uL (ref 0.7–4.0)
MCH: 29.2 pg (ref 26.0–34.0)
MCHC: 32.8 g/dL (ref 30.0–36.0)
MCV: 89.2 fL (ref 80.0–100.0)
Monocytes Absolute: 0.2 10*3/uL (ref 0.1–1.0)
Monocytes Relative: 8 %
Neutro Abs: 1.5 10*3/uL — ABNORMAL LOW (ref 1.7–7.7)
Neutrophils Relative %: 52 %
Platelets: 9 10*3/uL — CL (ref 150–400)
RBC: 3.25 MIL/uL — ABNORMAL LOW (ref 3.87–5.11)
RDW: 15.9 % — ABNORMAL HIGH (ref 11.5–15.5)
WBC: 2.9 10*3/uL — ABNORMAL LOW (ref 4.0–10.5)
nRBC: 0 % (ref 0.0–0.2)

## 2018-09-11 LAB — BASIC METABOLIC PANEL
Anion gap: 7 (ref 5–15)
BUN: 12 mg/dL (ref 6–20)
CO2: 25 mmol/L (ref 22–32)
Calcium: 7.7 mg/dL — ABNORMAL LOW (ref 8.9–10.3)
Chloride: 100 mmol/L (ref 98–111)
Creatinine, Ser: 0.37 mg/dL — ABNORMAL LOW (ref 0.44–1.00)
GFR calc Af Amer: >60 mL/min (ref >60)
GFR calc non Af Amer: >60 mL/min (ref >60)
Glucose, Bld: 99 mg/dL (ref 70–99)
Potassium: 3.1 mmol/L — ABNORMAL LOW (ref 3.5–5.1)
Sodium: 132 mmol/L — ABNORMAL LOW (ref 135–145)

## 2018-09-11 LAB — PREPARE PLATELET PHERESIS: Unit division: 0

## 2018-09-11 LAB — BPAM PLATELET PHERESIS
Blood Product Expiration Date: 202008122359
ISSUE DATE / TIME: 202008121016
Unit Type and Rh: 5100

## 2018-09-11 LAB — PREPARE RBC (CROSSMATCH)

## 2018-09-11 MED ORDER — PRO-STAT SUGAR FREE PO LIQD
30.0000 mL | Freq: Three times a day (TID) | ORAL | Status: DC
  Administered 2018-09-11 – 2018-09-15 (×8): 30 mL via ORAL
  Filled 2018-09-11 (×8): qty 30

## 2018-09-11 MED ORDER — SODIUM CHLORIDE 0.9% IV SOLUTION
Freq: Once | INTRAVENOUS | Status: AC
  Administered 2018-09-11: 11:00:00 via INTRAVENOUS

## 2018-09-11 MED ORDER — DIPHENHYDRAMINE HCL 25 MG PO CAPS
25.0000 mg | ORAL_CAPSULE | Freq: Once | ORAL | Status: AC
  Administered 2018-09-11: 25 mg via ORAL
  Filled 2018-09-11: qty 1

## 2018-09-11 MED ORDER — POTASSIUM CHLORIDE CRYS ER 20 MEQ PO TBCR
40.0000 meq | EXTENDED_RELEASE_TABLET | Freq: Once | ORAL | Status: AC
  Administered 2018-09-11: 40 meq via ORAL
  Filled 2018-09-11: qty 2

## 2018-09-11 MED ORDER — SODIUM CHLORIDE 0.9% IV SOLUTION: Freq: Once | INTRAVENOUS | Status: DC

## 2018-09-11 MED ORDER — POTASSIUM CHLORIDE 20 MEQ PO PACK
40.0000 meq | PACK | Freq: Once | ORAL | Status: AC
  Administered 2018-09-11: 40 meq via ORAL
  Filled 2018-09-11: qty 2

## 2018-09-11 MED ORDER — ACETAMINOPHEN 325 MG PO TABS
650.0000 mg | ORAL_TABLET | Freq: Once | ORAL | Status: AC
  Administered 2018-09-11: 650 mg via ORAL
  Filled 2018-09-11: qty 2

## 2018-09-11 NOTE — Progress Notes (Signed)
PT Cancellation Note  Patient Details Name: Stacy Shelton MRN: [id number] DOB: Nov 18, 1960   Cancelled Treatment:    Reason Eval/Treat Not Completed: Medical issues which prohibited therapy - Platelet count of 6, will be getting platelet transfusion today. Will check back as schedule allows.  Julien Girt, PT Acute Rehabilitation Services Pager [phone]  Office [phone]    [place] 09/11/2018, 11:12 AM

## 2018-09-11 NOTE — Progress Notes (Signed)
[facility]                                                                              Patient Demographics  Stacy Shelton, is a 58 y.o. female, DOB - 1960-07-22, UGQ:[id number]  Admit date - 09/06/2018   Admitting Physician Bonnell Public, MD  Outpatient Primary MD for the patient is Robyne Peers, MD  Outpatient specialists:   LOS - 4  days   Medical records reviewed and are as summarized below:    No chief complaint on file.      Brief summary   Patient is a 58 year old Caucasian female with past medical history of metastatic breast cancer, diabetes mellitus type 2, asthma was brought to hospital after altered mental status.  Found to be hypoglycemic with blood glucose 19.  She was started on broad-spectrum antibiotic for infectious etiology.  Also patient found to be severely anemic and thrombocytopenic.  Transfused  PRBC and platelets. Patient was hypotensive and required brief Levophed infusion on 8/10, off on 8/11  Assessment & Plan    Principal Problem:   Sepsis ([place]) secondary to UTI with septic shock -Patient met sepsis criteria at the time of admission with low-grade fever, hypotension, tachycardia, tachypnea, hypoxia, source likely due to UTI. -Briefly required vasopressors, Levophed weaned off on 8/11 -Urine culture showed enterococcus, continue IV ampicillin total 7 days  Active Problems: Acute enterococcus UTI -Urine culture showed more than 100,000 colonies of Enterococcus faecalis, initially started on broad-spectrum antibiotics on IV vancomycin and cefepime -Continue IV ampicillin, total 7 days including IV vancomycin received   Pancytopenia with thrombocytopenia (HCC) -Likely due to bone marrow involvement and chemotherapy,, could have worsened now due to antibiotics - patient had presented with hemoglobin of 6.6, had received 3 units packed RBCs, also had received 1 unit of platelets, received 1 unit Plt on 8/12 -Hemoglobin 7.8,  platelets 6K, will transfuse 1 unit platelets pheresis, 1 unit packed RBC    Uncontrolled diabetes mellitus with hyperglycemia (HCC) -Continue Levemir 5 units nightly, continue sliding scale insulin -Diabetic coordinator consulted    Metastatic breast cancer ([place]) -Dr. Alvy Bimler following, continue aggressive supportive care now, not ready to resume chemotherapy  Hypoalbuminemia, moderate degree malnutrition, severe deconditioning -Albumin 1.4, added pro-stat    Acute encephalopathy -Intermittent waxing and waning mental status, possibly due to acute UTI, critical illness -Due to profound thrombocytopenia, CT head obtained to rule out any bleed, negative  History of mild diastolic dysfunction -2D echo 5/20 had shown EF of 60 to 65%, left ventricular diastolic function could not be ascertained -2.6 L +, continue to monitor I's and O's  Multiple decub ulcers -Per nursing documentation Bilateral Ear pressure injuries, Stage 1 (POA); Upper back pressure injury Stage 2, (POA); Sacral pressure injury, unstageable (POA); Left Heel pressure injury, unstageable (POA); Right thigh pressure injury, unstagable (POA) -Agree, continue wound care, continue PT    Code Status: Full CODE STATUS DVT Prophylaxis:  SCD's Family Communication: Discussed in detail with the patient, all imaging results, lab results explained to the patient and patient's sister [name] on the phone   Disposition Plan: High risk of deterioration  with multiple medical problems, sepsis, UTI, profound thrombocytopenia, remains in stepdown today  Time Spent in minutes 35 minutes  Procedures:  None  Consultants:   Oncology, Dr. Alvy Bimler  Antimicrobials:   Anti-infectives (From admission, onward)   Start     Dose/Rate Route Frequency Ordered Stop   09/10/18 1200  ampicillin (OMNIPEN) 1 g in sodium chloride 0.9 % 100 mL IVPB     1 g 300 mL/hr over 20 Minutes Intravenous Every 6 hours 09/10/18 0822     09/09/18 0300   vancomycin (VANCOCIN) IVPB 1000 mg/200 mL premix  Status:  Discontinued     1,000 mg 200 mL/hr over 60 Minutes Intravenous Daily 09/08/18 0323 09/10/18 0752   09/08/18 0600  ceFEPIme (MAXIPIME) 2 g in sodium chloride 0.9 % 100 mL IVPB  Status:  Discontinued     2 g 200 mL/hr over 30 Minutes Intravenous Every 8 hours 09/08/18 0323 09/10/18 0752   08/30/2018 1900  ceFEPIme (MAXIPIME) 2 g in sodium chloride 0.9 % 100 mL IVPB     2 g 200 mL/hr over 30 Minutes Intravenous  Once 09/17/2018 1858 09/12/2018 2120   09/14/2018 1900  metroNIDAZOLE (FLAGYL) IVPB 500 mg     500 mg 100 mL/hr over 60 Minutes Intravenous  Once 09/25/2018 1858 09/22/2018 2228   09/27/2018 1900  vancomycin (VANCOCIN) IVPB 1000 mg/200 mL premix     1,000 mg 200 mL/hr over 60 Minutes Intravenous  Once 09/27/2018 1858 09/08/18 0221         Medications  Scheduled Meds:  sodium chloride   Intravenous Once   amiodarone  200 mg Oral Daily   budesonide (PULMICORT) nebulizer solution  0.25 mg Nebulization BID   Chlorhexidine Gluconate Cloth  6 each Topical Daily   feeding supplement (GLUCERNA SHAKE)  237 mL Oral Q4H   feeding supplement (PRO-STAT SUGAR FREE 64)  30 mL Oral TID   insulin aspart  0-5 Units Subcutaneous QHS   insulin aspart  0-9 Units Subcutaneous TID WC   insulin detemir  5 Units Subcutaneous QHS   mouth rinse  15 mL Mouth Rinse BID   montelukast  10 mg Oral QHS   multivitamin with minerals  1 tablet Oral Daily   nutrition supplement (JUVEN)  1 packet Oral BID BM   pantoprazole  40 mg Oral Daily   sodium chloride flush  10-40 mL Intracatheter Q12H   Continuous Infusions:  ampicillin (OMNIPEN) IV Stopped (09/11/18 1257)   PRN Meds:.acetaminophen, albuterol, lidocaine-prilocaine, morphine, sodium chloride flush      Subjective:   Stacy Shelton was seen and examined today.  Weak and deconditioned, borderline BP.  Waxing and waning mental status, fairly clear at the time of my encounter.  Overnight no  acute events.   Patient denies dizziness, chest pain, shortness of breath, abdominal pain, N/V/D/C.  Objective:   Vitals:   09/11/18 0857 09/11/18 1044 09/11/18 1120 09/11/18 1200  BP:  (!) 123/29 (!) 99/39 (!) 100/38  Pulse:  (!) 111 (!) 108 100  Resp:  (!) 26 (!) 28 (!) 31  Temp:  98.8 F (37.1 C) 98.8 F (37.1 C) 98 F (36.7 C)  TempSrc:  Oral Oral Oral  SpO2: 99% 100% 100% 100%  Weight:      Height:        Intake/Output Summary (Last 24 hours) at 09/11/2018 1337 Last data filed at 09/11/2018 1307 Gross per 24 hour  Intake 605.5 ml  Output 1050 ml  Net -444.5 ml  Wt Readings from Last 3 Encounters:  09/08/18 57.4 kg  08/06/18 55.8 kg  07/28/18 55.8 kg    Physical Exam  General: Alert and oriented x 3, frail and deconditioned  Eyes:  HEENT:  Atraumatic, normocephalic  Cardiovascular: S1 S2 clear, no murmurs, RRR. 1+ pedal edema b/l  Respiratory: CTAB, no wheezing, rales or rhonchi  Gastrointestinal: Soft, nontender, nondistended, NBS  Ext: 1+ pedal edema bilaterally  Neuro: no new deficits  Musculoskeletal: No cyanosis, clubbing  Skin: No rashes  Psych: Normal affect and demeanor, alert and oriented x3    Data Reviewed:  I have personally reviewed following labs and imaging studies  Micro Results Recent Results (from the past 240 hour(s))  Culture, blood (routine x 2)     Status: None (Preliminary result)   Collection Time: 09/12/2018  6:06 PM   Specimen: BLOOD  Result Value Ref Range Status   Specimen Description   Final    BLOOD PICC LINE Performed at [facility] [address] [place], [place] [address]    Special Requests   Final    BOTTLES DRAWN AEROBIC AND ANAEROBIC Blood Culture adequate volume Performed at [facility] [address] [place], [place] [address]    Culture   Final    NO GROWTH 4 DAYS Performed at [facility], [place] [address] [place],  [address]    Report  Status PENDING  Incomplete  SARS Coronavirus 2 [facility] order, Performed in [facility] hospital lab) Nasopharyngeal Nasopharyngeal Swab     Status: None   Collection Time: 08/31/2018  6:11 PM   Specimen: Nasopharyngeal Swab  Result Value Ref Range Status   SARS Coronavirus 2 NEGATIVE NEGATIVE Final    Comment: (NOTE) If result is NEGATIVE SARS-CoV-2 target nucleic acids are NOT DETECTED. The SARS-CoV-2 RNA is generally detectable in upper and lower  respiratory specimens during the acute phase of infection. The lowest  concentration of SARS-CoV-2 viral copies this assay can detect is 250  copies / mL. A negative result does not preclude SARS-CoV-2 infection  and should not be used as the sole basis for treatment or other  patient management decisions.  A negative result may occur with  improper specimen collection / handling, submission of specimen other  than nasopharyngeal swab, presence of viral mutation(s) within the  areas targeted by this assay, and inadequate number of viral copies  (<250 copies / mL). A negative result must be combined with clinical  observations, patient history, and epidemiological information. If result is POSITIVE SARS-CoV-2 target nucleic acids are DETECTED. The SARS-CoV-2 RNA is generally detectable in upper and lower  respiratory specimens dur ing the acute phase of infection.  Positive  results are indicative of active infection with SARS-CoV-2.  Clinical  correlation with patient history and other diagnostic information is  necessary to determine patient infection status.  Positive results do  not rule out bacterial infection or co-infection with other viruses. If result is PRESUMPTIVE POSTIVE SARS-CoV-2 nucleic acids MAY BE PRESENT.   A presumptive positive result was obtained on the submitted specimen  and confirmed on repeat testing.  While 2019 novel coronavirus  (SARS-CoV-2) nucleic acids may be present in the submitted sample  additional  confirmatory testing may be necessary for epidemiological  and / or clinical management purposes  to differentiate between  SARS-CoV-2 and other Sarbecovirus currently known to infect humans.  If clinically indicated additional testing with an alternate test  methodology [phone]) is advised.  The SARS-CoV-2 RNA is generally  detectable in upper and lower respiratory sp ecimens during the acute  phase of infection. The expected result is Negative. Fact Sheet for Patients:  StrictlyIdeas.no Fact Sheet for Healthcare Providers: BankingDealers.co.za This test is not yet approved or cleared by the [place] FDA and has been authorized for detection and/or diagnosis of SARS-CoV-2 by FDA under an Emergency Use Authorization (EUA).  This EUA will remain in effect (meaning this test can be used) for the duration of the COVID-19 declaration under Section 564(b)(1) of the Act, 21 U.S.C. section 360bbb-3(b)(1), unless the authorization is terminated or revoked sooner. Performed at [facility], [place] [address] [place], [place] [address]   MRSA PCR Screening     Status: None   Collection Time: 08/30/2018 11:25 PM   Specimen: Nasal Mucosa; Nasopharyngeal  Result Value Ref Range Status   MRSA by PCR NEGATIVE NEGATIVE Final    Comment:        The GeneXpert MRSA Assay (FDA approved for NASAL specimens only), is one component of a comprehensive MRSA colonization surveillance program. It is not intended to diagnose MRSA infection nor to guide or monitor treatment for MRSA infections. Performed at [facility], [place] [address], [place], [place] [address]   Culture, blood (routine x 2)     Status: None (Preliminary result)   Collection Time: 09/08/18 12:31 AM   Specimen: BLOOD LEFT HAND  Result Value Ref Range Status   Specimen Description   Final    BLOOD LEFT HAND Performed at [facility], [place] [address], [place], [place] [address]    Special Requests   Final    BOTTLES DRAWN AEROBIC ONLY Blood Culture results may not be optimal due to an inadequate volume of blood received in culture bottles Performed at [facility] [address], [place], [place] [address]    Culture   Final    NO GROWTH 3 DAYS Performed at [facility], [place] [address], [place], [place] [address]    Report Status PENDING  Incomplete  Urine culture     Status: Abnormal   Collection Time: 09/08/18  2:55 AM   Specimen: Urine, Random  Result Value Ref Range Status   Specimen Description   Final    URINE, RANDOM Performed at [facility] [address], [place], [place] [address]    Special Requests   Final    NONE Performed at [facility], [place] [address] [place], [place] [address]    Culture >=100,000 COLONIES/mL ENTEROCOCCUS FAECALIS (A)  Final   Report Status 09/10/2018 FINAL  Final   Organism ID, Bacteria ENTEROCOCCUS FAECALIS (A)  Final      Susceptibility   Enterococcus faecalis - MIC*    AMPICILLIN <=2 SENSITIVE Sensitive     LEVOFLOXACIN 2 SENSITIVE Sensitive     NITROFURANTOIN <=16 SENSITIVE Sensitive     VANCOMYCIN 1 SENSITIVE Sensitive     * >=100,000 COLONIES/mL ENTEROCOCCUS FAECALIS    Radiology Reports Ct Head Wo Contrast  Result Date: 09/11/2018 CLINICAL DATA:  Altered level of consciousness. Metastatic breast cancer. EXAM: CT HEAD WITHOUT CONTRAST TECHNIQUE: Contiguous axial images were obtained from the base of the skull through the vertex without intravenous contrast. COMPARISON:  CT head 04/08/2018, MRI head 07/17/2018 FINDINGS: Brain: Previously noted edema in the right occipital lobe has resolved. No new area of edema or hemorrhage. Mild atrophy appears to have progressed. Mild  patchy white matter hypodensity also has progressed. Question brain radiation. Vascular: Negative for hyperdense vessel Skull: Negative  Sinuses/Orbits: Prior sinus surgery. Mucoperiosteal thickening in the maxillary sinus bilaterally. Negative orbit. Other: None IMPRESSION: 1. Interval resolution of edema in the right occipital lobe related to metastatic disease on prior studies. Current study was done without intravenous contrast and is not adequate to evaluate for residual or recurrent metastatic disease. 2. Progression of atrophy and white matter changes since the prior study. Question related to brain radiation. Electronically Signed   By: Franchot Gallo M.D.   On: 09/11/2018 12:04   Dg Chest Portable 1 View  Result Date: 09/12/2018 CLINICAL DATA:  58 year old female with weakness. History of metastatic breast cancer. EXAM: PORTABLE CHEST 1 VIEW COMPARISON:  Chest radiograph dated 06/22/2018 and CT dated 06/11/2018 FINDINGS: There is shallow inspiration with bibasilar atelectasis. A 4.4 cm rounded opacity in the right upper lobe corresponds to density seen on the prior CT and may represent metastatic disease or primary lung cancer. Additional smaller 14 mm nodular density noted in the right upper lobe more laterally. There is probable small bilateral pleural effusions. No pneumothorax. There is mild cardiomegaly. Atherosclerotic calcification of aortic arch. Right sided Port-A-Cath with tip in the region of the cavoatrial junction. No acute osseous pathology. IMPRESSION: 1. Small bilateral pleural effusions and bibasilar atelectasis or infiltrate. 2. Right upper lobe rounded opacity corresponding to the density seen on the prior CT. An additional 14 mm right upper lobe pulmonary nodule noted. Electronically Signed   By: Anner Crete M.D.   On: 09/06/2018 19:07   Vas [place] Upper Extremity Venous Duplex  Result Date: 09/11/2018 UPPER VENOUS STUDY  Indications: Pain, Swelling, and Severe bruising Limitations: Unable to position the patient for optimal imaging. Comparison Study: No previous study available for comparison Performing  Technologist: Toma Copier RVS  Examination Guidelines: A complete evaluation includes B-mode imaging, spectral Doppler, color Doppler, and power Doppler as needed of all accessible portions of each vessel. Bilateral testing is considered an integral part of a complete examination. Limited examinations for reoccurring indications may be performed as noted.  Right Findings: +----------+------------+---------+-----------+----------+--------------+  RIGHT      Compressible Phasicity Spontaneous Properties    Summary      +----------+------------+---------+-----------+----------+--------------+  IJV                                                      Not visualized  +----------+------------+---------+-----------+----------+--------------+  Subclavian                                               Not visualized  +----------+------------+---------+-----------+----------+--------------+ Unable to Visulize the IJV or subclavian due to position of the patient and neck  Left Findings: +----------+------------+---------+-----------+----------+--------------------+  LEFT       Compressible Phasicity Spontaneous Properties       Summary         +----------+------------+---------+-----------+----------+--------------------+  IJV            Full        Yes        Yes                    Difficult to  visualize        +----------+------------+---------+-----------+----------+--------------------+  Subclavian                 Yes        Yes                    Difficult to                                                                     visualize        +----------+------------+---------+-----------+----------+--------------------+  Axillary       Full                                                            +----------+------------+---------+-----------+----------+--------------------+  Brachial       Full        Yes        Yes                                       +----------+------------+---------+-----------+----------+--------------------+  Radial         Full                                       Difficult to image   +----------+------------+---------+-----------+----------+--------------------+  Ulnar          Full                                       Difficult to image   +----------+------------+---------+-----------+----------+--------------------+  Cephalic       Full                                                            +----------+------------+---------+-----------+----------+--------------------+  Basilic        Full                                                            +----------+------------+---------+-----------+----------+--------------------+  Summary:  Right: Unable to visualize the IJV and subclavian due to position of the patient and neck.  Left: This was a limited study. There is no obvious evidence of DVT. However limited visualization due to sub optimal postioning of the arm due to pain.  *See table(s) above for measurements and observations.  Diagnosing physician: Curt Jews MD Electronically signed by Curt Jews MD on 09/11/2018 at 7:12:31 AM.    Final     Lab Data:  CBC: Recent Labs  Lab 09/05/2018 1805  09/08/18 0806 09/08/18 2222 09/09/18 0525 09/10/18 0511 09/10/18 1522 09/11/18 0306  WBC 5.8   < > 3.6*  --  5.3 3.5* 3.1* 2.2*  NEUTROABS 3.6  --   --   --   --   --  1.6*  --   HGB 6.6*   < > 6.8* 9.5* 9.4* 9.5* 9.0* 7.8*  HCT 21.0*   < > 21.0* 28.6* 28.9* 28.6* 27.0* 24.1*  MCV 90.5   < > 86.1  --  86.5 86.1 86.8 87.6  PLT 5*   < > 15*  --  12* 7* 10* 6*   < > = values in this interval not displayed.   Basic Metabolic Panel: Recent Labs  Lab 09/14/2018 1858 09/08/18 0031 09/09/18 0525 09/10/18 0511 09/11/18 0537  NA 128* 133* 130* 130* 132*  K 3.5 4.2 3.0* 3.5 3.1*  CL 94* 98 98 99 100  CO2 24 24 24 24 25   GLUCOSE 214* 118* 250* 211* 99  BUN 12 11 12 11 12   CREATININE 0.49 0.48 0.43* 0.34* 0.37*    CALCIUM 7.3* 7.8* 7.3* 7.5* 7.7*  MG  --  1.7  --   --   --   PHOS  --  3.6  --   --   --    GFR: Estimated Creatinine Clearance: 60.6 mL/min (A) (by C-G formula based on SCr of 0.37 mg/dL (L)). Liver Function Tests: Recent Labs  Lab 09/06/2018 1858 09/09/18 0525 09/10/18 0511  AST 23 13* 24  ALT 16 14 25   ALKPHOS 176* 183* 353*  BILITOT 1.8* 2.2* 1.8*  PROT 5.5* 5.3* 5.3*  ALBUMIN 1.7* 1.6* 1.4*   No results for input(s): LIPASE, AMYLASE in the last 168 hours. No results for input(s): AMMONIA in the last 168 hours. Coagulation Profile: Recent Labs  Lab 09/01/2018 1858  INR 1.5*   Cardiac Enzymes: No results for input(s): CKTOTAL, CKMB, CKMBINDEX, TROPONINI in the last 168 hours. BNP (last 3 results) No results for input(s): PROBNP in the last 8760 hours. HbA1C: No results for input(s): HGBA1C in the last 72 hours. CBG: Recent Labs  Lab 09/10/18 1149 09/10/18 1632 09/10/18 2121 09/11/18 0753 09/11/18 1149  GLUCAP 170* 163* 117* 102* 110*   Lipid Profile: No results for input(s): CHOL, HDL, LDLCALC, TRIG, CHOLHDL, LDLDIRECT in the last 72 hours. Thyroid Function Tests: No results for input(s): TSH, T4TOTAL, FREET4, T3FREE, THYROIDAB in the last 72 hours. Anemia Panel: No results for input(s): VITAMINB12, FOLATE, FERRITIN, TIBC, IRON, RETICCTPCT in the last 72 hours. Urine analysis:    Component Value Date/Time   COLORURINE AMBER (A) 09/08/2018 0255   APPEARANCEUR HAZY (A) 09/08/2018 0255   LABSPEC 1.016 09/08/2018 0255   PHURINE 6.0 09/08/2018 0255   GLUCOSEU NEGATIVE 09/08/2018 0255   HGBUR NEGATIVE 09/08/2018 0255   BILIRUBINUR NEGATIVE 09/08/2018 0255   KETONESUR NEGATIVE 09/08/2018 0255   PROTEINUR NEGATIVE 09/08/2018 0255   NITRITE POSITIVE (A) 09/08/2018 0255   LEUKOCYTESUR NEGATIVE 09/08/2018 0255     Cashus Halterman M.D. [facility] 09/11/2018, 1:37 PM  Pager: [phone] Between 7am to 7pm - call Pager - [phone]  After 7pm go to  [email or web address] - password [email or web address]  Call night coverage person covering after 7pm

## 2018-09-11 NOTE — Progress Notes (Signed)
OT Cancellation Note  Patient Details Name: Stacy Shelton MRN: [id number] DOB: 1960-10-23   Cancelled Treatment:    Reason Eval/Treat Not Completed: Medical issues which prohibited therapy(Pt with platelet count of 6. )  Malka So 09/11/2018, 7:57 AM  Nestor Lewandowsky, OTR/L Acute Rehabilitation Services Pager: [phone] Office: [phone]

## 2018-09-11 NOTE — Progress Notes (Signed)
CRITICAL VALUE ALERT  Critical Value:  Platelets 9  Date & Time Notied:  09/11/18 1820  Provider Notified: Dr. Tana Coast  Orders Received/Actions taken: MD notified, no new orders received at this time.

## 2018-09-11 NOTE — Progress Notes (Addendum)
Nutrition Follow-up  INTERVENTION:   -Continue Glucerna Shake po TID, each supplement provides 220 kcal and 10 grams of protein -Increase Prostat liquid protein PO 30 ml TID with meals, each supplement provides 100 kcal, 15 grams protein. -Continue Magic cup BID with meals, each supplement provides 290 kcal and 9 grams of protein -Continue Juven Fruit Punch BID, each serving provides 95kcal and 2.5g of protein (amino acids glutamine and arginine)   Addendum 1330: Spoke with pt's sister over the phone and explained current nutrition plan. Explained what each supplement was for and what they provide for the patient. Encouraged family to remind patient of the importance of nutrition for healing purposes and provided a feeding schedule.   NUTRITION DIAGNOSIS:   Increased nutrient needs related to cancer and cancer related treatments, wound healing as evidenced by estimated needs.  Ongoing.  GOAL:   Patient will meet greater than or equal to 90% of their needs  Not meeting.  MONITOR:   PO intake, Supplement acceptance, Labs, Weight trends, Skin, I & O's  REASON FOR ASSESSMENT:   Consult Assessment of nutrition requirement/status, Calorie Count  ASSESSMENT:   58 year old Caucasian female with past medical history of metastatic breast cancer, diabetes mellitus type 2, asthma was brought to hospital after altered mental status. Admitted for acute encephalopathy.  **[facility] working [employer] was ordered per Oncology. Per note on 8/11, pt showed motivation to improve PO intakes. MD did not recommend feeding tube placement.  Per nursing, pt refused breakfast this morning and is taking sips of supplements so far today.  Over the past 2 days, pt has been drinking her supplements but no meal intakes.  Patient currently alert/oriented x 2.  8/11: 1 Ensure, 2 Glucerna shakes 790 kcal (39% of estimated needs) 40g protein (44% of estimated needs)  8/12: 3 Glucerna shakes, 2  Prostat supplements 860 kcal (43% of estimated needs) 60g protein (66% of estimated needs  No new weights for this admission.  I/Os: +2.1L since admit Per nursing documentation, pt with +2 moderate edema in LEs and LUE. +1 mild edema in RUE.  Medications: Multivitamin with minerals daily Labs reviewed: CBGs: 102 Low Na, K  Diet Order:   Diet Order            Diet heart healthy/carb modified Room service appropriate? Yes; Fluid consistency: Thin  Diet effective now              EDUCATION NEEDS:   Not appropriate for education at this time  Skin:  Skin Assessment: Skin Integrity Issues: Skin Integrity Issues:: DTI, Stage I, Stage II, Unstageable DTI: left calf Stage I: left and right ear, left and right hip Stage II: back Unstageable: sacrum, left heel, right thigh  Last BM:  8/13  Height:   Ht Readings from Last 1 Encounters:  09/08/18 5\' 2"  (1.575 m)    Weight:   Wt Readings from Last 1 Encounters:  09/08/18 57.4 kg    Ideal Body Weight:  50 kg  BMI:  Body mass index is 23.15 kg/m.  Estimated Nutritional Needs:   Kcal:  2000-2200  Protein:  90-100g  Fluid:  2L/day  Clayton Bibles, MS, RD, LDN [place] [facility] Pager: [phone] After Hours Pager: [phone]

## 2018-09-11 NOTE — Progress Notes (Addendum)
Stacy Shelton   DOB:1960-07-06   JQ#:[id number]   The patient is seen and examined.  I agree with the documentation as follows ASSESSMENT & PLAN:  Metastatic breast cancer ([place]) She has significant complication from each cycle of treatment despite dose adjustment She is slowly recovering from side effects of therapy She is not ready to resume chemotherapy Continue aggressive supportive care now  Acquired pancytopenia Received 2 units of packed red blood cells on 09/08/2018 with improvement of hemoglobin, but now down to 7.8 today Transfuse 1 unit of packed red blood cells today Platelets down to 6,000 today; no bleeding reported Transfuse 1 unit of platelets today Continue to monitor blood counts carefully Her thrombocytopenia could be due to broad-spectrum IV antibiotics IV antibiotics changed to ampicillin on 09/10/2018  UTI, E. Coli On ampicillin IV Management per hospitalist  Insulin dependent diabetes Continue insulin sliding scale  Hypokalemia Replete per hospitalist  Decubitus ulcers She has significant pain secondary to multiple new decubitus ulcer Wound care is consulted  Malnutrition of moderate degree She has significant malnutrition We have extensive discussion about taking Remeron and nutritional supplement as tolerated I have requested nursing staff to offer her nutritional supplement every 2-3 hours while awake I also recommend calorie count; documentation of calorie intake was reviewed The patient appears to be motivated to increase oral intake as tolerated I did not identify any barriers for her from eating I do not recommend feeding tube placement The patient has no swallowing difficulties and denies nausea  Physical debility, critical myopathy She has not participated with physical therapy for over 3 weeks PT/OT has been consulted I reinforced the importance of getting physical therapy and Occupational Therapy in order to get better 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 I suggested Psychiatry consult. Patient declined  Code Status FULL  Goals of care I had numerous goals of care discussion with the patient and family  Discharge planning Consulted social worker for rehab and SNF  Possibility also include LTAC I will follow tomorrow  All questions were answered. The patient knows to call the clinic with any problems, questions or concerns. I have also reviewed the plan of care with the hospitalist and her sister today   Mikey Bussing, NP 09/11/2018 10:12 AM  [name], MD  Subjective:  Low-grade temp this morning of 99.6.  No chills. She has no complaints this morning. Denies pain. Denies bleeding. Taking in approximately 3 containers of Glucerna per day. Unfortunately, PT/OT visits were canceled yesterday secondary to thrombocytopenia.  Oncology History Overview Note  Biopsy is ER negative Her2/neu positive   Metastatic breast cancer ([place])  03/11/2018 Imaging   Ct scan of chest, abdomen and pelvis 1. 2.6 x 2.3 cm spiculated mass identified inferomedial quadrant of the left breast. Lesion appears to retract the overlying skin. Mammographic correlation recommended. 2. Small lymph nodes in the left axillar ill-defined and suspicious for metastatic disease. 3. Multiple bilateral pulmonary nodules measuring up to 2.7 cm diameter. These probably represent metastatic disease. Given the dominant size of the central right upper lobe lesion, synchronous lung primary can not be  completely excluded. 4. Multiple  ill-defined liver lesions compatible with metastatic disease. Dominant liver metastases measure up to almost 4 cm. 5.  Aortic Atherosclerois (ICD10-170.0)    03/12/2018 Pathology Results   Liver, needle/core biopsy, left lobe - METASTATIC CARCINOMA. - LYMPHOVASCULAR INVASION IS IDENTIFIED. - SEE COMMENT. Microscopic Comment The tumor cells are positive for cytokeratin 7. There is faint staining for GATA-3. There is non-specific staining for TTF-1. Cytokeratin 20, CDX-2, estrogen receptor, and GCDFP stains are negative. Given the clinical suspicion, the profile supports a primary breast carcinoma. Her2 will be performed and the results reported separately.  By immunohistochemistry, the tumor cells are POSITIVE for Her2 (3+).   03/12/2018 Imaging   Single 1.3 x 1.4 cm RIGHT occipital metastasis.  Borderline pachymeningeal enhancement of symmetric nature could be related to the superficial metastasis or osseous disease.   03/12/2018 Procedure   Ultrasound-guided core biopsy performed of a mass within the lateral segment of the left lobe of the liver.   03/16/2018 Imaging   Right occipital lobe 13 x 16 mm. Small satellite enhancing nodule deep to the larger lesion. The larger lesion shows central necrosis and probable mild hemorrhage or calcification.  Mild dural enhancement is less impressive and could be within normal limits.  Bone marrow in the clivus and cervical spine is diffusely low signal. No focal lesion. This may be related to the patient's anemia and abnormal blood count.   03/17/2018 Cancer Staging   Staging form: Breast, AJCC 8th Edition - Clinical: Stage IV (cT2, cN0, pM1, GX, ER-, PR: Not Assessed, HER2+) - Signed by [name], MD on 03/17/2018   03/19/2018 -  Chemotherapy   She received chemo with Taxotere, Herceptin and Perjeta   03/21/2018 Echocardiogram   IMPRESSIONS 1. The left ventricle has normal systolic function with an ejection fraction of 60-65%. The cavity  size was normal. Left ventricular diastolic Doppler parameters are consistent with impaired relaxation.  2. The right ventricle has normal systolic function. The cavity was normal. There is no increase in right ventricular wall thickness.  3. The mitral valve is normal in structure.  4. The tricuspid valve is normal in structure.  5. Strain imaging performed but not reported due to interpreter judgement, secondary to suboptimal image quality.   03/24/2018 Procedure   Successful placement of a right IJ approach Power Port with ultrasound and fluoroscopic guidance. The catheter is ready for use.   04/07/2018 - 04/16/2018 Hospital Admission   She was admitted for pneumonia   04/07/2018 - 04/16/2018 Hospital Admission   She was admitted to the hospital for pneumonia and respiratory failure   04/25/2018 Imaging   Retropharyngeal fluid collection compatible with effusion or possibly abscess. Soft tissue thickening extends into the right neck and surrounds the right carotid bifurcation and right internal carotid artery. There is also streaky density in the right lateral neck soft tissues with a focal 7.6 Mm lymph node in the right lateral neck. Stranding surrounds the right submandibular gland.   Findings are most compatible with infection. Favor pharyngitis with retropharyngeal effusion/abscess. Soft tissue thickening around the right carotid most compatible with infection rather than tumor. Right jugular vein is patent.  Multiple nodular densities in the right upper lobe may represent residual pneumonia or metastatic disease. Interval improvement in right upper lobe infiltrate compared with CT of 04/08/2018   04/25/2018 - 05/06/2018 Hospital Admission   She was admitted for retropharyngeal abscess   04/27/2018 Imaging   No evidence of large central pulmonary embolus is  noted in the main pulmonary artery or the main portions of the right and left pulmonary arteries. However, evaluation of the lower lobe  branches, particularly on the right, is limited due to respiratory motion artifact and other limiting issues. Pulmonary emboli and smaller peripheral branches can not be excluded on the basis of this exam.  Large left pleural effusion is noted with complete atelectasis of the left lower lobe. Increased left upper lobe opacity is noted posteriorly concerning for atelectasis or pneumonia.  Improved right upper lobe and lower lobe opacities are noted suggesting improving pneumonia or atelectasis.  15 mm right paratracheal lymph node is noted which is enlarged compared to prior exam; it is uncertain if this is metastatic or inflammatory in etiology.  Hepatic metastatic lesions are again noted.  Aortic Atherosclerosis (ICD10-I70.0).    04/27/2018 Procedure   Successful ultrasound guided left thoracentesis yielding 120 mL of blood-tinged of pleural fluid.   06/11/2018 Imaging   1. No evidence of pulmonary emboli. 2. Extensive bilateral lower lobe consolidation. 3. Small pleural effusions. 4. 2.5 cm right upper lobe nodule, enlarged from 04/27/2018 and which may reflect a metastasis or primary lung cancer. Stable to mild enlargement of subcentimeter nodules in both upper lobes consistent with known metastases. 5. Decreased size of liver metastases. 6. Unchanged mild mediastinal and right hilar lymphadenopathy. 7. Aortic Atherosclerosis (ICD10-I70.0).   06/11/2018 - 06/22/2018 Hospital Admission   She was hospitalized for infection and SVT   07/18/2018 Imaging   MRI brain 1. Regression of solitary right occipital lobe metastasis and edema. No new brain metastasis. 2. New ill-defined enhancement at the atlantooccipital membrane, presumably a metastatic focus. The adjacent bone marrow is abnormally low signal but not enhancing to the degree of the extraosseous lesion. Marrow signal is likely from anemia.     Metastasis to brain ([place])  03/17/2018 Initial Diagnosis   Metastasis to brain  [facility])   Metastasis to liver ([place])  03/17/2018 Initial Diagnosis   Metastasis to liver [facility])   Metastasis to bone ([place])  03/17/2018 Initial Diagnosis   Metastasis to bone ([facility])      Objective:  Vitals:   09/11/18 0800 09/11/18 0857  BP: 128/87   Pulse: (!) 122   Resp: (!) 27   Temp: 99.6 F (37.6 C)   SpO2: 98% 99%     Intake/Output Summary (Last 24 hours) at 09/11/2018 1012 Last data filed at 09/11/2018 0539 Gross per 24 hour  Intake 827.92 ml  Output 850 ml  Net -22.08 ml    GENERAL:alert, no distress and comfortable SKIN: Scattered ecchymotic areas to her arms and legs EYES: normal, Conjunctiva are pink and non-injected, sclera clear OROPHARYNX:no exudate, no erythema and lips, buccal mucosa, and tongue normal  NECK: supple, thyroid normal size, non-tender, without nodularity LYMPH:  no palpable lymphadenopathy in the cervical, axillary or inguinal LUNGS: clear to auscultation and percussion with normal breathing effort HEART: regular rate & rhythm and no murmurs and no lower extremity edema ABDOMEN:abdomen soft, non-tender and normal bowel sounds Musculoskeletal:no cyanosis of digits and no clubbing  NEURO: alert & oriented x 3 with fluent speech, she has severe critical myelopathy.  She is able to move her hand and wriggle her toes   Labs:  Recent Labs    05/06/18 0430  09/10/2018 1858  09/09/18 0525 09/10/18 0511 09/11/18 0537  NA 138   < > 128*   < > 130* 130* 132*  K 3.6   < > 3.5   < >  3.0* 3.5 3.1*  CL 99   < > 94*   < > 98 99 100  CO2 32   < > 24   < > 24 24 25   GLUCOSE 138*   < > 214*   < > 250* 211* 99  BUN 14   < > 12   < > 12 11 12   CREATININE 0.54   < > 0.49   < > 0.43* 0.34* 0.37*  CALCIUM 7.8*   < > 7.3*   < > 7.3* 7.5* 7.7*  GFRNONAA >60   < > >60   < > >60 >60 >60  GFRAA >60   < > >60   < > >60 >60 >60  PROT 5.4*   < > 5.5*  --  5.3* 5.3*  --   ALBUMIN 1.7*   < > 1.7*  --  1.6* 1.4*  --   AST 23   < > 23  --  13* 24  --   ALT 14   < > 16   --  14 25  --   ALKPHOS 372*   < > 176*  --  183* 353*  --   BILITOT 2.0*   < > 1.8*  --  2.2* 1.8*  --   BILIDIR 1.0*  --   --   --   --   --   --   IBILI 1.0*  --   --   --   --   --   --    < > = values in this interval not displayed.    Studies:  Dg Chest Portable 1 View  Result Date: 09/06/2018 CLINICAL DATA:  58 year old female with weakness. History of metastatic breast cancer. EXAM: PORTABLE CHEST 1 VIEW COMPARISON:  Chest radiograph dated 06/22/2018 and CT dated 06/11/2018 FINDINGS: There is shallow inspiration with bibasilar atelectasis. A 4.4 cm rounded opacity in the right upper lobe corresponds to density seen on the prior CT and may represent metastatic disease or primary lung cancer. Additional smaller 14 mm nodular density noted in the right upper lobe more laterally. There is probable small bilateral pleural effusions. No pneumothorax. There is mild cardiomegaly. Atherosclerotic calcification of aortic arch. Right sided Port-A-Cath with tip in the region of the cavoatrial junction. No acute osseous pathology. IMPRESSION: 1. Small bilateral pleural effusions and bibasilar atelectasis or infiltrate. 2. Right upper lobe rounded opacity corresponding to the density seen on the prior CT. An additional 14 mm right upper lobe pulmonary nodule noted. Electronically Signed   By: Anner Crete M.D.   On: 09/19/2018 19:07   Vas [place] Upper Extremity Venous Duplex  Result Date: 09/11/2018 UPPER VENOUS STUDY  Indications: Pain, Swelling, and Severe bruising Limitations: Unable to position the patient for optimal imaging. Comparison Study: No previous study available for comparison Performing Technologist: Toma Copier RVS  Examination Guidelines: A complete evaluation includes B-mode imaging, spectral Doppler, color Doppler, and power Doppler as needed of all accessible portions of each vessel. Bilateral testing is considered an integral part of a complete examination. Limited examinations  for reoccurring indications may be performed as noted.  Right Findings: +----------+------------+---------+-----------+----------+--------------+ RIGHT     CompressiblePhasicitySpontaneousProperties   Summary     +----------+------------+---------+-----------+----------+--------------+ IJV  Not visualized +----------+------------+---------+-----------+----------+--------------+ Subclavian                                          Not visualized +----------+------------+---------+-----------+----------+--------------+ Unable to Visulize the IJV or subclavian due to position of the patient and neck  Left Findings: +----------+------------+---------+-----------+----------+--------------------+ LEFT      CompressiblePhasicitySpontaneousProperties      Summary        +----------+------------+---------+-----------+----------+--------------------+ IJV           Full       Yes       Yes                  Difficult to                                                              visualize       +----------+------------+---------+-----------+----------+--------------------+ Subclavian               Yes       Yes                  Difficult to                                                              visualize       +----------+------------+---------+-----------+----------+--------------------+ Axillary      Full                                                       +----------+------------+---------+-----------+----------+--------------------+ Brachial      Full       Yes       Yes                                   +----------+------------+---------+-----------+----------+--------------------+ Radial        Full                                   Difficult to image  +----------+------------+---------+-----------+----------+--------------------+ Ulnar         Full                                    Difficult to image  +----------+------------+---------+-----------+----------+--------------------+ Cephalic      Full                                                       +----------+------------+---------+-----------+----------+--------------------+ Basilic       Full                                                       +----------+------------+---------+-----------+----------+--------------------+  Summary:  Right: Unable to visualize the IJV and subclavian due to position of the patient and neck.  Left: This was a limited study. There is no obvious evidence of DVT. However limited visualization due to sub optimal postioning of the arm due to pain.  *See table(s) above for measurements and observations.  Diagnosing physician: Curt Jews MD Electronically signed by Curt Jews MD on 09/11/2018 at 7:12:31 AM.    Final

## 2018-09-12 LAB — BASIC METABOLIC PANEL
Anion gap: 5 (ref 5–15)
BUN: 20 mg/dL (ref 6–20)
CO2: 26 mmol/L (ref 22–32)
Calcium: 8 mg/dL — ABNORMAL LOW (ref 8.9–10.3)
Chloride: 102 mmol/L (ref 98–111)
Creatinine, Ser: 0.37 mg/dL — ABNORMAL LOW (ref 0.44–1.00)
GFR calc Af Amer: >60 mL/min (ref >60)
GFR calc non Af Amer: >60 mL/min (ref >60)
Glucose, Bld: 222 mg/dL — ABNORMAL HIGH (ref 70–99)
Potassium: 3.8 mmol/L (ref 3.5–5.1)
Sodium: 133 mmol/L — ABNORMAL LOW (ref 135–145)

## 2018-09-12 LAB — CBC WITH DIFFERENTIAL/PLATELET
Abs Immature Granulocytes: 0.21 10*3/uL — ABNORMAL HIGH (ref 0.00–0.07)
Basophils Absolute: 0.1 10*3/uL (ref 0.0–0.1)
Basophils Relative: 4 %
Eosinophils Absolute: 0 10*3/uL (ref 0.0–0.5)
Eosinophils Relative: 0 %
HCT: 26.9 % — ABNORMAL LOW (ref 36.0–46.0)
Hemoglobin: 8.6 g/dL — ABNORMAL LOW (ref 12.0–15.0)
Immature Granulocytes: 9 %
Lymphocytes Relative: 34 %
Lymphs Abs: 0.8 10*3/uL (ref 0.7–4.0)
MCH: 29 pg (ref 26.0–34.0)
MCHC: 32 g/dL (ref 30.0–36.0)
MCV: 90.6 fL (ref 80.0–100.0)
Monocytes Absolute: 0.1 10*3/uL (ref 0.1–1.0)
Monocytes Relative: 4 %
Neutro Abs: 1.1 10*3/uL — ABNORMAL LOW (ref 1.7–7.7)
Neutrophils Relative %: 49 %
Platelets: 45 10*3/uL — ABNORMAL LOW (ref 150–400)
RBC: 2.97 MIL/uL — ABNORMAL LOW (ref 3.87–5.11)
RDW: 16.4 % — ABNORMAL HIGH (ref 11.5–15.5)
WBC: 2.3 10*3/uL — ABNORMAL LOW (ref 4.0–10.5)
nRBC: 0 % (ref 0.0–0.2)

## 2018-09-12 LAB — BPAM PLATELET PHERESIS
Blood Product Expiration Date: 202008142359
ISSUE DATE / TIME: 202008131406
Unit Type and Rh: 5100

## 2018-09-12 LAB — CULTURE, BLOOD (ROUTINE X 2)
Culture: NO GROWTH
Special Requests: ADEQUATE

## 2018-09-12 LAB — CBC
HCT: 28.4 % — ABNORMAL LOW (ref 36.0–46.0)
Hemoglobin: 9.2 g/dL — ABNORMAL LOW (ref 12.0–15.0)
MCH: 29.1 pg (ref 26.0–34.0)
MCHC: 32.4 g/dL (ref 30.0–36.0)
MCV: 89.9 fL (ref 80.0–100.0)
Platelets: 7 10*3/uL — CL (ref 150–400)
RBC: 3.16 MIL/uL — ABNORMAL LOW (ref 3.87–5.11)
RDW: 16.1 % — ABNORMAL HIGH (ref 11.5–15.5)
WBC: 2.6 10*3/uL — ABNORMAL LOW (ref 4.0–10.5)
nRBC: 0 % (ref 0.0–0.2)

## 2018-09-12 LAB — GLUCOSE, CAPILLARY
Glucose-Capillary: 199 mg/dL — ABNORMAL HIGH (ref 70–99)
Glucose-Capillary: 231 mg/dL — ABNORMAL HIGH (ref 70–99)
Glucose-Capillary: 175 mg/dL — ABNORMAL HIGH (ref 70–99)

## 2018-09-12 LAB — PREPARE PLATELET PHERESIS: Unit division: 0

## 2018-09-12 LAB — TYPE AND SCREEN
ABO/RH(D): A POS
Antibody Screen: NEGATIVE
Unit division: 0

## 2018-09-12 LAB — HEMOGLOBIN AND HEMATOCRIT, BLOOD
HCT: 27.3 % — ABNORMAL LOW (ref 36.0–46.0)
Hemoglobin: 8.9 g/dL — ABNORMAL LOW (ref 12.0–15.0)

## 2018-09-12 LAB — BPAM RBC
Blood Product Expiration Date: 202009072359
ISSUE DATE / TIME: 202008131058
Unit Type and Rh: 6200

## 2018-09-12 MED ORDER — INSULIN DETEMIR 100 UNIT/ML ~~LOC~~ SOLN
10.0000 [IU] | Freq: Every day | SUBCUTANEOUS | Status: DC
  Filled 2018-09-12 (×2): qty 0.1

## 2018-09-12 MED ORDER — OXYCODONE-ACETAMINOPHEN 5-325 MG PO TABS
1.0000 | ORAL_TABLET | ORAL | Status: DC | PRN
  Administered 2018-09-12 – 2018-09-15 (×5): 1 via ORAL
  Filled 2018-09-12 (×6): qty 1

## 2018-09-12 MED ORDER — SODIUM CHLORIDE 0.9% IV SOLUTION: Freq: Once | INTRAVENOUS | Status: DC

## 2018-09-12 NOTE — Progress Notes (Addendum)
Stacy Shelton   DOB:Nov 15, 1960   OY#:[id number]   The patient is seen and examined.  I agree with the documentation as follows ASSESSMENT & PLAN:  Metastatic breast cancer ([place]) She has significant complication from each cycle of treatment despite dose adjustment She is slowly recovering from side effects of therapy She is not ready to resume chemotherapy Continue aggressive supportive care now  Acquired pancytopenia Received 2 units of packed red blood cells on 09/08/2018 and 1 unit on 09/11/2018 with improvement of hemoglobin No transfusion indicated today Platelets are 7000 today; no bleeding reported Transfuse 1 unit of platelets today Continue to monitor blood counts carefully Her thrombocytopenia could be due to broad-spectrum IV antibiotics, progression of disease, recent bone marrow suppression from hospitalization, consumption from decubitus ulcers and malnutrition IV antibiotics changed to ampicillin on 09/10/2018 The patient is platelet refractory Because of this, she cannot be discharged from the hospital I am hopeful, once her antibiotics is discontinued, after waiting 3 to 5 days, we might see platelet count improvement For now, I do not recommend bone marrow biopsy as it would not change management  UTI, E. Coli On ampicillin IV Management per hospitalist Hopefully, her antibiotics can be discontinued soon  Insulin dependent diabetes Continue insulin sliding scale  Hypokalemia Potassium is normalized today Closely monitor  Decubitus ulcers She has significant pain secondary to multiple new decubitus ulcer Wound care is consulted  Malnutrition of moderate degree She has significant malnutrition We have extensive discussion about taking Remeron and nutritional supplement as tolerated I have requested nursing staff to offer her nutritional supplement every 2-3 hours while awake I also recommend calorie count; documentation of calorie intake was reviewed The patient  appears to be motivated to increase oral intake as tolerated I did not identify any barriers for her from eating I do not recommend feeding tube placement The patient has no swallowing difficulties and denies nausea We have extensive discussion about the importance of increase oral intake as tolerated  Physical debility, critical myopathy She has not participated with physical therapy for over 3 weeks PT/OT has been consulted I reinforced the importance of getting physical therapy and Occupational Therapy in order to get better 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 I suggested Psychiatry consult. Patient declined  Code Status FULL  Goals of care I had numerous goals of care discussion with the patient and family I had care conference with her sister, [name] and addressed all her questions  Discharge planning Consulted social worker for rehab and SNF  Possibility also include LTAC Due to platelet refractory, she is not a candidate to be discharged Plan of care is discussed with primary service I will return to see her next week.  Please call consult service over the weekend if questions arise  All questions were answered. The patient knows to call the clinic with any problems, questions or concerns.   Mikey Bussing, NP 09/12/2018 10:19 AM  [name], MD  Subjective:  Resting quietly this morning Has no complaints Denies bleeding PT/OT evaluations were canceled again on 8/13 secondary to thrombocytopenia  Oncology History Overview  Note  Biopsy is ER negative  Her2/neu positive   Metastatic breast cancer ([place])  03/11/2018 Imaging   Ct scan of chest, abdomen and pelvis 1. 2.6 x 2.3 cm spiculated mass identified inferomedial quadrant of the left breast. Lesion appears to retract the overlying skin. Mammographic correlation recommended. 2. Small lymph nodes in the left axillar ill-defined and suspicious for metastatic disease. 3. Multiple bilateral pulmonary nodules measuring up to 2.7 cm diameter. These probably represent metastatic disease. Given the dominant size of the central right upper lobe lesion, synchronous lung primary can not be completely excluded. 4. Multiple ill-defined liver lesions compatible with metastatic disease. Dominant liver metastases measure up to almost 4 cm. 5.  Aortic Atherosclerois (ICD10-170.0)    03/12/2018 Pathology Results   Liver, needle/core biopsy, left lobe - METASTATIC CARCINOMA. - LYMPHOVASCULAR INVASION IS IDENTIFIED. - SEE COMMENT. Microscopic Comment The tumor cells are positive for cytokeratin 7. There is faint staining for GATA-3. There is non-specific staining for TTF-1. Cytokeratin 20, CDX-2, estrogen receptor, and GCDFP stains are negative. Given the clinical suspicion, the profile supports a primary breast carcinoma. Her2 will be performed and the results reported separately.  By immunohistochemistry, the tumor cells are POSITIVE for Her2 (3+).   03/12/2018 Imaging   Single 1.3 x 1.4 cm RIGHT occipital metastasis.  Borderline pachymeningeal enhancement of symmetric nature could be related to the superficial metastasis or osseous disease.   03/12/2018 Procedure   Ultrasound-guided core biopsy performed of a mass within the lateral segment of the left lobe of the liver.   03/16/2018 Imaging   Right occipital lobe 13 x 16 mm. Small satellite enhancing nodule deep to the larger lesion. The larger lesion shows central necrosis and probable mild hemorrhage or calcification.  Mild dural enhancement is  less impressive and could be within normal limits.  Bone marrow in the clivus and cervical spine is diffusely low signal. No focal lesion. This may be related to the patient's anemia and abnormal blood count.   03/17/2018 Cancer Staging   Staging form: Breast, AJCC 8th Edition - Clinical: Stage IV (cT2, cN0, pM1, GX, ER-, PR: Not Assessed, HER2+) - Signed by [name], MD on 03/17/2018   03/19/2018 -  Chemotherapy   She received chemo with Taxotere, Herceptin and Perjeta   03/21/2018 Echocardiogram   IMPRESSIONS 1. The left ventricle has normal systolic function with an ejection fraction of 60-65%. The cavity size was normal. Left ventricular diastolic Doppler parameters are consistent with impaired relaxation.  2. The right ventricle has normal systolic function. The cavity was normal. There is no increase in right ventricular wall thickness.  3. The mitral valve is normal in structure.  4. The tricuspid valve is normal in structure.  5. Strain imaging performed but not reported due to interpreter judgement, secondary to suboptimal image quality.   03/24/2018 Procedure   Successful placement of a right IJ approach Power Port with ultrasound and fluoroscopic guidance. The catheter is ready for use.   04/07/2018 - 04/16/2018 Hospital Admission   She was admitted for pneumonia   04/07/2018 - 04/16/2018 Hospital Admission   She was admitted to the hospital for pneumonia and respiratory failure   04/25/2018 Imaging   Retropharyngeal fluid collection compatible with effusion or possibly abscess. Soft tissue thickening extends into the right neck and surrounds the right carotid bifurcation and right internal carotid artery. There is also streaky density in the right lateral neck soft tissues with a focal 7.6 Mm lymph node in the right  lateral neck. Stranding surrounds the right submandibular gland.   Findings are most compatible with infection. Favor pharyngitis with retropharyngeal  effusion/abscess. Soft tissue thickening around the right carotid most compatible with infection rather than tumor. Right jugular vein is patent.  Multiple nodular densities in the right upper lobe may represent residual pneumonia or metastatic disease. Interval improvement in right upper lobe infiltrate compared with CT of 04/08/2018   04/25/2018 - 05/06/2018 Hospital Admission   She was admitted for retropharyngeal abscess   04/27/2018 Imaging   No evidence of large central pulmonary embolus is noted in the main pulmonary artery or the main portions of the right and left pulmonary arteries. However, evaluation of the lower lobe branches, particularly on the right, is limited due to respiratory motion artifact and other limiting issues. Pulmonary emboli and smaller peripheral branches can not be excluded on the basis of this exam.  Large left pleural effusion is noted with complete atelectasis of the left lower lobe. Increased left upper lobe opacity is noted posteriorly concerning for atelectasis or pneumonia.  Improved right upper lobe and lower lobe opacities are noted suggesting improving pneumonia or atelectasis.  15 mm right paratracheal lymph node is noted which is enlarged compared to prior exam; it is uncertain if this is metastatic or inflammatory in etiology.  Hepatic metastatic lesions are again noted.  Aortic Atherosclerosis (ICD10-I70.0).    04/27/2018 Procedure   Successful ultrasound guided left thoracentesis yielding 120 mL of blood-tinged of pleural fluid.   06/11/2018 Imaging   1. No evidence of pulmonary emboli. 2. Extensive bilateral lower lobe consolidation. 3. Small pleural effusions. 4. 2.5 cm right upper lobe nodule, enlarged from 04/27/2018 and which may reflect a metastasis or primary lung cancer. Stable to mild enlargement of subcentimeter nodules in both upper lobes consistent with known metastases. 5. Decreased size of liver metastases. 6. Unchanged mild  mediastinal and right hilar lymphadenopathy. 7. Aortic Atherosclerosis (ICD10-I70.0).   06/11/2018 - 06/22/2018 Hospital Admission   She was hospitalized for infection and SVT   07/18/2018 Imaging   MRI brain 1. Regression of solitary right occipital lobe metastasis and edema. No new brain metastasis. 2. New ill-defined enhancement at the atlantooccipital membrane, presumably a metastatic focus. The adjacent bone marrow is abnormally low signal but not enhancing to the degree of the extraosseous lesion. Marrow signal is likely from anemia.     Metastasis to brain ([place])  03/17/2018 Initial Diagnosis   Metastasis to brain [facility])   Metastasis to liver ([place])  03/17/2018 Initial Diagnosis   Metastasis to liver [facility])   Metastasis to bone ([place])  03/17/2018 Initial Diagnosis   Metastasis to bone ([facility])      Objective:  Vitals:   09/12/18 0955 09/12/18 1010  BP: (!) 125/41 (!) 119/36  Pulse: (!) 109 (!) 112  Resp: (!) 29 (!) 34  Temp:  (!) 97.5 F (36.4 C)  SpO2: 100% 100%     Intake/Output Summary (Last 24 hours) at 09/12/2018 1019 Last data filed at 09/12/2018 0800 Gross per 24 hour  Intake 1032.37 ml  Output 850 ml  Net 182.37 ml    GENERAL:alert, no distress and comfortable SKIN: Scattered ecchymotic areas to her arms and legs EYES: normal, Conjunctiva are pink and non-injected, sclera clear OROPHARYNX:no exudate, no erythema and lips, buccal mucosa, and tongue normal  NECK: supple, thyroid normal size, non-tender, without nodularity LYMPH:  no palpable lymphadenopathy in the cervical, axillary or inguinal LUNGS: clear to auscultation and percussion with normal breathing effort  HEART: regular rate & rhythm and no murmurs and no lower extremity edema ABDOMEN:abdomen soft, non-tender and normal bowel sounds Musculoskeletal:no cyanosis of digits and no clubbing  NEURO: alert & oriented x 3 with fluent speech, she has severe critical myelopathy.  She is able to move her hand and  wriggle her toes   Labs:  Recent Labs    05/06/18 0430  09/08/2018 1858  09/09/18 0525 09/10/18 0511 09/11/18 0537 09/12/18 0300  NA 138   < > 128*   < > 130* 130* 132* 133*  K 3.6   < > 3.5   < > 3.0* 3.5 3.1* 3.8  CL 99   < > 94*   < > 98 99 100 102  CO2 32   < > 24   < > _0 GLUCOSE 138*   < > 214*   < > 250* 211* 99 222*  BUN 14   < > 12   < > _1 CREATININE 0.54   < > 0.49   < > 0.43* 0.34* 0.37* 0.37*  CALCIUM 7.8*   < > 7.3*   < > 7.3* 7.5* 7.7* 8.0*  GFRNONAA >60   < > >60   < > >60 >60 >60 >60  GFRAA >60   < > >60   < > >60 >60 >60 >60  PROT 5.4*   < > 5.5*  --  5.3* 5.3*  --   --   ALBUMIN 1.7*   < > 1.7*  --  1.6* 1.4*  --   --   AST 23   < > 23  --  13* 24  --   --   ALT 14   < > 16  --  14 25  --   --   ALKPHOS 372*   < > 176*  --  183* 353*  --   --   BILITOT 2.0*   < > 1.8*  --  2.2* 1.8*  --   --   BILIDIR 1.0*  --   --   --   --   --   --   --   IBILI 1.0*  --   --   --   --   --   --   --    < > = values in this interval not displayed.    Studies:  Ct Head Wo Contrast  Result Date: 09/11/2018 CLINICAL DATA:  Altered level of consciousness. Metastatic breast cancer. EXAM: CT HEAD WITHOUT CONTRAST TECHNIQUE: Contiguous axial images were obtained from the base of the skull through the vertex without intravenous contrast. COMPARISON:  CT head 04/08/2018, MRI head 07/17/2018 FINDINGS: Brain: Previously noted edema in the right occipital lobe has resolved. No new area of edema or hemorrhage. Mild atrophy appears to have progressed. Mild patchy white matter hypodensity also has progressed. Question brain radiation. Vascular: Negative for hyperdense vessel Skull: Negative Sinuses/Orbits: Prior sinus surgery. Mucoperiosteal thickening in the maxillary sinus bilaterally. Negative orbit. Other: None IMPRESSION: 1. Interval resolution of edema in the right occipital lobe related to metastatic disease on prior studies. Current study was done without intravenous  contrast and is not adequate to evaluate for residual or recurrent metastatic disease. 2. Progression of atrophy and white matter changes since the prior study. Question related to brain radiation. Electronically Signed   By: Franchot Gallo M.D.   On: 09/11/2018 12:04   Dg Chest Portable 1 View  Result Date: 09/26/2018 CLINICAL DATA:  58 year old female with weakness. History of metastatic breast cancer. EXAM: PORTABLE CHEST 1 VIEW COMPARISON:  Chest radiograph dated 06/22/2018 and CT dated 06/11/2018 FINDINGS: There is shallow inspiration with bibasilar atelectasis. A 4.4 cm rounded opacity in the right upper lobe corresponds to density seen on the prior CT and may represent metastatic disease or primary lung cancer. Additional smaller 14 mm nodular density noted in the right upper lobe more laterally. There is probable small bilateral pleural effusions. No pneumothorax. There is mild cardiomegaly. Atherosclerotic calcification of aortic arch. Right sided Port-A-Cath with tip in the region of the cavoatrial junction. No acute osseous pathology. IMPRESSION: 1. Small bilateral pleural effusions and bibasilar atelectasis or infiltrate. 2. Right upper lobe rounded opacity corresponding to the density seen on the prior CT. An additional 14 mm right upper lobe pulmonary nodule noted. Electronically Signed   By: Anner Crete M.D.   On: 09/26/2018 19:07   Vas [place] Upper Extremity Venous Duplex  Result Date: 09/11/2018 UPPER VENOUS STUDY  Indications: Pain, Swelling, and Severe bruising Limitations: Unable to position the patient for optimal imaging. Comparison Study: No previous study available for comparison Performing Technologist: Toma Copier RVS  Examination Guidelines: A complete evaluation includes B-mode imaging, spectral Doppler, color Doppler, and power Doppler as needed of all accessible portions of each vessel. Bilateral testing is considered an integral part of a complete examination. Limited  examinations for reoccurring indications may be performed as noted.  Right Findings: +----------+------------+---------+-----------+----------+--------------+ RIGHT     CompressiblePhasicitySpontaneousProperties   Summary     +----------+------------+---------+-----------+----------+--------------+ IJV                                                 Not visualized +----------+------------+---------+-----------+----------+--------------+ Subclavian                                          Not visualized +----------+------------+---------+-----------+----------+--------------+ Unable to Visulize the IJV or subclavian due to position of the patient and neck  Left Findings: +----------+------------+---------+-----------+----------+--------------------+ LEFT      CompressiblePhasicitySpontaneousProperties      Summary        +----------+------------+---------+-----------+----------+--------------------+ IJV           Full       Yes       Yes                  Difficult to                                                              visualize       +----------+------------+---------+-----------+----------+--------------------+ Subclavian               Yes       Yes                  Difficult to  visualize       +----------+------------+---------+-----------+----------+--------------------+ Axillary      Full                                                       +----------+------------+---------+-----------+----------+--------------------+ Brachial      Full       Yes       Yes                                   +----------+------------+---------+-----------+----------+--------------------+ Radial        Full                                   Difficult to image  +----------+------------+---------+-----------+----------+--------------------+ Ulnar         Full                                    Difficult to image  +----------+------------+---------+-----------+----------+--------------------+ Cephalic      Full                                                       +----------+------------+---------+-----------+----------+--------------------+ Basilic       Full                                                       +----------+------------+---------+-----------+----------+--------------------+  Summary:  Right: Unable to visualize the IJV and subclavian due to position of the patient and neck.  Left: This was a limited study. There is no obvious evidence of DVT. However limited visualization due to sub optimal postioning of the arm due to pain.  *See table(s) above for measurements and observations.  Diagnosing physician: Curt Jews MD Electronically signed by Curt Jews MD on 09/11/2018 at 7:12:31 AM.    Final

## 2018-09-12 NOTE — Progress Notes (Signed)
Pharmacy Antibiotic Note  Stacy Shelton is a 58 y.o. female admitted on 09/03/2018 with UTI.  Pharmacy has been consulted for ampicillin dosing.  8/10 UCx >=100,000 COLONIES/mL ENTEROCOCCUS FAECALIS- pan sensitive. WBC 3.5, SCr 0.34. AF.  D#5/7 of appropriate therapy for UTI  Plan: Ampicillin 1 gm IV q6h Stop date entered: 8/16 at [facility] to complete 7 day course (including vanc) Pharmacy to sign off  Height: 5\' 2"  (157.5 cm) Weight: 126 lb 8.7 oz (57.4 kg) IBW/kg (Calculated) : 50.1  Temp (24hrs), Avg:98.3 F (36.8 C), Min:97.5 F (36.4 C), Max:99.1 F (37.3 C)  Recent Labs  Lab 08/30/2018 1805  09/06/2018 2045 09/08/18 0031  09/09/18 0525 09/10/18 0511 09/10/18 1522 09/11/18 0306 09/11/18 0537 09/11/18 1735 09/12/18 0300  WBC 5.8  --   --  3.6*   < > 5.3 3.5* 3.1* 2.2*  --  2.9* 2.6*  CREATININE  --    < >  --  0.48  --  0.43* 0.34*  --   --  0.37*  --  0.37*  LATICACIDVEN 1.2  --  1.5  --   --   --   --   --   --   --   --   --    < > = values in this interval not displayed.    Estimated Creatinine Clearance: 60.6 mL/min (A) (by C-G formula based on SCr of 0.37 mg/dL (L)).    Allergies  Allergen Reactions  . Nsaids Anaphylaxis   Antimicrobials this admission:  8/9 vanc >> 8/12 8/9 cefepime >> 8/12 8/9 flagyl x 1 dose 8/12 ampicillin>> (8/16) Dose adjustments this admission:   Microbiology results:  8/9 MRSA PCR neg 8/10 Ucx: > 100 K enterococcus faecalis - pan sensitive 8/10 BCx2: ngtd 8/9 SARS neg 8/9 sputum: ordered, not yet sent  Thank you for allowing pharmacy to be a part of this patient's care.  Eudelia Bunch, Pharm.D [phone] 09/12/2018 12:24 PM

## 2018-09-12 NOTE — Evaluation (Signed)
Occupational Therapy Evaluation Patient Details Name: Stacy Shelton MRN: [id number] DOB: 11-Nov-1960 Today's Date: 09/12/2018    History of Present Illness Pt is a 58 year old woman undergoing chemotherapy for metastatic breast cancer who was admitted 09/22/2018 with AMS,sepsis due to UTI and septic shock, hypoglycemia, anemia, thrombocytopenia, and multiple decubitus ulcers. PMH: DM, asthma. Pt has had 5 admissions in 6 months.   Clinical Impression   Pt with inconsistency with report of PLOF leading up to this hospitalization. Presents with severe pain and poor tolerance of touch or positioning. She is dependent in all mobility and ADL. Will follow acutely on a trial basis. Recommending SNF unless family can provide adequate care at home.    Follow Up Recommendations  SNF;Supervision/Assistance - 24 hour(unless family can provide 24 hour total care)    Equipment Recommendations  None recommended by OT    Recommendations for Other Services       Precautions / Restrictions Precautions Precautions: Fall Restrictions Weight Bearing Restrictions: No      Mobility Bed Mobility               General bed mobility comments: pt declining any positioning or mobility at bed level  Transfers                 General transfer comment: deferred    Balance                                           ADL either performed or assessed with clinical judgement   ADL                                         General ADL Comments: Pt able to bring L hand to mouth. She requires total care.     Vision Baseline Vision/History: Wears glasses Wears Glasses: At all times       Perception     Praxis      Pertinent Vitals/Pain Pain Assessment: Faces Faces Pain Scale: Hurts worst Pain Location: generalized Pain Descriptors / Indicators: Grimacing;Guarding Pain Intervention(s): Limited activity within patient's tolerance     Hand Dominance  Right   Extremity/Trunk Assessment Upper Extremity Assessment Upper Extremity Assessment: Generalized weakness;RUE deficits/detail;LUE deficits/detail RUE Deficits / Details: moderate edema, RN made aware that arm band is tight RUE Coordination: decreased fine motor;decreased gross motor LUE Deficits / Details: mild edema, can bring hand to mouth LUE Coordination: decreased fine motor;decreased gross motor   Lower Extremity Assessment Lower Extremity Assessment: Generalized weakness       Communication Communication Communication: No difficulties   Cognition Arousal/Alertness: Awake/alert Behavior During Therapy: Flat affect Overall Cognitive Status: Impaired/Different from baseline                                 General Comments: pt with inconsistencies in PLOF, RN reports intermittent confusion, needs further assessment   General Comments       Exercises     Shoulder Instructions      Home Living Family/patient expects to be discharged to:: Private residence Living Arrangements: Parent(mother) Available Help at Discharge: Family;Available 24 hours/day(sister is in town from out of state) Type of Home: House Home Access: Stairs to enter Entrance  Stairs-Number of Steps: 2-3 Entrance Stairs-Rails: Right Home Layout: Two level;Able to live on main level with bedroom/bathroom     Bathroom Shower/Tub: Occupational psychologist: Standard     Home Equipment: Environmental consultant - 2 wheels;Bedside commode;Wheelchair - manual;Hospital bed          Prior Functioning/Environment Level of Independence: Needs assistance  Gait / Transfers Assistance Needed: per pt, she was propelling her w/c  ADL's / Homemaking Assistance Needed: pt reporting she needed a great deal of help for ADL, later stating she could roll her w/c into the shower and bathe herself   Comments: pt is a poor historian        OT Problem List: Decreased strength;Decreased range of  motion;Decreased activity tolerance;Impaired balance (sitting and/or standing);Decreased coordination;Decreased cognition;Decreased safety awareness;Decreased knowledge of use of DME or AE;Pain;Impaired UE functional use;Increased edema      OT Treatment/Interventions: Self-care/ADL training;Therapeutic exercise;Patient/family education;Balance training;DME and/or AE instruction;Cognitive remediation/compensation    OT Goals(Current goals can be found in the care plan section) Acute Rehab OT Goals Patient Stated Goal: did not state OT Goal Formulation: Patient unable to participate in goal setting Time For Goal Achievement: 09/26/18 Potential to Achieve Goals: Fair ADL Goals Pt Will Perform Eating: with mod assist;bed level Pt Will Perform Grooming: with mod assist;bed level Pt/caregiver will Perform Home Exercise Program: Increased ROM;Increased strength;Both right and left upper extremity;With minimal assist Additional ADL Goal #1: Pt/ caregiver will be knowlegeable in positioning to minimize risk of skin breakdown and decrease UE edema. Additional ADL Goal #2: Pt will perform rolling with moderate assistance for linen change, pressure relief and bed pan use. Additional ADL Goal #3: Pt will complete formal cognitive assessment.  OT Frequency: Min 1X/week   Barriers to D/C:            Co-evaluation PT/OT/SLP Co-Evaluation/Treatment: Yes Reason for Co-Treatment: Complexity of the patient's impairments (multi-system involvement);For patient/therapist safety   OT goals addressed during session: ADL's and self-care;Strengthening/ROM      AM-PAC OT "6 Clicks" Daily Activity     Outcome Measure Help from another person eating meals?: Total Help from another person taking care of personal grooming?: Total Help from another person toileting, which includes using toliet, bedpan, or urinal?: Total Help from another person bathing (including washing, rinsing, drying)?: Total Help from  another person to put on and taking off regular upper body clothing?: Total Help from another person to put on and taking off regular lower body clothing?: Total 6 Click Score: 6   End of Session Nurse Communication: Other (comment)(minimal ability to participate in therapy)  Activity Tolerance: Patient limited by pain Patient left: in bed;with call bell/phone within reach  OT Visit Diagnosis: Pain;Muscle weakness (generalized) (M62.81)                Time: 0258-5277 OT Time Calculation (min): 12 min Charges:  OT General Charges $OT Visit: 1 Visit OT Evaluation $OT Eval Moderate Complexity: 1 Mod  Nestor Lewandowsky, OTR/L Acute Rehabilitation Services Pager: [phone] Office: [phone] Malka So 09/12/2018, 3:15 PM

## 2018-09-12 NOTE — Progress Notes (Signed)
Calorie Count Note  72 hour calorie count ordered. Day 3 results below.  Per nursing this morning, pt has not eaten any solid food. Sips of cola and Glucerna shake so far today. Has also had 1 Prostat supplement as well.   Diet: Heart Healthy/ CHO modified Supplements: Glucerna shakes TID, Prostat TID, Magic Cups BID, Juven BID  8/13 Breakfast: 30 kcal -nursing documentation Lunch: 380 kcal -nursing documentation Dinner: not documented Supplements: 1152 kcal, 75g protein  Estimated total intake: 1562 kcal (78% of minimum estimated needs)  75g protein (83% of minimum estimated needs)  Nutrition Dx: Increased nutrient needs related to cancer and cancer related treatments, wound healing as evidenced by estimated needs.  Goal: Pt to meet >/= 90% of their estimated nutrition needs   Intervention:  If PO continues to be poor: -May need to consider temporary nutrition support to improve nutrition status for cancer treatments.  --Continue Glucerna Shake po TID, each supplement provides 220 kcal and 10 grams of protein -Increase Prostat liquid protein PO 30 ml TID with meals, each supplement provides 100 kcal, 15 grams protein. -Continue Magic cup BID with meals, each supplement provides 290 kcal and 9 grams of protein -Continue Juven Fruit Punch BID, each serving provides 95kcal and 2.5g of protein (amino acids glutamine and arginine)   Clayton Bibles, MS, RD, LDN [facility] Dietitian Pager: [phone] After Hours Pager: [phone]

## 2018-09-12 NOTE — Progress Notes (Signed)
[facility]                                                                              Patient Demographics  Stacy Shelton, is a 58 y.o. female, DOB - 04-30-1960, SNK:[id number]  Admit date - 09/15/2018   Admitting Physician Bonnell Public, MD  Outpatient Primary MD for the patient is Robyne Peers, MD  Outpatient specialists:   LOS - 5  days   Medical records reviewed and are as summarized below:    No chief complaint on file.      Brief summary   Patient is a 58 year old Caucasian female with past medical history of metastatic breast cancer, diabetes mellitus type 2, asthma was brought to hospital after altered mental status.  Found to be hypoglycemic with blood glucose 19.  She was started on broad-spectrum antibiotic for infectious etiology.  Also patient found to be severely anemic and thrombocytopenic.  Transfused  PRBC and platelets. Patient was hypotensive and required brief Levophed infusion on 8/10, off on 8/11  Assessment & Plan    Principal Problem:   Sepsis (Stacy Shelton) secondary to UTI with septic shock -Patient met sepsis criteria at the time of admission with low-grade fever, hypotension, tachycardia, tachypnea, hypoxia, source likely due to UTI. -Briefly required vasopressors, Levophed was weaned off on 8/11 -Urine culture showed enterococcus, continue IV ampicillin total 7 days, pharmacy following  Active Problems: Acute enterococcus UTI -Urine culture showed more than 100,000 colonies of Enterococcus faecalis, initially started on broad-spectrum antibiotics on IV vancomycin and cefepime -Continue IV ampicillin, total 7 days including IV vancomycin received   Pancytopenia with thrombocytopenia (HCC) -Appears to be refractory to transfusions, likely due to bone marrow involvement, chemo.  Initially thought to have worsened due to antibiotics but vancomycin and cefepime were discontinued with no significant improvement.  - patient had  presented with hemoglobin of 6.6, had received 3 units packed RBCs, also had received 1 unit of platelets, received 1 unit Plt on 8/12, 8/13.  Received 1 packed RBC transfusion on 8/13 -Platelets 7K, again today, will transfuse 1 unit platelets.  Discussed with Dr. Alvy Bimler, feels patient is terminal and refractory to transfusions, needs GOC, will address with patient's sister today.      Uncontrolled diabetes mellitus with hyperglycemia (HCC) -Somewhat uncontrolled, increase Levemir to 10 units nightly, continue sliding scale insulin  -Diabetic coordinator consulted    Metastatic breast cancer ([place]) -Dr. Alvy Bimler following, continue supportive care for now, not ready to resume chemotherapy  Hypoalbuminemia, moderate degree malnutrition, severe deconditioning -Albumin 1.4, added pro-stat, oral intake remains poor    Acute encephalopathy -Intermittent waxing and waning mental status, possibly due to acute UTI, critical illness -CT head 8/13 negative for any bleed or stroke  History of mild diastolic dysfunction -2D echo 5/20 had shown EF of 60 to 65%, left ventricular diastolic function could not be ascertained - continue to monitor I's and O's  Multiple decub ulcers -Per nursing documentation Bilateral Ear pressure injuries, Stage 1 (POA); Upper back pressure injury Stage 2, (POA); Sacral pressure injury, unstageable (POA); Left Heel pressure injury, unstageable (POA); Right thigh pressure injury, unstagable (POA) -  Agree, continue wound care, continue PT    Code Status: Full CODE STATUS DVT Prophylaxis:  SCD's Family Communication: Discussed in detail with the patient, all imaging results, lab results explained to patient's sister Stacy Shelton on the phone    Disposition Plan: High risk of deterioration with multiple medical problems, sepsis, UTI, profound thrombocytopenia, remains in stepdown today  Time Spent in minutes 35 minutes  Procedures:  None  Consultants:   Oncology, Dr.  Alvy Bimler  Antimicrobials:   Anti-infectives (From admission, onward)   Start     Dose/Rate Route Frequency Ordered Stop   09/10/18 1200  ampicillin (OMNIPEN) 1 g in sodium chloride 0.9 % 100 mL IVPB     1 g 300 mL/hr over 20 Minutes Intravenous Every 6 hours 09/10/18 0822 09/14/18 2359   09/09/18 0300  vancomycin (VANCOCIN) IVPB 1000 mg/200 mL premix  Status:  Discontinued     1,000 mg 200 mL/hr over 60 Minutes Intravenous Daily 09/08/18 0323 09/10/18 0752   09/08/18 0600  ceFEPIme (MAXIPIME) 2 g in sodium chloride 0.9 % 100 mL IVPB  Status:  Discontinued     2 g 200 mL/hr over 30 Minutes Intravenous Every 8 hours 09/08/18 0323 09/10/18 0752   09/06/2018 1900  ceFEPIme (MAXIPIME) 2 g in sodium chloride 0.9 % 100 mL IVPB     2 g 200 mL/hr over 30 Minutes Intravenous  Once 09/27/2018 1858 09/19/2018 2120   09/04/2018 1900  metroNIDAZOLE (FLAGYL) IVPB 500 mg     500 mg 100 mL/hr over 60 Minutes Intravenous  Once 09/18/2018 1858 09/23/2018 2228   09/23/2018 1900  vancomycin (VANCOCIN) IVPB 1000 mg/200 mL premix     1,000 mg 200 mL/hr over 60 Minutes Intravenous  Once 09/10/2018 1858 09/08/18 0221         Medications  Scheduled Meds:  sodium chloride   Intravenous Once   amiodarone  200 mg Oral Daily   budesonide (PULMICORT) nebulizer solution  0.25 mg Nebulization BID   Chlorhexidine Gluconate Cloth  6 each Topical Daily   feeding supplement (GLUCERNA SHAKE)  237 mL Oral Q4H   feeding supplement (PRO-STAT SUGAR FREE 64)  30 mL Oral TID   insulin aspart  0-5 Units Subcutaneous QHS   insulin aspart  0-9 Units Subcutaneous TID WC   insulin detemir  5 Units Subcutaneous QHS   mouth rinse  15 mL Mouth Rinse BID   montelukast  10 mg Oral QHS   multivitamin with minerals  1 tablet Oral Daily   nutrition supplement (JUVEN)  1 packet Oral BID BM   pantoprazole  40 mg Oral Daily   sodium chloride flush  10-40 mL Intracatheter Q12H   Continuous Infusions:  ampicillin (OMNIPEN) IV 1  g (09/12/18 1324)   PRN Meds:.acetaminophen, albuterol, lidocaine-prilocaine, morphine, sodium chloride flush      Subjective:   [name] was seen and examined today.  Patient denies any acute complaints, still appears weak and deconditioned.  Platelets again down.  No acute events overnight.   Patient denies dizziness, chest pain, shortness of breath, abdominal pain, N/V/D/C.  Objective:   Vitals:   09/12/18 1010 09/12/18 1200 09/12/18 1240 09/12/18 1323  BP: (!) 119/36 (!) 123/39  (!) 123/51  Pulse: (!) 112 (!) 106  (!) 102  Resp: (!) 34 (!) 32  18  Temp: (!) 97.5 F (36.4 C)  98.7 F (37.1 C) 98.1 F (36.7 C)  TempSrc: Oral  Oral Oral  SpO2: 100% 100%  100%  Weight:  Height:        Intake/Output Summary (Last 24 hours) at 09/12/2018 1349 Last data filed at 09/12/2018 1323 Gross per 24 hour  Intake 1499.87 ml  Output 650 ml  Net 849.87 ml     Wt Readings from Last 3 Encounters:  09/08/18 57.4 kg  08/06/18 55.8 kg  07/28/18 55.8 kg   Physical Exam  General: Alert and oriented x 3, NAD  Eyes:   HEENT:    Cardiovascular: S1 S2 clear, no murmurs, RRR.1+ pedal edema b/l  Respiratory: CTAB, no wheezing, rales or rhonchi  Gastrointestinal: Soft, nontender, nondistended, NBS  Ext: 1+ pedal edema bilaterally  Neuro: no new deficits  Musculoskeletal: No cyanosis, clubbing  Skin: No rashes  Psych: Normal affect and demeanor, alert and oriented x3    Data Reviewed:  I have personally reviewed following labs and imaging studies  Micro Results Recent Results (from the past 240 hour(s))  Culture, blood (routine x 2)     Status: None   Collection Time: 09/20/2018  6:06 PM   Specimen: BLOOD  Result Value Ref Range Status   Specimen Description   Final    BLOOD PICC LINE Performed at [place] [address] [place], [place] [address]    Special Requests   Final    BOTTLES DRAWN AEROBIC AND ANAEROBIC Blood Culture adequate  volume Performed at [place] [address] [place], [place] [address]    Culture   Final    NO GROWTH 5 DAYS Performed at [facility], [place] [address] [place], [place] [address]    Report Status 09/12/2018 FINAL  Final  SARS Coronavirus 2 [facility] order, Performed in [facility] hospital lab) Nasopharyngeal Nasopharyngeal Swab     Status: None   Collection Time: 08/31/2018  6:11 PM   Specimen: Nasopharyngeal Swab  Result Value Ref Range Status   SARS Coronavirus 2 NEGATIVE NEGATIVE Final    Comment: (NOTE) If result is NEGATIVE SARS-CoV-2 target nucleic acids are NOT DETECTED. The SARS-CoV-2 RNA is generally detectable in upper and lower  respiratory specimens during the acute phase of infection. The lowest  concentration of SARS-CoV-2 viral copies this assay can detect is 250  copies / mL. A negative result does not preclude SARS-CoV-2 infection  and should not be used as the sole basis for treatment or other  patient management decisions.  A negative result may occur with  improper specimen collection / handling, submission of specimen other  than nasopharyngeal swab, presence of viral mutation(s) within the  areas targeted by this assay, and inadequate number of viral copies  (<250 copies / mL). A negative result must be combined with clinical  observations, patient history, and epidemiological information. If result is POSITIVE SARS-CoV-2 target nucleic acids are DETECTED. The SARS-CoV-2 RNA is generally detectable in upper and lower  respiratory specimens dur ing the acute phase of infection.  Positive  results are indicative of active infection with SARS-CoV-2.  Clinical  correlation with patient history and other diagnostic information is  necessary to determine patient infection status.  Positive results do  not rule out bacterial infection or co-infection with other viruses. If result is PRESUMPTIVE POSTIVE SARS-CoV-2 nucleic acids MAY BE  PRESENT.   A presumptive positive result was obtained on the submitted specimen  and confirmed on repeat testing.  While 2019 novel coronavirus  (SARS-CoV-2) nucleic acids may be present in the submitted sample  additional confirmatory testing may be necessary for  epidemiological  and / or clinical management purposes  to differentiate between  SARS-CoV-2 and other Sarbecovirus currently known to infect humans.  If clinically indicated additional testing with an alternate test  methodology [phone]) is advised. The SARS-CoV-2 RNA is generally  detectable in upper and lower respiratory sp ecimens during the acute  phase of infection. The expected result is Negative. Fact Sheet for Patients:  StrictlyIdeas.no Fact Sheet for Healthcare Providers: BankingDealers.co.za This test is not yet approved or cleared by the [place] FDA and has been authorized for detection and/or diagnosis of SARS-CoV-2 by FDA under an Emergency Use Authorization (EUA).  This EUA will remain in effect (meaning this test can be used) for the duration of the COVID-19 declaration under Section 564(b)(1) of the Act, 21 U.S.C. section 360bbb-3(b)(1), unless the authorization is terminated or revoked sooner. Performed at [facility], [place] [address], [place], [place] [address]   MRSA PCR Screening     Status: None   Collection Time: 09/11/2018 11:25 PM   Specimen: Nasal Mucosa; Nasopharyngeal  Result Value Ref Range Status   MRSA by PCR NEGATIVE NEGATIVE Final    Comment:        The GeneXpert MRSA Assay (FDA approved for NASAL specimens only), is one component of a comprehensive MRSA colonization surveillance program. It is not intended to diagnose MRSA infection nor to guide or monitor treatment for MRSA infections. Performed at [facility], [place] [address], [place], [place] [address]   Culture, blood (routine x 2)      Status: None (Preliminary result)   Collection Time: 09/08/18 12:31 AM   Specimen: BLOOD LEFT HAND  Result Value Ref Range Status   Specimen Description   Final    BLOOD LEFT HAND Performed at [facility], [place] [address], [place], [place] [address]    Special Requests   Final    BOTTLES DRAWN AEROBIC ONLY Blood Culture results may not be optimal due to an inadequate volume of blood received in culture bottles Performed at [place] [address], [place], [place] [address]    Culture   Final    NO GROWTH 4 DAYS Performed at [facility], [place] [address] [place], [place] [address]    Report Status PENDING  Incomplete  Urine culture     Status: Abnormal   Collection Time: 09/08/18  2:55 AM   Specimen: Urine, Random  Result Value Ref Range Status   Specimen Description   Final    URINE, RANDOM Performed at [place] [address], [place], [place] [address]    Special Requests   Final    NONE Performed at [facility], [place] [address] [place],  [address]    Culture >=100,000 COLONIES/mL ENTEROCOCCUS FAECALIS (A)  Final   Report Status 09/10/2018 FINAL  Final   Organism ID, Bacteria ENTEROCOCCUS FAECALIS (A)  Final      Susceptibility   Enterococcus faecalis - MIC*    AMPICILLIN <=2 SENSITIVE Sensitive     LEVOFLOXACIN 2 SENSITIVE Sensitive     NITROFURANTOIN <=16 SENSITIVE Sensitive     VANCOMYCIN 1 SENSITIVE Sensitive     * >=100,000 COLONIES/mL ENTEROCOCCUS FAECALIS    Radiology Reports Ct Head Wo Contrast  Result Date: 09/11/2018 CLINICAL DATA:  Altered level of consciousness. Metastatic breast cancer. EXAM: CT HEAD WITHOUT CONTRAST TECHNIQUE: Contiguous axial images were obtained from the base of the skull through the vertex  without intravenous contrast. COMPARISON:  CT head 04/08/2018, MRI head 07/17/2018 FINDINGS: Brain: Previously noted edema in the right occipital lobe has resolved.  No new area of edema or hemorrhage. Mild atrophy appears to have progressed. Mild patchy white matter hypodensity also has progressed. Question brain radiation. Vascular: Negative for hyperdense vessel Skull: Negative Sinuses/Orbits: Prior sinus surgery. Mucoperiosteal thickening in the maxillary sinus bilaterally. Negative orbit. Other: None IMPRESSION: 1. Interval resolution of edema in the right occipital lobe related to metastatic disease on prior studies. Current study was done without intravenous contrast and is not adequate to evaluate for residual or recurrent metastatic disease. 2. Progression of atrophy and white matter changes since the prior study. Question related to brain radiation. Electronically Signed   By: Franchot Gallo M.D.   On: 09/11/2018 12:04   Dg Chest Portable 1 View  Result Date: 09/09/2018 CLINICAL DATA:  58 year old female with weakness. History of metastatic breast cancer. EXAM: PORTABLE CHEST 1 VIEW COMPARISON:  Chest radiograph dated 06/22/2018 and CT dated 06/11/2018 FINDINGS: There is shallow inspiration with bibasilar atelectasis. A 4.4 cm rounded opacity in the right upper lobe corresponds to density seen on the prior CT and may represent metastatic disease or primary lung cancer. Additional smaller 14 mm nodular density noted in the right upper lobe more laterally. There is probable small bilateral pleural effusions. No pneumothorax. There is mild cardiomegaly. Atherosclerotic calcification of aortic arch. Right sided Port-A-Cath with tip in the region of the cavoatrial junction. No acute osseous pathology. IMPRESSION: 1. Small bilateral pleural effusions and bibasilar atelectasis or infiltrate. 2. Right upper lobe rounded opacity corresponding to the density seen on the prior CT. An additional 14 mm right upper lobe pulmonary nodule noted. Electronically Signed   By: Anner Crete M.D.   On: 09/02/2018 19:07   Vas [place] Upper Extremity Venous Duplex  Result Date:  09/11/2018 UPPER VENOUS STUDY  Indications: Pain, Swelling, and Severe bruising Limitations: Unable to position the patient for optimal imaging. Comparison Study: No previous study available for comparison Performing Technologist: Toma Copier RVS  Examination Guidelines: A complete evaluation includes B-mode imaging, spectral Doppler, color Doppler, and power Doppler as needed of all accessible portions of each vessel. Bilateral testing is considered an integral part of a complete examination. Limited examinations for reoccurring indications may be performed as noted.  Right Findings: +----------+------------+---------+-----------+----------+--------------+  RIGHT      Compressible Phasicity Spontaneous Properties    Summary      +----------+------------+---------+-----------+----------+--------------+  IJV                                                      Not visualized  +----------+------------+---------+-----------+----------+--------------+  Subclavian                                               Not visualized  +----------+------------+---------+-----------+----------+--------------+ Unable to Visulize the IJV or subclavian due to position of the patient and neck  Left Findings: +----------+------------+---------+-----------+----------+--------------------+  LEFT       Compressible Phasicity Spontaneous Properties       Summary         +----------+------------+---------+-----------+----------+--------------------+  IJV            Full  Yes        Yes                    Difficult to                                                                     visualize        +----------+------------+---------+-----------+----------+--------------------+  Subclavian                 Yes        Yes                    Difficult to                                                                     visualize        +----------+------------+---------+-----------+----------+--------------------+  Axillary        Full                                                            +----------+------------+---------+-----------+----------+--------------------+  Brachial       Full        Yes        Yes                                      +----------+------------+---------+-----------+----------+--------------------+  Radial         Full                                       Difficult to image   +----------+------------+---------+-----------+----------+--------------------+  Ulnar          Full                                       Difficult to image   +----------+------------+---------+-----------+----------+--------------------+  Cephalic       Full                                                            +----------+------------+---------+-----------+----------+--------------------+  Basilic        Full                                                            +----------+------------+---------+-----------+----------+--------------------+  Summary:  Right: Unable to visualize the IJV and subclavian due to position of the patient and neck.  Left: This was a limited study. There is no obvious evidence of DVT. However limited visualization due to sub optimal postioning of the arm due to pain.  *See table(s) above for measurements and observations.  Diagnosing physician: Curt Jews MD Electronically signed by Curt Jews MD on 09/11/2018 at 7:12:31 AM.    Final     Lab Data:  CBC: Recent Labs  Lab 09/25/2018 1805  09/10/18 0511 09/10/18 1522 09/11/18 0306 09/11/18 1735 09/12/18 0300  WBC 5.8   < > 3.5* 3.1* 2.2* 2.9* 2.6*  NEUTROABS 3.6  --   --  1.6*  --  1.5*  --   HGB 6.6*   < > 9.5* 9.0* 7.8* 9.5* 9.2*  HCT 21.0*   < > 28.6* 27.0* 24.1* 29.0* 28.4*  MCV 90.5   < > 86.1 86.8 87.6 89.2 89.9  PLT 5*   < > 7* 10* 6* 9* 7*   < > = values in this interval not displayed.   Basic Metabolic Panel: Recent Labs  Lab 09/08/18 0031 09/09/18 0525 09/10/18 0511 09/11/18 0537 09/12/18 0300  NA 133* 130* 130* 132*  133*  K 4.2 3.0* 3.5 3.1* 3.8  CL 98 98 99 100 102  CO2 24 24 24 25 26   GLUCOSE 118* 250* 211* 99 222*  BUN 11 12 11 12 20   CREATININE 0.48 0.43* 0.34* 0.37* 0.37*  CALCIUM 7.8* 7.3* 7.5* 7.7* 8.0*  MG 1.7  --   --   --   --   PHOS 3.6  --   --   --   --    GFR: Estimated Creatinine Clearance: 60.6 mL/min (A) (by C-G formula based on SCr of 0.37 mg/dL (L)). Liver Function Tests: Recent Labs  Lab 09/23/2018 1858 09/09/18 0525 09/10/18 0511  AST 23 13* 24  ALT 16 14 25   ALKPHOS 176* 183* 353*  BILITOT 1.8* 2.2* 1.8*  PROT 5.5* 5.3* 5.3*  ALBUMIN 1.7* 1.6* 1.4*   No results for input(s): LIPASE, AMYLASE in the last 168 hours. No results for input(s): AMMONIA in the last 168 hours. Coagulation Profile: Recent Labs  Lab 09/02/2018 1858  INR 1.5*   Cardiac Enzymes: No results for input(s): CKTOTAL, CKMB, CKMBINDEX, TROPONINI in the last 168 hours. BNP (last 3 results) No results for input(s): PROBNP in the last 8760 hours. HbA1C: No results for input(s): HGBA1C in the last 72 hours. CBG: Recent Labs  Lab 09/11/18 1149 09/11/18 1625 09/11/18 2120 09/12/18 0748 09/12/18 1136  GLUCAP 110* 168* 209* 199* 231*   Lipid Profile: No results for input(s): CHOL, HDL, LDLCALC, TRIG, CHOLHDL, LDLDIRECT in the last 72 hours. Thyroid Function Tests: No results for input(s): TSH, T4TOTAL, FREET4, T3FREE, THYROIDAB in the last 72 hours. Anemia Panel: No results for input(s): VITAMINB12, FOLATE, FERRITIN, TIBC, IRON, RETICCTPCT in the last 72 hours. Urine analysis:    Component Value Date/Time   COLORURINE AMBER (A) 09/08/2018 0255   APPEARANCEUR HAZY (A) 09/08/2018 0255   LABSPEC 1.016 09/08/2018 0255   PHURINE 6.0 09/08/2018 0255   GLUCOSEU NEGATIVE 09/08/2018 0255   HGBUR NEGATIVE 09/08/2018 0255   BILIRUBINUR NEGATIVE 09/08/2018 0255   KETONESUR NEGATIVE 09/08/2018 0255   PROTEINUR NEGATIVE 09/08/2018 0255   NITRITE POSITIVE (A) 09/08/2018 0255   LEUKOCYTESUR NEGATIVE  09/08/2018 0255     Nalany Steedley M.D. [facility] 09/12/2018, 1:49 PM  Pager: [phone]  Between 7am to 7pm - call Pager - [phone]  After 7pm go to [email or web address] - password [email or web address]  Call night coverage person covering after 7pm

## 2018-09-12 NOTE — Evaluation (Signed)
Physical Therapy Evaluation Patient Details Name: Stacy Shelton MRN: [id number] DOB: March 13, 1960 Today's Date: 09/12/2018   History of Present Illness  Pt is a 58 year old woman undergoing chemotherapy for metastatic breast cancer who was admitted 09/08/2018 with AMS,sepsis due to UTI and septic shock, hypoglycemia, anemia, thrombocytopenia, and multiple decubitus ulcers. PMH: DM, asthma. Pt has had 5 admissions in 6 months.  Clinical Impression  Pt admitted as above and presenting with functional mobility limitations 2* significant generalized deconditioning and severe pain with touch or movement. Pt with inconsistency with report of PLOF leading up to this hospitalization. She is dependent in all mobility and ADL. Will follow acutely on a trial basis. Recommending SNF unless family can provide level of assist required for safe return home.    Follow Up Recommendations Other (comment)(TBD - if pt can benefit at point of dc)    Equipment Recommendations  None recommended by PT    Recommendations for Other Services       Precautions / Restrictions Precautions Precautions: Fall Restrictions Weight Bearing Restrictions: No      Mobility  Bed Mobility               General bed mobility comments: pt declining any positioning or mobility at bed level 2* to pain  Transfers                 General transfer comment: deferred at pt request 2* pain  Ambulation/Gait             General Gait Details: deferred - pt reports non-ambulatory  Stairs            Wheelchair Mobility    Modified Rankin (Stroke Patients Only)       Balance                                             Pertinent Vitals/Pain Pain Assessment: Faces Faces Pain Scale: Hurts worst Pain Location: generalized Pain Descriptors / Indicators: Grimacing;Guarding Pain Intervention(s): Limited activity within patient's tolerance    Home Living Family/patient expects to be  discharged to:: Private residence Living Arrangements: Parent(mother) Available Help at Discharge: Family;Available 24 hours/day(sister in town from out of state temporarily) Type of Home: House Home Access: Stairs to enter Entrance Stairs-Rails: Right Entrance Stairs-Number of Steps: 2-3 Home Layout: Two level;Able to live on main level with bedroom/bathroom Home Equipment: Gilford Rile - 2 wheels;Bedside commode;Wheelchair - manual;Hospital bed      Prior Function Level of Independence: Needs assistance   Gait / Transfers Assistance Needed: per pt, largely utilizing wc at home   ADL's / Homemaking Assistance Needed: pt reporting she needed a great deal of help for ADL, later stating she could roll her w/c into the shower and bathe herself  Comments: pt is a poor historian     Hand Dominance   Dominant Hand: Right    Extremity/Trunk Assessment   Upper Extremity Assessment Upper Extremity Assessment: Generalized weakness;Defer to OT evaluation RUE Deficits / Details: moderate edema, RN made aware that arm band is tight RUE Coordination: decreased fine motor;decreased gross motor LUE Deficits / Details: mild edema, can bring hand to mouth LUE Coordination: decreased fine motor;decreased gross motor    Lower Extremity Assessment Lower Extremity Assessment: Generalized weakness;RLE deficits/detail;LLE deficits/detail RLE Deficits / Details: pt moving toes slightly but states that is all she can do  RLE: Unable to fully assess due to pain LLE Deficits / Details: pt moving toes slightly but states that is all she can do LLE: Unable to fully assess due to pain       Communication   Communication: No difficulties  Cognition Arousal/Alertness: Awake/alert Behavior During Therapy: Flat affect Overall Cognitive Status: Impaired/Different from baseline                                 General Comments: pt with inconsistencies in PLOF, RN reports intermittent confusion,  needs further assessment      General Comments      Exercises     Assessment/Plan    PT Assessment Patient needs continued PT services(trial basis)  PT Problem List Decreased strength;Decreased range of motion;Decreased activity tolerance;Decreased balance;Decreased mobility;Decreased coordination;Decreased knowledge of use of DME;Pain;Decreased skin integrity       PT Treatment Interventions Functional mobility training;Therapeutic activities;Balance training;Patient/family education;Neuromuscular re-education;Therapeutic exercise;DME instruction    PT Goals (Current goals can be found in the Care Plan section)  Acute Rehab PT Goals Patient Stated Goal: did not state PT Goal Formulation: Patient unable to participate in goal setting Time For Goal Achievement: 09/26/18 Potential to Achieve Goals: Poor    Frequency Min 2X/week(trial to see if pt can benefit)   Barriers to discharge Decreased caregiver support Sister is from out of state and "will stay as long as she can"    Co-evaluation PT/OT/SLP Co-Evaluation/Treatment: Yes Reason for Co-Treatment: Complexity of the patient's impairments (multi-system involvement) PT goals addressed during session: Mobility/safety with mobility OT goals addressed during session: ADL's and self-care       AM-PAC PT "6 Clicks" Mobility  Outcome Measure Help needed turning from your back to your side while in a flat bed without using bedrails?: Total Help needed moving from lying on your back to sitting on the side of a flat bed without using bedrails?: Total Help needed moving to and from a bed to a chair (including a wheelchair)?: Total Help needed standing up from a chair using your arms (e.g., wheelchair or bedside chair)?: Total Help needed to walk in hospital room?: Total Help needed climbing 3-5 steps with a railing? : Total 6 Click Score: 6    End of Session   Activity Tolerance: Patient limited by pain Patient left: in  bed;with call bell/phone within reach Nurse Communication: Other (comment);Need for lift equipment PT Visit Diagnosis: Muscle weakness (generalized) (M62.81)    Time: 7209-4709 PT Time Calculation (min) (ACUTE ONLY): 15 min   Charges:   PT Evaluation $PT Eval Low Complexity: 1 Low          Debe Coder PT Acute Rehabilitation Services Pager [phone] Office [phone]   Emylee Decelle 09/12/2018, 5:11 PM

## 2018-09-13 LAB — PREPARE PLATELET PHERESIS: Unit division: 0

## 2018-09-13 LAB — CULTURE, BLOOD (ROUTINE X 2): Culture: NO GROWTH

## 2018-09-13 LAB — CBC
HCT: 25.7 % — ABNORMAL LOW (ref 36.0–46.0)
Hemoglobin: 8.2 g/dL — ABNORMAL LOW (ref 12.0–15.0)
MCH: 29.2 pg (ref 26.0–34.0)
MCHC: 31.9 g/dL (ref 30.0–36.0)
MCV: 91.5 fL (ref 80.0–100.0)
Platelets: 30 10*3/uL — ABNORMAL LOW (ref 150–400)
RBC: 2.81 MIL/uL — ABNORMAL LOW (ref 3.87–5.11)
RDW: 16.2 % — ABNORMAL HIGH (ref 11.5–15.5)
WBC: 2.2 10*3/uL — ABNORMAL LOW (ref 4.0–10.5)
nRBC: 0 % (ref 0.0–0.2)

## 2018-09-13 LAB — BASIC METABOLIC PANEL
Anion gap: 7 (ref 5–15)
BUN: 16 mg/dL (ref 6–20)
CO2: 28 mmol/L (ref 22–32)
Calcium: 8.2 mg/dL — ABNORMAL LOW (ref 8.9–10.3)
Chloride: 101 mmol/L (ref 98–111)
Creatinine, Ser: 0.32 mg/dL — ABNORMAL LOW (ref 0.44–1.00)
GFR calc Af Amer: >60 mL/min (ref >60)
GFR calc non Af Amer: >60 mL/min (ref >60)
Glucose, Bld: 178 mg/dL — ABNORMAL HIGH (ref 70–99)
Potassium: 3.1 mmol/L — ABNORMAL LOW (ref 3.5–5.1)
Sodium: 136 mmol/L (ref 135–145)

## 2018-09-13 LAB — GLUCOSE, CAPILLARY
Glucose-Capillary: 137 mg/dL — ABNORMAL HIGH (ref 70–99)
Glucose-Capillary: 186 mg/dL — ABNORMAL HIGH (ref 70–99)
Glucose-Capillary: 218 mg/dL — ABNORMAL HIGH (ref 70–99)
Glucose-Capillary: 181 mg/dL — ABNORMAL HIGH (ref 70–99)

## 2018-09-13 LAB — BPAM PLATELET PHERESIS
Blood Product Expiration Date: 202008150810
ISSUE DATE / TIME: 202008140935
Unit Type and Rh: 6200

## 2018-09-13 MED ORDER — POTASSIUM CHLORIDE 20 MEQ PO PACK
40.0000 meq | PACK | Freq: Once | ORAL | Status: AC
  Administered 2018-09-13: 40 meq via ORAL
  Filled 2018-09-13 (×2): qty 2

## 2018-09-13 MED ORDER — INSULIN DETEMIR 100 UNIT/ML ~~LOC~~ SOLN
13.0000 [IU] | Freq: Every day | SUBCUTANEOUS | Status: DC
  Administered 2018-09-13 – 2018-09-14 (×2): 13 [IU] via SUBCUTANEOUS
  Filled 2018-09-13 (×3): qty 0.13

## 2018-09-13 NOTE — Progress Notes (Signed)
[facility]                                                                              Patient Demographics  Stacy Shelton, is a 58 y.o. female, DOB - [birth year], ZOX:[id number]  Admit date - 09/11/2018   Admitting Physician Bonnell Public, MD  Outpatient Primary MD for the patient is Robyne Peers, MD  Outpatient specialists:   LOS - 6  days   Medical records reviewed and are as summarized below:    No chief complaint on file.      Brief summary   Patient is a 58 year old Caucasian female with past medical history of metastatic breast cancer, diabetes mellitus type 2, asthma was brought to hospital after altered mental status.  Found to be hypoglycemic with blood glucose 19.  She was started on broad-spectrum antibiotic for infectious etiology.  Also patient found to be severely anemic and thrombocytopenic.  Transfused  PRBC and platelets. Patient was hypotensive and required brief Levophed infusion on 8/10, off on 8/11  Assessment & Plan    Principal Problem:   Sepsis ([place]) secondary to UTI with septic shock -Patient met sepsis criteria at the time of admission with low-grade fever, hypotension, tachycardia, tachypnea, hypoxia, source likely due to UTI. -Briefly required vasopressors, Levophed was weaned off on 8/11 -Urine culture showed enterococcus, continue IV ampicillin, total 7 days, pharmacy following, patient will finish antibiotics on 8/16 at midnight.  Active Problems: Acute enterococcus UTI -Urine culture showed more than 100,000 colonies of Enterococcus faecalis, initially started on broad-spectrum antibiotics on IV vancomycin and cefepime -Continue IV ampicillin, total 7 days including IV vancomycin received, to complete on 8/16 midnight  Pancytopenia with thrombocytopenia (HCC) -Appears to be refractory to transfusions, likely due to bone marrow involvement, chemo.  Initially thought to have worsened due to antibiotics but vancomycin and  cefepime were discontinued with no significant improvement.  - patient had presented with hemoglobin of 6.6, had received 3 units packed RBCs, also had received 1 unit of platelets, received 1 unit platelets pheresis on 8/12, 8/13, 8/14.  Received 1 packed RBC transfusion on 8/13 -Platelets 30k today    Uncontrolled diabetes mellitus with hyperglycemia (HCC) -CBGs uncontrolled, increase Levemir to 13 units, continue SSI -Diabetic coordinator consulted    Metastatic breast cancer ([employer]) -Dr. Alvy Bimler following, continue supportive care for now, not ready to resume chemotherapy  Hypoalbuminemia, moderate degree malnutrition, severe deconditioning -Albumin 1.4, added pro-stat, oral intake remains poor    Acute encephalopathy -Intermittent waxing and waning mental status, possibly due to acute UTI, critical illness -CT head 8/13 negative for any bleed or stroke -Alert and oriented x3, fairly close to her baseline  History of mild diastolic dysfunction -2D echo 5/20 had shown EF of 60 to 65%, left ventricular diastolic function could not be ascertained - continue to monitor I's and O's  Multiple decub ulcers -Per nursing documentation Bilateral Ear pressure injuries, Stage 1 (POA); Upper back pressure injury Stage 2, (POA); Sacral pressure injury, unstageable (POA); Left Heel pressure injury, unstageable (POA); Right thigh pressure injury, unstagable (POA) -Agree, continue wound care, continue PT  Deconditioning, debility -Worked  with PT yesterday, not very motivated to continue PT.  Code Status: Full CODE STATUS DVT Prophylaxis:  SCD's Family Communication: Discussed in detail with the patient, all imaging results, lab results explained to patient and her Sister [name] on the phone   Disposition Plan: High risk of deterioration with multiple medical problems, sepsis, UTI, profound thrombocytopenia, remains in stepdown today  Time Spent in minutes 35 minutes  Procedures:   None  Consultants:   Oncology, Dr. Alvy Bimler  Antimicrobials:   Anti-infectives (From admission, onward)   Start     Dose/Rate Route Frequency Ordered Stop   09/10/18 1200  ampicillin (OMNIPEN) 1 g in sodium chloride 0.9 % 100 mL IVPB     1 g 300 mL/hr over 20 Minutes Intravenous Every 6 hours 09/10/18 0822 09/14/18 2359   09/09/18 0300  vancomycin (VANCOCIN) IVPB 1000 mg/200 mL premix  Status:  Discontinued     1,000 mg 200 mL/hr over 60 Minutes Intravenous Daily 09/08/18 0323 09/10/18 0752   09/08/18 0600  ceFEPIme (MAXIPIME) 2 g in sodium chloride 0.9 % 100 mL IVPB  Status:  Discontinued     2 g 200 mL/hr over 30 Minutes Intravenous Every 8 hours 09/08/18 0323 09/10/18 0752   09/20/2018 1900  ceFEPIme (MAXIPIME) 2 g in sodium chloride 0.9 % 100 mL IVPB     2 g 200 mL/hr over 30 Minutes Intravenous  Once 09/29/2018 1858 09/02/2018 2120   09/20/2018 1900  metroNIDAZOLE (FLAGYL) IVPB 500 mg     500 mg 100 mL/hr over 60 Minutes Intravenous  Once 09/14/2018 1858 09/02/2018 2228   09/23/2018 1900  vancomycin (VANCOCIN) IVPB 1000 mg/200 mL premix     1,000 mg 200 mL/hr over 60 Minutes Intravenous  Once 09/11/2018 1858 09/08/18 0221         Medications  Scheduled Meds:  sodium chloride   Intravenous Once   amiodarone  200 mg Oral Daily   budesonide (PULMICORT) nebulizer solution  0.25 mg Nebulization BID   Chlorhexidine Gluconate Cloth  6 each Topical Daily   feeding supplement (GLUCERNA SHAKE)  237 mL Oral Q4H   feeding supplement (PRO-STAT SUGAR FREE 64)  30 mL Oral TID   insulin aspart  0-5 Units Subcutaneous QHS   insulin aspart  0-9 Units Subcutaneous TID WC   insulin detemir  10 Units Subcutaneous QHS   mouth rinse  15 mL Mouth Rinse BID   montelukast  10 mg Oral QHS   multivitamin with minerals  1 tablet Oral Daily   nutrition supplement (JUVEN)  1 packet Oral BID BM   pantoprazole  40 mg Oral Daily   sodium chloride flush  10-40 mL Intracatheter Q12H   Continuous  Infusions:  ampicillin (OMNIPEN) IV Stopped (09/13/18 1259)   PRN Meds:.acetaminophen, albuterol, lidocaine-prilocaine, morphine, oxyCODONE-acetaminophen, sodium chloride flush      Subjective:   Archana Eckman was seen and examined today.  No acute issues overnight.  No nausea vomiting, fevers or chills.  States he will eat better however not interested in doing PT today.  Patient denies dizziness, chest pain, shortness of breath, abdominal pain.  Feels pain in her legs.  Objective:   Vitals:   09/13/18 1200 09/13/18 1222 09/13/18 1300 09/13/18 1400  BP: (!) 116/37   (!) 121/42  Pulse: 100  99 (!) 103  Resp: (!) 27  (!) 24 (!) 29  Temp:  98 F (36.7 C)    TempSrc:  Axillary    SpO2: 100%  99% 99%  Weight:      Height:        Intake/Output Summary (Last 24 hours) at 09/13/2018 1411 Last data filed at 09/13/2018 1400 Gross per 24 hour  Intake 412.67 ml  Output 800 ml  Net -387.33 ml     Wt Readings from Last 3 Encounters:  09/08/18 57.4 kg  08/06/18 55.8 kg  07/28/18 55.8 kg   Physical Exam  General: Alert and oriented x 3, NAD  Eyes:   HEENT:  Atraumatic, normocephalic  Cardiovascular: S1 S2 clear, no murmurs, RRR.  1+ pedal edema b/l  Respiratory: CTAB, no wheezing, rales or rhonchi  Gastrointestinal: Soft, nontender, nondistended, NBS  Ext: 1+ pedal edema bilaterally  Neuro: no new deficits  Musculoskeletal: No cyanosis, clubbing  Skin: No rashes  Psych: Normal affect and demeanor, alert and oriented x3    Data Reviewed:  I have personally reviewed following labs and imaging studies  Micro Results Recent Results (from the past 240 hour(s))  Culture, blood (routine x 2)     Status: None   Collection Time: 09/06/2018  6:06 PM   Specimen: BLOOD  Result Value Ref Range Status   Specimen Description   Final    BLOOD PICC LINE Performed at [facility] [address], [place], [place] [address]    Special Requests   Final     BOTTLES DRAWN AEROBIC AND ANAEROBIC Blood Culture adequate volume Performed at [facility] [address], [place], [place] [address]    Culture   Final    NO GROWTH 5 DAYS Performed at [facility], [place] [address], [place], [place] [address]    Report Status 09/12/2018 FINAL  Final  SARS Coronavirus 2 [facility] order, Performed in [facility] hospital lab) Nasopharyngeal Nasopharyngeal Swab     Status: None   Collection Time: 09/06/2018  6:11 PM   Specimen: Nasopharyngeal Swab  Result Value Ref Range Status   SARS Coronavirus 2 NEGATIVE NEGATIVE Final    Comment: (NOTE) If result is NEGATIVE SARS-CoV-2 target nucleic acids are NOT DETECTED. The SARS-CoV-2 RNA is generally detectable in upper and lower  respiratory specimens during the acute phase of infection. The lowest  concentration of SARS-CoV-2 viral copies this assay can detect is 250  copies / mL. A negative result does not preclude SARS-CoV-2 infection  and should not be used as the sole basis for treatment or other  patient management decisions.  A negative result may occur with  improper specimen collection / handling, submission of specimen other  than nasopharyngeal swab, presence of viral mutation(s) within the  areas targeted by this assay, and inadequate number of viral copies  (<250 copies / mL). A negative result must be combined with clinical  observations, patient history, and epidemiological information. If result is POSITIVE SARS-CoV-2 target nucleic acids are DETECTED. The SARS-CoV-2 RNA is generally detectable in upper and lower  respiratory specimens dur ing the acute phase of infection.  Positive  results are indicative of active infection with SARS-CoV-2.  Clinical  correlation with patient history and other diagnostic information is  necessary to determine patient infection status.  Positive results do  not rule out bacterial infection or co-infection with other viruses. If  result is PRESUMPTIVE POSTIVE SARS-CoV-2 nucleic acids MAY BE PRESENT.   A presumptive positive result was obtained on the submitted specimen  and confirmed on repeat testing.  While 2019 novel coronavirus  (SARS-CoV-2) nucleic acids may be present in the submitted  sample  additional confirmatory testing may be necessary for epidemiological  and / or clinical management purposes  to differentiate between  SARS-CoV-2 and other Sarbecovirus currently known to infect humans.  If clinically indicated additional testing with an alternate test  methodology [id number]) is advised. The SARS-CoV-2 RNA is generally  detectable in upper and lower respiratory sp ecimens during the acute  phase of infection. The expected result is Negative. Fact Sheet for Patients:  StrictlyIdeas.no Fact Sheet for Healthcare Providers: BankingDealers.co.za This test is not yet approved or cleared by the [place] FDA and has been authorized for detection and/or diagnosis of SARS-CoV-2 by FDA under an Emergency Use Authorization (EUA).  This EUA will remain in effect (meaning this test can be used) for the duration of the COVID-19 declaration under Section 564(b)(1) of the Act, 21 U.S.C. section 360bbb-3(b)(1), unless the authorization is terminated or revoked sooner. Performed at [facility], [place] [address], [place], [place] [address]   MRSA PCR Screening     Status: None   Collection Time: 09/13/2018 11:25 PM   Specimen: Nasal Mucosa; Nasopharyngeal  Result Value Ref Range Status   MRSA by PCR NEGATIVE NEGATIVE Final    Comment:        The GeneXpert MRSA Assay (FDA approved for NASAL specimens only), is one component of a comprehensive MRSA colonization surveillance program. It is not intended to diagnose MRSA infection nor to guide or monitor treatment for MRSA infections. Performed at [facility], [place] [address], [place], [place] [address]   Culture, blood (routine x 2)     Status: None   Collection Time: 09/08/18 12:31 AM   Specimen: BLOOD LEFT HAND  Result Value Ref Range Status   Specimen Description   Final    BLOOD LEFT HAND Performed at [facility], [place] [address], [place], [place] [address]    Special Requests   Final    BOTTLES DRAWN AEROBIC ONLY Blood Culture results may not be optimal due to an inadequate volume of blood received in culture bottles Performed at [facility] [address], [place], [place] [address]    Culture   Final    NO GROWTH 5 DAYS Performed at [facility], [place] [address] [place], [place] [address]    Report Status 09/13/2018 FINAL  Final  Urine culture     Status: Abnormal   Collection Time: 09/08/18  2:55 AM   Specimen: Urine, Random  Result Value Ref Range Status   Specimen Description   Final    URINE, RANDOM Performed at [facility] [address], [place], [place] [address]    Special Requests   Final    NONE Performed at [facility], [place] [address], [place], [place] [address]    Culture >=100,000 COLONIES/mL ENTEROCOCCUS FAECALIS (A)  Final   Report Status 09/10/2018 FINAL  Final   Organism ID, Bacteria ENTEROCOCCUS FAECALIS (A)  Final      Susceptibility   Enterococcus faecalis - MIC*    AMPICILLIN <=2 SENSITIVE Sensitive     LEVOFLOXACIN 2 SENSITIVE Sensitive     NITROFURANTOIN <=16 SENSITIVE Sensitive     VANCOMYCIN 1 SENSITIVE Sensitive     * >=100,000 COLONIES/mL ENTEROCOCCUS FAECALIS    Radiology Reports Ct Head Wo Contrast  Result Date: 09/11/2018 CLINICAL DATA:  Altered level of consciousness. Metastatic breast cancer. EXAM: CT HEAD WITHOUT CONTRAST TECHNIQUE: Contiguous axial images were obtained from  the base of the skull through the vertex without intravenous contrast. COMPARISON:  CT head 04/08/2018, MRI head 07/17/2018 FINDINGS: Brain: Previously noted  edema in the right occipital lobe has resolved. No new area of edema or hemorrhage. Mild atrophy appears to have progressed. Mild patchy white matter hypodensity also has progressed. Question brain radiation. Vascular: Negative for hyperdense vessel Skull: Negative Sinuses/Orbits: Prior sinus surgery. Mucoperiosteal thickening in the maxillary sinus bilaterally. Negative orbit. Other: None IMPRESSION: 1. Interval resolution of edema in the right occipital lobe related to metastatic disease on prior studies. Current study was done without intravenous contrast and is not adequate to evaluate for residual or recurrent metastatic disease. 2. Progression of atrophy and white matter changes since the prior study. Question related to brain radiation. Electronically Signed   By: Franchot Gallo M.D.   On: 09/11/2018 12:04   Dg Chest Portable 1 View  Result Date: 09/09/2018 CLINICAL DATA:  58 year old female with weakness. History of metastatic breast cancer. EXAM: PORTABLE CHEST 1 VIEW COMPARISON:  Chest radiograph dated 06/22/2018 and CT dated 06/11/2018 FINDINGS: There is shallow inspiration with bibasilar atelectasis. A 4.4 cm rounded opacity in the right upper lobe corresponds to density seen on the prior CT and may represent metastatic disease or primary lung cancer. Additional smaller 14 mm nodular density noted in the right upper lobe more laterally. There is probable small bilateral pleural effusions. No pneumothorax. There is mild cardiomegaly. Atherosclerotic calcification of aortic arch. Right sided Port-A-Cath with tip in the region of the cavoatrial junction. No acute osseous pathology. IMPRESSION: 1. Small bilateral pleural effusions and bibasilar atelectasis or infiltrate. 2. Right upper lobe rounded opacity corresponding to the density seen on the prior CT. An additional 14 mm right upper lobe pulmonary nodule noted. Electronically Signed   By: Anner Crete M.D.   On: 09/09/2018 19:07   Vas [place] Upper  Extremity Venous Duplex  Result Date: 09/11/2018 UPPER VENOUS STUDY  Indications: Pain, Swelling, and Severe bruising Limitations: Unable to position the patient for optimal imaging. Comparison Study: No previous study available for comparison Performing Technologist: Toma Copier RVS  Examination Guidelines: A complete evaluation includes B-mode imaging, spectral Doppler, color Doppler, and power Doppler as needed of all accessible portions of each vessel. Bilateral testing is considered an integral part of a complete examination. Limited examinations for reoccurring indications may be performed as noted.  Right Findings: +----------+------------+---------+-----------+----------+--------------+  RIGHT      Compressible Phasicity Spontaneous Properties    Summary      +----------+------------+---------+-----------+----------+--------------+  IJV                                                      Not visualized  +----------+------------+---------+-----------+----------+--------------+  Subclavian                                               Not visualized  +----------+------------+---------+-----------+----------+--------------+ Unable to Visulize the IJV or subclavian due to position of the patient and neck  Left Findings: +----------+------------+---------+-----------+----------+--------------------+  LEFT       Compressible Phasicity Spontaneous Properties       Summary         +----------+------------+---------+-----------+----------+--------------------+  IJV  Full        Yes        Yes                    Difficult to                                                                     visualize        +----------+------------+---------+-----------+----------+--------------------+  Subclavian                 Yes        Yes                    Difficult to                                                                     visualize         +----------+------------+---------+-----------+----------+--------------------+  Axillary       Full                                                            +----------+------------+---------+-----------+----------+--------------------+  Brachial       Full        Yes        Yes                                      +----------+------------+---------+-----------+----------+--------------------+  Radial         Full                                       Difficult to image   +----------+------------+---------+-----------+----------+--------------------+  Ulnar          Full                                       Difficult to image   +----------+------------+---------+-----------+----------+--------------------+  Cephalic       Full                                                            +----------+------------+---------+-----------+----------+--------------------+  Basilic        Full                                                            +----------+------------+---------+-----------+----------+--------------------+  Summary:  Right: Unable to visualize the IJV and subclavian due to position of the patient and neck.  Left: This was a limited study. There is no obvious evidence of DVT. However limited visualization due to sub optimal postioning of the arm due to pain.  *See table(s) above for measurements and observations.  Diagnosing physician: Curt Jews MD Electronically signed by Curt Jews MD on 09/11/2018 at 7:12:31 AM.    Final     Lab Data:  CBC: Recent Labs  Lab 09/23/2018 1805  09/10/18 1522 09/11/18 0306 09/11/18 1735 09/12/18 0300 09/12/18 1606 09/12/18 1910 09/13/18 0500  WBC 5.8   < > 3.1* 2.2* 2.9* 2.6*  --  2.3* 2.2*  NEUTROABS 3.6  --  1.6*  --  1.5*  --   --  1.1*  --   HGB 6.6*   < > 9.0* 7.8* 9.5* 9.2* 8.9* 8.6* 8.2*  HCT 21.0*   < > 27.0* 24.1* 29.0* 28.4* 27.3* 26.9* 25.7*  MCV 90.5   < > 86.8 87.6 89.2 89.9  --  90.6 91.5  PLT 5*   < > 10* 6* 9* 7*  --  45* 30*   < >  = values in this interval not displayed.   Basic Metabolic Panel: Recent Labs  Lab 09/08/18 0031 09/09/18 0525 09/10/18 0511 09/11/18 0537 09/12/18 0300 09/13/18 0500  NA 133* 130* 130* 132* 133* 136  K 4.2 3.0* 3.5 3.1* 3.8 3.1*  CL 98 98 99 100 102 101  CO2 24 24 24 25 26 28   GLUCOSE 118* 250* 211* 99 222* 178*  BUN 11 12 11 12 20 16   CREATININE 0.48 0.43* 0.34* 0.37* 0.37* 0.32*  CALCIUM 7.8* 7.3* 7.5* 7.7* 8.0* 8.2*  MG 1.7  --   --   --   --   --   PHOS 3.6  --   --   --   --   --    GFR: Estimated Creatinine Clearance: 60.6 mL/min (A) (by C-G formula based on SCr of 0.32 mg/dL (L)). Liver Function Tests: Recent Labs  Lab 09/27/2018 1858 09/09/18 0525 09/10/18 0511  AST 23 13* 24  ALT 16 14 25   ALKPHOS 176* 183* 353*  BILITOT 1.8* 2.2* 1.8*  PROT 5.5* 5.3* 5.3*  ALBUMIN 1.7* 1.6* 1.4*   No results for input(s): LIPASE, AMYLASE in the last 168 hours. No results for input(s): AMMONIA in the last 168 hours. Coagulation Profile: Recent Labs  Lab 09/19/2018 1858  INR 1.5*   Cardiac Enzymes: No results for input(s): CKTOTAL, CKMB, CKMBINDEX, TROPONINI in the last 168 hours. BNP (last 3 results) No results for input(s): PROBNP in the last 8760 hours. HbA1C: No results for input(s): HGBA1C in the last 72 hours. CBG: Recent Labs  Lab 09/12/18 1136 09/12/18 1603 09/12/18 2118 09/13/18 0738 09/13/18 1145  GLUCAP 231* 175* 137* 186* 218*   Lipid Profile: No results for input(s): CHOL, HDL, LDLCALC, TRIG, CHOLHDL, LDLDIRECT in the last 72 hours. Thyroid Function Tests: No results for input(s): TSH, T4TOTAL, FREET4, T3FREE, THYROIDAB in the last 72 hours. Anemia Panel: No results for input(s): VITAMINB12, FOLATE, FERRITIN, TIBC, IRON, RETICCTPCT in the last 72 hours. Urine analysis:    Component Value Date/Time   COLORURINE AMBER (A) 09/08/2018 0255   APPEARANCEUR HAZY (A) 09/08/2018 0255   LABSPEC 1.016 09/08/2018 0255   PHURINE 6.0 09/08/2018 0255    GLUCOSEU NEGATIVE 09/08/2018 0255   HGBUR NEGATIVE 09/08/2018 0255   BILIRUBINUR NEGATIVE 09/08/2018 0255  [place] NEGATIVE 09/08/2018 0255   PROTEINUR NEGATIVE 09/08/2018 0255   NITRITE POSITIVE (A) 09/08/2018 0255   LEUKOCYTESUR NEGATIVE 09/08/2018 0255     Amee Boothe M.D. [facility] 09/13/2018, 2:11 PM  Pager: [phone] Between 7am to 7pm - call Pager - [phone]  After 7pm go to [email or web address] - password [email or web address]  Call night coverage person covering after 7pm

## 2018-09-14 DIAGNOSIS — C7931 Secondary malignant neoplasm of brain: Secondary | ICD-10-CM | POA: Diagnosis present

## 2018-09-14 DIAGNOSIS — Z515 Encounter for palliative care: Secondary | ICD-10-CM

## 2018-09-14 LAB — BASIC METABOLIC PANEL
Anion gap: 7 (ref 5–15)
BUN: 13 mg/dL (ref 6–20)
CO2: 29 mmol/L (ref 22–32)
Calcium: 8.3 mg/dL — ABNORMAL LOW (ref 8.9–10.3)
Chloride: 100 mmol/L (ref 98–111)
Creatinine, Ser: <0.3 mg/dL — ABNORMAL LOW (ref 0.44–1.00)
Glucose, Bld: 102 mg/dL — ABNORMAL HIGH (ref 70–99)
Potassium: 3.8 mmol/L (ref 3.5–5.1)
Sodium: 136 mmol/L (ref 135–145)

## 2018-09-14 LAB — CBC
HCT: 26.3 % — ABNORMAL LOW (ref 36.0–46.0)
Hemoglobin: 8.5 g/dL — ABNORMAL LOW (ref 12.0–15.0)
MCH: 29.8 pg (ref 26.0–34.0)
MCHC: 32.3 g/dL (ref 30.0–36.0)
MCV: 92.3 fL (ref 80.0–100.0)
Platelets: 16 10*3/uL — CL (ref 150–400)
RBC: 2.85 MIL/uL — ABNORMAL LOW (ref 3.87–5.11)
RDW: 16.3 % — ABNORMAL HIGH (ref 11.5–15.5)
WBC: 2.5 10*3/uL — ABNORMAL LOW (ref 4.0–10.5)
nRBC: 0 % (ref 0.0–0.2)

## 2018-09-14 LAB — GLUCOSE, CAPILLARY
Glucose-Capillary: 141 mg/dL — ABNORMAL HIGH (ref 70–99)
Glucose-Capillary: 89 mg/dL (ref 70–99)
Glucose-Capillary: 85 mg/dL (ref 70–99)
Glucose-Capillary: 101 mg/dL — ABNORMAL HIGH (ref 70–99)
Glucose-Capillary: 123 mg/dL — ABNORMAL HIGH (ref 70–99)

## 2018-09-14 LAB — PREALBUMIN: Prealbumin: <5 mg/dL — ABNORMAL LOW (ref 18–38)

## 2018-09-14 LAB — VITAMIN B12: Vitamin B-12: 2745 pg/mL — ABNORMAL HIGH (ref 180–914)

## 2018-09-14 LAB — PHOSPHORUS: Phosphorus: 3.6 mg/dL (ref 2.5–4.6)

## 2018-09-14 LAB — FOLATE: Folate: 9.6 ng/mL (ref >5.9)

## 2018-09-14 NOTE — Consult Note (Signed)
Consultation Note Date: 09/14/2018   Patient Name: Stacy Shelton  DOB: [birth year]  MRN: [id number]  Age / Sex: 58 y.o., female  PCP: Robyne Peers, MD Referring Physician: Mendel Corning, MD  Reason for Consultation: Establishing goals of care and Psychosocial/spiritual support  HPI/Patient Profile: 58 y.o. female  with past medical history of type two diabetes, asthma, and a diagnosis of metastatic breast cancer (brain, bone, liver) as of February 2020, who was admitted on 08/30/2018 with septic shock from UTI and a CBG of 19.   Clinical Assessment and Goals of Care:  I have reviewed medical records including EPIC notes, labs and imaging, received report from the bedside RN, assessed the patient and attempted to speak with her about her concerns and wishes.  After a short introduction and a few questions the patient said "I wish you would just let me go."  I then called her sister and mother.  We discussed diagnosis prognosis, disposition and options.  I introduced Palliative Medicine as specialized medical care for people living with serious illness. It focuses on providing relief from the symptoms and stress of a serious illness.  We discussed a brief life review of the patient. Her family describes Stacy Shelton as very generous and smart.  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  Her career is extremely important to her.  She never married and has no children.  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  Stacy Shelton believes in God and prays but she is not a Marketing executive".  Stacy Shelton commented that each time Stacy Shelton receives another health set back or even just a bad lab result - she takes it as a personal failure.  "I have failed again"  As far as functional and nutritional status Stacy Shelton has been eating poorly. She as refused physical therapy at home and in the hospital.   During this admission Stacy Shelton is eating very little and today has not eaten at all.  Stacy Shelton has multiple wounds including an unstageable sacral wound, heal wound and thigh wound.  These are quite painful.  Per the RN she is unable to read her books as her hands are too weak to hold them up.  Stacy Shelton even refuses repositioning in bed.   Stacy Shelton's sister and mother are perplexed.  They simply want Stacy Shelton to tell them how to help her.  We discussed whether or not Stacy Shelton has capacity (I believe she does).  They suggested we provide her with artificial nutrition even if she refuses it.  I explained why that is not possible.   The family initially seemed focused on encouraging Stacy Shelton to fight the cancer - I suggested to them that they may help her thru acceptance and understanding of Stacy Shelton's condition.  At the conclusion of the conversation the three of [place] agreed that Stacy Shelton has a need for control in her life.  The cancer has taken control from her.  We would like to find a way to return some  control to her.  The family would like to have a meeting - 8/17 if possible - with Palliative and Stacy Shelton, Stacy Shelton (sister), Stacy Shelton (mother) and Stacy Shelton (best friend) to attempt to elicit reasonable goals of care from Stacy Shelton and develop a plan to support her going forward.  Questions and concerns were addressed.  The family was encouraged to call with questions or concerns.   Primary Decision Maker:  PATIENT    SUMMARY OF RECOMMENDATIONS    PMT will arrange a [place] meeting with family, patient, and Oncology when all parties are available.  Code Status/Advance Care Planning:  Full code - to be discussed.   Symptom Management:   Patient refused to discuss symptoms  Additional Recommendations (Limitations, Scope, Preferences):  Full Scope Treatment  Palliative Prophylaxis:   Frequent Pain Assessment  Psycho-social/Spiritual:   Desire for further Chaplaincy support:  Welcomed.  Prognosis:   Difficult to determine.   At her current rate of decline with an inability to take chemo she likely has weeks to months.  If she continues to refuse to eat or move her prognosis is days to weeks.  Discharge Planning: To Be Determined  Likely to home with appropriate support services.      Primary Diagnoses: Present on Admission: . Acute encephalopathy . Anemia associated with chemotherapy . Metastatic breast cancer ([place]) . Uncontrolled diabetes mellitus with hyperglycemia ([place]) . Decubitus ulcers . Acute lower UTI . Pancytopenia, acquired ([place]) . Thrombocytopenia ([place]) . Sepsis ([place])   I have reviewed the medical record, interviewed the patient and family, and examined the patient. The following aspects are pertinent.  Past Medical History:  Diagnosis Date  . Asthma   . Diabetes ([place])   . Metastatic breast cancer ([place])   . Seasonal allergies    Social History   Socioeconomic History  . Marital status: Single    Spouse name: Not on file  . Number of children: Not on file  . Years of education: Not on file  . Highest education level: Not on file  Occupational History  . Occupation: Technical brewer  . Financial resource strain: Not on file  . Food insecurity    Worry: Not on file    Inability: Not on file  . Transportation needs    Medical: Not on file    Non-medical: Not on file  Tobacco Use  . Smoking status: Never Smoker  . Smokeless tobacco: Never Used  Substance and Sexual Activity  . Alcohol use: Yes    Alcohol/week: 0.0 standard drinks    Comment: socially  . Drug use: No  . Sexual activity: Not on file  Lifestyle  . Physical activity    Days per week: Not on file    Minutes per session: Not on file  . Stress: Not on file  Relationships  . Social Herbalist on phone: Not on file    Gets together: Not on file    Attends religious service: Not on file    Active member of club or organization: Not on file    Attends meetings of clubs or organizations:  Not on file    Relationship status: Not on file  Other Topics Concern  . Not on file  Social History Narrative  . Not on file   Family History  Problem Relation Age of Onset  . Allergic rhinitis Neg Hx   . Angioedema Neg Hx   . Asthma Neg Hx   . Atopy Neg Hx   .  Eczema Neg Hx   . Immunodeficiency Neg Hx   . Urticaria Neg Hx    Scheduled Meds: . amiodarone  200 mg Oral Daily  . budesonide (PULMICORT) nebulizer solution  0.25 mg Nebulization BID  . Chlorhexidine Gluconate Cloth  6 each Topical Daily  . feeding supplement (GLUCERNA SHAKE)  237 mL Oral Q4H  . feeding supplement (PRO-STAT SUGAR FREE 64)  30 mL Oral TID  . insulin aspart  0-5 Units Subcutaneous QHS  . insulin aspart  0-9 Units Subcutaneous TID WC  . insulin detemir  13 Units Subcutaneous QHS  . mouth rinse  15 mL Mouth Rinse BID  . montelukast  10 mg Oral QHS  . multivitamin with minerals  1 tablet Oral Daily  . nutrition supplement (JUVEN)  1 packet Oral BID BM  . pantoprazole  40 mg Oral Daily  . sodium chloride flush  10-40 mL Intracatheter Q12H   Continuous Infusions: . ampicillin (OMNIPEN) IV Stopped (09/14/18 1203)   PRN Meds:.acetaminophen, albuterol, lidocaine-prilocaine, morphine, oxyCODONE-acetaminophen, sodium chloride flush Allergies  Allergen Reactions  . Nsaids Anaphylaxis   Review of Systems patient denied all review of system questions ( no constipation, no insomnia, no anorexia, no SOB, no pain)  Physical Exam patient refused exam.  Vital Signs: BP (!) 113/34   Pulse (!) 105   Temp 98.9 F (37.2 C) (Oral)   Resp (!) 27   Ht 5\' 2"  (1.575 m)   Wt 59.2 kg   LMP 10/30/2010   SpO2 100%   BMI 23.87 kg/m  Pain Scale: 0-10   Pain Score: 7    SpO2: SpO2: 100 % O2 Device:SpO2: 100 % O2 Flow Rate: .O2 Flow Rate (L/min): 2 L/min  IO: Intake/output summary:   Intake/Output Summary (Last 24 hours) at 09/14/2018 1819 Last data filed at 09/14/2018 0644 Gross per 24 hour  Intake 540 ml   Output 1750 ml  Net -1210 ml    LBM: Last BM Date: 09/12/18 Baseline Weight: Weight: 57.4 kg Most recent weight: Weight: 59.2 kg     Palliative Assessment/Data: 20%     Time In: 4:00 Time Out: 5:30 Time Total: 90 min. Visit consisted of counseling and education dealing with the complex and emotionally intense issues surrounding the need for palliative care and symptom management in the setting of serious and potentially life-threatening illness. Greater than 50%  of this time was spent counseling and coordinating care related to the above assessment and plan.  Signed by: Florentina Jenny, PA-C Palliative Medicine Pager: [phone]  Please contact Palliative Medicine Team phone at [phone] for questions and concerns.  For individual provider: See Shea Evans

## 2018-09-14 NOTE — Progress Notes (Addendum)
[facility]                                                                              Patient Demographics  Stacy Shelton, is a 58 y.o. female, DOB - 12-25-1960, YSA:[id number]  Admit date - 09/11/2018   Admitting Physician Bonnell Public, MD  Outpatient Primary MD for the patient is Robyne Peers, MD  Outpatient specialists:   LOS - 7  days   Medical records reviewed and are as summarized below:    No chief complaint on file.      Brief summary   Patient is a 58 year old Caucasian female with past medical history of metastatic breast cancer, diabetes mellitus type 2, asthma was brought to hospital after altered mental status.  Found to be hypoglycemic with blood glucose 19.  She was started on broad-spectrum antibiotic for infectious etiology.  Also patient found to be severely anemic and thrombocytopenic.  Transfused  PRBC and platelets. Patient was hypotensive and required brief Levophed infusion on 8/10, off on 8/11  Assessment & Plan    Principal Problem:   Sepsis ([name]) secondary to UTI with septic shock -Patient met sepsis criteria at the time of admission with low-grade fever, hypotension, tachycardia, tachypnea, hypoxia, source likely due to UTI. -Briefly required vasopressors, Levophed was weaned off on 8/11 -Urine culture showed enterococcus, continue IV ampicillin total of 7 days, will finish antibiotics tonight.    Active Problems: Acute enterococcus UTI -Urine culture showed more than 100,000 colonies of Enterococcus faecalis, initially started on broad-spectrum antibiotics on IV vancomycin and cefepime -Continue IV ampicillin, total 7 days including IV vancomycin, completing tonight    Pancytopenia with thrombocytopenia (HCC) -Appears to be refractory to transfusions, likely due to bone marrow involvement, chemo.  Initially thought to have worsened due to antibiotics but vancomycin and cefepime were discontinued with no significant  improvement.  - patient had presented with hemoglobin of 6.6, had received 3 units packed RBCs, also had received 1 unit of platelets, received 1 unit platelets pheresis on 8/12, 8/13, 8/14.  Received 1 packed RBC transfusion on 8/13 -Platelets down to 16 K today (<-- 30K on 8/15)    Uncontrolled diabetes mellitus with hyperglycemia (HCC) -CBGs better controlled today continue Levemir 13 units, SSI.  -Diabetic coordinator consulted    Metastatic breast cancer ([place]) -Dr. Alvy Bimler following, continue supportive care for now, not ready to resume chemotherapy  Hypoalbuminemia, moderate degree malnutrition, severe deconditioning -Albumin 1.4, added pro-stat, oral intake remains poor    Acute encephalopathy -Intermittent waxing and waning mental status, possibly due to acute UTI, critical illness -CT head 8/13 negative for any bleed or stroke -Alert and oriented x3, fairly close to her baseline  History of mild diastolic dysfunction -2D echo 5/20 had shown EF of 60 to 65%, left ventricular diastolic function could not be ascertained - continue to monitor I's and O's  Multiple decub ulcers -Per nursing documentation Bilateral Ear pressure injuries, Stage 1 (POA); Upper back pressure injury Stage 2, (POA); Sacral pressure injury, unstageable (POA); Left Heel pressure injury, unstageable (POA); Right thigh pressure injury, unstagable (POA) -Agree, continue wound care, continue PT  Deconditioning, debility -Patient needs motivation to continue PT.  Code Status: Full CODE STATUS DVT Prophylaxis:  SCD's Family Communication: Discussed in detail with the patient, all imaging results, lab results explained to patient's Sister Stacy Shelton.  Patient sister wants a care plan, talked about feeding tube or TPN. Explained to patient sister that patient has no swallowing issues, refusing to eat, not participating in PT which is leading to more debility.  Overall poor prognosis with transfusion refractory  pancytopenia, thrombocytopenia, metastatic breast CA, multiple decub ulcers, failure to thrive.  I strongly recommended to have goals of care with palliative medicine and respecting [name]'s wishes.   Stacy Shelton agrees with the plan, palliative medicine consult placed.   Disposition Plan: High risk of deterioration with multiple medical problems, sepsis, UTI, profound thrombocytopenia, remains in stepdown today  Time Spent in minutes 35 minutes  Procedures:  None  Consultants:   Oncology, Dr. Alvy Bimler  Antimicrobials:   Anti-infectives (From admission, onward)   Start     Dose/Rate Route Frequency Ordered Stop   09/10/18 1200  ampicillin (OMNIPEN) 1 g in sodium chloride 0.9 % 100 mL IVPB     1 g 300 mL/hr over 20 Minutes Intravenous Every 6 hours 09/10/18 0822 09/14/18 2359   09/09/18 0300  vancomycin (VANCOCIN) IVPB 1000 mg/200 mL premix  Status:  Discontinued     1,000 mg 200 mL/hr over 60 Minutes Intravenous Daily 09/08/18 0323 09/10/18 0752   09/08/18 0600  ceFEPIme (MAXIPIME) 2 g in sodium chloride 0.9 % 100 mL IVPB  Status:  Discontinued     2 g 200 mL/hr over 30 Minutes Intravenous Every 8 hours 09/08/18 0323 09/10/18 0752   09/09/2018 1900  ceFEPIme (MAXIPIME) 2 g in sodium chloride 0.9 % 100 mL IVPB     2 g 200 mL/hr over 30 Minutes Intravenous  Once 09/04/2018 1858 08/31/2018 2120   09/01/2018 1900  metroNIDAZOLE (FLAGYL) IVPB 500 mg     500 mg 100 mL/hr over 60 Minutes Intravenous  Once 09/03/2018 1858 09/20/2018 2228   09/15/2018 1900  vancomycin (VANCOCIN) IVPB 1000 mg/200 mL premix     1,000 mg 200 mL/hr over 60 Minutes Intravenous  Once 09/13/2018 1858 09/08/18 0221         Medications  Scheduled Meds:  amiodarone  200 mg Oral Daily   budesonide (PULMICORT) nebulizer solution  0.25 mg Nebulization BID   Chlorhexidine Gluconate Cloth  6 each Topical Daily   feeding supplement (GLUCERNA SHAKE)  237 mL Oral Q4H   feeding supplement (PRO-STAT SUGAR FREE 64)  30 mL Oral TID     insulin aspart  0-5 Units Subcutaneous QHS   insulin aspart  0-9 Units Subcutaneous TID WC   insulin detemir  13 Units Subcutaneous QHS   mouth rinse  15 mL Mouth Rinse BID   montelukast  10 mg Oral QHS   multivitamin with minerals  1 tablet Oral Daily   nutrition supplement (JUVEN)  1 packet Oral BID BM   pantoprazole  40 mg Oral Daily   sodium chloride flush  10-40 mL Intracatheter Q12H   Continuous Infusions:  ampicillin (OMNIPEN) IV Stopped (09/14/18 0559)   PRN Meds:.acetaminophen, albuterol, lidocaine-prilocaine, morphine, oxyCODONE-acetaminophen, sodium chloride flush      Subjective:   Rori Goar was seen and examined today.  No acute issues, no nausea vomiting fevers or chills. Patient denies dizziness, chest pain, shortness of breath, abdominal pain.   Objective:   Vitals:   09/14/18 0400 09/14/18 0600 09/14/18 7341  09/14/18 0800  BP: (!) 117/40 (!) 132/42  (!) 119/38  Pulse: 100 (!) 104  98  Resp: (!) 24 (!) 30  (!) 24  Temp:    98.1 F (36.7 C)  TempSrc:    Oral  SpO2: 100% 100%  100%  Weight:   59.2 kg   Height:        Intake/Output Summary (Last 24 hours) at 09/14/2018 1110 Last data filed at 09/14/2018 0644 Gross per 24 hour  Intake 832.5 ml  Output 1750 ml  Net -917.5 ml     Wt Readings from Last 3 Encounters:  09/14/18 59.2 kg  08/06/18 55.8 kg  07/28/18 55.8 kg   Physical Exam  General: Alert and oriented x 3, NAD, frail and ill-appearing  Eyes:   HEENT:  Atraumatic, normocephalic  Cardiovascular: S1 S2 clear, RRR.  1+ pedal edema b/l  Respiratory: CTAB, no wheezing, rales or rhonchi  Gastrointestinal: Soft, nontender, nondistended, NBS  Ext: 1+ pedal edema bilaterally  Neuro: no new deficits  Musculoskeletal:   Skin: No rashes  Psych: Normal affect and demeanor, alert and oriented x3    Data Reviewed:  I have personally reviewed following labs and imaging studies  Micro Results Recent Results (from the past  240 hour(s))  Culture, blood (routine x 2)     Status: None   Collection Time: 09/26/2018  6:06 PM   Specimen: BLOOD  Result Value Ref Range Status   Specimen Description   Final    BLOOD PICC LINE Performed at [facility] [address], [place], [place] [address]    Special Requests   Final    BOTTLES DRAWN AEROBIC AND ANAEROBIC Blood Culture adequate volume Performed at [facility] [address] [place], [place] [address]    Culture   Final    NO GROWTH 5 DAYS Performed at [facility], [place] [address], [place], [place] [address]    Report Status 09/12/2018 FINAL  Final  SARS Coronavirus 2 [facility] order, Performed in [facility] hospital lab) Nasopharyngeal Nasopharyngeal Swab     Status: None   Collection Time: 09/25/2018  6:11 PM   Specimen: Nasopharyngeal Swab  Result Value Ref Range Status   SARS Coronavirus 2 NEGATIVE NEGATIVE Final    Comment: (NOTE) If result is NEGATIVE SARS-CoV-2 target nucleic acids are NOT DETECTED. The SARS-CoV-2 RNA is generally detectable in upper and lower  respiratory specimens during the acute phase of infection. The lowest  concentration of SARS-CoV-2 viral copies this assay can detect is 250  copies / mL. A negative result does not preclude SARS-CoV-2 infection  and should not be used as the sole basis for treatment or other  patient management decisions.  A negative result may occur with  improper specimen collection / handling, submission of specimen other  than nasopharyngeal swab, presence of viral mutation(s) within the  areas targeted by this assay, and inadequate number of viral copies  (<250 copies / mL). A negative result must be combined with clinical  observations, patient history, and epidemiological information. If result is POSITIVE SARS-CoV-2 target nucleic acids are DETECTED. The SARS-CoV-2 RNA is generally detectable in upper and lower  respiratory specimens dur ing the  acute phase of infection.  Positive  results are indicative of active infection with SARS-CoV-2.  Clinical  correlation with patient history and other diagnostic information is  necessary to determine patient infection status.  Positive results do  not rule out bacterial infection  or co-infection with other viruses. If result is PRESUMPTIVE POSTIVE SARS-CoV-2 nucleic acids MAY BE PRESENT.   A presumptive positive result was obtained on the submitted specimen  and confirmed on repeat testing.  While 2019 novel coronavirus  (SARS-CoV-2) nucleic acids may be present in the submitted sample  additional confirmatory testing may be necessary for epidemiological  and / or clinical management purposes  to differentiate between  SARS-CoV-2 and other Sarbecovirus currently known to infect humans.  If clinically indicated additional testing with an alternate test  methodology [phone]) is advised. The SARS-CoV-2 RNA is generally  detectable in upper and lower respiratory sp ecimens during the acute  phase of infection. The expected result is Negative. Fact Sheet for Patients:  StrictlyIdeas.no Fact Sheet for Healthcare Providers: BankingDealers.co.za This test is not yet approved or cleared by the [place] FDA and has been authorized for detection and/or diagnosis of SARS-CoV-2 by FDA under an Emergency Use Authorization (EUA).  This EUA will remain in effect (meaning this test can be used) for the duration of the COVID-19 declaration under Section 564(b)(1) of the Act, 21 U.S.C. section 360bbb-3(b)(1), unless the authorization is terminated or revoked sooner. Performed at [facility], [place] [address], [place], [place] [address]   MRSA PCR Screening     Status: None   Collection Time: 09/06/2018 11:25 PM   Specimen: Nasal Mucosa; Nasopharyngeal  Result Value Ref Range Status   MRSA by PCR NEGATIVE NEGATIVE Final    Comment:         The GeneXpert MRSA Assay (FDA approved for NASAL specimens only), is one component of a comprehensive MRSA colonization surveillance program. It is not intended to diagnose MRSA infection nor to guide or monitor treatment for MRSA infections. Performed at [facility], [place] [address], [place], [place] [address]   Culture, blood (routine x 2)     Status: None   Collection Time: 09/08/18 12:31 AM   Specimen: BLOOD LEFT HAND  Result Value Ref Range Status   Specimen Description   Final    BLOOD LEFT HAND Performed at [facility], [place] [address], [place], [place] [address]    Special Requests   Final    BOTTLES DRAWN AEROBIC ONLY Blood Culture results may not be optimal due to an inadequate volume of blood received in culture bottles Performed at [facility] [address], [place], [place] [address]    Culture   Final    NO GROWTH 5 DAYS Performed at [facility], [place] [address], [place], [place] [address]    Report Status 09/13/2018 FINAL  Final  Urine culture     Status: Abnormal   Collection Time: 09/08/18  2:55 AM   Specimen: Urine, Random  Result Value Ref Range Status   Specimen Description   Final    URINE, RANDOM Performed at [facility] [address] [place], [place] [address]    Special Requests   Final    NONE Performed at [facility], [place] [address] [place], [place] [address]    Culture >=100,000 COLONIES/mL ENTEROCOCCUS FAECALIS (A)  Final   Report Status 09/10/2018 FINAL  Final   Organism ID, Bacteria ENTEROCOCCUS FAECALIS (A)  Final      Susceptibility   Enterococcus faecalis - MIC*    AMPICILLIN <=2 SENSITIVE Sensitive     LEVOFLOXACIN 2 SENSITIVE Sensitive     NITROFURANTOIN <=16 SENSITIVE Sensitive  VANCOMYCIN 1 SENSITIVE Sensitive     * >=100,000 COLONIES/mL ENTEROCOCCUS FAECALIS    Radiology Reports Ct Head Wo Contrast  Result Date:  09/11/2018 CLINICAL DATA:  Altered level of consciousness. Metastatic breast cancer. EXAM: CT HEAD WITHOUT CONTRAST TECHNIQUE: Contiguous axial images were obtained from the base of the skull through the vertex without intravenous contrast. COMPARISON:  CT head 04/08/2018, MRI head 07/17/2018 FINDINGS: Brain: Previously noted edema in the right occipital lobe has resolved. No new area of edema or hemorrhage. Mild atrophy appears to have progressed. Mild patchy white matter hypodensity also has progressed. Question brain radiation. Vascular: Negative for hyperdense vessel Skull: Negative Sinuses/Orbits: Prior sinus surgery. Mucoperiosteal thickening in the maxillary sinus bilaterally. Negative orbit. Other: None IMPRESSION: 1. Interval resolution of edema in the right occipital lobe related to metastatic disease on prior studies. Current study was done without intravenous contrast and is not adequate to evaluate for residual or recurrent metastatic disease. 2. Progression of atrophy and white matter changes since the prior study. Question related to brain radiation. Electronically Signed   By: Franchot Gallo M.D.   On: 09/11/2018 12:04   Dg Chest Portable 1 View  Result Date: 09/06/2018 CLINICAL DATA:  58 year old female with weakness. History of metastatic breast cancer. EXAM: PORTABLE CHEST 1 VIEW COMPARISON:  Chest radiograph dated 06/22/2018 and CT dated 06/11/2018 FINDINGS: There is shallow inspiration with bibasilar atelectasis. A 4.4 cm rounded opacity in the right upper lobe corresponds to density seen on the prior CT and may represent metastatic disease or primary lung cancer. Additional smaller 14 mm nodular density noted in the right upper lobe more laterally. There is probable small bilateral pleural effusions. No pneumothorax. There is mild cardiomegaly. Atherosclerotic calcification of aortic arch. Right sided Port-A-Cath with tip in the region of the cavoatrial junction. No acute osseous pathology.  IMPRESSION: 1. Small bilateral pleural effusions and bibasilar atelectasis or infiltrate. 2. Right upper lobe rounded opacity corresponding to the density seen on the prior CT. An additional 14 mm right upper lobe pulmonary nodule noted. Electronically Signed   By: Anner Crete M.D.   On: 09/06/2018 19:07   Vas [place] Upper Extremity Venous Duplex  Result Date: 09/11/2018 UPPER VENOUS STUDY  Indications: Pain, Swelling, and Severe bruising Limitations: Unable to position the patient for optimal imaging. Comparison Study: No previous study available for comparison Performing Technologist: Toma Copier RVS  Examination Guidelines: A complete evaluation includes B-mode imaging, spectral Doppler, color Doppler, and power Doppler as needed of all accessible portions of each vessel. Bilateral testing is considered an integral part of a complete examination. Limited examinations for reoccurring indications may be performed as noted.  Right Findings: +----------+------------+---------+-----------+----------+--------------+  RIGHT      Compressible Phasicity Spontaneous Properties    Summary      +----------+------------+---------+-----------+----------+--------------+  IJV                                                      Not visualized  +----------+------------+---------+-----------+----------+--------------+  Subclavian                                               Not visualized  +----------+------------+---------+-----------+----------+--------------+ Unable to Visulize the IJV or subclavian due  to position of the patient and neck  Left Findings: +----------+------------+---------+-----------+----------+--------------------+  LEFT       Compressible Phasicity Spontaneous Properties       Summary         +----------+------------+---------+-----------+----------+--------------------+  IJV            Full        Yes        Yes                    Difficult to                                                                      visualize        +----------+------------+---------+-----------+----------+--------------------+  Subclavian                 Yes        Yes                    Difficult to                                                                     visualize        +----------+------------+---------+-----------+----------+--------------------+  Axillary       Full                                                            +----------+------------+---------+-----------+----------+--------------------+  Brachial       Full        Yes        Yes                                      +----------+------------+---------+-----------+----------+--------------------+  Radial         Full                                       Difficult to image   +----------+------------+---------+-----------+----------+--------------------+  Ulnar          Full                                       Difficult to image   +----------+------------+---------+-----------+----------+--------------------+  Cephalic       Full                                                            +----------+------------+---------+-----------+----------+--------------------+  Basilic        Full                                                            +----------+------------+---------+-----------+----------+--------------------+  Summary:  Right: Unable to visualize the IJV and subclavian due to position of the patient and neck.  Left: This was a limited study. There is no obvious evidence of DVT. However limited visualization due to sub optimal postioning of the arm due to pain.  *See table(s) above for measurements and observations.  Diagnosing physician: Curt Jews MD Electronically signed by Curt Jews MD on 09/11/2018 at 7:12:31 AM.    Final     Lab Data:  CBC: Recent Labs  Lab 09/01/2018 1805  09/10/18 1522  09/11/18 1735 09/12/18 0300 09/12/18 1606 09/12/18 1910 09/13/18 0500 09/14/18 0520  WBC 5.8   < > 3.1*   < > 2.9* 2.6*  --  2.3*  2.2* 2.5*  NEUTROABS 3.6  --  1.6*  --  1.5*  --   --  1.1*  --   --   HGB 6.6*   < > 9.0*   < > 9.5* 9.2* 8.9* 8.6* 8.2* 8.5*  HCT 21.0*   < > 27.0*   < > 29.0* 28.4* 27.3* 26.9* 25.7* 26.3*  MCV 90.5   < > 86.8   < > 89.2 89.9  --  90.6 91.5 92.3  PLT 5*   < > 10*   < > 9* 7*  --  45* 30* 16*   < > = values in this interval not displayed.   Basic Metabolic Panel: Recent Labs  Lab 09/08/18 0031  09/10/18 0511 09/11/18 0537 09/12/18 0300 09/13/18 0500 09/14/18 0520  NA 133*   < > 130* 132* 133* 136 136  K 4.2   < > 3.5 3.1* 3.8 3.1* 3.8  CL 98   < > 99 100 102 101 100  CO2 24   < > _0 GLUCOSE 118*   < > 211* 99 222* 178* 102*  BUN 11   < > _1 CREATININE 0.48   < > 0.34* 0.37* 0.37* 0.32* <0.30*  CALCIUM 7.8*   < > 7.5* 7.7* 8.0* 8.2* 8.3*  MG 1.7  --   --   --   --   --   --   PHOS 3.6  --   --   --   --   --   --    < > = values in this interval not displayed.   GFR: CrCl cannot be calculated (This lab value cannot be used to calculate CrCl because it is not a number: <0.30). Liver Function Tests: Recent Labs  Lab 09/15/2018 1858 09/09/18 0525 09/10/18 0511  AST 23 13* 24  ALT _2 ALKPHOS 176* 183* 353*  BILITOT 1.8* 2.2* 1.8*  PROT 5.5* 5.3* 5.3*  ALBUMIN 1.7* 1.6* 1.4*   No results for input(s): LIPASE, AMYLASE in the last 168 hours. No results for input(s): AMMONIA in the last 168 hours. Coagulation Profile: Recent Labs  Lab 09/16/2018 1858  INR 1.5*   Cardiac Enzymes: No results for input(s): CKTOTAL, CKMB, CKMBINDEX, TROPONINI in the last 168 hours. BNP (last 3 results) No  results for input(s): PROBNP in the last 8760 hours. HbA1C: No results for input(s): HGBA1C in the last 72 hours. CBG: Recent Labs  Lab 09/13/18 0738 09/13/18 1145 09/13/18 1638 09/13/18 2132 09/14/18 0808  GLUCAP 186* 218* 181* 141* 89   Lipid Profile: No results for input(s): CHOL, HDL, LDLCALC, TRIG, CHOLHDL, LDLDIRECT in the last 72  hours. Thyroid Function Tests: No results for input(s): TSH, T4TOTAL, FREET4, T3FREE, THYROIDAB in the last 72 hours. Anemia Panel: No results for input(s): VITAMINB12, FOLATE, FERRITIN, TIBC, IRON, RETICCTPCT in the last 72 hours. Urine analysis:    Component Value Date/Time   COLORURINE AMBER (A) 09/08/2018 0255   APPEARANCEUR HAZY (A) 09/08/2018 0255   LABSPEC 1.016 09/08/2018 0255   PHURINE 6.0 09/08/2018 0255   GLUCOSEU NEGATIVE 09/08/2018 0255   HGBUR NEGATIVE 09/08/2018 0255   BILIRUBINUR NEGATIVE 09/08/2018 0255   KETONESUR NEGATIVE 09/08/2018 0255   PROTEINUR NEGATIVE 09/08/2018 0255   NITRITE POSITIVE (A) 09/08/2018 0255   LEUKOCYTESUR NEGATIVE 09/08/2018 0255     Briyan Kleven M.D. [facility] 09/14/2018, 11:10 AM  Pager: [phone] Between 7am to 7pm - call Pager - [phone]  After 7pm go to [email or web address] - password [email or web address]  Call night coverage person covering after 7pm

## 2018-09-15 DIAGNOSIS — C799 Secondary malignant neoplasm of unspecified site: Secondary | ICD-10-CM | POA: Diagnosis present

## 2018-09-15 DIAGNOSIS — Z7189 Other specified counseling: Secondary | ICD-10-CM

## 2018-09-15 DIAGNOSIS — Z515 Encounter for palliative care: Secondary | ICD-10-CM

## 2018-09-15 LAB — CBC
HCT: 24.2 % — ABNORMAL LOW (ref 36.0–46.0)
Hemoglobin: 7.7 g/dL — ABNORMAL LOW (ref 12.0–15.0)
MCH: 29.6 pg (ref 26.0–34.0)
MCHC: 31.8 g/dL (ref 30.0–36.0)
MCV: 93.1 fL (ref 80.0–100.0)
Platelets: 7 10*3/uL — CL (ref 150–400)
RBC: 2.6 MIL/uL — ABNORMAL LOW (ref 3.87–5.11)
RDW: 16.3 % — ABNORMAL HIGH (ref 11.5–15.5)
WBC: 2 10*3/uL — ABNORMAL LOW (ref 4.0–10.5)
nRBC: 0 % (ref 0.0–0.2)

## 2018-09-15 LAB — COMPREHENSIVE METABOLIC PANEL
ALT: 15 U/L (ref 0–44)
AST: 11 U/L — ABNORMAL LOW (ref 15–41)
Albumin: 1.5 g/dL — ABNORMAL LOW (ref 3.5–5.0)
Alkaline Phosphatase: 211 U/L — ABNORMAL HIGH (ref 38–126)
Anion gap: 9 (ref 5–15)
BUN: 14 mg/dL (ref 6–20)
CO2: 29 mmol/L (ref 22–32)
Calcium: 8.1 mg/dL — ABNORMAL LOW (ref 8.9–10.3)
Chloride: 99 mmol/L (ref 98–111)
Creatinine, Ser: <0.3 mg/dL — ABNORMAL LOW (ref 0.44–1.00)
Glucose, Bld: 57 mg/dL — ABNORMAL LOW (ref 70–99)
Potassium: 3.5 mmol/L (ref 3.5–5.1)
Sodium: 137 mmol/L (ref 135–145)
Total Bilirubin: 1.7 mg/dL — ABNORMAL HIGH (ref 0.3–1.2)
Total Protein: 5.5 g/dL — ABNORMAL LOW (ref 6.5–8.1)

## 2018-09-15 LAB — GLUCOSE, CAPILLARY
Glucose-Capillary: 42 mg/dL — CL (ref 70–99)
Glucose-Capillary: 42 mg/dL — CL (ref 70–99)
Glucose-Capillary: 169 mg/dL — ABNORMAL HIGH (ref 70–99)
Glucose-Capillary: 101 mg/dL — ABNORMAL HIGH (ref 70–99)

## 2018-09-15 LAB — PREPARE RBC (CROSSMATCH)

## 2018-09-15 LAB — MAGNESIUM: Magnesium: 1.9 mg/dL (ref 1.7–2.4)

## 2018-09-15 MED ORDER — HYDROMORPHONE HCL 1 MG/ML IJ SOLN
0.5000 mg | INTRAMUSCULAR | Status: DC | PRN
  Administered 2018-09-16 – 2018-09-20 (×14): 0.5 mg via INTRAVENOUS
  Filled 2018-09-15 (×14): qty 0.5

## 2018-09-15 MED ORDER — GLYCOPYRROLATE 1 MG PO TABS
1.0000 mg | ORAL_TABLET | ORAL | Status: DC | PRN
  Filled 2018-09-15: qty 1

## 2018-09-15 MED ORDER — LORAZEPAM 2 MG/ML IJ SOLN
0.5000 mg | Freq: Once | INTRAMUSCULAR | Status: AC
  Administered 2018-09-15: 0.5 mg via INTRAVENOUS
  Filled 2018-09-15: qty 1

## 2018-09-15 MED ORDER — GLYCOPYRROLATE 0.2 MG/ML IJ SOLN
0.2000 mg | INTRAMUSCULAR | Status: DC | PRN
  Filled 2018-09-15: qty 1

## 2018-09-15 MED ORDER — DIPHENHYDRAMINE HCL 25 MG PO CAPS
25.0000 mg | ORAL_CAPSULE | Freq: Once | ORAL | Status: AC
  Administered 2018-09-15: 25 mg via ORAL
  Filled 2018-09-15: qty 1

## 2018-09-15 MED ORDER — HALOPERIDOL LACTATE 2 MG/ML PO CONC
0.5000 mg | ORAL | Status: DC | PRN
  Filled 2018-09-15: qty 0.3

## 2018-09-15 MED ORDER — INSULIN DETEMIR 100 UNIT/ML ~~LOC~~ SOLN
10.0000 [IU] | Freq: Every day | SUBCUTANEOUS | Status: DC
  Filled 2018-09-15: qty 0.1

## 2018-09-15 MED ORDER — LORAZEPAM 1 MG PO TABS: 1.0000 mg | ORAL_TABLET | ORAL | Status: DC | PRN

## 2018-09-15 MED ORDER — BIOTENE DRY MOUTH MT LIQD: 15.0000 mL | OROMUCOSAL | Status: DC | PRN

## 2018-09-15 MED ORDER — POLYVINYL ALCOHOL 1.4 % OP SOLN: 1.0000 [drp] | Freq: Four times a day (QID) | OPHTHALMIC | Status: DC | PRN

## 2018-09-15 MED ORDER — LORAZEPAM 2 MG/ML IJ SOLN
0.5000 mg | INTRAMUSCULAR | Status: DC | PRN
  Administered 2018-09-15 – 2018-09-21 (×12): 0.5 mg via INTRAVENOUS
  Filled 2018-09-15 (×13): qty 1

## 2018-09-15 MED ORDER — HALOPERIDOL LACTATE 5 MG/ML IJ SOLN
1.0000 mg | INTRAMUSCULAR | Status: DC | PRN
  Administered 2018-09-16 – 2018-09-21 (×2): 1 mg via INTRAVENOUS
  Filled 2018-09-15 (×2): qty 1

## 2018-09-15 MED ORDER — ACETAMINOPHEN 325 MG PO TABS
650.0000 mg | ORAL_TABLET | Freq: Once | ORAL | Status: AC
  Administered 2018-09-15: 650 mg via ORAL

## 2018-09-15 MED ORDER — LORAZEPAM 2 MG/ML PO CONC: 1.0000 mg | ORAL | Status: DC | PRN

## 2018-09-15 MED ORDER — HALOPERIDOL 0.5 MG PO TABS
0.5000 mg | ORAL_TABLET | ORAL | Status: DC | PRN
  Filled 2018-09-15: qty 1

## 2018-09-15 MED ORDER — SODIUM CHLORIDE 0.9% IV SOLUTION: Freq: Once | INTRAVENOUS | Status: DC

## 2018-09-15 MED ORDER — DEXTROSE 50 % IV SOLN
INTRAVENOUS | Status: AC
  Administered 2018-09-15: 50 mL
  Filled 2018-09-15: qty 50

## 2018-09-15 NOTE — Progress Notes (Signed)
Late Entry- CBG 44 this AM at 0730 Orange Juice 8 OZ given  Repeat CBG done still 44 D 50 1 Amp given CBG now 169.  Received call from Laureen Ochs this am Asking for information what has sister "put in her mouth since yesterday".  Orders for I & 0 only.  Calorie count ended on the 14th.  Note from Dietitian shared with Sister.  Not able to tell sister the type of fluids patient took in on yesterday and last PM.  Sister very concerned with her nutrition needs .

## 2018-09-15 NOTE — Progress Notes (Signed)
Chaplain consult in response to RN referral.    Providing support around progression of illness / goals of care.  Family gathered at bedside.  (Mother, sister, friend).   Chaplain engaged family in narrative work - providing space for mother to express fears, shock, desires for peace, strength and care for Sharnise.   Mother requested prayers with family.  Chaplain shared prayers for peace, strength, comfort and honor for family's love for one another.     Ara has had contact with cancer center support center chaplain.  This chaplain will alert CC chaplain that Rosezetta is inpatient.   Jerene Pitch, MDiv, [facility]

## 2018-09-15 NOTE — Progress Notes (Signed)
Inpatient Diabetes Program Recommendations  AACE/ADA: New Consensus Statement on Inpatient Glycemic Control (2015)  Target Ranges:  Prepandial:   less than 140 mg/dL      Peak postprandial:   less than 180 mg/dL (1-2 hours)      Critically ill patients:  140 - 180 mg/dL   Lab Results  Component Value Date   GLUCAP 101 (H) 09/15/2018   HGBA1C 6.7 (H) 06/11/2018    Review of Glycemic Control  Hypoglycemia of 57, 42 mg/dL this am. On Levemir 13 units QHS.  Inpatient Diabetes Program Recommendations:     Reduce Levemir to 10 units QHS.  Will follow closely.  Thank you. Lorenda Peck, RD, LDN, CDE Inpatient Diabetes Coordinator [phone]

## 2018-09-15 NOTE — TOC Transition Note (Signed)
Transition of Care [facility]) - CM/SW Discharge Note   Patient Details  Name: Stacy Shelton MRN: [id number] Date of Birth: 1960-08-07  Transition of Care [facility]) CM/SW Contact:  Lynnell Catalan, RN Phone Number: 09/15/2018, 3:20 PM   Clinical Narrative:     This CM was contacted by PMT to initiate [facility] place referral. This CM contacted Venia Carbon and gave referral. Anderson Malta informed that pt mother is the point of contact.                 Readmission Risk Interventions Readmission Risk Prevention Plan 09/08/2018 06/13/2018 04/25/2018  Transportation Screening Complete Complete -  Medication Review Press photographer) Complete Complete -  PCP or Specialist appointment within 3-5 days of discharge Not Complete Not Complete (No Data)  PCP/Specialist Appt Not Complete comments not ready to dc not close to discharge -  Katie or Home Care Consult Complete Complete -  SW Recovery Care/Counseling Consult Complete Not Complete -  SW Consult Not Complete Comments - no evidence of need at this time -  Palliative Care Screening Not Applicable Not Applicable -  [employer] Not Applicable Not Applicable -  Some recent data might be hidden

## 2018-09-15 NOTE — Progress Notes (Addendum)
Manufacturing engineer [facility])  Referral received for residential hospice at [facility], from [facility].  [facility] will have a bed to offer Stacy Shelton on 8/18.  Margaretha Seeds, mother and NOK, left her a message asking for return call.  Will update [facility] manager tomorrow once necessary consents are obtained so transport can be arranged to [facility].  Venia Carbon RN, BSN, [facility] Liaison (in [place]) [phone]  **530p-Update, spoke to family, they were leaving the hospital and needed some time to digest all of the information they received today.  Requested a call in the am to discuss [facility].  Will update TOC manager once decision has been made.

## 2018-09-15 NOTE — Progress Notes (Signed)
[facility]                                                                              Patient Demographics  Stacy Shelton, is a 58 y.o. female, DOB - [birth year], NLZ:[id number]  Admit date - 09/28/2018   Admitting Physician Bonnell Public, MD  Outpatient Primary MD for the patient is Robyne Peers, MD  Outpatient specialists:   LOS - 8  days   Medical records reviewed and are as summarized below:    No chief complaint on file.      Brief summary   Patient is a 58 year old Caucasian female with past medical history of metastatic breast cancer, diabetes mellitus type 2, asthma was brought to hospital after altered mental status.  Found to be hypoglycemic with blood glucose 19.  She was started on broad-spectrum antibiotic for infectious etiology.  Also patient found to be severely anemic and thrombocytopenic.  Transfused  PRBC and platelets. Patient was hypotensive and required brief Levophed infusion on 8/10, off on 8/11  Assessment & Plan    Principal Problem:   Sepsis ([place]) secondary to UTI with septic shock -Patient met sepsis criteria at the time of admission with low-grade fever, hypotension, tachycardia, tachypnea, hypoxia, source likely due to UTI. -Briefly required vasopressors, Levophed was weaned off on 8/11 -Urine culture showed enterococcus, finished IV ampicillin on 8/16 midnight, total 7 days including vancomycin  Active Problems: Acute enterococcus UTI -Urine culture showed more than 100,000 colonies of Enterococcus faecalis, initially started on broad-spectrum antibiotics on IV vancomycin and cefepime -Completed IV ampicillin   Pancytopenia with thrombocytopenia (HCC) -Appears to be refractory to transfusions, likely due to bone marrow involvement, chemo.  Initially thought to have worsened due to antibiotics but vancomycin and cefepime were discontinued with no significant improvement.  - patient had presented with hemoglobin of 6.6,  had received 3 units packed RBCs, also had received 1 unit of platelets, received 1 unit platelets pheresis on 8/12, 8/13, 8/14.  Received 1 packed RBC transfusion on 8/13 -Platelets down to [place] today (16K on 8/16<-- 30K on 8/15) -Plan for 1 unit platelet pheresis transfusion today    Uncontrolled diabetes mellitus with hyperglycemia ([place]) -Now with hypoglycemia secondary to poor oral intake, decrease Levemir to 10 units nightly -Diabetic coordinator consulted    Metastatic breast cancer ([place]) -Dr. Alvy Bimler following, continue supportive care for now, not ready to resume chemotherapy  Hypoalbuminemia, moderate degree malnutrition, severe deconditioning -Albumin 1.4, added pro-stat, oral intake remains poor    Acute encephalopathy -Intermittent waxing and waning mental status, possibly due to acute UTI, critical illness -CT head 8/13 negative for any bleed or stroke -Confused, appreciate oncology commendations, no repeat imaging as it will not change the management  History of mild diastolic dysfunction -2D echo 5/20 had shown EF of 60 to 65%, left ventricular diastolic function could not be ascertained - continue to monitor I's and O's  Multiple decub ulcers -Per nursing documentation Bilateral Ear pressure injuries, Stage 1 (POA); Upper back pressure injury Stage 2, (POA); Sacral pressure injury, unstageable (POA); Left Heel pressure injury, unstageable (POA); Right thigh pressure injury, unstagable (POA) -  Agree, continue wound care, continue PT  Deconditioning, debility -Patient has not done PT in the last 3 weeks, very deconditioned with myopathy of critical illness  Code Status: DNR DVT Prophylaxis:  SCD's Family Communication: Goals of care meeting with palliative medicine and oncology today, CODE STATUS DNR   Disposition Plan:   Time Spent in minutes 25  Procedures:  None  Consultants:   Oncology, Dr. Alvy Bimler Palliative medicine  Antimicrobials:   Anti-infectives  (From admission, onward)   Start     Dose/Rate Route Frequency Ordered Stop   09/10/18 1200  ampicillin (OMNIPEN) 1 g in sodium chloride 0.9 % 100 mL IVPB     1 g 300 mL/hr over 20 Minutes Intravenous Every 6 hours 09/10/18 0822 09/14/18 1909   09/09/18 0300  vancomycin (VANCOCIN) IVPB 1000 mg/200 mL premix  Status:  Discontinued     1,000 mg 200 mL/hr over 60 Minutes Intravenous Daily 09/08/18 0323 09/10/18 0752   09/08/18 0600  ceFEPIme (MAXIPIME) 2 g in sodium chloride 0.9 % 100 mL IVPB  Status:  Discontinued     2 g 200 mL/hr over 30 Minutes Intravenous Every 8 hours 09/08/18 0323 09/10/18 0752   09/13/2018 1900  ceFEPIme (MAXIPIME) 2 g in sodium chloride 0.9 % 100 mL IVPB     2 g 200 mL/hr over 30 Minutes Intravenous  Once 09/24/2018 1858 09/16/2018 2120   09/22/2018 1900  metroNIDAZOLE (FLAGYL) IVPB 500 mg     500 mg 100 mL/hr over 60 Minutes Intravenous  Once 09/16/2018 1858 09/02/2018 2228   09/16/2018 1900  vancomycin (VANCOCIN) IVPB 1000 mg/200 mL premix     1,000 mg 200 mL/hr over 60 Minutes Intravenous  Once 08/30/2018 1858 09/08/18 0221         Medications  Scheduled Meds:  sodium chloride   Intravenous Once   amiodarone  200 mg Oral Daily   budesonide (PULMICORT) nebulizer solution  0.25 mg Nebulization BID   Chlorhexidine Gluconate Cloth  6 each Topical Daily   feeding supplement (GLUCERNA SHAKE)  237 mL Oral Q4H   feeding supplement (PRO-STAT SUGAR FREE 64)  30 mL Oral TID   insulin aspart  0-5 Units Subcutaneous QHS   insulin aspart  0-9 Units Subcutaneous TID WC   insulin detemir  10 Units Subcutaneous QHS   mouth rinse  15 mL Mouth Rinse BID   montelukast  10 mg Oral QHS   multivitamin with minerals  1 tablet Oral Daily   nutrition supplement (JUVEN)  1 packet Oral BID BM   pantoprazole  40 mg Oral Daily   sodium chloride flush  10-40 mL Intracatheter Q12H   Continuous Infusions:  PRN Meds:.acetaminophen, albuterol, lidocaine-prilocaine, morphine,  oxyCODONE-acetaminophen, sodium chloride flush      Subjective:   Stacy Shelton was seen and examined today.  Confused, low-grade temp, no acute issues overnight.  Very debilitated, poor oral intake  Objective:   Vitals:   09/15/18 1243 09/15/18 1245 09/15/18 1255 09/15/18 1310  BP:  (!) 136/48  (!) 134/59  Pulse:  (!) 117 (!) 119 (!) 121  Resp:  (!) 23 20 (!) 25  Temp: 99 F (37.2 C)   99.6 F (37.6 C)  TempSrc: Oral   Axillary  SpO2:  94% (!) 88% 92%  Weight:      Height:        Intake/Output Summary (Last 24 hours) at 09/15/2018 1405 Last data filed at 09/15/2018 1200 Gross per 24 hour  Intake 1048.99 ml  Output 1150 ml  Net -101.01 ml     Wt Readings from Last 3 Encounters:  09/14/18 59.2 kg  08/06/18 55.8 kg  07/28/18 55.8 kg   Physical Exam  General: Alert but somewhat confused, frail and ill-appearing, oriented to self  Eyes:   HEENT:   Cardiovascular: S1 S2 clear, RRR  Respiratory: CTAB, no wheezing, rales or rhonchi  Gastrointestinal: Soft, nontender, nondistended, NBS  Ext: 1+ pedal edema bilaterally  Neuro:   Musculoskeletal: No cyanosis, clubbing  Skin: Multiple decub's with dressings  Psych: Confused   Data Reviewed:  I have personally reviewed following labs and imaging studies  Micro Results Recent Results (from the past 240 hour(s))  Culture, blood (routine x 2)     Status: None   Collection Time: 09/08/2018  6:06 PM   Specimen: BLOOD  Result Value Ref Range Status   Specimen Description   Final    BLOOD PICC LINE Performed at [facility], [place] [address], [place], [place] [address]    Special Requests   Final    BOTTLES DRAWN AEROBIC AND ANAEROBIC Blood Culture adequate volume Performed at [place] [address], [place], [place] [address]    Culture   Final    NO GROWTH 5 DAYS Performed at [facility], [place] [address], [place], [place] [address]    Report Status 09/12/2018  FINAL  Final  SARS Coronavirus 2 [facility] order, Performed in [facility] hospital lab) Nasopharyngeal Nasopharyngeal Swab     Status: None   Collection Time: 09/13/2018  6:11 PM   Specimen: Nasopharyngeal Swab  Result Value Ref Range Status   SARS Coronavirus 2 NEGATIVE NEGATIVE Final    Comment: (NOTE) If result is NEGATIVE SARS-CoV-2 target nucleic acids are NOT DETECTED. The SARS-CoV-2 RNA is generally detectable in upper and lower  respiratory specimens during the acute phase of infection. The lowest  concentration of SARS-CoV-2 viral copies this assay can detect is 250  copies / mL. A negative result does not preclude SARS-CoV-2 infection  and should not be used as the sole basis for treatment or other  patient management decisions.  A negative result may occur with  improper specimen collection / handling, submission of specimen other  than nasopharyngeal swab, presence of viral mutation(s) within the  areas targeted by this assay, and inadequate number of viral copies  (<250 copies / mL). A negative result must be combined with clinical  observations, patient history, and epidemiological information. If result is POSITIVE SARS-CoV-2 target nucleic acids are DETECTED. The SARS-CoV-2 RNA is generally detectable in upper and lower  respiratory specimens dur ing the acute phase of infection.  Positive  results are indicative of active infection with SARS-CoV-2.  Clinical  correlation with patient history and other diagnostic information is  necessary to determine patient infection status.  Positive results do  not rule out bacterial infection or co-infection with other viruses. If result is PRESUMPTIVE POSTIVE SARS-CoV-2 nucleic acids MAY BE PRESENT.   A presumptive positive result was obtained on the submitted specimen  and confirmed on repeat testing.  While 2019 novel coronavirus  (SARS-CoV-2) nucleic acids may be present in the submitted sample  additional confirmatory  testing may be necessary for epidemiological  and / or clinical management purposes  to differentiate between  SARS-CoV-2 and other Sarbecovirus currently known to infect humans.  If clinically indicated additional testing with an alternate test  methodology [phone]) is advised. The SARS-CoV-2 RNA is  generally  detectable in upper and lower respiratory sp ecimens during the acute  phase of infection. The expected result is Negative. Fact Sheet for Patients:  StrictlyIdeas.no Fact Sheet for Healthcare Providers: BankingDealers.co.za This test is not yet approved or cleared by the [place] FDA and has been authorized for detection and/or diagnosis of SARS-CoV-2 by FDA under an Emergency Use Authorization (EUA).  This EUA will remain in effect (meaning this test can be used) for the duration of the COVID-19 declaration under Section 564(b)(1) of the Act, 21 U.S.C. section 360bbb-3(b)(1), unless the authorization is terminated or revoked sooner. Performed at [facility], [place] [address], [place], [place] [address]   MRSA PCR Screening     Status: None   Collection Time: 09/14/2018 11:25 PM   Specimen: Nasal Mucosa; Nasopharyngeal  Result Value Ref Range Status   MRSA by PCR NEGATIVE NEGATIVE Final    Comment:        The GeneXpert MRSA Assay (FDA approved for NASAL specimens only), is one component of a comprehensive MRSA colonization surveillance program. It is not intended to diagnose MRSA infection nor to guide or monitor treatment for MRSA infections. Performed at [facility], [place] [address] [place], [place] [address]   Culture, blood (routine x 2)     Status: None   Collection Time: 09/08/18 12:31 AM   Specimen: BLOOD LEFT HAND  Result Value Ref Range Status   Specimen Description   Final    BLOOD LEFT HAND Performed at [facility], [place] [address], [place], [place] [address]     Special Requests   Final    BOTTLES DRAWN AEROBIC ONLY Blood Culture results may not be optimal due to an inadequate volume of blood received in culture bottles Performed at [place] [address], [place], [place] [address]    Culture   Final    NO GROWTH 5 DAYS Performed at [facility], [place] [address], [place], [place] [address]    Report Status 09/13/2018 FINAL  Final  Urine culture     Status: Abnormal   Collection Time: 09/08/18  2:55 AM   Specimen: Urine, Random  Result Value Ref Range Status   Specimen Description   Final    URINE, RANDOM Performed at [place] [address] [place], [place] [address]    Special Requests   Final    NONE Performed at [facility], [place] [address], [place], [place] [address]    Culture >=100,000 COLONIES/mL ENTEROCOCCUS FAECALIS (A)  Final   Report Status 09/10/2018 FINAL  Final   Organism ID, Bacteria ENTEROCOCCUS FAECALIS (A)  Final      Susceptibility   Enterococcus faecalis - MIC*    AMPICILLIN <=2 SENSITIVE Sensitive     LEVOFLOXACIN 2 SENSITIVE Sensitive     NITROFURANTOIN <=16 SENSITIVE Sensitive     VANCOMYCIN 1 SENSITIVE Sensitive     * >=100,000 COLONIES/mL ENTEROCOCCUS FAECALIS    Radiology Reports Ct Head Wo Contrast  Result Date: 09/11/2018 CLINICAL DATA:  Altered level of consciousness. Metastatic breast cancer. EXAM: CT HEAD WITHOUT CONTRAST TECHNIQUE: Contiguous axial images were obtained from the base of the skull through the vertex without intravenous contrast. COMPARISON:  CT head 04/08/2018, MRI head 07/17/2018 FINDINGS: Brain: Previously noted edema in the right occipital lobe has resolved. No new area of edema or hemorrhage. Mild atrophy appears to have progressed. Mild patchy white matter hypodensity also  has progressed. Question brain radiation. Vascular: Negative for hyperdense vessel Skull: Negative Sinuses/Orbits: Prior sinus surgery.  Mucoperiosteal thickening in the maxillary sinus bilaterally. Negative orbit. Other: None IMPRESSION: 1. Interval resolution of edema in the right occipital lobe related to metastatic disease on prior studies. Current study was done without intravenous contrast and is not adequate to evaluate for residual or recurrent metastatic disease. 2. Progression of atrophy and white matter changes since the prior study. Question related to brain radiation. Electronically Signed   By: Franchot Gallo M.D.   On: 09/11/2018 12:04   Dg Chest Portable 1 View  Result Date: 08/30/2018 CLINICAL DATA:  58 year old female with weakness. History of metastatic breast cancer. EXAM: PORTABLE CHEST 1 VIEW COMPARISON:  Chest radiograph dated 06/22/2018 and CT dated 06/11/2018 FINDINGS: There is shallow inspiration with bibasilar atelectasis. A 4.4 cm rounded opacity in the right upper lobe corresponds to density seen on the prior CT and may represent metastatic disease or primary lung cancer. Additional smaller 14 mm nodular density noted in the right upper lobe more laterally. There is probable small bilateral pleural effusions. No pneumothorax. There is mild cardiomegaly. Atherosclerotic calcification of aortic arch. Right sided Port-A-Cath with tip in the region of the cavoatrial junction. No acute osseous pathology. IMPRESSION: 1. Small bilateral pleural effusions and bibasilar atelectasis or infiltrate. 2. Right upper lobe rounded opacity corresponding to the density seen on the prior CT. An additional 14 mm right upper lobe pulmonary nodule noted. Electronically Signed   By: Anner Crete M.D.   On: 09/22/2018 19:07   Vas [place] Upper Extremity Venous Duplex  Result Date: 09/11/2018 UPPER VENOUS STUDY  Indications: Pain, Swelling, and Severe bruising Limitations: Unable to position the patient for optimal imaging. Comparison Study: No previous study available for comparison Performing Technologist: Toma Copier RVS   Examination Guidelines: A complete evaluation includes B-mode imaging, spectral Doppler, color Doppler, and power Doppler as needed of all accessible portions of each vessel. Bilateral testing is considered an integral part of a complete examination. Limited examinations for reoccurring indications may be performed as noted.  Right Findings: +----------+------------+---------+-----------+----------+--------------+  RIGHT      Compressible Phasicity Spontaneous Properties    Summary      +----------+------------+---------+-----------+----------+--------------+  IJV                                                      Not visualized  +----------+------------+---------+-----------+----------+--------------+  Subclavian                                               Not visualized  +----------+------------+---------+-----------+----------+--------------+ Unable to Visulize the IJV or subclavian due to position of the patient and neck  Left Findings: +----------+------------+---------+-----------+----------+--------------------+  LEFT       Compressible Phasicity Spontaneous Properties       Summary         +----------+------------+---------+-----------+----------+--------------------+  IJV            Full        Yes        Yes                    Difficult to  visualize        +----------+------------+---------+-----------+----------+--------------------+  Subclavian                 Yes        Yes                    Difficult to                                                                     visualize        +----------+------------+---------+-----------+----------+--------------------+  Axillary       Full                                                            +----------+------------+---------+-----------+----------+--------------------+  Brachial       Full        Yes        Yes                                       +----------+------------+---------+-----------+----------+--------------------+  Radial         Full                                       Difficult to image   +----------+------------+---------+-----------+----------+--------------------+  Ulnar          Full                                       Difficult to image   +----------+------------+---------+-----------+----------+--------------------+  Cephalic       Full                                                            +----------+------------+---------+-----------+----------+--------------------+  Basilic        Full                                                            +----------+------------+---------+-----------+----------+--------------------+  Summary:  Right: Unable to visualize the IJV and subclavian due to position of the patient and neck.  Left: This was a limited study. There is no obvious evidence of DVT. However limited visualization due to sub optimal postioning of the arm due to pain.  *See table(s) above for measurements and observations.  Diagnosing physician: Curt Jews MD Electronically signed by Curt Jews MD on 09/11/2018 at 7:12:31 AM.    Final     Lab Data:  CBC: Recent Labs  Lab 09/10/18 1522  09/11/18 1735 09/12/18 0300 09/12/18 1606 09/12/18 1910 09/13/18 0500 09/14/18 0520 09/15/18 0500  WBC 3.1*   < > 2.9* 2.6*  --  2.3* 2.2* 2.5* 2.0*  NEUTROABS 1.6*  --  1.5*  --   --  1.1*  --   --   --   HGB 9.0*   < > 9.5* 9.2* 8.9* 8.6* 8.2* 8.5* 7.7*  HCT 27.0*   < > 29.0* 28.4* 27.3* 26.9* 25.7* 26.3* 24.2*  MCV 86.8   < > 89.2 89.9  --  90.6 91.5 92.3 93.1  PLT 10*   < > 9* 7*  --  45* 30* 16* 7*   < > = values in this interval not displayed.   Basic Metabolic Panel: Recent Labs  Lab 09/11/18 0537 09/12/18 0300 09/13/18 0500 09/14/18 0520 09/14/18 1744 09/15/18 0500  NA 132* 133* 136 136  --  137  K 3.1* 3.8 3.1* 3.8  --  3.5  CL 100 102 101 100  --  99  CO2 '25 26 28 29  '$ --  29  GLUCOSE 99 222* 178*  102*  --  57*  BUN '12 20 16 13  '$ --  14  CREATININE 0.37* 0.37* 0.32* <0.30*  --  <0.30*  CALCIUM 7.7* 8.0* 8.2* 8.3*  --  8.1*  MG  --   --   --   --   --  1.9  PHOS  --   --   --   --  3.6  --    GFR: CrCl cannot be calculated (This lab value cannot be used to calculate CrCl because it is not a number: <0.30). Liver Function Tests: Recent Labs  Lab 09/09/18 0525 09/10/18 0511 09/15/18 0500  AST 13* 24 11*  ALT '14 25 15  '$ ALKPHOS 183* 353* 211*  BILITOT 2.2* 1.8* 1.7*  PROT 5.3* 5.3* 5.5*  ALBUMIN 1.6* 1.4* 1.5*   No results for input(s): LIPASE, AMYLASE in the last 168 hours. No results for input(s): AMMONIA in the last 168 hours. Coagulation Profile: No results for input(s): INR, PROTIME in the last 168 hours. Cardiac Enzymes: No results for input(s): CKTOTAL, CKMB, CKMBINDEX, TROPONINI in the last 168 hours. BNP (last 3 results) No results for input(s): PROBNP in the last 8760 hours. HbA1C: No results for input(s): HGBA1C in the last 72 hours. CBG: Recent Labs  Lab 09/14/18 2144 09/15/18 0729 09/15/18 0802 09/15/18 0833 09/15/18 1145  GLUCAP 123* 42* 42* 169* 101*   Lipid Profile: No results for input(s): CHOL, HDL, LDLCALC, TRIG, CHOLHDL, LDLDIRECT in the last 72 hours. Thyroid Function Tests: No results for input(s): TSH, T4TOTAL, FREET4, T3FREE, THYROIDAB in the last 72 hours. Anemia Panel: Recent Labs    09/14/18 1744  VITAMINB12 2,745*  FOLATE 9.6   Urine analysis:    Component Value Date/Time   COLORURINE AMBER (A) 09/08/2018 0255   APPEARANCEUR HAZY (A) 09/08/2018 0255   LABSPEC 1.016 09/08/2018 0255   PHURINE 6.0 09/08/2018 0255   GLUCOSEU NEGATIVE 09/08/2018 0255   HGBUR NEGATIVE 09/08/2018 0255   BILIRUBINUR NEGATIVE 09/08/2018 [place] 09/08/2018 0255   PROTEINUR NEGATIVE 09/08/2018 0255   NITRITE POSITIVE (A) 09/08/2018 0255   LEUKOCYTESUR NEGATIVE 09/08/2018 0255     Asha Grumbine M.D. [facility] 09/15/2018,  2:05 PM  Pager: [phone] Between 7am to 7pm - call Pager - [phone]  After 7pm go to [email or web address] - password [facility]  Call  night coverage person covering after 7pm

## 2018-09-15 NOTE — Progress Notes (Addendum)
Stacy Shelton   DOB:[birth year]   GB#:[id number]   The patient is seen and examined.  I agree with the documentation as follows ASSESSMENT & PLAN:  Metastatic breast cancer ([place]) She has significant complication from each cycle of treatment despite dose adjustment Due to her acute mental status change, I have extensive discussion with the patient and family members about goals of care The patient is unlikely going to improve to the point of being strong enough to receive further treatment.  We will focus on supportive care only  Altered mental status Likely due to metabolic encephalopathy versus disease progression in her brain She is currently not competent to make medical decision and her mother is her healthcare power of attorney I do not recommend repeat imaging study as it would not change management  Acquired pancytopenia Received 2 units of packed red blood cells on 09/08/2018 and 1 unit on 09/11/2018 Hemoglobin down to 7.7 today Transfuse 1 unit PRBC Platelets are 7000 today; no bleeding reported Transfuse 1 unit of platelets today Continue to monitor blood counts carefully Her thrombocytopenia could be due to broad-spectrum IV antibiotics, progression of disease, recent bone marrow suppression from hospitalization, consumption from decubitus ulcers and malnutrition IV antibiotics changed to ampicillin on 09/10/2018 - completed 09/14/2018 The patient is platelet refractory Because of this, she cannot be discharged from the hospital I am hopeful, now that her antibiotics are discontinued, after waiting 3 to 5 days, we might see platelet count improvement For now, I do not recommend bone marrow biopsy as it would not change management  UTI, E. Coli Completed a course of ampicillin IV  Insulin dependent diabetes Continue insulin sliding scale She is noted to have hypoglycemia due to poor oral intake  Hypokalemia Potassium is normalized today Closely monitor  Decubitus ulcers She  has significant pain secondary to multiple new decubitus ulcer Wound care is consulted  Malnutrition of moderate degree She has significant malnutrition We have extensive discussion about taking Remeron and nutritional supplement as tolerated I have requested nursing staff to offer her nutritional supplement every 2-3 hours while awake I also recommend calorie count; documentation of calorie intake was reviewed The patient appears to be motivated to increase oral intake as tolerated I did not identify any barriers for her from eating I do not recommend feeding tube placement or total parenteral nutritional support The patient has no swallowing difficulties and denies nausea  Physical debility, critical myopathy She has not participated with physical therapy for over 3 weeks PT/OT has been consulted I reinforced the importance of getting physical therapy and Occupational Therapy in order to get better 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 I suggested Psychiatry consult. Patient declined  Code Status After much discussion with the patient's mother, she is in agreement to change CODE STATUS to DNR  Goals of care I had numerous goals of care discussion with the patient and family I spent 1 hour discussing goals of care with her sister, mother and best friend Palliative care team is also present We discussed extensively about her current medical condition.  With her altered mental status, her mother will be her healthcare power of attorney.  She appears overwhelmed.  After much discussion, she is in agreement to change her CODE STATUS.  We have discussed about the role of transfusion support.  She  is undecided.  Discharge planning Due to platelet refractory, she is not a candidate to be discharged Based on her  current progressive decline of clinical condition, she would likely be transitioned to comfort measures with palliative care/hospice over the next 2 to 3 days  All questions were answered.    Mikey Bussing, NP 09/15/2018 9:40 AM  [name], MD  Subjective:  The patient is alert.  She repeatedly told us to let her go and wants to go to hospice.  However, upon further questioning, the patient appears not fully oriented. Nursing reports hypoglycemic episode this am No bleeding noted  Oncology History Overview Note  Biopsy is ER negative Her2/neu positive   Metastatic breast cancer ([place])  03/11/2018 Imaging   Ct scan of chest, abdomen and pelvis 1. 2.6 x 2.3 cm spiculated mass identified inferomedial quadrant of the left breast. Lesion appears to retract the overlying skin. Mammographic correlation recommended. 2. Small lymph nodes in the left axillar ill-defined and suspicious for metastatic disease. 3. Multiple bilateral pulmonary nodules measuring up to 2.7 cm diameter. These probably represent metastatic disease. Given the dominant size of the central right upper lobe lesion, synchronous lung primary can not be completely excluded. 4. Multiple ill-defined liver lesions compatible with metastatic disease. Dominant liver metastases measure up to almost 4 cm. 5.  Aortic Atherosclerois (ICD10-170.0)    03/12/2018 Pathology Results   Liver, needle/core biopsy, left lobe - METASTATIC CARCINOMA. - LYMPHOVASCULAR INVASION IS IDENTIFIED. - SEE COMMENT. Microscopic Comment The tumor cells are positive for cytokeratin 7. There is faint staining for GATA-3. There is non-specific staining for TTF-1. Cytokeratin 20, CDX-2, estrogen receptor, and GCDFP stains are negative. Given the clinical suspicion, the profile supports a primary breast carcinoma. Her2 will be performed and the results reported separately.  By immunohistochemistry, the tumor cells are POSITIVE for Her2 (3+).   03/12/2018  Imaging   Single 1.3 x 1.4 cm RIGHT occipital metastasis.  Borderline pachymeningeal enhancement of symmetric nature could be related to the superficial metastasis or osseous disease.   03/12/2018 Procedure   Ultrasound-guided core biopsy performed of a mass within the lateral segment of the left lobe of the liver.   03/16/2018 Imaging   Right occipital lobe 13 x 16 mm. Small satellite enhancing nodule deep to the larger lesion. The larger lesion shows central necrosis and probable mild hemorrhage or calcification.  Mild dural enhancement is less impressive and could be within normal limits.  Bone marrow in the clivus and cervical spine is diffusely low signal. No focal lesion. This may be related to the patient's anemia and abnormal blood count.   03/17/2018 Cancer Staging   Staging form: Breast, AJCC 8th Edition - Clinical: Stage IV (cT2, cN0, pM1, GX, ER-, PR: Not Assessed, HER2+) - Signed by [name], MD on 03/17/2018   03/19/2018 -  Chemotherapy   She received chemo with Taxotere, Herceptin and Perjeta   03/21/2018 Echocardiogram   IMPRESSIONS 1. The left ventricle has normal systolic function with an ejection fraction of 60-65%. The cavity size was normal. Left ventricular diastolic Doppler parameters are consistent with impaired relaxation.  2. The right ventricle has normal systolic function. The cavity was normal. There is no increase in right ventricular wall thickness.  3. The mitral valve is normal in structure.  4. The tricuspid valve is normal in structure.  5. Strain imaging performed but not reported due to interpreter judgement, secondary to suboptimal image quality.   03/24/2018 Procedure   Successful placement  of a right IJ approach Power Port with ultrasound and fluoroscopic guidance. The catheter is ready for use.   04/07/2018 - 04/16/2018 Hospital Admission   She was admitted for pneumonia   04/07/2018 - 04/16/2018 Hospital Admission   She was admitted to the  hospital for pneumonia and respiratory failure   04/25/2018 Imaging   Retropharyngeal fluid collection compatible with effusion or possibly abscess. Soft tissue thickening extends into the right neck and surrounds the right carotid bifurcation and right internal carotid artery. There is also streaky density in the right lateral neck soft tissues with a focal 7.6 Mm lymph node in the right lateral neck. Stranding surrounds the right submandibular gland.   Findings are most compatible with infection. Favor pharyngitis with retropharyngeal effusion/abscess. Soft tissue thickening around the right carotid most compatible with infection rather than tumor. Right jugular vein is patent.  Multiple nodular densities in the right upper lobe may represent residual pneumonia or metastatic disease. Interval improvement in right upper lobe infiltrate compared with CT of 04/08/2018   04/25/2018 - 05/06/2018 Hospital Admission   She was admitted for retropharyngeal abscess   04/27/2018 Imaging   No evidence of large central pulmonary embolus is noted in the main pulmonary artery or the main portions of the right and left pulmonary arteries. However, evaluation of the lower lobe branches, particularly on the right, is limited due to respiratory motion artifact and other limiting issues. Pulmonary emboli and smaller peripheral branches can not be excluded on the basis of this exam.  Large left pleural effusion is noted with complete atelectasis of the left lower lobe. Increased left upper lobe opacity is noted posteriorly concerning for atelectasis or pneumonia.  Improved right upper lobe and lower lobe opacities are noted suggesting improving pneumonia or atelectasis.  15 mm right paratracheal lymph node is noted which is enlarged compared to prior exam; it is uncertain if this is metastatic or inflammatory in etiology.  Hepatic metastatic lesions are again noted.  Aortic Atherosclerosis (ICD10-I70.0).     04/27/2018 Procedure   Successful ultrasound guided left thoracentesis yielding 120 mL of blood-tinged of pleural fluid.   06/11/2018 Imaging   1. No evidence of pulmonary emboli. 2. Extensive bilateral lower lobe consolidation. 3. Small pleural effusions. 4. 2.5 cm right upper lobe nodule, enlarged from 04/27/2018 and which may reflect a metastasis or primary lung cancer. Stable to mild enlargement of subcentimeter nodules in both upper lobes consistent with known metastases. 5. Decreased size of liver metastases. 6. Unchanged mild mediastinal and right hilar lymphadenopathy. 7. Aortic Atherosclerosis (ICD10-I70.0).   06/11/2018 - 06/22/2018 Hospital Admission   She was hospitalized for infection and SVT   07/18/2018 Imaging   MRI brain 1. Regression of solitary right occipital lobe metastasis and edema. No new brain metastasis. 2. New ill-defined enhancement at the atlantooccipital membrane, presumably a metastatic focus. The adjacent bone marrow is abnormally low signal but not enhancing to the degree of the extraosseous lesion. Marrow signal is likely from anemia.     Metastasis to brain ([place])  03/17/2018 Initial Diagnosis   Metastasis to brain [facility])   Metastasis to liver ([place])  03/17/2018 Initial Diagnosis   Metastasis to liver [facility])   Metastasis to bone ([place])  03/17/2018 Initial Diagnosis   Metastasis to bone ([facility])      Objective:  Vitals:   09/15/18 0600 09/15/18 0800  BP: (!) 106/38 (!) 107/40  Pulse: 92 91  Resp: 20 (!) 21  Temp:  97.9 F (36.6 C)  SpO2: 94% 94%     Intake/Output Summary (Last 24 hours) at 09/15/2018 0940 Last data filed at 09/15/2018 0800 Gross per 24 hour  Intake 888.99 ml  Output 1150 ml  Net -261.01 ml    GENERAL:alert, no distress and comfortable SKIN: Scattered ecchymotic areas to her arms and legs EYES: normal, Conjunctiva are pink and non-injected, sclera clear OROPHARYNX:no exudate, no erythema and lips, buccal mucosa, and tongue  normal  NECK: supple, thyroid normal size, non-tender, without nodularity LYMPH:  no palpable lymphadenopathy in the cervical, axillary or inguinal LUNGS: clear to auscultation and percussion with normal breathing effort HEART: regular rate & rhythm and no murmurs and no lower extremity edema ABDOMEN:abdomen soft, non-tender and normal bowel sounds Musculoskeletal:no cyanosis of digits and no clubbing  NEURO: alert but not oriented with fluent speech, she has severe critical myelopathy.  She is able to move her hand and wriggle her toes   Labs:  Recent Labs    05/06/18 0430  09/09/18 0525 09/10/18 0511  09/13/18 0500 09/14/18 0520 09/15/18 0500  NA 138   < > 130* 130*   < > 136 136 137  K 3.6   < > 3.0* 3.5   < > 3.1* 3.8 3.5  CL 99   < > 98 99   < > 101 100 99  CO2 32   < > 24 24   < > _0 GLUCOSE 138*   < > 250* 211*   < > 178* 102* 57*  BUN 14   < > 12 11   < > _1 CREATININE 0.54   < > 0.43* 0.34*   < > 0.32* <0.30* <0.30*  CALCIUM 7.8*   < > 7.3* 7.5*   < > 8.2* 8.3* 8.1*  GFRNONAA >60   < > >60 >60   < > >60 NOT CALCULATED NOT CALCULATED  GFRAA >60   < > >60 >60   < > >60 NOT CALCULATED NOT CALCULATED  PROT 5.4*   < > 5.3* 5.3*  --   --   --  5.5*  ALBUMIN 1.7*   < > 1.6* 1.4*  --   --   --  1.5*  AST 23   < > 13* 24  --   --   --  11*  ALT 14   < > 14 25  --   --   --  15  ALKPHOS 372*   < > 183* 353*  --   --   --  211*  BILITOT 2.0*   < > 2.2* 1.8*  --   --   --  1.7*  BILIDIR 1.0*  --   --   --   --   --   --   --   IBILI 1.0*  --   --   --   --   --   --   --    < > = values in this interval not displayed.    Studies:  Ct Head Wo Contrast  Result Date: 09/11/2018 CLINICAL DATA:  Altered level of consciousness. Metastatic breast cancer. EXAM: CT HEAD WITHOUT CONTRAST TECHNIQUE: Contiguous axial images were obtained from the base of the skull through the vertex without intravenous contrast. COMPARISON:  CT head 04/08/2018, MRI head 07/17/2018 FINDINGS:  Brain: Previously noted edema in the right occipital lobe has resolved. No new area of edema or hemorrhage. Mild atrophy appears to have progressed. Mild patchy  white matter hypodensity also has progressed. Question brain radiation. Vascular: Negative for hyperdense vessel Skull: Negative Sinuses/Orbits: Prior sinus surgery. Mucoperiosteal thickening in the maxillary sinus bilaterally. Negative orbit. Other: None IMPRESSION: 1. Interval resolution of edema in the right occipital lobe related to metastatic disease on prior studies. Current study was done without intravenous contrast and is not adequate to evaluate for residual or recurrent metastatic disease. 2. Progression of atrophy and white matter changes since the prior study. Question related to brain radiation. Electronically Signed   By: Franchot Gallo M.D.   On: 09/11/2018 12:04   Dg Chest Portable 1 View  Result Date: 09/22/2018 CLINICAL DATA:  58 year old female with weakness. History of metastatic breast cancer. EXAM: PORTABLE CHEST 1 VIEW COMPARISON:  Chest radiograph dated 06/22/2018 and CT dated 06/11/2018 FINDINGS: There is shallow inspiration with bibasilar atelectasis. A 4.4 cm rounded opacity in the right upper lobe corresponds to density seen on the prior CT and may represent metastatic disease or primary lung cancer. Additional smaller 14 mm nodular density noted in the right upper lobe more laterally. There is probable small bilateral pleural effusions. No pneumothorax. There is mild cardiomegaly. Atherosclerotic calcification of aortic arch. Right sided Port-A-Cath with tip in the region of the cavoatrial junction. No acute osseous pathology. IMPRESSION: 1. Small bilateral pleural effusions and bibasilar atelectasis or infiltrate. 2. Right upper lobe rounded opacity corresponding to the density seen on the prior CT. An additional 14 mm right upper lobe pulmonary nodule noted. Electronically Signed   By: Anner Crete M.D.   On: 09/22/2018  19:07   Vas [place] Upper Extremity Venous Duplex  Result Date: 09/11/2018 UPPER VENOUS STUDY  Indications: Pain, Swelling, and Severe bruising Limitations: Unable to position the patient for optimal imaging. Comparison Study: No previous study available for comparison Performing Technologist: Toma Copier RVS  Examination Guidelines: A complete evaluation includes B-mode imaging, spectral Doppler, color Doppler, and power Doppler as needed of all accessible portions of each vessel. Bilateral testing is considered an integral part of a complete examination. Limited examinations for reoccurring indications may be performed as noted.  Right Findings: +----------+------------+---------+-----------+----------+--------------+ RIGHT     CompressiblePhasicitySpontaneousProperties   Summary     +----------+------------+---------+-----------+----------+--------------+ IJV                                                 Not visualized +----------+------------+---------+-----------+----------+--------------+ Subclavian                                          Not visualized +----------+------------+---------+-----------+----------+--------------+ Unable to Visulize the IJV or subclavian due to position of the patient and neck  Left Findings: +----------+------------+---------+-----------+----------+--------------------+ LEFT      CompressiblePhasicitySpontaneousProperties      Summary        +----------+------------+---------+-----------+----------+--------------------+ IJV           Full       Yes       Yes                  Difficult to  visualize       +----------+------------+---------+-----------+----------+--------------------+ Subclavian               Yes       Yes                  Difficult to                                                              visualize        +----------+------------+---------+-----------+----------+--------------------+ Axillary      Full                                                       +----------+------------+---------+-----------+----------+--------------------+ Brachial      Full       Yes       Yes                                   +----------+------------+---------+-----------+----------+--------------------+ Radial        Full                                   Difficult to image  +----------+------------+---------+-----------+----------+--------------------+ Ulnar         Full                                   Difficult to image  +----------+------------+---------+-----------+----------+--------------------+ Cephalic      Full                                                       +----------+------------+---------+-----------+----------+--------------------+ Basilic       Full                                                       +----------+------------+---------+-----------+----------+--------------------+  Summary:  Right: Unable to visualize the IJV and subclavian due to position of the patient and neck.  Left: This was a limited study. There is no obvious evidence of DVT. However limited visualization due to sub optimal postioning of the arm due to pain.  *See table(s) above for measurements and observations.  Diagnosing physician: Curt Jews MD Electronically signed by Curt Jews MD on 09/11/2018 at 7:12:31 AM.    Final

## 2018-09-15 NOTE — Progress Notes (Signed)
Daily Progress Note   Patient Name: Stacy Shelton       Date: 09/15/2018 DOB: Oct 24, 1960  Age: 58 y.o. MRN#: [id number] Attending Physician: Mendel Corning, MD Primary Care Physician: Robyne Peers, MD Admit Date: 08/30/2018  Reason for Consultation/Follow-up: Establishing goals of care and Psychosocial/spiritual support   Addendum:  After our visit patient's family spent time with her alone in the room.  After about 30 minutes family met me in the hallway and requested a shift to comfort and a bed at [facility].   Subjective: Met with mother, sister, and best friend at bedside.  Dr. Alvy Bimler joined [place].  She asked Samaa if she feels she is getting better, worse or about the same as a result of this hospitalization.  Adi abruptly responded "I want to go to Hospice.  Just let me go to Hospice".  As this was such a direct response we tried to clarify why she was requesting that and to assess whether or not she knew what she was asking.   Dr. Alvy Bimler asked Aerin to identify the woman standing at bedside.  Lonetta correctly identified her mother.  She was asked to identify Clarise Cruz.  Mellany stated that was her mother as well.  After determining Birttany was confused we adjourned to the conference room.  For an hour Dr. Alvy Bimler, the family and I held a conversation discussing the progression of the metastatic breast cancer, and goals of care for Fiora.  As Whitleigh is not able to make her own decisions her mother, Lucita Ferrara, made the decision for her to be a DNR/DNI.    Dr. Alvy Bimler answered numerous questions regarding blood counts, nutrition, infection, and prognosis.  As a group we talked about shifting to comfort in the hospital, hospice services at hospice house.  The family understands that once we stop  transfusing her with platelets and blood she will likely pass away in days.  Family described patient as very kind and intellectual.  She is a Insurance account manager.   She is an avid reader. She  told her family previously that if she is unable to read or to comprehend being read too - that she would not want to go on.    The amount of information imparted and the decisions that still need to be made were overwhelming to  the family.    When we returned to the room Liannah had developed acute delirium.  "Get me out of this bag"  I re-met with the sister [name] and re-discussed medications, platelet and blood transfusions, next steps and options.  Several times we had to circle to conversation back around to the patient's request to go to hospice house, her metastatic cancer has progressed and there is nothing we can do to treat it at this point.    At the end of approximately 3 hours I asked to speak with Lucita Ferrara - the patient's mother and decision maker.  Lucita Ferrara felt that further transfusions were not consistent with providing Leana comfort.  Thus the decision was finalized for comfort with no further transfusions.  Visiting restrictions were discussed with RN.  I conveyed to the family that 4 people are allowed at bedside during visiting hours and because Mirah has been suffering with delirium 1 person is allowed to stay with her at all times.  Assessment: Patient bed bound with painful wounds, not eating, not moving, requesting hospice house but acutely delirious.  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   Patient Profile/HPI:  58 y.o. female  with past medical history of type two diabetes, asthma, and a diagnosis of metastatic breast cancer (brain, bone, liver) as of February 2020, who was admitted on 09/14/2018 with septic shock from UTI and a CBG of 19.   Since admission she has received multiple units of platelets yet remains dangerously thrombocytopenic.  Antibiotics for UTI were completed  Sunday 8/16. She has received 4 platelet transfusions and two units of PRBCs.     Length of Stay: 8  Current Medications: Scheduled Meds:  . sodium chloride   Intravenous Once  . amiodarone  200 mg Oral Daily  . budesonide (PULMICORT) nebulizer solution  0.25 mg Nebulization BID  . Chlorhexidine Gluconate Cloth  6 each Topical Daily  . feeding supplement (GLUCERNA SHAKE)  237 mL Oral Q4H  . feeding supplement (PRO-STAT SUGAR FREE 64)  30 mL Oral TID  . insulin aspart  0-5 Units Subcutaneous QHS  . insulin aspart  0-9 Units Subcutaneous TID WC  . insulin detemir  13 Units Subcutaneous QHS  . LORazepam  0.5 mg Intravenous Once  . mouth rinse  15 mL Mouth Rinse BID  . montelukast  10 mg Oral QHS  . multivitamin with minerals  1 tablet Oral Daily  . nutrition supplement (JUVEN)  1 packet Oral BID BM  . pantoprazole  40 mg Oral Daily  . sodium chloride flush  10-40 mL Intracatheter Q12H    Continuous Infusions:   PRN Meds: acetaminophen, albuterol, lidocaine-prilocaine, morphine, oxyCODONE-acetaminophen, sodium chloride flush  Physical Exam        Well developed female, awake, pale, confused, agitated. Lying in bed yelling out.  Vital Signs: BP (!) 134/59   Pulse (!) 121   Temp 99 F (37.2 C) (Oral)   Resp (!) 25   Ht '5\' 2"'$  (1.575 m)   Wt 59.2 kg   LMP 10/30/2010   SpO2 92%   BMI 23.87 kg/m  SpO2: SpO2: 92 % O2 Device: O2 Device: Room Air O2 Flow Rate: O2 Flow Rate (L/min): 2 L/min  Intake/output summary:   Intake/Output Summary (Last 24 hours) at 09/15/2018 1311 Last data filed at 09/15/2018 1200 Gross per 24 hour  Intake 1048.99 ml  Output 1150 ml  Net -101.01 ml   LBM: Last BM Date: 09/14/18 Baseline Weight: Weight: 57.4 kg Most recent  weight: Weight: 59.2 kg       Palliative Assessment/Data: 20%      Patient Active Problem List   Diagnosis Date Noted  . Metastatic cancer to brain ([place])   . Palliative care encounter   . Acute encephalopathy  09/25/2018  . Decubitus ulcers 09/01/2018  . Nausea and vomiting 08/13/2018  . Olecranon bursitis, left elbow 07/28/2018  . Tachycardia   . Acute diastolic heart failure ([place])   . Pressure injury of skin 06/13/2018  . Severe anemia 06/11/2018  . Other constipation 05/20/2018  . Metastasis to lung ([place]) 05/20/2018  . Cancer associated pain 05/15/2018  . Physical debility 05/13/2018  . S/P thoracentesis   . SVT (supraventricular tachycardia) ([place])   . Increased oxygen demand   . Tachypnea   . Hypoxia   . Fever 04/24/2018  . Chronic diastolic CHF (congestive heart failure) ([place]) 04/24/2018  . Anemia associated with chemotherapy 04/24/2018  . Chemotherapy-induced thrombocytopenia 04/24/2018  . Acute cystitis without hematuria   . Pneumonia due to infectious organism   . Malnutrition of moderate degree 04/09/2018  . Septic shock ([place])   . Sepsis ([place]) 04/07/2018  . Hypokalemia 04/07/2018  . Hypomagnesemia 04/07/2018  . Acute lower UTI 04/07/2018  . Hypophosphatemia 04/07/2018  . Metastatic breast cancer ([place]) 03/17/2018  . Metastasis to brain ([place]) 03/17/2018  . Metastasis to liver ([place]) 03/17/2018  . Metastasis to bone ([place]) 03/17/2018  . Pancytopenia, acquired ([place]) 03/17/2018  . Goals of care, counseling/discussion 03/17/2018  . Encounter for antineoplastic chemotherapy 03/17/2018  . Thrombocytopenia ([place]) 03/11/2018  . Uncontrolled diabetes mellitus with hyperglycemia ([place]) 03/11/2018  . Moderate persistent asthma 03/11/2018    Palliative Care Plan    Recommendations/Plan: Family has requested comfort measures and a bed at [facility] place.  Discussed with social work. Orders adjusted for comfort. Please lift visiting restrictions - patient is acutely delirious and rapidly nearing end of life.  Goals of Care and Additional Recommendations:  Limitations on Scope of Treatment: Full Comfort Care and No Blood Transfusions  Code Status:  DNR  Prognosis:   Hours - Days.   Patient is transfusion dependent.  Per Oncology with no further transfusions she has 1 week or less.   Discharge Planning:  Hospice facility vs hospital death.  Care plan was discussed with family, RN, MD, Oncology, Social work.  Thank you for allowing the Palliative Medicine Team to assist in the care of this patient.  Total time spent:  3 hours Time in:  12:00 pm Time out:  3:00 pm     Greater than 50%  of this time was spent counseling and coordinating care related to the above assessment and plan.  Florentina Jenny, PA-C Palliative Medicine  Please contact Palliative MedicineTeam phone at [phone] for questions and concerns between 7 am - 7 pm.   Please see AMION for individual provider pager numbers.

## 2018-09-16 LAB — CBC WITH DIFFERENTIAL/PLATELET
Abs Immature Granulocytes: 0.18 10*3/uL — ABNORMAL HIGH (ref 0.00–0.07)
Basophils Absolute: 0.1 10*3/uL (ref 0.0–0.1)
Basophils Relative: 6 %
Eosinophils Absolute: 0 10*3/uL (ref 0.0–0.5)
Eosinophils Relative: 0 %
HCT: 24.4 % — ABNORMAL LOW (ref 36.0–46.0)
Hemoglobin: 7.9 g/dL — ABNORMAL LOW (ref 12.0–15.0)
Immature Granulocytes: 10 %
Lymphocytes Relative: 38 %
Lymphs Abs: 0.7 10*3/uL (ref 0.7–4.0)
MCH: 29.3 pg (ref 26.0–34.0)
MCHC: 32.4 g/dL (ref 30.0–36.0)
MCV: 90.4 fL (ref 80.0–100.0)
Monocytes Absolute: 0.2 10*3/uL (ref 0.1–1.0)
Monocytes Relative: 12 %
Neutro Abs: 0.6 10*3/uL — ABNORMAL LOW (ref 1.7–7.7)
Neutrophils Relative %: 34 %
Platelets: 8 10*3/uL — CL (ref 150–400)
RBC: 2.7 MIL/uL — ABNORMAL LOW (ref 3.87–5.11)
RDW: 17 % — ABNORMAL HIGH (ref 11.5–15.5)
WBC: 1.8 10*3/uL — ABNORMAL LOW (ref 4.0–10.5)
nRBC: 0 % (ref 0.0–0.2)

## 2018-09-16 LAB — PREPARE PLATELET PHERESIS: Unit division: 0

## 2018-09-16 LAB — BPAM PLATELET PHERESIS
Blood Product Expiration Date: 202008192359
ISSUE DATE / TIME: 202008171252
Unit Type and Rh: 8400

## 2018-09-16 LAB — GLUCOSE, CAPILLARY: Glucose-Capillary: 239 mg/dL — ABNORMAL HIGH (ref 70–99)

## 2018-09-16 MED ORDER — PRO-STAT SUGAR FREE PO LIQD
30.0000 mL | Freq: Three times a day (TID) | ORAL | Status: DC
  Administered 2018-09-16 – 2018-09-20 (×5): 30 mL via ORAL
  Filled 2018-09-16 (×5): qty 30

## 2018-09-16 MED ORDER — INSULIN ASPART 100 UNIT/ML ~~LOC~~ SOLN
0.0000 [IU] | Freq: Every day | SUBCUTANEOUS | Status: DC
  Administered 2018-09-16: 2 [IU] via SUBCUTANEOUS

## 2018-09-16 MED ORDER — SODIUM CHLORIDE 0.9% IV SOLUTION
Freq: Once | INTRAVENOUS | Status: AC
  Administered 2018-09-16: 22:00:00 via INTRAVENOUS

## 2018-09-16 MED ORDER — INSULIN ASPART 100 UNIT/ML ~~LOC~~ SOLN: 0.0000 [IU] | Freq: Three times a day (TID) | SUBCUTANEOUS | Status: DC

## 2018-09-16 MED ORDER — INSULIN ASPART 100 UNIT/ML ~~LOC~~ SOLN
0.0000 [IU] | Freq: Three times a day (TID) | SUBCUTANEOUS | Status: DC
  Administered 2018-09-17 – 2018-09-18 (×4): 2 [IU] via SUBCUTANEOUS
  Administered 2018-09-18: 3 [IU] via SUBCUTANEOUS
  Administered 2018-09-19 (×2): 2 [IU] via SUBCUTANEOUS

## 2018-09-16 MED ORDER — INSULIN DETEMIR 100 UNIT/ML ~~LOC~~ SOLN
7.0000 [IU] | Freq: Every day | SUBCUTANEOUS | Status: DC
  Filled 2018-09-16: qty 0.07

## 2018-09-16 NOTE — Progress Notes (Addendum)
[facility]                                                                              Patient Demographics  Stacy Shelton, is a 58 y.o. female, DOB - [birth year], JOI:[id number]  Admit date - 09/20/2018   Admitting Physician Bonnell Public, MD  Outpatient Primary MD for the patient is Robyne Peers, MD  Outpatient specialists:   LOS - 9  days   Medical records reviewed and are as summarized below:    No chief complaint on file.      Brief summary   Patient is a 58 year old Caucasian female with past medical history of metastatic breast cancer, diabetes mellitus type 2, asthma was brought to hospital after altered mental status.  Found to be hypoglycemic with blood glucose 19.  She was started on broad-spectrum antibiotic for infectious etiology.  Also patient found to be severely anemic and thrombocytopenic.  Transfused  PRBC and platelets. Patient was hypotensive and required brief Levophed infusion on 8/10, off on 8/11  Assessment & Plan    Principal Problem:   Sepsis ([place]) secondary to UTI with septic shock -Patient met sepsis criteria at the time of admission with low-grade fever, hypotension, tachycardia, tachypnea, hypoxia, source likely due to UTI. -Briefly required vasopressors, Levophed was weaned off on 8/11 -Urine culture showed enterococcus, patient has completed antibiotics (initially vancomycin, then IV ampicillin according to the sensitivities) on 8/16, total 7 days.    Active Problems: Acute enterococcus UTI -Urine culture showed more than 100,000 colonies of Enterococcus faecalis, initially started on broad-spectrum antibiotics on IV vancomycin and cefepime -Patient has completed IV antibiotics on 8/16   Pancytopenia with thrombocytopenia ([place]) -Appears to be refractory to transfusions, likely due to bone marrow involvement, chemo.  Initially thought to have worsened due to antibiotics but vancomycin and cefepime were discontinued with  no significant improvement.  - patient had presented with hemoglobin of 6.6, had received 3 units packed RBCs, also had received 1 unit of platelets, received 1 unit platelets pheresis on 8/12, 8/13, 8/14, 8/17.  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    Metastatic breast cancer ([place]) -Dr. Alvy Bimler following, continue supportive care for now, not ready to resume chemotherapy -Goals of care meeting again today -Patient had goals of care meeting on 8/17 in the presence of her family, at that time patient was somewhat confused, family requested DNR, comfort care and possibly residential hospice -This morning however patient is much more alert and awake, oriented, wants full scope of treatment.  Goals of care meeting again today with oncology and palliative medicine.  Hypoalbuminemia, moderate degree malnutrition, severe deconditioning -Albumin 1.4, added pro-stat, oral intake remains poor    Acute encephalopathy -Intermittent waxing and waning mental status, possibly due to acute UTI, critical illness, severe debility, poor oral intake -CT head 8/13 negative for any bleed or stroke -Patient's mental status has been waxing and waning, goals of care meeting  again today  History of mild diastolic dysfunction -2D echo 5/20 had shown EF of 60 to 65%, left ventricular diastolic function could not be ascertained - continue to monitor I's and O's  Multiple decub ulcers -Per nursing documentation Bilateral Ear pressure injuries, Stage 1 (POA); Upper back pressure injury Stage 2, (POA); Sacral pressure injury, unstageable (POA); Left Heel pressure injury, unstageable (POA); Right thigh pressure injury, unstagable (POA) -Continue wound  care.  Deconditioning, debility -Patient has not done PT in the last 3 weeks, very deconditioned with myopathy of critical illness, poor oral intake.   Code Status: DNR DVT Prophylaxis:  SCD's Family Communication: Goals of care meeting again today.  Patient had [place] with the family, oncology, palliative medicine on 8/17, family had requested DNR, comfort care and possibly residential hospice.  This morning however patient is alert and oriented, wants full scope of treatment.  [place] meeting again today  Addendum 6:20 PM Discussed with Dr. Alvy Bimler, patient and family has requested full code and full scope of treatment.  Dr. Alvy Bimler rescinded all palliative care/comfort care orders placed by palliative medicine on 6/17 after discussion with the patient and family.  As requested by the family, patient placed back on telemetry, stat CBC, sliding scale insulin orders placed back.  Overall poor prognosis with metastatic breast CA, transfusion refractory acquired pancytopenia, thrombocytopenia, FTT/poor oral intake.  Dr. Rhea Pink is also following.   Disposition Plan:   Time Spent in minutes 25  Procedures:  None  Consultants:   Oncology, Dr. Alvy Bimler Palliative medicine  Antimicrobials:   Anti-infectives (From admission, onward)   Start     Dose/Rate Route Frequency Ordered Stop   09/10/18 1200  ampicillin (OMNIPEN) 1 g in sodium chloride 0.9 % 100 mL IVPB     1 g 300 mL/hr over 20 Minutes Intravenous Every 6 hours 09/10/18 0822 09/14/18 1909   09/09/18 0300  vancomycin (VANCOCIN) IVPB 1000 mg/200 mL premix  Status:  Discontinued     1,000 mg 200 mL/hr over 60 Minutes Intravenous Daily 09/08/18 0323 09/10/18 0752   09/08/18 0600  ceFEPIme (MAXIPIME) 2 g in sodium chloride 0.9 % 100 mL IVPB  Status:  Discontinued     2 g 200 mL/hr over 30 Minutes Intravenous Every 8 hours 09/08/18 0323 09/10/18 0752   09/09/2018 1900  ceFEPIme (MAXIPIME) 2 g in sodium chloride 0.9 % 100 mL IVPB     2  g 200 mL/hr over 30 Minutes Intravenous  Once 09/06/2018 1858 09/01/2018 2120   09/16/2018 1900  metroNIDAZOLE (FLAGYL) IVPB 500 mg     500 mg 100 mL/hr over 60 Minutes Intravenous  Once 08/31/2018 1858 09/12/2018 2228   09/03/2018 1900  vancomycin (VANCOCIN) IVPB 1000 mg/200 mL premix     1,000 mg 200 mL/hr over 60 Minutes Intravenous  Once 09/20/2018 1858 09/08/18 0221         Medications  Scheduled Meds:  sodium chloride   Intravenous Once   amiodarone  200 mg Oral Daily   Chlorhexidine Gluconate Cloth  6 each Topical Daily   feeding supplement (GLUCERNA SHAKE)  237 mL Oral Q4H   mouth rinse  15 mL Mouth Rinse BID   montelukast  10 mg Oral QHS   pantoprazole  40 mg Oral Daily   sodium chloride flush  10-40 mL Intracatheter Q12H   Continuous Infusions:  PRN Meds:.acetaminophen, albuterol, antiseptic oral rinse, glycopyrrolate **OR** glycopyrrolate **OR** glycopyrrolate, haloperidol **OR** haloperidol **OR** haloperidol lactate, HYDROmorphone (DILAUDID) injection, lidocaine-prilocaine, [DISCONTINUED] LORazepam **OR** [  DISCONTINUED] LORazepam **OR** LORazepam, morphine, polyvinyl alcohol      Subjective:   Stacy Shelton was seen and examined today.  Much more alert and awake, oriented today.  Denies any specific complaints, pain in the lower extremity and feet.  Still very debilitated  Objective:   Vitals:   09/15/18 1700 09/15/18 2117 09/16/18 0554 09/16/18 0600  BP:  (!) 127/53 (!) 116/46   Pulse: 82 (!) 110 (!) 114 (!) 111  Resp: (!) 23 16 16 16   Temp: (!) 97.4 F (36.3 C) 97.7 F (36.5 C) 98.6 F (37 C)   TempSrc: Axillary Axillary Oral   SpO2: 100% 100% 93% 98%  Weight:      Height:        Intake/Output Summary (Last 24 hours) at 09/16/2018 1256 Last data filed at 09/16/2018 0600 Gross per 24 hour  Intake 848.5 ml  Output 1400 ml  Net -551.5 ml     Wt Readings from Last 3 Encounters:  09/14/18 59.2 kg  08/06/18 55.8 kg  07/28/18 55.8 kg    Physical  Exam  General: Much more alert and oriented today, NAD   Eyes:   HEENT:  Atraumatic, normocephalic  Cardiovascular: S1 S2 clear, RRR.  1+ pedal edema b/l  Respiratory: Clear anteriorly  Gastrointestinal: Soft, nontender, nondistended, NBS  Ext: 1+ pedal edema bilaterally  Neuro: States pain in the lower extremity and feet and does not want to comply with neuro exam  Musculoskeletal: No cyanosis, clubbing  Skin: Multiple decub with dressing  Psych: Alert and oriented   Data Reviewed:  I have personally reviewed following labs and imaging studies  Micro Results Recent Results (from the past 240 hour(s))  Culture, blood (routine x 2)     Status: None   Collection Time: 09/06/2018  6:06 PM   Specimen: BLOOD  Result Value Ref Range Status   Specimen Description   Final    BLOOD PICC LINE Performed at [facility] [address] [place], [place] [address]    Special Requests   Final    BOTTLES DRAWN AEROBIC AND ANAEROBIC Blood Culture adequate volume Performed at [facility] [address] [place], [place] [address]    Culture   Final    NO GROWTH 5 DAYS Performed at [facility], [place] [address], [place], [place] [address]    Report Status 09/12/2018 FINAL  Final  SARS Coronavirus 2 [facility] order, Performed in [facility] hospital lab) Nasopharyngeal Nasopharyngeal Swab     Status: None   Collection Time: 09/05/2018  6:11 PM   Specimen: Nasopharyngeal Swab  Result Value Ref Range Status   SARS Coronavirus 2 NEGATIVE NEGATIVE Final    Comment: (NOTE) If result is NEGATIVE SARS-CoV-2 target nucleic acids are NOT DETECTED. The SARS-CoV-2 RNA is generally detectable in upper and lower  respiratory specimens during the acute phase of infection. The lowest  concentration of SARS-CoV-2 viral copies this assay can detect is 250  copies / mL. A negative result does not preclude SARS-CoV-2 infection  and should not be used as  the sole basis for treatment or other  patient management decisions.  A negative result may occur with  improper specimen collection / handling, submission of specimen other  than nasopharyngeal swab, presence of viral mutation(s) within the  areas targeted by this assay, and inadequate number of viral copies  (<250 copies / mL). A negative result must be combined with clinical  observations, patient history, and  epidemiological information. If result is POSITIVE SARS-CoV-2 target nucleic acids are DETECTED. The SARS-CoV-2 RNA is generally detectable in upper and lower  respiratory specimens dur ing the acute phase of infection.  Positive  results are indicative of active infection with SARS-CoV-2.  Clinical  correlation with patient history and other diagnostic information is  necessary to determine patient infection status.  Positive results do  not rule out bacterial infection or co-infection with other viruses. If result is PRESUMPTIVE POSTIVE SARS-CoV-2 nucleic acids MAY BE PRESENT.   A presumptive positive result was obtained on the submitted specimen  and confirmed on repeat testing.  While 2019 novel coronavirus  (SARS-CoV-2) nucleic acids may be present in the submitted sample  additional confirmatory testing may be necessary for epidemiological  and / or clinical management purposes  to differentiate between  SARS-CoV-2 and other Sarbecovirus currently known to infect humans.  If clinically indicated additional testing with an alternate test  methodology [phone]) is advised. The SARS-CoV-2 RNA is generally  detectable in upper and lower respiratory sp ecimens during the acute  phase of infection. The expected result is Negative. Fact Sheet for Patients:  StrictlyIdeas.no Fact Sheet for Healthcare Providers: BankingDealers.co.za This test is not yet approved or cleared by the [place] FDA and has been authorized for  detection and/or diagnosis of SARS-CoV-2 by FDA under an Emergency Use Authorization (EUA).  This EUA will remain in effect (meaning this test can be used) for the duration of the COVID-19 declaration under Section 564(b)(1) of the Act, 21 U.S.C. section 360bbb-3(b)(1), unless the authorization is terminated or revoked sooner. Performed at [facility], [place] [address] [place], [place] [address]   MRSA PCR Screening     Status: None   Collection Time: 09/14/2018 11:25 PM   Specimen: Nasal Mucosa; Nasopharyngeal  Result Value Ref Range Status   MRSA by PCR NEGATIVE NEGATIVE Final    Comment:        The GeneXpert MRSA Assay (FDA approved for NASAL specimens only), is one component of a comprehensive MRSA colonization surveillance program. It is not intended to diagnose MRSA infection nor to guide or monitor treatment for MRSA infections. Performed at [facility], [place] [address], [place], [place] [address]   Culture, blood (routine x 2)     Status: None   Collection Time: 09/08/18 12:31 AM   Specimen: BLOOD LEFT HAND  Result Value Ref Range Status   Specimen Description   Final    BLOOD LEFT HAND Performed at [facility], [place] [address] [place], [place] [address]    Special Requests   Final    BOTTLES DRAWN AEROBIC ONLY Blood Culture results may not be optimal due to an inadequate volume of blood received in culture bottles Performed at [facility] [address], [place], [place] [address]    Culture   Final    NO GROWTH 5 DAYS Performed at [facility], [place] [address] [place], [place] [address]    Report Status 09/13/2018 FINAL  Final  Urine culture     Status: Abnormal   Collection Time: 09/08/18  2:55 AM   Specimen: Urine, Random  Result Value Ref Range Status   Specimen Description   Final    URINE, RANDOM Performed at [facility] [address] [place],   [address]    Special Requests   Final    NONE Performed at [facility], [address] [place], [place] [address]    Culture >=100,000 COLONIES/mL ENTEROCOCCUS FAECALIS (A)  Final   Report Status 09/10/2018 FINAL  Final   Organism ID, Bacteria ENTEROCOCCUS FAECALIS (A)  Final      Susceptibility   Enterococcus faecalis - MIC*    AMPICILLIN <=2 SENSITIVE Sensitive     LEVOFLOXACIN 2 SENSITIVE Sensitive     NITROFURANTOIN <=16 SENSITIVE Sensitive     VANCOMYCIN 1 SENSITIVE Sensitive     * >=100,000 COLONIES/mL ENTEROCOCCUS FAECALIS    Radiology Reports Ct Head Wo Contrast  Result Date: 09/11/2018 CLINICAL DATA:  Altered level of consciousness. Metastatic breast cancer. EXAM: CT HEAD WITHOUT CONTRAST TECHNIQUE: Contiguous axial images were obtained from the base of the skull through the vertex without intravenous contrast. COMPARISON:  CT head 04/08/2018, MRI head 07/17/2018 FINDINGS: Brain: Previously noted edema in the right occipital lobe has resolved. No new area of edema or hemorrhage. Mild atrophy appears to have progressed. Mild patchy white matter hypodensity also has progressed. Question brain radiation. Vascular: Negative for hyperdense vessel Skull: Negative Sinuses/Orbits: Prior sinus surgery. Mucoperiosteal thickening in the maxillary sinus bilaterally. Negative orbit. Other: None IMPRESSION: 1. Interval resolution of edema in the right occipital lobe related to metastatic disease on prior studies. Current study was done without intravenous contrast and is not adequate to evaluate for residual or recurrent metastatic disease. 2. Progression of atrophy and white matter changes since the prior study. Question related to brain radiation. Electronically Signed   By: Franchot Gallo M.D.   On: 09/11/2018 12:04   Dg Chest Portable 1 View  Result Date: 09/13/2018 CLINICAL DATA:  58 year old female with weakness. History of metastatic breast cancer. EXAM: PORTABLE CHEST 1  VIEW COMPARISON:  Chest radiograph dated 06/22/2018 and CT dated 06/11/2018 FINDINGS: There is shallow inspiration with bibasilar atelectasis. A 4.4 cm rounded opacity in the right upper lobe corresponds to density seen on the prior CT and may represent metastatic disease or primary lung cancer. Additional smaller 14 mm nodular density noted in the right upper lobe more laterally. There is probable small bilateral pleural effusions. No pneumothorax. There is mild cardiomegaly. Atherosclerotic calcification of aortic arch. Right sided Port-A-Cath with tip in the region of the cavoatrial junction. No acute osseous pathology. IMPRESSION: 1. Small bilateral pleural effusions and bibasilar atelectasis or infiltrate. 2. Right upper lobe rounded opacity corresponding to the density seen on the prior CT. An additional 14 mm right upper lobe pulmonary nodule noted. Electronically Signed   By: Anner Crete M.D.   On: 09/03/2018 19:07   Vas [place] Upper Extremity Venous Duplex  Result Date: 09/11/2018 UPPER VENOUS STUDY  Indications: Pain, Swelling, and Severe bruising Limitations: Unable to position the patient for optimal imaging. Comparison Study: No previous study available for comparison Performing Technologist: Toma Copier RVS  Examination Guidelines: A complete evaluation includes B-mode imaging, spectral Doppler, color Doppler, and power Doppler as needed of all accessible portions of each vessel. Bilateral testing is considered an integral part of a complete examination. Limited examinations for reoccurring indications may be performed as noted.  Right Findings: +----------+------------+---------+-----------+----------+--------------+  RIGHT      Compressible Phasicity Spontaneous Properties    Summary      +----------+------------+---------+-----------+----------+--------------+  IJV  Not visualized   +----------+------------+---------+-----------+----------+--------------+  Subclavian                                               Not visualized  +----------+------------+---------+-----------+----------+--------------+ Unable to Visulize the IJV or subclavian due to position of the patient and neck  Left Findings: +----------+------------+---------+-----------+----------+--------------------+  LEFT       Compressible Phasicity Spontaneous Properties       Summary         +----------+------------+---------+-----------+----------+--------------------+  IJV            Full        Yes        Yes                    Difficult to                                                                     visualize        +----------+------------+---------+-----------+----------+--------------------+  Subclavian                 Yes        Yes                    Difficult to                                                                     visualize        +----------+------------+---------+-----------+----------+--------------------+  Axillary       Full                                                            +----------+------------+---------+-----------+----------+--------------------+  Brachial       Full        Yes        Yes                                      +----------+------------+---------+-----------+----------+--------------------+  Radial         Full                                       Difficult to image   +----------+------------+---------+-----------+----------+--------------------+  Ulnar          Full                                       Difficult to image   +----------+------------+---------+-----------+----------+--------------------+  Cephalic       Full                                                            +----------+------------+---------+-----------+----------+--------------------+  Basilic        Full                                                             +----------+------------+---------+-----------+----------+--------------------+  Summary:  Right: Unable to visualize the IJV and subclavian due to position of the patient and neck.  Left: This was a limited study. There is no obvious evidence of DVT. However limited visualization due to sub optimal postioning of the arm due to pain.  *See table(s) above for measurements and observations.  Diagnosing physician: Curt Jews MD Electronically signed by Curt Jews MD on 09/11/2018 at 7:12:31 AM.    Final     Lab Data:  CBC: Recent Labs  Lab 09/10/18 1522  09/11/18 1735 09/12/18 0300 09/12/18 1606 09/12/18 1910 09/13/18 0500 09/14/18 0520 09/15/18 0500  WBC 3.1*   < > 2.9* 2.6*  --  2.3* 2.2* 2.5* 2.0*  NEUTROABS 1.6*  --  1.5*  --   --  1.1*  --   --   --   HGB 9.0*   < > 9.5* 9.2* 8.9* 8.6* 8.2* 8.5* 7.7*  HCT 27.0*   < > 29.0* 28.4* 27.3* 26.9* 25.7* 26.3* 24.2*  MCV 86.8   < > 89.2 89.9  --  90.6 91.5 92.3 93.1  PLT 10*   < > 9* 7*  --  45* 30* 16* 7*   < > = values in this interval not displayed.   Basic Metabolic Panel: Recent Labs  Lab 09/11/18 0537 09/12/18 0300 09/13/18 0500 09/14/18 0520 09/14/18 1744 09/15/18 0500  NA 132* 133* 136 136  --  137  K 3.1* 3.8 3.1* 3.8  --  3.5  CL 100 102 101 100  --  99  CO2 25 26 28 29   --  29  GLUCOSE 99 222* 178* 102*  --  57*  BUN 12 20 16 13   --  14  CREATININE 0.37* 0.37* 0.32* <0.30*  --  <0.30*  CALCIUM 7.7* 8.0* 8.2* 8.3*  --  8.1*  MG  --   --   --   --   --  1.9  PHOS  --   --   --   --  3.6  --    GFR: CrCl cannot be calculated (This lab value cannot be used to calculate CrCl because it is not a number: <0.30). Liver Function Tests: Recent Labs  Lab 09/10/18 0511 09/15/18 0500  AST 24 11*  ALT 25 15  ALKPHOS 353* 211*  BILITOT 1.8* 1.7*  PROT 5.3* 5.5*  ALBUMIN 1.4* 1.5*   No results for input(s): LIPASE, AMYLASE in the last 168 hours. No results for input(s): AMMONIA in the last 168 hours. Coagulation  Profile: No results for input(s): INR, PROTIME in the last 168 hours. Cardiac Enzymes: No results for input(s): CKTOTAL, CKMB, CKMBINDEX, TROPONINI in the last 168  hours. BNP (last 3 results) No results for input(s): PROBNP in the last 8760 hours. HbA1C: No results for input(s): HGBA1C in the last 72 hours. CBG: Recent Labs  Lab 09/14/18 2144 09/15/18 0729 09/15/18 0802 09/15/18 0833 09/15/18 1145  GLUCAP 123* 42* 42* 169* 101*   Lipid Profile: No results for input(s): CHOL, HDL, LDLCALC, TRIG, CHOLHDL, LDLDIRECT in the last 72 hours. Thyroid Function Tests: No results for input(s): TSH, T4TOTAL, FREET4, T3FREE, THYROIDAB in the last 72 hours. Anemia Panel: Recent Labs    09/14/18 1744  VITAMINB12 2,745*  FOLATE 9.6   Urine analysis:    Component Value Date/Time   COLORURINE AMBER (A) 09/08/2018 0255   APPEARANCEUR HAZY (A) 09/08/2018 0255   LABSPEC 1.016 09/08/2018 0255   PHURINE 6.0 09/08/2018 0255   GLUCOSEU NEGATIVE 09/08/2018 0255   HGBUR NEGATIVE 09/08/2018 0255   BILIRUBINUR NEGATIVE 09/08/2018 [place] 09/08/2018 0255   PROTEINUR NEGATIVE 09/08/2018 0255   NITRITE POSITIVE (A) 09/08/2018 0255   LEUKOCYTESUR NEGATIVE 09/08/2018 0255     Stacy Shelton M.D. [facility] 09/16/2018, 12:56 PM  Pager: [phone] Between 7am to 7pm - call Pager - [phone]  After 7pm go to [email or web address] - password [email or web address]  Call night coverage person covering after 7pm

## 2018-09-16 NOTE — Progress Notes (Signed)
[facility] ([facility])  Contacted [name] to discuss transitioning to [employer] today.  [name] states her sister is alert and oriented today and they have requested to meet with Dr. Alvy Bimler at some point today to get her opinion on how to proceed now that Ms. Rauls is alert and oriented.    [name] has my contact information and advised that she will call me if after their discussion they do decide to transfer to residential hospice.  They are hopeful that other treatment options are still available.  Venia Carbon RN, BSN, [facility] Liaison (in [place]) [phone]

## 2018-09-16 NOTE — Progress Notes (Signed)
Report received from [place], Therapist, sports. Assumed care of patient. Sister very concerned that patient is not on cardiac monitor now and is a DNR. Sister sates "my sister has not made that decision and it's her decision". Dr Tana Coast paged and informed writer that Dr Alvy Bimler is coming back to speak with family and possibly Dr Hilma Favors.  Family notified. Donne Hazel, RN

## 2018-09-16 NOTE — Progress Notes (Signed)
Report to [facility]; pt transferred to 1423. Donne Hazel, RN

## 2018-09-16 NOTE — Progress Notes (Signed)
Spoke with Dr. Alvy Bimler in detail about this patient. We plan to meet family at [facility] today to further discuss goals of care. Complex interaction and negotiation needs.  Lane Hacker, DO Palliative Medicine [phone]

## 2018-09-16 NOTE — Progress Notes (Signed)
Stacy Shelton   DOB:05-15-1960   WG#:[id number]    ASSESSMENT & PLAN:  Metastatic breast cancer ([place]) She has significant complication from each cycle of treatment despite dose adjustment The patient is unlikely going to improve to the point of being strong enough to receive further treatment.  Both the patient and family members are aware about the plan for no further chemotherapy and future plan will be to continue on supportive measures only  Intermittent altered mental status Likely due to metabolic encephalopathy versus disease progression in Stacy brain Yesterday, Stacy Shelton was the dedicated healthcare power of attorney wanted the patient to be transition to comfort measures. Today, she appears alert and was informed that Stacy intermittent change in mental status could be related to cancer progression.  However, I do not recommend further imaging study as it would not change plan of care  Recurrent infection She had numerous UTI, E. Coli Completed a course of ampicillin IV  Acquired pancytopenia We had numerous discussion about the role of transfusion The patient is transfusion refractory After much discussion with the family, they would like Stacy to continue transfusion support  Insulin dependent diabetes Continue insulin sliding scale She is noted to have hypoglycemia due to poor oral intake  Decubitus ulcers She has significant pain secondary to multiple new decubitus ulcer Wound care is consulted  Malnutrition of moderate degree She has significant malnutrition We have extensive discussion about taking Remeron and nutritional supplement as tolerated I have requested nursing staff to offer Stacy nutritional supplement every 2-3 hours while awake I also recommend calorie count; documentation of calorie intake was reviewed The patient appears to be motivated to increase oral intake as tolerated I did not identify any barriers for Stacy from eating I do not recommend feeding tube  placement or total parenteral nutritional support The patient has no swallowing difficulties and denies nausea The cause of Stacy malnutrition is patient's refusal to eat  Physical debility, critical myopathy She has not participated with physical therapy for over 3 weeks PT/OT has been consulted I reinforced the importance of getting physical therapy and Occupational Therapy in order to get better 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 I suggested Psychiatry consult. Patient declined  Code Status Goals of care Discharge planning I had numerous goals of care discussion with the patient and family multiple times. Today, I spent another 1 hour discussing about goals of care The patient is intermittently lucid According to Stacy Shelton and sister, they never made decision to transition Stacy care to palliative mode I rescinded the palliative care/comfort orders and put Stacy back on diabetic diet, insulin sliding scale with blood sugar monitoring, recheck blood and transfusion as needed, revert Stacy CODE STATUS back to full code, cancel social workers to look for [facility], and to continue aggressive supportive mode  All questions were answered.    [name], MD 09/16/2018 5:00 PM  Subjective:  I have seen the patient twice and reviewed plan of care with the family twice.  This morning, she was alert and oriented.  She did not recall conversation we had yesterday.  She was apprised about decision to transition Stacy care to comfort measures. The patient engage with goals of care discussion for approximately 30 minutes but then decided to stop the conversation because she was overwhelmed with decision making.  She continues to have intermittent pain from pressure sores, alleviated by morphine I had another 45 minutes  discussion with Stacy mom and sister around 5:15 PM  Objective:  Vitals:   09/16/18 0554 09/16/18 0600  BP: (!) 116/46   Pulse: (!) 114 (!) 111  Resp: 16 16  Temp: 98.6 F (37 C)   SpO2: 93% 98%     Intake/Output Summary (Last 24 hours) at 09/16/2018 1700 Last data filed at 09/16/2018 0600 Gross per 24 hour  Intake 607.5 ml  Output 1400 ml  Net -792.5 ml    GENERAL: She is intermittently alert but falling asleep.  The patient avoided questions and after 30 minutes of family discussion, the patient wants to be left alone and would not engage in further conversation about goals of care SKIN: She has significant skin bruises   Labs:  Recent Labs    05/06/18 0430  09/09/18 0525 09/10/18 0511  09/13/18 0500 09/14/18 0520 09/15/18 0500  NA 138   < > 130* 130*   < > 136 136 137  K 3.6   < > 3.0* 3.5   < > 3.1* 3.8 3.5  CL 99   < > 98 99   < > 101 100 99  CO2 32   < > 24 24   < > 28 29 29   GLUCOSE 138*   < > 250* 211*   < > 178* 102* 57*  BUN 14   < > 12 11   < > 16 13 14   CREATININE 0.54   < > 0.43* 0.34*   < > 0.32* <0.30* <0.30*  CALCIUM 7.8*   < > 7.3* 7.5*   < > 8.2* 8.3* 8.1*  GFRNONAA >60   < > >60 >60   < > >60 NOT CALCULATED NOT CALCULATED  GFRAA >60   < > >60 >60   < > >60 NOT CALCULATED NOT CALCULATED  PROT 5.4*   < > 5.3* 5.3*  --   --   --  5.5*  ALBUMIN 1.7*   < > 1.6* 1.4*  --   --   --  1.5*  AST 23   < > 13* 24  --   --   --  11*  ALT 14   < > 14 25  --   --   --  15  ALKPHOS 372*   < > 183* 353*  --   --   --  211*  BILITOT 2.0*   < > 2.2* 1.8*  --   --   --  1.7*  BILIDIR 1.0*  --   --   --   --   --   --   --   IBILI 1.0*  --   --   --   --   --   --   --    < > = values in this interval not displayed.    Studies:  Ct Head Wo Contrast  Result Date: 09/11/2018 CLINICAL DATA:  Altered level of consciousness. Metastatic breast cancer. EXAM: CT HEAD WITHOUT CONTRAST TECHNIQUE: Contiguous axial images were obtained from the base of the skull through the  vertex without intravenous contrast. COMPARISON:  CT head 04/08/2018, MRI head 07/17/2018 FINDINGS: Brain: Previously noted edema in the right occipital lobe has resolved. No new area of edema or hemorrhage. Mild atrophy appears to have progressed. Mild patchy white matter hypodensity also has progressed. Question brain radiation. Vascular: Negative for hyperdense vessel  Skull: Negative Sinuses/Orbits: Prior sinus surgery. Mucoperiosteal thickening in the maxillary sinus bilaterally. Negative orbit. Other: None IMPRESSION: 1. Interval resolution of edema in the right occipital lobe related to metastatic disease on prior studies. Current study was done without intravenous contrast and is not adequate to evaluate for residual or recurrent metastatic disease. 2. Progression of atrophy and white matter changes since the prior study. Question related to brain radiation. Electronically Signed   By: Franchot Gallo M.D.   On: 09/11/2018 12:04   Dg Chest Portable 1 View  Result Date: 09/20/2018 CLINICAL DATA:  58 year old female with weakness. History of metastatic breast cancer. EXAM: PORTABLE CHEST 1 VIEW COMPARISON:  Chest radiograph dated 06/22/2018 and CT dated 06/11/2018 FINDINGS: There is shallow inspiration with bibasilar atelectasis. A 4.4 cm rounded opacity in the right upper lobe corresponds to density seen on the prior CT and may represent metastatic disease or primary lung cancer. Additional smaller 14 mm nodular density noted in the right upper lobe more laterally. There is probable small bilateral pleural effusions. No pneumothorax. There is mild cardiomegaly. Atherosclerotic calcification of aortic arch. Right sided Port-A-Cath with tip in the region of the cavoatrial junction. No acute osseous pathology. IMPRESSION: 1. Small bilateral pleural effusions and bibasilar atelectasis or infiltrate. 2. Right upper lobe rounded opacity corresponding to the density seen on the prior CT. An additional 14 mm right  upper lobe pulmonary nodule noted. Electronically Signed   By: Anner Crete M.D.   On: 09/15/2018 19:07   Vas [place] Upper Extremity Venous Duplex  Result Date: 09/11/2018 UPPER VENOUS STUDY  Indications: Pain, Swelling, and Severe bruising Limitations: Unable to position the patient for optimal imaging. Comparison Study: No previous study available for comparison Performing Technologist: Toma Copier RVS  Examination Guidelines: A complete evaluation includes B-mode imaging, spectral Doppler, color Doppler, and power Doppler as needed of all accessible portions of each vessel. Bilateral testing is considered an integral part of a complete examination. Limited examinations for reoccurring indications may be performed as noted.  Right Findings: +----------+------------+---------+-----------+----------+--------------+ RIGHT     CompressiblePhasicitySpontaneousProperties   Summary     +----------+------------+---------+-----------+----------+--------------+ IJV                                                 Not visualized +----------+------------+---------+-----------+----------+--------------+ Subclavian                                          Not visualized +----------+------------+---------+-----------+----------+--------------+ Unable to Visulize the IJV or subclavian due to position of the patient and neck  Left Findings: +----------+------------+---------+-----------+----------+--------------------+ LEFT      CompressiblePhasicitySpontaneousProperties      Summary        +----------+------------+---------+-----------+----------+--------------------+ IJV           Full       Yes       Yes                  Difficult to  visualize       +----------+------------+---------+-----------+----------+--------------------+ Subclavian               Yes       Yes                  Difficult to                                                               visualize       +----------+------------+---------+-----------+----------+--------------------+ Axillary      Full                                                       +----------+------------+---------+-----------+----------+--------------------+ Brachial      Full       Yes       Yes                                   +----------+------------+---------+-----------+----------+--------------------+ Radial        Full                                   Difficult to image  +----------+------------+---------+-----------+----------+--------------------+ Ulnar         Full                                   Difficult to image  +----------+------------+---------+-----------+----------+--------------------+ Cephalic      Full                                                       +----------+------------+---------+-----------+----------+--------------------+ Basilic       Full                                                       +----------+------------+---------+-----------+----------+--------------------+  Summary:  Right: Unable to visualize the IJV and subclavian due to position of the patient and neck.  Left: This was a limited study. There is no obvious evidence of DVT. However limited visualization due to sub optimal postioning of the arm due to pain.  *See table(s) above for measurements and observations.  Diagnosing physician: Curt Jews MD Electronically signed by Curt Jews MD on 09/11/2018 at 7:12:31 AM.    Final

## 2018-09-17 DIAGNOSIS — C7931 Secondary malignant neoplasm of brain: Secondary | ICD-10-CM

## 2018-09-17 DIAGNOSIS — E1165 Type 2 diabetes mellitus with hyperglycemia: Secondary | ICD-10-CM

## 2018-09-17 LAB — CBC WITH DIFFERENTIAL/PLATELET
Abs Immature Granulocytes: 0.14 10*3/uL — ABNORMAL HIGH (ref 0.00–0.07)
Abs Immature Granulocytes: 0.11 10*3/uL — ABNORMAL HIGH (ref 0.00–0.07)
Basophils Absolute: 0.1 10*3/uL (ref 0.0–0.1)
Basophils Absolute: 0.1 10*3/uL (ref 0.0–0.1)
Basophils Relative: 6 %
Basophils Relative: 7 %
Eosinophils Absolute: 0 10*3/uL (ref 0.0–0.5)
Eosinophils Absolute: 0 10*3/uL (ref 0.0–0.5)
Eosinophils Relative: 2 %
Eosinophils Relative: 0 %
HCT: 23.3 % — ABNORMAL LOW (ref 36.0–46.0)
HCT: 30.5 % — ABNORMAL LOW (ref 36.0–46.0)
Hemoglobin: 7.5 g/dL — ABNORMAL LOW (ref 12.0–15.0)
Hemoglobin: 9.9 g/dL — ABNORMAL LOW (ref 12.0–15.0)
Immature Granulocytes: 8 %
Immature Granulocytes: 6 %
Lymphocytes Relative: 35 %
Lymphocytes Relative: 32 %
Lymphs Abs: 0.7 10*3/uL (ref 0.7–4.0)
Lymphs Abs: 0.6 10*3/uL — ABNORMAL LOW (ref 0.7–4.0)
MCH: 29.3 pg (ref 26.0–34.0)
MCH: 29.7 pg (ref 26.0–34.0)
MCHC: 32.2 g/dL (ref 30.0–36.0)
MCHC: 32.5 g/dL (ref 30.0–36.0)
MCV: 91 fL (ref 80.0–100.0)
MCV: 91.6 fL (ref 80.0–100.0)
Monocytes Absolute: 0.3 10*3/uL (ref 0.1–1.0)
Monocytes Absolute: 0.2 10*3/uL (ref 0.1–1.0)
Monocytes Relative: 14 %
Monocytes Relative: 12 %
Neutro Abs: 0.6 10*3/uL — ABNORMAL LOW (ref 1.7–7.7)
Neutro Abs: 0.8 10*3/uL — ABNORMAL LOW (ref 1.7–7.7)
Neutrophils Relative %: 35 %
Neutrophils Relative %: 43 %
Platelets: 21 10*3/uL — CL (ref 150–400)
Platelets: 13 10*3/uL — CL (ref 150–400)
RBC: 2.56 MIL/uL — ABNORMAL LOW (ref 3.87–5.11)
RBC: 3.33 MIL/uL — ABNORMAL LOW (ref 3.87–5.11)
RDW: 16.9 % — ABNORMAL HIGH (ref 11.5–15.5)
RDW: 16 % — ABNORMAL HIGH (ref 11.5–15.5)
WBC: 1.8 10*3/uL — ABNORMAL LOW (ref 4.0–10.5)
WBC: 1.8 10*3/uL — ABNORMAL LOW (ref 4.0–10.5)
nRBC: 1.1 % — ABNORMAL HIGH (ref 0.0–0.2)
nRBC: 1.6 % — ABNORMAL HIGH (ref 0.0–0.2)

## 2018-09-17 LAB — GLUCOSE, CAPILLARY
Glucose-Capillary: 158 mg/dL — ABNORMAL HIGH (ref 70–99)
Glucose-Capillary: 154 mg/dL — ABNORMAL HIGH (ref 70–99)
Glucose-Capillary: 111 mg/dL — ABNORMAL HIGH (ref 70–99)
Glucose-Capillary: 123 mg/dL — ABNORMAL HIGH (ref 70–99)

## 2018-09-17 LAB — APTT: aPTT: 33 seconds (ref 24–36)

## 2018-09-17 LAB — PREPARE RBC (CROSSMATCH)

## 2018-09-17 MED ORDER — SODIUM CHLORIDE 0.9% IV SOLUTION
Freq: Once | INTRAVENOUS | Status: AC
  Administered 2018-09-17: 13:00:00 via INTRAVENOUS

## 2018-09-17 MED ORDER — DIPHENHYDRAMINE HCL 25 MG PO CAPS
25.0000 mg | ORAL_CAPSULE | Freq: Once | ORAL | Status: AC
  Administered 2018-09-17: 25 mg via ORAL
  Filled 2018-09-17: qty 1

## 2018-09-17 MED ORDER — ACETAMINOPHEN 325 MG PO TABS
650.0000 mg | ORAL_TABLET | Freq: Once | ORAL | Status: AC
  Administered 2018-09-17: 650 mg via ORAL
  Filled 2018-09-17: qty 2

## 2018-09-17 NOTE — Progress Notes (Signed)
Pt again refused mobility, wound care and bath this AM, repeating that she wanted to be left alone. Pt asked if she needed pain control- pt refused.

## 2018-09-17 NOTE — Progress Notes (Signed)
Pt encouraged to allow RN and tech to turn pt to assist with skin integrity. Pt agitated and requests to be left alone. Pt states RN can assess wounds and turn in AM. Will monitor.

## 2018-09-17 NOTE — Progress Notes (Signed)
Stacy Shelton   DOB:Feb 22, 1960   BW#:[id number]    ASSESSMENT & PLAN:  Metastatic breast cancer ([place]) She has significant complication from each cycle of treatment despite dose adjustment The patient is unlikely going to improve to the point of being strong enough to receive further treatment.  Both the patient and family members are aware about the plan for no further chemotherapy and future plan will be to continue on supportive measures only Her intermittent confusion along with progressive pancytopenia are indicative of disease progression I do not recommend imaging study as it will not change management plan and the imaging studies might hurt the patient  Intermittent altered mental status Likely due to metabolic encephalopathy versus disease progression in her brain Today, she seems alert and able to make medical decision I do not recommend further imaging study as it would not change plan of care  Recurrent infection, resolved She had numerous UTI, E. Coli Completed a course ofampicillin IV  Acquired pancytopenia We had numerous discussion about the role of transfusion The patient is transfusion refractory The patient was educated about the rationale behind stopping transfusion support but she wants to continue transfusion regardless I will order a unit of irradiated blood today to keep hemoglobin greater than 8 If her platelet count dropped to less than 10,000, she will get 1 unit of platelet transfusion I do not recommend prophylactic G-CSF support in the absence of sepsis  Insulin dependent diabetes Continue insulin sliding scale She is noted to have recurrent hypoglycemia due to poor oral intake  Decubitus ulcers She has significant pain secondary to multiple new decubitus ulcers Wound care is consulted Per nursing documentation, the patient has repeatedly refused wound care assessment  Malnutrition of moderate degree She has significant malnutrition We have  discussed the importance of regular nutrition over the last 6 months but yet the patient continues to refuse regular intake I did not identify any barriers for her from eating I do not recommend feeding tube placementor total parenteral nutritional support The patient has no swallowing difficulties and denies nausea The cause of her malnutrition is patient's refusal to eat in majority of the cases She is currently prescribed Glucerna every 4 hours  Physical debility, critical myopathy She has not participated with physical therapy for over 1 month PT/OT has been consulted I reinforced the importance of getting physical therapy and Occupational Therapy in order to get better 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 I suggested Psychiatry consult. Patient declined  Code Status Goals of care Discharge planning I had numerous goals of care discussion with the patient and family multiple times. The patient is intermittently lucid The patient wanted full code and aggressive care She is aware that this decision would preclude going home as she is currently transfusion dependent  All questions were answered.    [name], MD I spent 40 minutes with patient counseling and goals of care discussion with family  Subjective:  I have seen the patient twice and reviewed plan of care with the family twice.  This morning, she was alert and oriented.  She did not recall conversation we had yesterday.  She was apprised about decision to transition  her care to comfort measures. The patient engage with goals of care discussion for approximately 30 minutes but then decided to stop the  conversation because she was overwhelmed with decision making.  She continues to have intermittent pain from pressure sores, alleviated by morphine I had another 45 minutes discussion with her mom and sister around 5:15 PM  All questions were answered. The patient knows to call the clinic with any problems, questions or concerns.   [name], MD 09/17/2018 12:09 PM  Subjective:  She appears to be alert and oriented and able to make medical decisions today. The patient was moved to telemetry floor since last night and most of the comfort care/palliative care orders had been rescinded Last night, she received transfusion support of platelets I saw her this morning and had another meeting with the patient, her sister and mother along with primary service around 60.  Before a walk-in to the meeting, the nursing staff informed me that someone called inquiring about [facility] although this was not confirmed by any family members about who might be calling for that Her sister was adamant that more visitors should be allowed to come to visit the patient in special circumstances should be considered because her sister need more emotional support and others.  She is also not happy with the fact that strict visitor policy has been reapplied to the patient's care since last evening. She denies pain.  She appears not happy when we asked her and addressed questions regarding transfusion support, nutritional support, physical therapy, Occupational Therapy and CODE STATUS According to documentation by nursing staff at 820 AM, the patient refused nursing staff from repositioning and assess her wound Earlier this morning at 3 AM and 6 AM, similar situation is also noted that the patient refused mobility, wound care and bath  Objective:  Vitals:   09/17/18 0918 09/17/18 0922  BP: (!) 127/56   Pulse: (!) 108   Resp: (!) 24   Temp: 97.9 F (36.6 C)   SpO2: 97% 98%     Intake/Output Summary (Last 24  hours) at 09/17/2018 1209 Last data filed at 09/17/2018 0841 Gross per 24 hour  Intake 320 ml  Output -  Net 320 ml    GENERAL:alert, no distress and comfortable SKIN: Noted significant bruising NEURO: alert & oriented x 3 with fluent speech,    Labs:  Recent Labs    05/06/18 0430  09/09/18 0525 09/10/18 0511  09/13/18 0500 09/14/18 0520 09/15/18 0500  NA 138   < > 130* 130*   < > 136 136 137  K 3.6   < > 3.0* 3.5   < > 3.1* 3.8 3.5  CL 99   < > 98 99   < > 101 100 99  CO2 32   < > 24 24   < > 28 29 29   GLUCOSE 138*   < > 250* 211*   < > 178* 102* 57*  BUN 14   < > 12 11   < > 16 13 14   CREATININE 0.54   < > 0.43* 0.34*   < > 0.32* <0.30* <0.30*  CALCIUM 7.8*   < > 7.3* 7.5*   < > 8.2* 8.3* 8.1*  GFRNONAA >60   < > >60 >60   < > >60 NOT CALCULATED NOT CALCULATED  GFRAA >60   < > >60 >60   < > >60 NOT CALCULATED NOT CALCULATED  PROT 5.4*   < > 5.3* 5.3*  --   --   --  5.5*  ALBUMIN 1.7*   < > 1.6* 1.4*  --   --   --  1.5*  AST 23   < > 13* 24  --   --   --  11*  ALT 14   < > 14 25  --   --   --  15  ALKPHOS 372*   < > 183* 353*  --   --   --  211*  BILITOT 2.0*   < > 2.2* 1.8*  --   --   --  1.7*  BILIDIR 1.0*  --   --   --   --   --   --   --   IBILI 1.0*  --   --   --   --   --   --   --    < > = values in this interval not displayed.    Studies:  Ct Head Wo Contrast  Result Date: 09/11/2018 CLINICAL DATA:  Altered level of consciousness. Metastatic breast cancer. EXAM: CT HEAD WITHOUT CONTRAST TECHNIQUE: Contiguous axial images were obtained from the base of the skull through the vertex without intravenous contrast. COMPARISON:  CT head 04/08/2018, MRI head 07/17/2018 FINDINGS: Brain: Previously noted edema in the right occipital lobe has resolved. No new area of edema or hemorrhage. Mild atrophy appears to have progressed. Mild patchy white matter hypodensity also has progressed. Question brain radiation. Vascular: Negative for hyperdense vessel Skull: Negative  Sinuses/Orbits: Prior sinus surgery. Mucoperiosteal thickening in the maxillary sinus bilaterally. Negative orbit. Other: None IMPRESSION: 1. Interval resolution of edema in the right occipital lobe related to metastatic disease on prior studies. Current study was done without intravenous contrast and is not adequate to evaluate for residual or recurrent metastatic disease. 2. Progression of atrophy and white matter changes since the prior study. Question related to brain radiation. Electronically Signed   By: Franchot Gallo M.D.   On: 09/11/2018 12:04   Dg Chest Portable 1 View  Result Date: 09/18/2018 CLINICAL DATA:  58 year old female with weakness. History of metastatic breast cancer. EXAM: PORTABLE CHEST 1 VIEW COMPARISON:  Chest radiograph dated 06/22/2018 and CT dated 06/11/2018 FINDINGS: There is shallow inspiration with bibasilar atelectasis. A 4.4 cm rounded opacity in the right upper lobe corresponds to density seen on the prior CT and may represent metastatic disease or primary lung cancer. Additional smaller 14 mm nodular density noted in the right upper lobe more laterally. There is probable small bilateral pleural effusions. No pneumothorax. There is mild cardiomegaly. Atherosclerotic calcification of aortic arch. Right sided Port-A-Cath with tip in the region of the cavoatrial junction. No acute osseous pathology. IMPRESSION: 1. Small bilateral pleural effusions and bibasilar atelectasis or infiltrate. 2. Right upper lobe rounded opacity corresponding to the density seen on the prior CT. An additional 14 mm right upper lobe pulmonary nodule noted. Electronically Signed   By: Anner Crete M.D.   On: 09/20/2018 19:07   Vas [place] Upper Extremity Venous Duplex  Result Date: 09/11/2018 UPPER VENOUS STUDY  Indications: Pain, Swelling, and Severe bruising Limitations: Unable to position the patient for optimal imaging. Comparison Study: No previous study available for comparison Performing  Technologist: Toma Copier RVS  Examination Guidelines: A complete evaluation includes B-mode imaging, spectral Doppler, color Doppler, and power Doppler as needed of all accessible portions of each vessel. Bilateral testing is considered an integral part of a complete examination. Limited examinations for reoccurring indications may be performed as noted.  Right  Findings: +----------+------------+---------+-----------+----------+--------------+ RIGHT     CompressiblePhasicitySpontaneousProperties   Summary     +----------+------------+---------+-----------+----------+--------------+ IJV                                                 Not visualized +----------+------------+---------+-----------+----------+--------------+ Subclavian                                          Not visualized +----------+------------+---------+-----------+----------+--------------+ Unable to Visulize the IJV or subclavian due to position of the patient and neck  Left Findings: +----------+------------+---------+-----------+----------+--------------------+ LEFT      CompressiblePhasicitySpontaneousProperties      Summary        +----------+------------+---------+-----------+----------+--------------------+ IJV           Full       Yes       Yes                  Difficult to                                                              visualize       +----------+------------+---------+-----------+----------+--------------------+ Subclavian               Yes       Yes                  Difficult to                                                              visualize       +----------+------------+---------+-----------+----------+--------------------+ Axillary      Full                                                       +----------+------------+---------+-----------+----------+--------------------+ Brachial      Full       Yes       Yes                                    +----------+------------+---------+-----------+----------+--------------------+ Radial        Full                                   Difficult to image  +----------+------------+---------+-----------+----------+--------------------+ Ulnar         Full                                   Difficult to image  +----------+------------+---------+-----------+----------+--------------------+ Cephalic  Full                                                       +----------+------------+---------+-----------+----------+--------------------+ Basilic       Full                                                       +----------+------------+---------+-----------+----------+--------------------+  Summary:  Right: Unable to visualize the IJV and subclavian due to position of the patient and neck.  Left: This was a limited study. There is no obvious evidence of DVT. However limited visualization due to sub optimal postioning of the arm due to pain.  *See table(s) above for measurements and observations.  Diagnosing physician: Curt Jews MD Electronically signed by Curt Jews MD on 09/11/2018 at 7:12:31 AM.    Final

## 2018-09-17 NOTE — Progress Notes (Signed)
Patient refused to allow writer to reposition and assess her wounds. Patient states she doesnot want to turn she is scared. Writer attempted to reassure patient but patient still refused.

## 2018-09-17 NOTE — Progress Notes (Signed)
Writer entered room to assess patients skin/wounds  and noticed that the bed is wet.  Writer explained to patient that she needs to be cleaned up and fresh pads replaced to prevent breaking down skin any further. Patient states no,  she does not want to be turned. I discussed situation with with charge and Financial planner. Writer persuaded patient to allow staff  to remove soiled linen and clean her up. Patient agreed but would not allow writer to measure wounds and replace dressing.

## 2018-09-17 NOTE — Progress Notes (Signed)
Manufacturing engineer [facility]) Hospice  Received a phone call from pts sister last night, she left a message saying they again wanted placement at [facility].  Advised RN Case Manager Juliann Pulse of their request.  [facility] does not have a bed available today.  Venia Carbon RN, BSN, [facility] Liaison (in [place]) [phone]

## 2018-09-17 NOTE — TOC Progression Note (Signed)
Transition of Care [facility]) - Progression Note    Patient Details  Name: SENG FOUTS MRN: [id number] Date of Birth: 12/16/1960  Transition of Care [facility]) CM/SW Contact  Deveion Denz, Juliann Pulse, RN Phone Number: 09/17/2018, 12:18 PM  Clinical Narrative: Noted per conference-aggressive care, full code.CM will continue to assist with d/c plans.      Expected Discharge Plan: [place] Barriers to Discharge: Continued Medical Work up  Expected Discharge Plan and Services Expected Discharge Plan: [place]   Discharge Planning Services: CM Consult   Living arrangements for the past 2 months: Single Family Home                                       Social Determinants of Health (SDOH) Interventions    Readmission Risk Interventions Readmission Risk Prevention Plan 09/08/2018 06/13/2018 04/25/2018  Transportation Screening Complete Complete -  Medication Review Press photographer) Complete Complete -  PCP or Specialist appointment within 3-5 days of discharge Not Complete Not Complete (No Data)  PCP/Specialist Appt Not Complete comments not ready to dc not close to discharge -  [place] or Home Care Consult Complete Complete -  SW Recovery Care/Counseling Consult Complete Not Complete -  SW Consult Not Complete Comments - no evidence of need at this time -  Palliative Care Screening Not Applicable Not Applicable -  [place] Not Applicable Not Applicable -  Some recent data might be hidden

## 2018-09-17 NOTE — Progress Notes (Signed)
PROGRESS NOTE    Stacy Shelton  HQI:[id number] DOB: [birth year] DOA: 09/25/2018 PCP: Robyne Peers, MD    Brief Narrative:  Patient is a 58 year old Caucasian female with past medical history of metastatic breast cancer, diabetes mellitus type 2, asthma was brought to hospital after altered mental status. Found to be hypoglycemic with blood glucose 19. She was started on broad-spectrum antibiotic for infectious etiology. Also patient found to be severely anemic and thrombocytopenic. Transfused PRBC and platelets. Patient was hypotensive and required brief Levophed infusion on 8/10, off on 8/11. Patient initially was comfort measures however per oncology, mother and sister stated they never made decision to transfer her care to comfort measures and as such comfort orders were rescinded by oncology and patient now reverted back to a full CODE STATUS and patient wanting aggressive care at this time.    Assessment & Plan:   Principal Problem:   Sepsis ([place]) Active Problems:   Thrombocytopenia ([place])   Uncontrolled diabetes mellitus with hyperglycemia (H[facility])   Metastatic breast cancer (H[facility])   Pancytopenia, acquired ([place])   Acute lower UTI   Anemia associated with chemotherapy   Decubitus ulcers   Acute encephalopathy   Metastatic cancer to brain [facility])   Palliative care encounter   Metastatic cancer ([place])   Comfort measures only status   Encounter for hospice care discussion  1 sepsis with septic shock secondary to enterococcus UTI Patient during the hospitalization met criteria for sepsis at time of admission with low-grade fever, hypotension, tachycardia, tachypnea, hypoxia and sore throat likely secondary to UTI.  Patient was on vasopressors of Levophed initially and weaned off on 09/09/2018.  Patient pancultured and urine cultures positive for enterococcus.  Patient was initially on vancomycin and subsequently transitioned to IV cefepime and has received a total of 7 days of  antibiotics.  Patient currently afebrile.  No further antibiotics needed at this time.  2.  Pancytopenia with thrombocytopenia Felt likely secondary to bone marrow involvement of metastatic breast cancer.  Patient has received a total of 6 units packed red blood cells as well as 6 units of platelets.  Patient's anemia and thrombocytopenia appear to be refractory to transfusions.  Was initially thought to have worsening due to antibiotics of Vanco and cefepime which have subsequently been discontinued with no significant improvement.  Patient currently wants aggressive care.  Patient to be transfused a unit of packed red blood cells as well as a unit of platelets today.  Per oncology.  3.  Uncontrolled diabetes mellitus with hyperglycemia Hemoglobin A1c of 6.7 on 06/11/2018.  CBG of 158 this morning.  Patient noted to have bouts of hypoglycemia secondary to poor oral intake.  Levemir decreased to 10 units daily nightly has been discontinued.  Continue sliding scale insulin.  4.  Multiple decubitus ulcers, POA Per nursing documentation patient with bilateral ear pressure injuries, stage I, POA, upper back pressure injury stage II, POA, sacral pressure injury unstageable, POA, left heel pressure injury, unstageable,POA Right thigh pressure injury, unstageable POA.  Per nursing patient refusing to be turned.  Continue current wound care if patient allows.  Follow.  5.  Deconditioning/debility Patient has not had physical therapy in at least 3 weeks.  Patient very deconditioned with myopathy of critical illness and poor oral intake.  Patient noted to be refusing initially during the hospitalization.  Patient is in agreement and motivated for physical therapy.  Will reorder PT/OT.  Patient wanting aggressive care at this time.  6.  History of  mild diastolic dysfunction 2D echo from May 2020 with a EF of 60 to 65%.  Patient currently euvolemic.  Monitor closely with multiple transfusions.  Strict I's and  O's.  Daily weights.  7.  Acute metabolic encephalopathy Patient with waxing and waning mental status felt multifactorial secondary to critical illness, UTI, poor oral intake, debility, mets to the brain.  Head CT on 09/11/2018- for any bleed or acute CVA.  Oncology following.  Palliative care following.  8.  Hypoalbuminemia, moderate degree of malnutrition, severe deconditioning Albumin noted to be at 1.4.  Continue current nutritional supplementation of pro-stat, Glucerna.  Patient encouraged to increase oral intake.  9.  Metastatic breast cancer Patient had goals of care meeting on 09/15/2018 in the presence of her family and at that time patient was somewhat confused.  Family requested DNR, comfort care and possible residential hospice.  Patient noted to have changed her mind and wanting full scope of treatment.  Goals of care meeting have been done with oncology and palliative care medicine on 09/16/2018.  Meeting again today 09/17/2018 with family and patient wanting full scope of care at this time.  Per oncology.  Per oncology no further plans for chemotherapy and to continue supportive care at this time.   DVT prophylaxis: SCDs Code Status: Full Family Communication: Patient, sister, mother updated at bedside in family meeting with Dr. Alvy Bimler. Disposition Plan: Likely discharge when clinically stable and okay with oncology.   Consultants:   Oncology: Dr. Alvy Bimler  Palliative care: Florentina Jenny, PA 09/14/2018  Wound care  Procedures:   CT head 09/11/2018  Chest x-ray 09/23/2018  Upper extremity Dopplers 09/10/2018  Transfused total of 6 units packed red blood cells  Transfused total of 6 units platelets  Antimicrobials:   IV ampicillin 09/10/2018>>>> 09/14/2018  IV cefepime 09/08/2018>>>> 09/10/2018  IV vancomycin 09/18/2018>>>> 09/10/2018   Subjective: Patient states had Glucerna for breakfast.  When asked whether she ate anything oral she states leave me alone.  Patient  denies any chest pain or shortness of breath.  Patient denies any abdominal pain.  Patient denies any overt bleeding.  Objective: Vitals:   09/17/18 0112 09/17/18 0344 09/17/18 0918 09/17/18 0922  BP: (!) 128/48 (!) 111/59 (!) 127/56   Pulse: (!) 109 96 (!) 108   Resp: 20 20 (!) 24   Temp: 99.5 F (37.5 C) 98.7 F (37.1 C) 97.9 F (36.6 C)   TempSrc: Oral Oral Oral   SpO2: 98% 100% 97% 98%  Weight:      Height:        Intake/Output Summary (Last 24 hours) at 09/17/2018 1101 Last data filed at 09/17/2018 0841 Gross per 24 hour  Intake 320 ml  Output -  Net 320 ml   Filed Weights   09/08/18 0000 09/14/18 0643  Weight: 57.4 kg 59.2 kg    Examination:  General exam: Appears calm and comfortable  Respiratory system: Clear to auscultation anterior lung fields. Respiratory effort normal. Cardiovascular system: S1 & S2 heard, RRR. No JVD, murmurs, rubs, gallops or clicks. No pedal edema. Gastrointestinal system: Abdomen is nondistended, soft and nontender. No organomegaly or masses felt. Normal bowel sounds heard. Central nervous system: Alert and oriented. No focal neurological deficits. Extremities: Symmetric 5 x 5 power. Skin: No rashes, lesions or ulcers Psychiatry: Judgement and insight appear normal. Mood & affect appropriate.     Data Reviewed: I have personally reviewed following labs and imaging studies  CBC: Recent Labs  Lab 09/10/18 1522  09/11/18  1735  09/12/18 1910 09/13/18 0500 09/14/18 0520 09/15/18 0500 09/16/18 1840 09/17/18 0442  WBC 3.1*   < > 2.9*   < > 2.3* 2.2* 2.5* 2.0* 1.8* 1.8*  NEUTROABS 1.6*  --  1.5*  --  1.1*  --   --   --  0.6* 0.6*  HGB 9.0*   < > 9.5*   < > 8.6* 8.2* 8.5* 7.7* 7.9* 7.5*  HCT 27.0*   < > 29.0*   < > 26.9* 25.7* 26.3* 24.2* 24.4* 23.3*  MCV 86.8   < > 89.2   < > 90.6 91.5 92.3 93.1 90.4 91.0  PLT 10*   < > 9*   < > 45* 30* 16* 7* 8* 21*   < > = values in this interval not displayed.   Basic Metabolic Panel: Recent  Labs  Lab 09/11/18 0537 09/12/18 0300 09/13/18 0500 09/14/18 0520 09/14/18 1744 09/15/18 0500  NA 132* 133* 136 136  --  137  K 3.1* 3.8 3.1* 3.8  --  3.5  CL 100 102 101 100  --  99  CO2 25 26 28 29   --  29  GLUCOSE 99 222* 178* 102*  --  57*  BUN 12 20 16 13   --  14  CREATININE 0.37* 0.37* 0.32* <0.30*  --  <0.30*  CALCIUM 7.7* 8.0* 8.2* 8.3*  --  8.1*  MG  --   --   --   --   --  1.9  PHOS  --   --   --   --  3.6  --    GFR: CrCl cannot be calculated (This lab value cannot be used to calculate CrCl because it is not a number: <0.30). Liver Function Tests: Recent Labs  Lab 09/15/18 0500  AST 11*  ALT 15  ALKPHOS 211*  BILITOT 1.7*  PROT 5.5*  ALBUMIN 1.5*   No results for input(s): LIPASE, AMYLASE in the last 168 hours. No results for input(s): AMMONIA in the last 168 hours. Coagulation Profile: No results for input(s): INR, PROTIME in the last 168 hours. Cardiac Enzymes: No results for input(s): CKTOTAL, CKMB, CKMBINDEX, TROPONINI in the last 168 hours. BNP (last 3 results) No results for input(s): PROBNP in the last 8760 hours. HbA1C: No results for input(s): HGBA1C in the last 72 hours. CBG: Recent Labs  Lab 09/15/18 0802 09/15/18 0833 09/15/18 1145 09/16/18 2113 09/17/18 0746  GLUCAP 42* 169* 101* 239* 158*   Lipid Profile: No results for input(s): CHOL, HDL, LDLCALC, TRIG, CHOLHDL, LDLDIRECT in the last 72 hours. Thyroid Function Tests: No results for input(s): TSH, T4TOTAL, FREET4, T3FREE, THYROIDAB in the last 72 hours. Anemia Panel: Recent Labs    09/14/18 1744  VITAMINB12 2,745*  FOLATE 9.6   Sepsis Labs: No results for input(s): PROCALCITON, LATICACIDVEN in the last 168 hours.  Recent Results (from the past 240 hour(s))  Culture, blood (routine x 2)     Status: None   Collection Time: 08/31/2018  6:06 PM   Specimen: BLOOD  Result Value Ref Range Status   Specimen Description   Final    BLOOD PICC LINE Performed at [facility], [place] [address], [place], [place] [address]    Special Requests   Final    BOTTLES DRAWN AEROBIC AND ANAEROBIC Blood Culture adequate volume Performed at [facility] [address], [place], [place] [address]    Culture   Final    NO GROWTH 5  DAYS Performed at [facility], [place] [address], [place], [place] [address]    Report Status 09/12/2018 FINAL  Final  SARS Coronavirus 2 [facility] order, Performed in [facility] hospital lab) Nasopharyngeal Nasopharyngeal Swab     Status: None   Collection Time: 09/08/2018  6:11 PM   Specimen: Nasopharyngeal Swab  Result Value Ref Range Status   SARS Coronavirus 2 NEGATIVE NEGATIVE Final    Comment: (NOTE) If result is NEGATIVE SARS-CoV-2 target nucleic acids are NOT DETECTED. The SARS-CoV-2 RNA is generally detectable in upper and lower  respiratory specimens during the acute phase of infection. The lowest  concentration of SARS-CoV-2 viral copies this assay can detect is 250  copies / mL. A negative result does not preclude SARS-CoV-2 infection  and should not be used as the sole basis for treatment or other  patient management decisions.  A negative result may occur with  improper specimen collection / handling, submission of specimen other  than nasopharyngeal swab, presence of viral mutation(s) within the  areas targeted by this assay, and inadequate number of viral copies  (<250 copies / mL). A negative result must be combined with clinical  observations, patient history, and epidemiological information. If result is POSITIVE SARS-CoV-2 target nucleic acids are DETECTED. The SARS-CoV-2 RNA is generally detectable in upper and lower  respiratory specimens dur ing the acute phase of infection.  Positive  results are indicative of active infection with SARS-CoV-2.  Clinical  correlation with patient history and other diagnostic information is  necessary to determine patient infection status.   Positive results do  not rule out bacterial infection or co-infection with other viruses. If result is PRESUMPTIVE POSTIVE SARS-CoV-2 nucleic acids MAY BE PRESENT.   A presumptive positive result was obtained on the submitted specimen  and confirmed on repeat testing.  While 2019 novel coronavirus  (SARS-CoV-2) nucleic acids may be present in the submitted sample  additional confirmatory testing may be necessary for epidemiological  and / or clinical management purposes  to differentiate between  SARS-CoV-2 and other Sarbecovirus currently known to infect humans.  If clinically indicated additional testing with an alternate test  methodology [phone]) is advised. The SARS-CoV-2 RNA is generally  detectable in upper and lower respiratory sp ecimens during the acute  phase of infection. The expected result is Negative. Fact Sheet for Patients:  StrictlyIdeas.no Fact Sheet for Healthcare Providers: BankingDealers.co.za This test is not yet approved or cleared by the [place] FDA and has been authorized for detection and/or diagnosis of SARS-CoV-2 by FDA under an Emergency Use Authorization (EUA).  This EUA will remain in effect (meaning this test can be used) for the duration of the COVID-19 declaration under Section 564(b)(1) of the Act, 21 U.S.C. section 360bbb-3(b)(1), unless the authorization is terminated or revoked sooner. Performed at [facility], [place] [address], [place], [place] [address]   MRSA PCR Screening     Status: None   Collection Time: 08/30/2018 11:25 PM   Specimen: Nasal Mucosa; Nasopharyngeal  Result Value Ref Range Status   MRSA by PCR NEGATIVE NEGATIVE Final    Comment:        The GeneXpert MRSA Assay (FDA approved for NASAL specimens only), is one component of a comprehensive MRSA colonization surveillance program. It is not intended to diagnose MRSA infection nor to guide or monitor  treatment for MRSA infections. Performed at [facility], [place] [address] [place], [place] [address]   Culture, blood (routine  x 2)     Status: None   Collection Time: 09/08/18 12:31 AM   Specimen: BLOOD LEFT HAND  Result Value Ref Range Status   Specimen Description   Final    BLOOD LEFT HAND Performed at [facility], [place] [address], [place], [place] [address]    Special Requests   Final    BOTTLES DRAWN AEROBIC ONLY Blood Culture results may not be optimal due to an inadequate volume of blood received in culture bottles Performed at [place] [address], [place], [place] [address]    Culture   Final    NO GROWTH 5 DAYS Performed at [facility], [place] [address] [place], [place] [address]    Report Status 09/13/2018 FINAL  Final  Urine culture     Status: Abnormal   Collection Time: 09/08/18  2:55 AM   Specimen: Urine, Random  Result Value Ref Range Status   Specimen Description   Final    URINE, RANDOM Performed at [place] [address], [place], [place] [address]    Special Requests   Final    NONE Performed at [facility], [place] [address], [place], [place] [address]    Culture >=100,000 COLONIES/mL ENTEROCOCCUS FAECALIS (A)  Final   Report Status 09/10/2018 FINAL  Final   Organism ID, Bacteria ENTEROCOCCUS FAECALIS (A)  Final      Susceptibility   Enterococcus faecalis - MIC*    AMPICILLIN <=2 SENSITIVE Sensitive     LEVOFLOXACIN 2 SENSITIVE Sensitive     NITROFURANTOIN <=16 SENSITIVE Sensitive     VANCOMYCIN 1 SENSITIVE Sensitive     * >=100,000 COLONIES/mL ENTEROCOCCUS FAECALIS         Radiology Studies: No results found.      Scheduled Meds: . sodium chloride   Intravenous Once  . amiodarone  200 mg Oral Daily  . Chlorhexidine Gluconate Cloth  6 each Topical Daily  . feeding supplement (GLUCERNA SHAKE)  237 mL Oral Q4H  . feeding supplement  (PRO-STAT SUGAR FREE 64)  30 mL Oral TID  . insulin aspart  0-5 Units Subcutaneous QHS  . insulin aspart  0-9 Units Subcutaneous TID WC  . mouth rinse  15 mL Mouth Rinse BID  . montelukast  10 mg Oral QHS  . pantoprazole  40 mg Oral Daily  . sodium chloride flush  10-40 mL Intracatheter Q12H   Continuous Infusions:   LOS: 10 days    Time spent: 40 minutes    Irine Seal, MD [facility]  If 7PM-7AM, please contact night-coverage [email or web address] 09/17/2018, 11:01 AM

## 2018-09-18 LAB — PREPARE PLATELET PHERESIS: Unit division: 0

## 2018-09-18 LAB — TYPE AND SCREEN
ABO/RH(D): A POS
Antibody Screen: NEGATIVE
Unit division: 0
Unit division: 0

## 2018-09-18 LAB — CBC WITH DIFFERENTIAL/PLATELET
Abs Immature Granulocytes: 0.07 K/uL (ref 0.00–0.07)
Basophils Absolute: 0.1 K/uL (ref 0.0–0.1)
Basophils Relative: 7 %
Eosinophils Absolute: 0 K/uL (ref 0.0–0.5)
Eosinophils Relative: 0 %
HCT: 29.3 % — ABNORMAL LOW (ref 36.0–46.0)
Hemoglobin: 9.4 g/dL — ABNORMAL LOW (ref 12.0–15.0)
Immature Granulocytes: 4 %
Lymphocytes Relative: 34 %
Lymphs Abs: 0.6 K/uL — ABNORMAL LOW (ref 0.7–4.0)
MCH: 29.6 pg (ref 26.0–34.0)
MCHC: 32.1 g/dL (ref 30.0–36.0)
MCV: 92.1 fL (ref 80.0–100.0)
Monocytes Absolute: 0.2 K/uL (ref 0.1–1.0)
Monocytes Relative: 9 %
Neutro Abs: 0.8 K/uL — ABNORMAL LOW (ref 1.7–7.7)
Neutrophils Relative %: 46 %
Platelets: 10 K/uL — CL (ref 150–400)
RBC: 3.18 MIL/uL — ABNORMAL LOW (ref 3.87–5.11)
RDW: 16.2 % — ABNORMAL HIGH (ref 11.5–15.5)
WBC: 1.7 K/uL — ABNORMAL LOW (ref 4.0–10.5)
nRBC: 0 % (ref 0.0–0.2)

## 2018-09-18 LAB — BPAM PLATELET PHERESIS
Blood Product Expiration Date: 202008192359
ISSUE DATE / TIME: 202008190048
Unit Type and Rh: 9500

## 2018-09-18 LAB — RENAL FUNCTION PANEL
Albumin: 1.4 g/dL — ABNORMAL LOW (ref 3.5–5.0)
Anion gap: 6 (ref 5–15)
BUN: 12 mg/dL (ref 6–20)
CO2: 30 mmol/L (ref 22–32)
Calcium: 7.4 mg/dL — ABNORMAL LOW (ref 8.9–10.3)
Chloride: 97 mmol/L — ABNORMAL LOW (ref 98–111)
Creatinine, Ser: <0.3 mg/dL — ABNORMAL LOW (ref 0.44–1.00)
Glucose, Bld: 194 mg/dL — ABNORMAL HIGH (ref 70–99)
Phosphorus: 3.8 mg/dL (ref 2.5–4.6)
Potassium: 3.4 mmol/L — ABNORMAL LOW (ref 3.5–5.1)
Sodium: 133 mmol/L — ABNORMAL LOW (ref 135–145)

## 2018-09-18 LAB — GLUCOSE, CAPILLARY
Glucose-Capillary: 230 mg/dL — ABNORMAL HIGH (ref 70–99)
Glucose-Capillary: 176 mg/dL — ABNORMAL HIGH (ref 70–99)
Glucose-Capillary: 163 mg/dL — ABNORMAL HIGH (ref 70–99)
Glucose-Capillary: 129 mg/dL — ABNORMAL HIGH (ref 70–99)

## 2018-09-18 LAB — BPAM RBC
Blood Product Expiration Date: 202008302359
Blood Product Expiration Date: 202009142359
ISSUE DATE / TIME: 202008171433
ISSUE DATE / TIME: 202008191334
Unit Type and Rh: 6200
Unit Type and Rh: 6200

## 2018-09-18 LAB — VITAMIN B1: Vitamin B1 (Thiamine): 44.9 nmol/L — ABNORMAL LOW (ref 66.5–200.0)

## 2018-09-18 LAB — COPPER, SERUM: Copper: 116 ug/dL (ref 72–166)

## 2018-09-18 MED ORDER — POTASSIUM CHLORIDE CRYS ER 20 MEQ PO TBCR
20.0000 meq | EXTENDED_RELEASE_TABLET | Freq: Once | ORAL | Status: AC
  Administered 2018-09-18: 20 meq via ORAL
  Filled 2018-09-18: qty 1

## 2018-09-18 MED ORDER — ALBUMIN HUMAN 25 % IV SOLN
25.0000 g | Freq: Four times a day (QID) | INTRAVENOUS | Status: AC
  Administered 2018-09-18 – 2018-09-19 (×4): 25 g via INTRAVENOUS
  Filled 2018-09-18 (×4): qty 100

## 2018-09-18 MED ORDER — HYDROMORPHONE HCL 1 MG/ML IJ SOLN
0.5000 mg | Freq: Once | INTRAMUSCULAR | Status: AC
  Administered 2018-09-18: 0.5 mg via INTRAVENOUS
  Filled 2018-09-18: qty 0.5

## 2018-09-18 NOTE — Progress Notes (Addendum)
PROGRESS NOTE    Stacy Shelton  PNT:[id number] DOB: 1960-06-27 DOA: 09/08/2018 PCP: Robyne Peers, MD    Brief Narrative:  Patient is a 58 year old Caucasian female with past medical history of metastatic breast cancer, diabetes mellitus type 2, asthma was brought to hospital after altered mental status. Found to be hypoglycemic with blood glucose 19. She was started on broad-spectrum antibiotic for infectious etiology. Also patient found to be severely anemic and thrombocytopenic. Transfused PRBC and platelets. Patient was hypotensive and required brief Levophed infusion on 8/10, off on 8/11. Patient initially was comfort measures however per oncology, mother and sister stated they never made decision to transfer her care to comfort measures and as such comfort orders were rescinded by oncology and patient now reverted back to a full CODE STATUS and patient wanting aggressive care at this time.    Assessment & Plan:   Principal Problem:   Sepsis ([place]) Active Problems:   Thrombocytopenia ([place])   Uncontrolled diabetes mellitus with hyperglycemia (HCC)   Metastatic breast cancer (HCC)   Pancytopenia, acquired ([place])   Acute lower UTI   Anemia associated with chemotherapy   Decubitus ulcers   Acute encephalopathy   Metastatic cancer to brain [facility])   Palliative care encounter   Metastatic cancer ([place])   Comfort measures only status   Encounter for hospice care discussion  1 sepsis with septic shock secondary to enterococcus UTI Patient during the hospitalization met criteria for sepsis at time of admission with low-grade fever, hypotension, tachycardia, tachypnea, hypoxia and sore throat likely secondary to UTI.  Patient was on vasopressors of Levophed initially and weaned off on 09/09/2018.  Patient pancultured and urine cultures positive for enterococcus.  Patient was initially on vancomycin and subsequently transitioned to IV cefepime and has received a total of 7 days of  antibiotics.  Patient currently afebrile.  No further antibiotics needed at this time.  2.  Pancytopenia with thrombocytopenia Felt likely secondary to bone marrow involvement of metastatic breast cancer.  Patient has received a total of 6 units packed red blood cells as well as 6 units of platelets.  Patient's anemia and thrombocytopenia appear to be refractory to transfusions.  Was initially thought to have worsening due to antibiotics of Vanco and cefepime which have subsequently been discontinued with no significant improvement.  Patient currently wants aggressive care.  Patient count of 10, hemoglobin of 9.4.  No overt bleeding.  Will defer transfusion needs to oncology.  Per oncology.  3.  Uncontrolled diabetes mellitus with hyperglycemia Hemoglobin A1c of 6.7 on 06/11/2018.  CBG of 230 this morning.  Patient noted to have bouts of hypoglycemia secondary to poor oral intake.  Levemir decreased to 10 units daily nightly has been discontinued.  Continue sliding scale insulin.  Due to poor oral intake may need to liberalize diet however will reassess tomorrow.  Continue sliding scale insulin.  4.  Multiple decubitus ulcers, POA Per nursing documentation patient with bilateral ear pressure injuries, stage I, POA, upper back pressure injury stage II, POA, sacral pressure injury unstageable, POA, left heel pressure injury, unstageable,POA Right thigh pressure injury, unstageable POA.  Per nursing patient refusing to be turned yesterday.  Patient agreed with wound care changes done today however stated was in significant pain.  Advised nursing to premedicate prior to wound care changing.  Continue current wound care if patient allows.  Follow.  5.  Deconditioning/debility Patient has not had physical therapy in at least 3 weeks.  Patient very deconditioned with  myopathy of critical illness and poor oral intake.  Patient noted to be refusing initially during the hospitalization.  Patient is in agreement  and motivated for physical therapy.  PT OT has been reordered.  Will likely need SNF on discharge. Patient wanting aggressive care at this time.  6.  History of mild diastolic dysfunction 2D echo from May 2020 with a EF of 60 to 65%.  Patient currently euvolemic.  Monitor closely with multiple transfusions.  Strict I's and O's.  Daily weights.  7.  Acute metabolic encephalopathy Patient with waxing and waning mental status felt multifactorial secondary to critical illness, UTI, poor oral intake, debility, mets to the brain.  Head CT on 09/11/2018- for any bleed or acute CVA.  Oncology following.  Palliative care following.  8.  Hypoalbuminemia, moderate degree of malnutrition, severe deconditioning Albumin noted to be at 1.4.  Continue current nutritional supplementation of pro-stat, Glucerna.  Patient encouraged to increase oral intake.  Patient with poor oral intake.  Monitor CBGs may need to liberalize diet to help with nutrition.  Will place on IV albumin every 6 hours x24 hours.  9.  Hypokalemia Replete.  10.  Metastatic breast cancer Patient had goals of care meeting on 09/15/2018 in the presence of her family and at that time patient was somewhat confused.  Family requested DNR, comfort care and possible residential hospice.  Patient noted to have changed her mind and wanting full scope of treatment.  Goals of care meeting have been done with oncology and palliative care medicine on 09/16/2018.  Meeting again today 09/17/2018 with family and patient wanting full scope of care at this time.  Per oncology.  Per oncology no further plans for chemotherapy and to continue supportive care at this time.   DVT prophylaxis: SCDs Code Status: Full Family Communication: Patient, sister updated at bedside. Disposition Plan: Transfer to [place].  Patient require an inpatient treatment and management as patient is transfusion dependent at this time and wanted to be full code with aggressive care.  Likely  discharge when clinically stable and okay with oncology.   Consultants:   Oncology: Dr. Alvy Bimler  Palliative care: Florentina Jenny, PA 09/14/2018  Wound care  Procedures:   CT head 09/11/2018  Chest x-ray 09/01/2018  Upper extremity Dopplers 09/10/2018  Transfused total of 6 units packed red blood cells  Transfused total of 6 units platelets  Antimicrobials:   IV ampicillin 09/10/2018>>>> 09/14/2018  IV cefepime 09/08/2018>>>> 09/10/2018  IV vancomycin 09/28/2018>>>> 09/10/2018   Subjective: Patient still with minimal oral intake.  Did not eat much for breakfast.  Sister at bedside.  Denies any chest pain or shortness of breath.  Denies any bleeding.  Patient states was in a lot of pain with wound care dressing changes.   Objective: Vitals:   09/17/18 1644 09/17/18 1953 09/17/18 2147 09/18/18 0110  BP: (!) 108/54 (!) 119/57  (!) 107/58  Pulse: 90 (!) 111  88  Resp: (!) 22 17  18   Temp: 97.6 F (36.4 C) 99.3 F (37.4 C) 99.5 F (37.5 C) 98.7 F (37.1 C)  TempSrc: Oral  Axillary   SpO2: 100% 100%  100%  Weight:      Height:        Intake/Output Summary (Last 24 hours) at 09/18/2018 1310 Last data filed at 09/17/2018 1800 Gross per 24 hour  Intake 398.83 ml  Output 300 ml  Net 98.83 ml   Filed Weights   09/08/18 0000 09/14/18 0643  Weight: 57.4 kg  59.2 kg    Examination:  General exam: NAD Respiratory system: CTAB.  No wheezes, no crackles, no rhonchi anterior lung fields.  Normal respiratory effort.  Cardiovascular system: Regular rate rhythm no murmurs rubs or gallops.  No JVD.  No lower extremity edema. Gastrointestinal system: Abdomen is soft, nontender, nondistended, positive bowel sounds.  No rebound.  No guarding.  Central nervous system: Alert and oriented. No focal neurological deficits. Extremities: Symmetric 5 x 5 power. Skin: No rashes, lesions or ulcers Psychiatry: Judgement and insight appear normal. Mood & affect appropriate.     Data  Reviewed: I have personally reviewed following labs and imaging studies  CBC: Recent Labs  Lab 09/12/18 1910  09/15/18 0500 09/16/18 1840 09/17/18 0442 09/17/18 1954 09/18/18 0258  WBC 2.3*   < > 2.0* 1.8* 1.8* 1.8* 1.7*  NEUTROABS 1.1*  --   --  0.6* 0.6* 0.8* 0.8*  HGB 8.6*   < > 7.7* 7.9* 7.5* 9.9* 9.4*  HCT 26.9*   < > 24.2* 24.4* 23.3* 30.5* 29.3*  MCV 90.6   < > 93.1 90.4 91.0 91.6 92.1  PLT 45*   < > 7* 8* 21* 13* 10*   < > = values in this interval not displayed.   Basic Metabolic Panel: Recent Labs  Lab 09/12/18 0300 09/13/18 0500 09/14/18 0520 09/14/18 1744 09/15/18 0500 09/18/18 0258  NA 133* 136 136  --  137 133*  K 3.8 3.1* 3.8  --  3.5 3.4*  CL 102 101 100  --  99 97*  CO2 26 28 29   --  29 30  GLUCOSE 222* 178* 102*  --  57* 194*  BUN 20 16 13   --  14 12  CREATININE 0.37* 0.32* <0.30*  --  <0.30* <0.30*  CALCIUM 8.0* 8.2* 8.3*  --  8.1* 7.4*  MG  --   --   --   --  1.9  --   PHOS  --   --   --  3.6  --  3.8   GFR: CrCl cannot be calculated (This lab value cannot be used to calculate CrCl because it is not a number: <0.30). Liver Function Tests: Recent Labs  Lab 09/15/18 0500 09/18/18 0258  AST 11*  --   ALT 15  --   ALKPHOS 211*  --   BILITOT 1.7*  --   PROT 5.5*  --   ALBUMIN 1.5* 1.4*   No results for input(s): LIPASE, AMYLASE in the last 168 hours. No results for input(s): AMMONIA in the last 168 hours. Coagulation Profile: No results for input(s): INR, PROTIME in the last 168 hours. Cardiac Enzymes: No results for input(s): CKTOTAL, CKMB, CKMBINDEX, TROPONINI in the last 168 hours. BNP (last 3 results) No results for input(s): PROBNP in the last 8760 hours. HbA1C: No results for input(s): HGBA1C in the last 72 hours. CBG: Recent Labs  Lab 09/17/18 1156 09/17/18 1601 09/17/18 1952 09/18/18 0815 09/18/18 1303  GLUCAP 154* 111* 123* 230* 176*   Lipid Profile: No results for input(s): CHOL, HDL, LDLCALC, TRIG, CHOLHDL, LDLDIRECT  in the last 72 hours. Thyroid Function Tests: No results for input(s): TSH, T4TOTAL, FREET4, T3FREE, THYROIDAB in the last 72 hours. Anemia Panel: No results for input(s): VITAMINB12, FOLATE, FERRITIN, TIBC, IRON, RETICCTPCT in the last 72 hours. Sepsis Labs: No results for input(s): PROCALCITON, LATICACIDVEN in the last 168 hours.  No results found for this or any previous visit (from the past 240 hour(s)).  Radiology Studies: No results found.      Scheduled Meds:  sodium chloride   Intravenous Once   amiodarone  200 mg Oral Daily   Chlorhexidine Gluconate Cloth  6 each Topical Daily   feeding supplement (GLUCERNA SHAKE)  237 mL Oral Q4H   feeding supplement (PRO-STAT SUGAR FREE 64)  30 mL Oral TID   insulin aspart  0-5 Units Subcutaneous QHS   insulin aspart  0-9 Units Subcutaneous TID WC   mouth rinse  15 mL Mouth Rinse BID   montelukast  10 mg Oral QHS   pantoprazole  40 mg Oral Daily   sodium chloride flush  10-40 mL Intracatheter Q12H   Continuous Infusions:  albumin human 25 g (09/18/18 0855)     LOS: 11 days    Time spent: 40 minutes    Irine Seal, MD [facility]  If 7PM-7AM, please contact night-coverage [email or web address] 09/18/2018, 1:10 PM

## 2018-09-18 NOTE — Significant Event (Signed)
Rapid Response Event Note  Overview: Time Called: 2050 Arrival Time: 2053 Event Type: MEWS, Other (Comment)  Initial Focused Assessment:  Called to room d/t elevated MEWS score and tachypnea.  Stacy Shelton is well known to me from previous admissions and her stay on ICU/SD unit.  Her past medical history is:  metastatic breast cancer, diabetes mellitus type 2, asthma  Admitted to the hospital after altered mental status. She was found to be hypoglycemic with blood glucose 19. She was started on broad-spectrum antibiotic for infectious etiology. Also at that time patient found to be severely anemic and thrombocytopenic. T  Patient initially was comfort measures however per oncology, mother and sister stated they never made decision to transfer her care to comfort measures and as such comfort orders were rescinded by oncology and patient now reverted back to a full CODE STATUS and patient wanting aggressive care at this time.  On my arrival at bedside, pt is A/Ox4, at first stated that she was at [facility], but had a smile on her face like she was joking and then stated she was at [facility].  Pt able to talk in full sentences, denies any shortness of breath.  Appears better than when I remember her on admission.  Lungs clear but diminished in the bases, which is her baseline.  BP 124/60, HR 115, T-99.6, RR-24-28 depending if patient is talking or not.  Sats 97 to 99% on 2L-[place]  Reviewing patient's chart HR has been mostly 90-120s NSR / ST.  RR has been 18-24.  Due to the tachycardia, the primary nurse asked about tele, however, pt stated that she does not want telemetry.  After a few minutes did notice that patient has some situational confusion and stated "I need to leave this place" twice.  CBG was normal, noted that head CT had already been done.  Pt has Ativan ordered, primary nurse gave 0.5mg  of Ativan IV. Respirations down to 20 to 24, maintaining sats at 98% on 2L-[place].  No  distress noted.    Interventions:  Rapid Response Assessment, Advised to notify NP to see if they see a need for any further orders.  Plan of Care (if not transferred):  Continue to monitor patient, per unit routine and protocol.  If any changes in condition please re-notify rapid response @ [phone].    Jacqulyn Ducking ICU/SD RN4 / Care Coordinator / Rapid Response Nurse Rapid Response Number:  [phone] ICU Charge Nurse Number:  [phone]

## 2018-09-18 NOTE — Progress Notes (Addendum)
Stacy Shelton   DOB:[birth year]   LN#:[id number]   I have seen her and agreed with following documentation ASSESSMENT & PLAN:  Metastatic breast cancer ([place]) She has significant complication from each cycle of treatment despite dose adjustment The patient is unlikely going to improve to the point of being strong enough to receive further treatment.  Both the patient and family members are aware about the plan for no further chemotherapy and future plan will be to continue on supportive measures only Her intermittent confusion along with progressive pancytopenia are indicative of disease progression I do not recommend imaging study as it will not change management plan and the imaging studies might hurt the patient  Intermittent altered mental status Likely due to metabolic encephalopathy versus disease progression in her brain Today, she seems alert and able to make medical decision I do not recommend further imaging study as it would not change plan of care  Recurrent infection, resolved She had numerous UTI, E. Coli Completed a course ofampicillin IV  Acquired pancytopenia We had numerous discussion about the role of transfusion The patient is transfusion refractory The patient was educated about the rationale behind stopping transfusion support but she wants to continue transfusion regardless Platelet count is 10,000 today and she does not have any active bleeding -no transfusion indicated Hemoglobin stable at 9.4 -no transfusion indicated Recommend transfusion of 1 unit of packed red blood cells for hemoglobin less than 8 and transfuse 1 unit of platelets if platelet count is less than 10,000 I do not recommend prophylactic G-CSF support in the absence of sepsis  Insulin dependent diabetes Continue insulin sliding scale She is noted to have recurrent hypoglycemia due to poor oral intake  Decubitus ulcers She has significant pain secondary to multiple new decubitus ulcers Wound  care is consulted Per nursing documentation, the patient has repeatedly refused wound care assessment  Malnutrition of moderate degree She has significant malnutrition We have discussed the importance of regular nutrition over the last 6 months but yet the patient continues to refuse regular intake I did not identify any barriers for her from eating I do not recommend feeding tube placementor total parenteral nutritional support The patient has no swallowing difficulties and denies nausea The cause of her malnutrition is patient's refusal to eat in majority of the cases She is currently prescribed Glucerna every 4 hours  Physical debility, critical myopathy She has not participated with physical therapy for over 1 month PT/OT has been consulted I reinforced the importance of getting physical therapy and Occupational Therapy in order to get better 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 I suggested Psychiatry consult. Patient declined  Code Status Goals of care Discharge planning I had numerous goals of care discussion with the patient and family multiple times. The patient is intermittently lucid The patient wanted full code and aggressive care She is aware that this decision would preclude going home as she is currently transfusion dependent  All questions were answered. Sister by bedside    Mikey Bussing, NP 09/18/2018 10:15 AM Shannan Garfinkel Alvy Bimler Subjective:  The patient is resting quietly this morning but awakens easily She denies bleeding and pain Breakfast tray is at the  bedside and she has not yet eaten; last meal at midnight Nursing notes have been reviewed  and they have documented the patient is refusing a bath despite receiving IV pain medication prior to her bath Nursing attempted dressing changes and the patient stopped them before they were able to finish stating that she wishes to be left alone  Objective:  Vitals:   09/17/18 2147 09/18/18 0110  BP:  (!) 107/58  Pulse:  88  Resp:  18  Temp: 99.5 F (37.5 C) 98.7 F (37.1 C)  SpO2:  100%     Intake/Output Summary (Last 24 hours) at 09/18/2018 1015 Last data filed at 09/17/2018 1800 Gross per 24 hour  Intake 398.83 ml  Output 300 ml  Net 98.83 ml    GENERAL:alert, no distress and comfortable SKIN: Noted significant bruising NEURO: alert & oriented x 3 with fluent speech,    Labs:  Recent Labs    05/06/18 0430  09/09/18 0525 09/10/18 0511  09/14/18 0520 09/15/18 0500 09/18/18 0258  NA 138   < > 130* 130*   < > 136 137 133*  K 3.6   < > 3.0* 3.5   < > 3.8 3.5 3.4*  CL 99   < > 98 99   < > 100 99 97*  CO2 32   < > 24 24   < > 29 29 30   GLUCOSE 138*   < > 250* 211*   < > 102* 57* 194*  BUN 14   < > 12 11   < > 13 14 12   CREATININE 0.54   < > 0.43* 0.34*   < > <0.30* <0.30* <0.30*  CALCIUM 7.8*   < > 7.3* 7.5*   < > 8.3* 8.1* 7.4*  GFRNONAA >60   < > >60 >60   < > NOT CALCULATED NOT CALCULATED NOT CALCULATED  GFRAA >60   < > >60 >60   < > NOT CALCULATED NOT CALCULATED NOT CALCULATED  PROT 5.4*   < > 5.3* 5.3*  --   --  5.5*  --   ALBUMIN 1.7*   < > 1.6* 1.4*  --   --  1.5* 1.4*  AST 23   < > 13* 24  --   --  11*  --   ALT 14   < > 14 25  --   --  15  --   ALKPHOS 372*   < > 183* 353*  --   --  211*  --   BILITOT 2.0*   < > 2.2* 1.8*  --   --  1.7*  --   BILIDIR 1.0*  --   --   --   --   --   --   --   IBILI 1.0*  --   --   --   --   --   --   --    < > = values in this interval not displayed.    Studies:  Ct Head Wo Contrast  Result Date: 09/11/2018 CLINICAL DATA:  Altered level of consciousness. Metastatic breast cancer. EXAM: CT HEAD WITHOUT CONTRAST TECHNIQUE:  Contiguous axial images were obtained from the base of the skull through the vertex without intravenous contrast. COMPARISON:  CT head 04/08/2018, MRI head 07/17/2018 FINDINGS: Brain: Previously noted edema in the right occipital lobe has resolved. No new area of edema or hemorrhage. Mild atrophy appears to have progressed. Mild patchy white matter hypodensity also has progressed.  Question brain radiation. Vascular: Negative for hyperdense vessel Skull: Negative Sinuses/Orbits: Prior sinus surgery. Mucoperiosteal thickening in the maxillary sinus bilaterally. Negative orbit. Other: None IMPRESSION: 1. Interval resolution of edema in the right occipital lobe related to metastatic disease on prior studies. Current study was done without intravenous contrast and is not adequate to evaluate for residual or recurrent metastatic disease. 2. Progression of atrophy and white matter changes since the prior study. Question related to brain radiation. Electronically Signed   By: Franchot Gallo M.D.   On: 09/11/2018 12:04   Dg Chest Portable 1 View  Result Date: 08/30/2018 CLINICAL DATA:  58 year old female with weakness. History of metastatic breast cancer. EXAM: PORTABLE CHEST 1 VIEW COMPARISON:  Chest radiograph dated 06/22/2018 and CT dated 06/11/2018 FINDINGS: There is shallow inspiration with bibasilar atelectasis. A 4.4 cm rounded opacity in the right upper lobe corresponds to density seen on the prior CT and may represent metastatic disease or primary lung cancer. Additional smaller 14 mm nodular density noted in the right upper lobe more laterally. There is probable small bilateral pleural effusions. No pneumothorax. There is mild cardiomegaly. Atherosclerotic calcification of aortic arch. Right sided Port-A-Cath with tip in the region of the cavoatrial junction. No acute osseous pathology. IMPRESSION: 1. Small bilateral pleural effusions and bibasilar atelectasis or infiltrate. 2. Right upper lobe rounded opacity  corresponding to the density seen on the prior CT. An additional 14 mm right upper lobe pulmonary nodule noted. Electronically Signed   By: Anner Crete M.D.   On: 09/17/2018 19:07   Vas [place] Upper Extremity Venous Duplex  Result Date: 09/11/2018 UPPER VENOUS STUDY  Indications: Pain, Swelling, and Severe bruising Limitations: Unable to position the patient for optimal imaging. Comparison Study: No previous study available for comparison Performing Technologist: Toma Copier RVS  Examination Guidelines: A complete evaluation includes B-mode imaging, spectral Doppler, color Doppler, and power Doppler as needed of all accessible portions of each vessel. Bilateral testing is considered an integral part of a complete examination. Limited examinations for reoccurring indications may be performed as noted.  Right Findings: +----------+------------+---------+-----------+----------+--------------+ RIGHT     CompressiblePhasicitySpontaneousProperties   Summary     +----------+------------+---------+-----------+----------+--------------+ IJV                                                 Not visualized +----------+------------+---------+-----------+----------+--------------+ Subclavian                                          Not visualized +----------+------------+---------+-----------+----------+--------------+ Unable to Visulize the IJV or subclavian due to position of the patient and neck  Left Findings: +----------+------------+---------+-----------+----------+--------------------+ LEFT      CompressiblePhasicitySpontaneousProperties      Summary        +----------+------------+---------+-----------+----------+--------------------+ IJV           Full       Yes       Yes                  Difficult to  visualize       +----------+------------+---------+-----------+----------+--------------------+ Subclavian                Yes       Yes                  Difficult to                                                              visualize       +----------+------------+---------+-----------+----------+--------------------+ Axillary      Full                                                       +----------+------------+---------+-----------+----------+--------------------+ Brachial      Full       Yes       Yes                                   +----------+------------+---------+-----------+----------+--------------------+ Radial        Full                                   Difficult to image  +----------+------------+---------+-----------+----------+--------------------+ Ulnar         Full                                   Difficult to image  +----------+------------+---------+-----------+----------+--------------------+ Cephalic      Full                                                       +----------+------------+---------+-----------+----------+--------------------+ Basilic       Full                                                       +----------+------------+---------+-----------+----------+--------------------+  Summary:  Right: Unable to visualize the IJV and subclavian due to position of the patient and neck.  Left: This was a limited study. There is no obvious evidence of DVT. However limited visualization due to sub optimal postioning of the arm due to pain.  *See table(s) above for measurements and observations.  Diagnosing physician: Curt Jews MD Electronically signed by Curt Jews MD on 09/11/2018 at 7:12:31 AM.    Final

## 2018-09-18 NOTE — Progress Notes (Signed)
Physical Therapy Treatment Patient Details Name: Stacy Shelton MRN: [id number] DOB: 1960-04-27 Today's Date: 09/18/2018    History of Present Illness Pt is a 58 year old woman undergoing chemotherapy for metastatic breast cancer who was admitted 09/14/2018 with AMS,sepsis due to UTI and septic shock, hypoglycemia, anemia, thrombocytopenia, and multiple decubitus ulcers. PMH: DM, asthma. Pt has had 5 admissions in 6 months.    PT Comments    Pt remained unwilling to participate in mobility secondary to pain. Session spent educating patient on benefits of mobilizing including rolling in bed to adjust/relieve pressure from her bottom. Pt educated on benefits of chair position of hospital bed to improve her activity tolerance to maintaining upright position against gravity, and on goal to attempt this 1x today with RN staff. Encouraged to attempt at lunch in order to improve digestion as well. Assessed pt's ability to move ankles and toes and encouraged to perform ankle pumps with 3-5 sec holds to facilitate muscle activation. Pt perform ~ 4 reps and unable to sustain 3 sec holds due to weakness. PT will follow for additional session to encourage mobility and progress as pt is ale. If pt remains non-participatory in mobilizing in future sessions will likely sign off to RN staff. Acute PT will follow at this time.   Follow Up Recommendations  Other (comment)(TBD - if pt can benefit at point of dc)     Equipment Recommendations  None recommended by PT    Recommendations for Other Services       Precautions / Restrictions Precautions Precautions: Fall    Mobility  Bed Mobility               General bed mobility comments: pt declining any positioning or mobility at bed level 2* to pain  Transfers                 General transfer comment: deferred at pt request 2* pain  Ambulation/Gait                 Stairs             Wheelchair Mobility    Modified Rankin  (Stroke Patients Only)       Balance                   Cognition Arousal/Alertness: Awake/alert Behavior During Therapy: Flat affect Overall Cognitive Status: Within Functional Limits for tasks assessed                 Exercises Other Exercises Other Exercises: pt performed bil ankle pumps supine with minimal ROM and significant decrease in ankle dorsiflexion, unable to achieve neutral Other Exercises: pt wiggling toes to assess AROM as pt complained of toe pain on bil feet Other Exercises: Education provided on benefits of "chair position" to increase patient's acitivty tolerance, pt declined to perform during session, eduated on benefits for heart and to change position to reduce pressure on bottom by alternating/rotating positions. Other Exercises: OT provided education on exercises as well. See OT note.    General Comments        Pertinent Vitals/Pain Pain Assessment: 0-10 Pain Score: (pt reporting movement hurts too much to tolerate) Pain Location: generalized, and her buttock Pain Descriptors / Indicators: Sore Pain Intervention(s): Limited activity within patient's tolerance;Monitored during session;Premedicated before session           PT Goals (current goals can now be found in the care plan section) Acute Rehab PT Goals Patient Stated  Goal: did not state PT Goal Formulation: Patient unable to participate in goal setting Time For Goal Achievement: 09/26/18 Potential to Achieve Goals: Poor Progress towards PT goals: Not progressing toward goals - comment(pt not willing to participate in mobility)    Frequency    Min 2X/week(trial to see if pt can benefit)      PT Plan Current plan remains appropriate;Other (comment)(recommend follow up for 1 additional PT session, pt is not participatory in mobility however, if this does not cahnge suggest pt discharge to RN staff for further needs.)    Co-evaluation PT/OT/SLP Co-Evaluation/Treatment:  Yes Reason for Co-Treatment: Complexity of the patient's impairments (multi-system involvement) PT goals addressed during session: Mobility/safety with mobility;Strengthening/ROM;Other (comment)(repoistioning)        AM-PAC PT "6 Clicks" Mobility   Outcome Measure  Help needed turning from your back to your side while in a flat bed without using bedrails?: Total Help needed moving from lying on your back to sitting on the side of a flat bed without using bedrails?: Total Help needed moving to and from a bed to a chair (including a wheelchair)?: Total Help needed standing up from a chair using your arms (e.g., wheelchair or bedside chair)?: Total Help needed to walk in hospital room?: Total Help needed climbing 3-5 steps with a railing? : Total 6 Click Score: 6    End of Session   Activity Tolerance: Patient limited by pain Patient left: in bed;with call bell/phone within reach Nurse Communication: Other (comment);Need for lift equipment PT Visit Diagnosis: Muscle weakness (generalized) (M62.81)     Time: [id number] PT Time Calculation (min) (ACUTE ONLY): 14 min  Charges:  $Self Care/Home Management: 8-22                     Kipp Brood, PT, DPT, [facility] Physical Therapist with [facility]  09/18/2018 12:24 PM

## 2018-09-18 NOTE — Progress Notes (Signed)
Bath and dressing change again attempted this morning w/ pt. Pt given IV pain medication before bath. However, pt stops staff before dressings can be changed and refuses to finish. Pt stating she wants to be left alone. Pt denies needing anything else at this time.

## 2018-09-18 NOTE — Progress Notes (Signed)
Occupational Therapy Treatment Patient Details Name: Stacy Shelton MRN: [id number] DOB: 07/02/1960 Today's Date: 09/18/2018    History of present illness Pt is a 58 year old woman undergoing chemotherapy for metastatic breast cancer who was admitted 08/30/2018 with AMS,sepsis due to UTI and septic shock, hypoglycemia, anemia, thrombocytopenia, and multiple decubitus ulcers. PMH: DM, asthma. Pt has had 5 admissions in 6 months.   OT comments  Pt seen at bed level for OT session. Discussed positioning, rolling to side for pressure relief and positioning bed into chair position. Pt declining any type of bed mobility or positioning. Encouraged pt to try automatic chair position on bed controls with nurse and/or therapy later today or tomorrow. Discussed benefits of movement and positioning including be in a more upright chair position. Pt did complete a few ROM exercises e/w/h LUE, only able to wiggle digits on R hand this session. Pt noted to be tearful at times, verbalized concern that she is letting people down. Therapist provided reassurance. Will continue to trial OT with pt but if she continues to decline  bed level movement or positioning may need to consider signing off and deferring positioning needs to nursing. Will continue to follow for now.    Follow Up Recommendations  SNF;Supervision/Assistance - 24 hour( unless family can provide 24 hour total care)    Equipment Recommendations  Other (comment)(TBD)    Recommendations for Other Services      Precautions / Restrictions Precautions Precautions: Fall       Mobility Bed Mobility               General bed mobility comments: pt declining any positioning or mobility at bed level 2* to pain  Transfers                 General transfer comment: deferred at pt request 2* pain    Balance                                           ADL either performed or assessed with clinical judgement   ADL Overall  ADL's : Needs assistance/impaired                                       General ADL Comments: Pt reports she is still able to bring her L hand to mouth to feed herself. Otherwise, total care for ADLs. Pain a limiting factor, per chart review and discussion with nurse pt will at times refuses bath or any attempts at positioning d/t pain.     Vision       Perception     Praxis      Cognition Arousal/Alertness: Awake/alert Behavior During Therapy: Flat affect Overall Cognitive Status: Within Functional Limits for tasks assessed                                 General Comments: tearful at times. "I feel like I'm letting you guys down." Reassured pt that OT/PT were there to support her goals and she was in no way letting anyone down.         Exercises Other Exercises Other Exercises: e/w/h ROM LUE and ROM R digits in supine. A few reps to toleration. Discussed benefit  of ROM exercises any time she felt up to doing them Other Exercises: pt wiggling toes to assess AROM as pt complained of toe pain on bil feet Other Exercises: Education provided on benefits of "chair position" to increase patient's acitivty tolerance, pt declined to perform during session, eduated on benefits for heart and to change position to reduce pressure on bottom by alternating/rotating positions. Other Exercises: OT provided education on exercises as well. See OT note.   Shoulder Instructions       General Comments      Pertinent Vitals/ Pain       Pain Assessment: Faces Pain Score: (pt reporting movement hurts too much to tolerate) Faces Pain Scale: Hurts whole lot Pain Location: generalized, and her buttock Pain Descriptors / Indicators: Sore Pain Intervention(s): Limited activity within patient's tolerance  Home Living                                          Prior Functioning/Environment              Frequency  Min 1X/week        Progress  Toward Goals  OT Goals(current goals can now be found in the care plan section)  Progress towards OT goals: Not progressing toward goals - comment  Acute Rehab OT Goals Patient Stated Goal: did not state OT Goal Formulation: Patient unable to participate in goal setting Time For Goal Achievement: 09/26/18 Potential to Achieve Goals: Fair ADL Goals Pt Will Perform Eating: with mod assist;bed level Pt Will Perform Grooming: with mod assist;bed level Pt/caregiver will Perform Home Exercise Program: Increased ROM;Increased strength;Both right and left upper extremity;With minimal assist Additional ADL Goal #1: Pt/ caregiver will be knowlegeable in positioning to minimize risk of skin breakdown and decrease UE edema. Additional ADL Goal #2: Pt will perform rolling with moderate assistance for linen change, pressure relief and bed pan use. Additional ADL Goal #3: Pt will complete formal cognitive assessment.  Plan Discharge plan remains appropriate    Co-evaluation    PT/OT/SLP Co-Evaluation/Treatment: Yes Reason for Co-Treatment: Complexity of the patient's impairments (multi-system involvement) PT goals addressed during session: Mobility/safety with mobility;Strengthening/ROM;Other (comment)(repoistioning) OT goals addressed during session: ADL's and self-care;Strengthening/ROM      AM-PAC OT "6 Clicks" Daily Activity     Outcome Measure   Help from another person eating meals?: A Lot Help from another person taking care of personal grooming?: Total Help from another person toileting, which includes using toliet, bedpan, or urinal?: Total Help from another person bathing (including washing, rinsing, drying)?: Total Help from another person to put on and taking off regular upper body clothing?: Total Help from another person to put on and taking off regular lower body clothing?: Total 6 Click Score: 7    End of Session    OT Visit Diagnosis: Pain;Muscle weakness (generalized)  (M62.81)   Activity Tolerance Patient limited by pain   Patient Left in bed;with call bell/phone within reach   Nurse Communication Other (comment)( minimal ability to participate in therapy)        Time: 1051-1105 OT Time Calculation (min): 14 min  Charges: OT General Charges $OT Visit: 1 Visit  Tyrone Schimke, OT Acute Rehabilitation Services Pager: [phone] Office: [phone]    Hortencia Pilar 09/18/2018, 12:44 PM

## 2018-09-19 ENCOUNTER — Inpatient Hospital Stay (HOSPITAL_COMMUNITY): Payer: BC Managed Care – PPO

## 2018-09-19 ENCOUNTER — Telehealth: Payer: Self-pay | Admitting: Oncology

## 2018-09-19 ENCOUNTER — Telehealth: Payer: Self-pay | Admitting: *Deleted

## 2018-09-19 ENCOUNTER — Other Ambulatory Visit: Payer: Self-pay | Admitting: Hematology and Oncology

## 2018-09-19 DIAGNOSIS — E43 Unspecified severe protein-calorie malnutrition: Secondary | ICD-10-CM | POA: Diagnosis present

## 2018-09-19 LAB — CBC WITH DIFFERENTIAL/PLATELET
Abs Immature Granulocytes: 0.02 10*3/uL (ref 0.00–0.07)
Basophils Absolute: 0.1 10*3/uL (ref 0.0–0.1)
Basophils Relative: 7 %
Eosinophils Absolute: 0 10*3/uL (ref 0.0–0.5)
Eosinophils Relative: 0 %
HCT: 25.5 % — ABNORMAL LOW (ref 36.0–46.0)
Hemoglobin: 8.1 g/dL — ABNORMAL LOW (ref 12.0–15.0)
Immature Granulocytes: 2 %
Lymphocytes Relative: 33 %
Lymphs Abs: 0.3 10*3/uL — ABNORMAL LOW (ref 0.7–4.0)
MCH: 29.6 pg (ref 26.0–34.0)
MCHC: 31.8 g/dL (ref 30.0–36.0)
MCV: 93.1 fL (ref 80.0–100.0)
Monocytes Absolute: 0.2 10*3/uL (ref 0.1–1.0)
Monocytes Relative: 17 %
Neutro Abs: 0.4 10*3/uL — ABNORMAL LOW (ref 1.7–7.7)
Neutrophils Relative %: 41 %
Platelets: <5 10*3/uL — CL (ref 150–400)
RBC: 2.74 MIL/uL — ABNORMAL LOW (ref 3.87–5.11)
RDW: 16.4 % — ABNORMAL HIGH (ref 11.5–15.5)
WBC: 1 10*3/uL — CL (ref 4.0–10.5)
nRBC: 0 % (ref 0.0–0.2)

## 2018-09-19 LAB — RENAL FUNCTION PANEL
Albumin: 2.6 g/dL — ABNORMAL LOW (ref 3.5–5.0)
Anion gap: 9 (ref 5–15)
BUN: 10 mg/dL (ref 6–20)
CO2: 28 mmol/L (ref 22–32)
Calcium: 8.1 mg/dL — ABNORMAL LOW (ref 8.9–10.3)
Chloride: 98 mmol/L (ref 98–111)
Creatinine, Ser: <0.3 mg/dL — ABNORMAL LOW (ref 0.44–1.00)
Glucose, Bld: 138 mg/dL — ABNORMAL HIGH (ref 70–99)
Phosphorus: 3.3 mg/dL (ref 2.5–4.6)
Potassium: 3.6 mmol/L (ref 3.5–5.1)
Sodium: 135 mmol/L (ref 135–145)

## 2018-09-19 LAB — GLUCOSE, CAPILLARY
Glucose-Capillary: 156 mg/dL — ABNORMAL HIGH (ref 70–99)
Glucose-Capillary: 155 mg/dL — ABNORMAL HIGH (ref 70–99)

## 2018-09-19 MED ORDER — HALOPERIDOL LACTATE 2 MG/ML PO CONC
0.5000 mg | ORAL | Status: DC | PRN
  Filled 2018-09-19: qty 0.3

## 2018-09-19 MED ORDER — NYSTATIN 100000 UNIT/GM EX POWD
Freq: Three times a day (TID) | CUTANEOUS | Status: DC | PRN
  Filled 2018-09-19: qty 15

## 2018-09-19 MED ORDER — DIPHENHYDRAMINE HCL 25 MG PO CAPS
25.0000 mg | ORAL_CAPSULE | Freq: Once | ORAL | Status: AC
  Administered 2018-09-19: 25 mg via ORAL
  Filled 2018-09-19: qty 1

## 2018-09-19 MED ORDER — HALOPERIDOL 0.5 MG PO TABS
0.5000 mg | ORAL_TABLET | ORAL | Status: DC | PRN
  Filled 2018-09-19: qty 1

## 2018-09-19 MED ORDER — FUROSEMIDE 10 MG/ML IJ SOLN
20.0000 mg | Freq: Once | INTRAMUSCULAR | Status: AC
  Administered 2018-09-19: 20 mg via INTRAVENOUS
  Filled 2018-09-19: qty 2

## 2018-09-19 MED ORDER — BISACODYL 10 MG RE SUPP: 10.0000 mg | Freq: Every day | RECTAL | Status: DC | PRN

## 2018-09-19 MED ORDER — ONDANSETRON HCL 4 MG/2ML IJ SOLN: 4.0000 mg | Freq: Four times a day (QID) | INTRAMUSCULAR | Status: DC | PRN

## 2018-09-19 MED ORDER — GLYCOPYRROLATE 1 MG PO TABS
1.0000 mg | ORAL_TABLET | ORAL | Status: DC | PRN
  Filled 2018-09-19: qty 1

## 2018-09-19 MED ORDER — POLYVINYL ALCOHOL 1.4 % OP SOLN
1.0000 [drp] | Freq: Four times a day (QID) | OPHTHALMIC | Status: DC | PRN
  Filled 2018-09-19: qty 15

## 2018-09-19 MED ORDER — GLYCOPYRROLATE 0.2 MG/ML IJ SOLN
0.2000 mg | INTRAMUSCULAR | Status: DC | PRN
  Filled 2018-09-19: qty 1

## 2018-09-19 MED ORDER — OXYBUTYNIN CHLORIDE 5 MG PO TABS: 2.5000 mg | ORAL_TABLET | Freq: Four times a day (QID) | ORAL | Status: DC | PRN

## 2018-09-19 MED ORDER — MAGIC MOUTHWASH W/LIDOCAINE
15.0000 mL | Freq: Four times a day (QID) | ORAL | Status: DC | PRN
  Filled 2018-09-19: qty 15

## 2018-09-19 MED ORDER — ACETAMINOPHEN 325 MG PO TABS: 650.0000 mg | ORAL_TABLET | Freq: Four times a day (QID) | ORAL | Status: DC | PRN

## 2018-09-19 MED ORDER — TRAZODONE HCL 50 MG PO TABS
50.0000 mg | ORAL_TABLET | Freq: Once | ORAL | Status: AC
  Administered 2018-09-19: 50 mg via ORAL
  Filled 2018-09-19: qty 1

## 2018-09-19 MED ORDER — ONDANSETRON 4 MG PO TBDP: 4.0000 mg | ORAL_TABLET | Freq: Four times a day (QID) | ORAL | Status: DC | PRN

## 2018-09-19 MED ORDER — DIPHENHYDRAMINE HCL 50 MG/ML IJ SOLN: 12.5000 mg | INTRAMUSCULAR | Status: DC | PRN

## 2018-09-19 MED ORDER — ACETAMINOPHEN 325 MG PO TABS
650.0000 mg | ORAL_TABLET | Freq: Once | ORAL | Status: AC
  Administered 2018-09-19: 650 mg via ORAL
  Filled 2018-09-19: qty 2

## 2018-09-19 MED ORDER — ALBUMIN HUMAN 25 % IV SOLN
25.0000 g | Freq: Four times a day (QID) | INTRAVENOUS | Status: DC
  Administered 2018-09-19: 13:00:00 25 g via INTRAVENOUS
  Filled 2018-09-19 (×3): qty 100

## 2018-09-19 MED ORDER — ACETAMINOPHEN 650 MG RE SUPP: 650.0000 mg | Freq: Four times a day (QID) | RECTAL | Status: DC | PRN

## 2018-09-19 MED ORDER — SODIUM CHLORIDE 0.9 % IV SOLN
12.5000 mg | Freq: Four times a day (QID) | INTRAVENOUS | Status: DC | PRN
  Filled 2018-09-19: qty 0.5

## 2018-09-19 MED ORDER — BIOTENE DRY MOUTH MT LIQD: 15.0000 mL | OROMUCOSAL | Status: DC | PRN

## 2018-09-19 MED ORDER — ENSURE ENLIVE PO LIQD
237.0000 mL | Freq: Two times a day (BID) | ORAL | Status: DC
  Administered 2018-09-19 – 2018-09-20 (×2): 237 mL via ORAL

## 2018-09-19 MED ORDER — JUVEN PO PACK
1.0000 | PACK | Freq: Two times a day (BID) | ORAL | Status: DC
  Filled 2018-09-19 (×4): qty 1

## 2018-09-19 MED ORDER — HALOPERIDOL LACTATE 5 MG/ML IJ SOLN: 0.5000 mg | INTRAMUSCULAR | Status: DC | PRN

## 2018-09-19 MED ORDER — SODIUM CHLORIDE 0.9% IV SOLUTION
Freq: Once | INTRAVENOUS | Status: AC
  Administered 2018-09-19: 09:00:00 via INTRAVENOUS

## 2018-09-19 NOTE — Progress Notes (Signed)
Reached out to Dr. Alvy Bimler in regards to patient's temp of 100.4, HR of 119, and respirations of 26. Patient was given pre-meds of 650 of Tylenol and 25mg  of Benadryl at 8:06 am. Got permission to proceed with platelet transfusion. Checked vitals again prior to starting transfusion. Temp 98.5, pulse 114, resp 21, and bp 126/48.

## 2018-09-19 NOTE — Telephone Encounter (Signed)
Telephone call received from [place], patients sister. [name] wanted to understand the need for platelets. Discussed patients inability to retain, produce platelets and concern for internal bleeding. [name] states patient is very uncomfortable and constantly in pain while in the hospital. Discussed her sisters will to live but refusal to aid in her progress. She wants to discuss other solutions and how she can support her sisters desire to continue living. She explains patient is having intermittent lucid moments. They are having difficulty determining what the patient's actual desires for her care. Sister would like to discuss this further with Dr. Alvy Bimler and determine the next steps in her sisters care. Suggested patient and family might benefit from visit from the [facility]. Sister agreed. Referral will be made. Dr. Alvy Bimler aware.

## 2018-09-19 NOTE — Progress Notes (Addendum)
Stacy Shelton   DOB:1960/05/09   [id number]   I have seen her and agreed with following documentation  ASSESSMENT & PLAN:  Metastatic breast cancer ([place]) She has significant complication from each cycle of treatment despite dose adjustment The patient is unlikely going to improve to the point of being strong enough to receive further treatment.  Both the patient and family members are aware about the plan for no further chemotherapy and future plan will be to continue on supportive measures only Her intermittent confusion along with progressive pancytopenia are indicative of disease progression I do not recommend imaging study as it will not change management plan and the imaging studies might hurt the patient  Intermittent altered mental status Likely due to metabolic encephalopathy versus disease progression in her brain Today, she seems alert and able to make medical decision I do not recommend further imaging study as it would not change plan of care  Recurrent infection, resolved She had numerous UTI, E. Coli Completed a course ofampicillin IV  Recurrent low-grade fever, neutropenia I explained to the patient's family that she likely will be at risk of recurrent infection/sepsis given her immunocompromise state After extensive discussion, they are in agreement not to pursue further work-up for recurrent fever and focus on comfort only We will not start her on antibiotics with fever  Acquired pancytopenia We had numerous discussion about the role of transfusion The patient is transfusion refractory She has received 1 unit of platelet today After extensive discussion with her sister and mother, they are in agreement to stop transfusion support as well as blood count monitoring I do not recommend prophylactic G-CSF support in the absence of sepsis  Insulin dependent diabetes Due to plan to transition her back to comfort care, we will stop blood glucose monitoring and  insulin I will change her diet to regular  Decubitus ulcers She has significant pain secondary to multiple new decubitus ulcers Wound care is consulted Per nursing documentation, the patient has repeatedly refused wound care assessment  Malnutrition of moderate degree She is currently prescribed Glucerna every 4 hours She may eat for pleasure  Physical debility, critical myopathy She has not participated with physical therapy for over 1 month PT/OT has been consulted With plan to transition her to comfort, we will discontinue PT/OT  Pain and anxiety She has morphine sulfate and as needed Dilaudid We will also give her as needed Ativan as needed for anxiety  Code Status Goals of care Discharge planning I had numerous goals of care discussion with the patient and family multiple times. The patient is intermittently lucid I spent another hour with her sister and mother today given her significant decline in performance status Ultimately, they are in agreement to transition her back to palliative care/comfort mode I will change her CODE STATUS to DNR I anticipate hospital death.  Likely in less than 3 days without further transfusion support  All questions were answered.     Mikey Bussing, NP 09/19/2018 11:14 AM  [name], MD  Subjective:  The patient had a fever this morning up to 100.4 She had Tylenol and Benadryl as a premed to her platelets Fever occurred prior to platelet administration Prior to entering the room, the patient was heard to be yelling out "mama" The patient reoriented quickly when I entered the room According to her mother, she continues to have intermittent confusion Denies pain Denies bleeding Nursing reports that she refused her breakfast tray Patient was seen by PT yesterday and  documented the patient remained unwilling to participate in mobility secondary to pain OT saw the patient yesterday and discussed positioning with her  Objective:   Vitals:   09/19/18 0847 09/19/18 0952  BP: (!) 130/55 (!) 126/48  Pulse: (!) 119 (!) 114  Resp: (!) 26 (!) 21  Temp: (!) 100.4 F (38 C) 98.5 F (36.9 C)  SpO2: 96% 99%     Intake/Output Summary (Last 24 hours) at 09/19/2018 1114 Last data filed at 09/19/2018 0600 Gross per 24 hour  Intake 1029.76 ml  Output -  Net 1029.76 ml    GENERAL:alert, diaphoretic Respiratory: Clear to auscultation bilaterally Cardiovascular: Tachycardia, no murmurs SKIN: Noted significant bruising NEURO: alert & oriented x 3 with fluent speech, intermittently alert   Labs:  Recent Labs    05/06/18 0430  09/09/18 0525 09/10/18 0511  09/15/18 0500 09/18/18 0258 09/19/18 0547  NA 138   < > 130* 130*   < > 137 133* 135  K 3.6   < > 3.0* 3.5   < > 3.5 3.4* 3.6  CL 99   < > 98 99   < > 99 97* 98  CO2 32   < > 24 24   < > 29 30 28   GLUCOSE 138*   < > 250* 211*   < > 57* 194* 138*  BUN 14   < > 12 11   < > 14 12 10   CREATININE 0.54   < > 0.43* 0.34*   < > <0.30* <0.30* <0.30*  CALCIUM 7.8*   < > 7.3* 7.5*   < > 8.1* 7.4* 8.1*  GFRNONAA >60   < > >60 >60   < > NOT CALCULATED NOT CALCULATED NOT CALCULATED  GFRAA >60   < > >60 >60   < > NOT CALCULATED NOT CALCULATED NOT CALCULATED  PROT 5.4*   < > 5.3* 5.3*  --  5.5*  --   --   ALBUMIN 1.7*   < > 1.6* 1.4*  --  1.5* 1.4* 2.6*  AST 23   < > 13* 24  --  11*  --   --   ALT 14   < > 14 25  --  15  --   --   ALKPHOS 372*   < > 183* 353*  --  211*  --   --   BILITOT 2.0*   < > 2.2* 1.8*  --  1.7*  --   --   BILIDIR 1.0*  --   --   --   --   --   --   --   IBILI 1.0*  --   --   --   --   --   --   --    < > = values in this interval not displayed.    Studies:  Ct Head Wo Contrast  Result Date: 09/11/2018 CLINICAL DATA:  Altered level of consciousness. Metastatic breast cancer. EXAM: CT HEAD WITHOUT CONTRAST TECHNIQUE: Contiguous axial images were obtained from the base of the skull through the vertex without intravenous contrast. COMPARISON:  CT head  04/08/2018, MRI head 07/17/2018 FINDINGS: Brain: Previously noted edema in the right occipital lobe has resolved. No new area of edema or hemorrhage. Mild atrophy appears to have progressed. Mild patchy white matter hypodensity also has progressed. Question brain radiation. Vascular: Negative for hyperdense vessel Skull: Negative Sinuses/Orbits: Prior sinus surgery. Mucoperiosteal thickening in the maxillary sinus bilaterally. Negative orbit. Other: None IMPRESSION:  1. Interval resolution of edema in the right occipital lobe related to metastatic disease on prior studies. Current study was done without intravenous contrast and is not adequate to evaluate for residual or recurrent metastatic disease. 2. Progression of atrophy and white matter changes since the prior study. Question related to brain radiation. Electronically Signed   By: Franchot Gallo M.D.   On: 09/11/2018 12:04   Dg Chest Port 1 View  Result Date: 09/19/2018 CLINICAL DATA:  Pleural effusion. EXAM: PORTABLE CHEST 1 VIEW COMPARISON:  Radiograph of September 07, 2018.  CT scan of Jun 11, 2018. FINDINGS: Stable cardiomediastinal silhouette. Right internal jugular Port-A-Cath is unchanged in position. Stable right upper lobe density is noted concerning for possible malignancy or metastatic disease. Stable bibasilar subsegmental atelectasis is noted with probable small right pleural effusion. Bony thorax is unremarkable. IMPRESSION: Stable bibasilar subsegmental atelectasis is noted with small right pleural effusion. Stable right upper lobe density is noted concerning for possible malignancy or metastatic disease as described on prior CT scan. Electronically Signed   By: Marijo Conception M.D.   On: 09/19/2018 07:03   Dg Chest Portable 1 View  Result Date: 09/19/2018 CLINICAL DATA:  58 year old female with weakness. History of metastatic breast cancer. EXAM: PORTABLE CHEST 1 VIEW COMPARISON:  Chest radiograph dated 06/22/2018 and CT dated 06/11/2018  FINDINGS: There is shallow inspiration with bibasilar atelectasis. A 4.4 cm rounded opacity in the right upper lobe corresponds to density seen on the prior CT and may represent metastatic disease or primary lung cancer. Additional smaller 14 mm nodular density noted in the right upper lobe more laterally. There is probable small bilateral pleural effusions. No pneumothorax. There is mild cardiomegaly. Atherosclerotic calcification of aortic arch. Right sided Port-A-Cath with tip in the region of the cavoatrial junction. No acute osseous pathology. IMPRESSION: 1. Small bilateral pleural effusions and bibasilar atelectasis or infiltrate. 2. Right upper lobe rounded opacity corresponding to the density seen on the prior CT. An additional 14 mm right upper lobe pulmonary nodule noted. Electronically Signed   By: Anner Crete M.D.   On: 09/02/2018 19:07   Vas [place] Upper Extremity Venous Duplex  Result Date: 09/11/2018 UPPER VENOUS STUDY  Indications: Pain, Swelling, and Severe bruising Limitations: Unable to position the patient for optimal imaging. Comparison Study: No previous study available for comparison Performing Technologist: Toma Copier RVS  Examination Guidelines: A complete evaluation includes B-mode imaging, spectral Doppler, color Doppler, and power Doppler as needed of all accessible portions of each vessel. Bilateral testing is considered an integral part of a complete examination. Limited examinations for reoccurring indications may be performed as noted.  Right Findings: +----------+------------+---------+-----------+----------+--------------+ RIGHT     CompressiblePhasicitySpontaneousProperties   Summary     +----------+------------+---------+-----------+----------+--------------+ IJV                                                 Not visualized +----------+------------+---------+-----------+----------+--------------+ Subclavian                                           Not visualized +----------+------------+---------+-----------+----------+--------------+ Unable to Visulize the IJV or subclavian due to position of the patient and neck  Left Findings: +----------+------------+---------+-----------+----------+--------------------+ LEFT      CompressiblePhasicitySpontaneousProperties  Summary        +----------+------------+---------+-----------+----------+--------------------+ IJV           Full       Yes       Yes                  Difficult to                                                              visualize       +----------+------------+---------+-----------+----------+--------------------+ Subclavian               Yes       Yes                  Difficult to                                                              visualize       +----------+------------+---------+-----------+----------+--------------------+ Axillary      Full                                                       +----------+------------+---------+-----------+----------+--------------------+ Brachial      Full       Yes       Yes                                   +----------+------------+---------+-----------+----------+--------------------+ Radial        Full                                   Difficult to image  +----------+------------+---------+-----------+----------+--------------------+ Ulnar         Full                                   Difficult to image  +----------+------------+---------+-----------+----------+--------------------+ Cephalic      Full                                                       +----------+------------+---------+-----------+----------+--------------------+ Basilic       Full                                                       +----------+------------+---------+-----------+----------+--------------------+  Summary:  Right: Unable to visualize the IJV and subclavian due to position of  the patient and neck.  Left: This was a limited  study. There is no obvious evidence of DVT. However limited visualization due to sub optimal postioning of the arm due to pain.  *See table(s) above for measurements and observations.  Diagnosing physician: Curt Jews MD Electronically signed by Curt Jews MD on 09/11/2018 at 7:12:31 AM.    Final

## 2018-09-19 NOTE — Progress Notes (Signed)
Nutrition Follow-up  DOCUMENTATION CODES:   Severe malnutrition in context of chronic illness  INTERVENTION:  Discontinue Glucerna - pt would benefit from ONS with increased kcal and protein -Continue Prostat BID  -Liberalize diet -Ensure Enlive po TID, each supplement provides 350 kcal and 20 grams of protein -Magic cup TID with meals, each supplement provides 290 kcal and 9 grams of protein --1 packet Juven BID, each packet provides 95 calories, 2.5 grams of protein (collagen), and 9.8 grams of carbohydrate (3 grams sugar); also contains 7 grams of L-arginine and L-glutamine, 300 mg vitamin C, 15 mg vitamin E, 1.2 mcg vitamin B-12, 9.5 mg zinc, 200 mg calcium, and 1.5 g  Calcium Beta-hydroxy-Beta-methylbutyrate to support wound healing   NUTRITION DIAGNOSIS:   Severe Malnutrition related to chronic illness(metastatic breast cancer) as evidenced by percent weight loss, per patient/family report, energy intake < or equal to 75% for > or equal to 1 month(per sister, MD report of ongoing poor intake x 6 months).  New diagnosis   GOAL:   Patient will meet greater than or equal to 90% of their needs  Progressing -  poor meal intake continues; additional supplements ordered to assist with calorie and protein needs  MONITOR:   PO intake, Weight trends, Supplement acceptance, Diet advancement, Skin, Labs, I & O's  REASON FOR ASSESSMENT:   Consult Assessment of nutrition requirement/status, Calorie Count  ASSESSMENT:  RD working remotely.  58 year old Caucasian female with past medical history of metastatic breast cancer, diabetes mellitus type 2, asthma was brought to hospital after altered mental status. Admitted for acute encephalopathy.  8/11-8/13: Initiated 72 hour calorie count Estimated total intake: 1562 kcal (78% of minimum estimated needs)  75g protein (83% of minimum estimated needs)  Patient continues with poor meal intake with 0-25% of meals documented since 8/14;  recommend liberalized diet  Per chart review, oncology reports significant complication from each cycle of treatment despite dose adjustments; pt unlikely to improve to the point of being strong enough to receive further treatment. Patient and family are aware about the plan for no further chemotherapy; supportive measures only moving forward  Oncology does not recommend feeding tube placement or TPN. Oncologist noted that the importance of regular nutrition has been discussed with patient over the past 6 months; pt continues to refuse regular intake. Patient noted to have recurrent hypoglycemia due to poor oral intake.   Spoke with patients sister via phone this afternoon. Sister reports that she continues to encourage patient to eat and drink supplements and recalls patient having one Glucerna today. RD inquired about food preferences that would promote increased po at meal times; sister stated that the patient has "not liked any food for quite some time now" RD suggested adding Ensure due to the increased calorie and protein content offered in comparison to Glucerna and offered magic cup TID. Sister appreciative and agreeable to additional supplements and reports that patient typically likes chocolate.   Patient noted to report significant pain secondary to multiple new decubitus ulcers; wound care has been consulted. Patient receiving prostat BID; RD will order Juven - additional protein supplement to promote wound healing  Current wt 59.2 kg (130.2 lb) 3/16 -  74.6 kg (164 lb) Patient noted with 34 lb wt loss x 5 mo (20.7%; severe for time frame)   Patient meets criteria for severe malnutrition in the context of chronic illness given history of severe wt loss on reported ongoing poor po meeting less than 75% of needs for greater  than one month  CBGS 129-176 Diet Order:   Diet Order            Diet regular Room service appropriate? Yes; Fluid consistency: Thin  Diet effective now               EDUCATION NEEDS:   No education needs have been identified at this time  Skin:  Skin Assessment: Skin Integrity Issues: Skin Integrity Issues:: DTI, Stage I, Stage II, Unstageable DTI: left calf Stage I: left and right ear, left and right hip Stage II: back Unstageable: sacrum, left heel, right thigh  Last BM:  8/13  Height:   Ht Readings from Last 1 Encounters:  09/08/18 5\' 2"  (1.575 m)    Weight:   Wt Readings from Last 1 Encounters:  09/14/18 59.2 kg    Ideal Body Weight:  50 kg  BMI:  Body mass index is 23.87 kg/m.  Estimated Nutritional Needs:   Kcal:  2000-2200  Protein:  90-100g  Fluid:  2L/day   Lajuan Lines, RD, LDN [employer] Telephone [phone] After Hours/Weekend Pager: [phone]

## 2018-09-19 NOTE — Progress Notes (Signed)
PROGRESS NOTE    Stacy Shelton  VFI:[id number] DOB: [birth year] DOA: 09/16/2018 PCP: Robyne Peers, MD    Brief Narrative:  Patient is a 58 year old Caucasian female with past medical history of metastatic breast cancer, diabetes mellitus type 2, asthma was brought to hospital after altered mental status. Found to be hypoglycemic with blood glucose 19. She was started on broad-spectrum antibiotic for infectious etiology. Also patient found to be severely anemic and thrombocytopenic. Transfused PRBC and platelets. Patient was hypotensive and required brief Levophed infusion on 8/10, off on 8/11. Patient initially was comfort measures however per oncology, mother and sister stated they never made decision to transfer her care to comfort measures and as such comfort orders were rescinded by oncology and patient now reverted back to a full CODE STATUS and patient wanting aggressive care at this time.    Assessment & Plan:   Principal Problem:   Sepsis ([place]) Active Problems:   Thrombocytopenia ([place])   Uncontrolled diabetes mellitus with hyperglycemia (HCC)   Metastatic breast cancer (HCC)   Pancytopenia, acquired ([place])   Acute lower UTI   Anemia associated with chemotherapy   Decubitus ulcers   Acute encephalopathy   Metastatic cancer to brain [facility])   Palliative care encounter   Metastatic cancer ([place])   Comfort measures only status   Encounter for hospice care discussion  1 sepsis with septic shock secondary to enterococcus UTI Patient during the hospitalization met criteria for sepsis at time of admission with low-grade fever, hypotension, tachycardia, tachypnea, hypoxia and sore throat likely secondary to UTI.  Patient was on vasopressors of Levophed initially and weaned off on 09/09/2018.  Patient pancultured and urine cultures positive for enterococcus.  Patient was initially on vancomycin and subsequently transitioned to IV cefepime and has received a total of 7 days of  antibiotics.  Patient with low-grade temp this morning likely secondary to tumor fever. No further antibiotics needed at this time.  2.  Pancytopenia with thrombocytopenia Felt likely secondary to bone marrow involvement of metastatic breast cancer.  Patient has received a total of 6 units packed red blood cells as well as 6 units of platelets.  Patient to receive unit of platelets today as platelet count less than 5.  Patient's anemia and thrombocytopenia appear to be refractory to transfusions.  Was initially thought to have worsening due to antibiotics of Vanco and cefepime which have subsequently been discontinued with no significant improvement.  Patient currently wants aggressive care.  Platelet count today less than 5, WBC of 1, hemoglobin of 8.1.  A unit of platelets has been ordered for transfusion per oncology.  Oncology met with family again for now for goals of care discussion and decision has been made to transition to full comfort measures.  Per oncology.  3.  Uncontrolled diabetes mellitus with hyperglycemia Hemoglobin A1c of 6.7 on 06/11/2018.  CBG of 156 this morning.  Patient noted to have bouts of hypoglycemia secondary to poor oral intake.  Levemir decreased to 10 units daily nightly has been discontinued.  Continue sliding scale insulin.  Due to poor oral intake will liberalize diet.  Patient has been transitioned to full comfort measures.  CBGs to be discontinued per oncology.  4.  Multiple decubitus ulcers, POA Per nursing documentation patient with bilateral ear pressure injuries, stage I, POA, upper back pressure injury stage II, POA, sacral pressure injury unstageable, POA, left heel pressure injury, unstageable,POA Right thigh pressure injury, unstageable POA.  Per nursing patient refusing to be turned  yesterday.  Patient agreed with wound care changes done today however stated was in significant pain.  Advised nursing to premedicate prior to wound care changing.  Continue current  wound care if patient allows.  Follow.  5.  Deconditioning/debility Patient has not had physical therapy in at least 3 weeks.  Patient very deconditioned with myopathy of critical illness and poor oral intake.  Patient noted to be refusing initially during the hospitalization.  Patient is in agreement and motivated for physical therapy.  PT OT has been reordered.  Will likely need SNF on discharge.  Oncology met with family again today 09/19/2018 with goals of care discussion and decision is made to transition to full comfort measures.    6.  History of mild diastolic dysfunction 2D echo from May 2020 with a EF of 60 to 65%.  Patient currently euvolemic.  Patient noted to have some increased respiratory rate overnight and concern for possible volume overload.  Patient has received multiple transfusions.  We will give Lasix 20 mg IV x1.  Strict I's and O's.  Daily weights.   7.  Acute metabolic encephalopathy Patient with waxing and waning mental status felt multifactorial secondary to critical illness, UTI, poor oral intake, debility, mets to the brain.  Head CT on 09/11/2018- for any bleed or acute CVA.  Oncology following.  Palliative care following.  8.  Hypoalbuminemia, severe degree of malnutrition, severe deconditioning Albumin noted to be at 2.6 from 1.4 after given a days worth of IV albumin..  Continue current nutritional supplementation of pro-stat, Glucerna.  Patient encouraged to increase oral intake.  Patient with poor oral intake.  Will liberalize diet.  Patient was started on IV albumin for another 24 hours however this has been discontinued per oncology as patient is now being transitioned to full comfort measures.  9.  Hypokalemia Repleted.  10.  Low-grade temp Patient noted with a low-grade fever this morning of 100.4 and 100.2 last night.  Likely secondary to tumor burden.  Chest x-ray done with no acute infiltrates.  Patient with no urinary symptoms.  Hold off on antibiotics at  this time..  11.  Metastatic breast cancer Patient had goals of care meeting on 09/15/2018 in the presence of her family and at that time patient was somewhat confused.  Family requested DNR, comfort care and possible residential hospice.  Patient noted to have changed her mind and wanting full scope of treatment.  Goals of care meeting have been done with oncology and palliative care medicine on 09/16/2018.  Meeting again 09/17/2018 with family and patient wanting full scope of care at this time.  Per oncology.  Per oncology no further plans for chemotherapy and to continue supportive care at this time. Spoke with Dr. Patsey Berthold, oncology who had an hour-long goals of care meeting with family again today 09/19/2018 and decision has been made to transition to full comfort.  Oncology will change patient's CODE STATUS to DNR and adjust medications.   DVT prophylaxis: SCDs Code Status: DNR Family Communication: Updated patient.  No family at bedside. Disposition Plan: To be determined. Patient require an inpatient treatment and management as patient is transfusion dependent at this time and wanted to be full code with aggressive care.  Likely discharge when clinically stable and okay with oncology.   Consultants:   Oncology: Dr. Alvy Bimler  Palliative care: Florentina Jenny, PA 09/14/2018  Wound care  Procedures:   CT head 09/11/2018  Chest x-ray 09/08/2018  Upper extremity Dopplers 09/10/2018  Transfused  total of 6 units packed red blood cells  Transfused total of 7 units platelets  Antimicrobials:   IV ampicillin 09/10/2018>>>> 09/14/2018  IV cefepime 09/08/2018>>>> 09/10/2018  IV vancomycin 09/16/2018>>>> 09/10/2018   Subjective: Patient sleeping however easily arousable.  Still with minimal oral intake and states only drank Glucerna this morning.  Denies any chest pain or shortness of breath.  No overt bleeding.  Events overnight noted that patient had increased respiratory rate with some  shortness of breath.  Patient with low-grade fever this morning.  Objective: Vitals:   09/19/18 0546 09/19/18 0847 09/19/18 0952 09/19/18 1240  BP: (!) 123/56 (!) 130/55 (!) 126/48 (!) 110/53  Pulse: (!) 111 (!) 119 (!) 114 99  Resp: 20 (!) 26 (!) 21 (!) 22  Temp: 99.4 F (37.4 C) (!) 100.4 F (38 C) 98.5 F (36.9 C) 99.3 F (37.4 C)  TempSrc: Oral Oral Oral Oral  SpO2: 100% 96% 99% 99%  Weight:      Height:        Intake/Output Summary (Last 24 hours) at 09/19/2018 1327 Last data filed at 09/19/2018 1215 Gross per 24 hour  Intake 1162.76 ml  Output --  Net 1162.76 ml   Filed Weights   09/08/18 0000 09/14/18 0643  Weight: 57.4 kg 59.2 kg    Examination:  General exam: NAD Respiratory system: CTAB anterior lung fields with no wheezes, no crackles, no rhonchi.  Normal respiratory effort..   Cardiovascular system: RRR no murmurs rubs or gallops.  No JVD.  No lower extremity edema.   Gastrointestinal system: Abdomen is nontender, nondistended, soft, positive bowel sounds.  No rebound.  No guarding.   Central nervous system: Alert and oriented. No focal neurological deficits. Extremities: Symmetric 5 x 5 power. Skin: No rashes, lesions or ulcers Psychiatry: Judgement and insight appear fair. Mood & affect appropriate.     Data Reviewed: I have personally reviewed following labs and imaging studies  CBC: Recent Labs  Lab 09/16/18 1840 09/17/18 0442 09/17/18 1954 09/18/18 0258 09/19/18 0547  WBC 1.8* 1.8* 1.8* 1.7* 1.0*  NEUTROABS 0.6* 0.6* 0.8* 0.8* 0.4*  HGB 7.9* 7.5* 9.9* 9.4* 8.1*  HCT 24.4* 23.3* 30.5* 29.3* 25.5*  MCV 90.4 91.0 91.6 92.1 93.1  PLT 8* 21* 13* 10* <5*   Basic Metabolic Panel: Recent Labs  Lab 09/13/18 0500 09/14/18 0520 09/14/18 1744 09/15/18 0500 09/18/18 0258 09/19/18 0547  NA 136 136  --  137 133* 135  K 3.1* 3.8  --  3.5 3.4* 3.6  CL 101 100  --  99 97* 98  CO2 28 29  --  _0 GLUCOSE 178* 102*  --  57* 194* 138*  BUN 16  13  --  _1 CREATININE 0.32* <0.30*  --  <0.30* <0.30* <0.30*  CALCIUM 8.2* 8.3*  --  8.1* 7.4* 8.1*  MG  --   --   --  1.9  --   --   PHOS  --   --  3.6  --  3.8 3.3   GFR: CrCl cannot be calculated (This lab value cannot be used to calculate CrCl because it is not a number: <0.30). Liver Function Tests: Recent Labs  Lab 09/15/18 0500 09/18/18 0258 09/19/18 0547  AST 11*  --   --   ALT 15  --   --   ALKPHOS 211*  --   --   BILITOT 1.7*  --   --   PROT 5.5*  --   --  ALBUMIN 1.5* 1.4* 2.6*   No results for input(s): LIPASE, AMYLASE in the last 168 hours. No results for input(s): AMMONIA in the last 168 hours. Coagulation Profile: No results for input(s): INR, PROTIME in the last 168 hours. Cardiac Enzymes: No results for input(s): CKTOTAL, CKMB, CKMBINDEX, TROPONINI in the last 168 hours. BNP (last 3 results) No results for input(s): PROBNP in the last 8760 hours. HbA1C: No results for input(s): HGBA1C in the last 72 hours. CBG: Recent Labs  Lab 09/18/18 1303 09/18/18 1627 09/18/18 2152 09/19/18 0751 09/19/18 1212  GLUCAP 176* 163* 129* 156* 155*   Lipid Profile: No results for input(s): CHOL, HDL, LDLCALC, TRIG, CHOLHDL, LDLDIRECT in the last 72 hours. Thyroid Function Tests: No results for input(s): TSH, T4TOTAL, FREET4, T3FREE, THYROIDAB in the last 72 hours. Anemia Panel: No results for input(s): VITAMINB12, FOLATE, FERRITIN, TIBC, IRON, RETICCTPCT in the last 72 hours. Sepsis Labs: No results for input(s): PROCALCITON, LATICACIDVEN in the last 168 hours.  No results found for this or any previous visit (from the past 240 hour(s)).       Radiology Studies: Dg Chest Port 1 View  Result Date: 09/19/2018 CLINICAL DATA:  Pleural effusion. EXAM: PORTABLE CHEST 1 VIEW COMPARISON:  Radiograph of September 07, 2018.  CT scan of Jun 11, 2018. FINDINGS: Stable cardiomediastinal silhouette. Right internal jugular Port-A-Cath is unchanged in position. Stable  right upper lobe density is noted concerning for possible malignancy or metastatic disease. Stable bibasilar subsegmental atelectasis is noted with probable small right pleural effusion. Bony thorax is unremarkable. IMPRESSION: Stable bibasilar subsegmental atelectasis is noted with small right pleural effusion. Stable right upper lobe density is noted concerning for possible malignancy or metastatic disease as described on prior CT scan. Electronically Signed   By: Marijo Conception M.D.   On: 09/19/2018 07:03        Scheduled Meds:  sodium chloride   Intravenous Once   amiodarone  200 mg Oral Daily   Chlorhexidine Gluconate Cloth  6 each Topical Daily   feeding supplement (GLUCERNA SHAKE)  237 mL Oral Q4H   feeding supplement (PRO-STAT SUGAR FREE 64)  30 mL Oral TID   insulin aspart  0-5 Units Subcutaneous QHS   insulin aspart  0-9 Units Subcutaneous TID WC   mouth rinse  15 mL Mouth Rinse BID   montelukast  10 mg Oral QHS   pantoprazole  40 mg Oral Daily   sodium chloride flush  10-40 mL Intracatheter Q12H   Continuous Infusions:  albumin human 25 g (09/19/18 1250)     LOS: 12 days    Time spent: 40 minutes    Irine Seal, MD [facility]  If 7PM-7AM, please contact night-coverage [email or web address] 09/19/2018, 1:27 PM

## 2018-09-19 NOTE — Telephone Encounter (Signed)
Called [facility] to notify patient's nurse that Dr. Alvy Bimler is ordering comfort care and that her mother and sister are on their way to see her. They said her nurse is currently at lunch but will let her know.

## 2018-09-20 LAB — BPAM PLATELET PHERESIS
Blood Product Expiration Date: 202008222359
ISSUE DATE / TIME: 202008210931
Unit Type and Rh: 5100

## 2018-09-20 LAB — PREPARE PLATELET PHERESIS: Unit division: 0

## 2018-09-20 MED ORDER — HYDROMORPHONE HCL 1 MG/ML IJ SOLN
1.0000 mg | INTRAMUSCULAR | Status: DC | PRN
  Administered 2018-09-20 – 2018-09-21 (×7): 1 mg via INTRAVENOUS
  Filled 2018-09-20 (×6): qty 1

## 2018-09-20 MED ORDER — LIP MEDEX EX OINT
TOPICAL_OINTMENT | CUTANEOUS | Status: AC
  Administered 2018-09-20: 17:00:00
  Filled 2018-09-20: qty 7

## 2018-09-20 NOTE — TOC Progression Note (Signed)
Transition of Care [facility]) - Progression Note    Patient Details  Name: Stacy Shelton MRN: [id number] Date of Birth: 1960/04/27  Transition [facility]) CM/SW Contact  Joaquin Courts, RN Phone Number: 09/20/2018, 3:49 PM  Clinical Narrative:    CM notified by MD that patient and family are ready to explore residential hospice options. CM spoke with patient's sister [name], patient is resting, who expressed that they wish for [facility] place.  CM spoke with liaison, Venia Carbon, and notified of referral. No beds available today, liaison to communicate once bed becomes available.     Expected Discharge Plan: [place] Barriers to Discharge: Hospice Bed not available  Expected Discharge Plan and Services Expected Discharge Plan: [place]   Discharge Planning Services: CM Consult Post Acute Care Choice: Hospice Living arrangements for the past 2 months: Single Family Home                                       Social Determinants of Health (SDOH) Interventions    Readmission Risk Interventions Readmission Risk Prevention Plan 09/08/2018 06/13/2018 04/25/2018  Transportation Screening Complete Complete -  Medication Review Press photographer) Complete Complete -  PCP or Specialist appointment within 3-5 days of discharge Not Complete Not Complete (No Data)  PCP/Specialist Appt Not Complete comments not ready to dc not close to discharge -  [place] or Home Care Consult Complete Complete -  SW Recovery Care/Counseling Consult Complete Not Complete -  SW Consult Not Complete Comments - no evidence of need at this time -  Palliative Care Screening Not Applicable Not Applicable -  [place] Not Applicable Not Applicable -  Some recent data might be hidden

## 2018-09-20 NOTE — Progress Notes (Signed)
RN to room. Family present (pt mother and sister), family requested linen change. Advised family of previous attempts and pt refusal.  Pt refused again however family able to convince pt for linen change.  Pt given PRN Ativan for anxiety and Linen changed/pt bathed with help from NT [place] and [employer].  Pt now resting. Will continue to monitor.

## 2018-09-20 NOTE — Progress Notes (Signed)
Pt was awake and aware when I arrived, but difficult to understand. Her sister was attending to her as she complained of being hot. Pt's mother requested her pad to be changed and they she be changed. I gave message to staff who said they will take care of it. In addition to presence I provided pt's mother w/coffee as requested. Please page if additional assistance is needed. [place], MDiv   09/20/18 1600  Clinical Encounter Type  Visited With Patient and family together

## 2018-09-20 NOTE — Progress Notes (Signed)
PROGRESS NOTE    Stacy Shelton  GNF:[id number] DOB: 1961-01-05 DOA: 09/08/2018 PCP: Robyne Peers, MD    Brief Narrative:  Patient is a 59 year old Caucasian female with past medical history of metastatic breast cancer, diabetes mellitus type 2, asthma was brought to hospital after altered mental status. Found to be hypoglycemic with blood glucose 19. She was started on broad-spectrum antibiotic for infectious etiology. Also patient found to be severely anemic and thrombocytopenic. Transfused PRBC and platelets. Patient was hypotensive and required brief Levophed infusion on 8/10, off on 8/11. Patient initially was comfort measures however per oncology, mother and sister stated they never made decision to transfer her care to comfort measures and as such comfort orders were rescinded by oncology and patient now reverted back to a full CODE STATUS and patient wanting aggressive care at this time.    Assessment & Plan:   Principal Problem:   Sepsis ([place]) Active Problems:   Thrombocytopenia ([place])   Uncontrolled diabetes mellitus with hyperglycemia (HCC)   Metastatic breast cancer (HCC)   Pancytopenia, acquired ([place])   Acute lower UTI   Anemia associated with chemotherapy   Decubitus ulcers   Acute encephalopathy   Metastatic cancer to brain [facility])   Palliative care encounter   Metastatic cancer ([place])   Comfort measures only status   Encounter for hospice care discussion   Protein-calorie malnutrition, severe  1 sepsis with septic shock secondary to enterococcus UTI Patient during the hospitalization met criteria for sepsis at time of admission with low-grade fever, hypotension, tachycardia, tachypnea, hypoxia and sore throat likely secondary to UTI.  Patient was on vasopressors of Levophed initially and weaned off on 09/09/2018.  Patient pancultured and urine cultures positive for enterococcus.  Patient was initially on vancomycin and subsequently transitioned to IV cefepime and  has received a total of 7 days of antibiotics.  Patient with low-grade temp the morning of 09/19/2018, likely secondary to tumor fever. No further antibiotics needed at this time.  2.  Pancytopenia with thrombocytopenia Felt likely secondary to bone marrow involvement of metastatic breast cancer.  Patient has received a total of 6 units packed red blood cells as well as 6 units of platelets.  Patient to receive unit of platelets today as platelet count less than 5.  Patient's anemia and thrombocytopenia appear to be refractory to transfusions.  Was initially thought to have worsening due to antibiotics of Vanco and cefepime which have subsequently been discontinued with no significant improvement.  Patient initially wanted aggressive care and as such CODE STATUS was changed. Platelet count on 09/19/2018 was <5,  WBC of 1, hemoglobin of 8.1.  A unit of platelets was ordered for transfusion per oncology.  Oncology met with family again 09/19/2018,  for goals of care discussion and decision has been made to transition to full comfort measures.  Per oncology.  Per RN family requesting residential hospice placement as such we will consult with social work.  3.  Uncontrolled diabetes mellitus with hyperglycemia Hemoglobin A1c of 6.7 on 06/11/2018.  CBG of 156 this morning.  Patient noted to have bouts of hypoglycemia secondary to poor oral intake.  Levemir decreased to 10 units daily nightly has been discontinued.  Continue sliding scale insulin.  Due to poor oral intake patient's diet has been liberalized.  Patient has been transitioned to full comfort measures.  CBGs to be discontinued per oncology.  4.  Multiple decubitus ulcers, POA Per nursing documentation patient with bilateral ear pressure injuries, stage I, POA,  upper back pressure injury stage II, POA, sacral pressure injury unstageable, POA, left heel pressure injury, unstageable,POA Right thigh pressure injury, unstageable POA.  Per nursing patient  refusing to be turned a few days ago.  Patient agreed with wound care changes done, however stated was in significant pain.  Advised nursing to premedicate prior to wound care changing.  Continue current wound care if patient allows.  Follow.  5.  Deconditioning/debility Patient has not had physical therapy in at least 3 weeks.  Patient very deconditioned with myopathy of critical illness and poor oral intake.  Patient noted to be refusing initially during the hospitalization.  Patient is in agreement and motivated for physical therapy.  PT OT has been reordered.  Oncology met with family again on 09/19/2018 with goals of care discussion and decision is made to transition to full comfort measures.    6.  History of mild diastolic dysfunction 2D echo from May 2020 with a EF of 60 to 65%.  Patient currently euvolemic.  Patient noted to have some increased respiratory rate the night of 09/18/2018, and concern for possible volume overload.  Patient has received multiple transfusions.  Patient received a dose of Lasix 20 mg IV x1 with output of 1.6 L over the past 24 hours.  Currently asymptomatic.  Strict I's and O's,  daily weights.  Follow.  7.  Acute metabolic encephalopathy Patient with waxing and waning mental status felt multifactorial secondary to critical illness, UTI, poor oral intake, debility, mets to the brain.  Head CT on 09/11/2018-negative for any bleed or acute CVA.  Oncology following.  Palliative care following.  8.  Hypoalbuminemia, severe degree of malnutrition, severe deconditioning Albumin noted to be at 2.6 from 1.4 after given a days worth of IV albumin..  Continue current nutritional supplementation of pro-stat, Glucerna.  Patient encouraged to increase oral intake.  Patient with poor oral intake.  Patient diet has been liberalized.  Patient received another days worth of IV albumin which has been discontinued per oncology as patient has been transitioned to full comfort measures.     9.  Hypokalemia Repleted.  Patient now full comfort measures.  10.  Low-grade temp Patient noted with a low-grade fever the morning of 09/19/2018 of 100.4 and 100.2 the night of 09/18/2018.  Likely secondary to tumor burden.  Currently afebrile.  Chest x-ray done with no acute infiltrates.  Patient with no urinary symptoms.  No need for antibiotics at this time.   11.  Metastatic breast cancer Patient had goals of care meeting on 09/15/2018 in the presence of her family and at that time patient was somewhat confused.  Family requested DNR, comfort care and possible residential hospice.  Patient noted to have changed her mind and wanting full scope of treatment.  Goals of care meeting have been done with oncology and palliative care medicine on 09/16/2018.  Meeting again 09/17/2018 with family and patient wanting full scope of care at this time.  Per oncology.  Per oncology no further plans for chemotherapy and to continue supportive care at this time. Spoke with Dr. Alvy Bimler, oncology who had an hour-long goals of care meeting with family again 09/19/2018 and decision has been made to transition to full comfort.  Oncology has changed patient's CODE STATUS to DNR and adjusted medications.  Will place end-of-life order set.  Per RN family requesting patient be transferred to a residential hospice home.  Social work consulted.   DVT prophylaxis: SCDs/comfort measures Code Status: DNR Family Communication:  Updated patient.  No family at bedside. Disposition Plan: Per RN patient family requesting residential hospice home.  Patient now fULL comfort.  Social work consulted.    Consultants:   Oncology: Dr. Alvy Bimler  Palliative care: Florentina Jenny, PA 09/14/2018  Wound care  Procedures:   CT head 09/11/2018  Chest x-ray 09/09/2018  Upper extremity Dopplers 09/10/2018  Transfused total of 6 units packed red blood cells  Transfused total of 7 units platelets  Antimicrobials:   IV ampicillin  09/10/2018>>>> 09/14/2018  IV cefepime 09/08/2018>>>> 09/10/2018  IV vancomycin 09/25/2018>>>> 09/10/2018   Subjective: Patient laying in bed.  Family at bedside.  Patient stated she has some Glucerna earlier on today with a banana.  Family stating since they have been in the room patient has not eating anything yet.  Patient denies any chest pain or shortness of breath.  No overt bleeding. RN stating patient's family requesting patient be transferred to a residential hospice home.  Patient with poor oral intake.  Patient with less than 25% of her meals.  Objective: Vitals:   09/19/18 0847 09/19/18 0952 09/19/18 1240 09/20/18 0621  BP: (!) 130/55 (!) 126/48 (!) 110/53 (!) 122/56  Pulse: (!) 119 (!) 114 99 (!) 113  Resp: (!) 26 (!) 21 (!) 22   Temp: (!) 100.4 F (38 C) 98.5 F (36.9 C) 99.3 F (37.4 C) 99.1 F (37.3 C)  TempSrc: Oral Oral Oral Oral  SpO2: 96% 99% 99%   Weight:      Height:        Intake/Output Summary (Last 24 hours) at 09/20/2018 1221 Last data filed at 09/20/2018 0600 Gross per 24 hour  Intake 569 ml  Output 1600 ml  Net -1031 ml   Filed Weights   09/08/18 0000 09/14/18 0643  Weight: 57.4 kg 59.2 kg    Examination:  General exam: NAD Respiratory system: Lungs clear to auscultation bilaterally anterior lung fields.  No wheezes, no crackles, no rhonchi.  Speaking in full sentences.  Normal respiratory effort.  Cardiovascular system: Regular rate rhythm no murmurs rubs or gallops.  No JVD.  No lower extremity edema.  Gastrointestinal system: Abdomen is soft, nontender, nondistended, positive bowel sounds.  No rebound.  No guarding.   Central nervous system: Alert and oriented. No focal neurological deficits. Extremities: Symmetric 5 x 5 power. Skin: No rashes, lesions or ulcers Psychiatry: Judgement and insight appear fair. Mood & affect appropriate.     Data Reviewed: I have personally reviewed following labs and imaging studies  CBC: Recent Labs  Lab  09/16/18 1840 09/17/18 0442 09/17/18 1954 09/18/18 0258 09/19/18 0547  WBC 1.8* 1.8* 1.8* 1.7* 1.0*  NEUTROABS 0.6* 0.6* 0.8* 0.8* 0.4*  HGB 7.9* 7.5* 9.9* 9.4* 8.1*  HCT 24.4* 23.3* 30.5* 29.3* 25.5*  MCV 90.4 91.0 91.6 92.1 93.1  PLT 8* 21* 13* 10* <5*   Basic Metabolic Panel: Recent Labs  Lab 09/14/18 0520 09/14/18 1744 09/15/18 0500 09/18/18 0258 09/19/18 0547  NA 136  --  137 133* 135  K 3.8  --  3.5 3.4* 3.6  CL 100  --  99 97* 98  CO2 29  --  29 30 28   GLUCOSE 102*  --  57* 194* 138*  BUN 13  --  14 12 10   CREATININE <0.30*  --  <0.30* <0.30* <0.30*  CALCIUM 8.3*  --  8.1* 7.4* 8.1*  MG  --   --  1.9  --   --   PHOS  --  3.6  --  3.8 3.3   GFR: CrCl cannot be calculated (This lab value cannot be used to calculate CrCl because it is not a number: <0.30). Liver Function Tests: Recent Labs  Lab 09/15/18 0500 09/18/18 0258 09/19/18 0547  AST 11*  --   --   ALT 15  --   --   ALKPHOS 211*  --   --   BILITOT 1.7*  --   --   PROT 5.5*  --   --   ALBUMIN 1.5* 1.4* 2.6*   No results for input(s): LIPASE, AMYLASE in the last 168 hours. No results for input(s): AMMONIA in the last 168 hours. Coagulation Profile: No results for input(s): INR, PROTIME in the last 168 hours. Cardiac Enzymes: No results for input(s): CKTOTAL, CKMB, CKMBINDEX, TROPONINI in the last 168 hours. BNP (last 3 results) No results for input(s): PROBNP in the last 8760 hours. HbA1C: No results for input(s): HGBA1C in the last 72 hours. CBG: Recent Labs  Lab 09/18/18 1303 09/18/18 1627 09/18/18 2152 09/19/18 0751 09/19/18 1212  GLUCAP 176* 163* 129* 156* 155*   Lipid Profile: No results for input(s): CHOL, HDL, LDLCALC, TRIG, CHOLHDL, LDLDIRECT in the last 72 hours. Thyroid Function Tests: No results for input(s): TSH, T4TOTAL, FREET4, T3FREE, THYROIDAB in the last 72 hours. Anemia Panel: No results for input(s): VITAMINB12, FOLATE, FERRITIN, TIBC, IRON, RETICCTPCT in the last 72  hours. Sepsis Labs: No results for input(s): PROCALCITON, LATICACIDVEN in the last 168 hours.  No results found for this or any previous visit (from the past 240 hour(s)).       Radiology Studies: Dg Chest Port 1 View  Result Date: 09/19/2018 CLINICAL DATA:  Pleural effusion. EXAM: PORTABLE CHEST 1 VIEW COMPARISON:  Radiograph of September 07, 2018.  CT scan of Jun 11, 2018. FINDINGS: Stable cardiomediastinal silhouette. Right internal jugular Port-A-Cath is unchanged in position. Stable right upper lobe density is noted concerning for possible malignancy or metastatic disease. Stable bibasilar subsegmental atelectasis is noted with probable small right pleural effusion. Bony thorax is unremarkable. IMPRESSION: Stable bibasilar subsegmental atelectasis is noted with small right pleural effusion. Stable right upper lobe density is noted concerning for possible malignancy or metastatic disease as described on prior CT scan. Electronically Signed   By: Marijo Conception M.D.   On: 09/19/2018 07:03        Scheduled Meds:  sodium chloride   Intravenous Once   amiodarone  200 mg Oral Daily   Chlorhexidine Gluconate Cloth  6 each Topical Daily   feeding supplement (ENSURE ENLIVE)  237 mL Oral BID BM   feeding supplement (GLUCERNA SHAKE)  237 mL Oral Q4H   feeding supplement (PRO-STAT SUGAR FREE 64)  30 mL Oral TID   mouth rinse  15 mL Mouth Rinse BID   nutrition supplement (JUVEN)  1 packet Oral BID BM   sodium chloride flush  10-40 mL Intracatheter Q12H   Continuous Infusions:  chlorproMAZINE (THORAZINE) IV       LOS: 13 days    Time spent: 35 minutes    Irine Seal, MD [facility]  If 7PM-7AM, please contact night-coverage [email or web address] 09/20/2018, 12:21 PM

## 2018-09-20 NOTE — Progress Notes (Signed)
RN to room for assessment. Observed Pt's bedsheets and pad to be wet with odor. Asked pt if we could change her clothes, sheets, and pad. Pt refused. Educated pt on skin care and encouraged linen change. Pt declined again. Will continue to monitor.

## 2018-09-20 NOTE — Progress Notes (Signed)
Manufacturing engineer [facility])  Referral received from Nancy Marus, [place], for residential hospice at [facility].  There is no bed availability today at [facility].  Will update TOC manager and family tomorrow should this change.  Thank you, Venia Carbon RN, BSN, [facility] Liaison (in [place]

## 2018-09-21 DIAGNOSIS — C7951 Secondary malignant neoplasm of bone: Secondary | ICD-10-CM

## 2018-09-21 DIAGNOSIS — R52 Pain, unspecified: Secondary | ICD-10-CM

## 2018-09-21 DIAGNOSIS — R0602 Shortness of breath: Secondary | ICD-10-CM

## 2018-09-21 DIAGNOSIS — E43 Unspecified severe protein-calorie malnutrition: Secondary | ICD-10-CM

## 2018-09-21 DIAGNOSIS — C787 Secondary malignant neoplasm of liver and intrahepatic bile duct: Secondary | ICD-10-CM

## 2018-09-21 MED ORDER — LORAZEPAM 2 MG/ML IJ SOLN
0.5000 mg | Freq: Once | INTRAMUSCULAR | Status: AC
  Administered 2018-09-21: 0.5 mg via INTRAVENOUS
  Filled 2018-09-21: qty 1

## 2018-09-21 MED ORDER — HYDROMORPHONE BOLUS VIA INFUSION
0.5000 mg | INTRAVENOUS | Status: DC | PRN
  Filled 2018-09-21: qty 1

## 2018-09-21 MED ORDER — SODIUM CHLORIDE 0.9 % IV SOLN
1.0000 mg/h | INTRAVENOUS | Status: DC
  Administered 2018-09-21: 0.5 mg/h via INTRAVENOUS
  Filled 2018-09-21: qty 2.5

## 2018-09-21 MED ORDER — LORAZEPAM 2 MG/ML IJ SOLN
0.5000 mg | Freq: Four times a day (QID) | INTRAMUSCULAR | Status: DC
  Administered 2018-09-21: 0.5 mg via INTRAVENOUS
  Filled 2018-09-21: qty 1

## 2018-09-21 MED ORDER — FUROSEMIDE 10 MG/ML IJ SOLN
20.0000 mg | Freq: Once | INTRAMUSCULAR | Status: AC
  Administered 2018-09-21: 20 mg via INTRAVENOUS
  Filled 2018-09-21: qty 2

## 2018-09-21 MED ORDER — HYDROMORPHONE HCL 1 MG/ML IJ SOLN
1.0000 mg | Freq: Once | INTRAMUSCULAR | Status: AC
  Administered 2018-09-21: 1 mg via INTRAVENOUS
  Filled 2018-09-21: qty 1

## 2018-09-21 MED ORDER — HYDROMORPHONE HCL 1 MG/ML IJ SOLN
1.0000 mg | INTRAMUSCULAR | Status: DC | PRN
  Administered 2018-09-21: 1 mg via INTRAVENOUS
  Filled 2018-09-21 (×2): qty 1

## 2018-09-29 ENCOUNTER — Other Ambulatory Visit: Payer: BC Managed Care – PPO

## 2018-09-29 ENCOUNTER — Ambulatory Visit: Payer: BC Managed Care – PPO | Admitting: Hematology and Oncology
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
## 2018-09-30 NOTE — Progress Notes (Signed)
[facility])  Pt is actively dying and is not stable for ambulance transportation to [facility].     Please call with any questions/concerns. Venia Carbon RN, BSN, [facility] Liaison (in [place]) [phone]

## 2018-09-30 NOTE — Death Summary Note (Addendum)
Death Summary  Stacy Shelton SUP:[id number] DOB: September 13, 1960 DOA: 10/06/18  PCP: Robyne Peers, MD PCP/Office notified:   Admit date: 2018-10-06 Date of Death: 2018-10-20 1453 hours  Final Diagnoses:  Principal Problem:   Metastatic breast cancer ([place]) Active Problems:   Pancytopenia, acquired ([place])   Metastatic cancer to brain ([place])   Thrombocytopenia ([place])   Uncontrolled diabetes mellitus with hyperglycemia ([place])   Metastasis to brain ([place])   Metastasis to liver ([place])   Metastasis to bone ([place])   Sepsis ([place])   Acute lower UTI   Anemia associated with chemotherapy   Metastasis to lung [facility])   Decubitus ulcers   Acute encephalopathy   Palliative care encounter   Metastatic cancer ([place])   Comfort measures only status   Encounter for hospice care discussion   Protein-calorie malnutrition, severe   History of present illness:  HPI per Dr. Marthenia Rolling Patient is a 58 year old Caucasian female with past medical history significant for metastatic breast cancer, diabetes mellitus and asthma.  Due to patient's altered mentation, patient could not or not give any significant history.  Collateral information revealed that patient's mental status has been altered, blood sugar of 19 was documented on presentation.  Low-grade temperature was also documented (100.5 F), hemoglobin of 6.6 g/dL and platelet count of 5 when noted.  UA is pending.  Tests x-ray finding is noted.  Patient has pressure sores.  Systolic blood pressure has been on the low to low normal side, ranging from 83 to 93 mmHg.  Sinus tachycardia of 120 bpm has been documented.  Patient is currently being transfused 1 unit of platelets and 1 unit of packed red blood cells.  Patient has been Pancultured, started on broad-spectrum antibiotics.  Hospitalist team has been asked to admit patient for further assessment and management.  Patient remains full code.  ED Course: Temperature of 100.5 F is noted, systolic blood pressure ranges  from 83 to 93 mmHg.  Heart rate is 120 bpm.  Significant labs done reveal WBC of 5.8, hemoglobin of 6.6, platelet count of 5, sodium of 128, chloride of 94 and calcium of 73.  Rapid COVID-19 test came back negative.  Chest x-ray is said to reveal small bilateral pleural effusion, bibasilar atelectasis or infiltrate with right upper lobe rounded opacity seen on prior CT an additional 14 mm right upper lobe pulmonary nodule.  Blood sugar of 19 was documented on presentation, but last blood sugar check was 118.  Patient has been pancultured by emergency room physician and started on broad-spectrum antibiotics.  Hospitalist team has been asked to admit patient for further assessment and management.  Pertinent labs: Kindly see above.  Blood sugar of 19-118.  Bili of 1.5.  Chemistry reveals sodium of 128, potassium of 3.5, chloride of 94, CO2 of 24, BUN of 12, creatinine of 0.49 with a blood sugar of 214.  Albumin is 1.7 with total protein of 5.5.  AST is 23 and ALT is 16.  CBC reveals WBC of 5.8, hemoglobin of 6.6 g/dL, hematocrit of 21, MCV of 90.5 with platelet count of 5.   Hospital Course:  1 sepsis with septic shock secondary to enterococcus UTI, POA Patient during the hospitalization met criteria for sepsis at time of admission with low-grade fever, hypotension, tachycardia, tachypnea, hypoxia and sore throat likely secondary to UTI.  Patient was on vasopressors of Levophed initially and weaned off on 09/09/2018.  Patient pancultured and urine cultures positive for enterococcus.  Patient was initially on vancomycin and  subsequently transitioned to IV cefepime and has received a total of 7 days of antibiotics.  Patient with low-grade temp the morning of 09/19/2018, likely secondary to tumor fever. No further antibiotics needed at this time.  Patient has now been transitioned to full comfort measures.  Patient unresponsive.  Patient with some labored breathing.  Patient was placed on a Dilaudid drip.  Patient  was kept comfortable.  Patient was subsequently pronounced dead at 1453 hrs. on October 11, 2018.  2.  Pancytopenia with thrombocytopenia Felt likely secondary to bone marrow involvement of metastatic breast cancer.  Patient has received a total of 6 units packed red blood cells as well as 6 units of platelets.  Patient to receive unit of platelets today as platelet count less than 5.  Patient's anemia and thrombocytopenia appear to be refractory to transfusions.  Was initially thought to have worsening due to antibiotics of Vanco and cefepime which have subsequently been discontinued with no significant improvement.  Patient initially wanted aggressive care and as such CODE STATUS was changed. Platelet count on 09/19/2018 was <5,  WBC of 1, hemoglobin of 8.1.  A unit of platelets was ordered for transfusion per oncology.  Oncology met with family again 09/19/2018,  for goals of care discussion and decision has been made to transition to full comfort measures.  Per oncology.  Patient now has been transitioned to full comfort measures.  Patient unresponsive.  Patient with some labored breathing.  Patient was placed on a Dilaudid drip.  Patient was made comfortable.  Patient was subsequently pronounced dead at 1453 hrs. on 10/11/18.   3.  Uncontrolled diabetes mellitus with hyperglycemia Hemoglobin A1c of 6.7 on 06/11/2018.  CBG of 156 this morning.  Patient noted to have bouts of hypoglycemia secondary to poor oral intake.  Levemir decreased to 10 units daily nightly has been discontinued.  Continue sliding scale insulin.  Due to poor oral intake patient's diet has been liberalized.  Patient was transitioned to full comfort measures.   4.  Multiple decubitus ulcers, POA Per nursing documentation patient with bilateral ear pressure injuries, stage I, POA, upper back pressure injury stage II, POA, sacral pressure injury unstageable, POA, left heel pressure injury, unstageable,POA Right thigh pressure injury,  unstageable POA.  Per nursing patient refusing to be turned a few days ago.  Patient agreed with wound care changes done, however stated was in significant pain.  Advised nursing to premedicate prior to wound care changing.  Patient was subsequently transition to full comfort measures.  5.  Deconditioning/debility Patient has not had physical therapy in at least 3 weeks.  Patient very deconditioned with myopathy of critical illness and poor oral intake.  Patient noted to be refusing initially during the hospitalization.  Patient is in agreement and motivated for physical therapy.  PT OT has been reordered.  Oncology met with family again on 09/19/2018 with goals of care discussion and decision was made to transition to full comfort measures.  Patient's condition continued to deteriorate.  Patient was subsequently placed on a Dilaudid drip.  Patient was pronounced dead at 1453 hrs. on 2018/10/11.   6.  History of mild diastolic dysfunction 2D echo from May 2020 with a EF of 60 to 65%.  Patient currently euvolemic.  Patient noted to have some increased respiratory rate the night of 09/18/2018, and concern for possible volume overload.  Patient has received multiple transfusions.  Patient received a dose of Lasix 20 mg IV x1.  Patient was subsequently transitioned for the  full comfort measures.   7.  Acute metabolic encephalopathy Patient with waxing and waning mental status felt multifactorial secondary to critical illness, UTI, poor oral intake, debility, mets to the brain.  Head CT on 09/11/2018-negative for any bleed or acute CVA.  Patient now unresponsive.    Oncology follow the patient as well as palliative care.  Patient was subsequently transition to full comfort measures.  Patient's condition continued to deteriorate and patient subsequently placed on a Dilaudid drip.  Patient was subsequently pronounced dead at 1453 hrs. on 09-23-2018.    8.  Hypoalbuminemia, severe degree of malnutrition, severe  deconditioning Albumin noted to be at 2.6 from 1.4 after given a days worth of IV albumin..  Continue current nutritional supplementation of pro-stat, Glucerna.  Patient encouraged to increase oral intake.  Patient with poor oral intake.  Patient diet has been liberalized.  Patient received another days worth of IV albumin which has been discontinued per oncology as patient has been transitioned to full comfort measures.    Patient was placed on a Dilaudid drip.  Patient was subsequently pronounced dead at 1453 hrs. on 09-23-2018.  9.  Hypokalemia Repleted.  Patient was subsequently transition to full comfort measures.   10.  Low-grade temp Patient noted with a low-grade fever the morning of 09/19/2018 of 100.4 and 100.2 the night of 09/18/2018.  Likely secondary to tumor burden.  Currently afebrile.  Chest x-ray done with no acute infiltrates.  Patient with no urinary symptoms.  No further antibiotics were ordered.  Patient's condition deteriorated.  Patient was made full comfort measures.  Patient subsequently placed on a Dilaudid drip.  Patient was pronounced dead at 1453 hrs. on 09/23/18.  11.  Metastatic breast cancer Patient had goals of care meeting on 09/15/2018 in the presence of her family and at that time patient was somewhat confused.  Family requested DNR, comfort care and possible residential hospice.  Patient noted to have changed her mind and wanting full scope of treatment.  Goals of care meeting have been done with oncology and palliative care medicine on 09/16/2018.  Meeting again 09/17/2018 with family and patient wanting full scope of care at this time.  Per oncology.  Per oncology no further plans for chemotherapy and to continue supportive care at this time. Spoke with Dr. Alvy Bimler, oncology who had an hour-long goals of care meeting with family again 09/19/2018 and decision has been made to transition to full comfort.  Oncology has changed patient's CODE STATUS to DNR and adjusted  medications.  Patient was placed on end-of-life order set.  Patient currently unresponsive with some labored breathing.  Palliative care consulted and patient has been started on a Dilaudid drip for comfort measures.  Palliative care follow the patient.  Patient was subsequently pronounced dead at 1453 hrs on 09/23/18.  May his soul rest in peace.    Time: 40 minutes  No charge.  Signed:  Irine Seal  [facility] 09-23-18, 3:35 PM

## 2018-09-30 NOTE — Progress Notes (Signed)
Staff had just finished cleaning patient and left room, sister came to door and asked that staff check on patient.  This RN noted no respirations, no heart beat.  Death verified by 2nd RN Barbie Haggis. MD notified.

## 2018-09-30 NOTE — Progress Notes (Signed)
Daily Progress Note   Patient Name: Stacy Shelton       Date: October 20, 2018 DOB: January 05, 1961  Age: 58 y.o. MRN#: [id number] Attending Physician: Stacy Filler, MD Primary Care Physician: Stacy Peers, MD Admit Date: 09/25/2018  Reason for Consultation/Follow-up: Terminal Care  Subjective: Patient is not responsive.  She has rapid breathing, using accessory muscles.  Sister and friend are at the bedside.  Discussed with them in detail about end-of-life signs and symptoms.  See below.  Length of Stay: 14  Current Medications: Scheduled Meds:  . sodium chloride   Intravenous Once  . amiodarone  200 mg Oral Daily  . Chlorhexidine Gluconate Cloth  6 each Topical Daily  . feeding supplement (ENSURE ENLIVE)  237 mL Oral BID BM  . feeding supplement (GLUCERNA SHAKE)  237 mL Oral Q4H  . feeding supplement (PRO-STAT SUGAR FREE 64)  30 mL Oral TID  . LORazepam  0.5 mg Intravenous Q6H  . mouth rinse  15 mL Mouth Rinse BID  . nutrition supplement (JUVEN)  1 packet Oral BID BM  . sodium chloride flush  10-40 mL Intracatheter Q12H    Continuous Infusions: . chlorproMAZINE (THORAZINE) IV    . HYDROmorphone      PRN Meds: acetaminophen **OR** acetaminophen, albuterol, antiseptic oral rinse, bisacodyl, chlorproMAZINE (THORAZINE) IV, diphenhydrAMINE, glycopyrrolate **OR** glycopyrrolate **OR** glycopyrrolate, haloperidol **OR** haloperidol **OR** haloperidol lactate, [DISCONTINUED] haloperidol **OR** [DISCONTINUED] haloperidol **OR** haloperidol lactate, HYDROmorphone **AND** HYDROmorphone, HYDROmorphone (DILAUDID) injection, HYDROmorphone (DILAUDID) injection, lidocaine-prilocaine, magic mouthwash w/lidocaine, morphine, nystatin, ondansetron **OR** ondansetron (ZOFRAN) IV, oxybutynin, polyvinyl  alcohol  Physical Exam         Patient is essentially unresponsive Shallow breaths anterior lung fields no wheezes heard Patient using accessory muscles of respiration S1-S2, hyper dynamic precordium Abdomen is soft does not appear distended Extremities patient has some pressure changes to the skin has dressings on both heels extremities somewhat edematous no coolness no mottling detected Unresponsive Cancer related cachexia evident Appears mildly cyanotic as well  Vital Signs: BP 123/61 (BP Location: Left Arm)   Pulse (!) 119   Temp 100.3 F (37.9 C) (Oral)   Resp (!) 28   Ht 5\' 2"  (1.575 m)   Wt 59.2 kg   LMP 10/30/2010   SpO2 92%   BMI 23.87 kg/m  SpO2: SpO2: 92 %  O2 Device: O2 Device: Nasal Cannula O2 Flow Rate: O2 Flow Rate (L/min): 2 L/min  Intake/output summary:   Intake/Output Summary (Last 24 hours) at Sep 25, 2018 0956 Last data filed at 09-25-18 0615 Gross per 24 hour  Intake 0 ml  Output 4451 ml  Net -4451 ml   LBM: Last BM Date: 09/20/18 Baseline Weight: Weight: 57.4 kg Most recent weight: Weight: 59.2 kg       Palliative Assessment/Data:      Patient Active Problem List   Diagnosis Date Noted  . Protein-calorie malnutrition, severe 09/19/2018  . Metastatic cancer ([place])   . Comfort measures only status   . Encounter for hospice care discussion   . Metastatic cancer to brain ([place])   . Palliative care encounter   . Acute encephalopathy 09/28/2018  . Decubitus ulcers 09/01/2018  . Nausea and vomiting 08/13/2018  . Olecranon bursitis, left elbow 07/28/2018  . Tachycardia   . Acute diastolic heart failure ([place])   . Pressure injury of skin 06/13/2018  . Severe anemia 06/11/2018  . Other constipation 05/20/2018  . Metastasis to lung ([place]) 05/20/2018  . Cancer associated pain 05/15/2018  . Physical debility 05/13/2018  . S/P thoracentesis   . SVT (supraventricular tachycardia) ([place])   . Increased oxygen demand   . Tachypnea   . Hypoxia   .  Fever 04/24/2018  . Chronic diastolic CHF (congestive heart failure) ([place]) 04/24/2018  . Anemia associated with chemotherapy 04/24/2018  . Chemotherapy-induced thrombocytopenia 04/24/2018  . Acute cystitis without hematuria   . Pneumonia due to infectious organism   . Malnutrition of moderate degree 04/09/2018  . Septic shock ([place])   . Sepsis ([place]) 04/07/2018  . Hypokalemia 04/07/2018  . Hypomagnesemia 04/07/2018  . Acute lower UTI 04/07/2018  . Hypophosphatemia 04/07/2018  . Metastatic breast cancer ([place]) 03/17/2018  . Metastasis to brain ([place]) 03/17/2018  . Metastasis to liver ([place]) 03/17/2018  . Metastasis to bone ([place]) 03/17/2018  . Pancytopenia, acquired ([place]) 03/17/2018  . Goals of care, counseling/discussion 03/17/2018  . Encounter for antineoplastic chemotherapy 03/17/2018  . Thrombocytopenia ([place]) 03/11/2018  . Uncontrolled diabetes mellitus with hyperglycemia ([place]) 03/11/2018  . Moderate persistent asthma 03/11/2018    Palliative Care Assessment & Plan   Patient Profile:    Assessment: Stacy Shelton is a 58 year old lady with a life limiting illness of metastatic breast cancer.  She has been seen by palliative service in the past.  A follow-up was requested for additional goals of care discussions with regards to terminal care, aggressive symptom management at end-of-life and for providing additional support to patient and family as a unit.  Appears to be actively dying.  Patient seen and examined, discussed in detail with family at bedside.  Recommendations/Plan:  Dilaudid continuous infusion along with availability of bolus dosages.  Scheduled Ativan  Robinul as needed  Spiritual care consult  Comfort cart for the family  Anticipated hospital death.  Prognosis appears to be a short as a few hours to as long as some very limited number of days possibly not longer than 1-2 days, discussed frankly but compassionately with sister and friend present at the bedside.   Offered active listening and supportive care.  Goals of Care and Additional Recommendations:  Limitations on Scope of Treatment: Full Comfort Care  Code Status:    Code Status Orders  (From admission, onward)         Start     Ordered   09/19/18 1845  Do not attempt resuscitation (DNR)  Continuous    Question Answer Comment  In the event of cardiac or respiratory ARREST Do not call a "code blue"   In the event of cardiac or respiratory ARREST Do not perform Intubation, CPR, defibrillation or ACLS   In the event of cardiac or respiratory ARREST Use medication by any route, position, wound care, and other measures to relive pain and suffering. May use oxygen, suction and manual treatment of airway obstruction as needed for comfort.      09/19/18 1846        Code Status History    Date Active Date Inactive Code Status Order ID Comments User Context   09/19/2018 1320 09/19/2018 1846 DNR [id number]  [name], MD Inpatient   09/16/2018 1728 09/19/2018 1320 Full Code [id number]  [name], MD Inpatient   09/15/2018 1440 09/16/2018 1728 DNR [id number]  Fuller Canada, PA-C Inpatient   09/15/2018 1319 09/15/2018 1440 DNR [id number]  Fuller Canada, PA-C Inpatient   09/26/2018 2324 09/15/2018 1319 Full Code [id number]  Bonnell Public, MD Inpatient   06/11/2018 1535 06/22/2018 1824 Full Code [id number]  Deatra James, MD Inpatient   04/30/2018 1252 05/06/2018 1944 Full Code [id number]  Bonnielee Haff, MD Inpatient   04/29/2018 1255 04/30/2018 1252 Partial Code [id number]  Donita Brooks, NP Inpatient   04/24/2018 1504 04/29/2018 1255 Full Code [id number]  Karmen Bongo, MD Inpatient   04/07/2018 2044 04/16/2018 1858 Full Code [id number]  Toy Baker, MD Inpatient   03/11/2018 0013 03/13/2018 1708 Full Code [id number]  Vianne Bulls, MD ED   Advance Care Planning Activity       Prognosis:   Hours - Days  Discharge Planning:  Anticipated Hospital Death Would recommend  patient not being moved to residential hospice, patient appears to have several end-of-life signs and symptoms evident.  Discussed in detail with family present at the bedside about end-of-life signs and symptoms and about judicious use of opioids and benzodiazepines with comfort being the singular goal at this point in time.  Care plan was discussed with patient's sister and friend present at the bedside, bedside nurse, [facility] MD Dr. Grandville Silos as well.  Thank you for allowing the Palliative Medicine Team to assist in the care of this patient.   Time In: 9 Time Out: 9.35 Total Time 35 Prolonged Time Billed  no       Greater than 50%  of this time was spent counseling and coordinating care related to the above assessment and plan.  Loistine Chance, MD [id number] Please contact Palliative Medicine Team phone at [phone] for questions and concerns.

## 2018-09-30 NOTE — Progress Notes (Signed)
PROGRESS NOTE    Stacy Shelton  HQI:[id number] DOB: 01-09-1961 DOA: 09/07/2018 PCP: Robyne Peers, MD    Brief Narrative:  Patient is a 58 year old Caucasian female with past medical history of metastatic breast cancer, diabetes mellitus type 2, asthma was brought to hospital after altered mental status. Found to be hypoglycemic with blood glucose 19. She was started on broad-spectrum antibiotic for infectious etiology. Also patient found to be severely anemic and thrombocytopenic. Transfused PRBC and platelets. Patient was hypotensive and required brief Levophed infusion on 8/10, off on 8/11. Patient initially was comfort measures however per oncology, mother and sister stated they never made decision to transfer her care to comfort measures and as such comfort orders were rescinded by oncology and patient now reverted back to a full CODE STATUS and patient wanting aggressive care at this time.    Assessment & Plan:   Principal Problem:   Sepsis ([place]) Active Problems:   Thrombocytopenia ([place])   Uncontrolled diabetes mellitus with hyperglycemia (H[facility])   Metastatic breast cancer (H[facility])   Pancytopenia, acquired ([place])   Acute lower UTI   Anemia associated with chemotherapy   Decubitus ulcers   Acute encephalopathy   Metastatic cancer to brain [facility])   Palliative care encounter   Metastatic cancer ([place])   Comfort measures only status   Encounter for hospice care discussion   Protein-calorie malnutrition, severe  1 sepsis with septic shock secondary to enterococcus UTI Patient during the hospitalization met criteria for sepsis at time of admission with low-grade fever, hypotension, tachycardia, tachypnea, hypoxia and sore throat likely secondary to UTI.  Patient was on vasopressors of Levophed initially and weaned off on 09/09/2018.  Patient pancultured and urine cultures positive for enterococcus.  Patient was initially on vancomycin and subsequently transitioned to IV cefepime and  has received a total of 7 days of antibiotics.  Patient with low-grade temp the morning of 09/19/2018, likely secondary to tumor fever. No further antibiotics needed at this time.  Patient has now been transitioned to full comfort measures.  Patient unresponsive.  Patient with some labored breathing.  Patient has been placed on a Dilaudid drip.  2.  Pancytopenia with thrombocytopenia Felt likely secondary to bone marrow involvement of metastatic breast cancer.  Patient has received a total of 6 units packed red blood cells as well as 6 units of platelets.  Patient to receive unit of platelets today as platelet count less than 5.  Patient's anemia and thrombocytopenia appear to be refractory to transfusions.  Was initially thought to have worsening due to antibiotics of Vanco and cefepime which have subsequently been discontinued with no significant improvement.  Patient initially wanted aggressive care and as such CODE STATUS was changed. Platelet count on 09/19/2018 was <5,  WBC of 1, hemoglobin of 8.1.  A unit of platelets was ordered for transfusion per oncology.  Oncology met with family again 09/19/2018,  for goals of care discussion and decision has been made to transition to full comfort measures.  Per oncology.  Patient now has been transitioned to full comfort measures.  Patient unresponsive.  Patient with some labored breathing.  Patient has been started on a Dilaudid drip.  Likely in hospital death.   3.  Uncontrolled diabetes mellitus with hyperglycemia Hemoglobin A1c of 6.7 on 06/11/2018.  CBG of 156 this morning.  Patient noted to have bouts of hypoglycemia secondary to poor oral intake.  Levemir decreased to 10 units daily nightly has been discontinued.  Continue sliding scale insulin.  Due to poor oral intake patient's diet has been liberalized.  Patient has been transitioned to full comfort measures.  CBGs to be discontinued per oncology.  4.  Multiple decubitus ulcers, POA Per nursing  documentation patient with bilateral ear pressure injuries, stage I, POA, upper back pressure injury stage II, POA, sacral pressure injury unstageable, POA, left heel pressure injury, unstageable,POA Right thigh pressure injury, unstageable POA.  Per nursing patient refusing to be turned a few days ago.  Patient agreed with wound care changes done, however stated was in significant pain.  Advised nursing to premedicate prior to wound care changing.  Continue current wound care if patient allows.  Follow.  5.  Deconditioning/debility Patient has not had physical therapy in at least 3 weeks.  Patient very deconditioned with myopathy of critical illness and poor oral intake.  Patient noted to be refusing initially during the hospitalization.  Patient is in agreement and motivated for physical therapy.  PT OT has been reordered.  Oncology met with family again on 09/19/2018 with goals of care discussion and decision is made to transition to full comfort measures.    6.  History of mild diastolic dysfunction 2D echo from May 2020 with a EF of 60 to 65%.  Patient currently euvolemic.  Patient noted to have some increased respiratory rate the night of 09/18/2018, and concern for possible volume overload.  Patient has received multiple transfusions.  Patient received a dose of Lasix 20 mg IV x1.  Patient has been transitioned for the full comfort measures.   7.  Acute metabolic encephalopathy Patient with waxing and waning mental status felt multifactorial secondary to critical illness, UTI, poor oral intake, debility, mets to the brain.  Head CT on 09/11/2018-negative for any bleed or acute CVA.  Patient now unresponsive.  Patient likely at end-of-life.  Oncology following.  Palliative care following.  Patient has been started on Dilaudid drip.  8.  Hypoalbuminemia, severe degree of malnutrition, severe deconditioning Albumin noted to be at 2.6 from 1.4 after given a days worth of IV albumin..  Continue current  nutritional supplementation of pro-stat, Glucerna.  Patient encouraged to increase oral intake.  Patient with poor oral intake.  Patient diet has been liberalized.  Patient received another days worth of IV albumin which has been discontinued per oncology as patient has been transitioned to full comfort measures.    9.  Hypokalemia Repleted.  Patient now full comfort measures.  10.  Low-grade temp Patient noted with a low-grade fever the morning of 09/19/2018 of 100.4 and 100.2 the night of 09/18/2018.  Likely secondary to tumor burden.  Currently afebrile.  Chest x-ray done with no acute infiltrates.  Patient with no urinary symptoms.  No need for antibiotics at this time.   11.  Metastatic breast cancer Patient had goals of care meeting on 09/15/2018 in the presence of her family and at that time patient was somewhat confused.  Family requested DNR, comfort care and possible residential hospice.  Patient noted to have changed her mind and wanting full scope of treatment.  Goals of care meeting have been done with oncology and palliative care medicine on 09/16/2018.  Meeting again 09/17/2018 with family and patient wanting full scope of care at this time.  Per oncology.  Per oncology no further plans for chemotherapy and to continue supportive care at this time. Spoke with Dr. Alvy Bimler, oncology who had an hour-long goals of care meeting with family again 09/19/2018 and decision has been made to  transition to full comfort.  Oncology has changed patient's CODE STATUS to DNR and adjusted medications.  Patient was placed on end-of-life order set.  Patient currently unresponsive with some labored breathing.  Palliative care consulted and patient has been started on a Dilaudid drip for comfort measures.  Patient likely in hospital death.  Appreciate palliative care input and recommendations.     DVT prophylaxis: comfort measures Code Status: DNR Family Communication: Updated sister and mother at bedside.   Disposition Plan: Likely in hospital death.   Consultants:   Oncology: Dr. Alvy Bimler  Palliative care: Florentina Jenny, PA 09/14/2018  Wound care  Procedures:   CT head 09/11/2018  Chest x-ray 09/15/2018  Upper extremity Dopplers 09/10/2018  Transfused total of 6 units packed red blood cells  Transfused total of 7 units platelets  Antimicrobials:   IV ampicillin 09/10/2018>>>> 09/14/2018  IV cefepime 09/08/2018>>>> 09/10/2018  IV vancomycin 09/06/2018>>>> 09/10/2018   Subjective: Patient unresponsive.  Some labored breathing.  Family at bedside.   Objective: Vitals:   09/20/18 0621 09/20/18 1725 09/20/18 1736 10-05-18 0536  BP: (!) 122/56 123/61    Pulse: (!) 113 (!) 123 (!) 119   Resp:  19  (!) 28  Temp: 99.1 F (37.3 C) 100.3 F (37.9 C)    TempSrc: Oral Oral    SpO2:  (!) 86% 92%   Weight:      Height:        Intake/Output Summary (Last 24 hours) at 10/05/18 1300 Last data filed at 2018/10/05 0615 Gross per 24 hour  Intake 0 ml  Output 4451 ml  Net -4451 ml   Filed Weights   09/08/18 0000 09/14/18 0643  Weight: 57.4 kg 59.2 kg    Examination:  General exam: Patient nonresponsive.  Some shallow breaths. Respiratory system: CTAB anterior lung fields.  No wheezes, no crackles, no rhonchi.  Some labored breathing. Cardiovascular system: RRR no murmurs rubs or gallops.  No JVD.  No lower extremity edema.  Gastrointestinal system: Abdomen is soft, nontender, nondistended, positive bowel sounds.  No rebound.  No guarding.   Central nervous system: Unresponsive.  Extremities: Symmetric 5 x 5 power. Skin: No rashes, lesions or ulcers Psychiatry: Judgement and insight unable to assess as patient unresponsive.      Data Reviewed: I have personally reviewed following labs and imaging studies  CBC: Recent Labs  Lab 09/16/18 1840 09/17/18 0442 09/17/18 1954 09/18/18 0258 09/19/18 0547  WBC 1.8* 1.8* 1.8* 1.7* 1.0*  NEUTROABS 0.6* 0.6* 0.8* 0.8* 0.4*   HGB 7.9* 7.5* 9.9* 9.4* 8.1*  HCT 24.4* 23.3* 30.5* 29.3* 25.5*  MCV 90.4 91.0 91.6 92.1 93.1  PLT 8* 21* 13* 10* <5*   Basic Metabolic Panel: Recent Labs  Lab 09/14/18 1744 09/15/18 0500 09/18/18 0258 09/19/18 0547  NA  --  137 133* 135  K  --  3.5 3.4* 3.6  CL  --  99 97* 98  CO2  --  _0 GLUCOSE  --  57* 194* 138*  BUN  --  _1 CREATININE  --  <0.30* <0.30* <0.30*  CALCIUM  --  8.1* 7.4* 8.1*  MG  --  1.9  --   --   PHOS 3.6  --  3.8 3.3   GFR: CrCl cannot be calculated (This lab value cannot be used to calculate CrCl because it is not a number: <0.30). Liver Function Tests: Recent Labs  Lab 09/15/18 0500 09/18/18 0258 09/19/18 0547  AST 11*  --   --  ALT 15  --   --   ALKPHOS 211*  --   --   BILITOT 1.7*  --   --   PROT 5.5*  --   --   ALBUMIN 1.5* 1.4* 2.6*   No results for input(s): LIPASE, AMYLASE in the last 168 hours. No results for input(s): AMMONIA in the last 168 hours. Coagulation Profile: No results for input(s): INR, PROTIME in the last 168 hours. Cardiac Enzymes: No results for input(s): CKTOTAL, CKMB, CKMBINDEX, TROPONINI in the last 168 hours. BNP (last 3 results) No results for input(s): PROBNP in the last 8760 hours. HbA1C: No results for input(s): HGBA1C in the last 72 hours. CBG: Recent Labs  Lab 09/18/18 1303 09/18/18 1627 09/18/18 2152 09/19/18 0751 09/19/18 1212  GLUCAP 176* 163* 129* 156* 155*   Lipid Profile: No results for input(s): CHOL, HDL, LDLCALC, TRIG, CHOLHDL, LDLDIRECT in the last 72 hours. Thyroid Function Tests: No results for input(s): TSH, T4TOTAL, FREET4, T3FREE, THYROIDAB in the last 72 hours. Anemia Panel: No results for input(s): VITAMINB12, FOLATE, FERRITIN, TIBC, IRON, RETICCTPCT in the last 72 hours. Sepsis Labs: No results for input(s): PROCALCITON, LATICACIDVEN in the last 168 hours.  No results found for this or any previous visit (from the past 240 hour(s)).       Radiology  Studies: No results found.      Scheduled Meds: . sodium chloride   Intravenous Once  . amiodarone  200 mg Oral Daily  . Chlorhexidine Gluconate Cloth  6 each Topical Daily  . feeding supplement (ENSURE ENLIVE)  237 mL Oral BID BM  . feeding supplement (GLUCERNA SHAKE)  237 mL Oral Q4H  . feeding supplement (PRO-STAT SUGAR FREE 64)  30 mL Oral TID  . LORazepam  0.5 mg Intravenous Q6H  . mouth rinse  15 mL Mouth Rinse BID  . nutrition supplement (JUVEN)  1 packet Oral BID BM  . sodium chloride flush  10-40 mL Intracatheter Q12H   Continuous Infusions: . chlorproMAZINE (THORAZINE) IV    . HYDROmorphone 0.5 mg/hr (10/02/18 1033)     LOS: 14 days    Time spent: 40 minutes    Irine Seal, MD [facility]  If 7PM-7AM, please contact night-coverage [email or web address] 10-02-2018, 1:00 PM

## 2018-09-30 NOTE — Progress Notes (Signed)
30 ml Dilaudid wasted in stericycle with Barbie Haggis.

## 2018-09-30 DEATH — deceased

## 2018-10-17 ENCOUNTER — Other Ambulatory Visit: Payer: BC Managed Care – PPO

## 2018-10-20 ENCOUNTER — Telehealth: Payer: BC Managed Care – PPO | Admitting: Radiation Oncology

## 2018-12-11 ENCOUNTER — Telehealth: Payer: Self-pay | Admitting: Hematology and Oncology

## 2018-12-11 NOTE — Telephone Encounter (Signed)
FAXED RECORDS TO [facility] [employer]  RELEASE ID [id number]

## 2019-12-15 IMAGING — DX PORTABLE CHEST - 1 VIEW
1 series · 1 of 1 positions shown · non-contrast
Comparison: 06/11/2018 and earlier.

CLINICAL DATA: 58-year-old female with possible pneumonia in cancer
patient.

EXAM:
PORTABLE CHEST 1 VIEW

[chest ap]
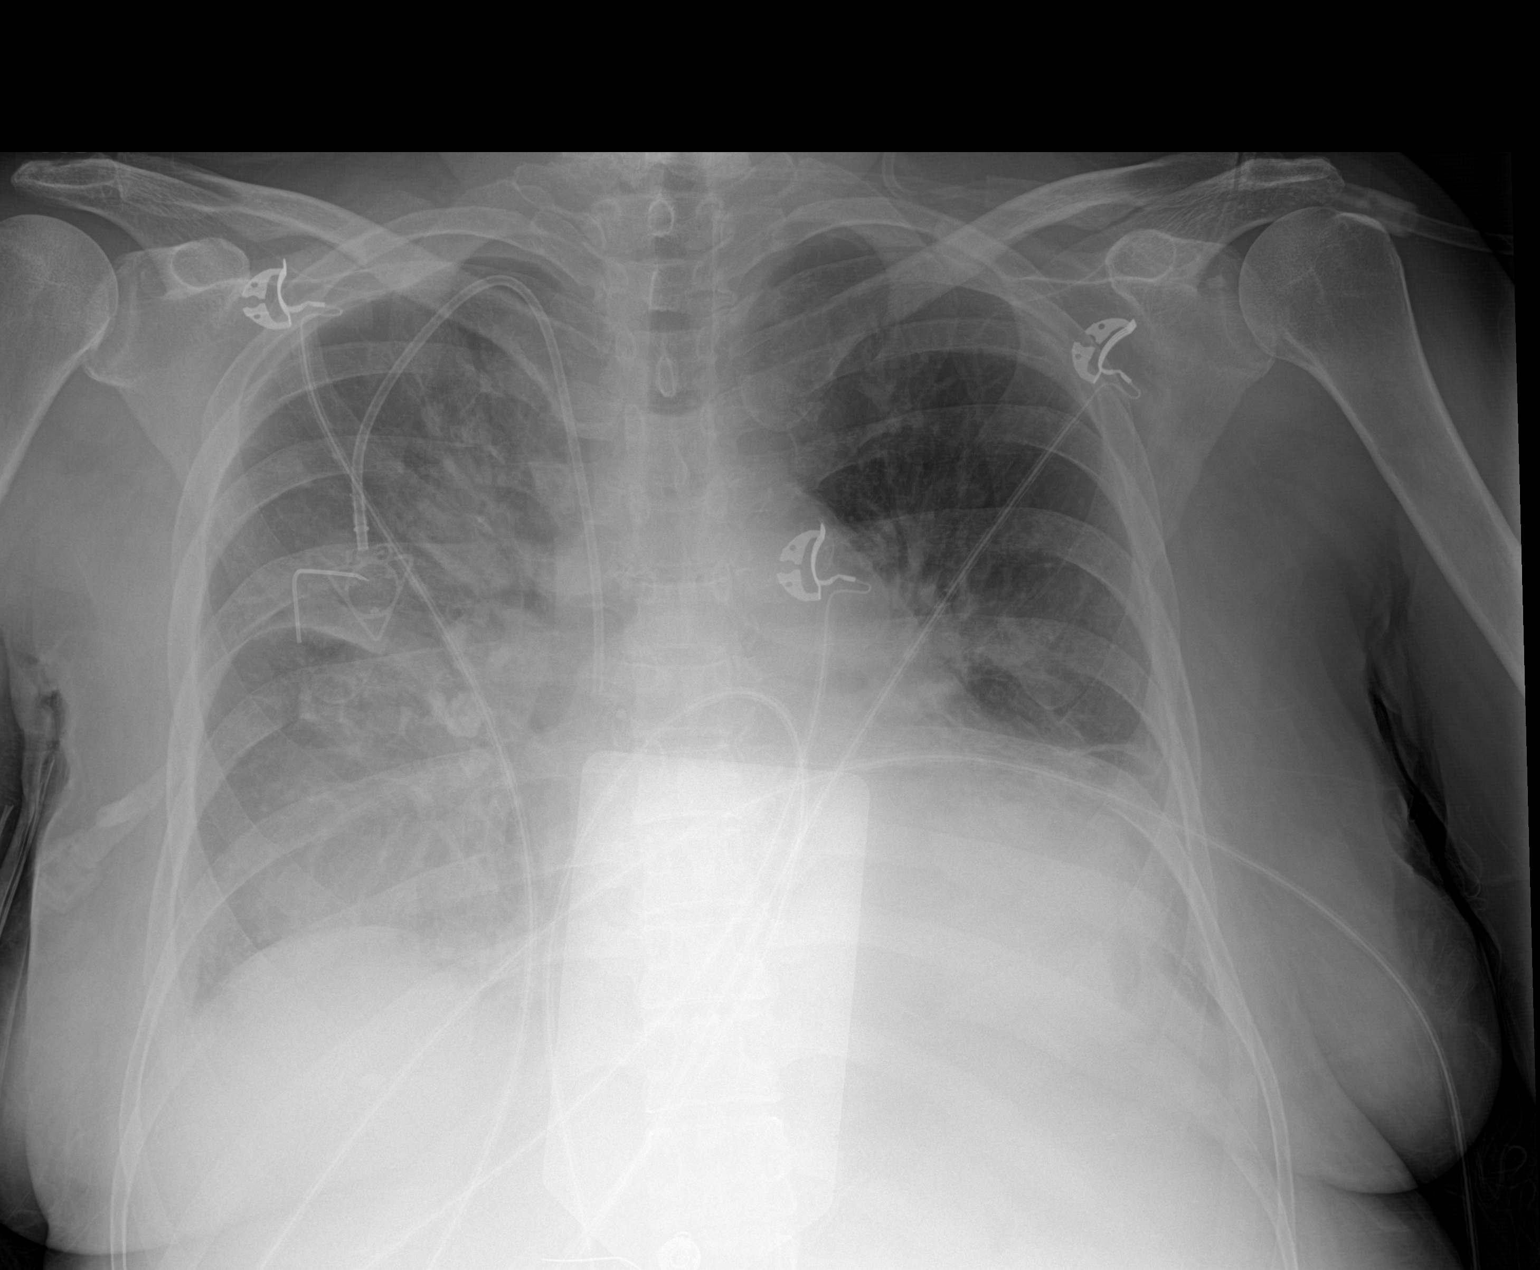

[1 of 1 positions shown; findings below may reference images not displayed]

FINDINGS: Portable AP semi upright view at 9142 hours. Stable right chest
power port, accessed. Increasing confluence of mid and lower lung
opacity. Consolidation demonstrated by CT yesterday. Trace pleural
fluid more apparent by CT. No superimposed pneumothorax or pulmonary
edema. Stable cardiac size and mediastinal contours. Visualized
tracheal air column is within normal limits.
IMPRESSION: Mild radiographic progression of bilateral consolidation/pneumonia
since yesterday.
